# Patient Record
Sex: Female | Born: 1950
Health system: Southern US, Community
[De-identification: ages and names within clinical notes are randomized; demographics above are authoritative.]

## PROBLEM LIST (undated history)

## (undated) DIAGNOSIS — G4730 Sleep apnea, unspecified: Secondary | ICD-10-CM

## (undated) DIAGNOSIS — E7800 Pure hypercholesterolemia, unspecified: Secondary | ICD-10-CM

## (undated) DIAGNOSIS — J189 Pneumonia, unspecified organism: Secondary | ICD-10-CM

## (undated) DIAGNOSIS — D126 Benign neoplasm of colon, unspecified: Secondary | ICD-10-CM

## (undated) DIAGNOSIS — S8992XA Unspecified injury of left lower leg, initial encounter: Secondary | ICD-10-CM

## (undated) DIAGNOSIS — I509 Heart failure, unspecified: Secondary | ICD-10-CM

## (undated) DIAGNOSIS — F419 Anxiety disorder, unspecified: Secondary | ICD-10-CM

## (undated) DIAGNOSIS — D649 Anemia, unspecified: Secondary | ICD-10-CM

## (undated) DIAGNOSIS — J449 Chronic obstructive pulmonary disease, unspecified: Secondary | ICD-10-CM

## (undated) DIAGNOSIS — Z9981 Dependence on supplemental oxygen: Secondary | ICD-10-CM

## (undated) DIAGNOSIS — J45909 Unspecified asthma, uncomplicated: Secondary | ICD-10-CM

## (undated) DIAGNOSIS — E119 Type 2 diabetes mellitus without complications: Secondary | ICD-10-CM

## (undated) DIAGNOSIS — I10 Essential (primary) hypertension: Secondary | ICD-10-CM

## (undated) HISTORY — DX: Heart failure, unspecified: I50.9

## (undated) HISTORY — DX: Pure hypercholesterolemia, unspecified: E78.00

## (undated) HISTORY — PX: CATARACT EXTRACTION: SUR2

## (undated) HISTORY — DX: Benign neoplasm of colon, unspecified: D12.6

## (undated) HISTORY — PX: JOINT REPLACEMENT: SHX530

## (undated) HISTORY — DX: Anxiety disorder, unspecified: F41.9

## (undated) HISTORY — PX: ABDOMINAL HYSTERECTOMY: SHX81

## (undated) HISTORY — DX: Unspecified injury of left lower leg, initial encounter: S89.92XA

## (undated) HISTORY — DX: Anemia, unspecified: D64.9

## (undated) HISTORY — DX: Pneumonia, unspecified organism: J18.9

---

## 2013-09-06 DIAGNOSIS — I10 Essential (primary) hypertension: Secondary | ICD-10-CM | POA: Diagnosis not present

## 2013-09-06 DIAGNOSIS — E559 Vitamin D deficiency, unspecified: Secondary | ICD-10-CM | POA: Diagnosis not present

## 2013-09-06 DIAGNOSIS — E1159 Type 2 diabetes mellitus with other circulatory complications: Secondary | ICD-10-CM | POA: Diagnosis not present

## 2013-09-06 DIAGNOSIS — K219 Gastro-esophageal reflux disease without esophagitis: Secondary | ICD-10-CM | POA: Diagnosis not present

## 2013-09-11 DIAGNOSIS — E559 Vitamin D deficiency, unspecified: Secondary | ICD-10-CM | POA: Diagnosis not present

## 2013-09-11 DIAGNOSIS — I10 Essential (primary) hypertension: Secondary | ICD-10-CM | POA: Diagnosis not present

## 2013-09-11 DIAGNOSIS — E1159 Type 2 diabetes mellitus with other circulatory complications: Secondary | ICD-10-CM | POA: Diagnosis not present

## 2013-09-11 DIAGNOSIS — K219 Gastro-esophageal reflux disease without esophagitis: Secondary | ICD-10-CM | POA: Diagnosis not present

## 2013-09-14 DIAGNOSIS — E559 Vitamin D deficiency, unspecified: Secondary | ICD-10-CM | POA: Diagnosis not present

## 2013-09-14 DIAGNOSIS — Z9109 Other allergy status, other than to drugs and biological substances: Secondary | ICD-10-CM | POA: Diagnosis not present

## 2013-09-18 DIAGNOSIS — R079 Chest pain, unspecified: Secondary | ICD-10-CM | POA: Diagnosis not present

## 2013-09-29 DIAGNOSIS — J301 Allergic rhinitis due to pollen: Secondary | ICD-10-CM | POA: Diagnosis not present

## 2013-09-29 DIAGNOSIS — K219 Gastro-esophageal reflux disease without esophagitis: Secondary | ICD-10-CM | POA: Diagnosis not present

## 2013-09-29 DIAGNOSIS — R062 Wheezing: Secondary | ICD-10-CM | POA: Diagnosis not present

## 2013-09-29 DIAGNOSIS — I10 Essential (primary) hypertension: Secondary | ICD-10-CM | POA: Diagnosis not present

## 2013-10-13 DIAGNOSIS — M1990 Unspecified osteoarthritis, unspecified site: Secondary | ICD-10-CM | POA: Diagnosis not present

## 2013-10-13 DIAGNOSIS — K219 Gastro-esophageal reflux disease without esophagitis: Secondary | ICD-10-CM | POA: Diagnosis not present

## 2013-10-13 DIAGNOSIS — I10 Essential (primary) hypertension: Secondary | ICD-10-CM | POA: Diagnosis not present

## 2013-10-26 DIAGNOSIS — E11319 Type 2 diabetes mellitus with unspecified diabetic retinopathy without macular edema: Secondary | ICD-10-CM | POA: Diagnosis not present

## 2013-10-26 DIAGNOSIS — E1139 Type 2 diabetes mellitus with other diabetic ophthalmic complication: Secondary | ICD-10-CM | POA: Diagnosis not present

## 2013-10-27 DIAGNOSIS — Z01419 Encounter for gynecological examination (general) (routine) without abnormal findings: Secondary | ICD-10-CM | POA: Diagnosis not present

## 2013-11-14 DIAGNOSIS — F458 Other somatoform disorders: Secondary | ICD-10-CM | POA: Diagnosis not present

## 2013-11-14 DIAGNOSIS — I10 Essential (primary) hypertension: Secondary | ICD-10-CM | POA: Diagnosis not present

## 2013-11-14 DIAGNOSIS — Z1211 Encounter for screening for malignant neoplasm of colon: Secondary | ICD-10-CM | POA: Diagnosis not present

## 2013-11-14 DIAGNOSIS — M1990 Unspecified osteoarthritis, unspecified site: Secondary | ICD-10-CM | POA: Diagnosis not present

## 2013-11-14 DIAGNOSIS — K219 Gastro-esophageal reflux disease without esophagitis: Secondary | ICD-10-CM | POA: Diagnosis not present

## 2013-11-14 DIAGNOSIS — Z800 Family history of malignant neoplasm of digestive organs: Secondary | ICD-10-CM | POA: Diagnosis not present

## 2013-11-14 DIAGNOSIS — E1159 Type 2 diabetes mellitus with other circulatory complications: Secondary | ICD-10-CM | POA: Diagnosis not present

## 2013-11-14 DIAGNOSIS — R1310 Dysphagia, unspecified: Secondary | ICD-10-CM | POA: Diagnosis not present

## 2013-11-20 DIAGNOSIS — E669 Obesity, unspecified: Secondary | ICD-10-CM | POA: Diagnosis not present

## 2013-11-20 DIAGNOSIS — G4710 Hypersomnia, unspecified: Secondary | ICD-10-CM | POA: Diagnosis not present

## 2013-11-20 DIAGNOSIS — R0609 Other forms of dyspnea: Secondary | ICD-10-CM | POA: Diagnosis not present

## 2013-11-20 DIAGNOSIS — R062 Wheezing: Secondary | ICD-10-CM | POA: Diagnosis not present

## 2013-11-20 DIAGNOSIS — R0989 Other specified symptoms and signs involving the circulatory and respiratory systems: Secondary | ICD-10-CM | POA: Diagnosis not present

## 2013-11-20 DIAGNOSIS — Z77120 Contact with and (suspected) exposure to mold (toxic): Secondary | ICD-10-CM | POA: Diagnosis not present

## 2013-11-20 DIAGNOSIS — R059 Cough, unspecified: Secondary | ICD-10-CM | POA: Diagnosis not present

## 2013-11-20 DIAGNOSIS — J45909 Unspecified asthma, uncomplicated: Secondary | ICD-10-CM | POA: Diagnosis not present

## 2013-11-20 DIAGNOSIS — R05 Cough: Secondary | ICD-10-CM | POA: Diagnosis not present

## 2013-11-28 DIAGNOSIS — M5137 Other intervertebral disc degeneration, lumbosacral region: Secondary | ICD-10-CM | POA: Diagnosis not present

## 2013-12-12 DIAGNOSIS — J45909 Unspecified asthma, uncomplicated: Secondary | ICD-10-CM | POA: Diagnosis not present

## 2013-12-12 LAB — PULMONARY FUNCTION TEST

## 2013-12-19 DIAGNOSIS — E119 Type 2 diabetes mellitus without complications: Secondary | ICD-10-CM | POA: Diagnosis not present

## 2013-12-19 DIAGNOSIS — E782 Mixed hyperlipidemia: Secondary | ICD-10-CM | POA: Diagnosis not present

## 2013-12-19 DIAGNOSIS — K219 Gastro-esophageal reflux disease without esophagitis: Secondary | ICD-10-CM | POA: Diagnosis not present

## 2013-12-19 DIAGNOSIS — K644 Residual hemorrhoidal skin tags: Secondary | ICD-10-CM | POA: Diagnosis not present

## 2013-12-19 DIAGNOSIS — Z8614 Personal history of Methicillin resistant Staphylococcus aureus infection: Secondary | ICD-10-CM | POA: Diagnosis not present

## 2013-12-19 DIAGNOSIS — Z1211 Encounter for screening for malignant neoplasm of colon: Secondary | ICD-10-CM | POA: Diagnosis not present

## 2013-12-19 DIAGNOSIS — J45909 Unspecified asthma, uncomplicated: Secondary | ICD-10-CM | POA: Diagnosis not present

## 2013-12-19 DIAGNOSIS — I10 Essential (primary) hypertension: Secondary | ICD-10-CM | POA: Diagnosis not present

## 2013-12-19 DIAGNOSIS — K620 Anal polyp: Secondary | ICD-10-CM | POA: Diagnosis not present

## 2013-12-19 DIAGNOSIS — D126 Benign neoplasm of colon, unspecified: Secondary | ICD-10-CM | POA: Diagnosis not present

## 2013-12-19 DIAGNOSIS — R1310 Dysphagia, unspecified: Secondary | ICD-10-CM | POA: Diagnosis not present

## 2013-12-19 DIAGNOSIS — K621 Rectal polyp: Secondary | ICD-10-CM | POA: Diagnosis not present

## 2013-12-19 DIAGNOSIS — Z23 Encounter for immunization: Secondary | ICD-10-CM | POA: Diagnosis not present

## 2013-12-19 DIAGNOSIS — Z6841 Body Mass Index (BMI) 40.0 and over, adult: Secondary | ICD-10-CM | POA: Diagnosis not present

## 2013-12-29 DIAGNOSIS — K621 Rectal polyp: Secondary | ICD-10-CM | POA: Diagnosis not present

## 2013-12-29 DIAGNOSIS — R1310 Dysphagia, unspecified: Secondary | ICD-10-CM | POA: Diagnosis not present

## 2013-12-29 DIAGNOSIS — R6889 Other general symptoms and signs: Secondary | ICD-10-CM | POA: Diagnosis not present

## 2013-12-29 DIAGNOSIS — K219 Gastro-esophageal reflux disease without esophagitis: Secondary | ICD-10-CM | POA: Diagnosis not present

## 2013-12-29 DIAGNOSIS — D126 Benign neoplasm of colon, unspecified: Secondary | ICD-10-CM | POA: Diagnosis not present

## 2013-12-29 DIAGNOSIS — K620 Anal polyp: Secondary | ICD-10-CM | POA: Diagnosis not present

## 2013-12-29 DIAGNOSIS — Z1211 Encounter for screening for malignant neoplasm of colon: Secondary | ICD-10-CM | POA: Diagnosis not present

## 2013-12-29 DIAGNOSIS — K644 Residual hemorrhoidal skin tags: Secondary | ICD-10-CM | POA: Diagnosis not present

## 2014-01-08 DIAGNOSIS — M5137 Other intervertebral disc degeneration, lumbosacral region: Secondary | ICD-10-CM | POA: Diagnosis not present

## 2014-01-12 DIAGNOSIS — Z1211 Encounter for screening for malignant neoplasm of colon: Secondary | ICD-10-CM | POA: Diagnosis not present

## 2014-01-12 DIAGNOSIS — D126 Benign neoplasm of colon, unspecified: Secondary | ICD-10-CM | POA: Diagnosis not present

## 2014-01-12 DIAGNOSIS — R1310 Dysphagia, unspecified: Secondary | ICD-10-CM | POA: Diagnosis not present

## 2014-01-12 DIAGNOSIS — K219 Gastro-esophageal reflux disease without esophagitis: Secondary | ICD-10-CM | POA: Diagnosis not present

## 2014-01-12 DIAGNOSIS — E1159 Type 2 diabetes mellitus with other circulatory complications: Secondary | ICD-10-CM | POA: Diagnosis not present

## 2014-01-12 DIAGNOSIS — E559 Vitamin D deficiency, unspecified: Secondary | ICD-10-CM | POA: Diagnosis not present

## 2014-01-12 DIAGNOSIS — K621 Rectal polyp: Secondary | ICD-10-CM | POA: Diagnosis not present

## 2014-01-12 DIAGNOSIS — I10 Essential (primary) hypertension: Secondary | ICD-10-CM | POA: Diagnosis not present

## 2014-01-12 DIAGNOSIS — K620 Anal polyp: Secondary | ICD-10-CM | POA: Diagnosis not present

## 2014-01-12 DIAGNOSIS — K644 Residual hemorrhoidal skin tags: Secondary | ICD-10-CM | POA: Diagnosis not present

## 2014-01-12 DIAGNOSIS — R6889 Other general symptoms and signs: Secondary | ICD-10-CM | POA: Diagnosis not present

## 2014-01-18 DIAGNOSIS — M5137 Other intervertebral disc degeneration, lumbosacral region: Secondary | ICD-10-CM | POA: Diagnosis not present

## 2014-01-22 DIAGNOSIS — E559 Vitamin D deficiency, unspecified: Secondary | ICD-10-CM | POA: Diagnosis not present

## 2014-01-22 DIAGNOSIS — E1159 Type 2 diabetes mellitus with other circulatory complications: Secondary | ICD-10-CM | POA: Diagnosis not present

## 2014-01-22 DIAGNOSIS — I10 Essential (primary) hypertension: Secondary | ICD-10-CM | POA: Diagnosis not present

## 2014-01-22 DIAGNOSIS — M5137 Other intervertebral disc degeneration, lumbosacral region: Secondary | ICD-10-CM | POA: Diagnosis not present

## 2014-01-29 DIAGNOSIS — Z043 Encounter for examination and observation following other accident: Secondary | ICD-10-CM | POA: Diagnosis not present

## 2014-01-29 DIAGNOSIS — M7989 Other specified soft tissue disorders: Secondary | ICD-10-CM | POA: Diagnosis not present

## 2014-01-29 DIAGNOSIS — M549 Dorsalgia, unspecified: Secondary | ICD-10-CM | POA: Diagnosis not present

## 2014-01-29 DIAGNOSIS — M25569 Pain in unspecified knee: Secondary | ICD-10-CM | POA: Diagnosis not present

## 2014-01-31 DIAGNOSIS — W19XXXS Unspecified fall, sequela: Secondary | ICD-10-CM | POA: Diagnosis not present

## 2014-01-31 DIAGNOSIS — I10 Essential (primary) hypertension: Secondary | ICD-10-CM | POA: Diagnosis not present

## 2014-01-31 DIAGNOSIS — S335XXA Sprain of ligaments of lumbar spine, initial encounter: Secondary | ICD-10-CM | POA: Diagnosis not present

## 2014-01-31 DIAGNOSIS — E1159 Type 2 diabetes mellitus with other circulatory complications: Secondary | ICD-10-CM | POA: Diagnosis not present

## 2014-01-31 DIAGNOSIS — M5137 Other intervertebral disc degeneration, lumbosacral region: Secondary | ICD-10-CM | POA: Diagnosis not present

## 2014-01-31 DIAGNOSIS — IMO0002 Reserved for concepts with insufficient information to code with codable children: Secondary | ICD-10-CM | POA: Diagnosis not present

## 2014-10-23 ENCOUNTER — Other Ambulatory Visit: Payer: Self-pay

## 2014-10-23 DIAGNOSIS — E041 Nontoxic single thyroid nodule: Secondary | ICD-10-CM

## 2014-10-24 ENCOUNTER — Other Ambulatory Visit: Payer: Self-pay

## 2014-10-24 DIAGNOSIS — E049 Nontoxic goiter, unspecified: Secondary | ICD-10-CM

## 2014-11-22 DIAGNOSIS — G43709 Chronic migraine without aura, not intractable, without status migrainosus: Secondary | ICD-10-CM | POA: Diagnosis not present

## 2014-11-25 ENCOUNTER — Emergency Department (HOSPITAL_COMMUNITY): Payer: Medicare Other

## 2014-11-25 ENCOUNTER — Encounter (HOSPITAL_COMMUNITY): Payer: Self-pay | Admitting: Emergency Medicine

## 2014-11-25 ENCOUNTER — Inpatient Hospital Stay (HOSPITAL_COMMUNITY)
Admission: EM | Admit: 2014-11-25 | Discharge: 2014-12-02 | DRG: 202 | Disposition: A | Discharge status: Home / Self Care | Payer: Medicare Other | Attending: Internal Medicine | Admitting: Internal Medicine

## 2014-11-25 DIAGNOSIS — R7989 Other specified abnormal findings of blood chemistry: Secondary | ICD-10-CM

## 2014-11-25 DIAGNOSIS — E1310 Other specified diabetes mellitus with ketoacidosis without coma: Secondary | ICD-10-CM | POA: Diagnosis not present

## 2014-11-25 DIAGNOSIS — E662 Morbid (severe) obesity with alveolar hypoventilation: Secondary | ICD-10-CM | POA: Diagnosis present

## 2014-11-25 DIAGNOSIS — I10 Essential (primary) hypertension: Secondary | ICD-10-CM | POA: Diagnosis not present

## 2014-11-25 DIAGNOSIS — R6510 Systemic inflammatory response syndrome (SIRS) of non-infectious origin without acute organ dysfunction: Secondary | ICD-10-CM | POA: Diagnosis present

## 2014-11-25 DIAGNOSIS — R0602 Shortness of breath: Secondary | ICD-10-CM | POA: Diagnosis not present

## 2014-11-25 DIAGNOSIS — E1122 Type 2 diabetes mellitus with diabetic chronic kidney disease: Secondary | ICD-10-CM | POA: Diagnosis present

## 2014-11-25 DIAGNOSIS — G8929 Other chronic pain: Secondary | ICD-10-CM | POA: Diagnosis present

## 2014-11-25 DIAGNOSIS — M542 Cervicalgia: Secondary | ICD-10-CM

## 2014-11-25 DIAGNOSIS — M545 Low back pain: Secondary | ICD-10-CM | POA: Diagnosis present

## 2014-11-25 DIAGNOSIS — J810 Acute pulmonary edema: Secondary | ICD-10-CM | POA: Diagnosis not present

## 2014-11-25 DIAGNOSIS — Z79891 Long term (current) use of opiate analgesic: Secondary | ICD-10-CM

## 2014-11-25 DIAGNOSIS — R6520 Severe sepsis without septic shock: Secondary | ICD-10-CM

## 2014-11-25 DIAGNOSIS — Z79899 Other long term (current) drug therapy: Secondary | ICD-10-CM | POA: Diagnosis not present

## 2014-11-25 DIAGNOSIS — E781 Pure hyperglyceridemia: Secondary | ICD-10-CM | POA: Diagnosis present

## 2014-11-25 DIAGNOSIS — N179 Acute kidney failure, unspecified: Secondary | ICD-10-CM | POA: Diagnosis not present

## 2014-11-25 DIAGNOSIS — Z6841 Body Mass Index (BMI) 40.0 and over, adult: Secondary | ICD-10-CM

## 2014-11-25 DIAGNOSIS — N189 Chronic kidney disease, unspecified: Secondary | ICD-10-CM

## 2014-11-25 DIAGNOSIS — I129 Hypertensive chronic kidney disease with stage 1 through stage 4 chronic kidney disease, or unspecified chronic kidney disease: Secondary | ICD-10-CM | POA: Diagnosis present

## 2014-11-25 DIAGNOSIS — N182 Chronic kidney disease, stage 2 (mild): Secondary | ICD-10-CM | POA: Diagnosis present

## 2014-11-25 DIAGNOSIS — E1165 Type 2 diabetes mellitus with hyperglycemia: Secondary | ICD-10-CM | POA: Diagnosis not present

## 2014-11-25 DIAGNOSIS — R05 Cough: Secondary | ICD-10-CM | POA: Diagnosis not present

## 2014-11-25 DIAGNOSIS — E872 Acidosis: Secondary | ICD-10-CM | POA: Diagnosis present

## 2014-11-25 DIAGNOSIS — R509 Fever, unspecified: Secondary | ICD-10-CM | POA: Diagnosis not present

## 2014-11-25 DIAGNOSIS — G4733 Obstructive sleep apnea (adult) (pediatric): Secondary | ICD-10-CM | POA: Diagnosis present

## 2014-11-25 DIAGNOSIS — I248 Other forms of acute ischemic heart disease: Secondary | ICD-10-CM | POA: Diagnosis present

## 2014-11-25 DIAGNOSIS — J189 Pneumonia, unspecified organism: Secondary | ICD-10-CM | POA: Diagnosis present

## 2014-11-25 DIAGNOSIS — Z90710 Acquired absence of both cervix and uterus: Secondary | ICD-10-CM | POA: Diagnosis not present

## 2014-11-25 DIAGNOSIS — A419 Sepsis, unspecified organism: Secondary | ICD-10-CM | POA: Diagnosis present

## 2014-11-25 DIAGNOSIS — R778 Other specified abnormalities of plasma proteins: Secondary | ICD-10-CM | POA: Diagnosis present

## 2014-11-25 DIAGNOSIS — J9811 Atelectasis: Secondary | ICD-10-CM | POA: Diagnosis present

## 2014-11-25 DIAGNOSIS — J45901 Unspecified asthma with (acute) exacerbation: Principal | ICD-10-CM | POA: Diagnosis present

## 2014-11-25 DIAGNOSIS — I5033 Acute on chronic diastolic (congestive) heart failure: Secondary | ICD-10-CM | POA: Diagnosis not present

## 2014-11-25 DIAGNOSIS — R1909 Other intra-abdominal and pelvic swelling, mass and lump: Secondary | ICD-10-CM | POA: Diagnosis not present

## 2014-11-25 DIAGNOSIS — Z794 Long term (current) use of insulin: Secondary | ICD-10-CM

## 2014-11-25 DIAGNOSIS — I517 Cardiomegaly: Secondary | ICD-10-CM | POA: Diagnosis not present

## 2014-11-25 DIAGNOSIS — J811 Chronic pulmonary edema: Secondary | ICD-10-CM

## 2014-11-25 DIAGNOSIS — M5032 Other cervical disc degeneration, mid-cervical region: Secondary | ICD-10-CM | POA: Diagnosis not present

## 2014-11-25 DIAGNOSIS — J80 Acute respiratory distress syndrome: Secondary | ICD-10-CM | POA: Diagnosis not present

## 2014-11-25 DIAGNOSIS — R1011 Right upper quadrant pain: Secondary | ICD-10-CM

## 2014-11-25 DIAGNOSIS — R0603 Acute respiratory distress: Secondary | ICD-10-CM | POA: Diagnosis present

## 2014-11-25 DIAGNOSIS — R609 Edema, unspecified: Secondary | ICD-10-CM | POA: Diagnosis not present

## 2014-11-25 DIAGNOSIS — I509 Heart failure, unspecified: Secondary | ICD-10-CM

## 2014-11-25 DIAGNOSIS — E785 Hyperlipidemia, unspecified: Secondary | ICD-10-CM | POA: Diagnosis present

## 2014-11-25 HISTORY — DX: Essential (primary) hypertension: I10

## 2014-11-25 HISTORY — DX: Type 2 diabetes mellitus without complications: E11.9

## 2014-11-25 HISTORY — DX: Unspecified asthma, uncomplicated: J45.909

## 2014-11-25 LAB — BASIC METABOLIC PANEL
Anion gap: 10 (ref 5–15)
BUN: 16 mg/dL (ref 6–20)
CHLORIDE: 102 mmol/L (ref 101–111)
CO2: 24 mmol/L (ref 22–32)
Calcium: 9.5 mg/dL (ref 8.9–10.3)
Creatinine, Ser: 1.49 mg/dL — ABNORMAL HIGH (ref 0.44–1.00)
GFR calc non Af Amer: 36 mL/min — ABNORMAL LOW (ref >60)
GFR, EST AFRICAN AMERICAN: 42 mL/min — AB (ref >60)
Glucose, Bld: 344 mg/dL — ABNORMAL HIGH (ref 65–99)
Potassium: 3.8 mmol/L (ref 3.5–5.1)
Sodium: 136 mmol/L (ref 135–145)

## 2014-11-25 LAB — CBC
HCT: 41.2 % (ref 36.0–46.0)
HEMOGLOBIN: 13.1 g/dL (ref 12.0–15.0)
MCH: 27.2 pg (ref 26.0–34.0)
MCHC: 31.8 g/dL (ref 30.0–36.0)
MCV: 85.5 fL (ref 78.0–100.0)
PLATELETS: 207 10*3/uL (ref 150–400)
RBC: 4.82 MIL/uL (ref 3.87–5.11)
RDW: 14.5 % (ref 11.5–15.5)
WBC: 7.8 10*3/uL (ref 4.0–10.5)

## 2014-11-25 LAB — URINALYSIS, ROUTINE W REFLEX MICROSCOPIC
Bilirubin Urine: NEGATIVE
Glucose, UA: >1000 mg/dL — AB
Hgb urine dipstick: NEGATIVE
Ketones, ur: 15 mg/dL — AB
LEUKOCYTES UA: NEGATIVE
Nitrite: NEGATIVE
PROTEIN: NEGATIVE mg/dL
Specific Gravity, Urine: 1.035 — ABNORMAL HIGH (ref 1.005–1.030)
Urobilinogen, UA: 1 mg/dL (ref 0.0–1.0)
pH: 5.5 (ref 5.0–8.0)

## 2014-11-25 LAB — I-STAT ARTERIAL BLOOD GAS, ED
Acid-Base Excess: 1 mmol/L (ref 0.0–2.0)
Bicarbonate: 24.4 mEq/L — ABNORMAL HIGH (ref 20.0–24.0)
O2 SAT: 96 %
Patient temperature: 99.4
TCO2: 25 mmol/L (ref 0–100)
pCO2 arterial: 36.9 mmHg (ref 35.0–45.0)
pH, Arterial: 7.431 (ref 7.350–7.450)
pO2, Arterial: 78 mmHg — ABNORMAL LOW (ref 80.0–100.0)

## 2014-11-25 LAB — I-STAT TROPONIN, ED: Troponin i, poc: 0 ng/mL (ref 0.00–0.08)

## 2014-11-25 LAB — TROPONIN I

## 2014-11-25 LAB — URINE MICROSCOPIC-ADD ON: Urine-Other: NONE SEEN

## 2014-11-25 LAB — I-STAT CG4 LACTIC ACID, ED
LACTIC ACID, VENOUS: 2.79 mmol/L — AB (ref 0.5–2.0)
LACTIC ACID, VENOUS: 2.69 mmol/L — AB (ref 0.5–2.0)

## 2014-11-25 LAB — BRAIN NATRIURETIC PEPTIDE: B NATRIURETIC PEPTIDE 5: 22.9 pg/mL (ref 0.0–100.0)

## 2014-11-25 MED ORDER — LEVOFLOXACIN IN D5W 500 MG/100ML IV SOLN
500.0000 mg | Freq: Once | INTRAVENOUS | Status: AC
  Administered 2014-11-25: 500 mg via INTRAVENOUS
  Filled 2014-11-25: qty 100

## 2014-11-25 MED ORDER — SODIUM CHLORIDE 0.9 % IV BOLUS (SEPSIS)
1000.0000 mL | Freq: Once | INTRAVENOUS | Status: AC
  Administered 2014-11-25: 1000 mL via INTRAVENOUS

## 2014-11-25 MED ORDER — MAGNESIUM SULFATE 2 GM/50ML IV SOLN
2.0000 g | Freq: Once | INTRAVENOUS | Status: AC
  Administered 2014-11-26: 2 g via INTRAVENOUS
  Filled 2014-11-25: qty 50

## 2014-11-25 MED ORDER — LEVALBUTEROL HCL 1.25 MG/0.5ML IN NEBU
1.2500 mg | INHALATION_SOLUTION | Freq: Once | RESPIRATORY_TRACT | Status: AC
  Administered 2014-11-25: 1.25 mg via RESPIRATORY_TRACT
  Filled 2014-11-25: qty 0.5

## 2014-11-25 MED ORDER — IPRATROPIUM-ALBUTEROL 0.5-2.5 (3) MG/3ML IN SOLN
3.0000 mL | Freq: Once | RESPIRATORY_TRACT | Status: AC
  Administered 2014-11-25: 3 mL via RESPIRATORY_TRACT
  Filled 2014-11-25: qty 3

## 2014-11-25 MED ORDER — METHYLPREDNISOLONE SODIUM SUCC 125 MG IJ SOLR
125.0000 mg | Freq: Once | INTRAMUSCULAR | Status: AC
  Administered 2014-11-25: 125 mg via INTRAVENOUS
  Filled 2014-11-25: qty 2

## 2014-11-25 MED ORDER — ALBUTEROL (5 MG/ML) CONTINUOUS INHALATION SOLN
10.0000 mg/h | INHALATION_SOLUTION | RESPIRATORY_TRACT | Status: DC
  Administered 2014-11-25: 10 mg/h via RESPIRATORY_TRACT
  Filled 2014-11-25: qty 20

## 2014-11-25 NOTE — ED Notes (Signed)
NOTIFIED DR. RANCOU IN PERSON FOR PATIENTS LAB RESULTS OF CG4+LACTIC ACID @18 :38 PM ,11/25/2014.

## 2014-11-25 NOTE — ED Provider Notes (Signed)
CSN: [id number]     Arrival date & time 11/25/14  1753 History   First MD Initiated Contact with Patient 11/25/14 1858     Chief Complaint  Patient presents with  . Cough  . Shortness of Breath  . Fever     (Consider location/radiation/quality/duration/timing/severity/associated sxs/prior Treatment) HPI Comments: Patient reports three-day history of cough, shortness of breath, subjective fever. She has a history of asthma, diabetes, hypertension and CHF. She recently relocated from [place]. She denies any chest pain, abdominal pain, nausea or vomiting. She does not smoke. She reports that she been having difficulty breathing, rhinorrhea, congestion. Cough is productive of green-yellow mucus. She has not had any inhalers to use at home. She denies any recent travel or sick contacts. She reports being hospitalized for her breathing in the past.  Patient is a 64 y.o. female presenting with shortness of breath and fever. The history is provided by the patient. The history is limited by the condition of the patient.  Shortness of Breath Associated symptoms: cough and fever   Associated symptoms: no abdominal pain, no headaches, no rash and no vomiting   Fever Associated symptoms: congestion, cough and rhinorrhea   Associated symptoms: no dysuria, no headaches, no myalgias, no nausea, no rash and no vomiting     Past Medical History  Diagnosis Date  . Asthma   . Hypertension   . Diabetes mellitus without complication    Past Surgical History  Procedure Laterality Date  . Abdominal hysterectomy     Family History  Problem Relation Age of Onset  . Colon cancer Father   . Diabetes type II Sister   . Diabetes type II Brother   . Colon cancer Sister    History  Substance Use Topics  . Smoking status: Never Smoker   . Smokeless tobacco: Not on file  . Alcohol Use: No   OB History    No data available     Review of Systems  Constitutional: Positive for fever, activity change  and appetite change.  HENT: Positive for congestion and rhinorrhea.   Respiratory: Positive for cough, chest tightness and shortness of breath.   Gastrointestinal: Negative for nausea, vomiting and abdominal pain.  Genitourinary: Negative for dysuria, hematuria, vaginal bleeding and vaginal discharge.  Musculoskeletal: Negative for myalgias and arthralgias.  Skin: Negative for rash.  Neurological: Positive for weakness. Negative for dizziness and headaches.  A complete 10 system review of systems was obtained and all systems are negative except as noted in the HPI and PMH.      Allergies  Review of patient's allergies indicates no known allergies.  Home Medications   Prior to Admission medications   Medication Sig Start Date End Date Taking? Authorizing Provider  albuterol (PROVENTIL HFA;VENTOLIN HFA) 108 (90 BASE) MCG/ACT inhaler Inhale 2 puffs into the lungs every 6 (six) hours as needed for wheezing or shortness of breath.    Yes Historical Provider, MD  aspirin 81 MG chewable tablet 81 mg daily.   Yes Historical Provider, MD  atenolol (TENORMIN) 50 MG tablet Take 50 mg by mouth 2 (two) times daily. 11/15/14  Yes Historical Provider, MD  atorvastatin (LIPITOR) 40 MG tablet Take 40 mg by mouth daily at 6 PM.  09/07/14  Yes Historical Provider, MD  benzonatate (TESSALON) 100 MG capsule Take 100 mg by mouth daily as needed for cough.  11/22/14  Yes Historical Provider, MD  bumetanide (BUMEX) 0.5 MG tablet Take 0.5 mg by mouth daily.   Yes  Historical Provider, MD  calcium-vitamin D (OSCAL WITH D) 500-200 MG-UNIT per tablet Take 1 tablet by mouth daily. 03/02/14  Yes Historical Provider, MD  Fluticasone-Salmeterol (ADVAIR) 250-50 MCG/DOSE AEPB Inhale 1 puff into the lungs 2 (two) times daily. 10/15/14  Yes Historical Provider, MD  guaifenesin (ROBITUSSIN) 100 MG/5ML syrup Take 200 mg by mouth 3 (three) times daily as needed for cough.   Yes Historical Provider, MD  HUMULIN 70/30 (70-30) 100  UNIT/ML injection Inject 75 Units into the skin 3 (three) times daily. 11/05/14  Yes Historical Provider, MD  hydrochlorothiazide (HYDRODIURIL) 25 MG tablet Take 25 mg by mouth daily. 11/01/14  Yes Historical Provider, MD  HYDROcodone-acetaminophen (NORCO) 7.5-325 MG per tablet Take 1 tablet by mouth every 6 (six) hours as needed for moderate pain or severe pain.  11/19/14  Yes Historical Provider, MD  lisinopril (PRINIVIL,ZESTRIL) 5 MG tablet Take 5 mg by mouth daily. 11/01/14  Yes Historical Provider, MD  loratadine (CLARITIN) 10 MG tablet Take 10 mg by mouth daily. 10/10/14  Yes Historical Provider, MD  montelukast (SINGULAIR) 10 MG tablet Take 10 mg by mouth daily. 11/01/14  Yes Historical Provider, MD  nitroGLYCERIN (NITROSTAT) 0.3 MG SL tablet Place 0.3 mg under the tongue every 5 (five) minutes as needed for chest pain.    Yes Historical Provider, MD  omega-3 acid ethyl esters (LOVAZA) 1 G capsule Take 1,000 mg by mouth 2 (two) times daily.   Yes Historical Provider, MD  omeprazole (PRILOSEC) 40 MG capsule Take 40 mg by mouth daily. 10/30/14  Yes Historical Provider, MD  potassium chloride (K-DUR) 10 MEQ tablet Take 10 mEq by mouth daily.  07/30/14  Yes Historical Provider, MD  topiramate (TOPAMAX) 50 MG tablet Take 50 mg by mouth 2 (two) times daily. 11/01/14  Yes Historical Provider, MD   BP 132/68 mmHg  Pulse 118  Temp(Src) 98.8 F (37.1 C) (Oral)  Resp 35  Ht 5\' 3"  (1.6 m)  Wt 261 lb 14.5 oz (118.8 kg)  BMI 46.41 kg/m2  SpO2 93% Physical Exam  Constitutional: She is oriented to person, place, and time. She appears well-developed and well-nourished. She appears distressed.  Mildly increased work of breathing  HENT:  Head: Normocephalic and atraumatic.  Mouth/Throat: Oropharynx is clear and moist. No oropharyngeal exudate.  Eyes: Conjunctivae and EOM are normal. Pupils are equal, round, and reactive to light.  Neck: Normal range of motion. Neck supple.  No meningismus.  Cardiovascular:  Normal rate, regular rhythm, normal heart sounds and intact distal pulses.   No murmur heard. Pulmonary/Chest: She is in respiratory distress. She has wheezes.  Tachypnea, expiratory wheezing bilaterally Decreased breath sounds  Abdominal: Soft. There is no tenderness. There is no rebound and no guarding.  Musculoskeletal: Normal range of motion. She exhibits no edema or tenderness.  Neurological: She is alert and oriented to person, place, and time. No cranial nerve deficit. She exhibits normal muscle tone. Coordination normal.  No ataxia on finger to nose bilaterally. No pronator drift. 5/5 strength throughout. CN 2-12 intact. Negative Romberg. Equal grip strength. Sensation intact. Gait is normal.   Skin: Skin is warm.  Psychiatric: She has a normal mood and affect. Her behavior is normal.  Nursing note and vitals reviewed.   ED Course  Procedures (including critical care time) Labs Review Labs Reviewed  BASIC METABOLIC PANEL - Abnormal; Notable for the following:    Glucose, Bld 344 (*)    Creatinine, Ser 1.49 (*)    GFR calc non  Af Amer 36 (*)    GFR calc Af Amer 42 (*)    All other components within normal limits  URINALYSIS, ROUTINE W REFLEX MICROSCOPIC - Abnormal; Notable for the following:    Specific Gravity, Urine 1.035 (*)    Glucose, UA >1000 (*)    Ketones, ur 15 (*)    All other components within normal limits  I-STAT CG4 LACTIC ACID, ED - Abnormal; Notable for the following:    Lactic Acid, Venous 2.79 (*)    All other components within normal limits  I-STAT ARTERIAL BLOOD GAS, ED - Abnormal; Notable for the following:    pO2, Arterial 78.0 (*)    Bicarbonate 24.4 (*)    All other components within normal limits  I-STAT CG4 LACTIC ACID, ED - Abnormal; Notable for the following:    Lactic Acid, Venous 2.69 (*)    All other components within normal limits  I-STAT CG4 LACTIC ACID, ED - Abnormal; Notable for the following:    Lactic Acid, Venous 5.40 (*)    All  other components within normal limits  URINE CULTURE  CULTURE, BLOOD (ROUTINE X 2)  CULTURE, BLOOD (ROUTINE X 2)  CULTURE, EXPECTORATED SPUTUM-ASSESSMENT  CBC  TROPONIN I  BRAIN NATRIURETIC PEPTIDE  URINE MICROSCOPIC-ADD ON  D-DIMER, QUANTITATIVE  PROCALCITONIN  INFLUENZA PANEL BY PCR (TYPE A & B, H1N1)  LACTIC ACID, PLASMA  MAGNESIUM  PHOSPHORUS  TSH  COMPREHENSIVE METABOLIC PANEL  CBC  TROPONIN I  TROPONIN I  TROPONIN I  I-STAT TROPOININ, ED    Imaging Review Dg Chest 2 View (if Patient Has Fever And/or Copd)  11/25/2014   CLINICAL DATA:  Patient with cough and congestion as well as fever for multiple days.  EXAM: CHEST  2 VIEW  COMPARISON:  None.  FINDINGS: Cardiac and mediastinal contours are enlarged. No consolidative pulmonary opacities. No pleural effusion or pneumothorax. Regional skeleton is unremarkable.  IMPRESSION: Cardiomegaly.  No acute cardiopulmonary process.   Electronically Signed   By: Lovey Newcomer M.D.   On: 11/25/2014 18:30     EKG Interpretation None      MDM   Final diagnoses:  Asthma exacerbation   Patient presents with cough and respiratory distress and difficulty breathing for the past 3 days. She is wheezing and tachypnea, exam.  Nebulizers and steroids are ordered. Magnesium 2g IV.  Chest x-ray shows cardiomegaly no pneumonia or acute process. Remains tachypneic, tachycardic. Not hypoxic. ABG without CO2 retention.  Will not start bipap at this time. Lactate elevated.  IVF given.  Levaquin given.  Unknown EF with reported history of CHF. Afebrile.  D-dimer negative. Suspect asthma exacerbation with possible viral bronchitis.  Still in respiratory distress, moving better air, wheezing throughout. Will need admission. Stepdown admission d/w Dr. Roel Cluck.  She has asked critical care to consult.   CRITICAL CARE Performed by: Ezequiel Essex Total critical care time:60 Critical care time was exclusive of separately billable procedures and  treating other patients. Critical care was necessary to treat or prevent imminent or life-threatening deterioration. Critical care was time spent personally by me on the following activities: development of treatment plan with patient and/or surrogate as well as nursing, discussions with consultants, evaluation of patient's response to treatment, examination of patient, obtaining history from patient or surrogate, ordering and performing treatments and interventions, ordering and review of laboratory studies, ordering and review of radiographic studies, pulse oximetry and re-evaluation of patient's condition.    Ezequiel Essex, MD 11/26/14 [phone]

## 2014-11-25 NOTE — ED Notes (Signed)
RT spoke with MD in regards to ABG results below and placing patient on BiPAP based on the results. Patient was wheezing while RT was in room obtaining ABG .  MD agreed to try a continuous neb treatment first. RT will continue to monitor.   Results for Heidi [name], Heidi [name] (MRN [id number]) as of 11/25/2014 20:43  Ref. Range 11/25/2014 20:36  Sample type Unknown ARTERIAL  pH, Arterial Latest Ref Range: 7.350-7.450  7.431  pCO2 arterial Latest Ref Range: 35.0-45.0 mmHg 36.9  pO2, Arterial Latest Ref Range: 80.0-100.0 mmHg 78.0 (L)  Bicarbonate Latest Ref Range: 20.0-24.0 mEq/L 24.4 (H)  TCO2 Latest Ref Range: 0-100 mmol/L 25  Acid-Base Excess Latest Ref Range: 0.0-2.0 mmol/L 1.0  O2 Saturation Latest Units: % 96.0  Patient temperature Unknown 99.4 F  Collection site Unknown RADIAL, [facility] T.Marland KitchenMarland Kitchen

## 2014-11-25 NOTE — ED Notes (Signed)
[facility] notified to call Code Sepsis Level 1

## 2014-11-25 NOTE — ED Notes (Signed)
Pt placed on 2L O2 

## 2014-11-25 NOTE — ED Notes (Signed)
Pt c/o cough, shortness of breath and fever onset Friday. Pt has tried OTC medications and prescribed medications without relief. Pt talking in near complete sentences.

## 2014-11-25 NOTE — H&P (Addendum)
PCP: Patient moved to [facility] in the beginning of April haven't gotten a PCP  Referring provider [place]   Chief Complaint:  Wheezing shortness of breath  HPI: Heidi [name] is a 64 y.o. female   has a past medical history of Asthma; Hypertension; and Diabetes mellitus without complication.   Presented with worsening cough shortness of breath patient endorses fever up to 100.5.She has been coughing for the past 3 days. She had to drive back to [place] few days ago and was seen by her PCP. He prescribed tessalon pearls.  Has history of asthma and takes albuterol as needed. Does not have Nebulizer at home. History of heart failure on Bumex  and diabetes on insulin.  Patient afebrile in emergency department lactic acid noted to be elevated at 2.69, patient is tachycardic and tachypneic was treated with albuterol nebulizer and IV Solu-Medrol with some response but continues to be severely short of breath d-dimer has been ordered and currently pending chest x-ray showing cardiomegaly but no acute findings. In ER she met sepsis criteria with tachypnea, tachycardia and hx of fever up to 100.5  Hospitalist was called for admission for likely asthma exacerbation and possibly viral illness  Review of Systems:    Pertinent positives include: Fevers, chills, shortness of breath at rest. wheezing.productive cough,  Constitutional:  No weight loss, night sweats, , fatigue, weight loss,  HEENT:  No headaches, Difficulty swallowing,Tooth/dental problems,Sore throat,  No sneezing, itching, ear ache, nasal congestion, post nasal drip,  Cardio-vascular:  No chest pain, Orthopnea, PND, anasarca, dizziness, palpitations.no Bilateral lower extremity swelling  GI:  No heartburn, indigestion, abdominal pain, nausea, vomiting, diarrhea, change in bowel habits, loss of appetite, melena, blood in stool, hematemesis Resp:  no  No dyspnea on exertion, No excess mucus,   No non-productive cough, No coughing  up of blood.No change in color of mucus.No  Skin:  no rash or lesions. No jaundice GU:  no dysuria, change in color of urine, no urgency or frequency. No straining to urinate.  No flank pain.  Musculoskeletal:  No joint pain or no joint swelling. No decreased range of motion. No back pain.  Psych:  No change in mood or affect. No depression or anxiety. No memory loss.  Neuro: no localizing neurological complaints, no tingling, no weakness, no double vision, no gait abnormality, no slurred speech, no confusion  Otherwise ROS are negative except for above, 10 systems were reviewed  Past Medical History: Past Medical History  Diagnosis Date  . Asthma   . Hypertension   . Diabetes mellitus without complication    Past Surgical History  Procedure Laterality Date  . Abdominal hysterectomy       Medications: Prior to Admission medications   Medication Sig Start Date End Date Taking? Authorizing Provider  albuterol (PROVENTIL HFA;VENTOLIN HFA) 108 (90 BASE) MCG/ACT inhaler Inhale 2 puffs into the lungs every 6 (six) hours as needed for wheezing or shortness of breath.    Yes Historical Provider, MD  aspirin 81 MG chewable tablet 81 mg daily.   Yes Historical Provider, MD  atenolol (TENORMIN) 50 MG tablet Take 50 mg by mouth 2 (two) times daily. 11/15/14  Yes Historical Provider, MD  atorvastatin (LIPITOR) 40 MG tablet Take 40 mg by mouth daily at 6 PM.  09/07/14  Yes Historical Provider, MD  benzonatate (TESSALON) 100 MG capsule Take 100 mg by mouth daily as needed for cough.  11/22/14  Yes Historical Provider, MD  bumetanide (BUMEX) 0.5 MG tablet  Take 0.5 mg by mouth daily.   Yes Historical Provider, MD  calcium-vitamin D (OSCAL WITH D) 500-200 MG-UNIT per tablet Take 1 tablet by mouth daily. 03/02/14  Yes Historical Provider, MD  Fluticasone-Salmeterol (ADVAIR) 250-50 MCG/DOSE AEPB Inhale 1 puff into the lungs 2 (two) times daily. 10/15/14  Yes Historical Provider, MD  guaifenesin  (ROBITUSSIN) 100 MG/5ML syrup Take 200 mg by mouth 3 (three) times daily as needed for cough.   Yes Historical Provider, MD  HUMULIN 70/30 (70-30) 100 UNIT/ML injection Inject 75 Units into the skin 3 (three) times daily. 11/05/14  Yes Historical Provider, MD  hydrochlorothiazide (HYDRODIURIL) 25 MG tablet Take 25 mg by mouth daily. 11/01/14  Yes Historical Provider, MD  HYDROcodone-acetaminophen (NORCO) 7.5-325 MG per tablet Take 1 tablet by mouth every 6 (six) hours as needed for moderate pain or severe pain.  11/19/14  Yes Historical Provider, MD  lisinopril (PRINIVIL,ZESTRIL) 5 MG tablet Take 5 mg by mouth daily. 11/01/14  Yes Historical Provider, MD  loratadine (CLARITIN) 10 MG tablet Take 10 mg by mouth daily. 10/10/14  Yes Historical Provider, MD  montelukast (SINGULAIR) 10 MG tablet Take 10 mg by mouth daily. 11/01/14  Yes Historical Provider, MD  nitroGLYCERIN (NITROSTAT) 0.3 MG SL tablet Place 0.3 mg under the tongue every 5 (five) minutes as needed for chest pain.    Yes Historical Provider, MD  omega-3 acid ethyl esters (LOVAZA) 1 G capsule Take 1,000 mg by mouth 2 (two) times daily.   Yes Historical Provider, MD  omeprazole (PRILOSEC) 40 MG capsule Take 40 mg by mouth daily. 10/30/14  Yes Historical Provider, MD  potassium chloride (K-DUR) 10 MEQ tablet Take 10 mEq by mouth daily.  07/30/14  Yes Historical Provider, MD  topiramate (TOPAMAX) 50 MG tablet Take 50 mg by mouth 2 (two) times daily. 11/01/14  Yes Historical Provider, MD    Allergies:  No Known Allergies  Social History:  Ambulatory   independently   cane Lives at home  With family     reports that she has never smoked. She does not have any smokeless tobacco history on file. She reports that she does not drink alcohol or use illicit drugs.    Family History: family history includes Colon cancer in her father and sister; Diabetes type II in her brother and sister.    Physical Exam: Patient Vitals for the past 24 hrs:  BP  Temp Temp src Pulse Resp SpO2 Height Weight  11/25/14 2145 135/69 mmHg - - (!) 132 (!) 33 98 % - -  11/25/14 2115 (!) 140/46 mmHg - - 115 26 98 % - -  11/25/14 2100 123/77 mmHg - - 105 14 99 % - -  11/25/14 2049 - - - - - 94 % - -  11/25/14 2045 - - - - (!) 32 - - 119.477 kg (263 lb 6.4 oz)  11/25/14 2030 112/88 mmHg - - 109 - 95 % - -  11/25/14 2015 129/86 mmHg - - 113 - 95 % - -  11/25/14 2000 (!) 135/52 mmHg - - 109 22 94 % - -  11/25/14 1950 158/83 mmHg - - 118 18 98 % - -  11/25/14 1930 - - - - (!) 37 - - -  11/25/14 1916 - - - - - 93 % - -  11/25/14 1915 158/83 mmHg - - 107 - 92 % - -  11/25/14 1803 (!) 151/47 mmHg - - 105 25 95 % 5' 3" (1.6 m)  113.399 kg (250 lb)  11/25/14 1802 - 99.4 F (37.4 C) Oral - - - - -    1. General:  in No Acute distress 2. Psychological: Alert and  Oriented 3. Head/ENT:     Dry Mucous Membranes                          Head Non traumatic, neck supple                          Normal Dentition 4. SKIN:  decreased Skin turgor,  Skin clean Dry and intact no rash 5. Heart: Regular rate and rhythm no Murmur, Rub or gallop 6. Lungs: some wheezes no crackles, decreased air movement 7. Abdomen: Soft, non-tender, Non distended 8. Lower extremities: no clubbing, cyanosis, or edema 9. Neurologically Grossly intact, moving all 4 extremities equally 10. MSK: Normal range of motion  body mass index is 46.67 kg/(m^2).   Labs on Admission:   Results for orders placed or performed during the hospital encounter of 11/25/14 (from the past 24 hour(s))  CBC     Status: None   Collection Time: 11/25/14  6:16 PM  Result Value Ref Range   WBC 7.8 4.0 - 10.5 K/uL   RBC 4.82 3.87 - 5.11 MIL/uL   Hemoglobin 13.1 12.0 - 15.0 g/dL   HCT 41.2 36.0 - 46.0 %   MCV 85.5 78.0 - 100.0 fL   MCH 27.2 26.0 - 34.0 pg   MCHC 31.8 30.0 - 36.0 g/dL   RDW 14.5 11.5 - 15.5 %   Platelets 207 150 - 400 K/uL  Basic metabolic panel     Status: Abnormal   Collection Time: 11/25/14   6:16 PM  Result Value Ref Range   Sodium 136 135 - 145 mmol/L   Potassium 3.8 3.5 - 5.1 mmol/L   Chloride 102 101 - 111 mmol/L   CO2 24 22 - 32 mmol/L   Glucose, Bld 344 (H) 65 - 99 mg/dL   BUN 16 6 - 20 mg/dL   Creatinine, Ser 1.49 (H) 0.44 - 1.00 mg/dL   Calcium 9.5 8.9 - 10.3 mg/dL   GFR calc non Af Amer 36 (L) >60 mL/min   GFR calc Af Amer 42 (L) >60 mL/min   Anion gap 10 5 - 15  Troponin I     Status: None   Collection Time: 11/25/14  6:16 PM  Result Value Ref Range   Troponin I <0.03 <0.031 ng/mL  Brain natriuretic peptide     Status: None   Collection Time: 11/25/14  6:16 PM  Result Value Ref Range   B Natriuretic Peptide 22.9 0.0 - 100.0 pg/mL  I-Stat CG4 Lactic Acid, ED  (not at [facility])     Status: Abnormal   Collection Time: 11/25/14  6:30 PM  Result Value Ref Range   Lactic Acid, Venous 2.79 (HH) 0.5 - 2.0 mmol/L   Comment NOTIFIED PHYSICIAN   Urinalysis with microscopic     Status: Abnormal   Collection Time: 11/25/14  7:47 PM  Result Value Ref Range   Color, Urine YELLOW YELLOW   APPearance CLEAR CLEAR   Specific Gravity, Urine 1.035 (H) 1.005 - 1.030   pH 5.5 5.0 - 8.0   Glucose, UA >1000 (A) NEGATIVE mg/dL   Hgb urine dipstick NEGATIVE NEGATIVE   Bilirubin Urine NEGATIVE NEGATIVE   Ketones, ur 15 (A) NEGATIVE mg/dL   Protein, ur  NEGATIVE NEGATIVE mg/dL   Urobilinogen, UA 1.0 0.0 - 1.0 mg/dL   Nitrite NEGATIVE NEGATIVE   Leukocytes, UA NEGATIVE NEGATIVE  Urine microscopic-add on     Status: None   Collection Time: 11/25/14  7:47 PM  Result Value Ref Range   Urine-Other      NO FORMED ELEMENTS SEEN ON URINE MICROSCOPIC EXAMINATION  I-Stat Arterial Blood Gas, ED - (order at [facility] and [facility] only)     Status: Abnormal   Collection Time: 11/25/14  8:36 PM  Result Value Ref Range   pH, Arterial 7.431 7.350 - 7.450   pCO2 arterial 36.9 35.0 - 45.0 mmHg   pO2, Arterial 78.0 (L) 80.0 - 100.0 mmHg   Bicarbonate 24.4 (H) 20.0 - 24.0 mEq/L   TCO2 25 0 - 100 mmol/L    O2 Saturation 96.0 %   Acid-Base Excess 1.0 0.0 - 2.0 mmol/L   Patient temperature 99.4 F    Collection site RADIAL, ALLEN'S TEST ACCEPTABLE    Drawn by RT    Sample type ARTERIAL   I-stat troponin, ED (not at [facility], [facility])     Status: None   Collection Time: 11/25/14  8:42 PM  Result Value Ref Range   Troponin i, poc 0.00 0.00 - 0.08 ng/mL   Comment 3          I-Stat CG4 Lactic Acid, ED  (not at [facility])     Status: Abnormal   Collection Time: 11/25/14  8:44 PM  Result Value Ref Range   Lactic Acid, Venous 2.69 (HH) 0.5 - 2.0 mmol/L   Comment NOTIFIED PHYSICIAN     UA no evidence of UTi  No results found for: HGBA1C  Estimated Creatinine Clearance: 48.9 mL/min (by C-G formula based on Cr of 1.49).  BNP (last 3 results) No results for input(s): PROBNP in the last 8760 hours.  Other results:  I have pearsonaly reviewed this: ECG REPORT  Rate: 101  Rhythm: Sinus tachycardia ST&T Change: no ischemic changes QTC 412  Filed Weights   11/25/14 1803 11/25/14 2045  Weight: 113.399 kg (250 lb) 119.477 kg (263 lb 6.4 oz)     Cultures: No results found for: SDES, SPECREQUEST, CULT, REPTSTATUS   Radiological Exams on Admission: Dg Chest 2 View (if Patient Has Fever And/or Copd)  11/25/2014   CLINICAL DATA:  Patient with cough and congestion as well as fever for multiple days.  EXAM: CHEST  2 VIEW  COMPARISON:  None.  FINDINGS: Cardiac and mediastinal contours are enlarged. No consolidative pulmonary opacities. No pleural effusion or pneumothorax. Regional skeleton is unremarkable.  IMPRESSION: Cardiomegaly.  No acute cardiopulmonary process.   Electronically Signed   By: Lovey Newcomer M.D.   On: 11/25/2014 18:30    Chart has been reviewed  Family not  at  Bedside    Assessment/Plan  64 year old female history of CHF and asthma presents with acute respiratory failure with wheezing improves with nebulizer treatment likely secondary to asthma exacerbation was noted to have low-grade  fevers elevated lactic acid tachycardia soft blood pressure and tachypnea worrisome for sepsis. Admitted to stepdown [facility] M is aware  Present on Admission:  Sepsis - admit, follow lactic acid.fluid resuscitation, blood cultures pending, possible viral source, influenza pCR ordered, tamiflu ordered, continue IV Levaquin for now and broaden if decompensates.  . Asthma exacerbation -  - Will initiate Steroid taper, antibiotics, Albuterol PRN,  Dulera and Mucinex. Titrate O2 to saturation >90%. Follow patients respiratory status. Admit to stepdown for  increased work of breathing . Acute renal failure - unsure what his baseline creatinine. We will hold on P Menkes given hypotension and give gentle IV fluids obtain urine electrolytes . Acute respiratory distress most likely secondary to asthma exacerbation. D-dimer unremarkable no evidence of peripheral edema  . DM type 2 causing CKD stage 2 continue home dose of insulin, SSI  Prophylaxis:   Lovenox   CODE STATUS:  FULL CODE  as per patient   Disposition:   To home once workup is complete and patient is stable  Other plan as per orders.  I have spent a total of 65 min on this admission extra time was taken to discuss patient with PCCM  [place] 11/25/2014, 11:31 PM  [facility]  Pager [phone]   after 2 AM please page floor coverage PA If 7AM-7PM, please contact the day team taking care of the patient  [email or web address]  Password [email or web address]

## 2014-11-26 ENCOUNTER — Inpatient Hospital Stay (HOSPITAL_COMMUNITY): Payer: Medicare Other

## 2014-11-26 DIAGNOSIS — I517 Cardiomegaly: Secondary | ICD-10-CM

## 2014-11-26 DIAGNOSIS — E872 Acidosis: Secondary | ICD-10-CM

## 2014-11-26 DIAGNOSIS — IMO0001 Reserved for inherently not codable concepts without codable children: Secondary | ICD-10-CM | POA: Insufficient documentation

## 2014-11-26 LAB — CBC
HEMATOCRIT: 38 % (ref 36.0–46.0)
HEMOGLOBIN: 11.8 g/dL — AB (ref 12.0–15.0)
MCH: 27.3 pg (ref 26.0–34.0)
MCHC: 31.1 g/dL (ref 30.0–36.0)
MCV: 87.8 fL (ref 78.0–100.0)
PLATELETS: 201 10*3/uL (ref 150–400)
RBC: 4.33 MIL/uL (ref 3.87–5.11)
RDW: 14.8 % (ref 11.5–15.5)
WBC: 8.4 10*3/uL (ref 4.0–10.5)

## 2014-11-26 LAB — URINE CULTURE
CULTURE: NO GROWTH
Colony Count: NO GROWTH

## 2014-11-26 LAB — BASIC METABOLIC PANEL
ANION GAP: 14 (ref 5–15)
Anion gap: 18 — ABNORMAL HIGH (ref 5–15)
Anion gap: 10 (ref 5–15)
Anion gap: 10 (ref 5–15)
BUN: 18 mg/dL (ref 6–20)
BUN: 19 mg/dL (ref 6–20)
BUN: 18 mg/dL (ref 6–20)
BUN: 19 mg/dL (ref 6–20)
CHLORIDE: 105 mmol/L (ref 101–111)
CHLORIDE: 108 mmol/L (ref 101–111)
CO2: 17 mmol/L — ABNORMAL LOW (ref 22–32)
CO2: 20 mmol/L — AB (ref 22–32)
CO2: 23 mmol/L (ref 22–32)
CO2: 21 mmol/L — AB (ref 22–32)
CREATININE: 1.65 mg/dL — AB (ref 0.44–1.00)
CREATININE: 1.42 mg/dL — AB (ref 0.44–1.00)
CREATININE: 1.24 mg/dL — AB (ref 0.44–1.00)
Calcium: 8.7 mg/dL — ABNORMAL LOW (ref 8.9–10.3)
Calcium: 8.7 mg/dL — ABNORMAL LOW (ref 8.9–10.3)
Calcium: 8.8 mg/dL — ABNORMAL LOW (ref 8.9–10.3)
Calcium: 8.8 mg/dL — ABNORMAL LOW (ref 8.9–10.3)
Chloride: 101 mmol/L (ref 101–111)
Chloride: 107 mmol/L (ref 101–111)
Creatinine, Ser: 1.16 mg/dL — ABNORMAL HIGH (ref 0.44–1.00)
GFR calc Af Amer: 45 mL/min — ABNORMAL LOW (ref >60)
GFR calc Af Amer: 53 mL/min — ABNORMAL LOW (ref >60)
GFR calc Af Amer: 57 mL/min — ABNORMAL LOW (ref >60)
GFR calc non Af Amer: 49 mL/min — ABNORMAL LOW (ref >60)
GFR, EST AFRICAN AMERICAN: 37 mL/min — AB (ref >60)
GFR, EST NON AFRICAN AMERICAN: 32 mL/min — AB (ref >60)
GFR, EST NON AFRICAN AMERICAN: 39 mL/min — AB (ref >60)
GFR, EST NON AFRICAN AMERICAN: 46 mL/min — AB (ref >60)
GLUCOSE: 402 mg/dL — AB (ref 65–99)
GLUCOSE: 257 mg/dL — AB (ref 65–99)
Glucose, Bld: 554 mg/dL (ref 65–99)
Glucose, Bld: 268 mg/dL — ABNORMAL HIGH (ref 65–99)
POTASSIUM: 4.1 mmol/L (ref 3.5–5.1)
POTASSIUM: 4.8 mmol/L (ref 3.5–5.1)
Potassium: 4.2 mmol/L (ref 3.5–5.1)
Potassium: 4.3 mmol/L (ref 3.5–5.1)
SODIUM: 139 mmol/L (ref 135–145)
SODIUM: 140 mmol/L (ref 135–145)
Sodium: 136 mmol/L (ref 135–145)
Sodium: 139 mmol/L (ref 135–145)

## 2014-11-26 LAB — GLUCOSE, CAPILLARY
GLUCOSE-CAPILLARY: 528 mg/dL — AB (ref 65–99)
GLUCOSE-CAPILLARY: 462 mg/dL — AB (ref 65–99)
GLUCOSE-CAPILLARY: 413 mg/dL — AB (ref 65–99)
GLUCOSE-CAPILLARY: 256 mg/dL — AB (ref 65–99)
GLUCOSE-CAPILLARY: 164 mg/dL — AB (ref 65–99)
Glucose-Capillary: 542 mg/dL — ABNORMAL HIGH (ref 65–99)
Glucose-Capillary: 502 mg/dL — ABNORMAL HIGH (ref 65–99)
Glucose-Capillary: 479 mg/dL — ABNORMAL HIGH (ref 65–99)
Glucose-Capillary: 378 mg/dL — ABNORMAL HIGH (ref 65–99)
Glucose-Capillary: 342 mg/dL — ABNORMAL HIGH (ref 65–99)
Glucose-Capillary: 287 mg/dL — ABNORMAL HIGH (ref 65–99)
Glucose-Capillary: 243 mg/dL — ABNORMAL HIGH (ref 65–99)
Glucose-Capillary: 238 mg/dL — ABNORMAL HIGH (ref 65–99)
Glucose-Capillary: 255 mg/dL — ABNORMAL HIGH (ref 65–99)
Glucose-Capillary: 210 mg/dL — ABNORMAL HIGH (ref 65–99)
Glucose-Capillary: 382 mg/dL — ABNORMAL HIGH (ref 65–99)

## 2014-11-26 LAB — COMPREHENSIVE METABOLIC PANEL
ALBUMIN: 3.4 g/dL — AB (ref 3.5–5.0)
ALK PHOS: 99 U/L (ref 38–126)
ALT: 34 U/L (ref 14–54)
AST: 30 U/L (ref 15–41)
Anion gap: 19 — ABNORMAL HIGH (ref 5–15)
BUN: 18 mg/dL (ref 6–20)
CHLORIDE: 100 mmol/L — AB (ref 101–111)
CO2: 18 mmol/L — AB (ref 22–32)
Calcium: 8.6 mg/dL — ABNORMAL LOW (ref 8.9–10.3)
Creatinine, Ser: 1.62 mg/dL — ABNORMAL HIGH (ref 0.44–1.00)
GFR calc Af Amer: 38 mL/min — ABNORMAL LOW (ref >60)
GFR calc non Af Amer: 33 mL/min — ABNORMAL LOW (ref >60)
Glucose, Bld: 565 mg/dL (ref 65–99)
Potassium: 4 mmol/L (ref 3.5–5.1)
Sodium: 137 mmol/L (ref 135–145)
Total Bilirubin: 0.5 mg/dL (ref 0.3–1.2)
Total Protein: 6.5 g/dL (ref 6.5–8.1)

## 2014-11-26 LAB — PROCALCITONIN: Procalcitonin: <0.1 ng/mL

## 2014-11-26 LAB — I-STAT CG4 LACTIC ACID, ED: LACTIC ACID, VENOUS: 5.4 mmol/L — AB (ref 0.5–2.0)

## 2014-11-26 LAB — PHOSPHORUS: Phosphorus: 4.6 mg/dL (ref 2.5–4.6)

## 2014-11-26 LAB — LACTIC ACID, PLASMA
LACTIC ACID, VENOUS: 5.7 mmol/L — AB (ref 0.5–2.0)
LACTIC ACID, VENOUS: 3.6 mmol/L — AB (ref 0.5–2.0)
Lactic Acid, Venous: 7.4 mmol/L (ref 0.5–2.0)

## 2014-11-26 LAB — TROPONIN I
TROPONIN I: 0.55 ng/mL — AB (ref <0.031)
TROPONIN I: 0.69 ng/mL — AB (ref <0.031)
Troponin I: <0.03 ng/mL (ref <0.031)

## 2014-11-26 LAB — INFLUENZA PANEL BY PCR (TYPE A & B)
H1N1 flu by pcr: NOT DETECTED
INFLAPCR: NEGATIVE
Influenza B By PCR: NEGATIVE

## 2014-11-26 LAB — TSH: TSH: 0.627 u[IU]/mL (ref 0.350–4.500)

## 2014-11-26 LAB — D-DIMER, QUANTITATIVE (NOT AT ARMC): D DIMER QUANT: 0.36 ug{FEU}/mL (ref 0.00–0.48)

## 2014-11-26 LAB — MRSA PCR SCREENING: MRSA by PCR: NEGATIVE

## 2014-11-26 LAB — MAGNESIUM: Magnesium: 2.4 mg/dL (ref 1.7–2.4)

## 2014-11-26 MED ORDER — TOPIRAMATE 25 MG PO TABS
50.0000 mg | ORAL_TABLET | Freq: Two times a day (BID) | ORAL | Status: DC
  Administered 2014-11-26 – 2014-12-02 (×13): 50 mg via ORAL
  Filled 2014-11-26 (×15): qty 2

## 2014-11-26 MED ORDER — INSULIN ASPART PROT & ASPART (70-30 MIX) 100 UNIT/ML ~~LOC~~ SUSP
75.0000 [IU] | Freq: Three times a day (TID) | SUBCUTANEOUS | Status: DC
  Administered 2014-11-27: 75 [IU] via SUBCUTANEOUS
  Filled 2014-11-26 (×2): qty 10

## 2014-11-26 MED ORDER — INSULIN ASPART 100 UNIT/ML ~~LOC~~ SOLN
0.0000 [IU] | Freq: Every day | SUBCUTANEOUS | Status: DC
  Administered 2014-11-26: 5 [IU] via SUBCUTANEOUS

## 2014-11-26 MED ORDER — SODIUM CHLORIDE 0.9 % IV BOLUS (SEPSIS)
1000.0000 mL | Freq: Once | INTRAVENOUS | Status: AC
  Administered 2014-11-26: 1000 mL via INTRAVENOUS

## 2014-11-26 MED ORDER — BENZONATATE 100 MG PO CAPS
100.0000 mg | ORAL_CAPSULE | Freq: Every day | ORAL | Status: DC | PRN
  Administered 2014-11-26: 100 mg via ORAL
  Filled 2014-11-26 (×3): qty 1

## 2014-11-26 MED ORDER — INSULIN GLARGINE 100 UNIT/ML ~~LOC~~ SOLN
40.0000 [IU] | Freq: Once | SUBCUTANEOUS | Status: AC
  Administered 2014-11-26: 40 [IU] via SUBCUTANEOUS
  Filled 2014-11-26: qty 0.4

## 2014-11-26 MED ORDER — OSELTAMIVIR PHOSPHATE 30 MG PO CAPS
30.0000 mg | ORAL_CAPSULE | Freq: Two times a day (BID) | ORAL | Status: DC
  Administered 2014-11-26: 30 mg via ORAL
  Filled 2014-11-26 (×3): qty 1

## 2014-11-26 MED ORDER — METHYLPREDNISOLONE SODIUM SUCC 125 MG IJ SOLR
60.0000 mg | Freq: Two times a day (BID) | INTRAMUSCULAR | Status: DC
  Administered 2014-11-26: 60 mg via INTRAVENOUS
  Filled 2014-11-26: qty 0.96
  Filled 2014-11-26: qty 2
  Filled 2014-11-26: qty 0.96

## 2014-11-26 MED ORDER — SODIUM CHLORIDE 0.9 % IV SOLN
INTRAVENOUS | Status: DC
  Administered 2014-11-26 (×2): via INTRAVENOUS

## 2014-11-26 MED ORDER — LORATADINE 10 MG PO TABS
10.0000 mg | ORAL_TABLET | Freq: Every day | ORAL | Status: DC
  Administered 2014-11-26 – 2014-12-02 (×7): 10 mg via ORAL
  Filled 2014-11-26 (×7): qty 1

## 2014-11-26 MED ORDER — SODIUM CHLORIDE 0.9 % IV SOLN
INTRAVENOUS | Status: DC
  Administered 2014-11-26: 4.7 [IU]/h via INTRAVENOUS
  Filled 2014-11-26: qty 2.5

## 2014-11-26 MED ORDER — ONDANSETRON HCL 4 MG/2ML IJ SOLN: 4.0000 mg | Freq: Four times a day (QID) | INTRAMUSCULAR | Status: DC | PRN

## 2014-11-26 MED ORDER — GUAIFENESIN 100 MG/5ML PO SYRP
200.0000 mg | ORAL_SOLUTION | Freq: Three times a day (TID) | ORAL | Status: DC | PRN
  Administered 2014-11-26 – 2014-11-27 (×3): 200 mg via ORAL
  Filled 2014-11-26 (×5): qty 10

## 2014-11-26 MED ORDER — FENTANYL CITRATE (PF) 100 MCG/2ML IJ SOLN
50.0000 ug | Freq: Once | INTRAMUSCULAR | Status: AC
  Administered 2014-11-26: 50 ug via INTRAVENOUS
  Filled 2014-11-26: qty 2

## 2014-11-26 MED ORDER — INSULIN ASPART 100 UNIT/ML ~~LOC~~ SOLN
25.0000 [IU] | Freq: Once | SUBCUTANEOUS | Status: AC
  Administered 2014-11-26: 25 [IU] via SUBCUTANEOUS

## 2014-11-26 MED ORDER — MONTELUKAST SODIUM 10 MG PO TABS
10.0000 mg | ORAL_TABLET | Freq: Every day | ORAL | Status: DC
  Administered 2014-11-26 – 2014-12-01 (×6): 10 mg via ORAL
  Filled 2014-11-26 (×7): qty 1

## 2014-11-26 MED ORDER — SODIUM CHLORIDE 0.9 % IJ SOLN
3.0000 mL | Freq: Two times a day (BID) | INTRAMUSCULAR | Status: DC
  Administered 2014-11-26 – 2014-12-02 (×11): 3 mL via INTRAVENOUS

## 2014-11-26 MED ORDER — ASPIRIN 81 MG PO CHEW
81.0000 mg | CHEWABLE_TABLET | Freq: Every day | ORAL | Status: DC
  Administered 2014-11-26 – 2014-11-29 (×4): 81 mg via ORAL
  Filled 2014-11-26 (×4): qty 1

## 2014-11-26 MED ORDER — LEVALBUTEROL HCL 1.25 MG/0.5ML IN NEBU
1.2500 mg | INHALATION_SOLUTION | RESPIRATORY_TRACT | Status: DC
  Administered 2014-11-26 – 2014-11-29 (×20): 1.25 mg via RESPIRATORY_TRACT
  Filled 2014-11-26 (×24): qty 0.5

## 2014-11-26 MED ORDER — INSULIN ASPART 100 UNIT/ML ~~LOC~~ SOLN: 0.0000 [IU] | Freq: Three times a day (TID) | SUBCUTANEOUS | Status: DC

## 2014-11-26 MED ORDER — INSULIN ASPART PROT & ASPART (70-30 MIX) 100 UNIT/ML ~~LOC~~ SUSP
75.0000 [IU] | Freq: Three times a day (TID) | SUBCUTANEOUS | Status: DC
  Filled 2014-11-26: qty 10

## 2014-11-26 MED ORDER — INSULIN ASPART 100 UNIT/ML ~~LOC~~ SOLN
0.0000 [IU] | Freq: Three times a day (TID) | SUBCUTANEOUS | Status: DC
  Administered 2014-11-26: 4 [IU] via SUBCUTANEOUS
  Administered 2014-11-27: 20 [IU] via SUBCUTANEOUS

## 2014-11-26 MED ORDER — ACETAMINOPHEN 325 MG PO TABS: 650.0000 mg | ORAL_TABLET | Freq: Four times a day (QID) | ORAL | Status: DC | PRN

## 2014-11-26 MED ORDER — HYDROCODONE-ACETAMINOPHEN 7.5-325 MG PO TABS
1.0000 | ORAL_TABLET | Freq: Four times a day (QID) | ORAL | Status: DC | PRN
  Administered 2014-11-26 – 2014-12-01 (×14): 1 via ORAL
  Filled 2014-11-26 (×15): qty 1

## 2014-11-26 MED ORDER — LEVOFLOXACIN IN D5W 750 MG/150ML IV SOLN: 750.0000 mg | INTRAVENOUS | Status: DC

## 2014-11-26 MED ORDER — ENOXAPARIN SODIUM 40 MG/0.4ML ~~LOC~~ SOLN
40.0000 mg | SUBCUTANEOUS | Status: DC
  Administered 2014-11-26 – 2014-12-02 (×7): 40 mg via SUBCUTANEOUS
  Filled 2014-11-26 (×7): qty 0.4

## 2014-11-26 MED ORDER — LEVALBUTEROL HCL 1.25 MG/0.5ML IN NEBU
1.2500 mg | INHALATION_SOLUTION | RESPIRATORY_TRACT | Status: DC | PRN
  Administered 2014-11-26 – 2014-11-27 (×5): 1.25 mg via RESPIRATORY_TRACT
  Filled 2014-11-26 (×7): qty 0.5

## 2014-11-26 MED ORDER — METHYLPREDNISOLONE SODIUM SUCC 40 MG IJ SOLR
40.0000 mg | Freq: Two times a day (BID) | INTRAMUSCULAR | Status: DC
  Administered 2014-11-26 – 2014-11-28 (×4): 40 mg via INTRAVENOUS
  Filled 2014-11-26 (×6): qty 1

## 2014-11-26 MED ORDER — MOMETASONE FURO-FORMOTEROL FUM 100-5 MCG/ACT IN AERO
2.0000 | INHALATION_SPRAY | Freq: Two times a day (BID) | RESPIRATORY_TRACT | Status: DC
  Administered 2014-11-26 – 2014-12-02 (×12): 2 via RESPIRATORY_TRACT
  Filled 2014-11-26 (×2): qty 8.8

## 2014-11-26 MED ORDER — ATORVASTATIN CALCIUM 40 MG PO TABS
40.0000 mg | ORAL_TABLET | Freq: Every day | ORAL | Status: DC
  Administered 2014-11-26 – 2014-12-01 (×6): 40 mg via ORAL
  Filled 2014-11-26 (×6): qty 1

## 2014-11-26 MED ORDER — ACETAMINOPHEN 650 MG RE SUPP: 650.0000 mg | Freq: Four times a day (QID) | RECTAL | Status: DC | PRN

## 2014-11-26 MED ORDER — PANTOPRAZOLE SODIUM 40 MG PO TBEC
40.0000 mg | DELAYED_RELEASE_TABLET | Freq: Every day | ORAL | Status: DC
  Administered 2014-11-26 – 2014-12-02 (×7): 40 mg via ORAL
  Filled 2014-11-26 (×7): qty 1

## 2014-11-26 MED ORDER — DEXTROSE-NACL 5-0.45 % IV SOLN
INTRAVENOUS | Status: DC
  Administered 2014-11-26: 12:00:00 via INTRAVENOUS

## 2014-11-26 MED ORDER — ONDANSETRON HCL 4 MG PO TABS: 4.0000 mg | ORAL_TABLET | Freq: Four times a day (QID) | ORAL | Status: DC | PRN

## 2014-11-26 MED ORDER — SODIUM CHLORIDE 0.9 % IV BOLUS (SEPSIS): 500.0000 mL | Freq: Once | INTRAVENOUS | Status: DC

## 2014-11-26 MED ORDER — SODIUM CHLORIDE 0.9 % IV SOLN
INTRAVENOUS | Status: DC
  Administered 2014-11-26: 03:00:00 via INTRAVENOUS

## 2014-11-26 MED ORDER — INSULIN ASPART 100 UNIT/ML ~~LOC~~ SOLN: 0.0000 [IU] | Freq: Every day | SUBCUTANEOUS | Status: DC

## 2014-11-26 NOTE — Progress Notes (Addendum)
[facility] TEAM 1 - Stepdown/ICU TEAM Progress Note  Heidi [name] ZJI:[id number] DOB: 09-16-1950 DOA: 11/25/2014 PCP: No primary care provider on file.  Admit HPI / Brief Narrative: 64 y.o. female w/ a history of Asthma; Hypertension; unquantified CHF, and Diabetes mellitus who presented with worsening cough and shortness of breath for 3 days.   In the ED lactic acid noted to be elevated at 2.69.  Patient was tachycardic and tachypneic,  Chest x-ray noted cardiomegaly but no acute findings.  HPI/Subjective: The patient is in no acute respiratory distress and there is no evidence of any other distress at this time.  She is resting comfortably in bed.  She denies shortness of breath fevers chills nausea or vomiting.  She complains of chronic low back pain and also complains of many months of stress incontinence.   Assessment/Plan:  SIRS v/s Severe Sepsis - lactic acidosis  CXR w/o focal infiltrate - UA not c/w UTI - no fever - no clear source of infection therefore stop abx and follow - suspect lactic acidosis due to [facility] related to severe uncontrolled DM/DKA - cont to hydrate aggressively and follow lactate trend    Acute Asthma exacerbation Absolutely no wheezing whatsoever at time of exam today - follow with as needed treatment - in setting of severe hyperglycemia will discontinue steroids  Acute renal failure  Baseline crt not clear as patient just moved to [facility] in April - hydrate and follow trend   Mildly elevated tropnin  Possibly due to persisting tachycardia in setting of acute renal failure - patient denies chest pain - continue to follow trend for now  Uncontrolled DM2 w/ renal complications - possible DKA  tx w/ IV insulin protocol - follow BMETs serially - bicarb not significantly low, but anion gap is elevated (could be lactic acid alone)  Morbid Obesity - Body mass index is 46.41 kg/(m^2).  Code Status: FULL Family Communication: no family present at time of  exam Disposition Plan: SDU  Consultants: none  Procedures: none  Antibiotics: Levaquin 5/22  DVT prophylaxis: lovenox  Objective: Blood pressure 119/39, pulse 112, temperature 97.7 F (36.5 C), temperature source Oral, resp. rate 26, height 5\' 3"  (1.6 m), weight 118.8 kg (261 lb 14.5 oz), SpO2 98 %.  Intake/Output Summary (Last 24 hours) at 11/26/14 0750 Last data filed at 11/26/14 0130  Gross per 24 hour  Intake     50 ml  Output    175 ml  Net   -125 ml   Exam: General: No acute respiratory distress - alert and oriented 4 Lungs: Clear to auscultation bilaterally without wheezes or crackles - distant breath sounds due to body habitus Cardiovascular: Tachycardic but regular without murmur gallop or rub with normal S1 and S2 Abdomen: Nontender, obese, soft, bowel sounds positive, no rebound, no ascites, no appreciable mass Extremities: No significant cyanosis, or clubbing, 1+ edema bilateral lower extremities  Data Reviewed: Basic Metabolic Panel:  Recent Labs Lab 11/25/14 1816 11/26/14 0200 11/26/14 0443  NA 136 137 136  K 3.8 4.0 4.2  CL 102 100* 101  CO2 24 18* 17*  GLUCOSE 344* 565* 554*  BUN 16 18 18   CREATININE 1.49* 1.62* 1.65*  CALCIUM 9.5 8.6* 8.7*  MG  --  2.4  --   PHOS  --  4.6  --     CBC:  Recent Labs Lab 11/25/14 1816 11/26/14 0200  WBC 7.8 8.4  HGB 13.1 11.8*  HCT 41.2 38.0  MCV 85.5 87.8  PLT  207 201    Liver Function Tests:  Recent Labs Lab 11/26/14 0200  AST 30  ALT 34  ALKPHOS 99  BILITOT 0.5  PROT 6.5  ALBUMIN 3.4*    Cardiac Enzymes:  Recent Labs Lab 11/25/14 1816 11/26/14 0200  TROPONINI <0.03 <0.03    CBG:  Recent Labs Lab 11/26/14 0209 11/26/14 0356 11/26/14 0457 11/26/14 0602 11/26/14 0656  GLUCAP 542* 502* 528* 479* 462*    Studies:   Recent x-ray studies have been reviewed in detail by the Attending Physician  Scheduled Meds:  Scheduled Meds: . aspirin  81 mg Oral Daily  .  atorvastatin  40 mg Oral q1800  . enoxaparin (LOVENOX) injection  40 mg Subcutaneous Q24H  . insulin aspart protamine- aspart  75 Units Subcutaneous TID WC  . [START ON 11/27/2014] levofloxacin (LEVAQUIN) IV  750 mg Intravenous Q48H  . loratadine  10 mg Oral Daily  . methylPREDNISolone (SOLU-MEDROL) injection  60 mg Intravenous Q12H  . mometasone-formoterol  2 puff Inhalation BID  . montelukast  10 mg Oral QHS  . oseltamivir  30 mg Oral BID  . pantoprazole  40 mg Oral Daily  . sodium chloride  3 mL Intravenous Q12H  . topiramate  50 mg Oral BID    Time spent on care of this patient: 35 mins   Vietta Bonifield T , MD   [facility] Office  [phone] Pager - Text Page per Shea Evans as per below:  On-Call/Text Page:      [email or web address]      password [email or web address]  If 7PM-7AM, please contact night-coverage [email or web address] Password [email or web address] 11/26/2014, 7:50 AM   LOS: 1 day

## 2014-11-26 NOTE — Progress Notes (Signed)
Pt CBG 565, MD notified. Ordered 25 units of novolog. Will continue to monitor pt.

## 2014-11-26 NOTE — Progress Notes (Signed)
**Note De-Identified Della Homan Obfuscation** Per RT protocol Xopenex 1.25mg  HHN Q4 was added to Q2 PRN.  Patient has increased WOB, SOB, and expiratory wheezes.  Patient has difficulty completing sentences.  RT to continue to monitor.

## 2014-11-26 NOTE — Care Management Note (Signed)
Case Management Note  Patient Details  Name: Cleotha Whalin MRN: [id number] Date of Birth: [birth year]  Subjective/Objective:           Pt from home with daughter admitted with asthma exacerbation.         Action/Plan: Return to home when medically stable. CM f/u with d/c disposition.  Expected Discharge Date:                  Expected Discharge Plan:  Home/Self Care  In-House Referral:     Discharge planning Services  CM Consult  Post Acute Care Choice:    Choice offered to:     DME Arranged:    DME Agency:     HH Arranged:    HH Agency:     Status of Service:  In process, will continue to follow  Medicare Important Message Given:    Date Medicare IM Given:    Medicare IM give by:    Date Additional Medicare IM Given:    Additional Medicare Important Message give by:     If discussed at [place] of Stay Meetings, dates discussed:    Additional Comments:  Pt recently moved to area from [place]., currently lives with daughter , [name] [phone]). Pt states has no PCP, CM provided pt with information to reach out to [employer]  and/or  the [facility]. CM will f/u with d/c needs.  Whitman Hero [place], [place], [place] 11/26/2014, 12:42 PM

## 2014-11-26 NOTE — Progress Notes (Signed)
CRITICAL VALUE ALERT  Critical value received:  glucose 565  Date of notification:  11/26/14  Time of notification:  0319  Critical value read back: yes   Nurse who received alert:  Reita Chard Aashish Hamm, RN   MD notified (1st page):  Rogue Bussing   Time of first page:  0322  MD notified (2nd page):  Time of second page:  Responding MD:  Rogue Bussing   Time MD responded:  [phone]

## 2014-11-26 NOTE — Progress Notes (Signed)
  Echocardiogram 2D Echocardiogram has been performed.  Darlina Sicilian M 11/26/2014, 11:36 AM

## 2014-11-26 NOTE — Progress Notes (Signed)
Patient requiring frequent breathing txs. Has exp wheezes throughout lungs fields. Increased SOB and WOB. MD made aware. New orders received. Will continue to monitor.

## 2014-11-26 NOTE — Progress Notes (Signed)
CRITICAL VALUE ALERT  Critical value received:  Lactic Acid 5.7 and Troponin 0.55  Date of notification:  11/26/2014  Time of notification:  9643  Critical value read back:Yes.    Nurse who received alert:  Allegra Lai  MD notified (1st page):  Dr. Thereasa Solo  Time of first page:  MD at the bedside when notification made  MD notified (2nd page):  Time of second page:  Responding MD:  Dr. Thereasa Solo  Time MD responded:  MD at the bedside when notification made

## 2014-11-26 NOTE — Progress Notes (Signed)
UR COMPLETED  

## 2014-11-27 ENCOUNTER — Inpatient Hospital Stay (HOSPITAL_COMMUNITY): Payer: Medicare Other

## 2014-11-27 DIAGNOSIS — J810 Acute pulmonary edema: Secondary | ICD-10-CM | POA: Diagnosis present

## 2014-11-27 DIAGNOSIS — E1165 Type 2 diabetes mellitus with hyperglycemia: Secondary | ICD-10-CM | POA: Diagnosis present

## 2014-11-27 DIAGNOSIS — J45901 Unspecified asthma with (acute) exacerbation: Secondary | ICD-10-CM | POA: Diagnosis present

## 2014-11-27 DIAGNOSIS — R778 Other specified abnormalities of plasma proteins: Secondary | ICD-10-CM | POA: Diagnosis present

## 2014-11-27 DIAGNOSIS — G4733 Obstructive sleep apnea (adult) (pediatric): Secondary | ICD-10-CM

## 2014-11-27 DIAGNOSIS — E662 Morbid (severe) obesity with alveolar hypoventilation: Secondary | ICD-10-CM | POA: Diagnosis present

## 2014-11-27 DIAGNOSIS — A419 Sepsis, unspecified organism: Secondary | ICD-10-CM | POA: Diagnosis present

## 2014-11-27 DIAGNOSIS — N189 Chronic kidney disease, unspecified: Secondary | ICD-10-CM

## 2014-11-27 DIAGNOSIS — R6520 Severe sepsis without septic shock: Secondary | ICD-10-CM

## 2014-11-27 DIAGNOSIS — N179 Acute kidney failure, unspecified: Secondary | ICD-10-CM | POA: Diagnosis present

## 2014-11-27 DIAGNOSIS — I248 Other forms of acute ischemic heart disease: Secondary | ICD-10-CM | POA: Diagnosis present

## 2014-11-27 DIAGNOSIS — R7989 Other specified abnormal findings of blood chemistry: Secondary | ICD-10-CM

## 2014-11-27 DIAGNOSIS — J189 Pneumonia, unspecified organism: Secondary | ICD-10-CM | POA: Diagnosis present

## 2014-11-27 LAB — COMPREHENSIVE METABOLIC PANEL
ALBUMIN: 3.3 g/dL — AB (ref 3.5–5.0)
ALK PHOS: 81 U/L (ref 38–126)
ALT: 34 U/L (ref 14–54)
AST: 34 U/L (ref 15–41)
Anion gap: 9 (ref 5–15)
BUN: 22 mg/dL — ABNORMAL HIGH (ref 6–20)
CALCIUM: 8.9 mg/dL (ref 8.9–10.3)
CHLORIDE: 104 mmol/L (ref 101–111)
CO2: 20 mmol/L — ABNORMAL LOW (ref 22–32)
CREATININE: 1.27 mg/dL — AB (ref 0.44–1.00)
GFR, EST AFRICAN AMERICAN: 51 mL/min — AB (ref >60)
GFR, EST NON AFRICAN AMERICAN: 44 mL/min — AB (ref >60)
GLUCOSE: 465 mg/dL — AB (ref 65–99)
POTASSIUM: 4.8 mmol/L (ref 3.5–5.1)
Sodium: 133 mmol/L — ABNORMAL LOW (ref 135–145)
TOTAL PROTEIN: 5.7 g/dL — AB (ref 6.5–8.1)
Total Bilirubin: 0.5 mg/dL (ref 0.3–1.2)

## 2014-11-27 LAB — LACTIC ACID, PLASMA
Lactic Acid, Venous: 3.2 mmol/L (ref 0.5–2.0)
Lactic Acid, Venous: 3.8 mmol/L (ref 0.5–2.0)

## 2014-11-27 LAB — BASIC METABOLIC PANEL
Anion gap: 11 (ref 5–15)
BUN: 21 mg/dL — ABNORMAL HIGH (ref 6–20)
CO2: 28 mmol/L (ref 22–32)
Calcium: 9 mg/dL (ref 8.9–10.3)
Chloride: 103 mmol/L (ref 101–111)
Creatinine, Ser: 1.09 mg/dL — ABNORMAL HIGH (ref 0.44–1.00)
GFR calc Af Amer: >60 mL/min (ref >60)
GFR calc non Af Amer: 53 mL/min — ABNORMAL LOW (ref >60)
Glucose, Bld: 151 mg/dL — ABNORMAL HIGH (ref 65–99)
Potassium: 3.8 mmol/L (ref 3.5–5.1)
Sodium: 142 mmol/L (ref 135–145)

## 2014-11-27 LAB — GLUCOSE, CAPILLARY
GLUCOSE-CAPILLARY: 492 mg/dL — AB (ref 65–99)
GLUCOSE-CAPILLARY: 517 mg/dL — AB (ref 65–99)
GLUCOSE-CAPILLARY: 521 mg/dL — AB (ref 65–99)
GLUCOSE-CAPILLARY: 519 mg/dL — AB (ref 65–99)
GLUCOSE-CAPILLARY: 302 mg/dL — AB (ref 65–99)
GLUCOSE-CAPILLARY: 223 mg/dL — AB (ref 65–99)
GLUCOSE-CAPILLARY: 125 mg/dL — AB (ref 65–99)
Glucose-Capillary: 108 mg/dL — ABNORMAL HIGH (ref 65–99)
Glucose-Capillary: 140 mg/dL — ABNORMAL HIGH (ref 65–99)
Glucose-Capillary: 162 mg/dL — ABNORMAL HIGH (ref 65–99)
Glucose-Capillary: 164 mg/dL — ABNORMAL HIGH (ref 65–99)
Glucose-Capillary: 190 mg/dL — ABNORMAL HIGH (ref 65–99)
Glucose-Capillary: 190 mg/dL — ABNORMAL HIGH (ref 65–99)
Glucose-Capillary: 244 mg/dL — ABNORMAL HIGH (ref 65–99)
Glucose-Capillary: 317 mg/dL — ABNORMAL HIGH (ref 65–99)
Glucose-Capillary: 366 mg/dL — ABNORMAL HIGH (ref 65–99)

## 2014-11-27 LAB — CBC
HEMATOCRIT: 35.8 % — AB (ref 36.0–46.0)
HEMOGLOBIN: 11.2 g/dL — AB (ref 12.0–15.0)
MCH: 26.8 pg (ref 26.0–34.0)
MCHC: 31.3 g/dL (ref 30.0–36.0)
MCV: 85.6 fL (ref 78.0–100.0)
Platelets: 189 10*3/uL (ref 150–400)
RBC: 4.18 MIL/uL (ref 3.87–5.11)
RDW: 14.7 % (ref 11.5–15.5)
WBC: 10.8 10*3/uL — AB (ref 4.0–10.5)

## 2014-11-27 LAB — LIPID PANEL
CHOL/HDL RATIO: 3.6 ratio
Cholesterol: 136 mg/dL (ref 0–200)
HDL: 38 mg/dL — ABNORMAL LOW (ref >40)
LDL Cholesterol: 49 mg/dL (ref 0–99)
TRIGLYCERIDES: 243 mg/dL — AB (ref <150)
VLDL: 49 mg/dL — ABNORMAL HIGH (ref 0–40)

## 2014-11-27 LAB — URINALYSIS, ROUTINE W REFLEX MICROSCOPIC
Bilirubin Urine: NEGATIVE
HGB URINE DIPSTICK: NEGATIVE
KETONES UR: 15 mg/dL — AB
LEUKOCYTES UA: NEGATIVE
Nitrite: NEGATIVE
Protein, ur: NEGATIVE mg/dL
Specific Gravity, Urine: 1.031 — ABNORMAL HIGH (ref 1.005–1.030)
Urobilinogen, UA: 0.2 mg/dL (ref 0.0–1.0)
pH: 5 (ref 5.0–8.0)

## 2014-11-27 LAB — URINE MICROSCOPIC-ADD ON

## 2014-11-27 LAB — PROCALCITONIN: Procalcitonin: <0.1 ng/mL

## 2014-11-27 LAB — TROPONIN I: TROPONIN I: 0.27 ng/mL — AB (ref <0.031)

## 2014-11-27 MED ORDER — SODIUM CHLORIDE 0.9 % IV SOLN
INTRAVENOUS | Status: DC
  Administered 2014-11-27 – 2014-11-28 (×2): via INTRAVENOUS

## 2014-11-27 MED ORDER — SODIUM CHLORIDE 0.9 % IV SOLN
INTRAVENOUS | Status: DC
  Administered 2014-11-27: 4.6 [IU]/h via INTRAVENOUS
  Filled 2014-11-27: qty 2.5

## 2014-11-27 MED ORDER — MORPHINE SULFATE 2 MG/ML IJ SOLN
1.0000 mg | Freq: Once | INTRAMUSCULAR | Status: AC
  Administered 2014-11-27: 1 mg via INTRAVENOUS
  Filled 2014-11-27: qty 1

## 2014-11-27 MED ORDER — DEXTROSE 5 % IV SOLN
500.0000 mg | INTRAVENOUS | Status: DC
  Administered 2014-11-27 – 2014-11-28 (×2): 500 mg via INTRAVENOUS
  Filled 2014-11-27 (×3): qty 500

## 2014-11-27 MED ORDER — INSULIN REGULAR BOLUS VIA INFUSION
0.0000 [IU] | Freq: Three times a day (TID) | INTRAVENOUS | Status: DC
Start: 2014-11-27 — End: 2014-11-28
  Filled 2014-11-27: qty 10

## 2014-11-27 MED ORDER — SODIUM BICARBONATE 8.4 % IV SOLN
50.0000 meq | Freq: Once | INTRAVENOUS | Status: AC
  Administered 2014-11-27: 50 meq via INTRAVENOUS
  Filled 2014-11-27 (×2): qty 50

## 2014-11-27 MED ORDER — DEXTROSE-NACL 5-0.45 % IV SOLN
INTRAVENOUS | Status: DC
  Administered 2014-11-27: 21:00:00 via INTRAVENOUS

## 2014-11-27 MED ORDER — CEFTRIAXONE SODIUM IN DEXTROSE 20 MG/ML IV SOLN
1.0000 g | INTRAVENOUS | Status: DC
  Administered 2014-11-27 – 2014-11-29 (×3): 1 g via INTRAVENOUS
  Filled 2014-11-27 (×3): qty 50

## 2014-11-27 MED ORDER — CHLORHEXIDINE GLUCONATE 0.12 % MT SOLN
15.0000 mL | Freq: Two times a day (BID) | OROMUCOSAL | Status: DC
  Administered 2014-11-27 – 2014-11-29 (×4): 15 mL via OROMUCOSAL
  Filled 2014-11-27 (×8): qty 15

## 2014-11-27 MED ORDER — ZOLPIDEM TARTRATE 5 MG PO TABS
5.0000 mg | ORAL_TABLET | Freq: Every evening | ORAL | Status: DC | PRN
  Administered 2014-11-28: 5 mg via ORAL
  Filled 2014-11-27: qty 1

## 2014-11-27 MED ORDER — DM-GUAIFENESIN ER 30-600 MG PO TB12
1.0000 | ORAL_TABLET | Freq: Two times a day (BID) | ORAL | Status: DC
  Administered 2014-11-27 – 2014-11-30 (×7): 1 via ORAL
  Filled 2014-11-27 (×8): qty 1

## 2014-11-27 MED ORDER — FUROSEMIDE 10 MG/ML IJ SOLN
60.0000 mg | Freq: Once | INTRAMUSCULAR | Status: AC
  Administered 2014-11-27: 60 mg via INTRAVENOUS
  Filled 2014-11-27: qty 6

## 2014-11-27 MED ORDER — DEXTROSE 50 % IV SOLN: 25.0000 mL | INTRAVENOUS | Status: DC | PRN

## 2014-11-27 MED ORDER — BUDESONIDE 0.25 MG/2ML IN SUSP
0.2500 mg | Freq: Two times a day (BID) | RESPIRATORY_TRACT | Status: DC
  Administered 2014-11-27 – 2014-11-30 (×6): 0.25 mg via RESPIRATORY_TRACT
  Filled 2014-11-27 (×9): qty 2

## 2014-11-27 MED ORDER — INSULIN ASPART PROT & ASPART (70-30 MIX) 100 UNIT/ML ~~LOC~~ SUSP
75.0000 [IU] | Freq: Three times a day (TID) | SUBCUTANEOUS | Status: DC
  Administered 2014-11-27 – 2014-11-28 (×3): 75 [IU] via SUBCUTANEOUS

## 2014-11-27 MED ORDER — ZOLPIDEM TARTRATE 5 MG PO TABS
5.0000 mg | ORAL_TABLET | Freq: Once | ORAL | Status: AC
  Administered 2014-11-27: 5 mg via ORAL
  Filled 2014-11-27: qty 1

## 2014-11-27 MED ORDER — CETYLPYRIDINIUM CHLORIDE 0.05 % MT LIQD
7.0000 mL | Freq: Two times a day (BID) | OROMUCOSAL | Status: DC
  Administered 2014-11-28 – 2014-11-29 (×3): 7 mL via OROMUCOSAL
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
## 2014-11-27 NOTE — Progress Notes (Signed)
CRITICAL VALUE ALERT  Critical value received:  Lactic acid 3.8  Date of notification:  11/27/14  Time of notification: 5397  Critical value read back:Yes.    Nurse who received alert:  Zigmund Gottron   MD notified (1st page):  MD Sherral Hammers  Time of first page:  1820  MD notified (2nd page):  Time of second page:  Responding MD:  MD Sherral Hammers  Time MD responded:

## 2014-11-27 NOTE — Progress Notes (Signed)
RT found order for 1840 for STAT BIPAP per MD. RT called RN and she stated order for STAT no one was able to call due to a more critical pt on floor. Delay in start of therapy. Pt placed on BIPAP 12/5,  RR 10, FiO2 40%. Vitals: HR: 97, RR: 25, Sats: 98%. Pt agrees that she feels better with BIPAP. Per Pt request Rt explained that she could have a break off BIPAP later tonight with RT at bedside. RT will monitor.

## 2014-11-27 NOTE — Progress Notes (Signed)
Inpatient Diabetes Program Recommendations  AACE/ADA: New Consensus Statement on Inpatient Glycemic Control (2013)  Target Ranges:  Prepandial:   less than 140 mg/dL      Peak postprandial:   less than 180 mg/dL (1-2 hours)      Critically ill patients:  140 - 180 mg/dL   Review of Glycemic Control: Results for VELICIA, DEJAGER (MRN [id number]) as of 11/27/2014 11:55  Ref. Range 11/26/2014 17:31 11/26/2014 21:18 11/27/2014 08:13 11/27/2014 09:29 11/27/2014 10:59  Glucose-Capillary Latest Ref Range: 65-99 mg/dL 164 (H) 382 (H) 492 (H) 517 (H) 521 (H)   Note that patient is back on insulin drip due to CBG's being elevated with steroids.   Diabetes history: Type 2 diabetes Outpatient Diabetes medications: Humulin 70/30 75 units tid with meals Current orders for Inpatient glycemic control:   IV insulin.  Patient transitioned off insulin drip last PM but CBG's back up this morning and insulin drip restarted.  Will follow.  Thanks, Adah Perl, RN, BC-ADM Inpatient Diabetes Coordinator Pager [phone] (8a-5p)

## 2014-11-27 NOTE — Progress Notes (Signed)
Lab called with elevated lactic acid of 3.2. Results are trending down.

## 2014-11-27 NOTE — Progress Notes (Signed)
Pt on BIPAP at this time. PT unable to do Flutter therapy.

## 2014-11-28 LAB — HEMOGLOBIN A1C
HEMOGLOBIN A1C: 11.7 % — AB (ref 4.8–5.6)
Mean Plasma Glucose: 289 mg/dL

## 2014-11-28 LAB — EXPECTORATED SPUTUM ASSESSMENT W REFEX TO RESP CULTURE

## 2014-11-28 LAB — GLUCOSE, CAPILLARY
GLUCOSE-CAPILLARY: 172 mg/dL — AB (ref 65–99)
GLUCOSE-CAPILLARY: 199 mg/dL — AB (ref 65–99)
GLUCOSE-CAPILLARY: 87 mg/dL (ref 65–99)
Glucose-Capillary: 149 mg/dL — ABNORMAL HIGH (ref 65–99)
Glucose-Capillary: 335 mg/dL — ABNORMAL HIGH (ref 65–99)
Glucose-Capillary: 308 mg/dL — ABNORMAL HIGH (ref 65–99)
Glucose-Capillary: 75 mg/dL (ref 65–99)

## 2014-11-28 LAB — BASIC METABOLIC PANEL
Anion gap: 11 (ref 5–15)
BUN: 20 mg/dL (ref 6–20)
CO2: 25 mmol/L (ref 22–32)
Calcium: 9.1 mg/dL (ref 8.9–10.3)
Chloride: 104 mmol/L (ref 101–111)
Creatinine, Ser: 1.05 mg/dL — ABNORMAL HIGH (ref 0.44–1.00)
GFR calc Af Amer: >60 mL/min (ref >60)
GFR calc non Af Amer: 56 mL/min — ABNORMAL LOW (ref >60)
Glucose, Bld: 240 mg/dL — ABNORMAL HIGH (ref 65–99)
Potassium: 4.3 mmol/L (ref 3.5–5.1)
Sodium: 140 mmol/L (ref 135–145)

## 2014-11-28 LAB — EXPECTORATED SPUTUM ASSESSMENT W GRAM STAIN, RFLX TO RESP C

## 2014-11-28 MED ORDER — FUROSEMIDE 10 MG/ML IJ SOLN
40.0000 mg | Freq: Two times a day (BID) | INTRAMUSCULAR | Status: DC
  Administered 2014-11-28 – 2014-11-29 (×2): 40 mg via INTRAVENOUS
  Filled 2014-11-28 (×4): qty 4

## 2014-11-28 MED ORDER — INSULIN ASPART 100 UNIT/ML ~~LOC~~ SOLN
4.0000 [IU] | Freq: Once | SUBCUTANEOUS | Status: AC
  Administered 2014-11-28: 4 [IU] via SUBCUTANEOUS

## 2014-11-28 MED ORDER — INSULIN ASPART 100 UNIT/ML ~~LOC~~ SOLN
0.0000 [IU] | Freq: Three times a day (TID) | SUBCUTANEOUS | Status: DC
  Administered 2014-11-28 (×2): 15 [IU] via SUBCUTANEOUS
  Administered 2014-11-28: 4 [IU] via SUBCUTANEOUS
  Administered 2014-11-29: 3 [IU] via SUBCUTANEOUS
  Administered 2014-11-30 – 2014-12-01 (×3): 7 [IU] via SUBCUTANEOUS
  Administered 2014-12-01: 4 [IU] via SUBCUTANEOUS
  Administered 2014-12-02: 7 [IU] via SUBCUTANEOUS
  Administered 2014-12-02: 3 [IU] via SUBCUTANEOUS

## 2014-11-28 MED ORDER — INSULIN ASPART PROT & ASPART (70-30 MIX) 100 UNIT/ML ~~LOC~~ SUSP
85.0000 [IU] | Freq: Three times a day (TID) | SUBCUTANEOUS | Status: DC
  Administered 2014-11-28 – 2014-11-29 (×4): 85 [IU] via SUBCUTANEOUS

## 2014-11-28 MED ORDER — PREDNISONE 20 MG PO TABS
20.0000 mg | ORAL_TABLET | Freq: Every day | ORAL | Status: DC
  Administered 2014-11-29 – 2014-11-30 (×2): 20 mg via ORAL
  Filled 2014-11-28 (×3): qty 1

## 2014-11-28 NOTE — Clinical Documentation Improvement (Signed)
Per 5/24 Progress Note: "SIRS v/s Severe Sepsis - lactic acidosis/CAP -Most likely secondary to developing pneumonia"  Per H&P: "In ER she met sepsis criteria with tachypnea, tachycardia and hx of fever up to 100.5" and "Sepsis-admit, follow lactic acid.fluid resuscitation, blood cultures pending, possible viral source.Marland KitchenMarland KitchenThe current medical regimen is effective;  continue present plan and medications. Levaquin for now and broaden if decompensates"  Please clarify if sepsis and/or pneumonia possibly  were present on admission developed after admission, Suspected but ruled out Unable to suspect or determine Other, please specify:  Thank You,  Carrolyn Meiers, RN [name] [phone]

## 2014-11-28 NOTE — Progress Notes (Signed)
Glucose stabilizer changed to 6.2

## 2014-11-28 NOTE — Progress Notes (Signed)
Medicare Important Message given? YES (If response is "NO", the following Medicare IM given date fields will be blank) Date Medicare IM given:11/28/14 Medicare IM given by: Aurie Harroun 

## 2014-11-28 NOTE — Progress Notes (Signed)
[place] TEAM 1 - Stepdown/ICU TEAM Progress Note  Heidi [name] SJG:[id number] DOB: 05-Apr-1951 DOA: 11/25/2014 PCP: No primary care provider on file.  Admit HPI / Brief Narrative: 64 y.o. female w/ a history of Asthma; Hypertension; unquantified CHF, and Diabetes mellitus who presented with worsening cough and shortness of breath for 3 days.   In the ED lactic acid noted to be elevated at 2.69.  Patient was tachycardic and tachypneic,  Chest x-ray noted cardiomegaly but no acute findings.  HPI/Subjective: The patient is alert and interactive.  She has no new complaints today.  She does continue to complain of chronic low back pain.  She is asking if we can proceed with a couple of procedures she was supposed to get in the outpatient setting but she cannot tell me exactly what these procedures are.  She specifically denies chest pain nausea vomiting or shortness of breath.  Assessment/Plan:  SIRS - lactic acidosis  CXR w/o focal infiltrate - UA not c/w UTI - no fever - no clear source of infection therefore stop abx and follow - suspect lactic acidosis due to [facility] related to severe uncontrolled DM/DKA - plan to complete 5 days of anabiotic therapy since it has been initiated - procalcitonin <0.10 strongly suggests this is SIRS and not sepsis   Severely uncontrolled DM2 w/ renal complications - possible DKA  tx w/ IV insulin protocol - required return to IV insulin after it was first discontinued due to rapidly climbing CBGs - now liberated from IV insulin again and CBGs climbing again - significant adjustments in insulin treatment regimen made today  Pulmonary Edema - Acute exacerbation chronic diastolic congestive heart failure Likely due to volume overload related to resuscitation for lactic acidosis/DKA in setting of diastolic CHF - diurese and follow  Acute Asthma exacerbation Absolutely no wheezing whatsoever at time of exam again today - follow with as needed nebulizer treatments - in  setting of severe hyperglycemia will taper steroids rapidly  Acute on chronic renal failure  Baseline crt not clear as patient just moved to [facility] in [facility] but she does report she has a degree of chronic kidney disease - creatinine appears to have stabilized - continue to follow trend  Mildly elevated tropnin  Most likely due to persisting tachycardia in setting of acute renal failure - patient has denied chest pain - appears to have peaked at 0.69 - no further inpatient workup planned  Morbid Obesity - Body mass index is 47.27 kg/(m^2).  Code Status: FULL Family Communication: Spoke with patient and son at bedside Disposition Plan: SDU until clear patient will not require returning to insulin drip  Consultants: none  Procedures: 5/23 - TTE - EF 65-70 % - no WMA - grade 1 diastolic dysfunction - unable to estimate pulmonary artery pressure  Antibiotics: Levaquin 5/22 Azithromycin 5/24 > Ceftriaxone 5/24 >  DVT prophylaxis: lovenox  Objective: Blood pressure 134/74, pulse 97, temperature 97.6 F (36.4 C), temperature source Oral, resp. rate 21, height 5\' 3"  (1.6 m), weight 121 kg (266 lb 12.1 oz), SpO2 98 %.  Intake/Output Summary (Last 24 hours) at 11/28/14 1154 Last data filed at 11/28/14 0932  Gross per 24 hour  Intake 1947.59 ml  Output   2371 ml  Net -423.41 ml   Exam: General: No acute respiratory distress - alert and oriented Lungs: Fine diffuse crackles with no wheeze - distant breath sounds due to body habitus Cardiovascular: Regular rate and rhythm without murmur gallop or rub with normal S1  and S2 but with distant heart sounds due to body habitus Abdomen: Nontender, obese, soft, bowel sounds positive, no rebound, no ascites, no appreciable mass Extremities: No significant cyanosis, or clubbing, 2+ edema bilateral lower extremities  Data Reviewed: Basic Metabolic Panel:  Recent Labs Lab 11/26/14 0200  11/26/14 1245 11/26/14 1838 11/27/14 0250  11/27/14 2201 11/28/14 0900  NA 137  < > 140 139 133* 142 140  K 4.0  < > 4.3 4.8 4.8 3.8 4.3  CL 100*  < > 107 108 104 103 104  CO2 18*  < > 23 21* 20* 28 25  GLUCOSE 565*  < > 268* 257* 465* 151* 240*  BUN 18  < > 18 19 22* 21* 20  CREATININE 1.62*  < > 1.24* 1.16* 1.27* 1.09* 1.05*  CALCIUM 8.6*  < > 8.8* 8.8* 8.9 9.0 9.1  MG 2.4  --   --   --   --   --   --   PHOS 4.6  --   --   --   --   --   --   < > = values in this interval not displayed.  CBC:  Recent Labs Lab 11/25/14 1816 11/26/14 0200 11/27/14 0250  WBC 7.8 8.4 10.8*  HGB 13.1 11.8* 11.2*  HCT 41.2 38.0 35.8*  MCV 85.5 87.8 85.6  PLT 207 201 189    Liver Function Tests:  Recent Labs Lab 11/26/14 0200 11/27/14 0250  AST 30 34  ALT 34 34  ALKPHOS 99 81  BILITOT 0.5 0.5  PROT 6.5 5.7*  ALBUMIN 3.4* 3.3*    Cardiac Enzymes:  Recent Labs Lab 11/25/14 1816 11/26/14 0200 11/26/14 0846 11/26/14 1640 11/27/14 0250  TROPONINI <0.03 <0.03 0.55* 0.69* 0.27*    CBG:  Recent Labs Lab 11/27/14 2352 11/28/14 0110 11/28/14 0419 11/28/14 0804 11/28/14 1130  GLUCAP 190* 172* 149* 199* 335*    Studies:   Recent x-ray studies have been reviewed in detail by the Attending Physician  Scheduled Meds:  Scheduled Meds: . antiseptic oral rinse  7 mL Mouth Rinse q12n4p  . aspirin  81 mg Oral Daily  . atorvastatin  40 mg Oral q1800  . azithromycin  500 mg Intravenous Q24H  . budesonide (PULMICORT) nebulizer solution  0.25 mg Nebulization BID  . cefTRIAXone (ROCEPHIN)  IV  1 g Intravenous Q24H  . chlorhexidine  15 mL Mouth Rinse BID  . dextromethorphan-guaiFENesin  1 tablet Oral BID  . enoxaparin (LOVENOX) injection  40 mg Subcutaneous Q24H  . insulin aspart  0-20 Units Subcutaneous TID WC  . insulin aspart protamine- aspart  75 Units Subcutaneous TID WC  . levalbuterol  1.25 mg Nebulization Q4H  . loratadine  10 mg Oral Daily  . methylPREDNISolone (SOLU-MEDROL) injection  40 mg Intravenous Q12H    . mometasone-formoterol  2 puff Inhalation BID  . montelukast  10 mg Oral QHS  . pantoprazole  40 mg Oral Daily  . sodium chloride  3 mL Intravenous Q12H  . topiramate  50 mg Oral BID    Time spent on care of this patient: 35 mins   Hunt Zajicek T , MD   [facility] Office  [phone] Pager - Text Page per Shea Evans as per below:  On-Call/Text Page:      [email or web address]      password [email or web address]  If 7PM-7AM, please contact night-coverage [email or web address] Password [email or web address] 11/28/2014, 11:54 AM   LOS: 3 days

## 2014-11-29 DIAGNOSIS — G8929 Other chronic pain: Secondary | ICD-10-CM | POA: Diagnosis present

## 2014-11-29 DIAGNOSIS — M542 Cervicalgia: Secondary | ICD-10-CM

## 2014-11-29 DIAGNOSIS — J80 Acute respiratory distress syndrome: Secondary | ICD-10-CM

## 2014-11-29 DIAGNOSIS — E781 Pure hyperglyceridemia: Secondary | ICD-10-CM

## 2014-11-29 DIAGNOSIS — E785 Hyperlipidemia, unspecified: Secondary | ICD-10-CM

## 2014-11-29 LAB — COMPREHENSIVE METABOLIC PANEL
ALT: 37 U/L (ref 14–54)
AST: 35 U/L (ref 15–41)
Albumin: 3.2 g/dL — ABNORMAL LOW (ref 3.5–5.0)
Alkaline Phosphatase: 72 U/L (ref 38–126)
Anion gap: 9 (ref 5–15)
BUN: 20 mg/dL (ref 6–20)
CO2: 28 mmol/L (ref 22–32)
CREATININE: 1.06 mg/dL — AB (ref 0.44–1.00)
Calcium: 9.2 mg/dL (ref 8.9–10.3)
Chloride: 104 mmol/L (ref 101–111)
GFR calc non Af Amer: 55 mL/min — ABNORMAL LOW (ref >60)
GLUCOSE: 83 mg/dL (ref 65–99)
Potassium: 3.3 mmol/L — ABNORMAL LOW (ref 3.5–5.1)
Sodium: 141 mmol/L (ref 135–145)
Total Bilirubin: 0.5 mg/dL (ref 0.3–1.2)
Total Protein: 5.8 g/dL — ABNORMAL LOW (ref 6.5–8.1)

## 2014-11-29 LAB — GLUCOSE, CAPILLARY
GLUCOSE-CAPILLARY: 70 mg/dL (ref 65–99)
GLUCOSE-CAPILLARY: 106 mg/dL — AB (ref 65–99)
GLUCOSE-CAPILLARY: 131 mg/dL — AB (ref 65–99)
GLUCOSE-CAPILLARY: 73 mg/dL (ref 65–99)
Glucose-Capillary: 85 mg/dL (ref 65–99)

## 2014-11-29 LAB — CBC
HCT: 36.4 % (ref 36.0–46.0)
Hemoglobin: 11.6 g/dL — ABNORMAL LOW (ref 12.0–15.0)
MCH: 27.2 pg (ref 26.0–34.0)
MCHC: 31.9 g/dL (ref 30.0–36.0)
MCV: 85.4 fL (ref 78.0–100.0)
Platelets: 211 10*3/uL (ref 150–400)
RBC: 4.26 MIL/uL (ref 3.87–5.11)
RDW: 15 % (ref 11.5–15.5)
WBC: 9.7 10*3/uL (ref 4.0–10.5)

## 2014-11-29 LAB — PROCALCITONIN: Procalcitonin: <0.1 ng/mL

## 2014-11-29 LAB — LACTIC ACID, PLASMA: Lactic Acid, Venous: 1.4 mmol/L (ref 0.5–2.0)

## 2014-11-29 MED ORDER — LORAZEPAM 2 MG/ML IJ SOLN
INTRAMUSCULAR | Status: AC
  Filled 2014-11-29: qty 1

## 2014-11-29 MED ORDER — AZITHROMYCIN 500 MG PO TABS
500.0000 mg | ORAL_TABLET | ORAL | Status: DC
  Administered 2014-11-29 – 2014-11-30 (×2): 500 mg via ORAL
  Filled 2014-11-29 (×2): qty 1

## 2014-11-29 MED ORDER — ASPIRIN EC 325 MG PO TBEC
325.0000 mg | DELAYED_RELEASE_TABLET | Freq: Every day | ORAL | Status: DC
  Administered 2014-11-29 – 2014-12-02 (×4): 325 mg via ORAL
  Filled 2014-11-29 (×4): qty 1

## 2014-11-29 MED ORDER — POTASSIUM CHLORIDE 10 MEQ/100ML IV SOLN: 10.0000 meq | INTRAVENOUS | Status: DC

## 2014-11-29 MED ORDER — FUROSEMIDE 40 MG PO TABS
40.0000 mg | ORAL_TABLET | Freq: Two times a day (BID) | ORAL | Status: DC
  Administered 2014-11-29 – 2014-11-30 (×3): 40 mg via ORAL
  Filled 2014-11-29 (×4): qty 1

## 2014-11-29 MED ORDER — LORAZEPAM 2 MG/ML IJ SOLN
1.0000 mg | Freq: Once | INTRAMUSCULAR | Status: AC
  Administered 2014-11-30: 1 mg via INTRAVENOUS
  Filled 2014-11-29: qty 1

## 2014-11-29 MED ORDER — CEFUROXIME AXETIL 500 MG PO TABS
500.0000 mg | ORAL_TABLET | Freq: Two times a day (BID) | ORAL | Status: DC
  Administered 2014-11-29 – 2014-11-30 (×2): 500 mg via ORAL
  Filled 2014-11-29 (×4): qty 1

## 2014-11-29 MED ORDER — POTASSIUM CHLORIDE CRYS ER 20 MEQ PO TBCR
40.0000 meq | EXTENDED_RELEASE_TABLET | Freq: Once | ORAL | Status: AC
  Administered 2014-11-29: 40 meq via ORAL
  Filled 2014-11-29: qty 2

## 2014-11-29 MED ORDER — NIACIN 250 MG PO TABS
250.0000 mg | ORAL_TABLET | Freq: Every day | ORAL | Status: DC
  Administered 2014-11-29: 250 mg via ORAL
  Filled 2014-11-29 (×2): qty 1

## 2014-11-29 NOTE — Progress Notes (Signed)
   11/29/14 1046  Clinical Encounter Type  Visited With Patient;Patient and family together;Health care provider  Visit Type Initial;Spiritual support  Referral From Nurse  Consult/Referral To [place] text;Prayer;Emotional   Initial visit with patient per RN referral.  Patient has strong spiritual connection.  Discussed Biblical text patient was using for spiritual support.  Patient was sitting on side of bed, and seemed to be in good spirits.  Chaplain empowered patient, allowing her to lead prayer.

## 2014-11-29 NOTE — Progress Notes (Signed)
UR COMPLETED  

## 2014-11-29 NOTE — Progress Notes (Signed)
RT placed pt on BIPAP at 2104. Pt was SOB when RT arrived and stated she wanted to go on BIPAP while sleeping. RN told RT at 2135 that she had removed BIPAP to give meds and patient complained she was not ready. RN stated patient seemed confused. RN said she would put patient back on at midnight. RT arrived at 0035 to give breathing treatment and pt is on 2L [address] [place]. Vitals: HR: 97, RR: 21, Sats: 93%. Pt is sleeping. No distress. No SOB. RT will monitor.

## 2014-11-29 NOTE — Progress Notes (Addendum)
Inpatient Diabetes Program Recommendations  AACE/ADA: New Consensus Statement on Inpatient Glycemic Control (2013)  Target Ranges:  Prepandial:   less than 140 mg/dL      Peak postprandial:   less than 180 mg/dL (1-2 hours)      Critically ill patients:  140 - 180 mg/dL   Review of Glycemic Control:  Results for Heidi [name], Heidi [name] (MRN [id number]) as of 11/29/2014 13:14  Ref. Range 11/28/2014 21:26 11/28/2014 23:27 11/29/2014 04:40 11/29/2014 07:43 11/29/2014 12:22  Glucose-Capillary Latest Ref Range: 65-99 mg/dL 87 75 85 70 106 (H)    Note CBG's trending down with d/c of IV steroids.  Consider reducing Novolog 70/30 mix back to 75 units tid with meals.  Thanks, Adah Perl, RN, BC-ADM Inpatient Diabetes Coordinator Pager [phone] (8a-5p)  1430 Addendum:  Spoke with patient.  She states that blood sugars are up and down at home and that she has had a difficult time controlling.  At home she takes Humulin 70/30 75 units tid.  She would like to be on Novolog 70/30 at discharge due to shorter action time of Novolog.  She states that she needs a PCP and is interested in getting better control of her blood sugars.  Will order "Outpatient diabetes education" according to protocol due to elevated CBG's.  Will continue to follow.  Thanks, Adah Perl, RN, MSN, BC-ADM
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
## 2014-11-30 ENCOUNTER — Inpatient Hospital Stay (HOSPITAL_COMMUNITY): Payer: Medicare Other

## 2014-11-30 LAB — GLUCOSE, CAPILLARY
GLUCOSE-CAPILLARY: 122 mg/dL — AB (ref 65–99)
Glucose-Capillary: 138 mg/dL — ABNORMAL HIGH (ref 65–99)
Glucose-Capillary: 215 mg/dL — ABNORMAL HIGH (ref 65–99)
Glucose-Capillary: 246 mg/dL — ABNORMAL HIGH (ref 65–99)
Glucose-Capillary: 70 mg/dL (ref 65–99)
Glucose-Capillary: 70 mg/dL (ref 65–99)

## 2014-11-30 LAB — CBC WITH DIFFERENTIAL/PLATELET
BASOS PCT: 0 % (ref 0–1)
Basophils Absolute: 0 10*3/uL (ref 0.0–0.1)
EOS ABS: 0.3 10*3/uL (ref 0.0–0.7)
EOS PCT: 3 % (ref 0–5)
HEMATOCRIT: 37.8 % (ref 36.0–46.0)
Hemoglobin: 12.2 g/dL (ref 12.0–15.0)
Lymphocytes Relative: 38 % (ref 12–46)
Lymphs Abs: 3.6 10*3/uL (ref 0.7–4.0)
MCH: 27.9 pg (ref 26.0–34.0)
MCHC: 32.3 g/dL (ref 30.0–36.0)
MCV: 86.5 fL (ref 78.0–100.0)
MONOS PCT: 7 % (ref 3–12)
Monocytes Absolute: 0.7 10*3/uL (ref 0.1–1.0)
NEUTROS ABS: 4.9 10*3/uL (ref 1.7–7.7)
NEUTROS PCT: 52 % (ref 43–77)
Platelets: 192 10*3/uL (ref 150–400)
RBC: 4.37 MIL/uL (ref 3.87–5.11)
RDW: 15.1 % (ref 11.5–15.5)
WBC: 9.5 10*3/uL (ref 4.0–10.5)

## 2014-11-30 LAB — COMPREHENSIVE METABOLIC PANEL
ALT: 40 U/L (ref 14–54)
AST: 36 U/L (ref 15–41)
Albumin: 3.1 g/dL — ABNORMAL LOW (ref 3.5–5.0)
Alkaline Phosphatase: 74 U/L (ref 38–126)
Anion gap: 11 (ref 5–15)
BILIRUBIN TOTAL: 0.1 mg/dL — AB (ref 0.3–1.2)
BUN: 18 mg/dL (ref 6–20)
CHLORIDE: 105 mmol/L (ref 101–111)
CO2: 24 mmol/L (ref 22–32)
Calcium: 8.8 mg/dL — ABNORMAL LOW (ref 8.9–10.3)
Creatinine, Ser: 1.13 mg/dL — ABNORMAL HIGH (ref 0.44–1.00)
GFR, EST AFRICAN AMERICAN: 59 mL/min — AB (ref >60)
GFR, EST NON AFRICAN AMERICAN: 51 mL/min — AB (ref >60)
GLUCOSE: 81 mg/dL (ref 65–99)
POTASSIUM: 3.5 mmol/L (ref 3.5–5.1)
SODIUM: 140 mmol/L (ref 135–145)
TOTAL PROTEIN: 5.6 g/dL — AB (ref 6.5–8.1)

## 2014-11-30 LAB — CULTURE, RESPIRATORY: CULTURE: NORMAL

## 2014-11-30 LAB — MAGNESIUM: Magnesium: 2.1 mg/dL (ref 1.7–2.4)

## 2014-11-30 LAB — CULTURE, RESPIRATORY W GRAM STAIN

## 2014-11-30 MED ORDER — INSULIN ASPART PROT & ASPART (70-30 MIX) 100 UNIT/ML ~~LOC~~ SUSP
82.0000 [IU] | Freq: Three times a day (TID) | SUBCUTANEOUS | Status: DC
  Administered 2014-11-30 (×2): 82 [IU] via SUBCUTANEOUS

## 2014-11-30 MED ORDER — POTASSIUM CHLORIDE CRYS ER 20 MEQ PO TBCR
40.0000 meq | EXTENDED_RELEASE_TABLET | Freq: Two times a day (BID) | ORAL | Status: AC
  Administered 2014-11-30 – 2014-12-01 (×3): 40 meq via ORAL
  Filled 2014-11-30 (×3): qty 2

## 2014-11-30 MED ORDER — LEVALBUTEROL HCL 1.25 MG/0.5ML IN NEBU
1.2500 mg | INHALATION_SOLUTION | Freq: Four times a day (QID) | RESPIRATORY_TRACT | Status: DC
  Administered 2014-11-30 (×3): 1.25 mg via RESPIRATORY_TRACT
  Filled 2014-11-30 (×5): qty 0.5

## 2014-11-30 MED ORDER — NIACIN ER 250 MG PO CPCR
250.0000 mg | ORAL_CAPSULE | Freq: Every day | ORAL | Status: DC
  Administered 2014-11-30 – 2014-12-01 (×2): 250 mg via ORAL
  Filled 2014-11-30 (×3): qty 1

## 2014-11-30 MED ORDER — LEVALBUTEROL HCL 0.63 MG/3ML IN NEBU
INHALATION_SOLUTION | RESPIRATORY_TRACT | Status: AC
  Administered 2014-11-30: 1.26 mg
  Filled 2014-11-30: qty 6

## 2014-11-30 MED ORDER — FUROSEMIDE 10 MG/ML IJ SOLN
80.0000 mg | Freq: Two times a day (BID) | INTRAMUSCULAR | Status: DC
  Administered 2014-12-01 – 2014-12-02 (×3): 80 mg via INTRAVENOUS
  Filled 2014-11-30 (×4): qty 8

## 2014-11-30 MED ORDER — DM-GUAIFENESIN ER 30-600 MG PO TB12
1.0000 | ORAL_TABLET | Freq: Two times a day (BID) | ORAL | Status: DC | PRN
  Filled 2014-11-30: qty 1

## 2014-11-30 MED ORDER — INSULIN ASPART PROT & ASPART (70-30 MIX) 100 UNIT/ML ~~LOC~~ SUSP: 78.0000 [IU] | Freq: Three times a day (TID) | SUBCUTANEOUS | Status: DC

## 2014-11-30 NOTE — Progress Notes (Signed)
Pt received at this time alert, verbal with no noted distress. She is on 2L of o2 via n/c for SOB. Pt ambulates standby assist with cane steady gait. Safety measures in place. Pt oriented to room. Will continue to monitor. Received report prior to pt arrival to unit.
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
## 2014-11-30 NOTE — Progress Notes (Signed)
Placed patient on CPAP auto mode max 18/min 5cmH20 with 2L 02 bleed in via medium full face mask.  Patient is tolerating well at this time.

## 2014-11-30 NOTE — Progress Notes (Signed)
Report called to RN on 4N. Pt aware of transfer. VS stable. No s/s of acute distress noted.

## 2014-12-01 ENCOUNTER — Inpatient Hospital Stay (HOSPITAL_COMMUNITY): Payer: Medicare Other

## 2014-12-01 DIAGNOSIS — R609 Edema, unspecified: Secondary | ICD-10-CM

## 2014-12-01 DIAGNOSIS — E1122 Type 2 diabetes mellitus with diabetic chronic kidney disease: Secondary | ICD-10-CM

## 2014-12-01 DIAGNOSIS — J45901 Unspecified asthma with (acute) exacerbation: Principal | ICD-10-CM

## 2014-12-01 DIAGNOSIS — N189 Chronic kidney disease, unspecified: Secondary | ICD-10-CM

## 2014-12-01 DIAGNOSIS — I5033 Acute on chronic diastolic (congestive) heart failure: Secondary | ICD-10-CM

## 2014-12-01 DIAGNOSIS — N179 Acute kidney failure, unspecified: Secondary | ICD-10-CM

## 2014-12-01 DIAGNOSIS — J810 Acute pulmonary edema: Secondary | ICD-10-CM

## 2014-12-01 DIAGNOSIS — N182 Chronic kidney disease, stage 2 (mild): Secondary | ICD-10-CM

## 2014-12-01 LAB — GLUCOSE, CAPILLARY
GLUCOSE-CAPILLARY: 52 mg/dL — AB (ref 65–99)
GLUCOSE-CAPILLARY: 44 mg/dL — AB (ref 65–99)
Glucose-Capillary: 71 mg/dL (ref 65–99)
Glucose-Capillary: 226 mg/dL — ABNORMAL HIGH (ref 65–99)
Glucose-Capillary: 174 mg/dL — ABNORMAL HIGH (ref 65–99)
Glucose-Capillary: 121 mg/dL — ABNORMAL HIGH (ref 65–99)

## 2014-12-01 LAB — BASIC METABOLIC PANEL
Anion gap: 9 (ref 5–15)
BUN: 16 mg/dL (ref 6–20)
CO2: 26 mmol/L (ref 22–32)
Calcium: 9.2 mg/dL (ref 8.9–10.3)
Chloride: 104 mmol/L (ref 101–111)
Creatinine, Ser: 1.23 mg/dL — ABNORMAL HIGH (ref 0.44–1.00)
GFR calc Af Amer: 53 mL/min — ABNORMAL LOW (ref >60)
GFR calc non Af Amer: 46 mL/min — ABNORMAL LOW (ref >60)
Glucose, Bld: 85 mg/dL (ref 65–99)
Potassium: 3.7 mmol/L (ref 3.5–5.1)
Sodium: 139 mmol/L (ref 135–145)

## 2014-12-01 MED ORDER — DEXTROSE 50 % IV SOLN
50.0000 mL | Freq: Once | INTRAVENOUS | Status: AC
  Administered 2014-12-01: 50 mL via INTRAVENOUS

## 2014-12-01 MED ORDER — OXYCODONE HCL 5 MG PO TABS
5.0000 mg | ORAL_TABLET | Freq: Four times a day (QID) | ORAL | Status: DC | PRN
  Administered 2014-12-01 – 2014-12-02 (×3): 5 mg via ORAL
  Filled 2014-12-01 (×3): qty 1

## 2014-12-01 MED ORDER — ATENOLOL 25 MG PO TABS
25.0000 mg | ORAL_TABLET | Freq: Every day | ORAL | Status: DC
  Administered 2014-12-01 – 2014-12-02 (×2): 25 mg via ORAL
  Filled 2014-12-01 (×2): qty 1

## 2014-12-01 MED ORDER — INSULIN ASPART PROT & ASPART (70-30 MIX) 100 UNIT/ML ~~LOC~~ SUSP
75.0000 [IU] | Freq: Three times a day (TID) | SUBCUTANEOUS | Status: DC
  Administered 2014-12-01 – 2014-12-02 (×3): 75 [IU] via SUBCUTANEOUS
  Filled 2014-12-01: qty 10

## 2014-12-01 MED ORDER — DEXTROSE 50 % IV SOLN
INTRAVENOUS | Status: AC
  Administered 2014-12-01: 50 mL via INTRAVENOUS
  Filled 2014-12-01: qty 50

## 2014-12-01 MED ORDER — HYDROCODONE-ACETAMINOPHEN 7.5-325 MG PO TABS
1.0000 | ORAL_TABLET | Freq: Once | ORAL | Status: AC
  Administered 2014-12-01: 1 via ORAL
  Filled 2014-12-01: qty 1

## 2014-12-01 NOTE — Evaluation (Signed)
Physical Therapy Evaluation Patient Details Name: Heidi [name] MRN: [id number] DOB: 1950/10/14 Today's Date: 12/01/2014   History of Present Illness  pt presents with SIRs.    Clinical Impression  Pt seems to be near her baseline level of mobility except for increased SOB.  On RA pt's O2 sats 96% at rest and 93% after ambulating.  No further PT needs at this time, will sign off.      Follow Up Recommendations No PT follow up;Supervision - Intermittent    Equipment Recommendations  None recommended by PT    Recommendations for Other Services       Precautions / Restrictions Precautions Precautions: None Restrictions Weight Bearing Restrictions: No      Mobility  Bed Mobility Overal bed mobility: Independent                Transfers Overall transfer level: Modified independent Equipment used: Quad cane                Ambulation/Gait Ambulation/Gait assistance: Modified independent (Device/Increase time) Ambulation Distance (Feet): 300 Feet Assistive device: Quad cane Gait Pattern/deviations: Step-through pattern;Decreased stride length     General Gait Details: pt moves slowly, but demonstrates safe use of [employer].  pt indicates she feels as though she is ambulating at baseline level except more SOB than usual.  pt ambulated on RA with O2 sats 96% prior to ambulating and 93% after ambulating.    Stairs            Wheelchair Mobility    Modified Rankin (Stroke Patients Only)       Balance Overall balance assessment: No apparent balance deficits (not formally assessed)                                           Pertinent Vitals/Pain Pain Assessment: No/denies pain    Home Living Family/patient expects to be discharged to:: Private residence Living Arrangements: Children Available Help at Discharge: Family;Available PRN/intermittently Type of Home: House Home Access: Level entry     Home Layout: One level Home  Equipment: Cane - quad      Prior Function Level of Independence: Independent with assistive device(s)               Hand Dominance        Extremity/Trunk Assessment   Upper Extremity Assessment: Overall WFL for tasks assessed           Lower Extremity Assessment: Overall WFL for tasks assessed      Cervical / Trunk Assessment: Normal  Communication   Communication: No difficulties  Cognition Arousal/Alertness: Awake/alert Behavior During Therapy: WFL for tasks assessed/performed Overall Cognitive Status: Within Functional Limits for tasks assessed                      General Comments      Exercises        Assessment/Plan    PT Assessment Patent does not need any further PT services  PT Diagnosis Difficulty walking   PT Problem List    PT Treatment Interventions     PT Goals (Current goals can be found in the Care Plan section) Acute Rehab PT Goals Patient Stated Goal: Back to normal. PT Goal Formulation: All assessment and education complete, DC therapy    Frequency     Barriers to discharge  Co-evaluation               End of Session   Activity Tolerance: Patient tolerated treatment well Patient left: in bed;with call bell/phone within reach (Sitting EOB) Nurse Communication: Mobility status         Time: 3736-6815 PT Time Calculation (min) (ACUTE ONLY): 22 min   Charges:   PT Evaluation $Initial PT Evaluation Tier I: 1 Procedure     PT G CodesCatarina Hartshorn, [place] 12/01/2014, 1:38 PM

## 2014-12-01 NOTE — Progress Notes (Signed)
Pt. BG is 121 after d50 was given. Will continue to monitor. Ruben Gottron, [place] 12/01/2014 10:44 PM

## 2014-12-01 NOTE — Progress Notes (Signed)
*  Preliminary Results* Bilateral lower extremity venous duplex completed. Bilateral lower extremities are negative for deep vein thrombosis. There is no evidence of Baker's cyst bilaterally.  12/01/2014  Maudry Mayhew, RVT, RDCS, RDMS
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
## 2014-12-01 NOTE — Progress Notes (Signed)
Hypoglycemic Event  CBG: 52  Treatment: 15 GM carbohydrate snack   Symptoms: None  Follow-up CBG: Time: 2139 CBG Result: 44  Possible Reasons for Event: Medication regimen  Comments/MD notified: Yes    Heidi [name], [name]  Remember to initiate Hypoglycemia Order Set & complete

## 2014-12-02 DIAGNOSIS — A419 Sepsis, unspecified organism: Secondary | ICD-10-CM

## 2014-12-02 DIAGNOSIS — E1165 Type 2 diabetes mellitus with hyperglycemia: Secondary | ICD-10-CM

## 2014-12-02 LAB — BASIC METABOLIC PANEL
Anion gap: 7 (ref 5–15)
BUN: 19 mg/dL (ref 6–20)
CALCIUM: 9.7 mg/dL (ref 8.9–10.3)
CO2: 28 mmol/L (ref 22–32)
CREATININE: 1.17 mg/dL — AB (ref 0.44–1.00)
Chloride: 103 mmol/L (ref 101–111)
GFR calc Af Amer: 57 mL/min — ABNORMAL LOW (ref >60)
GFR, EST NON AFRICAN AMERICAN: 49 mL/min — AB (ref >60)
GLUCOSE: 164 mg/dL — AB (ref 65–99)
POTASSIUM: 4.1 mmol/L (ref 3.5–5.1)
Sodium: 138 mmol/L (ref 135–145)

## 2014-12-02 LAB — GLUCOSE, CAPILLARY
GLUCOSE-CAPILLARY: 138 mg/dL — AB (ref 65–99)
Glucose-Capillary: 137 mg/dL — ABNORMAL HIGH (ref 65–99)
Glucose-Capillary: 224 mg/dL — ABNORMAL HIGH (ref 65–99)

## 2014-12-02 LAB — CULTURE, BLOOD (ROUTINE X 2)
Culture: NO GROWTH
Culture: NO GROWTH

## 2014-12-02 MED ORDER — OMEPRAZOLE 40 MG PO CPDR: 40.0000 mg | DELAYED_RELEASE_CAPSULE | Freq: Every day | ORAL | Status: DC

## 2014-12-02 MED ORDER — LISINOPRIL 5 MG PO TABS: 5.0000 mg | ORAL_TABLET | Freq: Every day | ORAL | Status: DC

## 2014-12-02 MED ORDER — MONTELUKAST SODIUM 10 MG PO TABS: 10.0000 mg | ORAL_TABLET | Freq: Every day | ORAL | Status: DC

## 2014-12-02 MED ORDER — BUMETANIDE 0.5 MG PO TABS: 0.5000 mg | ORAL_TABLET | Freq: Every day | ORAL | Status: DC

## 2014-12-02 MED ORDER — ATENOLOL 50 MG PO TABS: 50.0000 mg | ORAL_TABLET | Freq: Two times a day (BID) | ORAL | Status: DC

## 2014-12-02 NOTE — Progress Notes (Addendum)
Per MD order, try patient without oxygen.  O2 sats dropped to 82% on room air at rest, back up to 96% on 2L nasal cannula.  MD made aware.  Will continue to monitor. Cori Razor, RN

## 2014-12-02 NOTE — Progress Notes (Signed)
Discharge orders received.  Discharge instructions and follow-up appointments reviewed with the patient.  VSS upon discharge.  IV removed and education complete.  Pt set up with [facility].  Oxygen tank delivered.  Transported out via wheelchair family present.  Cori Razor RN
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
## 2014-12-02 NOTE — Progress Notes (Signed)
Placed patient on CPAP for the night.  Patient is tolerating well at this time. 

## 2014-12-02 NOTE — Care Management Note (Signed)
Case Management Note  Patient Details  Name: Heidi [name] MRN: [id number] Date of Birth: 18-Jun-1951  Subjective/Objective:                  Severe sepsis  Action/Plan:  Discharge planning new need for continuous oxygen at discharge. Expected Discharge Date:  12/02/14            Expected Discharge Plan:  Home/Self Care  In-House Referral:     Discharge planning Services  CM Consult  Post Acute Care Choice:    Choice offered to:     DME Arranged:  Oxygen DME Agency:  [place]:    [facility] Agency:     Status of Service:  Completed, signed off  Medicare Important Message Given:    Date Medicare IM Given:    Medicare IM give by:    Date Additional Medicare IM Given:    Additional Medicare Important Message give by:     If discussed at [place] of Stay Meetings, dates discussed:    Additional Comments:  Spoke to patient about home health needs answered all questions, Oxygen in room at time of discussion.Marland KitchenMarland KitchenNo additional  HH needs  or other  DME needed.     Corine Shelter, RN 12/02/2014, 5:07 PM

## 2014-12-11 ENCOUNTER — Encounter: Payer: Self-pay | Admitting: Family Medicine

## 2014-12-11 ENCOUNTER — Ambulatory Visit: Payer: Medicare Other | Attending: Family Medicine | Admitting: Family Medicine

## 2014-12-11 VITALS — BP 112/72 | HR 67 | Temp 97.8°F | Resp 18 | Ht 63.0 in | Wt 258.8 lb

## 2014-12-11 DIAGNOSIS — I509 Heart failure, unspecified: Secondary | ICD-10-CM | POA: Diagnosis not present

## 2014-12-11 DIAGNOSIS — I129 Hypertensive chronic kidney disease with stage 1 through stage 4 chronic kidney disease, or unspecified chronic kidney disease: Secondary | ICD-10-CM | POA: Insufficient documentation

## 2014-12-11 DIAGNOSIS — R6520 Severe sepsis without septic shock: Secondary | ICD-10-CM | POA: Insufficient documentation

## 2014-12-11 DIAGNOSIS — Z09 Encounter for follow-up examination after completed treatment for conditions other than malignant neoplasm: Secondary | ICD-10-CM | POA: Insufficient documentation

## 2014-12-11 DIAGNOSIS — E669 Obesity, unspecified: Secondary | ICD-10-CM | POA: Insufficient documentation

## 2014-12-11 DIAGNOSIS — Q6101 Congenital single renal cyst: Secondary | ICD-10-CM | POA: Insufficient documentation

## 2014-12-11 DIAGNOSIS — N182 Chronic kidney disease, stage 2 (mild): Secondary | ICD-10-CM

## 2014-12-11 DIAGNOSIS — E1122 Type 2 diabetes mellitus with diabetic chronic kidney disease: Secondary | ICD-10-CM

## 2014-12-11 DIAGNOSIS — Z7982 Long term (current) use of aspirin: Secondary | ICD-10-CM | POA: Diagnosis not present

## 2014-12-11 DIAGNOSIS — J45901 Unspecified asthma with (acute) exacerbation: Secondary | ICD-10-CM | POA: Insufficient documentation

## 2014-12-11 DIAGNOSIS — J189 Pneumonia, unspecified organism: Secondary | ICD-10-CM | POA: Diagnosis not present

## 2014-12-11 DIAGNOSIS — A419 Sepsis, unspecified organism: Secondary | ICD-10-CM | POA: Diagnosis not present

## 2014-12-11 DIAGNOSIS — Z7951 Long term (current) use of inhaled steroids: Secondary | ICD-10-CM | POA: Insufficient documentation

## 2014-12-11 DIAGNOSIS — E1121 Type 2 diabetes mellitus with diabetic nephropathy: Secondary | ICD-10-CM | POA: Insufficient documentation

## 2014-12-11 DIAGNOSIS — Z8701 Personal history of pneumonia (recurrent): Secondary | ICD-10-CM | POA: Diagnosis not present

## 2014-12-11 DIAGNOSIS — E11649 Type 2 diabetes mellitus with hypoglycemia without coma: Secondary | ICD-10-CM | POA: Diagnosis not present

## 2014-12-11 DIAGNOSIS — N179 Acute kidney failure, unspecified: Secondary | ICD-10-CM | POA: Insufficient documentation

## 2014-12-11 DIAGNOSIS — J4541 Moderate persistent asthma with (acute) exacerbation: Secondary | ICD-10-CM

## 2014-12-11 DIAGNOSIS — E1129 Type 2 diabetes mellitus with other diabetic kidney complication: Secondary | ICD-10-CM | POA: Diagnosis not present

## 2014-12-11 DIAGNOSIS — N189 Chronic kidney disease, unspecified: Secondary | ICD-10-CM

## 2014-12-11 DIAGNOSIS — I5033 Acute on chronic diastolic (congestive) heart failure: Secondary | ICD-10-CM

## 2014-12-11 DIAGNOSIS — I517 Cardiomegaly: Secondary | ICD-10-CM | POA: Insufficient documentation

## 2014-12-11 DIAGNOSIS — Z79899 Other long term (current) drug therapy: Secondary | ICD-10-CM | POA: Insufficient documentation

## 2014-12-11 DIAGNOSIS — Z6841 Body Mass Index (BMI) 40.0 and over, adult: Secondary | ICD-10-CM | POA: Diagnosis not present

## 2014-12-11 LAB — GLUCOSE, POCT (MANUAL RESULT ENTRY): POC GLUCOSE: 215 mg/dL — AB (ref 70–99)

## 2014-12-11 LAB — CBC WITH DIFFERENTIAL/PLATELET
BASOS ABS: 0.1 10*3/uL (ref 0.0–0.1)
Basophils Relative: 1 % (ref 0–1)
EOS ABS: 0.4 10*3/uL (ref 0.0–0.7)
EOS PCT: 5 % (ref 0–5)
HCT: 40.6 % (ref 36.0–46.0)
Hemoglobin: 12.7 g/dL (ref 12.0–15.0)
Lymphocytes Relative: 42 % (ref 12–46)
Lymphs Abs: 3.3 10*3/uL (ref 0.7–4.0)
MCH: 26.7 pg (ref 26.0–34.0)
MCHC: 31.3 g/dL (ref 30.0–36.0)
MCV: 85.3 fL (ref 78.0–100.0)
MONO ABS: 0.5 10*3/uL (ref 0.1–1.0)
MPV: 10.9 fL (ref 8.6–12.4)
Monocytes Relative: 6 % (ref 3–12)
NEUTROS ABS: 3.6 10*3/uL (ref 1.7–7.7)
Neutrophils Relative %: 46 % (ref 43–77)
Platelets: 263 10*3/uL (ref 150–400)
RBC: 4.76 MIL/uL (ref 3.87–5.11)
RDW: 15.3 % (ref 11.5–15.5)
WBC: 7.9 10*3/uL (ref 4.0–10.5)

## 2014-12-11 LAB — BASIC METABOLIC PANEL
BUN: 16 mg/dL (ref 6–23)
CO2: 30 mEq/L (ref 19–32)
Calcium: 9.7 mg/dL (ref 8.4–10.5)
Chloride: 101 mEq/L (ref 96–112)
Creat: 1.25 mg/dL — ABNORMAL HIGH (ref 0.50–1.10)
GLUCOSE: 195 mg/dL — AB (ref 70–99)
Potassium: 4.1 mEq/L (ref 3.5–5.3)
Sodium: 140 mEq/L (ref 135–145)

## 2014-12-11 MED ORDER — DM-GUAIFENESIN ER 30-600 MG PO TB12: 1.0000 | ORAL_TABLET | Freq: Two times a day (BID) | ORAL | Status: DC

## 2014-12-11 MED ORDER — FREESTYLE LANCETS MISC: Status: DC

## 2014-12-11 MED ORDER — INSULIN ASPART PROT & ASPART (70-30 MIX) 100 UNIT/ML PEN: 80.0000 [IU] | PEN_INJECTOR | Freq: Two times a day (BID) | SUBCUTANEOUS | Status: DC

## 2014-12-11 MED ORDER — ALBUTEROL SULFATE (2.5 MG/3ML) 0.083% IN NEBU: 2.5000 mg | INHALATION_SOLUTION | Freq: Four times a day (QID) | RESPIRATORY_TRACT | Status: DC | PRN

## 2014-12-11 MED ORDER — CALCIUM CARBONATE-VITAMIN D 500-200 MG-UNIT PO TABS: 1.0000 | ORAL_TABLET | Freq: Every day | ORAL | Status: DC

## 2014-12-11 MED ORDER — ATORVASTATIN CALCIUM 40 MG PO TABS: 40.0000 mg | ORAL_TABLET | Freq: Every day | ORAL | Status: DC

## 2014-12-11 MED ORDER — LISINOPRIL 5 MG PO TABS
5.0000 mg | ORAL_TABLET | Freq: Every day | ORAL | Status: DC
Start: 2014-12-11 — End: 2015-02-07

## 2014-12-11 MED ORDER — POTASSIUM CHLORIDE ER 10 MEQ PO TBCR: 10.0000 meq | EXTENDED_RELEASE_TABLET | Freq: Every day | ORAL | Status: DC

## 2014-12-11 MED ORDER — GLUCOSE BLOOD VI STRP: ORAL_STRIP | Status: DC

## 2014-12-11 NOTE — Progress Notes (Signed)
Patient here for hospital follow up with pneumonia and establish care with PCP.  Patient states she feels like "something's going on in the lung area" and notices wheezing at times.  Patient has chronic back pain as well.  Upper back pain is currently 6/10, and discomfort.  Hydrocodone helps the pain.  This morning patient's CBG 103, currently 115.  Patient states it is time for her to take more insulin.  Patient needs refill for [place] with D, Lipitor, Prilosec, Freestyle CBG strips & syringes, K-DUR, Lisinopril, Humulin. Patient states she needs nebulizer medication.

## 2014-12-11 NOTE — Patient Instructions (Signed)
Diabetes Mellitus and Food It is important for you to manage your blood sugar (glucose) level. Your blood glucose level can be greatly affected by what you eat. Eating healthier foods in the appropriate amounts throughout the day at about the same time each day will help you control your blood glucose level. It can also help slow or prevent worsening of your diabetes mellitus. Healthy eating may even help you improve the level of your blood pressure and reach or maintain a healthy weight.  HOW CAN FOOD AFFECT ME? Carbohydrates Carbohydrates affect your blood glucose level more than any other type of food. Your dietitian will help you determine how many carbohydrates to eat at each meal and teach you how to count carbohydrates. Counting carbohydrates is important to keep your blood glucose at a healthy level, especially if you are using insulin or taking certain medicines for diabetes mellitus. Alcohol Alcohol can cause sudden decreases in blood glucose (hypoglycemia), especially if you use insulin or take certain medicines for diabetes mellitus. Hypoglycemia can be a life-threatening condition. Symptoms of hypoglycemia (sleepiness, dizziness, and disorientation) are similar to symptoms of having too much alcohol.  If your health care provider has given you approval to drink alcohol, do so in moderation and use the following guidelines:  Women should not have more than one drink per day, and men should not have more than two drinks per day. One drink is equal to:  12 oz of beer.  5 oz of wine.  1 oz of hard liquor.  Do not drink on an empty stomach.  Keep yourself hydrated. Have water, diet soda, or unsweetened iced tea.  Regular soda, juice, and other mixers might contain a lot of carbohydrates and should be counted. WHAT FOODS ARE NOT RECOMMENDED? As you make food choices, it is important to remember that all foods are not the same. Some foods have fewer nutrients per serving than other  foods, even though they might have the same number of calories or carbohydrates. It is difficult to get your body what it needs when you eat foods with fewer nutrients. Examples of foods that you should avoid that are high in calories and carbohydrates but low in nutrients include:  Trans fats (most processed foods list trans fats on the Nutrition Facts label).  Regular soda.  Juice.  Candy.  Sweets, such as cake, pie, doughnuts, and cookies.  Fried foods. WHAT FOODS CAN I EAT? Have nutrient-rich foods, which will nourish your body and keep you healthy. The food you should eat also will depend on several factors, including:  The calories you need.  The medicines you take.  Your weight.  Your blood glucose level.  Your blood pressure level.  Your cholesterol level. You also should eat a variety of foods, including:  Protein, such as meat, poultry, fish, tofu, nuts, and seeds (lean animal proteins are best).  Fruits.  Vegetables.  Dairy products, such as milk, cheese, and yogurt (low fat is best).  Breads, grains, pasta, cereal, rice, and beans.  Fats such as olive oil, trans fat-free margarine, canola oil, avocado, and olives. DOES EVERYONE WITH DIABETES MELLITUS HAVE THE SAME MEAL PLAN? Because every person with diabetes mellitus is different, there is not one meal plan that works for everyone. It is very important that you meet with a dietitian who will help you create a meal plan that is just right for you. Document Released: 03/19/2005 Document Revised: 06/27/2013 Document Reviewed: 05/19/2013 Exit[employer] Patient Information 2015 [employer]. This   information is not intended to replace advice given to you by your health care provider. Make sure you discuss any questions you have with your health care provider.  

## 2014-12-11 NOTE — Progress Notes (Signed)
Subjective:    Patient ID: Heidi [name], female    DOB: 01-16-1951, 64 y.o.   MRN: [id number]  HPI  Admit Date: 11/25/14 Discharge Date: 12/02/14  Heidi [name] is a 64 year old female with a history of Type 2 DM, Asthma,  Chronic renal failure,CHF, Obesity who presented to the ED at [facility] with cough, fever, shortness of breath and was found to be tachycardic and tachypneic on presentation.  She met the criteria for SIRS as she also had elevated lactate at 2.69 and she was admitted to step down unit.  chest x-ray revealed cardiomegaly but no acute cardiopulmonary process.She was placed on IV Lasix and also on steroids, nebulizer treatments and IV Levaquin. Blood cultures, H1N1, Influenza were negative.She had mildly elevated troponin which peaked at 0.09 and no further ischemic work up was scheduled, EKG revealed NSR and multiform PVCs, 2d echo revealed EF 65-70%, no regional wall motion abnormalities, grade  1 Diastolic dysfunction.  Her condition improved and she was subsequently discharged with recommendation for outpatient follow up.         Interval History: Sometimes she feels fine and at other times she has sputum in her lungs which she has to cough up. Mucinex helps some; denies chest pains, but does have some dyspnea on mild exertion. Blood sugar log reviewed and reveals fasting blood sugars of 100 to 120s and random blood sugar between 70 and 90. She complains of being hypoglycemic and feeling jittery and sometimes has to take her Humalog 75 units twice a day rather than 3 times daily. She states she is on such high-dose because her sugars have been high in the past. She recently relocated here from [facility]; she is needing a PCP. She brings in a copy of a thyroid ultrasound which reveals hypoechoic solid nodule with calcification within the mid to inferior pole of the left thyroid lobe, FNA recommended. Ct abdomen reveals 6.0 cm left kidney cyst. Order placed by PCP for  FNA.  Past Medical History  Diagnosis Date  . Asthma   . Hypertension   . Diabetes mellitus without complication     Past Surgical History  Procedure Laterality Date  . Abdominal hysterectomy      History   Social History  . Marital Status: Single    Spouse Name: N/A  . Number of Children: N/A  . Years of Education: N/A   Occupational History  . Not on file.   Social History Main Topics  . Smoking status: Never Smoker   . Smokeless tobacco: Not on file  . Alcohol Use: No  . Drug Use: No  . Sexual Activity: Not on file   Other Topics Concern  . Not on file   Social History Narrative    No Known Allergies  Current Outpatient Prescriptions on File Prior to Visit  Medication Sig Dispense Refill  . albuterol (PROVENTIL HFA;VENTOLIN HFA) 108 (90 BASE) MCG/ACT inhaler Inhale 2 puffs into the lungs every 6 (six) hours as needed for wheezing or shortness of breath.     Marland Kitchen aspirin 81 MG chewable tablet 81 mg daily.    Marland Kitchen atenolol (TENORMIN) 50 MG tablet Take 1 tablet (50 mg total) by mouth 2 (two) times daily. 60 tablet 3  . bumetanide (BUMEX) 0.5 MG tablet Take 1 tablet (0.5 mg total) by mouth daily. 30 tablet 2  . Fluticasone-Salmeterol (ADVAIR) 250-50 MCG/DOSE AEPB Inhale 1 puff into the lungs 2 (two) times daily.    Marland Kitchen guaifenesin (ROBITUSSIN)  100 MG/5ML syrup Take 200 mg by mouth 3 (three) times daily as needed for cough.    . hydrochlorothiazide (HYDRODIURIL) 25 MG tablet Take 25 mg by mouth daily.  1  . HYDROcodone-acetaminophen (NORCO) 7.5-325 MG per tablet Take 1 tablet by mouth every 6 (six) hours as needed for moderate pain or severe pain.   0  . loratadine (CLARITIN) 10 MG tablet Take 10 mg by mouth daily.  1  . montelukast (SINGULAIR) 10 MG tablet Take 1 tablet (10 mg total) by mouth daily. 30 tablet 1  . nitroGLYCERIN (NITROSTAT) 0.3 MG SL tablet Place 0.3 mg under the tongue every 5 (five) minutes as needed for chest pain.     Marland Kitchen omega-3 acid ethyl esters  (LOVAZA) 1 G capsule Take 1,000 mg by mouth 2 (two) times daily.    Marland Kitchen omeprazole (PRILOSEC) 40 MG capsule Take 1 capsule (40 mg total) by mouth daily. 30 capsule 1  . topiramate (TOPAMAX) 50 MG tablet Take 50 mg by mouth 2 (two) times daily.  2  . benzonatate (TESSALON) 100 MG capsule Take 100 mg by mouth daily as needed for cough.      No current facility-administered medications on file prior to visit.     Review of Systems  Constitutional: Negative for activity change, appetite change and fatigue.  HENT: Negative for congestion, sinus pressure and sore throat.   Eyes: Negative for visual disturbance.  Respiratory: Positive for cough and shortness of breath. Negative for chest tightness and wheezing.   Cardiovascular: Negative for chest pain and palpitations.  Gastrointestinal: Negative for abdominal pain, constipation and abdominal distention.  Endocrine: Negative for polydipsia.  Genitourinary: Negative for dysuria and frequency.  Musculoskeletal: Negative for back pain and arthralgias.  Skin: Negative for rash.  Neurological: Negative for tremors, light-headedness and numbness.  Hematological: Does not bruise/bleed easily.  Psychiatric/Behavioral: Negative for behavioral problems and agitation.         Objective: Filed Vitals:   12/11/14 0935  BP: 112/72  Pulse: 67  Temp: 97.8 F (36.6 C)  TempSrc: Oral  Resp: 18  Height: 5' 3"  (1.6 m)  Weight: 258 lb 12.8 oz (117.391 kg)  SpO2: 95%      Physical Exam  Constitutional: She is oriented to person, place, and time. She appears well-developed and well-nourished. No distress.  Obese  HENT:  Head: Normocephalic.  Right Ear: External ear normal.  Left Ear: External ear normal.  Nose: Nose normal.  Mouth/Throat: Oropharynx is clear and moist.  Eyes: Conjunctivae and EOM are normal. Pupils are equal, round, and reactive to light.  Neck: Normal range of motion. No JVD present.  Cardiovascular: Normal rate, regular rhythm,  normal heart sounds and intact distal pulses.  Exam reveals no gallop.   No murmur heard. Pulmonary/Chest: Effort normal and breath sounds normal. No respiratory distress. She has no wheezes. She has no rales. She exhibits no tenderness.  Abdominal: Soft. Bowel sounds are normal. She exhibits no distension and no mass. There is no tenderness.  Musculoskeletal: Normal range of motion. She exhibits no edema or tenderness.  Neurological: She is alert and oriented to person, place, and time. She has normal reflexes.  Skin: Skin is warm and dry. She is not diaphoretic.  Psychiatric: She has a normal mood and affect.      Transthoracic Echocardiography  Patient:  Heidi, [name] MR #:    [id number] Study Date: 11/26/2014 Gender:   F Age:    27 Height:   160  cm Weight:   118.4 kg BSA:    2.36 m^2 Pt. Status: Room:    Heidi [name], Heidi [name] PERFORMING  [facility], Inpatient SONOGRAPHER Heidi [name], Heidi [name]  cc:  ------------------------------------------------------------------- LV EF: 65% -  70%  ------------------------------------------------------------------- Indications:   Cardiomegaly (429.3).  ------------------------------------------------------------------- History:  PMH: Asthma. Tachypnea. Tachycardia. Dyspnea and fever. Congestive heart failure. Risk factors: Hypertension. Diabetes mellitus.  ------------------------------------------------------------------- Study Conclusions  - Left ventricle: The cavity size was normal. Systolic function was vigorous. The estimated ejection fraction was in the range of 65% to 70%. Wall motion was normal; there were no regional wall motion abnormalities. Doppler parameters are consistent with abnormal left ventricular relaxation (grade 1  diastolic dysfunction). There was no evidence of elevated ventricular filling pressure by Doppler parameters. - Aortic valve: Structurally normal valve. Trileaflet. There was no regurgitation. - Aortic root: The aortic root was normal in size. - Ascending aorta: The ascending aorta was normal in size. - Mitral valve: Structurally normal valve. There was no significant regurgitation. - Left atrium: The atrium was normal in size. - Right ventricle: Systolic function was normal. - Right atrium: The atrium was normal in size. - Tricuspid valve: There was no significant regurgitation. - Pulmonic valve: There was no regurgitation. - Pericardium, extracardiac: The pericardium was normal in appearance.  Transthoracic echocardiography. M-mode, complete 2D, spectral Doppler, and color Doppler. Birthdate: Patient birthdate: 09-09-1950. Age: Patient is 64 yr old. Sex: Gender: female. BMI: 46.2 kg/m^2. Blood pressure:   114/54 Patient status: Inpatient. Study date: Study date: 11/26/2014. Study time: 10:58 AM. Location: ICU/CCU  -------------------------------------------------------------------  ------------------------------------------------------------------- Left ventricle: The cavity size was normal. Systolic function was vigorous. The estimated ejection fraction was in the range of 65% to 70%. Wall motion was normal; there were no regional wall motion abnormalities. The transmitral flow pattern was normal. The deceleration time of the early transmitral flow velocity was normal. The pulmonary vein flow pattern was normal. The tissue Doppler parameters were normal. Doppler parameters are consistent with abnormal left ventricular relaxation (grade 1 diastolic dysfunction). There was no evidence of elevated ventricular filling pressure by Doppler parameters.  ------------------------------------------------------------------- Aortic valve:  Structurally normal  valve. Trileaflet. Cusp separation was normal. Doppler: Transvalvular velocity was within the normal range. There was no stenosis. There was no regurgitation.  ------------------------------------------------------------------- Aorta: Aortic root: The aortic root was normal in size. Ascending aorta: The ascending aorta was normal in size.  ------------------------------------------------------------------- Mitral valve:  Structurally normal valve.  Leaflet separation was normal. Doppler: Transvalvular velocity was within the normal range. There was no evidence for stenosis. There was no significant regurgitation.  Peak gradient (D): 5 mm Hg.  ------------------------------------------------------------------- Left atrium: The atrium was normal in size.  ------------------------------------------------------------------- Right ventricle: The cavity size was normal. Wall thickness was normal. Systolic function was normal.  ------------------------------------------------------------------- Pulmonic valve:  Structurally normal valve.  Cusp separation was normal. Doppler: Transvalvular velocity was within the normal range. There was no regurgitation.  ------------------------------------------------------------------- Tricuspid valve:  Structurally normal valve.  Leaflet separation was normal. Doppler: Transvalvular velocity was within the normal range. There was no significant regurgitation.  ------------------------------------------------------------------- Pulmonary artery:  Systolic pressure could not be accurately estimated.  ------------------------------------------------------------------- Right atrium: The atrium was normal in size.  ------------------------------------------------------------------- Pericardium: The pericardium was normal in appearance.  -------------------------------------------------------------------      Assessment &  Plan:  64 year old female with a history of  Type 2 DM, Asthma,  Chronic renal failure,CHF, Obesity  Recently managed for respiratory failure secondary to acute asthma exacerbation.  Type 2 diabetes mellitus: I will switch from Humalog 70/30 to NovoLin 70/30 as the former is a non-formulary Given her recent complaints of hypoglycemia I am placing her on 80 units twice daily and we'll review her blood sugar log at her next office visit.  Community-acquired pneumonia: Resolved. Prescribed Mucinex to help with expectoration.  Acute asthma exacerbation: Resolved.  Congestive heart failure: No evidence of fluid overload at this time. Advised to limit fluids to 2 L per day, and check daily weights.  Acute on chronic renal failure: Complete metabolic panel sent off to assess for improvement Advised to avoid nephrotoxins.  Severe sepsis: CBC sent off today. Refill prescription for albuterol nebulizer

## 2014-12-11 NOTE — Progress Notes (Signed)
Quick Note:  Labs addressed at office as well as medications and patient was made aware. ______ 

## 2014-12-12 ENCOUNTER — Telehealth: Payer: Self-pay

## 2014-12-12 DIAGNOSIS — N182 Chronic kidney disease, stage 2 (mild): Principal | ICD-10-CM

## 2014-12-12 DIAGNOSIS — E1122 Type 2 diabetes mellitus with diabetic chronic kidney disease: Secondary | ICD-10-CM

## 2014-12-12 NOTE — Telephone Encounter (Signed)
-----   Message from Arnoldo Morale, MD sent at 12/11/2014 10:55 PM EDT ----- Cbc is normal, mildly elevated creatinine likely from Lasix; will monitor renal function.

## 2014-12-12 NOTE — Telephone Encounter (Signed)
Nurse called patient, reached voicemail. Left message for patient to call Kouper Spinella at [phone].   

## 2014-12-13 MED ORDER — INSULIN PEN NEEDLE 29G X 12.7MM MISC: 0.8000 mL | Freq: Two times a day (BID) | Status: DC

## 2014-12-13 NOTE — Telephone Encounter (Signed)
-----   Message from Arnoldo Morale, MD sent at 12/11/2014 10:55 PM EDT ----- Cbc is normal, mildly elevated creatinine likely from Lasix; will monitor renal function.

## 2014-12-13 NOTE — Telephone Encounter (Signed)
Nurse called patient, patient verified date of birth. Patient aware of normal CBC, and mildly elevated creatinine likely from lasix. Patient aware of monitoring kidney function. Patient reports needing pen needles for insulin. Nurse will send prescription to pharmacy. Patient voices understanding and has no questions at this time.

## 2014-12-18 ENCOUNTER — Ambulatory Visit: Payer: Medicare Other | Attending: Family Medicine | Admitting: Family Medicine

## 2014-12-18 ENCOUNTER — Encounter: Payer: Self-pay | Admitting: Family Medicine

## 2014-12-18 VITALS — BP 116/67 | HR 68 | Temp 98.0°F | Resp 18 | Ht 63.0 in | Wt 256.6 lb

## 2014-12-18 DIAGNOSIS — Z794 Long term (current) use of insulin: Secondary | ICD-10-CM | POA: Diagnosis not present

## 2014-12-18 DIAGNOSIS — E1122 Type 2 diabetes mellitus with diabetic chronic kidney disease: Secondary | ICD-10-CM | POA: Insufficient documentation

## 2014-12-18 DIAGNOSIS — N179 Acute kidney failure, unspecified: Secondary | ICD-10-CM | POA: Diagnosis not present

## 2014-12-18 DIAGNOSIS — E041 Nontoxic single thyroid nodule: Secondary | ICD-10-CM | POA: Diagnosis not present

## 2014-12-18 DIAGNOSIS — N189 Chronic kidney disease, unspecified: Secondary | ICD-10-CM

## 2014-12-18 DIAGNOSIS — N182 Chronic kidney disease, stage 2 (mild): Secondary | ICD-10-CM | POA: Diagnosis not present

## 2014-12-18 DIAGNOSIS — I5033 Acute on chronic diastolic (congestive) heart failure: Secondary | ICD-10-CM | POA: Insufficient documentation

## 2014-12-18 LAB — GLUCOSE, POCT (MANUAL RESULT ENTRY): POC Glucose: 88 mg/dl (ref 70–99)

## 2014-12-18 MED ORDER — CALCIUM CARBONATE-VITAMIN D 500-200 MG-UNIT PO TABS: 1.0000 | ORAL_TABLET | Freq: Every day | ORAL | Status: DC

## 2014-12-18 MED ORDER — INSULIN ASPART PROT & ASPART (70-30 MIX) 100 UNIT/ML PEN: 75.0000 [IU] | PEN_INJECTOR | Freq: Three times a day (TID) | SUBCUTANEOUS | Status: DC

## 2014-12-18 MED ORDER — GLUCOSE BLOOD VI STRP: ORAL_STRIP | Status: DC

## 2014-12-18 NOTE — Progress Notes (Signed)
Subjective:    Patient ID: Heidi [name], female    DOB: January 05, 1951, 64 y.o.   MRN: [id number]  HPI  64 year old female with a history of Type 2 DM, Asthma,  Chronic renal failure,CHF, Obesity  Recently managed for respiratory failure secondary to acute asthma exacerbation.  At her last office visit she had complained of hypoglycemia and so her long-acting insulin was reduced to 80 units twice daily but she brings in her blood sugar log which reveals she had some hyperglycemia in the 300s and so she went up to 80 units 3 times daily.  Her log reveals fasting sugar at 104 and random sugar of 88 for yesterday.  She had also brought in a copy of a thyroid ultrasound which revealed hypoechoic solid nodule with calcification within the mid to inferior pole of the left thyroid lobe, FNA recommended. Ct abdomen reveals 6.0 cm left kidney cyst. She is interested in proceeding with further work up of the thyroid; she has no previous history of thyroid disease.   Past Medical History  Diagnosis Date  . Asthma   . Hypertension   . Diabetes mellitus without complication     Past Surgical History  Procedure Laterality Date  . Abdominal hysterectomy      History   Social History  . Marital Status: Single    Spouse Name: N/A  . Number of Children: N/A  . Years of Education: N/A   Occupational History  . Not on file.   Social History Main Topics  . Smoking status: Never Smoker   . Smokeless tobacco: Not on file  . Alcohol Use: No  . Drug Use: No  . Sexual Activity: Not on file   Other Topics Concern  . Not on file   Social History Narrative    No Known Allergies  Current Outpatient Prescriptions on File Prior to Visit  Medication Sig Dispense Refill  . albuterol (PROVENTIL HFA;VENTOLIN HFA) 108 (90 BASE) MCG/ACT inhaler Inhale 2 puffs into the lungs every 6 (six) hours as needed for wheezing or shortness of breath.     Marland Kitchen albuterol (PROVENTIL) (2.5 MG/3ML) 0.083% nebulizer  solution Take 3 mLs (2.5 mg total) by nebulization every 6 (six) hours as needed for wheezing or shortness of breath. 150 mL 1  . aspirin 81 MG chewable tablet 81 mg daily.    Marland Kitchen atenolol (TENORMIN) 50 MG tablet Take 1 tablet (50 mg total) by mouth 2 (two) times daily. 60 tablet 3  . atorvastatin (LIPITOR) 40 MG tablet Take 1 tablet (40 mg total) by mouth daily at 6 PM. 30 tablet 2  . bumetanide (BUMEX) 0.5 MG tablet Take 1 tablet (0.5 mg total) by mouth daily. 30 tablet 2  . dextromethorphan-guaiFENesin (MUCINEX DM) 30-600 MG per 12 hr tablet Take 1 tablet by mouth 2 (two) times daily. 20 tablet 1  . Fluticasone-Salmeterol (ADVAIR) 250-50 MCG/DOSE AEPB Inhale 1 puff into the lungs 2 (two) times daily.    Marland Kitchen guaifenesin (ROBITUSSIN) 100 MG/5ML syrup Take 200 mg by mouth 3 (three) times daily as needed for cough.    . hydrochlorothiazide (HYDRODIURIL) 25 MG tablet Take 25 mg by mouth daily.  1  . HYDROcodone-acetaminophen (NORCO) 7.5-325 MG per tablet Take 1 tablet by mouth every 6 (six) hours as needed for moderate pain or severe pain.   0  . Insulin Pen Needle (BD ULTRA-FINE PEN NEEDLES) 29G X 12.7MM MISC 0.8 mLs by Does not apply route 2 (two) times daily  with a meal. 60 each 3  . Lancets (FREESTYLE) lancets Use as instructed 100 each 12  . lisinopril (PRINIVIL,ZESTRIL) 5 MG tablet Take 1 tablet (5 mg total) by mouth daily. 30 tablet 0  . loratadine (CLARITIN) 10 MG tablet Take 10 mg by mouth daily.  1  . montelukast (SINGULAIR) 10 MG tablet Take 1 tablet (10 mg total) by mouth daily. 30 tablet 1  . nitroGLYCERIN (NITROSTAT) 0.3 MG SL tablet Place 0.3 mg under the tongue every 5 (five) minutes as needed for chest pain.     Marland Kitchen omega-3 acid ethyl esters (LOVAZA) 1 G capsule Take 1,000 mg by mouth 2 (two) times daily.    Marland Kitchen omeprazole (PRILOSEC) 40 MG capsule Take 1 capsule (40 mg total) by mouth daily. 30 capsule 1  . potassium chloride (K-DUR) 10 MEQ tablet Take 1 tablet (10 mEq total) by mouth daily.  30 tablet 2  . topiramate (TOPAMAX) 50 MG tablet Take 50 mg by mouth 2 (two) times daily.  2  . benzonatate (TESSALON) 100 MG capsule Take 100 mg by mouth daily as needed for cough.      No current facility-administered medications on file prior to visit.    Review of Systems  General: negative for fever, weight loss, appetite change Eyes: no visual symptoms. ENT: no ear symptoms, no sinus tenderness, no nasal congestion or sore throat. Neck: no pain  Respiratory: no wheezing, shortness of breath, cough Cardiovascular: no chest pain, no dyspnea on exertion, positive for pedal edema, no orthopnea. Gastrointestinal: no abdominal pain, no diarrhea, no constipation Genito-Urinary: no urinary frequency, no dysuria, no polyuria. Hematologic: no bruising Endocrine: no cold or heat intolerance Neurological: no headaches, no seizures, no tremors Musculoskeletal: no joint pains, no joint swelling Skin: no pruritus, no rash. Psychological: no depression, no anxiety,        Objective: Filed Vitals:   12/18/14 1029  BP: 116/67  Pulse: 68  Temp: 98 F (36.7 C)  TempSrc: Oral  Resp: 18  Height: 5\' 3"  (1.6 m)  Weight: 256 lb 9.6 oz (116.393 kg)  SpO2: 93%      Physical Exam  Constitutional: obese  Eyes: PERRLA HEENT: Head is atraumatic, normal sinuses, normal oropharynx, normal appearing tonsils and palate, tympanic membrane is normal bilaterally. Neck: normal range of motion, no thyromegaly, no JVD Cardiovascular: normal rate and rhythm, normal heart sounds, no murmurs, rub or gallop, no pedal edema Respiratory: clear to auscultation bilaterally, no wheezes, no rales, no rhonchi Abdomen: soft, not tender to palpation, normal bowel sounds, no enlarged organs Extremities: Full ROM, no tenderness in joints   CMP Latest Ref Rng 12/11/2014 12/02/2014 12/01/2014  Glucose 70 - 99 mg/dL 195(H) 164(H) 85  BUN 6 - 23 mg/dL 16 19 16   Creatinine 0.50 - 1.10 mg/dL 1.25(H) 1.17(H) 1.23(H)    Sodium 135 - 145 mEq/L 140 138 139  Potassium 3.5 - 5.3 mEq/L 4.1 4.1 3.7  Chloride 96 - 112 mEq/L 101 103 104  CO2 19 - 32 mEq/L 30 28 26   Calcium 8.4 - 10.5 mg/dL 9.7 9.7 9.2  Total Protein 6.5 - 8.1 g/dL - - -  Total Bilirubin 0.3 - 1.2 mg/dL - - -  Alkaline Phos 38 - 126 U/L - - -  AST 15 - 41 U/L - - -  ALT 14 - 54 U/L - - -          Assessment & Plan:  64 year old female with a history of Type  2 DM, Asthma,  Chronic renal failure,CHF, Obesity  Recently managed for respiratory failure secondary to acute asthma exacerbation now doing well and is here for a follow up.  Type 2 diabetes mellitus: Due to hyperglycemia reported by the patient I am increasing her dose of Novolin 70/30 back to 3 time daily dosing of 75 units  Which she was previously on and she has been advised to keep a strict blood sugar log which will be reviewed at her next visit   Asthma exacerbation: Resolved.  Congestive heart failure: No evidence of fluid overload at this time. Advised to limit fluids to 2 L per day, and check daily weights.  Acute on chronic renal failure: Complete metabolic panel will be sent off to assess for improvement at next OV Advised to avoid nephrotoxins.  Thyroid nodule: Referred for thyroid ultrasound an/or biopsy. Will follow up on results at her next visit.  This note has been created with Surveyor, quantity. Any transcriptional errors are unintentional.

## 2014-12-18 NOTE — Patient Instructions (Signed)
Diabetes Mellitus and Food It is important for you to manage your blood sugar (glucose) level. Your blood glucose level can be greatly affected by what you eat. Eating healthier foods in the appropriate amounts throughout the day at about the same time each day will help you control your blood glucose level. It can also help slow or prevent worsening of your diabetes mellitus. Healthy eating may even help you improve the level of your blood pressure and reach or maintain a healthy weight.  HOW CAN FOOD AFFECT ME? Carbohydrates Carbohydrates affect your blood glucose level more than any other type of food. Your dietitian will help you determine how many carbohydrates to eat at each meal and teach you how to count carbohydrates. Counting carbohydrates is important to keep your blood glucose at a healthy level, especially if you are using insulin or taking certain medicines for diabetes mellitus. Alcohol Alcohol can cause sudden decreases in blood glucose (hypoglycemia), especially if you use insulin or take certain medicines for diabetes mellitus. Hypoglycemia can be a life-threatening condition. Symptoms of hypoglycemia (sleepiness, dizziness, and disorientation) are similar to symptoms of having too much alcohol.  If your health care provider has given you approval to drink alcohol, do so in moderation and use the following guidelines:  Women should not have more than one drink per day, and men should not have more than two drinks per day. One drink is equal to:  12 oz of beer.  5 oz of wine.  1 oz of hard liquor.  Do not drink on an empty stomach.  Keep yourself hydrated. Have water, diet soda, or unsweetened iced tea.  Regular soda, juice, and other mixers might contain a lot of carbohydrates and should be counted. WHAT FOODS ARE NOT RECOMMENDED? As you make food choices, it is important to remember that all foods are not the same. Some foods have fewer nutrients per serving than other  foods, even though they might have the same number of calories or carbohydrates. It is difficult to get your body what it needs when you eat foods with fewer nutrients. Examples of foods that you should avoid that are high in calories and carbohydrates but low in nutrients include:  Trans fats (most processed foods list trans fats on the Nutrition Facts label).  Regular soda.  Juice.  Candy.  Sweets, such as cake, pie, doughnuts, and cookies.  Fried foods. WHAT FOODS CAN I EAT? Have nutrient-rich foods, which will nourish your body and keep you healthy. The food you should eat also will depend on several factors, including:  The calories you need.  The medicines you take.  Your weight.  Your blood glucose level.  Your blood pressure level.  Your cholesterol level. You also should eat a variety of foods, including:  Protein, such as meat, poultry, fish, tofu, nuts, and seeds (lean animal proteins are best).  Fruits.  Vegetables.  Dairy products, such as milk, cheese, and yogurt (low fat is best).  Breads, grains, pasta, cereal, rice, and beans.  Fats such as olive oil, trans fat-free margarine, canola oil, avocado, and olives. DOES EVERYONE WITH DIABETES MELLITUS HAVE THE SAME MEAL PLAN? Because every person with diabetes mellitus is different, there is not one meal plan that works for everyone. It is very important that you meet with a dietitian who will help you create a meal plan that is just right for you. Document Released: 03/19/2005 Document Revised: 06/27/2013 Document Reviewed: 05/19/2013 Exit[employer] Patient Information 2015 [employer]. This   information is not intended to replace advice given to you by your health care provider. Make sure you discuss any questions you have with your health care provider.  

## 2014-12-18 NOTE — Progress Notes (Signed)
Quick Note:  Labs addressed at office as well as medications and patient was made aware. ______ 
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
## 2014-12-27 ENCOUNTER — Telehealth: Payer: Self-pay | Admitting: *Deleted

## 2014-12-27 NOTE — Telephone Encounter (Signed)
Called [facility] to schedule patient's thyroid biopsy and was told I needed to call [facility] in order to schedule.  Nurse called [facility] and was told to leave a message for Olin Hauser, the biopsy scheduler.  Nurse was told that Olin Hauser would call the patient and schedule the appointment.  Left patient's medical record number and birth date on Pamela's voicemail along with my name and phone number at the clinic if she needed to contact me back.

## 2014-12-28 ENCOUNTER — Other Ambulatory Visit: Payer: Self-pay | Admitting: *Deleted

## 2014-12-28 ENCOUNTER — Telehealth: Payer: Self-pay | Admitting: *Deleted

## 2014-12-28 DIAGNOSIS — E1122 Type 2 diabetes mellitus with diabetic chronic kidney disease: Secondary | ICD-10-CM

## 2014-12-28 DIAGNOSIS — N182 Chronic kidney disease, stage 2 (mild): Principal | ICD-10-CM

## 2014-12-28 MED ORDER — INSULIN PEN NEEDLE 29G X 12.7MM MISC: 0.8000 mL | Freq: Two times a day (BID) | Status: DC

## 2014-12-28 NOTE — Telephone Encounter (Signed)
Made appointment for patient's ultrasound and called patient to let her know when it was.  She would like to go on another day and I gave her the scheduling number [phone] and told her she could call and reschedule.  Patient agreed to call and go to the appointment.

## 2014-12-31 ENCOUNTER — Other Ambulatory Visit: Payer: Self-pay | Admitting: *Deleted

## 2014-12-31 ENCOUNTER — Telehealth: Payer: Self-pay | Admitting: Family Medicine

## 2014-12-31 DIAGNOSIS — J45901 Unspecified asthma with (acute) exacerbation: Secondary | ICD-10-CM

## 2014-12-31 DIAGNOSIS — E1122 Type 2 diabetes mellitus with diabetic chronic kidney disease: Secondary | ICD-10-CM

## 2014-12-31 DIAGNOSIS — N182 Chronic kidney disease, stage 2 (mild): Principal | ICD-10-CM

## 2014-12-31 MED ORDER — INSULIN PEN NEEDLE 29G X 12.7MM MISC: Status: DC

## 2014-12-31 MED ORDER — GLUCOSE BLOOD VI STRP: ORAL_STRIP | Status: DC

## 2014-12-31 MED ORDER — ALBUTEROL SULFATE HFA 108 (90 BASE) MCG/ACT IN AERS: 2.0000 | INHALATION_SPRAY | Freq: Four times a day (QID) | RESPIRATORY_TRACT | Status: DC | PRN

## 2014-12-31 NOTE — Telephone Encounter (Addendum)
Pt calling for correction on prescription for needles.  Pt states the prescription that was called in recently was for a previous script that is no longer covered by [employer]. Please f/u with pt for details regarding corrected script, pt shops at [facility] on [address].

## 2014-12-31 NOTE — Telephone Encounter (Signed)
Dr. Jarold Song gave ok to refill pen needle prescription and have patient test CBG tid. Printing prescription and faxing to [facility] at [address].

## 2015-01-02 DIAGNOSIS — M5137 Other intervertebral disc degeneration, lumbosacral region: Secondary | ICD-10-CM | POA: Diagnosis not present

## 2015-01-02 DIAGNOSIS — G8929 Other chronic pain: Secondary | ICD-10-CM | POA: Diagnosis not present

## 2015-01-02 DIAGNOSIS — Z79899 Other long term (current) drug therapy: Secondary | ICD-10-CM | POA: Diagnosis not present

## 2015-01-04 ENCOUNTER — Ambulatory Visit (HOSPITAL_COMMUNITY): Payer: Medicare Other

## 2015-01-04 ENCOUNTER — Ambulatory Visit (HOSPITAL_COMMUNITY)
Admission: RE | Admit: 2015-01-04 | Discharge: 2015-01-04 | Disposition: A | Discharge status: Home / Self Care | Payer: Medicare Other | Source: Ambulatory Visit | Attending: Family Medicine | Admitting: Family Medicine

## 2015-01-04 DIAGNOSIS — E041 Nontoxic single thyroid nodule: Secondary | ICD-10-CM | POA: Diagnosis present

## 2015-01-04 DIAGNOSIS — E042 Nontoxic multinodular goiter: Secondary | ICD-10-CM | POA: Insufficient documentation

## 2015-01-08 ENCOUNTER — Telehealth: Payer: Self-pay | Admitting: *Deleted

## 2015-01-08 NOTE — Telephone Encounter (Signed)
Patient called returning nurse's call, please f/u °

## 2015-01-08 NOTE — Telephone Encounter (Signed)
-----   Message from Arnoldo Morale, MD sent at 01/04/2015  1:55 PM EDT ----- Please inform her that her thyroid shows some nodules which do not meet the size criteria for biopsy; will need a repeat in 12 months.

## 2015-01-08 NOTE — Telephone Encounter (Signed)
Left HIPAA compliant message for patient to return my call.

## 2015-01-10 ENCOUNTER — Ambulatory Visit (HOSPITAL_COMMUNITY): Payer: Medicare Other

## 2015-01-14 ENCOUNTER — Encounter: Payer: Self-pay | Admitting: Family Medicine

## 2015-01-14 ENCOUNTER — Ambulatory Visit: Payer: Medicare Other | Attending: Family Medicine | Admitting: Family Medicine

## 2015-01-14 VITALS — BP 106/71 | HR 68 | Temp 97.6°F | Resp 18 | Ht 63.0 in | Wt 253.6 lb

## 2015-01-14 DIAGNOSIS — Z794 Long term (current) use of insulin: Secondary | ICD-10-CM | POA: Insufficient documentation

## 2015-01-14 DIAGNOSIS — J4541 Moderate persistent asthma with (acute) exacerbation: Secondary | ICD-10-CM | POA: Diagnosis not present

## 2015-01-14 DIAGNOSIS — I5033 Acute on chronic diastolic (congestive) heart failure: Secondary | ICD-10-CM | POA: Diagnosis not present

## 2015-01-14 DIAGNOSIS — E1165 Type 2 diabetes mellitus with hyperglycemia: Secondary | ICD-10-CM | POA: Diagnosis not present

## 2015-01-14 DIAGNOSIS — G44229 Chronic tension-type headache, not intractable: Secondary | ICD-10-CM

## 2015-01-14 DIAGNOSIS — E041 Nontoxic single thyroid nodule: Secondary | ICD-10-CM

## 2015-01-14 LAB — GLUCOSE, POCT (MANUAL RESULT ENTRY): POC Glucose: 173 mg/dl — AB (ref 70–99)

## 2015-01-14 MED ORDER — DM-GUAIFENESIN ER 30-600 MG PO TB12: 1.0000 | ORAL_TABLET | Freq: Two times a day (BID) | ORAL | Status: DC

## 2015-01-14 MED ORDER — INSULIN ASPART PROT & ASPART (70-30 MIX) 100 UNIT/ML PEN: 80.0000 [IU] | PEN_INJECTOR | Freq: Three times a day (TID) | SUBCUTANEOUS | Status: DC

## 2015-01-14 MED ORDER — PNEUMOCOCCAL 13-VAL CONJ VACC IM SUSP
0.5000 mL | Freq: Once | INTRAMUSCULAR | Status: AC
  Administered 2015-01-14: 0.5 mL via INTRAMUSCULAR

## 2015-01-14 NOTE — Patient Instructions (Signed)
Diabetes Mellitus and Food It is important for you to manage your blood sugar (glucose) level. Your blood glucose level can be greatly affected by what you eat. Eating healthier foods in the appropriate amounts throughout the day at about the same time each day will help you control your blood glucose level. It can also help slow or prevent worsening of your diabetes mellitus. Healthy eating may even help you improve the level of your blood pressure and reach or maintain a healthy weight.  HOW CAN FOOD AFFECT ME? Carbohydrates Carbohydrates affect your blood glucose level more than any other type of food. Your dietitian will help you determine how many carbohydrates to eat at each meal and teach you how to count carbohydrates. Counting carbohydrates is important to keep your blood glucose at a healthy level, especially if you are using insulin or taking certain medicines for diabetes mellitus. Alcohol Alcohol can cause sudden decreases in blood glucose (hypoglycemia), especially if you use insulin or take certain medicines for diabetes mellitus. Hypoglycemia can be a life-threatening condition. Symptoms of hypoglycemia (sleepiness, dizziness, and disorientation) are similar to symptoms of having too much alcohol.  If your health care provider has given you approval to drink alcohol, do so in moderation and use the following guidelines:  Women should not have more than one drink per day, and men should not have more than two drinks per day. One drink is equal to:  12 oz of beer.  5 oz of wine.  1 oz of hard liquor.  Do not drink on an empty stomach.  Keep yourself hydrated. Have water, diet soda, or unsweetened iced tea.  Regular soda, juice, and other mixers might contain a lot of carbohydrates and should be counted. WHAT FOODS ARE NOT RECOMMENDED? As you make food choices, it is important to remember that all foods are not the same. Some foods have fewer nutrients per serving than other  foods, even though they might have the same number of calories or carbohydrates. It is difficult to get your body what it needs when you eat foods with fewer nutrients. Examples of foods that you should avoid that are high in calories and carbohydrates but low in nutrients include:  Trans fats (most processed foods list trans fats on the Nutrition Facts label).  Regular soda.  Juice.  Candy.  Sweets, such as cake, pie, doughnuts, and cookies.  Fried foods. WHAT FOODS CAN I EAT? Have nutrient-rich foods, which will nourish your body and keep you healthy. The food you should eat also will depend on several factors, including:  The calories you need.  The medicines you take.  Your weight.  Your blood glucose level.  Your blood pressure level.  Your cholesterol level. You also should eat a variety of foods, including:  Protein, such as meat, poultry, fish, tofu, nuts, and seeds (lean animal proteins are best).  Fruits.  Vegetables.  Dairy products, such as milk, cheese, and yogurt (low fat is best).  Breads, grains, pasta, cereal, rice, and beans.  Fats such as olive oil, trans fat-free margarine, canola oil, avocado, and olives. DOES EVERYONE WITH DIABETES MELLITUS HAVE THE SAME MEAL PLAN? Because every person with diabetes mellitus is different, there is not one meal plan that works for everyone. It is very important that you meet with a dietitian who will help you create a meal plan that is just right for you. Document Released: 03/19/2005 Document Revised: 06/27/2013 Document Reviewed: 05/19/2013 Exit[employer] Patient Information 2015 [employer]. This   information is not intended to replace advice given to you by your health care provider. Make sure you discuss any questions you have with your health care provider.  
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
## 2015-01-14 NOTE — Progress Notes (Signed)
Subjective:    Patient ID: Heidi [name], female    DOB: Feb 12, 1951, 64 y.o.   MRN: [id number]  HPI  64 year old female with a history of Type 2 DM (A1c 11.7), Asthma,  Chronic renal failure,CHF, Obesity, recently hospitalized for respiratory failure and acute asthma exacerbation secondary to pneumonia who is here for follow-up of her thyroid ultrasound which revealed multiple thyroid nodules which do not meet the criteria for biopsy. She has no history of previous history of thyroid disease and her most recent TSH from 11/2014 was 0.627. She had previously brought in a copy of a thyroid ultrasound done by her PCP at [facility] which revealed hypoechoic solid nodule with calcification within the mid to inferior pole of the left thyroid lobe, FNA recommended  Complains of intermittent headaches for which she has been seeing a neurologist and remains on Topamax which does not help much; she is in the process of receiving Botox injections and will need to call to schedule an appointment. Denies blurry vision, nausea or vomiting. She has also run out of her NovoLog 70/30 and was informed by the pharmacy that she would not be due for a refill until 01/24/15. She is requesting a refill of Mucinex.  Remains on 2L oxygen for CHF and her dyspnea has not changed from her baseline.  Past Medical History  Diagnosis Date  . Asthma   . Hypertension   . Diabetes mellitus without complication     Past Surgical History  Procedure Laterality Date  . Abdominal hysterectomy      Family History  Problem Relation Age of Onset  . Colon cancer Father   . Diabetes type II Sister   . Diabetes type II Brother   . Colon cancer Sister     History   Social History  . Marital Status: Single    Spouse Name: N/A  . Number of Children: N/A  . Years of Education: N/A   Occupational History  . Not on file.   Social History Main Topics  . Smoking status: Never Smoker   . Smokeless tobacco: Not on file  .  Alcohol Use: No  . Drug Use: No  . Sexual Activity: Not on file   Other Topics Concern  . Not on file   Social History Narrative    No Known Allergies  Current Outpatient Prescriptions on File Prior to Visit  Medication Sig Dispense Refill  . albuterol (PROVENTIL HFA;VENTOLIN HFA) 108 (90 BASE) MCG/ACT inhaler Inhale 2 puffs into the lungs every 6 (six) hours as needed for wheezing or shortness of breath. 1 Inhaler 3  . albuterol (PROVENTIL) (2.5 MG/3ML) 0.083% nebulizer solution Take 3 mLs (2.5 mg total) by nebulization every 6 (six) hours as needed for wheezing or shortness of breath. 150 mL 1  . aspirin 81 MG chewable tablet 81 mg daily.    Marland Kitchen atenolol (TENORMIN) 50 MG tablet Take 1 tablet (50 mg total) by mouth 2 (two) times daily. 60 tablet 3  . atorvastatin (LIPITOR) 40 MG tablet Take 1 tablet (40 mg total) by mouth daily at 6 PM. 30 tablet 2  . bumetanide (BUMEX) 0.5 MG tablet Take 1 tablet (0.5 mg total) by mouth daily. 30 tablet 2  . calcium-vitamin D (OSCAL WITH D) 500-200 MG-UNIT per tablet Take 1 tablet by mouth daily. 30 tablet 2  . Fluticasone-Salmeterol (ADVAIR) 250-50 MCG/DOSE AEPB Inhale 1 puff into the lungs 2 (two) times daily.    Marland Kitchen glucose blood (FREESTYLE TEST  STRIPS) test strip Use as instructed 100 each 12  . guaifenesin (ROBITUSSIN) 100 MG/5ML syrup Take 200 mg by mouth 3 (three) times daily as needed for cough.    . hydrochlorothiazide (HYDRODIURIL) 25 MG tablet Take 25 mg by mouth daily.  1  . HYDROcodone-acetaminophen (NORCO) 7.5-325 MG per tablet Take 1 tablet by mouth every 6 (six) hours as needed for moderate pain or severe pain.   0  . Insulin Pen Needle (BD ULTRA-FINE PEN NEEDLES) 29G X 12.7MM MISC Check blood sugar three times per day before meals 60 each 3  . Lancets (FREESTYLE) lancets Use as instructed 100 each 12  . lisinopril (PRINIVIL,ZESTRIL) 5 MG tablet Take 1 tablet (5 mg total) by mouth daily. 30 tablet 0  . loratadine (CLARITIN) 10 MG tablet  Take 10 mg by mouth daily.  1  . montelukast (SINGULAIR) 10 MG tablet Take 1 tablet (10 mg total) by mouth daily. 30 tablet 1  . nitroGLYCERIN (NITROSTAT) 0.3 MG SL tablet Place 0.3 mg under the tongue every 5 (five) minutes as needed for chest pain.     Marland Kitchen omega-3 acid ethyl esters (LOVAZA) 1 G capsule Take 1,000 mg by mouth 2 (two) times daily.    Marland Kitchen omeprazole (PRILOSEC) 40 MG capsule Take 1 capsule (40 mg total) by mouth daily. 30 capsule 1  . potassium chloride (K-DUR) 10 MEQ tablet Take 1 tablet (10 mEq total) by mouth daily. 30 tablet 2  . topiramate (TOPAMAX) 50 MG tablet Take 50 mg by mouth 2 (two) times daily.  2  . benzonatate (TESSALON) 100 MG capsule Take 100 mg by mouth daily as needed for cough.      No current facility-administered medications on file prior to visit.    Review of Systems  Constitutional: Negative for activity change, appetite change and fatigue.  HENT: Negative for congestion, sinus pressure and sore throat.   Eyes: Negative for visual disturbance.  Respiratory: Positive for cough and shortness of breath. Negative for chest tightness and wheezing.   Cardiovascular: Positive for leg swelling. Negative for chest pain and palpitations.  Gastrointestinal: Negative for abdominal pain, constipation and abdominal distention.  Endocrine: Negative for polydipsia.  Genitourinary: Negative for dysuria and frequency.  Musculoskeletal: Negative for back pain and arthralgias.  Skin: Negative for rash.  Neurological: Positive for headaches. Negative for tremors, light-headedness and numbness.  Hematological: Does not bruise/bleed easily.  Psychiatric/Behavioral: Negative for behavioral problems and agitation.         Objective: Filed Vitals:   01/14/15 1504  BP: 106/71  Pulse: 68  Temp: 97.6 F (36.4 C)  TempSrc: Oral  Resp: 18  Height: 5\' 3"  (1.6 m)  Weight: 253 lb 9.6 oz (115.032 kg)  SpO2: 93%      Physical Exam  Constitutional: She is oriented to  person, place, and time. She appears well-developed and well-nourished. No distress.  Appears comfortable on 2L oxygen via nasal cannula  Eyes: Conjunctivae and EOM are normal. Pupils are equal, round, and reactive to light.  Neck: Normal range of motion. No JVD present.  Cardiovascular: Normal rate, regular rhythm, normal heart sounds and intact distal pulses.  Exam reveals no gallop.   No murmur heard. Pulmonary/Chest: Effort normal and breath sounds normal. No respiratory distress. She has no wheezes. She has no rales. She exhibits no tenderness.  Abdominal: Soft. Bowel sounds are normal. She exhibits no distension and no mass. There is no tenderness.  Musculoskeletal: Normal range of motion. She exhibits edema (  non pitting edema up to the ankles bilaterally). She exhibits no tenderness.  Neurological: She is alert and oriented to person, place, and time. She has normal reflexes.  Skin: Skin is warm and dry. She is not diaphoretic.  Psychiatric: She has a normal mood and affect.     CMP Latest Ref Rng 12/11/2014 12/02/2014 12/01/2014  Glucose 70 - 99 mg/dL 195(H) 164(H) 85  BUN 6 - 23 mg/dL 16 19 16   Creatinine 0.50 - 1.10 mg/dL 1.25(H) 1.17(H) 1.23(H)  Sodium 135 - 145 mEq/L 140 138 139  Potassium 3.5 - 5.3 mEq/L 4.1 4.1 3.7  Chloride 96 - 112 mEq/L 101 103 104  CO2 19 - 32 mEq/L 30 28 26   Calcium 8.4 - 10.5 mg/dL 9.7 9.7 9.2  Total Protein 6.5 - 8.1 g/dL - - -  Total Bilirubin 0.3 - 1.2 mg/dL - - -  Alkaline Phos 38 - 126 U/L - - -  AST 15 - 41 U/L - - -  ALT 14 - 54 U/L - - -           Assessment & Plan:  64 year old female with a history of Type 2 DM, Asthma,  Chronic renal failure,CHF, Obesity  Recently managed for respiratory failure secondary to acute asthma exacerbation now doing well and is here for a follow up for thyroid ultrasound.  Type 2 diabetes mellitus: Increase NovoLog 70/30 to 80 units 3 times daily, hopefully she will be able to pick this up from the pharmacy  today.  Asthma exacerbation: Resolved.  Congestive heart failure: No evidence of fluid overload at this time. Advised to limit fluids to 2 L per day, and check daily weights.  Acute on chronic renal failure: Renal function is stable. Advised to avoid nephrotoxins.  Thyroid nodules: Will need a follow-up in one year as well as thyroid panel as nodules do not meet criteria for biopsy at this time.  Headache: Patient to continue Topamax Advised to keep appointment with Neurology.  This note has been created with Surveyor, quantity. Any transcriptional errors are unintentional.

## 2015-01-15 ENCOUNTER — Ambulatory Visit: Payer: Medicare Other | Attending: Family Medicine

## 2015-01-15 DIAGNOSIS — E1165 Type 2 diabetes mellitus with hyperglycemia: Secondary | ICD-10-CM | POA: Diagnosis not present

## 2015-01-15 DIAGNOSIS — E119 Type 2 diabetes mellitus without complications: Secondary | ICD-10-CM | POA: Diagnosis not present

## 2015-01-15 LAB — LIPID PANEL
CHOL/HDL RATIO: 2.9 ratio
Cholesterol: 109 mg/dL (ref 0–200)
HDL: 38 mg/dL — ABNORMAL LOW (ref >=46)
LDL CALC: 49 mg/dL (ref 0–99)
TRIGLYCERIDES: 112 mg/dL (ref <150)
VLDL: 22 mg/dL (ref 0–40)

## 2015-01-15 NOTE — Progress Notes (Signed)
Quick Note:  Labs addressed at office as well as medications and patient was made aware. ______ 

## 2015-01-16 ENCOUNTER — Telehealth: Payer: Self-pay | Admitting: *Deleted

## 2015-01-16 NOTE — Telephone Encounter (Signed)
-----   Message from Arnoldo Morale, MD sent at 01/16/2015  8:15 AM EDT ----- Please inform the patient that labs are normal. Thank you.

## 2015-01-16 NOTE — Telephone Encounter (Signed)
Verified name and date of birth and gave normal lab results.  Patient verified understanding.

## 2015-01-24 DIAGNOSIS — G2589 Other specified extrapyramidal and movement disorders: Secondary | ICD-10-CM | POA: Diagnosis not present

## 2015-01-24 DIAGNOSIS — M542 Cervicalgia: Secondary | ICD-10-CM | POA: Diagnosis not present

## 2015-01-31 ENCOUNTER — Other Ambulatory Visit (HOSPITAL_COMMUNITY)
Admission: RE | Admit: 2015-01-31 | Discharge: 2015-01-31 | Disposition: A | Discharge status: Home / Self Care | Payer: Medicare Other | Source: Ambulatory Visit | Attending: Interventional Radiology | Admitting: Interventional Radiology

## 2015-01-31 ENCOUNTER — Ambulatory Visit
Admission: RE | Admit: 2015-01-31 | Discharge: 2015-01-31 | Disposition: A | Discharge status: Home / Self Care | Payer: Medicare Other | Source: Ambulatory Visit | Attending: Family Medicine | Admitting: Family Medicine

## 2015-01-31 DIAGNOSIS — E041 Nontoxic single thyroid nodule: Secondary | ICD-10-CM | POA: Diagnosis not present

## 2015-02-01 ENCOUNTER — Other Ambulatory Visit: Payer: Self-pay | Admitting: Family Medicine

## 2015-02-01 DIAGNOSIS — N182 Chronic kidney disease, stage 2 (mild): Principal | ICD-10-CM

## 2015-02-01 DIAGNOSIS — E1122 Type 2 diabetes mellitus with diabetic chronic kidney disease: Secondary | ICD-10-CM

## 2015-02-01 MED ORDER — INSULIN PEN NEEDLE 29G X 12.7MM MISC: Status: DC

## 2015-02-02 ENCOUNTER — Other Ambulatory Visit: Payer: Self-pay | Admitting: Family Medicine

## 2015-02-04 NOTE — Telephone Encounter (Signed)
Please call this number for prior authorization [id number] for her Novolog prescription.Marland KitchenMarland Kitchen

## 2015-02-06 ENCOUNTER — Other Ambulatory Visit: Payer: Self-pay | Admitting: Family Medicine

## 2015-02-06 ENCOUNTER — Telehealth: Payer: Self-pay | Admitting: General Practice

## 2015-02-06 NOTE — Telephone Encounter (Signed)
Patient called to request a med refill for lisinopril (PRINIVIL,ZESTRIL) 5 MG tablet. Please f/u with pt.

## 2015-02-07 ENCOUNTER — Other Ambulatory Visit: Payer: Self-pay | Admitting: Family Medicine

## 2015-02-07 MED ORDER — LISINOPRIL 5 MG PO TABS: 5.0000 mg | ORAL_TABLET | Freq: Every day | ORAL | Status: DC

## 2015-02-07 NOTE — Telephone Encounter (Signed)
Patient called to request a med refill on her blood pressure medication. Please f/u with pt.

## 2015-02-11 ENCOUNTER — Ambulatory Visit: Payer: Medicare Other | Attending: Family Medicine | Admitting: Family Medicine

## 2015-02-11 ENCOUNTER — Encounter: Payer: Self-pay | Admitting: Family Medicine

## 2015-02-11 VITALS — BP 130/78 | HR 68 | Temp 97.7°F | Resp 16 | Ht 63.0 in | Wt 245.0 lb

## 2015-02-11 DIAGNOSIS — N182 Chronic kidney disease, stage 2 (mild): Secondary | ICD-10-CM

## 2015-02-11 DIAGNOSIS — E1165 Type 2 diabetes mellitus with hyperglycemia: Secondary | ICD-10-CM | POA: Diagnosis not present

## 2015-02-11 DIAGNOSIS — M25562 Pain in left knee: Secondary | ICD-10-CM

## 2015-02-11 DIAGNOSIS — F09 Unspecified mental disorder due to known physiological condition: Secondary | ICD-10-CM

## 2015-02-11 DIAGNOSIS — S0990XS Unspecified injury of head, sequela: Secondary | ICD-10-CM

## 2015-02-11 DIAGNOSIS — I5033 Acute on chronic diastolic (congestive) heart failure: Secondary | ICD-10-CM | POA: Diagnosis not present

## 2015-02-11 DIAGNOSIS — M25512 Pain in left shoulder: Secondary | ICD-10-CM

## 2015-02-11 DIAGNOSIS — E1122 Type 2 diabetes mellitus with diabetic chronic kidney disease: Secondary | ICD-10-CM | POA: Diagnosis not present

## 2015-02-11 DIAGNOSIS — IMO0002 Reserved for concepts with insufficient information to code with codable children: Secondary | ICD-10-CM

## 2015-02-11 DIAGNOSIS — I10 Essential (primary) hypertension: Secondary | ICD-10-CM

## 2015-02-11 DIAGNOSIS — E041 Nontoxic single thyroid nodule: Secondary | ICD-10-CM

## 2015-02-11 DIAGNOSIS — J45909 Unspecified asthma, uncomplicated: Secondary | ICD-10-CM

## 2015-02-11 DIAGNOSIS — G8929 Other chronic pain: Secondary | ICD-10-CM

## 2015-02-11 DIAGNOSIS — E1129 Type 2 diabetes mellitus with other diabetic kidney complication: Secondary | ICD-10-CM | POA: Diagnosis not present

## 2015-02-11 DIAGNOSIS — M25561 Pain in right knee: Secondary | ICD-10-CM

## 2015-02-11 DIAGNOSIS — Z0000 Encounter for general adult medical examination without abnormal findings: Secondary | ICD-10-CM

## 2015-02-11 LAB — POCT URINALYSIS DIPSTICK
BILIRUBIN UA: NEGATIVE
Blood, UA: NEGATIVE
GLUCOSE UA: NEGATIVE
Ketones, UA: NEGATIVE
Nitrite, UA: NEGATIVE
Protein, UA: NEGATIVE
Spec Grav, UA: 1.02
UROBILINOGEN UA: 0.2
pH, UA: 5

## 2015-02-11 LAB — GLUCOSE, POCT (MANUAL RESULT ENTRY)
POC Glucose: 333 mg/dl — AB (ref 70–99)
POC Glucose: 285 mg/dl — AB (ref 70–99)

## 2015-02-11 LAB — POCT GLYCOSYLATED HEMOGLOBIN (HGB A1C): Hemoglobin A1C: 10.9

## 2015-02-11 MED ORDER — BUMETANIDE 0.5 MG PO TABS: 0.5000 mg | ORAL_TABLET | Freq: Every day | ORAL | Status: DC

## 2015-02-11 MED ORDER — "INSULIN SYRINGE 28G X 1/2"" 1 ML MISC": 1.0000 | Freq: Three times a day (TID) | Status: DC

## 2015-02-11 MED ORDER — INSULIN ASPART 100 UNIT/ML ~~LOC~~ SOLN
10.0000 [IU] | Freq: Once | SUBCUTANEOUS | Status: AC
  Administered 2015-02-11: 10 [IU] via SUBCUTANEOUS

## 2015-02-11 MED ORDER — INSULIN ASPART PROT & ASPART (70-30 MIX) 100 UNIT/ML ~~LOC~~ SUSP: 75.0000 [IU] | Freq: Three times a day (TID) | SUBCUTANEOUS | Status: DC

## 2015-02-11 MED ORDER — ATENOLOL 50 MG PO TABS: 50.0000 mg | ORAL_TABLET | Freq: Two times a day (BID) | ORAL | Status: DC

## 2015-02-11 MED ORDER — HYDROCHLOROTHIAZIDE 25 MG PO TABS: 25.0000 mg | ORAL_TABLET | Freq: Every day | ORAL | Status: DC

## 2015-02-11 NOTE — Progress Notes (Signed)
   Subjective:    Patient ID: Heidi [name], female    DOB: [birth year], 64 y.o.   MRN: [id number] CC: f/u diabetes  HPI  1 . CHRONIC DIABETES  Disease Monitoring  Blood Sugar Ranges: 100s  Polyuria: no   Visual problems: no   Medication Compliance: yes would like vials to avoid given 2 injections per dose  Medication Side Effects  Hypoglycemia: no   2. HTN: requesting med refills. Compliant with meds. No CP or SOB.  3. Neck pain: chronic neck and shoulder pain since fall in 12/2013. Has pain that radiates down L arm. No recent injury. Requesting x-ray. Patient reports confusion since fall.   4. Knee pain: chronic both knees. Interested in steroid injection. No recent x-rays. No recent trauma.   5. Thyroid nodule: s/p [place] and biopsy. Normal follicular cells. Normal TSH. Patient would like surgery to remove nodule. No significant pain. No trouble swallowing.   History  Substance Use Topics  . Smoking status: Never Smoker   . Smokeless tobacco: Not on file  . Alcohol Use: No   Review of Systems  Constitutional: Negative for fever and chills.  Eyes: Negative for visual disturbance.  Respiratory: Negative for shortness of breath.   Cardiovascular: Negative for chest pain.  Gastrointestinal: Negative for abdominal pain and blood in stool.  Musculoskeletal: Positive for myalgias, arthralgias and neck pain. Negative for back pain.  Skin: Negative for rash.  Allergic/Immunologic: Negative for immunocompromised state.  Hematological: Negative for adenopathy. Does not bruise/bleed easily.  Psychiatric/Behavioral: Positive for confusion. Negative for suicidal ideas and dysphoric mood.      Objective:   Physical Exam  Constitutional: She is oriented to person, place, and time. She appears well-developed and well-nourished. No distress.  HENT:  Head: Normocephalic and atraumatic.  Cardiovascular: Normal rate, regular rhythm, normal heart sounds and intact distal pulses.     Pulmonary/Chest: Effort normal and breath sounds normal.  Musculoskeletal: She exhibits no edema.  Neurological: She is alert and oriented to person, place, and time.  Skin: Skin is warm and dry. No rash noted.  Psychiatric: She has a normal mood and affect.  BP 130/78 mmHg  Pulse 68  Temp(Src) 97.7 F (36.5 C) (Oral)  Resp 16  Ht 5\' 3"  (1.6 m)  Wt 245 lb (111.131 kg)  BMI 43.41 kg/m2  SpO2 95%   Lab Results  Component Value Date   HGBA1C 10.90 02/11/2015   CBG 333 UA: neg ketones  novolog 10 U given  Repeat CBG 285      Assessment & Plan:

## 2015-02-11 NOTE — Patient Instructions (Signed)
Ms. Ramnath,  1. Diabetes: Refilled 70/30  Diabetes blood sugar goals  Fasting (in AM before breakfast, 8 hrs of no eating or drinking (except water or unsweetened coffee or tea): 90-110 2 hrs after meals: < 160,   No low sugars: nothing < 70   2. Thyroid nodule with normal biopsy: Surgery is not recommended  3. L shoulder pain: X-rays ordered   4. Knee pain: X-rays ordered   F/u in 3-4 weeks for knee injection after you have your x-ray  Dr. Adrian Blackwater

## 2015-02-11 NOTE — Progress Notes (Signed)
Establish Care with PCP F/U DM, Pt refused to check A!C Glucose running 198-260 No hx tobacco

## 2015-02-12 ENCOUNTER — Telehealth: Payer: Self-pay | Admitting: *Deleted

## 2015-02-12 DIAGNOSIS — I10 Essential (primary) hypertension: Secondary | ICD-10-CM | POA: Insufficient documentation

## 2015-02-12 DIAGNOSIS — F09 Unspecified mental disorder due to known physiological condition: Secondary | ICD-10-CM | POA: Insufficient documentation

## 2015-02-12 DIAGNOSIS — J45909 Unspecified asthma, uncomplicated: Secondary | ICD-10-CM | POA: Insufficient documentation

## 2015-02-12 DIAGNOSIS — S0990XS Unspecified injury of head, sequela: Secondary | ICD-10-CM

## 2015-02-12 LAB — MICROALBUMIN, URINE: Microalb, Ur: 0.3 mg/dL (ref <2.0)

## 2015-02-12 NOTE — Assessment & Plan Note (Signed)
A: HTN in setting of obesity and diabetes Med: compliant P: Refilled meds

## 2015-02-12 NOTE — Assessment & Plan Note (Signed)
L shoulder pain: since fall > 1 yr ago X-rays ordered

## 2015-02-12 NOTE — Telephone Encounter (Signed)
LVM to return call.

## 2015-02-12 NOTE — Assessment & Plan Note (Signed)
A: chronic knee pain P: X-rays ordered to evaluate for OA Plan for corticosteroid injections

## 2015-02-12 NOTE — Assessment & Plan Note (Addendum)
Large L thyroid lobe nodule on [place]. Normal pathology on biopsy  Reassurance Discouraged surgical removal of benign nodule

## 2015-02-12 NOTE — Assessment & Plan Note (Signed)
1. Diabetes: Refilled 70/30  Diabetes blood sugar goals  Fasting (in AM before breakfast, 8 hrs of no eating or drinking (except water or unsweetened coffee or tea): 90-110 2 hrs after meals: < 160,   No low sugars: nothing < 70

## 2015-02-12 NOTE — Telephone Encounter (Signed)
-----   Message from Boykin Nearing, MD sent at 02/12/2015  8:56 AM EDT ----- Normal urine microalbumin

## 2015-02-13 NOTE — Telephone Encounter (Signed)
Patient returned phone call, please f/u °

## 2015-02-14 NOTE — Telephone Encounter (Signed)
LVM x2 to return call

## 2015-02-14 NOTE — Telephone Encounter (Signed)
Patient returned phone call in regards to her results, please f/u with pt.

## 2015-02-16 ENCOUNTER — Other Ambulatory Visit: Payer: Self-pay | Admitting: Family Medicine

## 2015-02-18 ENCOUNTER — Telehealth: Payer: Self-pay | Admitting: Family Medicine

## 2015-02-18 ENCOUNTER — Ambulatory Visit (HOSPITAL_COMMUNITY)
Admission: RE | Admit: 2015-02-18 | Discharge: 2015-02-18 | Disposition: A | Discharge status: Home / Self Care | Payer: Medicare Other | Source: Ambulatory Visit | Attending: Family Medicine | Admitting: Family Medicine

## 2015-02-18 ENCOUNTER — Telehealth: Payer: Self-pay

## 2015-02-18 DIAGNOSIS — M25512 Pain in left shoulder: Secondary | ICD-10-CM | POA: Insufficient documentation

## 2015-02-18 DIAGNOSIS — G8929 Other chronic pain: Secondary | ICD-10-CM | POA: Diagnosis not present

## 2015-02-18 DIAGNOSIS — M25569 Pain in unspecified knee: Secondary | ICD-10-CM | POA: Diagnosis present

## 2015-02-18 DIAGNOSIS — M170 Bilateral primary osteoarthritis of knee: Secondary | ICD-10-CM | POA: Diagnosis not present

## 2015-02-18 DIAGNOSIS — M25561 Pain in right knee: Secondary | ICD-10-CM | POA: Insufficient documentation

## 2015-02-18 DIAGNOSIS — M25562 Pain in left knee: Secondary | ICD-10-CM | POA: Diagnosis not present

## 2015-02-18 MED ORDER — INSULIN LISPRO PROT & LISPRO (75-25 MIX) 100 UNIT/ML ~~LOC~~ SUSP: 75.0000 [IU] | Freq: Three times a day (TID) | SUBCUTANEOUS | Status: DC

## 2015-02-18 NOTE — Telephone Encounter (Signed)
Insurance company requesting prior authorization for [employer]. Nurse called number provided and printed off Prior Authorization form as requested in telephone call. Form has been partially completed by nurse and has been sent to Dr. Adrian Blackwater to complete form.

## 2015-02-18 NOTE — Telephone Encounter (Signed)
Opened in error

## 2015-02-18 NOTE — Addendum Note (Signed)
Addended by: Boykin Nearing on: 02/18/2015 06:40 PM   Modules accepted: Orders, Medications

## 2015-02-18 NOTE — Telephone Encounter (Signed)
Attempted to call patient to discuss insulin options which include changing to humalog 75/25 mix or applying for prior authorization for novolog 70/30  Prefer to change to humalog to ensure uninterrupted treatment long term  Phone # in chart was not in service  Plan to transition to humalog 75/25 at same doses of novolog, 75 U TID,  this change will be discussed with patient at f/u   PA form discarded.

## 2015-02-21 ENCOUNTER — Telehealth: Payer: Self-pay | Admitting: Family Medicine

## 2015-02-21 MED ORDER — OMEPRAZOLE 40 MG PO CPDR: 40.0000 mg | DELAYED_RELEASE_CAPSULE | Freq: Every day | ORAL | Status: DC

## 2015-02-21 NOTE — Telephone Encounter (Signed)
-----   Message from Boykin Nearing, MD sent at 02/18/2015  6:22 PM EDT ----- Normal L shoulder x-ray

## 2015-02-21 NOTE — Telephone Encounter (Signed)
-----   Message from Boykin Nearing, MD sent at 02/18/2015  6:22 PM EDT ----- Mild DJD in knees  Patient can return for knee corticosteroid injections, one at a time as needed for pain

## 2015-02-21 NOTE — Telephone Encounter (Signed)
LVM to return call   Medication refilled send Ovid

## 2015-02-21 NOTE — Telephone Encounter (Signed)
Patient called requesting medication refill for omeprazole (PRILOSEC) 40 MG capsule , patient states he she is completely out of medication . Please f/u

## 2015-02-27 ENCOUNTER — Ambulatory Visit: Payer: Medicare Other | Admitting: Physical Therapy

## 2015-03-05 ENCOUNTER — Other Ambulatory Visit: Payer: Self-pay | Admitting: Family Medicine

## 2015-03-06 DIAGNOSIS — G43709 Chronic migraine without aura, not intractable, without status migrainosus: Secondary | ICD-10-CM | POA: Diagnosis not present

## 2015-03-13 ENCOUNTER — Ambulatory Visit: Payer: Medicare Other | Attending: Family Medicine

## 2015-03-13 DIAGNOSIS — M25519 Pain in unspecified shoulder: Secondary | ICD-10-CM | POA: Insufficient documentation

## 2015-03-13 DIAGNOSIS — M25561 Pain in right knee: Secondary | ICD-10-CM

## 2015-03-13 DIAGNOSIS — M25669 Stiffness of unspecified knee, not elsewhere classified: Secondary | ICD-10-CM

## 2015-03-13 DIAGNOSIS — M25619 Stiffness of unspecified shoulder, not elsewhere classified: Secondary | ICD-10-CM | POA: Insufficient documentation

## 2015-03-13 DIAGNOSIS — R293 Abnormal posture: Secondary | ICD-10-CM | POA: Diagnosis not present

## 2015-03-13 DIAGNOSIS — M436 Torticollis: Secondary | ICD-10-CM | POA: Diagnosis not present

## 2015-03-13 DIAGNOSIS — M542 Cervicalgia: Secondary | ICD-10-CM | POA: Insufficient documentation

## 2015-03-13 DIAGNOSIS — R29898 Other symptoms and signs involving the musculoskeletal system: Secondary | ICD-10-CM | POA: Insufficient documentation

## 2015-03-13 DIAGNOSIS — M25562 Pain in left knee: Secondary | ICD-10-CM | POA: Diagnosis not present

## 2015-03-13 NOTE — Therapy (Signed)
[place], [place], [address] Phone: [phone]   Fax:  [phone]  Physical Therapy Evaluation  Patient Details  Name: Heidi [name] MRN: [id number] Date of Birth: [birth year] Referring Provider:  Boykin Nearing, MD  Encounter Date: 03/13/2015      PT End of Session - 03/13/15 1026    Visit Number 1   Number of Visits 19   Date for PT Re-Evaluation 05/09/15   Authorization Type Medicare KX at visit 15   Authorization - Visit Number 1   Authorization - Number of Visits 19   PT Start Time 0930   PT Stop Time 1015   PT Time Calculation (min) 45 min   Activity Tolerance Patient limited by pain   Behavior During Therapy Restless;WFL for tasks assessed/performed      Past Medical History  Diagnosis Date  . Asthma   . Hypertension   . Diabetes mellitus without complication     Past Surgical History  Procedure Laterality Date  . Abdominal hysterectomy      There were no vitals filed for this visit.  Visit Diagnosis:  Neck and shoulder pain - Plan: PT plan of care cert/re-cert  Stiffness of neck - Plan: PT plan of care cert/re-cert  Stiffness of shoulder joint, unspecified laterality - Plan: PT plan of care cert/re-cert  Arthralgia of both knees - Plan: PT plan of care cert/re-cert  Stiffness of knee joint, unspecified laterality - Plan: PT plan of care cert/re-cert  Abnormal posture - Plan: PT plan of care cert/re-cert  Weakness of both legs - Plan: PT plan of care cert/re-cert      Subjective Assessment - 03/13/15 0936    Subjective She reports fall 01/28/14.  She reports neck pain.  She reports she was having therapy for back from fall.   She last had PT 04/2014.   She has new order for neck and shoulder pain and knee pain. She reports shots in head 2 weeks ago for headaches. . She is npot doing her HEP from previous PT   Pertinent History Hospitalization in past year from fluid in  lungs.    Limitations Walking;House hold activities  sleeping, her daughter does home tasks   Patient Stated Goals Maily to have neck straight.    Currently in Pain? Yes   Pain Location Neck   Pain Orientation Left;Anterior   Pain Descriptors / Indicators Nagging;Cramping  unable to describe   Pain Type Chronic pain   Pain Onset More than a month ago   Pain Frequency Constant   Aggravating Factors  walking, activity, reaching,bending   Pain Relieving Factors medication, sit straight   Effect of Pain on Daily Activities limited in all activity   Multiple Pain Sites Yes   Pain Score 2   Pain Location Knee   Pain Orientation Right;Left;Anterior   Pain Descriptors / Indicators --  just hurts   Pain Type Chronic pain   Pain Onset More than a month ago   Pain Frequency Constant   Aggravating Factors  walking, activity   Pain Relieving Factors medication   Effect of Pain on Daily Activities all activity .imited            [facility] PT Assessment - 03/13/15 0931    Assessment   Medical Diagnosis LT shoulder pain , bilateral knee pain.   Onset Date/Surgical Date 01/28/14   Next MD Visit 03/26/15   Prior Therapy 04/2014 for back   Precautions   Precautions  None   Restrictions   Weight Bearing Restrictions No   Balance Screen   Has the patient fallen in the past 6 months No   Has the patient had a decrease in activity level because of a fear of falling?  Yes   Is the patient reluctant to leave their home because of a fear of falling?  Yes   Prior Function   Level of Independence Independent;Needs assistance with ADLs;Requires assistive device for independence   Cognition   Overall Cognitive Status Within Functional Limits for tasks assessed   Posture/Postural Control   Posture Comments roundedd shoulders forward head    ROM / Strength   AROM / PROM / Strength AROM;Strength   AROM   AROM Assessment Site Cervical;Knee;Shoulder   Right/Left Shoulder Right;Left   Right Shoulder  Flexion 105 Degrees   Right Shoulder ABduction 110 Degrees   Right Shoulder External Rotation 70 Degrees   Left Shoulder Flexion 105 Degrees   Left Shoulder ABduction 114 Degrees   Left Shoulder External Rotation 70 Degrees   Right/Left Knee Right;Left   Right Knee Extension 0   Right Knee Flexion 105   Left Knee Extension 0   Left Knee Flexion 105   Cervical Flexion 38   Cervical Extension 3   Cervical - Right Side Bend 30   Cervical - Left Side Bend 25   Cervical - Right Rotation 40   Cervical - Left Rotation 25   Strength   Strength Assessment Site Knee   Right/Left Knee Right;Left   Right Knee Flexion 4+/5   Right Knee Extension 4/5   Left Knee Flexion 4+/5   Left Knee Extension 4/5   Palpation   Palpation comment tender LT neck and shouder and anterior proximal to clavical with tightness noted   Ambulation/Gait   Gait velocity .8 feet /sec   Gait Comments She walks with SBQC decreeased step length                            PT Education - 03/13/15 1034    Education provided Yes   Education Details POC and need to have progress   Person(s) Educated Patient   Methods Explanation   Comprehension Verbalized understanding          PT Short Term Goals - 03/13/15 1042    PT SHORT TERM GOAL #1   Title She will be independent with inital HEP   Time 2   Period Weeks   Status New   PT SHORT TERM GOAL #2   Title She will improve active neck rotation to 45 degrees RT and LT    Time 3   Period Weeks   Status New   PT SHORT TERM GOAL #3   Title She will report neck pain decr 30% or more    Time 3   Period Weeks   Status New   PT SHORT TERM GOAL #4   Title She will demo understanding of good sitting psoture with support   Time 3   Period Weeks   Status New           PT Long Term Goals - 03/13/15 1044    PT LONG TERM GOAL #1   Title She will be independent with all HEP issued as of last visit.    Time 10   Period Weeks   Status New    PT LONG TERM GOAL #2   Title She will  report pain decr 50% or more with normal activity and use of LT arm   Time 10   Period Weeks   Status New   PT LONG TERM GOAL #3   Title She ill improve neck rotation to 55 degrees to improve mobility and dcr pain.    Time 10   Period Weeks   Status New   PT LONG TERM GOAL #4   Title Her gait velocity will improve to walking 2 feet per second.    Time 10   Period Weeks   Status New   PT LONG TERM GOAL #5   Title She will report knee pain improved 50% with walking normal activity in and out of home   Time 10   Period Weeks   Status New               Plan - 2015-03-18 1034    Clinical Impression Statement Ms Calk presents with multiple areas of chronic pain complaints. She relates this to a fall 01/2014 but repoorts limited activity prior to the fall. She was vague at times with her history. Due to duration of problems do not expect a full recovery but hopefully with PT she can make some functional gains.    Rehab Potential Good   PT Frequency 3x / week   PT Duration 3 weeks  then 2x/week for 5 weeks and assess porgress and possible extesnion if improving   PT Treatment/Interventions Electrical Stimulation;Moist Heat;Gait training;Functional mobility training;Therapeutic exercise;Manual techniques;Taping;Dry needling;Passive range of motion;Patient/family education;Balance training   PT Next Visit Plan Stretching neck and knee flexion , LE strength exercise, modalities, tape to shoulders. posture, manual   Consulted and Agree with Plan of Care Patient          G-Codes - 18-Mar-2015 1058    Functional Assessment Tool Used FOTO 64%   Functional Limitation Carrying, moving and handling objects   Carrying, Moving and Handling Objects Current Status [phone]) At least 60 percent but less than 80 percent impaired, limited or restricted   Carrying, Moving and Handling Objects Goal Status (V3710) At least 20 percent but less than 40 percent  impaired, limited or restricted       Problem List Patient Active Problem List   Diagnosis Date Noted  . HTN (hypertension) 02/12/2015  . Cognitive deficit due to old head trauma 02/12/2015  . Asthma, chronic 02/12/2015  . Health care maintenance 02/11/2015  . Chronic left shoulder pain 02/11/2015  . Knee pain, bilateral 02/11/2015  . Thyroid nodule 12/18/2014  . Neck pain   . HLD (hyperlipidemia)   . OSA treated with BiPAP 11/27/2014  . Obesity hypoventilation syndrome 11/27/2014  . Elevated troponin   . Demand ischemia   . Diabetes type 2, uncontrolled   . Cardiomegaly 11/25/2014  . CHF (congestive heart failure) 11/25/2014  . 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

## 2015-03-15 ENCOUNTER — Other Ambulatory Visit: Payer: Self-pay | Admitting: Family Medicine

## 2015-03-25 ENCOUNTER — Ambulatory Visit: Payer: Medicare Other

## 2015-03-25 DIAGNOSIS — M436 Torticollis: Secondary | ICD-10-CM | POA: Diagnosis not present

## 2015-03-25 DIAGNOSIS — M25619 Stiffness of unspecified shoulder, not elsewhere classified: Secondary | ICD-10-CM

## 2015-03-25 DIAGNOSIS — M25561 Pain in right knee: Secondary | ICD-10-CM | POA: Diagnosis not present

## 2015-03-25 DIAGNOSIS — M542 Cervicalgia: Principal | ICD-10-CM

## 2015-03-25 DIAGNOSIS — M25562 Pain in left knee: Secondary | ICD-10-CM

## 2015-03-25 DIAGNOSIS — M25519 Pain in unspecified shoulder: Secondary | ICD-10-CM | POA: Diagnosis not present

## 2015-03-25 DIAGNOSIS — R29898 Other symptoms and signs involving the musculoskeletal system: Secondary | ICD-10-CM

## 2015-03-25 NOTE — Therapy (Signed)
[place], [place], [address] Phone: [phone]   Fax:  [phone]  Physical Therapy Treatment  Patient Details  Name: Heidi [name] MRN: [id number] Date of Birth: 01/15/1951 Referring Galaxy Borden:  Boykin Nearing, MD  Encounter Date: 03/25/2015      PT End of Session - 03/25/15 1211    Visit Number 2   Number of Visits 19   Date for PT Re-Evaluation 05/09/15   PT Start Time 1140   PT Stop Time 1230   PT Time Calculation (min) 50 min   Activity Tolerance Patient tolerated treatment well   Behavior During Therapy [facility] for tasks assessed/performed      Past Medical History  Diagnosis Date  . Asthma   . Hypertension   . Diabetes mellitus without complication     Past Surgical History  Procedure Laterality Date  . Abdominal hysterectomy      There were no vitals filed for this visit.  Visit Diagnosis:  Neck and shoulder pain  Stiffness of neck  Stiffness of shoulder joint, unspecified laterality  Arthralgia of both knees  Weakness of both legs      Subjective Assessment - 03/25/15 1126    Subjective She reports feeling ok now.    Currently in Pain? Yes   Pain Score 1    Pain Orientation Left   Pain Descriptors / Indicators Nagging;Cramping   Pain Type Chronic pain   Pain Onset More than a month ago                         [facility] Adult PT Treatment/Exercise - 03/25/15 1138    Exercises   Exercises Knee/Hip;Neck   Knee/Hip Exercises: Seated   Long Arc Quad Strengthening;Right;Left;15 reps   Long Arc Quad Weight 3 lbs.   Marching Strengthening;Right;Left;15 reps   Marching Weights 3 lbs.   Hamstring Curl Strengthening;Right;Left;15 reps   Hamstring Limitations red band   Modalities   Modalities Moist Heat;Ultrasound   Moist Heat Therapy   Number Minutes Moist Heat 15 Minutes   Moist Heat Location Cervical   Ultrasound   Ultrasound Location neck   Ultrasound Parameters 100%.1MHZ, and 1.5 Wcm2   Ultrasound Goals Pain   Manual Therapy   Manual Therapy Joint mobilization;Soft tissue mobilization;Passive ROM   Soft tissue mobilization With Black rock blade LT posterior lateral and anterior LT neck                   PT Short Term Goals - 03/13/15 1042    PT SHORT TERM GOAL #1   Title She will be independent with inital HEP   Time 2   Period Weeks   Status New   PT SHORT TERM GOAL #2   Title She will improve active neck rotation to 45 degrees RT and LT    Time 3   Period Weeks   Status New   PT SHORT TERM GOAL #3   Title She will report neck pain decr 30% or more    Time 3   Period Weeks   Status New   PT SHORT TERM GOAL #4   Title She will demo understanding of good sitting psoture with support   Time 3   Period Weeks   Status New           PT Long Term Goals - 03/13/15 1044    PT LONG TERM GOAL #1   Title She will be  independent with all HEP issued as of last visit.    Time 10   Period Weeks   Status New   PT LONG TERM GOAL #2   Title She will report pain decr 50% or more with normal activity and use of LT arm   Time 10   Period Weeks   Status New   PT LONG TERM GOAL #3   Title She ill improve neck rotation to 55 degrees to improve mobility and dcr pain.    Time 10   Period Weeks   Status New   PT LONG TERM GOAL #4   Title Her gait velocity will improve to walking 2 feet per second.    Time 10   Period Weeks   Status New   PT LONG TERM GOAL #5   Title She will report knee pain improved 50% with walking normal activity in and out of home   Time 10   Period Weeks   Status New               Plan - 03/25/15 1212    Clinical Impression Statement She was tneder with STW but tolerated well . Initiated exercises for knee strength. Continue per plan   PT Next Visit Plan Stretching neck and knee flexion , LE strength exercise, modalities, tape to shoulders. posture, manual, issue HEP    Consulted and Agree with Plan of Care Patient        Problem List Patient Active Problem List   Diagnosis Date Noted  . HTN (hypertension) 02/12/2015  . Cognitive deficit due to old head trauma 02/12/2015  . Asthma, chronic 02/12/2015  . Health care maintenance 02/11/2015  . Chronic left shoulder pain 02/11/2015  . Knee pain, bilateral 02/11/2015  . Thyroid nodule 12/18/2014  . Neck pain   . HLD (hyperlipidemia)   . OSA treated with BiPAP 11/27/2014  . Obesity hypoventilation syndrome 11/27/2014  . Elevated troponin   . Demand ischemia   . Diabetes type 2, uncontrolled   . Cardiomegaly 11/25/2014  . CHF (congestive heart failure) 11/25/2014  . 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 [place], [place], [address] Phone: [phone]   Fax:  [phone]

## 2015-03-26 ENCOUNTER — Ambulatory Visit: Payer: Medicare Other | Attending: Family Medicine | Admitting: Family Medicine

## 2015-03-26 ENCOUNTER — Encounter: Payer: Self-pay | Admitting: Family Medicine

## 2015-03-26 VITALS — BP 112/67 | HR 73 | Temp 97.9°F | Resp 16 | Ht 63.0 in | Wt 245.0 lb

## 2015-03-26 DIAGNOSIS — Z794 Long term (current) use of insulin: Secondary | ICD-10-CM | POA: Diagnosis not present

## 2015-03-26 DIAGNOSIS — M25561 Pain in right knee: Secondary | ICD-10-CM | POA: Diagnosis not present

## 2015-03-26 DIAGNOSIS — K635 Polyp of colon: Secondary | ICD-10-CM | POA: Diagnosis not present

## 2015-03-26 DIAGNOSIS — M25562 Pain in left knee: Secondary | ICD-10-CM | POA: Insufficient documentation

## 2015-03-26 DIAGNOSIS — Z0000 Encounter for general adult medical examination without abnormal findings: Secondary | ICD-10-CM | POA: Diagnosis not present

## 2015-03-26 DIAGNOSIS — D126 Benign neoplasm of colon, unspecified: Secondary | ICD-10-CM | POA: Diagnosis not present

## 2015-03-26 DIAGNOSIS — Z7982 Long term (current) use of aspirin: Secondary | ICD-10-CM | POA: Diagnosis not present

## 2015-03-26 DIAGNOSIS — IMO0002 Reserved for concepts with insufficient information to code with codable children: Secondary | ICD-10-CM

## 2015-03-26 DIAGNOSIS — E1165 Type 2 diabetes mellitus with hyperglycemia: Secondary | ICD-10-CM | POA: Insufficient documentation

## 2015-03-26 DIAGNOSIS — Z23 Encounter for immunization: Secondary | ICD-10-CM

## 2015-03-26 LAB — GLUCOSE, POCT (MANUAL RESULT ENTRY)
POC Glucose: 304 mg/dl — AB (ref 70–99)
POC Glucose: 286 mg/dl — AB (ref 70–99)

## 2015-03-26 MED ORDER — METHYLPREDNISOLONE ACETATE 40 MG/ML IJ SUSP
40.0000 mg | Freq: Once | INTRAMUSCULAR | Status: AC
  Administered 2015-03-26: 40 mg via INTRA_ARTICULAR

## 2015-03-26 MED ORDER — INSULIN ASPART 100 UNIT/ML ~~LOC~~ SOLN
10.0000 [IU] | Freq: Once | SUBCUTANEOUS | Status: AC
  Administered 2015-03-26: 10 [IU] via SUBCUTANEOUS

## 2015-03-26 NOTE — Progress Notes (Signed)
Patient ID: Heidi [name], female   DOB: 1950-09-27, 64 y.o.   MRN: [id number]   Subjective:  Patient ID: Heidi [name], female    DOB: [birth year]  Age: 64 y.o. MRN: [id number]  CC: Knee Pain   HPI Heidi [name] presents for steroid injection of knee  1. B/l knee pain: morbidly obese, has DM2. Knee pain. B/l knee x-ray done last month with mild DJD. No redness or swelling.  2. Ear ithching: in PM. No fever or drainage. No decrease hearing.   3. Abnormal colonoscopy: done in 2015. Repeat colonoscopy in 3 years. Patient concerned and would like to see a GI doctor sooner to discuss. She has records. Records reviewed.   Social History  Substance Use Topics  . Smoking status: Never Smoker   . Smokeless tobacco: Not on file  . Alcohol Use: No    Outpatient Prescriptions Prior to Visit  Medication Sig Dispense Refill  . albuterol (PROVENTIL HFA;VENTOLIN HFA) 108 (90 BASE) MCG/ACT inhaler Inhale 2 puffs into the lungs every 6 (six) hours as needed for wheezing or shortness of breath. 1 Inhaler 3  . albuterol (PROVENTIL) (2.5 MG/3ML) 0.083% nebulizer solution Take 3 mLs (2.5 mg total) by nebulization every 6 (six) hours as needed for wheezing or shortness of breath. 150 mL 1  . aspirin 81 MG chewable tablet 81 mg daily.    Marland Kitchen atenolol (TENORMIN) 50 MG tablet Take 1 tablet (50 mg total) by mouth 2 (two) times daily. 60 tablet 5  . atorvastatin (LIPITOR) 40 MG tablet TAKE 1 TABLET(40 MG) BY MOUTH DAILY 6 PM 90 tablet 2  . bumetanide (BUMEX) 0.5 MG tablet Take 1 tablet (0.5 mg total) by mouth daily. 30 tablet 5  . calcium-vitamin D (OSCAL WITH D) 500-200 MG-UNIT per tablet Take 1 tablet by mouth daily. 30 tablet 2  . Fluticasone-Salmeterol (ADVAIR) 250-50 MCG/DOSE AEPB Inhale 1 puff into the lungs 2 (two) times daily.    Marland Kitchen glucose blood (FREESTYLE TEST STRIPS) test strip Use as instructed 100 each 12  . hydrochlorothiazide (HYDRODIURIL) 25 MG tablet Take 1 tablet (25 mg total) by mouth daily. 30  tablet 5  . HYDROcodone-acetaminophen (NORCO) 7.5-325 MG per tablet Take 1 tablet by mouth every 6 (six) hours as needed for moderate pain or severe pain.   0  . insulin lispro protamine-lispro (HUMALOG MIX 75/25) (75-25) 100 UNIT/ML SUSP injection Inject 75 Units into the skin 3 (three) times daily with meals. 70 mL 5  . Insulin Syringe-Needle U-100 (INSULIN SYRINGE 1CC/28G) 28G X 1/2" 1 ML MISC 1 each by Does not apply route 3 (three) times daily. 90 each 11  . Lancets (FREESTYLE) lancets Use as instructed 100 each 12  . lisinopril (PRINIVIL,ZESTRIL) 5 MG tablet Take 1 tablet (5 mg total) by mouth daily. 30 tablet 5  . loratadine (CLARITIN) 10 MG tablet Take 10 mg by mouth daily.  1  . montelukast (SINGULAIR) 10 MG tablet Take 1 tablet (10 mg total) by mouth daily. 30 tablet 1  . nitroGLYCERIN (NITROSTAT) 0.3 MG SL tablet Place 0.3 mg under the tongue every 5 (five) minutes as needed for chest pain.     Marland Kitchen omega-3 acid ethyl esters (LOVAZA) 1 G capsule Take 1,000 mg by mouth 2 (two) times daily.    Marland Kitchen omeprazole (PRILOSEC) 40 MG capsule Take 1 capsule (40 mg total) by mouth daily. 30 capsule 1  . potassium chloride (K-DUR) 10 MEQ tablet TAKE 1 TABLET(10 MEQ) BY MOUTH DAILY 90 tablet 2  .  potassium chloride (K-DUR) 10 MEQ tablet TAKE 1 TABLET(10 MEQ) BY MOUTH DAILY 30 tablet 0  . topiramate (TOPAMAX) 50 MG tablet Take 50 mg by mouth 2 (two) times daily.  2  . atorvastatin (LIPITOR) 40 MG tablet TAKE 1 TABLET(40 MG) BY MOUTH DAILY 6 PM (Patient not taking: Reported on 03/26/2015) 30 tablet 0  . benzonatate (TESSALON) 100 MG capsule Take 100 mg by mouth daily as needed for cough.     . guaifenesin (ROBITUSSIN) 100 MG/5ML syrup Take 200 mg by mouth 3 (three) times daily as needed for cough.     No facility-administered medications prior to visit.    ROS Review of Systems  Constitutional: Negative for fever and chills.  Eyes: Negative for visual disturbance.  Respiratory: Negative for shortness of  breath.   Cardiovascular: Negative for chest pain.  Gastrointestinal: Negative for abdominal pain and blood in stool.  Musculoskeletal: Positive for arthralgias. Negative for back pain.  Skin: Negative for rash.  Allergic/Immunologic: Negative for immunocompromised state.  Hematological: Negative for adenopathy. Does not bruise/bleed easily.  Psychiatric/Behavioral: Negative for suicidal ideas and dysphoric mood.    Objective:  BP 112/67 mmHg  Pulse 73  Temp(Src) 97.9 F (36.6 C) (Oral)  Resp 16  Ht 5\' 3"  (1.6 m)  Wt 245 lb (111.131 kg)  BMI 43.41 kg/m2  SpO2 95%  BP/Weight 03/26/2015 02/11/2015 4/69/6295  Systolic BP 284 132 440  Diastolic BP 67 78 71  Wt. (Lbs) 245 245 253.6  BMI 43.41 43.41 44.93    Physical Exam  Constitutional: She is oriented to person, place, and time. She appears well-developed and well-nourished. No distress.  HENT:  Right Ear: Tympanic membrane, external ear and ear canal normal.  Left Ear: Tympanic membrane, external ear and ear canal normal.  Cardiovascular: Normal rate.   Pulmonary/Chest: Effort normal and breath sounds normal.  Musculoskeletal: Normal range of motion. She exhibits no edema or tenderness.       Left knee: Normal.  Neurological: She is alert and oriented to person, place, and time.  Skin: Skin is warm and dry. No rash noted.  Psychiatric: She has a normal mood and affect.    After obtaining informed consent and cleaning the skin using iodine and alcohol a  steroid injection was performed at L knee using 1% plain Lidocaine and 40 mg of Depo Medrol. This was well tolerated   Assessment & Plan:   Problem List Items Addressed This Visit    Adenomatous polyp of colon   Relevant Orders   Ambulatory referral to Gastroenterology   Diabetes type 2, uncontrolled - Primary (Chronic)   Relevant Medications   insulin aspart (novoLOG) injection 10 Units (Completed)   Other Relevant Orders   POCT glucose (manual entry) (Completed)    POCT glucose (manual entry) (Completed)   Knee pain, bilateral (Chronic)   Relevant Medications   methylPREDNISolone acetate (DEPO-MEDROL) injection 40 mg (Completed)    Other Visit Diagnoses    Healthcare maintenance        Relevant Orders    Flu Vaccine QUAD 36+ mos IM       Meds ordered this encounter  Medications  . insulin aspart (novoLOG) injection 10 Units    Sig:     Follow-up: No Follow-up on file.   Boykin Nearing MD

## 2015-03-26 NOTE — Patient Instructions (Addendum)
[name] have received a shot of steroid in your joint today. Rest and ice knee today. Regular activity tomorrow. Look out for redness, swelling, fever,severe pain in joint and call if you experience these symptoms.   GI referral placed to discuss colonoscopy Ears exam normal  F/u for R knee injection in 1-2 weeks  Dr. Adrian Blackwater

## 2015-03-26 NOTE — Progress Notes (Signed)
C/C Pain on Knee bilateral  F/U Knee Inj  Pain scale #5 Elevated glucose 305 today Had Insulin Humalog 80 unit at 200am  Had egg sandwich one hr ago

## 2015-03-27 ENCOUNTER — Ambulatory Visit: Payer: Medicare Other

## 2015-03-27 DIAGNOSIS — M25619 Stiffness of unspecified shoulder, not elsewhere classified: Secondary | ICD-10-CM | POA: Diagnosis not present

## 2015-03-27 DIAGNOSIS — M436 Torticollis: Secondary | ICD-10-CM | POA: Diagnosis not present

## 2015-03-27 DIAGNOSIS — M25519 Pain in unspecified shoulder: Secondary | ICD-10-CM | POA: Diagnosis not present

## 2015-03-27 DIAGNOSIS — M25562 Pain in left knee: Secondary | ICD-10-CM | POA: Diagnosis not present

## 2015-03-27 DIAGNOSIS — M542 Cervicalgia: Principal | ICD-10-CM

## 2015-03-27 DIAGNOSIS — R29898 Other symptoms and signs involving the musculoskeletal system: Secondary | ICD-10-CM

## 2015-03-27 DIAGNOSIS — M25561 Pain in right knee: Secondary | ICD-10-CM | POA: Diagnosis not present

## 2015-03-27 NOTE — Therapy (Signed)
Heidi [name], [place], [address] Phone: [phone]   Fax:  [phone]  Physical Therapy Treatment  Patient Details  Name: Heidi [name] Date of Birth: August 12, 1950 Referring Provider:  Boykin Nearing, MD  Encounter Date: 03/27/2015      PT End of Session - 03/27/15 1140    Visit Number 3   Number of Visits 19   Date for PT Re-Evaluation 05/09/15   PT Start Time 1050   PT Stop Time 1150   PT Time Calculation (min) 60 min   Activity Tolerance Patient tolerated treatment well   Behavior During Therapy [facility] for tasks assessed/performed      Past Medical History  Diagnosis Date  . Asthma   . Hypertension   . Diabetes mellitus without complication     Past Surgical History  Procedure Laterality Date  . Abdominal hysterectomy      There were no vitals filed for this visit.  Visit Diagnosis:  Neck and shoulder pain  Stiffness of neck  Stiffness of shoulder joint, unspecified laterality  Arthralgia of both knees  Weakness of both legs      Subjective Assessment - 03/27/15 1054    Subjective LT Knee sore from injection.    Pain Score 3    Pain Location Knee   Pain Orientation Left;Right   Pain Descriptors / Indicators Aching;Cramping;Nagging   Pain Type Chronic pain   Pain Onset More than a month ago   Pain Frequency Constant   Multiple Pain Sites Yes                         [facility] Adult PT Treatment/Exercise - 03/27/15 1102    Knee/Hip Exercises: Aerobic   Nustep L4 6 min LE only   Knee/Hip Exercises: Standing   Heel Raises Both;10 reps   Other Standing Knee Exercises Marching x 15 RT and LT 4 pounds.  , then sit to stand x 8 with cue to bring hips in quickly   Knee/Hip Exercises: Seated   Long Arc Quad Strengthening;Right;Left;15 reps   Long Arc Quad Weight 4 lbs.   Marching Strengthening;Right;Left;15 reps   Marching Weights 4 lbs.   Moist Heat  Therapy   Number Minutes Moist Heat 15 Minutes   Moist Heat Location Cervical  in  sitting   Manual Therapy   Manual Therapy Passive ROM;Myofascial release;Joint mobilization   Joint Mobilization Cervical side glides upper cervical RT and LT x 12 2 sets   Soft tissue mobilization With Black rock blade LT posterior lateral and anterior LT neck    Myofascial Release boccipital release and STW to upper cervical spine.  in supine                  PT Short Term Goals - 03/13/15 1042    PT SHORT TERM GOAL #1   Title She will be independent with inital HEP   Time 2   Period Weeks   Status New   PT SHORT TERM GOAL #2   Title She will improve active neck rotation to 45 degrees RT and LT    Time 3   Period Weeks   Status New   PT SHORT TERM GOAL #3   Title She will report neck pain decr 30% or more    Time 3   Period Weeks   Status New   PT SHORT TERM GOAL #4   Title She will demo  understanding of good sitting psoture with support   Time 3   Period Weeks   Status New           PT Long Term Goals - 03/13/15 1044    PT LONG TERM GOAL #1   Title She will be independent with all HEP issued as of last visit.    Time 10   Period Weeks   Status New   PT LONG TERM GOAL #2   Title She will report pain decr 50% or more with normal activity and use of LT arm   Time 10   Period Weeks   Status New   PT LONG TERM GOAL #3   Title She ill improve neck rotation to 55 degrees to improve mobility and dcr pain.    Time 10   Period Weeks   Status New   PT LONG TERM GOAL #4   Title Her gait velocity will improve to walking 2 feet per second.    Time 10   Period Weeks   Status New   PT LONG TERM GOAL #5   Title She will report knee pain improved 50% with walking normal activity in and out of home   Time 10   Period Weeks   Status New               Plan - 03/27/15 1141    Clinical Impression Statement Still tender with STW but she tolerates this. Worked more in  upper cervical spne due to C/O HA and LT /anterior neck pain.   She did more LE exercise without complaint after   PT Next Visit Plan MAnual , heat, tape , stretching , LE strength.    PT Home Exercise Plan sit to stand with more quickness to deas load to knees    Consulted and Agree with Plan of Care Patient        Problem List Patient Active Problem List   Diagnosis Date Noted  . Adenomatous polyp of colon 03/26/2015  . HTN (hypertension) 02/12/2015  . Cognitive deficit due to old head trauma 02/12/2015  . Asthma, chronic 02/12/2015  . Health care maintenance 02/11/2015  . Chronic left shoulder pain 02/11/2015  . Knee pain, bilateral 02/11/2015  . Thyroid nodule 12/18/2014  . Neck pain   . HLD (hyperlipidemia)   . OSA treated with BiPAP 11/27/2014  . Obesity hypoventilation syndrome 11/27/2014  . Elevated troponin   . Demand ischemia   . Diabetes type 2, uncontrolled   . Cardiomegaly 11/25/2014  . CHF (congestive heart failure) 11/25/2014  . DM type 2 causing CKD stage 2 11/25/2014    Darrel Hoover PT 03/27/2015, 11:44 AM  [facility] [address] [place], [place], [address] Phone: [phone]   Fax:  [phone]

## 2015-03-29 ENCOUNTER — Ambulatory Visit: Payer: Medicare Other

## 2015-03-29 DIAGNOSIS — M25561 Pain in right knee: Secondary | ICD-10-CM

## 2015-03-29 DIAGNOSIS — M25562 Pain in left knee: Secondary | ICD-10-CM

## 2015-03-29 DIAGNOSIS — M25519 Pain in unspecified shoulder: Secondary | ICD-10-CM | POA: Diagnosis not present

## 2015-03-29 DIAGNOSIS — M542 Cervicalgia: Principal | ICD-10-CM

## 2015-03-29 DIAGNOSIS — M25619 Stiffness of unspecified shoulder, not elsewhere classified: Secondary | ICD-10-CM | POA: Diagnosis not present

## 2015-03-29 DIAGNOSIS — M436 Torticollis: Secondary | ICD-10-CM

## 2015-03-29 DIAGNOSIS — R29898 Other symptoms and signs involving the musculoskeletal system: Secondary | ICD-10-CM

## 2015-03-29 NOTE — Therapy (Signed)
[place], [place], [address] Phone: [phone]   Fax:  [phone]  Physical Therapy Treatment  Patient Details  Name: Heidi [name] MRN: [id number] Date of Birth: 1951-04-05 Referring Provider:  Boykin Nearing, MD  Encounter Date: 03/29/2015      PT End of Session - 03/29/15 1030    Visit Number 4   Number of Visits 19   Date for PT Re-Evaluation 05/09/15   PT Start Time 0928   PT Stop Time 1023   PT Time Calculation (min) 55 min   Activity Tolerance Patient tolerated treatment well   Behavior During Therapy [facility] for tasks assessed/performed      Past Medical History  Diagnosis Date  . Asthma   . Hypertension   . Diabetes mellitus without complication     Past Surgical History  Procedure Laterality Date  . Abdominal hysterectomy      There were no vitals filed for this visit.  Visit Diagnosis:  Neck and shoulder pain  Stiffness of neck  Stiffness of shoulder joint, unspecified laterality  Arthralgia of both knees  Weakness of both legs      Subjective Assessment - 03/29/15 0932    Subjective Continues with LT neck post/lateral and anterior neck pain. Knees sore.                          [facility] Adult PT Treatment/Exercise - 03/29/15 0001    Knee/Hip Exercises: Aerobic   Nustep L4 6 min LE and UE   Knee/Hip Exercises: Standing   Heel Raises Both;15 reps   Forward Step Up Right;Left;Hand Hold: 2;10 reps   Knee/Hip Exercises: Seated   Long Arc Quad Right;Left;10 reps   Long Arc Quad Weight 5 lbs.   Moist Heat Therapy   Number Minutes Moist Heat 15 Minutes   Moist Heat Location Cervical   Manual Therapy   Manual Therapy Passive ROM   Soft tissue mobilization With Black rock blade LT posterior lateral and anterior LT neck    Passive ROM Neck rotation , sidebending , levaor and pectorals x 2 each 30 sec                PT Education - 03/29/15 0933     Education provided Yes   Education Details How proper foot wear can decrease stess to feet and knees  by decresing pounds of force to knees.    Person(s) Educated Patient   Methods Explanation   Comprehension Verbalized understanding          PT Short Term Goals - 03/29/15 1015    PT SHORT TERM GOAL #1   Title She will be independent with inital HEP   Status Achieved   PT SHORT TERM GOAL #2   Title She will improve active neck rotation to 45 degrees RT and LT    Status On-going   PT SHORT TERM GOAL #3   Title She will report neck pain decr 30% or more    Status On-going   PT SHORT TERM GOAL #4   Title She will demo understanding of good sitting psoture with support   Status Achieved           PT Long Term Goals - 03/13/15 1044    PT LONG TERM GOAL #1   Title She will be independent with all HEP issued as of last visit.    Time 10   Period Weeks  Status New   PT LONG TERM GOAL #2   Title She will report pain decr 50% or more with normal activity and use of LT arm   Time 10   Period Weeks   Status New   PT LONG TERM GOAL #3   Title She ill improve neck rotation to 55 degrees to improve mobility and dcr pain.    Time 10   Period Weeks   Status New   PT LONG TERM GOAL #4   Title Her gait velocity will improve to walking 2 feet per second.    Time 10   Period Weeks   Status New   PT LONG TERM GOAL #5   Title She will report knee pain improved 50% with walking normal activity in and out of home   Time 10   Period Weeks   Status New               Plan - 03/29/15 1012    Clinical Impression Statement She reported Nustep made her head and neck hurt more. She was tender in scalenes mostly today. She did well with LE exercises. She was able to stand from chair with improved quickness    Pt will benefit from skilled therapeutic intervention in order to improve on the following deficits Difficulty walking;Decreased range of motion;Pain;Decreased  strength;Decreased activity tolerance;Postural dysfunction   PT Next Visit Plan Manual , heat, tape , stretching , LE strength.    Consulted and Agree with Plan of Care Patient        Problem List Patient Active Problem List   Diagnosis Date Noted  . Adenomatous polyp of colon 03/26/2015  . HTN (hypertension) 02/12/2015  . Cognitive deficit due to old head trauma 02/12/2015  . Asthma, chronic 02/12/2015  . Health care maintenance 02/11/2015  . Chronic left shoulder pain 02/11/2015  . Knee pain, bilateral 02/11/2015  . Thyroid nodule 12/18/2014  . Neck pain   . HLD (hyperlipidemia)   . OSA treated with BiPAP 11/27/2014  . Obesity hypoventilation syndrome 11/27/2014  . Elevated troponin   . Demand ischemia   . Diabetes type 2, uncontrolled   . Cardiomegaly 11/25/2014  . CHF (congestive heart failure) 11/25/2014  . DM type 2 causing CKD stage 2 11/25/2014    Darrel Hoover PT 03/29/2015, 10:34 AM  [facility] [address] [place], [place], [address] Phone: [phone]   Fax:  [phone]

## 2015-04-01 ENCOUNTER — Ambulatory Visit: Payer: Medicare Other

## 2015-04-01 DIAGNOSIS — M25619 Stiffness of unspecified shoulder, not elsewhere classified: Secondary | ICD-10-CM | POA: Diagnosis not present

## 2015-04-01 DIAGNOSIS — M25561 Pain in right knee: Secondary | ICD-10-CM | POA: Diagnosis not present

## 2015-04-01 DIAGNOSIS — M542 Cervicalgia: Principal | ICD-10-CM

## 2015-04-01 DIAGNOSIS — M436 Torticollis: Secondary | ICD-10-CM | POA: Diagnosis not present

## 2015-04-01 DIAGNOSIS — M25562 Pain in left knee: Secondary | ICD-10-CM

## 2015-04-01 DIAGNOSIS — R29898 Other symptoms and signs involving the musculoskeletal system: Secondary | ICD-10-CM

## 2015-04-01 DIAGNOSIS — M25519 Pain in unspecified shoulder: Secondary | ICD-10-CM | POA: Diagnosis not present

## 2015-04-01 NOTE — Therapy (Signed)
[place], [place], [address] Phone: [phone]   Fax:  [phone]  Physical Therapy Treatment  Patient Details  Name: Heidi [name] MRN: [id number] Date of Birth: Mar 23, 1951 Referring Provider:  Boykin Nearing, MD  Encounter Date: 04/01/2015      PT End of Session - 04/01/15 1140    Visit Number 5   Number of Visits 19   Date for PT Re-Evaluation 05/09/15   PT Start Time 1100   PT Stop Time 1155   PT Time Calculation (min) 55 min   Activity Tolerance Patient tolerated treatment well   Behavior During Therapy [facility] for tasks assessed/performed      Past Medical History  Diagnosis Date  . Asthma   . Hypertension   . Diabetes mellitus without complication     Past Surgical History  Procedure Laterality Date  . Abdominal hysterectomy      There were no vitals filed for this visit.  Visit Diagnosis:  Neck and shoulder pain  Stiffness of neck  Arthralgia of both knees  Weakness of both legs      Subjective Assessment - 04/01/15 1142    Subjective Doing ok today. Still with pain.    Currently in Pain? Yes   Pain Score 3    Pain Location Knee   Pain Orientation Left;Right   Multiple Pain Sites Yes   Pain Score 3   Pain Location Neck   Pain Orientation Left   Pain Descriptors / Indicators Aching   Pain Type Chronic pain   Pain Onset More than a month ago   Pain Frequency Constant   Aggravating Factors  activity and movement in neck   Pain Relieving Factors meds , heat                         [facility] Adult PT Treatment/Exercise - 04/01/15 1103    Neck Exercises: Supine   Neck Retraction 10 reps   Capital Flexion 10 reps   Capital Flexion Limitations manual assist.    Cervical Rotation 5 reps;Right;Left   Cervical Rotation Limitations manual assist   Lateral Flexion Right;Left;10 reps   Lateral Flexion Limitations manual assist   Knee/Hip Exercises: Aerobic   Nustep L5 6 min LE and UE   Knee/Hip Exercises: Standing   Other Standing Knee Exercises sit to stand x 10 without UE assist . pattern samooth and good control   Knee/Hip Exercises: Seated   Long Arc Quad Strengthening;Right;Left;15 reps   Long Arc Quad Weight 5 lbs.  3 sec hold   Knee/Hip Exercises: Supine   Bridges Limitations x 15 3 sec hold   Other Supine Knee/Hip Exercises clams with green band x 15   Manual Therapy   Joint Mobilization Cervical side glides upper cervical RT and LT x 12 2 sets and PA glides central and lateral 2 x 25 reps  with extra on LT side.    Myofascial Release suboccipital release and STW to upper cervical spine.  in supine   Passive ROM Neck rotation , sidebending , levaor and pectorals x 2 each 30 sec. ER LT shoulder normal.      HMP to neck in sitting .              PT Short Term Goals - 03/29/15 1015    PT SHORT TERM GOAL #1   Title She will be independent with inital HEP   Status Achieved   PT  SHORT TERM GOAL #2   Title She will improve active neck rotation to 45 degrees RT and LT    Status On-going   PT SHORT TERM GOAL #3   Title She will report neck pain decr 30% or more    Status On-going   PT SHORT TERM GOAL #4   Title She will demo understanding of good sitting psoture with support   Status Achieved           PT Long Term Goals - 03/13/15 1044    PT LONG TERM GOAL #1   Title She will be independent with all HEP issued as of last visit.    Time 10   Period Weeks   Status New   PT LONG TERM GOAL #2   Title She will report pain decr 50% or more with normal activity and use of LT arm   Time 10   Period Weeks   Status New   PT LONG TERM GOAL #3   Title She ill improve neck rotation to 55 degrees to improve mobility and dcr pain.    Time 10   Period Weeks   Status New   PT LONG TERM GOAL #4   Title Her gait velocity will improve to walking 2 feet per second.    Time 10   Period Weeks   Status New   PT LONG TERM GOAL  #5   Title She will report knee pain improved 50% with walking normal activity in and out of home   Time 10   Period Weeks   Status New               Plan - 04/01/15 1140    Clinical Impression Statement She was ok with nustep today. We did more LE exercise today  She continues woth stiffness in neck more on LT side. Will contnue manual and mayu ask for dry needle treatments if not progressing . She reports some headache with manual treatment but she is so still on upper LT cervical spine she needs mobs, stretching.    PT Next Visit Plan Manual , heat, tape , stretching , LE strength.    Consulted and Agree with Plan of Care Patient        Problem List Patient Active Problem List   Diagnosis Date Noted  . Adenomatous polyp of colon 03/26/2015  . HTN (hypertension) 02/12/2015  . Cognitive deficit due to old head trauma 02/12/2015  . Asthma, chronic 02/12/2015  . Health care maintenance 02/11/2015  . Chronic left shoulder pain 02/11/2015  . Knee pain, bilateral 02/11/2015  . Thyroid nodule 12/18/2014  . Neck pain   . HLD (hyperlipidemia)   . OSA treated with BiPAP 11/27/2014  . Obesity hypoventilation syndrome 11/27/2014  . Elevated troponin   . Demand ischemia   . Diabetes type 2, uncontrolled   . Cardiomegaly 11/25/2014  . CHF (congestive heart failure) 11/25/2014  . 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

## 2015-04-03 ENCOUNTER — Ambulatory Visit: Payer: Medicare Other

## 2015-04-04 ENCOUNTER — Ambulatory Visit: Payer: Medicare Other | Attending: Family Medicine | Admitting: Family Medicine

## 2015-04-04 ENCOUNTER — Encounter: Payer: Self-pay | Admitting: Family Medicine

## 2015-04-04 VITALS — BP 120/72 | HR 110 | Temp 100.2°F | Resp 18 | Ht 63.0 in | Wt 242.0 lb

## 2015-04-04 DIAGNOSIS — R058 Other specified cough: Secondary | ICD-10-CM | POA: Insufficient documentation

## 2015-04-04 DIAGNOSIS — E119 Type 2 diabetes mellitus without complications: Secondary | ICD-10-CM | POA: Diagnosis not present

## 2015-04-04 DIAGNOSIS — E1165 Type 2 diabetes mellitus with hyperglycemia: Secondary | ICD-10-CM | POA: Diagnosis not present

## 2015-04-04 DIAGNOSIS — IMO0002 Reserved for concepts with insufficient information to code with codable children: Secondary | ICD-10-CM

## 2015-04-04 DIAGNOSIS — R05 Cough: Secondary | ICD-10-CM | POA: Insufficient documentation

## 2015-04-04 LAB — GLUCOSE, POCT (MANUAL RESULT ENTRY): POC Glucose: 202 mg/dl — AB (ref 70–99)

## 2015-04-04 MED ORDER — AZITHROMYCIN 250 MG PO TABS
ORAL_TABLET | ORAL | Status: DC
Start: 2015-04-04 — End: 2015-04-12

## 2015-04-04 MED ORDER — BENZONATATE 100 MG PO CAPS: 100.0000 mg | ORAL_CAPSULE | Freq: Every day | ORAL | Status: DC | PRN

## 2015-04-04 MED ORDER — AZITHROMYCIN 250 MG PO TABS: ORAL_TABLET | ORAL | Status: DC

## 2015-04-04 MED ORDER — GUAIFENESIN-CODEINE 100-10 MG/5ML PO SOLN: 10.0000 mL | Freq: Three times a day (TID) | ORAL | Status: DC | PRN

## 2015-04-04 MED ORDER — LORATADINE 10 MG PO TABS: 10.0000 mg | ORAL_TABLET | Freq: Every day | ORAL | Status: DC

## 2015-04-04 NOTE — Patient Instructions (Addendum)
Heidi [name] was seen today for influenza.  Diagnoses and all orders for this visit:  Diabetes type 2, uncontrolled -     Glucose (CBG)  Productive cough -     azithromycin (ZITHROMAX) 250 MG tablet; 500 mg today, then 250 mg daily for 4 days -     DG Chest 2 View; Future -     benzonatate (TESSALON) 100 MG capsule; Take 1 capsule (100 mg total) by mouth daily as needed for cough.  tylenol 1000 mg every 8 hrs as needed for pain or fever Continue to use inhalers as needed Robitussin or tessalon perles for cough  Seek medical attention if you develop chest pain, shortness of breath, temperature > 101.4 which is considered  fever, if temperature does not go down with tylenol  F/u in 7-10 days if you are not improving  F/u for diabetes in 6 weeks   Dr. Adrian Blackwater

## 2015-04-04 NOTE — Progress Notes (Signed)
C/C flu like Sx x 2 days No hx tobacco  Elevated glucose today

## 2015-04-04 NOTE — Assessment & Plan Note (Deleted)
A: productive cough and myalgias in patient with poorly controlled diabetes, asthma and CHF.  DDx uri with cough, bronchitis.  P: Azithromycin and CXR tylenol 1000 mg every 8 hrs as needed for pain or fever Continue to use inhalers as needed Robitussin or tessalon perles for cough

## 2015-04-04 NOTE — Assessment & Plan Note (Addendum)
A: productive cough and myalgias in patient with poorly controlled diabetes, asthma and CHF.  DDx uri with cough, bronchitis. Weight is stable and there are no crackles so CHF exacerbation is unlikely.  P: Azithromycin and CXR tylenol 1000 mg every 8 hrs as needed for pain or fever Continue to use inhalers as needed Robitussin with codeine for cough

## 2015-04-04 NOTE — Progress Notes (Signed)
Subjective:  Patient ID: Heidi [name], female    DOB: 08-05-1950  Age: 64 y.o. MRN: [id number]  CC: Influenza  HPI [name] presents for cold symptoms. She has uncontrolled type 2 diabetes and asthma:   1. Cold symptoms: x 2 days. Productive cough. Fatigue. Aches. Subjective fever at home. Patient took tylenol 500 mg today 3 hrs ago. Sick contacts of her grandsons. She reports possible exposure to excessive dust or fumes at home. She continues to use her inhalers.   Social History  Substance Use Topics  . Smoking status: Never Smoker   . Smokeless tobacco: Not on file  . Alcohol Use: No    Outpatient Prescriptions Prior to Visit  Medication Sig Dispense Refill  . albuterol (PROVENTIL HFA;VENTOLIN HFA) 108 (90 BASE) MCG/ACT inhaler Inhale 2 puffs into the lungs every 6 (six) hours as needed for wheezing or shortness of breath. 1 Inhaler 3  . albuterol (PROVENTIL) (2.5 MG/3ML) 0.083% nebulizer solution Take 3 mLs (2.5 mg total) by nebulization every 6 (six) hours as needed for wheezing or shortness of breath. 150 mL 1  . aspirin 81 MG chewable tablet 81 mg daily.    Marland Kitchen atenolol (TENORMIN) 50 MG tablet Take 1 tablet (50 mg total) by mouth 2 (two) times daily. 60 tablet 5  . atorvastatin (LIPITOR) 40 MG tablet TAKE 1 TABLET(40 MG) BY MOUTH DAILY 6 PM 90 tablet 2  . atorvastatin (LIPITOR) 40 MG tablet TAKE 1 TABLET(40 MG) BY MOUTH DAILY 6 PM (Patient not taking: Reported on 03/26/2015) 30 tablet 0  . benzonatate (TESSALON) 100 MG capsule Take 100 mg by mouth daily as needed for cough.     . bumetanide (BUMEX) 0.5 MG tablet Take 1 tablet (0.5 mg total) by mouth daily. 30 tablet 5  . calcium-vitamin D (OSCAL WITH D) 500-200 MG-UNIT per tablet Take 1 tablet by mouth daily. 30 tablet 2  . Fluticasone-Salmeterol (ADVAIR) 250-50 MCG/DOSE AEPB Inhale 1 puff into the lungs 2 (two) times daily.    Marland Kitchen glucose blood (FREESTYLE TEST STRIPS) test strip Use as instructed 100 each 12  . guaifenesin  (ROBITUSSIN) 100 MG/5ML syrup Take 200 mg by mouth 3 (three) times daily as needed for cough.    . hydrochlorothiazide (HYDRODIURIL) 25 MG tablet Take 1 tablet (25 mg total) by mouth daily. 30 tablet 5  . HYDROcodone-acetaminophen (NORCO) 7.5-325 MG per tablet Take 1 tablet by mouth every 6 (six) hours as needed for moderate pain or severe pain.   0  . insulin lispro protamine-lispro (HUMALOG MIX 75/25) (75-25) 100 UNIT/ML SUSP injection Inject 75 Units into the skin 3 (three) times daily with meals. 70 mL 5  . Insulin Syringe-Needle U-100 (INSULIN SYRINGE 1CC/28G) 28G X 1/2" 1 ML MISC 1 each by Does not apply route 3 (three) times daily. 90 each 11  . Lancets (FREESTYLE) lancets Use as instructed 100 each 12  . lisinopril (PRINIVIL,ZESTRIL) 5 MG tablet Take 1 tablet (5 mg total) by mouth daily. 30 tablet 5  . loratadine (CLARITIN) 10 MG tablet Take 10 mg by mouth daily.  1  . montelukast (SINGULAIR) 10 MG tablet Take 1 tablet (10 mg total) by mouth daily. 30 tablet 1  . nitroGLYCERIN (NITROSTAT) 0.3 MG SL tablet Place 0.3 mg under the tongue every 5 (five) minutes as needed for chest pain.     Marland Kitchen omega-3 acid ethyl esters (LOVAZA) 1 G capsule Take 1,000 mg by mouth 2 (two) times daily.    Marland Kitchen omeprazole (  PRILOSEC) 40 MG capsule Take 1 capsule (40 mg total) by mouth daily. 30 capsule 1  . potassium chloride (K-DUR) 10 MEQ tablet TAKE 1 TABLET(10 MEQ) BY MOUTH DAILY 90 tablet 2  . potassium chloride (K-DUR) 10 MEQ tablet TAKE 1 TABLET(10 MEQ) BY MOUTH DAILY 30 tablet 0  . topiramate (TOPAMAX) 50 MG tablet Take 50 mg by mouth 2 (two) times daily.  2   No facility-administered medications prior to visit.    ROS Review of Systems  Constitutional: Positive for fever. Negative for chills.  HENT: Positive for congestion.   Eyes: Negative for visual disturbance.  Respiratory: Positive for cough. Negative for shortness of breath.   Cardiovascular: Negative for chest pain.  Gastrointestinal: Negative  for abdominal pain and blood in stool.  Musculoskeletal: Positive for myalgias and arthralgias. Negative for back pain.  Skin: Negative for rash.  Allergic/Immunologic: Negative for immunocompromised state.  Hematological: Negative for adenopathy. Does not bruise/bleed easily.  Psychiatric/Behavioral: Positive for sleep disturbance. Negative for suicidal ideas and dysphoric mood.    Objective:  BP 120/72 mmHg  Pulse 110  Temp(Src) 100.2 F (37.9 C) (Oral)  Resp 18  Ht 5\' 3"  (1.6 m)  Wt 242 lb (109.77 kg)  BMI 42.88 kg/m2  SpO2 95%  BP/Weight 04/04/2015 9/47/6546 5/0/3546  Systolic BP 568 127 517  Diastolic BP 72 67 78  Wt. (Lbs) 242 245 245  BMI 42.88 43.41 43.41   Pulse Readings from Last 3 Encounters:  04/04/15 110  03/26/15 73  02/11/15 68    Physical Exam  Constitutional: She is oriented to person, place, and time. She appears well-developed and well-nourished. No distress.  HENT:  Head: Normocephalic and atraumatic.  Right Ear: External ear normal.  Left Ear: External ear normal.  Nose: Mucosal edema present.  Mouth/Throat: Oropharynx is clear and moist.  Eyes: Conjunctivae and EOM are normal. Pupils are equal, round, and reactive to light.  Cardiovascular: Normal rate, regular rhythm, normal heart sounds and intact distal pulses.   Pulmonary/Chest: Effort normal. No respiratory distress. She has wheezes (R side). She has no rales. She exhibits no tenderness.  Musculoskeletal: She exhibits no edema.  Neurological: She is alert and oriented to person, place, and time.  Skin: Skin is warm and dry. No rash noted.  Psychiatric: She has a normal mood and affect.   Lab Results  Component Value Date   HGBA1C 10.90 02/11/2015   CBG 202   Patient treated with albuterol treatment x one, neb  Assessment & Plan:   Problem List Items Addressed This Visit    Diabetes type 2, uncontrolled - Primary (Chronic)   Relevant Orders   Glucose (CBG) (Completed)   Productive  cough    A: productive cough and myalgias in patient with poorly controlled diabetes, asthma and CHF.  DDx uri with cough, bronchitis. Weight is stable and there are no crackles so CHF exacerbation is unlikely.  P: Azithromycin and CXR tylenol 1000 mg every 8 hrs as needed for pain or fever Continue to use inhalers as needed Robitussin with codeine for cough      Relevant Medications   azithromycin (ZITHROMAX) 250 MG tablet   guaiFENesin-codeine 100-10 MG/5ML syrup   Other Relevant Orders   DG Chest 2 View      No orders of the defined types were placed in this encounter.    Follow-up: No Follow-up on file.   Boykin Nearing MD

## 2015-04-05 ENCOUNTER — Ambulatory Visit (HOSPITAL_COMMUNITY)
Admission: RE | Admit: 2015-04-05 | Discharge: 2015-04-05 | Disposition: A | Discharge status: Home / Self Care | Payer: Medicare Other | Source: Ambulatory Visit | Attending: Family Medicine | Admitting: Family Medicine

## 2015-04-05 DIAGNOSIS — R918 Other nonspecific abnormal finding of lung field: Secondary | ICD-10-CM | POA: Diagnosis not present

## 2015-04-05 DIAGNOSIS — R05 Cough: Secondary | ICD-10-CM | POA: Insufficient documentation

## 2015-04-05 DIAGNOSIS — R058 Other specified cough: Secondary | ICD-10-CM

## 2015-04-08 ENCOUNTER — Other Ambulatory Visit: Payer: Self-pay | Admitting: Family Medicine

## 2015-04-08 ENCOUNTER — Telehealth: Payer: Self-pay | Admitting: Family Medicine

## 2015-04-08 NOTE — Telephone Encounter (Signed)
Pt is calling regarding for Xrays results.Heidi KitchenMarland KitchenMarland KitchenMarland Kitchenplease call patient is really concern and would like to receive her results as soon as possible. Please call.

## 2015-04-09 ENCOUNTER — Emergency Department (HOSPITAL_COMMUNITY): Payer: Medicare Other

## 2015-04-09 ENCOUNTER — Emergency Department (HOSPITAL_COMMUNITY)
Admission: EM | Admit: 2015-04-09 | Discharge: 2015-04-09 | Disposition: A | Discharge status: Home / Self Care | Payer: Medicare Other | Attending: Emergency Medicine | Admitting: Emergency Medicine

## 2015-04-09 ENCOUNTER — Encounter (HOSPITAL_COMMUNITY): Payer: Self-pay | Admitting: Emergency Medicine

## 2015-04-09 DIAGNOSIS — Z794 Long term (current) use of insulin: Secondary | ICD-10-CM | POA: Insufficient documentation

## 2015-04-09 DIAGNOSIS — J45909 Unspecified asthma, uncomplicated: Secondary | ICD-10-CM | POA: Insufficient documentation

## 2015-04-09 DIAGNOSIS — J45901 Unspecified asthma with (acute) exacerbation: Secondary | ICD-10-CM

## 2015-04-09 DIAGNOSIS — Z8701 Personal history of pneumonia (recurrent): Secondary | ICD-10-CM | POA: Diagnosis not present

## 2015-04-09 DIAGNOSIS — R05 Cough: Secondary | ICD-10-CM | POA: Diagnosis present

## 2015-04-09 DIAGNOSIS — I10 Essential (primary) hypertension: Secondary | ICD-10-CM | POA: Insufficient documentation

## 2015-04-09 DIAGNOSIS — R918 Other nonspecific abnormal finding of lung field: Secondary | ICD-10-CM | POA: Diagnosis not present

## 2015-04-09 DIAGNOSIS — Z7982 Long term (current) use of aspirin: Secondary | ICD-10-CM | POA: Insufficient documentation

## 2015-04-09 DIAGNOSIS — Z7951 Long term (current) use of inhaled steroids: Secondary | ICD-10-CM | POA: Diagnosis not present

## 2015-04-09 DIAGNOSIS — Z792 Long term (current) use of antibiotics: Secondary | ICD-10-CM | POA: Diagnosis not present

## 2015-04-09 DIAGNOSIS — E119 Type 2 diabetes mellitus without complications: Secondary | ICD-10-CM | POA: Diagnosis not present

## 2015-04-09 DIAGNOSIS — R1111 Vomiting without nausea: Secondary | ICD-10-CM | POA: Diagnosis not present

## 2015-04-09 DIAGNOSIS — Z79899 Other long term (current) drug therapy: Secondary | ICD-10-CM | POA: Diagnosis not present

## 2015-04-09 DIAGNOSIS — R7309 Other abnormal glucose: Secondary | ICD-10-CM | POA: Diagnosis not present

## 2015-04-09 LAB — CBC WITH DIFFERENTIAL/PLATELET
Basophils Absolute: 0 10*3/uL (ref 0.0–0.1)
Basophils Relative: 0 %
EOS PCT: 5 %
Eosinophils Absolute: 0.4 10*3/uL (ref 0.0–0.7)
HEMATOCRIT: 37.1 % (ref 36.0–46.0)
Hemoglobin: 11.9 g/dL — ABNORMAL LOW (ref 12.0–15.0)
LYMPHS ABS: 3.6 10*3/uL (ref 0.7–4.0)
LYMPHS PCT: 44 %
MCH: 27.3 pg (ref 26.0–34.0)
MCHC: 32.1 g/dL (ref 30.0–36.0)
MCV: 85.1 fL (ref 78.0–100.0)
MONO ABS: 0.5 10*3/uL (ref 0.1–1.0)
Monocytes Relative: 6 %
Neutro Abs: 3.7 10*3/uL (ref 1.7–7.7)
Neutrophils Relative %: 45 %
PLATELETS: 209 10*3/uL (ref 150–400)
RBC: 4.36 MIL/uL (ref 3.87–5.11)
RDW: 15.1 % (ref 11.5–15.5)
WBC: 8.1 10*3/uL (ref 4.0–10.5)

## 2015-04-09 LAB — BASIC METABOLIC PANEL
Anion gap: 8 (ref 5–15)
BUN: 18 mg/dL (ref 6–20)
CO2: 24 mmol/L (ref 22–32)
Calcium: 9.7 mg/dL (ref 8.9–10.3)
Chloride: 108 mmol/L (ref 101–111)
Creatinine, Ser: 1.27 mg/dL — ABNORMAL HIGH (ref 0.44–1.00)
GFR calc Af Amer: 51 mL/min — ABNORMAL LOW (ref >60)
GFR, EST NON AFRICAN AMERICAN: 44 mL/min — AB (ref >60)
GLUCOSE: 233 mg/dL — AB (ref 65–99)
POTASSIUM: 3.6 mmol/L (ref 3.5–5.1)
Sodium: 140 mmol/L (ref 135–145)

## 2015-04-09 MED ORDER — IPRATROPIUM BROMIDE 0.02 % IN SOLN
1.0000 mg | Freq: Once | RESPIRATORY_TRACT | Status: AC
  Administered 2015-04-09: 1 mg via RESPIRATORY_TRACT
  Filled 2015-04-09: qty 5

## 2015-04-09 MED ORDER — MAGNESIUM SULFATE 2 GM/50ML IV SOLN
2.0000 g | Freq: Once | INTRAVENOUS | Status: AC
  Administered 2015-04-09: 2 g via INTRAVENOUS
  Filled 2015-04-09: qty 50

## 2015-04-09 MED ORDER — ALBUTEROL (5 MG/ML) CONTINUOUS INHALATION SOLN
10.0000 mg/h | INHALATION_SOLUTION | RESPIRATORY_TRACT | Status: DC
  Administered 2015-04-09: 10 mg/h via RESPIRATORY_TRACT
  Filled 2015-04-09: qty 40

## 2015-04-09 MED ORDER — PREDNISONE 20 MG PO TABS
60.0000 mg | ORAL_TABLET | Freq: Once | ORAL | Status: AC
  Administered 2015-04-09: 60 mg via ORAL
  Filled 2015-04-09: qty 3

## 2015-04-09 MED ORDER — PREDNISONE 20 MG PO TABS: 60.0000 mg | ORAL_TABLET | Freq: Every day | ORAL | Status: DC

## 2015-04-09 NOTE — ED Notes (Signed)
Pt. reports persistent productive cough with chest congestion and SOB onset last week , pt. had just completed her oral antibiotic for URI yesterday with no improvement . Denies fever or chills.

## 2015-04-09 NOTE — Discharge Instructions (Signed)
Asthma Heidi [name], continue to use an albuterol inhaler every 6 hours for the next 2 days.  Take steroids every day for 4 more days.  See a primary care doctor within 3 days for close follow up.  If symptoms worsen, come back to the ED immediately.  Thank you.   Asthma is a condition of the lungs in which the airways tighten and narrow. Asthma can make it hard to breathe. Asthma cannot be cured, but medicine and lifestyle changes can help control it. Asthma may be started (triggered) by:  Animal skin flakes (dander).  Dust.  Cockroaches.  Pollen.  Mold.  Smoke.  Cleaning products.  Hair sprays or aerosol sprays.  Paint fumes or strong smells.  Cold air, weather changes, and winds.  Crying or laughing hard.  Stress.  Certain medicines or drugs.  Foods, such as dried fruit, potato chips, and sparkling grape juice.  Infections or conditions (colds, flu).  Exercise.  Certain medical conditions or diseases.  Exercise or tiring activities. HOME CARE   Take medicine as told by your doctor.  Use a peak flow meter as told by your doctor. A peak flow meter is a tool that measures how well the lungs are working.  Record and keep track of the peak flow meter's readings.  Understand and use the asthma action plan. An asthma action plan is a written plan for taking care of your asthma and treating your attacks.  To help prevent asthma attacks:  Do not smoke. Stay away from secondhand smoke.  Change your heating and air conditioning filter often.  Limit your use of fireplaces and wood stoves.  Get rid of pests (such as roaches and mice) and their droppings.  Throw away plants if you see mold on them.  Clean your floors. Dust regularly. Use cleaning products that do not smell.  Have someone vacuum when you are not home. Use a vacuum cleaner with a HEPA filter if possible.  Replace carpet with wood, tile, or vinyl flooring. Carpet can trap animal skin flakes and  dust.  Use allergy-proof pillows, mattress covers, and box spring covers.  Wash bed sheets and blankets every week in hot water and dry them in a dryer.  Use blankets that are made of polyester or cotton.  Clean bathrooms and kitchens with bleach. If possible, have someone repaint the walls in these rooms with mold-resistant paint. Keep out of the rooms that are being cleaned and painted.  Wash hands often. GET HELP IF:  You have make a whistling sound when breaking (wheeze), have shortness of breath, or have a cough even if taking medicine to prevent attacks.  The colored mucus you cough up (sputum) is thicker than usual.  The colored mucus you cough up changes from clear or white to yellow, green, gray, or bloody.  You have problems from the medicine you are taking such as:  A rash.  Itching.  Swelling.  Trouble breathing.  You need reliever medicines more than 2-3 times a week.  Your peak flow measurement is still at 50-79% of your personal best after following the action plan for 1 hour.  You have a fever. GET HELP RIGHT AWAY IF:   You seem to be worse and are not responding to medicine during an asthma attack.  You are short of breath even at rest.  You get short of breath when doing very little activity.  You have trouble eating, drinking, or talking.  You have chest pain.  You  have a fast heartbeat.  Your lips or fingernails start to turn blue.  You are light-headed, dizzy, or faint.  Your peak flow is less than 50% of your personal best. MAKE SURE YOU:   Understand these instructions.  Will watch your condition.  Will get help right away if you are not doing well or get worse. Document Released: 12/09/2007 Document Revised: 11/06/2013 Document Reviewed: 01/19/2013 [facility] Patient Information 2015 [place], [place]. This information is not intended to replace advice given to you by your health care provider. Make sure you discuss any questions you  have with your health care provider.

## 2015-04-09 NOTE — ED Notes (Signed)
Pt left with all her belongings and was wheeled out of the treatment area.  

## 2015-04-09 NOTE — ED Provider Notes (Signed)
CSN: [id number]     Arrival date & time 04/09/15  0125 History  By signing my name below, I, Heidi [name], attest that this documentation has been prepared under the direction and in the presence of [name], MD. Electronically Signed: Sonum [name], Education administrator. 04/09/2015. 3:46 AM.    Chief Complaint  Patient presents with  . Cough    The history is provided by the patient. No language interpreter was used.     HPI Comments: Heidi [name] is a 64 y.o. female with past medical history of asthma who presents to the Emergency Department complaining of persistent, intermittent, unchanged productive cough with associated chills, wheezing, and chest congestion. She reports green sputum production. She was started on azithromycin on 04/04/15 which she completed yesterday. She denies fever.    Past Medical History  Diagnosis Date  . Asthma   . Hypertension   . Diabetes mellitus without complication [facility])    Past Surgical History  Procedure Laterality Date  . Abdominal hysterectomy     Family History  Problem Relation Age of Onset  . Colon cancer Father   . Diabetes type II Sister   . Diabetes type II Brother   . Colon cancer Sister    Social History  Substance Use Topics  . Smoking status: Never Smoker   . Smokeless tobacco: None  . Alcohol Use: No   OB History    No data available     Review of Systems  10 Systems reviewed and all are negative for acute change except as noted in the HPI.   Allergies  Review of patient's allergies indicates no known allergies.  Home Medications   Prior to Admission medications   Medication Sig Start Date End Date Taking? Authorizing Provider  albuterol (PROVENTIL HFA;VENTOLIN HFA) 108 (90 BASE) MCG/ACT inhaler Inhale 2 puffs into the lungs every 6 (six) hours as needed for wheezing or shortness of breath. 12/31/14   Arnoldo Morale, MD  albuterol (PROVENTIL) (2.5 MG/3ML) 0.083% nebulizer solution Take 3 mLs (2.5 mg total) by nebulization every  6 (six) hours as needed for wheezing or shortness of breath. 12/11/14   Arnoldo Morale, MD  aspirin 81 MG chewable tablet 81 mg daily.    Historical Provider, MD  atenolol (TENORMIN) 50 MG tablet Take 1 tablet (50 mg total) by mouth 2 (two) times daily. 02/11/15   Josalyn Funches, MD  atorvastatin (LIPITOR) 40 MG tablet TAKE 1 TABLET(40 MG) BY MOUTH DAILY 6 PM 03/15/15   Josalyn Funches, MD  atorvastatin (LIPITOR) 40 MG tablet TAKE 1 TABLET(40 MG) BY MOUTH DAILY 6 PM Patient not taking: Reported on 03/26/2015 03/15/15   Boykin Nearing, MD  azithromycin (ZITHROMAX) 250 MG tablet 500 mg today, then 250 mg daily for 4 days 04/04/15   Boykin Nearing, MD  bumetanide (BUMEX) 0.5 MG tablet Take 1 tablet (0.5 mg total) by mouth daily. Patient not taking: Reported on 04/04/2015 02/11/15   Boykin Nearing, MD  calcium-vitamin D (OSCAL WITH D) 500-200 MG-UNIT per tablet Take 1 tablet by mouth daily. 12/18/14   Arnoldo Morale, MD  Fluticasone-Salmeterol (ADVAIR) 250-50 MCG/DOSE AEPB Inhale 1 puff into the lungs 2 (two) times daily. 10/15/14   Historical Provider, MD  glucose blood (FREESTYLE TEST STRIPS) test strip Use as instructed 12/31/14   Arnoldo Morale, MD  guaifenesin (ROBITUSSIN) 100 MG/5ML syrup Take 200 mg by mouth 3 (three) times daily as needed for cough.    Historical Provider, MD  guaiFENesin-codeine 100-10 MG/5ML syrup Take 10 mLs  by mouth 3 (three) times daily as needed for cough. 04/04/15   Josalyn Funches, MD  hydrochlorothiazide (HYDRODIURIL) 25 MG tablet Take 1 tablet (25 mg total) by mouth daily. 02/11/15   Boykin Nearing, MD  HYDROcodone-acetaminophen (NORCO) 7.5-325 MG per tablet Take 1 tablet by mouth every 6 (six) hours as needed for moderate pain or severe pain.  11/19/14   Historical Provider, MD  insulin lispro protamine-lispro (HUMALOG MIX 75/25) (75-25) 100 UNIT/ML SUSP injection Inject 75 Units into the skin 3 (three) times daily with meals. 02/18/15   Josalyn Funches, MD  Insulin Syringe-Needle U-100  (INSULIN SYRINGE 1CC/28G) 28G X 1/2" 1 ML MISC 1 each by Does not apply route 3 (three) times daily. 02/11/15   Boykin Nearing, MD  Lancets (FREESTYLE) lancets Use as instructed 12/11/14   Arnoldo Morale, MD  lisinopril (PRINIVIL,ZESTRIL) 5 MG tablet Take 1 tablet (5 mg total) by mouth daily. 02/07/15   Josalyn Funches, MD  loratadine (CLARITIN) 10 MG tablet Take 1 tablet (10 mg total) by mouth daily. 04/04/15   Josalyn Funches, MD  montelukast (SINGULAIR) 10 MG tablet Take 1 tablet (10 mg total) by mouth daily. 12/02/14   Kinnie Feil, MD  nitroGLYCERIN (NITROSTAT) 0.3 MG SL tablet Place 0.3 mg under the tongue every 5 (five) minutes as needed for chest pain.     Historical Provider, MD  omega-3 acid ethyl esters (LOVAZA) 1 G capsule Take 1,000 mg by mouth 2 (two) times daily.    Historical Provider, MD  omeprazole (PRILOSEC) 40 MG capsule Take 1 capsule (40 mg total) by mouth daily. 02/21/15   Josalyn Funches, MD  potassium chloride (K-DUR) 10 MEQ tablet TAKE 1 TABLET(10 MEQ) BY MOUTH DAILY 03/15/15   Josalyn Funches, MD  potassium chloride (K-DUR) 10 MEQ tablet TAKE 1 TABLET(10 MEQ) BY MOUTH DAILY 03/15/15   Boykin Nearing, MD  topiramate (TOPAMAX) 50 MG tablet Take 50 mg by mouth 2 (two) times daily. 11/01/14   Historical Provider, MD   BP 131/66 mmHg  Pulse 75  Temp(Src) 97.9 F (36.6 C) (Oral)  Resp 28  Ht 5\' 3"  (1.6 m)  Wt 240 lb (108.863 kg)  BMI 42.52 kg/m2  SpO2 93% Physical Exam  Constitutional: She is oriented to person, place, and time. She appears well-developed and well-nourished. No distress.  HENT:  Head: Normocephalic and atraumatic.  Nose: Nose normal.  Mouth/Throat: Oropharynx is clear and moist. No oropharyngeal exudate.  Eyes: Conjunctivae and EOM are normal. Pupils are equal, round, and reactive to light. No scleral icterus.  Neck: Normal range of motion. Neck supple. No JVD present. No tracheal deviation present. No thyromegaly present.  Cardiovascular: Normal rate, regular  rhythm and normal heart sounds.  Exam reveals no gallop and no friction rub.   No murmur heard. Pulmonary/Chest: Effort normal. No respiratory distress. She has wheezes (expiratory wheezing bilaterally). She exhibits no tenderness.  Abdominal: Soft. Bowel sounds are normal. She exhibits no distension and no mass. There is no tenderness. There is no rebound and no guarding.  Musculoskeletal: Normal range of motion. She exhibits edema (BLE). She exhibits no tenderness.  Lymphadenopathy:    She has no cervical adenopathy.  Neurological: She is alert and oriented to person, place, and time. No cranial nerve deficit. She exhibits normal muscle tone.  Skin: Skin is warm and dry. No rash noted. No erythema. No pallor.  Nursing note and vitals reviewed.   ED Course  Procedures (including critical care time)  DIAGNOSTIC STUDIES: Oxygen Saturation  is 93% on RA, adequate by my interpretation.    COORDINATION OF CARE: 2:59 AM Discussed treatment plan with pt at bedside and pt agreed to plan.   Labs Review Labs Reviewed  CBC WITH DIFFERENTIAL/PLATELET - Abnormal; Notable for the following:    Hemoglobin 11.9 (*)    All other components within normal limits  BASIC METABOLIC PANEL - Abnormal; Notable for the following:    Glucose, Bld 233 (*)    Creatinine, Ser 1.27 (*)    GFR calc non Af Amer 44 (*)    GFR calc Af Amer 51 (*)    All other components within normal limits    Imaging Review Dg Chest 2 View  04/09/2015   CLINICAL DATA:  Acute onset of cough, chest congestion and shortness of breath. Initial encounter.  EXAM: CHEST  2 VIEW  COMPARISON:  Chest radiograph performed 04/05/2015  FINDINGS: The lungs are well-aerated. Vascular congestion is noted, with increased interstitial markings, compatible with pulmonary edema. Small bilateral pleural effusions are seen. There is no evidence of pneumothorax.  The heart is mildly enlarged. No acute osseous abnormalities are seen.  IMPRESSION:  Vascular congestion and mild cardiomegaly, with increased interstitial markings, compatible with pulmonary edema. Small bilateral pleural effusions seen.   Electronically Signed   By: Garald Balding M.D.   On: 04/09/2015 02:11   Ct Chest Wo Contrast  04/09/2015   CLINICAL DATA:  Acute onset of productive cough, chest congestion and shortness of breath. Initial encounter.  EXAM: CT CHEST WITHOUT CONTRAST  TECHNIQUE: Multidetector CT imaging of the chest was performed following the standard protocol without IV contrast.  COMPARISON:  Chest radiograph performed earlier today at 1:29 a.m.  FINDINGS: Mild airspace opacity within the left lower lobe may reflect atelectasis or possibly mild residual pneumonia. Minimal atelectasis is noted at the medial aspect of the right middle lobe. The lungs are otherwise clear. No pleural effusion or pneumothorax is seen. No masses are identified.  The mediastinum is unremarkable in appearance. Scattered coronary artery calcifications are seen. No mediastinal lymphadenopathy is seen. No pericardial effusion is identified. The great vessels are grossly unremarkable in appearance. Minimal calcification is noted at the left thyroid lobe. The visualized portions of the thyroid gland are otherwise unremarkable. No axillary lymphadenopathy is seen.  The visualized portions of the liver and the spleen are unremarkable in appearance. A 5.8 cm cyst is noted at the upper pole of the left kidney, with mild associated peripheral calcification. The visualized portions of the pancreas and gallbladder are unremarkable.  No acute osseous abnormalities are identified.  IMPRESSION: 1. Mild airspace opacity within the left lower lung lobe may reflect atelectasis or possibly mild residual pneumonia. Minimal atelectasis at the medial aspect of the right middle lobe. Lungs otherwise clear. 2. Scattered coronary artery calcification seen. 3. Left renal cyst, with mild associated peripheral calcification.    Electronically Signed   By: Garald Balding M.D.   On: 04/09/2015 04:19   I have personally reviewed and evaluated these images and lab results as part of my medical decision-making.   EKG Interpretation None      MDM   Final diagnoses:  None    Patient presents to the ED for productive cough and SOB.  She states this has been going on all week and she has had wheezing as well.  She was given z pack for recent pneumonia but symptoms persist.  On exam she was wheezing, she was given albuterol, ipratropium, steroids, and  magnesium for treatment.  Alb inhaler was refilled.  CT reveals likely resolving pneumonia, I do not believe patient needs further antibiotics as pneumonias are known to stay on imaging longer than the clinical presentation.    After treatment, patient feels better.  Her wheezing is completely gone.  She was provided with prednisone for the next 4 days to use and PCP fu rec within 3 days for close follow up.  She appears well and in NAD.  Her VS remain within her norma limits and she is safe for DC.  I personally performed the services described in this documentation, which was scribed in my presence. The recorded information has been reviewed and is accurate.   [name], MD 04/09/15 [phone]

## 2015-04-10 ENCOUNTER — Ambulatory Visit: Payer: Medicare Other

## 2015-04-10 NOTE — Telephone Encounter (Signed)
-----   Message from Boykin Nearing, MD sent at 04/05/2015  3:46 PM EDT ----- CXR with right middle and left lower lobe infiltrate consistent with community acquired PNA Complete azithromycin Call if symptoms worsen or fail to improve F/u CXR in 6-8 weeks

## 2015-04-10 NOTE — Telephone Encounter (Signed)
Date of Birth verified by pt  CXR results given, advised to continue taking Azithromycin. Pt stated was ED due to Asthma problems Feeling better now

## 2015-04-11 ENCOUNTER — Telehealth: Payer: Self-pay

## 2015-04-11 DIAGNOSIS — R058 Other specified cough: Secondary | ICD-10-CM

## 2015-04-11 DIAGNOSIS — R05 Cough: Secondary | ICD-10-CM

## 2015-04-12 NOTE — Telephone Encounter (Signed)
Unable to contact pt. Mail box is full 

## 2015-04-12 NOTE — Telephone Encounter (Signed)
Azithromycin will not be refilled Patient to complete prednisone and take cough medicine  Patient to schedule f/u Cough after pneumonia can linger, however additional antibiotics are rarely needed

## 2015-04-15 ENCOUNTER — Ambulatory Visit: Payer: Medicare Other | Attending: Family Medicine

## 2015-04-15 ENCOUNTER — Ambulatory Visit: Payer: Medicare Other | Attending: Family Medicine | Admitting: Family Medicine

## 2015-04-15 ENCOUNTER — Encounter: Payer: Self-pay | Admitting: Family Medicine

## 2015-04-15 VITALS — BP 112/69 | HR 72 | Temp 98.5°F | Resp 16 | Ht 63.0 in | Wt 249.0 lb

## 2015-04-15 DIAGNOSIS — Z794 Long term (current) use of insulin: Secondary | ICD-10-CM | POA: Insufficient documentation

## 2015-04-15 DIAGNOSIS — M25562 Pain in left knee: Secondary | ICD-10-CM | POA: Diagnosis not present

## 2015-04-15 DIAGNOSIS — M25561 Pain in right knee: Secondary | ICD-10-CM | POA: Insufficient documentation

## 2015-04-15 DIAGNOSIS — R29898 Other symptoms and signs involving the musculoskeletal system: Secondary | ICD-10-CM

## 2015-04-15 DIAGNOSIS — M436 Torticollis: Secondary | ICD-10-CM | POA: Insufficient documentation

## 2015-04-15 DIAGNOSIS — J45909 Unspecified asthma, uncomplicated: Secondary | ICD-10-CM | POA: Insufficient documentation

## 2015-04-15 DIAGNOSIS — M25519 Pain in unspecified shoulder: Secondary | ICD-10-CM | POA: Insufficient documentation

## 2015-04-15 DIAGNOSIS — Z7982 Long term (current) use of aspirin: Secondary | ICD-10-CM | POA: Diagnosis not present

## 2015-04-15 DIAGNOSIS — M542 Cervicalgia: Secondary | ICD-10-CM | POA: Insufficient documentation

## 2015-04-15 DIAGNOSIS — E1165 Type 2 diabetes mellitus with hyperglycemia: Secondary | ICD-10-CM | POA: Diagnosis not present

## 2015-04-15 DIAGNOSIS — IMO0001 Reserved for inherently not codable concepts without codable children: Secondary | ICD-10-CM

## 2015-04-15 DIAGNOSIS — Z79899 Other long term (current) drug therapy: Secondary | ICD-10-CM | POA: Diagnosis not present

## 2015-04-15 DIAGNOSIS — M25619 Stiffness of unspecified shoulder, not elsewhere classified: Secondary | ICD-10-CM | POA: Insufficient documentation

## 2015-04-15 LAB — GLUCOSE, POCT (MANUAL RESULT ENTRY): POC Glucose: 147 mg/dl — AB (ref 70–99)

## 2015-04-15 MED ORDER — METHYLPREDNISOLONE ACETATE 40 MG/ML IJ SUSP
40.0000 mg | Freq: Once | INTRAMUSCULAR | Status: AC
  Administered 2015-04-15: 40 mg via INTRA_ARTICULAR

## 2015-04-15 MED ORDER — LORATADINE 10 MG PO TABS: 10.0000 mg | ORAL_TABLET | Freq: Every day | ORAL | Status: DC

## 2015-04-15 MED ORDER — FLUTICASONE-SALMETEROL 500-50 MCG/DOSE IN AEPB: 1.0000 | INHALATION_SPRAY | Freq: Two times a day (BID) | RESPIRATORY_TRACT | Status: DC

## 2015-04-15 NOTE — Therapy (Signed)
Heidi [name], [place], [address] Phone: [phone]   Fax:  [phone]  Physical Therapy Treatment  Patient Details  Name: Heidi [name] MRN: [id number] Date of Birth: 1950/11/11 Referring Provider:  Boykin Nearing, MD  Encounter Date: 04/15/2015      PT End of Session - 04/15/15 1119    PT Start Time 1100   PT Stop Time 1106   PT Time Calculation (min) 6 min      Past Medical History  Diagnosis Date  . Asthma   . Hypertension   . Diabetes mellitus without complication [facility])     Past Surgical History  Procedure Laterality Date  . Abdominal hysterectomy      There were no vitals filed for this visit.  Visit Diagnosis:  Arthralgia of both knees  Weakness of both legs      Subjective Assessment - 04/15/15 1110    Subjective I've had the flu. I still have a little. I have spit up as recently as yesterday.                                    PT Short Term Goals - 03/29/15 1015    PT SHORT TERM GOAL #1   Title She will be independent with inital HEP   Status Achieved   PT SHORT TERM GOAL #2   Title She will improve active neck rotation to 45 degrees RT and LT    Status On-going   PT SHORT TERM GOAL #3   Title She will report neck pain decr 30% or more    Status On-going   PT SHORT TERM GOAL #4   Title She will demo understanding of good sitting psoture with support   Status Achieved           PT Long Term Goals - 03/13/15 1044    PT LONG TERM GOAL #1   Title She will be independent with all HEP issued as of last visit.    Time 10   Period Weeks   Status New   PT LONG TERM GOAL #2   Title She will report pain decr 50% or more with normal activity and use of LT arm   Time 10   Period Weeks   Status New   PT LONG TERM GOAL #3   Title She ill improve neck rotation to 55 degrees to improve mobility and dcr pain.    Time 10   Period Weeks   Status  New   PT LONG TERM GOAL #4   Title Her gait velocity will improve to walking 2 feet per second.    Time 10   Period Weeks   Status New   PT LONG TERM GOAL #5   Title She will report knee pain improved 50% with walking normal activity in and out of home   Time 10   Period Weeks   Status New               Plan - 04/15/15 1119    Clinical Impression Statement [name] appears to still be having symptoms of the flu.. I felt it best to ahve her return home for a week to give her time to recover fully. She will call to reschedule today for next week.    Consulted and Agree with Plan of Care Patient     On reviewing the ED  note it appears she did not have the flu. Will continue PT when she returns without worry of flu symptoms   Problem List Patient Active Problem List   Diagnosis Date Noted  . Productive cough 04/04/2015  . Adenomatous polyp of colon 03/26/2015  . HTN (hypertension) 02/12/2015  . Cognitive deficit due to old head trauma 02/12/2015  . Asthma, chronic 02/12/2015  . Health care maintenance 02/11/2015  . Chronic left shoulder pain 02/11/2015  . Knee pain, bilateral 02/11/2015  . Thyroid nodule 12/18/2014  . Neck pain   . HLD (hyperlipidemia)   . OSA treated with BiPAP 11/27/2014  . Obesity hypoventilation syndrome ([place]) 11/27/2014  . Elevated troponin   . Demand ischemia ([place])   . Diabetes type 2, uncontrolled ([place])   . Cardiomegaly 11/25/2014  . CHF (congestive heart failure) ([place]) 11/25/2014  . 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 [place], [place], [address] Phone: [phone]   Fax:  [phone]

## 2015-04-15 NOTE — Progress Notes (Signed)
Patient ID: Heidi [name], female   DOB: 1951/05/21, 64 y.o.   MRN: [id number]   Subjective:  Patient ID: Heidi [name], female    DOB: 1951-02-06  Age: 64 y.o. MRN: [id number]  CC: Follow-up and Asthma   HPI Heidi [name] presents for   1. Cough: patient presents for f/u cough. After completing azithromycin prescribed here for CAP on 04/04/2015. She went to ED on  04/09/2015. She was wheezing at the times an treated with albuterol, ipratropium, steroids and magnesium. CT chest was done. No indication for repeat antibiotics. Today she reports persistent cough that is intermittently productive. No fever. Using albuterol BID. Taking mucinex to break up congestion. She has finished prednisone.   2. B/l knee pain: requesting R knee steroid injection. L knee is feeling good since injection. No joint redness or swelling.   Social History  Substance Use Topics  . Smoking status: Never Smoker   . Smokeless tobacco: Not on file  . Alcohol Use: No   Outpatient Prescriptions Prior to Visit  Medication Sig Dispense Refill  . albuterol (PROVENTIL HFA;VENTOLIN HFA) 108 (90 BASE) MCG/ACT inhaler Inhale 2 puffs into the lungs every 6 (six) hours as needed for wheezing or shortness of breath. 1 Inhaler 3  . albuterol (PROVENTIL) (2.5 MG/3ML) 0.083% nebulizer solution Take 3 mLs (2.5 mg total) by nebulization every 6 (six) hours as needed for wheezing or shortness of breath. 150 mL 1  . aspirin 81 MG chewable tablet 81 mg daily.    Marland Kitchen atenolol (TENORMIN) 50 MG tablet Take 1 tablet (50 mg total) by mouth 2 (two) times daily. 60 tablet 5  . calcium-vitamin D (OSCAL WITH D) 500-200 MG-UNIT per tablet Take 1 tablet by mouth daily. 30 tablet 2  . Fluticasone-Salmeterol (ADVAIR) 250-50 MCG/DOSE AEPB Inhale 1 puff into the lungs 2 (two) times daily.    Marland Kitchen glucose blood (FREESTYLE TEST STRIPS) test strip Use as instructed 100 each 12  . hydrochlorothiazide (HYDRODIURIL) 25 MG tablet Take 1 tablet (25 mg total) by mouth  daily. 30 tablet 5  . HYDROcodone-acetaminophen (NORCO) 7.5-325 MG per tablet Take 1 tablet by mouth every 6 (six) hours as needed for moderate pain or severe pain.   0  . insulin lispro protamine-lispro (HUMALOG MIX 75/25) (75-25) 100 UNIT/ML SUSP injection Inject 75 Units into the skin 3 (three) times daily with meals. 70 mL 5  . Insulin Syringe-Needle U-100 (INSULIN SYRINGE 1CC/28G) 28G X 1/2" 1 ML MISC 1 each by Does not apply route 3 (three) times daily. 90 each 11  . Lancets (FREESTYLE) lancets Use as instructed 100 each 12  . lisinopril (PRINIVIL,ZESTRIL) 5 MG tablet Take 1 tablet (5 mg total) by mouth daily. 30 tablet 5  . loratadine (CLARITIN) 10 MG tablet Take 1 tablet (10 mg total) by mouth daily. 30 tablet 5  . montelukast (SINGULAIR) 10 MG tablet Take 1 tablet (10 mg total) by mouth daily. 30 tablet 1  . nitroGLYCERIN (NITROSTAT) 0.3 MG SL tablet Place 0.3 mg under the tongue every 5 (five) minutes as needed for chest pain.     Marland Kitchen omega-3 acid ethyl esters (LOVAZA) 1 G capsule Take 1,000 mg by mouth 2 (two) times daily.    Marland Kitchen omeprazole (PRILOSEC) 40 MG capsule Take 1 capsule (40 mg total) by mouth daily. 30 capsule 1  . potassium chloride (K-DUR) 10 MEQ tablet TAKE 1 TABLET(10 MEQ) BY MOUTH DAILY 90 tablet 2  . predniSONE (DELTASONE) 20 MG tablet Take 3 tablets (60  mg total) by mouth daily. 12 tablet 0  . topiramate (TOPAMAX) 50 MG tablet Take 50 mg by mouth 2 (two) times daily.  2  . guaifenesin (ROBITUSSIN) 100 MG/5ML syrup Take 200 mg by mouth 3 (three) times daily as needed for cough.    Marland Kitchen guaiFENesin-codeine 100-10 MG/5ML syrup Take 10 mLs by mouth 3 (three) times daily as needed for cough. (Patient not taking: Reported on 04/15/2015) 120 mL 0  . potassium chloride (K-DUR) 10 MEQ tablet TAKE 1 TABLET(10 MEQ) BY MOUTH DAILY (Patient not taking: Reported on 04/15/2015) 30 tablet 0   No facility-administered medications prior to visit.    ROS Review of Systems  Constitutional:  Negative for fever and chills.  Eyes: Negative for visual disturbance.  Respiratory: Positive for cough. Negative for shortness of breath.   Cardiovascular: Negative for chest pain.  Gastrointestinal: Negative for abdominal pain and blood in stool.  Musculoskeletal: Positive for arthralgias. Negative for back pain.  Skin: Negative for rash.  Allergic/Immunologic: Negative for immunocompromised state.  Hematological: Negative for adenopathy. Does not bruise/bleed easily.  Psychiatric/Behavioral: Negative for suicidal ideas and dysphoric mood.    Objective:  BP 112/69 mmHg  Pulse 72  Temp(Src) 98.5 F (36.9 C) (Oral)  Resp 16  Ht 5\' 3"  (1.6 m)  Wt 249 lb (112.946 kg)  BMI 44.12 kg/m2  SpO2 94%  BP/Weight 04/15/2015 04/09/2015 8/75/7972  Systolic BP 820 601 561  Diastolic BP 69 53 72  Wt. (Lbs) 249 240 242  BMI 44.12 42.52 42.88   Physical Exam  Constitutional: She is oriented to person, place, and time. She appears well-developed and well-nourished. No distress.  HENT:  Head: Normocephalic and atraumatic.  Cardiovascular: Normal rate, regular rhythm, normal heart sounds and intact distal pulses.   Pulmonary/Chest: Effort normal. She has wheezes (L upper lobe ).  Musculoskeletal: She exhibits no edema.  Neurological: She is alert and oriented to person, place, and time.  Skin: Skin is warm and dry. No rash noted.  Psychiatric: She has a normal mood and affect.   CBG 147 Lab Results  Component Value Date   HGBA1C 10.90 02/11/2015     After obtaining informed consent and cleaning the skin using iodine and alcohol a  steroid injection was performed at R knee  using 1% plain Lidocaine and 40 mg of Depo Medrol. This was well tolerated.   Assessment & Plan:   Problem List Items Addressed This Visit    Asthma, chronic (Chronic)   Relevant Medications   Fluticasone-Salmeterol (ADVAIR DISKUS) 500-50 MCG/DOSE AEPB   loratadine (CLARITIN) 10 MG tablet   methylPREDNISolone  acetate (DEPO-MEDROL) injection 40 mg (Start on 04/15/2015  4:15 PM)   Diabetes type 2, uncontrolled ([employer]) - Primary (Chronic)   Relevant Orders   POCT glucose (manual entry) (Completed)   Knee pain, bilateral (Chronic)   Relevant Medications   methylPREDNISolone acetate (DEPO-MEDROL) injection 40 mg (Start on 04/15/2015  4:15 PM)      No orders of the defined types were placed in this encounter.    Follow-up: Return in about 4 weeks (around 05/13/2015) for DM and asthma .   Boykin Nearing MD

## 2015-04-15 NOTE — Progress Notes (Signed)
HFU Asthma  Stated still with productive cough  No pain today

## 2015-04-15 NOTE — Patient Instructions (Addendum)
Heidi [name] was seen today for follow-up and asthma.  Diagnoses and all orders for this visit:  Uncontrolled diabetes mellitus type 2 without complications, unspecified long term insulin use status (HCC) -     POCT glucose (manual entry)  Asthma, chronic, unspecified asthma severity, uncomplicated -     Discontinue: loratadine (CLARITIN) 10 MG tablet; Take 1 tablet (10 mg total) by mouth daily. -     Fluticasone-Salmeterol (ADVAIR DISKUS) 500-50 MCG/DOSE AEPB; Inhale 1 puff into the lungs 2 (two) times daily. -     loratadine (CLARITIN) 10 MG tablet; Take 1 tablet (10 mg total) by mouth daily.    You have received a shot of steroid in your joint today. Rest and ice knee today. Regular activity tomorrow. Look out for redness, swelling, fever,severe pain in joint and call if you experience these symptoms.  F/u in 4-6 weeks for asthma and diabetes   Dr. Adrian Blackwater

## 2015-04-16 ENCOUNTER — Other Ambulatory Visit: Payer: Self-pay | Admitting: Family Medicine

## 2015-04-19 ENCOUNTER — Other Ambulatory Visit: Payer: Self-pay

## 2015-04-19 MED ORDER — OMEPRAZOLE 40 MG PO CPDR: 40.0000 mg | DELAYED_RELEASE_CAPSULE | Freq: Every day | ORAL | Status: DC

## 2015-04-19 NOTE — Telephone Encounter (Signed)
Patient called to request a med refill for omeprazole (PRILOSEC) 40 MG capsule. Please f/u with pt.

## 2015-04-26 ENCOUNTER — Ambulatory Visit: Payer: Medicare Other

## 2015-05-01 ENCOUNTER — Ambulatory Visit: Payer: Medicare Other

## 2015-05-03 ENCOUNTER — Ambulatory Visit: Payer: Medicare Other

## 2015-05-03 DIAGNOSIS — M542 Cervicalgia: Secondary | ICD-10-CM | POA: Diagnosis not present

## 2015-05-03 DIAGNOSIS — R29898 Other symptoms and signs involving the musculoskeletal system: Secondary | ICD-10-CM

## 2015-05-03 DIAGNOSIS — M25519 Pain in unspecified shoulder: Secondary | ICD-10-CM

## 2015-05-03 DIAGNOSIS — M436 Torticollis: Secondary | ICD-10-CM

## 2015-05-03 DIAGNOSIS — M25619 Stiffness of unspecified shoulder, not elsewhere classified: Secondary | ICD-10-CM | POA: Diagnosis not present

## 2015-05-03 DIAGNOSIS — M25561 Pain in right knee: Secondary | ICD-10-CM | POA: Diagnosis not present

## 2015-05-03 DIAGNOSIS — M25562 Pain in left knee: Secondary | ICD-10-CM | POA: Diagnosis not present

## 2015-05-03 NOTE — Therapy (Signed)
Heidi [name], [place], [address] Phone: [phone]   Fax:  [phone]  Physical Therapy Treatment  Patient Details  Name: Heidi [name] MRN: [id number] Date of Birth: September 03, 1950 No Data Recorded  Encounter Date: 05/03/2015      PT End of Session - 05/03/15 1010    Visit Number 6   Number of Visits 19   Date for PT Re-Evaluation 05/31/15   PT Start Time 0930   PT Stop Time 1028   PT Time Calculation (min) 58 min   Activity Tolerance Patient tolerated treatment well;Patient limited by pain   Behavior During Therapy [facility] for tasks assessed/performed      Past Medical History  Diagnosis Date  . Asthma   . Hypertension   . Diabetes mellitus without complication [facility])     Past Surgical History  Procedure Laterality Date  . Abdominal hysterectomy      There were no vitals filed for this visit.  Visit Diagnosis:  Arthralgia of both knees - Plan: PT plan of care cert/re-cert  Weakness of both legs - Plan: PT plan of care cert/re-cert  Neck and shoulder pain - Plan: PT plan of care cert/re-cert  Stiffness of neck - Plan: PT plan of care cert/re-cert  Stiffness of shoulder joint, unspecified laterality - Plan: PT plan of care cert/re-cert      Subjective Assessment - 05/03/15 0937    Subjective Neck and knees about the same. I received Botox in neck. She is not certain she gained any benefit from this and I advised to return to MD. I had alot of pain after exercises last time.   Currently in Pain? Yes   Pain Score 5    Pain Location Back  neck and head   Pain Orientation Lower;Right;Left;Posterior  neck   Pain Descriptors / Indicators Aching;Cramping   Pain Type Chronic pain   Pain Onset More than a month ago   Pain Frequency Constant   Aggravating Factors  Activity   Pain Relieving Factors meds.    Multiple Pain Sites Yes            [facility] PT Assessment - 05/03/15 0001    AROM   Right Shoulder Flexion 110 Degrees   Right Shoulder ABduction 110 Degrees   Left Shoulder Flexion 110 Degrees   Left Shoulder ABduction 112 Degrees   Right Knee Extension 0   Right Knee Flexion 0   Left Knee Extension 0   Left Knee Flexion 0   Cervical Flexion 15   Cervical Extension 3   Cervical - Right Side Bend 18   Cervical - Left Side Bend 12   Cervical - Right Rotation 38   Cervical - Left Rotation 30                     [facility] Adult PT Treatment/Exercise - 05/03/15 0955    Neck Exercises: Seated   Shoulder Rolls Backwards;10 reps   Shoulder ABduction Both;10 reps   Other Seated Exercise Extension with toracic extension  over chair with shoulder hor abdcuction   Other Seated Exercise trunk rotation x 5 RT / LT. with PT asssist.    Neck Exercises: Supine   Cervical Rotation Left;Both   Cervical Rotation Limitations manual assist x 2 15 sec    Modalities   Modalities Electrical Stimulation   Moist Heat Therapy   Number Minutes Moist Heat 15 Minutes   Moist Heat Location Cervical;Lumbar Spine  Acupuncturist Location neck and back   Electrical Stimulation Action IFC   Electrical Stimulation Parameters L8   Electrical Stimulation Goals Pain                  PT Short Term Goals - 2015/05/31 1013    PT SHORT TERM GOAL #1   Title She will be independent with inital HEP   Status Achieved   PT SHORT TERM GOAL #2   Title She will improve active neck rotation to 45 degrees RT and LT    Status On-going   PT SHORT TERM GOAL #3   Title She will report neck pain decr 30% or more    Status On-going   PT SHORT TERM GOAL #4   Title She will demo understanding of good sitting psoture with support   Status On-going           PT Long Term Goals - 2015/05/31 1014    PT LONG TERM GOAL #1   Title She will be independent with all HEP issued as of last visit.    PT LONG TERM GOAL #2   Title She will report pain decr 50% or  more with normal activity and use of LT arm   Status On-going   PT LONG TERM GOAL #3   Title She ill improve neck rotation to 55 degrees to improve mobility and dcr pain.    Status On-going   PT LONG TERM GOAL #4   Title Her gait velocity will improve to walking 2 feet per second.    Status On-going   PT LONG TERM GOAL #5   Title She will report knee pain improved 50% with walking normal activity in and out of home   Status On-going               Plan - May 31, 2015 1010    Clinical Impression Statement Heidi [name] is no better in past monthand it does not appear she has any tolerance for exercise but we need to continue to work on this. I will add dry needling to plan as she has recieved botox and tolerated this. . It does not appear we will be very affective in easing her pain but if she will do a basic stretch and strenght program she may do better as time goes on. If no better in 6-7 visits will diischarge then   PT Next Visit Plan Manual , heat,  , stretching , LE strength. stretch neck and back   Consulted and Agree with Plan of Care Patient          G-Codes - May 31, 2015 1014    Functional Assessment Tool Used FOTO 48%   Functional Limitation Carrying, moving and handling objects   Carrying, Moving and Handling Objects Current Status (S5681) At least 40 percent but less than 60 percent impaired, limited or restricted   Carrying, Moving and Handling Objects Goal Status (E7517) At least 20 percent but less than 40 percent impaired, limited or restricted      Problem List Patient Active Problem List   Diagnosis Date Noted  . Productive cough 04/04/2015  . Adenomatous polyp of colon 03/26/2015  . HTN (hypertension) 02/12/2015  . Cognitive deficit due to old head trauma 02/12/2015  . Asthma, chronic 02/12/2015  . Health care maintenance 02/11/2015  . Chronic left shoulder pain 02/11/2015  . Knee pain, bilateral 02/11/2015  . Thyroid nodule 12/18/2014  . Neck pain   . HLD  (  hyperlipidemia)   . OSA treated with BiPAP 11/27/2014  . Obesity hypoventilation syndrome ([place]) 11/27/2014  . Elevated troponin   . Demand ischemia ([place])   . Diabetes type 2, uncontrolled ([place])   . Cardiomegaly 11/25/2014  . CHF (congestive heart failure) ([place]) 11/25/2014  . 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 [place], [place], [address] Phone: [phone]   Fax:  [phone]  Name: Heidi [name] MRN: [id number] Date of Birth: 07-04-1951

## 2015-05-07 ENCOUNTER — Ambulatory Visit: Payer: Medicare Other

## 2015-05-08 ENCOUNTER — Ambulatory Visit: Payer: Medicare Other | Attending: Family Medicine | Admitting: Family Medicine

## 2015-05-08 ENCOUNTER — Encounter: Payer: Self-pay | Admitting: Family Medicine

## 2015-05-08 VITALS — BP 122/73 | HR 67 | Temp 98.2°F | Resp 16 | Ht 63.0 in | Wt 248.2 lb

## 2015-05-08 DIAGNOSIS — Q6100 Congenital renal cyst, unspecified: Secondary | ICD-10-CM | POA: Diagnosis not present

## 2015-05-08 DIAGNOSIS — M654 Radial styloid tenosynovitis [de Quervain]: Secondary | ICD-10-CM | POA: Diagnosis not present

## 2015-05-08 DIAGNOSIS — K219 Gastro-esophageal reflux disease without esophagitis: Secondary | ICD-10-CM | POA: Insufficient documentation

## 2015-05-08 DIAGNOSIS — I10 Essential (primary) hypertension: Secondary | ICD-10-CM | POA: Insufficient documentation

## 2015-05-08 DIAGNOSIS — E785 Hyperlipidemia, unspecified: Secondary | ICD-10-CM | POA: Diagnosis not present

## 2015-05-08 DIAGNOSIS — J45909 Unspecified asthma, uncomplicated: Secondary | ICD-10-CM | POA: Insufficient documentation

## 2015-05-08 DIAGNOSIS — E1165 Type 2 diabetes mellitus with hyperglycemia: Secondary | ICD-10-CM | POA: Insufficient documentation

## 2015-05-08 DIAGNOSIS — Z794 Long term (current) use of insulin: Secondary | ICD-10-CM | POA: Diagnosis not present

## 2015-05-08 DIAGNOSIS — R51 Headache: Secondary | ICD-10-CM

## 2015-05-08 DIAGNOSIS — G8929 Other chronic pain: Secondary | ICD-10-CM | POA: Insufficient documentation

## 2015-05-08 DIAGNOSIS — R519 Headache, unspecified: Secondary | ICD-10-CM | POA: Insufficient documentation

## 2015-05-08 DIAGNOSIS — IMO0001 Reserved for inherently not codable concepts without codable children: Secondary | ICD-10-CM

## 2015-05-08 DIAGNOSIS — N281 Cyst of kidney, acquired: Secondary | ICD-10-CM | POA: Insufficient documentation

## 2015-05-08 LAB — GLUCOSE, POCT (MANUAL RESULT ENTRY): POC Glucose: 113 mg/dl — AB (ref 70–99)

## 2015-05-08 MED ORDER — OMEGA-3-ACID ETHYL ESTERS 1 G PO CAPS: 1000.0000 mg | ORAL_CAPSULE | Freq: Two times a day (BID) | ORAL | Status: DC

## 2015-05-08 MED ORDER — LISINOPRIL 5 MG PO TABS: 5.0000 mg | ORAL_TABLET | Freq: Every day | ORAL | Status: DC

## 2015-05-08 MED ORDER — MONTELUKAST SODIUM 10 MG PO TABS: 10.0000 mg | ORAL_TABLET | Freq: Every day | ORAL | Status: DC

## 2015-05-08 MED ORDER — BENZONATATE 100 MG PO CAPS: 100.0000 mg | ORAL_CAPSULE | Freq: Two times a day (BID) | ORAL | Status: DC | PRN

## 2015-05-08 MED ORDER — HYDROCHLOROTHIAZIDE 25 MG PO TABS: 25.0000 mg | ORAL_TABLET | Freq: Every day | ORAL | Status: DC

## 2015-05-08 MED ORDER — KETOROLAC TROMETHAMINE 30 MG/ML IJ SOLN
30.0000 mg | Freq: Once | INTRAMUSCULAR | Status: AC
  Administered 2015-05-08: 30 mg via INTRAMUSCULAR

## 2015-05-08 NOTE — Patient Instructions (Addendum)
Heidi [name] was seen today for hand pain.  Diagnoses and all orders for this visit:  Uncontrolled type 2 diabetes mellitus without complication, with long-term current use of insulin (HCC) -     Glucose (CBG)  De Quervain's tenosynovitis, right -     DG Wrist Complete Right; Future  Essential hypertension -     lisinopril (PRINIVIL,ZESTRIL) 5 MG tablet; Take 1 tablet (5 mg total) by mouth daily. -     hydrochlorothiazide (HYDRODIURIL) 25 MG tablet; Take 1 tablet (25 mg total) by mouth daily.  Asthma, chronic, unspecified asthma severity, uncomplicated -     montelukast (SINGULAIR) 10 MG tablet; Take 1 tablet (10 mg total) by mouth daily. -     benzonatate (TESSALON) 100 MG capsule; Take 1 capsule (100 mg total) by mouth 2 (two) times daily as needed for cough.  HLD (hyperlipidemia) -     omega-3 acid ethyl esters (LOVAZA) 1 G capsule; Take 1 capsule (1,000 mg total) by mouth 2 (two) times daily.   Toradol shot, icing at home and bracing for wrist. Wear brace for 12 hrs a day.  Next step PT Final step referral to sports medicine for injection   F/u in 6 weeks for R wrist pain  Dr. Bryson Corona Tenosynovitis Tendons attach muscles to bones. They also help with joint movements. When tendons become irritated or swollen, it is called tendinitis. The extensor pollicis brevis (EPB) tendon connects the EPB muscle to a bone that is near the base of the thumb. The EPB muscle helps to straighten and extend the thumb. De Quervain tenosynovitis is a condition in which the EPB tendon lining (sheath) becomes irritated, thickened, and swollen. This condition is sometimes called stenosing tenosynovitis. This condition causes pain on the thumb side of the back of the wrist. CAUSES Causes of this condition include:  Activities that repeatedly cause your thumb and wrist to extend.  A sudden increase in activity or change in activity that affects your wrist. RISK FACTORS: This condition is more  likely to develop in:  Females.  People who have diabetes.  Women who have recently given birth.  People who are over 94 years of age.  People who do activities that involve repeated hand and wrist motions, such as tennis, racquetball, volleyball, gardening, and taking care of children.  People who do heavy labor.  People who have poor wrist strength and flexibility.  People who do not warm up properly before activities. SYMPTOMS Symptoms of this condition include:  Pain or tenderness over the thumb side of the back of the wrist when your thumb and wrist are not moving.  Pain that gets worse when you straighten your thumb or extend your thumb or wrist.  Pain when the injured area is touched.  Locking or catching of the thumb joint while you bend and straighten your thumb.  Decreased thumb motion due to pain.  Swelling over the affected area. DIAGNOSIS This condition is diagnosed with a medical history and physical exam. Your health care provider will ask for details about your injury and ask about your symptoms. TREATMENT Treatment may include the use of icing and medicines to reduce pain and swelling. You may also be advised to wear a splint or brace to limit your thumb and wrist motion. In less severe cases, treatment may also include working with a physical therapist to strengthen your wrist and calm the irritation around your EPB tendon sheath. In severe cases, surgery may be needed. HOME  CARE INSTRUCTIONS If You Have a Splint or Brace:  Wear it as told by your health care provider. Remove it only as told by your health care provider.  Loosen the splint or brace if your fingers become numb and tingle, or if they turn cold and blue.  Keep the splint or brace clean and dry. Managing Pain, Stiffness, and Swelling   If directed, apply ice to the injured area.  Put ice in a plastic bag.  Place a towel between your skin and the bag.  Leave the ice on for 20 minutes,  2-3 times per day.  Move your fingers often to avoid stiffness and to lessen swelling.  Raise (elevate) the injured area above the level of your heart while you are sitting or lying down. General Instructions  Return to your normal activities as told by your health care provider. Ask your health care provider what activities are safe for you.  Take over-the-counter and prescription medicines only as told by your health care provider.  Keep all follow-up visits as told by your health care provider. This is important.  Do not drive or operate heavy machinery while taking prescription pain medicine. SEEK MEDICAL CARE IF:  Your pain, tenderness, or swelling gets worse, even if you have had treatment.  You have numbness or tingling in your wrist, hand, or fingers on the injured side.   This information is not intended to replace advice given to you by your health care provider. Make sure you discuss any questions you have with your health care provider.   Document Released: 06/22/2005 Document Revised: 03/13/2015 Document Reviewed: 08/28/2014 Elsevier Interactive Patient Education [employer].

## 2015-05-08 NOTE — Progress Notes (Signed)
Subjective:  Patient ID: Heidi [name], female    DOB: 10/16/1950  Age: 64 y.o. MRN: [id number]  CC: Hand Pain   HPI Brandyn Thien presents for    1. R wrist pain: x one year following fall where she landed on R side and used her hand to brace her fall. Worsening pain. Pain is radial and dorsal wrist. No swelling.   2. Med refills: patient requesting printed refills.   Social History  Substance Use Topics  . Smoking status: Never Smoker   . Smokeless tobacco: Not on file  . Alcohol Use: No    Outpatient Prescriptions Prior to Visit  Medication Sig Dispense Refill  . albuterol (PROVENTIL HFA;VENTOLIN HFA) 108 (90 BASE) MCG/ACT inhaler Inhale 2 puffs into the lungs every 6 (six) hours as needed for wheezing or shortness of breath. 1 Inhaler 3  . albuterol (PROVENTIL) (2.5 MG/3ML) 0.083% nebulizer solution Take 3 mLs (2.5 mg total) by nebulization every 6 (six) hours as needed for wheezing or shortness of breath. 150 mL 1  . aspirin 81 MG chewable tablet 81 mg daily.    Marland Kitchen atenolol (TENORMIN) 50 MG tablet Take 1 tablet (50 mg total) by mouth 2 (two) times daily. 60 tablet 5  . calcium-vitamin D (OSCAL WITH D) 500-200 MG-UNIT per tablet Take 1 tablet by mouth daily. 30 tablet 2  . Fluticasone-Salmeterol (ADVAIR DISKUS) 500-50 MCG/DOSE AEPB Inhale 1 puff into the lungs 2 (two) times daily. 60 each 5  . glucose blood (FREESTYLE TEST STRIPS) test strip Use as instructed 100 each 12  . guaifenesin (ROBITUSSIN) 100 MG/5ML syrup Take 200 mg by mouth 3 (three) times daily as needed for cough.    . hydrochlorothiazide (HYDRODIURIL) 25 MG tablet Take 1 tablet (25 mg total) by mouth daily. 30 tablet 5  . HYDROcodone-acetaminophen (NORCO) 7.5-325 MG per tablet Take 1 tablet by mouth every 6 (six) hours as needed for moderate pain or severe pain.   0  . insulin lispro protamine-lispro (HUMALOG MIX 75/25) (75-25) 100 UNIT/ML SUSP injection Inject 75 Units into the skin 3 (three) times daily with  meals. 70 mL 5  . Insulin Syringe-Needle U-100 (INSULIN SYRINGE 1CC/28G) 28G X 1/2" 1 ML MISC 1 each by Does not apply route 3 (three) times daily. 90 each 11  . Lancets (FREESTYLE) lancets Use as instructed 100 each 12  . lisinopril (PRINIVIL,ZESTRIL) 5 MG tablet Take 1 tablet (5 mg total) by mouth daily. 30 tablet 5  . loratadine (CLARITIN) 10 MG tablet Take 1 tablet (10 mg total) by mouth daily. 30 tablet 5  . montelukast (SINGULAIR) 10 MG tablet Take 1 tablet (10 mg total) by mouth daily. 30 tablet 1  . nitroGLYCERIN (NITROSTAT) 0.3 MG SL tablet Place 0.3 mg under the tongue every 5 (five) minutes as needed for chest pain.     Marland Kitchen omega-3 acid ethyl esters (LOVAZA) 1 G capsule Take 1,000 mg by mouth 2 (two) times daily.    Marland Kitchen omeprazole (PRILOSEC) 40 MG capsule Take 1 capsule (40 mg total) by mouth daily. 30 capsule 1  . potassium chloride (K-DUR) 10 MEQ tablet TAKE 1 TABLET(10 MEQ) BY MOUTH DAILY 90 tablet 2  . potassium chloride (K-DUR) 10 MEQ tablet TAKE 1 TABLET(10 MEQ) BY MOUTH DAILY (Patient not taking: Reported on 04/15/2015) 30 tablet 0  . topiramate (TOPAMAX) 50 MG tablet Take 50 mg by mouth 2 (two) times daily.  2   No facility-administered medications prior to visit.  ROS Review of Systems  Constitutional: Negative for fever and chills.  Eyes: Negative for visual disturbance.  Respiratory: Negative for shortness of breath.   Cardiovascular: Negative for chest pain.  Gastrointestinal: Negative for abdominal pain and blood in stool.  Musculoskeletal: Positive for arthralgias. Negative for back pain.  Skin: Negative for rash.  Allergic/Immunologic: Negative for immunocompromised state.  Hematological: Negative for adenopathy. Does not bruise/bleed easily.  Psychiatric/Behavioral: Negative for suicidal ideas and dysphoric mood.    Objective:  BP 122/73 mmHg  Pulse 67  Temp(Src) 98.2 F (36.8 C) (Oral)  Resp 16  Ht 5\' 3"  (1.6 m)  Wt 248 lb 3.2 oz (112.583 kg)  BMI 43.98  kg/m2  SpO2 94%  BP/Weight 05/08/2015 04/15/2015 80/03/9832  Systolic BP 825 053 976  Diastolic BP 73 69 53  Wt. (Lbs) 248.2 249 240  BMI 43.98 44.12 42.52   Physical Exam  Constitutional: She is oriented to person, place, and time. She appears well-developed and well-nourished. No distress.  HENT:  Head: Normocephalic and atraumatic.  Cardiovascular: Intact distal pulses.   Pulmonary/Chest: Effort normal.  Musculoskeletal: She exhibits no edema.       Right wrist: She exhibits tenderness. She exhibits normal range of motion, no bony tenderness, no swelling, no effusion, no crepitus, no deformity and no laceration.  + Finkelstein R wrist   Neurological: She is alert and oriented to person, place, and time.  Skin: Skin is warm and dry. No rash noted.  Psychiatric: She has a normal mood and affect.   Lab Results  Component Value Date   HGBA1C 10.90 02/11/2015   CBG 113   Assessment & Plan:   Problem List Items Addressed This Visit    Asthma, chronic (Chronic)   Relevant Medications   montelukast (SINGULAIR) 10 MG tablet   benzonatate (TESSALON) 100 MG capsule   De Quervain's tenosynovitis, right    Toradol shot, icing at home and bracing for wrist. Wear brace for 12 hrs a day.  Next step PT Final step referral to sports medicine for injection       Relevant Medications   ketorolac (TORADOL) 30 MG/ML injection 30 mg (Completed)   Other Relevant Orders   DG Wrist Complete Right   Diabetes type 2, uncontrolled (HCC) - Primary (Chronic)   Relevant Medications   lisinopril (PRINIVIL,ZESTRIL) 5 MG tablet   Other Relevant Orders   Glucose (CBG) (Completed)   HLD (hyperlipidemia) (Chronic)   Relevant Medications   hydrochlorothiazide (HYDRODIURIL) 25 MG tablet   omega-3 acid ethyl esters (LOVAZA) 1 G capsule   lisinopril (PRINIVIL,ZESTRIL) 5 MG tablet   HTN (hypertension) (Chronic)   Relevant Medications   hydrochlorothiazide (HYDRODIURIL) 25 MG tablet   omega-3 acid  ethyl esters (LOVAZA) 1 G capsule   lisinopril (PRINIVIL,ZESTRIL) 5 MG tablet   Renal cyst, right (Chronic)      No orders of the defined types were placed in this encounter.    Follow-up: No Follow-up on file.   Boykin Nearing MD

## 2015-05-08 NOTE — Progress Notes (Signed)
F/U rt wrist pain Pain scale # 9 No hx tobacco

## 2015-05-08 NOTE — Assessment & Plan Note (Signed)
Toradol shot, icing at home and bracing for wrist. Wear brace for 12 hrs a day.  Next step PT Final step referral to sports medicine for injection

## 2015-05-10 ENCOUNTER — Ambulatory Visit: Payer: Medicare Other | Attending: Family Medicine

## 2015-05-10 DIAGNOSIS — M25619 Stiffness of unspecified shoulder, not elsewhere classified: Secondary | ICD-10-CM | POA: Diagnosis not present

## 2015-05-10 DIAGNOSIS — M436 Torticollis: Secondary | ICD-10-CM | POA: Insufficient documentation

## 2015-05-10 DIAGNOSIS — R29898 Other symptoms and signs involving the musculoskeletal system: Secondary | ICD-10-CM | POA: Diagnosis not present

## 2015-05-10 DIAGNOSIS — M25561 Pain in right knee: Secondary | ICD-10-CM | POA: Diagnosis not present

## 2015-05-10 DIAGNOSIS — R293 Abnormal posture: Secondary | ICD-10-CM | POA: Diagnosis not present

## 2015-05-10 DIAGNOSIS — M25519 Pain in unspecified shoulder: Secondary | ICD-10-CM | POA: Diagnosis not present

## 2015-05-10 DIAGNOSIS — M25562 Pain in left knee: Secondary | ICD-10-CM | POA: Diagnosis not present

## 2015-05-10 DIAGNOSIS — M542 Cervicalgia: Secondary | ICD-10-CM | POA: Diagnosis not present

## 2015-05-10 DIAGNOSIS — M25669 Stiffness of unspecified knee, not elsewhere classified: Secondary | ICD-10-CM | POA: Insufficient documentation

## 2015-05-10 NOTE — Therapy (Signed)
[place], [place], [address] Phone: [phone]   Fax:  [phone]  Physical Therapy Treatment  Patient Details  Name: Heidi [name] MRN: [id number] Date of Birth: 01-May-1951 No Data Recorded  Encounter Date: 05/10/2015      PT End of Session - 05/10/15 1057    Visit Number 7   Number of Visits 19   Date for PT Re-Evaluation 05/31/15   PT Start Time 1020   PT Stop Time 1050   PT Time Calculation (min) 30 min   Activity Tolerance Patient tolerated treatment well   Behavior During Therapy [facility] for tasks assessed/performed      Past Medical History  Diagnosis Date  . Asthma   . Hypertension   . Diabetes mellitus without complication [facility])     Past Surgical History  Procedure Laterality Date  . Abdominal hysterectomy      There were no vitals filed for this visit.  Visit Diagnosis:  Neck and shoulder pain  Stiffness of neck      Subjective Assessment - 05/10/15 1053    Subjective Headaches have been worse and MD said I need to do less strenuous exercise. She was supposed to call you.    Pertinent History .    Currently in Pain? Yes   Pain Score --  Not taken as we began to have a discussion about pain and MD  was to call about changeing exercises.    Pain Location Neck   Pain Orientation Left   Pain Type Chronic pain   Pain Onset More than a month ago   Pain Frequency Constant   Aggravating Factors  Activity   Pain Relieving Factors meds   Multiple Pain Sites Yes                         [facility] Adult PT Treatment/Exercise - 05/10/15 1021    Neck Exercises: Seated   Other Seated Exercise pullys for 3 min   Manual Therapy   Soft tissue mobilization With [place] blade LT posterior lateral and anterior LT neck   She reported she felt tenderness was more wide spread                  PT Short Term Goals - 05/03/15 1013    PT SHORT TERM GOAL #1   Title  She will be independent with inital HEP   Status Achieved   PT SHORT TERM GOAL #2   Title She will improve active neck rotation to 45 degrees RT and LT    Status On-going   PT SHORT TERM GOAL #3   Title She will report neck pain decr 30% or more    Status On-going   PT SHORT TERM GOAL #4   Title She will demo understanding of good sitting psoture with support   Status On-going           PT Long Term Goals - 05/03/15 1014    PT LONG TERM GOAL #1   Title She will be independent with all HEP issued as of last visit.    PT LONG TERM GOAL #2   Title She will report pain decr 50% or more with normal activity and use of LT arm   Status On-going   PT LONG TERM GOAL #3   Title She ill improve neck rotation to 55 degrees to improve mobility and dcr pain.    Status On-going  PT LONG TERM GOAL #4   Title Her gait velocity will improve to walking 2 feet per second.    Status On-going   PT LONG TERM GOAL #5   Title She will report knee pain improved 50% with walking normal activity in and out of home   Status On-going               Plan - 05/10/15 1058    Clinical Impression Statement Heidi [name] did not want to exercise as Heidi [name] had not contacted me. She asked for use of soft tissue work with tool but may have just wanted to see if her tenderness was increased and she reported she was tender from SCM to paraspinals iin LT upper neck . Heidi [name] was called but she was busy and said she would call back later. Heidi [name]  did not appear any different than prior visits. I will hold on exercise and do STw and she will receive dry needle treatments in next 2 weeks and if no better then will discharge         Pt will benefit from skilled therapeutic intervention in order to improve on the following deficits Decreased knowledge of precautions   PT Next Visit Plan Manual and modalities with gentle stretching. Measure ROM neck and arms   Consulted and Agree with Plan of Care Patient         Problem List Patient Active Problem List   Diagnosis Date Noted  . De Quervain's tenosynovitis, right 05/08/2015  . Chronic headache 05/08/2015  . GERD (gastroesophageal reflux disease) 05/08/2015  . Renal cyst, right 05/08/2015  . Adenomatous polyp of colon 03/26/2015  . HTN (hypertension) 02/12/2015  . Cognitive deficit due to old head trauma 02/12/2015  . Asthma, chronic 02/12/2015  . Health care maintenance 02/11/2015  . Chronic left shoulder pain 02/11/2015  . Knee pain, bilateral 02/11/2015  . Thyroid nodule 12/18/2014  . Neck pain   . HLD (hyperlipidemia)   . OSA treated with BiPAP 11/27/2014  . Obesity hypoventilation syndrome ([place]) 11/27/2014  . Elevated troponin   . Demand ischemia ([place])   . Diabetes type 2, uncontrolled ([place])   . Cardiomegaly 11/25/2014  . CHF (congestive heart failure) ([place]) 11/25/2014  . 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 [place], [place], [address] Phone: [phone]   Fax:  [phone]  Name: Heidi [name] MRN: [id number] Date of Birth: 1951-04-25

## 2015-05-13 ENCOUNTER — Ambulatory Visit: Payer: Medicare Other

## 2015-05-13 DIAGNOSIS — M25619 Stiffness of unspecified shoulder, not elsewhere classified: Secondary | ICD-10-CM | POA: Diagnosis not present

## 2015-05-13 DIAGNOSIS — M25519 Pain in unspecified shoulder: Secondary | ICD-10-CM

## 2015-05-13 DIAGNOSIS — M542 Cervicalgia: Secondary | ICD-10-CM | POA: Diagnosis not present

## 2015-05-13 DIAGNOSIS — M436 Torticollis: Secondary | ICD-10-CM

## 2015-05-13 DIAGNOSIS — R293 Abnormal posture: Secondary | ICD-10-CM | POA: Diagnosis not present

## 2015-05-13 DIAGNOSIS — M25561 Pain in right knee: Secondary | ICD-10-CM | POA: Diagnosis not present

## 2015-05-13 NOTE — Therapy (Signed)
[place], [place], [address] Phone: [phone]   Fax:  [phone]  Physical Therapy Treatment  Patient Details  Name: Heidi [name] MRN: [id number] Date of Birth: 06-02-1951 No Data Recorded  Encounter Date: 05/13/2015      PT End of Session - 05/13/15 1040    Visit Number 8   Number of Visits 19   Date for PT Re-Evaluation 05/31/15   PT Start Time 5465   PT Stop Time 1050   PT Time Calculation (min) 35 min   Activity Tolerance Patient tolerated treatment well   Behavior During Therapy [facility] for tasks assessed/performed      Past Medical History  Diagnosis Date  . Asthma   . Hypertension   . Diabetes mellitus without complication [facility])     Past Surgical History  Procedure Laterality Date  . Abdominal hysterectomy      There were no vitals filed for this visit.  Visit Diagnosis:  Neck and shoulder pain  Stiffness of neck      Subjective Assessment - 05/13/15 1021    Subjective Doing Ok today   Currently in Pain? Yes   Pain Score 5    Pain Location Neck   Pain Orientation Right;Left   Pain Descriptors / Indicators Aching;Cramping   Pain Type Chronic pain   Pain Onset More than a month ago   Pain Frequency Constant   Aggravating Factors  Activity   Pain Relieving Factors meds                         [facility] Adult PT Treatment/Exercise - 05/13/15 1024    Moist Heat Therapy   Number Minutes Moist Heat 15 Minutes   Moist Heat Location Cervical   Manual Therapy   Soft tissue mobilization With [place] blade LT posterior lateral and anterior LT neck                   PT Short Term Goals - 05/03/15 1013    PT SHORT TERM GOAL #1   Title She will be independent with inital HEP   Status Achieved   PT SHORT TERM GOAL #2   Title She will improve active neck rotation to 45 degrees RT and LT    Status On-going   PT SHORT TERM GOAL #3   Title She will  report neck pain decr 30% or more    Status On-going   PT SHORT TERM GOAL #4   Title She will demo understanding of good sitting psoture with support   Status On-going           PT Long Term Goals - 05/03/15 1014    PT LONG TERM GOAL #1   Title She will be independent with all HEP issued as of last visit.    PT LONG TERM GOAL #2   Title She will report pain decr 50% or more with normal activity and use of LT arm   Status On-going   PT LONG TERM GOAL #3   Title She ill improve neck rotation to 55 degrees to improve mobility and dcr pain.    Status On-going   PT LONG TERM GOAL #4   Title Her gait velocity will improve to walking 2 feet per second.    Status On-going   PT LONG TERM GOAL #5   Title She will report knee pain improved 50% with walking normal activity in and out  of home   Status On-going               Plan - 05/13/15 1041    Clinical Impression Statement Limited to STW as Dr Adrian Blackwater had not been back in touch with me or the patient. She was able to ahve a phone conversation while I did the STW without issue   PT Next Visit Plan Manual and modalities with gentle stretching. Measure ROM neck and arms   Consulted and Agree with Plan of Care Patient        Problem List Patient Active Problem List   Diagnosis Date Noted  . De Quervain's tenosynovitis, right 05/08/2015  . Chronic headache 05/08/2015  . GERD (gastroesophageal reflux disease) 05/08/2015  . Renal cyst, right 05/08/2015  . Adenomatous polyp of colon 03/26/2015  . HTN (hypertension) 02/12/2015  . Cognitive deficit due to old head trauma 02/12/2015  . Asthma, chronic 02/12/2015  . Health care maintenance 02/11/2015  . Chronic left shoulder pain 02/11/2015  . Knee pain, bilateral 02/11/2015  . Thyroid nodule 12/18/2014  . Neck pain   . HLD (hyperlipidemia)   . OSA treated with BiPAP 11/27/2014  . Obesity hypoventilation syndrome ([place]) 11/27/2014  . Elevated troponin   . Demand ischemia  ([place])   . Diabetes type 2, uncontrolled ([place])   . Cardiomegaly 11/25/2014  . CHF (congestive heart failure) ([place]) 11/25/2014  . 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

## 2015-05-15 ENCOUNTER — Ambulatory Visit: Payer: Medicare Other | Admitting: Physical Therapy

## 2015-05-15 DIAGNOSIS — M25519 Pain in unspecified shoulder: Secondary | ICD-10-CM | POA: Diagnosis not present

## 2015-05-15 DIAGNOSIS — M25619 Stiffness of unspecified shoulder, not elsewhere classified: Secondary | ICD-10-CM

## 2015-05-15 DIAGNOSIS — M542 Cervicalgia: Principal | ICD-10-CM

## 2015-05-15 DIAGNOSIS — M436 Torticollis: Secondary | ICD-10-CM | POA: Diagnosis not present

## 2015-05-15 DIAGNOSIS — R293 Abnormal posture: Secondary | ICD-10-CM | POA: Diagnosis not present

## 2015-05-15 DIAGNOSIS — M25561 Pain in right knee: Secondary | ICD-10-CM | POA: Diagnosis not present

## 2015-05-15 NOTE — Patient Instructions (Signed)

## 2015-05-15 NOTE — Therapy (Signed)
[place], [place], [address] Phone: [phone]   Fax:  [phone]  Physical Therapy Treatment  Patient Details  Name: Heidi [name] MRN: [id number] Date of Birth: 08/27/1950 No Data Recorded  Encounter Date: 05/15/2015      PT End of Session - 05/15/15 1057    Visit Number 9   Number of Visits 19   Date for PT Re-Evaluation 05/31/15   Authorization Type Medicare KX at visit 15   Authorization - Visit Number 1   Authorization - Number of Visits 19   PT Start Time 1015   PT Stop Time 1105   PT Time Calculation (min) 50 min   Activity Tolerance Patient tolerated treatment well   Behavior During Therapy [facility] for tasks assessed/performed      Past Medical History  Diagnosis Date  . Asthma   . Hypertension   . Diabetes mellitus without complication [facility])     Past Surgical History  Procedure Laterality Date  . Abdominal hysterectomy      There were no vitals filed for this visit.  Visit Diagnosis:  Neck and shoulder pain  Stiffness of neck  Stiffness of shoulder joint, unspecified laterality  Abnormal posture      Subjective Assessment - 05/15/15 1023    Subjective pt states she has been having knee pain that is worse with the biking and causes her back and headaches to start"   Currently in Pain? Yes   Pain Score 3    Pain Location Neck   Pain Orientation Right;Left   Pain Descriptors / Indicators Aching;Cramping   Pain Type Chronic pain   Pain Onset More than a month ago   Pain Frequency Constant   Aggravating Factors  any activity   Pain Relieving Factors meds   Pain Score 2   Pain Location Knee   Pain Orientation Right;Left   Pain Descriptors / Indicators Aching   Pain Type Chronic pain   Pain Onset More than a month ago   Pain Frequency Constant   Aggravating Factors  activity and movement in                          [facility] Adult PT Treatment/Exercise -  05/15/15 0001    Self-Care   Self-Care Other Self-Care Comments   Other Self-Care Comments  discussed importance of doing exercises to calm down tightness and pain   Neck Exercises: Supine   Neck Retraction 10 reps   Capital Flexion 10 reps   Lateral Flexion Right;Left;10 reps   Lateral Flexion Limitations manual assist   Moist Heat Therapy   Number Minutes Moist Heat 10 Minutes   Moist Heat Location Cervical  in sitting   Manual Therapy   Joint Mobilization supine cervical grade 2 P>A to decrease pain    Soft tissue mobilization instrument assisted STM over bil upper trpas and levator scap                PT Education - 05/15/15 1056    Education provided Yes   Education Details dry needling education   Person(s) Educated Patient   Methods Explanation;Handout   Comprehension Verbalized understanding          PT Short Term Goals - 05/15/15 1101    PT SHORT TERM GOAL #1   Title She will be independent with inital HEP   Time 2   Period Weeks   Status Achieved  PT SHORT TERM GOAL #2   Title She will improve active neck rotation to 45 degrees RT and LT    Time 3   Period Weeks   Status On-going   PT SHORT TERM GOAL #3   Title She will report neck pain decr 30% or more    Time 3   Period Weeks   Status On-going   PT SHORT TERM GOAL #4   Title She will demo understanding of good sitting psoture with support   Time 3   Period Weeks   Status On-going           PT Long Term Goals - 05/15/15 1101    PT LONG TERM GOAL #1   Title She will be independent with all HEP issued as of last visit.    Time 10   Period Weeks   Status On-going   PT LONG TERM GOAL #2   Title She will report pain decr 50% or more with normal activity and use of LT arm   Time 10   Period Weeks   Status On-going   PT LONG TERM GOAL #3   Title She ill improve neck rotation to 55 degrees to improve mobility and dcr pain.    Time 10   Period Weeks   Status On-going   PT LONG TERM  GOAL #4   Title Her gait velocity will improve to walking 2 feet per second.    Time 10   Period Weeks   Status On-going   PT LONG TERM GOAL #5   Title She will report knee pain improved 50% with walking normal activity in and out of home   Time 10   Period Weeks   Status On-going               Plan - 05/15/15 1057    Clinical Impression Statement pt reported that she had decreased pain today when she walked in but reported the pain increased to a 6/10 in the neck while sitting and discussing todays treatment. Educated about dry neelding and she provided consent for dry needling of the upper traps, mulitple twitches were noted and pt reported some decrease in tension. performed instrument assisted STM and heat following to decrease any additional sorenss.    PT Next Visit Plan assess response to dry needling, Manual and modalities with gentle stretching. Measure ROM neck and arms   PT Home Exercise Plan dry needling education and handout   Consulted and Agree with Plan of Care Patient        Problem List Patient Active Problem List   Diagnosis Date Noted  . De Quervain's tenosynovitis, right 05/08/2015  . Chronic headache 05/08/2015  . GERD (gastroesophageal reflux disease) 05/08/2015  . Renal cyst, right 05/08/2015  . Adenomatous polyp of colon 03/26/2015  . HTN (hypertension) 02/12/2015  . Cognitive deficit due to old head trauma 02/12/2015  . Asthma, chronic 02/12/2015  . Health care maintenance 02/11/2015  . Chronic left shoulder pain 02/11/2015  . Knee pain, bilateral 02/11/2015  . Thyroid nodule 12/18/2014  . Neck pain   . HLD (hyperlipidemia)   . OSA treated with BiPAP 11/27/2014  . Obesity hypoventilation syndrome ([place]) 11/27/2014  . Elevated troponin   . Demand ischemia ([place])   . Diabetes type 2, uncontrolled ([place])   . Cardiomegaly 11/25/2014  . CHF (congestive heart failure) ([place]) 11/25/2014  . DM type 2 causing CKD stage 2 ([place]) 11/25/2014    [name] PT, DPT,  LAT, ATC  05/15/2015  11:15 AM    [facility] [address] [place], [place], [address] Phone: [phone]   Fax:  [phone]  Name: Heidi [name] MRN: [id number] Date of Birth: April 06, 1951

## 2015-05-20 ENCOUNTER — Ambulatory Visit: Payer: Medicare Other

## 2015-05-20 DIAGNOSIS — R293 Abnormal posture: Secondary | ICD-10-CM

## 2015-05-20 DIAGNOSIS — M542 Cervicalgia: Secondary | ICD-10-CM | POA: Diagnosis not present

## 2015-05-20 DIAGNOSIS — M25519 Pain in unspecified shoulder: Secondary | ICD-10-CM | POA: Diagnosis not present

## 2015-05-20 DIAGNOSIS — M25561 Pain in right knee: Secondary | ICD-10-CM | POA: Diagnosis not present

## 2015-05-20 DIAGNOSIS — M25619 Stiffness of unspecified shoulder, not elsewhere classified: Secondary | ICD-10-CM | POA: Diagnosis not present

## 2015-05-20 DIAGNOSIS — M436 Torticollis: Secondary | ICD-10-CM

## 2015-05-20 NOTE — Therapy (Signed)
[place], [place], [address] Phone: [phone]   Fax:  [phone]  Physical Therapy Treatment  Patient Details  Name: Heidi [name] MRN: [id number] Date of Birth: 1950-07-22 No Data Recorded  Encounter Date: 05/20/2015      PT End of Session - 05/20/15 1136    Visit Number 10   Number of Visits 19   Date for PT Re-Evaluation 05/31/15   PT Start Time 1100   PT Stop Time 1145   PT Time Calculation (min) 45 min   Activity Tolerance Patient tolerated treatment well;Patient limited by pain   Behavior During Therapy [facility] for tasks assessed/performed      Past Medical History  Diagnosis Date  . Asthma   . Hypertension   . Diabetes mellitus without complication [facility])     Past Surgical History  Procedure Laterality Date  . Abdominal hysterectomy      There were no vitals filed for this visit.  Visit Diagnosis:  Neck and shoulder pain - Plan: PT plan of care cert/re-cert  Stiffness of neck - Plan: PT plan of care cert/re-cert  Abnormal posture - Plan: PT plan of care cert/re-cert      Subjective Assessment - 05/20/15 1109    Subjective She reports after last visit she reports no changes  with needling. she reports excessive talking causes increased pain ( She talked for n30 min 2 visits ago and did not report increased pain)   Currently in Pain? Yes   Pain Score 7    Pain Location Back  and neck   Pain Orientation Left  more than RT   Pain Descriptors / Indicators Aching;Cramping   Pain Type Chronic pain   Pain Onset More than a month ago   Aggravating Factors  anything   Pain Relieving Factors meds   Multiple Pain Sites --  and knees                         [facility] Adult PT Treatment/Exercise - 05/20/15 0001    Moist Heat Therapy   Number Minutes Moist Heat 15 Minutes   Moist Heat Location Cervical   Manual Therapy   Soft tissue mobilization instrument assisted STM  over bil upper trpas and levator scap   Myofascial Release suboccipital release and STW to upper cervical spine.  in sitting with tool She wa sensitive in upper cervical area   Passive ROM Neck rotation , sidebending , levator and pectorals x 2 each 20 sec. Marland Kitchen                   PT Short Term Goals - 05/20/15 1138    PT SHORT TERM GOAL #1   Title She will be independent with inital HEP   Status Achieved   PT SHORT TERM GOAL #2   Title She will improve active neck rotation to 45 degrees RT and LT    Status On-going   PT SHORT TERM GOAL #3   Title She will report neck pain decr 30% or more    Status On-going   PT SHORT TERM GOAL #4   Title She will demo understanding of good sitting posture with support   Baseline She does not corect without cues   Status On-going           PT Long Term Goals - 05/15/15 1101    PT LONG TERM GOAL #1   Title She will be  independent with all HEP issued as of last visit.    Time 10   Period Weeks   Status On-going   PT LONG TERM GOAL #2   Title She will report pain decr 50% or more with normal activity and use of LT arm   Time 10   Period Weeks   Status On-going   PT LONG TERM GOAL #3   Title She ill improve neck rotation to 55 degrees to improve mobility and dcr pain.    Time 10   Period Weeks   Status On-going   PT LONG TERM GOAL #4   Title Her gait velocity will improve to walking 2 feet per second.    Time 10   Period Weeks   Status On-going   PT LONG TERM GOAL #5   Title She will report knee pain improved 50% with walking normal activity in and out of home   Time 10   Period Weeks   Status On-going               Plan - 05/20/15 1136    Clinical Impression Statement At visit 10 she is no better and now most of the neck pain is on the RT as it was more on LT when she started. She recieved one treatment of dry needling without benefit but we will schedule total of 4 visits of this to see if she improves . If not we  will discharge her.    PT Next Visit Plan Continue dry needling x 3 and assess progress   Consulted and Agree with Plan of Care Patient        Problem List Patient Active Problem List   Diagnosis Date Noted  . De Quervain's tenosynovitis, right 05/08/2015  . Chronic headache 05/08/2015  . GERD (gastroesophageal reflux disease) 05/08/2015  . Renal cyst, right 05/08/2015  . Adenomatous polyp of colon 03/26/2015  . HTN (hypertension) 02/12/2015  . Cognitive deficit due to old head trauma 02/12/2015  . Asthma, chronic 02/12/2015  . Health care maintenance 02/11/2015  . Chronic left shoulder pain 02/11/2015  . Knee pain, bilateral 02/11/2015  . Thyroid nodule 12/18/2014  . Neck pain   . HLD (hyperlipidemia)   . OSA treated with BiPAP 11/27/2014  . Obesity hypoventilation syndrome ([place]) 11/27/2014  . Elevated troponin   . Demand ischemia ([place])   . Diabetes type 2, uncontrolled ([place])   . Cardiomegaly 11/25/2014  . CHF (congestive heart failure) ([place]) 11/25/2014  . DM type 2 causing CKD stage 2 ([place]) 11/25/2014    Darrel Hoover PT 05/20/2015, 11:40 AM  [facility] [address] [place], [place], [address] Phone: [phone]   Fax:  [phone]  Name: Heidi [name] MRN: [id number] Date of Birth: Aug 03, 1950

## 2015-05-22 ENCOUNTER — Ambulatory Visit: Payer: Medicare Other | Admitting: Physical Therapy

## 2015-05-22 DIAGNOSIS — M436 Torticollis: Secondary | ICD-10-CM | POA: Diagnosis not present

## 2015-05-22 DIAGNOSIS — R29898 Other symptoms and signs involving the musculoskeletal system: Secondary | ICD-10-CM

## 2015-05-22 DIAGNOSIS — M25519 Pain in unspecified shoulder: Secondary | ICD-10-CM | POA: Diagnosis not present

## 2015-05-22 DIAGNOSIS — M25619 Stiffness of unspecified shoulder, not elsewhere classified: Secondary | ICD-10-CM

## 2015-05-22 DIAGNOSIS — M25561 Pain in right knee: Secondary | ICD-10-CM | POA: Diagnosis not present

## 2015-05-22 DIAGNOSIS — M25669 Stiffness of unspecified knee, not elsewhere classified: Secondary | ICD-10-CM

## 2015-05-22 DIAGNOSIS — M25562 Pain in left knee: Secondary | ICD-10-CM

## 2015-05-22 DIAGNOSIS — M542 Cervicalgia: Principal | ICD-10-CM

## 2015-05-22 DIAGNOSIS — R293 Abnormal posture: Secondary | ICD-10-CM | POA: Diagnosis not present

## 2015-05-22 NOTE — Therapy (Addendum)
[facility] [address] [place], [place], [address] Phone: [phone]   Fax:  [phone]  Physical Therapy Treatment/ discharge note  Patient Details  Name: Heidi [name] MRN: [id number] Date of Birth: 03/07/1951 No Data Recorded  Encounter Date: 05/22/2015      PT End of Session - 05/22/15 1142    Visit Number 11   Number of Visits 19   Date for PT Re-Evaluation 05/31/15   Authorization Type Medicare KX at visit 15   Authorization - Visit Number 1   Authorization - Number of Visits 19   PT Start Time 1100   PT Stop Time 1155   PT Time Calculation (min) 55 min   Activity Tolerance Patient tolerated treatment well   Behavior During Therapy [facility] for tasks assessed/performed      Past Medical History  Diagnosis Date  . Asthma   . Hypertension   . Diabetes mellitus without complication ([facility])     Past Surgical History  Procedure Laterality Date  . Abdominal hysterectomy      There were no vitals filed for this visit.  Visit Diagnosis:  Neck and shoulder pain  Stiffness of neck  Abnormal posture  Stiffness of shoulder joint, unspecified laterality  Arthralgia of both knees  Weakness of both legs  Stiffness of knee joint, unspecified laterality      Subjective Assessment - 05/22/15 1108    Subjective "I am having a rough day, my back is getting worse and stuff"    Currently in Pain? Yes   Pain Score 7    Pain Location Back   Pain Orientation Right;Mid   Pain Descriptors / Indicators Aching;Sore   Pain Type Chronic pain   Pain Onset More than a month ago   Aggravating Factors  anything   Pain Relieving Factors meds   Pain Score 5   Pain Location Knee   Pain Orientation Right;Left   Pain Descriptors / Indicators Sore   Pain Type Chronic pain   Pain Onset More than a month ago   Pain Frequency Constant   Aggravating Factors  walking/ standing   Pain Relieving Factors meds, heat                          [facility] Adult PT Treatment/Exercise - 05/22/15 1111    Neck Exercises: Supine   Neck Retraction 10 reps   Capital Flexion 10 reps   Lateral Flexion Right;Left;10 reps   Moist Heat Therapy   Number Minutes Moist Heat 10 Minutes   Moist Heat Location Cervical  in sitting   Electrical Stimulation   Electrical Stimulation Location bil upper traps   Electrical Stimulation Action Pre mod   Electrical Stimulation Parameters L9 x 10 min   Electrical Stimulation Goals Pain   Manual Therapy   Soft tissue mobilization instrument assisted STM over bil upper trpas and levator scap   Myofascial Release suboccipital release and STW to upper cervical spine.  in sitting with tool She wa sensitive in upper cervical area   Passive ROM Neck rotation , sidebending , levator and pectorals x 2 each 20 sec. .    Neck Exercises: Stretches   Upper Trapezius Stretch 2 reps;30 seconds                PT Education - 05/22/15 1141    Education provided Yes   Education Details education of E-stim   Person(s) Educated Patient   Methods Explanation     Comprehension Verbalized understanding          PT Short Term Goals - 05/20/15 1138    PT SHORT TERM GOAL #1   Title She will be independent with inital HEP   Status Achieved   PT SHORT TERM GOAL #2   Title She will improve active neck rotation to 45 degrees RT and LT    Status On-going   PT SHORT TERM GOAL #3   Title She will report neck pain decr 30% or more    Status On-going   PT SHORT TERM GOAL #4   Title She will demo understanding of good sitting posture with support   Baseline She does not corect without cues   Status On-going           PT Long Term Goals - 05/15/15 1101    PT LONG TERM GOAL #1   Title She will be independent with all HEP issued as of last visit.    Time 10   Period Weeks   Status On-going   PT LONG TERM GOAL #2   Title She will report pain decr 50% or more with normal  activity and use of LT arm   Time 10   Period Weeks   Status On-going   PT LONG TERM GOAL #3   Title She ill improve neck rotation to 55 degrees to improve mobility and dcr pain.    Time 10   Period Weeks   Status On-going   PT LONG TERM GOAL #4   Title Her gait velocity will improve to walking 2 feet per second.    Time 10   Period Weeks   Status On-going   PT LONG TERM GOAL #5   Title She will report knee pain improved 50% with walking normal activity in and out of home   Time 10   Period Weeks   Status On-going               Plan - 05/22/15 1142    Clinical Impression Statement Heidi [name] opted not to do the dry needling. Focused  on manual trigger point release and sub-occipital release of pain and tightness which she reported relief with. she was able to complete all exercises without complaint of increased pain. opted to do E-stime and heat to calm down any additional pain, and muscle tightness.    PT Next Visit Plan Continue dry needling x 3 and assess progress   Consulted and Agree with Plan of Care Patient      G-Code: carrying, moving, and handling items.  48% limited FOTO  Current: CK Goal: CJ  Problem List Patient Active Problem List   Diagnosis Date Noted  . De Quervain's tenosynovitis, right 05/08/2015  . Chronic headache 05/08/2015  . GERD (gastroesophageal reflux disease) 05/08/2015  . Renal cyst, right 05/08/2015  . Adenomatous polyp of colon 03/26/2015  . HTN (hypertension) 02/12/2015  . Cognitive deficit due to old head trauma 02/12/2015  . Asthma, chronic 02/12/2015  . Health care maintenance 02/11/2015  . Chronic left shoulder pain 02/11/2015  . Knee pain, bilateral 02/11/2015  . Thyroid nodule 12/18/2014  . Neck pain   . HLD (hyperlipidemia)   . OSA treated with BiPAP 11/27/2014  . Obesity hypoventilation syndrome ([facility]) 11/27/2014  . Elevated troponin   . Demand ischemia ([facility])   . Diabetes type 2, uncontrolled ([facility])   .  Cardiomegaly 11/25/2014  . CHF (congestive heart failure) ([facility]) 11/25/2014  . DM type 2 causing   CKD stage 2 ([place]) 11/25/2014   Heidi [name] PT, DPT, LAT, ATC  05/22/2015  11:55 AM     [place] [facility] [address] [place], [place], [address] Phone: [phone]   Fax:  [phone]  Name: Heidi [name] MRN: [id number] Date of Birth: June 06, 1951    PHYSICAL THERAPY DISCHARGE SUMMARY  Visits from Start of Care: 11  Current functional level related to goals / functional outcomes: See goals   Remaining deficits: Unknown due to pt not returning since her last scheduled visit.    Education / Equipment: HEP  Plan:                                                    Patient goals were not met. Patient is being discharged due to not returning since the last visit.  ?????        Heidi [name] PT, DPT, LAT, ATC  06/26/2015  10:22 AM

## 2015-05-24 ENCOUNTER — Encounter: Payer: Self-pay | Admitting: Internal Medicine

## 2015-05-27 ENCOUNTER — Telehealth: Payer: Self-pay | Admitting: *Deleted

## 2015-05-27 NOTE — Telephone Encounter (Signed)
Pt stated do not want physical therapy  Due loss of feeling on rt side and lt side after therapy  Pt unable to tolerated, pain worsen  Refused water therapy due to Hx of pneumonia and cold weather

## 2015-06-04 ENCOUNTER — Other Ambulatory Visit: Payer: Self-pay | Admitting: *Deleted

## 2015-06-04 MED ORDER — LIDOCAINE 5 % EX OINT: 1.0000 "application " | TOPICAL_OINTMENT | Freq: Four times a day (QID) | CUTANEOUS | Status: DC | PRN

## 2015-06-10 ENCOUNTER — Telehealth: Payer: Self-pay | Admitting: *Deleted

## 2015-06-10 ENCOUNTER — Encounter: Payer: Medicare Other | Admitting: Physical Therapy

## 2015-06-10 DIAGNOSIS — G8929 Other chronic pain: Secondary | ICD-10-CM | POA: Diagnosis not present

## 2015-06-10 DIAGNOSIS — M5137 Other intervertebral disc degeneration, lumbosacral region: Secondary | ICD-10-CM | POA: Diagnosis not present

## 2015-06-10 NOTE — Telephone Encounter (Signed)
LVM to return call   Called pt with information about medication.  Unable to contact pt. no information given.  Awaiting for return call. Insurance not longer pay for Rx Novolog mix 70/70 Will change Rx Humalog Mix 75/25 if pt is comfortable with changes.

## 2015-06-12 ENCOUNTER — Ambulatory Visit (HOSPITAL_COMMUNITY)
Admission: RE | Admit: 2015-06-12 | Discharge: 2015-06-12 | Disposition: A | Discharge status: Home / Self Care | Payer: Medicare Other | Source: Ambulatory Visit | Attending: Family Medicine | Admitting: Family Medicine

## 2015-06-12 DIAGNOSIS — M25531 Pain in right wrist: Secondary | ICD-10-CM | POA: Insufficient documentation

## 2015-06-12 DIAGNOSIS — M654 Radial styloid tenosynovitis [de Quervain]: Secondary | ICD-10-CM | POA: Diagnosis not present

## 2015-06-12 NOTE — Telephone Encounter (Signed)
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

## 2015-06-12 NOTE — Telephone Encounter (Signed)
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

## 2015-06-13 ENCOUNTER — Other Ambulatory Visit: Payer: Self-pay | Admitting: Family Medicine

## 2015-06-17 ENCOUNTER — Telehealth: Payer: Self-pay | Admitting: Family Medicine

## 2015-06-17 ENCOUNTER — Other Ambulatory Visit: Payer: Self-pay | Admitting: Family Medicine

## 2015-06-17 ENCOUNTER — Other Ambulatory Visit: Payer: Self-pay | Admitting: *Deleted

## 2015-06-17 MED ORDER — OMEPRAZOLE 40 MG PO CPDR: 40.0000 mg | DELAYED_RELEASE_CAPSULE | Freq: Every day | ORAL | Status: DC

## 2015-06-17 NOTE — Telephone Encounter (Signed)
Rx refill send 

## 2015-06-17 NOTE — Telephone Encounter (Signed)
Patient called requesting a medication refill for Omeprozale, Patient would like the prescription to be sent to the [facility] on [address]. Please follow up.

## 2015-06-26 ENCOUNTER — Telehealth: Payer: Self-pay | Admitting: Family Medicine

## 2015-06-26 NOTE — Telephone Encounter (Signed)
Patient is asking for her paperwork from the orthopaedics office (Dr Lorre Nick). Paperwork was directed to Dr. Adrian Blackwater and placed in her box by me. Patient came by today and a few days ago requesting paperwork. Pt mentioned that the paperwork was faxed to [place] with the intention to be picked up by her. I advised patient about mychart and sent her an email, and that it will be scanned in her chart within the next week or two. Please follow up with pt if she is able to pick up this paperwork. Thank you.

## 2015-06-27 NOTE — Telephone Encounter (Signed)
Pt was advised.

## 2015-07-02 ENCOUNTER — Telehealth: Payer: Self-pay | Admitting: Family Medicine

## 2015-07-02 NOTE — Telephone Encounter (Signed)
Pt. Called requesting to speak to nurse regarding a Rx that was giving to her for her wrist. Pt. Stated she lost the Rx and would like a new one. Please f/u with pt.

## 2015-07-02 NOTE — Telephone Encounter (Signed)
We released patient medical records to the pain clinic on 06/17/15 and then again to the patient on 06/25/15. Patient reviewed her records and said that on page 25 it mentions that her fall happened on 12/23/13 but she said this is incorrect and that it really happened on 01/28/14. She also mentioned that it is important this be corrected because she has an open case with her attorney. Please follow up with patient if this can be corrected and resent to her pain clinic. Thank you.

## 2015-07-03 NOTE — Telephone Encounter (Signed)
Rx for wrist brace written

## 2015-07-03 NOTE — Telephone Encounter (Signed)
LVM Rx at front office ready to be pickup 

## 2015-07-04 ENCOUNTER — Telehealth: Payer: Self-pay | Admitting: Family Medicine

## 2015-07-04 NOTE — Telephone Encounter (Signed)
Pt. Called requesting a refill on a antibiotic that was giving to her for her cold. Pt. Does not remember the name of the antibiotic. Please f/u with pt.

## 2015-07-04 NOTE — Telephone Encounter (Signed)
Patient called stating that she is experiencing a runny nose and productive cough. Patient is experiencing a fever of 101 degrees. Patient states that she's ben taking tylenol. When patient coughs she states she feels like she has to throw up. Been coughing green mucus. Patient would like advice on what to do. Patient would like to be prescribed medicine for this.

## 2015-07-04 NOTE — Telephone Encounter (Signed)
Patient would like a phone call from the doctor or the nurse. Please follow up with patient.

## 2015-07-05 DIAGNOSIS — G43709 Chronic migraine without aura, not intractable, without status migrainosus: Secondary | ICD-10-CM | POA: Diagnosis not present

## 2015-07-10 ENCOUNTER — Ambulatory Visit: Payer: Medicare Other | Attending: Family Medicine | Admitting: Family Medicine

## 2015-07-10 ENCOUNTER — Other Ambulatory Visit: Payer: Self-pay | Admitting: Family Medicine

## 2015-07-10 ENCOUNTER — Encounter: Payer: Self-pay | Admitting: Family Medicine

## 2015-07-10 VITALS — BP 109/67 | HR 79 | Temp 99.6°F | Resp 15 | Ht 63.0 in | Wt 241.0 lb

## 2015-07-10 DIAGNOSIS — N182 Chronic kidney disease, stage 2 (mild): Secondary | ICD-10-CM | POA: Diagnosis not present

## 2015-07-10 DIAGNOSIS — E1122 Type 2 diabetes mellitus with diabetic chronic kidney disease: Secondary | ICD-10-CM | POA: Diagnosis not present

## 2015-07-10 DIAGNOSIS — J0120 Acute ethmoidal sinusitis, unspecified: Secondary | ICD-10-CM

## 2015-07-10 LAB — POCT GLYCOSYLATED HEMOGLOBIN (HGB A1C): Hemoglobin A1C: 12.5

## 2015-07-10 LAB — GLUCOSE, POCT (MANUAL RESULT ENTRY): POC Glucose: 59 mg/dl — AB (ref 70–99)

## 2015-07-10 MED ORDER — AZITHROMYCIN 250 MG PO TABS: ORAL_TABLET | ORAL | Status: DC

## 2015-07-10 NOTE — Progress Notes (Signed)
Cough with white colored sputum since last Thursday Patient reports fever and general body ache She reports wearing 2 L o2 prn at home-she is not wearing today in clinic Patient's CBG 59-she was given 4 oz coke and peanut butter crackers

## 2015-07-10 NOTE — Progress Notes (Signed)
Subjective:  Patient ID: Heidi [name], female    DOB: [birth year]  Age: 65 y.o. MRN: [id number]  CC: URI   HPI Rahmah Win is a 65 year old female with a history of uncontrolled type 2 diabetes mellitus (A1c 12.5), CHF, obesity hypoventilation syndrome, hypertension who comes into the clinic for an acute visit complaining of a one and a half week history of nasal congestion, cough productive of whitish sputum, postnasal drip which has not been responding to Mucinex. She has also been using her nebulizer treatment more often than usual. Oxygen saturation is 93 in the clinic and she is on home oxygen at night. Denies fevers or sinus tenderness or worsening shortness of breath.  Outpatient Prescriptions Prior to Visit  Medication Sig Dispense Refill  . albuterol (PROVENTIL HFA;VENTOLIN HFA) 108 (90 BASE) MCG/ACT inhaler Inhale 2 puffs into the lungs every 6 (six) hours as needed for wheezing or shortness of breath. 1 Inhaler 3  . albuterol (PROVENTIL) (2.5 MG/3ML) 0.083% nebulizer solution Take 3 mLs (2.5 mg total) by nebulization every 6 (six) hours as needed for wheezing or shortness of breath. 150 mL 1  . aspirin 81 MG chewable tablet 81 mg daily.    Marland Kitchen atenolol (TENORMIN) 50 MG tablet Take 1 tablet (50 mg total) by mouth 2 (two) times daily. 60 tablet 5  . calcium-vitamin D (OSCAL WITH D) 500-200 MG-UNIT per tablet Take 1 tablet by mouth daily. 30 tablet 2  . dextromethorphan-guaiFENesin (MUCINEX DM) 30-600 MG 12hr tablet Take 1 tablet by mouth 2 (two) times daily as needed for cough.    . Fluticasone-Salmeterol (ADVAIR DISKUS) 500-50 MCG/DOSE AEPB Inhale 1 puff into the lungs 2 (two) times daily. 60 each 5  . glucose blood (FREESTYLE TEST STRIPS) test strip Use as instructed 100 each 12  . hydrochlorothiazide (HYDRODIURIL) 25 MG tablet Take 1 tablet (25 mg total) by mouth daily. 90 tablet 3  . HYDROcodone-acetaminophen (NORCO) 7.5-325 MG per tablet Take 1 tablet by mouth every 6 (six)  hours as needed for moderate pain or severe pain.   0  . insulin lispro protamine-lispro (HUMALOG MIX 75/25) (75-25) 100 UNIT/ML SUSP injection Inject 75 Units into the skin 3 (three) times daily with meals. 70 mL 5  . Insulin Syringe-Needle U-100 (INSULIN SYRINGE 1CC/28G) 28G X 1/2" 1 ML MISC 1 each by Does not apply route 3 (three) times daily. 90 each 11  . Lancets (FREESTYLE) lancets Use as instructed 100 each 12  . lidocaine (XYLOCAINE) 5 % ointment Apply 1 application topically 4 (four) times daily as needed. 744.24 g 3  . lisinopril (PRINIVIL,ZESTRIL) 5 MG tablet Take 1 tablet (5 mg total) by mouth daily. 90 tablet 3  . montelukast (SINGULAIR) 10 MG tablet Take 1 tablet (10 mg total) by mouth daily. 90 tablet 3  . nitroGLYCERIN (NITROSTAT) 0.3 MG SL tablet Place 0.3 mg under the tongue every 5 (five) minutes as needed for chest pain.     Marland Kitchen omega-3 acid ethyl esters (LOVAZA) 1 G capsule Take 1 capsule (1,000 mg total) by mouth 2 (two) times daily. 180 capsule 3  . omeprazole (PRILOSEC) 40 MG capsule Take 1 capsule (40 mg total) by mouth daily. 30 capsule 1  . potassium chloride (K-DUR) 10 MEQ tablet TAKE 1 TABLET(10 MEQ) BY MOUTH DAILY 90 tablet 2  . topiramate (TOPAMAX) 50 MG tablet Take 50 mg by mouth 2 (two) times daily.  2  . benzonatate (TESSALON) 100 MG capsule Take 1 capsule (100 mg total)  by mouth 2 (two) times daily as needed for cough. (Patient not taking: Reported on 07/10/2015) 30 capsule 3  . loratadine (CLARITIN) 10 MG tablet Take 1 tablet (10 mg total) by mouth daily. (Patient not taking: Reported on 07/10/2015) 30 tablet 5   No facility-administered medications prior to visit.    ROS Review of Systems  Constitutional: Negative for activity change and appetite change.  HENT: Positive for sinus pressure.        See history of present illness  Respiratory: Negative for chest tightness, shortness of breath and wheezing.   Cardiovascular: Negative for chest pain and palpitations.   Gastrointestinal: Negative for abdominal pain, constipation and abdominal distention.  Genitourinary: Negative.   Musculoskeletal: Negative.   Psychiatric/Behavioral: Negative for behavioral problems and dysphoric mood.    Objective:  BP 109/67 mmHg  Pulse 79  Temp(Src) 99.6 F (37.6 C)  Resp 15  Ht 5\' 3"  (1.6 m)  Wt 241 lb (109.317 kg)  BMI 42.70 kg/m2  SpO2 93%  BP/Weight 07/10/2015 05/08/2015 123XX123  Systolic BP 0000000 [id number] [id number]  Diastolic BP 67 73 69  Wt. (Lbs) 241 248.2 249  BMI 42.7 43.98 44.12      Physical Exam  Constitutional: She is oriented to person, place, and time. She appears well-developed and well-nourished.  Cardiovascular: Normal rate, normal heart sounds and intact distal pulses.   No murmur heard. Pulmonary/Chest: Effort normal and breath sounds normal. She has no wheezes. She has no rales. She exhibits no tenderness.  Abdominal: Soft. Bowel sounds are normal. She exhibits no distension and no mass. There is no tenderness.  Musculoskeletal: Normal range of motion.  Neurological: She is alert and oriented to person, place, and time.     Assessment & Plan:   1. Type 2 diabetes mellitus with stage 2 chronic kidney disease, unspecified long term insulin use status ([place]) Blood sugar is 59 in the clinic and patient received peanut butter crackers and coke. I will hold off on making regimen changes even though her A1c is 12.5 and will defer that to her PCP - Glucose (CBG) - HgB A1c  2. Acute ethmoidal sinusitis, recurrence not specified Treated. Oxygen saturation is on the low side which is explained by the fact that the patient uses oxygen at home. Would love to place on amoxicillin however the patient states she is unable to tolerate the 10 day course of amoxicillin and witnessed something of shorter duration. - azithromycin (ZITHROMAX) 250 MG tablet; Take 2 tabs (500 mg) on day 1 then 1 tablet (250 mg) on days 2-5  Dispense: 6 tablet; Refill:  0   Meds ordered this encounter  Medications  . azithromycin (ZITHROMAX) 250 MG tablet    Sig: Take 2 tabs (500 mg) on day 1 then 1 tablet (250 mg) on days 2-5    Dispense:  6 tablet    Refill:  0    Follow-up: Return in about 3 weeks (around 07/31/2015) for follow up of Diabetets Mellitus with PCP- Dr Adrian Blackwater.   Arnoldo Morale MD

## 2015-07-10 NOTE — Telephone Encounter (Signed)
Pt stated still with Cold Sx Appointment schedule with Dr Jarold Song today

## 2015-07-10 NOTE — Patient Instructions (Signed)

## 2015-07-16 ENCOUNTER — Telehealth: Payer: Self-pay | Admitting: Family Medicine

## 2015-07-16 DIAGNOSIS — M170 Bilateral primary osteoarthritis of knee: Secondary | ICD-10-CM

## 2015-07-16 NOTE — Telephone Encounter (Signed)
What type of brace is patient needing, for what area and for which side? Patient advised to schedule OV to discuss additional concerns with PCP

## 2015-07-16 NOTE — Telephone Encounter (Signed)
Pt stated unsure of wrist splint she was proscribed before splint was given by orthopedic office  Wrist plint order now is not working proper and is a cheap kind Explained to pt wrist prices and quality was insurance decision. Rx was given by Dr Adrian Blackwater in the past . Pt advised to maintain appointment on Jan 17 with Dr Adrian Blackwater for additional concerns.

## 2015-07-16 NOTE — Telephone Encounter (Signed)
Patient would like to know if she could get a prescription for a brace.......the brace she has right now does not fit correctly...patient would like her PCP to call her and not a nurse, she has a concern she can only discuss with her PCP.Marland Kitchenplease call patient

## 2015-07-18 DIAGNOSIS — M170 Bilateral primary osteoarthritis of knee: Secondary | ICD-10-CM | POA: Insufficient documentation

## 2015-07-18 NOTE — Telephone Encounter (Signed)
Please inform patient that order for knee braces received from [facility]  It is best for patient to use a local company in case adjustments are needed in regards to fit.   So, I have ordered  Knee brace and R wrist splint Patient to take order to [facility].  Address on on order.  Order ready for pick or can be mailed if patient prefers, please verify address if placing in mail

## 2015-07-19 ENCOUNTER — Other Ambulatory Visit: Payer: Self-pay | Admitting: Family Medicine

## 2015-07-23 ENCOUNTER — Encounter: Payer: Self-pay | Admitting: Family Medicine

## 2015-07-23 ENCOUNTER — Ambulatory Visit: Payer: Medicare Other | Attending: Family Medicine | Admitting: Family Medicine

## 2015-07-23 VITALS — BP 121/70 | HR 75 | Temp 98.3°F | Resp 16 | Ht 63.0 in | Wt 240.0 lb

## 2015-07-23 DIAGNOSIS — E1165 Type 2 diabetes mellitus with hyperglycemia: Secondary | ICD-10-CM

## 2015-07-23 DIAGNOSIS — K0889 Other specified disorders of teeth and supporting structures: Secondary | ICD-10-CM | POA: Insufficient documentation

## 2015-07-23 DIAGNOSIS — R0982 Postnasal drip: Secondary | ICD-10-CM | POA: Diagnosis not present

## 2015-07-23 DIAGNOSIS — R05 Cough: Secondary | ICD-10-CM | POA: Diagnosis not present

## 2015-07-23 DIAGNOSIS — IMO0001 Reserved for inherently not codable concepts without codable children: Secondary | ICD-10-CM

## 2015-07-23 DIAGNOSIS — E11649 Type 2 diabetes mellitus with hypoglycemia without coma: Secondary | ICD-10-CM | POA: Insufficient documentation

## 2015-07-23 DIAGNOSIS — Z7982 Long term (current) use of aspirin: Secondary | ICD-10-CM | POA: Insufficient documentation

## 2015-07-23 DIAGNOSIS — J3489 Other specified disorders of nose and nasal sinuses: Secondary | ICD-10-CM | POA: Insufficient documentation

## 2015-07-23 DIAGNOSIS — Z79899 Other long term (current) drug therapy: Secondary | ICD-10-CM | POA: Diagnosis not present

## 2015-07-23 DIAGNOSIS — J321 Chronic frontal sinusitis: Secondary | ICD-10-CM | POA: Diagnosis not present

## 2015-07-23 DIAGNOSIS — Z794 Long term (current) use of insulin: Secondary | ICD-10-CM | POA: Insufficient documentation

## 2015-07-23 LAB — GLUCOSE, POCT (MANUAL RESULT ENTRY): POC GLUCOSE: 233 mg/dL — AB (ref 70–99)

## 2015-07-23 MED ORDER — INSULIN LISPRO PROT & LISPRO (75-25 MIX) 100 UNIT/ML ~~LOC~~ SUSP: 80.0000 [IU] | Freq: Two times a day (BID) | SUBCUTANEOUS | Status: DC

## 2015-07-23 MED ORDER — AZITHROMYCIN 250 MG PO TABS: 250.0000 mg | ORAL_TABLET | Freq: Once | ORAL | Status: DC

## 2015-07-23 MED ORDER — SITAGLIPTIN PHOSPHATE 50 MG PO TABS: 50.0000 mg | ORAL_TABLET | Freq: Every day | ORAL | Status: DC

## 2015-07-23 NOTE — Telephone Encounter (Signed)
Pt notified at OV

## 2015-07-23 NOTE — Progress Notes (Signed)
F/U DM  Glucose running 50-200 Pain today  No tobacco user  No suicidal thought in the past two weeks

## 2015-07-23 NOTE — Assessment & Plan Note (Signed)
A: uncontrolled diabetes with hypoglycemia and evidence of insulin resistance P: Decrease humalog 75/25  from  75 U TID to  80 U BID to prevent overlapping peaks  Add januvia 50 mg daily Discussed insulin resistance with patient and the importance  of low carb and exercise

## 2015-07-23 NOTE — Assessment & Plan Note (Signed)
Dental referral to evaluate teeth and need for extraction

## 2015-07-23 NOTE — Progress Notes (Signed)
Subjective:  Patient ID: Delane Walpole, female    DOB: 05/02/1951  Age: 65 y.o. MRN: [id number]  CC: Diabetes   HPI Rashonda Barroga presents for    1. CHRONIC DIABETES  Disease Monitoring  Blood Sugar Ranges: 50-200  Polyuria: no   Visual problems: no   Medication Compliance: yes  Medication Side Effects  Hypoglycemia: yes   Preventitive Health Care  Eye Exam: due   Foot Exam: done today   Diet pattern: drinking juice to raise CBGs when low   Exercise: minimal   2. Recurrent sinusitis: she reports sinus pressure, congestion, post nasal drip and cough most days since 11/2014. She eats vicks vapor rub with coffee for congestion. She recently took a partial dose of azithromycin for sinusitis. She lost some of the pills. She is requesting a referral to check on her sinuses.   3. Poor dentition: she would like her teeth pulled. She reports pain in molars. She would like dentures. She is concerned about dental abscess.   Social History  Substance Use Topics  . Smoking status: Never Smoker   . Smokeless tobacco: Not on file  . Alcohol Use: No    Outpatient Prescriptions Prior to Visit  Medication Sig Dispense Refill  . albuterol (PROVENTIL HFA;VENTOLIN HFA) 108 (90 BASE) MCG/ACT inhaler Inhale 2 puffs into the lungs every 6 (six) hours as needed for wheezing or shortness of breath. 1 Inhaler 3  . albuterol (PROVENTIL) (2.5 MG/3ML) 0.083% nebulizer solution INHALE CONTENTS OF 1 VIAL VIA NEBULIZER EVERY 6 HOURS AS NEEDED FOR WHEEZING OR SHORTNESS OF BREATH 450 mL 1  . aspirin 81 MG chewable tablet 81 mg daily.    Marland Kitchen atenolol (TENORMIN) 50 MG tablet Take 1 tablet (50 mg total) by mouth 2 (two) times daily. 60 tablet 5  . azithromycin (ZITHROMAX) 250 MG tablet Take 2 tabs (500 mg) on day 1 then 1 tablet (250 mg) on days 2-5 6 tablet 0  . benzonatate (TESSALON) 100 MG capsule Take 1 capsule (100 mg total) by mouth 2 (two) times daily as needed for cough. (Patient not taking: Reported  on 07/10/2015) 30 capsule 3  . calcium-vitamin D (OSCAL WITH D) 500-200 MG-UNIT per tablet Take 1 tablet by mouth daily. 30 tablet 2  . dextromethorphan-guaiFENesin (MUCINEX DM) 30-600 MG 12hr tablet Take 1 tablet by mouth 2 (two) times daily as needed for cough.    . Fluticasone-Salmeterol (ADVAIR DISKUS) 500-50 MCG/DOSE AEPB Inhale 1 puff into the lungs 2 (two) times daily. 60 each 5  . glucose blood (FREESTYLE TEST STRIPS) test strip Use as instructed 100 each 12  . HUMALOG MIX 75/25 (75-25) 100 UNIT/ML SUSP injection INJECT 75 UNITS INTO THE SKIN THREE TIMES DAILY WITH MEALS 70 mL 2  . hydrochlorothiazide (HYDRODIURIL) 25 MG tablet Take 1 tablet (25 mg total) by mouth daily. 90 tablet 3  . HYDROcodone-acetaminophen (NORCO) 7.5-325 MG per tablet Take 1 tablet by mouth every 6 (six) hours as needed for moderate pain or severe pain.   0  . Insulin Syringe-Needle U-100 (INSULIN SYRINGE 1CC/28G) 28G X 1/2" 1 ML MISC 1 each by Does not apply route 3 (three) times daily. 90 each 11  . Lancets (FREESTYLE) lancets Use as instructed 100 each 12  . lidocaine (XYLOCAINE) 5 % ointment Apply 1 application topically 4 (four) times daily as needed. 744.24 g 3  . lisinopril (PRINIVIL,ZESTRIL) 5 MG tablet Take 1 tablet (5 mg total) by mouth daily. 90 tablet 3  . loratadine (  CLARITIN) 10 MG tablet Take 1 tablet (10 mg total) by mouth daily. (Patient not taking: Reported on 07/10/2015) 30 tablet 5  . montelukast (SINGULAIR) 10 MG tablet Take 1 tablet (10 mg total) by mouth daily. 90 tablet 3  . nitroGLYCERIN (NITROSTAT) 0.3 MG SL tablet Place 0.3 mg under the tongue every 5 (five) minutes as needed for chest pain.     Marland Kitchen omega-3 acid ethyl esters (LOVAZA) 1 G capsule Take 1 capsule (1,000 mg total) by mouth 2 (two) times daily. 180 capsule 3  . omeprazole (PRILOSEC) 40 MG capsule Take 1 capsule (40 mg total) by mouth daily. 30 capsule 1  . potassium chloride (K-DUR) 10 MEQ tablet TAKE 1 TABLET(10 MEQ) BY MOUTH DAILY 90  tablet 2  . topiramate (TOPAMAX) 50 MG tablet Take 50 mg by mouth 2 (two) times daily.  2   No facility-administered medications prior to visit.    ROS Review of Systems  Constitutional: Negative for fever and chills.  HENT: Positive for congestion and dental problem. Negative for trouble swallowing.   Eyes: Negative for visual disturbance.  Respiratory: Positive for cough. Negative for shortness of breath.   Cardiovascular: Negative for chest pain.  Gastrointestinal: Negative for abdominal pain and blood in stool.  Musculoskeletal: Negative for back pain and arthralgias.  Skin: Negative for rash.  Allergic/Immunologic: Negative for immunocompromised state.  Hematological: Negative for adenopathy. Does not bruise/bleed easily.  Psychiatric/Behavioral: Negative for suicidal ideas and dysphoric mood.    Objective:  BP 121/70 mmHg  Pulse 75  Temp(Src) 98.3 F (36.8 C) (Oral)  Resp 16  Ht 5\' 3"  (1.6 m)  Wt 240 lb (108.863 kg)  BMI 42.52 kg/m2  SpO2 96%  BP/Weight 07/23/2015 07/10/2015 123XX123  Systolic BP 123XX123 0000000 123XX123  Diastolic BP 70 67 73  Wt. (Lbs) 240 241 248.2  BMI 42.52 42.7 43.98    Physical Exam  Constitutional: She is oriented to person, place, and time. She appears well-developed and well-nourished. No distress.  HENT:  Head: Normocephalic and atraumatic.  Cardiovascular: Normal rate, regular rhythm, normal heart sounds and intact distal pulses.   Pulmonary/Chest: Effort normal and breath sounds normal.  Abdominal: Soft. Bowel sounds are normal.  Truncal obesity   Musculoskeletal: She exhibits no edema.  Neurological: She is alert and oriented to person, place, and time.  Skin: Skin is warm and dry. No rash noted.  Psychiatric: She has a normal mood and affect.   Lab Results  Component Value Date   HGBA1C 12.50 07/10/2015   CBg 233 Assessment & Plan:   Problem List Items Addressed This Visit    Chronic frontal sinusitis    CT sinus to evaluate  suspected chronic sinusitis with a plan to treat if confirmed       Relevant Medications   azithromycin (ZITHROMAX) 250 MG tablet   Other Relevant Orders   CT Maxillofacial WO CM   Diabetes type 2, uncontrolled ([place]) - Primary (Chronic)    A: uncontrolled diabetes with hypoglycemia and evidence of insulin resistance P: Decrease humalog 75/25  from  75 U TID to  80 U BID to prevent overlapping peaks  Add januvia 50 mg daily Discussed insulin resistance with patient and the importance  of low carb and exercise       Relevant Medications   insulin lispro protamine-lispro (HUMALOG MIX 75/25) (75-25) 100 UNIT/ML SUSP injection   sitaGLIPtin (JANUVIA) 50 MG tablet   Other Relevant Orders   Glucose (CBG) (Completed)  Basic Metabolic Panel   Ambulatory referral to Ophthalmology   Pain, dental    Dental referral to evaluate teeth and need for extraction       Relevant Orders   Ambulatory referral to Dentistry      No orders of the defined types were placed in this encounter.    Follow-up: No Follow-up on file.   Boykin Nearing MD

## 2015-07-23 NOTE — Patient Instructions (Addendum)
Shannen was seen today for diabetes.  Diagnoses and all orders for this visit:  Uncontrolled type 2 diabetes mellitus without complication, with long-term current use of insulin ([facility]) -     Glucose (CBG) -     insulin lispro protamine-lispro (HUMALOG MIX 75/25) (75-25) 100 UNIT/ML SUSP injection; Inject 80 Units into the skin 2 (two) times daily with a meal. -     sitaGLIPtin (JANUVIA) 50 MG tablet; Take 1 tablet (50 mg total) by mouth daily. -     Basic Metabolic Panel; Future -     Ambulatory referral to Ophthalmology  Chronic frontal sinusitis -     azithromycin (ZITHROMAX) 250 MG tablet; Take 1 tablet (250 mg total) by mouth once. 500 mg today, then 250 mg daily for 4 more days -     CT Maxillofacial WO CM; Future  Pain, dental -     Ambulatory referral to Dentistry   Diabetes blood sugar goals  Fasting (in AM before breakfast, 8 hrs of no eating or drinking (except water or unsweetened coffee or tea): 90-110 2 hrs after meals: < 160,   No low sugars: nothing < 70   F/u in 2 weeks for BMP, labs draw F/u with me in  8 weeks for diabetes   Dr. Adrian Blackwater

## 2015-07-23 NOTE — Assessment & Plan Note (Signed)
CT sinus to evaluate suspected chronic sinusitis with a plan to treat if confirmed

## 2015-07-29 ENCOUNTER — Encounter: Payer: Self-pay | Admitting: Internal Medicine

## 2015-07-29 ENCOUNTER — Ambulatory Visit (INDEPENDENT_AMBULATORY_CARE_PROVIDER_SITE_OTHER): Payer: Medicare Other | Admitting: Internal Medicine

## 2015-07-29 VITALS — BP 124/80 | HR 72 | Ht 64.0 in | Wt 241.5 lb

## 2015-07-29 DIAGNOSIS — K219 Gastro-esophageal reflux disease without esophagitis: Secondary | ICD-10-CM

## 2015-07-29 DIAGNOSIS — Z86010 Personal history of colonic polyps: Secondary | ICD-10-CM

## 2015-07-29 DIAGNOSIS — Z1211 Encounter for screening for malignant neoplasm of colon: Secondary | ICD-10-CM

## 2015-07-29 NOTE — Progress Notes (Signed)
HISTORY OF PRESENT ILLNESS:  Heidi [name] is a 65 y.o. female with hypertension, asthma, morbid obesity, diabetes mellitus, and renal insufficiency. She is self referred today to establish GI care inquires regarding the need for follow-up colonoscopy. She states that she wants to be extra cautious to avoid colon cancer. She brings records from [facility]. She is accompanied by her daughter. The patient's father was diagnosed with colon cancer in his 51s. She underwent her initial colonoscopy in June 2015. I have reviewed photographs as well as a summary letter regarding her pathology. She had 4 hyperplastic polyps removed and one adenoma. She was told follow-up in 3 years. Patient has no active GI complaints currently. She did have some transient constipation which is no longer an issue. She also has a history of GERD which nicely controlled with omeprazole 40 mg daily. No dysphagia. During her evaluation her daughter was having issues with hypoglycemia which required nursing staff assistance  REVIEW OF SYSTEMS:  All non-GI ROS negative except for sinus and allergy trouble, anxiety, arthritis, back pain  Past Medical History  Diagnosis Date  . Asthma   . Hypertension   . Diabetes mellitus without complication [facility])     Past Surgical History  Procedure Laterality Date  . Abdominal hysterectomy      Social History [name]  reports that she has never smoked. She does not have any smokeless tobacco history on file. She reports that she does not drink alcohol or use illicit drugs.  family history includes Colon cancer in her father and sister; Diabetes type II in her brother and sister.  No Known Allergies     PHYSICAL EXAMINATION: Vital signs: BP 124/80 mmHg  Pulse 72  Ht 5\' 4"  (1.626 m)  Wt 241 lb 8 oz (109.544 kg)  BMI 41.43 kg/m2  Constitutional: Obese, but generally well-appearing, no acute distress Psychiatric: alert and oriented x3, cooperative Eyes: extraocular  movements intact, anicteric, conjunctiva pink Mouth: oral pharynx moist, no lesions Neck: supple without thyromegaly Lymph: no lymphadenopathy Cardiovascular: heart regular rate and rhythm, no murmur Lungs: clear to auscultation bilaterally Abdomen: soft, obese, nontender, nondistended, no obvious ascites, no peritoneal signs, normal bowel sounds, no organomegaly Rectal: Ommitted Extremities: no clubbing cyanosis or lower extremity edema bilaterally Skin: no lesions on visible extremities Neuro: No focal deficits. Cranial nerves intact. No asterixis.   ASSESSMENT:  #1. Personal history of colonic adenoma. Follow-up was recommended in 3 years. I suspect this was secondary to polyp size which appears to be approximate 1 cm on images #2. Family history of colon cancer in father around age 62's #3. GERD. Asymptomatic on PPI #4. History of constipation. No longer problematic #5. Morbid obesity   PLAN:  #1. Advised that she does not need colonoscopy at this time. Would keep the recommendation of 3 years from her last exam or around June 2018. Recall has been entered into our computer system #2. Reflux precautions with attention to weight loss #3. Lowest dose of PPI to control reflux symptoms #4. High-fiber diet in playing of water #5. Return to the care of her PCP  45 minutes spent face-to-face with the patient. Greater than 50% of the time use for counseling regarding appropriate recommendations for colon cancer and polyp surveillance, GERD, constipation, and obesity

## 2015-07-29 NOTE — Patient Instructions (Signed)
You have been scheduled for a colonoscopy recall for 12/2016

## 2015-07-30 DIAGNOSIS — M47892 Other spondylosis, cervical region: Secondary | ICD-10-CM | POA: Diagnosis not present

## 2015-07-30 DIAGNOSIS — M542 Cervicalgia: Secondary | ICD-10-CM | POA: Diagnosis not present

## 2015-07-31 ENCOUNTER — Telehealth: Payer: Self-pay | Admitting: Family Medicine

## 2015-07-31 ENCOUNTER — Ambulatory Visit (HOSPITAL_COMMUNITY)
Admission: RE | Admit: 2015-07-31 | Discharge: 2015-07-31 | Disposition: A | Discharge status: Home / Self Care | Payer: Medicare Other | Source: Ambulatory Visit | Attending: Family Medicine | Admitting: Family Medicine

## 2015-07-31 DIAGNOSIS — E1122 Type 2 diabetes mellitus with diabetic chronic kidney disease: Secondary | ICD-10-CM

## 2015-07-31 DIAGNOSIS — IMO0002 Reserved for concepts with insufficient information to code with codable children: Secondary | ICD-10-CM

## 2015-07-31 DIAGNOSIS — E1165 Type 2 diabetes mellitus with hyperglycemia: Principal | ICD-10-CM

## 2015-07-31 DIAGNOSIS — J329 Chronic sinusitis, unspecified: Secondary | ICD-10-CM | POA: Diagnosis not present

## 2015-07-31 DIAGNOSIS — J321 Chronic frontal sinusitis: Secondary | ICD-10-CM | POA: Diagnosis not present

## 2015-07-31 DIAGNOSIS — Z794 Long term (current) use of insulin: Principal | ICD-10-CM

## 2015-07-31 DIAGNOSIS — J3489 Other specified disorders of nose and nasal sinuses: Secondary | ICD-10-CM

## 2015-07-31 DIAGNOSIS — N182 Chronic kidney disease, stage 2 (mild): Principal | ICD-10-CM

## 2015-07-31 NOTE — Telephone Encounter (Signed)
Patient called states she has lost her rx for brace that was given to her, patient needs another. Patient also mentioned she wants results. Please f/u

## 2015-08-01 MED ORDER — METFORMIN HCL 500 MG PO TABS: 500.0000 mg | ORAL_TABLET | Freq: Two times a day (BID) | ORAL | Status: DC

## 2015-08-01 NOTE — Assessment & Plan Note (Signed)
Also please inform patient that her prescription insurance, [employer] does not cover [place].  I have ordered metformin 500 mg BID to start to replace [place], She has insulin resistance and metformin is recommended to  Help overcome this, also to control sugars and reduce weight gain associated with high doses of insulin.  We will monitor her renal function every 3 months while on metformin

## 2015-08-01 NOTE — Telephone Encounter (Addendum)
1. New Rx for wrist brace available for pick up  2. Also please inform patient that her prescription insurance, [employer] does not cover [place].  I have ordered metformin 500 mg BID to start to replace [place], She has insulin resistance and metformin is recommended to  Help overcome this, also to control sugars and reduce weight gain associated with high doses of insulin.  We will monitor her renal function every 3 months while on metformin  3. ENT referral placed following review of CT head done on 07/31/15. There is no air in the L frontal sinus this can be due to chronic inflammation or blockage, referral placed for further review of evaluation.

## 2015-08-01 NOTE — Assessment & Plan Note (Signed)
L frontal sinus pain, I suspected chronic sinusitis. CT obtained and reviewed. There is no air in the L frontal sinus this possibly reflects chronic sinusitis vs blockage since the picture is not clear, ENT referral placed for review and treatment

## 2015-08-02 NOTE — Telephone Encounter (Signed)
LVM to return call    Rx at front office

## 2015-08-05 ENCOUNTER — Telehealth: Payer: Self-pay | Admitting: Family Medicine

## 2015-08-05 NOTE — Telephone Encounter (Signed)
-----   Message from Boykin Nearing, MD sent at 08/01/2015  1:19 PM EST ----- There is no air in the L frontal sinus this possibly reflects chronic sinusitis vs blockage since the picture is not clear, ENT referral placed for review and treatment

## 2015-08-05 NOTE — Telephone Encounter (Signed)
Patient came in requesting a brace for left knee.  Please follow up.

## 2015-08-05 NOTE — Telephone Encounter (Signed)
Unable to contact Pt  "voice mail is full"  Rx Knee brace bilateral fax to [facility]  At [phone]

## 2015-08-06 ENCOUNTER — Other Ambulatory Visit: Payer: Medicare Other

## 2015-08-07 NOTE — Telephone Encounter (Signed)
Pt notified. ENT referral placed. 

## 2015-08-15 ENCOUNTER — Telehealth: Payer: Self-pay | Admitting: Family Medicine

## 2015-08-15 ENCOUNTER — Other Ambulatory Visit: Payer: Self-pay | Admitting: Family Medicine

## 2015-08-15 DIAGNOSIS — I5033 Acute on chronic diastolic (congestive) heart failure: Secondary | ICD-10-CM

## 2015-08-15 NOTE — Telephone Encounter (Signed)
Pt need Omeprazole Refill Thank you

## 2015-08-16 NOTE — Telephone Encounter (Signed)
Rx refill send to [facility]

## 2015-08-21 ENCOUNTER — Other Ambulatory Visit: Payer: Self-pay

## 2015-08-21 DIAGNOSIS — Z1231 Encounter for screening mammogram for malignant neoplasm of breast: Secondary | ICD-10-CM

## 2015-08-22 DIAGNOSIS — K219 Gastro-esophageal reflux disease without esophagitis: Secondary | ICD-10-CM | POA: Diagnosis not present

## 2015-08-22 DIAGNOSIS — E042 Nontoxic multinodular goiter: Secondary | ICD-10-CM | POA: Diagnosis not present

## 2015-08-23 ENCOUNTER — Telehealth: Payer: Self-pay | Admitting: Family Medicine

## 2015-08-23 NOTE — Telephone Encounter (Signed)
Pt. States that insurance needs prior authorization to get Januvia.... This is the insurance [id number].Marland KitchenMarland KitchenMarland KitchenMarland Kitchen

## 2015-08-26 NOTE — Telephone Encounter (Signed)
Patient wanted to know status of Januvia authorization. Please follow up.

## 2015-08-27 NOTE — Telephone Encounter (Signed)
Please inform patient,  She  has not tried and failed two preferred medications/med classes. She has only tried insulin so far.  Celesta Gentile will not be approved at this time. Metformin has been ordered

## 2015-08-28 ENCOUNTER — Encounter (HOSPITAL_COMMUNITY): Payer: Self-pay | Admitting: Emergency Medicine

## 2015-08-28 ENCOUNTER — Emergency Department (HOSPITAL_COMMUNITY)
Admission: EM | Admit: 2015-08-28 | Discharge: 2015-08-29 | Disposition: A | Discharge status: Home / Self Care | Payer: Medicare Other | Attending: Emergency Medicine | Admitting: Emergency Medicine

## 2015-08-28 ENCOUNTER — Emergency Department (HOSPITAL_COMMUNITY): Payer: Medicare Other

## 2015-08-28 DIAGNOSIS — M542 Cervicalgia: Secondary | ICD-10-CM | POA: Insufficient documentation

## 2015-08-28 DIAGNOSIS — Z794 Long term (current) use of insulin: Secondary | ICD-10-CM | POA: Diagnosis not present

## 2015-08-28 DIAGNOSIS — I10 Essential (primary) hypertension: Secondary | ICD-10-CM | POA: Insufficient documentation

## 2015-08-28 DIAGNOSIS — Z7984 Long term (current) use of oral hypoglycemic drugs: Secondary | ICD-10-CM | POA: Insufficient documentation

## 2015-08-28 DIAGNOSIS — E119 Type 2 diabetes mellitus without complications: Secondary | ICD-10-CM | POA: Diagnosis not present

## 2015-08-28 DIAGNOSIS — Z79899 Other long term (current) drug therapy: Secondary | ICD-10-CM | POA: Diagnosis not present

## 2015-08-28 DIAGNOSIS — R51 Headache: Secondary | ICD-10-CM | POA: Insufficient documentation

## 2015-08-28 DIAGNOSIS — J45909 Unspecified asthma, uncomplicated: Secondary | ICD-10-CM | POA: Insufficient documentation

## 2015-08-28 DIAGNOSIS — Z7951 Long term (current) use of inhaled steroids: Secondary | ICD-10-CM | POA: Diagnosis not present

## 2015-08-28 DIAGNOSIS — Z7982 Long term (current) use of aspirin: Secondary | ICD-10-CM | POA: Diagnosis not present

## 2015-08-28 DIAGNOSIS — S0990XA Unspecified injury of head, initial encounter: Secondary | ICD-10-CM | POA: Diagnosis not present

## 2015-08-28 DIAGNOSIS — S199XXA Unspecified injury of neck, initial encounter: Secondary | ICD-10-CM | POA: Diagnosis not present

## 2015-08-28 DIAGNOSIS — R519 Headache, unspecified: Secondary | ICD-10-CM

## 2015-08-28 LAB — CBC
HCT: 40.5 % (ref 36.0–46.0)
Hemoglobin: 12.7 g/dL (ref 12.0–15.0)
MCH: 26.8 pg (ref 26.0–34.0)
MCHC: 31.4 g/dL (ref 30.0–36.0)
MCV: 85.4 fL (ref 78.0–100.0)
PLATELETS: 214 10*3/uL (ref 150–400)
RBC: 4.74 MIL/uL (ref 3.87–5.11)
RDW: 14.4 % (ref 11.5–15.5)
WBC: 8 10*3/uL (ref 4.0–10.5)

## 2015-08-28 MED ORDER — OXYCODONE-ACETAMINOPHEN 5-325 MG PO TABS
ORAL_TABLET | ORAL | Status: AC
  Filled 2015-08-28: qty 1

## 2015-08-28 MED ORDER — KETOROLAC TROMETHAMINE 30 MG/ML IJ SOLN
60.0000 mg | Freq: Once | INTRAMUSCULAR | Status: AC
  Administered 2015-08-29: 60 mg via INTRAMUSCULAR
  Filled 2015-08-28: qty 2

## 2015-08-28 MED ORDER — METOCLOPRAMIDE HCL 5 MG/ML IJ SOLN
10.0000 mg | Freq: Once | INTRAMUSCULAR | Status: AC
  Administered 2015-08-29: 10 mg via INTRAMUSCULAR
  Filled 2015-08-28: qty 2

## 2015-08-28 MED ORDER — OXYCODONE-ACETAMINOPHEN 5-325 MG PO TABS
1.0000 | ORAL_TABLET | Freq: Once | ORAL | Status: AC
  Administered 2015-08-28: 1 via ORAL

## 2015-08-28 MED ORDER — DIAZEPAM 5 MG PO TABS
5.0000 mg | ORAL_TABLET | Freq: Once | ORAL | Status: AC
  Administered 2015-08-29: 5 mg via ORAL
  Filled 2015-08-28: qty 1

## 2015-08-28 NOTE — ED Notes (Signed)
Pt refused pain meds when offered.

## 2015-08-28 NOTE — ED Provider Notes (Signed)
CSN: [id number]     Arrival date & time 08/28/15  1650 History  By signing my name below, I, Evelene Croon, attest that this documentation has been prepared under the direction and in the presence of Jola Schmidt, MD . Electronically Signed: Evelene Croon, Scribe. 08/28/2015. 11:49 PM.    Chief Complaint  Patient presents with  . Headache    The history is provided by the patient. No language interpreter was used.    HPI Comments:  Heidi [name] is a 65 y.o. female who presents to the Emergency Department complaining of "severe" HA with pain located to frontal and occipital regions x a few weeks. She reports associated neck pain/ stiffness that has progressively worsened and tingling in her upper extremities. Pt has a h/o similar pain secondary to migraines, for which she receives botox and Topamax. Her last dose of botox was ~ 3 months ago.  She also has a h/o neck pain and neck stiffness and has been to rehab for this pain. She has been taking hydrocodone and topomax  with little relief. Pt notes she moved to [place] ~ 1 year ago and has not established a neurologist/HA team in town but is still followed by old team in [place].    Past Medical History  Diagnosis Date  . Asthma   . Hypertension   . Diabetes mellitus without complication [facility])    Past Surgical History  Procedure Laterality Date  . Abdominal hysterectomy     Family History  Problem Relation Age of Onset  . Colon cancer Father   . Diabetes type II Sister   . Diabetes type II Brother   . Colon cancer Sister    Social History  Substance Use Topics  . Smoking status: Never Smoker   . Smokeless tobacco: None  . Alcohol Use: No   OB History    No data available     Review of Systems 10 systems reviewed and all are negative for acute change except as noted in the HPI.  Allergies  Review of patient's allergies indicates no known allergies.  Home Medications   Prior to Admission medications   Medication Sig Start Date  End Date Taking? Authorizing Provider  albuterol (PROVENTIL HFA;VENTOLIN HFA) 108 (90 BASE) MCG/ACT inhaler Inhale 2 puffs into the lungs every 6 (six) hours as needed for wheezing or shortness of breath. 12/31/14   Arnoldo Morale, MD  albuterol (PROVENTIL) (2.5 MG/3ML) 0.083% nebulizer solution INHALE CONTENTS OF 1 VIAL VIA NEBULIZER EVERY 6 HOURS AS NEEDED FOR WHEEZING OR SHORTNESS OF BREATH 07/10/15   Boykin Nearing, MD  aspirin 81 MG chewable tablet 81 mg daily.    Historical Provider, MD  atenolol (TENORMIN) 50 MG tablet Take 1 tablet (50 mg total) by mouth 2 (two) times daily. 02/11/15   Josalyn Funches, MD  bumetanide (BUMEX) 0.5 MG tablet TAKE 1 TABLET BY MOUTH EVERY DAY 08/16/15   Boykin Nearing, MD  calcium-vitamin D (OSCAL WITH D) 500-200 MG-UNIT per tablet Take 1 tablet by mouth daily. 12/18/14   Arnoldo Morale, MD  Fluticasone-Salmeterol (ADVAIR DISKUS) 500-50 MCG/DOSE AEPB Inhale 1 puff into the lungs 2 (two) times daily. 04/15/15   Josalyn Funches, MD  glucose blood (FREESTYLE TEST STRIPS) test strip Use as instructed 12/31/14   Arnoldo Morale, MD  hydrochlorothiazide (HYDRODIURIL) 25 MG tablet Take 1 tablet (25 mg total) by mouth daily. 05/08/15   Josalyn Funches, MD  HYDROcodone-acetaminophen (NORCO) 7.5-325 MG per tablet Take 1 tablet by mouth every 6 (six)  hours as needed for moderate pain or severe pain.  11/19/14   Historical Provider, MD  insulin lispro protamine-lispro (HUMALOG MIX 75/25) (75-25) 100 UNIT/ML SUSP injection Inject 80 Units into the skin 2 (two) times daily with a meal. 07/23/15   Josalyn Funches, MD  Insulin Syringe-Needle U-100 (INSULIN SYRINGE 1CC/28G) 28G X 1/2" 1 ML MISC 1 each by Does not apply route 3 (three) times daily. 02/11/15   Boykin Nearing, MD  Lancets (FREESTYLE) lancets Use as instructed 12/11/14   Arnoldo Morale, MD  lidocaine (XYLOCAINE) 5 % ointment Apply 1 application topically 4 (four) times daily as needed. 06/04/15   Josalyn Funches, MD  lisinopril  (PRINIVIL,ZESTRIL) 5 MG tablet Take 1 tablet (5 mg total) by mouth daily. 05/08/15   Boykin Nearing, MD  metFORMIN (GLUCOPHAGE) 500 MG tablet Take 1 tablet (500 mg total) by mouth 2 (two) times daily with a meal. 08/01/15   Josalyn Funches, MD  montelukast (SINGULAIR) 10 MG tablet Take 1 tablet (10 mg total) by mouth daily. 05/08/15   Josalyn Funches, MD  nitroGLYCERIN (NITROSTAT) 0.3 MG SL tablet Place 0.3 mg under the tongue every 5 (five) minutes as needed for chest pain.     Historical Provider, MD  omega-3 acid ethyl esters (LOVAZA) 1 G capsule Take 1 capsule (1,000 mg total) by mouth 2 (two) times daily. 05/08/15   Josalyn Funches, MD  omeprazole (PRILOSEC) 40 MG capsule TAKE 1 CAPSULE BY MOUTH DAILY 08/15/15   Josalyn Funches, MD  potassium chloride (K-DUR) 10 MEQ tablet TAKE 1 TABLET(10 MEQ) BY MOUTH DAILY 03/15/15   Boykin Nearing, MD  topiramate (TOPAMAX) 50 MG tablet Take 50 mg by mouth 2 (two) times daily. 11/01/14   Historical Provider, MD   BP 141/70 mmHg  Pulse 66  Temp(Src) 98 F (36.7 C) (Oral)  Resp 14  SpO2 98% Physical Exam  Constitutional: She is oriented to person, place, and time. She appears well-developed and well-nourished.  HENT:  Head: Normocephalic and atraumatic.  Eyes: Pupils are equal, round, and reactive to light.  Neck:  No obvious c-spine tenderness   Cardiovascular: Regular rhythm.   Pulmonary/Chest: Effort normal.  Abdominal: Soft.  Musculoskeletal: Normal range of motion.  Neurological: She is alert and oriented to person, place, and time.  5/5 strength in major muscle groups of  bilateral upper and lower extremities. Speech normal. No facial asymetry.   Nursing note and vitals reviewed.   ED Course  Procedures   DIAGNOSTIC STUDIES:  Oxygen Saturation is 95% on RA, normal by my interpretation.    COORDINATION OF CARE:  11:36 PM Pt notes some improvement of pain after receiving the percocet. Will order CT head and migraine cocktail. Discussed  treatment plan with pt at bedside and pt agreed to plan.  Labs Review Labs Reviewed  BASIC METABOLIC PANEL - Abnormal; Notable for the following:    Glucose, Bld 284 (*)    Creatinine, Ser 1.06 (*)    GFR calc non Af Amer 55 (*)    All other components within normal limits  CBC    Imaging Review Ct Head Wo Contrast  08/29/2015  CLINICAL DATA:  Headache and neck pain. Fall striking head on concrete years ago with headache and neck pain since that time. Current right-sided headache. EXAM: CT HEAD WITHOUT CONTRAST CT CERVICAL SPINE WITHOUT CONTRAST TECHNIQUE: Multidetector CT imaging of the head and cervical spine was performed following the standard protocol without intravenous contrast. Multiplanar CT image reconstructions of the cervical spine  were also generated. COMPARISON:  Cervical spine MRI 11/30/2014 FINDINGS: CT HEAD FINDINGS No intracranial hemorrhage, mass effect, or midline shift. No hydrocephalus. The basilar cisterns are patent. No evidence of territorial infarct. No intracranial fluid collection. Age-related atrophy and chronic small vessel ischemia. Calvarium is intact. Included paranasal sinuses and mastoid air cells are well aerated. CT CERVICAL SPINE FINDINGS Cervical spine alignment is maintained. Vertebral body heights are preserved. There is no fracture. The dens is intact. There are no jumped or perched facets. Disc space narrowing at C5-C6 with endplate spurring, similar to prior MRI. No prevertebral soft tissue edema. IMPRESSION: 1.  No acute intracranial abnormality. 2. Degenerative change at C5-C6. No acute bony abnormality of the cervical spine. Electronically Signed   By: Jeb Levering M.D.   On: 08/29/2015 01:31   Ct Cervical Spine Wo Contrast  08/29/2015  CLINICAL DATA:  Headache and neck pain. Fall striking head on concrete years ago with headache and neck pain since that time. Current right-sided headache. EXAM: CT HEAD WITHOUT CONTRAST CT CERVICAL SPINE WITHOUT  CONTRAST TECHNIQUE: Multidetector CT imaging of the head and cervical spine was performed following the standard protocol without intravenous contrast. Multiplanar CT image reconstructions of the cervical spine were also generated. COMPARISON:  Cervical spine MRI 11/30/2014 FINDINGS: CT HEAD FINDINGS No intracranial hemorrhage, mass effect, or midline shift. No hydrocephalus. The basilar cisterns are patent. No evidence of territorial infarct. No intracranial fluid collection. Age-related atrophy and chronic small vessel ischemia. Calvarium is intact. Included paranasal sinuses and mastoid air cells are well aerated. CT CERVICAL SPINE FINDINGS Cervical spine alignment is maintained. Vertebral body heights are preserved. There is no fracture. The dens is intact. There are no jumped or perched facets. Disc space narrowing at C5-C6 with endplate spurring, similar to prior MRI. No prevertebral soft tissue edema. IMPRESSION: 1.  No acute intracranial abnormality. 2. Degenerative change at C5-C6. No acute bony abnormality of the cervical spine. Electronically Signed   By: Jeb Levering M.D.   On: 08/29/2015 01:31   I have personally reviewed and evaluated these images and lab results as part of my medical decision-making.   MDM   Final diagnoses:  Headache, unspecified headache type  Neck pain    1:35 AM Imaging without abnormality.  Mild arthritis in the neck.  Patient feels better at this time.  Discharge home in good condition.  Primary care follow-up.  I've asked that she follow back up with her headache specialist in [place] or develop a relationship with a neurologist or headache center in this community.  I personally performed the services described in this documentation, which was scribed in my presence. The recorded information has been reviewed and is accurate.       Jola Schmidt, MD 08/29/15 [id number]

## 2015-08-28 NOTE — ED Notes (Signed)
Pt from home for eval of headache, states hx of same and on topomax, pt reports pain is getting worse and has started to radiate to neck and bilateral shoulders. Pt states has had MRI of head and neck as well. Pt neurologically intact.

## 2015-08-28 NOTE — Telephone Encounter (Signed)
LVM to return call.

## 2015-08-28 NOTE — ED Notes (Signed)
Patient transported to CT 

## 2015-08-29 DIAGNOSIS — S0990XA Unspecified injury of head, initial encounter: Secondary | ICD-10-CM | POA: Diagnosis not present

## 2015-08-29 DIAGNOSIS — R51 Headache: Secondary | ICD-10-CM | POA: Diagnosis not present

## 2015-08-29 DIAGNOSIS — M542 Cervicalgia: Secondary | ICD-10-CM | POA: Diagnosis not present

## 2015-08-29 DIAGNOSIS — S199XXA Unspecified injury of neck, initial encounter: Secondary | ICD-10-CM | POA: Diagnosis not present

## 2015-08-29 LAB — BASIC METABOLIC PANEL
Anion gap: 12 (ref 5–15)
BUN: 12 mg/dL (ref 6–20)
CHLORIDE: 102 mmol/L (ref 101–111)
CO2: 26 mmol/L (ref 22–32)
CREATININE: 1.06 mg/dL — AB (ref 0.44–1.00)
Calcium: 9.5 mg/dL (ref 8.9–10.3)
GFR calc non Af Amer: 55 mL/min — ABNORMAL LOW (ref >60)
Glucose, Bld: 284 mg/dL — ABNORMAL HIGH (ref 65–99)
POTASSIUM: 3.6 mmol/L (ref 3.5–5.1)
SODIUM: 140 mmol/L (ref 135–145)

## 2015-08-29 NOTE — Discharge Instructions (Signed)

## 2015-09-03 ENCOUNTER — Encounter: Payer: Self-pay | Admitting: Clinical

## 2015-09-03 NOTE — Progress Notes (Signed)
Depression screen [facility] 2/9 07/23/2015 07/10/2015 05/08/2015 03/26/2015 01/14/2015  Decreased Interest 0 0 1 0 1  Down, Depressed, Hopeless 0 0 1 0 1  PHQ - 2 Score 0 0 2 0 2  Altered sleeping 3 - 0 - 0  Tired, decreased energy 3 - 1 - 2  Change in appetite 3 - 0 - 0  Feeling bad or failure about yourself  1 - 1 - 0  Trouble concentrating 2 - 1 - 0  Moving slowly or fidgety/restless 1 - 0 - 3  Suicidal thoughts 0 - 0 - 0  PHQ-9 Score 13 - 5 - 7    GAD 7 : Generalized Anxiety Score 07/23/2015 05/08/2015  Nervous, Anxious, on Edge 2 1  Control/stop worrying 1 1  Worry too much - different things 1 1  Trouble relaxing 0 0  Restless 0 0  Easily annoyed or irritable 0 0  Afraid - awful might happen 0 0  Total GAD 7 Score 4 3

## 2015-09-05 ENCOUNTER — Ambulatory Visit
Admission: RE | Admit: 2015-09-05 | Discharge: 2015-09-05 | Disposition: A | Discharge status: Home / Self Care | Payer: Medicare Other | Source: Ambulatory Visit

## 2015-09-05 ENCOUNTER — Other Ambulatory Visit: Payer: Self-pay | Admitting: Family Medicine

## 2015-09-05 DIAGNOSIS — Z1231 Encounter for screening mammogram for malignant neoplasm of breast: Secondary | ICD-10-CM

## 2015-09-05 DIAGNOSIS — Z794 Long term (current) use of insulin: Secondary | ICD-10-CM

## 2015-09-05 DIAGNOSIS — J45901 Unspecified asthma with (acute) exacerbation: Secondary | ICD-10-CM

## 2015-09-05 DIAGNOSIS — E1122 Type 2 diabetes mellitus with diabetic chronic kidney disease: Secondary | ICD-10-CM

## 2015-09-05 DIAGNOSIS — IMO0002 Reserved for concepts with insufficient information to code with codable children: Secondary | ICD-10-CM

## 2015-09-05 DIAGNOSIS — N182 Chronic kidney disease, stage 2 (mild): Secondary | ICD-10-CM

## 2015-09-05 DIAGNOSIS — E1165 Type 2 diabetes mellitus with hyperglycemia: Secondary | ICD-10-CM

## 2015-09-05 MED ORDER — METFORMIN HCL 500 MG PO TABS: 500.0000 mg | ORAL_TABLET | Freq: Two times a day (BID) | ORAL | Status: DC

## 2015-09-05 NOTE — Telephone Encounter (Signed)
Patient called requesting insulin, patient states she is completely out of medication. Please f/u

## 2015-09-05 NOTE — Telephone Encounter (Signed)
Left message with female to return call  Please inform pt: pt has refills at [facility]  Need to call pharmacy for refills

## 2015-09-06 MED ORDER — INSULIN ASPART 100 UNIT/ML ~~LOC~~ SOLN: 40.0000 [IU] | Freq: Three times a day (TID) | SUBCUTANEOUS | Status: DC

## 2015-09-06 NOTE — Telephone Encounter (Signed)
Called patient, Verified name and DOB  She was taking humalog 75/25 TID instead of BID as written and her pharmacy refused to refill it early.  She reports taking it TID bc of hyperglycemia. She reports TID dosing works well for her.    Heidi [name] was denied by her insurance She is intolerant of metformin She has received on vial of humalog 75/25 today from her pharmacist.  Plan:  D/C humalog 75/25 as patient is non-compliant with recommended dosing and at risk for hypoglycemia due to overlapping peaks of insulin Change to novolog 40 U TID  Patient voiced understanding

## 2015-09-06 NOTE — Telephone Encounter (Signed)
Pt. Daughter called stating that pt. Does have refills for her insulin but pt. Is not due for a refill until Sunday. Pt. Would like to know if her PCP can give her a higher dose on her insulin because that is the only way she can get a refill. Please f/u

## 2015-09-06 NOTE — Telephone Encounter (Signed)
Pt. Called stating she checked her blood sugar and it is 423. Pt. Stated she needs her insulin. Please f/u with pt.

## 2015-09-06 NOTE — Telephone Encounter (Signed)
LVM to return call x2  LVM with female to return call

## 2015-09-09 ENCOUNTER — Other Ambulatory Visit: Payer: Self-pay | Admitting: Family Medicine

## 2015-09-11 ENCOUNTER — Encounter (HOSPITAL_COMMUNITY): Payer: Self-pay | Admitting: Vascular Surgery

## 2015-09-11 ENCOUNTER — Emergency Department (HOSPITAL_COMMUNITY)
Admission: EM | Admit: 2015-09-11 | Discharge: 2015-09-11 | Disposition: A | Discharge status: Home / Self Care | Payer: Medicare Other | Attending: Emergency Medicine | Admitting: Emergency Medicine

## 2015-09-11 DIAGNOSIS — Z794 Long term (current) use of insulin: Secondary | ICD-10-CM | POA: Insufficient documentation

## 2015-09-11 DIAGNOSIS — E119 Type 2 diabetes mellitus without complications: Secondary | ICD-10-CM | POA: Diagnosis not present

## 2015-09-11 DIAGNOSIS — Z792 Long term (current) use of antibiotics: Secondary | ICD-10-CM | POA: Diagnosis not present

## 2015-09-11 DIAGNOSIS — J45909 Unspecified asthma, uncomplicated: Secondary | ICD-10-CM | POA: Insufficient documentation

## 2015-09-11 DIAGNOSIS — R21 Rash and other nonspecific skin eruption: Secondary | ICD-10-CM | POA: Diagnosis present

## 2015-09-11 DIAGNOSIS — I10 Essential (primary) hypertension: Secondary | ICD-10-CM | POA: Diagnosis not present

## 2015-09-11 DIAGNOSIS — Z79899 Other long term (current) drug therapy: Secondary | ICD-10-CM | POA: Insufficient documentation

## 2015-09-11 DIAGNOSIS — Z7982 Long term (current) use of aspirin: Secondary | ICD-10-CM | POA: Insufficient documentation

## 2015-09-11 DIAGNOSIS — L03317 Cellulitis of buttock: Secondary | ICD-10-CM | POA: Diagnosis not present

## 2015-09-11 MED ORDER — DOXYCYCLINE HYCLATE 100 MG PO CAPS: 100.0000 mg | ORAL_CAPSULE | Freq: Two times a day (BID) | ORAL | Status: DC

## 2015-09-11 NOTE — ED Provider Notes (Signed)
CSN: [id number]     Arrival date & time 09/11/15  1404 History  By signing my name below, I, Nicole Kindred, attest that this documentation has been prepared under the direction and in the presence of Gloriann Loan, PA-C.  Electronically Signed: Nicole Kindred, ED Scribe 09/11/2015 at 4:18 PM.    Chief Complaint  Patient presents with  . Recurrent Skin Infections    The history is provided by the patient. No language interpreter was used.   HPI Comments: Heidi [name] is a 65 y.o. female with PMHx of HTN, asthma, and DM who presents to the Emergency Department complaining of gradual onset, constant right buttock pain, onset about three days ago. Pt reports an associated boil that began after she placed alcohol on the area.  Pt states she has used alcohol on the area on her buttock with no relief to symptoms. Pt denies purulent drainage, fever, chills, N/V or any other pertinent symptoms. Pt says that she recently switched medication for her diabetes. Pt currently takes hydrocodone for her pain.   Past Medical History  Diagnosis Date  . Asthma   . Hypertension   . Diabetes mellitus without complication [facility])    Past Surgical History  Procedure Laterality Date  . Abdominal hysterectomy     Family History  Problem Relation Age of Onset  . Colon cancer Father   . Diabetes type II Sister   . Diabetes type II Brother   . Colon cancer Sister    Social History  Substance Use Topics  . Smoking status: Never Smoker   . Smokeless tobacco: None  . Alcohol Use: No   OB History    No data available     Review of Systems  Constitutional: Positive for chills. Negative for fever.  Gastrointestinal: Negative for nausea, vomiting and abdominal pain.  Skin:       Boil noted on right buttock.   All other systems reviewed and are negative.    Allergies  Review of patient's allergies indicates no known allergies.  Home Medications   Prior to Admission medications   Medication Sig  Start Date End Date Taking? Authorizing Provider  albuterol (PROVENTIL HFA;VENTOLIN HFA) 108 (90 BASE) MCG/ACT inhaler Inhale 2 puffs into the lungs every 6 (six) hours as needed for wheezing or shortness of breath. 12/31/14   Arnoldo Morale, MD  albuterol (PROVENTIL) (2.5 MG/3ML) 0.083% nebulizer solution INHALE CONTENTS OF 1 VIAL VIA NEBULIZER EVERY 6 HOURS AS NEEDED FOR WHEEZING OR SHORTNESS OF BREATH 07/10/15   Josalyn Funches, MD  Ascorbic Acid (VITAMIN C PO) Take 1 tablet by mouth daily.    Historical Provider, MD  aspirin 81 MG chewable tablet Chew 81 mg by mouth daily.     Historical Provider, MD  atenolol (TENORMIN) 50 MG tablet Take 1 tablet (50 mg total) by mouth 2 (two) times daily. 02/11/15   Josalyn Funches, MD  atorvastatin (LIPITOR) 40 MG tablet Take 40 mg by mouth every evening. 06/16/15   Historical Provider, MD  bumetanide (BUMEX) 0.5 MG tablet TAKE 1 TABLET BY MOUTH EVERY DAY 08/16/15   Boykin Nearing, MD  calcium-vitamin D (OSCAL WITH D) 500-200 MG-UNIT per tablet Take 1 tablet by mouth daily. 12/18/14   Arnoldo Morale, MD  doxycycline (VIBRAMYCIN) 100 MG capsule Take 1 capsule (100 mg total) by mouth 2 (two) times daily. 09/11/15   Gloriann Loan, PA-C  Fluticasone-Salmeterol (ADVAIR DISKUS) 500-50 MCG/DOSE AEPB Inhale 1 puff into the lungs 2 (two) times daily. 04/15/15  Josalyn Funches, MD  glucose blood (FREESTYLE TEST STRIPS) test strip Use as instructed 12/31/14   Arnoldo Morale, MD  hydrochlorothiazide (HYDRODIURIL) 25 MG tablet Take 1 tablet (25 mg total) by mouth daily. 05/08/15   Boykin Nearing, MD  HYDROcodone-acetaminophen (NORCO) 7.5-325 MG per tablet Take 1 tablet by mouth every 6 (six) hours as needed for moderate pain or severe pain.  11/19/14   Historical Provider, MD  insulin aspart (NOVOLOG) 100 UNIT/ML injection Inject 40 Units into the skin 3 (three) times daily with meals. 09/06/15   Josalyn Funches, MD  Insulin Syringe-Needle U-100 (INSULIN SYRINGE 1CC/28G) 28G X 1/2" 1 ML MISC 1  each by Does not apply route 3 (three) times daily. 02/11/15   Boykin Nearing, MD  Lancets (FREESTYLE) lancets Use as instructed 12/11/14   Arnoldo Morale, MD  lidocaine (XYLOCAINE) 5 % ointment Apply 1 application topically 4 (four) times daily as needed. Patient not taking: Reported on 08/29/2015 06/04/15   Boykin Nearing, MD  lisinopril (PRINIVIL,ZESTRIL) 5 MG tablet Take 1 tablet (5 mg total) by mouth daily. 05/08/15   Josalyn Funches, MD  montelukast (SINGULAIR) 10 MG tablet Take 1 tablet (10 mg total) by mouth daily. Patient not taking: Reported on 08/29/2015 05/08/15   Boykin Nearing, MD  Multiple Vitamin (MULTIVITAMIN WITH MINERALS) TABS tablet Take 1 tablet by mouth daily.    Historical Provider, MD  nitroGLYCERIN (NITROSTAT) 0.3 MG SL tablet Place 0.3 mg under the tongue every 5 (five) minutes as needed for chest pain.     Historical Provider, MD  omega-3 acid ethyl esters (LOVAZA) 1 G capsule Take 1 capsule (1,000 mg total) by mouth 2 (two) times daily. 05/08/15   Josalyn Funches, MD  omeprazole (PRILOSEC) 40 MG capsule TAKE 1 CAPSULE BY MOUTH DAILY 08/15/15   Josalyn Funches, MD  potassium chloride (K-DUR) 10 MEQ tablet TAKE 1 TABLET(10 MEQ) BY MOUTH DAILY 03/15/15   Boykin Nearing, MD  pregabalin (LYRICA) 50 MG capsule Take 50 mg by mouth 2 (two) times daily. 07/30/15   Historical Provider, MD  topiramate (TOPAMAX) 50 MG tablet Take 50 mg by mouth 2 (two) times daily. 11/01/14   Historical Provider, MD    Physical Exam  Constitutional: She is oriented to person, place, and time. She appears well-developed and well-nourished.  HENT:  Head: Normocephalic and atraumatic.  Right Ear: External ear normal.  Left Ear: External ear normal.  Eyes: Conjunctivae are normal. No scleral icterus.  Neck: No tracheal deviation present.  Pulmonary/Chest: Effort normal. No respiratory distress.  Abdominal: She exhibits no distension.  Musculoskeletal: Normal range of motion.  Neurological: She is alert and  oriented to person, place, and time.  Skin: Skin is warm and dry.  1x2 cm area of erythema and coalescing indurated pustules without fluctuance on left buttock.   Psychiatric: She has a normal mood and affect. Her behavior is normal.  Vitals reviewed.   ED Course  Procedures (including critical care time) Labs Review DIAGNOSTIC STUDIES: Oxygen Saturation is 98% on RA, normal by my interpretation.    COORDINATION OF CARE: 4:17 PM-Discussed treatment plan which includes doxycycline, symptom monitoring, and warm compress with pt at bedside and pt agreed to plan.    Labs Reviewed - No data to display  Imaging Review No results found.   EKG Interpretation None      MDM   Final diagnoses:  Cellulitis of buttock   VSS, NAD.  No systemic symptoms.  Findings consistent with cellulitis.  No fluctuance to  suggest abscess. No indication for I&D.  Plan to discharge home with Doxycycline.  Recommend warm compresses and prescribed hydrocodone for pain control.  Follow up PCP in 2 days for recheck.  Discussed importance of glucose monitoring and control in regards to recurrent skin infections.  Discussed return precautions including increasing erythema, warmth, worsening pain, fever, N/V.  Patient agrees and acknowledges the above plan for discharge.  I personally performed the services described in this documentation, which was scribed in my presence. The recorded information has been reviewed and is accurate.      Gloriann Loan, PA-C 09/11/15 Andrew, MD 09/11/15 2041

## 2015-09-11 NOTE — ED Notes (Signed)
Patient ambulating in hall with cane.  Gait steady and even.

## 2015-09-11 NOTE — Telephone Encounter (Signed)
error 

## 2015-09-11 NOTE — ED Notes (Signed)
Patient able to ambulate independently  

## 2015-09-11 NOTE — Discharge Instructions (Signed)

## 2015-09-11 NOTE — ED Notes (Signed)
Pt reports to the ED for eval of abscess to buttocks. Pt reports she has been placing rubbing ETOH on it but she is still having pain. Pt denies any interference with her BMs or fevers. Pt A&Ox4, resp e/u, and skin warm and dry.

## 2015-09-14 ENCOUNTER — Other Ambulatory Visit: Payer: Self-pay | Admitting: Family Medicine

## 2015-09-16 NOTE — Telephone Encounter (Signed)
Pt. Called stating that she called her pharmacy and they stated they have not received the Rx for bumetanide (BUMEX) 0.5 MG tablet. Pt stated she needs the medication. Please f/u

## 2015-09-17 ENCOUNTER — Other Ambulatory Visit: Payer: Self-pay | Admitting: Family Medicine

## 2015-09-17 DIAGNOSIS — R928 Other abnormal and inconclusive findings on diagnostic imaging of breast: Secondary | ICD-10-CM

## 2015-09-18 ENCOUNTER — Other Ambulatory Visit: Payer: Self-pay | Admitting: Family Medicine

## 2015-09-18 DIAGNOSIS — R928 Other abnormal and inconclusive findings on diagnostic imaging of breast: Secondary | ICD-10-CM

## 2015-09-19 ENCOUNTER — Telehealth: Payer: Self-pay | Admitting: Family Medicine

## 2015-09-19 ENCOUNTER — Other Ambulatory Visit: Payer: Self-pay | Admitting: Family Medicine

## 2015-09-19 NOTE — Telephone Encounter (Signed)
Called patient  Verified name and DOB She needed PA for novolog. She decided to stay on humagolo 75/25.  she is still taking humalog 75/25 80 U TID although it was prescribed BID She has an appt with me tomorrow.   Plan: DC novolog, called her pharmacy  Will discuss regimen with patient in the office along with the clinical pharmacologist

## 2015-09-20 ENCOUNTER — Ambulatory Visit: Payer: Medicare Other | Attending: Family Medicine | Admitting: Family Medicine

## 2015-09-20 ENCOUNTER — Encounter: Payer: Self-pay | Admitting: Family Medicine

## 2015-09-20 VITALS — BP 119/75 | HR 67 | Temp 97.5°F | Resp 15 | Ht 63.0 in | Wt 240.0 lb

## 2015-09-20 DIAGNOSIS — E1165 Type 2 diabetes mellitus with hyperglycemia: Secondary | ICD-10-CM | POA: Diagnosis not present

## 2015-09-20 DIAGNOSIS — Z79899 Other long term (current) drug therapy: Secondary | ICD-10-CM | POA: Diagnosis not present

## 2015-09-20 DIAGNOSIS — K219 Gastro-esophageal reflux disease without esophagitis: Secondary | ICD-10-CM

## 2015-09-20 DIAGNOSIS — Z794 Long term (current) use of insulin: Secondary | ICD-10-CM | POA: Diagnosis not present

## 2015-09-20 DIAGNOSIS — E1122 Type 2 diabetes mellitus with diabetic chronic kidney disease: Secondary | ICD-10-CM

## 2015-09-20 DIAGNOSIS — N182 Chronic kidney disease, stage 2 (mild): Secondary | ICD-10-CM

## 2015-09-20 DIAGNOSIS — I5033 Acute on chronic diastolic (congestive) heart failure: Secondary | ICD-10-CM

## 2015-09-20 DIAGNOSIS — E785 Hyperlipidemia, unspecified: Secondary | ICD-10-CM | POA: Diagnosis not present

## 2015-09-20 DIAGNOSIS — IMO0002 Reserved for concepts with insufficient information to code with codable children: Secondary | ICD-10-CM

## 2015-09-20 DIAGNOSIS — Z7982 Long term (current) use of aspirin: Secondary | ICD-10-CM | POA: Insufficient documentation

## 2015-09-20 LAB — GLUCOSE, POCT (MANUAL RESULT ENTRY): POC Glucose: 205 mg/dl — AB (ref 70–99)

## 2015-09-20 MED ORDER — FREESTYLE LANCETS MISC: 1.0000 | Freq: Three times a day (TID) | Status: DC

## 2015-09-20 MED ORDER — OMEGA-3-ACID ETHYL ESTERS 1 G PO CAPS: 1000.0000 mg | ORAL_CAPSULE | Freq: Two times a day (BID) | ORAL | Status: DC

## 2015-09-20 MED ORDER — OMEPRAZOLE 40 MG PO CPDR: 40.0000 mg | DELAYED_RELEASE_CAPSULE | Freq: Every day | ORAL | Status: DC

## 2015-09-20 MED ORDER — ATENOLOL 50 MG PO TABS: 50.0000 mg | ORAL_TABLET | Freq: Two times a day (BID) | ORAL | Status: DC

## 2015-09-20 MED ORDER — INSULIN LISPRO PROT & LISPRO (75-25 MIX) 100 UNIT/ML ~~LOC~~ SUSP: 80.0000 [IU] | Freq: Two times a day (BID) | SUBCUTANEOUS | Status: DC

## 2015-09-20 MED ORDER — CALCIUM CARBONATE-VITAMIN D 500-200 MG-UNIT PO TABS: 1.0000 | ORAL_TABLET | Freq: Every day | ORAL | Status: DC

## 2015-09-20 NOTE — Progress Notes (Signed)
Subjective:  Patient ID: Heidi [name], female    DOB: [birth year]  Age: 65 y.o. MRN: [id number]  CC: Follow-up and Diabetes   HPI Heidi [name] presents for    1. CHRONIC DIABETES  Disease Monitoring  Blood Sugar Ranges: 50-400  Polyuria: no   Visual problems: no   Medication Compliance: yes, but taking 75/25 TID, 80 U despite being advised to take BID only and running out of insulin early. Insurance will not cover [place]. She prefers not to take metformin. Insurance would not cover Humalog.   Medication Side Effects  Hypoglycemia: yes   Preventitive Health Care  Eye Exam: due   Foot Exam: done   Diet pattern: eating lower carbs   Exercise: yes   Med; refills. Requesting printed refills.  Social History  Substance Use Topics  . Smoking status: Never Smoker   . Smokeless tobacco: Not on file  . Alcohol Use: No    Outpatient Prescriptions Prior to Visit  Medication Sig Dispense Refill  . albuterol (PROVENTIL HFA;VENTOLIN HFA) 108 (90 BASE) MCG/ACT inhaler Inhale 2 puffs into the lungs every 6 (six) hours as needed for wheezing or shortness of breath. 1 Inhaler 3  . albuterol (PROVENTIL) (2.5 MG/3ML) 0.083% nebulizer solution INHALE CONTENTS OF 1 VIAL VIA NEBULIZER EVERY 6 HOURS AS NEEDED FOR WHEEZING OR SHORTNESS OF BREATH 450 mL 1  . Ascorbic Acid (VITAMIN C PO) Take 1 tablet by mouth daily.    Marland Kitchen aspirin 81 MG chewable tablet Chew 81 mg by mouth daily.     Marland Kitchen atenolol (TENORMIN) 50 MG tablet Take 1 tablet (50 mg total) by mouth 2 (two) times daily. 60 tablet 5  . atorvastatin (LIPITOR) 40 MG tablet Take 40 mg by mouth every evening.  2  . bumetanide (BUMEX) 0.5 MG tablet TAKE 1 TABLET BY MOUTH EVERY DAY 30 tablet 5  . calcium-vitamin D (OSCAL WITH D) 500-200 MG-UNIT per tablet Take 1 tablet by mouth daily. 30 tablet 2  . doxycycline (VIBRAMYCIN) 100 MG capsule Take 1 capsule (100 mg total) by mouth 2 (two) times daily. 20 capsule 0  . Fluticasone-Salmeterol (ADVAIR  DISKUS) 500-50 MCG/DOSE AEPB Inhale 1 puff into the lungs 2 (two) times daily. 60 each 5  . glucose blood (FREESTYLE TEST STRIPS) test strip Use as instructed 100 each 12  . hydrochlorothiazide (HYDRODIURIL) 25 MG tablet Take 1 tablet (25 mg total) by mouth daily. 90 tablet 3  . HYDROcodone-acetaminophen (NORCO) 7.5-325 MG per tablet Take 1 tablet by mouth every 6 (six) hours as needed for moderate pain or severe pain.   0  . insulin lispro protamine-lispro (HUMALOG 75/25 MIX) (75-25) 100 UNIT/ML SUSP injection Inject 80 Units into the skin 3 (three) times daily. Patient taking TID although med prescribed BID    . Insulin Syringe-Needle U-100 (INSULIN SYRINGE 1CC/28G) 28G X 1/2" 1 ML MISC 1 each by Does not apply route 3 (three) times daily. 90 each 11  . Lancets (FREESTYLE) lancets Use as instructed 100 each 12  . lidocaine (XYLOCAINE) 5 % ointment Apply 1 application topically 4 (four) times daily as needed. 744.24 g 3  . lisinopril (PRINIVIL,ZESTRIL) 5 MG tablet Take 1 tablet (5 mg total) by mouth daily. 90 tablet 3  . montelukast (SINGULAIR) 10 MG tablet Take 1 tablet (10 mg total) by mouth daily. 90 tablet 3  . Multiple Vitamin (MULTIVITAMIN WITH MINERALS) TABS tablet Take 1 tablet by mouth daily.    . nitroGLYCERIN (NITROSTAT) 0.3 MG SL tablet  Place 0.3 mg under the tongue every 5 (five) minutes as needed for chest pain.     Marland Kitchen omega-3 acid ethyl esters (LOVAZA) 1 G capsule Take 1 capsule (1,000 mg total) by mouth 2 (two) times daily. 180 capsule 3  . omeprazole (PRILOSEC) 40 MG capsule TAKE 1 CAPSULE BY MOUTH DAILY 30 capsule 2  . potassium chloride (K-DUR) 10 MEQ tablet TAKE 1 TABLET(10 MEQ) BY MOUTH DAILY 90 tablet 2  . pregabalin (LYRICA) 50 MG capsule Take 50 mg by mouth 2 (two) times daily.    Marland Kitchen topiramate (TOPAMAX) 50 MG tablet Take 50 mg by mouth 2 (two) times daily.  2   No facility-administered medications prior to visit.    ROS Review of Systems  Constitutional: Negative for  fever and chills.  HENT: Negative for congestion, dental problem and trouble swallowing.   Eyes: Negative for visual disturbance.  Respiratory: Negative for cough and shortness of breath.   Cardiovascular: Negative for chest pain.  Gastrointestinal: Negative for abdominal pain and blood in stool.  Musculoskeletal: Negative for back pain and arthralgias.  Skin: Negative for rash.  Allergic/Immunologic: Negative for immunocompromised state.  Hematological: Negative for adenopathy. Does not bruise/bleed easily.  Psychiatric/Behavioral: Negative for suicidal ideas and dysphoric mood.    Objective:  BP 119/75 mmHg  Pulse 67  Temp(Src) 97.5 F (36.4 C)  Resp 15  Ht 5\' 3"  (1.6 m)  Wt 240 lb (108.863 kg)  BMI 42.52 kg/m2  SpO2 96%  BP/Weight 09/20/2015 09/11/2015 [id number]  Systolic BP 123456 [id number]  Diastolic BP 75 61 89  Wt. (Lbs) 240 - -  BMI 42.52 - -   Wt Readings from Last 3 Encounters:  09/20/15 240 lb (108.863 kg)  07/29/15 241 lb 8 oz (109.544 kg)  07/23/15 240 lb (108.863 kg)     Physical Exam  Constitutional: She is oriented to person, place, and time. She appears well-developed and well-nourished. No distress.  HENT:  Head: Normocephalic and atraumatic.  Cardiovascular: Normal rate, regular rhythm, normal heart sounds and intact distal pulses.   Pulmonary/Chest: Effort normal and breath sounds normal.  Abdominal: Soft. Bowel sounds are normal.  Truncal obesity   Musculoskeletal: She exhibits no edema.  Neurological: She is alert and oriented to person, place, and time.  Skin: Skin is warm and dry. No rash noted.  Psychiatric: She has a normal mood and affect.   Lab Results  Component Value Date   HGBA1C 12.50 07/10/2015   CBG 205  Assessment & Plan:   Heidi [name] was seen today for follow-up and diabetes.  Diagnoses and all orders for this visit:  Type 2 diabetes mellitus with hyperglycemia, with long-term current use of insulin (HCC) -     Glucose (CBG) -      insulin lispro protamine-lispro (HUMALOG 75/25 MIX) (75-25) 100 UNIT/ML SUSP injection; Inject 80 Units into the skin 2 (two) times daily with a meal. Patient taking TID although med prescribed BID -     Lancets (FREESTYLE) lancets; 1 each by Other route 3 (three) times daily. Use as instructed  HLD (hyperlipidemia) -     omega-3 acid ethyl esters (LOVAZA) 1 g capsule; Take 1 capsule (1,000 mg total) by mouth 2 (two) times daily.  Acute on chronic diastolic congestive heart failure (HCC) -     atenolol (TENORMIN) 50 MG tablet; Take 1 tablet (50 mg total) by mouth 2 (two) times daily.  Gastroesophageal reflux disease, esophagitis presence not specified -  omeprazole (PRILOSEC) 40 MG capsule; Take 1 capsule (40 mg total) by mouth daily.  Other orders -     calcium-vitamin D (OSCAL WITH D) 500-200 MG-UNIT tablet; Take 1 tablet by mouth daily.    No orders of the defined types were placed in this encounter.    Follow-up: No Follow-up on file.   Boykin Nearing MD

## 2015-09-20 NOTE — Progress Notes (Signed)
Patient is here for follow up on her DM She needs a refill on lancets, omeprazole, and calcium/vit D She prefers to have prescriptions printed She has some other concerns but patient prefers to keep between the MD and herself She is checking her sugar tid

## 2015-09-20 NOTE — Patient Instructions (Addendum)
Heidi [name] was seen today for follow-up and diabetes.  Diagnoses and all orders for this visit:  Type 2 diabetes mellitus with hyperglycemia, with long-term current use of insulin (HCC) -     Glucose (CBG) -     insulin lispro protamine-lispro (HUMALOG 75/25 MIX) (75-25) 100 UNIT/ML SUSP injection; Inject 80 Units into the skin 2 (two) times daily with a meal. Patient taking TID although med prescribed BID -     Lancets (FREESTYLE) lancets; 1 each by Other route 3 (three) times daily. Use as instructed  HLD (hyperlipidemia) -     omega-3 acid ethyl esters (LOVAZA) 1 g capsule; Take 1 capsule (1,000 mg total) by mouth 2 (two) times daily.  Acute on chronic diastolic congestive heart failure (HCC) -     atenolol (TENORMIN) 50 MG tablet; Take 1 tablet (50 mg total) by mouth 2 (two) times daily.  Gastroesophageal reflux disease, esophagitis presence not specified -     omeprazole (PRILOSEC) 40 MG capsule; Take 1 capsule (40 mg total) by mouth daily.  Other orders -     calcium-vitamin D (OSCAL WITH D) 500-200 MG-UNIT tablet; Take 1 tablet by mouth daily.   F/u with clinical pharmacologist in 3 weeks  F/u with me in 6 weeks   Dr. Adrian Blackwater

## 2015-09-22 NOTE — Assessment & Plan Note (Signed)
A: remains uncontrolled. Regimen was discussed with patient and clinical pharmacologist P: Continue humalog 75/25, patient agrees to BID dosing  No metformin for now

## 2015-09-23 ENCOUNTER — Other Ambulatory Visit: Payer: Self-pay

## 2015-09-23 ENCOUNTER — Other Ambulatory Visit: Payer: Self-pay | Admitting: Family Medicine

## 2015-09-23 DIAGNOSIS — R928 Other abnormal and inconclusive findings on diagnostic imaging of breast: Secondary | ICD-10-CM

## 2015-09-24 ENCOUNTER — Ambulatory Visit
Admission: RE | Admit: 2015-09-24 | Discharge: 2015-09-24 | Disposition: A | Discharge status: Home / Self Care | Payer: Medicare Other | Source: Ambulatory Visit | Attending: Family Medicine | Admitting: Family Medicine

## 2015-09-24 DIAGNOSIS — R928 Other abnormal and inconclusive findings on diagnostic imaging of breast: Secondary | ICD-10-CM

## 2015-10-01 DIAGNOSIS — G43709 Chronic migraine without aura, not intractable, without status migrainosus: Secondary | ICD-10-CM | POA: Diagnosis not present

## 2015-10-10 ENCOUNTER — Encounter: Payer: Self-pay | Admitting: Pharmacist

## 2015-10-10 ENCOUNTER — Ambulatory Visit: Payer: Medicare Other | Attending: Family Medicine | Admitting: Pharmacist

## 2015-10-10 DIAGNOSIS — Z794 Long term (current) use of insulin: Secondary | ICD-10-CM | POA: Diagnosis not present

## 2015-10-10 DIAGNOSIS — E1165 Type 2 diabetes mellitus with hyperglycemia: Secondary | ICD-10-CM

## 2015-10-10 MED ORDER — INSULIN LISPRO PROT & LISPRO (75-25 MIX) 100 UNIT/ML ~~LOC~~ SUSP: 80.0000 [IU] | Freq: Two times a day (BID) | SUBCUTANEOUS | Status: DC

## 2015-10-10 NOTE — Patient Instructions (Signed)
Thanks for coming to see [place]!  You are doing a great job - keep taking your insulin twice a day - it is really working for you!\  I sent refills on your insulin to [facility]  Follow up with Dr. Adrian Blackwater in 3 weeks

## 2015-10-10 NOTE — Progress Notes (Signed)
S:    Patient arrives in good spirits.  Presents for diabetes evaluation, education, and management at the request of Dr. Adrian Blackwater. Patient was referred on 09/20/15.  Patient was last seen by Primary Care Provider on 09/20/15.   Patient reports adherence with medications. However, she ran out of her insulin a day ago.   Current diabetes medications include: Humalog 75/25 80 units BID - patient used to take it TID but was instructed at her last visit not to do this due to stacking of the insulin.  Patient denies hypoglycemic events.  Patient reported exercise habits: none   Patient denies nocturia.  Patient denies neuropathy. Patient denies visual changes. Patient reports self foot exams.    O:  Lab Results  Component Value Date   HGBA1C 12.50 07/10/2015   There were no vitals filed for this visit.  Home fasting CBG: 100s-120s 2 hour post-prandial/random CBG: 150s-190s  Had readings of 200s-300s yesterday because she was out of her insulin   A/P: Diabetes longstanding/newly diagnosed currently uncontrolled based on A1c of 12.5 but much improved based on home CBGs. Patient denies hypoglycemic events and is able to verbalize appropriate hypoglycemia management plan. Patient reports adherence with medication. Control is suboptimal due to dietary indiscretion and sedentary lifestyle.  Continue Humalog 75/25 80 units BID. Instructed patient to not take the insulin TID like she has in the past and to continue to take all of her medications as prescribed. Also encouraged patient to not run out of her medications and to get all of her refills on time. Patient to follow up with Dr. Adrian Blackwater in 3 weeks.  Next A1C anticipated April 2017.    Written patient instructions provided.  Total time in face to face counseling 20 minutes.   Follow up in Pharmacist Clinic Visit PRN, next visit with Dr. Adrian Blackwater.   Patient seen with Jamie Brookes, PharmD Candidate

## 2015-10-22 ENCOUNTER — Telehealth: Payer: Self-pay | Admitting: Family Medicine

## 2015-10-22 NOTE — Telephone Encounter (Signed)
Patient is stating that breathing machine and oxygen tanks orders were suppose to be sent to [facility] by PCP who she spoke to on her last visit with the pharmacist.  Please follow up with patient in regards to the status of these orders

## 2015-10-28 NOTE — Telephone Encounter (Signed)
Called back to patient to clarify request Left VM requesting a call back   I do recall that she has a question about her home O2 at her last pharmacy visit and my RMA ambulated the patient and checked her O2 sats which her normal. Unfortunately, her question and the sats were not documented.   I have written and order for home O2 to be discontinued.

## 2015-10-31 ENCOUNTER — Ambulatory Visit: Payer: Medicare Other | Admitting: Family Medicine

## 2015-11-09 ENCOUNTER — Other Ambulatory Visit: Payer: Self-pay | Admitting: Family Medicine

## 2015-11-11 DIAGNOSIS — G959 Disease of spinal cord, unspecified: Secondary | ICD-10-CM | POA: Diagnosis not present

## 2015-11-11 DIAGNOSIS — I2510 Atherosclerotic heart disease of native coronary artery without angina pectoris: Secondary | ICD-10-CM | POA: Diagnosis not present

## 2015-11-11 DIAGNOSIS — Q289 Congenital malformation of circulatory system, unspecified: Secondary | ICD-10-CM | POA: Diagnosis not present

## 2015-11-14 ENCOUNTER — Other Ambulatory Visit (HOSPITAL_COMMUNITY)
Admission: RE | Admit: 2015-11-14 | Discharge: 2015-11-14 | Disposition: A | Discharge status: Home / Self Care | Payer: Medicare Other | Source: Ambulatory Visit | Attending: Family Medicine | Admitting: Family Medicine

## 2015-11-14 ENCOUNTER — Ambulatory Visit: Payer: Medicare Other | Attending: Family Medicine | Admitting: Family Medicine

## 2015-11-14 ENCOUNTER — Encounter: Payer: Self-pay | Admitting: Family Medicine

## 2015-11-14 VITALS — BP 108/67 | HR 69 | Temp 98.6°F | Resp 16 | Ht 63.0 in | Wt 227.0 lb

## 2015-11-14 DIAGNOSIS — I10 Essential (primary) hypertension: Secondary | ICD-10-CM

## 2015-11-14 DIAGNOSIS — Z7982 Long term (current) use of aspirin: Secondary | ICD-10-CM | POA: Diagnosis not present

## 2015-11-14 DIAGNOSIS — K219 Gastro-esophageal reflux disease without esophagitis: Secondary | ICD-10-CM | POA: Diagnosis not present

## 2015-11-14 DIAGNOSIS — E1122 Type 2 diabetes mellitus with diabetic chronic kidney disease: Secondary | ICD-10-CM

## 2015-11-14 DIAGNOSIS — Z113 Encounter for screening for infections with a predominantly sexual mode of transmission: Secondary | ICD-10-CM

## 2015-11-14 DIAGNOSIS — N952 Postmenopausal atrophic vaginitis: Secondary | ICD-10-CM

## 2015-11-14 DIAGNOSIS — I5033 Acute on chronic diastolic (congestive) heart failure: Secondary | ICD-10-CM | POA: Diagnosis not present

## 2015-11-14 DIAGNOSIS — N182 Chronic kidney disease, stage 2 (mild): Secondary | ICD-10-CM

## 2015-11-14 DIAGNOSIS — Z79899 Other long term (current) drug therapy: Secondary | ICD-10-CM | POA: Diagnosis not present

## 2015-11-14 DIAGNOSIS — N760 Acute vaginitis: Secondary | ICD-10-CM | POA: Diagnosis not present

## 2015-11-14 DIAGNOSIS — Z794 Long term (current) use of insulin: Secondary | ICD-10-CM | POA: Insufficient documentation

## 2015-11-14 DIAGNOSIS — I129 Hypertensive chronic kidney disease with stage 1 through stage 4 chronic kidney disease, or unspecified chronic kidney disease: Secondary | ICD-10-CM | POA: Diagnosis not present

## 2015-11-14 DIAGNOSIS — Z1159 Encounter for screening for other viral diseases: Secondary | ICD-10-CM

## 2015-11-14 DIAGNOSIS — E1165 Type 2 diabetes mellitus with hyperglycemia: Secondary | ICD-10-CM | POA: Diagnosis not present

## 2015-11-14 LAB — HEMOGLOBIN A1C
Hgb A1c MFr Bld: 11 % — ABNORMAL HIGH (ref <5.7)
Mean Plasma Glucose: 269 mg/dL

## 2015-11-14 LAB — GLUCOSE, POCT (MANUAL RESULT ENTRY): POC Glucose: 113 mg/dl — AB (ref 70–99)

## 2015-11-14 MED ORDER — ESTRADIOL 10 MCG VA TABS: ORAL_TABLET | VAGINAL | Status: DC

## 2015-11-14 MED ORDER — ATENOLOL 25 MG PO TABS: 25.0000 mg | ORAL_TABLET | Freq: Two times a day (BID) | ORAL | Status: DC

## 2015-11-14 MED ORDER — LISINOPRIL 5 MG PO TABS: 5.0000 mg | ORAL_TABLET | Freq: Every day | ORAL | Status: DC

## 2015-11-14 MED ORDER — INSULIN LISPRO PROT & LISPRO (75-25 MIX) 100 UNIT/ML ~~LOC~~ SUSP: SUBCUTANEOUS | Status: DC

## 2015-11-14 MED ORDER — POTASSIUM CHLORIDE ER 10 MEQ PO TBCR: 10.0000 meq | EXTENDED_RELEASE_TABLET | Freq: Every day | ORAL | Status: DC

## 2015-11-14 MED ORDER — OMEPRAZOLE 40 MG PO CPDR: 40.0000 mg | DELAYED_RELEASE_CAPSULE | Freq: Every day | ORAL | Status: DC

## 2015-11-14 NOTE — Assessment & Plan Note (Signed)
Add vaginal estrogen

## 2015-11-14 NOTE — Assessment & Plan Note (Signed)
Overly treated with intermittent dizziness  Plan: Stop Demadex and decrease atenolol from 50 to 25 mg BID, she reluctantly agrees Start pack on lisinopril 5 mg daily  Continue low dose potassium per her request

## 2015-11-14 NOTE — Patient Instructions (Addendum)
Heidi [name] was seen today for diabetes.  Diagnoses and all orders for this visit:  Type 2 diabetes mellitus with stage 2 chronic kidney disease, unspecified long term insulin use status (HCC) -     POCT glucose (manual entry) -     Hemoglobin A1c -     insulin lispro protamine-lispro (HUMALOG 75/25 MIX) (75-25) 100 UNIT/ML SUSP injection; Take twice daily based on sliding scale, 30 U for pre meal CBG 100-150, 40 U for 151-170, 50 U for 171-190, 60  U for > 191 -     lisinopril (PRINIVIL,ZESTRIL) 5 MG tablet; Take 1 tablet (5 mg total) by mouth daily.  Acute on chronic diastolic congestive heart failure (HCC) -     atenolol (TENORMIN) 25 MG tablet; Take 1 tablet (25 mg total) by mouth 2 (two) times daily. -     potassium chloride (K-DUR) 10 MEQ tablet; Take 1 tablet (10 mEq total) by mouth daily.  Gastroesophageal reflux disease, esophagitis presence not specified -     omeprazole (PRILOSEC) 40 MG capsule; Take 1 capsule (40 mg total) by mouth daily.  Essential hypertension -     atenolol (TENORMIN) 25 MG tablet; Take 1 tablet (25 mg total) by mouth 2 (two) times daily. -     potassium chloride (K-DUR) 10 MEQ tablet; Take 1 tablet (10 mEq total) by mouth daily. -     lisinopril (PRINIVIL,ZESTRIL) 5 MG tablet; Take 1 tablet (5 mg total) by mouth daily.  Routine screening for STI (sexually transmitted infection) -     RPR -     Hepatitis C antibody, reflex -     HIV antibody (with reflex) -     Urine cytology ancillary only  Post-menopausal atrophic vaginitis -     Estradiol 10 MCG TABS vaginal tablet; Insert 1 tablet (10 mcg) once daily for 2 weeks; Maintenance: Insert 1 tablet twice weekly   Great job with weight loss and blood sugar and blood pressure control  Sliding scale for humalog 75/25  Add back lisinopril Stop bumex Decrease atenolol from 50 to 25 mg twice daily    F/u in 6 weeks for HTN, f/u sooner if needed   Dr. Adrian Blackwater

## 2015-11-14 NOTE — Progress Notes (Signed)
F/U DM  Stated not taking Lisinopril x1 week due to dizziness No dizziness since lisinopril was discontinue  No pain today  Glucose running 40-155 No tobacco user

## 2015-11-14 NOTE — Progress Notes (Signed)
Subjective:  Patient ID: Heidi [name], female    DOB: 11/13/1950  Age: 65 y.o. MRN: [id number]  CC: Diabetes   HPI Curtisa Helmkamp presents for   1. CHRONIC DIABETES  Disease Monitoring  Blood Sugar Ranges: 40-155  Polyuria: no   Visual problems: no   Medication Compliance: yes, taking 35-60 U of humalog 75/25   Medication Side Effects  Hypoglycemia: yes   Preventitive Health Care  Eye Exam: due   Foot Exam:   Diet pattern: eating low carb   Exercise: minimal   2. CHRONIC HYPERTENSION  Disease Monitoring  Blood pressure range: not checking   Chest pain: no   Dyspnea: no   Claudication: no   Medication compliance: no, stopped taking lisinopril due to dizziness   Medication Side Effects  Lightheadedness: yes   Urinary frequency: no   Edema: no   3. Recent sex: had unprotected sex after 88 years of no sex.  She did not use a lubricant. The sex was painful. She did not use a condom. Interested in treatment to reduce pain during sex.    Social History  Substance Use Topics  . Smoking status: Never Smoker   . Smokeless tobacco: Not on file  . Alcohol Use: No   Outpatient Prescriptions Prior to Visit  Medication Sig Dispense Refill  . albuterol (PROVENTIL HFA;VENTOLIN HFA) 108 (90 BASE) MCG/ACT inhaler Inhale 2 puffs into the lungs every 6 (six) hours as needed for wheezing or shortness of breath. 1 Inhaler 3  . albuterol (PROVENTIL) (2.5 MG/3ML) 0.083% nebulizer solution INHALE CONTENTS OF 1 VIAL VIA NEBULIZER EVERY 6 HOURS AS NEEDED FOR WHEEZING OR SHORTNESS OF BREATH 450 mL 1  . Ascorbic Acid (VITAMIN C PO) Take 1 tablet by mouth daily.    Marland Kitchen aspirin 81 MG chewable tablet Chew 81 mg by mouth daily.     Marland Kitchen atenolol (TENORMIN) 50 MG tablet Take 1 tablet (50 mg total) by mouth 2 (two) times daily. 60 tablet 11  . atorvastatin (LIPITOR) 40 MG tablet Take 40 mg by mouth every evening.  2  . bumetanide (BUMEX) 0.5 MG tablet TAKE 1 TABLET BY MOUTH EVERY DAY 30 tablet 5  .  calcium-vitamin D (OSCAL WITH D) 500-200 MG-UNIT tablet Take 1 tablet by mouth daily. 30 tablet 11  . Fluticasone-Salmeterol (ADVAIR DISKUS) 500-50 MCG/DOSE AEPB Inhale 1 puff into the lungs 2 (two) times daily. 60 each 5  . glucose blood (FREESTYLE TEST STRIPS) test strip Use as instructed 100 each 12  . hydrochlorothiazide (HYDRODIURIL) 25 MG tablet Take 1 tablet (25 mg total) by mouth daily. 90 tablet 3  . HYDROcodone-acetaminophen (NORCO) 7.5-325 MG per tablet Take 1 tablet by mouth every 6 (six) hours as needed for moderate pain or severe pain.   0  . insulin lispro protamine-lispro (HUMALOG 75/25 MIX) (75-25) 100 UNIT/ML SUSP injection Inject 80 Units into the skin 2 (two) times daily with a meal. 50 mL 5  . Insulin Syringe-Needle U-100 (INSULIN SYRINGE 1CC/28G) 28G X 1/2" 1 ML MISC 1 each by Does not apply route 3 (three) times daily. 90 each 11  . Lancets (FREESTYLE) lancets 1 each by Other route 3 (three) times daily. Use as instructed 100 each 12  . montelukast (SINGULAIR) 10 MG tablet Take 1 tablet (10 mg total) by mouth daily. 90 tablet 3  . Multiple Vitamin (MULTIVITAMIN WITH MINERALS) TABS tablet Take 1 tablet by mouth daily.    . nitroGLYCERIN (NITROSTAT) 0.3 MG SL tablet Place  0.3 mg under the tongue every 5 (five) minutes as needed for chest pain.     Marland Kitchen omeprazole (PRILOSEC) 40 MG capsule Take 1 capsule (40 mg total) by mouth daily. 30 capsule 11  . potassium chloride (K-DUR) 10 MEQ tablet TAKE 1 TABLET(10 MEQ) BY MOUTH DAILY 90 tablet 2  . topiramate (TOPAMAX) 50 MG tablet Take 50 mg by mouth 2 (two) times daily.  2  . lidocaine (XYLOCAINE) 5 % ointment Apply 1 application topically 4 (four) times daily as needed. (Patient not taking: Reported on 11/14/2015) 744.24 g 3  . lisinopril (PRINIVIL,ZESTRIL) 5 MG tablet Take 1 tablet (5 mg total) by mouth daily. (Patient not taking: Reported on 11/14/2015) 90 tablet 3  . omega-3 acid ethyl esters (LOVAZA) 1 g capsule Take 1 capsule (1,000 mg  total) by mouth 2 (two) times daily. (Patient not taking: Reported on 11/14/2015) 60 capsule 11  . pregabalin (LYRICA) 50 MG capsule Take 50 mg by mouth 2 (two) times daily. Reported on 11/14/2015     No facility-administered medications prior to visit.    ROS Review of Systems  Constitutional: Negative for fever and chills.  Eyes: Negative for visual disturbance.  Respiratory: Negative for shortness of breath.   Cardiovascular: Negative for chest pain.  Gastrointestinal: Negative for abdominal pain and blood in stool.  Musculoskeletal: Negative for back pain and arthralgias.  Skin: Negative for rash.  Allergic/Immunologic: Negative for immunocompromised state.  Neurological: Positive for dizziness.  Hematological: Negative for adenopathy. Does not bruise/bleed easily.  Psychiatric/Behavioral: Negative for suicidal ideas and dysphoric mood.    Objective:  BP 108/67 mmHg  Pulse 69  Temp(Src) 98.6 F (37 C) (Oral)  Resp 16  Ht 5\' 3"  (1.6 m)  Wt 227 lb (102.967 kg)  BMI 40.22 kg/m2  SpO2 96%  BP/Weight 11/14/2015 123XX123 Q000111Q  Systolic BP 123XX123 123456 [id number]  Diastolic BP 67 75 61  Wt. (Lbs) 227 240 -  BMI 40.22 42.52 -   Physical Exam  Constitutional: She is oriented to person, place, and time. She appears well-developed and well-nourished. No distress.  HENT:  Head: Normocephalic and atraumatic.  Cardiovascular: Normal rate, regular rhythm, normal heart sounds and intact distal pulses.   Pulmonary/Chest: Effort normal and breath sounds normal.  Musculoskeletal: She exhibits no edema.  Neurological: She is alert and oriented to person, place, and time.  Skin: Skin is warm and dry. No rash noted.  Psychiatric: She has a normal mood and affect.   Lab Results  Component Value Date   HGBA1C 12.50 07/10/2015    CBG 113  Assessment & Plan:   [place] was seen today for diabetes.  Diagnoses and all orders for this visit:  Type 2 diabetes mellitus with stage 2 chronic  kidney disease, unspecified long term insulin use status (HCC) -     POCT glucose (manual entry) -     Hemoglobin A1c -     insulin lispro protamine-lispro (HUMALOG 75/25 MIX) (75-25) 100 UNIT/ML SUSP injection; Take twice daily based on sliding scale, 30 U for pre meal CBG 100-150, 40 U for 151-170, 50 U for 171-190, 60  U for > 191 -     Discontinue: lisinopril (PRINIVIL,ZESTRIL) 5 MG tablet; Take 1 tablet (5 mg total) by mouth daily. -     lisinopril (PRINIVIL,ZESTRIL) 5 MG tablet; Take 1 tablet (5 mg total) by mouth daily.  Acute on chronic diastolic congestive heart failure (HCC) -     atenolol (TENORMIN) 25 MG tablet;  Take 1 tablet (25 mg total) by mouth 2 (two) times daily. -     potassium chloride (K-DUR) 10 MEQ tablet; Take 1 tablet (10 mEq total) by mouth daily.  Gastroesophageal reflux disease, esophagitis presence not specified -     omeprazole (PRILOSEC) 40 MG capsule; Take 1 capsule (40 mg total) by mouth daily.  Essential hypertension -     atenolol (TENORMIN) 25 MG tablet; Take 1 tablet (25 mg total) by mouth 2 (two) times daily. -     potassium chloride (K-DUR) 10 MEQ tablet; Take 1 tablet (10 mEq total) by mouth daily. -     Discontinue: lisinopril (PRINIVIL,ZESTRIL) 5 MG tablet; Take 1 tablet (5 mg total) by mouth daily. -     lisinopril (PRINIVIL,ZESTRIL) 5 MG tablet; Take 1 tablet (5 mg total) by mouth daily.  Routine screening for STI (sexually transmitted infection) -     RPR -     Hepatitis C antibody, reflex -     HIV antibody (with reflex) -     Urine cytology ancillary only  Post-menopausal atrophic vaginitis -     Estradiol 10 MCG TABS vaginal tablet; Insert 1 tablet (10 mcg) once daily for 2 weeks; Maintenance: Insert 1 tablet twice weekly   No orders of the defined types were placed in this encounter.    Follow-up: No Follow-up on file.   Boykin Nearing MD

## 2015-11-15 ENCOUNTER — Telehealth: Payer: Self-pay | Admitting: Family Medicine

## 2015-11-15 DIAGNOSIS — N952 Postmenopausal atrophic vaginitis: Secondary | ICD-10-CM

## 2015-11-15 LAB — HEPATITIS C ANTIBODY: HCV Ab: NEGATIVE

## 2015-11-15 LAB — URINE CYTOLOGY ANCILLARY ONLY
CHLAMYDIA, DNA PROBE: NEGATIVE
Neisseria Gonorrhea: NEGATIVE
Trichomonas: NEGATIVE

## 2015-11-15 LAB — HIV ANTIBODY (ROUTINE TESTING W REFLEX): HIV 1&2 Ab, 4th Generation: NONREACTIVE

## 2015-11-15 LAB — RPR

## 2015-11-15 MED ORDER — VAGIFEM 10 MCG VA TABS: ORAL_TABLET | VAGINAL | Status: DC

## 2015-11-15 MED ORDER — METFORMIN HCL ER 500 MG PO TB24: 1000.0000 mg | ORAL_TABLET | Freq: Two times a day (BID) | ORAL | Status: DC

## 2015-11-15 NOTE — Telephone Encounter (Signed)
Pt. Called stating that she needs prior authorization on Estradiol 10 MCG TABS vaginal tablet. Pt. Has [employer]. Please f/u with pt.

## 2015-11-15 NOTE — Telephone Encounter (Signed)
The medication did not need prior authorization. The pharmacy was filling the generic of Vagifem which is not covered by Medicaid. Sent in new prescription with DAW so that it will be covered.

## 2015-11-15 NOTE — Addendum Note (Signed)
Addended by: Boykin Nearing on: 11/15/2015 09:08 AM   Modules accepted: Orders

## 2015-11-18 ENCOUNTER — Telehealth: Payer: Self-pay | Admitting: *Deleted

## 2015-11-18 NOTE — Telephone Encounter (Signed)
LVM to return call.

## 2015-11-18 NOTE — Telephone Encounter (Signed)
-----   Message from Boykin Nearing, MD sent at 11/15/2015  9:06 AM EDT ----- A1c improved to 11 Still elevated Continue current care plan With the addition of metformin 500 mg XR to start with increase to 1000 mg XR twice daily as tolerated this will help stabilize blood sugar and help with weight loss success  All STI screening labs are negative

## 2015-11-18 NOTE — Telephone Encounter (Signed)
-----   Message from Boykin Nearing, MD sent at 11/15/2015  9:05 AM EDT ----- A1c improved to 11 Still elevated Continue current care plan With the addition of metformin 500 mg XR to start with increase to 1000 mg XR twice daily as tolerated this will help stabilize blood sugar and help with weight loss success

## 2015-11-26 MED ORDER — ESTROGENS, CONJUGATED 0.625 MG/GM VA CREA: 1.0000 | TOPICAL_CREAM | Freq: Every day | VAGINAL | Status: DC

## 2015-11-26 NOTE — Telephone Encounter (Signed)
Pt. Called stating she called her pharmacy today and she is still not able to her VAGIFEM 10 MCG TABS vaginal tablet.  Rx is not covered by her insurance. Please f/u

## 2015-11-26 NOTE — Telephone Encounter (Signed)
Called insurance and Estring and Premarin Cream are preferred. Sent in prescription for Premarin Cream to replace Vagifem.

## 2015-11-29 DIAGNOSIS — M5030 Other cervical disc degeneration, unspecified cervical region: Secondary | ICD-10-CM | POA: Diagnosis not present

## 2015-11-29 DIAGNOSIS — Z79899 Other long term (current) drug therapy: Secondary | ICD-10-CM | POA: Diagnosis not present

## 2015-11-29 DIAGNOSIS — M47812 Spondylosis without myelopathy or radiculopathy, cervical region: Secondary | ICD-10-CM | POA: Diagnosis not present

## 2015-12-09 ENCOUNTER — Other Ambulatory Visit (HOSPITAL_COMMUNITY): Payer: Self-pay | Admitting: Orthopedic Surgery

## 2015-12-09 DIAGNOSIS — G959 Disease of spinal cord, unspecified: Secondary | ICD-10-CM

## 2015-12-09 DIAGNOSIS — I2510 Atherosclerotic heart disease of native coronary artery without angina pectoris: Secondary | ICD-10-CM

## 2015-12-09 DIAGNOSIS — R0600 Dyspnea, unspecified: Secondary | ICD-10-CM

## 2015-12-12 ENCOUNTER — Other Ambulatory Visit: Payer: Self-pay | Admitting: Family Medicine

## 2015-12-16 ENCOUNTER — Ambulatory Visit (HOSPITAL_COMMUNITY): Payer: Medicare Other | Attending: Orthopedic Surgery

## 2015-12-17 ENCOUNTER — Ambulatory Visit (HOSPITAL_COMMUNITY): Admission: RE | Admit: 2015-12-17 | Payer: Medicare Other | Source: Ambulatory Visit

## 2015-12-20 ENCOUNTER — Encounter (HOSPITAL_COMMUNITY): Payer: Self-pay | Admitting: Orthopedic Surgery

## 2015-12-20 ENCOUNTER — Ambulatory Visit (HOSPITAL_COMMUNITY): Admission: RE | Admit: 2015-12-20 | Payer: Medicare Other | Source: Ambulatory Visit

## 2015-12-24 DIAGNOSIS — G43709 Chronic migraine without aura, not intractable, without status migrainosus: Secondary | ICD-10-CM | POA: Diagnosis not present

## 2015-12-27 ENCOUNTER — Ambulatory Visit (HOSPITAL_COMMUNITY)
Admission: RE | Admit: 2015-12-27 | Discharge: 2015-12-27 | Disposition: A | Discharge status: Home / Self Care | Payer: Medicare Other | Source: Ambulatory Visit | Attending: Orthopedic Surgery | Admitting: Orthopedic Surgery

## 2015-12-27 DIAGNOSIS — G959 Disease of spinal cord, unspecified: Secondary | ICD-10-CM

## 2016-01-02 ENCOUNTER — Other Ambulatory Visit: Payer: Self-pay | Admitting: Internal Medicine

## 2016-01-02 ENCOUNTER — Other Ambulatory Visit (HOSPITAL_COMMUNITY): Payer: Self-pay | Admitting: Family Medicine

## 2016-01-08 ENCOUNTER — Other Ambulatory Visit: Payer: Self-pay | Admitting: Family Medicine

## 2016-01-10 ENCOUNTER — Other Ambulatory Visit: Payer: Self-pay | Admitting: Family Medicine

## 2016-01-10 ENCOUNTER — Ambulatory Visit: Payer: Medicare Other | Attending: Family Medicine

## 2016-01-10 DIAGNOSIS — E1122 Type 2 diabetes mellitus with diabetic chronic kidney disease: Secondary | ICD-10-CM | POA: Diagnosis not present

## 2016-01-10 DIAGNOSIS — N182 Chronic kidney disease, stage 2 (mild): Secondary | ICD-10-CM

## 2016-01-10 LAB — POCT GLYCOSYLATED HEMOGLOBIN (HGB A1C): HEMOGLOBIN A1C: 9.1

## 2016-01-10 NOTE — Progress Notes (Unsigned)
Pt here for lab work only

## 2016-01-13 ENCOUNTER — Other Ambulatory Visit: Payer: Self-pay | Admitting: Family Medicine

## 2016-01-14 ENCOUNTER — Ambulatory Visit (HOSPITAL_COMMUNITY): Payer: Medicare Other | Attending: Orthopedic Surgery

## 2016-01-16 DIAGNOSIS — E113291 Type 2 diabetes mellitus with mild nonproliferative diabetic retinopathy without macular edema, right eye: Secondary | ICD-10-CM | POA: Diagnosis not present

## 2016-01-16 DIAGNOSIS — H25812 Combined forms of age-related cataract, left eye: Secondary | ICD-10-CM | POA: Diagnosis not present

## 2016-01-16 DIAGNOSIS — H25811 Combined forms of age-related cataract, right eye: Secondary | ICD-10-CM | POA: Diagnosis not present

## 2016-01-17 ENCOUNTER — Other Ambulatory Visit: Payer: Self-pay | Admitting: Surgery

## 2016-01-17 DIAGNOSIS — Q289 Congenital malformation of circulatory system, unspecified: Secondary | ICD-10-CM

## 2016-01-20 ENCOUNTER — Ambulatory Visit: Payer: Medicare Other | Attending: Family Medicine | Admitting: Family Medicine

## 2016-01-20 VITALS — BP 102/65 | HR 76 | Temp 98.7°F | Resp 18 | Ht 63.0 in | Wt 214.0 lb

## 2016-01-20 DIAGNOSIS — E1122 Type 2 diabetes mellitus with diabetic chronic kidney disease: Secondary | ICD-10-CM | POA: Diagnosis not present

## 2016-01-20 DIAGNOSIS — E1165 Type 2 diabetes mellitus with hyperglycemia: Secondary | ICD-10-CM | POA: Diagnosis not present

## 2016-01-20 DIAGNOSIS — IMO0002 Reserved for concepts with insufficient information to code with codable children: Secondary | ICD-10-CM

## 2016-01-20 DIAGNOSIS — N182 Chronic kidney disease, stage 2 (mild): Secondary | ICD-10-CM

## 2016-01-20 DIAGNOSIS — Z794 Long term (current) use of insulin: Secondary | ICD-10-CM

## 2016-01-20 DIAGNOSIS — I10 Essential (primary) hypertension: Secondary | ICD-10-CM | POA: Diagnosis not present

## 2016-01-20 DIAGNOSIS — I2510 Atherosclerotic heart disease of native coronary artery without angina pectoris: Secondary | ICD-10-CM

## 2016-01-20 LAB — GLUCOSE, POCT (MANUAL RESULT ENTRY): POC GLUCOSE: 143 mg/dL — AB (ref 70–99)

## 2016-01-20 MED ORDER — INSULIN LISPRO PROT & LISPRO (75-25 MIX) 100 UNIT/ML ~~LOC~~ SUSP: SUBCUTANEOUS | Status: DC

## 2016-01-20 MED ORDER — BUMETANIDE 0.5 MG PO TABS: 0.5000 mg | ORAL_TABLET | Freq: Every day | ORAL | Status: DC

## 2016-01-20 MED ORDER — ESTROGENS, CONJUGATED 0.625 MG/GM VA CREA: TOPICAL_CREAM | VAGINAL | Status: DC

## 2016-01-20 NOTE — Patient Instructions (Addendum)
Heidi [name] was seen today for diabetes.  Diagnoses and all orders for this visit:  Type 2 diabetes mellitus with stage 2 chronic kidney disease, with long-term current use of insulin (HCC) -     Glucose (CBG) -     insulin lispro protamine-lispro (HUMALOG 75/25 MIX) (75-25) 100 UNIT/ML SUSP injection; Take twice daily based on sliding scale, 15 U for pre meal CBG 90-110,  20 U for 11-150, 30 U for 151-200, 40 U for > 200 -     BASIC METABOLIC PANEL WITH GFR  Essential hypertension -     bumetanide (BUMEX) 0.5 MG tablet; Take 1 tablet (0.5 mg total) by mouth daily. -     BASIC METABOLIC PANEL WITH GFR  Other orders -     conjugated estrogens (PREMARIN) vaginal cream; 0.5 g intravaginally twice weekly, Monday and Thursday -     Discontinue: insulin lispro protamine-lispro (HUMALOG 75/25 MIX) (75-25) 100 UNIT/ML SUSP injection; Take twice daily based on sliding scale, 10 U for pre meal CBG 90-110,  20 U for 11-150, 30 U for 151-200, 40 U for > 200  STOP HCTZ, reordered bumex  Great job with exercise and weight loss. Keep up the good work.   Scale down on insulin per orders to avoid hypoglycemia  F/u in 4 weeks for pharmacy BP check and CBG review  F/u with provider in 3 months for diabetes   Dr. Adrian Blackwater

## 2016-01-20 NOTE — Progress Notes (Signed)
Subjective:  Patient ID: Heidi [name], female    DOB: 11/09/1950  Age: 65 y.o. MRN: [id number]  CC: Diabetes   HPI [name] has CHF, DM2, CKD, OSA, asthma, HTN presents for   1. Diabetes:  Fasting 70-115 After meals 44-80 before lunch 109-157 before dinner   She is exercising after breakfast at the gym.   2. HTN: she is still taking demadex 2.5 mg daily despite being advised not to. She prefers this over HCTZ as she feels it is better for her edema. She is compliant with atenolol 25 mg BID and lisinopril 5 mg daily. No CP or SOB. No dizziness or lightheadedness.   Social History  Substance Use Topics  . Smoking status: Never Smoker   . Smokeless tobacco: Not on file  . Alcohol Use: No    Outpatient Prescriptions Prior to Visit  Medication Sig Dispense Refill  . albuterol (PROVENTIL HFA;VENTOLIN HFA) 108 (90 BASE) MCG/ACT inhaler Inhale 2 puffs into the lungs every 6 (six) hours as needed for wheezing or shortness of breath. 1 Inhaler 3  . albuterol (PROVENTIL) (2.5 MG/3ML) 0.083% nebulizer solution INHALE CONTENTS OF 1 VIAL VIA NEBULIZER EVERY 6 HOURS AS NEEDED FOR WHEEZING OR SHORTNESS OF BREATH 450 mL 1  . Ascorbic Acid (VITAMIN C PO) Take 1 tablet by mouth daily.    Marland Kitchen aspirin 81 MG chewable tablet Chew 81 mg by mouth daily.     Marland Kitchen atenolol (TENORMIN) 25 MG tablet Take 1 tablet (25 mg total) by mouth 2 (two) times daily. 60 tablet 3  . atorvastatin (LIPITOR) 40 MG tablet TAKE 1 TABLET BY MOUTH DAILY AT 6 PM 90 tablet 0  . calcium-vitamin D (OSCAL WITH D) 500-200 MG-UNIT tablet Take 1 tablet by mouth daily. 30 tablet 11  . Fluticasone-Salmeterol (ADVAIR DISKUS) 500-50 MCG/DOSE AEPB Inhale 1 puff into the lungs 2 (two) times daily. 60 each 5  . FREESTYLE TEST STRIPS test strip USE AS DIRECTED 100 each 3  . hydrochlorothiazide (HYDRODIURIL) 25 MG tablet Take 1 tablet (25 mg total) by mouth daily. 90 tablet 3  . HYDROcodone-acetaminophen (NORCO) 7.5-325 MG per tablet Take 1  tablet by mouth every 6 (six) hours as needed for moderate pain or severe pain.   0  . insulin lispro protamine-lispro (HUMALOG 75/25 MIX) (75-25) 100 UNIT/ML SUSP injection Take twice daily based on sliding scale, 30 U for pre meal CBG 100-150, 40 U for 151-170, 50 U for 171-190, 60  U for > 191 50 mL 5  . Insulin Syringe-Needle U-100 (INSULIN SYRINGE 1CC/28G) 28G X 1/2" 1 ML MISC 1 each by Does not apply route 3 (three) times daily. 90 each 11  . Lancets (FREESTYLE) lancets 1 each by Other route 3 (three) times daily. Use as instructed 100 each 12  . lisinopril (PRINIVIL,ZESTRIL) 5 MG tablet Take 1 tablet (5 mg total) by mouth daily. 90 tablet 3  . montelukast (SINGULAIR) 10 MG tablet Take 1 tablet (10 mg total) by mouth daily. 90 tablet 3  . Multiple Vitamin (MULTIVITAMIN WITH MINERALS) TABS tablet Take 1 tablet by mouth daily.    . nitroGLYCERIN (NITROSTAT) 0.3 MG SL tablet Place 0.3 mg under the tongue every 5 (five) minutes as needed for chest pain.     Marland Kitchen omeprazole (PRILOSEC) 40 MG capsule Take 1 capsule (40 mg total) by mouth daily. 30 capsule 11  . potassium chloride (K-DUR) 10 MEQ tablet Take 1 tablet (10 mEq total) by mouth daily. 90 tablet 3  .  pregabalin (LYRICA) 50 MG capsule Take 50 mg by mouth 2 (two) times daily. Reported on 11/14/2015    . PREMARIN vaginal cream INSERT 1 APPLICATORFUL VAGINALLY DAILY 30 g 0  . topiramate (TOPAMAX) 50 MG tablet Take 50 mg by mouth 2 (two) times daily.  2  . metFORMIN (GLUCOPHAGE XR) 500 MG 24 hr tablet Take 2 tablets (1,000 mg total) by mouth 2 (two) times daily. (Patient not taking: Reported on 01/20/2016) 120 tablet 5   No facility-administered medications prior to visit.    ROS Review of Systems  Constitutional: Positive for fatigue. Negative for fever and chills.  Eyes: Negative for visual disturbance.  Respiratory: Negative for shortness of breath.   Cardiovascular: Negative for chest pain.  Gastrointestinal: Negative for abdominal pain  and blood in stool.  Musculoskeletal: Negative for back pain and arthralgias.  Skin: Negative for rash.  Allergic/Immunologic: Negative for immunocompromised state.  Neurological: Negative for dizziness.  Hematological: Negative for adenopathy. Does not bruise/bleed easily.  Psychiatric/Behavioral: Negative for suicidal ideas and dysphoric mood.    Objective:  BP 102/65 mmHg  Pulse 76  Temp(Src) 98.7 F (37.1 C) (Oral)  Resp 18  Ht 5\' 3"  (1.6 m)  Wt 214 lb (97.07 kg)  BMI 37.92 kg/m2  SpO2 100%  BP/Weight 01/20/2016 11/14/2015 123XX123  Systolic BP A999333 [id number]  Diastolic BP 65 67 75  Wt. (Lbs) 214 227 240  BMI 37.92 40.22 42.52   Physical Exam  Constitutional: She is oriented to person, place, and time. She appears well-developed and well-nourished. No distress.  Obese   HENT:  Head: Normocephalic and atraumatic.  Cardiovascular: Normal rate, regular rhythm, normal heart sounds and intact distal pulses.   Pulmonary/Chest: Effort normal and breath sounds normal.  Musculoskeletal: She exhibits no edema.  Neurological: She is alert and oriented to person, place, and time.  Skin: Skin is warm and dry. No rash noted.  Psychiatric: She has a normal mood and affect.   Lab Results  Component Value Date   HGBA1C 9.1 01/10/2016   CBG 143    Assessment & Plan:   [name] was seen today for diabetes.  Diagnoses and all orders for this visit:  Type 2 diabetes mellitus with stage 2 chronic kidney disease, with long-term current use of insulin (HCC) -     Glucose (CBG) -     insulin lispro protamine-lispro (HUMALOG 75/25 MIX) (75-25) 100 UNIT/ML SUSP injection; Take twice daily based on sliding scale, 15 U for pre meal CBG 90-110,  20 U for 11-150, 30 U for 151-200, 40 U for > 200 -     BASIC METABOLIC PANEL WITH GFR  Essential hypertension -     bumetanide (BUMEX) 0.5 MG tablet; Take 1 tablet (0.5 mg total) by mouth daily. -     BASIC METABOLIC PANEL WITH GFR  Other  orders -     conjugated estrogens (PREMARIN) vaginal cream; 0.5 g intravaginally twice weekly, Monday and Thursday -     Discontinue: insulin lispro protamine-lispro (HUMALOG 75/25 MIX) (75-25) 100 UNIT/ML SUSP injection; Take twice daily based on sliding scale, 10 U for pre meal CBG 90-110,  20 U for 11-150, 30 U for 151-200, 40 U for > 200    Meds ordered this encounter  Medications  . conjugated estrogens (PREMARIN) vaginal cream    Sig: 0.5 g intravaginally twice weekly, Monday and Thursday    Dispense:  30 g    Refill:  0  . bumetanide (BUMEX) 0.5  MG tablet    Sig: Take 1 tablet (0.5 mg total) by mouth daily.    Dispense:  30 tablet    Refill:  2    D/c HCTZ  . DISCONTD: insulin lispro protamine-lispro (HUMALOG 75/25 MIX) (75-25) 100 UNIT/ML SUSP injection    Sig: Take twice daily based on sliding scale, 10 U for pre meal CBG 90-110,  20 U for 11-150, 30 U for 151-200, 40 U for > 200    Dispense:  50 mL    Refill:  5  . insulin lispro protamine-lispro (HUMALOG 75/25 MIX) (75-25) 100 UNIT/ML SUSP injection    Sig: Take twice daily based on sliding scale, 15 U for pre meal CBG 90-110,  20 U for 11-150, 30 U for 151-200, 40 U for > 200    Dispense:  50 mL    Refill:  5    Follow-up: Return in about 3 months (around 04/21/2016) for diabetes .   Boykin Nearing MD

## 2016-01-21 LAB — BASIC METABOLIC PANEL WITH GFR
BUN: 25 mg/dL (ref 7–25)
CALCIUM: 9.7 mg/dL (ref 8.6–10.4)
CHLORIDE: 103 mmol/L (ref 98–110)
CO2: 25 mmol/L (ref 20–31)
CREATININE: 1.38 mg/dL — AB (ref 0.50–0.99)
GFR, EST AFRICAN AMERICAN: 47 mL/min — AB (ref >=60)
GFR, Est Non African American: 41 mL/min — ABNORMAL LOW (ref >=60)
Glucose, Bld: 141 mg/dL — ABNORMAL HIGH (ref 65–99)
Potassium: 3.6 mmol/L (ref 3.5–5.3)
SODIUM: 142 mmol/L (ref 135–146)

## 2016-01-22 ENCOUNTER — Encounter: Payer: Self-pay | Admitting: Family Medicine

## 2016-01-22 NOTE — Assessment & Plan Note (Signed)
A: well controlled Med: non compliant with regimen. She has a hx of non compliance with diabetes regimen as well   P: D/c HCTZ as this is not needed if patient takes Demadex Demadex 2.5 mg daily Atenolol 25 mg BID lisinopril 5 mg daily

## 2016-01-22 NOTE — Assessment & Plan Note (Signed)
A: improving with hypoglycemia P: Scale down on humalog 75/25 to prevent post prandial hypoglycemia Close f/u for CBG review

## 2016-01-23 ENCOUNTER — Encounter: Payer: Self-pay | Admitting: Surgery

## 2016-01-29 ENCOUNTER — Ambulatory Visit (HOSPITAL_COMMUNITY): Admission: RE | Admit: 2016-01-29 | Payer: Medicare Other | Source: Ambulatory Visit

## 2016-01-29 ENCOUNTER — Encounter: Payer: Medicare Other | Admitting: Surgery

## 2016-01-29 ENCOUNTER — Telehealth: Payer: Self-pay

## 2016-01-29 NOTE — Telephone Encounter (Signed)
Patient was called about her results. Patient states that she stopped taking the hydrochlorothiazide and her feet began to swell. Patient started back taking hydrochlorothiazide and her swelling went down.

## 2016-01-29 NOTE — Telephone Encounter (Addendum)
Patient was called about her results. Patient states that she stopped taking the hydrochlorothiazide and her feet began to swell. Patient started back taking hydrochlorothiazide and her swelling went down.

## 2016-01-30 NOTE — Telephone Encounter (Signed)
Please call back to patient  Patient advised to take HCTZ or bumex She is not to take both as her BPs have been on the low end  She should let me know which she prefers to take. For now bumex remains on med list.    BP Readings from Last 3 Encounters:  01/20/16 102/65  11/14/15 108/67  09/20/15 119/75

## 2016-02-10 DIAGNOSIS — Z79899 Other long term (current) drug therapy: Secondary | ICD-10-CM | POA: Diagnosis not present

## 2016-02-10 DIAGNOSIS — M5137 Other intervertebral disc degeneration, lumbosacral region: Secondary | ICD-10-CM | POA: Diagnosis not present

## 2016-02-10 DIAGNOSIS — M1288 Other specific arthropathies, not elsewhere classified, other specified site: Secondary | ICD-10-CM | POA: Diagnosis not present

## 2016-02-10 DIAGNOSIS — M47812 Spondylosis without myelopathy or radiculopathy, cervical region: Secondary | ICD-10-CM | POA: Diagnosis not present

## 2016-02-10 DIAGNOSIS — M5030 Other cervical disc degeneration, unspecified cervical region: Secondary | ICD-10-CM | POA: Diagnosis not present

## 2016-02-15 ENCOUNTER — Other Ambulatory Visit: Payer: Self-pay | Admitting: Family Medicine

## 2016-02-15 DIAGNOSIS — IMO0002 Reserved for concepts with insufficient information to code with codable children: Secondary | ICD-10-CM

## 2016-02-15 DIAGNOSIS — E1165 Type 2 diabetes mellitus with hyperglycemia: Secondary | ICD-10-CM

## 2016-03-03 ENCOUNTER — Encounter: Payer: Self-pay | Admitting: Vascular Surgery

## 2016-03-13 ENCOUNTER — Encounter: Payer: Medicare Other | Admitting: Vascular Surgery

## 2016-03-13 ENCOUNTER — Inpatient Hospital Stay (HOSPITAL_COMMUNITY): Admission: RE | Admit: 2016-03-13 | Payer: Medicare Other | Source: Ambulatory Visit

## 2016-03-16 ENCOUNTER — Other Ambulatory Visit: Payer: Self-pay | Admitting: Family Medicine

## 2016-03-25 DIAGNOSIS — G43709 Chronic migraine without aura, not intractable, without status migrainosus: Secondary | ICD-10-CM | POA: Diagnosis not present

## 2016-03-26 ENCOUNTER — Ambulatory Visit: Payer: Medicare Other | Attending: Family Medicine | Admitting: Family Medicine

## 2016-03-26 ENCOUNTER — Encounter: Payer: Self-pay | Admitting: Family Medicine

## 2016-03-26 ENCOUNTER — Telehealth: Payer: Self-pay | Admitting: Family Medicine

## 2016-03-26 VITALS — BP 101/63 | HR 74 | Temp 97.9°F | Ht 63.0 in | Wt 213.0 lb

## 2016-03-26 DIAGNOSIS — J069 Acute upper respiratory infection, unspecified: Secondary | ICD-10-CM

## 2016-03-26 DIAGNOSIS — Z794 Long term (current) use of insulin: Secondary | ICD-10-CM | POA: Insufficient documentation

## 2016-03-26 DIAGNOSIS — E1122 Type 2 diabetes mellitus with diabetic chronic kidney disease: Secondary | ICD-10-CM | POA: Diagnosis not present

## 2016-03-26 DIAGNOSIS — I2510 Atherosclerotic heart disease of native coronary artery without angina pectoris: Secondary | ICD-10-CM

## 2016-03-26 DIAGNOSIS — E1165 Type 2 diabetes mellitus with hyperglycemia: Secondary | ICD-10-CM | POA: Diagnosis not present

## 2016-03-26 DIAGNOSIS — E162 Hypoglycemia, unspecified: Secondary | ICD-10-CM | POA: Diagnosis not present

## 2016-03-26 DIAGNOSIS — IMO0002 Reserved for concepts with insufficient information to code with codable children: Secondary | ICD-10-CM

## 2016-03-26 DIAGNOSIS — R05 Cough: Secondary | ICD-10-CM | POA: Diagnosis not present

## 2016-03-26 DIAGNOSIS — R51 Headache: Secondary | ICD-10-CM | POA: Insufficient documentation

## 2016-03-26 DIAGNOSIS — N182 Chronic kidney disease, stage 2 (mild): Secondary | ICD-10-CM | POA: Diagnosis not present

## 2016-03-26 DIAGNOSIS — J029 Acute pharyngitis, unspecified: Secondary | ICD-10-CM | POA: Diagnosis not present

## 2016-03-26 LAB — GLUCOSE, POCT (MANUAL RESULT ENTRY): POC Glucose: 67 mg/dL — AB (ref 70–99)

## 2016-03-26 MED ORDER — AMOXICILLIN 500 MG PO CAPS: 500.0000 mg | ORAL_CAPSULE | Freq: Three times a day (TID) | ORAL | 0 refills | Status: DC

## 2016-03-26 NOTE — Progress Notes (Signed)
Subjective:  Patient ID: Heidi [name], female    DOB: August 16, 1950  Age: 65 y.o. MRN: [id number]  CC: Cough   HPI Heidi [name] presents for   1. Cough: with sore throat for the past for days. Also with headache but has hx of chronic headache. No fever but having chills. Daughter and grandsons sick.   2. Hypoglycemia: she took 60 U of novolog 75/25 this AM. She gets low sugars at home at times. She is not following the sliding scale. She has a history of inappropriately self dosing insulin.   Social History  Substance Use Topics  . Smoking status: Never Smoker  . Smokeless tobacco: Not on file  . Alcohol use No    Outpatient Medications Prior to Visit  Medication Sig Dispense Refill  . albuterol (PROVENTIL HFA;VENTOLIN HFA) 108 (90 BASE) MCG/ACT inhaler Inhale 2 puffs into the lungs every 6 (six) hours as needed for wheezing or shortness of breath. 1 Inhaler 3  . albuterol (PROVENTIL) (2.5 MG/3ML) 0.083% nebulizer solution INHALE CONTENTS OF 1 VIAL VIA NEBULIZER EVERY 6 HOURS AS NEEDED FOR WHEEZING OR SHORTNESS OF BREATH 450 mL 1  . Ascorbic Acid (VITAMIN C PO) Take 1 tablet by mouth daily.    Marland Kitchen aspirin 81 MG chewable tablet Chew 81 mg by mouth daily.     Marland Kitchen atenolol (TENORMIN) 25 MG tablet Take 1 tablet (25 mg total) by mouth 2 (two) times daily. 60 tablet 3  . atorvastatin (LIPITOR) 40 MG tablet TAKE 1 TABLET BY MOUTH DAILY AT 6 PM 90 tablet 1  . bumetanide (BUMEX) 0.5 MG tablet Take 1 tablet (0.5 mg total) by mouth daily. 30 tablet 2  . calcium-vitamin D (OSCAL WITH D) 500-200 MG-UNIT tablet Take 1 tablet by mouth daily. 30 tablet 11  . conjugated estrogens (PREMARIN) vaginal cream 0.5 g intravaginally twice weekly, Monday and Thursday 30 g 0  . Fluticasone-Salmeterol (ADVAIR DISKUS) 500-50 MCG/DOSE AEPB Inhale 1 puff into the lungs 2 (two) times daily. 60 each 5  . FREESTYLE TEST STRIPS test strip USE AS DIRECTED 100 each 3  . insulin lispro protamine-lispro (HUMALOG 75/25 MIX)  (75-25) 100 UNIT/ML SUSP injection Take twice daily based on sliding scale, 15 U for pre meal CBG 90-110,  20 U for 11-150, 30 U for 151-200, 40 U for > 200 50 mL 5  . INSULIN SYRINGE 1CC/29G 29G X 1/2" 1 ML MISC USE THREE TIMES DAILY AS DIRECTED 100 each 5  . Lancets (FREESTYLE) lancets 1 each by Other route 3 (three) times daily. Use as instructed 100 each 12  . lisinopril (PRINIVIL,ZESTRIL) 5 MG tablet Take 1 tablet (5 mg total) by mouth daily. 90 tablet 3  . montelukast (SINGULAIR) 10 MG tablet Take 1 tablet (10 mg total) by mouth daily. 90 tablet 3  . Multiple Vitamin (MULTIVITAMIN WITH MINERALS) TABS tablet Take 1 tablet by mouth daily.    . nitroGLYCERIN (NITROSTAT) 0.3 MG SL tablet Place 0.3 mg under the tongue every 5 (five) minutes as needed for chest pain.     Marland Kitchen omeprazole (PRILOSEC) 40 MG capsule Take 1 capsule (40 mg total) by mouth daily. 30 capsule 11  . potassium chloride (K-DUR) 10 MEQ tablet Take 1 tablet (10 mEq total) by mouth daily. 90 tablet 3  . pregabalin (LYRICA) 50 MG capsule Take 50 mg by mouth 2 (two) times daily. Reported on 11/14/2015    . topiramate (TOPAMAX) 50 MG tablet Take 50 mg by mouth 2 (two) times  daily.  2  . HYDROcodone-acetaminophen (NORCO) 7.5-325 MG per tablet Take 1 tablet by mouth every 6 (six) hours as needed for moderate pain or severe pain.   0  . metFORMIN (GLUCOPHAGE XR) 500 MG 24 hr tablet Take 2 tablets (1,000 mg total) by mouth 2 (two) times daily. (Patient not taking: Reported on 03/26/2016) 120 tablet 5   No facility-administered medications prior to visit.     ROS Review of Systems  Constitutional: Positive for chills and fatigue. Negative for fever.  HENT: Positive for sore throat.   Eyes: Negative for visual disturbance.  Respiratory: Positive for cough. Negative for shortness of breath.   Cardiovascular: Negative for chest pain.  Gastrointestinal: Negative for abdominal pain and blood in stool.  Musculoskeletal: Negative for  arthralgias and back pain.  Skin: Negative for rash.  Allergic/Immunologic: Negative for immunocompromised state.  Neurological: Negative for dizziness.  Hematological: Negative for adenopathy. Does not bruise/bleed easily.  Psychiatric/Behavioral: Negative for dysphoric mood and suicidal ideas.    Objective:  BP 101/63 (BP Location: Right Arm, Patient Position: Sitting, Cuff Size: Small)   Pulse 74   Temp 97.9 F (36.6 C) (Oral)   Ht 5\' 3"  (1.6 m)   Wt 213 lb (96.6 kg)   SpO2 94%   BMI 37.73 kg/m   BP/Weight 03/26/2016 01/20/2016 [id number]  Systolic BP 99991111 A999333 [id number]  Diastolic BP 63 65 67  Wt. (Lbs) 213 214 227  BMI 37.73 37.92 40.22   Physical Exam  Constitutional: She is oriented to person, place, and time. She appears well-developed and well-nourished. No distress.  HENT:  Head: Normocephalic and atraumatic.  Cardiovascular: Normal rate, regular rhythm, normal heart sounds and intact distal pulses.   Pulmonary/Chest: Effort normal and breath sounds normal.  Musculoskeletal: She exhibits no edema.  Neurological: She is alert and oriented to person, place, and time.  Skin: Skin is warm and dry. No rash noted.  Psychiatric: She has a normal mood and affect.   Lab Results  Component Value Date   HGBA1C 9.1 01/10/2016   CBG 67   Rapid strep negative  Treated low sugar with soda and crackers  Assessment & Plan:   Heidi [name] was seen today for cough.  Diagnoses and all orders for this visit:  Uncontrolled type 2 diabetes mellitus with stage 2 chronic kidney disease, with long-term current use of insulin (HCC) -     POCT glucose (manual entry)  Acute upper respiratory infection -     amoxicillin (AMOXIL) 500 MG capsule; Take 1 capsule (500 mg total) by mouth 3 (three) times daily.   No orders of the defined types were placed in this encounter.   Follow-up: Return in about 6 weeks (around 05/07/2016) for diabetes .   Boykin Nearing MD

## 2016-03-26 NOTE — Progress Notes (Signed)
Pt has had cold symptoms for 4 days.

## 2016-03-26 NOTE — Assessment & Plan Note (Signed)
Suspect viral Supportive care Patient requesting antibiotic, I advise to wait until day 10 of illness if she is not improving Amoxicillin prescribed

## 2016-03-26 NOTE — Assessment & Plan Note (Addendum)
A: diabetes type 2 with hypoglycemia due to non compliance with sliding scale P: Reviewed sliding scale  F/u in 6 weeks for repeat A1c

## 2016-03-26 NOTE — Telephone Encounter (Signed)
Patient called to speak with nurse regarding the over the counter medication that she took. Pt has a bad cold and sore throat and took an over the counter medication that made her pass out. Pt wants an anti biotic called in since there are no appts available for today or tomorrow. I advice her to go to Urgent Care, pt said no because they don't know her medical history like PCP does. Pt is no longer taking over the counter med.   Thank you

## 2016-03-26 NOTE — Patient Instructions (Addendum)
Cereniti was seen today for cough.  Diagnoses and all orders for this visit:  Uncontrolled type 2 diabetes mellitus with stage 2 chronic kidney disease, with long-term current use of insulin (HCC) -     POCT glucose (manual entry)  Acute upper respiratory infection -     amoxicillin (AMOXIL) 500 MG capsule; Take 1 capsule (500 mg total) by mouth 3 (three) times daily.  You most likely have a viral URI with cough. For this please do the following:  1. Drink plenty of fluids. Hot tea, soup etc will help open your nasal passages. 2. Dextromethorphan 30 mg every 6-8 hrs (plain Robitussin) for cough suppression or cough drops with honey  3. Tylenol for pain up to 1000 mg three times daily.  4. Nasal saline-OTC nose spray for congestion.   F/u for chest pain, shortness of breath, persistent high fever.  Be sure to reference your sliding scale when dosing Humalog 75/25  F/u for flu shot with flu clinic   F/u with me in 6 weeks for diabetes and f/u upper respiratory infection   Dr. Adrian Blackwater

## 2016-04-01 ENCOUNTER — Other Ambulatory Visit: Payer: Self-pay | Admitting: Family Medicine

## 2016-04-06 NOTE — Telephone Encounter (Signed)
Pt was called on 10/02, a VM was left for her to return the phone call.

## 2016-04-07 ENCOUNTER — Encounter: Payer: Self-pay | Admitting: Vascular Surgery

## 2016-04-08 ENCOUNTER — Other Ambulatory Visit (HOSPITAL_COMMUNITY): Payer: Self-pay | Admitting: Orthopedic Surgery

## 2016-04-08 DIAGNOSIS — G959 Disease of spinal cord, unspecified: Secondary | ICD-10-CM

## 2016-04-10 ENCOUNTER — Ambulatory Visit (HOSPITAL_COMMUNITY): Payer: Medicare Other

## 2016-04-10 ENCOUNTER — Encounter: Payer: Medicare Other | Admitting: Vascular Surgery

## 2016-04-10 ENCOUNTER — Ambulatory Visit (HOSPITAL_COMMUNITY)
Admission: RE | Admit: 2016-04-10 | Discharge: 2016-04-10 | Disposition: A | Discharge status: Home / Self Care | Payer: Medicare Other | Source: Ambulatory Visit | Attending: Orthopedic Surgery | Admitting: Orthopedic Surgery

## 2016-04-10 DIAGNOSIS — M4802 Spinal stenosis, cervical region: Secondary | ICD-10-CM | POA: Diagnosis not present

## 2016-04-10 DIAGNOSIS — M4306 Spondylolysis, lumbar region: Secondary | ICD-10-CM | POA: Insufficient documentation

## 2016-04-10 DIAGNOSIS — G959 Disease of spinal cord, unspecified: Secondary | ICD-10-CM | POA: Diagnosis not present

## 2016-04-10 DIAGNOSIS — M50323 Other cervical disc degeneration at C6-C7 level: Secondary | ICD-10-CM | POA: Insufficient documentation

## 2016-04-10 DIAGNOSIS — M50322 Other cervical disc degeneration at C5-C6 level: Secondary | ICD-10-CM | POA: Diagnosis not present

## 2016-04-27 ENCOUNTER — Encounter: Payer: Self-pay | Admitting: Vascular Surgery

## 2016-04-29 ENCOUNTER — Encounter (HOSPITAL_COMMUNITY): Payer: Medicare Other

## 2016-04-29 ENCOUNTER — Encounter: Payer: Medicare Other | Admitting: Vascular Surgery

## 2016-04-29 ENCOUNTER — Telehealth: Payer: Self-pay | Admitting: Vascular Surgery

## 2016-04-29 NOTE — Telephone Encounter (Signed)
The patient called at 2pm stating that she was running late for her appointment. I told her that we only allow a 15 minute grace period and if she feels she can make it, she is welcome to come in. At 2:20pm, the patient calls back and states she would not make it. The patient has no showed for this appointment at least 2 prior times. I was able to reschedule her Carotid US on 10/26 and Office visit on 11/2 since she didn't want to wait til the end of November to be seen.

## 2016-04-30 ENCOUNTER — Ambulatory Visit (HOSPITAL_COMMUNITY)
Admission: RE | Admit: 2016-04-30 | Discharge: 2016-04-30 | Disposition: A | Discharge status: Home / Self Care | Payer: Medicare Other | Source: Ambulatory Visit | Attending: Surgery | Admitting: Surgery

## 2016-04-30 DIAGNOSIS — I2510 Atherosclerotic heart disease of native coronary artery without angina pectoris: Secondary | ICD-10-CM

## 2016-04-30 LAB — VAS US CAROTID
LCCAPSYS: 87 cm/s
LEFT ECA DIAS: -8 cm/s
LEFT VERTEBRAL DIAS: 7 cm/s
LICADDIAS: -19 cm/s
Left CCA dist dias: 13 cm/s
Left CCA dist sys: 53 cm/s
Left CCA prox dias: 12 cm/s
Left ICA dist sys: -74 cm/s
Left ICA prox dias: 11 cm/s
Left ICA prox sys: 46 cm/s
RCCADSYS: -62 cm/s
RIGHT CCA MID DIAS: 12 cm/s
RIGHT ECA DIAS: -12 cm/s
RIGHT VERTEBRAL DIAS: 10 cm/s
Right CCA prox dias: 15 cm/s
Right CCA prox sys: 76 cm/s

## 2016-05-06 ENCOUNTER — Encounter: Payer: Self-pay | Admitting: Vascular Surgery

## 2016-05-07 ENCOUNTER — Ambulatory Visit (INDEPENDENT_AMBULATORY_CARE_PROVIDER_SITE_OTHER): Payer: Self-pay | Admitting: Vascular Surgery

## 2016-05-07 ENCOUNTER — Encounter: Payer: Self-pay | Admitting: Vascular Surgery

## 2016-05-07 VITALS — BP 120/70 | HR 74 | Temp 97.0°F | Resp 18 | Ht 63.0 in | Wt 213.9 lb

## 2016-05-07 DIAGNOSIS — M542 Cervicalgia: Secondary | ICD-10-CM

## 2016-05-07 NOTE — Progress Notes (Signed)
This patient was admitted to my schedule is a new patient for "cervical myelopathy, coronary artery disease, and circulatory anomaly. I have reviewed her records and x-ray studies that are available and I do not see any reason for a vascular consultation. She did have a carotid duplex scan here on 04/30/2016 which was normal. I have reviewed these results with her. We have given her a copy after she signed a medical release. I'll certainly be happy to see her at any time if there are any specific vascular issues to address.  Carotid duplex: I have reviewed the carotid duplex scan that was done on 04/30/2016. This showed no evidence of significant carotid disease bilaterally.  Labs on 01/20/2016 show a creatinine of 1.38 with a GFR of 47.   MRI CERVICAL SPINE: The patient had an MR of the cervical spine on 04/10/2016. This showed multilevel degenerative disc disease extending from C4-C5 through C6-C7. There was moderate bilateral foraminal stenosis.   Deitra Mayo, MD, [place] [phone] Office: [phone]

## 2016-05-12 ENCOUNTER — Encounter: Payer: Self-pay | Admitting: Family Medicine

## 2016-05-12 ENCOUNTER — Ambulatory Visit: Payer: Medicare Other | Attending: Family Medicine | Admitting: Family Medicine

## 2016-05-12 VITALS — BP 123/78 | HR 77 | Temp 97.6°F | Ht 63.0 in | Wt 214.8 lb

## 2016-05-12 DIAGNOSIS — N182 Chronic kidney disease, stage 2 (mild): Secondary | ICD-10-CM | POA: Diagnosis not present

## 2016-05-12 DIAGNOSIS — G44229 Chronic tension-type headache, not intractable: Secondary | ICD-10-CM | POA: Diagnosis not present

## 2016-05-12 DIAGNOSIS — E1122 Type 2 diabetes mellitus with diabetic chronic kidney disease: Secondary | ICD-10-CM | POA: Diagnosis not present

## 2016-05-12 DIAGNOSIS — I10 Essential (primary) hypertension: Secondary | ICD-10-CM

## 2016-05-12 DIAGNOSIS — M542 Cervicalgia: Secondary | ICD-10-CM

## 2016-05-12 DIAGNOSIS — N281 Cyst of kidney, acquired: Secondary | ICD-10-CM | POA: Diagnosis not present

## 2016-05-12 DIAGNOSIS — G8929 Other chronic pain: Secondary | ICD-10-CM | POA: Diagnosis not present

## 2016-05-12 DIAGNOSIS — Z794 Long term (current) use of insulin: Secondary | ICD-10-CM | POA: Diagnosis not present

## 2016-05-12 DIAGNOSIS — I2510 Atherosclerotic heart disease of native coronary artery without angina pectoris: Secondary | ICD-10-CM

## 2016-05-12 DIAGNOSIS — M170 Bilateral primary osteoarthritis of knee: Secondary | ICD-10-CM

## 2016-05-12 DIAGNOSIS — N959 Unspecified menopausal and perimenopausal disorder: Secondary | ICD-10-CM | POA: Insufficient documentation

## 2016-05-12 DIAGNOSIS — E1165 Type 2 diabetes mellitus with hyperglycemia: Secondary | ICD-10-CM

## 2016-05-12 DIAGNOSIS — Z7982 Long term (current) use of aspirin: Secondary | ICD-10-CM | POA: Insufficient documentation

## 2016-05-12 DIAGNOSIS — Z780 Asymptomatic menopausal state: Secondary | ICD-10-CM

## 2016-05-12 DIAGNOSIS — I130 Hypertensive heart and chronic kidney disease with heart failure and stage 1 through stage 4 chronic kidney disease, or unspecified chronic kidney disease: Secondary | ICD-10-CM | POA: Insufficient documentation

## 2016-05-12 DIAGNOSIS — Z79899 Other long term (current) drug therapy: Secondary | ICD-10-CM | POA: Diagnosis not present

## 2016-05-12 DIAGNOSIS — IMO0002 Reserved for concepts with insufficient information to code with codable children: Secondary | ICD-10-CM

## 2016-05-12 DIAGNOSIS — Z23 Encounter for immunization: Secondary | ICD-10-CM | POA: Diagnosis not present

## 2016-05-12 LAB — POCT URINALYSIS DIPSTICK
Bilirubin, UA: NEGATIVE
Blood, UA: NEGATIVE
Glucose, UA: 100
Ketones, UA: NEGATIVE
LEUKOCYTES UA: NEGATIVE
NITRITE UA: NEGATIVE
Spec Grav, UA: 1.015
UROBILINOGEN UA: 1
pH, UA: 7

## 2016-05-12 LAB — GLUCOSE, POCT (MANUAL RESULT ENTRY): POC Glucose: 209 mg/dl — AB (ref 70–99)

## 2016-05-12 LAB — POCT GLYCOSYLATED HEMOGLOBIN (HGB A1C): HEMOGLOBIN A1C: 9.5

## 2016-05-12 MED ORDER — INSULIN LISPRO PROT & LISPRO (75-25 MIX) 100 UNIT/ML ~~LOC~~ SUSP: SUBCUTANEOUS | 5 refills | Status: DC

## 2016-05-12 MED ORDER — LISINOPRIL 5 MG PO TABS: 5.0000 mg | ORAL_TABLET | Freq: Every day | ORAL | 3 refills | Status: DC

## 2016-05-12 MED ORDER — CALCIUM CARBONATE-VITAMIN D 500-200 MG-UNIT PO TABS: 1.0000 | ORAL_TABLET | Freq: Every day | ORAL | 11 refills | Status: DC

## 2016-05-12 MED ORDER — BUMETANIDE 0.5 MG PO TABS: 0.5000 mg | ORAL_TABLET | Freq: Every day | ORAL | 3 refills | Status: DC

## 2016-05-12 NOTE — Assessment & Plan Note (Signed)
Well-controlled.  Continue current regimen. 

## 2016-05-12 NOTE — Assessment & Plan Note (Signed)
Referred to local neurology Current under care of neurology in [place] and receiving every 3 month botox injections

## 2016-05-12 NOTE — Progress Notes (Signed)
Subjective:  Patient ID: Heidi [name], female    DOB: August 27, 1950  Age: 65 y.o. MRN: [id number]  CC: Diabetes   HPI Mehar Pellissier has CHF, DM2, CKD, OSA, asthma, HTN presents for   1. CHRONIC DIABETES  Disease Monitoring  Blood Sugar Ranges:   Fasting: 109-140   Post prandial   Polyuria: no   Visual problems: no   Medication Compliance: yes, but not compliant with scale, taking more, taking 30-60 U of novolog 70/30 BID   Medication Side Effects  Hypoglycemia: yes, 2 days ago down to 68  After morning exercise   [place] Exam: done in   Foot Exam: due in January   Diet pattern: eat started eating more carbs will tighten back up   Exercise: yes, 5 days per week   2. HTN: she is still taking demadex 2.5 mg daily despite being advised not to. She prefers this over HCTZ as she feels it is better for her edema. She is compliant with atenolol 25 mg BID and lisinopril 5 mg daily. No CP or SOB. No dizziness or lightheadedness.   3. Pain management: was established with pain management in [place] for chronic  neck pain and headache. She misplaced her Vicodin prescription and was out for one month. She is currently managing her pain with alcohol and topical pain relief gel. She request referral to pain management and neurology locally.   Social History  Substance Use Topics  . Smoking status: Never Smoker  . Smokeless tobacco: Never Used  . Alcohol use No    Outpatient Medications Prior to Visit  Medication Sig Dispense Refill  . albuterol (PROVENTIL HFA;VENTOLIN HFA) 108 (90 BASE) MCG/ACT inhaler Inhale 2 puffs into the lungs every 6 (six) hours as needed for wheezing or shortness of breath. 1 Inhaler 3  . albuterol (PROVENTIL) (2.5 MG/3ML) 0.083% nebulizer solution INHALE CONTENTS OF 1 VIAL VIA NEBULIZER EVERY 6 HOURS AS NEEDED FOR WHEEZING OR SHORTNESS OF BREATH 450 mL 1  . amoxicillin (AMOXIL) 500 MG capsule Take 1 capsule (500 mg total) by mouth 3 (three)  times daily. (Patient not taking: Reported on 05/07/2016) 30 capsule 0  . Ascorbic Acid (VITAMIN C PO) Take 1 tablet by mouth daily.    Marland Kitchen aspirin 81 MG chewable tablet Chew 81 mg by mouth daily.     Marland Kitchen atenolol (TENORMIN) 25 MG tablet Take 1 tablet (25 mg total) by mouth 2 (two) times daily. 60 tablet 3  . atorvastatin (LIPITOR) 40 MG tablet TAKE 1 TABLET BY MOUTH DAILY AT 6 PM 90 tablet 1  . bumetanide (BUMEX) 0.5 MG tablet Take 1 tablet (0.5 mg total) by mouth daily. 30 tablet 2  . calcium-vitamin D (OSCAL WITH D) 500-200 MG-UNIT tablet Take 1 tablet by mouth daily. (Patient not taking: Reported on 05/07/2016) 30 tablet 11  . Fluticasone-Salmeterol (ADVAIR DISKUS) 500-50 MCG/DOSE AEPB Inhale 1 puff into the lungs 2 (two) times daily. 60 each 5  . FREESTYLE TEST STRIPS test strip USE AS DIRECTED (Patient not taking: Reported on 05/07/2016) 100 each 3  . HYDROcodone-acetaminophen (NORCO) 7.5-325 MG per tablet Take 1 tablet by mouth every 6 (six) hours as needed for moderate pain or severe pain.   0  . insulin lispro protamine-lispro (HUMALOG 75/25 MIX) (75-25) 100 UNIT/ML SUSP injection Take twice daily based on sliding scale, 15 U for pre meal CBG 90-110,  20 U for 11-150, 30 U for 151-200, 40 U for > 200 50 mL  5  . INSULIN SYRINGE 1CC/29G 29G X 1/2" 1 ML MISC USE THREE TIMES DAILY AS DIRECTED (Patient not taking: Reported on 05/07/2016) 100 each 5  . Lancets (FREESTYLE) lancets 1 each by Other route 3 (three) times daily. Use as instructed (Patient not taking: Reported on 05/07/2016) 100 each 12  . lisinopril (PRINIVIL,ZESTRIL) 5 MG tablet Take 1 tablet (5 mg total) by mouth daily. 90 tablet 3  . montelukast (SINGULAIR) 10 MG tablet Take 1 tablet (10 mg total) by mouth daily. 90 tablet 3  . Multiple Vitamin (MULTIVITAMIN WITH MINERALS) TABS tablet Take 1 tablet by mouth daily.    . nitroGLYCERIN (NITROSTAT) 0.3 MG SL tablet Place 0.3 mg under the tongue every 5 (five) minutes as needed for chest pain.      Marland Kitchen omeprazole (PRILOSEC) 40 MG capsule Take 1 capsule (40 mg total) by mouth daily. 30 capsule 11  . potassium chloride (K-DUR) 10 MEQ tablet Take 1 tablet (10 mEq total) by mouth daily. 90 tablet 3  . pregabalin (LYRICA) 50 MG capsule Take 50 mg by mouth 2 (two) times daily. Reported on 11/14/2015    . PREMARIN vaginal cream INSERT 0.5 GRAMS INTRAVAGINALLY TWICE WEEKLY, MONDAY AND THURSDAY 30 g 0  . topiramate (TOPAMAX) 50 MG tablet Take 50 mg by mouth 2 (two) times daily.  2   No facility-administered medications prior to visit.     ROS Review of Systems  Constitutional: Positive for fatigue. Negative for chills and fever.  Eyes: Negative for visual disturbance.  Respiratory: Negative for shortness of breath.   Cardiovascular: Negative for chest pain.  Gastrointestinal: Negative for abdominal pain and blood in stool.  Genitourinary:       Request UA for slight urinary irritation   Musculoskeletal: Positive for back pain and neck pain. Negative for arthralgias.  Skin: Negative for rash.  Allergic/Immunologic: Negative for immunocompromised state.  Neurological: Positive for headaches. Negative for dizziness.  Hematological: Negative for adenopathy. Does not bruise/bleed easily.  Psychiatric/Behavioral: Negative for dysphoric mood and suicidal ideas.    Objective:  BP 123/78 (BP Location: Right Arm, Patient Position: Sitting, Cuff Size: Small)   Pulse 77   Temp 97.6 F (36.4 C) (Oral)   Ht 5\' 3"  (1.6 m)   Wt 214 lb 12.8 oz (97.4 kg)   SpO2 93%   BMI 38.05 kg/m   BP/Weight 05/12/2016 05/07/2016 123456  Systolic BP [id number]  Diastolic BP 78 70 63  Wt. (Lbs) 214.8 213.9 213  BMI 38.05 37.89 37.73   Physical Exam  Constitutional: She is oriented to person, place, and time. She appears well-developed and well-nourished. No distress.  Obese   HENT:  Head: Normocephalic and atraumatic.  Cardiovascular: Normal rate, regular rhythm, normal heart sounds and intact distal  pulses.   Pulmonary/Chest: Effort normal and breath sounds normal.  Musculoskeletal: She exhibits no edema.  Neurological: She is alert and oriented to person, place, and time.  Skin: Skin is warm and dry. No rash noted.  Psychiatric: She has a normal mood and affect.   Lab Results  Component Value Date   HGBA1C 9.1 01/10/2016   Lab Results  Component Value Date   HGBA1C 9.5 05/12/2016    CBG 209   UA:  Assessment & Plan:   Georgiann was seen today for diabetes.  Diagnoses and all orders for this visit:  Uncontrolled type 2 diabetes mellitus with stage 2 chronic kidney disease, with long-term current use of insulin ([place]) -  POCT glucose (manual entry) -     POCT glycosylated hemoglobin (Hb A1C)  Type 2 diabetes mellitus with stage 2 chronic kidney disease, with long-term current use of insulin ([facility]) -     Cancel: BASIC METABOLIC PANEL WITH GFR -     Discontinue: insulin lispro protamine-lispro (HUMALOG 75/25 MIX) (75-25) 100 UNIT/ML SUSP injection; Inject subcutaneously 50 U in the morning with food and 40 U at night -     insulin lispro protamine-lispro (HUMALOG 75/25 MIX) (75-25) 100 UNIT/ML SUSP injection; Inject subcutaneously 50 U in the morning with food and 40 U at night -     POCT urinalysis dipstick  Essential hypertension -     COMPLETE METABOLIC PANEL WITH GFR -     lisinopril (PRINIVIL,ZESTRIL) 5 MG tablet; Take 1 tablet (5 mg total) by mouth daily. -     bumetanide (BUMEX) 0.5 MG tablet; Take 1 tablet (0.5 mg total) by mouth daily.  Type 2 diabetes mellitus with stage 2 chronic kidney disease, unspecified long term insulin use status ([facility]) -     lisinopril (PRINIVIL,ZESTRIL) 5 MG tablet; Take 1 tablet (5 mg total) by mouth daily.  Post-menopausal -     calcium-vitamin D (OSCAL WITH D) 500-200 MG-UNIT tablet; Take 1 tablet by mouth daily.  Primary osteoarthritis of both knees -     Ambulatory referral to Pain Clinic  Neck pain -     Ambulatory referral to  Pain Clinic  Chronic tension-type headache, not intractable -     Ambulatory referral to Neurology  Renal cyst, right -     US Renal; Future    No orders of the defined types were placed in this encounter.   Follow-up: Return in about 3 months (around 08/12/2016) for DM2 and HTN .   Boykin Nearing MD

## 2016-05-12 NOTE — Assessment & Plan Note (Signed)
A: slight decline P: Reduce carbs Continue exercise Change insulin to 50 U in AM and 40 U in AM Goal to avoid AM hyperglycemia  Goal to avoid early afternoon hypoglycemia

## 2016-05-12 NOTE — Patient Instructions (Addendum)
Heidi [name] was seen today for diabetes.  Diagnoses and all orders for this visit:  Uncontrolled type 2 diabetes mellitus with stage 2 chronic kidney disease, with long-term current use of insulin ([facility]) -     POCT glucose (manual entry) -     POCT glycosylated hemoglobin (Hb A1C)  Type 2 diabetes mellitus with stage 2 chronic kidney disease, with long-term current use of insulin ([facility]) -     Cancel: BASIC METABOLIC PANEL WITH GFR -     insulin lispro protamine-lispro (HUMALOG 75/25 MIX) (75-25) 100 UNIT/ML SUSP injection; Inject subcutaneously 50 U in the morning with food and 40 U at night  Essential hypertension -     COMPLETE METABOLIC PANEL WITH GFR -     lisinopril (PRINIVIL,ZESTRIL) 5 MG tablet; Take 1 tablet (5 mg total) by mouth daily. -     bumetanide (BUMEX) 0.5 MG tablet; Take 1 tablet (0.5 mg total) by mouth daily.  Type 2 diabetes mellitus with stage 2 chronic kidney disease, unspecified long term insulin use status (HCC) -     lisinopril (PRINIVIL,ZESTRIL) 5 MG tablet; Take 1 tablet (5 mg total) by mouth daily.  Post-menopausal -     calcium-vitamin D (OSCAL WITH D) 500-200 MG-UNIT tablet; Take 1 tablet by mouth daily.  Primary osteoarthritis of both knees -     Ambulatory referral to Pain Clinic  Neck pain -     Ambulatory referral to Pain Clinic  Chronic tension-type headache, not intractable -     Ambulatory referral to Neurology

## 2016-05-12 NOTE — Assessment & Plan Note (Signed)
Per report No records available F/u renal ultrasound ordered

## 2016-05-12 NOTE — Assessment & Plan Note (Signed)
Chronic pain Referred to local pain management

## 2016-05-13 LAB — COMPLETE METABOLIC PANEL WITH GFR
ALT: 21 U/L (ref 6–29)
AST: 19 U/L (ref 10–35)
Albumin: 4.1 g/dL (ref 3.6–5.1)
Alkaline Phosphatase: 91 U/L (ref 33–130)
BUN: 15 mg/dL (ref 7–25)
CO2: 25 mmol/L (ref 20–31)
Calcium: 9.5 mg/dL (ref 8.6–10.4)
Chloride: 103 mmol/L (ref 98–110)
Creat: 1.14 mg/dL — ABNORMAL HIGH (ref 0.50–0.99)
GFR, Est African American: 59 mL/min — ABNORMAL LOW (ref >=60)
GFR, Est Non African American: 51 mL/min — ABNORMAL LOW (ref >=60)
Glucose, Bld: 180 mg/dL — ABNORMAL HIGH (ref 65–99)
Potassium: 3.6 mmol/L (ref 3.5–5.3)
Sodium: 138 mmol/L (ref 135–146)
Total Bilirubin: 0.3 mg/dL (ref 0.2–1.2)
Total Protein: 6.4 g/dL (ref 6.1–8.1)

## 2016-05-18 ENCOUNTER — Ambulatory Visit (HOSPITAL_COMMUNITY): Payer: Medicare Other

## 2016-05-19 ENCOUNTER — Other Ambulatory Visit: Payer: Self-pay | Admitting: Family Medicine

## 2016-05-20 ENCOUNTER — Telehealth: Payer: Self-pay

## 2016-05-20 NOTE — Telephone Encounter (Signed)
Pt was called and informed of lab results. 

## 2016-05-21 ENCOUNTER — Telehealth: Payer: Self-pay | Admitting: Family Medicine

## 2016-05-21 ENCOUNTER — Encounter: Payer: Self-pay | Admitting: Family Medicine

## 2016-05-21 ENCOUNTER — Ambulatory Visit: Payer: Medicare Other | Attending: Family Medicine | Admitting: Family Medicine

## 2016-05-21 VITALS — BP 124/81 | HR 75 | Temp 97.7°F | Ht 63.0 in | Wt 216.4 lb

## 2016-05-21 DIAGNOSIS — I2510 Atherosclerotic heart disease of native coronary artery without angina pectoris: Secondary | ICD-10-CM

## 2016-05-21 DIAGNOSIS — S40261A Insect bite (nonvenomous) of right shoulder, initial encounter: Secondary | ICD-10-CM

## 2016-05-21 DIAGNOSIS — Z7982 Long term (current) use of aspirin: Secondary | ICD-10-CM | POA: Insufficient documentation

## 2016-05-21 DIAGNOSIS — IMO0002 Reserved for concepts with insufficient information to code with codable children: Secondary | ICD-10-CM

## 2016-05-21 DIAGNOSIS — Z794 Long term (current) use of insulin: Secondary | ICD-10-CM | POA: Insufficient documentation

## 2016-05-21 DIAGNOSIS — E1165 Type 2 diabetes mellitus with hyperglycemia: Secondary | ICD-10-CM | POA: Diagnosis not present

## 2016-05-21 DIAGNOSIS — W57XXXA Bitten or stung by nonvenomous insect and other nonvenomous arthropods, initial encounter: Secondary | ICD-10-CM | POA: Insufficient documentation

## 2016-05-21 DIAGNOSIS — N182 Chronic kidney disease, stage 2 (mild): Secondary | ICD-10-CM | POA: Diagnosis not present

## 2016-05-21 DIAGNOSIS — L089 Local infection of the skin and subcutaneous tissue, unspecified: Secondary | ICD-10-CM

## 2016-05-21 DIAGNOSIS — S40861A Insect bite (nonvenomous) of right upper arm, initial encounter: Secondary | ICD-10-CM

## 2016-05-21 DIAGNOSIS — E1122 Type 2 diabetes mellitus with diabetic chronic kidney disease: Secondary | ICD-10-CM | POA: Diagnosis not present

## 2016-05-21 LAB — GLUCOSE, POCT (MANUAL RESULT ENTRY): POC GLUCOSE: 182 mg/dL — AB (ref 70–99)

## 2016-05-21 NOTE — Patient Instructions (Addendum)
Heidi [name] was seen today for insect bite.  Diagnoses and all orders for this visit:  Uncontrolled type 2 diabetes mellitus with stage 2 chronic kidney disease, with long-term current use of insulin ([facility]) -     POCT glucose (manual entry)  Nonvenomous bug bite of shoulder or upper arm, infected, right, initial encounter  no sign of infection or severe reaction Ice for soreness  F/u in 3 months for diabetes   Dr. Adrian Blackwater  Insect Bite, Adult An insect bite can make your skin red, itchy, and swollen. Some insects can spread disease to people with a bite. However, most insect bites do not lead to disease, and most are not serious. Follow these instructions at home: Bite area care  Do not scratch the bite area.  Keep the bite area clean and dry.  Wash the bite area every day with soap and water as told by your doctor.  Check the bite area every day for signs of infection. Check for:  More redness, swelling, or pain.  Fluid or blood.  Warmth.  Pus. Managing pain, itching, and swelling  You may put any of these on the bite area as told by your doctor:  A baking soda paste.  Cortisone cream.  Calamine lotion.  If directed, put ice on the bite area.  Put ice in a plastic bag.  Place a towel between your skin and the bag.  Leave the ice on for 20 minutes, 2-3 times a day. Medicines  Take medicines or put medicines on your skin only as told by your doctor.  If you were prescribed an antibiotic medicine, use it as told by your doctor. Do not stop using the antibiotic even if your condition improves. General instructions  Keep all follow-up visits as told by your doctor. This is important. How is this prevented? To help you have a lower risk of insect bites:  When you are outside, wear clothing that covers your arms and legs.  Use insect repellent. The best insect repellents have:  An active ingredient of DEET, picaridin, oil of lemon eucalyptus (OLE), or  IR3535.  Higher amounts of DEET or another active ingredient than other repellents have.  If your home windows do not have screens, think about putting some in. Contact a doctor if:  You have more redness, swelling, or pain in the bite area.  You have fluid, blood, or pus coming from the bite area.  The bite area feels warm.  You have a fever. Get help right away if:  You have joint pain.  You have a rash.  You have shortness of breath.  You feel more tired or sleepy than you normally do.  You have neck pain.  You have a headache.  You feel weaker than you normally do.  You have chest pain.  You have pain in your belly.  You feel sick to your stomach (nauseous) or you throw up (vomit). Summary  An insect bite can make your skin red, itchy, and swollen.  Do not scratch the bite area, and keep it clean and dry.  Ice can help with pain and itching from the bite. This information is not intended to replace advice given to you by your health care provider. Make sure you discuss any questions you have with your health care provider. Document Released: 06/19/2000 Document Revised: 01/23/2016 Document Reviewed: 11/07/2014 Elsevier Interactive Patient Education  2017 [employer].

## 2016-05-21 NOTE — Progress Notes (Signed)
Pt is here today for bu bite on right arm.

## 2016-05-21 NOTE — Assessment & Plan Note (Signed)
Recent bug bite No ulcer or serious reaction  Reassurance Ice for discomfort Benadryl as needed if hives return

## 2016-05-21 NOTE — Progress Notes (Signed)
Subjective:  Patient ID: Heidi [name], female    DOB: Apr 04, 1951  Age: 65 y.o. MRN: [id number]  CC: Insect Bite   HPI Nil Anstett presents for    1. Insect bite right arm: occurred at 12:35 PM today. R medial upper arm. She developed a hive. Treated with alcohol. No redness or swelling now. Still a little soreness.   Social History  Substance Use Topics  . Smoking status: Never Smoker  . Smokeless tobacco: Never Used  . Alcohol use No    Outpatient Medications Prior to Visit  Medication Sig Dispense Refill  . albuterol (PROVENTIL HFA;VENTOLIN HFA) 108 (90 BASE) MCG/ACT inhaler Inhale 2 puffs into the lungs every 6 (six) hours as needed for wheezing or shortness of breath. 1 Inhaler 3  . albuterol (PROVENTIL) (2.5 MG/3ML) 0.083% nebulizer solution INHALE CONTENTS OF 1 VIAL VIA NEBULIZER EVERY 6 HOURS AS NEEDED FOR WHEEZING OR SHORTNESS OF BREATH 450 mL 1  . Ascorbic Acid (VITAMIN C PO) Take 1 tablet by mouth daily.    Marland Kitchen aspirin 81 MG chewable tablet Chew 81 mg by mouth daily.     Marland Kitchen atenolol (TENORMIN) 25 MG tablet Take 1 tablet (25 mg total) by mouth 2 (two) times daily. 60 tablet 3  . atorvastatin (LIPITOR) 40 MG tablet TAKE 1 TABLET BY MOUTH DAILY AT 6 PM 90 tablet 1  . bumetanide (BUMEX) 0.5 MG tablet Take 1 tablet (0.5 mg total) by mouth daily. 90 tablet 3  . calcium-vitamin D (OSCAL WITH D) 500-200 MG-UNIT tablet Take 1 tablet by mouth daily. 30 tablet 11  . Fluticasone-Salmeterol (ADVAIR DISKUS) 500-50 MCG/DOSE AEPB Inhale 1 puff into the lungs 2 (two) times daily. 60 each 5  . insulin lispro protamine-lispro (HUMALOG 75/25 MIX) (75-25) 100 UNIT/ML SUSP injection Inject subcutaneously 50 U in the morning with food and 40 U at night 30 mL 5  . lisinopril (PRINIVIL,ZESTRIL) 5 MG tablet Take 1 tablet (5 mg total) by mouth daily. 90 tablet 3  . montelukast (SINGULAIR) 10 MG tablet Take 1 tablet (10 mg total) by mouth daily. 90 tablet 3  . Multiple Vitamin (MULTIVITAMIN WITH  MINERALS) TABS tablet Take 1 tablet by mouth daily.    . nitroGLYCERIN (NITROSTAT) 0.3 MG SL tablet Place 0.3 mg under the tongue every 5 (five) minutes as needed for chest pain.     Marland Kitchen omeprazole (PRILOSEC) 40 MG capsule Take 1 capsule (40 mg total) by mouth daily. 30 capsule 11  . potassium chloride (K-DUR) 10 MEQ tablet Take 1 tablet (10 mEq total) by mouth daily. 90 tablet 3  . pregabalin (LYRICA) 50 MG capsule Take 50 mg by mouth 2 (two) times daily. Reported on 11/14/2015    . PREMARIN vaginal cream INSERT 0.5 GRAMS INTRAVAGINALLY TWICE WEEKLY, MONDAY AND THURSDAY 30 g 0  . topiramate (TOPAMAX) 50 MG tablet Take 50 mg by mouth 2 (two) times daily.  2  . FREESTYLE TEST STRIPS test strip USE AS DIRECTED (Patient not taking: Reported on 05/21/2016) 100 each 3  . HYDROcodone-acetaminophen (NORCO) 7.5-325 MG per tablet Take 1 tablet by mouth every 6 (six) hours as needed for moderate pain or severe pain.   0  . INSULIN SYRINGE 1CC/29G 29G X 1/2" 1 ML MISC USE THREE TIMES DAILY AS DIRECTED (Patient not taking: Reported on 05/21/2016) 100 each 5  . Lancets (FREESTYLE) lancets 1 each by Other route 3 (three) times daily. Use as instructed (Patient not taking: Reported on 05/21/2016) 100 each 12  No facility-administered medications prior to visit.     ROS Review of Systems  Constitutional: Negative for chills and fever.  Eyes: Negative for visual disturbance.  Respiratory: Negative for shortness of breath.   Cardiovascular: Negative for chest pain.  Gastrointestinal: Negative for abdominal pain and blood in stool.  Musculoskeletal: Negative for arthralgias and back pain.  Skin: Negative for rash.  Allergic/Immunologic: Negative for immunocompromised state.  Hematological: Negative for adenopathy. Does not bruise/bleed easily.  Psychiatric/Behavioral: Negative for dysphoric mood and suicidal ideas.    Objective:  BP 124/81 (BP Location: Left Arm, Patient Position: Sitting, Cuff Size: Large)    Pulse 75   Temp 97.7 F (36.5 C) (Oral)   Ht 5\' 3"  (1.6 m)   Wt 216 lb 6.4 oz (98.2 kg)   SpO2 93%   BMI 38.33 kg/m   BP/Weight 05/21/2016 05/12/2016 123456  Systolic BP [id number]  Diastolic BP 81 78 70  Wt. (Lbs) 216.4 214.8 213.9  BMI 38.33 38.05 37.89   Physical Exam  Constitutional: She is oriented to person, place, and time. She appears well-developed and well-nourished. No distress.  HENT:  Head: Normocephalic and atraumatic.  Cardiovascular: Normal rate, regular rhythm, normal heart sounds and intact distal pulses.   Pulmonary/Chest: Effort normal and breath sounds normal.  Musculoskeletal: She exhibits no edema.       Arms: Neurological: She is alert and oriented to person, place, and time.  Skin: Skin is warm and dry. No rash noted.  Psychiatric: She has a normal mood and affect.   Lab Results  Component Value Date   HGBA1C 9.5 05/12/2016   CBG 182   Assessment & Plan:   Yakita was seen today for insect bite.  Diagnoses and all orders for this visit:  Uncontrolled type 2 diabetes mellitus with stage 2 chronic kidney disease, with long-term current use of insulin ([facility]) -     POCT glucose (manual entry)  Nonvenomous bug bite of shoulder or upper arm, infected, right, initial encounter    No orders of the defined types were placed in this encounter.   Follow-up: Return in about 3 months (around 08/21/2016) for diabetes .   Boykin Nearing MD

## 2016-05-21 NOTE — Telephone Encounter (Signed)
Pt. Called to let her PCP know that she is taking Baclofen 10mg   For her pain.  She takes 1 tablet every night as needed. Please f/u

## 2016-05-22 ENCOUNTER — Ambulatory Visit (HOSPITAL_COMMUNITY)
Admission: RE | Admit: 2016-05-22 | Discharge: 2016-05-22 | Disposition: A | Discharge status: Home / Self Care | Payer: Medicare Other | Source: Ambulatory Visit | Attending: Family Medicine | Admitting: Family Medicine

## 2016-05-22 DIAGNOSIS — N281 Cyst of kidney, acquired: Secondary | ICD-10-CM | POA: Diagnosis not present

## 2016-06-02 ENCOUNTER — Telehealth: Payer: Self-pay

## 2016-06-02 NOTE — Telephone Encounter (Signed)
Pt was called and informed of lab results. 

## 2016-06-06 ENCOUNTER — Other Ambulatory Visit: Payer: Self-pay | Admitting: Family Medicine

## 2016-06-06 DIAGNOSIS — J45909 Unspecified asthma, uncomplicated: Secondary | ICD-10-CM

## 2016-06-15 ENCOUNTER — Encounter (HOSPITAL_COMMUNITY): Payer: Self-pay | Admitting: Emergency Medicine

## 2016-06-15 ENCOUNTER — Other Ambulatory Visit: Payer: Self-pay | Admitting: Family Medicine

## 2016-06-15 ENCOUNTER — Emergency Department (HOSPITAL_COMMUNITY)
Admission: EM | Admit: 2016-06-15 | Discharge: 2016-06-16 | Disposition: A | Discharge status: Home / Self Care | Payer: Medicare Other | Attending: Emergency Medicine | Admitting: Emergency Medicine

## 2016-06-15 ENCOUNTER — Emergency Department (HOSPITAL_COMMUNITY): Payer: Medicare Other

## 2016-06-15 ENCOUNTER — Ambulatory Visit: Payer: Medicare Other | Admitting: Neurology

## 2016-06-15 DIAGNOSIS — J45909 Unspecified asthma, uncomplicated: Secondary | ICD-10-CM | POA: Insufficient documentation

## 2016-06-15 DIAGNOSIS — R739 Hyperglycemia, unspecified: Secondary | ICD-10-CM

## 2016-06-15 DIAGNOSIS — E1165 Type 2 diabetes mellitus with hyperglycemia: Secondary | ICD-10-CM | POA: Insufficient documentation

## 2016-06-15 DIAGNOSIS — J069 Acute upper respiratory infection, unspecified: Secondary | ICD-10-CM | POA: Diagnosis not present

## 2016-06-15 DIAGNOSIS — Z794 Long term (current) use of insulin: Secondary | ICD-10-CM | POA: Insufficient documentation

## 2016-06-15 DIAGNOSIS — Z79899 Other long term (current) drug therapy: Secondary | ICD-10-CM | POA: Diagnosis not present

## 2016-06-15 DIAGNOSIS — E1122 Type 2 diabetes mellitus with diabetic chronic kidney disease: Secondary | ICD-10-CM | POA: Insufficient documentation

## 2016-06-15 DIAGNOSIS — Z7982 Long term (current) use of aspirin: Secondary | ICD-10-CM | POA: Insufficient documentation

## 2016-06-15 DIAGNOSIS — I130 Hypertensive heart and chronic kidney disease with heart failure and stage 1 through stage 4 chronic kidney disease, or unspecified chronic kidney disease: Secondary | ICD-10-CM | POA: Insufficient documentation

## 2016-06-15 DIAGNOSIS — I509 Heart failure, unspecified: Secondary | ICD-10-CM | POA: Insufficient documentation

## 2016-06-15 DIAGNOSIS — N183 Chronic kidney disease, stage 3 (moderate): Secondary | ICD-10-CM | POA: Insufficient documentation

## 2016-06-15 DIAGNOSIS — R079 Chest pain, unspecified: Secondary | ICD-10-CM | POA: Diagnosis not present

## 2016-06-15 LAB — CBC
HCT: 38.6 % (ref 36.0–46.0)
Hemoglobin: 12.6 g/dL (ref 12.0–15.0)
MCH: 28.2 pg (ref 26.0–34.0)
MCHC: 32.6 g/dL (ref 30.0–36.0)
MCV: 86.4 fL (ref 78.0–100.0)
PLATELETS: 219 10*3/uL (ref 150–400)
RBC: 4.47 MIL/uL (ref 3.87–5.11)
RDW: 14 % (ref 11.5–15.5)
WBC: 6.9 10*3/uL (ref 4.0–10.5)

## 2016-06-15 LAB — I-STAT TROPONIN, ED: TROPONIN I, POC: 0 ng/mL (ref 0.00–0.08)

## 2016-06-15 LAB — URINALYSIS, ROUTINE W REFLEX MICROSCOPIC
Bacteria, UA: NONE SEEN
Bilirubin Urine: NEGATIVE
HGB URINE DIPSTICK: NEGATIVE
Ketones, ur: NEGATIVE mg/dL
Leukocytes, UA: NEGATIVE
Nitrite: NEGATIVE
PROTEIN: NEGATIVE mg/dL
SPECIFIC GRAVITY, URINE: 1.018 (ref 1.005–1.030)
pH: 6 (ref 5.0–8.0)

## 2016-06-15 LAB — BASIC METABOLIC PANEL
Anion gap: 10 (ref 5–15)
BUN: 21 mg/dL — AB (ref 6–20)
CALCIUM: 9.6 mg/dL (ref 8.9–10.3)
CHLORIDE: 102 mmol/L (ref 101–111)
CO2: 25 mmol/L (ref 22–32)
CREATININE: 1.41 mg/dL — AB (ref 0.44–1.00)
GFR calc non Af Amer: 38 mL/min — ABNORMAL LOW (ref >60)
GFR, EST AFRICAN AMERICAN: 45 mL/min — AB (ref >60)
GLUCOSE: 348 mg/dL — AB (ref 65–99)
Potassium: 3.4 mmol/L — ABNORMAL LOW (ref 3.5–5.1)
Sodium: 137 mmol/L (ref 135–145)

## 2016-06-15 LAB — CBG MONITORING, ED
GLUCOSE-CAPILLARY: 251 mg/dL — AB (ref 65–99)
Glucose-Capillary: 334 mg/dL — ABNORMAL HIGH (ref 65–99)

## 2016-06-15 MED ORDER — SODIUM CHLORIDE 0.9 % IV BOLUS (SEPSIS)
500.0000 mL | Freq: Once | INTRAVENOUS | Status: AC
  Administered 2016-06-15: 500 mL via INTRAVENOUS

## 2016-06-15 MED ORDER — SODIUM CHLORIDE 0.9 % IV BOLUS (SEPSIS): 1000.0000 mL | Freq: Once | INTRAVENOUS | Status: DC

## 2016-06-15 MED ORDER — ACETAMINOPHEN 500 MG PO TABS
1000.0000 mg | ORAL_TABLET | Freq: Once | ORAL | Status: DC
  Filled 2016-06-15: qty 2

## 2016-06-15 MED ORDER — HYDROCODONE-ACETAMINOPHEN 5-325 MG PO TABS
2.0000 | ORAL_TABLET | Freq: Once | ORAL | Status: AC
Start: 2016-06-15 — End: 2016-06-15
  Administered 2016-06-15: 2 via ORAL
  Filled 2016-06-15: qty 2

## 2016-06-15 NOTE — ED Provider Notes (Signed)
[facility] DEPT Provider Note   CSN: [id number] Arrival date & time: 06/15/16  1644     History   Chief Complaint Chief Complaint  Patient presents with  . Hyperglycemia  . Wound Check    HPI Lashonia Henjum is a 65 y.o. female.  Patient is a 65 year old female with a history of asthma CHF diabetes and hypertension who presents with multiple complaints. She states her sugars have been elevated since this morning. She feels like she is coming down with some infection and asked the reason her sugars have been high. She has a little bit of nasal congestion and she feels like she might have an inner ear infection. She has a little bit of discomfort in her left ear. She has a cough which is nonproductive. She denies any known fevers. She did have some chest pain this morning which was off and on throughout the day but currently denies any chest pain. She states that she has a little bit of discomfort in her left big toe and feels like it might be infected. She's had some intermittent nausea. She denies any abdominal pain. She's having normal bowel movements. She denies any urinary symptoms. She feels like she needs a Z-Pak for an infection.      Past Medical History:  Diagnosis Date  . Anemia   . Asthma   . CHF (congestive heart failure) ([place])   . Diabetes mellitus without complication ([place])   . Hypertension     Patient Active Problem List   Diagnosis Date Noted  . Nonvenomous bug bite of shoulder or upper arm, infected, right, initial encounter 05/21/2016  . Post-menopausal atrophic vaginitis 11/14/2015  . Pain, dental 07/23/2015  . Primary osteoarthritis of both knees 07/18/2015  . De Quervain's tenosynovitis, right 05/08/2015  . Chronic headache 05/08/2015  . GERD (gastroesophageal reflux disease) 05/08/2015  . Renal cyst, right 05/08/2015  . Adenomatous polyp of colon 03/26/2015  . HTN (hypertension) 02/12/2015  . Cognitive deficit due to old head trauma 02/12/2015  .  Asthma, chronic 02/12/2015  . Chronic left shoulder pain 02/11/2015  . Thyroid nodule 12/18/2014  . Neck pain   . HLD (hyperlipidemia)   . OSA treated with BiPAP 11/27/2014  . Obesity hypoventilation syndrome ([place]) 11/27/2014  . Elevated troponin   . Demand ischemia ([place])   . Diabetes type 2, uncontrolled ([place])   . Cardiomegaly 11/25/2014  . CHF (congestive heart failure) ([place]) 11/25/2014  . DM type 2 causing CKD stage 2 ([place]) 11/25/2014    Past Surgical History:  Procedure Laterality Date  . ABDOMINAL HYSTERECTOMY      OB History    No data available       Home Medications    Prior to Admission medications   Medication Sig Start Date End Date Taking? Authorizing Provider  albuterol (PROVENTIL HFA;VENTOLIN HFA) 108 (90 BASE) MCG/ACT inhaler Inhale 2 puffs into the lungs every 6 (six) hours as needed for wheezing or shortness of breath. 12/31/14   Arnoldo Morale, MD  albuterol (PROVENTIL) (2.5 MG/3ML) 0.083% nebulizer solution INHALE CONTENTS OF 1 VIAL VIA NEBULIZER EVERY 6 HOURS AS NEEDED FOR WHEEZING OR SHORTNESS OF BREATH 07/10/15   Josalyn Funches, MD  Ascorbic Acid (VITAMIN C PO) Take 1 tablet by mouth daily.    Historical Provider, MD  aspirin 81 MG chewable tablet Chew 81 mg by mouth daily.     Historical Provider, MD  atenolol (TENORMIN) 25 MG tablet Take 1 tablet (25 mg total) by mouth 2 (  two) times daily. 11/14/15   Josalyn Funches, MD  atorvastatin (LIPITOR) 40 MG tablet TAKE 1 TABLET BY MOUTH DAILY AT 6 PM 03/16/16   Josalyn Funches, MD  baclofen (LIORESAL) 10 MG tablet Take 10 mg by mouth 1 day or 1 dose.    Historical Provider, MD  bumetanide (BUMEX) 0.5 MG tablet Take 1 tablet (0.5 mg total) by mouth daily. 05/12/16   Josalyn Funches, MD  calcium-vitamin D (OSCAL WITH D) 500-200 MG-UNIT tablet Take 1 tablet by mouth daily. 05/12/16   Josalyn Funches, MD  Fluticasone-Salmeterol (ADVAIR DISKUS) 500-50 MCG/DOSE AEPB Inhale 1 puff into the lungs 2 (two) times daily. 04/15/15    Josalyn Funches, MD  FREESTYLE TEST STRIPS test strip USE AS DIRECTED Patient not taking: Reported on 05/21/2016 01/08/16   Boykin Nearing, MD  HYDROcodone-acetaminophen ([place]) 7.5-325 MG per tablet Take 1 tablet by mouth every 6 (six) hours as needed for moderate pain or severe pain.  11/19/14   Historical Provider, MD  insulin lispro protamine-lispro (HUMALOG 75/25 MIX) (75-25) 100 UNIT/ML SUSP injection Inject subcutaneously 50 U in the morning with food and 40 U at night 05/12/16   Josalyn Funches, MD  INSULIN SYRINGE 1CC/29G 29G X 1/2" 1 ML MISC USE THREE TIMES DAILY AS DIRECTED Patient not taking: Reported on 05/21/2016 02/17/16   Boykin Nearing, MD  Lancets (FREESTYLE) lancets 1 each by Other route 3 (three) times daily. Use as instructed Patient not taking: Reported on 05/21/2016 09/20/15   Boykin Nearing, MD  lisinopril (PRINIVIL,ZESTRIL) 5 MG tablet Take 1 tablet (5 mg total) by mouth daily. 05/12/16   Josalyn Funches, MD  montelukast (SINGULAIR) 10 MG tablet TAKE 1 TABLET BY MOUTH EVERY DAY 06/08/16   Boykin Nearing, MD  Multiple Vitamin (MULTIVITAMIN WITH MINERALS) TABS tablet Take 1 tablet by mouth daily.    Historical Provider, MD  nitroGLYCERIN (NITROSTAT) 0.3 MG SL tablet Place 0.3 mg under the tongue every 5 (five) minutes as needed for chest pain.     Historical Provider, MD  omeprazole (PRILOSEC) 40 MG capsule Take 1 capsule (40 mg total) by mouth daily. 11/14/15   Josalyn Funches, MD  potassium chloride (K-DUR) 10 MEQ tablet Take 1 tablet (10 mEq total) by mouth daily. 11/14/15   Josalyn Funches, MD  pregabalin (LYRICA) 50 MG capsule Take 50 mg by mouth 2 (two) times daily. Reported on 11/14/2015 07/30/15   Historical Provider, MD  PREMARIN vaginal cream INSERT 0.5 GRAMS INTRAVAGINALLY TWICE WEEKLY, MONDAY AND THURSDAY 05/19/16   Boykin Nearing, MD  topiramate (TOPAMAX) 50 MG tablet Take 50 mg by mouth 2 (two) times daily. 11/01/14   Historical Provider, MD    Family History Family  History  Problem Relation Age of Onset  . Colon cancer Father   . Diabetes type II Sister   . Diabetes type II Brother   . Colon cancer Sister     Social History Social History  Substance Use Topics  . Smoking status: Never Smoker  . Smokeless tobacco: Never Used  . Alcohol use No     Allergies   Patient has no known allergies.   Review of Systems Review of Systems  Constitutional: Positive for fatigue. Negative for chills, diaphoresis and fever.  HENT: Positive for congestion and rhinorrhea. Negative for sneezing.   Eyes: Negative.   Respiratory: Positive for cough. Negative for chest tightness and shortness of breath.   Cardiovascular: Positive for chest pain. Negative for leg swelling.  Gastrointestinal: Positive for nausea. Negative for abdominal  pain, blood in stool, diarrhea and vomiting.  Genitourinary: Negative for difficulty urinating, flank pain, frequency and hematuria.  Musculoskeletal: Positive for arthralgias. Negative for back pain.  Skin: Negative for rash.  Neurological: Negative for dizziness, speech difficulty, weakness, numbness and headaches.     Physical Exam Updated Vital Signs BP 113/55   Pulse 72   Temp 98.2 F (36.8 C) (Oral)   Resp 18   Ht 5\' 3"  (1.6 m)   Wt 213 lb (96.6 kg)   SpO2 93%   BMI 37.73 kg/m   Physical Exam  Constitutional: She is oriented to person, place, and time. She appears well-developed and well-nourished.  HENT:  Head: Normocephalic and atraumatic.  Right Ear: External ear normal.  Left Ear: External ear normal.  Mouth/Throat: Oropharynx is clear and moist.  Eyes: Pupils are equal, round, and reactive to light.  Neck: Normal range of motion. Neck supple.  Cardiovascular: Normal rate, regular rhythm and normal heart sounds.   Pulmonary/Chest: Effort normal and breath sounds normal. No respiratory distress. She has no wheezes. She has no rales. She exhibits no tenderness.  Abdominal: Soft. Bowel sounds are normal.  There is no tenderness. There is no rebound and no guarding.  Musculoskeletal: Normal range of motion. She exhibits no edema.  Patient's left toe has no evidence of infection. There is no swelling. No erythema. No drainage. No wounds. Pedal pulses are intact. She has 1+ nonpitting edema bilaterally. No calf pain.  Lymphadenopathy:    She has no cervical adenopathy.  Neurological: She is alert and oriented to person, place, and time.  Skin: Skin is warm and dry. No rash noted.  Psychiatric: She has a normal mood and affect.     ED Treatments / Results  Labs (all labs ordered are listed, but only abnormal results are displayed) Labs Reviewed  BASIC METABOLIC PANEL - Abnormal; Notable for the following:       Result Value   Potassium 3.4 (*)    Glucose, Bld 348 (*)    BUN 21 (*)    Creatinine, Ser 1.41 (*)    GFR calc non Af Amer 38 (*)    GFR calc Af Amer 45 (*)    All other components within normal limits  URINALYSIS, ROUTINE W REFLEX MICROSCOPIC - Abnormal; Notable for the following:    APPearance HAZY (*)    Glucose, UA >=500 (*)    Squamous Epithelial / LPF 0-5 (*)    All other components within normal limits  CBG MONITORING, ED - Abnormal; Notable for the following:    Glucose-Capillary 334 (*)    All other components within normal limits  CBG MONITORING, ED - Abnormal; Notable for the following:    Glucose-Capillary 251 (*)    All other components within normal limits  CBC  I-STAT TROPOININ, ED    EKG  EKG Interpretation  Date/Time:  Monday June 15 2016 17:22:32 EST Ventricular Rate:  70 PR Interval:  196 QRS Duration: 80 QT Interval:  388 QTC Calculation: 419 R Axis:   -30 Text Interpretation:  Normal sinus rhythm Left axis deviation Low voltage QRS Possible Anterolateral infarct , age undetermined Abnormal ECG since last tracing no significant change Confirmed by Lataria Courser  MD, Romen Yutzy ([id number]) on 06/15/2016 9:46:07 PM       Radiology Dg Chest 2  View  Result Date: 06/15/2016 CLINICAL DATA:  Chest pain and weakness EXAM: CHEST  2 VIEW COMPARISON:  Chest CT 04/09/2015 FINDINGS: Cardiomediastinal contours are normal. No  pneumothorax or pleural effusion. No focal airspace consolidation or pulmonary edema. IMPRESSION: Clear lungs. Electronically Signed   By: Ulyses Jarred M.D.   On: 06/15/2016 21:23    Procedures Procedures (including critical care time)  Medications Ordered in ED Medications  acetaminophen (TYLENOL) tablet 1,000 mg (1,000 mg Oral Refused 06/15/16 2240)  HYDROcodone-acetaminophen (NORCO/VICODIN) 5-325 MG per tablet 2 tablet (not administered)  sodium chloride 0.9 % bolus 500 mL (500 mLs Intravenous New Bag/Given 06/15/16 2209)     Initial Impression / Assessment and Plan / ED Course  I have reviewed the triage vital signs and the nursing notes.  Pertinent labs & imaging results that were available during my care of the patient were reviewed by me and considered in my medical decision making (see chart for details).  Clinical Course     Patient presents with multiple complaints. Essentially she is concerned that her blood sugars are more elevated than they normally are. She feels like she has an infection. I don't see any suggestions of a bacterial infection. There is no evidence of cellulitis to her toe. There is no evidence of pneumonia. There is no UTI. She has some mild URI symptoms. Her sugar has come down nicely in the ED. She has had some nonspecific chest pain today. Her troponin is negative. There is no ischemic changes on EKG. He was discharged home in good condition. She did request a dose of pain medication for her chronic back pain prior to discharge. Return precautions were given. She was encouraged to follow-up with her PCP.  Final Clinical Impressions(s) / ED Diagnoses   Final diagnoses:  Hyperglycemia  Viral upper respiratory tract infection    New Prescriptions New Prescriptions   No  medications on file     Malvin Johns, MD 06/15/16 2334

## 2016-06-15 NOTE — ED Triage Notes (Signed)
Pt. Stated, My sugar has been up and down, its been around 468 and feeling hot  And I also have an infection on my left big toe.  Im also having chest pain with some weakness, and it feels like my Insulin is not working.

## 2016-06-15 NOTE — ED Notes (Signed)
Patient to be picked up by family member.

## 2016-06-16 NOTE — Telephone Encounter (Signed)
HCTZ refill request denied Patient takes bumex

## 2016-06-22 ENCOUNTER — Encounter: Payer: Self-pay | Admitting: Neurology

## 2016-06-22 ENCOUNTER — Ambulatory Visit (INDEPENDENT_AMBULATORY_CARE_PROVIDER_SITE_OTHER): Payer: Medicare Other | Admitting: Neurology

## 2016-06-22 DIAGNOSIS — G43709 Chronic migraine without aura, not intractable, without status migrainosus: Secondary | ICD-10-CM | POA: Diagnosis not present

## 2016-06-22 DIAGNOSIS — I2510 Atherosclerotic heart disease of native coronary artery without angina pectoris: Secondary | ICD-10-CM

## 2016-06-22 NOTE — Progress Notes (Signed)
[facility]    Provider:  Dr Jaynee Eagles Referring Provider: Boykin Nearing, MD Primary Care Physician:  Minerva Ends, MD  CC:  Chronic headaches  HPI:  Heidi [name] is a 65 y.o. female here as a referral from Dr. Adrian Blackwater for chronic headaches, PMHx chronic migraines,  she has been traveling to [facility] to get botox last one in September. Botox helps a lot. She takes Topamax which helps as well. She was taking hydrocodone for her headaches as well but she came off of that and not taking that anymore. No medication overuse. No aura. She is currently on atenolol, topiramate and baclofen for migraines. Her headaches start on the left and spread to the whole scalp it is painful, pressure and pounding and hurts, with light and sound sensitivity. Started in 01/2014 after a fall. She hit her head. She has daily headaches still with >8 migrainous however the botox significantly helped reduce her headache burden by 100 hours a month and she would like to continue here. Migraines used to be more frequent almost every day and last longer up to the whole day, now only 8 a month. She still has daily headaches for a few hours but it used to be all day. Triggers are being active. She is having more headaches now since her botox is wearing off. Before the headaches she was having daily severe headaches all day long. No aura. She used to have sleep apnea but lost a lot of weight and denies snoring or daily somnolence. No other focal neurologic deficits or concerns.  Reviewed notes, labs and imaging from outside physicians, which showed:  Personally reviewed images and agree with the following:   MRI cervical spine 04/2016:  Vertebrae: The vertebral body heights maintained. Abnormal heterogeneous appearance of the vertebral body bone marrow, similar to previous study. Again, finding is like related to red marrow conversion related to underlying chronic anemia or possibly hypoxemia.  Lymphoproliferative disorder could also have this appearance.  Cord: Signal intensity within the cervical spinal cord is normal.  Posterior Fossa, vertebral arteries, paraspinal tissues: Probable chronic microvascular ischemic changes partially visualized within the pons. Partially visualized brain and posterior fossa are otherwise unremarkable. Craniocervical junction within normal limits. Paraspinous and prevertebral soft tissues within normal limits. Major intracranial vascular flow voids maintained within the vertebral arteries.  Disc levels:  C2-C3: Negative.  C3-C4: Mild bilateral uncovertebral hypertrophy with minimal disc bulge. No significant stenosis.  C4-C5: Degenerative intervertebral disc space narrowing with diffuse disc bulge. Mild bilateral uncovertebral hypertrophy. No significant stenosis.  C5-C6: Degenerative intervertebral disc space narrowing with diffuse disc bulge and bilateral uncovertebral spurring. Resultant moderate bilateral foraminal narrowing, right worse than left. Changes are similar to previous. Broad-based posterior disc osteophyte flattens the ventral thecal sac without significant canal stenosis.  C6-C7: Degenerative intervertebral disc space narrowing with bilateral uncovertebral spurring. Mild bilateral foraminal narrowing. No significant canal stenosis.  C7-T1:  Negative.  The partially visualized upper thoracic spine demonstrates no significant degenerative changes are stenosis.  IMPRESSION: 1. Stable appearance of the cervical spine with multilevel degenerative spondylolysis extending from C4-5 through C6-7, greatest at C5-6 where there is moderate bilateral foraminal stenosis. 2. Abnormal marrow, likely related to red marrow reconversion due to chronic anemia or hypoxemia, similar to previous. Again, lymphoproliferative disorder could also have this appearance.  MRI brain 2015(report only)  IMPRESSION:  1. No  discernible MRI evidence of an acute or subacute intracranial process.  2. Mild senescent changes, as above (2nd paragraph)--typical  for age.  Result Narrative  MRI of the brain with and without gadolinium enhancement, 06/27/2014 11:30 AM.  History: Severe headache; new bilateral arm pain; hypoattenuating focus on CT of the brain obtained 10 May 2014, as yet incompletely characterized.  Technique: Multiplanar, multisequence images of the brain were obtained with and without gadolinium enhancement. 12 cc of Gadavist were administered.  Comparison: CT, 10 May 2014.   FINDINGS:  There is no MRI correlate to the finding on the CT of 10 May 2014--this instead pertain to artifact related to slice selection.  There are moderately extensive supratentorial and pontine white matter signal abnormalities consequent to chronic microvascular ischemic demyelination. There is slight bilateral frontal lobe predominant volume loss.  There is no compelling MRI evidence of masses, edema, infarction, hemorrhage, extra-axial fluid collections, midline abnormality, hydrocephalus, herniation, or unexpected enhancement. The major vascular structures are grossly unremarkable.  There is mild mucosal thickening throughout the ethmoid air cells. The left frontal sinus is hypoplastic, a normal variant. The paranasal sinuses are otherwise well-aerated as are the middle tympanic cavities and the mastoid air cells.    BMP 06/2016: BUN 21, creatinine 1.41, cbc nml  Review of Systems: Patient complains of symptoms per HPI as well as the following symptoms: no CP, no SOB. Pertinent negatives per HPI. All others negative.   Social History   Social History  . Marital status: Legally Separated    Spouse name: N/A  . Number of children: 4  . Years of education: N/A   Occupational History  . Disabled    Social History Main Topics  . Smoking status: Never Smoker  . Smokeless tobacco: Never Used    . Alcohol use No  . Drug use: No  . Sexual activity: Not on file   Other Topics Concern  . Not on file   Social History Narrative   Lives with daughter   Caffeine use: 1 cup coffee per day    Family History  Problem Relation Age of Onset  . Colon cancer Father   . Diabetes type II Sister   . Diabetes type II Brother   . Colon cancer Sister   . Migraines Neg Hx     Past Medical History:  Diagnosis Date  . Anemia   . Anxiety   . Asthma   . CHF (congestive heart failure) ([place])   . Diabetes mellitus without complication ([place])   . High cholesterol   . Hypertension     Past Surgical History:  Procedure Laterality Date  . ABDOMINAL HYSTERECTOMY    . CESAREAN SECTION      Current Outpatient Prescriptions  Medication Sig Dispense Refill  . albuterol (PROVENTIL HFA;VENTOLIN HFA) 108 (90 BASE) MCG/ACT inhaler Inhale 2 puffs into the lungs every 6 (six) hours as needed for wheezing or shortness of breath. 1 Inhaler 3  . albuterol (PROVENTIL) (2.5 MG/3ML) 0.083% nebulizer solution INHALE CONTENTS OF 1 VIAL VIA NEBULIZER EVERY 6 HOURS AS NEEDED FOR WHEEZING OR SHORTNESS OF BREATH 450 mL 1  . Ascorbic Acid (VITAMIN C PO) Take 1 tablet by mouth daily.    Marland Kitchen aspirin 81 MG chewable tablet Chew 81 mg by mouth daily.     Marland Kitchen atenolol (TENORMIN) 25 MG tablet Take 1 tablet (25 mg total) by mouth 2 (two) times daily. 60 tablet 3  . atorvastatin (LIPITOR) 40 MG tablet TAKE 1 TABLET BY MOUTH DAILY AT 6 PM 90 tablet 1  . baclofen (LIORESAL) 10 MG tablet Take 10 mg by mouth  1 day or 1 dose.    . bumetanide (BUMEX) 0.5 MG tablet Take 1 tablet (0.5 mg total) by mouth daily. 90 tablet 3  . calcium-vitamin D (OSCAL WITH D) 500-200 MG-UNIT tablet Take 1 tablet by mouth daily. 30 tablet 11  . Fluticasone-Salmeterol (ADVAIR DISKUS) 500-50 MCG/DOSE AEPB Inhale 1 puff into the lungs 2 (two) times daily. 60 each 5  . FREESTYLE TEST STRIPS test strip USE AS DIRECTED 100 each 3  . insulin lispro  protamine-lispro (HUMALOG 75/25 MIX) (75-25) 100 UNIT/ML SUSP injection Inject subcutaneously 50 U in the morning with food and 40 U at night 30 mL 5  . INSULIN SYRINGE 1CC/29G 29G X 1/2" 1 ML MISC USE THREE TIMES DAILY AS DIRECTED 100 each 5  . Lancets (FREESTYLE) lancets 1 each by Other route 3 (three) times daily. Use as instructed 100 each 12  . lisinopril (PRINIVIL,ZESTRIL) 5 MG tablet Take 1 tablet (5 mg total) by mouth daily. 90 tablet 3  . montelukast (SINGULAIR) 10 MG tablet TAKE 1 TABLET BY MOUTH EVERY DAY 90 tablet 0  . Multiple Vitamin (MULTIVITAMIN WITH MINERALS) TABS tablet Take 1 tablet by mouth daily.    . nitroGLYCERIN (NITROSTAT) 0.3 MG SL tablet Place 0.3 mg under the tongue every 5 (five) minutes as needed for chest pain.     Marland Kitchen omeprazole (PRILOSEC) 40 MG capsule Take 1 capsule (40 mg total) by mouth daily. 30 capsule 11  . potassium chloride (K-DUR) 10 MEQ tablet Take 1 tablet (10 mEq total) by mouth daily. 90 tablet 3  . PREMARIN vaginal cream INSERT 0.5 GRAMS INTRAVAGINALLY TWICE WEEKLY, MONDAY AND THURSDAY 30 g 0  . topiramate (TOPAMAX) 50 MG tablet Take 50 mg by mouth 2 (two) times daily.  2   No current facility-administered medications for this visit.     Allergies as of 06/22/2016  . (No Known Allergies)    Vitals: BP 129/76 (BP Location: Right Arm, Patient Position: Sitting, Cuff Size: Large)   Pulse 86   Ht 5\' 3"  (1.6 m)   Wt 212 lb 12.8 oz (96.5 kg)   BMI 37.70 kg/m  Last Weight:  Wt Readings from Last 1 Encounters:  06/22/16 212 lb 12.8 oz (96.5 kg)   Last Height:   Ht Readings from Last 1 Encounters:  06/22/16 5\' 3"  (1.6 m)    Physical exam: Exam: Gen: NAD, conversant, well nourised, obese, well groomed                     CV: RRR, no MRG. No Carotid Bruits. No peripheral edema, warm, nontender Eyes: Conjunctivae clear without exudates or hemorrhage  Neuro: Detailed Neurologic Exam  Speech:    Speech is normal; fluent and spontaneous with  normal comprehension.  Cognition:    The patient is oriented to person, place, and time;     recent and remote memory intact;     language fluent;     normal attention, concentration,     fund of knowledge Cranial Nerves:    The pupils are equal, round, and reactive to light. The fundi are normal and spontaneous venous pulsations are present. Visual fields are full to finger confrontation. Extraocular movements are intact. Trigeminal sensation is intact and the muscles of mastication are normal. The face is symmetric. The palate elevates in the midline. Hearing intact. Voice is normal. Shoulder shrug is normal. The tongue has normal motion without fasciculations.   Coordination:    Normal finger to nose  and heel to shin. Normal rapid alternating movements.   Gait:    Heel-toe and tandem gait are normal.   Motor Observation:    No asymmetry, no atrophy, and no involuntary movements noted. Tone:    Normal muscle tone.    Posture:    Posture is normal. normal erect    Strength:    Strength is V/V in the upper and lower limbs.      Sensation: intact to LT     Reflex Exam:  DTR's:    Deep tendon reflexes in the  lower extremities are hyporeflexic bilaterally.   Toes:    The toes are downgoing bilaterally.   Clonus:    Clonus is absent.      Assessment/Plan:  65 year old with chronic migraines doing well on Topiramate, atenolol, baclofen and botox for migraine  - continue medications - will request botox for migraine protocol be completed here instead of her driving to [place] every 3 months - discussed: To prevent or relieve headaches, try the following: Cool Compress. Lie down and place a cool compress on your head.  Avoid headache triggers. If certain foods or odors seem to have triggered your migraines in the past, avoid them. A headache diary might help you identify triggers.  Include physical activity in your daily routine. Try a daily walk or other moderate aerobic  exercise.  Manage stress. Find healthy ways to cope with the stressors, such as delegating tasks on your to-do list.  Practice relaxation techniques. Try deep breathing, yoga, massage and visualization.  Eat regularly. Eating regularly scheduled meals and maintaining a healthy diet might help prevent headaches. Also, drink plenty of fluids.  Follow a regular sleep schedule. Sleep deprivation might contribute to headaches Consider biofeedback. With this mind-body technique, you learn to control certain bodily functions - such as muscle tension, heart rate and blood pressure - to prevent headaches or reduce headache pain.    Proceed to emergency room if you experience new or worsening symptoms or symptoms do not resolve, if you have new neurologic symptoms or if headache is severe, or for any concerning symptom.   Cc: Boykin Nearing, MD  Sarina Ill, MD  [facility] [address] [place], [place] [address]  Phone [phone] Fax [phone]

## 2016-06-22 NOTE — Patient Instructions (Signed)
Remember to drink plenty of fluid, eat healthy meals and do not skip any meals. Try to eat protein with a every meal and eat a healthy snack such as fruit or nuts in between meals. Try to keep a regular sleep-wake schedule and try to exercise daily, particularly in the form of walking, 20-30 minutes a day, if you can.   As far as your medications are concerned, I would like to suggest: We wil get botox approved and call you, I will have out botox person reach out to you tomorrow thanks  I would like to see you back for botox, sooner if we need to. Please call us with any interim questions, concerns, problems, updates or refill requests.   Our phone number is [phone]. We also have an after hours call service for urgent matters and there is a physician on-call for urgent questions. For any emergencies you know to call 911 or go to the nearest emergency room

## 2016-06-27 ENCOUNTER — Encounter: Payer: Self-pay | Admitting: Neurology

## 2016-06-27 DIAGNOSIS — I5030 Unspecified diastolic (congestive) heart failure: Secondary | ICD-10-CM | POA: Insufficient documentation

## 2016-06-27 DIAGNOSIS — G43709 Chronic migraine without aura, not intractable, without status migrainosus: Secondary | ICD-10-CM | POA: Insufficient documentation

## 2016-07-02 ENCOUNTER — Telehealth: Payer: Self-pay | Admitting: Neurology

## 2016-07-02 NOTE — Telephone Encounter (Signed)
Called and spoke to patient relayed Heidi [name] was out out of the office until 07/08/2016. Patient relayed to please call her buy 07/09/2016. Botox Apt.

## 2016-07-02 NOTE — Telephone Encounter (Signed)
Pt called wanting to schedule botox appt Pt said the HA's are getting worse, she should have had botox on 12/13 (she was not a pt of Dr Jaynee Eagles at that time).

## 2016-07-08 NOTE — Telephone Encounter (Signed)
This patient was just submitted for Botox on 12/19. I called the patient 4 times this morning to schedule injection but kept receiving a busy signal on the phone with no option for VM. I will try calling her again later today.

## 2016-07-14 ENCOUNTER — Ambulatory Visit (INDEPENDENT_AMBULATORY_CARE_PROVIDER_SITE_OTHER): Payer: Medicare Other | Admitting: Neurology

## 2016-07-14 VITALS — BP 124/66 | HR 80 | Ht 63.0 in

## 2016-07-14 DIAGNOSIS — G43709 Chronic migraine without aura, not intractable, without status migrainosus: Secondary | ICD-10-CM | POA: Diagnosis not present

## 2016-07-14 NOTE — Progress Notes (Signed)
Botox-100unitsx2 vials Lot: [id number] Expiration: 12/2018 NDC: [id number]  0.9% Bacteriostatic Sodium Chloride- 67mL total [id number] Expiration: 12/04/2017 NDC: [id number]   Dx: [id number] B/B

## 2016-07-14 NOTE — Patient Instructions (Signed)
Remember to drink plenty of fluid, eat healthy meals and do not skip any meals. Try to eat protein with a every meal and eat a healthy snack such as fruit or nuts in between meals. Try to keep a regular sleep-wake schedule and try to exercise daily, particularly in the form of walking, 20-30 minutes a day, if you can.   I would like to see you back in 12 weeks, sooner if we need to. Please call us with any interim questions, concerns, problems, updates or refill requests.   Our phone number is [phone]. We also have an after hours call service for urgent matters and there is a physician on-call for urgent questions. For any emergencies you know to call 911 or go to the nearest emergency room

## 2016-07-15 NOTE — Progress Notes (Signed)
Consent Form Botulism Toxin Injection For Chronic Migraine  Botulism toxin has been approved by the Federal drug administration for treatment of chronic migraine. Botulism toxin does not cure chronic migraine and it may not be effective in some patients.  The administration of botulism toxin is accomplished by injecting a small amount of toxin into the muscles of the neck and head. Dosage must be titrated for each individual. Any benefits resulting from botulism toxin tend to wear off after 3 months with a repeat injection required if benefit is to be maintained. Injections are usually done every 3-4 months with maximum effect peak achieved by about 2 or 3 weeks. Botulism toxin is expensive and you should be sure of what costs you will incur resulting from the injection.  The side effects of botulism toxin use for chronic migraine may include:   -Transient, and usually mild, facial weakness with facial injections  -Transient, and usually mild, head or neck weakness with head/neck injections  -Reduction or loss of forehead facial animation due to forehead muscle weakness  -Eyelid drooping  -Dry eye  -Pain at the site of injection or bruising at the site of injection  -Double vision  -Potential unknown long term risks  Contraindications: You should not have Botox if you are pregnant, nursing, allergic to albumin, have an infection, skin condition, or muscle weakness at the site of the injection, or have myasthenia gravis, Lambert-Eaton syndrome, or ALS.  It is also possible that as with any injection, there may be an allergic reaction or no effect from the medication. Reduced effectiveness after repeated injections is sometimes seen and rarely infection at the injection site may occur. All care will be taken to prevent these side effects. If therapy is given over a long time, atrophy and wasting in the muscle injected may occur. Occasionally the patient's become refractory to treatment because  they develop antibodies to the toxin. In this event, therapy needs to be modified.  I have read the above information and consent to the administration of botulism toxin.  On file  ______________  _____   _________________  Patient signature     Date   Witness signature       BOTOX PROCEDURE NOTE FOR MIGRAINE HEADACHE    Contraindications and precautions discussed with patient(above). Aseptic procedure was observed and patient tolerated procedure. Procedure performed by Dr. Georgia Dom  The condition has existed for more than 6 months, and pt does not have a diagnosis of ALS, Myasthenia Gravis or Lambert-Eaton Syndrome. Risks and benefits of injections discussed and pt agrees to proceed with the procedure. Written consent obtained  These injections are medically necessary. He receives good benefits from these injections. These injections do not cause sedations or hallucinations which the oral therapies may cause.  Indication/Diagnosis: chronic migraine BOTOX(J0585) injection was performed according to protocol by [employer]. 200 units of BOTOX was dissolved into 4 cc NS.  NDC: [id number]  Type of toxin: Botox  Botox-100unitsx2 vials Lot: [id number] Expiration: 12/2018 NDC: [id number]  0.9% Bacteriostatic Sodium Chloride- 33mL total Lot:[id number] Expiration: 12/04/2017 NDC: [id number]   Dx: [id number] B/B           Description of procedure:  The patient was placed in a sitting position. The standard protocol was used for Botox as follows, with 5 units of Botox injected at each site:   -Procerus muscle, midline injection  -Corrugator muscle, bilateral injection  -Frontalis muscle, bilateral injection, with 2 sites each side, medial injection was performed in  the upper one third of the frontalis muscle, in the region vertical from the medial inferior edge of the superior orbital rim. The lateral injection was again in the upper one third of the  forehead vertically above the lateral limbus of the cornea, 1.5 cm lateral to the medial injection site.  -Temporalis muscle injection, 4 sites, bilaterally. The first injection was 3 cm above the tragus of the ear, second injection site was 1.5 cm to 3 cm up from the first injection site in line with the tragus of the ear. The third injection site was 1.5-3 cm forward between the first 2 injection sites. The fourth injection site was 1.5 cm posterior to the second injection site.  -Occipitalis muscle injection, 3 sites, bilaterally. The first injection was done one half way between the occipital protuberance and the tip of the mastoid process behind the ear. The second injection site was done lateral and superior to the first, 1 fingerbreadth from the first injection. The third injection site was 1 fingerbreadth superiorly and medially from the first injection site.  -Cervical paraspinal muscle injection, 2 sites, bilateral knee first injection site was 1 cm from the midline of the cervical spine, 3 cm inferior to the lower border of the occipital protuberance. The second injection site was 1.5 cm superiorly and laterally to the first injection site.  -Trapezius muscle injection was performed at 3 sites, bilaterally. The first injection site was in the upper trapezius muscle halfway between the inflection point of the neck, and the acromion. The second injection site was one half way between the acromion and the first injection site. The third injection was done between the first injection site and the inflection point of the neck.   Will return for repeat injection in 3 months.   A 200 unit sof Botox was used, 155 units were injected, the rest of the Botox was wasted. The patient tolerated the procedure well, there were no complications of the above procedure.

## 2016-07-21 ENCOUNTER — Other Ambulatory Visit: Payer: Self-pay | Admitting: Family Medicine

## 2016-07-21 DIAGNOSIS — J45909 Unspecified asthma, uncomplicated: Secondary | ICD-10-CM

## 2016-08-03 ENCOUNTER — Telehealth: Payer: Self-pay | Admitting: Family Medicine

## 2016-08-03 MED ORDER — NITROGLYCERIN 0.3 MG SL SUBL: 0.3000 mg | SUBLINGUAL_TABLET | SUBLINGUAL | 0 refills | Status: DC | PRN

## 2016-08-03 NOTE — Telephone Encounter (Signed)
Nitroglycerin refilled

## 2016-08-03 NOTE — Telephone Encounter (Signed)
Pt called requesting medication refill for nitroGLYCERIN (NITROSTAT) 0.3 MG SL tablet Please f/up

## 2016-08-04 ENCOUNTER — Other Ambulatory Visit: Payer: Self-pay | Admitting: Family Medicine

## 2016-08-05 ENCOUNTER — Other Ambulatory Visit: Payer: Self-pay | Admitting: Pharmacist

## 2016-08-05 MED ORDER — GLUCOSE BLOOD VI STRP: ORAL_STRIP | 5 refills | Status: DC

## 2016-08-07 ENCOUNTER — Other Ambulatory Visit: Payer: Self-pay | Admitting: Pharmacist

## 2016-08-07 ENCOUNTER — Telehealth: Payer: Self-pay | Admitting: Family Medicine

## 2016-08-07 ENCOUNTER — Ambulatory Visit: Payer: Medicare Other | Attending: Family Medicine | Admitting: *Deleted

## 2016-08-07 DIAGNOSIS — E1122 Type 2 diabetes mellitus with diabetic chronic kidney disease: Secondary | ICD-10-CM

## 2016-08-07 DIAGNOSIS — E1165 Type 2 diabetes mellitus with hyperglycemia: Secondary | ICD-10-CM

## 2016-08-07 DIAGNOSIS — Z794 Long term (current) use of insulin: Secondary | ICD-10-CM | POA: Diagnosis not present

## 2016-08-07 DIAGNOSIS — N182 Chronic kidney disease, stage 2 (mild): Secondary | ICD-10-CM

## 2016-08-07 DIAGNOSIS — IMO0002 Reserved for concepts with insufficient information to code with codable children: Secondary | ICD-10-CM

## 2016-08-07 LAB — GLUCOSE, POCT (MANUAL RESULT ENTRY): POC GLUCOSE: 115 mg/dL — AB (ref 70–99)

## 2016-08-07 MED ORDER — NITROGLYCERIN 0.3 MG SL SUBL: 0.3000 mg | SUBLINGUAL_TABLET | SUBLINGUAL | 0 refills | Status: DC | PRN

## 2016-08-07 MED ORDER — GLUCOSE BLOOD VI STRP: ORAL_STRIP | 5 refills | Status: DC

## 2016-08-07 NOTE — Telephone Encounter (Signed)
Updated usage of NTG SL tabs

## 2016-08-07 NOTE — Telephone Encounter (Signed)
Patient called and states pharmacy needs clarification on usage per day...  Please follow up with pharmacy, patient is completely out.

## 2016-08-07 NOTE — Progress Notes (Signed)
When patient came in she informed front office personnel that her blood sugar was at 40. Pt brought back to room 1. Ambulated with no difficulty, does not appear to be in distress, alert and oriented. She states she has eaten a few pieces of candy to bring up level. When blood sugar was check, CBG was 115. Pt states she did not take insulin dose because normally she checks her blood sugar before administering insulin and she had 1 strip left to check her blood sugar. Was told by [place] that clarification in directions was needed before she could pick up her prescription. Verified with pharmacy that prescription was available and ready for pick up prior to pt leaving [place].

## 2016-08-11 ENCOUNTER — Ambulatory Visit: Payer: Medicare Other | Attending: Family Medicine | Admitting: Family Medicine

## 2016-08-11 ENCOUNTER — Encounter: Payer: Self-pay | Admitting: Family Medicine

## 2016-08-11 VITALS — BP 152/78 | HR 84 | Temp 97.5°F | Ht 63.0 in | Wt 216.2 lb

## 2016-08-11 DIAGNOSIS — IMO0002 Reserved for concepts with insufficient information to code with codable children: Secondary | ICD-10-CM

## 2016-08-11 DIAGNOSIS — G4733 Obstructive sleep apnea (adult) (pediatric): Secondary | ICD-10-CM | POA: Diagnosis not present

## 2016-08-11 DIAGNOSIS — M542 Cervicalgia: Secondary | ICD-10-CM

## 2016-08-11 DIAGNOSIS — E1165 Type 2 diabetes mellitus with hyperglycemia: Secondary | ICD-10-CM

## 2016-08-11 DIAGNOSIS — R05 Cough: Secondary | ICD-10-CM | POA: Diagnosis not present

## 2016-08-11 DIAGNOSIS — Z7982 Long term (current) use of aspirin: Secondary | ICD-10-CM | POA: Insufficient documentation

## 2016-08-11 DIAGNOSIS — I130 Hypertensive heart and chronic kidney disease with heart failure and stage 1 through stage 4 chronic kidney disease, or unspecified chronic kidney disease: Secondary | ICD-10-CM | POA: Insufficient documentation

## 2016-08-11 DIAGNOSIS — G8929 Other chronic pain: Secondary | ICD-10-CM

## 2016-08-11 DIAGNOSIS — N182 Chronic kidney disease, stage 2 (mild): Secondary | ICD-10-CM

## 2016-08-11 DIAGNOSIS — R51 Headache: Secondary | ICD-10-CM | POA: Diagnosis not present

## 2016-08-11 DIAGNOSIS — E785 Hyperlipidemia, unspecified: Secondary | ICD-10-CM

## 2016-08-11 DIAGNOSIS — Z794 Long term (current) use of insulin: Secondary | ICD-10-CM | POA: Diagnosis not present

## 2016-08-11 DIAGNOSIS — J45909 Unspecified asthma, uncomplicated: Secondary | ICD-10-CM | POA: Diagnosis not present

## 2016-08-11 DIAGNOSIS — I509 Heart failure, unspecified: Secondary | ICD-10-CM | POA: Diagnosis not present

## 2016-08-11 DIAGNOSIS — E1122 Type 2 diabetes mellitus with diabetic chronic kidney disease: Secondary | ICD-10-CM

## 2016-08-11 DIAGNOSIS — I10 Essential (primary) hypertension: Secondary | ICD-10-CM | POA: Diagnosis not present

## 2016-08-11 DIAGNOSIS — R059 Cough, unspecified: Secondary | ICD-10-CM | POA: Insufficient documentation

## 2016-08-11 LAB — LIPID PANEL
CHOLESTEROL: 130 mg/dL (ref <200)
HDL: 63 mg/dL (ref >50)
LDL CALC: 39 mg/dL (ref <100)
TRIGLYCERIDES: 142 mg/dL (ref <150)
Total CHOL/HDL Ratio: 2.1 Ratio (ref <5.0)
VLDL: 28 mg/dL (ref <30)

## 2016-08-11 LAB — GLUCOSE, POCT (MANUAL RESULT ENTRY): POC GLUCOSE: 257 mg/dL — AB (ref 70–99)

## 2016-08-11 LAB — POCT GLYCOSYLATED HEMOGLOBIN (HGB A1C): Hemoglobin A1C: 9.7

## 2016-08-11 MED ORDER — AZITHROMYCIN 250 MG PO TABS: ORAL_TABLET | ORAL | 0 refills | Status: DC

## 2016-08-11 MED ORDER — ACETAMINOPHEN-CODEINE 300-60 MG PO TABS: 1.0000 | ORAL_TABLET | Freq: Three times a day (TID) | ORAL | 0 refills | Status: DC | PRN

## 2016-08-11 NOTE — Assessment & Plan Note (Signed)
Hypertensive in setting of back pain Usually normotensive Plan: Continue current regimen Tylenol #3 for pain

## 2016-08-11 NOTE — Patient Instructions (Addendum)
Heidi [name] was seen today for diabetes.  Diagnoses and all orders for this visit:  Uncontrolled type 2 diabetes mellitus with stage 2 chronic kidney disease, with long-term current use of insulin (HCC) -     POCT glucose (manual entry) -     POCT glycosylated hemoglobin (Hb A1C)  Hyperlipidemia, unspecified hyperlipidemia type -     Lipid Panel  Essential hypertension  Cough -     azithromycin (ZITHROMAX) 250 MG tablet; Take by mouth 500 mg on day one, then 250 mg daily for 4 more days  Chronic neck pain -     acetaminophen-codeine (TYLENOL/CODEINE #4) 300-60 MG tablet; Take 1 tablet by mouth every 8 (eight) hours as needed for pain.   Tylenol #4 for chronic neck pain until you can re-establish with pain management For cold symptoms x 2 weeks, I did order the z-pac, however if symptoms persist or side pains worsen you will need to complete a chest x-ray.  F/u in 4 weeks for BP check and CBG review with clinical pharmacology F/u with me in 3 months   Dr. Adrian Blackwater

## 2016-08-11 NOTE — Assessment & Plan Note (Signed)
Cough x 2 weeks Chills Normal lung exam  Plan: Azithromycin Patient advised to call if symptoms worsen for chest x-ray

## 2016-08-11 NOTE — Progress Notes (Signed)
Subjective:  Patient ID: Heidi [name], female    DOB: 22-Dec-1950  Age: 66 y.o. MRN: [id number]  CC: Diabetes   HPI Heidi [name] has CHF, DM2, CKD, OSA, asthma, HTN presents for   1. CHRONIC DIABETES  Disease Monitoring  Blood Sugar Ranges:   Fasting: 115-212  Post prandial 60-170   Polyuria: no   Visual problems: no   Medication Compliance: yes, but not compliant with scale, taking more, taking 30-60 U of novolog 70/30 BID   Medication Side Effects  Hypoglycemia: yes,down to 24s and  50s  [place] Exam: done in   Foot Exam: done today   Diet pattern: eat started eating more carbs will tighten back up   Exercise: yes, 5 days per week   2. HTN: home readings are 105-130/58-74. She takes demadex 2.5 mg daily. She prefers this over HCTZ as she feels it is better for her edema. She is compliant with atenolol 25 mg BID and lisinopril 5 mg daily. No CP or SOB. No dizziness or lightheadedness. She did have episode of severe pain in back this morning. Took Vicodin, hot shower and rubbing alcohol.   3. Cold symptoms: x 1-2 weeks. Cough, chills. No fever. No chest pain or shortness of breath. She reports R side pains. She request azithromycin. She reports using inhaler right before office visit. She refuses recommendation to complete chest x-ray.   4. Chronic neck pain and headache: request vicodin. Attempting to re-establish with pain management.  She  was established with pain management in [place] for chronic  neck pain and headache. She misplaced her Vicodin prescription and was out for one month. She is currently managing her pain with alcohol and topical pain relief gel.   Social History  Substance Use Topics  . Smoking status: Never Smoker  . Smokeless tobacco: Never Used  . Alcohol use No    Outpatient Medications Prior to Visit  Medication Sig Dispense Refill  . albuterol (PROVENTIL HFA;VENTOLIN HFA) 108 (90 BASE) MCG/ACT inhaler Inhale 2 puffs into  the lungs every 6 (six) hours as needed for wheezing or shortness of breath. 1 Inhaler 3  . albuterol (PROVENTIL) (2.5 MG/3ML) 0.083% nebulizer solution INHALE CONTENTS OF 1 VIAL VIA NEBULIZER EVERY 6 HOURS AS NEEDED FOR WHEEZING OR SHORTNESS OF BREATH 450 mL 1  . Ascorbic Acid (VITAMIN C PO) Take 1 tablet by mouth daily.    Marland Kitchen aspirin 81 MG chewable tablet Chew 81 mg by mouth daily.     Marland Kitchen atenolol (TENORMIN) 25 MG tablet Take 1 tablet (25 mg total) by mouth 2 (two) times daily. 60 tablet 3  . atorvastatin (LIPITOR) 40 MG tablet TAKE 1 TABLET BY MOUTH DAILY AT 6 PM 90 tablet 1  . baclofen (LIORESAL) 10 MG tablet Take 10 mg by mouth 1 day or 1 dose.    . bumetanide (BUMEX) 0.5 MG tablet Take 1 tablet (0.5 mg total) by mouth daily. 90 tablet 3  . calcium-vitamin D (OSCAL WITH D) 500-200 MG-UNIT tablet Take 1 tablet by mouth daily. 30 tablet 11  . dextromethorphan-guaiFENesin (MUCINEX DM) 30-600 MG 12hr tablet Take 1 tablet by mouth as needed for cough.    Marland Kitchen glucose blood (FREESTYLE TEST STRIPS) test strip Use as instructed for 4 times daily testing of blood glucose. 100 each 5  . insulin lispro protamine-lispro (HUMALOG 75/25 MIX) (75-25) 100 UNIT/ML SUSP injection Inject subcutaneously 50 U in the morning with food and 40 U at night 30  mL 5  . INSULIN SYRINGE 1CC/29G 29G X 1/2" 1 ML MISC USE THREE TIMES DAILY AS DIRECTED 100 each 5  . Lancets (FREESTYLE) lancets 1 each by Other route 3 (three) times daily. Use as instructed 100 each 12  . lisinopril (PRINIVIL,ZESTRIL) 5 MG tablet Take 1 tablet (5 mg total) by mouth daily. 90 tablet 3  . montelukast (SINGULAIR) 10 MG tablet TAKE 1 TABLET BY MOUTH EVERY DAY 90 tablet 0  . Multiple Vitamin (MULTIVITAMIN WITH MINERALS) TABS tablet Take 1 tablet by mouth daily.    . nitroGLYCERIN (NITROSTAT) 0.3 MG SL tablet Place 1 tablet (0.3 mg total) under the tongue every 5 (five) minutes as needed for chest pain. Max 3 tablets in 15 minutes 90 tablet 0  . omeprazole  (PRILOSEC) 40 MG capsule Take 1 capsule (40 mg total) by mouth daily. 30 capsule 11  . potassium chloride (K-DUR) 10 MEQ tablet Take 1 tablet (10 mEq total) by mouth daily. 90 tablet 3  . PREMARIN vaginal cream INSERT 0.5 GRAMS INTRAVAGINALLY TWICE WEEKLY, MONDAY AND THURSDAY 30 g 0  . topiramate (TOPAMAX) 50 MG tablet Take 50 mg by mouth 2 (two) times daily.  2   No facility-administered medications prior to visit.     ROS Review of Systems  Constitutional: Positive for chills and fatigue. Negative for fever.  HENT: Positive for congestion.   Eyes: Negative for visual disturbance.  Respiratory: Positive for cough. Negative for shortness of breath.   Cardiovascular: Negative for chest pain.  Gastrointestinal: Negative for abdominal pain and blood in stool.  Musculoskeletal: Positive for back pain and neck pain. Negative for arthralgias.  Skin: Negative for rash.  Allergic/Immunologic: Negative for immunocompromised state.  Neurological: Negative for dizziness and headaches.  Hematological: Negative for adenopathy. Does not bruise/bleed easily.  Psychiatric/Behavioral: Negative for dysphoric mood and suicidal ideas.    Objective:  BP (!) 152/78 (BP Location: Left Arm, Patient Position: Sitting, Cuff Size: Small)   Pulse 84   Temp 97.5 F (36.4 C) (Oral)   Ht 5\' 3"  (1.6 m)   Wt 216 lb 3.2 oz (98.1 kg)   SpO2 93%   BMI 38.30 kg/m   BP/Weight 08/11/2016 07/14/2016 [id number]  Systolic BP 0000000 [id number]  Diastolic BP 78 66 76  Wt. (Lbs) 216.2 - 212.8  BMI 38.3 - 37.7   Physical Exam  Constitutional: She is oriented to person, place, and time. She appears well-developed and well-nourished. No distress.  Obese   HENT:  Head: Normocephalic and atraumatic.  Cardiovascular: Normal rate, regular rhythm, normal heart sounds and intact distal pulses.   Pulmonary/Chest: Effort normal and breath sounds normal.  Musculoskeletal: She exhibits no edema.  Neurological: She is alert and  oriented to person, place, and time.  Skin: Skin is warm and dry. No rash noted.  Psychiatric: She has a normal mood and affect.   Lab Results  Component Value Date   HGBA1C 9.5 05/12/2016   Lab Results  Component Value Date   HGBA1C 9.7 08/11/2016    CBG 257   Assessment & Plan:   Heidi [name] was seen today for diabetes.  Diagnoses and all orders for this visit:  Uncontrolled type 2 diabetes mellitus with stage 2 chronic kidney disease, with long-term current use of insulin (HCC) -     POCT glucose (manual entry) -     POCT glycosylated hemoglobin (Hb A1C)  Hyperlipidemia, unspecified hyperlipidemia type -     Lipid Panel  Essential hypertension  Cough -  azithromycin (ZITHROMAX) 250 MG tablet; Take by mouth 500 mg on day one, then 250 mg daily for 4 more days  Chronic neck pain -     acetaminophen-codeine (TYLENOL/CODEINE #4) 300-60 MG tablet; Take 1 tablet by mouth every 8 (eight) hours as needed for pain.    No orders of the defined types were placed in this encounter.   Follow-up: Return in about 4 weeks (around 09/08/2016) for HTN.   Boykin Nearing MD

## 2016-08-11 NOTE — Assessment & Plan Note (Signed)
Chronic pain Tylenol #4 for pain while she awaits pain management

## 2016-08-11 NOTE — Assessment & Plan Note (Signed)
Decline with rise in A1c and hypoglycemic episodes  Patient refused long acting insulin and change in dosing  Advised low carb diet Exercise

## 2016-08-17 ENCOUNTER — Telehealth: Payer: Self-pay

## 2016-08-17 NOTE — Telephone Encounter (Signed)
Pt was called and informed of lab results and to continue taking Lipitor daily.

## 2016-08-19 ENCOUNTER — Encounter: Payer: Self-pay | Admitting: Internal Medicine

## 2016-08-19 ENCOUNTER — Ambulatory Visit (INDEPENDENT_AMBULATORY_CARE_PROVIDER_SITE_OTHER): Payer: Medicare Other | Admitting: Internal Medicine

## 2016-08-19 VITALS — BP 108/50 | HR 88 | Ht 64.0 in | Wt 216.0 lb

## 2016-08-19 DIAGNOSIS — Z86010 Personal history of colonic polyps: Secondary | ICD-10-CM | POA: Diagnosis not present

## 2016-08-19 DIAGNOSIS — Z794 Long term (current) use of insulin: Secondary | ICD-10-CM

## 2016-08-19 DIAGNOSIS — R194 Change in bowel habit: Secondary | ICD-10-CM

## 2016-08-19 DIAGNOSIS — E119 Type 2 diabetes mellitus without complications: Secondary | ICD-10-CM | POA: Diagnosis not present

## 2016-08-19 DIAGNOSIS — R197 Diarrhea, unspecified: Secondary | ICD-10-CM | POA: Diagnosis not present

## 2016-08-19 MED ORDER — NA SULFATE-K SULFATE-MG SULF 17.5-3.13-1.6 GM/177ML PO SOLN: 1.0000 | Freq: Once | ORAL | 0 refills | Status: AC

## 2016-08-19 NOTE — Progress Notes (Signed)
HISTORY OF PRESENT ILLNESS:  Heidi [name] is a 66 y.o. female with hypertension, asthma, morbid obesity, diabetes mellitus, and renal insufficiency. She was initially evaluated 07/29/2015 to establish as a new patient. She brought records from [facility] and was accompanied by her daughter. See that dictation for details. Impression was personal history of colonic adenoma for which recall in 2018 recommended, family history of colon cancer in her father around age 80s, GERD, history of constipation, and morbid obesity. She was instructed to lose weight and initiate fiber supplementation. Patient reports that she has been dieting and has successfully lost about 25 pounds over the past year. With her weight loss she tells me that she has noticed asymmetry in her left upper quadrant with a "bulge". No pain. She also reports problems with increased loose bowel habits and occasional bleeding from hemorrhoids. She continues with rare intermittent reflux symptoms which are mostly controlled with omeprazole. This has significantly improved with weight loss. No dysphagia. She is concerned over colon cancer and wishes to schedule her colonoscopy  REVIEW OF SYSTEMS:  All non-GI ROS negative except for anxiety, back pain, visual change, confusion, depression, muscle pain, headaches, occasional exertional shortness of breath  Past Medical History:  Diagnosis Date  . Adenomatous colon polyp   . Anemia   . Anxiety   . Asthma   . CHF (congestive heart failure) ([place])   . Diabetes mellitus without complication ([place])   . High cholesterol   . Hypertension     Past Surgical History:  Procedure Laterality Date  . ABDOMINAL HYSTERECTOMY    . CESAREAN SECTION      Social History Daniel Desjardin  reports that she has never smoked. She has never used smokeless tobacco. She reports that she does not drink alcohol or use drugs.  family history includes Colon cancer in her father and sister; Diabetes type II in  her brother and sister.  No Known Allergies     PHYSICAL EXAMINATION: Vital signs: BP (!) 108/50 (BP Location: Left Arm, Patient Position: Sitting, Cuff Size: Large)   Pulse 88   Ht 5\' 4"  (1.626 m) Comment: height measured without shoes  Wt 216 lb (98 kg)   BMI 37.08 kg/m   Constitutional: generally well-appearing, no acute distress Psychiatric: alert and oriented x3, cooperative Eyes: extraocular movements intact, anicteric, conjunctiva pink Mouth: oral pharynx moist, no lesions Neck: supple no lymphadenopathy Cardiovascular: heart regular rate and rhythm, no murmur Lungs: clear to auscultation bilaterally Abdomen: soft, Obese, nontender, nondistended, no obvious ascites, no peritoneal signs, normal bowel sounds, no organomegaly. No mass. She confirmed that the "bulge" was adipose tissue Rectal:Deferred until colonoscopy Extremities: no clubbing cyanosis or lower extremity edema bilaterally Skin: no lesions on visible extremities Neuro: No focal deficits. Normal DTRs. No asterixis.    ASSESSMENT:  #1. Personal history of adenomatous colon polyp. Due for surveillance #2. Family history colon cancer in father in 24s #3. Last colonoscopy June 2015 elsewhere #4. Several month history of intermittent loose stools #5. Minor rectal bleeding due to hemorrhoids likely #6. GERD. Controlled with PPI #7. Morbid obesity. Patient has been successful at intentional weight loss   PLAN:  #1. Reflux precautions #2. Continued weight loss #3. Continue PPI. Lowest dose to control symptoms #4. Schedule colonoscopy or colorectal neoplasia surveillance and to evaluate loose bowels and rectal bleeding ears patient is high-risk given her body habitus and insulin requiring diabetes.The nature of the procedure, as well as the risks, benefits, and alternatives were carefully and thoroughly  reviewed with the patient. Ample time for discussion and questions allowed. The patient understood, was  satisfied, and agreed to proceed. #5. Adjust insulin to avoid and wanted hypoglycemia. Told to hold a.m. dosage. Schedule a.m. appointment  40 minutes spent face-to-face with the patient. Greater than 50% a time use for counseling regarding her multiple GI issues and discussing the plan as outlined in detail

## 2016-08-19 NOTE — Patient Instructions (Signed)

## 2016-09-10 ENCOUNTER — Ambulatory Visit: Payer: Medicare Other | Attending: Family Medicine | Admitting: Family Medicine

## 2016-09-10 ENCOUNTER — Encounter: Payer: Self-pay | Admitting: Family Medicine

## 2016-09-10 VITALS — BP 115/68 | HR 82 | Temp 97.8°F | Ht 64.0 in | Wt 218.2 lb

## 2016-09-10 DIAGNOSIS — N182 Chronic kidney disease, stage 2 (mild): Secondary | ICD-10-CM | POA: Insufficient documentation

## 2016-09-10 DIAGNOSIS — I509 Heart failure, unspecified: Secondary | ICD-10-CM | POA: Insufficient documentation

## 2016-09-10 DIAGNOSIS — G4733 Obstructive sleep apnea (adult) (pediatric): Secondary | ICD-10-CM | POA: Diagnosis not present

## 2016-09-10 DIAGNOSIS — R51 Headache: Secondary | ICD-10-CM | POA: Insufficient documentation

## 2016-09-10 DIAGNOSIS — E1122 Type 2 diabetes mellitus with diabetic chronic kidney disease: Secondary | ICD-10-CM | POA: Diagnosis not present

## 2016-09-10 DIAGNOSIS — I10 Essential (primary) hypertension: Secondary | ICD-10-CM

## 2016-09-10 DIAGNOSIS — Z7982 Long term (current) use of aspirin: Secondary | ICD-10-CM | POA: Insufficient documentation

## 2016-09-10 DIAGNOSIS — Z6837 Body mass index (BMI) 37.0-37.9, adult: Secondary | ICD-10-CM | POA: Insufficient documentation

## 2016-09-10 DIAGNOSIS — E669 Obesity, unspecified: Secondary | ICD-10-CM | POA: Insufficient documentation

## 2016-09-10 DIAGNOSIS — M542 Cervicalgia: Secondary | ICD-10-CM | POA: Insufficient documentation

## 2016-09-10 DIAGNOSIS — E1165 Type 2 diabetes mellitus with hyperglycemia: Secondary | ICD-10-CM | POA: Insufficient documentation

## 2016-09-10 DIAGNOSIS — I130 Hypertensive heart and chronic kidney disease with heart failure and stage 1 through stage 4 chronic kidney disease, or unspecified chronic kidney disease: Secondary | ICD-10-CM | POA: Diagnosis not present

## 2016-09-10 DIAGNOSIS — Z794 Long term (current) use of insulin: Secondary | ICD-10-CM | POA: Diagnosis not present

## 2016-09-10 DIAGNOSIS — J45909 Unspecified asthma, uncomplicated: Secondary | ICD-10-CM | POA: Insufficient documentation

## 2016-09-10 DIAGNOSIS — IMO0002 Reserved for concepts with insufficient information to code with codable children: Secondary | ICD-10-CM

## 2016-09-10 DIAGNOSIS — G8929 Other chronic pain: Secondary | ICD-10-CM | POA: Insufficient documentation

## 2016-09-10 LAB — GLUCOSE, POCT (MANUAL RESULT ENTRY): POC Glucose: 248 mg/dl — AB (ref 70–99)

## 2016-09-10 MED ORDER — INSULIN LISPRO PROT & LISPRO (75-25 MIX) 100 UNIT/ML ~~LOC~~ SUSP: SUBCUTANEOUS | 5 refills | Status: DC

## 2016-09-10 MED ORDER — ACETAMINOPHEN-CODEINE 300-60 MG PO TABS: 1.0000 | ORAL_TABLET | Freq: Three times a day (TID) | ORAL | 0 refills | Status: DC | PRN

## 2016-09-10 NOTE — Assessment & Plan Note (Signed)
Change humalog 75/25 to 30 U in AM and 40 U in PM in an attempt to avoid low sugars during the midday

## 2016-09-10 NOTE — Patient Instructions (Addendum)
Heidi [name] was seen today for hypertension and diabetes.  Diagnoses and all orders for this visit:  Uncontrolled type 2 diabetes mellitus with stage 2 chronic kidney disease, with long-term current use of insulin (HCC) -     POCT glucose (manual entry) -     insulin lispro protamine-lispro (HUMALOG 75/25 MIX) (75-25) 100 UNIT/ML SUSP injection; Inject subcutaneously 30 U in the morning with food and 40 U at night  Essential hypertension  Chronic neck pain -     acetaminophen-codeine (TYLENOL/CODEINE #4) 300-60 MG tablet; Take 1 tablet by mouth every 8 (eight) hours as needed for pain.   F/u in 2 months for diabetes and HTN  Dr. Adrian Blackwater

## 2016-09-10 NOTE — Assessment & Plan Note (Signed)
BP is normal Med: compliant Continue current regimen

## 2016-09-10 NOTE — Progress Notes (Signed)
Subjective:  Patient ID: Heidi [name], female    DOB: Nov 29, 1950  Age: 66 y.o. MRN: [id number]  CC: Hypertension and Diabetes   HPI Heidi [name] has CHF, DM2, CKD, OSA, asthma, HTN presents for   1. CHRONIC DIABETES  Disease Monitoring  Blood Sugar Ranges:   Fasting:   Post prandial 60-170   Polyuria: no   Visual problems: no   Medication Compliance: yes, but not compliant with scale, taking more, taking 30-50 U of Humalog 75/25 BID . Reports using a sliding scale.  Medication Side Effects  Hypoglycemia: yes,down to 60s around 11 AM   Heidi [name]: done in   [name]: done today   Diet pattern: eat started eating more carbs will tighten back up   Exercise: yes, 5 days per week   2. HTN: compliant with regimen monitoring BP at home in normal range. No CP or SOB. No dizziness or lightheadedness.   3. Chronic neck pain and headache: request vicodin. Attempting to re-establish with pain management.  She  was established with pain management in [place] for chronic neck pain and headache. She misplaced her Vicodin prescription and was out for one month. She is currently managing her pain with alcohol and topical pain relief gel. She did not fill the Rx for tylenol #4 prescribed at last visit. Current pain 6/10. Pain interferes with activity she is not going to the gym.   Social History  Substance Use Topics  . Smoking status: Never Smoker  . Smokeless tobacco: Never Used  . Alcohol use No    Outpatient Medications Prior to Visit  Medication Sig Dispense Refill  . acetaminophen-codeine (TYLENOL/CODEINE #4) 300-60 MG tablet Take 1 tablet by mouth every 8 (eight) hours as needed for pain. 30 tablet 0  . albuterol (PROVENTIL HFA;VENTOLIN HFA) 108 (90 BASE) MCG/ACT inhaler Inhale 2 puffs into the lungs every 6 (six) hours as needed for wheezing or shortness of breath. 1 Inhaler 3  . albuterol (PROVENTIL) (2.5 MG/3ML) 0.083% nebulizer solution INHALE  CONTENTS OF 1 VIAL VIA NEBULIZER EVERY 6 HOURS AS NEEDED FOR WHEEZING OR SHORTNESS OF BREATH 450 mL 1  . Ascorbic Acid (VITAMIN C PO) Take 1 tablet by mouth daily.    Marland Kitchen aspirin 81 MG chewable tablet Chew 81 mg by mouth daily.     Marland Kitchen atenolol (TENORMIN) 25 MG tablet Take 1 tablet (25 mg total) by mouth 2 (two) times daily. 60 tablet 3  . atorvastatin (LIPITOR) 40 MG tablet TAKE 1 TABLET BY MOUTH DAILY AT 6 PM 90 tablet 1  . bumetanide (BUMEX) 0.5 MG tablet Take 1 tablet (0.5 mg total) by mouth daily. 90 tablet 3  . calcium-vitamin D (OSCAL WITH D) 500-200 MG-UNIT tablet Take 1 tablet by mouth daily. 30 tablet 11  . dextromethorphan-guaiFENesin (MUCINEX DM) 30-600 MG 12hr tablet Take 1 tablet by mouth as needed for cough.    Marland Kitchen glucose blood (FREESTYLE TEST STRIPS) test strip Use as instructed for 4 times daily testing of blood glucose. 100 each 5  . insulin lispro protamine-lispro (HUMALOG 75/25 MIX) (75-25) 100 UNIT/ML SUSP injection Inject subcutaneously 50 U in the morning with food and 40 U at night 30 mL 5  . INSULIN SYRINGE 1CC/29G 29G X 1/2" 1 ML MISC USE THREE TIMES DAILY AS DIRECTED 100 each 5  . Lancets (FREESTYLE) lancets 1 each by Other route 3 (three) times daily. Use as instructed 100 each 12  . lisinopril (PRINIVIL,ZESTRIL) 5 MG  tablet Take 1 tablet (5 mg total) by mouth daily. 90 tablet 3  . montelukast (SINGULAIR) 10 MG tablet TAKE 1 TABLET BY MOUTH EVERY DAY 90 tablet 0  . Multiple Vitamin (MULTIVITAMIN WITH MINERALS) TABS tablet Take 1 tablet by mouth daily.    . nitroGLYCERIN (NITROSTAT) 0.3 MG SL tablet Place 1 tablet (0.3 mg total) under the tongue every 5 (five) minutes as needed for chest pain. Max 3 tablets in 15 minutes 90 tablet 0  . omeprazole (PRILOSEC) 40 MG capsule Take 1 capsule (40 mg total) by mouth daily. 30 capsule 11  . potassium chloride (K-DUR) 10 MEQ tablet Take 1 tablet (10 mEq total) by mouth daily. 90 tablet 3  . PREMARIN vaginal cream INSERT 0.5 GRAMS  INTRAVAGINALLY TWICE WEEKLY, MONDAY AND THURSDAY 30 g 0  . topiramate (TOPAMAX) 25 MG capsule Take 75 mg by mouth 2 (two) times daily.     No facility-administered medications prior to visit.     ROS Review of Systems  Constitutional: Positive for chills and fatigue. Negative for fever.  HENT: Positive for congestion.   Eyes: Negative for visual disturbance.  Respiratory: Positive for cough. Negative for shortness of breath.   Cardiovascular: Negative for chest pain.  Gastrointestinal: Negative for abdominal pain and blood in stool.  Musculoskeletal: Positive for back pain and neck pain. Negative for arthralgias.  Skin: Negative for rash.  Allergic/Immunologic: Negative for immunocompromised state.  Neurological: Negative for dizziness and headaches.  Hematological: Negative for adenopathy. Does not bruise/bleed easily.  Psychiatric/Behavioral: Negative for dysphoric mood and suicidal ideas.    Objective:  BP 115/68 (BP Location: Left Arm, Patient Position: Sitting, Cuff Size: Small)   Pulse 82   Temp 97.8 F (36.6 C) (Oral)   Ht 5\' 4"  (1.626 m)   Wt 218 lb 3.2 oz (99 kg)   SpO2 94%   BMI 37.45 kg/m   BP/Weight 09/10/2016 6/96/2952 02/07/1323  Systolic BP 401 027 253  Diastolic BP 68 50 78  Wt. (Lbs) 218.2 216 216.2  BMI 37.45 37.08 38.3   Physical [name]  Constitutional: She is oriented to person, place, and time. She appears well-developed and well-nourished. No distress.  Obese   HENT:  Head: Normocephalic and atraumatic.  Cardiovascular: Normal rate, regular rhythm, normal heart sounds and intact distal pulses.   Pulmonary/Chest: Effort normal and breath sounds normal.  Musculoskeletal: She exhibits no edema.  Neurological: She is alert and oriented to person, place, and time.  Skin: Skin is warm and dry. No rash noted.  Psychiatric: She has a normal mood and affect.   Lab Results  Component Value Date   HGBA1C 9.7 08/11/2016    CBG 248   Assessment & Plan:    Heidi [name] was seen today for hypertension and diabetes.  Diagnoses and all orders for this visit:  Uncontrolled type 2 diabetes mellitus with stage 2 chronic kidney disease, with long-term current use of insulin (HCC) -     POCT glucose (manual entry) -     insulin lispro protamine-lispro (HUMALOG 75/25 MIX) (75-25) 100 UNIT/ML SUSP injection; Inject subcutaneously 30 U in the morning with food and 40 U at night  Essential hypertension  Chronic neck pain -     acetaminophen-codeine (TYLENOL/CODEINE #4) 300-60 MG tablet; Take 1 tablet by mouth every 8 (eight) hours as needed for pain.    No orders of the defined types were placed in this encounter.   Follow-up: Return in about 2 months (around 11/10/2016) for HTN  and diabetes .   Boykin Nearing MD

## 2016-09-10 NOTE — Assessment & Plan Note (Signed)
Chronic pain, previously on Vicodin  Tylenol #4 prescribed  Patient to establish with pain management locally

## 2016-09-11 ENCOUNTER — Ambulatory Visit: Payer: Medicare Other | Admitting: Family Medicine

## 2016-09-16 DIAGNOSIS — M25561 Pain in right knee: Secondary | ICD-10-CM | POA: Diagnosis not present

## 2016-09-16 DIAGNOSIS — M25562 Pain in left knee: Secondary | ICD-10-CM | POA: Diagnosis not present

## 2016-09-16 DIAGNOSIS — M170 Bilateral primary osteoarthritis of knee: Secondary | ICD-10-CM | POA: Diagnosis not present

## 2016-09-24 DIAGNOSIS — M25561 Pain in right knee: Secondary | ICD-10-CM | POA: Diagnosis not present

## 2016-09-24 DIAGNOSIS — M1711 Unilateral primary osteoarthritis, right knee: Secondary | ICD-10-CM | POA: Diagnosis not present

## 2016-09-28 ENCOUNTER — Other Ambulatory Visit: Payer: Self-pay | Admitting: Family Medicine

## 2016-09-28 DIAGNOSIS — Z1231 Encounter for screening mammogram for malignant neoplasm of breast: Secondary | ICD-10-CM

## 2016-09-29 DIAGNOSIS — M1712 Unilateral primary osteoarthritis, left knee: Secondary | ICD-10-CM | POA: Diagnosis not present

## 2016-09-29 DIAGNOSIS — M25562 Pain in left knee: Secondary | ICD-10-CM | POA: Diagnosis not present

## 2016-10-01 DIAGNOSIS — M25561 Pain in right knee: Secondary | ICD-10-CM | POA: Diagnosis not present

## 2016-10-01 DIAGNOSIS — M1711 Unilateral primary osteoarthritis, right knee: Secondary | ICD-10-CM | POA: Diagnosis not present

## 2016-10-06 ENCOUNTER — Other Ambulatory Visit: Payer: Self-pay | Admitting: Family Medicine

## 2016-10-08 ENCOUNTER — Other Ambulatory Visit: Payer: Self-pay | Admitting: Pharmacist

## 2016-10-08 MED ORDER — GLUCOSE BLOOD VI STRP: ORAL_STRIP | 5 refills | Status: DC

## 2016-10-13 ENCOUNTER — Other Ambulatory Visit: Payer: Self-pay | Admitting: Pharmacist

## 2016-10-13 MED ORDER — ESTROGENS, CONJUGATED 0.625 MG/GM VA CREA: TOPICAL_CREAM | VAGINAL | 0 refills | Status: DC

## 2016-10-15 ENCOUNTER — Ambulatory Visit
Admission: RE | Admit: 2016-10-15 | Discharge: 2016-10-15 | Disposition: A | Discharge status: Home / Self Care | Payer: Medicare Other | Source: Ambulatory Visit | Attending: Family Medicine | Admitting: Family Medicine

## 2016-10-15 DIAGNOSIS — Z1231 Encounter for screening mammogram for malignant neoplasm of breast: Secondary | ICD-10-CM | POA: Diagnosis not present

## 2016-10-22 DIAGNOSIS — H401112 Primary open-angle glaucoma, right eye, moderate stage: Secondary | ICD-10-CM | POA: Diagnosis not present

## 2016-10-22 DIAGNOSIS — E119 Type 2 diabetes mellitus without complications: Secondary | ICD-10-CM | POA: Diagnosis not present

## 2016-10-22 DIAGNOSIS — H401123 Primary open-angle glaucoma, left eye, severe stage: Secondary | ICD-10-CM | POA: Diagnosis not present

## 2016-10-27 ENCOUNTER — Other Ambulatory Visit: Payer: Self-pay | Admitting: Family Medicine

## 2016-10-27 DIAGNOSIS — N182 Chronic kidney disease, stage 2 (mild): Principal | ICD-10-CM

## 2016-10-27 DIAGNOSIS — E1122 Type 2 diabetes mellitus with diabetic chronic kidney disease: Secondary | ICD-10-CM

## 2016-10-28 ENCOUNTER — Ambulatory Visit (AMBULATORY_SURGERY_CENTER): Payer: Medicare Other | Admitting: Internal Medicine

## 2016-10-28 ENCOUNTER — Encounter: Payer: Self-pay | Admitting: Internal Medicine

## 2016-10-28 VITALS — BP 110/65 | HR 82 | Temp 96.0°F | Resp 14 | Ht 64.0 in | Wt 216.0 lb

## 2016-10-28 DIAGNOSIS — D123 Benign neoplasm of transverse colon: Secondary | ICD-10-CM

## 2016-10-28 DIAGNOSIS — Z800 Family history of malignant neoplasm of digestive organs: Secondary | ICD-10-CM | POA: Diagnosis not present

## 2016-10-28 DIAGNOSIS — Z86010 Personal history of colonic polyps: Secondary | ICD-10-CM

## 2016-10-28 DIAGNOSIS — R194 Change in bowel habit: Secondary | ICD-10-CM | POA: Diagnosis not present

## 2016-10-28 DIAGNOSIS — D120 Benign neoplasm of cecum: Secondary | ICD-10-CM | POA: Diagnosis not present

## 2016-10-28 MED ORDER — SODIUM CHLORIDE 0.9 % IV SOLN: 500.0000 mL | INTRAVENOUS | Status: DC

## 2016-10-28 NOTE — Op Note (Signed)
[place] Patient Name: Heidi [name] Procedure Date: 10/28/2016 11:20 AM MRN: [id number] Endoscopist: Docia Chuck. Henrene Pastor , MD Age: 65 Referring MD:  Date of Birth: [birth year] Gender: Female Account #: [id number] Procedure:                Colonoscopy with cold snare x 3 Indications:              High risk colon cancer surveillance: Personal                            history of non-advanced adenoma ([place]ew[place] 2015).                            Fam Hx Parent 20; sibling Medicines:                Monitored Anesthesia Care Procedure:                Pre-Anesthesia Assessment:                           - Prior to the procedure, a History and Physical                            was performed, and patient medications and                            allergies were reviewed. The patient's tolerance of                            previous anesthesia was also reviewed. The risks                            and benefits of the procedure and the sedation                            options and risks were discussed with the patient.                            All questions were answered, and informed consent                            was obtained. Prior Anticoagulants: The patient has                            taken no previous anticoagulant or antiplatelet                            agents. ASA Grade Assessment: II - A patient with                            mild systemic disease. After reviewing the risks                            and benefits, the patient was deemed in  satisfactory condition to undergo the procedure.                           After obtaining informed consent, the colonoscope                            was passed under direct vision. Throughout the                            procedure, the patient's blood pressure, pulse, and                            oxygen saturations were monitored continuously. The                            Colonoscope was  introduced through the anus and                            advanced to the the cecum, identified by                            appendiceal orifice and ileocecal valve. The                            ileocecal valve, appendiceal orifice, and rectum                            were photographed. The quality of the bowel                            preparation was excellent. The colonoscopy was                            performed without difficulty. The patient tolerated                            the procedure well. The bowel preparation used was                            SUPREP. Scope In: 11:32:22 AM Scope Out: 11:47:57 AM Scope Withdrawal Time: 0 hours 13 minutes 27 seconds  Total Procedure Duration: 0 hours 15 minutes 35 seconds  Findings:                 Three polyps were found in the transverse colon and                            cecum. The polyps were 2 to 3 mm in size. These                            polyps were removed with a cold snare. Resection                            and retrieval were complete.  Internal hemorrhoids were found during retroflexion.                           The exam was otherwise without abnormality on                            direct and retroflexion views. Complications:            No immediate complications. Estimated blood loss:                            None. Estimated Blood Loss:     Estimated blood loss: none. Impression:               - Three 2 to 3 mm polyps in the transverse colon                            and in the cecum, removed with a cold snare.                            Resected and retrieved.                           - Internal hemorrhoids.                           - The examination was otherwise normal on direct                            and retroflexion views. Recommendation:           - Repeat colonoscopy in 3 - 5 years for                            surveillance.                           - Patient has  a contact number available for                            emergencies. The signs and symptoms of potential                            delayed complications were discussed with the                            patient. Return to normal activities tomorrow.                            Written discharge instructions were provided to the                            patient.                           - Resume previous diet.                           -  Continue present medications.                           - Await pathology results. Docia Chuck. Henrene Pastor, MD 10/28/2016 11:52:46 AM This report has been signed electronically.

## 2016-10-28 NOTE — Progress Notes (Signed)
Called to room to assist during endoscopic procedure.  Patient ID and intended procedure confirmed with present staff. Received instructions for my participation in the procedure from the performing physician.  

## 2016-10-28 NOTE — Patient Instructions (Signed)
Handout given: Polyps and Hemorrhoids.   YOU HAD AN ENDOSCOPIC PROCEDURE TODAY AT THE [facility]:   Refer to the procedure report that was given to you for any specific questions about what was found during the examination.  If the procedure report does not answer your questions, please call your gastroenterologist to clarify.  If you requested that your care partner not be given the details of your procedure findings, then the procedure report has been included in a sealed envelope for you to review at your convenience later.  YOU SHOULD EXPECT: Some feelings of bloating in the abdomen. Passage of more gas than usual.  Walking can help get rid of the air that was put into your GI tract during the procedure and reduce the bloating. If you had a lower endoscopy (such as a colonoscopy or flexible sigmoidoscopy) you may notice spotting of blood in your stool or on the toilet paper. If you underwent a bowel prep for your procedure, you may not have a normal bowel movement for a few days.  Please Note:  You might notice some irritation and congestion in your nose or some drainage.  This is from the oxygen used during your procedure.  There is no need for concern and it should clear up in a day or so.  SYMPTOMS TO REPORT IMMEDIATELY:   Following lower endoscopy (colonoscopy or flexible sigmoidoscopy):  Excessive amounts of blood in the stool  Significant tenderness or worsening of abdominal pains  Swelling of the abdomen that is new, acute  Fever of 100F or higher   For urgent or emergent issues, a gastroenterologist can be reached at any hour by calling [phone].   DIET:  We do recommend a small meal at first, but then you may proceed to your regular diet.  Drink plenty of fluids but you should avoid alcoholic beverages for 24 hours.  ACTIVITY:  You should plan to take it easy for the rest of today and you should NOT DRIVE or use heavy machinery until tomorrow (because of the  sedation medicines used during the test).    FOLLOW UP: Our staff will call the number listed on your records the next business day following your procedure to check on you and address any questions or concerns that you may have regarding the information given to you following your procedure. If we do not reach you, we will leave a message.  However, if you are feeling well and you are not experiencing any problems, there is no need to return our call.  We will assume that you have returned to your regular daily activities without incident.  If any biopsies were taken you will be contacted by phone or by letter within the next 1-3 weeks.  Please call us at [phone] if you have not heard about the biopsies in 3 weeks.    SIGNATURES/CONFIDENTIALITY: You and/or your care partner have signed paperwork which will be entered into your electronic medical record.  These signatures attest to the fact that that the information above on your After Visit Summary has been reviewed and is understood.  Full responsibility of the confidentiality of this discharge information lies with you and/or your care-partner. 

## 2016-10-28 NOTE — Progress Notes (Signed)
A/ox3 pleased with MAC, report to Karen RN 

## 2016-10-28 NOTE — Progress Notes (Signed)
Pt's states no medical or surgical changes since previsit or office visit. 

## 2016-10-29 ENCOUNTER — Telehealth: Payer: Self-pay | Admitting: Neurology

## 2016-10-29 ENCOUNTER — Telehealth: Payer: Self-pay | Admitting: *Deleted

## 2016-10-29 NOTE — Telephone Encounter (Signed)
Left message on f/u call 

## 2016-10-29 NOTE — Telephone Encounter (Signed)
error 

## 2016-10-29 NOTE — Telephone Encounter (Signed)
  Follow up Call-  Call back number 10/28/2016  Post procedure Call Back phone  # [phone]  Permission to leave phone message Yes     Patient questions:  Do you have a fever, pain , or abdominal swelling? No. Pain Score  0 *  Have you tolerated food without any problems? Yes.    Have you been able to return to your normal activities? Yes.    Do you have any questions about your discharge instructions: Diet   No. Medications  No. Follow up visit  No.  Do you have questions or concerns about your Care? No.  Actions: * If pain score is 4 or above: No action needed, pain <4.

## 2016-10-29 NOTE — Telephone Encounter (Signed)
Pt called to schedule botox appt, last appt 07/14/16. She is wanting to know why she has not been called to schedule an appt. I advised her Andee Poles would call next week, she is out of the office until. The patient then asked to leave a msg for the Dr asking if appt can be pushed up. I advised her Andee Poles handles all of those phone calls but I would send a msg to Rn also.

## 2016-10-30 NOTE — Telephone Encounter (Signed)
Thanks Delsa Sale, forwarding to [place].

## 2016-11-03 ENCOUNTER — Encounter: Payer: Self-pay | Admitting: Internal Medicine

## 2016-11-05 ENCOUNTER — Other Ambulatory Visit: Payer: Self-pay | Admitting: Family Medicine

## 2016-11-05 DIAGNOSIS — M25562 Pain in left knee: Secondary | ICD-10-CM | POA: Diagnosis not present

## 2016-11-05 DIAGNOSIS — IMO0002 Reserved for concepts with insufficient information to code with codable children: Secondary | ICD-10-CM

## 2016-11-05 DIAGNOSIS — K219 Gastro-esophageal reflux disease without esophagitis: Secondary | ICD-10-CM

## 2016-11-05 DIAGNOSIS — E1165 Type 2 diabetes mellitus with hyperglycemia: Secondary | ICD-10-CM

## 2016-11-05 DIAGNOSIS — M1712 Unilateral primary osteoarthritis, left knee: Secondary | ICD-10-CM | POA: Diagnosis not present

## 2016-11-10 ENCOUNTER — Ambulatory Visit: Payer: Medicare Other | Admitting: Family Medicine

## 2016-11-10 DIAGNOSIS — M25561 Pain in right knee: Secondary | ICD-10-CM | POA: Diagnosis not present

## 2016-11-10 DIAGNOSIS — M1711 Unilateral primary osteoarthritis, right knee: Secondary | ICD-10-CM | POA: Diagnosis not present

## 2016-11-11 ENCOUNTER — Other Ambulatory Visit: Payer: Self-pay | Admitting: Family Medicine

## 2016-11-11 DIAGNOSIS — I5033 Acute on chronic diastolic (congestive) heart failure: Secondary | ICD-10-CM

## 2016-11-11 DIAGNOSIS — I10 Essential (primary) hypertension: Secondary | ICD-10-CM

## 2016-11-16 ENCOUNTER — Ambulatory Visit (INDEPENDENT_AMBULATORY_CARE_PROVIDER_SITE_OTHER): Payer: Medicare Other | Admitting: Neurology

## 2016-11-16 ENCOUNTER — Telehealth: Payer: Self-pay | Admitting: Family Medicine

## 2016-11-16 DIAGNOSIS — E1122 Type 2 diabetes mellitus with diabetic chronic kidney disease: Secondary | ICD-10-CM

## 2016-11-16 DIAGNOSIS — G43709 Chronic migraine without aura, not intractable, without status migrainosus: Secondary | ICD-10-CM | POA: Diagnosis not present

## 2016-11-16 DIAGNOSIS — N182 Chronic kidney disease, stage 2 (mild): Principal | ICD-10-CM

## 2016-11-16 DIAGNOSIS — Z794 Long term (current) use of insulin: Principal | ICD-10-CM

## 2016-11-16 DIAGNOSIS — IMO0002 Reserved for concepts with insufficient information to code with codable children: Secondary | ICD-10-CM

## 2016-11-16 DIAGNOSIS — E1165 Type 2 diabetes mellitus with hyperglycemia: Principal | ICD-10-CM

## 2016-11-16 MED ORDER — INSULIN LISPRO PROT & LISPRO (75-25 MIX) 100 UNIT/ML ~~LOC~~ SUSP: SUBCUTANEOUS | 5 refills | Status: DC

## 2016-11-16 NOTE — Telephone Encounter (Signed)
Insulin was refilled on 09/10/16 with 5 refills. I just sent it in again.  Please f/u with pharmacy is patient requesting refill too soon? This has happened in the past because she self adjusted the dose.

## 2016-11-16 NOTE — Telephone Encounter (Signed)
Pt is calling in to request a refill of her insulin. States that she is completely out. Please f/u. Thank you.

## 2016-11-16 NOTE — Progress Notes (Signed)

## 2016-11-16 NOTE — Telephone Encounter (Signed)
PT called to request more Insulin since she her self increase the dose with out the PCP knowing this, I told her that she need an office visit to talk to the PCP

## 2016-11-16 NOTE — Progress Notes (Signed)
Botox 100 units/vial x 2 vials from floor stock (B/B) NDC 6394-3200-37 Lot [id number] Exp 12 2020  Diluted in 4 ml of Bacteriostatic 0.9% NaCl NDC 1222-4114-64 Lot [id number] Exp [id number]  Topical pain reliever applied to injection areas w/ gauze Lidocaine 5% cream NDC 11003-496-11 Lot [id number] Exp 06/19

## 2016-11-17 ENCOUNTER — Other Ambulatory Visit: Payer: Self-pay

## 2016-11-17 DIAGNOSIS — IMO0002 Reserved for concepts with insufficient information to code with codable children: Secondary | ICD-10-CM

## 2016-11-17 DIAGNOSIS — M1712 Unilateral primary osteoarthritis, left knee: Secondary | ICD-10-CM | POA: Diagnosis not present

## 2016-11-17 DIAGNOSIS — N182 Chronic kidney disease, stage 2 (mild): Principal | ICD-10-CM

## 2016-11-17 DIAGNOSIS — E1122 Type 2 diabetes mellitus with diabetic chronic kidney disease: Secondary | ICD-10-CM

## 2016-11-17 DIAGNOSIS — Z794 Long term (current) use of insulin: Principal | ICD-10-CM

## 2016-11-17 DIAGNOSIS — E1165 Type 2 diabetes mellitus with hyperglycemia: Principal | ICD-10-CM

## 2016-11-17 DIAGNOSIS — M25562 Pain in left knee: Secondary | ICD-10-CM | POA: Diagnosis not present

## 2016-11-17 MED ORDER — INSULIN LISPRO PROT & LISPRO (75-25 MIX) 100 UNIT/ML ~~LOC~~ SUSP: SUBCUTANEOUS | 5 refills | Status: DC

## 2016-11-17 NOTE — Telephone Encounter (Signed)
Pt states that she has been adjusting her doses of insulin. Pt states that pharmacy needs a new script in order for her to fill her insulin.

## 2016-11-17 NOTE — Telephone Encounter (Signed)
Please ask patient to provide humalog 75/25 doses  As long as she is taking twice daily and not getting low sugars (CBG < 70) please re-order as stated by patient

## 2016-11-17 NOTE — Telephone Encounter (Signed)
Pt insulin has been refilled and sent to her pharmacy.

## 2016-11-18 ENCOUNTER — Encounter: Payer: Self-pay | Admitting: Family Medicine

## 2016-11-19 ENCOUNTER — Telehealth: Payer: Self-pay | Admitting: *Deleted

## 2016-11-19 NOTE — Telephone Encounter (Signed)
Agree with assessment and scheduled follow up

## 2016-11-19 NOTE — Telephone Encounter (Addendum)
Reason for walkin- Wound on left foot and ankle, itching  Noticed x 3 days. Others sx's: chills, pain, bumps, soreness.  Skin is clean, dry, and intact. No open wounds or ulcer to left lateral lower extremity. Redness and warm to touch.  Swelling BLE. Non pitting.  Pt advised to f/u with provider for further evaluation. Elevated bliateral lower extrmities. Also advised to wear articles of clothing that would not cause irritation to area by continuous rubbing or decreased circulation.   Appt scheduled for Friday, May 18th @ 0930.  VS:  T:98.2  P: 77  R: 20  BP: 137/83  SpO2: 97%

## 2016-11-20 ENCOUNTER — Encounter: Payer: Self-pay | Admitting: Family Medicine

## 2016-11-20 ENCOUNTER — Ambulatory Visit (HOSPITAL_BASED_OUTPATIENT_CLINIC_OR_DEPARTMENT_OTHER): Payer: Medicare Other | Admitting: Physical Medicine & Rehabilitation

## 2016-11-20 ENCOUNTER — Encounter: Payer: Medicare Other | Attending: Physical Medicine & Rehabilitation

## 2016-11-20 ENCOUNTER — Encounter: Payer: Self-pay | Admitting: Physical Medicine & Rehabilitation

## 2016-11-20 ENCOUNTER — Ambulatory Visit: Payer: Medicare Other | Attending: Family Medicine | Admitting: Family Medicine

## 2016-11-20 VITALS — BP 111/69 | HR 79 | Temp 97.7°F | Wt 219.6 lb

## 2016-11-20 VITALS — BP 112/71 | HR 74

## 2016-11-20 DIAGNOSIS — E119 Type 2 diabetes mellitus without complications: Secondary | ICD-10-CM | POA: Insufficient documentation

## 2016-11-20 DIAGNOSIS — S01312S Laceration without foreign body of left ear, sequela: Secondary | ICD-10-CM

## 2016-11-20 DIAGNOSIS — M4802 Spinal stenosis, cervical region: Secondary | ICD-10-CM | POA: Insufficient documentation

## 2016-11-20 DIAGNOSIS — I5033 Acute on chronic diastolic (congestive) heart failure: Secondary | ICD-10-CM

## 2016-11-20 DIAGNOSIS — M47812 Spondylosis without myelopathy or radiculopathy, cervical region: Secondary | ICD-10-CM | POA: Diagnosis not present

## 2016-11-20 DIAGNOSIS — R21 Rash and other nonspecific skin eruption: Secondary | ICD-10-CM

## 2016-11-20 DIAGNOSIS — G8929 Other chronic pain: Secondary | ICD-10-CM | POA: Insufficient documentation

## 2016-11-20 DIAGNOSIS — Z794 Long term (current) use of insulin: Secondary | ICD-10-CM

## 2016-11-20 DIAGNOSIS — E1165 Type 2 diabetes mellitus with hyperglycemia: Secondary | ICD-10-CM | POA: Diagnosis not present

## 2016-11-20 DIAGNOSIS — E7800 Pure hypercholesterolemia, unspecified: Secondary | ICD-10-CM | POA: Diagnosis not present

## 2016-11-20 DIAGNOSIS — Z23 Encounter for immunization: Secondary | ICD-10-CM | POA: Diagnosis not present

## 2016-11-20 DIAGNOSIS — I110 Hypertensive heart disease with heart failure: Secondary | ICD-10-CM | POA: Insufficient documentation

## 2016-11-20 DIAGNOSIS — IMO0002 Reserved for concepts with insufficient information to code with codable children: Secondary | ICD-10-CM

## 2016-11-20 DIAGNOSIS — I509 Heart failure, unspecified: Secondary | ICD-10-CM | POA: Diagnosis not present

## 2016-11-20 DIAGNOSIS — E876 Hypokalemia: Secondary | ICD-10-CM

## 2016-11-20 DIAGNOSIS — I10 Essential (primary) hypertension: Secondary | ICD-10-CM

## 2016-11-20 DIAGNOSIS — E1122 Type 2 diabetes mellitus with diabetic chronic kidney disease: Secondary | ICD-10-CM | POA: Insufficient documentation

## 2016-11-20 DIAGNOSIS — F419 Anxiety disorder, unspecified: Secondary | ICD-10-CM | POA: Diagnosis not present

## 2016-11-20 DIAGNOSIS — N182 Chronic kidney disease, stage 2 (mild): Secondary | ICD-10-CM | POA: Diagnosis not present

## 2016-11-20 DIAGNOSIS — S01312A Laceration without foreign body of left ear, initial encounter: Secondary | ICD-10-CM | POA: Insufficient documentation

## 2016-11-20 LAB — GLUCOSE, POCT (MANUAL RESULT ENTRY): POC Glucose: 95 mg/dl (ref 70–99)

## 2016-11-20 LAB — POCT GLYCOSYLATED HEMOGLOBIN (HGB A1C): Hemoglobin A1C: 11.5

## 2016-11-20 MED ORDER — TRAMADOL HCL 50 MG PO TABS: 50.0000 mg | ORAL_TABLET | Freq: Every day | ORAL | 2 refills | Status: DC | PRN

## 2016-11-20 MED ORDER — MUPIROCIN 2 % EX OINT: 1.0000 "application " | TOPICAL_OINTMENT | Freq: Two times a day (BID) | CUTANEOUS | 0 refills | Status: DC

## 2016-11-20 NOTE — Assessment & Plan Note (Signed)
Resolved on L lateral ankle  Suspect bug bites or allergy given redness with pruritis  Plan: Reassurance Mupirocin ointment prn return of redness

## 2016-11-20 NOTE — Patient Instructions (Addendum)
Diagnoses and all orders for this visit:  Hypokalemia -     Basic Metabolic Panel  Uncontrolled type 2 diabetes mellitus with stage 2 chronic kidney disease, with long-term current use of insulin (HCC) -     POCT glucose (manual entry) -     POCT glycosylated hemoglobin (Hb A1C)  Torn left ear lobe, sequela -     Ambulatory referral to Plastic Surgery  Your A1c is up Diabetes blood sugar goals  Check and write down sugars  Fasting (in AM before breakfast, 8 hrs of no eating or drinking (except water or unsweetened coffee or tea): 90-110 2 hrs after meals: < 160,   No low sugars: nothing < 70   Work on healthy eating and exercise to reach goals   F/u in 6 weeks for diabetes CBG review with clinical pharmacology  F/u in 3 months for diabetes   Dr. Adrian Blackwater

## 2016-11-20 NOTE — Patient Instructions (Signed)
Please let Zella Ball know how the medicine is working, you may on occasion need to take 2 tablets

## 2016-11-20 NOTE — Assessment & Plan Note (Signed)
Plastic surgery referral

## 2016-11-20 NOTE — Assessment & Plan Note (Signed)
A; declined Med: compliant, patient self adjust She fears low sugars and over eats P: DM edu CBG monitoring  Continue novolog 75/25 60 U in AM and 40 U in PM

## 2016-11-20 NOTE — Progress Notes (Signed)
Subjective:    Patient ID: Heidi [name], female    DOB: 08-Sep-1950, 66 y.o.   MRN: [id number]  HPI Chief complaint: Neck pain and upper back pain  Patient notes onset around 2016. I have reviewed notes from [facility] in [place]. Patient states that she had been treated for low back pain, although it was her mid and upper back pain that hurt her worse. Epidural at L4-5, reported as no improvement. MRI of lumbar spine August 2015, interpretation reviewed. trace facet effusions L1 2 disc space narrowing without stenosis centrally, L2-3, left-sided stenosis, foraminal  At L2-3. facet effusions L3-4 neural foraminal narrowing, right greater than left at L3-4. no central stenosis. at L4-5 facet joint hypertrophy. foraminal stenosis, moderate to advanced. L5-S1 moderate. hypertrophy of the facet joints.  Seen by primary care here in [place], placed on Tylenol with Codeine for pain, limited supply while patient was waiting for consultation  Used to be on hydrocodone but made her feel funny. She did not like to take it on a 3 times a day basis which was prescribed by the pain management clinic in [place], [place]. She was concerned that she once developed an infection on her foot and she could not even feel it.  Past medical history is positive for diabetes, which has been poorly controlled. At one point, her hemoglobin A1c was 15    Pain Inventory Average Pain 9 Pain Right Now 9 My pain is burning and aching  In the last 24 hours, has pain interfered with the following? General activity 9 Relation with others 0 Enjoyment of life 0 What TIME of day is your pain at its worst? all Sleep (in general) Fair  Pain is worse with: walking, sitting and standing Pain improves with: rest and medication Relief from Meds: 5  Mobility walk with assistance use a cane use a walker how many minutes can you walk? 5 ability to climb steps?  yes do you drive?   yes  Function retired  Neuro/Psych bladder control problems spasms  Prior Studies new visit EXAM: MRI CERVICAL SPINE WITHOUT CONTRAST   TECHNIQUE: Multiplanar, multisequence MR imaging of the cervical spine was performed. No intravenous contrast was administered.   COMPARISON:  Prior MRI from 11/30/2014.   FINDINGS: Alignment: Stable alignment with straightening of the normal cervical lordosis. The no listhesis.   Vertebrae: The vertebral body heights maintained. Abnormal heterogeneous appearance of the vertebral body bone marrow, similar to previous study. Again, finding is like related to red marrow conversion related to underlying chronic anemia or possibly hypoxemia. Lymphoproliferative disorder could also have this appearance.   Cord: Signal intensity within the cervical spinal cord is normal.   Posterior Fossa, vertebral arteries, paraspinal tissues: Probable chronic microvascular ischemic changes partially visualized within the pons. Partially visualized brain and posterior fossa are otherwise unremarkable. Craniocervical junction within normal limits. Paraspinous and prevertebral soft tissues within normal limits. Major intracranial vascular flow voids maintained within the vertebral arteries.   Disc levels:   C2-C3: Negative.   C3-C4: Mild bilateral uncovertebral hypertrophy with minimal disc bulge. No significant stenosis.   C4-C5: Degenerative intervertebral disc space narrowing with diffuse disc bulge. Mild bilateral uncovertebral hypertrophy. No significant stenosis.   C5-C6: Degenerative intervertebral disc space narrowing with diffuse disc bulge and bilateral uncovertebral spurring. Resultant moderate bilateral foraminal narrowing, right worse than left. Changes are similar to previous. Broad-based posterior disc osteophyte flattens the ventral thecal sac without significant canal stenosis.   C6-C7: Degenerative intervertebral disc space  narrowing with bilateral uncovertebral spurring. Mild bilateral foraminal narrowing. No significant canal stenosis.   C7-T1:  Negative.   The partially visualized upper thoracic spine demonstrates no significant degenerative changes are stenosis.   IMPRESSION: 1. Stable appearance of the cervical spine with multilevel degenerative spondylolysis extending from C4-5 through C6-7, greatest at C5-6 where there is moderate bilateral foraminal stenosis. 2. Abnormal marrow, likely related to red marrow reconversion due to chronic anemia or hypoxemia, similar to previous. Again, lymphoproliferative disorder could also have this appearance.     Electronically Signed   By: Jeannine Boga M.D.   On: 04/10/2016 21:41  Physicians involved in your care new visit   Family History  Problem Relation Age of Onset  . Colon cancer Father   . Diabetes type II Sister   . Diabetes type II Brother   . Colon cancer Sister   . Migraines Neg Hx    Social History   Social History  . Marital status: Legally Separated    Spouse name: N/A  . Number of children: 4  . Years of education: N/A   Occupational History  . Disabled    Social History Main Topics  . Smoking status: Never Smoker  . Smokeless tobacco: Never Used  . Alcohol use No  . Drug use: No  . Sexual activity: Not Asked   Other Topics Concern  . None   Social History Narrative   Lives with daughter   Caffeine use: 1 cup coffee per day   Past Surgical History:  Procedure Laterality Date  . ABDOMINAL HYSTERECTOMY    . CESAREAN SECTION     Past Medical History:  Diagnosis Date  . Adenomatous colon polyp   . Anemia   . Anxiety   . Asthma   . CHF (congestive heart failure) ([place])   . Diabetes mellitus without complication ([place])   . High cholesterol   . Hypertension    BP 112/71 (BP Location: Left Arm, Patient Position: Sitting, Cuff Size: Large)   Pulse 74   SpO2 (!) 89%   Opioid Risk Score:   Fall Risk  Score:  `1   Depression screen PHQ 2/9  Depression screen [facility] 2/9 08/11/2016 05/21/2016 03/26/2016 01/20/2016 11/14/2015 09/20/2015 07/23/2015  Decreased Interest 0 0 1 0 1 0 0  Down, Depressed, Hopeless 0 0 1 0 0 0 0  PHQ - 2 Score 0 0 2 0 1 0 0  Altered sleeping 0 0 0 - 1 0 3  Tired, decreased energy 0 0 1 - 0 0 3  Change in appetite 0 0 0 - 0 1 3  Feeling bad or failure about yourself  0 0 0 - 0 0 1  Trouble concentrating 0 1 0 - 1 2 2   Moving slowly or fidgety/restless 0 0 0 - 1 2 1   Suicidal thoughts 0 0 0 - 0 0 0  PHQ-9 Score 0 1 3 - 4 5 13   Some recent data might be hidden    Review of Systems  Constitutional: Negative.   HENT: Negative.   Eyes: Negative.   Respiratory: Positive for apnea and cough.   Cardiovascular: Negative.   Endocrine: Negative.   Genitourinary: Positive for difficulty urinating.  Musculoskeletal: Positive for neck pain and neck stiffness.       Spasms   Skin: Negative.   Allergic/Immunologic: Negative.   Neurological: Negative.   Hematological: Negative.   Psychiatric/Behavioral: Negative.        Objective:   Physical Exam  Constitutional: She is oriented to person, place, and time. She appears well-developed and well-nourished.  HENT:  Head: Normocephalic and atraumatic.  Eyes: Conjunctivae and EOM are normal. Pupils are equal, round, and reactive to light.  Neck: Normal range of motion.  Musculoskeletal:       Right shoulder: Normal. She exhibits normal range of motion, no bony tenderness, no deformity and no pain.       Left shoulder: Normal. She exhibits normal range of motion, no tenderness, no deformity and no pain.       Cervical back: She exhibits normal range of motion.       Thoracic back: She exhibits decreased range of motion.       Lumbar back: She exhibits decreased range of motion.  There is no tenderness to palpation over the fibromyalgia tender points.  There is no tenderness along the cervical or thoracic or lumbar  paraspinal muscles.  There is mild pain along the lateral masses of the cervical spine  Neurological: She is alert and oriented to person, place, and time. She has normal strength. Gait normal.  Reflex Scores:      Tricep reflexes are 1+ on the right side and 1+ on the left side.      Bicep reflexes are 1+ on the right side and 1+ on the left side.      Brachioradialis reflexes are 1+ on the right side and 1+ on the left side.      Patellar reflexes are 1+ on the right side and 1+ on the left side.      Achilles reflexes are 1+ on the right side and 1+ on the left side. Motor strength is 5/5 bilateral deltoid, bicep, tricep, grip, hip flexor, knee extensor, ankle dorsiflexor. Negative straight leg raising  Psychiatric: She has a normal mood and affect.  Nursing note and vitals reviewed.          Assessment & Plan:  1. Primary complaint of cervicalgia does have evidence of cervical degenerative disc, which is moderate at C5-6 and mild at other levels. No significant stenosis centrally. No clinical findings consistent with myelopathy or radiculopathy.  She states her pain is intermittent and does not wish to be on scheduled pain medications. I think this is reasonable. We'll trial tramadol 50 mg daily as needed. Some days she may need to take 2 other days she may not take any. Physical medicine rehabilitation follow-up in 3 months with nurse practitioner. UDS sent , this is in the event that we move up to schedule. 3. Medications  We discussed physical therapy. She states that she has recently gone and did not have any benefit. I do not think any interventional procedures are needed at this point. We could consider cervical medial branch blocks. If she fails to respond well to medication management

## 2016-11-20 NOTE — Addendum Note (Signed)
Addended by: Gomez Cleverly on: 11/20/2016 10:23 AM   Modules accepted: Orders

## 2016-11-20 NOTE — Progress Notes (Addendum)
Subjective:  Patient ID: Heidi [name], female    DOB: 1951/06/10  Age: 66 y.o. MRN: [id number]  CC: Diabetes and Rash   HPI Heidi [name]  has CHF, insulin dependent DM2, CKD, OSA, asthma, HTN, migraine headaches she presents for    1. L lower leg problem:  redness and warm on left lower leg, foot and ankle x 3 days. Improved.  No open wound or drainage. No known trauma. Reports associated pain. Reports chills. No fever. Treated at home with white and blue alcohol. She also used baclofen.   2. CHRONIC DIABETES  Disease Monitoring  Blood Sugar Ranges:   Fasting 162  Post prandial 248   Polyuria: no   Visual problems: no   Medication Compliance: yes, but she often self adjust fearing low sugars  Medication Side Effects  Hypoglycemia: 60, last 3-4 weeks ago    [place] Exam: done recently   Foot Exam: done today   Diet pattern: eating 3 times per day, high sugar   Exercise: declined   3. Torn L ear lobe: occurred decades ago. Painless. She would like to see a Psychiatric nurse about repair.   Past Surgical History:  Procedure Laterality Date  . ABDOMINAL HYSTERECTOMY    . CESAREAN SECTION     Social History  Substance Use Topics  . Smoking status: Never Smoker  . Smokeless tobacco: Never Used  . Alcohol use No    Outpatient Medications Prior to Visit  Medication Sig Dispense Refill  . acetaminophen-codeine (TYLENOL/CODEINE #4) 300-60 MG tablet Take 1 tablet by mouth every 8 (eight) hours as needed for pain. 30 tablet 0  . albuterol (PROVENTIL HFA;VENTOLIN HFA) 108 (90 BASE) MCG/ACT inhaler Inhale 2 puffs into the lungs every 6 (six) hours as needed for wheezing or shortness of breath. 1 Inhaler 3  . albuterol (PROVENTIL) (2.5 MG/3ML) 0.083% nebulizer solution INHALE CONTENTS OF 1 VIAL VIA NEBULIZER EVERY 6 HOURS AS NEEDED FOR WHEEZING OR SHORTNESS OF BREATH 450 mL 1  . Ascorbic Acid (VITAMIN C PO) Take 1 tablet by mouth daily.    Marland Kitchen aspirin 81 MG  chewable tablet Chew 81 mg by mouth daily.     Marland Kitchen atenolol (TENORMIN) 25 MG tablet Take 1 tablet (25 mg total) by mouth 2 (two) times daily. 60 tablet 3  . atorvastatin (LIPITOR) 40 MG tablet TAKE 1 TABLET BY MOUTH DAILY AT 6 PM 90 tablet 0  . baclofen (LIORESAL) 10 MG tablet Take by mouth.    . bumetanide (BUMEX) 0.5 MG tablet Take 1 tablet (0.5 mg total) by mouth daily. 90 tablet 3  . calcium-vitamin D (OSCAL WITH D) 500-200 MG-UNIT tablet Take 1 tablet by mouth daily. 30 tablet 11  . conjugated estrogens (PREMARIN) vaginal cream INSERT 0.5 GRAMS INTRAVAGINALLY TWICE WEEKLY, MONDAY AND THURSDAY 30 g 0  . dextromethorphan-guaiFENesin (MUCINEX DM) 30-600 MG 12hr tablet Take 1 tablet by mouth as needed for cough.    Marland Kitchen glucose blood (FREESTYLE TEST STRIPS) test strip Use as instructed for 3 times daily testing of blood glucose. 100 each 5  . insulin lispro protamine-lispro (HUMALOG 75/25 MIX) (75-25) 100 UNIT/ML SUSP injection Inject subcutaneously 60 U in the morning with food and 40 U at night 30 mL 5  . INSULIN SYRINGE 1CC/29G 29G X 1/2" 1 ML MISC USE THREE TIMES DAILY AS DIRECTED 100 each 5  . Lancets (FREESTYLE) lancets 1 each by Other route 3 (three) times daily. Use as instructed 100 each  12  . Lancets (FREESTYLE) lancets USE AS DIRECTED 100 each 5  . lisinopril (PRINIVIL,ZESTRIL) 5 MG tablet Take 1 tablet (5 mg total) by mouth daily. 90 tablet 3  . montelukast (SINGULAIR) 10 MG tablet TAKE 1 TABLET BY MOUTH EVERY DAY 90 tablet 0  . Multiple Vitamin (MULTIVITAMIN WITH MINERALS) TABS tablet Take 1 tablet by mouth daily.    . nitroGLYCERIN (NITROSTAT) 0.3 MG SL tablet Place 1 tablet (0.3 mg total) under the tongue every 5 (five) minutes as needed for chest pain. Max 3 tablets in 15 minutes 90 tablet 0  . omeprazole (PRILOSEC) 40 MG capsule TAKE 1 CAPSULE(40 MG) BY MOUTH DAILY 30 capsule 2  . potassium chloride (K-DUR) 10 MEQ tablet TAKE 1 TABLET BY MOUTH DAILY 90 tablet 0  . topiramate (TOPAMAX)  25 MG capsule Take 75 mg by mouth 2 (two) times daily.     Facility-Administered Medications Prior to Visit  Medication Dose Route Frequency Provider Last Rate Last Dose  . 0.9 %  sodium chloride infusion  500 mL Intravenous Continuous Irene Shipper, MD        ROS Review of Systems  Constitutional: Positive for chills. Negative for fatigue and fever.  HENT: Negative for congestion.   Eyes: Negative for visual disturbance.  Respiratory: Negative for cough and shortness of breath.   Cardiovascular: Negative for chest pain.  Gastrointestinal: Negative for abdominal pain and blood in stool.  Musculoskeletal: Positive for arthralgias, back pain and neck pain.  Skin: Negative for rash.  Allergic/Immunologic: Negative for immunocompromised state.  Neurological: Negative for dizziness and headaches.  Hematological: Negative for adenopathy. Does not bruise/bleed easily.  Psychiatric/Behavioral: Negative for dysphoric mood and suicidal ideas.    Objective:  BP 111/69   Pulse 79   Temp 97.7 F (36.5 C) (Oral)   Wt 219 lb 9.6 oz (99.6 kg)   SpO2 93%   BMI 37.69 kg/m   BP/Weight 11/20/2016 8/52/7782 10/06/3534  Systolic BP 144 315 400  Diastolic BP 69 65 68  Wt. (Lbs) 219.6 216 218.2  BMI 37.69 37.08 37.45    Physical Exam  Constitutional: She is oriented to person, place, and time. She appears well-developed and well-nourished. No distress.  HENT:  Head: Normocephalic and atraumatic.  Cardiovascular: Normal rate, regular rhythm, normal heart sounds and intact distal pulses.   Pulmonary/Chest: Effort normal and breath sounds normal.  Musculoskeletal: She exhibits no edema.       Left ankle: She exhibits decreased range of motion and swelling. She exhibits no ecchymosis, no deformity, no laceration and normal pulse. No tenderness. Achilles tendon normal.       Feet:  Neurological: She is alert and oriented to person, place, and time.  Skin: Skin is warm and dry. No rash noted.    Psychiatric: She has a normal mood and affect.    Lab Results  Component Value Date   HGBA1C 9.7 08/11/2016   CBG 248 Assessment & Plan:  Diagnoses and all orders for this visit:  Hypokalemia -     Basic Metabolic Panel  Uncontrolled type 2 diabetes mellitus with stage 2 chronic kidney disease, with long-term current use of insulin (HCC) -     POCT glucose (manual entry) -     POCT glycosylated hemoglobin (Hb A1C) -     Amb Referral to Nutrition and Diabetic E  Torn left ear lobe, sequela -     Ambulatory referral to Plastic Surgery  Rash -     mupirocin  ointment (BACTROBAN) 2 %; Apply 1 application topically 2 (two) times daily. To L ankle rash if it comes back   There are no diagnoses linked to this encounter.  No orders of the defined types were placed in this encounter.   Follow-up: Return in about 6 weeks (around 01/01/2017).   Boykin Nearing MD

## 2016-11-21 LAB — BASIC METABOLIC PANEL
BUN/Creatinine Ratio: 15 (ref 12–28)
BUN: 21 mg/dL (ref 8–27)
CO2: 24 mmol/L (ref 18–29)
CREATININE: 1.38 mg/dL — AB (ref 0.57–1.00)
Calcium: 10 mg/dL (ref 8.7–10.3)
Chloride: 104 mmol/L (ref 96–106)
GFR calc non Af Amer: 40 mL/min/{1.73_m2} — ABNORMAL LOW (ref >59)
GFR, EST AFRICAN AMERICAN: 47 mL/min/{1.73_m2} — AB (ref >59)
Glucose: 67 mg/dL (ref 65–99)
Potassium: 3.6 mmol/L (ref 3.5–5.2)
Sodium: 144 mmol/L (ref 134–144)

## 2016-11-22 MED ORDER — POTASSIUM CHLORIDE ER 20 MEQ PO TBCR: 20.0000 meq | EXTENDED_RELEASE_TABLET | Freq: Every day | ORAL | 5 refills | Status: DC

## 2016-11-22 NOTE — Addendum Note (Signed)
Addended by: Boykin Nearing on: 11/22/2016 08:53 AM   Modules accepted: Orders

## 2016-11-23 ENCOUNTER — Ambulatory Visit: Payer: Medicare Other | Admitting: Neurology

## 2016-11-24 ENCOUNTER — Telehealth: Payer: Self-pay

## 2016-11-24 NOTE — Telephone Encounter (Signed)
Pt was called and informed of lab results and to increase medication.

## 2016-11-26 ENCOUNTER — Telehealth: Payer: Self-pay | Admitting: *Deleted

## 2016-11-26 DIAGNOSIS — M25561 Pain in right knee: Secondary | ICD-10-CM | POA: Diagnosis not present

## 2016-11-26 DIAGNOSIS — M170 Bilateral primary osteoarthritis of knee: Secondary | ICD-10-CM | POA: Diagnosis not present

## 2016-11-26 DIAGNOSIS — M25562 Pain in left knee: Secondary | ICD-10-CM | POA: Diagnosis not present

## 2016-11-26 LAB — TOXASSURE SELECT,+ANTIDEPR,UR

## 2016-11-26 NOTE — Telephone Encounter (Signed)
Urine drug screen for this encounter does not show any prescribed medication.  By [employer] she was prescribed Tylenol #3 30 tabs 09/10/16 by primary so drug being absent would be considered consistent.

## 2016-12-08 DIAGNOSIS — E119 Type 2 diabetes mellitus without complications: Secondary | ICD-10-CM | POA: Diagnosis not present

## 2016-12-08 DIAGNOSIS — H25812 Combined forms of age-related cataract, left eye: Secondary | ICD-10-CM | POA: Diagnosis not present

## 2016-12-08 DIAGNOSIS — H25811 Combined forms of age-related cataract, right eye: Secondary | ICD-10-CM | POA: Diagnosis not present

## 2016-12-10 ENCOUNTER — Other Ambulatory Visit: Payer: Self-pay | Admitting: Family Medicine

## 2016-12-10 DIAGNOSIS — I5033 Acute on chronic diastolic (congestive) heart failure: Secondary | ICD-10-CM

## 2016-12-10 DIAGNOSIS — I10 Essential (primary) hypertension: Secondary | ICD-10-CM

## 2016-12-10 DIAGNOSIS — H25812 Combined forms of age-related cataract, left eye: Secondary | ICD-10-CM | POA: Diagnosis not present

## 2016-12-10 DIAGNOSIS — K219 Gastro-esophageal reflux disease without esophagitis: Secondary | ICD-10-CM

## 2016-12-23 ENCOUNTER — Telehealth: Payer: Self-pay | Admitting: *Deleted

## 2016-12-23 NOTE — Telephone Encounter (Signed)
Agree with recommendations.  

## 2016-12-23 NOTE — Telephone Encounter (Signed)
Patient Triage Assessment Form Todays Date: 12/23/2016 Name: Heidi [name] DOB: January 08, 1951 Reason for walkin: rash When did your symptoms start: 3 weeks ago Please list symptoms: itching, painful bumps Are you having pain: no  Assessment  Pt has track of bumps to left medial aspect of arm and lateral left leg. She states the bumps are spreading.   Vital Signs:  T: 97.6  P:72   R: 16  SpO2: 96   BP: 139/82    Plan Instructed to inform her apartment complex for beg bugs. Would like medication to help with itching.  Recommended to try OTC  Benadryl cream or itch cream for insects bites.  pls advise for other recommendations.

## 2016-12-31 ENCOUNTER — Ambulatory Visit: Payer: Medicare Other | Admitting: Pharmacist

## 2017-01-12 ENCOUNTER — Encounter: Payer: Self-pay | Admitting: Neurology

## 2017-01-12 ENCOUNTER — Ambulatory Visit (INDEPENDENT_AMBULATORY_CARE_PROVIDER_SITE_OTHER): Payer: Medicare Other | Admitting: Neurology

## 2017-01-12 VITALS — BP 107/76 | HR 73 | Ht 64.0 in | Wt 221.4 lb

## 2017-01-12 DIAGNOSIS — G43709 Chronic migraine without aura, not intractable, without status migrainosus: Secondary | ICD-10-CM | POA: Diagnosis not present

## 2017-01-12 MED ORDER — TOPIRAMATE 25 MG PO CPSP: 75.0000 mg | ORAL_CAPSULE | Freq: Two times a day (BID) | ORAL | 11 refills | Status: DC

## 2017-01-12 NOTE — Progress Notes (Signed)
[facility]    Provider:  Dr Jaynee Eagles Referring Provider: Boykin Nearing, MD Primary Care Physician:  Boykin Nearing, MD  CC:  Migraines  HPI:  Heidi [name] is a 66 y.o. female here as a follow up from Dr. Adrian Blackwater for migraines. Patient has chronic migraines on Botox. Also on topiramate, atenolol. Her headache started in the left and spread to the whole scalp with severe pain, pressure and pounding associated light and sound sensitivity which started in July 2015 after a fall. At last appointment December 2017 she was having daily headaches with greater than 8 days a month migrainous. However the Botox significantly helped reduce her headache burdened by 100 hours a month has migraines used to be more frequent almost every day and last longer up to a whole day without only 8 migraines a month. She does not want to try a new medication, discussed Aimovig.  Patient returns today and reports she has no headache free days. She has excruciating headaches, needles in her head all over, like lightning,  It can be so severe she can pass out. Botox, topiramate, atenolol help. Is in pain management. Stopped baclofen.  Review of Systems: Patient complains of symptoms per HPI as well as the following symptoms: numbness. Pertinent negatives and positives per HPI. All others negative.   Social History   Social History  . Marital status: Legally Separated    Spouse name: N/A  . Number of children: 4  . Years of education: N/A   Occupational History  . Disabled    Social History Main Topics  . Smoking status: Never Smoker  . Smokeless tobacco: Never Used  . Alcohol use No  . Drug use: No  . Sexual activity: Not on file   Other Topics Concern  . Not on file   Social History Narrative   Lives with daughter   Caffeine use: 1 cup coffee per day    Family History  Problem Relation Age of Onset  . Colon cancer Father   . Diabetes type II Sister   . Diabetes type II  Brother   . Colon cancer Sister   . Migraines Neg Hx     Past Medical History:  Diagnosis Date  . Adenomatous colon polyp   . Anemia   . Anxiety   . Asthma   . CHF (congestive heart failure) ([place])   . Diabetes mellitus without complication ([place])   . High cholesterol   . Hypertension     Past Surgical History:  Procedure Laterality Date  . ABDOMINAL HYSTERECTOMY    . CATARACT EXTRACTION Left   . CESAREAN SECTION      Current Outpatient Prescriptions  Medication Sig Dispense Refill  . albuterol (PROVENTIL HFA;VENTOLIN HFA) 108 (90 BASE) MCG/ACT inhaler Inhale 2 puffs into the lungs every 6 (six) hours as needed for wheezing or shortness of breath. 1 Inhaler 3  . albuterol (PROVENTIL) (2.5 MG/3ML) 0.083% nebulizer solution INHALE CONTENTS OF 1 VIAL VIA NEBULIZER EVERY 6 HOURS AS NEEDED FOR WHEEZING OR SHORTNESS OF BREATH 450 mL 1  . Ascorbic Acid (VITAMIN C PO) Take 1 tablet by mouth daily.    Marland Kitchen aspirin 81 MG chewable tablet Chew 81 mg by mouth daily.     Marland Kitchen atenolol (TENORMIN) 25 MG tablet TAKE 1 TABLET(25 MG) BY MOUTH TWICE DAILY 180 tablet 0  . atorvastatin (LIPITOR) 40 MG tablet TAKE 1 TABLET BY MOUTH DAILY AT 6 PM 90 tablet 0  . bumetanide (BUMEX) 0.5 MG tablet  Take 1 tablet (0.5 mg total) by mouth daily. 90 tablet 3  . calcium-vitamin D (OSCAL WITH D) 500-200 MG-UNIT tablet Take 1 tablet by mouth daily. 30 tablet 11  . conjugated estrogens (PREMARIN) vaginal cream INSERT 0.5 GRAMS INTRAVAGINALLY TWICE WEEKLY, MONDAY AND THURSDAY 30 g 0  . dextromethorphan-guaiFENesin (MUCINEX DM) 30-600 MG 12hr tablet Take 1 tablet by mouth as needed for cough.    Marland Kitchen glucose blood (FREESTYLE TEST STRIPS) test strip Use as instructed for 3 times daily testing of blood glucose. 100 each 5  . insulin lispro protamine-lispro (HUMALOG 75/25 MIX) (75-25) 100 UNIT/ML SUSP injection Inject subcutaneously 60 U in the morning with food and 40 U at night 30 mL 5  . INSULIN SYRINGE 1CC/29G 29G X 1/2" 1 ML  MISC USE THREE TIMES DAILY AS DIRECTED 100 each 5  . Lancets (FREESTYLE) lancets 1 each by Other route 3 (three) times daily. Use as instructed 100 each 12  . Lancets (FREESTYLE) lancets USE AS DIRECTED 100 each 5  . lisinopril (PRINIVIL,ZESTRIL) 5 MG tablet Take 1 tablet (5 mg total) by mouth daily. 90 tablet 3  . montelukast (SINGULAIR) 10 MG tablet TAKE 1 TABLET BY MOUTH EVERY DAY 90 tablet 0  . Multiple Vitamin (MULTIVITAMIN WITH MINERALS) TABS tablet Take 1 tablet by mouth daily.    . nitroGLYCERIN (NITROSTAT) 0.3 MG SL tablet Place 1 tablet (0.3 mg total) under the tongue every 5 (five) minutes as needed for chest pain. Max 3 tablets in 15 minutes 90 tablet 0  . omeprazole (PRILOSEC) 40 MG capsule TAKE 1 CAPSULE(40 MG) BY MOUTH DAILY 90 capsule 0  . potassium chloride 20 MEQ TBCR Take 20 mEq by mouth daily. 30 tablet 5  . topiramate (TOPAMAX) 25 MG capsule Take 75 mg by mouth 2 (two) times daily.    . traMADol (ULTRAM) 50 MG tablet Take 1 tablet (50 mg total) by mouth daily as needed. 30 tablet 2   Current Facility-Administered Medications  Medication Dose Route Frequency Provider Last Rate Last Dose  . 0.9 %  sodium chloride infusion  500 mL Intravenous Continuous Irene Shipper, MD        Allergies as of 01/12/2017  . (No Known Allergies)    Vitals: BP 107/76 (BP Location: Left Arm)   Pulse 73   Ht 5\' 4"  (1.626 m)   Wt 221 lb 6.4 oz (100.4 kg)   BMI 38.00 kg/m  Last Weight:  Wt Readings from Last 1 Encounters:  01/12/17 221 lb 6.4 oz (100.4 kg)   Last Height:   Ht Readings from Last 1 Encounters:  01/12/17 5\' 4"  (1.626 m)   Physical exam: Exam: Gen: NAD, conversant, well nourised, obese, well groomed                     Eyes: Conjunctivae clear without exudates or hemorrhage  Neuro: Detailed Neurologic Exam  Speech:    Speech is normal; fluent and spontaneous with normal comprehension.  Cognition:    The patient is oriented to person, place, and time;     recent  and remote memory intact;     language fluent;     normal attention, concentration,     fund of knowledge Cranial Nerves:    The pupils are equal, round, and reactive to light.. Extraocular movements are intact. Trigeminal sensation is intact and the muscles of mastication are normal. The face is symmetric. The palate elevates in the midline. Hearing intact. Voice is  normal. Shoulder shrug is normal. The tongue has normal motion without fasciculations.   Coordination:    Normal finger to nose and heel to shin. Normal rapid alternating movements.   Gait:    Heel-toe and tandem gait are normal.   Motor Observation:    No asymmetry, no atrophy, and no involuntary movements noted. Tone:    Normal muscle tone.    Posture:    Posture is normal. normal erect    Strength:    Strength is V/V in the upper and lower limbs.      Sensation: intact to LT      Assessment/Plan:  This is a 66 year old with chronic migraines improved on topiramate, atenolol and Botox migraine protocol however still having daily headaches who would still benefit from further treatment discussed Aimovig but patient declines at this time. Also discussed other medications, Topiramate adjustments and she declines at this time. Will follow clinically and continue current therapy.   Sarina Ill, MD  [facility] [address] [place], [place] [address]  Phone [phone] Fax [phone]  A total of 25 minutes was spent face-to-face with this patient. Over half this time was spent on counseling patient on the chronic migraine diagnosis and different diagnostic and therapeutic options available.

## 2017-01-12 NOTE — Patient Instructions (Addendum)
Remember to drink plenty of fluid, eat healthy meals and do not skip any meals. Try to eat protein with a every meal and eat a healthy snack such as fruit or nuts in between meals. Try to keep a regular sleep-wake schedule and try to exercise daily, particularly in the form of walking, 20-30 minutes a day, if you can.   As far as your medications are concerned, I would like to suggest: Continue current medications  I would like to see you back for botox, sooner if we need to. Please call us with any interim questions, concerns, problems, updates or refill requests.   Our phone number is [phone]. We also have an after hours call service for urgent matters and there is a physician on-call for urgent questions. For any emergencies you know to call 911 or go to the nearest emergency room

## 2017-01-13 DIAGNOSIS — H2511 Age-related nuclear cataract, right eye: Secondary | ICD-10-CM | POA: Diagnosis not present

## 2017-01-13 DIAGNOSIS — H25811 Combined forms of age-related cataract, right eye: Secondary | ICD-10-CM | POA: Diagnosis not present

## 2017-02-09 ENCOUNTER — Other Ambulatory Visit: Payer: Self-pay | Admitting: Family Medicine

## 2017-02-09 DIAGNOSIS — I10 Essential (primary) hypertension: Secondary | ICD-10-CM

## 2017-02-19 ENCOUNTER — Telehealth: Payer: Self-pay | Admitting: Registered Nurse

## 2017-02-19 ENCOUNTER — Encounter: Payer: Self-pay | Admitting: Registered Nurse

## 2017-02-19 ENCOUNTER — Encounter: Payer: Medicare Other | Attending: Physical Medicine & Rehabilitation | Admitting: Registered Nurse

## 2017-02-19 VITALS — BP 120/65 | HR 78

## 2017-02-19 DIAGNOSIS — I509 Heart failure, unspecified: Secondary | ICD-10-CM | POA: Insufficient documentation

## 2017-02-19 DIAGNOSIS — I110 Hypertensive heart disease with heart failure: Secondary | ICD-10-CM | POA: Insufficient documentation

## 2017-02-19 DIAGNOSIS — M4802 Spinal stenosis, cervical region: Secondary | ICD-10-CM | POA: Insufficient documentation

## 2017-02-19 DIAGNOSIS — G894 Chronic pain syndrome: Secondary | ICD-10-CM

## 2017-02-19 DIAGNOSIS — E7800 Pure hypercholesterolemia, unspecified: Secondary | ICD-10-CM | POA: Diagnosis not present

## 2017-02-19 DIAGNOSIS — E119 Type 2 diabetes mellitus without complications: Secondary | ICD-10-CM | POA: Diagnosis not present

## 2017-02-19 DIAGNOSIS — M5442 Lumbago with sciatica, left side: Secondary | ICD-10-CM | POA: Diagnosis not present

## 2017-02-19 DIAGNOSIS — M542 Cervicalgia: Secondary | ICD-10-CM | POA: Diagnosis not present

## 2017-02-19 DIAGNOSIS — M47812 Spondylosis without myelopathy or radiculopathy, cervical region: Secondary | ICD-10-CM | POA: Insufficient documentation

## 2017-02-19 DIAGNOSIS — F419 Anxiety disorder, unspecified: Secondary | ICD-10-CM | POA: Insufficient documentation

## 2017-02-19 DIAGNOSIS — M5441 Lumbago with sciatica, right side: Secondary | ICD-10-CM

## 2017-02-19 DIAGNOSIS — G8929 Other chronic pain: Secondary | ICD-10-CM | POA: Insufficient documentation

## 2017-02-19 MED ORDER — TRAMADOL HCL 50 MG PO TABS: 50.0000 mg | ORAL_TABLET | Freq: Three times a day (TID) | ORAL | 2 refills | Status: DC | PRN

## 2017-02-19 NOTE — Telephone Encounter (Signed)
On 02/19/2017 the  Heidi [name] was reviewed no conflict was seen on the [place] with multiple pre scribers.  If there were any discrepancies this would have been reported to her physician.

## 2017-02-19 NOTE — Progress Notes (Signed)
Subjective:    Patient ID: Heidi [name], female    DOB: 01-May-1951, 66 y.o.   MRN: [id number]  HPI: Ms. Anorah Trias is a 66 year old female who returns for follow up appointment for chronic pain and medication refill. She states her pain is located in her mid back radiating into her upper and lower back. She ratesh her pain 8. Her current exercise regime is walking. Ms. Prentiss asked if she could resume the Hydrocodone, Dr. Letta Pate note was reviewed. Ms. Pacetti stated on her last visit the Hydrocodone made her feel funny, this was re-iterated to her. She has been taking the Tramadol one - two tablets daily as needed. She will be prescribed Tramadol 50 mg Q8 hours as needed,  This was discussed with Dr. Letta Pate and he agrees with plan, she verbalizes understanding.   Last UDS was performed on 11/20/2016, it was consistent.   Pain Inventory Average Pain 8 Pain Right Now 8 My pain is dull  In the last 24 hours, has pain interfered with the following? General activity . Relation with others . Enjoyment of life . What TIME of day is your pain at its worst? . Sleep (in general) Fair  Pain is worse with: bending, standing and some activites Pain improves with: medication Relief from Meds: 4  Mobility ability to climb steps?  yes do you drive?  yes transfers alone  Function not employed: date last employed . I need assistance with the following:  dressing, bathing and household duties  Neuro/Psych weakness spasms anxiety  Prior Studies Any changes since last visit?  no  Physicians involved in your care Any changes since last visit?  no   Family History  Problem Relation Age of Onset  . Colon cancer Father   . Diabetes type II Sister   . Diabetes type II Brother   . Colon cancer Sister   . Migraines Neg Hx    Social History   Social History  . Marital status: Legally Separated    Spouse name: N/A  . Number of children: 4  . Years of education: N/A    Occupational History  . Disabled    Social History Main Topics  . Smoking status: Never Smoker  . Smokeless tobacco: Never Used  . Alcohol use No  . Drug use: No  . Sexual activity: Not on file   Other Topics Concern  . Not on file   Social History Narrative   Lives with daughter   Caffeine use: 1 cup coffee per day   Past Surgical History:  Procedure Laterality Date  . ABDOMINAL HYSTERECTOMY    . CATARACT EXTRACTION Left   . CESAREAN SECTION     Past Medical History:  Diagnosis Date  . Adenomatous colon polyp   . Anemia   . Anxiety   . Asthma   . CHF (congestive heart failure) ([place])   . Diabetes mellitus without complication ([place])   . High cholesterol   . Hypertension    There were no vitals taken for this visit.  Opioid Risk Score:   Fall Risk Score:  `1  Depression screen PHQ 2/9  Depression screen [facility] 2/9 11/20/2016 08/11/2016 05/21/2016 03/26/2016 01/20/2016 11/14/2015 09/20/2015  Decreased Interest 0 0 0 1 0 1 0  Down, Depressed, Hopeless 0 0 0 1 0 0 0  PHQ - 2 Score 0 0 0 2 0 1 0  Altered sleeping 0 0 0 0 - 1 0  Tired, decreased energy 0 0  0 1 - 0 0  Change in appetite 0 0 0 0 - 0 1  Feeling bad or failure about yourself  0 0 0 0 - 0 0  Trouble concentrating 1 0 1 0 - 1 2  Moving slowly or fidgety/restless 1 0 0 0 - 1 2  Suicidal thoughts 0 0 0 0 - 0 0  PHQ-9 Score 2 0 1 3 - 4 5  Some recent data might be hidden     Review of Systems  Constitutional: Positive for unexpected weight change.  HENT: Negative.   Eyes: Negative.   Respiratory: Positive for apnea.   Cardiovascular: Negative.   Gastrointestinal: Negative.   Endocrine: Negative.   Genitourinary: Negative.   Musculoskeletal: Negative.   Skin: Negative.   Allergic/Immunologic: Negative.   Neurological: Negative.   Hematological: Negative.   Psychiatric/Behavioral: Negative.   All other systems reviewed and are negative.      Objective:   Physical Exam  Constitutional: She is  oriented to person, place, and time. She appears well-developed and well-nourished.  HENT:  Head: Normocephalic and atraumatic.  Neck: Normal range of motion. Neck supple.  Cardiovascular: Normal rate and regular rhythm.   Pulmonary/Chest: Effort normal and breath sounds normal.  Musculoskeletal:  Normal Muscle Bulk and Muscle Testing Reveals: Upper Extremities: Full ROM and Muscle Strength 5/5 Thoracic Paraspinal Tenderness: T-3-T-7 Lower Extremities: Full ROM and Muscle Strength 5/5 Arises from Table  Slowly  Narrow Based Gait   Neurological: She is alert and oriented to person, place, and time.  Skin: Skin is warm and dry.  Psychiatric: She has a normal mood and affect.  Nursing note and vitals reviewed.         Assessment & Plan:  1. Cervical Spondylosis without Myelopathy: Cervicalgia: Continue HEP as Tolerated.  2. Chromic Midline Thoracic Pain: Chronic Pain Syndrome Refilled Tramadol 50 mg one tablet every 8 hours as needed #90.   20 minutes of face to face patient care time was spent during this visit. All questions were encouraged and answered.  F/U in 2 months

## 2017-02-26 ENCOUNTER — Ambulatory Visit: Payer: Medicare Other | Attending: Internal Medicine | Admitting: Internal Medicine

## 2017-02-26 ENCOUNTER — Encounter: Payer: Self-pay | Admitting: Internal Medicine

## 2017-02-26 VITALS — BP 116/75 | HR 98 | Temp 98.4°F | Resp 16 | Wt 221.2 lb

## 2017-02-26 DIAGNOSIS — G4733 Obstructive sleep apnea (adult) (pediatric): Secondary | ICD-10-CM | POA: Insufficient documentation

## 2017-02-26 DIAGNOSIS — Z6837 Body mass index (BMI) 37.0-37.9, adult: Secondary | ICD-10-CM | POA: Diagnosis not present

## 2017-02-26 DIAGNOSIS — Z79899 Other long term (current) drug therapy: Secondary | ICD-10-CM | POA: Insufficient documentation

## 2017-02-26 DIAGNOSIS — I5033 Acute on chronic diastolic (congestive) heart failure: Secondary | ICD-10-CM | POA: Diagnosis not present

## 2017-02-26 DIAGNOSIS — N182 Chronic kidney disease, stage 2 (mild): Secondary | ICD-10-CM | POA: Diagnosis not present

## 2017-02-26 DIAGNOSIS — Z7982 Long term (current) use of aspirin: Secondary | ICD-10-CM | POA: Diagnosis not present

## 2017-02-26 DIAGNOSIS — E669 Obesity, unspecified: Secondary | ICD-10-CM | POA: Insufficient documentation

## 2017-02-26 DIAGNOSIS — M170 Bilateral primary osteoarthritis of knee: Secondary | ICD-10-CM | POA: Diagnosis not present

## 2017-02-26 DIAGNOSIS — M5030 Other cervical disc degeneration, unspecified cervical region: Secondary | ICD-10-CM | POA: Insufficient documentation

## 2017-02-26 DIAGNOSIS — K219 Gastro-esophageal reflux disease without esophagitis: Secondary | ICD-10-CM

## 2017-02-26 DIAGNOSIS — E1165 Type 2 diabetes mellitus with hyperglycemia: Secondary | ICD-10-CM | POA: Diagnosis not present

## 2017-02-26 DIAGNOSIS — E785 Hyperlipidemia, unspecified: Secondary | ICD-10-CM | POA: Diagnosis not present

## 2017-02-26 DIAGNOSIS — IMO0002 Reserved for concepts with insufficient information to code with codable children: Secondary | ICD-10-CM

## 2017-02-26 DIAGNOSIS — G43709 Chronic migraine without aura, not intractable, without status migrainosus: Secondary | ICD-10-CM | POA: Diagnosis not present

## 2017-02-26 DIAGNOSIS — I10 Essential (primary) hypertension: Secondary | ICD-10-CM

## 2017-02-26 DIAGNOSIS — I130 Hypertensive heart and chronic kidney disease with heart failure and stage 1 through stage 4 chronic kidney disease, or unspecified chronic kidney disease: Secondary | ICD-10-CM | POA: Diagnosis not present

## 2017-02-26 DIAGNOSIS — E1122 Type 2 diabetes mellitus with diabetic chronic kidney disease: Secondary | ICD-10-CM

## 2017-02-26 DIAGNOSIS — J45909 Unspecified asthma, uncomplicated: Secondary | ICD-10-CM | POA: Diagnosis not present

## 2017-02-26 DIAGNOSIS — Z794 Long term (current) use of insulin: Secondary | ICD-10-CM

## 2017-02-26 LAB — GLUCOSE, POCT (MANUAL RESULT ENTRY): POC Glucose: 202 mg/dl — AB (ref 70–99)

## 2017-02-26 LAB — POCT GLYCOSYLATED HEMOGLOBIN (HGB A1C): Hemoglobin A1C: 10

## 2017-02-26 MED ORDER — ATORVASTATIN CALCIUM 40 MG PO TABS: ORAL_TABLET | ORAL | 0 refills | Status: DC

## 2017-02-26 MED ORDER — BUMETANIDE 0.5 MG PO TABS: 0.5000 mg | ORAL_TABLET | Freq: Every day | ORAL | 3 refills | Status: DC

## 2017-02-26 MED ORDER — MONTELUKAST SODIUM 10 MG PO TABS: 10.0000 mg | ORAL_TABLET | Freq: Every day | ORAL | 1 refills | Status: DC

## 2017-02-26 MED ORDER — ATENOLOL 25 MG PO TABS: 25.0000 mg | ORAL_TABLET | Freq: Every day | ORAL | 6 refills | Status: DC

## 2017-02-26 MED ORDER — OMEPRAZOLE 40 MG PO CPDR: DELAYED_RELEASE_CAPSULE | ORAL | 1 refills | Status: DC

## 2017-02-26 MED ORDER — HYDROCHLOROTHIAZIDE 25 MG PO TABS: 25.0000 mg | ORAL_TABLET | Freq: Every day | ORAL | 3 refills | Status: DC

## 2017-02-26 NOTE — Patient Instructions (Addendum)
Schedule appt with Stacy for 1 month.   Please keep log of blood sugar readings and bring on visit in 1 month.   Hypoglycemia Hypoglycemia is when the sugar (glucose) level in the blood is too low. Symptoms of low blood sugar may include:  Feeling: ? Hungry. ? Worried or nervous (anxious). ? Sweaty and clammy. ? Confused. ? Dizzy. ? Sleepy. ? Sick to your stomach (nauseous).  Having: ? A fast heartbeat. ? A headache. ? A change in your vision. ? Jerky movements that you cannot control (seizure). ? Nightmares. ? Tingling or no feeling (numbness) around the mouth, lips, or tongue.  Having trouble with: ? Talking. ? Paying attention (concentrating). ? Moving (coordination). ? Sleeping.  Shaking.  Passing out (fainting).  Getting upset easily (irritability).  Low blood sugar can happen to people who have diabetes and people who do not have diabetes. Low blood sugar can happen quickly, and it can be an emergency. Treating Low Blood Sugar Low blood sugar is often treated by eating or drinking something sugary right away. If you can think clearly and swallow safely, follow the 15:15 rule:  Take 15 grams of a fast-acting carb (carbohydrate). Some fast-acting carbs are: ? 1 tube of glucose gel. ? 3 sugar tablets (glucose pills). ? 6-8 pieces of hard candy. ? 4 oz (120 mL) of fruit juice. ? 4 oz (120 mL) of regular (not diet) soda.  Check your blood sugar 15 minutes after you take the carb.  If your blood sugar is still at or below 70 mg/dL (3.9 mmol/L), take 15 grams of a carb again.  If your blood sugar does not go above 70 mg/dL (3.9 mmol/L) after 3 tries, get help right away.  After your blood sugar goes back to normal, eat a meal or a snack within 1 hour.  Treating Very Low Blood Sugar If your blood sugar is at or below 54 mg/dL (3 mmol/L), you have very low blood sugar (severe hypoglycemia). This is an emergency. Do not wait to see if the symptoms will go away.  Get medical help right away. Call your local emergency services (911 in the [place] Do not drive yourself to the hospital. If you have very low blood sugar and you cannot eat or drink, you may need a glucagon shot (injection). A family member or friend should learn how to check your blood sugar and how to give you a glucagon shot. Ask your doctor if you need to have a glucagon shot kit at home. Follow these instructions at home: General instructions  Avoid any diets that cause you to not eat enough food. Talk with your doctor before you start any new diet.  Take over-the-counter and prescription medicines only as told by your doctor.  Limit alcohol to no more than 1 drink per day for nonpregnant women and 2 drinks per day for men. One drink equals 12 oz of beer, 5 oz of wine, or 1 oz of hard liquor.  Keep all follow-up visits as told by your doctor. This is important. If You Have Diabetes:   Make sure you know the symptoms of low blood sugar.  Always keep a source of sugar with you, such as: ? Sugar. ? Sugar tablets. ? Glucose gel. ? Fruit juice. ? Regular soda (not diet soda). ? Milk. ? Hard candy. ? Honey.  Take your medicines as told.  Follow your exercise and meal plan. ? Eat on time. Do not skip meals. ? Follow your sick  day plan when you cannot eat or drink normally. Make this plan ahead of time with your doctor.  Check your blood sugar as often as told by your doctor. Always check before and after exercise.  Share your diabetes care plan with: ? Your work or school. ? People you live with.  Check your pee (urine) for ketones: ? When you are sick. ? As told by your doctor.  Carry a card or wear jewelry that says you have diabetes. If You Have Low Blood Sugar From Other Causes:   Check your blood sugar as often as told by your doctor.  Follow instructions from your doctor about what you cannot eat or drink. Contact a doctor if:  You have trouble keeping your  blood sugar in your target range.  You have low blood sugar often. Get help right away if:  You still have symptoms after you eat or drink something sugary.  Your blood sugar is at or below 54 mg/dL (3 mmol/L).  You have jerky movements that you cannot control.  You pass out. These symptoms may be an emergency. Do not wait to see if the symptoms will go away. Get medical help right away. Call your local emergency services (911 in the [place] Do not drive yourself to the hospital. This information is not intended to replace advice given to you by your health care provider. Make sure you discuss any questions you have with your health care provider. Document Released: 09/16/2009 Document Revised: 11/28/2015 Document Reviewed: 07/26/2015 Elsevier Interactive Patient Education  [employer].

## 2017-02-26 NOTE — Progress Notes (Signed)
Patient ID: Heidi [name], female    DOB: 1950-07-27  MRN: [id number]  CC: re-establish and Diabetes   Subjective: Heidi [name] is a 66 y.o. female who presents to est with me as PCP.  Last saw Dr. Adrian Blackwater 11/2016 Her concerns today include:  66 year old female with history of HTN, CHF, migraines (followed by [facility] on Botox inj), obstructive sleep apnea on BiPAP, asthma, GERD, diabetes 2, CKD stage II, OA knees,  DDD of cervical spine (followed by Dr. Letta Pate and on Tramadol) obesity, HL,  1. DM: -checking BS 3-4 x a day. Did not bring logs -a.m range 130-200s (200s when she eats at night due to fear of low BS) -afraid of hypoglycemia since severe episode in 05/2016 when she accidentally took too much insulin. Did not have to go ER but ever since she feels the feeling -On Humalog 75/25 60u/40u.  Low BS usually around 11 a.m in the 40-70s -eating meals on time.  "I have to redo my eating habits."  Was drinking beer and Henasy. She stopped completely 4 mths ago but found she craves sweet more -wants another 6 wks to work on getting eating habits and blood sugars better before we decide to make any changes -[place] - LT cataract extraction 01/2017, RT 02/2017  2. HTN: On Atenolol, Lisinopril, Bumex and states she also takes HCTZ but I do not see on med list.  I do see prescription written in historical medication list. States she took all last dose today and needs a refill  3. Migraine -doing ok -Botox inj Q 6 wks which helps  4.  DDD cervical spine -followed by Dr. Letta Pate.  On Tramadol through pain management agreement with them  Patient Active Problem List   Diagnosis Date Noted  . Torn left ear lobe 11/20/2016  . Rash 11/20/2016  . Chronic migraine w/o aura w/o status migrainosus, not intractable 06/27/2016  . Post-menopausal atrophic vaginitis 11/14/2015  . Pain, dental 07/23/2015  . Primary osteoarthritis of both knees 07/18/2015  . De Quervain's  tenosynovitis, right 05/08/2015  . Chronic headache 05/08/2015  . GERD (gastroesophageal reflux disease) 05/08/2015  . Renal cyst, right 05/08/2015  . Adenomatous polyp of colon 03/26/2015  . HTN (hypertension) 02/12/2015  . Cognitive deficit due to old head trauma 02/12/2015  . Asthma, chronic 02/12/2015  . Chronic left shoulder pain 02/11/2015  . Thyroid nodule 12/18/2014  . Chronic neck pain   . HLD (hyperlipidemia)   . OSA treated with BiPAP 11/27/2014  . Obesity hypoventilation syndrome ([place]) 11/27/2014  . Elevated troponin   . Demand ischemia ([place])   . Diabetes type 2, uncontrolled ([place])   . Cardiomegaly 11/25/2014  . CHF (congestive heart failure) ([place]) 11/25/2014  . DM type 2 causing CKD stage 2 ([place]) 11/25/2014     Current Outpatient Prescriptions on File Prior to Visit  Medication Sig Dispense Refill  . albuterol (PROVENTIL HFA;VENTOLIN HFA) 108 (90 BASE) MCG/ACT inhaler Inhale 2 puffs into the lungs every 6 (six) hours as needed for wheezing or shortness of breath. 1 Inhaler 3  . albuterol (PROVENTIL) (2.5 MG/3ML) 0.083% nebulizer solution INHALE CONTENTS OF 1 VIAL VIA NEBULIZER EVERY 6 HOURS AS NEEDED FOR WHEEZING OR SHORTNESS OF BREATH 450 mL 1  . Ascorbic Acid (VITAMIN C PO) Take 1 tablet by mouth daily.    Marland Kitchen aspirin 81 MG chewable tablet Chew 81 mg by mouth daily.     . calcium-vitamin D (OSCAL WITH D) 500-200 MG-UNIT tablet  Take 1 tablet by mouth daily. 30 tablet 11  . conjugated estrogens (PREMARIN) vaginal cream INSERT 0.5 GRAMS INTRAVAGINALLY TWICE WEEKLY, MONDAY AND THURSDAY 30 g 0  . dextromethorphan-guaiFENesin (MUCINEX DM) 30-600 MG 12hr tablet Take 1 tablet by mouth as needed for cough.    Marland Kitchen glucose blood (FREESTYLE TEST STRIPS) test strip Use as instructed for 3 times daily testing of blood glucose. 100 each 5  . insulin lispro protamine-lispro (HUMALOG 75/25 MIX) (75-25) 100 UNIT/ML SUSP injection Inject subcutaneously 60 U in the morning with food and 40 U  at night 30 mL 5  . INSULIN SYRINGE 1CC/29G 29G X 1/2" 1 ML MISC USE THREE TIMES DAILY AS DIRECTED 100 each 5  . Lancets (FREESTYLE) lancets 1 each by Other route 3 (three) times daily. Use as instructed 100 each 12  . Lancets (FREESTYLE) lancets USE AS DIRECTED 100 each 5  . lisinopril (PRINIVIL,ZESTRIL) 5 MG tablet Take 1 tablet (5 mg total) by mouth daily. 90 tablet 3  . Multiple Vitamin (MULTIVITAMIN WITH MINERALS) TABS tablet Take 1 tablet by mouth daily.    . nitroGLYCERIN (NITROSTAT) 0.3 MG SL tablet Place 1 tablet (0.3 mg total) under the tongue every 5 (five) minutes as needed for chest pain. Max 3 tablets in 15 minutes 90 tablet 0  . potassium chloride 20 MEQ TBCR Take 20 mEq by mouth daily. 30 tablet 5  . topiramate (TOPAMAX) 25 MG capsule Take 3 capsules (75 mg total) by mouth 2 (two) times daily. 180 capsule 11  . traMADol (ULTRAM) 50 MG tablet Take 1 tablet (50 mg total) by mouth every 8 (eight) hours as needed. 90 tablet 2   Current Facility-Administered Medications on File Prior to Visit  Medication Dose Route Frequency Provider Last Rate Last Dose  . 0.9 %  sodium chloride infusion  500 mL Intravenous Continuous Irene Shipper, MD        No Known Allergies  Social History   Social History  . Marital status: Legally Separated    Spouse name: N/A  . Number of children: 4  . Years of education: N/A   Occupational History  . Disabled    Social History Main Topics  . Smoking status: Never Smoker  . Smokeless tobacco: Never Used  . Alcohol use No  . Drug use: No  . Sexual activity: Not on file   Other Topics Concern  . Not on file   Social History Narrative   Lives with daughter   Caffeine use: 1 cup coffee per day    Family History  Problem Relation Age of Onset  . Colon cancer Father   . Diabetes type II Sister   . Diabetes type II Brother   . Colon cancer Sister   . Migraines Neg Hx     Past Surgical History:  Procedure Laterality Date  . ABDOMINAL  HYSTERECTOMY    . CATARACT EXTRACTION Left   . CATARACT EXTRACTION     LT 01/2017, 02/2017 on RT  . CESAREAN SECTION      ROS: Review of Systems Negative except as stated above PHYSICAL EXAM: BP 116/75   Pulse 98   Temp 98.4 F (36.9 C) (Oral)   Resp 16   Wt 221 lb 3.2 oz (100.3 kg)   SpO2 97%   BMI 37.97 kg/m   Wt Readings from Last 3 Encounters:  02/26/17 221 lb 3.2 oz (100.3 kg)  01/12/17 221 lb 6.4 oz (100.4 kg)  11/20/16 219 lb 9.6  oz (99.6 kg)   Physical Exam  General appearance - alert, well appearing, and in no distress Mental status - alert, oriented to person, place, and time, normal mood, behavior, speech, dress, motor activity, and thought processes Mouth - mucous membranes moist, pharynx normal without lesions Neck - supple, no significant adenopathy Chest - clear to auscultation, no wheezes, rales or rhonchi, symmetric air entry Heart - normal rate, regular rhythm, normal S1, S2, no murmurs, rubs, clicks or gallops Extremities -trace lower extremity edema  Results for orders placed or performed in visit on 02/26/17  POCT glucose (manual entry)  Result Value Ref Range   POC Glucose 202 (A) 70 - 99 mg/dl  POCT glycosylated hemoglobin (Hb A1C)  Result Value Ref Range   Hemoglobin A1C 10.0    Depression screen [facility] 2/9 11/20/2016 08/11/2016 05/21/2016 03/26/2016 01/20/2016  Decreased Interest 0 0 0 1 0  Down, Depressed, Hopeless 0 0 0 1 0  PHQ - 2 Score 0 0 0 2 0  Altered sleeping 0 0 0 0 -  Tired, decreased energy 0 0 0 1 -  Change in appetite 0 0 0 0 -  Feeling bad or failure about yourself  0 0 0 0 -  Trouble concentrating 1 0 1 0 -  Moving slowly or fidgety/restless 1 0 0 0 -  Suicidal thoughts 0 0 0 0 -  PHQ-9 Score 2 0 1 3 -  Some recent data might be hidden     ASSESSMENT AND PLAN: 1. Uncontrolled type 2 diabetes mellitus with stage 2 chronic kidney disease, with long-term current use of insulin (HCC) -I went over hypoglycemia including symptoms  and how to avoid and how to treat. -Recommend decreasing the a.m. dose of insulin and adding Januvia since she has hypoglycemia around unknown hour. Patient declines any changes stating she wants 5-6 weeks to improve things on her own -Dietary counseling given. -Recommend referral to endocrinology but patient declined. -Will have her follow up with our clinical pharmacists in about 4 weeks. Patient advised to check blood sugars before meals and bring in readings on that visit - POCT glucose (manual entry) - POCT glycosylated hemoglobin (Hb A1C)  2. Essential hypertension -at goal RF HCTZ - atenolol (TENORMIN) 25 MG tablet; Take 1 tablet (25 mg total) by mouth daily.  Dispense: 90 tablet; Refill: 6 - bumetanide (BUMEX) 0.5 MG tablet; Take 1 tablet (0.5 mg total) by mouth daily.  Dispense: 90 tablet; Refill: 3  3. Acute on chronic diastolic congestive heart failure (HCC) - atenolol (TENORMIN) 25 MG tablet; Take 1 tablet (25 mg total) by mouth daily.  Dispense: 90 tablet; Refill: 6  4. Gastroesophageal reflux disease, esophagitis presence not specified - omeprazole (PRILOSEC) 40 MG capsule; TAKE 1 CAPSULE(40 MG) BY MOUTH DAILY  Dispense: 90 capsule; Refill: 1  5. Asthma, chronic, unspecified asthma severity, uncomplicated - montelukast (SINGULAIR) 10 MG tablet; Take 1 tablet (10 mg total) by mouth daily.  Dispense: 90 tablet; Refill: 1   Patient was given the opportunity to ask questions.  Patient verbalized understanding of the plan and was able to repeat key elements of the plan.   Orders Placed This Encounter  Procedures  . POCT glucose (manual entry)  . POCT glycosylated hemoglobin (Hb A1C)     Requested Prescriptions   Signed Prescriptions Disp Refills  . hydrochlorothiazide (HYDRODIURIL) 25 MG tablet 90 tablet 3    Sig: Take 1 tablet (25 mg total) by mouth daily.  Marland Kitchen atenolol (TENORMIN) 25  MG tablet 90 tablet 6    Sig: Take 1 tablet (25 mg total) by mouth daily.  Marland Kitchen  omeprazole (PRILOSEC) 40 MG capsule 90 capsule 1    Sig: TAKE 1 CAPSULE(40 MG) BY MOUTH DAILY  . atorvastatin (LIPITOR) 40 MG tablet 90 tablet 0    Sig: TAKE 1 TABLET BY MOUTH DAILY AT 6 PM  . montelukast (SINGULAIR) 10 MG tablet 90 tablet 1    Sig: Take 1 tablet (10 mg total) by mouth daily.  . bumetanide (BUMEX) 0.5 MG tablet 90 tablet 3    Sig: Take 1 tablet (0.5 mg total) by mouth daily.    Return in about 7 weeks (around 04/16/2017).  Karle Plumber, MD, FACP

## 2017-03-03 ENCOUNTER — Telehealth: Payer: Self-pay | Admitting: Neurology

## 2017-03-03 ENCOUNTER — Ambulatory Visit (INDEPENDENT_AMBULATORY_CARE_PROVIDER_SITE_OTHER): Payer: Medicare Other | Admitting: Neurology

## 2017-03-03 ENCOUNTER — Encounter: Payer: Self-pay | Admitting: Neurology

## 2017-03-03 VITALS — BP 111/67 | HR 76 | Resp 16 | Ht 64.0 in | Wt 220.2 lb

## 2017-03-03 DIAGNOSIS — G43709 Chronic migraine without aura, not intractable, without status migrainosus: Secondary | ICD-10-CM

## 2017-03-03 NOTE — Progress Notes (Signed)
Botox-100unitsx2 vials Lot: [id number] Expiration: 09/2019 NDC: 9937-1696-78 [id number]  0.9% Sodium Chloride bacteriostatic- 41mL total Lot: [id number] Expiration: 12/04/2017 NDC: 2353-6144-31  Dx: G43.709 B/B    Consent Form Botulism Toxin Injection For Chronic Migraine  Botulism toxin has been approved by the Federal drug administration for treatment of chronic migraine. Botulism toxin does not cure chronic migraine and it may not be effective in some patients.  The administration of botulism toxin is accomplished by injecting a small amount of toxin into the muscles of the neck and head. Dosage must be titrated for each individual. Any benefits resulting from botulism toxin tend to wear off after 3 months with a repeat injection required if benefit is to be maintained. Injections are usually done every 3-4 months with maximum effect peak achieved by about 2 or 3 weeks. Botulism toxin is expensive and you should be sure of what costs you will incur resulting from the injection.  The side effects of botulism toxin use for chronic migraine may include:   -Transient, and usually mild, facial weakness with facial injections  -Transient, and usually mild, head or neck weakness with head/neck injections  -Reduction or loss of forehead facial animation due to forehead muscle              weakness  -Eyelid drooping  -Dry eye  -Pain at the site of injection or bruising at the site of injection  -Double vision  -Potential unknown long term risks  Contraindications: You should not have Botox if you are pregnant, nursing, allergic to albumin, have an infection, skin condition, or muscle weakness at the site of the injection, or have myasthenia gravis, Lambert-Eaton syndrome, or ALS.  It is also possible that as with any injection, there may be an allergic reaction or no effect from the medication. Reduced effectiveness after repeated injections is sometimes seen and rarely infection at the  injection site may occur. All care will be taken to prevent these side effects. If therapy is given over a long time, atrophy and wasting in the muscle injected may occur. Occasionally the patient's become refractory to treatment because they develop antibodies to the toxin. In this event, therapy needs to be modified.  I have read the above information and consent to the administration of botulism toxin.    ______________  _____   _________________  Patient signature     Date   Witness signature       BOTOX PROCEDURE NOTE FOR MIGRAINE HEADACHE    Contraindications and precautions discussed with patient(above). Aseptic procedure was observed and patient tolerated procedure. Procedure performed by Dr. Georgia Dom  The condition has existed for more than 6 months, and pt does not have a diagnosis of ALS, Myasthenia Gravis or Lambert-Eaton Syndrome. Risks and benefits of injections discussed and pt agrees to proceed with the procedure. Written consent obtained  These injections are medically necessary. He receives good benefits from these injections. These injections do not cause sedations or hallucinations which the oral therapies may cause.  Indication/Diagnosis: chronic migraine BOTOX(J0585) injection was performed according to protocol by [employer]. 200 units of BOTOX was dissolved into 4 cc NS.  NDC: [address]-95  Description of procedure:  The patient was placed in a sitting position. The standard protocol was used for Botox as follows, with 5 units of Botox injected at each site:   -Procerus muscle, midline injection  -Corrugator muscle, bilateral injection  -Frontalis muscle, bilateral injection, with 2 sites each side, medial injection was performed in the  upper one third of the frontalis muscle, in the region vertical from the medial inferior edge of the superior orbital rim. The lateral injection was again in the upper one third of the forehead vertically above the  lateral limbus of the cornea, 1.5 cm lateral to the medial injection site.  -Temporalis muscle injection, 4 sites, bilaterally. The first injection was 3 cm above the tragus of the ear, second injection site was 1.5 cm to 3 cm up from the first injection site in line with the tragus of the ear. The third injection site was 1.5-3 cm forward between the first 2 injection sites. The fourth injection site was 1.5 cm posterior to the second injection site.  -Occipitalis muscle injection, 3 sites, bilaterally. The first injection was done one half way between the occipital protuberance and the tip of the mastoid process behind the ear. The second injection site was done lateral and superior to the first, 1 fingerbreadth from the first injection. The third injection site was 1 fingerbreadth superiorly and medially from the first injection site.  -Cervical paraspinal muscle injection, 2 sites, bilateral knee first injection site was 1 cm from the midline of the cervical spine, 3 cm inferior to the lower border of the occipital protuberance. The second injection site was 1.5 cm superiorly and laterally to the first injection site.  -Trapezius muscle injection was performed at 3 sites, bilaterally. The first injection site was in the upper trapezius muscle halfway between the inflection point of the neck, and the acromion. The second injection site was one half way between the acromion and the first injection site. The third injection was done between the first injection site and the inflection point of the neck.   Will return for repeat injection in 3 months.   A 200 unit sof Botox was used, 155 units were injected, the rest of the Botox was wasted. The patient tolerated the procedure well, there were no complications of the above procedure.

## 2017-03-03 NOTE — Telephone Encounter (Signed)
PT needs botox

## 2017-03-04 DIAGNOSIS — R262 Difficulty in walking, not elsewhere classified: Secondary | ICD-10-CM | POA: Diagnosis not present

## 2017-03-04 DIAGNOSIS — M25562 Pain in left knee: Secondary | ICD-10-CM | POA: Diagnosis not present

## 2017-03-04 DIAGNOSIS — M170 Bilateral primary osteoarthritis of knee: Secondary | ICD-10-CM | POA: Diagnosis not present

## 2017-03-04 DIAGNOSIS — M25561 Pain in right knee: Secondary | ICD-10-CM | POA: Diagnosis not present

## 2017-03-05 ENCOUNTER — Other Ambulatory Visit: Payer: Self-pay | Admitting: Family Medicine

## 2017-03-09 NOTE — Telephone Encounter (Signed)
I called and spoke with the patient, she stated that it was not a good time to schedule apt and she would call me back.

## 2017-03-11 DIAGNOSIS — M25561 Pain in right knee: Secondary | ICD-10-CM | POA: Diagnosis not present

## 2017-03-11 DIAGNOSIS — M1711 Unilateral primary osteoarthritis, right knee: Secondary | ICD-10-CM | POA: Diagnosis not present

## 2017-03-15 ENCOUNTER — Other Ambulatory Visit: Payer: Self-pay | Admitting: Internal Medicine

## 2017-03-15 DIAGNOSIS — IMO0002 Reserved for concepts with insufficient information to code with codable children: Secondary | ICD-10-CM

## 2017-03-15 DIAGNOSIS — E1165 Type 2 diabetes mellitus with hyperglycemia: Secondary | ICD-10-CM

## 2017-03-18 DIAGNOSIS — M25562 Pain in left knee: Secondary | ICD-10-CM | POA: Diagnosis not present

## 2017-03-18 DIAGNOSIS — M1712 Unilateral primary osteoarthritis, left knee: Secondary | ICD-10-CM | POA: Diagnosis not present

## 2017-03-24 DIAGNOSIS — M25562 Pain in left knee: Secondary | ICD-10-CM | POA: Diagnosis not present

## 2017-03-24 DIAGNOSIS — M170 Bilateral primary osteoarthritis of knee: Secondary | ICD-10-CM | POA: Diagnosis not present

## 2017-03-24 DIAGNOSIS — M25561 Pain in right knee: Secondary | ICD-10-CM | POA: Diagnosis not present

## 2017-03-30 ENCOUNTER — Ambulatory Visit: Payer: Medicare Other | Attending: Internal Medicine | Admitting: Pharmacist

## 2017-03-30 DIAGNOSIS — E1165 Type 2 diabetes mellitus with hyperglycemia: Secondary | ICD-10-CM

## 2017-03-30 DIAGNOSIS — IMO0002 Reserved for concepts with insufficient information to code with codable children: Secondary | ICD-10-CM

## 2017-03-30 DIAGNOSIS — N182 Chronic kidney disease, stage 2 (mild): Secondary | ICD-10-CM

## 2017-03-30 DIAGNOSIS — E1122 Type 2 diabetes mellitus with diabetic chronic kidney disease: Secondary | ICD-10-CM

## 2017-03-30 DIAGNOSIS — Z794 Long term (current) use of insulin: Secondary | ICD-10-CM

## 2017-03-30 DIAGNOSIS — E11649 Type 2 diabetes mellitus with hypoglycemia without coma: Secondary | ICD-10-CM | POA: Diagnosis not present

## 2017-03-30 NOTE — Patient Instructions (Addendum)
Thanks for coming to see [place]!  You are doing great!  Continue current medications  Follow up with Dr. Wynetta Emery as scheduled in October

## 2017-03-30 NOTE — Progress Notes (Signed)
    S:     Chief Complaint  Patient presents with  . Medication Management    Patient arrives in good spirits.  Presents for diabetes evaluation, education, and management at the request of Dr. Wynetta Emery. Patient was referred on 02/26/17.  Patient was last seen by Primary Care Provider on 02/26/17.   Patient reports adherence with medications.  Current diabetes medications include: Humalog 75/25 60 units in the morning and 40 units in the evening.:   Patient reports hypoglycemic events. She only had one reading in the 70s and this was on a day when she didn't eat as much. She treats her hypoglycemia with butterscotch candy. She is very concerned about hypoglycemia.  Patient reported dietary habits: Eats 3 meals/day. She has been working hard on her diet to cut out sugar and foods with a lot of carbs. Breakfast: fruits and toast Lunch:salad Dinner: baked meats and salad Snacks: none Drinks:water, prune juice   Patient reported exercise habits: none   Patient denies nocturia.  Patient denies neuropathy. Patient denies visual changes. Patient reports self foot exams.    O:  Physical Exam   ROS   Lab Results  Component Value Date   HGBA1C 10.0 02/26/2017   There were no vitals filed for this visit.  Home fasting CBG: 100s-120s, 140 2 hour post-prandial/random CBG: 80s-110s in morning, 75, 110s-150s before dinner   A/P: Diabetes longstanding currently uncontrolled based on A1c of 10 but improved based on home readings. Patient reports hypoglycemic events in the 70s and is able to verbalize appropriate hypoglycemia management plan. Patient reports adherence with medication.   Continue current medications as prescribed. Discussed decreasing her morning dose of insulin but patient did not want to. She is very happy with her current dose and her control of her blood sugars and feels that the readings in the 70s was due to not eating enough. Patient encouraged to continue to eat  3 fulls meals a day.  Next A1C anticipated November 2018.     Written patient instructions provided.  Total time in face to face counseling 20 minutes.   Follow up in Pharmacist Clinic Visit PRN, next visit in 2 weeks with Dr. Wynetta Emery.   Patient seen with Waverly Ferrari, PharmD Candidate

## 2017-04-05 ENCOUNTER — Other Ambulatory Visit: Payer: Self-pay | Admitting: Internal Medicine

## 2017-04-06 DIAGNOSIS — H59031 Cystoid macular edema following cataract surgery, right eye: Secondary | ICD-10-CM | POA: Diagnosis not present

## 2017-04-16 ENCOUNTER — Ambulatory Visit: Payer: Medicare Other | Admitting: Internal Medicine

## 2017-04-16 ENCOUNTER — Other Ambulatory Visit: Payer: Self-pay | Admitting: Pharmacist

## 2017-04-16 DIAGNOSIS — E1165 Type 2 diabetes mellitus with hyperglycemia: Principal | ICD-10-CM

## 2017-04-16 DIAGNOSIS — E1122 Type 2 diabetes mellitus with diabetic chronic kidney disease: Secondary | ICD-10-CM

## 2017-04-16 DIAGNOSIS — IMO0002 Reserved for concepts with insufficient information to code with codable children: Secondary | ICD-10-CM

## 2017-04-16 DIAGNOSIS — N182 Chronic kidney disease, stage 2 (mild): Principal | ICD-10-CM

## 2017-04-16 DIAGNOSIS — Z794 Long term (current) use of insulin: Principal | ICD-10-CM

## 2017-04-16 MED ORDER — INSULIN LISPRO PROT & LISPRO (75-25 MIX) 100 UNIT/ML ~~LOC~~ SUSP: SUBCUTANEOUS | 2 refills | Status: DC

## 2017-04-18 DIAGNOSIS — Z23 Encounter for immunization: Secondary | ICD-10-CM | POA: Diagnosis not present

## 2017-04-19 ENCOUNTER — Ambulatory Visit: Payer: Medicare Other | Admitting: Registered Nurse

## 2017-04-19 ENCOUNTER — Encounter: Payer: Medicare Other | Attending: Physical Medicine & Rehabilitation | Admitting: Registered Nurse

## 2017-04-19 DIAGNOSIS — G8929 Other chronic pain: Secondary | ICD-10-CM | POA: Insufficient documentation

## 2017-04-19 DIAGNOSIS — M47812 Spondylosis without myelopathy or radiculopathy, cervical region: Secondary | ICD-10-CM | POA: Insufficient documentation

## 2017-04-19 DIAGNOSIS — E119 Type 2 diabetes mellitus without complications: Secondary | ICD-10-CM | POA: Insufficient documentation

## 2017-04-19 DIAGNOSIS — M4802 Spinal stenosis, cervical region: Secondary | ICD-10-CM | POA: Insufficient documentation

## 2017-04-19 DIAGNOSIS — I110 Hypertensive heart disease with heart failure: Secondary | ICD-10-CM | POA: Insufficient documentation

## 2017-04-19 DIAGNOSIS — E7800 Pure hypercholesterolemia, unspecified: Secondary | ICD-10-CM | POA: Insufficient documentation

## 2017-04-19 DIAGNOSIS — F419 Anxiety disorder, unspecified: Secondary | ICD-10-CM | POA: Insufficient documentation

## 2017-04-19 DIAGNOSIS — I509 Heart failure, unspecified: Secondary | ICD-10-CM | POA: Insufficient documentation

## 2017-05-03 ENCOUNTER — Telehealth: Payer: Self-pay | Admitting: Neurology

## 2017-05-10 ENCOUNTER — Emergency Department (HOSPITAL_COMMUNITY): Payer: Medicare Other

## 2017-05-10 ENCOUNTER — Encounter (HOSPITAL_COMMUNITY): Payer: Self-pay

## 2017-05-10 DIAGNOSIS — E119 Type 2 diabetes mellitus without complications: Secondary | ICD-10-CM | POA: Diagnosis not present

## 2017-05-10 DIAGNOSIS — Z79899 Other long term (current) drug therapy: Secondary | ICD-10-CM | POA: Insufficient documentation

## 2017-05-10 DIAGNOSIS — J45909 Unspecified asthma, uncomplicated: Secondary | ICD-10-CM | POA: Diagnosis not present

## 2017-05-10 DIAGNOSIS — Z794 Long term (current) use of insulin: Secondary | ICD-10-CM | POA: Diagnosis not present

## 2017-05-10 DIAGNOSIS — I509 Heart failure, unspecified: Secondary | ICD-10-CM | POA: Insufficient documentation

## 2017-05-10 DIAGNOSIS — R0789 Other chest pain: Secondary | ICD-10-CM | POA: Insufficient documentation

## 2017-05-10 DIAGNOSIS — R42 Dizziness and giddiness: Secondary | ICD-10-CM | POA: Diagnosis not present

## 2017-05-10 DIAGNOSIS — N182 Chronic kidney disease, stage 2 (mild): Secondary | ICD-10-CM | POA: Diagnosis not present

## 2017-05-10 DIAGNOSIS — R079 Chest pain, unspecified: Secondary | ICD-10-CM | POA: Diagnosis present

## 2017-05-10 DIAGNOSIS — I130 Hypertensive heart and chronic kidney disease with heart failure and stage 1 through stage 4 chronic kidney disease, or unspecified chronic kidney disease: Secondary | ICD-10-CM | POA: Diagnosis not present

## 2017-05-10 DIAGNOSIS — R1012 Left upper quadrant pain: Secondary | ICD-10-CM | POA: Diagnosis not present

## 2017-05-10 LAB — CBC
HCT: 38.2 % (ref 36.0–46.0)
HEMOGLOBIN: 12.4 g/dL (ref 12.0–15.0)
MCH: 27.7 pg (ref 26.0–34.0)
MCHC: 32.5 g/dL (ref 30.0–36.0)
MCV: 85.5 fL (ref 78.0–100.0)
Platelets: 222 10*3/uL (ref 150–400)
RBC: 4.47 MIL/uL (ref 3.87–5.11)
RDW: 13.7 % (ref 11.5–15.5)
WBC: 8.5 10*3/uL (ref 4.0–10.5)

## 2017-05-10 LAB — BASIC METABOLIC PANEL
ANION GAP: 8 (ref 5–15)
BUN: 12 mg/dL (ref 6–20)
CALCIUM: 9.4 mg/dL (ref 8.9–10.3)
CO2: 26 mmol/L (ref 22–32)
Chloride: 102 mmol/L (ref 101–111)
Creatinine, Ser: 1.13 mg/dL — ABNORMAL HIGH (ref 0.44–1.00)
GFR, EST AFRICAN AMERICAN: 58 mL/min — AB (ref >60)
GFR, EST NON AFRICAN AMERICAN: 50 mL/min — AB (ref >60)
Glucose, Bld: 207 mg/dL — ABNORMAL HIGH (ref 65–99)
Potassium: 3.3 mmol/L — ABNORMAL LOW (ref 3.5–5.1)
Sodium: 136 mmol/L (ref 135–145)

## 2017-05-10 LAB — I-STAT TROPONIN, ED: TROPONIN I, POC: 0 ng/mL (ref 0.00–0.08)

## 2017-05-10 NOTE — ED Triage Notes (Signed)
Pt states that central CP started suddenly around 5pm with nausea and some dizziness. Pt states that she feels weak all over, no neuro defects noted

## 2017-05-11 ENCOUNTER — Other Ambulatory Visit: Payer: Self-pay

## 2017-05-11 ENCOUNTER — Emergency Department (HOSPITAL_COMMUNITY)
Admission: EM | Admit: 2017-05-11 | Discharge: 2017-05-11 | Disposition: A | Discharge status: Home / Self Care | Payer: Medicare Other | Attending: Emergency Medicine | Admitting: Emergency Medicine

## 2017-05-11 DIAGNOSIS — R42 Dizziness and giddiness: Secondary | ICD-10-CM

## 2017-05-11 DIAGNOSIS — R0789 Other chest pain: Secondary | ICD-10-CM | POA: Diagnosis not present

## 2017-05-11 LAB — HEPATIC FUNCTION PANEL
ALBUMIN: 3.7 g/dL (ref 3.5–5.0)
ALK PHOS: 98 U/L (ref 38–126)
ALT: 36 U/L (ref 14–54)
AST: 29 U/L (ref 15–41)
BILIRUBIN TOTAL: 0.4 mg/dL (ref 0.3–1.2)
Total Protein: 6.5 g/dL (ref 6.5–8.1)

## 2017-05-11 LAB — URINALYSIS, ROUTINE W REFLEX MICROSCOPIC
BILIRUBIN URINE: NEGATIVE
Bacteria, UA: NONE SEEN
Glucose, UA: NEGATIVE mg/dL
Hgb urine dipstick: NEGATIVE
KETONES UR: NEGATIVE mg/dL
Nitrite: NEGATIVE
Protein, ur: NEGATIVE mg/dL
SPECIFIC GRAVITY, URINE: 1.024 (ref 1.005–1.030)
pH: 5 (ref 5.0–8.0)

## 2017-05-11 LAB — LIPASE, BLOOD: LIPASE: 47 U/L (ref 11–51)

## 2017-05-11 MED ORDER — AZITHROMYCIN 250 MG PO TABS: 250.0000 mg | ORAL_TABLET | Freq: Every day | ORAL | 0 refills | Status: DC

## 2017-05-11 MED ORDER — IPRATROPIUM-ALBUTEROL 0.5-2.5 (3) MG/3ML IN SOLN
3.0000 mL | Freq: Once | RESPIRATORY_TRACT | Status: AC
  Administered 2017-05-11: 3 mL via RESPIRATORY_TRACT
  Filled 2017-05-11: qty 3

## 2017-05-11 NOTE — Discharge Instructions (Signed)
Take Z-pack for the next 5 days Follow up with your doctor

## 2017-05-11 NOTE — ED Provider Notes (Signed)
[facility] EMERGENCY DEPARTMENT Provider Note   CSN: [id number] Arrival date & time: 05/10/17  2038     History   Chief Complaint Chief Complaint  Patient presents with  . Chest Pain    HPI Heidi [name] is a 66 y.o. female who presents with chest pain.  Past medical history significant for CHF, insulin-dependent diabetes, hypertension, hyperlipidemia, anxiety.  He states that she was driving in her car about 5 PM she had acute onset of dizziness and chest tightness and shortness of breath.  She states that she also got very nauseous because she was so dizzy.  She did not vomit.  She felt scared to drive so pulled over.  She came to the ED because she was worried because she is older and has multiple health problems.  She states she also feels lightheaded without syncope.  She is still having centralized chest pain is also having left-sided flank pain.  No diarrhea, constipation, or urinary symptoms.  She states she is from [place] and has had a cardiac cath in the past which did not require stents.  She also states that she has "inner ear problems" and is taking Mucinex.  She denies URI symptoms or fever.  HPI  Past Medical History:  Diagnosis Date  . Adenomatous colon polyp   . Anemia   . Anxiety   . Asthma   . CHF (congestive heart failure) ([place])   . Diabetes mellitus without complication ([place])   . High cholesterol   . Hypertension     Patient Active Problem List   Diagnosis Date Noted  . Torn left ear lobe 11/20/2016  . Rash 11/20/2016  . Chronic migraine w/o aura w/o status migrainosus, not intractable 06/27/2016  . Post-menopausal atrophic vaginitis 11/14/2015  . Pain, dental 07/23/2015  . Primary osteoarthritis of both knees 07/18/2015  . De Quervain's tenosynovitis, right 05/08/2015  . Chronic headache 05/08/2015  . GERD (gastroesophageal reflux disease) 05/08/2015  . Renal cyst, right 05/08/2015  . Adenomatous polyp of colon 03/26/2015  . HTN  (hypertension) 02/12/2015  . Cognitive deficit due to old head trauma 02/12/2015  . Asthma, chronic 02/12/2015  . Chronic left shoulder pain 02/11/2015  . Thyroid nodule 12/18/2014  . Chronic neck pain   . HLD (hyperlipidemia)   . OSA treated with BiPAP 11/27/2014  . Obesity hypoventilation syndrome ([place]) 11/27/2014  . Elevated troponin   . Demand ischemia ([place])   . Diabetes type 2, uncontrolled ([place])   . Cardiomegaly 11/25/2014  . CHF (congestive heart failure) ([place]) 11/25/2014  . DM type 2 causing CKD stage 2 ([place]) 11/25/2014    Past Surgical History:  Procedure Laterality Date  . ABDOMINAL HYSTERECTOMY    . CATARACT EXTRACTION Left   . CATARACT EXTRACTION     LT 01/2017, 02/2017 on RT  . CESAREAN SECTION      OB History    No data available       Home Medications    Prior to Admission medications   Medication Sig Start Date End Date Taking? Authorizing Provider  albuterol (PROVENTIL HFA;VENTOLIN HFA) 108 (90 BASE) MCG/ACT inhaler Inhale 2 puffs into the lungs every 6 (six) hours as needed for wheezing or shortness of breath. 12/31/14   Arnoldo Morale, MD  albuterol (PROVENTIL) (2.5 MG/3ML) 0.083% nebulizer solution INHALE CONTENTS OF 1 VIAL VIA NEBULIZER EVERY 6 HOURS AS NEEDED FOR WHEEZING OR SHORTNESS OF BREATH 07/10/15   Funches, Adriana Mccallum, MD  Ascorbic Acid (VITAMIN C PO) Take  1 tablet by mouth daily.    [provider]  aspirin 81 MG chewable tablet Chew 81 mg by mouth daily.     [provider]  atenolol (TENORMIN) 25 MG tablet Take 1 tablet (25 mg total) by mouth daily. 02/26/17   Ladell Pier, MD  atorvastatin (LIPITOR) 40 MG tablet TAKE 1 TABLET BY MOUTH DAILY AT 6 PM 02/26/17   Ladell Pier, MD  bumetanide (BUMEX) 0.5 MG tablet Take 1 tablet (0.5 mg total) by mouth daily. 02/26/17   Ladell Pier, MD  calcium-vitamin D (OSCAL WITH D) 500-200 MG-UNIT tablet Take 1 tablet by mouth daily. 05/12/16   Funches, Adriana Mccallum, MD    dextromethorphan-guaiFENesin (MUCINEX DM) 30-600 MG 12hr tablet Take 1 tablet by mouth as needed for cough.    [provider]  glucose blood (FREESTYLE TEST STRIPS) test strip Use as instructed for 3 times daily testing of blood glucose. 10/08/16   Funches, Adriana Mccallum, MD  hydrochlorothiazide (HYDRODIURIL) 25 MG tablet Take 1 tablet (25 mg total) by mouth daily. 02/26/17   Ladell Pier, MD  insulin lispro protamine-lispro (HUMALOG 75/25 MIX) (75-25) 100 UNIT/ML SUSP injection Inject subcutaneously 60 U in the morning with food and 40 U at night 04/16/17   Ladell Pier, MD  INSULIN SYRINGE 1CC/29G 29G X 1/2" 1 ML MISC USE THREE TIMES DAILY AS DIRECTED 03/16/17   Ladell Pier, MD  Lancets (FREESTYLE) lancets 1 each by Other route 3 (three) times daily. Use as instructed 09/20/15   Boykin Nearing, MD  Lancets (FREESTYLE) lancets USE AS DIRECTED 10/27/16   Funches, Adriana Mccallum, MD  lisinopril (PRINIVIL,ZESTRIL) 5 MG tablet Take 1 tablet (5 mg total) by mouth daily. 05/12/16   Funches, Adriana Mccallum, MD  montelukast (SINGULAIR) 10 MG tablet Take 1 tablet (10 mg total) by mouth daily. 02/26/17   Ladell Pier, MD  Multiple Vitamin (MULTIVITAMIN WITH MINERALS) TABS tablet Take 1 tablet by mouth daily.    [provider]  nitroGLYCERIN (NITROSTAT) 0.3 MG SL tablet Place 1 tablet (0.3 mg total) under the tongue every 5 (five) minutes as needed for chest pain. Max 3 tablets in 15 minutes 08/07/16   Boykin Nearing, MD  omeprazole (PRILOSEC) 40 MG capsule TAKE 1 CAPSULE(40 MG) BY MOUTH DAILY 02/26/17   Ladell Pier, MD  potassium chloride 20 MEQ TBCR Take 20 mEq by mouth daily. 11/22/16   Funches, Adriana Mccallum, MD  PREMARIN vaginal cream INSERT 0.5 GRAMS VAGINALLY TWICE WEEKLY( ON MONDAY AND THURSDAY) 04/05/17   Ladell Pier, MD  topiramate (TOPAMAX) 25 MG capsule Take 3 capsules (75 mg total) by mouth 2 (two) times daily. 01/12/17   Melvenia Beam, MD  traMADol (ULTRAM) 50 MG tablet  Take 1 tablet (50 mg total) by mouth every 8 (eight) hours as needed. 02/19/17   Bayard Hugger, NP    Family History Family History  Problem Relation Age of Onset  . Colon cancer Father   . Diabetes type II Sister   . Diabetes type II Brother   . Colon cancer Sister   . Migraines Neg Hx     Social History Social History   Tobacco Use  . Smoking status: Never Smoker  . Smokeless tobacco: Never Used  Substance Use Topics  . Alcohol use: No  . Drug use: No     Allergies   Patient has no known allergies.   Review of Systems Review of Systems  Constitutional: Positive for chills. Negative  for fever.  Respiratory: Positive for cough and shortness of breath.   Cardiovascular: Positive for chest pain and leg swelling.  Gastrointestinal: Positive for abdominal pain, nausea and vomiting. Negative for constipation and diarrhea.  Genitourinary: Positive for flank pain. Negative for dysuria.  Neurological: Positive for light-headedness. Negative for syncope.     Physical Exam Updated Vital Signs BP 134/62   Pulse 78   Temp 98.3 F (36.8 C) (Oral)   Resp 17   Ht 5\' 4"  (1.626 m)   Wt 99.8 kg (220 lb)   SpO2 94%   BMI 37.76 kg/m   Physical Exam  Constitutional: She is oriented to person, place, and time. She appears well-developed and well-nourished. No distress.  Obese, anxious, cooperative  HENT:  Head: Normocephalic and atraumatic.  Right Ear: Hearing, tympanic membrane, external ear and ear canal normal.  Left Ear: Hearing, tympanic membrane, external ear and ear canal normal.  Nose: Nose normal.  Mouth/Throat: Uvula is midline, oropharynx is clear and moist and mucous membranes are normal.  Eyes: Conjunctivae are normal. Pupils are equal, round, and reactive to light. Right eye exhibits no discharge. Left eye exhibits no discharge. No scleral icterus.  Neck: Normal range of motion.  Cardiovascular: Normal rate and regular rhythm. Exam reveals no gallop and no  friction rub.  No murmur heard. Pulmonary/Chest: Effort normal and breath sounds normal. No stridor. No respiratory distress. She has no wheezes. She has no rales. She exhibits tenderness (Centralized).  Abdominal: Soft. Bowel sounds are normal. She exhibits no distension and no mass. There is no tenderness. There is no rebound and no guarding. No hernia.  Neurological: She is alert and oriented to person, place, and time.  Skin: Skin is warm and dry.  Psychiatric: She has a normal mood and affect. Her behavior is normal.  Nursing note and vitals reviewed.    ED Treatments / Results  Labs (all labs ordered are listed, but only abnormal results are displayed) Labs Reviewed  BASIC METABOLIC PANEL - Abnormal; Notable for the following components:      Result Value   Potassium 3.3 (*)    Glucose, Bld 207 (*)    Creatinine, Ser 1.13 (*)    GFR calc non Af Amer 50 (*)    GFR calc Af Amer 58 (*)    All other components within normal limits  HEPATIC FUNCTION PANEL - Abnormal; Notable for the following components:   Bilirubin, Direct <0.1 (*)    All other components within normal limits  URINALYSIS, ROUTINE W REFLEX MICROSCOPIC - Abnormal; Notable for the following components:   APPearance HAZY (*)    Leukocytes, UA LARGE (*)    Squamous Epithelial / LPF 0-5 (*)    Non Squamous Epithelial 0-5 (*)    All other components within normal limits  CBC  LIPASE, BLOOD  I-STAT TROPONIN, ED    EKG  EKG Interpretation  Date/Time:  Monday May 10 2017 20:51:48 EST Ventricular Rate:  84 PR Interval:  172 QRS Duration: 80 QT Interval:  348 QTC Calculation: 411 R Axis:   15 Text Interpretation:  Normal sinus rhythm Low voltage QRS Cannot rule out Anterior infarct , age undetermined Abnormal ECG Confirmed by Veryl Speak [phone]) on 05/12/2017 7:14:43 AM       Radiology Dg Chest 2 View  Result Date: 05/10/2017 CLINICAL DATA:  Chest pain and dizziness EXAM: CHEST  2 VIEW COMPARISON:   Chest radiograph 06/15/2016 FINDINGS: Mild bibasilar atelectasis. No focal consolidation. No pleural  effusion or pneumothorax. Normal cardiomediastinal contours. Unremarkable bones. IMPRESSION: Bibasilar atelectasis. Electronically Signed   By: Ulyses Jarred M.D.   On: 05/10/2017 21:40    Procedures Procedures (including critical care time)  Medications Ordered in ED Medications  ipratropium-albuterol (DUONEB) 0.5-2.5 (3) MG/3ML nebulizer solution 3 mL (3 mLs Nebulization Given 05/11/17 0402)     Initial Impression / Assessment and Plan / ED Course  I have reviewed the triage vital signs and the nursing notes.  Pertinent labs & imaging results that were available during my care of the patient were reviewed by me and considered in my medical decision making (see chart for details).  66 year old female presents with chest pain, dizziness, L flank pain. She is in no acute distress. She has reproducible chest tenderness on exam. She is mildly tachypneic but otherwise vitals are normal. She is somewhat of a poor historian and has pan-positive ROS so it is difficult pinpointing her true complaint. CBC is unremarkable. BMP is remarkable for mild hypokalemia (3.3), hyperglycemia (207), mildly elevated Scr (1.13). UA is clean. LFT and lipase were added on due to abdominal complaints which were normal. Trop is normal. CXR is negative. EKG is NSR. She was given a breathing tx which provided mild relief. She reports a lot of sinus congestion and states she usually gets a lot better with a Zpack. I see no definite indication for tx with antibiotics but she states this always helps these symptoms. Will try trial of Azithromycin and advised her to f/u with her PCP. Return precautions given.  Final Clinical Impressions(s) / ED Diagnoses   Final diagnoses:  Atypical chest pain  Dizziness    ED Discharge Orders    None       Recardo Evangelist, PA-C 05/12/17 1036    Dina Rich, Barbette Hair, MD 05/14/17  1238

## 2017-05-11 NOTE — ED Notes (Signed)
Pt verbalizes understanding of d/c instructions. Pt received prescriptions. Pt taken out to lobby in wheelchair at d/c with all belongings.   

## 2017-05-11 NOTE — ED Notes (Signed)
Pt really upset about having to wait 20 min on her room after having her change on a hospital gown, pt oriented that while she was changing on a hospital gown this RN discharge 2 more pt's to be able to get two more pt's from the waiting room, as soon as I took care of dc pt's out I returned to her room to take care of her and the provider will be on the room any time soon. Pt still very upset about the wait to get some one on the room.

## 2017-05-11 NOTE — ED Notes (Signed)
DC instructions given at 3:24 pt finished dressing up at 658, pt wheeled out on wc, stable.

## 2017-05-17 ENCOUNTER — Other Ambulatory Visit: Payer: Self-pay | Admitting: Internal Medicine

## 2017-05-19 ENCOUNTER — Encounter: Payer: Self-pay | Admitting: Internal Medicine

## 2017-05-19 NOTE — Progress Notes (Signed)
Received refill request for Premarin vaginal cream.  Phone call placed to patient this a.m. to inquire how long she had been on the cream.  Patient thinks she has been on it for about a year and a half or more but not certain why.  I asked whether she was having hot flashes or vaginal dryness at the time that Dr. Adrian Blackwater placed on the medication.  She endorsed both.  She is up-to-date with her mammogram.  I advised that we try to cut back to once a week.  If she does well with that for several weeks then we may consider stopping it.  Patient is agreeable to this. She missed her last appointment with me in October.  I will send message to the scheduler to have her rescheduled. Pt agreeable to this.

## 2017-05-27 ENCOUNTER — Other Ambulatory Visit: Payer: Self-pay | Admitting: Internal Medicine

## 2017-05-27 DIAGNOSIS — K219 Gastro-esophageal reflux disease without esophagitis: Secondary | ICD-10-CM

## 2017-05-31 ENCOUNTER — Other Ambulatory Visit: Payer: Self-pay | Admitting: Pharmacist

## 2017-05-31 DIAGNOSIS — Z780 Asymptomatic menopausal state: Secondary | ICD-10-CM

## 2017-05-31 MED ORDER — GLUCOSE BLOOD VI STRP: ORAL_STRIP | 2 refills | Status: DC

## 2017-05-31 MED ORDER — CALCIUM CARBONATE-VITAMIN D 500-200 MG-UNIT PO TABS: 1.0000 | ORAL_TABLET | Freq: Every day | ORAL | 3 refills | Status: DC

## 2017-06-01 DIAGNOSIS — H35351 Cystoid macular degeneration, right eye: Secondary | ICD-10-CM | POA: Diagnosis not present

## 2017-06-04 ENCOUNTER — Ambulatory Visit: Payer: Medicare Other | Attending: Internal Medicine | Admitting: Internal Medicine

## 2017-06-04 ENCOUNTER — Encounter: Payer: Self-pay | Admitting: Internal Medicine

## 2017-06-04 VITALS — BP 138/73 | HR 83 | Temp 98.1°F | Resp 16 | Ht 64.0 in | Wt 223.2 lb

## 2017-06-04 DIAGNOSIS — I10 Essential (primary) hypertension: Secondary | ICD-10-CM

## 2017-06-04 DIAGNOSIS — Z780 Asymptomatic menopausal state: Secondary | ICD-10-CM | POA: Diagnosis not present

## 2017-06-04 DIAGNOSIS — Z6838 Body mass index (BMI) 38.0-38.9, adult: Secondary | ICD-10-CM | POA: Insufficient documentation

## 2017-06-04 DIAGNOSIS — Z86010 Personal history of colonic polyps: Secondary | ICD-10-CM | POA: Diagnosis not present

## 2017-06-04 DIAGNOSIS — Z90710 Acquired absence of both cervix and uterus: Secondary | ICD-10-CM | POA: Diagnosis not present

## 2017-06-04 DIAGNOSIS — K219 Gastro-esophageal reflux disease without esophagitis: Secondary | ICD-10-CM | POA: Insufficient documentation

## 2017-06-04 DIAGNOSIS — Z833 Family history of diabetes mellitus: Secondary | ICD-10-CM | POA: Insufficient documentation

## 2017-06-04 DIAGNOSIS — J4520 Mild intermittent asthma, uncomplicated: Secondary | ICD-10-CM | POA: Diagnosis not present

## 2017-06-04 DIAGNOSIS — Z7982 Long term (current) use of aspirin: Secondary | ICD-10-CM | POA: Diagnosis not present

## 2017-06-04 DIAGNOSIS — Z9841 Cataract extraction status, right eye: Secondary | ICD-10-CM | POA: Diagnosis not present

## 2017-06-04 DIAGNOSIS — I5032 Chronic diastolic (congestive) heart failure: Secondary | ICD-10-CM

## 2017-06-04 DIAGNOSIS — N182 Chronic kidney disease, stage 2 (mild): Secondary | ICD-10-CM | POA: Diagnosis not present

## 2017-06-04 DIAGNOSIS — I130 Hypertensive heart and chronic kidney disease with heart failure and stage 1 through stage 4 chronic kidney disease, or unspecified chronic kidney disease: Secondary | ICD-10-CM | POA: Insufficient documentation

## 2017-06-04 DIAGNOSIS — Z79899 Other long term (current) drug therapy: Secondary | ICD-10-CM | POA: Insufficient documentation

## 2017-06-04 DIAGNOSIS — Z800 Family history of malignant neoplasm of digestive organs: Secondary | ICD-10-CM | POA: Diagnosis not present

## 2017-06-04 DIAGNOSIS — M170 Bilateral primary osteoarthritis of knee: Secondary | ICD-10-CM | POA: Diagnosis not present

## 2017-06-04 DIAGNOSIS — E662 Morbid (severe) obesity with alveolar hypoventilation: Secondary | ICD-10-CM | POA: Diagnosis not present

## 2017-06-04 DIAGNOSIS — E1165 Type 2 diabetes mellitus with hyperglycemia: Secondary | ICD-10-CM

## 2017-06-04 DIAGNOSIS — E1122 Type 2 diabetes mellitus with diabetic chronic kidney disease: Secondary | ICD-10-CM | POA: Insufficient documentation

## 2017-06-04 DIAGNOSIS — Z794 Long term (current) use of insulin: Secondary | ICD-10-CM | POA: Diagnosis not present

## 2017-06-04 DIAGNOSIS — E785 Hyperlipidemia, unspecified: Secondary | ICD-10-CM | POA: Insufficient documentation

## 2017-06-04 DIAGNOSIS — N959 Unspecified menopausal and perimenopausal disorder: Secondary | ICD-10-CM | POA: Diagnosis not present

## 2017-06-04 DIAGNOSIS — Z9842 Cataract extraction status, left eye: Secondary | ICD-10-CM | POA: Insufficient documentation

## 2017-06-04 DIAGNOSIS — IMO0001 Reserved for inherently not codable concepts without codable children: Secondary | ICD-10-CM

## 2017-06-04 LAB — GLUCOSE, POCT (MANUAL RESULT ENTRY): POC Glucose: 90 mg/dl (ref 70–99)

## 2017-06-04 MED ORDER — INSULIN LISPRO PROT & LISPRO (75-25 MIX) 100 UNIT/ML ~~LOC~~ SUSP: SUBCUTANEOUS | 11 refills | Status: DC

## 2017-06-04 MED ORDER — FREESTYLE LANCETS MISC: 5 refills | Status: DC

## 2017-06-04 MED ORDER — CALCIUM CARBONATE-VITAMIN D 500-200 MG-UNIT PO TABS: 1.0000 | ORAL_TABLET | Freq: Every day | ORAL | 3 refills | Status: DC

## 2017-06-04 MED ORDER — ALBUTEROL SULFATE HFA 108 (90 BASE) MCG/ACT IN AERS: 2.0000 | INHALATION_SPRAY | Freq: Four times a day (QID) | RESPIRATORY_TRACT | 3 refills | Status: DC | PRN

## 2017-06-04 NOTE — Progress Notes (Signed)
Patient ID: Heidi [name], female    DOB: 26-Feb-1951  MRN: [id number]  CC: Follow-up   Subjective: Heidi [name] is a 66 y.o. female who presents for chronic ds management Her concerns today include:  66 year old female with history of HTN, CHF, migraines (followed by [facility] on Botox inj), obstructive sleep apnea on BiPAP, asthma, GERD, diabetes 2, CKD stage II, OA knees,  DDD of cervical spine (followed by Dr. Letta Pate and on Tramadol) obesity, HL,  1. DM:  -saw clinical pharmacist since last visit. BS had improved -BS: checks 3-4 x a day. Did not bring log. A.m BS usually in 200s -Med: on Humalog 70/30 - 60/40 but some days she has to give her self more.  Runs out of insulin; only gets 3 vials/mth Eating habits: doing better but feels she still need to improve on some things. Gets craving for donuts. -she stopped drinking ETOH totally  2. HTN/CHF: compliant with Lisinopril, Atenolol, HCTZ -limits salt  3. OSA:  Not using CPAP or Bipap x 2 yrs. -fear of catching bad lung infection if machine is not clean properly. Also she reports she does not have symptoms anymore States she does not snore. Lives with her daughter. Wakes up feeling refreshed. Last sleep study was in  [place] 3 yrs ago  Patient Active Problem List   Diagnosis Date Noted  . Torn left ear lobe 11/20/2016  . Rash 11/20/2016  . Chronic migraine w/o aura w/o status migrainosus, not intractable 06/27/2016  . Post-menopausal atrophic vaginitis 11/14/2015  . Pain, dental 07/23/2015  . Primary osteoarthritis of both knees 07/18/2015  . De Quervain's tenosynovitis, right 05/08/2015  . Chronic headache 05/08/2015  . GERD (gastroesophageal reflux disease) 05/08/2015  . Renal cyst, right 05/08/2015  . Adenomatous polyp of colon 03/26/2015  . HTN (hypertension) 02/12/2015  . Cognitive deficit due to old head trauma 02/12/2015  . Asthma, chronic 02/12/2015  . Chronic left shoulder pain 02/11/2015  .  Thyroid nodule 12/18/2014  . Chronic neck pain   . HLD (hyperlipidemia)   . OSA treated with BiPAP 11/27/2014  . Obesity hypoventilation syndrome ([place]) 11/27/2014  . Elevated troponin   . Demand ischemia ([place])   . Diabetes type 2, uncontrolled ([place])   . Cardiomegaly 11/25/2014  . CHF (congestive heart failure) ([place]) 11/25/2014  . DM type 2 causing CKD stage 2 ([place]) 11/25/2014     Current Outpatient Medications on File Prior to Visit  Medication Sig Dispense Refill  . albuterol (PROVENTIL) (2.5 MG/3ML) 0.083% nebulizer solution INHALE CONTENTS OF 1 VIAL VIA NEBULIZER EVERY 6 HOURS AS NEEDED FOR WHEEZING OR SHORTNESS OF BREATH 450 mL 1  . aspirin 81 MG chewable tablet Chew 81 mg by mouth daily.     Marland Kitchen atenolol (TENORMIN) 25 MG tablet Take 1 tablet (25 mg total) by mouth daily. 90 tablet 6  . atorvastatin (LIPITOR) 40 MG tablet TAKE 1 TABLET BY MOUTH DAILY AT 6 PM 90 tablet 0  . bumetanide (BUMEX) 0.5 MG tablet Take 1 tablet (0.5 mg total) by mouth daily. 90 tablet 3  . conjugated estrogens (PREMARIN) vaginal cream Insert 0.5 grams vaginally once a wk. 30 g 1  . dextromethorphan-guaiFENesin (MUCINEX DM) 30-600 MG 12hr tablet Take 1 tablet by mouth as needed for cough.    Marland Kitchen glucose blood (FREESTYLE TEST STRIPS) test strip Use as instructed for 3 times daily testing of blood glucose. 300 each 2  . hydrochlorothiazide (HYDRODIURIL) 25 MG tablet Take 1 tablet (25 mg  total) by mouth daily. 90 tablet 3  . INSULIN SYRINGE 1CC/29G 29G X 1/2" 1 ML MISC USE THREE TIMES DAILY AS DIRECTED 100 each 3  . Lancets (FREESTYLE) lancets 1 each by Other route 3 (three) times daily. Use as instructed 100 each 12  . lisinopril (PRINIVIL,ZESTRIL) 5 MG tablet Take 1 tablet (5 mg total) by mouth daily. 90 tablet 3  . montelukast (SINGULAIR) 10 MG tablet Take 1 tablet (10 mg total) by mouth daily. 90 tablet 1  . Multiple Vitamin (MULTIVITAMIN WITH MINERALS) TABS tablet Take 1 tablet by mouth daily.    . nitroGLYCERIN  (NITROSTAT) 0.3 MG SL tablet Place 1 tablet (0.3 mg total) under the tongue every 5 (five) minutes as needed for chest pain. Max 3 tablets in 15 minutes 90 tablet 0  . omeprazole (PRILOSEC) 40 MG capsule TAKE ONE CAPSULE BY MOUTH DAILY 90 capsule 0  . potassium chloride 20 MEQ TBCR Take 20 mEq by mouth daily. 30 tablet 5  . topiramate (TOPAMAX) 25 MG capsule Take 3 capsules (75 mg total) by mouth 2 (two) times daily. 180 capsule 11  . traMADol (ULTRAM) 50 MG tablet Take 1 tablet (50 mg total) by mouth every 8 (eight) hours as needed. 90 tablet 2  . Ascorbic Acid (VITAMIN C PO) Take 1 tablet by mouth daily.     Current Facility-Administered Medications on File Prior to Visit  Medication Dose Route Frequency Provider Last Rate Last Dose  . 0.9 %  sodium chloride infusion  500 mL Intravenous Continuous Irene Shipper, MD        No Known Allergies  Social History   Socioeconomic History  . Marital status: Legally Separated    Spouse name: Not on file  . Number of children: 4  . Years of education: Not on file  . Highest education level: Not on file  Social Needs  . Financial resource strain: Not on file  . Food insecurity - worry: Not on file  . Food insecurity - inability: Not on file  . Transportation needs - medical: Not on file  . Transportation needs - non-medical: Not on file  Occupational History  . Occupation: Disabled  Tobacco Use  . Smoking status: Never Smoker  . Smokeless tobacco: Never Used  Substance and Sexual Activity  . Alcohol use: No  . Drug use: No  . Sexual activity: Not on file  Other Topics Concern  . Not on file  Social History Narrative   Lives with daughter   Caffeine use: 1 cup coffee per day    Family History  Problem Relation Age of Onset  . Colon cancer Father   . Diabetes type II Sister   . Diabetes type II Brother   . Colon cancer Sister   . Migraines Neg Hx     Past Surgical History:  Procedure Laterality Date  . ABDOMINAL  HYSTERECTOMY    . CATARACT EXTRACTION Left   . CATARACT EXTRACTION     LT 01/2017, 02/2017 on RT  . CESAREAN SECTION      ROS: Review of Systems GYN: continues to use estrogen vaginal cream. We dec to once a wk. Reports she gets bad hot flashes without it  PHYSICAL EXAM: BP 138/73 (BP Location: Right Arm, Patient Position: Sitting, Cuff Size: Normal)   Pulse 83   Temp 98.1 F (36.7 C) (Oral)   Resp 16   Ht 5\' 4"  (1.626 m)   Wt 223 lb 3.2 oz (101.2 kg)  SpO2 96%   BMI 38.31 kg/m   Physical Exam General appearance - alert, well appearing, and in no distress Mental status - alert, oriented to person, place, and time, normal mood, behavior, speech, dress, motor activity, and thought processes Neck - supple, no significant adenopathy Chest - clear to auscultation, no wheezes, rales or rhonchi, symmetric air entry Heart - normal rate, regular rhythm, normal S1, S2, no murmurs, rubs, clicks or gallops Extremities - peripheral pulses normal, no pedal edema, no clubbing or cyanosis   Results for orders placed or performed in visit on 06/04/17  Potassium  Result Value Ref Range   Potassium 4.0 3.5 - 5.2 mmol/L  Hemoglobin A1c  Result Value Ref Range   Hgb A1c MFr Bld 11.0 (H) 4.8 - 5.6 %   Est. average glucose Bld gHb Est-mCnc 269 mg/dL  Glucose (CBG)  Result Value Ref Range   POC Glucose 90 70 - 99 mg/dl   BS 90  ASSESSMENT AND PLAN: 1. Diabetes mellitus type 2, uncontrolled, without complications ([place]) Inc evening dose insulin to 45 units. Will request that she get 4 vials insulin per mth Continue to monitor BS and RECORD READINGS. Will have her see clin pharm in 1 mth for further titration Discuss healthy eating habits - Glucose (CBG) - Lancets (FREESTYLE) lancets; USE AS DIRECTED  Dispense: 100 each; Refill: 5 - Hemoglobin A1c - insulin lispro protamine-lispro (HUMALOG 75/25 MIX) (75-25) 100 UNIT/ML SUSP injection; Inject subcutaneously 60 U in the morning with food and  45 U at night  Dispense: 40 mL; Refill: 11  2. Asthma, intermittent -requested RF on ALbuterol - albuterol (PROVENTIL HFA;VENTOLIN HFA) 108 (90 Base) MCG/ACT inhaler; Inhale 2 puffs into the lungs every 6 (six) hours as needed for wheezing or shortness of breath.  Dispense: 1 Inhaler; Refill: 3  3. Post-menopausal Requested RF on Ca/Vit D She is at the age where we should be able to stop the HRT but pt is adamant that she needs it - calcium-vitamin D (OSCAL WITH D) 500-200 MG-UNIT tablet; Take 1 tablet by mouth daily.  Dispense: 30 tablet; Refill: 3  4. CKD (chronic kidney disease) stage 2, GFR 60-89 ml/min  5. Essential hypertension Stable. Continue current meds - Potassium  6. Chronic diastolic congestive heart failure (HCC) stable  7. OSA: can not convince her to use CPAP  Patient was given the opportunity to ask questions.  Patient verbalized understanding of the plan and was able to repeat key elements of the plan.   Orders Placed This Encounter  Procedures  . Potassium  . Hemoglobin A1c  . Glucose (CBG)     Requested Prescriptions   Signed Prescriptions Disp Refills  . Lancets (FREESTYLE) lancets 100 each 5    Sig: USE AS DIRECTED  . albuterol (PROVENTIL HFA;VENTOLIN HFA) 108 (90 Base) MCG/ACT inhaler 1 Inhaler 3    Sig: Inhale 2 puffs into the lungs every 6 (six) hours as needed for wheezing or shortness of breath.  . calcium-vitamin D (OSCAL WITH D) 500-200 MG-UNIT tablet 30 tablet 3    Sig: Take 1 tablet by mouth daily.  . insulin lispro protamine-lispro (HUMALOG 75/25 MIX) (75-25) 100 UNIT/ML SUSP injection 40 mL 11    Sig: Inject subcutaneously 60 U in the morning with food and 45 U at night    Return in about 3 months (around 09/02/2017).  Karle Plumber, MD, FACP

## 2017-06-04 NOTE — Progress Notes (Signed)
Patient is here for a follow up. Patient stated that she would like to know her kidney and cholesterol levels.

## 2017-06-04 NOTE — Patient Instructions (Signed)
Give apptt with Stacy in 1 mth for recheck of blood sugars.

## 2017-06-05 LAB — HEMOGLOBIN A1C
Est. average glucose Bld gHb Est-mCnc: 269 mg/dL
HEMOGLOBIN A1C: 11 % — AB (ref 4.8–5.6)

## 2017-06-05 LAB — POTASSIUM: POTASSIUM: 4 mmol/L (ref 3.5–5.2)

## 2017-06-07 ENCOUNTER — Telehealth: Payer: Self-pay

## 2017-06-07 ENCOUNTER — Ambulatory Visit: Payer: Medicare Other | Admitting: Neurology

## 2017-06-07 NOTE — Telephone Encounter (Signed)
Called patient back to inform above. Patient will check with the pharmacy.

## 2017-06-07 NOTE — Telephone Encounter (Signed)
-----   Message from Ladell Pier, MD sent at 06/05/2017  9:48 PM EST ----- Let pt know that her potassium level is normal.  Her A1C, the 3 mth level for her diabetes is 11. Goal is to be less than 7. This indicates that her diabetes is not under good control. Please be mindful of eating habits, and take insulin twice a day as prescribe. Please keep a good log of blood sugar readings and bring with her on her visit in January.

## 2017-06-07 NOTE — Telephone Encounter (Signed)
Patient inform on lab result.  Patient verified DOB.

## 2017-06-08 ENCOUNTER — Telehealth: Payer: Self-pay | Admitting: Internal Medicine

## 2017-06-08 DIAGNOSIS — IMO0001 Reserved for inherently not codable concepts without codable children: Secondary | ICD-10-CM

## 2017-06-08 DIAGNOSIS — E1165 Type 2 diabetes mellitus with hyperglycemia: Principal | ICD-10-CM

## 2017-06-08 MED ORDER — FREESTYLE LANCETS MISC: 5 refills | Status: DC

## 2017-06-08 NOTE — Telephone Encounter (Signed)
Patient needs lancets to please send the prescription to [facility]

## 2017-06-08 NOTE — Addendum Note (Signed)
Addended by: Rica Mast on: 06/08/2017 04:26 PM   Modules accepted: Orders

## 2017-06-08 NOTE — Telephone Encounter (Signed)
Resent lancets

## 2017-06-09 ENCOUNTER — Encounter: Payer: Self-pay | Admitting: Neurology

## 2017-06-09 ENCOUNTER — Telehealth: Payer: Self-pay | Admitting: Neurology

## 2017-06-09 ENCOUNTER — Ambulatory Visit (INDEPENDENT_AMBULATORY_CARE_PROVIDER_SITE_OTHER): Payer: Medicare Other | Admitting: Neurology

## 2017-06-09 VITALS — BP 127/76 | HR 78

## 2017-06-09 DIAGNOSIS — G43709 Chronic migraine without aura, not intractable, without status migrainosus: Secondary | ICD-10-CM | POA: Diagnosis not present

## 2017-06-09 NOTE — Telephone Encounter (Signed)
Pt. Needs botox.

## 2017-06-09 NOTE — Progress Notes (Signed)
Botox-100units x 2 vials Lot: [id number] Expiration: 12/2019 NDC: 0175-1025-85  Bacteriostatic 0.9% Sodium Chloride- 42mL total Lot: [id number] Expiration: 11/04/2018 NDC: 2353-6144-31 Dx: V40.086 B/B  //BCrn

## 2017-06-09 NOTE — Progress Notes (Signed)

## 2017-06-10 NOTE — Telephone Encounter (Signed)
I called and scheduled the patient for her next injection.  °

## 2017-06-11 ENCOUNTER — Telehealth: Payer: Self-pay | Admitting: Pharmacist

## 2017-06-11 NOTE — Telephone Encounter (Signed)
Received call from [facility] requesting Diabetic Order Form to be signed by PCP and faxed back. [place] had the form so I got it from her and faxed it to [employer].

## 2017-06-16 ENCOUNTER — Other Ambulatory Visit: Payer: Self-pay | Admitting: Pharmacist

## 2017-06-16 DIAGNOSIS — I5033 Acute on chronic diastolic (congestive) heart failure: Secondary | ICD-10-CM

## 2017-06-16 DIAGNOSIS — I10 Essential (primary) hypertension: Secondary | ICD-10-CM

## 2017-06-16 MED ORDER — POTASSIUM CHLORIDE ER 20 MEQ PO TBCR: 20.0000 meq | EXTENDED_RELEASE_TABLET | Freq: Every day | ORAL | 0 refills | Status: DC

## 2017-07-08 ENCOUNTER — Ambulatory Visit: Payer: Medicare Other | Admitting: Pharmacist

## 2017-07-08 NOTE — Progress Notes (Deleted)
    S:     No chief complaint on file.   Patient arrives in good spirits.  Presents for diabetes evaluation, education, and management at the request of Dr. Wynetta Emery. Patient was referred on 02/26/17.  Patient was last seen by Primary Care Provider on 02/26/17.   Patient reports adherence with medications.  Current diabetes medications include: Humalog 75/25 60 units in the morning and 45 units in the evening.:   Patient reports hypoglycemic events. She only had one reading in the 70s and this was on a day when she didn't eat as much. She treats her hypoglycemia with butterscotch candy. She is very concerned about hypoglycemia.  Patient reported dietary habits: Eats 3 meals/day. She has been working hard on her diet to cut out sugar and foods with a lot of carbs. Breakfast: fruits and toast Lunch:salad Dinner: baked meats and salad Snacks: none Drinks:water, prune juice   Patient reported exercise habits: none   Patient denies nocturia.  Patient denies neuropathy. Patient denies visual changes. Patient reports self foot exams.    O:  Physical Exam   ROS   Lab Results  Component Value Date   HGBA1C 11.0 (H) 06/04/2017   There were no vitals filed for this visit.  Home fasting CBG: 100s-120s, 140 2 hour post-prandial/random CBG: 80s-110s in morning, 75, 110s-150s before dinner   A/P: Diabetes longstanding currently uncontrolled based on A1c of 11 (up from 10). Patient reports hypoglycemic events in the 70s and is able to verbalize appropriate hypoglycemia management plan. Patient reports adherence with medication.   Continue current medications as prescribed. Discussed decreasing her morning dose of insulin but patient did not want to. She is very happy with her current dose and her control of her blood sugars and feels that the readings in the 70s was due to not eating enough. Patient encouraged to continue to eat 3 fulls meals a day.  Next A1C anticipated November  2018.     Written patient instructions provided.  Total time in face to face counseling 20 minutes.   Follow up in Pharmacist Clinic Visit PRN, next visit in 2 weeks with Dr. Wynetta Emery.   Patient seen with Reino Bellis, PharmD candidate.

## 2017-07-12 ENCOUNTER — Encounter (HOSPITAL_COMMUNITY): Payer: Self-pay | Admitting: Emergency Medicine

## 2017-07-12 ENCOUNTER — Ambulatory Visit (HOSPITAL_COMMUNITY)
Admission: EM | Admit: 2017-07-12 | Discharge: 2017-07-12 | Disposition: A | Discharge status: Home / Self Care | Payer: Medicare Other | Attending: Emergency Medicine | Admitting: Emergency Medicine

## 2017-07-12 DIAGNOSIS — J321 Chronic frontal sinusitis: Secondary | ICD-10-CM

## 2017-07-12 DIAGNOSIS — R110 Nausea: Secondary | ICD-10-CM | POA: Diagnosis not present

## 2017-07-12 LAB — GLUCOSE, CAPILLARY: Glucose-Capillary: 183 mg/dL — ABNORMAL HIGH (ref 65–99)

## 2017-07-12 MED ORDER — AZITHROMYCIN 250 MG PO TABS: 250.0000 mg | ORAL_TABLET | Freq: Every day | ORAL | 0 refills | Status: DC

## 2017-07-12 MED ORDER — ONDANSETRON HCL 4 MG PO TABS: 4.0000 mg | ORAL_TABLET | Freq: Four times a day (QID) | ORAL | 0 refills | Status: DC

## 2017-07-12 NOTE — ED Triage Notes (Signed)
PT reports dizziness for a "few days" PT reports a lightheaded sensation. PT reports nausea that started today. PT reports her normal BP is 130s/70s and today she is 115/58. PT was checking BP with personal monitory in waiting room

## 2017-07-12 NOTE — Discharge Instructions (Signed)
May use a nasal spray  Avoid any decongestants  Stay hydrated  May use a humidifiers as needed

## 2017-07-12 NOTE — ED Provider Notes (Signed)
[place]    CSN: [id number] Arrival date & time: 07/12/17  1534     History   Chief Complaint Chief Complaint  Patient presents with  . Nausea  . Dizziness    HPI Heidi [name] is a 67 y.o. female.   Pt is here for facial pain, runny nose, dizziness after taking sinus medications. Pt states that has been going on for 1 week and just not getting better. Has taking otc sinus meds with minimal relief. Alert x4       Past Medical History:  Diagnosis Date  . Adenomatous colon polyp   . Anemia   . Anxiety   . Asthma   . CHF (congestive heart failure) ([place])   . Diabetes mellitus without complication ([place])   . High cholesterol   . Hypertension     Patient Active Problem List   Diagnosis Date Noted  . Torn left ear lobe 11/20/2016  . Chronic migraine w/o aura w/o status migrainosus, not intractable 06/27/2016  . Post-menopausal atrophic vaginitis 11/14/2015  . Primary osteoarthritis of both knees 07/18/2015  . De Quervain's tenosynovitis, right 05/08/2015  . GERD (gastroesophageal reflux disease) 05/08/2015  . Renal cyst, right 05/08/2015  . Adenomatous polyp of colon 03/26/2015  . HTN (hypertension) 02/12/2015  . Cognitive deficit due to old head trauma 02/12/2015  . Asthma, chronic 02/12/2015  . Chronic left shoulder pain 02/11/2015  . Thyroid nodule 12/18/2014  . Chronic neck pain   . HLD (hyperlipidemia)   . OSA treated with BiPAP 11/27/2014  . Obesity hypoventilation syndrome ([place]) 11/27/2014  . Demand ischemia ([place])   . Diabetes type 2, uncontrolled ([place])   . Cardiomegaly 11/25/2014  . CHF (congestive heart failure) ([place]) 11/25/2014  . 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 ABDOMINAL HYSTERECTOMY    . CATARACT EXTRACTION Left   . CATARACT EXTRACTION     LT 01/2017, 02/2017 on RT  . CESAREAN SECTION      OB History    No data available       Home Medications    Prior  to Admission medications   Medication Sig Start Date End Date Taking? Authorizing Provider  albuterol (PROVENTIL HFA;VENTOLIN HFA) 108 (90 Base) MCG/ACT inhaler Inhale 2 puffs into the lungs every 6 (six) hours as needed for wheezing or shortness of breath. 06/04/17  Yes Ladell Pier, MD  aspirin 81 MG chewable tablet Chew 81 mg by mouth daily.    Yes [provider]  atenolol (TENORMIN) 25 MG tablet Take 1 tablet (25 mg total) by mouth daily. 02/26/17  Yes Ladell Pier, MD  atorvastatin (LIPITOR) 40 MG tablet TAKE 1 TABLET BY MOUTH DAILY AT 6 PM 02/26/17  Yes Ladell Pier, MD  bumetanide (BUMEX) 0.5 MG tablet Take 1 tablet (0.5 mg total) by mouth daily. 02/26/17  Yes Ladell Pier, MD  calcium-vitamin D (OSCAL WITH D) 500-200 MG-UNIT tablet Take 1 tablet by mouth daily. 06/04/17  Yes Ladell Pier, MD  conjugated estrogens (PREMARIN) vaginal cream Insert 0.5 grams vaginally once a wk. 05/19/17  Yes Ladell Pier, MD  dextromethorphan-guaiFENesin [facility] DM) 30-600 MG 12hr tablet Take 1 tablet by mouth as needed for cough.   Yes [provider]  hydrochlorothiazide (HYDRODIURIL) 25 MG tablet Take 1 tablet (25 mg total) by mouth daily. 02/26/17  Yes Ladell Pier, MD  insulin lispro protamine-lispro (HUMALOG 75/25  MIX) (75-25) 100 UNIT/ML SUSP injection Inject subcutaneously 60 U in the morning with food and 45 U at night 06/04/17  Yes Ladell Pier, MD  lisinopril (PRINIVIL,ZESTRIL) 5 MG tablet Take 1 tablet (5 mg total) by mouth daily. 05/12/16  Yes Funches, Josalyn, MD  montelukast (SINGULAIR) 10 MG tablet Take 1 tablet (10 mg total) by mouth daily. 02/26/17  Yes Ladell Pier, MD  Multiple Vitamin (MULTIVITAMIN WITH MINERALS) TABS tablet Take 1 tablet by mouth daily.   Yes [provider]  omeprazole (PRILOSEC) 40 MG capsule TAKE ONE CAPSULE BY MOUTH DAILY 05/31/17  Yes Ladell Pier, MD  Potassium Chloride ER 20 MEQ TBCR  Take 20 mEq by mouth daily. 06/16/17  Yes Ladell Pier, MD  topiramate (TOPAMAX) 25 MG capsule Take 3 capsules (75 mg total) by mouth 2 (two) times daily. 01/12/17  Yes Melvenia Beam, MD  traMADol (ULTRAM) 50 MG tablet Take 1 tablet (50 mg total) by mouth every 8 (eight) hours as needed. 02/19/17  Yes Danella Sensing L, NP  albuterol (PROVENTIL) (2.5 MG/3ML) 0.083% nebulizer solution INHALE CONTENTS OF 1 VIAL VIA NEBULIZER EVERY 6 HOURS AS NEEDED FOR WHEEZING OR SHORTNESS OF BREATH 07/10/15   Funches, Adriana Mccallum, MD  Ascorbic Acid (VITAMIN C PO) Take 1 tablet by mouth daily.    [provider]  azithromycin (ZITHROMAX) 250 MG tablet Take 1 tablet (250 mg total) by mouth daily. Take first 2 tablets together, then 1 every day until finished. 07/12/17   Marney Setting, NP  glucose blood (FREESTYLE TEST STRIPS) test strip Use as instructed for 3 times daily testing of blood glucose. 05/31/17   Ladell Pier, MD  INSULIN SYRINGE 1CC/29G 29G X 1/2" 1 ML MISC USE THREE TIMES DAILY AS DIRECTED 03/16/17   Ladell Pier, MD  Lancets (FREESTYLE) lancets Use as directed 3 times daily. E11.9 06/08/17   Ladell Pier, MD  nitroGLYCERIN (NITROSTAT) 0.3 MG SL tablet Place 1 tablet (0.3 mg total) under the tongue every 5 (five) minutes as needed for chest pain. Max 3 tablets in 15 minutes 08/07/16   Funches, Adriana Mccallum, MD  ondansetron (ZOFRAN) 4 MG tablet Take 1 tablet (4 mg total) by mouth every 6 (six) hours. 07/12/17   Marney Setting, NP    Family History Family History  Problem Relation Age of Onset  . Colon cancer Father   . Diabetes type II Sister   . Diabetes type II Brother   . Colon cancer Sister   . Migraines Neg Hx     Social History Social History   Tobacco Use  . Smoking status: Never Smoker  . Smokeless tobacco: Never Used  Substance Use Topics  . Alcohol use: No  . Drug use: No     Allergies   Patient has no known allergies.   Review of Systems Review of  Systems  Constitutional: Positive for fatigue.  HENT: Positive for congestion, postnasal drip, rhinorrhea, sinus pressure and sinus pain.   Eyes: Negative.   Respiratory: Negative.   Cardiovascular: Negative.   Skin: Negative.   Neurological: Positive for dizziness.     Physical Exam Triage Vital Signs ED Triage Vitals  Enc Vitals Group     BP 07/12/17 1610 (!) 115/58     Pulse Rate 07/12/17 1610 84     Resp 07/12/17 1610 16     Temp 07/12/17 1610 98.3 F (36.8 C)     Temp Source 07/12/17 1610 Oral  SpO2 07/12/17 1610 97 %     Weight 07/12/17 1611 223 lb (101.2 kg)     Height 07/12/17 1611 5\' 4"  (1.626 m)     Head Circumference --      Peak Flow --      Pain Score 07/12/17 1612 8     Pain Loc --      Pain Edu? --      Excl. in [place]? --    No data found.  Updated Vital Signs BP (!) 115/58   Pulse 84   Temp 98.3 F (36.8 C) (Oral)   Resp 16   Ht 5\' 4"  (1.626 m)   Wt 223 lb (101.2 kg)   SpO2 97%   BMI 38.28 kg/m   Visual Acuity Right Eye Distance:   Left Eye Distance:   Bilateral Distance:    Right Eye Near:   Left Eye Near:    Bilateral Near:     Physical Exam  Constitutional: She appears well-developed.  HENT:  Head: Normocephalic.  Right Ear: External ear normal.  Left Ear: External ear normal.  Mouth/Throat: Oropharynx is clear and moist.  Thick green post nasal drip,   Eyes: Pupils are equal, round, and reactive to light.  Neck: Normal range of motion.  Cardiovascular: Normal rate and regular rhythm.  Pulmonary/Chest: Effort normal and breath sounds normal.  Abdominal: Soft. Bowel sounds are normal.  Musculoskeletal: Normal range of motion.  Neurological: She is alert.  Walking, no deviation, no facial droop,   Skin: Skin is warm. Capillary refill takes less than 2 seconds.     UC Treatments / Results  Labs (all labs ordered are listed, but only abnormal results are displayed) Labs Reviewed  GLUCOSE, CAPILLARY - Abnormal; Notable for  the following components:      Result Value   Glucose-Capillary 183 (*)    All other components within normal limits    EKG  EKG Interpretation None       Radiology No results found.  Procedures Procedures (including critical care time)  Medications Ordered in UC Medications - No data to display   Initial Impression / Assessment and Plan / UC Course  I have reviewed the triage vital signs and the nursing notes.  Pertinent labs & imaging results that were available during my care of the patient were reviewed by me and considered in my medical decision making (see chart for details).     May use a nasal spray  Avoid any decongestants  Stay hydrated  May use a humidifiers as needed   Final Clinical Impressions(s) / UC Diagnoses   Final diagnoses:  Nausea  Chronic frontal sinusitis    ED Discharge Orders        Ordered    azithromycin (ZITHROMAX) 250 MG tablet  Daily     07/12/17 1705    ondansetron (ZOFRAN) 4 MG tablet  Every 6 hours     07/12/17 1705       Controlled Substance Prescriptions [place] Controlled Substance Registry consulted? Not Applicable   Marney Setting, NP 07/12/17 [phone]

## 2017-07-13 ENCOUNTER — Other Ambulatory Visit: Payer: Self-pay | Admitting: Internal Medicine

## 2017-07-20 ENCOUNTER — Other Ambulatory Visit: Payer: Self-pay | Admitting: Internal Medicine

## 2017-07-28 ENCOUNTER — Other Ambulatory Visit: Payer: Self-pay | Admitting: Internal Medicine

## 2017-07-28 DIAGNOSIS — K219 Gastro-esophageal reflux disease without esophagitis: Secondary | ICD-10-CM

## 2017-08-09 ENCOUNTER — Encounter (HOSPITAL_COMMUNITY): Payer: Self-pay | Admitting: Emergency Medicine

## 2017-08-09 ENCOUNTER — Emergency Department (HOSPITAL_COMMUNITY)
Admission: EM | Admit: 2017-08-09 | Discharge: 2017-08-09 | Disposition: A | Discharge status: Home / Self Care | Payer: Medicare Other | Attending: Emergency Medicine | Admitting: Emergency Medicine

## 2017-08-09 ENCOUNTER — Emergency Department (HOSPITAL_COMMUNITY): Payer: Medicare Other

## 2017-08-09 ENCOUNTER — Other Ambulatory Visit: Payer: Self-pay

## 2017-08-09 DIAGNOSIS — J029 Acute pharyngitis, unspecified: Secondary | ICD-10-CM | POA: Insufficient documentation

## 2017-08-09 DIAGNOSIS — J45909 Unspecified asthma, uncomplicated: Secondary | ICD-10-CM | POA: Diagnosis not present

## 2017-08-09 DIAGNOSIS — M25462 Effusion, left knee: Secondary | ICD-10-CM | POA: Diagnosis not present

## 2017-08-09 DIAGNOSIS — Z7982 Long term (current) use of aspirin: Secondary | ICD-10-CM | POA: Insufficient documentation

## 2017-08-09 DIAGNOSIS — R05 Cough: Secondary | ICD-10-CM | POA: Insufficient documentation

## 2017-08-09 DIAGNOSIS — B9789 Other viral agents as the cause of diseases classified elsewhere: Secondary | ICD-10-CM | POA: Insufficient documentation

## 2017-08-09 DIAGNOSIS — Z79899 Other long term (current) drug therapy: Secondary | ICD-10-CM | POA: Diagnosis not present

## 2017-08-09 DIAGNOSIS — M25562 Pain in left knee: Secondary | ICD-10-CM

## 2017-08-09 DIAGNOSIS — R0602 Shortness of breath: Secondary | ICD-10-CM | POA: Diagnosis not present

## 2017-08-09 DIAGNOSIS — R079 Chest pain, unspecified: Secondary | ICD-10-CM | POA: Diagnosis not present

## 2017-08-09 DIAGNOSIS — I110 Hypertensive heart disease with heart failure: Secondary | ICD-10-CM | POA: Diagnosis not present

## 2017-08-09 DIAGNOSIS — I509 Heart failure, unspecified: Secondary | ICD-10-CM | POA: Insufficient documentation

## 2017-08-09 DIAGNOSIS — Z794 Long term (current) use of insulin: Secondary | ICD-10-CM | POA: Insufficient documentation

## 2017-08-09 DIAGNOSIS — E119 Type 2 diabetes mellitus without complications: Secondary | ICD-10-CM | POA: Diagnosis not present

## 2017-08-09 DIAGNOSIS — J069 Acute upper respiratory infection, unspecified: Secondary | ICD-10-CM

## 2017-08-09 DIAGNOSIS — R0981 Nasal congestion: Secondary | ICD-10-CM | POA: Diagnosis not present

## 2017-08-09 MED ORDER — OXYCODONE-ACETAMINOPHEN 5-325 MG PO TABS
1.0000 | ORAL_TABLET | Freq: Once | ORAL | Status: AC
  Administered 2017-08-09: 1 via ORAL
  Filled 2017-08-09: qty 1

## 2017-08-09 MED ORDER — BENZONATATE 100 MG PO CAPS: 100.0000 mg | ORAL_CAPSULE | Freq: Three times a day (TID) | ORAL | 0 refills | Status: DC

## 2017-08-09 MED ORDER — GUAIFENESIN-CODEINE 100-10 MG/5ML PO SYRP: 5.0000 mL | ORAL_SOLUTION | Freq: Three times a day (TID) | ORAL | 0 refills | Status: DC | PRN

## 2017-08-09 NOTE — ED Provider Notes (Signed)
[facility] EMERGENCY DEPARTMENT Provider Note   CSN: [id number] Arrival date & time: 08/09/17  0532     History   Chief Complaint Chief Complaint  Patient presents with  . Knee Pain  . URI    HPI Heidi [name] is a 67 y.o. female.  HPI   Heidi [name] is a 67 year old female with a history of CHF (EF 65-70%), insulin-dependent type 2 diabetes, hyperlipidemia, hypertension, obesity, anxiety who presents to the emergency department with multiple complaints.  Patient states that she has had left knee pain for the past 2 days now.  Denies inciting injury.  Reports that pain is located grossly over the knee joint without specific point tenderness.  Pain is severe and described as "dull and aching."  The pain radiates to the posterior thigh at times.  She states that pain is worsened after walking or being on her feet for long periods. States that it sometimes feels as if her knee is "locked up."  She has been taking tramadol that she has at home for her back for her knee pain without significant relief.  Denies fever, chills, joint swelling/redness, wound.  Is able to ambulate independently without difficulty.  Patient also states that she has had a cough, congestion and scratchy sore throat which started yesterday.  States that her cough is productive of a white mucus.  She occasionally has wheezing with cough.  Denies shortness of breath or chest pain.  States that her sore throat is mild.  Denies fevers, chills, ear pain, body aches, abdominal pain, nausea/vomiting, dysphagia, trismus.  Has a daughter at home with similar symptoms.  Past Medical History:  Diagnosis Date  . Adenomatous colon polyp   . Anemia   . Anxiety   . Asthma   . CHF (congestive heart failure) ([place])   . Diabetes mellitus without complication ([place])   . High cholesterol   . Hypertension     Patient Active Problem List   Diagnosis Date Noted  . Torn left ear lobe 11/20/2016  . Chronic migraine  w/o aura w/o status migrainosus, not intractable 06/27/2016  . Post-menopausal atrophic vaginitis 11/14/2015  . Primary osteoarthritis of both knees 07/18/2015  . De Quervain's tenosynovitis, right 05/08/2015  . GERD (gastroesophageal reflux disease) 05/08/2015  . Renal cyst, right 05/08/2015  . Adenomatous polyp of colon 03/26/2015  . HTN (hypertension) 02/12/2015  . Cognitive deficit due to old head trauma 02/12/2015  . Asthma, chronic 02/12/2015  . Chronic left shoulder pain 02/11/2015  . Thyroid nodule 12/18/2014  . Chronic neck pain   . HLD (hyperlipidemia)   . OSA treated with BiPAP 11/27/2014  . Obesity hypoventilation syndrome ([place]) 11/27/2014  . Demand ischemia ([place])   . Diabetes type 2, uncontrolled ([place])   . Cardiomegaly 11/25/2014  . CHF (congestive heart failure) ([place]) 11/25/2014  . DM type 2 causing CKD stage 2 ([place]) 11/25/2014    Past Surgical History:  Procedure Laterality Date  . ABDOMINAL HYSTERECTOMY    . CATARACT EXTRACTION Left   . CATARACT EXTRACTION     LT 01/2017, 02/2017 on RT  . CESAREAN SECTION      OB History    No data available       Home Medications    Prior to Admission medications   Medication Sig Start Date End Date Taking? Authorizing Provider  albuterol (PROVENTIL HFA;VENTOLIN HFA) 108 (90 Base) MCG/ACT inhaler Inhale 2 puffs into the lungs every 6 (six) hours as needed for wheezing or  shortness of breath. 06/04/17   Ladell Pier, MD  albuterol (PROVENTIL) (2.5 MG/3ML) 0.083% nebulizer solution INHALE CONTENTS OF 1 VIAL VIA NEBULIZER EVERY 6 HOURS AS NEEDED FOR WHEEZING OR SHORTNESS OF BREATH 07/10/15   Funches, Adriana Mccallum, MD  Ascorbic Acid (VITAMIN C PO) Take 1 tablet by mouth daily.    [provider]  aspirin 81 MG chewable tablet Chew 81 mg by mouth daily.     [provider]  atenolol (TENORMIN) 25 MG tablet Take 1 tablet (25 mg total) by mouth daily. 02/26/17   Ladell Pier, MD  atorvastatin (LIPITOR) 40  MG tablet TAKE 1 TABLET BY MOUTH DAILY AT 6 PM 07/13/17   Ladell Pier, MD  azithromycin (ZITHROMAX) 250 MG tablet Take 1 tablet (250 mg total) by mouth daily. Take first 2 tablets together, then 1 every day until finished. 07/12/17   Marney Setting, NP  bumetanide (BUMEX) 0.5 MG tablet Take 1 tablet (0.5 mg total) by mouth daily. 02/26/17   Ladell Pier, MD  calcium-vitamin D (OSCAL WITH D) 500-200 MG-UNIT tablet Take 1 tablet by mouth daily. 06/04/17   Ladell Pier, MD  conjugated estrogens (PREMARIN) vaginal cream Insert 0.5 grams vaginally once a wk. 05/19/17   Ladell Pier, MD  dextromethorphan-guaiFENesin [facility] DM) 30-600 MG 12hr tablet Take 1 tablet by mouth as needed for cough.    [provider]  glucose blood (FREESTYLE TEST STRIPS) test strip Use as instructed for 3 times daily testing of blood glucose. 05/31/17   Ladell Pier, MD  hydrochlorothiazide (HYDRODIURIL) 25 MG tablet Take 1 tablet (25 mg total) by mouth daily. 02/26/17   Ladell Pier, MD  insulin lispro protamine-lispro (HUMALOG 75/25 MIX) (75-25) 100 UNIT/ML SUSP injection Inject subcutaneously 60 U in the morning with food and 45 U at night 06/04/17   Ladell Pier, MD  INSULIN SYRINGE 1CC/29G 29G X 1/2" 1 ML MISC USE THREE TIMES DAILY AS DIRECTED 03/16/17   Ladell Pier, MD  Lancets (FREESTYLE) lancets Use as directed 3 times daily. E11.9 06/08/17   Ladell Pier, MD  lisinopril (PRINIVIL,ZESTRIL) 5 MG tablet Take 1 tablet (5 mg total) by mouth daily. 05/12/16   Funches, Adriana Mccallum, MD  montelukast (SINGULAIR) 10 MG tablet Take 1 tablet (10 mg total) by mouth daily. 02/26/17   Ladell Pier, MD  Multiple Vitamin (MULTIVITAMIN WITH MINERALS) TABS tablet Take 1 tablet by mouth daily.    [provider]  nitroGLYCERIN (NITROSTAT) 0.3 MG SL tablet Place 1 tablet (0.3 mg total) under the tongue every 5 (five) minutes as needed for chest pain. Max 3 tablets in 15  minutes 08/07/16   Boykin Nearing, MD  omeprazole (PRILOSEC) 40 MG capsule TAKE ONE CAPSULE BY MOUTH DAILY 07/28/17   Ladell Pier, MD  ondansetron (ZOFRAN) 4 MG tablet Take 1 tablet (4 mg total) by mouth every 6 (six) hours. 07/12/17   Marney Setting, NP  Potassium Chloride ER 20 MEQ TBCR Take 20 mEq by mouth daily. 06/16/17   Ladell Pier, MD  topiramate (TOPAMAX) 25 MG capsule Take 3 capsules (75 mg total) by mouth 2 (two) times daily. 01/12/17   Melvenia Beam, MD  traMADol (ULTRAM) 50 MG tablet Take 1 tablet (50 mg total) by mouth every 8 (eight) hours as needed. 02/19/17   Bayard Hugger, NP    Family History Family History  Problem Relation Age of Onset  . Colon cancer  Father   . Diabetes type II Sister   . Diabetes type II Brother   . Colon cancer Sister   . Migraines Neg Hx     Social History Social History   Tobacco Use  . Smoking status: Never Smoker  . Smokeless tobacco: Never Used  Substance Use Topics  . Alcohol use: No  . Drug use: No     Allergies   Patient has no known allergies.   Review of Systems Review of Systems  Constitutional: Negative for chills and fever.  HENT: Positive for congestion and sore throat. Negative for ear pain and trouble swallowing.   Respiratory: Positive for cough. Negative for shortness of breath and wheezing.   Cardiovascular: Negative for chest pain.  Gastrointestinal: Negative for abdominal pain, nausea and vomiting.  Musculoskeletal: Positive for arthralgias (left knee). Negative for gait problem, joint swelling and myalgias.  Skin: Negative for color change and rash.  Neurological: Negative for headaches.     Physical Exam Updated Vital Signs BP 136/64 (BP Location: Right Arm)   Pulse 92   Temp 98.8 F (37.1 C) (Oral)   Resp 18   Ht 5\' 4"  (1.626 m)   Wt 97.5 kg (215 lb)   SpO2 95%   BMI 36.90 kg/m   Physical Exam  Constitutional: She is oriented to person, place, and time. She appears  well-developed and well-nourished. No distress.  HENT:  Head: Normocephalic and atraumatic.  Membranes moist.  Posterior oropharynx without erythema.  No tonsillar edema or exudate.  Uvula midline.  No trismus.  Airway patent.  Able to handle oral secretions.  Bilateral TMs with good cone of light.  No maxillary or frontal sinus tenderness.  Eyes: Conjunctivae are normal. Pupils are equal, round, and reactive to light. Right eye exhibits no discharge. Left eye exhibits no discharge.  Neck: Normal range of motion. Neck supple.  Cardiovascular: Normal rate and regular rhythm. Exam reveals no friction rub.  No murmur heard. Pulmonary/Chest: Effort normal and breath sounds normal. No stridor. No respiratory distress. She has no wheezes. She has no rales.  Abdominal: Soft. Bowel sounds are normal. There is no tenderness.  Musculoskeletal:  Left knee non-tender to palpation.  No thigh swelling or tenderness.  Full active ROM of the knee joint. No joint line tenderness. No joint effusion or swelling appreciated. No abnormal alignment or patellar mobility. No bruising, erythema or warmth overlaying the joint. No varus/valgus laxity. Negative drawer's and McMurray's.  No crepitus. 2+ DP pulses bilaterally. All compartments are soft. Sensation to light touch intact in bilateral lower extremities.  Lymphadenopathy:    She has no cervical adenopathy.  Neurological: She is alert and oriented to person, place, and time. Coordination normal.  Skin: Skin is warm and dry. Capillary refill takes less than 2 seconds. She is not diaphoretic.  Psychiatric: She has a normal mood and affect. Her behavior is normal.  Nursing note and vitals reviewed.    ED Treatments / Results  Labs (all labs ordered are listed, but only abnormal results are displayed) Labs Reviewed - No data to display  EKG  EKG Interpretation None       Radiology Dg Chest 2 View  Result Date: 08/09/2017 CLINICAL DATA:  Chest pain and  shortness of breath. EXAM: CHEST  2 VIEW COMPARISON:  Chest x-ray dated May 10, 2017. FINDINGS: Stable mild cardiomegaly. Normal pulmonary vascularity. Bibasilar atelectasis/scarring, similar to prior study. No focal consolidation, pleural effusion, or pneumothorax. No acute osseous abnormality. IMPRESSION: Stable  chest.  No active cardiopulmonary disease. Electronically Signed   By: Titus Dubin M.D.   On: 08/09/2017 11:31   Dg Knee Complete 4 Views Left  Result Date: 08/09/2017 CLINICAL DATA:  LEFT knee pain for 3 days.  No injury. EXAM: LEFT KNEE - COMPLETE 4+ VIEW COMPARISON:  Knee radiographs February 18, 2015 FINDINGS: No evidence of fracture, dislocation, or joint effusion. No evidence of arthropathy or other focal bone abnormality. Faint lateral compartment intra-articular calcifications. Small suprapatellar joint effusion. Mild vascular calcifications. IMPRESSION: Faint intra-articular calcifications seen with CPPD. Small suprapatellar joint effusion.  No acute osseous process. Electronically Signed   By: Elon Alas M.D.   On: 08/09/2017 06:22    Procedures Procedures (including critical care time)  Medications Ordered in ED Medications  oxyCODONE-acetaminophen (PERCOCET/ROXICET) 5-325 MG per tablet 1 tablet (1 tablet Oral Given 08/09/17 1222)     Initial Impression / Assessment and Plan / ED Course  I have reviewed the triage vital signs and the nursing notes.  Pertinent labs & imaging results that were available during my care of the patient were reviewed by me and considered in my medical decision making (see chart for details).     Patient presents with multiple complaints  X-ray of knee with findings suggestive of CPPD.  No acute fracture or abnormality.  No overlying erythema, warmth to suggest infection on exam.  Left lower extremity neurovascularly intact.  Have counseled patient on use of tylenol for pain and RICE protocol.  Have discussed follow-up with her primary  doctor if symptoms are not improving in a week.  Pt CXR negative for acute infiltrate. Patient's symptoms are consistent with URI, likely viral etiology. Discussed that antibiotics are not indicated for viral infections. Pt will be discharged with symptomatic treatment including cough suppressant.  Verbalizes understanding and is agreeable with plan. Pt is hemodynamically stable & in NAD prior to dc.    Final Clinical Impressions(s) / ED Diagnoses   Final diagnoses:  Acute pain of left knee  Viral URI with cough    ED Discharge Orders        Ordered    guaiFENesin-codeine [facility] AC) 100-10 MG/5ML syrup  3 times daily PRN     08/09/17 1204    benzonatate (TESSALON) 100 MG capsule  Every 8 hours     08/09/17 1204       Bernarda Caffey 08/09/17 1939    Virgel Manifold, MD 08/10/17 [phone]

## 2017-08-09 NOTE — Discharge Instructions (Signed)
Chest x-ray and knee x-ray are both reassuring.  Please follow-up with your primary doctor for follow-up of your ongoing knee pain.  You can continue taking prescribed tramadol and Tylenol as needed.  Please also ice the knee twice a day for 15 minutes at a time and elevate it when you can.  I have written you a prescription for a cough medicine.  It has codeine in it which can make you drowsy so please do not drive, work or drink alcohol while taking it.  Return to the emergency department if you have any new or worsening symptoms.

## 2017-08-09 NOTE — ED Triage Notes (Signed)
Reports cold symptoms for two days and pain in left knee that goes up leg into buttocks.  Reports knee will lock up on her. Taken pain medication that she uses for her back with no relief reported.

## 2017-08-14 ENCOUNTER — Encounter (HOSPITAL_COMMUNITY): Payer: Self-pay | Admitting: Emergency Medicine

## 2017-08-14 ENCOUNTER — Emergency Department (HOSPITAL_COMMUNITY)
Admission: EM | Admit: 2017-08-14 | Discharge: 2017-08-14 | Disposition: A | Discharge status: Home / Self Care | Payer: Medicare Other | Attending: Emergency Medicine | Admitting: Emergency Medicine

## 2017-08-14 ENCOUNTER — Emergency Department (HOSPITAL_COMMUNITY): Payer: Medicare Other

## 2017-08-14 DIAGNOSIS — J45909 Unspecified asthma, uncomplicated: Secondary | ICD-10-CM | POA: Diagnosis not present

## 2017-08-14 DIAGNOSIS — I130 Hypertensive heart and chronic kidney disease with heart failure and stage 1 through stage 4 chronic kidney disease, or unspecified chronic kidney disease: Secondary | ICD-10-CM | POA: Insufficient documentation

## 2017-08-14 DIAGNOSIS — N182 Chronic kidney disease, stage 2 (mild): Secondary | ICD-10-CM | POA: Diagnosis not present

## 2017-08-14 DIAGNOSIS — Y939 Activity, unspecified: Secondary | ICD-10-CM | POA: Diagnosis not present

## 2017-08-14 DIAGNOSIS — S8392XA Sprain of unspecified site of left knee, initial encounter: Secondary | ICD-10-CM | POA: Insufficient documentation

## 2017-08-14 DIAGNOSIS — Y999 Unspecified external cause status: Secondary | ICD-10-CM | POA: Diagnosis not present

## 2017-08-14 DIAGNOSIS — J189 Pneumonia, unspecified organism: Secondary | ICD-10-CM

## 2017-08-14 DIAGNOSIS — Z794 Long term (current) use of insulin: Secondary | ICD-10-CM | POA: Insufficient documentation

## 2017-08-14 DIAGNOSIS — E119 Type 2 diabetes mellitus without complications: Secondary | ICD-10-CM | POA: Diagnosis not present

## 2017-08-14 DIAGNOSIS — Z7982 Long term (current) use of aspirin: Secondary | ICD-10-CM | POA: Insufficient documentation

## 2017-08-14 DIAGNOSIS — X501XXA Overexertion from prolonged static or awkward postures, initial encounter: Secondary | ICD-10-CM | POA: Diagnosis not present

## 2017-08-14 DIAGNOSIS — Y929 Unspecified place or not applicable: Secondary | ICD-10-CM | POA: Diagnosis not present

## 2017-08-14 DIAGNOSIS — R0602 Shortness of breath: Secondary | ICD-10-CM | POA: Diagnosis not present

## 2017-08-14 DIAGNOSIS — I509 Heart failure, unspecified: Secondary | ICD-10-CM | POA: Insufficient documentation

## 2017-08-14 DIAGNOSIS — Z79899 Other long term (current) drug therapy: Secondary | ICD-10-CM | POA: Insufficient documentation

## 2017-08-14 DIAGNOSIS — J181 Lobar pneumonia, unspecified organism: Secondary | ICD-10-CM | POA: Insufficient documentation

## 2017-08-14 DIAGNOSIS — S83402A Sprain of unspecified collateral ligament of left knee, initial encounter: Secondary | ICD-10-CM | POA: Diagnosis not present

## 2017-08-14 DIAGNOSIS — M25562 Pain in left knee: Secondary | ICD-10-CM | POA: Diagnosis not present

## 2017-08-14 DIAGNOSIS — R1310 Dysphagia, unspecified: Secondary | ICD-10-CM | POA: Diagnosis not present

## 2017-08-14 DIAGNOSIS — R05 Cough: Secondary | ICD-10-CM | POA: Diagnosis not present

## 2017-08-14 LAB — CBG MONITORING, ED: GLUCOSE-CAPILLARY: 243 mg/dL — AB (ref 65–99)

## 2017-08-14 MED ORDER — LEVOFLOXACIN 750 MG PO TABS: 750.0000 mg | ORAL_TABLET | Freq: Every day | ORAL | 0 refills | Status: DC

## 2017-08-14 MED ORDER — HYDROCODONE-ACETAMINOPHEN 5-325 MG PO TABS
1.0000 | ORAL_TABLET | Freq: Once | ORAL | Status: AC
  Administered 2017-08-14: 1 via ORAL
  Filled 2017-08-14: qty 1

## 2017-08-14 MED ORDER — LIDOCAINE HCL (PF) 1 % IJ SOLN
INTRAMUSCULAR | Status: AC
  Administered 2017-08-14: 07:00:00
  Filled 2017-08-14: qty 5

## 2017-08-14 MED ORDER — PREDNISONE 20 MG PO TABS: 40.0000 mg | ORAL_TABLET | Freq: Every day | ORAL | 0 refills | Status: DC

## 2017-08-14 MED ORDER — LEVOFLOXACIN 750 MG PO TABS: 750.0000 mg | ORAL_TABLET | Freq: Once | ORAL | Status: DC

## 2017-08-14 MED ORDER — HYDROCODONE-ACETAMINOPHEN 5-325 MG PO TABS: 1.0000 | ORAL_TABLET | ORAL | 0 refills | Status: DC | PRN

## 2017-08-14 MED ORDER — CEFTRIAXONE SODIUM 1 G IJ SOLR
1.0000 g | Freq: Once | INTRAMUSCULAR | Status: AC
  Administered 2017-08-14: 1 g via INTRAMUSCULAR
  Filled 2017-08-14: qty 10

## 2017-08-14 NOTE — ED Notes (Addendum)
Pt reports she hurt her L knee when she stepped off ambulance upon arrival to hospital.  States when she went for her chest x-ray she was unable to bear weight on L knee.

## 2017-08-14 NOTE — ED Triage Notes (Signed)
BIB EMS from home for cough, pt reports cold like S/S for past few days, was seen on 2/4 for same, dx with URI. Pt states she has seen no improvement

## 2017-08-14 NOTE — ED Provider Notes (Signed)
[facility] EMERGENCY DEPARTMENT Provider Note   CSN: [id number] Arrival date & time: 08/14/17  0232     History   Chief Complaint Chief Complaint  Patient presents with  . Cough    HPI Heidi [name] is a 67 y.o. female.  Patient presents to the emergency department for evaluation of cough.  Patient reports that she has been sick for a week.  She has been taking multiple over-the-counter cold medicines without improvement. She saw her doctor and was prescribed a Z-Pak two days ago.  She reports that her cough has been persistent.  She has a history of asthma and has been using her albuterol nebulizer without improvement.  She has now developed a sore throat, worsens when she tries to swallow.  Patient also complaining of left knee pain.  She reports that she injured her knee getting out of the ambulance.  She reports that it twisted and it is now painful to try to put weight on it.      Past Medical History:  Diagnosis Date  . Adenomatous colon polyp   . Anemia   . Anxiety   . Asthma   . CHF (congestive heart failure) ([place])   . Diabetes mellitus without complication ([place])   . High cholesterol   . Hypertension     Patient Active Problem List   Diagnosis Date Noted  . Torn left ear lobe 11/20/2016  . Chronic migraine w/o aura w/o status migrainosus, not intractable 06/27/2016  . Post-menopausal atrophic vaginitis 11/14/2015  . Primary osteoarthritis of both knees 07/18/2015  . De Quervain's tenosynovitis, right 05/08/2015  . GERD (gastroesophageal reflux disease) 05/08/2015  . Renal cyst, right 05/08/2015  . Adenomatous polyp of colon 03/26/2015  . HTN (hypertension) 02/12/2015  . Cognitive deficit due to old head trauma 02/12/2015  . Asthma, chronic 02/12/2015  . Chronic left shoulder pain 02/11/2015  . Thyroid nodule 12/18/2014  . Chronic neck pain   . HLD (hyperlipidemia)   . OSA treated with BiPAP 11/27/2014  . Obesity hypoventilation  syndrome ([place]) 11/27/2014  . Demand ischemia ([place])   . Diabetes type 2, uncontrolled ([place])   . Cardiomegaly 11/25/2014  . CHF (congestive heart failure) ([place]) 11/25/2014  . 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 ABDOMINAL HYSTERECTOMY    . CATARACT EXTRACTION Left   . CATARACT EXTRACTION     LT 01/2017, 02/2017 on RT  . CESAREAN SECTION      OB History    No data available       Home Medications    Prior to Admission medications   Medication Sig Start Date End Date Taking? Authorizing Provider  albuterol (PROVENTIL HFA;VENTOLIN HFA) 108 (90 Base) MCG/ACT inhaler Inhale 2 puffs into the lungs every 6 (six) hours as needed for wheezing or shortness of breath. 06/04/17   Ladell Pier, MD  albuterol (PROVENTIL) (2.5 MG/3ML) 0.083% nebulizer solution INHALE CONTENTS OF 1 VIAL VIA NEBULIZER EVERY 6 HOURS AS NEEDED FOR WHEEZING OR SHORTNESS OF BREATH 07/10/15   Funches, Adriana Mccallum, MD  Ascorbic Acid (VITAMIN C PO) Take 1 tablet by mouth daily.    [provider]  aspirin 81 MG chewable tablet Chew 81 mg by mouth daily.     [provider]  atenolol (TENORMIN) 25 MG tablet Take 1 tablet (25 mg total) by mouth daily. 02/26/17   Ladell Pier, MD  atorvastatin (LIPITOR) 40  MG tablet TAKE 1 TABLET BY MOUTH DAILY AT 6 PM 07/13/17   Ladell Pier, MD  azithromycin (ZITHROMAX) 250 MG tablet Take 1 tablet (250 mg total) by mouth daily. Take first 2 tablets together, then 1 every day until finished. 07/12/17   Marney Setting, NP  benzonatate (TESSALON) 100 MG capsule Take 1 capsule (100 mg total) by mouth every 8 (eight) hours. 08/09/17   Glyn Ade, PA-C  bumetanide (BUMEX) 0.5 MG tablet Take 1 tablet (0.5 mg total) by mouth daily. 02/26/17   Ladell Pier, MD  calcium-vitamin D (OSCAL WITH D) 500-200 MG-UNIT tablet Take 1 tablet by mouth daily. 06/04/17   Ladell Pier, MD  conjugated  estrogens (PREMARIN) vaginal cream Insert 0.5 grams vaginally once a wk. 05/19/17   Ladell Pier, MD  dextromethorphan-guaiFENesin [facility] DM) 30-600 MG 12hr tablet Take 1 tablet by mouth as needed for cough.    [provider]  glucose blood (FREESTYLE TEST STRIPS) test strip Use as instructed for 3 times daily testing of blood glucose. 05/31/17   Ladell Pier, MD  guaiFENesin-codeine (ROBITUSSIN AC) 100-10 MG/5ML syrup Take 5 mLs by mouth 3 (three) times daily as needed for cough. 08/09/17   Glyn Ade, PA-C  hydrochlorothiazide (HYDRODIURIL) 25 MG tablet Take 1 tablet (25 mg total) by mouth daily. 02/26/17   Ladell Pier, MD  HYDROcodone-acetaminophen (NORCO/VICODIN) 5-325 MG tablet Take 1 tablet by mouth every 4 (four) hours as needed for moderate pain. 08/14/17   Orpah Greek, MD  insulin lispro protamine-lispro (HUMALOG 75/25 MIX) (75-25) 100 UNIT/ML SUSP injection Inject subcutaneously 60 U in the morning with food and 45 U at night 06/04/17   Ladell Pier, MD  INSULIN SYRINGE 1CC/29G 29G X 1/2" 1 ML MISC USE THREE TIMES DAILY AS DIRECTED 03/16/17   Ladell Pier, MD  Lancets (FREESTYLE) lancets Use as directed 3 times daily. E11.9 06/08/17   Ladell Pier, MD  levofloxacin (LEVAQUIN) 750 MG tablet Take 1 tablet (750 mg total) by mouth daily. 08/14/17   Orpah Greek, MD  lisinopril (PRINIVIL,ZESTRIL) 5 MG tablet Take 1 tablet (5 mg total) by mouth daily. 05/12/16   Funches, Adriana Mccallum, MD  montelukast (SINGULAIR) 10 MG tablet Take 1 tablet (10 mg total) by mouth daily. 02/26/17   Ladell Pier, MD  Multiple Vitamin (MULTIVITAMIN WITH MINERALS) TABS tablet Take 1 tablet by mouth daily.    [provider]  nitroGLYCERIN (NITROSTAT) 0.3 MG SL tablet Place 1 tablet (0.3 mg total) under the tongue every 5 (five) minutes as needed for chest pain. Max 3 tablets in 15 minutes 08/07/16   Boykin Nearing, MD  omeprazole (PRILOSEC) 40  MG capsule TAKE ONE CAPSULE BY MOUTH DAILY 07/28/17   Ladell Pier, MD  ondansetron (ZOFRAN) 4 MG tablet Take 1 tablet (4 mg total) by mouth every 6 (six) hours. 07/12/17   Marney Setting, NP  Potassium Chloride ER 20 MEQ TBCR Take 20 mEq by mouth daily. 06/16/17   Ladell Pier, MD  predniSONE (DELTASONE) 20 MG tablet Take 2 tablets (40 mg total) by mouth daily with breakfast. 08/14/17   Pollina, Gwenyth Allegra, MD  topiramate (TOPAMAX) 25 MG capsule Take 3 capsules (75 mg total) by mouth 2 (two) times daily. 01/12/17   Melvenia Beam, MD  traMADol (ULTRAM) 50 MG tablet Take 1 tablet (50 mg total) by mouth every 8 (eight) hours as needed. 02/19/17   Marcello Moores,  Vanessa Ralphs, NP    Family History Family History  Problem Relation Age of Onset  . Colon cancer Father   . Diabetes type II Sister   . Diabetes type II Brother   . Colon cancer Sister   . Migraines Neg Hx     Social History Social History   Tobacco Use  . Smoking status: Never Smoker  . Smokeless tobacco: Never Used  Substance Use Topics  . Alcohol use: No  . Drug use: No     Allergies   Patient has no known allergies.   Review of Systems Review of Systems  HENT: Positive for congestion and sore throat.   Respiratory: Positive for cough and shortness of breath.   All other systems reviewed and are negative.    Physical Exam Updated Vital Signs BP 93/73 (BP Location: Right Arm)   Pulse 85   Temp 97.8 F (36.6 C) (Oral)   Resp 16   Ht 5\' 4"  (1.626 m)   Wt 97.5 kg (215 lb)   SpO2 94%   BMI 36.90 kg/m   Physical Exam  Constitutional: She is oriented to person, place, and time. She appears well-developed and well-nourished. No distress.  HENT:  Head: Normocephalic and atraumatic.  Right Ear: Hearing normal.  Left Ear: Hearing normal.  Nose: Nose normal.  Mouth/Throat: Oropharynx is clear and moist and mucous membranes are normal.  Eyes: Conjunctivae and EOM are normal. Pupils are equal, round, and  reactive to light.  Neck: Normal range of motion. Neck supple.  Cardiovascular: Regular rhythm, S1 normal and S2 normal. Exam reveals no gallop and no friction rub.  No murmur heard. Pulmonary/Chest: Effort normal and breath sounds normal. No respiratory distress. She exhibits no tenderness.  Abdominal: Soft. Normal appearance and bowel sounds are normal. There is no hepatosplenomegaly. There is no tenderness. There is no rebound, no guarding, no tenderness at McBurney's point and negative Murphy's sign. No hernia.  Musculoskeletal: Normal range of motion.       Left knee: She exhibits normal range of motion, no swelling, no effusion, no ecchymosis, no deformity and normal alignment. Tenderness found.  Neurological: She is alert and oriented to person, place, and time. She has normal strength. No cranial nerve deficit or sensory deficit. Coordination normal. GCS eye subscore is 4. GCS verbal subscore is 5. GCS motor subscore is 6.  Skin: Skin is warm, dry and intact. No rash noted. No cyanosis.  Psychiatric: She has a normal mood and affect. Her speech is normal and behavior is normal. Thought content normal.  Nursing note and vitals reviewed.    ED Treatments / Results  Labs (all labs ordered are listed, but only abnormal results are displayed) Labs Reviewed  CBG MONITORING, ED - Abnormal; Notable for the following components:      Result Value   Glucose-Capillary 243 (*)    All other components within normal limits    EKG  EKG Interpretation None       Radiology Dg Chest 2 View  Result Date: 08/14/2017 CLINICAL DATA:  Cough EXAM: CHEST  2 VIEW COMPARISON:  08/09/2017 FINDINGS: Low lung volumes. Mild cardiomegaly. Mild left lower lobe pulmonary infiltrate. No significant pleural effusion. No pneumothorax. IMPRESSION: 1. Mild left lower lobe infiltrate 2. Mild cardiomegaly Electronically Signed   By: Donavan Foil M.D.   On: 08/14/2017 03:35   Dg Knee Complete 4 Views  Left  Result Date: 08/14/2017 CLINICAL DATA:  Acute onset of left knee pain after stepping  off ambulance. Initial encounter. EXAM: LEFT KNEE - COMPLETE 4+ VIEW COMPARISON:  Left knee radiographs performed 08/09/2017 FINDINGS: There is no evidence of fracture or dislocation. The joint spaces are preserved. There is mild calcification of the lateral meniscus. Mild marginal osteophyte formation is noted at the lateral compartment. No significant joint effusion is seen. The visualized soft tissues are normal in appearance. IMPRESSION: No evidence of fracture or dislocation. Electronically Signed   By: Garald Balding M.D.   On: 08/14/2017 04:26    Procedures Procedures (including critical care time)  Medications Ordered in ED Medications  cefTRIAXone (ROCEPHIN) injection 1 g (1 g Intramuscular Given 08/14/17 0659)  lidocaine (PF) (XYLOCAINE) 1 % injection (  Given 08/14/17 0659)  HYDROcodone-acetaminophen (NORCO/VICODIN) 5-325 MG per tablet 1 tablet (1 tablet Oral Given 08/14/17 6270)     Initial Impression / Assessment and Plan / ED Course  I have reviewed the triage vital signs and the nursing notes.  Pertinent labs & imaging results that were available during my care of the patient were reviewed by me and considered in my medical decision making (see chart for details).     Patient presents to the emergency department for evaluation of persisting cough, chest congestion, sore throat.  She reports a history of asthma, does not have any wheezing or bronchospasm on examination.  She is afebrile.  Oxygen saturation is 97% on room air.  Chest x-ray does show left lower lobe infiltrate.  It is unclear if this is clearing and has responded to the Zithromax or is persistent.  She will require additional antibiotic therapy, however, as she is breathing comfortably and not hypoxic, she does not require hospitalization.  Will treat with prednisone, albuterol, Levaquin.  Patient complaining of left knee pain  after twisting it getting out of the ambulance.  X-ray negative.  No joint effusion, no limited range of motion, no swelling.  There is no ligamentous instability.  Will provide Ace wrap, crutches, analgesia.  Final Clinical Impressions(s) / ED Diagnoses   Final diagnoses:  Community acquired pneumonia of left lower lobe of lung ([place])  Sprain of left knee, unspecified ligament, initial encounter    ED Discharge Orders        Ordered    predniSONE (DELTASONE) 20 MG tablet  Daily with breakfast     08/14/17 0618    levofloxacin (LEVAQUIN) 750 MG tablet  Daily     08/14/17 0618    HYDROcodone-acetaminophen (NORCO/VICODIN) 5-325 MG tablet  Every 4 hours PRN     08/14/17 0618       Orpah Greek, MD 08/14/17 [phone]

## 2017-08-15 ENCOUNTER — Other Ambulatory Visit: Payer: Self-pay | Admitting: Internal Medicine

## 2017-08-15 DIAGNOSIS — IMO0002 Reserved for concepts with insufficient information to code with codable children: Secondary | ICD-10-CM

## 2017-08-15 DIAGNOSIS — E1165 Type 2 diabetes mellitus with hyperglycemia: Secondary | ICD-10-CM

## 2017-08-23 ENCOUNTER — Other Ambulatory Visit: Payer: Self-pay | Admitting: Internal Medicine

## 2017-08-23 ENCOUNTER — Ambulatory Visit: Payer: Medicare Other | Attending: Internal Medicine | Admitting: Internal Medicine

## 2017-08-23 ENCOUNTER — Encounter: Payer: Self-pay | Admitting: Internal Medicine

## 2017-08-23 VITALS — BP 123/78 | HR 80 | Temp 98.2°F | Resp 15 | Ht 64.0 in | Wt 227.8 lb

## 2017-08-23 DIAGNOSIS — Z7982 Long term (current) use of aspirin: Secondary | ICD-10-CM | POA: Insufficient documentation

## 2017-08-23 DIAGNOSIS — J189 Pneumonia, unspecified organism: Secondary | ICD-10-CM | POA: Diagnosis not present

## 2017-08-23 DIAGNOSIS — Z833 Family history of diabetes mellitus: Secondary | ICD-10-CM | POA: Diagnosis not present

## 2017-08-23 DIAGNOSIS — K219 Gastro-esophageal reflux disease without esophagitis: Secondary | ICD-10-CM | POA: Insufficient documentation

## 2017-08-23 DIAGNOSIS — E785 Hyperlipidemia, unspecified: Secondary | ICD-10-CM | POA: Insufficient documentation

## 2017-08-23 DIAGNOSIS — E1122 Type 2 diabetes mellitus with diabetic chronic kidney disease: Secondary | ICD-10-CM | POA: Diagnosis not present

## 2017-08-23 DIAGNOSIS — M1712 Unilateral primary osteoarthritis, left knee: Secondary | ICD-10-CM | POA: Insufficient documentation

## 2017-08-23 DIAGNOSIS — I130 Hypertensive heart and chronic kidney disease with heart failure and stage 1 through stage 4 chronic kidney disease, or unspecified chronic kidney disease: Secondary | ICD-10-CM | POA: Insufficient documentation

## 2017-08-23 DIAGNOSIS — Z800 Family history of malignant neoplasm of digestive organs: Secondary | ICD-10-CM | POA: Insufficient documentation

## 2017-08-23 DIAGNOSIS — Z6839 Body mass index (BMI) 39.0-39.9, adult: Secondary | ICD-10-CM | POA: Insufficient documentation

## 2017-08-23 DIAGNOSIS — I10 Essential (primary) hypertension: Secondary | ICD-10-CM | POA: Diagnosis present

## 2017-08-23 DIAGNOSIS — J45909 Unspecified asthma, uncomplicated: Secondary | ICD-10-CM | POA: Diagnosis not present

## 2017-08-23 DIAGNOSIS — Z79899 Other long term (current) drug therapy: Secondary | ICD-10-CM | POA: Insufficient documentation

## 2017-08-23 DIAGNOSIS — M5030 Other cervical disc degeneration, unspecified cervical region: Secondary | ICD-10-CM | POA: Diagnosis not present

## 2017-08-23 DIAGNOSIS — J181 Lobar pneumonia, unspecified organism: Secondary | ICD-10-CM

## 2017-08-23 DIAGNOSIS — E662 Morbid (severe) obesity with alveolar hypoventilation: Secondary | ICD-10-CM | POA: Insufficient documentation

## 2017-08-23 DIAGNOSIS — Z794 Long term (current) use of insulin: Secondary | ICD-10-CM | POA: Insufficient documentation

## 2017-08-23 DIAGNOSIS — N182 Chronic kidney disease, stage 2 (mild): Secondary | ICD-10-CM | POA: Diagnosis not present

## 2017-08-23 DIAGNOSIS — M25562 Pain in left knee: Secondary | ICD-10-CM | POA: Diagnosis not present

## 2017-08-23 DIAGNOSIS — I509 Heart failure, unspecified: Secondary | ICD-10-CM | POA: Insufficient documentation

## 2017-08-23 MED ORDER — DICLOFENAC SODIUM 1 % TD GEL: 2.0000 g | Freq: Four times a day (QID) | TRANSDERMAL | 1 refills | Status: DC

## 2017-08-23 NOTE — Progress Notes (Signed)
Patient ID: Heidi [name], female    DOB: 04-Oct-1950  MRN: [id number]  CC: Hospitalization Follow-up   Subjective: Heidi [name] is a 67 y.o. female who presents for f/u from ER Her concerns today include:  67 year old female with history of HTN, CHF, migraines(followed by [facility] on Botox inj), obstructive sleep apnea on BiPAP, asthma, GERD, diabetes2, CKD stage II, OA knees,DDD of cervical spine (followed by Dr. Letta Pate and on Tramadol)obesity, HL,  1. Seen in ER 08/09/2017 for acute respiratory symptoms.  Dx with URI.  Returned to ER 08/14/2017 via EMS for continue symptoms.  X-ray revealed mild LLL infiltrate. Given Levaquin and Prednisone  Pt upset that she was not given abx and steroid on initial visit; given cough syrup instead.   -feels "I'm not out of the woods with this.  I still have a lot on the walls."  When asked what she means by the walls, patient states she feels she still has a lot of phlegm in her chest.  Has to use nebulizer BID to get mucus up Also taking Mucinex Sinus Max BID  2.  Pain LT knee: Has history of osteoarthritis of the knee.  However on 08/14/2017 she reportedly "jarred" her knee while stepping out of the ambulance to go into the ED. patient reports that she was in a lot of pain immediately.  She did not have any swelling.  Knee was examined by ED physician and no swelling or effusion noted.  X-ray revealed mild arthritis changes and some calcifications near the meniscus otherwise negative for acute findings. She was given some Norco to use for pain which she has completed.  She is requesting a refill stating that she is still having pain when she walks.  She is on tramadol through Dr. Tivis Ringer for her lower back however she reports this does not help for the knee.  She is ambulating with a 4 prong cane today  Patient Active Problem List   Diagnosis Date Noted  . Torn left ear lobe 11/20/2016  . Chronic migraine w/o aura w/o status migrainosus,  not intractable 06/27/2016  . Post-menopausal atrophic vaginitis 11/14/2015  . Primary osteoarthritis of both knees 07/18/2015  . De Quervain's tenosynovitis, right 05/08/2015  . GERD (gastroesophageal reflux disease) 05/08/2015  . Renal cyst, right 05/08/2015  . Adenomatous polyp of colon 03/26/2015  . HTN (hypertension) 02/12/2015  . Cognitive deficit due to old head trauma 02/12/2015  . Asthma, chronic 02/12/2015  . Chronic left shoulder pain 02/11/2015  . Thyroid nodule 12/18/2014  . Chronic neck pain   . HLD (hyperlipidemia)   . OSA treated with BiPAP 11/27/2014  . Obesity hypoventilation syndrome ([place]) 11/27/2014  . Demand ischemia ([place])   . Diabetes type 2, uncontrolled ([place])   . Cardiomegaly 11/25/2014  . CHF (congestive heart failure) ([place]) 11/25/2014  . 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 albuterol (PROVENTIL HFA;VENTOLIN HFA) 108 (90 Base) MCG/ACT inhaler Inhale 2 puffs into the lungs every 6 (six) hours as needed for wheezing or shortness of breath. 1 Inhaler 3  . albuterol (PROVENTIL) (2.5 MG/3ML) 0.083% nebulizer solution INHALE CONTENTS OF 1 VIAL VIA NEBULIZER EVERY 6 HOURS AS NEEDED FOR WHEEZING OR SHORTNESS OF BREATH 450 mL 1  . Ascorbic Acid (VITAMIN C PO) Take 1 tablet by mouth daily.    Marland Kitchen aspirin 81 MG chewable tablet Chew 81 mg  by mouth daily.     Marland Kitchen atenolol (TENORMIN) 25 MG tablet Take 1 tablet (25 mg total) by mouth daily. 90 tablet 6  . atorvastatin (LIPITOR) 40 MG tablet TAKE 1 TABLET BY MOUTH DAILY AT 6 PM 90 tablet 0  . benzonatate (TESSALON) 100 MG capsule Take 1 capsule (100 mg total) by mouth every 8 (eight) hours. 21 capsule 0  . bumetanide (BUMEX) 0.5 MG tablet Take 1 tablet (0.5 mg total) by mouth daily. 90 tablet 3  . calcium-vitamin D (OSCAL WITH D) 500-200 MG-UNIT tablet Take 1 tablet by mouth daily. 30 tablet 3  . conjugated estrogens  (PREMARIN) vaginal cream Insert 0.5 grams vaginally once a wk. 30 g 1  . dextromethorphan-guaiFENesin (MUCINEX DM) 30-600 MG 12hr tablet Take 1 tablet by mouth as needed for cough.    Marland Kitchen glucose blood (FREESTYLE TEST STRIPS) test strip Use as instructed for 3 times daily testing of blood glucose. 300 each 2  . guaiFENesin-codeine (ROBITUSSIN AC) 100-10 MG/5ML syrup Take 5 mLs by mouth 3 (three) times daily as needed for cough. (Patient not taking: Reported on 08/23/2017) 120 mL 0  . hydrochlorothiazide (HYDRODIURIL) 25 MG tablet Take 1 tablet (25 mg total) by mouth daily. 90 tablet 3  . insulin lispro protamine-lispro (HUMALOG 75/25 MIX) (75-25) 100 UNIT/ML SUSP injection Inject subcutaneously 60 U in the morning with food and 45 U at night 40 mL 11  . INSULIN SYRINGE 1CC/29G 29G X 1/2" 1 ML MISC USE THREE TIMES DAILY AS DIRECTED 100 each 2  . Lancets (FREESTYLE) lancets Use as directed 3 times daily. E11.9 100 each 5  . levofloxacin (LEVAQUIN) 750 MG tablet Take 1 tablet (750 mg total) by mouth daily. 5 tablet 0  . lisinopril (PRINIVIL,ZESTRIL) 5 MG tablet Take 1 tablet (5 mg total) by mouth daily. 90 tablet 3  . Multiple Vitamin (MULTIVITAMIN WITH MINERALS) TABS tablet Take 1 tablet by mouth daily.    . nitroGLYCERIN (NITROSTAT) 0.3 MG SL tablet Place 1 tablet (0.3 mg total) under the tongue every 5 (five) minutes as needed for chest pain. Max 3 tablets in 15 minutes 90 tablet 0  . omeprazole (PRILOSEC) 40 MG capsule TAKE ONE CAPSULE BY MOUTH DAILY 90 capsule 0  . ondansetron (ZOFRAN) 4 MG tablet Take 1 tablet (4 mg total) by mouth every 6 (six) hours. 12 tablet 0  . Potassium Chloride ER 20 MEQ TBCR Take 20 mEq by mouth daily. 90 tablet 0  . predniSONE (DELTASONE) 20 MG tablet Take 2 tablets (40 mg total) by mouth daily with breakfast. 10 tablet 0  . topiramate (TOPAMAX) 25 MG capsule Take 3 capsules (75 mg total) by mouth 2 (two) times daily. 180 capsule 11  . traMADol (ULTRAM) 50 MG tablet Take 1  tablet (50 mg total) by mouth every 8 (eight) hours as needed. 90 tablet 2   Current Facility-Administered Medications on File Prior to Visit  Medication Dose Route Frequency Provider Last Rate Last Dose  . 0.9 %  sodium chloride infusion  500 mL Intravenous Continuous Irene Shipper, MD        No Known Allergies  Social History   Socioeconomic History  . Marital status: Legally Separated    Spouse name: Not on file  . Number of children: 4  . Years of education: Not on file  . Highest education level: Not on file  Social Needs  . Financial resource strain: Not on file  . Food insecurity - worry: Not  on file  . Food insecurity - inability: Not on file  . Transportation needs - medical: Not on file  . Transportation needs - non-medical: Not on file  Occupational History  . Occupation: Disabled  Tobacco Use  . Smoking status: Never Smoker  . Smokeless tobacco: Never Used  Substance and Sexual Activity  . Alcohol use: No  . Drug use: No  . Sexual activity: Not on file  Other Topics Concern  . Not on file  Social History Narrative   Lives with daughter   Caffeine use: 1 cup coffee per day    Family History  Problem Relation Age of Onset  . Colon cancer Father   . Diabetes type II Sister   . Diabetes type II Brother   . Colon cancer Sister   . Migraines Neg Hx     Past Surgical History:  Procedure Laterality Date  . ABDOMINAL HYSTERECTOMY    . CATARACT EXTRACTION Left   . CATARACT EXTRACTION     LT 01/2017, 02/2017 on RT  . CESAREAN SECTION      ROS: Review of Systems Negative except as stated above PHYSICAL EXAM: BP 123/78   Pulse 80   Temp 98.2 F (36.8 C) (Oral)   Resp 15   Ht 5\' 4"  (1.626 m)   Wt 227 lb 12.8 oz (103.3 kg)   SpO2 94%   BMI 39.10 kg/m   Physical Exam  General appearance - alert, well appearing, and in no distress Mental status - alert, oriented to person, place, and time, normal mood, behavior, speech, dress, motor activity, and  thought processes Nose - normal and patent, no erythema, discharge or polyps Mouth - mucous membranes moist, pharynx normal without lesions Chest -breath sounds equally decreased at the bases.  Few scattered wheezes heard when she coughs.  No labored breathing observed heart - normal rate, regular rhythm, normal S1, S2, no murmurs, rubs, clicks or gallops Musculoskeletal -left knee: No edema or erythema seen.  No point tenderness on palpation of the medial and lateral joint lines.  Slight discomfort with passive movement of the knee.  She is noted to cross her left ankle over the right knee without difficulty when asked to take off her boots.  No limping seen on observing her gait  ASSESSMENT AND PLAN: 1. Community acquired pneumonia of left lower lobe of lung ([place]) She has completed 5-day course of Levaquin and prednisone.  I do not think we need to give any more antibiotics.  We will check a CBC with differential.  And repeat chest x-ray to make sure findings have not progressed - CBC With Differential - Basic metabolic panel - DG Chest 2 View; Future  2. Osteoarthritis of left knee, unspecified osteoarthritis type - diclofenac sodium (VOLTAREN) 1 % GEL; Apply 2 g topically 4 (four) times daily.  Dispense: 100 g; Refill: 1 - Ambulatory referral to Orthopedic Surgery  3. Acute pain of left knee -I declined refill on Norco.  Advised patient to continue taking tramadol as prescribed by her pain specialist.  If creatinine and GFR are okay on BMP will consider meloxicam.  In the meantime I have prescribed some Voltaren gel and will refer to orthopedics - Ambulatory referral to Orthopedic Surgery  4. Hyperlipidemia, unspecified hyperlipidemia type - Lipid panel   Patient was given the opportunity to ask questions.  Patient verbalized understanding of the plan and was able to repeat key elements of the plan.   Orders Placed This Encounter  Procedures  .  DG Chest 2 View  . CBC With  Differential  . Basic metabolic panel  . Lipid panel  . Ambulatory referral to Orthopedic Surgery     Requested Prescriptions   Signed Prescriptions Disp Refills  . diclofenac sodium (VOLTAREN) 1 % GEL 100 g 1    Sig: Apply 2 g topically 4 (four) times daily.    No Follow-up on file.  Karle Plumber, MD, FACP

## 2017-08-23 NOTE — Progress Notes (Signed)
Pt states she still be coughing   Pt states she has chills and hot flashes

## 2017-08-24 ENCOUNTER — Ambulatory Visit (HOSPITAL_COMMUNITY)
Admission: RE | Admit: 2017-08-24 | Discharge: 2017-08-24 | Disposition: A | Discharge status: Home / Self Care | Payer: Medicare Other | Source: Ambulatory Visit | Attending: Internal Medicine | Admitting: Internal Medicine

## 2017-08-24 DIAGNOSIS — J189 Pneumonia, unspecified organism: Secondary | ICD-10-CM | POA: Diagnosis not present

## 2017-08-24 DIAGNOSIS — J181 Lobar pneumonia, unspecified organism: Secondary | ICD-10-CM | POA: Insufficient documentation

## 2017-08-24 LAB — BASIC METABOLIC PANEL
BUN/Creatinine Ratio: 13 (ref 12–28)
BUN: 16 mg/dL (ref 8–27)
CALCIUM: 9.9 mg/dL (ref 8.7–10.3)
CO2: 26 mmol/L (ref 20–29)
CREATININE: 1.26 mg/dL — AB (ref 0.57–1.00)
Chloride: 97 mmol/L (ref 96–106)
GFR calc Af Amer: 52 mL/min/{1.73_m2} — ABNORMAL LOW (ref >59)
GFR, EST NON AFRICAN AMERICAN: 45 mL/min/{1.73_m2} — AB (ref >59)
GLUCOSE: 119 mg/dL — AB (ref 65–99)
Potassium: 3.9 mmol/L (ref 3.5–5.2)
SODIUM: 138 mmol/L (ref 134–144)

## 2017-08-24 LAB — CBC WITH DIFFERENTIAL
BASOS ABS: 0.1 10*3/uL (ref 0.0–0.2)
Basos: 1 %
EOS (ABSOLUTE): 0.4 10*3/uL (ref 0.0–0.4)
EOS: 4 %
HEMATOCRIT: 38.8 % (ref 34.0–46.6)
HEMOGLOBIN: 13.2 g/dL (ref 11.1–15.9)
IMMATURE GRANS (ABS): 0.1 10*3/uL (ref 0.0–0.1)
IMMATURE GRANULOCYTES: 1 %
Lymphocytes Absolute: 4.3 10*3/uL — ABNORMAL HIGH (ref 0.7–3.1)
Lymphs: 41 %
MCH: 28.1 pg (ref 26.6–33.0)
MCHC: 34 g/dL (ref 31.5–35.7)
MCV: 83 fL (ref 79–97)
Monocytes Absolute: 0.7 10*3/uL (ref 0.1–0.9)
Monocytes: 7 %
Neutrophils Absolute: 5 10*3/uL (ref 1.4–7.0)
Neutrophils: 46 %
RBC: 4.7 x10E6/uL (ref 3.77–5.28)
RDW: 15 % (ref 12.3–15.4)
WBC: 10.5 10*3/uL (ref 3.4–10.8)

## 2017-08-24 LAB — LIPID PANEL
CHOL/HDL RATIO: 1.9 ratio (ref 0.0–4.4)
CHOLESTEROL TOTAL: 133 mg/dL (ref 100–199)
HDL: 69 mg/dL (ref >39)
LDL CALC: 31 mg/dL (ref 0–99)
Triglycerides: 167 mg/dL — ABNORMAL HIGH (ref 0–149)
VLDL Cholesterol Cal: 33 mg/dL (ref 5–40)

## 2017-08-25 ENCOUNTER — Other Ambulatory Visit: Payer: Self-pay | Admitting: Internal Medicine

## 2017-08-25 ENCOUNTER — Telehealth: Payer: Self-pay

## 2017-08-25 ENCOUNTER — Encounter: Payer: Self-pay | Admitting: Pharmacist

## 2017-08-25 DIAGNOSIS — J45909 Unspecified asthma, uncomplicated: Secondary | ICD-10-CM

## 2017-08-25 DIAGNOSIS — R9389 Abnormal findings on diagnostic imaging of other specified body structures: Secondary | ICD-10-CM

## 2017-08-25 NOTE — Progress Notes (Signed)
PA submitted for diclofenac gel. Pending approval.

## 2017-08-25 NOTE — Telephone Encounter (Signed)
Contacted pt to go over xray results and lab results pt is aware and doesn't have any questions or concerns

## 2017-08-27 NOTE — Progress Notes (Signed)
PA approved for diclofenac gel through 07/05/2018

## 2017-08-30 ENCOUNTER — Telehealth: Payer: Self-pay | Admitting: Internal Medicine

## 2017-08-30 ENCOUNTER — Other Ambulatory Visit: Payer: Self-pay | Admitting: Internal Medicine

## 2017-08-30 DIAGNOSIS — I10 Essential (primary) hypertension: Secondary | ICD-10-CM

## 2017-08-30 DIAGNOSIS — N182 Chronic kidney disease, stage 2 (mild): Secondary | ICD-10-CM

## 2017-08-30 DIAGNOSIS — E1122 Type 2 diabetes mellitus with diabetic chronic kidney disease: Secondary | ICD-10-CM

## 2017-08-30 MED ORDER — ALBUTEROL SULFATE (2.5 MG/3ML) 0.083% IN NEBU: INHALATION_SOLUTION | RESPIRATORY_TRACT | 0 refills | Status: DC

## 2017-08-30 NOTE — Telephone Encounter (Signed)
Refilled

## 2017-08-30 NOTE — Telephone Encounter (Signed)
Pt. Called requesting a refill on the solution for her nebulizer. Pt. Uses [facility] on [address]. Please f/u

## 2017-09-01 NOTE — Telephone Encounter (Signed)
Error

## 2017-09-06 ENCOUNTER — Ambulatory Visit: Payer: Medicare Other | Attending: Internal Medicine | Admitting: Internal Medicine

## 2017-09-06 ENCOUNTER — Encounter: Payer: Self-pay | Admitting: Internal Medicine

## 2017-09-06 ENCOUNTER — Other Ambulatory Visit: Payer: Self-pay | Admitting: Internal Medicine

## 2017-09-06 VITALS — BP 128/79 | HR 93 | Temp 98.2°F | Resp 16 | Ht 64.0 in | Wt 227.2 lb

## 2017-09-06 DIAGNOSIS — J4530 Mild persistent asthma, uncomplicated: Secondary | ICD-10-CM | POA: Diagnosis not present

## 2017-09-06 DIAGNOSIS — E1165 Type 2 diabetes mellitus with hyperglycemia: Secondary | ICD-10-CM | POA: Diagnosis not present

## 2017-09-06 DIAGNOSIS — K219 Gastro-esophageal reflux disease without esophagitis: Secondary | ICD-10-CM | POA: Insufficient documentation

## 2017-09-06 DIAGNOSIS — N183 Chronic kidney disease, stage 3 (moderate): Secondary | ICD-10-CM | POA: Insufficient documentation

## 2017-09-06 DIAGNOSIS — IMO0001 Reserved for inherently not codable concepts without codable children: Secondary | ICD-10-CM

## 2017-09-06 DIAGNOSIS — S01312A Laceration without foreign body of left ear, initial encounter: Secondary | ICD-10-CM

## 2017-09-06 DIAGNOSIS — Z79899 Other long term (current) drug therapy: Secondary | ICD-10-CM | POA: Insufficient documentation

## 2017-09-06 DIAGNOSIS — E1122 Type 2 diabetes mellitus with diabetic chronic kidney disease: Secondary | ICD-10-CM | POA: Diagnosis not present

## 2017-09-06 DIAGNOSIS — Z794 Long term (current) use of insulin: Secondary | ICD-10-CM | POA: Diagnosis not present

## 2017-09-06 DIAGNOSIS — Z9841 Cataract extraction status, right eye: Secondary | ICD-10-CM | POA: Diagnosis not present

## 2017-09-06 DIAGNOSIS — I130 Hypertensive heart and chronic kidney disease with heart failure and stage 1 through stage 4 chronic kidney disease, or unspecified chronic kidney disease: Secondary | ICD-10-CM | POA: Insufficient documentation

## 2017-09-06 DIAGNOSIS — F329 Major depressive disorder, single episode, unspecified: Secondary | ICD-10-CM | POA: Diagnosis not present

## 2017-09-06 DIAGNOSIS — E785 Hyperlipidemia, unspecified: Secondary | ICD-10-CM | POA: Diagnosis not present

## 2017-09-06 DIAGNOSIS — M170 Bilateral primary osteoarthritis of knee: Secondary | ICD-10-CM | POA: Diagnosis not present

## 2017-09-06 DIAGNOSIS — I509 Heart failure, unspecified: Secondary | ICD-10-CM | POA: Insufficient documentation

## 2017-09-06 DIAGNOSIS — Z833 Family history of diabetes mellitus: Secondary | ICD-10-CM | POA: Diagnosis not present

## 2017-09-06 DIAGNOSIS — Z7982 Long term (current) use of aspirin: Secondary | ICD-10-CM | POA: Insufficient documentation

## 2017-09-06 DIAGNOSIS — E669 Obesity, unspecified: Secondary | ICD-10-CM | POA: Diagnosis not present

## 2017-09-06 DIAGNOSIS — Z800 Family history of malignant neoplasm of digestive organs: Secondary | ICD-10-CM | POA: Insufficient documentation

## 2017-09-06 DIAGNOSIS — F32A Depression, unspecified: Secondary | ICD-10-CM | POA: Insufficient documentation

## 2017-09-06 DIAGNOSIS — Z90710 Acquired absence of both cervix and uterus: Secondary | ICD-10-CM | POA: Insufficient documentation

## 2017-09-06 DIAGNOSIS — F22 Delusional disorders: Secondary | ICD-10-CM | POA: Insufficient documentation

## 2017-09-06 DIAGNOSIS — X58XXXA Exposure to other specified factors, initial encounter: Secondary | ICD-10-CM | POA: Diagnosis not present

## 2017-09-06 DIAGNOSIS — E119 Type 2 diabetes mellitus without complications: Secondary | ICD-10-CM | POA: Diagnosis present

## 2017-09-06 DIAGNOSIS — Z6839 Body mass index (BMI) 39.0-39.9, adult: Secondary | ICD-10-CM | POA: Diagnosis not present

## 2017-09-06 DIAGNOSIS — M5030 Other cervical disc degeneration, unspecified cervical region: Secondary | ICD-10-CM | POA: Diagnosis not present

## 2017-09-06 DIAGNOSIS — Z9842 Cataract extraction status, left eye: Secondary | ICD-10-CM | POA: Insufficient documentation

## 2017-09-06 DIAGNOSIS — G4733 Obstructive sleep apnea (adult) (pediatric): Secondary | ICD-10-CM | POA: Diagnosis not present

## 2017-09-06 DIAGNOSIS — F419 Anxiety disorder, unspecified: Secondary | ICD-10-CM | POA: Insufficient documentation

## 2017-09-06 DIAGNOSIS — J4520 Mild intermittent asthma, uncomplicated: Secondary | ICD-10-CM

## 2017-09-06 LAB — GLUCOSE, POCT (MANUAL RESULT ENTRY): POC Glucose: 263 mg/dl — AB (ref 70–99)

## 2017-09-06 LAB — POCT GLYCOSYLATED HEMOGLOBIN (HGB A1C): Hemoglobin A1C: 11.8

## 2017-09-06 MED ORDER — SERTRALINE HCL 50 MG PO TABS: ORAL_TABLET | ORAL | 0 refills | Status: DC

## 2017-09-06 MED ORDER — BUDESONIDE-FORMOTEROL FUMARATE 80-4.5 MCG/ACT IN AERO: 2.0000 | INHALATION_SPRAY | Freq: Two times a day (BID) | RESPIRATORY_TRACT | 5 refills | Status: DC

## 2017-09-06 MED ORDER — INSULIN LISPRO PROT & LISPRO (75-25 MIX) 100 UNIT/ML ~~LOC~~ SUSP: SUBCUTANEOUS | 11 refills | Status: DC
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
## 2017-09-06 NOTE — Patient Instructions (Addendum)
Increase insulin to 80 units in mornings and 55 units in the evenings with meals.  You have been referred to an endocrinologist.  I have enclosed some information on healthy eating habits.  Start Symbicort inhaler.  Please rinse mouth after each use.  Keep appointment with the pulmonary specialist.   Start Zoloft to help with depression/anxiety as discussed.  If you change your mind about wanting to see a therapist or psychiatrist, please let me know.

## 2017-09-08 ENCOUNTER — Ambulatory Visit (INDEPENDENT_AMBULATORY_CARE_PROVIDER_SITE_OTHER): Payer: Medicare Other | Admitting: Orthopedic Surgery

## 2017-09-09 ENCOUNTER — Encounter: Payer: Self-pay | Admitting: Neurology

## 2017-09-09 ENCOUNTER — Encounter: Payer: Self-pay | Admitting: Internal Medicine

## 2017-09-09 ENCOUNTER — Telehealth: Payer: Self-pay | Admitting: Neurology

## 2017-09-09 ENCOUNTER — Encounter (INDEPENDENT_AMBULATORY_CARE_PROVIDER_SITE_OTHER): Payer: Self-pay

## 2017-09-09 ENCOUNTER — Ambulatory Visit (INDEPENDENT_AMBULATORY_CARE_PROVIDER_SITE_OTHER): Payer: Medicare Other | Admitting: Internal Medicine

## 2017-09-09 ENCOUNTER — Ambulatory Visit (INDEPENDENT_AMBULATORY_CARE_PROVIDER_SITE_OTHER): Payer: Medicare Other | Admitting: Neurology

## 2017-09-09 VITALS — BP 123/62 | HR 86

## 2017-09-09 VITALS — BP 140/82 | HR 93 | Ht 64.0 in | Wt 228.0 lb

## 2017-09-09 DIAGNOSIS — I10 Essential (primary) hypertension: Secondary | ICD-10-CM

## 2017-09-09 DIAGNOSIS — J45991 Cough variant asthma: Secondary | ICD-10-CM | POA: Insufficient documentation

## 2017-09-09 DIAGNOSIS — G43709 Chronic migraine without aura, not intractable, without status migrainosus: Secondary | ICD-10-CM | POA: Diagnosis not present

## 2017-09-09 LAB — NITRIC OXIDE: Nitric Oxide: 8

## 2017-09-09 MED ORDER — NIZATIDINE 150 MG PO CAPS: 150.0000 mg | ORAL_CAPSULE | Freq: Every day | ORAL | 2 refills | Status: DC

## 2017-09-09 MED ORDER — LOSARTAN POTASSIUM 50 MG PO TABS: 50.0000 mg | ORAL_TABLET | Freq: Every day | ORAL | 11 refills | Status: DC

## 2017-09-09 MED ORDER — PANTOPRAZOLE SODIUM 40 MG PO TBEC: 40.0000 mg | DELAYED_RELEASE_TABLET | Freq: Every day | ORAL | 2 refills | Status: DC

## 2017-09-09 NOTE — Progress Notes (Signed)
Interval history: Doing exceptionally well.  >50% improvement in frequency. The migraines she does have are easier to treat and >50% less severe.  Consent Form Botulism Toxin Injection For Chronic Migraine  Botulism toxin has been approved by the Federal drug administration for treatment of chronic migraine. Botulism toxin does not cure chronic migraine and it may not be effective in some patients.  The administration of botulism toxin is accomplished by injecting a small amount of toxin into the muscles of the neck and head. Dosage must be titrated for each individual. Any benefits resulting from botulism toxin tend to wear off after 3 months with a repeat injection required if benefit is to be maintained. Injections are usually done every 3-4 months with maximum effect peak achieved by about 2 or 3 weeks. Botulism toxin is expensive and you should be sure of what costs you will incur resulting from the injection.  The side effects of botulism toxin use for chronic migraine may include:   -Transient, and usually mild, facial weakness with facial injections  -Transient, and usually mild, head or neck weakness with head/neck injections  -Reduction or loss of forehead facial animation due to forehead muscle              weakness  -Eyelid drooping  -Dry eye  -Pain at the site of injection or bruising at the site of injection  -Double vision  -Potential unknown long term risks  Contraindications: You should not have Botox if you are pregnant, nursing, allergic to albumin, have an infection, skin condition, or muscle weakness at the site of the injection, or have myasthenia gravis, Lambert-Eaton syndrome, or ALS.  It is also possible that as with any injection, there may be an allergic reaction or no effect from the medication. Reduced effectiveness after repeated injections is sometimes seen and rarely infection at the injection site may occur. All care will be taken to prevent these side  effects. If therapy is given over a long time, atrophy and wasting in the muscle injected may occur. Occasionally the patient's become refractory to treatment because they develop antibodies to the toxin. In this event, therapy needs to be modified.  I have read the above information and consent to the administration of botulism toxin.    ______________  _____   _________________  Patient signature     Date   Witness signature       BOTOX PROCEDURE NOTE FOR MIGRAINE HEADACHE    Contraindications and precautions discussed with patient(above). Aseptic procedure was observed and patient tolerated procedure. Procedure performed by Dr. Georgia Dom  The condition has existed for more than 6 months, and pt does not have a diagnosis of ALS, Myasthenia Gravis or Lambert-Eaton Syndrome. Risks and benefits of injections discussed and pt agrees to proceed with the procedure. Written consent obtained  These injections are medically necessary. He receives good benefits from these injections. These injections do not cause sedations or hallucinations which the oral therapies may cause.  Indication/Diagnosis: chronic migraine BOTOX(J0585) injection was performed according to protocol by [employer]. 200 units of BOTOX was dissolved into 4 cc NS.  NDC: [address]-78  Description of procedure:  The patient was placed in a sitting position. The standard protocol was used for Botox as follows, with 5 units of Botox injected at each site:   -Procerus muscle, midline injection  -Corrugator muscle, bilateral injection  -Frontalis muscle, bilateral injection, with 2 sites each side, medial injection was performed in the upper one third  of the frontalis muscle, in the region vertical from the medial inferior edge of the superior orbital rim. The lateral injection was again in the upper one third of the forehead vertically above the lateral limbus of the cornea, 1.5 cm lateral to the medial injection  site.  -Temporalis muscle injection, 4 sites, bilaterally. The first injection was 3 cm above the tragus of the ear, second injection site was 1.5 cm to 3 cm up from the first injection site in line with the tragus of the ear. The third injection site was 1.5-3 cm forward between the first 2 injection sites. The fourth injection site was 1.5 cm posterior to the second injection site.  -Occipitalis muscle injection, 3 sites, bilaterally. The first injection was done one half way between the occipital protuberance and the tip of the mastoid process behind the ear. The second injection site was done lateral and superior to the first, 1 fingerbreadth from the first injection. The third injection site was 1 fingerbreadth superiorly and medially from the first injection site.  -Cervical paraspinal muscle injection, 2 sites, bilateral knee first injection site was 1 cm from the midline of the cervical spine, 3 cm inferior to the lower border of the occipital protuberance. The second injection site was 1.5 cm superiorly and laterally to the first injection site.  -Trapezius muscle injection was performed at 3 sites, bilaterally. The first injection site was in the upper trapezius muscle halfway between the inflection point of the neck, and the acromion. The second injection site was one half way between the acromion and the first injection site. The third injection was done between the first injection site and the inflection point of the neck.   Will return for repeat injection in 3 months.   A 200 unit sof Botox was used, 155 units were injected, the rest of the Botox was wasted. The patient tolerated the procedure well, there were no complications of the above procedure.

## 2017-09-09 NOTE — Progress Notes (Signed)
Botox- 100 units x 2 vials Lot: [id number] Expiration: 02/2020 NDC: 7048-8891-69  Bacteriostatic 0.9% Sodium Chloride- 43mL total Lot: [id number] Expiration: 11/04/2018 NDC: 8280-0349-17  Dx: H15.056 B/B //BCrn

## 2017-09-09 NOTE — Patient Instructions (Addendum)
Stop lisinopril and start losartan 50 mg one daily  your breathing problems should gradually resolve over the next few weeks    Plan B = Backup Only use your albuterol as a rescue medication to be used if you can't catch your breath by resting or doing a relaxed purse lip breathing pattern.  - The less you use it, the better it will work when you need it. - Ok to use the inhaler up to 2 puffs  every 4 hours if you must but call for appointment if use goes up over your usual need - Don't leave home without it !!  (think of it like the spare tire for your car)   Plan C = Crisis - only use your albuterol nebulizer if you first try Plan B and it fails to help > ok to use the nebulizer up to every 4 hours but if start needing it regularly call for immediate appointment      GERD (REFLUX)  is an extremely common cause of respiratory symptoms just like yours , many times with no obvious heartburn at all.    It can be treated with medication, but also with lifestyle changes including elevation of the head of your bed (ideally with 6 inch  bed blocks),  Smoking cessation, avoidance of late meals, excessive alcohol, and avoid fatty foods, chocolate, peppermint, colas, red wine, and acidic juices such as orange juice.  NO MINT OR MENTHOL PRODUCTS SO NO COUGH DROPS   USE SUGARLESS CANDY INSTEAD (Jolley ranchers or Stover's or Life Savers) or even ice chips will also do - the key is to swallow to prevent all throat clearing. NO OIL BASED VITAMINS - use powdered substitutes.  Pantoprazole (protonix) 40 mg(or Prilosec 40 mg)   Take  30-60 min before first meal of the day and zantac (Ranitidine)  150  mg one hour before  bedtime until return to office - this is the best way to tell whether stomach acid is contributing to your problem.     Please schedule a follow up office visit in 6 weeks, call sooner if needed - do not use inhalers that day

## 2017-09-09 NOTE — Telephone Encounter (Signed)
Pt. Needs 12 wk Botox inj  °

## 2017-09-09 NOTE — Progress Notes (Signed)
Subjective:     Patient ID: Heidi [name], female   DOB: 1950-11-07      MRN: [id number]  HPI  28 yobf never smoker healthy child/ adolescent with last IUP age 67  around 5  with baseline wt around 170 good activity tol but no aerobics then gradual wt gain assoc with hbp and placed on ACEi at some point   with onset sob and freq "colds"  2015  >  eval by [place] pulmonary doctor dx asthma > symbicort / albuterol not helpful  So referred to pulmonary clinic 09/09/2017 by Dr   Karle Plumber.  09/09/2017 1st [facility] office visit/ Zyara Riling  On acei since at least 2015 pulmonary ov per records  Chief Complaint  Patient presents with  . Pulmonary Consult    Referred by Dr. Wynetta Emery. Pt states dxed with PNA 08/14/17. She c/o PND and cough is non prod She is using her albuterol inhaler 3 x daily and rarely uses her neb.   Sleeps ok on 2 pillows but can't walk 50 ft s sob = baseline x years assoc with dry hacking cough/ min improvement on saba which she used w/in 2 h of ov "just couldn't get to the office without is (note very poor hfa)  - neb doesn't work much better    No obvious day to day or daytime variability or assoc excess/ purulent sputum or mucus plugs or hemoptysis or cp or chest tightness, subjective wheeze or overt sinus or hb symptoms. No unusual exposure hx or h/o childhood pna/ asthma or knowledge of premature birth.  Sleeping ok 2 pillows without nocturnal  or early am exacerbation  of respiratory  c/o's or need for noct saba. Also denies any obvious fluctuation of symptoms with weather or environmental changes or other aggravating or alleviating factors except as outlined above   Current Allergies, Complete Past Medical History, Past Surgical History, Family History, and Social History were reviewed in [employer] record.  ROS  The following are not active complaints unless bolded Hoarseness, sore throat, dysphagia, dental problems, itching, sneezing,  nasal  congestion or discharge of excess mucus or purulent secretions, ear aches   fever, chills, sweats, unintended wt loss or wt gain, classically pleuritic or exertional cp,  orthopnea pnd or leg swelling, presyncope, palpitations, abdominal pain, anorexia, nausea, vomiting, diarrhea  or change in bowel habits or change in bladder habits, change in stools or change in urine, dysuria, hematuria,  rash, arthralgias, visual complaints, headache, numbness, weakness or ataxia or problems with walking or coordination,  change in mood/affect or memory.        Current Meds  Medication Sig  . albuterol (PROVENTIL HFA;VENTOLIN HFA) 108 (90 Base) MCG/ACT inhaler INHALE 2 PUFFS INTO THE LUNGS EVERY 6 HOURS AS NEEDED FOR WHEEZING OR SHORTNESS OF BREATH  . albuterol (PROVENTIL) (2.5 MG/3ML) 0.083% nebulizer solution INHALE CONTENTS OF 1 VIAL VIA NEBULIZER EVERY 6 HOURS AS NEEDED FOR WHEEZING OR SHORTNESS OF BREATH  . Ascorbic Acid (VITAMIN C PO) Take 1 tablet by mouth daily.  Marland Kitchen aspirin 81 MG chewable tablet Chew 81 mg by mouth daily.   Marland Kitchen atenolol (TENORMIN) 25 MG tablet Take 1 tablet (25 mg total) by mouth daily.  Marland Kitchen atorvastatin (LIPITOR) 40 MG tablet TAKE 1 TABLET BY MOUTH DAILY AT 6 PM  . bumetanide (BUMEX) 0.5 MG tablet Take 1 tablet (0.5 mg total) by mouth daily.  . calcium-vitamin D (OSCAL WITH D) 500-200 MG-UNIT tablet Take 1 tablet by  mouth daily.  Marland Kitchen conjugated estrogens (PREMARIN) vaginal cream Insert 0.5 grams vaginally once a wk.  . diclofenac sodium (VOLTAREN) 1 % GEL Apply 2 g topically 4 (four) times daily.  Marland Kitchen glucose blood (FREESTYLE TEST STRIPS) test strip Use as instructed for 3 times daily testing of blood glucose.  . hydrochlorothiazide (HYDRODIURIL) 25 MG tablet Take 1 tablet (25 mg total) by mouth daily.  . insulin lispro protamine-lispro (HUMALOG 75/25 MIX) (75-25) 100 UNIT/ML SUSP injection Inject subcutaneously 80 U in the morning with food and 55 U at in evening with meals  . INSULIN SYRINGE  1CC/29G 29G X 1/2" 1 ML MISC USE THREE TIMES DAILY AS DIRECTED (Patient taking differently: USE TWO TIMES DAILY AS DIRECTED)  . Lancets (FREESTYLE) lancets Use as directed 3 times daily. E11.9  . lisinopril (PRINIVIL,ZESTRIL) 5 MG tablet TAKE 1 TABLET(5 MG) BY MOUTH DAILY  . montelukast (SINGULAIR) 10 MG tablet TAKE 1 TABLET(10 MG) BY MOUTH DAILY  . Multiple Vitamin (MULTIVITAMIN WITH MINERALS) TABS tablet Take 1 tablet by mouth daily.  . nitroGLYCERIN (NITROSTAT) 0.3 MG SL tablet Place 1 tablet (0.3 mg total) under the tongue every 5 (five) minutes as needed for chest pain. Max 3 tablets in 15 minutes  . omeprazole (PRILOSEC) 40 MG capsule TAKE ONE CAPSULE BY MOUTH DAILY  . ondansetron (ZOFRAN) 4 MG tablet Take 1 tablet (4 mg total) by mouth every 6 (six) hours.  . Potassium Chloride ER 20 MEQ TBCR Take 20 mEq by mouth daily.  . sertraline (ZOLOFT) 50 MG tablet 1/2 tab daily x 2 wks then 1 tab daily.  Marland Kitchen topiramate (TOPAMAX) 25 MG capsule Take 3 capsules (75 mg total) by mouth 2 (two) times daily.  . traMADol (ULTRAM) 50 MG tablet Take 1 tablet (50 mg total) by mouth every 8 (eight) hours as needed.         Review of Systems     Objective:   Physical Exam   amb hoarse obese bf nad/ very slow mentation/ responses / classic pseudowheeze  Wt Readings from Last 3 Encounters:  09/09/17 228 lb (103.4 kg)  09/06/17 227 lb 3.2 oz (103.1 kg)  08/23/17 227 lb 12.8 oz (103.3 kg)     Vital signs reviewed - Note on arrival 02 sats  93% on RA      HEENT: nl dentition, turbinates bilaterally, and oropharynx. Nl external ear canals without cough reflex   NECK :  without JVD/Nodes/TM/ nl carotid upstrokes bilaterally   LUNGS: no acc muscle use,  Nl contour chest which is clear to A and P bilaterally without cough on insp or exp maneuvers   CV:  RRR  no s3 or murmur or increase in P2, and no edema   ABD:  Quite obese but soft and nontender with limited inspiratory excursion in the supine  position. No bruits or organomegaly appreciated, bowel sounds nl  MS:  Nl gait/ ext warm without deformities, calf tenderness, cyanosis or clubbing No obvious joint restrictions   SKIN: warm and dry without lesions    NEURO:  alert, approp, nl sensorium with  no motor or cerebellar deficits apparent.      I personally reviewed images and agree with radiology impression as follows:  CXR:   08/24/17  1. Focal left lower lobe opacity on comparison study is resolved. 2. Baseline appearance of the chest with chronic low lung volumes and interstitial opacity.     Assessment:

## 2017-09-10 ENCOUNTER — Encounter: Payer: Self-pay | Admitting: Internal Medicine

## 2017-09-10 NOTE — Assessment & Plan Note (Addendum)
- pfts 12/12/13 done while sympt on ACEi should no obst or flattening of f/v curve - Spirometry 09/09/2017  FEV1 0.87 (44%)  Ratio 78 with min curvature w/in 2 h of study with symptoms at time of study  - FENO 09/09/2017  =   8  - trial off acei 09/09/2017    DDX of  Chronic/ refractory difficult airways management almost all start with A and  include Adherence, Ace Inhibitors, Acid Reflux, Active Sinus Disease, Alpha 1 Antitripsin deficiency, Anxiety masquerading as Airways dz,  ABPA,  Allergy(esp in young), Aspiration (esp in elderly), Adverse effects of meds,  Active smokers, A bunch of PE's (a small clot burden can't cause this syndrome unless there is already severe underlying pulm or vascular dz with poor reserve) plus two Bs  = Bronchiectasis and Beta blocker use..and one C= CHF    .Adherence is always the initial "prime suspect" and is a multilayered concern that requires a "trust but verify" approach in every patient - starting with knowing how to use medications, especially inhalers, correctly, keeping up with refills and understanding the fundamental difference between maintenance and prns vs those medications only taken for a very short course and then stopped and not refilled.  - - The proper method of use, as well as anticipated side effects, of a metered-dose inhaler are discussed and demonstrated to the patient. Improved effectiveness after extensive coaching during this visit to a level of approximately 50 % from a baseline of nearly % 0 making asthma resp to saba unlikely - return Adherence is always the initial "prime suspect" and is a multilayered concern that requires a "trust but verify" approach in every patient - starting with knowing how to use medications, especially inhalers, correctly, keeping up with refills and understanding the fundamental difference between maintenance and prns vs those medications only taken for a very short course and then stopped and not refilled.    ACEi  adverse effects at the  top of the usual list of suspects and the only way to rule it out is a trial off > see a/p    ? Acid (or non-acid) GERD > always difficult to exclude as up to 75% of pts in some series report no assoc GI/ Heartburn symptoms> rec max (24h)  acid suppression and diet restrictions/ reviewed and instructions given in writing.   ? Anxiety > usually at the bottom of this list of usual suspects but should be much higher on this pt's based on H and P and note already on psychotropics .  ? Allergy > check profile next ov if still symptomatic     ? BB effects > unlikely on low dose tenormin with no residual airflow obst on spirometry after inadequate B agonist rx (would expect to see a lot more refractory obst)  ? Chf/ cardiac asthma still in ddx / consider eval if symptoms remain refractory   F/u in 6 weeks off acei to regroup with no saba that day if possible w/in 4 h prior to ov     Total time devoted to counseling  > 50 % of initial 60 min office visit:  review case with pt/ discussion of options/alternatives/ personally creating written customized instructions  in presence of pt  then going over those specific  Instructions directly with the pt including how to use all of the meds but in particular covering each new medication in detail and the difference between the maintenance= "automatic" meds and the prns using an action  plan format for the latter (If this problem/symptom => do that organization reading Left to right).  Please see AVS from this visit for a full list of these instructions which I personally wrote for this pt and  are unique to this visit.

## 2017-09-10 NOTE — Assessment & Plan Note (Signed)
Body mass index is 39.14 kg/m.  -  Lab Results  Component Value Date   TSH 0.627 11/26/2014     Contributing to gerd risk/ doe with  restrictive changes seen on pfts/reviewed the need and the process to achieve and maintain neg calorie balance > defer f/u primary care including intermittently monitoring thyroid status

## 2017-09-10 NOTE — Assessment & Plan Note (Signed)
ACE inhibitors are problematic in  pts with airway complaints because  even experienced pulmonologists (note she saw one in [place] in [place] while one acei with same problem as now)  can't always distinguish ace effects from copd/asthma.  By themselves they don't actually cause a problem, much like oxygen can't by itself start a fire, but they certainly serve as a powerful catalyst or enhancer for any "fire"  or inflammatory process in the upper airway, be it caused by an ET  tube or more commonly reflux (especially in the obese or pts with known GERD or who are on biphoshonates).    In the era of ARB near equivalency until we have a better handle on the reversibility of the airway problem, it just makes sense to avoid ACEI  entirely in the short run and then decide later, having established a level of airway control using a reasonable limited regimen, whether to add back ace but even then being very careful to observe the pt for worsening airway control and number of meds used/ needed to control symptoms.    Try losartan 50 mg daily

## 2017-09-14 ENCOUNTER — Ambulatory Visit (INDEPENDENT_AMBULATORY_CARE_PROVIDER_SITE_OTHER): Payer: Medicare Other | Admitting: Orthopedic Surgery

## 2017-09-14 ENCOUNTER — Ambulatory Visit (INDEPENDENT_AMBULATORY_CARE_PROVIDER_SITE_OTHER): Payer: Medicare Other

## 2017-09-14 ENCOUNTER — Encounter (INDEPENDENT_AMBULATORY_CARE_PROVIDER_SITE_OTHER): Payer: Self-pay | Admitting: Orthopedic Surgery

## 2017-09-14 DIAGNOSIS — M25572 Pain in left ankle and joints of left foot: Secondary | ICD-10-CM

## 2017-09-14 DIAGNOSIS — M25562 Pain in left knee: Secondary | ICD-10-CM

## 2017-09-15 ENCOUNTER — Encounter (INDEPENDENT_AMBULATORY_CARE_PROVIDER_SITE_OTHER): Payer: Self-pay | Admitting: Orthopedic Surgery

## 2017-09-15 NOTE — Progress Notes (Signed)
Office Visit Note   Patient: Heidi [name]           Date of Birth: 1951-04-01           MRN: [id number] Visit Date: 09/14/2017 Requested by: Ladell Pier, MD [place], [place] [id number] PCP: Ladell Pier, MD  Subjective: Chief Complaint  Patient presents with  . Left Knee - Pain    HPI: Heidi [name] is a patient with left knee and leg pain.  She had an injury 08/14/2017.  She was trying to get off of an ambulance because it was transporting her to the hospital for evaluation for pneumonia.  She states that she was not adequately supported when she was getting off the ambulance and she sustained an axial load to that left leg.  Had difficulty walking after that in the immediate post injury phase.  Radiographs done at the time were negative for fracture.  I reviewed the radiographs from 08/14/2017 and they do show some mild lateral compartment arthritis but no acute fracture.  She also had radiographs done on 08/09/2017 when she was being evaluated for knee pain and locking.  No change in appearance of the left knee radiographs from 08/09/17 to 08/14/17.  Patient had left ankle surgery 1993 for fracture.  She is done well with that for many years but is having some ankle pain associated with this jarring incident.  She has been taking Norco for the problem.  She does have a history of having prior issues with the knee.  She has had injections by her report in 2016 and also did flex agenic clinic injections in September 2016.  She is also taking tramadol.  Immediately after her incident on 08/14/2017 she could not really walk or stand.  Since that time she has improved but reports continued painful symptoms in the knee.              ROS: All systems reviewed are negative as they relate to the chief complaint within the history of present illness.  Patient denies  fevers or chills.   Assessment & Plan: Visit Diagnoses:  1. Left knee pain, unspecified chronicity   2. Pain in left ankle  and joints of left foot     Plan: Impression is aggravation of existing left knee arthritis following an axial load to the knee.  Collateral and cruciate ligaments feel stable.  There is no real effusion in the knee.  She is been taking medication.  Her ankle radiographs look good as well.  I would favor left knee MRI scan to evaluate for bone bruising.  Fairly high incidence of false positive results for scanning done on patient's and her age group.  This would include asymptomatic degenerative meniscal tears as well as degenerative arthritis.  I think the MRI scan at this time could be utilized to see if there is any acute type injury to the knee.  We will see what that shows and proceed from there.  Patient is requesting pain medication which I do not want to prescribe at this time because she is already on an opioid (tramadol).  Do not really want to go any stronger with that at this time based on the potential for long-term problem with this aggravating injury of the knee.  Follow-Up Instructions: Return for after MRI.   Orders:  Orders Placed This Encounter  Procedures  . MR Knee Left w/o contrast  . XR Ankle Complete Left   No orders of  the defined types were placed in this encounter.     Procedures: No procedures performed   Clinical Data: No additional findings.  Objective: Vital Signs: There were no vitals taken for this visit.  Physical Exam:   Constitutional: Patient appears well-developed HEENT:  Head: Normocephalic Eyes:EOM are normal Neck: Normal range of motion Cardiovascular: Normal rate Pulmonary/chest: Effort normal Neurologic: Patient is alert Skin: Skin is warm Psychiatric: Patient has normal mood and affect    Ortho Exam: Orthopedic exam demonstrates antalgic gait to the left.  There is no knee effusion.  Collateral and cruciate ligaments are stable on the left and right knee.  Extensor mechanism is intact.  Pedal pulses palpable.  Ankle range of motion  is also pretty reasonable with tibiotalar subtalar and transverse tarsal range of motion with palpable intact nontender anterior to posterior to peroneal and Achilles tendons.  Knee has good flexion past 90.  No real focal joint line tenderness is present but just more of a global discomfort is described.  Specialty Comments:  No specialty comments available.  Imaging: No results found.   PMFS History: Patient Active Problem List   Diagnosis Date Noted  . Morbid obesity due to excess calories ([place]) complicated by hbp/ dm / hyperlipidemia 09/10/2017  . Cough variant asthma vs UACS/vcd on ACEi  09/09/2017  . Anxiety and depression 09/06/2017  . Mild persistent asthma without complication 44/81/8563  . Paranoia ([place]) 09/06/2017  . Torn left ear lobe 11/20/2016  . Chronic migraine w/o aura w/o status migrainosus, not intractable 06/27/2016  . Post-menopausal atrophic vaginitis 11/14/2015  . Primary osteoarthritis of both knees 07/18/2015  . De Quervain's tenosynovitis, right 05/08/2015  . GERD (gastroesophageal reflux disease) 05/08/2015  . Renal cyst, right 05/08/2015  . Adenomatous polyp of colon 03/26/2015  . Essential hypertension 02/12/2015  . Cognitive deficit due to old head trauma 02/12/2015  . Asthma, chronic 02/12/2015  . Chronic left shoulder pain 02/11/2015  . Thyroid nodule 12/18/2014  . Chronic neck pain   . HLD (hyperlipidemia)   . OSA treated with BiPAP 11/27/2014  . Obesity hypoventilation syndrome ([place]) 11/27/2014  . Demand ischemia ([place])   . Diabetes type 2, uncontrolled ([place])   . Cardiomegaly 11/25/2014  . CHF (congestive heart failure) ([place]) 11/25/2014  . DM type 2 causing CKD stage 2 ([place]) 11/25/2014   Past Medical History:  Diagnosis Date  . Adenomatous colon polyp   . Anemia   . Anxiety   . Asthma   . CHF (congestive heart failure) ([place])   . Diabetes mellitus without complication ([place])   . High cholesterol   . Hypertension   . Left knee injury     . Pneumonia     Family History  Problem Relation Age of Onset  . Colon cancer Father   . Diabetes type II Sister   . Diabetes type II Brother   . Colon cancer Sister   . Migraines Neg Hx     Past Surgical History:  Procedure Laterality Date  . ABDOMINAL HYSTERECTOMY    . CATARACT EXTRACTION Left   . CATARACT EXTRACTION     LT 01/2017, 02/2017 on RT  . CESAREAN SECTION     Social History   Occupational History  . Occupation: Disabled  Tobacco Use  . Smoking status: Never Smoker  . Smokeless tobacco: Never Used  Substance and Sexual Activity  . Alcohol use: No  . Drug use: No  . Sexual activity: Not on file

## 2017-09-15 NOTE — Telephone Encounter (Signed)
Called and left patient a message stating  To call the off back to schedule her Botox apt after June 7 th .

## 2017-09-15 NOTE — Telephone Encounter (Signed)
Pt returning call to schedule Botox appt

## 2017-09-15 NOTE — Telephone Encounter (Signed)
Noted, thank you

## 2017-09-16 ENCOUNTER — Other Ambulatory Visit: Payer: Self-pay | Admitting: Pharmacist

## 2017-09-16 DIAGNOSIS — M25561 Pain in right knee: Secondary | ICD-10-CM | POA: Diagnosis not present

## 2017-09-16 DIAGNOSIS — M170 Bilateral primary osteoarthritis of knee: Secondary | ICD-10-CM | POA: Diagnosis not present

## 2017-09-16 DIAGNOSIS — I10 Essential (primary) hypertension: Secondary | ICD-10-CM

## 2017-09-16 DIAGNOSIS — I5033 Acute on chronic diastolic (congestive) heart failure: Secondary | ICD-10-CM

## 2017-09-16 DIAGNOSIS — M25562 Pain in left knee: Secondary | ICD-10-CM | POA: Diagnosis not present

## 2017-09-16 DIAGNOSIS — R269 Unspecified abnormalities of gait and mobility: Secondary | ICD-10-CM | POA: Diagnosis not present

## 2017-09-16 MED ORDER — POTASSIUM CHLORIDE ER 20 MEQ PO TBCR: 20.0000 meq | EXTENDED_RELEASE_TABLET | Freq: Every day | ORAL | 0 refills | Status: DC

## 2017-09-16 NOTE — Telephone Encounter (Signed)
I called patient to schedule, she stated that she was about to go into another Dr. Vertis Kelch and she would have to call me back.

## 2017-09-17 ENCOUNTER — Ambulatory Visit (HOSPITAL_BASED_OUTPATIENT_CLINIC_OR_DEPARTMENT_OTHER): Payer: Medicare Other | Admitting: Physical Medicine & Rehabilitation

## 2017-09-17 ENCOUNTER — Encounter: Payer: Medicare Other | Attending: Physical Medicine & Rehabilitation

## 2017-09-17 ENCOUNTER — Encounter: Payer: Self-pay | Admitting: Physical Medicine & Rehabilitation

## 2017-09-17 VITALS — BP 102/63 | HR 76

## 2017-09-17 DIAGNOSIS — G894 Chronic pain syndrome: Secondary | ICD-10-CM

## 2017-09-17 DIAGNOSIS — M545 Low back pain: Secondary | ICD-10-CM | POA: Insufficient documentation

## 2017-09-17 DIAGNOSIS — Z90710 Acquired absence of both cervix and uterus: Secondary | ICD-10-CM | POA: Diagnosis not present

## 2017-09-17 DIAGNOSIS — I509 Heart failure, unspecified: Secondary | ICD-10-CM | POA: Diagnosis not present

## 2017-09-17 DIAGNOSIS — I10 Essential (primary) hypertension: Secondary | ICD-10-CM | POA: Insufficient documentation

## 2017-09-17 DIAGNOSIS — Z9842 Cataract extraction status, left eye: Secondary | ICD-10-CM | POA: Diagnosis not present

## 2017-09-17 DIAGNOSIS — Z9841 Cataract extraction status, right eye: Secondary | ICD-10-CM | POA: Insufficient documentation

## 2017-09-17 DIAGNOSIS — Z800 Family history of malignant neoplasm of digestive organs: Secondary | ICD-10-CM | POA: Diagnosis not present

## 2017-09-17 DIAGNOSIS — Z5181 Encounter for therapeutic drug level monitoring: Secondary | ICD-10-CM | POA: Diagnosis not present

## 2017-09-17 DIAGNOSIS — Z87891 Personal history of nicotine dependence: Secondary | ICD-10-CM | POA: Diagnosis not present

## 2017-09-17 DIAGNOSIS — Z9889 Other specified postprocedural states: Secondary | ICD-10-CM | POA: Diagnosis not present

## 2017-09-17 DIAGNOSIS — J45909 Unspecified asthma, uncomplicated: Secondary | ICD-10-CM | POA: Insufficient documentation

## 2017-09-17 DIAGNOSIS — M47896 Other spondylosis, lumbar region: Secondary | ICD-10-CM | POA: Insufficient documentation

## 2017-09-17 DIAGNOSIS — Z833 Family history of diabetes mellitus: Secondary | ICD-10-CM | POA: Diagnosis not present

## 2017-09-17 DIAGNOSIS — E119 Type 2 diabetes mellitus without complications: Secondary | ICD-10-CM | POA: Diagnosis not present

## 2017-09-17 DIAGNOSIS — Z86010 Personal history of colonic polyps: Secondary | ICD-10-CM | POA: Diagnosis not present

## 2017-09-17 MED ORDER — TRAMADOL HCL 50 MG PO TABS: 50.0000 mg | ORAL_TABLET | Freq: Three times a day (TID) | ORAL | 2 refills | Status: DC | PRN

## 2017-09-17 NOTE — Progress Notes (Signed)
Subjective:    Patient ID: Heidi [name], female    DOB: [birth year], 67 y.o.   MRN: [id number] Neck pain and upper back pain  Patient notes onset around 2016. I have reviewed notes from [facility] in [place]. Patient states that she had been treated for low back pain, although it was her mid and upper back pain that hurt her worse. Epidural at L4-5, reported as no improvement. MRI of lumbar spine August 2015, interpretation reviewed. trace facet effusions L1 2 disc space narrowing without stenosis centrally, L2-3, left-sided stenosis, foraminal  At L2-3. facet effusions L3-4 neural foraminal narrowing, right greater than left at L3-4. no central stenosis. at L4-5 facet joint hypertrophy. foraminal stenosis, moderate to advanced. L5-S1 moderate. hypertrophy of the facet joints. HPI Left knee pain, patient gives a history of injuring this while stepping off the ambulance.  She has been evaluated by orthopedics.  X-rays have been negative.  Orthopedic examination was negative.  An MRI has been ordered but not yet performed. Patient states that her back pain has worsened since the last visit with me on 11/20/2016.  She has had no new injuries to her spine other than she feels that the limp she has developed since injuring her left knee has been aggravating her low back pain. Pain Inventory Average Pain 10 Pain Right Now 9 My pain is burning and aching  In the last 24 hours, has pain interfered with the following? General activity 9 Relation with others 9 Enjoyment of life 9 What TIME of day is your pain at its worst? daytime Sleep (in general) Fair  Pain is worse with: walking, sitting, standing and some activites Pain improves with: rest, heat/ice and medication Relief from Meds: .  Mobility use a cane ability to climb steps?  yes do you drive?  yes  Function retired  Neuro/Psych weakness numbness trouble walking  Prior Studies Any changes since last visit?   no  Physicians involved in your care Any changes since last visit?  no   Family History  Problem Relation Age of Onset  . Colon cancer Father   . Diabetes type II Sister   . Diabetes type II Brother   . Colon cancer Sister   . Migraines Neg Hx    Social History   Socioeconomic History  . Marital status: Legally Separated    Spouse name: None  . Number of children: 4  . Years of education: None  . Highest education level: None  Social Needs  . Financial resource strain: None  . Food insecurity - worry: None  . Food insecurity - inability: None  . Transportation needs - medical: None  . Transportation needs - non-medical: None  Occupational History  . Occupation: Disabled  Tobacco Use  . Smoking status: Never Smoker  . Smokeless tobacco: Never Used  Substance and Sexual Activity  . Alcohol use: No  . Drug use: No  . Sexual activity: None  Other Topics Concern  . None  Social History Narrative   Lives with daughter   Caffeine use: 1 cup coffee per day   Past Surgical History:  Procedure Laterality Date  . ABDOMINAL HYSTERECTOMY    . CATARACT EXTRACTION Left   . CATARACT EXTRACTION     LT 01/2017, 02/2017 on RT  . CESAREAN SECTION     Past Medical History:  Diagnosis Date  . Adenomatous colon polyp   . Anemia   . Anxiety   . Asthma   . CHF (congestive  heart failure) ([place])   . Diabetes mellitus without complication ([place])   . High cholesterol   . Hypertension   . Left knee injury   . Pneumonia    There were no vitals taken for this visit.  Opioid Risk Score:   Fall Risk Score:  `1  Depression screen PHQ 2/9  Depression screen [facility] 2/9 09/06/2017 08/23/2017 06/04/2017 11/20/2016 08/11/2016 05/21/2016 03/26/2016  Decreased Interest 1 3 0 0 0 0 1  Down, Depressed, Hopeless 1 0 1 0 0 0 1  PHQ - 2 Score 2 3 1  0 0 0 2  Altered sleeping 1 0 1 0 0 0 0  Tired, decreased energy 1 1 0 0 0 0 1  Change in appetite 1 1 0 0 0 0 0  Feeling bad or failure about yourself   1 0 0 0 0 0 0  Trouble concentrating 1 1 1 1  0 1 0  Moving slowly or fidgety/restless 1 0 1 1 0 0 0  Suicidal thoughts 0 0 - 0 0 0 0  PHQ-9 Score 8 6 4 2  0 1 3  Some recent data might be hidden     Review of Systems  Constitutional: Negative.   HENT: Negative.   Eyes: Negative.   Respiratory: Positive for cough, shortness of breath and wheezing.   Cardiovascular: Negative.   Gastrointestinal: Negative.   Endocrine: Negative.   Genitourinary: Negative.   Musculoskeletal: Positive for arthralgias, back pain, gait problem and myalgias.  Skin: Negative.   Allergic/Immunologic: Negative.   Hematological: Negative.   Psychiatric/Behavioral: Negative.   All other systems reviewed and are negative.      Objective:   Physical Exam Lumbar spine there is no evidence of scoliosis or kyphosis.  There is no tenderness palpation in the lumbar paraspinal muscles. There is limitation lumbar spine range of motion with flexion extension lateral rotation and bending all around 25% of normal.  She has more pain with extension as well as lateral bending than she does with forward flexion. Negative straight leg raise Lower extremity strength is normal. Patient is of some pain with palpation along the medial and lateral joint lines of the left knee.  There is no evidence of effusion.       Assessment & Plan:  1.  Chronic low back pain she has lumbar spondylosis but no clinical evidence of radiculopathy.  Her low back pain is bothering her more than the neck pain at the current time.  She does not feel that the tramadol 50 mg is helpful for her.  The patient is on sertraline so I am hesitant to increase the tramadol dosage. She once received a prescription for Tylenol with codeine however never filled it.  She states that hydrocodone helps her pain but at the initial visit she states she did not like the way it made her feel.  She denies that she said this to me. We discussed that if she wishes to  escalate her pain medication, we will need to repeat urine drug screen.  Once we resolve you the results we can call in Tylenol with codeine No. 3, 1 p.o. 3 times daily.  If this is not helpful we may have to go up to a Tylenol No. 4.  She can follow-up with nurse practitioner next month  In terms of her knee pain she will follow-up with orthopedic surgery

## 2017-09-17 NOTE — Patient Instructions (Signed)
Will check urine scrren , if this is ok will call in Tyelnol with codeine, Zella Ball will see you next month to evaluate effect

## 2017-09-18 ENCOUNTER — Other Ambulatory Visit: Payer: Self-pay | Admitting: Internal Medicine

## 2017-09-18 DIAGNOSIS — I5033 Acute on chronic diastolic (congestive) heart failure: Secondary | ICD-10-CM

## 2017-09-18 DIAGNOSIS — I10 Essential (primary) hypertension: Secondary | ICD-10-CM

## 2017-09-24 LAB — TOXASSURE SELECT,+ANTIDEPR,UR

## 2017-09-26 ENCOUNTER — Ambulatory Visit
Admission: RE | Admit: 2017-09-26 | Discharge: 2017-09-26 | Disposition: A | Discharge status: Home / Self Care | Payer: Medicare Other | Source: Ambulatory Visit | Attending: Orthopedic Surgery | Admitting: Orthopedic Surgery

## 2017-09-26 DIAGNOSIS — M25562 Pain in left knee: Secondary | ICD-10-CM

## 2017-09-27 ENCOUNTER — Telehealth: Payer: Self-pay | Admitting: *Deleted

## 2017-09-27 NOTE — Telephone Encounter (Signed)
Urine drug screen positive for Tramadol.  Last prescribed in January by Zella Ball.  There is no record of medications last taken.  Please advise. Note says we can call in tylenol #3 if uds ok.

## 2017-09-28 MED ORDER — ACETAMINOPHEN-CODEINE 300-30 MG PO TABS: 1.0000 | ORAL_TABLET | Freq: Two times a day (BID) | ORAL | 0 refills | Status: DC

## 2017-09-28 NOTE — Telephone Encounter (Signed)
May call in T#3 one po BID #60 No RF See NP 1 mo and get repeat UDS

## 2017-09-28 NOTE — Telephone Encounter (Signed)
Called to pharmacy and Mrs Ruggles notified.

## 2017-09-30 ENCOUNTER — Telehealth (INDEPENDENT_AMBULATORY_CARE_PROVIDER_SITE_OTHER): Payer: Self-pay | Admitting: Orthopedic Surgery

## 2017-09-30 NOTE — Telephone Encounter (Signed)
Patient called stating that she would like to have Dr. Marlou Sa order an MRI of her ankle because of the pain.  She stated that she can barely walk on it or put any pressure.  She would like it done before the next appointment that way Dr. Marlou Sa can review both of them for her.  CB#[phone].  Thank you.

## 2017-09-30 NOTE — Telephone Encounter (Signed)
Please advise. Thanks.  

## 2017-10-03 NOTE — Telephone Encounter (Signed)
This is a change from prior clinic visit - need clinical re eval of ankle first she was walking last time

## 2017-10-04 NOTE — Telephone Encounter (Signed)
Can you please call patient and sched appt with Heidi [name]?  See his note, thanks.

## 2017-10-07 ENCOUNTER — Other Ambulatory Visit: Payer: Self-pay | Admitting: Internal Medicine

## 2017-10-07 MED ORDER — ALBUTEROL SULFATE (2.5 MG/3ML) 0.083% IN NEBU: INHALATION_SOLUTION | RESPIRATORY_TRACT | 1 refills | Status: DC

## 2017-10-11 ENCOUNTER — Encounter (INDEPENDENT_AMBULATORY_CARE_PROVIDER_SITE_OTHER): Payer: Self-pay | Admitting: Orthopedic Surgery

## 2017-10-11 ENCOUNTER — Ambulatory Visit (INDEPENDENT_AMBULATORY_CARE_PROVIDER_SITE_OTHER): Payer: Medicare Other | Admitting: Orthopedic Surgery

## 2017-10-11 DIAGNOSIS — M25572 Pain in left ankle and joints of left foot: Secondary | ICD-10-CM

## 2017-10-11 DIAGNOSIS — S838X2D Sprain of other specified parts of left knee, subsequent encounter: Secondary | ICD-10-CM

## 2017-10-14 ENCOUNTER — Encounter (INDEPENDENT_AMBULATORY_CARE_PROVIDER_SITE_OTHER): Payer: Self-pay | Admitting: Orthopedic Surgery

## 2017-10-14 NOTE — Progress Notes (Signed)
Office Visit Note   Patient: Heidi [name]           Date of Birth: 03-08-1951           MRN: [id number] Visit Date: 10/11/2017 Requested by: Ladell Pier, MD [place], [place] [address] PCP: Ladell Pier, MD  Subjective: Chief Complaint  Patient presents with  . Left Knee - Follow-up    HPI: Heidi [name] is a patient with left knee and left ankle pain.  Since I have last seen her she has had an MRI scan of the left knee.  It does show a horizontal cleavage lateral meniscal tear.  No bone bruising is present.  Patient also reports left ankle pain.  All this stemmed from an accident when she was stepping off a bus several months ago.  She is using cream for her knee.  She is off tramadol.  She really states that her whole knee is painful it does not localized to the lateral side.  Denies any mechanical symptoms just reports pain.  She has not had a shot.  When I discussed with her about surgical versus nonsurgical intervention she is really most interested in nonsurgical intervention at this time.              ROS: All systems reviewed are negative as they relate to the chief complaint within the history of present illness.  Patient denies  fevers or chills.   Assessment & Plan: Visit Diagnoses:  1. Pain in left ankle and joints of left foot   2. Injury of meniscus of left knee, subsequent encounter     Plan: Impression is persistent left ankle pain with history of prior fracture and fixation done.  Could be exacerbation of existing nonclinical arthritis in that left ankle.  The left knee does have a lateral meniscal tear.  This could be traumatic.  She has not really symptomatic focally in that region but globally over the whole knee.  No effusion today.  No mechanical symptoms.  Discussed surgery versus no surgery and she really does not want to proceed with any type of surgical intervention at this time.  We will see what the left ankle shows and then we can make an  overall plan for either living with it or having some type of nonsurgical or surgical intervention.  Follow-Up Instructions: Return for after MRI.   Orders:  Orders Placed This Encounter  Procedures  . MR Ankle Left w/o contrast   No orders of the defined types were placed in this encounter.     Procedures: No procedures performed   Clinical Data: No additional findings.  Objective: Vital Signs: There were no vitals taken for this visit.  Physical Exam:   Constitutional: Patient appears well-developed HEENT:  Head: Normocephalic Eyes:EOM are normal Neck: Normal range of motion Cardiovascular: Normal rate Pulmonary/chest: Effort normal Neurologic: Patient is alert Skin: Skin is warm Psychiatric: Patient has normal mood and affect    Ortho Exam: Orthopedic exam demonstrates slightly antalgic gait to the left.  Left ankle has fairly reasonable range of motion with more or less global tenderness around the joint itself.  No pain with pronation supination of the foot.  Left knee has slightly more lateral joint line tenderness the medial joint line tenderness but collateral cruciate ligaments are stable.  Extensor mechanism is intact.  No groin pain with internal/external rotation of the leg.  Specialty Comments:  No specialty comments available.  Imaging: No results found.  PMFS History: Patient Active Problem List   Diagnosis Date Noted  . Morbid obesity due to excess calories ([place]) complicated by hbp/ dm / hyperlipidemia 09/10/2017  . Cough variant asthma vs UACS/vcd on ACEi  09/09/2017  . Anxiety and depression 09/06/2017  . Mild persistent asthma without complication 22/29/7989  . Paranoia ([place]) 09/06/2017  . Torn left ear lobe 11/20/2016  . Chronic migraine w/o aura w/o status migrainosus, not intractable 06/27/2016  . Post-menopausal atrophic vaginitis 11/14/2015  . Primary osteoarthritis of both knees 07/18/2015  . De Quervain's tenosynovitis, right  05/08/2015  . GERD (gastroesophageal reflux disease) 05/08/2015  . Renal cyst, right 05/08/2015  . Adenomatous polyp of colon 03/26/2015  . Essential hypertension 02/12/2015  . Cognitive deficit due to old head trauma 02/12/2015  . Asthma, chronic 02/12/2015  . Chronic left shoulder pain 02/11/2015  . Thyroid nodule 12/18/2014  . Chronic neck pain   . HLD (hyperlipidemia)   . OSA treated with BiPAP 11/27/2014  . Obesity hypoventilation syndrome ([place]) 11/27/2014  . Demand ischemia ([place])   . Diabetes type 2, uncontrolled ([place])   . Cardiomegaly 11/25/2014  . CHF (congestive heart failure) ([place]) 11/25/2014  . DM type 2 causing CKD stage 2 ([place]) 11/25/2014   Past Medical History:  Diagnosis Date  . Adenomatous colon polyp   . Anemia   . Anxiety   . Asthma   . CHF (congestive heart failure) ([place])   . Diabetes mellitus without complication ([place])   . High cholesterol   . Hypertension   . Left knee injury   . Pneumonia     Family History  Problem Relation Age of Onset  . Colon cancer Father   . Diabetes type II Sister   . Diabetes type II Brother   . Colon cancer Sister   . Migraines Neg Hx     Past Surgical History:  Procedure Laterality Date  . ABDOMINAL HYSTERECTOMY    . CATARACT EXTRACTION Left   . CATARACT EXTRACTION     LT 01/2017, 02/2017 on RT  . CESAREAN SECTION     Social History   Occupational History  . Occupation: Disabled  Tobacco Use  . Smoking status: Never Smoker  . Smokeless tobacco: Never Used  Substance and Sexual Activity  . Alcohol use: No  . Drug use: No  . Sexual activity: Not on file

## 2017-10-18 ENCOUNTER — Encounter (HOSPITAL_COMMUNITY): Payer: Self-pay | Admitting: Emergency Medicine

## 2017-10-18 ENCOUNTER — Telehealth (INDEPENDENT_AMBULATORY_CARE_PROVIDER_SITE_OTHER): Payer: Self-pay | Admitting: *Deleted

## 2017-10-18 ENCOUNTER — Ambulatory Visit (HOSPITAL_COMMUNITY)
Admission: EM | Admit: 2017-10-18 | Discharge: 2017-10-18 | Disposition: A | Discharge status: Home / Self Care | Payer: Medicare Other | Attending: Internal Medicine | Admitting: Internal Medicine

## 2017-10-18 DIAGNOSIS — J069 Acute upper respiratory infection, unspecified: Secondary | ICD-10-CM | POA: Diagnosis not present

## 2017-10-18 DIAGNOSIS — M79672 Pain in left foot: Secondary | ICD-10-CM

## 2017-10-18 MED ORDER — AZITHROMYCIN 250 MG PO TABS: 250.0000 mg | ORAL_TABLET | Freq: Every day | ORAL | 0 refills | Status: DC

## 2017-10-18 NOTE — ED Triage Notes (Signed)
Pt sts URI sx

## 2017-10-18 NOTE — ED Provider Notes (Addendum)
[facility]    CSN: [id number] Arrival date & time: 10/18/17  1038     History   Chief Complaint Chief Complaint  Patient presents with  . URI    HPI Heidi [name] is a 67 y.o. female complaining of upper respiratory symptoms.  Patient reports dry cough, runny nose, sneezing, drainage and sore throat.  She reports having symptoms for the past two days.  She denies a precipitating event or sick contacts.  She has tried OTC mucinex with minimal relief.  She reports hx of pneumonia that was treated with antibiotics in February.  She reports having allergies as well.    HPI  Past Medical History:  Diagnosis Date  . Adenomatous colon polyp   . Anemia   . Anxiety   . Asthma   . CHF (congestive heart failure) ([place])   . Diabetes mellitus without complication ([place])   . High cholesterol   . Hypertension   . Left knee injury   . Pneumonia     Patient Active Problem List   Diagnosis Date Noted  . Morbid obesity due to excess calories ([place]) complicated by hbp/ dm / hyperlipidemia 09/10/2017  . Cough variant asthma vs UACS/vcd on ACEi  09/09/2017  . Anxiety and depression 09/06/2017  . Mild persistent asthma without complication 28/41/3244  . Paranoia ([place]) 09/06/2017  . Torn left ear lobe 11/20/2016  . Chronic migraine w/o aura w/o status migrainosus, not intractable 06/27/2016  . Post-menopausal atrophic vaginitis 11/14/2015  . Primary osteoarthritis of both knees 07/18/2015  . De Quervain's tenosynovitis, right 05/08/2015  . GERD (gastroesophageal reflux disease) 05/08/2015  . Renal cyst, right 05/08/2015  . Adenomatous polyp of colon 03/26/2015  . Essential hypertension 02/12/2015  . Cognitive deficit due to old head trauma 02/12/2015  . Asthma, chronic 02/12/2015  . Chronic left shoulder pain 02/11/2015  . Thyroid nodule 12/18/2014  . Chronic neck pain   . HLD (hyperlipidemia)   . OSA treated with BiPAP 11/27/2014  . Obesity hypoventilation syndrome ([place])  11/27/2014  . Demand ischemia ([place])   . Diabetes type 2, uncontrolled ([place])   . Cardiomegaly 11/25/2014  . CHF (congestive heart failure) ([place]) 11/25/2014  . DM type 2 causing CKD stage 2 ([place]) 11/25/2014    Past Surgical History:  Procedure Laterality Date  . ABDOMINAL HYSTERECTOMY    . CATARACT EXTRACTION Left   . CATARACT EXTRACTION     LT 01/2017, 02/2017 on RT  . CESAREAN SECTION      OB History   None      Home Medications    Prior to Admission medications   Medication Sig Start Date End Date Taking? Authorizing Provider  Acetaminophen-Codeine 300-30 MG tablet Take 1 tablet by mouth 2 (two) times daily. 09/28/17   Kirsteins, Luanna Salk, MD  albuterol (PROVENTIL HFA;VENTOLIN HFA) 108 (90 Base) MCG/ACT inhaler INHALE 2 PUFFS INTO THE LUNGS EVERY 6 HOURS AS NEEDED FOR WHEEZING OR SHORTNESS OF BREATH 09/06/17   Ladell Pier, MD  albuterol (PROVENTIL) (2.5 MG/3ML) 0.083% nebulizer solution INHALE CONTENTS OF 1 VIAL VIA NEBULIZER EVERY 6 HOURS AS NEEDED FOR WHEEZING OR SHORTNESS OF BREATH 10/07/17   Ladell Pier, MD  Ascorbic Acid (VITAMIN C PO) Take 1 tablet by mouth daily.    [provider]  aspirin 81 MG chewable tablet Chew 81 mg by mouth daily.     [provider]  atenolol (TENORMIN) 25 MG tablet Take 1 tablet (25 mg total) by mouth daily.  02/26/17   Ladell Pier, MD  atorvastatin (LIPITOR) 40 MG tablet TAKE 1 TABLET BY MOUTH DAILY AT 6 PM 07/13/17   Ladell Pier, MD  bumetanide (BUMEX) 0.5 MG tablet Take 1 tablet (0.5 mg total) by mouth daily. 02/26/17   Ladell Pier, MD  calcium-vitamin D (OSCAL WITH D) 500-200 MG-UNIT tablet Take 1 tablet by mouth daily. 06/04/17   Ladell Pier, MD  conjugated estrogens (PREMARIN) vaginal cream Insert 0.5 grams vaginally once a wk. 05/19/17   Ladell Pier, MD  diclofenac sodium (VOLTAREN) 1 % GEL Apply 2 g topically 4 (four) times daily. 08/23/17   Ladell Pier, MD  glucose blood  (FREESTYLE TEST STRIPS) test strip Use as instructed for 3 times daily testing of blood glucose. 05/31/17   Ladell Pier, MD  hydrochlorothiazide (HYDRODIURIL) 25 MG tablet Take 1 tablet (25 mg total) by mouth daily. 02/26/17   Ladell Pier, MD  insulin lispro protamine-lispro (HUMALOG 75/25 MIX) (75-25) 100 UNIT/ML SUSP injection Inject subcutaneously 80 U in the morning with food and 55 U at in evening with meals 09/06/17   Ladell Pier, MD  INSULIN SYRINGE 1CC/29G 29G X 1/2" 1 ML MISC USE THREE TIMES DAILY AS DIRECTED Patient taking differently: USE TWO TIMES DAILY AS DIRECTED 08/16/17   Ladell Pier, MD  Lancets (FREESTYLE) lancets Use as directed 3 times daily. E11.9 06/08/17   Ladell Pier, MD  losartan (COZAAR) 50 MG tablet Take 1 tablet (50 mg total) by mouth daily. 09/09/17   Tanda Rockers, MD  montelukast (SINGULAIR) 10 MG tablet TAKE 1 TABLET(10 MG) BY MOUTH DAILY 08/23/17   Ladell Pier, MD  Multiple Vitamin (MULTIVITAMIN WITH MINERALS) TABS tablet Take 1 tablet by mouth daily.    [provider]  nitroGLYCERIN (NITROSTAT) 0.3 MG SL tablet Place 1 tablet (0.3 mg total) under the tongue every 5 (five) minutes as needed for chest pain. Max 3 tablets in 15 minutes 08/07/16   Funches, Adriana Mccallum, MD  nizatidine (AXID) 150 MG capsule Take 1 capsule (150 mg total) by mouth at bedtime. 09/09/17   Tanda Rockers, MD  ondansetron (ZOFRAN) 4 MG tablet Take 1 tablet (4 mg total) by mouth every 6 (six) hours. 07/12/17   Marney Setting, NP  pantoprazole (PROTONIX) 40 MG tablet Take 1 tablet (40 mg total) by mouth daily. Take 30-60 min before first meal of the day 09/09/17   Tanda Rockers, MD  Potassium Chloride ER 20 MEQ TBCR Take 20 mEq by mouth daily. 09/16/17   Ladell Pier, MD  sertraline (ZOLOFT) 50 MG tablet 1/2 tab daily x 2 wks then 1 tab daily. 09/06/17   Ladell Pier, MD  topiramate (TOPAMAX) 25 MG capsule Take 3 capsules (75 mg total) by mouth 2  (two) times daily. 01/12/17   Melvenia Beam, MD  traMADol (ULTRAM) 50 MG tablet Take 1 tablet (50 mg total) by mouth every 8 (eight) hours as needed. 09/17/17   Kirsteins, Luanna Salk, MD    Family History Family History  Problem Relation Age of Onset  . Colon cancer Father   . Diabetes type II Sister   . Diabetes type II Brother   . Colon cancer Sister   . Migraines Neg Hx     Social History Social History   Tobacco Use  . Smoking status: Never Smoker  . Smokeless tobacco: Never Used  Substance Use Topics  . Alcohol use:  No  . Drug use: No     Allergies   Patient has no known allergies.   Review of Systems Review of Systems  Constitutional: Negative for fever.  HENT: Positive for congestion, postnasal drip, rhinorrhea, sneezing and sore throat. Negative for ear pain, sinus pressure and sinus pain.   Respiratory: Positive for cough. Negative for shortness of breath and wheezing.   Cardiovascular: Negative for chest pain.  Gastrointestinal: Negative for abdominal pain.  Allergic/Immunologic: Positive for environmental allergies.     Physical Exam Triage Vital Signs ED Triage Vitals [10/18/17 1121]  Enc Vitals Group     BP 127/75     Pulse Rate 80     Resp 18     Temp 98.6 F (37 C)     Temp Source Oral     SpO2 95 %     Weight      Height      Head Circumference      Peak Flow      Pain Score      Pain Loc      Pain Edu?      Excl. in [place]?    No data found.  Updated Vital Signs BP 127/75 (BP Location: Left Arm)   Pulse 80   Temp 98.6 F (37 C) (Oral)   Resp 18   SpO2 95%   Visual Acuity Right Eye Distance:   Left Eye Distance:   Bilateral Distance:    Right Eye Near:   Left Eye Near:    Bilateral Near:     Physical Exam  Constitutional: She is oriented to person, place, and time. She appears well-developed and well-nourished.  HENT:  Head: Normocephalic and atraumatic.  Right Ear: External ear normal.  Left Ear: External ear normal.    Nose: Nose normal.  Mouth/Throat: Oropharynx is clear and moist.  Eyes: Pupils are equal, round, and reactive to light.  Neck: Normal range of motion. Neck supple.  Cardiovascular: Normal rate, regular rhythm and normal heart sounds.  Pulmonary/Chest: Effort normal and breath sounds normal. No stridor. No respiratory distress. She has no wheezes. She has no rales.  Abdominal: Soft. There is no tenderness.  Musculoskeletal:  Ambulates from chair to exam table with minimal difficulty.  Uses a cane  Lymphadenopathy:    She has no cervical adenopathy.  Neurological: She is alert and oriented to person, place, and time.  Skin: Skin is warm and dry.  Vitals reviewed.    UC Treatments / Results  Labs (all labs ordered are listed, but only abnormal results are displayed) Labs Reviewed - No data to display  EKG None Radiology No results found.  Procedures Procedures (including critical care time)  Medications Ordered in UC Medications - No data to display   Initial Impression / Assessment and Plan / UC Course  I have reviewed the triage vital signs and the nursing notes.  Pertinent labs & imaging results that were available during my care of the patient were reviewed by me and considered in my medical decision making (see chart for details).  Patient reports two day hx of UR symptoms.  Previous CXR showed PNA in February of this year.  Was treated at that time with an antibiotic.  Patient requests z-pak today to prevent worsening of symptoms.  Patient was instructed to fill medication after a couple of days and to continue the use of OTC medications for symptomatic relief.   Patient was educated that symptoms are most likely viral  or allergic in etiology   Final Clinical Impressions(s) / UC Diagnoses   Final diagnoses:  Viral upper respiratory tract infection    ED Discharge Orders    None       Controlled Substance Prescriptions [place] Controlled Substance Registry  consulted? Not Applicable   Lestine Box, PA-C 10/18/17 [place], [place], [place] 10/20/17 1208

## 2017-10-18 NOTE — Discharge Instructions (Addendum)
Continue OTC medications for symptomatic relief Recommend taking OTC zyrtec for allergy relief. Get plenty of rest and drink fluids Paper script for z-pak given in office.  Patient advised to wait 2-3 days prior to filling medication given the fact that illness is most likely viral in etiology Follow up with PCP or return if symptoms persist. Return immediately or go to ER if you have any new or worsening symptoms

## 2017-10-18 NOTE — Telephone Encounter (Signed)
Please advise. Thanks.  

## 2017-10-18 NOTE — Telephone Encounter (Signed)
Sure

## 2017-10-18 NOTE — Telephone Encounter (Signed)
Pt is scheduled for MRI of Left ankle but wants to know if she can get MRI of left foot as well, states is pain also. Please advise.

## 2017-10-19 ENCOUNTER — Ambulatory Visit (HOSPITAL_BASED_OUTPATIENT_CLINIC_OR_DEPARTMENT_OTHER): Payer: Medicare Other | Admitting: Physical Medicine & Rehabilitation

## 2017-10-19 ENCOUNTER — Encounter: Payer: Self-pay | Admitting: Physical Medicine & Rehabilitation

## 2017-10-19 ENCOUNTER — Encounter: Payer: Medicare Other | Attending: Physical Medicine & Rehabilitation

## 2017-10-19 VITALS — BP 110/71 | HR 78 | Ht 64.0 in | Wt 224.8 lb

## 2017-10-19 DIAGNOSIS — Z87891 Personal history of nicotine dependence: Secondary | ICD-10-CM | POA: Insufficient documentation

## 2017-10-19 DIAGNOSIS — Z90710 Acquired absence of both cervix and uterus: Secondary | ICD-10-CM | POA: Diagnosis not present

## 2017-10-19 DIAGNOSIS — M47812 Spondylosis without myelopathy or radiculopathy, cervical region: Secondary | ICD-10-CM

## 2017-10-19 DIAGNOSIS — Z5181 Encounter for therapeutic drug level monitoring: Secondary | ICD-10-CM | POA: Diagnosis not present

## 2017-10-19 DIAGNOSIS — J45909 Unspecified asthma, uncomplicated: Secondary | ICD-10-CM | POA: Insufficient documentation

## 2017-10-19 DIAGNOSIS — Z800 Family history of malignant neoplasm of digestive organs: Secondary | ICD-10-CM | POA: Diagnosis not present

## 2017-10-19 DIAGNOSIS — Z9889 Other specified postprocedural states: Secondary | ICD-10-CM | POA: Insufficient documentation

## 2017-10-19 DIAGNOSIS — M545 Low back pain: Secondary | ICD-10-CM | POA: Insufficient documentation

## 2017-10-19 DIAGNOSIS — E119 Type 2 diabetes mellitus without complications: Secondary | ICD-10-CM | POA: Diagnosis not present

## 2017-10-19 DIAGNOSIS — M47896 Other spondylosis, lumbar region: Secondary | ICD-10-CM | POA: Diagnosis not present

## 2017-10-19 DIAGNOSIS — Z9842 Cataract extraction status, left eye: Secondary | ICD-10-CM | POA: Insufficient documentation

## 2017-10-19 DIAGNOSIS — Z9841 Cataract extraction status, right eye: Secondary | ICD-10-CM | POA: Diagnosis not present

## 2017-10-19 DIAGNOSIS — I509 Heart failure, unspecified: Secondary | ICD-10-CM | POA: Insufficient documentation

## 2017-10-19 DIAGNOSIS — Z79899 Other long term (current) drug therapy: Secondary | ICD-10-CM | POA: Diagnosis not present

## 2017-10-19 DIAGNOSIS — G894 Chronic pain syndrome: Secondary | ICD-10-CM | POA: Insufficient documentation

## 2017-10-19 DIAGNOSIS — I10 Essential (primary) hypertension: Secondary | ICD-10-CM | POA: Diagnosis not present

## 2017-10-19 DIAGNOSIS — Z833 Family history of diabetes mellitus: Secondary | ICD-10-CM | POA: Diagnosis not present

## 2017-10-19 DIAGNOSIS — Z86010 Personal history of colonic polyps: Secondary | ICD-10-CM | POA: Insufficient documentation

## 2017-10-19 MED ORDER — ACETAMINOPHEN-CODEINE 300-30 MG PO TABS: 1.0000 | ORAL_TABLET | Freq: Two times a day (BID) | ORAL | 2 refills | Status: DC

## 2017-10-19 NOTE — Telephone Encounter (Signed)
Order added.

## 2017-10-19 NOTE — Progress Notes (Signed)
Subjective:    Patient ID: Heidi [name], female    DOB: Aug 17, 1950, 67 y.o.   MRN: [id number] Patient notes onset around 2016. I have reviewed notes from [facility] in [place]. Patient states that she had been treated for low back pain, although it was her mid and upper back pain that hurt her worse. Epidural at L4-5, reported as no improvement. MRI of lumbar spine August 2015, interpretation reviewed. trace facet effusions L1 2 disc space narrowing without stenosis centrally, L2-3, left-sided stenosis, foraminal  At L2-3. facet effusions L3-4 neural foraminal narrowing, right greater than left at L3-4. no central stenosis. at L4-5 facet joint hypertrophy. foraminal stenosis, moderate to advanced. L5-S1 moderate. hypertrophy of the facet joints.  Seen by primary care here in [place], placed on Tylenol with Codeine for pain, limited supply while patient was waiting for consultation   HPI Patient complains of neck pain.  She shows me records from previous physicians in [place].  MRI of the cervical spine from 2016.  Report reviewed.  Patient was concerned about a sentence that said there is evidence of fracture or dislocation.  Reading the body of the report there was no mention of fracture or dislocation.  We also reviewed a more recent MRI of the cervical spine dated 04/10/2016 which showed multilevel degenerative changes from C4 through C7.  Of note was some bone marrow changes at multiple levels seen on previous MRI.  Of note is patient does have chronic renal insufficiency which can cause these changes.  We discussed that the report must have had a typo and should have read there is no evidence of fracture or dislocation.  Currently her biggest concern is left knee and left ankle pain and she is following up with orthopedics on this.  She notes that this started after stepping off an ambulance during transport to the emergency department. Pain Inventory Average Pain 9 Pain Right Now  6 My pain is burning  In the last 24 hours, has pain interfered with the following? General activity 10 Relation with others 10 Enjoyment of life 9 What TIME of day is your pain at its worst? . Sleep (in general) .  Pain is worse with: walking, standing and some activites Pain improves with: rest and medication Relief from Meds: 2  Mobility use a cane use a walker ability to climb steps?  yes do you drive?  yes  Function not employed: date last employed .  Neuro/Psych bladder control problems weakness  Prior Studies Any changes since last visit?  no MRI CERVICAL SPINE WITHOUT CONTRAST  TECHNIQUE: Multiplanar, multisequence MR imaging of the cervical spine was performed. No intravenous contrast was administered.  COMPARISON:  Prior MRI from 11/30/2014.  FINDINGS: Alignment: Stable alignment with straightening of the normal cervical lordosis. The no listhesis.  Vertebrae: The vertebral body heights maintained. Abnormal heterogeneous appearance of the vertebral body bone marrow, similar to previous study. Again, finding is like related to red marrow conversion related to underlying chronic anemia or possibly hypoxemia. Lymphoproliferative disorder could also have this appearance.  Cord: Signal intensity within the cervical spinal cord is normal.  Posterior Fossa, vertebral arteries, paraspinal tissues: Probable chronic microvascular ischemic changes partially visualized within the pons. Partially visualized brain and posterior fossa are otherwise unremarkable. Craniocervical junction within normal limits. Paraspinous and prevertebral soft tissues within normal limits. Major intracranial vascular flow voids maintained within the vertebral arteries.  Disc levels:  C2-C3: Negative.  C3-C4: Mild bilateral uncovertebral hypertrophy with minimal disc bulge.  No significant stenosis.  C4-C5: Degenerative intervertebral disc space narrowing with  diffuse disc bulge. Mild bilateral uncovertebral hypertrophy. No significant stenosis.  C5-C6: Degenerative intervertebral disc space narrowing with diffuse disc bulge and bilateral uncovertebral spurring. Resultant moderate bilateral foraminal narrowing, right worse than left. Changes are similar to previous. Broad-based posterior disc osteophyte flattens the ventral thecal sac without significant canal stenosis.  C6-C7: Degenerative intervertebral disc space narrowing with bilateral uncovertebral spurring. Mild bilateral foraminal narrowing. No significant canal stenosis.  C7-T1:  Negative.  The partially visualized upper thoracic spine demonstrates no significant degenerative changes are stenosis.  IMPRESSION: 1. Stable appearance of the cervical spine with multilevel degenerative spondylolysis extending from C4-5 through C6-7, greatest at C5-6 where there is moderate bilateral foraminal stenosis. 2. Abnormal marrow, likely related to red marrow reconversion due to chronic anemia or hypoxemia, similar to previous. Again, lymphoproliferative disorder could also have this appearance.   Electronically Signed   By: Jeannine Boga M.D.   On: 04/10/2016 21:41 Physicians involved in your care Any changes since last visit?  no   Family History  Problem Relation Age of Onset  . Colon cancer Father   . Diabetes type II Sister   . Diabetes type II Brother   . Colon cancer Sister   . Migraines Neg Hx    Social History   Socioeconomic History  . Marital status: Legally Separated    Spouse name: Not on file  . Number of children: 4  . Years of education: Not on file  . Highest education level: Not on file  Occupational History  . Occupation: Disabled  Social Needs  . Financial resource strain: Not on file  . Food insecurity:    Worry: Not on file    Inability: Not on file  . Transportation needs:    Medical: Not on file    Non-medical: Not on file   Tobacco Use  . Smoking status: Never Smoker  . Smokeless tobacco: Never Used  Substance and Sexual Activity  . Alcohol use: No  . Drug use: No  . Sexual activity: Not on file  Lifestyle  . Physical activity:    Days per week: Not on file    Minutes per session: Not on file  . Stress: Not on file  Relationships  . Social connections:    Talks on phone: Not on file    Gets together: Not on file    Attends religious service: Not on file    Active member of club or organization: Not on file    Attends meetings of clubs or organizations: Not on file    Relationship status: Not on file  Other Topics Concern  . Not on file  Social History Narrative   Lives with daughter   Caffeine use: 1 cup coffee per day   Past Surgical History:  Procedure Laterality Date  . ABDOMINAL HYSTERECTOMY    . CATARACT EXTRACTION Left   . CATARACT EXTRACTION     LT 01/2017, 02/2017 on RT  . CESAREAN SECTION     Past Medical History:  Diagnosis Date  . Adenomatous colon polyp   . Anemia   . Anxiety   . Asthma   . CHF (congestive heart failure) ([place])   . Diabetes mellitus without complication ([place])   . High cholesterol   . Hypertension   . Left knee injury   . Pneumonia    There were no vitals taken for this visit.  Opioid Risk Score:   Fall  Risk Score:  `1  Depression screen PHQ 2/9  Depression screen [facility] 2/9 09/06/2017 08/23/2017 06/04/2017 11/20/2016 08/11/2016 05/21/2016 03/26/2016  Decreased Interest 1 3 0 0 0 0 1  Down, Depressed, Hopeless 1 0 1 0 0 0 1  PHQ - 2 Score 2 3 1  0 0 0 2  Altered sleeping 1 0 1 0 0 0 0  Tired, decreased energy 1 1 0 0 0 0 1  Change in appetite 1 1 0 0 0 0 0  Feeling bad or failure about yourself  1 0 0 0 0 0 0  Trouble concentrating 1 1 1 1  0 1 0  Moving slowly or fidgety/restless 1 0 1 1 0 0 0  Suicidal thoughts 0 0 - 0 0 0 0  PHQ-9 Score 8 6 4 2  0 1 3  Some recent data might be hidden     Review of Systems  Constitutional: Negative.   HENT:  Positive for congestion, sneezing and sore throat.   Eyes: Negative.   Respiratory: Positive for cough, shortness of breath and wheezing.   Cardiovascular: Negative.   Gastrointestinal: Negative.   Endocrine: Negative.   Genitourinary: Positive for difficulty urinating.  Musculoskeletal: Positive for arthralgias, gait problem and myalgias.  Skin: Negative.   Allergic/Immunologic: Negative.   Neurological: Positive for weakness.  Hematological: Negative.   Psychiatric/Behavioral: Negative.   All other systems reviewed and are negative.      Objective:   Physical Exam  Constitutional: She appears well-developed and well-nourished.  HENT:  Head: Normocephalic and atraumatic.  Eyes: Pupils are equal, round, and reactive to light. Conjunctivae and EOM are normal.  Neck:  Cervical range of motion is 75% rotation to the right and left 50% flexion extension and lateral bending.  Negative Spurling's  Neurological: She displays no atrophy and no tremor. She exhibits normal muscle tone. Coordination normal.  Reflex Scores:      Tricep reflexes are 1+ on the right side and 1+ on the left side.      Bicep reflexes are 1+ on the right side and 1+ on the left side.      Brachioradialis reflexes are 1+ on the right side and 1+ on the left side.      Patellar reflexes are 1+ on the right side.      Achilles reflexes are 1+ on the right side and 1+ on the left side. Motor strength is 5/5 bilateral deltoid bicep tricep grip  poor relaxation during deep tendon reflex testing  Nursing note and vitals reviewed.         Assessment & Plan:  #1.  Cervical spondylosis and cervical degenerative disc this is a chronic problem dating back to at least 2016.  She has no clinical evidence of radiculopathy or myelopathy at the current time.  She is concerned about the MRI report from 2016.  She would like another opinion as to whether another imaging study is needed.  I have made a referral to [facility]  orthopedics[facility]. Rolena Infante  2.  Left knee and left ankle pain follow-up with Dr. Marlou Sa from [facility] orthopedics  3.  Chronic pain syndrome, chronic neck pain which is degenerative in nature not likely to improve with time.  We discussed with patient.  We discussed that the pain medication does not have any curative effect we will continue current dose.  Indication for chronic opioid: Cervical DDD, Cervical spondylosis Medication and dose: tylenol #3 with codeine # pills per month: 60 Last  UDS date: 09/17/17 Opioid Treatment Agreement signed (Y/N): y Opioid Treatment Agreement last reviewed with patient:  09/17/17 [facility] reviewed this encounter (include red flags):  10/19/17  Recommend follow-up with nurse practitioner however patient states that she wishes to see MD and it is just her right to do so

## 2017-10-19 NOTE — Telephone Encounter (Signed)
Noted, and I sw Melissa with [facility] and she is going to try to schedule pt for same day as her ankle

## 2017-10-19 NOTE — Patient Instructions (Addendum)
REFERRAL TO [facility] [place]

## 2017-10-20 ENCOUNTER — Telehealth (HOSPITAL_COMMUNITY): Payer: Self-pay | Admitting: Emergency Medicine

## 2017-10-20 ENCOUNTER — Other Ambulatory Visit: Payer: Medicare Other

## 2017-10-20 ENCOUNTER — Other Ambulatory Visit: Payer: Self-pay | Admitting: Internal Medicine

## 2017-10-20 DIAGNOSIS — F329 Major depressive disorder, single episode, unspecified: Secondary | ICD-10-CM

## 2017-10-20 DIAGNOSIS — F419 Anxiety disorder, unspecified: Principal | ICD-10-CM

## 2017-10-20 MED ORDER — AZITHROMYCIN 250 MG PO TABS: 250.0000 mg | ORAL_TABLET | Freq: Every day | ORAL | 0 refills | Status: AC

## 2017-10-20 MED ORDER — SERTRALINE HCL 50 MG PO TABS: 50.0000 mg | ORAL_TABLET | Freq: Every day | ORAL | 6 refills | Status: DC

## 2017-10-20 NOTE — Telephone Encounter (Signed)
Patient reports medication was not at pharmacy.  Called pharmacy and gave verbal script order to pharmacy staff.  Called made at 16:49.  Patient access notified patient that task completed

## 2017-10-20 NOTE — Telephone Encounter (Signed)
Reprinting prescription for z-pak.  Patient states she lost prescription, and needs another copy.

## 2017-10-21 ENCOUNTER — Other Ambulatory Visit: Payer: Self-pay | Admitting: Internal Medicine

## 2017-10-21 DIAGNOSIS — J45909 Unspecified asthma, uncomplicated: Secondary | ICD-10-CM

## 2017-10-21 DIAGNOSIS — M654 Radial styloid tenosynovitis [de Quervain]: Secondary | ICD-10-CM

## 2017-10-23 LAB — 6-ACETYLMORPHINE,TOXASSURE ADD
6-ACETYLMORPHINE: NEGATIVE
6-acetylmorphine: NOT DETECTED ng/mg creat

## 2017-10-23 LAB — TOXASSURE SELECT,+ANTIDEPR,UR

## 2017-10-24 ENCOUNTER — Ambulatory Visit
Admission: RE | Admit: 2017-10-24 | Discharge: 2017-10-24 | Disposition: A | Discharge status: Home / Self Care | Payer: Medicare Other | Source: Ambulatory Visit | Attending: Orthopedic Surgery | Admitting: Orthopedic Surgery

## 2017-10-24 DIAGNOSIS — M25572 Pain in left ankle and joints of left foot: Secondary | ICD-10-CM

## 2017-10-24 DIAGNOSIS — M79672 Pain in left foot: Secondary | ICD-10-CM

## 2017-10-24 DIAGNOSIS — M19072 Primary osteoarthritis, left ankle and foot: Secondary | ICD-10-CM | POA: Diagnosis not present

## 2017-10-24 DIAGNOSIS — R600 Localized edema: Secondary | ICD-10-CM | POA: Diagnosis not present

## 2017-10-25 ENCOUNTER — Ambulatory Visit: Payer: Medicare Other | Admitting: Internal Medicine

## 2017-10-25 ENCOUNTER — Encounter: Payer: Medicare Other | Admitting: Internal Medicine

## 2017-10-27 ENCOUNTER — Ambulatory Visit (INDEPENDENT_AMBULATORY_CARE_PROVIDER_SITE_OTHER): Payer: Medicare Other | Admitting: Internal Medicine

## 2017-10-27 ENCOUNTER — Encounter: Payer: Self-pay | Admitting: Internal Medicine

## 2017-10-27 ENCOUNTER — Telehealth: Payer: Self-pay | Admitting: *Deleted

## 2017-10-27 VITALS — BP 112/68 | HR 90 | Ht 64.0 in | Wt 227.0 lb

## 2017-10-27 DIAGNOSIS — I10 Essential (primary) hypertension: Secondary | ICD-10-CM | POA: Diagnosis not present

## 2017-10-27 DIAGNOSIS — J45991 Cough variant asthma: Secondary | ICD-10-CM | POA: Diagnosis not present

## 2017-10-27 DIAGNOSIS — J45909 Unspecified asthma, uncomplicated: Secondary | ICD-10-CM

## 2017-10-27 LAB — PULMONARY FUNCTION TEST
DL/VA % PRED: 116 %
DL/VA: 5.58 ml/min/mmHg/L
DLCO UNC: 9.77 ml/min/mmHg
DLCO unc % pred: 40 %
FEF 25-75 POST: 0.99 L/s
FEF 25-75 Pre: 1.35 L/sec
FEF2575-%Change-Post: -26 %
FEF2575-%PRED-PRE: 72 %
FEF2575-%Pred-Post: 53 %
FEV1-%Change-Post: -6 %
FEV1-%PRED-POST: 51 %
FEV1-%PRED-PRE: 54 %
FEV1-PRE: 1.07 L
FEV1-Post: 1 L
FEV1FVC-%CHANGE-POST: 1 %
FEV1FVC-%Pred-Pre: 110 %
FEV6-%CHANGE-POST: -4 %
FEV6-%PRED-PRE: 49 %
FEV6-%Pred-Post: 47 %
FEV6-PRE: 1.2 L
FEV6-Post: 1.14 L
FEV6FVC-%PRED-POST: 104 %
FEV6FVC-%PRED-PRE: 104 %
FVC-%Change-Post: -7 %
FVC-%Pred-Post: 45 %
FVC-%Pred-Pre: 49 %
FVC-Post: 1.14 L
FVC-Pre: 1.24 L
POST FEV6/FVC RATIO: 100 %
PRE FEV1/FVC RATIO: 86 %
Post FEV1/FVC ratio: 87 %
Pre FEV6/FVC Ratio: 100 %
RV % pred: 62 %
RV: 1.3 L
TLC % PRED: 60 %
TLC: 3.04 L

## 2017-10-27 NOTE — Progress Notes (Signed)
Subjective:    Patient ID: Heidi [name], female   DOB: [birth year]      MRN: [id number]    Brief patient profile:  71 yobf never smoker healthy child/ adolescent with last IUP age 67  around 34  with baseline wt around 170 good activity tol but no aerobics then gradual wt gain assoc with hbp and placed on ACEi at some point not clear when but thinks was likely prior  to onset sob and freq "colds"  2015  >  eval by [place] pulmonary doctor dx asthma > symbicort / albuterol not helpful  So referred to pulmonary clinic 09/09/2017 by Dr   Karle Plumber.    History of Present Illness  09/09/2017 1st [name] office visit/ Heidi [name]  On acei since at least 2015 pulmonary ov per records  Chief Complaint  Patient presents with  . Pulmonary Consult    Referred by Dr. Wynetta Emery. Pt states dxed with PNA 08/14/17. She c/o PND and cough is non prod She is using her albuterol inhaler 3 x daily and rarely uses her neb.   Sleeps ok on 2 pillows but can't walk 50 ft s sob = baseline x years assoc with dry hacking cough/ min improvement on saba which she used w/in 2 h of ov "just couldn't get to the office without is (note very poor hfa)  - neb doesn't work much better  rec Stop lisinopril and start losartan 50 mg one daily  your breathing problems should gradually resolve over the next few weeks Plan B = Backup Only use your albuterol as a rescue medication  Plan C = Crisis - only use your albuterol nebulizer if you first try Plan B  GERD diet Pantoprazole (protonix) 40 mg(or Prilosec 40 mg)   Take  30-60 min before first meal of the day and zantac (Ranitidine)  150  mg one hour before  bedtime until return to office - this is the best way to tell whether stomach acid is contributing to your problem.          10/27/2017  f/u ov/Heidi [name] re:  uacs / obesity  Chief Complaint  Patient presents with  . Follow-up    Cough has improved some, but has not resolved. She now relates the cough to allergies. She is  coughing up minimal green to yellow sputum.  She is using her rescue inhaler 1 x per wk on average and has only used her neb x 1 since her last visit.   Dyspnea:  Resolved to her satisfaction Cough: better until about    more noct than daytime / no am flare  Sleep: fine unless has "cold" horizontal  SABA use:  As above, not sure it helps    No obvious day to day or daytime variability or assoc excess/ purulent sputum or mucus plugs or hemoptysis or cp or chest tightness, subjective wheeze or overt sinus or hb symptoms. No unusual exposure hx or h/o childhood pna/ asthma or knowledge of premature birth.  Sleeping  Ok most nocts  without early am exacerbation  of respiratory  c/o's or need for noct saba. Also denies any obvious fluctuation of symptoms with weather or environmental changes or other aggravating or alleviating factors except as outlined above   Current Allergies, Complete Past Medical History, Past Surgical History, Family History, and Social History were reviewed in [employer] record.  ROS  The following are not active complaints unless bolded Hoarseness, sore throat, dysphagia, dental problems, itching,  sneezing,  nasal congestion or discharge of excess mucus or purulent secretions, ear ache,   fever, chills, sweats, unintended wt loss or wt gain, classically pleuritic or exertional cp,  orthopnea pnd or arm/hand swelling  or leg swelling, presyncope, palpitations, abdominal pain, anorexia, nausea, vomiting, diarrhea  or change in bowel habits or change in bladder habits, change in stools or change in urine, dysuria, hematuria,  rash, arthralgias, visual complaints, headache, numbness, weakness or ataxia or problems with walking using R crutch or coordination,  change in mood or  memory.        Current Meds  Medication Sig  . Acetaminophen-Codeine 300-30 MG tablet Take 1 tablet by mouth 2 (two) times daily.  Marland Kitchen albuterol (PROVENTIL HFA;VENTOLIN HFA) 108 (90  Base) MCG/ACT inhaler INHALE 2 PUFFS INTO THE LUNGS EVERY 6 HOURS AS NEEDED FOR WHEEZING OR SHORTNESS OF BREATH  . albuterol (PROVENTIL) (2.5 MG/3ML) 0.083% nebulizer solution INHALE CONTENTS OF 1 VIAL VIA NEBULIZER EVERY 6 HOURS AS NEEDED FOR WHEEZING OR SHORTNESS OF BREATH  . Ascorbic Acid (VITAMIN C PO) Take 1 tablet by mouth daily.  Marland Kitchen aspirin 81 MG chewable tablet Chew 81 mg by mouth daily.   Marland Kitchen atenolol (TENORMIN) 25 MG tablet Take 1 tablet (25 mg total) by mouth daily.  Marland Kitchen atorvastatin (LIPITOR) 40 MG tablet TAKE 1 TABLET BY MOUTH DAILY AT 6 PM  . bumetanide (BUMEX) 0.5 MG tablet Take 1 tablet (0.5 mg total) by mouth daily.  . calcium-vitamin D (OSCAL WITH D) 500-200 MG-UNIT tablet Take 1 tablet by mouth daily.  Marland Kitchen conjugated estrogens (PREMARIN) vaginal cream Insert 0.5 grams vaginally once a wk.  . diclofenac sodium (VOLTAREN) 1 % GEL Apply 2 g topically 4 (four) times daily.  Marland Kitchen glucose blood (FREESTYLE TEST STRIPS) test strip Use as instructed for 3 times daily testing of blood glucose.  . hydrochlorothiazide (HYDRODIURIL) 25 MG tablet Take 1 tablet (25 mg total) by mouth daily.  . insulin lispro protamine-lispro (HUMALOG 75/25 MIX) (75-25) 100 UNIT/ML SUSP injection Inject subcutaneously 80 U in the morning with food and 55 U at in evening with meals  . INSULIN SYRINGE 1CC/29G 29G X 1/2" 1 ML MISC USE THREE TIMES DAILY AS DIRECTED (Patient taking differently: USE TWO TIMES DAILY AS DIRECTED)  . Lancets (FREESTYLE) lancets Use as directed 3 times daily. E11.9  . losartan (COZAAR) 50 MG tablet Take 1 tablet (50 mg total) by mouth daily.  . montelukast (SINGULAIR) 10 MG tablet TAKE 1 TABLET(10 MG) BY MOUTH DAILY  . Multiple Vitamin (MULTIVITAMIN WITH MINERALS) TABS tablet Take 1 tablet by mouth daily.  . nitroGLYCERIN (NITROSTAT) 0.3 MG SL tablet Place 1 tablet (0.3 mg total) under the tongue every 5 (five) minutes as needed for chest pain. Max 3 tablets in 15 minutes  . nizatidine (AXID) 150  MG capsule Take 1 capsule (150 mg total) by mouth at bedtime.  . ondansetron (ZOFRAN) 4 MG tablet Take 1 tablet (4 mg total) by mouth every 6 (six) hours.  . pantoprazole (PROTONIX) 40 MG tablet Take 1 tablet (40 mg total) by mouth daily. Take 30-60 min before first meal of the day  . Potassium Chloride ER 20 MEQ TBCR Take 20 mEq by mouth daily.  Marland Kitchen topiramate (TOPAMAX) 25 MG capsule Take 3 capsules (75 mg total) by mouth 2 (two) times daily.            Objective:   Physical Exam    amb obese bf flat affect nad  10/27/2017       227   09/09/17 228 lb (103.4 kg)  09/06/17 227 lb 3.2 oz (103.1 kg)  08/23/17 227 lb 12.8 oz (103.3 kg)     Vital signs reviewed - Note on arrival 02 sats  95% on RA and BP 112/68     HEENT: nl  turbinates bilaterally, and oropharynx. Nl external ear canals without cough reflex -Top denture, partial lower   NECK :  without JVD/Nodes/TM/ nl carotid upstrokes bilaterally   LUNGS: no acc muscle use,  Nl contour chest which is clear to A and P bilaterally without cough on insp or exp maneuvers   CV:  RRR  no s3 or murmur or increase in P2, and no edema   ABD:  Quite obese but soft and nontender with limited inspiratory excursion in the supine position. No bruits or organomegaly appreciated, bowel sounds nl  MS:  Nl gait/ ext warm without deformities, calf tenderness, cyanosis or clubbing No obvious joint restrictions   SKIN: warm and dry without lesions    NEURO:  alert, approp, nl sensorium with  no motor or cerebellar deficits apparent.     Labs ordered 10/27/2017   Allergy profile         Assessment:

## 2017-10-27 NOTE — Assessment & Plan Note (Signed)
ERV 6% 10/27/2017 pfts    Body mass index is 38.96 kg/m.  -  Trending   227  Lab Results  Component Value Date   TSH 0.627 11/26/2014     Contributing to gerd risk/ doe and very low ERV /reviewed the need and the process to achieve and maintain neg calorie balance > defer f/u primary care including intermittently monitoring thyroid status

## 2017-10-27 NOTE — Assessment & Plan Note (Signed)
-   pfts 12/12/13 done while sympt on ACEi should no obst or flattening of f/v curve - Spirometry 09/09/2017  FEV1 0.87 (44%)  Ratio 78 with min curvature w/in 2 h of study with symptoms at time of study  - FENO 09/09/2017  =   8  - trial off acei 09/09/2017   - PFT's  10/27/2017  FEV1 1.07 (54 % ) ratio 86  p no % improvement from saba p nothing  prior to study with DLCO  40 % corrects to 116  % for alv volume  - Allergy profile 10/27/2017 >  Eos 0. /  IgE    Nothing at all to support asthma so most likley this is Upper airway cough syndrome (previously labeled PNDS),  is so named because it's frequently impossible to sort out how much is  CR/sinusitis with freq throat clearing (which can be related to primary GERD)   vs  causing  secondary (" extra esophageal")  GERD from wide swings in gastric pressure that occur with throat clearing, often  promoting self use of mint and menthol lozenges that reduce the lower esophageal sphincter tone and exacerbate the problem further in a cyclical fashion.   These are the same pts (now being labeled as having "irritable larynx syndrome" by some cough centers) who not infrequently have a history of having failed to tolerate ace inhibitors,  dry powder inhalers or biphosphonates or report having atypical/extraesophageal reflux symptoms that don't respond to standard doses of PPI  and are easily confused as having aecopd or asthma flares by even experienced allergists/ pulmonologists (myself included).   rec allergy profile prn zyrtec, f/u with sinus CT if not better (vs ent eval)  I had an extended discussion with the patient reviewing all relevant studies completed to date and  lasting 15 to 20 minutes of a 25 minute visit    Each maintenance medication was reviewed in detail including most importantly the difference between maintenance and prns and under what circumstances the prns are to be triggered using an action plan format that is not reflected in the computer  generated alphabetically organized AVS.    Please see AVS for specific instructions unique to this visit that I personally wrote and verbalized to the the pt in detail and then reviewed with pt  by my nurse highlighting any  changes in therapy recommended at today's visit to their plan of care.

## 2017-10-27 NOTE — Telephone Encounter (Signed)
Urine drug screen for this encounter is consistent for prescribed medication 

## 2017-10-27 NOTE — Assessment & Plan Note (Signed)
Try off acei 09/09/2017 due to pseudoasthma> improved 10/27/2017   Although even in retrospect it may not be clear the ACEi contributed to the pt's symptoms,  Pt improved off them and adding them back at this point or in the future would risk confusion in interpretation of non-specific respiratory symptoms to which this patient is prone  ie  Better not to muddy the waters here.   Adequate control on present rx, reviewed in detail with pt > no change in rx needed  = losartan 50 mg daily and  Follow up per Primary Care planned

## 2017-10-27 NOTE — Progress Notes (Signed)
PFT completed 10/27/17

## 2017-10-27 NOTE — Patient Instructions (Signed)
Try zyrtec one hour before bed as needed for cough / drainage/ itching /sneezing    Please remember to go to the lab department downstairs in the basement  for your tests - we will call you with the results when they are available.       If you are satisfied with your treatment plan,  let your doctor know and he/she can either refill your medications or you can return here when your prescription runs out.     If in any way you are not 100% satisfied,  please tell us.  If 100% better, tell your friends!  Pulmonary follow up is as needed

## 2017-11-03 ENCOUNTER — Telehealth (INDEPENDENT_AMBULATORY_CARE_PROVIDER_SITE_OTHER): Payer: Self-pay | Admitting: Orthopedic Surgery

## 2017-11-03 ENCOUNTER — Encounter (INDEPENDENT_AMBULATORY_CARE_PROVIDER_SITE_OTHER): Payer: Self-pay | Admitting: Orthopedic Surgery

## 2017-11-03 ENCOUNTER — Ambulatory Visit (INDEPENDENT_AMBULATORY_CARE_PROVIDER_SITE_OTHER): Payer: Medicare Other | Admitting: Orthopedic Surgery

## 2017-11-03 DIAGNOSIS — S838X2D Sprain of other specified parts of left knee, subsequent encounter: Secondary | ICD-10-CM | POA: Diagnosis not present

## 2017-11-03 DIAGNOSIS — M25572 Pain in left ankle and joints of left foot: Secondary | ICD-10-CM

## 2017-11-03 NOTE — Telephone Encounter (Signed)
Patient said inaccurate information was in her clinical notes, about her stepping off a bus. She contacted [facility] imaging and they said Dr. Marlou Sa would have to change that to say she was stepping off of EMS ambulance and not a bus. She said she has to turn these notes in and it needs to be right. They will need the OV notes from todays visit and will pick them up Friday. # [phone]

## 2017-11-03 NOTE — Progress Notes (Signed)
Office Visit Note   Patient: Heidi [name]           Date of Birth: [birth year]           MRN: [id number] Visit Date: 11/03/2017 Requested by: Heidi Pier, MD [place], [place] [id number] PCP: Heidi Pier, MD  Subjective: Chief Complaint  Patient presents with  . Left Foot - Follow-up  . Left Ankle - Follow-up    HPI: Heidi [name] is a patient here to follow-up MRI scan of left ankle and foot.  Left ankle MRI scan shows chronic ATFL and CFL tears.  Left foot shows some mild talonavicular arthritis and little bit of sesamoiditis on the medial sesamoid.  No acute injury on either ankle or foot on the left-hand side.  The knee is still giving her trouble.  She has a lateral meniscal tear in that area.  She developed the symptoms following stepping off of an ambulance.              ROS: All systems reviewed are negative as they relate to the chief complaint within the history of present illness.  Patient denies  fevers or chills.   Assessment & Plan: Visit Diagnoses:  1. Pain in left ankle and joints of left foot   2. Injury of meniscus of left knee, subsequent encounter     Plan: Impression is chronic ATFL and CFL tears in association with injury following stepping off of ambulance.  Nothing really acute in the ankle or foot.  Difficult to tie these findings in the ankle and foot definitively to her injury several months ago.  She has had an ankle fracture there in the past which definitely muddy the water.  Plan at this time is for physical therapy for quad strengthening as well as left peroneal strengthening.  Hinged knee brace ASO and come back in 3 to 4 months.  Patient wants to get through the summer before she does any type of intervention.  We will see how she manages at that time.  Follow-Up Instructions: Return in about 4 months (around 03/06/2018).   Orders:  No orders of the defined types were placed in this encounter.  No orders of the defined types were  placed in this encounter.     Procedures: No procedures performed   Clinical Data: No additional findings.  Objective: Vital Signs: There were no vitals taken for this visit.  Physical Exam:   Constitutional: Patient appears well-developed HEENT:  Head: Normocephalic Eyes:EOM are normal Neck: Normal range of motion Cardiovascular: Normal rate Pulmonary/chest: Effort normal Neurologic: Patient is alert Skin: Skin is warm Psychiatric: Patient has normal mood and affect    Ortho Exam: Orthopedic exam demonstrates that the patient is using one crutch.  Left foot is examined.  She has mild tenderness under the sesamoid.  She has palpable pedal pulses.  She has good ankle dorsi flexion plantarflexion strength.  She does have tenderness medially and laterally and generally circumferentially around the ankle.  Nothing really discretely tender.  Some swelling on the dorsum of the foot is present but that is present on both sides.  Knee has pretty reasonable range of motion today.  At the left-hand side.  Specialty Comments:  No specialty comments available.  Imaging: No results found.   PMFS History: Patient Active Problem List   Diagnosis Date Noted  . Morbid obesity due to excess calories ([place]) complicated by hbp/ dm / hyperlipidemia 09/10/2017  . Cough variant asthma  vs UACS/vcd on ACEi  09/09/2017  . Anxiety and depression 09/06/2017  . Mild persistent asthma without complication 73/22/0254  . Paranoia ([place]) 09/06/2017  . Torn left ear lobe 11/20/2016  . Chronic migraine w/o aura w/o status migrainosus, not intractable 06/27/2016  . Post-menopausal atrophic vaginitis 11/14/2015  . Primary osteoarthritis of both knees 07/18/2015  . De Quervain's tenosynovitis, right 05/08/2015  . GERD (gastroesophageal reflux disease) 05/08/2015  . Renal cyst, right 05/08/2015  . Adenomatous polyp of colon 03/26/2015  . Essential hypertension 02/12/2015  . Cognitive deficit due to old  head trauma 02/12/2015  . Asthma, chronic 02/12/2015  . Chronic left shoulder pain 02/11/2015  . Thyroid nodule 12/18/2014  . Chronic neck pain   . HLD (hyperlipidemia)   . OSA treated with BiPAP 11/27/2014  . Obesity hypoventilation syndrome ([place]) 11/27/2014  . Demand ischemia ([place])   . Diabetes type 2, uncontrolled ([place])   . Cardiomegaly 11/25/2014  . CHF (congestive heart failure) ([place]) 11/25/2014  . DM type 2 causing CKD stage 2 ([place]) 11/25/2014   Past Medical History:  Diagnosis Date  . Adenomatous colon polyp   . Anemia   . Anxiety   . Asthma   . CHF (congestive heart failure) ([place])   . Diabetes mellitus without complication ([place])   . High cholesterol   . Hypertension   . Left knee injury   . Pneumonia     Family History  Problem Relation Age of Onset  . Colon cancer Father   . Diabetes type II Sister   . Diabetes type II Brother   . Colon cancer Sister   . Migraines Neg Hx     Past Surgical History:  Procedure Laterality Date  . ABDOMINAL HYSTERECTOMY    . CATARACT EXTRACTION Left   . CATARACT EXTRACTION     LT 01/2017, 02/2017 on RT  . CESAREAN SECTION     Social History   Occupational History  . Occupation: Disabled  Tobacco Use  . Smoking status: Never Smoker  . Smokeless tobacco: Never Used  Substance and Sexual Activity  . Alcohol use: No  . Drug use: No  . Sexual activity: Not on file

## 2017-11-04 NOTE — Telephone Encounter (Signed)
Have printed OV note.Marland Kitchen...will have to look further into the request for change of clinical information on the report of the ankle/foot MRI scans. As I told patient at [facility] we cannot alter that information as the report is not generated by [place].  I will speak with my supervisor to see what suggestions that she has.

## 2017-11-05 NOTE — Telephone Encounter (Signed)
I spoke with Heidi [name] this a.m. She was going to email someone at [facility] to see who patient needed to speak with about getting clinical information on the MRI reports of foot and ankle changed. As I had mentioned to patient, we are unable to change that information as we did not generate the report.

## 2017-11-05 NOTE — Addendum Note (Signed)
Addended byLaurann Montana on: 11/05/2017 02:18 PM   Modules accepted: Orders

## 2017-11-08 ENCOUNTER — Other Ambulatory Visit: Payer: Self-pay

## 2017-11-08 MED ORDER — ATORVASTATIN CALCIUM 40 MG PO TABS: ORAL_TABLET | ORAL | 0 refills | Status: DC

## 2017-11-09 DIAGNOSIS — M546 Pain in thoracic spine: Secondary | ICD-10-CM | POA: Diagnosis not present

## 2017-11-09 DIAGNOSIS — M542 Cervicalgia: Secondary | ICD-10-CM | POA: Diagnosis not present

## 2017-11-09 DIAGNOSIS — M5030 Other cervical disc degeneration, unspecified cervical region: Secondary | ICD-10-CM | POA: Diagnosis not present

## 2017-11-09 DIAGNOSIS — M5412 Radiculopathy, cervical region: Secondary | ICD-10-CM | POA: Diagnosis not present

## 2017-11-16 ENCOUNTER — Ambulatory Visit: Payer: Medicare Other | Admitting: Internal Medicine

## 2017-11-17 DIAGNOSIS — M542 Cervicalgia: Secondary | ICD-10-CM | POA: Diagnosis not present

## 2017-11-22 DIAGNOSIS — M5030 Other cervical disc degeneration, unspecified cervical region: Secondary | ICD-10-CM | POA: Diagnosis not present

## 2017-11-24 ENCOUNTER — Telehealth (INDEPENDENT_AMBULATORY_CARE_PROVIDER_SITE_OTHER): Payer: Self-pay | Admitting: Orthopedic Surgery

## 2017-11-24 NOTE — Telephone Encounter (Signed)
Patient called wanting Dr Marlou Sa to know that the ankle brace is hurting her left ankle.  Patient said she go up 13 steps and asked if Dr Marlou Sa will give her an injection. Patient said the knee brace is not working like it was. Patient asked if she can get a boot to wear instead. The number to contact patient is [phone]

## 2017-11-25 NOTE — Telephone Encounter (Signed)
IC patient and advised, she understands.  She will talk with daughter and find a time that is good to stop by for a nurse visit to get a fracture boot.

## 2017-11-25 NOTE — Telephone Encounter (Signed)
Ok for boot injection likely not helpful

## 2017-11-25 NOTE — Telephone Encounter (Signed)
Please advise 

## 2017-11-30 ENCOUNTER — Ambulatory Visit: Payer: Medicare Other | Attending: Orthopedic Surgery | Admitting: Physical Therapy

## 2017-12-01 ENCOUNTER — Other Ambulatory Visit: Payer: Self-pay

## 2017-12-01 DIAGNOSIS — J45909 Unspecified asthma, uncomplicated: Secondary | ICD-10-CM

## 2017-12-01 MED ORDER — MONTELUKAST SODIUM 10 MG PO TABS: ORAL_TABLET | ORAL | 0 refills | Status: DC

## 2017-12-03 ENCOUNTER — Other Ambulatory Visit: Payer: Self-pay | Admitting: Internal Medicine

## 2017-12-06 ENCOUNTER — Telehealth: Payer: Self-pay | Admitting: *Deleted

## 2017-12-06 ENCOUNTER — Telehealth: Payer: Self-pay | Admitting: Internal Medicine

## 2017-12-06 ENCOUNTER — Other Ambulatory Visit: Payer: Self-pay | Admitting: Internal Medicine

## 2017-12-06 NOTE — Telephone Encounter (Signed)
Ok to give 6 months of refills but let her know after that the PCP can evaluate for ongoing need or will need to return here for [place] to sort out this issue

## 2017-12-06 NOTE — Telephone Encounter (Signed)
Pt is calling back [phone]

## 2017-12-06 NOTE — Telephone Encounter (Signed)
Refill for Lisinopril 5 mg. Did not see were patient is taking this medication and concerns with renal function in lab note . Pt advise.

## 2017-12-06 NOTE — Telephone Encounter (Signed)
Attempted to call patient, no answer, message left to call back.  

## 2017-12-06 NOTE — Telephone Encounter (Signed)
Left voice mail on machine for patient to return phone call back regarding RX refill for Adix. X1

## 2017-12-06 NOTE — Telephone Encounter (Signed)
Called and spoke with patient, she is needing a refill of the Axid. Per last OV note patient was to follow up with [place] as needed.    MW please advise on patients refill is this ok to do, thank you.

## 2017-12-07 ENCOUNTER — Ambulatory Visit: Payer: Medicare Other | Admitting: Physical Therapy

## 2017-12-07 MED ORDER — NIZATIDINE 150 MG PO CAPS: 150.0000 mg | ORAL_CAPSULE | Freq: Every day | ORAL | 5 refills | Status: DC

## 2017-12-07 NOTE — Telephone Encounter (Signed)
Called pt and advised message from the provider. Pt understood and verbalized understanding. Nothing further is needed.    Rx sent in. 

## 2017-12-09 DIAGNOSIS — M5030 Other cervical disc degeneration, unspecified cervical region: Secondary | ICD-10-CM | POA: Diagnosis not present

## 2017-12-13 ENCOUNTER — Encounter: Payer: Self-pay | Admitting: Physical Therapy

## 2017-12-13 ENCOUNTER — Ambulatory Visit: Payer: Medicare Other | Attending: Orthopedic Surgery | Admitting: Physical Therapy

## 2017-12-13 ENCOUNTER — Other Ambulatory Visit: Payer: Self-pay

## 2017-12-13 DIAGNOSIS — M6281 Muscle weakness (generalized): Secondary | ICD-10-CM | POA: Insufficient documentation

## 2017-12-13 DIAGNOSIS — G8929 Other chronic pain: Secondary | ICD-10-CM | POA: Diagnosis not present

## 2017-12-13 DIAGNOSIS — R262 Difficulty in walking, not elsewhere classified: Secondary | ICD-10-CM | POA: Diagnosis not present

## 2017-12-13 DIAGNOSIS — M25572 Pain in left ankle and joints of left foot: Secondary | ICD-10-CM | POA: Insufficient documentation

## 2017-12-13 DIAGNOSIS — M25562 Pain in left knee: Secondary | ICD-10-CM | POA: Diagnosis not present

## 2017-12-13 NOTE — Patient Instructions (Signed)
   Seated LAQ with band  Start sitting with band under arch of unaffected leg and around ankle of affected leg. Straighten your leg and slowly return to starting position keeping knees tight together.  Repeat 10 times each leg with the red band, twice a day.      ELASTIC BAND - HAMSTRING CURL  While seated and an elastic band attched to your ankle, bend your knee and draw back your foot.  Use the red band.  Repeat 10 times each leg, twice a day.     ANKLE CIRCLES  Move your ankle in a circular pattern one direction for several repetitions and then reverse the direction.   Repeat 10-15 times with your left ankle, twice a day.

## 2017-12-13 NOTE — Therapy (Signed)
[facility] [place], [place], [address] Phone: [phone]   Fax:  [phone]  Physical Therapy Evaluation  Patient Details  Name: Heidi [name] MRN: [id number] Date of Birth: Jan 04, 1951 Referring Provider: Meredith Pel    Encounter Date: 12/13/2017  PT End of Session - 12/13/17 1240    Visit Number  1    Number of Visits  13    Date for PT Re-Evaluation  01/10/18    Authorization Type  Medicare/Medicaid (PN visit 1, KX visit 91)    Authorization Time Period  12/13/17 to 01/24/18    Authorization - Visit Number  1    Authorization - Number of Visits  10    PT Start Time  1050    PT Stop Time  1135    PT Time Calculation (min)  45 min    Activity Tolerance  Patient tolerated treatment well    Behavior During Therapy  [facility] for tasks assessed/performed       Past Medical History:  Diagnosis Date  . Adenomatous colon polyp   . Anemia   . Anxiety   . Asthma   . CHF (congestive heart failure) ([place])   . Diabetes mellitus without complication ([place])   . High cholesterol   . Hypertension   . Left knee injury   . Pneumonia     Past Surgical History:  Procedure Laterality Date  . ABDOMINAL HYSTERECTOMY    . CATARACT EXTRACTION Left   . CATARACT EXTRACTION     LT 01/2017, 02/2017 on RT  . CESAREAN SECTION      There were no vitals filed for this visit.   Subjective Assessment - 12/13/17 1049    Subjective  I have torn some ligaments in my knee and ankle; I'm in a knee brace and I'm supposed to be in one for my ankle but it hurts so I may be getting a boot. I am getting a long boot soon, my daughter has yet to pick it up. I got injured stepping off of an ambulance.     How long can you sit comfortably?  "I sit OK", no limits     How long can you stand comfortably?  could stand to wash dishes or cook, could stand 30 minutes     How long can you walk comfortably?  without brace, have to walk slowly, had to  walk [place]     Patient Stated Goals  wear heels again, be able to walk more and better, get stronger     Currently in Pain?  Yes    Pain Score  5     Pain Location  Ankle    Pain Orientation  Left    Pain Descriptors / Indicators  Burning;Discomfort    Pain Type  Chronic pain    Pain Radiating Towards  comes up leg to the knee and hip     Pain Onset  More than a month ago    Pain Frequency  Constant    Aggravating Factors   walking, stairs     Pain Relieving Factors  getting off of it, sports cream     Effect of Pain on Daily Activities  moderate          [facility] PT Assessment - 12/13/17 0001      Assessment   Medical Diagnosis  L knee and ankle pain     Referring Provider  Meredith Pel  Onset Date/Surgical Date  -- February 9th, 2019     Next MD Visit  Dr. Marlou Sa in August     Prior Therapy  none for this condition       Precautions   Precautions  None      Restrictions   Weight Bearing Restrictions  No      Balance Screen   Has the patient fallen in the past 6 months  No    Has the patient had a decrease in activity level because of a fear of falling?   Yes    Is the patient reluctant to leave their home because of a fear of falling?   No      Prior Function   Level of Independence  Independent;Independent with basic ADLs;Requires assistive device for independence;Independent with transfers;Independent with gait    Vocation  Retired    Tax adviser, going on trips, etc       Observation/Other Assessments   Observations  TTP area of injured ankle ligaments, tender and mildly lax ACL L knee, more laxiity L ankle with anterior drawer       ROM / Strength   AROM / PROM / Strength  AROM;Strength      AROM   AROM Assessment Site  Knee;Ankle    Right/Left Knee  Right;Left    Left Knee Extension  -3    Left Knee Flexion  127    Right/Left Ankle  Right;Left    Left Ankle Dorsiflexion  2    Left Ankle Plantar Flexion  61     Left Ankle Inversion  9    Left Ankle Eversion  6      Strength   Strength Assessment Site  Knee;Ankle;Hip    Right/Left Hip  Right;Left    Right Hip Flexion  3+/5    Right Hip Extension  3/5    Right Hip ABduction  3/5    Left Hip Flexion  3+/5    Left Hip Extension  3+/5    Left Hip ABduction  4/5    Right/Left Knee  Right;Left    Right Knee Flexion  3+/5    Right Knee Extension  4/5    Left Knee Flexion  4+/5    Left Knee Extension  4+/5    Right/Left Ankle  Right;Left    Right Ankle Dorsiflexion  5/5    Right Ankle Inversion  5/5    Right Ankle Eversion  5/5    Left Ankle Dorsiflexion  5/5    Left Ankle Inversion  5/5    Left Ankle Eversion  5/5                Objective measurements completed on examination: See above findings.      [facility] Adult PT Treatment/Exercise - 12/13/17 0001      Exercises   Exercises  Knee/Hip      Knee/Hip Exercises: Seated   Long Arc Quad  Both;1 set;10 reps;Other (comment) red TB     Other Seated Knee/Hip Exercises  seated ankle circles x10 L     Hamstring Curl  Both;1 set;10 reps;Other (comment) red TB              PT Education - 12/13/17 1239    Education Details  exam findings, prognosis, POC, HEP    Person(s) Educated  Patient    Methods  Explanation;Demonstration;Handout;Verbal cues    Comprehension  Verbalized understanding;Need further instruction;Returned demonstration  PT Short Term Goals - 12/13/17 1242      PT SHORT TERM GOAL #1   Title  Patient to be compliant with appropriate HEP, to be updated PRN     Time  1    Period  Weeks    Status  New    Target Date  12/20/17      PT SHORT TERM GOAL #2   Title  Patient to improve L ankle dorsiflexion to at least 10 degrees without pain in order to improve gait mechanics and balance     Time  3    Period  Weeks    Status  New    Target Date  01/03/18      PT SHORT TERM GOAL #3   Title  Patient to improve L ankle inversion and eversion by 10  degrees without pain in order to improve gait mechanics and balance     Time  3    Period  Weeks    Status  New        PT Long Term Goals - 12/13/17 1244      PT LONG TERM GOAL #1   Title  Patient to improve functional strength by 1 MMT grade on all tested planes in order to reduce pain and improve tolerance to activity     Time  6    Period  Weeks    Status  New    Target Date  01/24/18      PT LONG TERM GOAL #2   Title  Patient to report pain as being no more than 2/10 L ankle and knee in order to improve QOL and activity tolerance     Time  6    Period  Weeks    Status  New      PT LONG TERM GOAL #3   Title  Patient to be able to ambulate at least 1037ft without assisstive device without pain and with no loss of balance in order to show improved gait and balance skills     Time  6    Period  Weeks    Status  New      PT LONG TERM GOAL #4   Title  Patient to demonstrate improved ankle stability and balance as demonstrated by ability to maintain SLS for at least 30 seconds on solid surface     Time  6    Period  Weeks    Status  New             Plan - 12/13/17 1241    Clinical Impression Statement  Patient arrives with L ankle and knee pain; per her report and MD note, she sustained L ankle lateral ligament tears and L knee meniscal injury when stepping off of an ambulance in February 2019. Of note, she arrives today with frayed knee brace and in dress shoes with poor support for her ankle. Examination reveals L ankle stiffness, generalized weakness, mild unsteadiness with gait, and pain L knee and ankle; also note bilateral pedal edema. She will benefit from skilled PT services to address functional deficits, reduce pain, and promote normalized movement pattern moving forward.     History and Personal Factors relevant to plan of care:  ligament tears in lateral L ankle, meniscus injury L knee February 2019    Clinical Presentation  Stable    Clinical Decision Making   Low    Rehab Potential  Fair    PT Frequency  2x /  week    PT Duration  6 weeks    PT Treatment/Interventions  ADLs/Self Care Home Management;Biofeedback;Cryotherapy;Electrical Stimulation;Iontophoresis 4mg /ml Dexamethasone;Moist Heat;Ultrasound;DME Instruction;Gait training;Stair training;Functional mobility training;Therapeutic activities;Therapeutic exercise;Balance training;Neuromuscular re-education;Patient/family education;Manual techniques;Scar mobilization;Passive range of motion;Dry needling;Taping    PT Next Visit Plan  review HEP and goals, expand HEP if she has been compliant iwth good form. L anlke mobility, otherwise general strength and balance program     PT Home Exercise Plan  Eval: red TB LAQs and hamstring curls, ankle circles L     Consulted and Agree with Plan of Care  Patient       Patient will benefit from skilled therapeutic intervention in order to improve the following deficits and impairments:  Abnormal gait, Improper body mechanics, Pain, Decreased coordination, Decreased mobility, Increased muscle spasms, Postural dysfunction, Decreased activity tolerance, Decreased range of motion, Decreased strength, Hypomobility, Decreased balance, Difficulty walking, Impaired flexibility  Visit Diagnosis: Pain in left ankle and joints of left foot - Plan: PT plan of care cert/re-cert  Chronic pain of left knee - Plan: PT plan of care cert/re-cert  Muscle weakness (generalized) - Plan: PT plan of care cert/re-cert  Difficulty in walking, not elsewhere classified - Plan: PT plan of care cert/re-cert     Problem List Patient Active Problem List   Diagnosis Date Noted  . Morbid obesity due to excess calories ([place]) complicated by hbp/ dm / hyperlipidemia 09/10/2017  . Cough variant asthma vs UACS/vcd on ACEi  09/09/2017  . Anxiety and depression 09/06/2017  . Mild persistent asthma without complication 93/57/0177  . Paranoia ([place]) 09/06/2017  . Torn left ear lobe  11/20/2016  . Chronic migraine w/o aura w/o status migrainosus, not intractable 06/27/2016  . Post-menopausal atrophic vaginitis 11/14/2015  . Primary osteoarthritis of both knees 07/18/2015  . De Quervain's tenosynovitis, right 05/08/2015  . GERD (gastroesophageal reflux disease) 05/08/2015  . Renal cyst, right 05/08/2015  . Adenomatous polyp of colon 03/26/2015  . Essential hypertension 02/12/2015  . Cognitive deficit due to old head trauma 02/12/2015  . Asthma, chronic 02/12/2015  . Chronic left shoulder pain 02/11/2015  . Thyroid nodule 12/18/2014  . Chronic neck pain   . HLD (hyperlipidemia)   . OSA treated with BiPAP 11/27/2014  . Obesity hypoventilation syndrome ([place]) 11/27/2014  . Demand ischemia ([place])   . Diabetes type 2, uncontrolled ([place])   . Cardiomegaly 11/25/2014  . CHF (congestive heart failure) ([place]) 11/25/2014  . DM type 2 causing CKD stage 2 ([place]) 11/25/2014    Deniece Ree PT, DPT, CBIS  Supplemental Physical Therapist [place]   Pager [place] [facility] [address] [place], [place], [address] Phone: [phone]   Fax:  [phone]  Name: Heidi [name] MRN: [id number] Date of Birth: 18-Nov-1950

## 2017-12-15 ENCOUNTER — Encounter: Payer: Self-pay | Admitting: Neurology

## 2017-12-15 ENCOUNTER — Ambulatory Visit (INDEPENDENT_AMBULATORY_CARE_PROVIDER_SITE_OTHER): Payer: Medicare Other | Admitting: Neurology

## 2017-12-15 VITALS — BP 110/71 | HR 73 | Wt 228.0 lb

## 2017-12-15 DIAGNOSIS — G459 Transient cerebral ischemic attack, unspecified: Secondary | ICD-10-CM | POA: Diagnosis not present

## 2017-12-15 DIAGNOSIS — G43709 Chronic migraine without aura, not intractable, without status migrainosus: Secondary | ICD-10-CM

## 2017-12-15 DIAGNOSIS — R200 Anesthesia of skin: Secondary | ICD-10-CM

## 2017-12-15 DIAGNOSIS — R9082 White matter disease, unspecified: Secondary | ICD-10-CM

## 2017-12-15 NOTE — Progress Notes (Signed)
Botox- 100 units x 2 vials Lot: [id number] Expiration: 05/2020 NDC: 0023-1145-01  Bacteriostatic 0.9% Sodium Chloride- 4mL total Lot: [id number] Expiration: 11/04/2018 NDC: 0409-1966-02  Dx: G43.709 B/B    

## 2017-12-15 NOTE — Progress Notes (Addendum)
In 2015 she had acute onset numbness of the face. Progressive. She can't feel anything, needs a mirror to see if naything is on her face. Worsening, having problems with saliva. She also had a loss of consciousness with alteration of awareness about a year ago. No hx of seizures. She says she had a TIA. Her MRI did show extensive white matter changes. ldl 31, triglycerides 167. HgbA1c 11 in 06/04/2017. Need MRI brain to follow abnormal white mater changes as well as numbness of the face. She declines. Discussed vascula risk factors, takes daily asa 81.   I had a long d/w patient about stroke risk, personally independently reviewed imaging studies and stroke evaluation results and answered questions.Continue ASA for secondary stroke prevention and maintain strict control of hypertension with blood pressure goal below 130/90, diabetes with hemoglobin A1c goal below 6.5% and lipids with LDL cholesterol goal below 70 mg/dL. Recommend she see her primary MD to get prescription. I also advised the patient to eat a healthy diet with plenty of whole grains, cereals, fruits and vegetables, exercise regularly and maintain ideal body weight .  MRI report from 2015: reviewed report   There is no MRI correlate to the finding on the CT of 10 May 2014--this instead pertain to artifact related to slice selection.  There are moderately extensive supratentorial and pontine white matter signal abnormalities consequent to chronic microvascular ischemic demyelination. There is slight bilateral frontal lobe predominant volume loss.  There is no compelling MRI evidence of masses, edema, infarction, hemorrhage, extra-axial fluid collections, midline abnormality, hydrocephalus, herniation, or unexpected enhancement. The major vascular structures are grossly unremarkable.  There is mild mucosal thickening throughout the ethmoid air cells. The left frontal sinus is hypoplastic, a normal variant. The paranasal sinuses are  otherwise well-aerated as are the middle tympanic cavities and the mastoid air cells.  A total of 25 minutes was spent face-to-face with this patient. Over half this time was spent on counseling patient on the facial numbness, TIA diagnosis and different diagnostic and therapeutic options, counseling and coordination of care, risks ans benefits of management, compliance, or risk factor reduction and education. This does not include time spent  On emg/ncs.

## 2017-12-15 NOTE — Addendum Note (Signed)
Addended by: Sarina Ill B on: 12/15/2017 12:10 PM   Modules accepted: Level of Service

## 2017-12-15 NOTE — Progress Notes (Signed)
Interval history: Patient with chronic intractable migraines. Doing exceptionally well on Botox for Migraines. Prior tried multiple classes of medications which did not help with headaches or migraines until she started Botox. Since starting botox she has had  >75% improvement in frequency and severity. The migraines she does have are sparse are easier to treat and >50% less severe.Nothing has worked in the past except botox, I recommend life-long treatment with botox for migraines as well as Topiramate long-term for chronic intractable headaches only ever controlled with Botox an dTopiramate.   Consent Form Botulism Toxin Injection For Chronic Migraine  Botulism toxin has been approved by the Federal drug administration for treatment of chronic migraine. Botulism toxin does not cure chronic migraine and it may not be effective in some patients.  The administration of botulism toxin is accomplished by injecting a small amount of toxin into the muscles of the neck and head. Dosage must be titrated for each individual. Any benefits resulting from botulism toxin tend to wear off after 3 months with a repeat injection required if benefit is to be maintained. Injections are usually done every 3-4 months with maximum effect peak achieved by about 2 or 3 weeks. Botulism toxin is expensive and you should be sure of what costs you will incur resulting from the injection.  The side effects of botulism toxin use for chronic migraine may include:   -Transient, and usually mild, facial weakness with facial injections  -Transient, and usually mild, head or neck weakness with head/neck injections  -Reduction or loss of forehead facial animation due to forehead muscle              weakness  -Eyelid drooping  -Dry eye  -Pain at the site of injection or bruising at the site of injection  -Double vision  -Potential unknown long term risks  Contraindications: You should not have Botox if you are pregnant,  nursing, allergic to albumin, have an infection, skin condition, or muscle weakness at the site of the injection, or have myasthenia gravis, Lambert-Eaton syndrome, or ALS.  It is also possible that as with any injection, there may be an allergic reaction or no effect from the medication. Reduced effectiveness after repeated injections is sometimes seen and rarely infection at the injection site may occur. All care will be taken to prevent these side effects. If therapy is given over a long time, atrophy and wasting in the muscle injected may occur. Occasionally the patient's become refractory to treatment because they develop antibodies to the toxin. In this event, therapy needs to be modified.  I have read the above information and consent to the administration of botulism toxin.    ______________  _____   _________________  Patient signature     Date   Witness signature       BOTOX PROCEDURE NOTE FOR MIGRAINE HEADACHE    Contraindications and precautions discussed with patient(above). Aseptic procedure was observed and patient tolerated procedure. Procedure performed by Dr. Georgia Dom  The condition has existed for more than 6 months, and pt does not have a diagnosis of ALS, Myasthenia Gravis or Lambert-Eaton Syndrome. Risks and benefits of injections discussed and pt agrees to proceed with the procedure. Written consent obtained  These injections are medically necessary. He receives good benefits from these injections. These injections do not cause sedations or hallucinations which the oral therapies may cause.  Indication/Diagnosis: chronic migraine BOTOX(J0585) injection was performed according to protocol by [employer]. 200 units of BOTOX was  dissolved into 4 cc NS.  NDC: [address]-31  Description of procedure:  The patient was placed in a sitting position. The standard protocol was used for Botox as follows, with 5 units of Botox injected at each site:   -Procerus muscle,  midline injection  -Corrugator muscle, bilateral injection  -Frontalis muscle, bilateral injection, with 2 sites each side, medial injection was performed in the upper one third of the frontalis muscle, in the region vertical from the medial inferior edge of the superior orbital rim. The lateral injection was again in the upper one third of the forehead vertically above the lateral limbus of the cornea, 1.5 cm lateral to the medial injection site.  -Temporalis muscle injection, 4 sites, bilaterally. The first injection was 3 cm above the tragus of the ear, second injection site was 1.5 cm to 3 cm up from the first injection site in line with the tragus of the ear. The third injection site was 1.5-3 cm forward between the first 2 injection sites. The fourth injection site was 1.5 cm posterior to the second injection site.  -Occipitalis muscle injection, 3 sites, bilaterally. The first injection was done one half way between the occipital protuberance and the tip of the mastoid process behind the ear. The second injection site was done lateral and superior to the first, 1 fingerbreadth from the first injection. The third injection site was 1 fingerbreadth superiorly and medially from the first injection site.  -Cervical paraspinal muscle injection, 2 sites, bilateral knee first injection site was 1 cm from the midline of the cervical spine, 3 cm inferior to the lower border of the occipital protuberance. The second injection site was 1.5 cm superiorly and laterally to the first injection site.  -Trapezius muscle injection was performed at 3 sites, bilaterally. The first injection site was in the upper trapezius muscle halfway between the inflection point of the neck, and the acromion. The second injection site was one half way between the acromion and the first injection site. The third injection was done between the first injection site and the inflection point of the neck.   Will return for repeat  injection in 3 months.   A 200 unit sof Botox was used, 155 units were injected, the rest of the Botox was wasted. The patient tolerated the procedure well, there were no complications of the above procedure.

## 2017-12-21 ENCOUNTER — Other Ambulatory Visit: Payer: Self-pay | Admitting: Internal Medicine

## 2017-12-21 ENCOUNTER — Ambulatory Visit (HOSPITAL_COMMUNITY)
Admission: EM | Admit: 2017-12-21 | Discharge: 2017-12-21 | Disposition: A | Discharge status: Home / Self Care | Payer: Medicare Other | Attending: Internal Medicine | Admitting: Internal Medicine

## 2017-12-21 ENCOUNTER — Encounter (HOSPITAL_COMMUNITY): Payer: Self-pay

## 2017-12-21 DIAGNOSIS — E1165 Type 2 diabetes mellitus with hyperglycemia: Secondary | ICD-10-CM

## 2017-12-21 DIAGNOSIS — J069 Acute upper respiratory infection, unspecified: Secondary | ICD-10-CM | POA: Diagnosis not present

## 2017-12-21 DIAGNOSIS — IMO0002 Reserved for concepts with insufficient information to code with codable children: Secondary | ICD-10-CM

## 2017-12-21 MED ORDER — CETIRIZINE HCL 10 MG PO TABS: 10.0000 mg | ORAL_TABLET | Freq: Every day | ORAL | 0 refills | Status: DC

## 2017-12-21 MED ORDER — FLUTICASONE PROPIONATE 50 MCG/ACT NA SUSP: 1.0000 | Freq: Every day | NASAL | 2 refills | Status: DC

## 2017-12-21 NOTE — ED Provider Notes (Signed)
[facility]    CSN: [id number] Arrival date & time: 12/21/17  1006     History   Chief Complaint Chief Complaint  Patient presents with  . Nasal Congestion  . Facial Pain    HPI Heidi [name] is a 67 y.o. female.   Heidi [name] presents with complaints of fatigue, congestion, cough due to post nasal drip, ear congestion, runny nose and sneezing which started three days ago. No fevers. Chills occasionally. Cough is worse at night, productive of mucus. Has not worsened but has not improved. No known ill contacts. Denies gi/gu complaints. Has been taking mucinex d. She states she thinks she still takes singulair daily.  Hx anxiety, chf, dm, htn, migraines   ROS per HPI.      Past Medical History:  Diagnosis Date  . Adenomatous colon polyp   . Anemia   . Anxiety   . Asthma   . CHF (congestive heart failure) ([place])   . Diabetes mellitus without complication ([place])   . High cholesterol   . Hypertension   . Left knee injury   . Pneumonia     Patient Active Problem List   Diagnosis Date Noted  . Morbid obesity due to excess calories ([place]) complicated by hbp/ dm / hyperlipidemia 09/10/2017  . Cough variant asthma vs UACS/vcd on ACEi  09/09/2017  . Anxiety and depression 09/06/2017  . Mild persistent asthma without complication 12/27/7626  . Paranoia ([place]) 09/06/2017  . Torn left ear lobe 11/20/2016  . Chronic migraine w/o aura w/o status migrainosus, not intractable 06/27/2016  . Post-menopausal atrophic vaginitis 11/14/2015  . Primary osteoarthritis of both knees 07/18/2015  . De Quervain's tenosynovitis, right 05/08/2015  . GERD (gastroesophageal reflux disease) 05/08/2015  . Renal cyst, right 05/08/2015  . Adenomatous polyp of colon 03/26/2015  . Essential hypertension 02/12/2015  . Cognitive deficit due to old head trauma 02/12/2015  . Asthma, chronic 02/12/2015  . Chronic left shoulder pain 02/11/2015  . Thyroid nodule 12/18/2014  . Chronic neck pain   .  HLD (hyperlipidemia)   . OSA treated with BiPAP 11/27/2014  . Obesity hypoventilation syndrome ([place]) 11/27/2014  . Demand ischemia ([place])   . Diabetes type 2, uncontrolled ([place])   . Cardiomegaly 11/25/2014  . CHF (congestive heart failure) ([place]) 11/25/2014  . DM type 2 causing CKD stage 2 ([place]) 11/25/2014    Past Surgical History:  Procedure Laterality Date  . ABDOMINAL HYSTERECTOMY    . CATARACT EXTRACTION Left   . CATARACT EXTRACTION     LT 01/2017, 02/2017 on RT  . CESAREAN SECTION      OB History   None      Home Medications    Prior to Admission medications   Medication Sig Start Date End Date Taking? Authorizing Provider  Acetaminophen-Codeine 300-30 MG tablet Take 1 tablet by mouth 2 (two) times daily. 10/19/17  Yes Kirsteins, Luanna Salk, MD  albuterol (PROVENTIL HFA;VENTOLIN HFA) 108 (90 Base) MCG/ACT inhaler INHALE 2 PUFFS INTO THE LUNGS EVERY 6 HOURS AS NEEDED FOR WHEEZING OR SHORTNESS OF BREATH 09/06/17  Yes Ladell Pier, MD  albuterol (PROVENTIL) (2.5 MG/3ML) 0.083% nebulizer solution INHALE CONTENTS OF 1 VIAL VIA NEBULIZER EVERY 6 HOURS AS NEEDED FOR WHEEZING OR SHORTNESS OF BREATH 10/07/17  Yes Ladell Pier, MD  Ascorbic Acid (VITAMIN C PO) Take 1 tablet by mouth daily.   Yes [provider]  aspirin 81 MG chewable tablet Chew 81 mg by mouth daily.    Yes  [provider]  atenolol (TENORMIN) 25 MG tablet Take 1 tablet (25 mg total) by mouth daily. 02/26/17  Yes Ladell Pier, MD  atorvastatin (LIPITOR) 40 MG tablet Take 1 tablet by mouth daily at 6pm 11/08/17  Yes Ladell Pier, MD  bumetanide (BUMEX) 0.5 MG tablet Take 1 tablet (0.5 mg total) by mouth daily. 02/26/17  Yes Ladell Pier, MD  calcium-vitamin D (OSCAL WITH D) 500-200 MG-UNIT tablet Take 1 tablet by mouth daily. 06/04/17  Yes Ladell Pier, MD  conjugated estrogens (PREMARIN) vaginal cream Insert 0.5 grams vaginally once a wk. 05/19/17  Yes Ladell Pier, MD    diclofenac sodium (VOLTAREN) 1 % GEL Apply 2 g topically 4 (four) times daily. 08/23/17  Yes Ladell Pier, MD  glucose blood (FREESTYLE TEST STRIPS) test strip Use as instructed for 3 times daily testing of blood glucose. 05/31/17  Yes Ladell Pier, MD  hydrochlorothiazide (HYDRODIURIL) 25 MG tablet Take 1 tablet (25 mg total) by mouth daily. 02/26/17  Yes Ladell Pier, MD  insulin lispro protamine-lispro (HUMALOG 75/25 MIX) (75-25) 100 UNIT/ML SUSP injection Inject subcutaneously 80 U in the morning with food and 55 U at in evening with meals 09/06/17  Yes Ladell Pier, MD  INSULIN SYRINGE 1CC/29G 29G X 1/2" 1 ML MISC USE THREE TIMES DAILY AS DIRECTED Patient taking differently: USE TWO TIMES DAILY AS DIRECTED 08/16/17  Yes Ladell Pier, MD  Lancets (FREESTYLE) lancets Use as directed 3 times daily. E11.9 06/08/17  Yes Ladell Pier, MD  losartan (COZAAR) 50 MG tablet Take 1 tablet (50 mg total) by mouth daily. 09/09/17  Yes Tanda Rockers, MD  montelukast (SINGULAIR) 10 MG tablet TAKE 1 TABLET(10 MG) BY MOUTH DAILY 12/01/17  Yes Ladell Pier, MD  Multiple Vitamin (MULTIVITAMIN WITH MINERALS) TABS tablet Take 1 tablet by mouth daily.   Yes [provider]  nizatidine (AXID) 150 MG capsule Take 1 capsule (150 mg total) by mouth at bedtime. 12/07/17  Yes Tanda Rockers, MD  pantoprazole (PROTONIX) 40 MG tablet TAKE 1 TABLET(40 MG) BY MOUTH DAILY 30 TO 60 MINUTES BEFORE FIRST MEAL OF THE DAY 12/06/17  Yes Tanda Rockers, MD  Potassium Chloride ER 20 MEQ TBCR Take 20 mEq by mouth daily. 09/16/17  Yes Ladell Pier, MD  topiramate (TOPAMAX) 25 MG capsule Take 3 capsules (75 mg total) by mouth 2 (two) times daily. 01/12/17  Yes Melvenia Beam, MD  cetirizine (ZYRTEC) 10 MG tablet Take 1 tablet (10 mg total) by mouth daily. 12/21/17   Zigmund Gottron, NP  fluticasone (FLONASE) 50 MCG/ACT nasal spray Place 1 spray into both nostrils daily. 12/21/17   Zigmund Gottron, NP  nitroGLYCERIN (NITROSTAT) 0.3 MG SL tablet Place 1 tablet (0.3 mg total) under the tongue every 5 (five) minutes as needed for chest pain. Max 3 tablets in 15 minutes 08/07/16   Boykin Nearing, MD    Family History Family History  Problem Relation Age of Onset  . Colon cancer Father   . Diabetes type II Sister   . Diabetes type II Brother   . Colon cancer Sister   . Migraines Neg Hx     Social History Social History   Tobacco Use  . Smoking status: Never Smoker  . Smokeless tobacco: Never Used  Substance Use Topics  . Alcohol use: No  . Drug use: No     Allergies   Patient has  no known allergies.   Review of Systems Review of Systems   Physical Exam Triage Vital Signs ED Triage Vitals [12/21/17 1024]  Enc Vitals Group     BP 110/73     Pulse Rate 81     Resp 18     Temp 99 F (37.2 C)     Temp src      SpO2 96 %     Weight      Height      Head Circumference      Peak Flow      Pain Score 0     Pain Loc      Pain Edu?      Excl. in [place]?    No data found.  Updated Vital Signs BP 110/73   Pulse 81   Temp 99 F (37.2 C)   Resp 18   SpO2 96%   Visual Acuity Right Eye Distance:   Left Eye Distance:   Bilateral Distance:    Right Eye Near:   Left Eye Near:    Bilateral Near:     Physical Exam  Constitutional: She is oriented to person, place, and time. She appears well-developed and well-nourished. No distress.  HENT:  Head: Normocephalic and atraumatic.  Right Ear: Tympanic membrane, external ear and ear canal normal.  Left Ear: Tympanic membrane, external ear and ear canal normal.  Nose: Rhinorrhea present. Right sinus exhibits no maxillary sinus tenderness and no frontal sinus tenderness. Left sinus exhibits no maxillary sinus tenderness and no frontal sinus tenderness.  Mouth/Throat: Uvula is midline, oropharynx is clear and moist and mucous membranes are normal. No tonsillar exudate.  Eyes: Pupils are equal, round, and  reactive to light. Conjunctivae and EOM are normal.  Cardiovascular: Normal rate, regular rhythm and normal heart sounds.  Pulmonary/Chest: Effort normal and breath sounds normal.  Neurological: She is alert and oriented to person, place, and time.  Skin: Skin is warm and dry.     UC Treatments / Results  Labs (all labs ordered are listed, but only abnormal results are displayed) Labs Reviewed - No data to display  EKG None  Radiology No results found.  Procedures Procedures (including critical care time)  Medications Ordered in UC Medications - No data to display  Initial Impression / Assessment and Plan / UC Course  I have reviewed the triage vital signs and the nursing notes.  Pertinent labs & imaging results that were available during my care of the patient were reviewed by me and considered in my medical decision making (see chart for details).     Non toxic in appearance. Benign physical findings. History and physical consistent with viral illness.  Supportive cares recommended. Patient verbalized understanding and agreeable to plan.    Final Clinical Impressions(s) / UC Diagnoses   Final diagnoses:  Upper respiratory tract infection, unspecified type     Discharge Instructions     Push fluids to ensure adequate hydration and keep secretions thin.  Tylenol and/or ibuprofen as needed for pain or fevers.  Continue with Mucinex D as you have been taking. Please start daily flonase as well as daily cetirizine. If symptoms worsen or do not improve in the next week to return to be seen or to follow up with your PCP.     ED Prescriptions    Medication Sig Dispense Auth. Provider   fluticasone (FLONASE) 50 MCG/ACT nasal spray Place 1 spray into both nostrils daily. 16 g Zigmund Gottron, NP  cetirizine (ZYRTEC) 10 MG tablet Take 1 tablet (10 mg total) by mouth daily. 30 tablet Zigmund Gottron, NP     Controlled Substance Prescriptions [place] Controlled Substance  Registry consulted? Not Applicable   Zigmund Gottron, NP 12/21/17 1057

## 2017-12-21 NOTE — Discharge Instructions (Signed)
Push fluids to ensure adequate hydration and keep secretions thin.  Tylenol and/or ibuprofen as needed for pain or fevers.  Continue with Mucinex D as you have been taking. Please start daily flonase as well as daily cetirizine. If symptoms worsen or do not improve in the next week to return to be seen or to follow up with your PCP.

## 2017-12-21 NOTE — ED Triage Notes (Signed)
Pt presents with complaints of sinus pressure and congestion x 2 days.

## 2017-12-22 ENCOUNTER — Ambulatory Visit: Payer: Medicare Other | Admitting: Physical Therapy

## 2017-12-22 ENCOUNTER — Encounter: Payer: Self-pay | Admitting: Physical Therapy

## 2017-12-22 DIAGNOSIS — G8929 Other chronic pain: Secondary | ICD-10-CM | POA: Diagnosis not present

## 2017-12-22 DIAGNOSIS — M6281 Muscle weakness (generalized): Secondary | ICD-10-CM | POA: Diagnosis not present

## 2017-12-22 DIAGNOSIS — R262 Difficulty in walking, not elsewhere classified: Secondary | ICD-10-CM | POA: Diagnosis not present

## 2017-12-22 DIAGNOSIS — M25562 Pain in left knee: Secondary | ICD-10-CM

## 2017-12-22 DIAGNOSIS — M25572 Pain in left ankle and joints of left foot: Secondary | ICD-10-CM

## 2017-12-22 NOTE — Therapy (Signed)
[facility] [place], [place], [address] Phone: [phone]   Fax:  [phone]  Physical Therapy Treatment  Patient Details  Name: Heidi [name] MRN: [id number] Date of Birth: 08/31/1950 Referring Provider: Meredith Pel    Encounter Date: 12/22/2017  PT End of Session - 12/22/17 1100    Visit Number  2    Number of Visits  13    Date for PT Re-Evaluation  01/10/18    Authorization Type  Medicare/Medicaid (PN visit 82, KX visit 90)    Authorization Time Period  12/13/17 to 01/24/18    PT Start Time  1100    PT Stop Time  1140    PT Time Calculation (min)  40 min       Past Medical History:  Diagnosis Date  . Adenomatous colon polyp   . Anemia   . Anxiety   . Asthma   . CHF (congestive heart failure) ([place])   . Diabetes mellitus without complication ([place])   . High cholesterol   . Hypertension   . Left knee injury   . Pneumonia     Past Surgical History:  Procedure Laterality Date  . ABDOMINAL HYSTERECTOMY    . CATARACT EXTRACTION Left   . CATARACT EXTRACTION     LT 01/2017, 02/2017 on RT  . CESAREAN SECTION      There were no vitals filed for this visit.  Subjective Assessment - 12/22/17 1109    Subjective  I have pain. I use a sports cream that helps.     Currently in Pain?  Yes    Pain Score  7     Pain Location  Knee    Pain Orientation  Left;Posterior;Lateral;Anterior    Pain Descriptors / Indicators  Burning    Aggravating Factors   walking prolonged , stairs     Pain Relieving Factors  rest, sports cream     Pain Score  5    Pain Location  Ankle    Pain Orientation  Left    Pain Descriptors / Indicators  Burning hurting    Aggravating Factors   prolonged walking, stairs     Pain Relieving Factors  rest , sports cream                        [facility] Adult PT Treatment/Exercise - 12/22/17 0001      Knee/Hip Exercises: Stretches   Active Hamstring Stretch  3 reps;30  seconds supine with towel    Gastroc Stretch  3 reps;30 seconds seated with towel      Knee/Hip Exercises: Seated   Long Arc Quad  15 reps red band     Other Seated Knee/Hip Exercises  seated ankle circles x10 L     Hamstring Curl  15 reps red band       Knee/Hip Exercises: Supine   Heel Slides  10 reps;Right;Left    Straight Leg Raises  10 reps;Left;Right    Other Supine Knee/Hip Exercises  green band clams supine x 20       Ankle Exercises: Seated   Ankle Circles/Pumps  10 reps    Towel Crunch  5 reps 4 sets    Towel Inversion/Eversion  5 reps 2 sets , cues for form    Heel Raises  20 reps    Toe Raise  20 reps             PT Education - 12/22/17  1136    Education Details  HEP    Person(s) Educated  Patient    Methods  Explanation;Handout    Comprehension  Verbalized understanding       PT Short Term Goals - 12/13/17 1242      PT SHORT TERM GOAL #1   Title  Patient to be compliant with appropriate HEP, to be updated PRN     Time  1    Period  Weeks    Status  New    Target Date  12/20/17      PT SHORT TERM GOAL #2   Title  Patient to improve L ankle dorsiflexion to at least 10 degrees without pain in order to improve gait mechanics and balance     Time  3    Period  Weeks    Status  New    Target Date  01/03/18      PT SHORT TERM GOAL #3   Title  Patient to improve L ankle inversion and eversion by 10 degrees without pain in order to improve gait mechanics and balance     Time  3    Period  Weeks    Status  New        PT Long Term Goals - 12/13/17 1244      PT LONG TERM GOAL #1   Title  Patient to improve functional strength by 1 MMT grade on all tested planes in order to reduce pain and improve tolerance to activity     Time  6    Period  Weeks    Status  New    Target Date  01/24/18      PT LONG TERM GOAL #2   Title  Patient to report pain as being no more than 2/10 L ankle and knee in order to improve QOL and activity tolerance     Time  6     Period  Weeks    Status  New      PT LONG TERM GOAL #3   Title  Patient to be able to ambulate at least 1047ft without assisstive device without pain and with no loss of balance in order to show improved gait and balance skills     Time  6    Period  Weeks    Status  New      PT LONG TERM GOAL #4   Title  Patient to demonstrate improved ankle stability and balance as demonstrated by ability to maintain SLS for at least 30 seconds on solid surface     Time  6    Period  Weeks    Status  New            Plan - 12/22/17 1137    Clinical Impression Statement  Pt able to return demonstrate HEP with good form. Progressed strength and mobility exercises for knee and ankle. Pt tolerates all exercises well without c/o increased pain.     PT Next Visit Plan  review HEP and goals, expand HEP if she has been compliant iwth good form. L anlke mobility, otherwise general strength and balance program     PT Home Exercise Plan  Eval: red TB LAQs and hamstring curls, ankle circles L ,  ankle towel inversion/eversion, towel DF stretch, toe scrunches, SLR, supine clam green band, hamstring stretch supine with towel,     Consulted and Agree with Plan of Care  Patient       Patient will benefit  from skilled therapeutic intervention in order to improve the following deficits and impairments:  Abnormal gait, Improper body mechanics, Pain, Decreased coordination, Decreased mobility, Increased muscle spasms, Postural dysfunction, Decreased activity tolerance, Decreased range of motion, Decreased strength, Hypomobility, Decreased balance, Difficulty walking, Impaired flexibility  Visit Diagnosis: Pain in left ankle and joints of left foot  Chronic pain of left knee  Muscle weakness (generalized)  Difficulty in walking, not elsewhere classified     Problem List Patient Active Problem List   Diagnosis Date Noted  . Morbid obesity due to excess calories ([place]) complicated by hbp/ dm /  hyperlipidemia 09/10/2017  . Cough variant asthma vs UACS/vcd on ACEi  09/09/2017  . Anxiety and depression 09/06/2017  . Mild persistent asthma without complication 16/04/9603  . Paranoia ([place]) 09/06/2017  . Torn left ear lobe 11/20/2016  . Chronic migraine w/o aura w/o status migrainosus, not intractable 06/27/2016  . Post-menopausal atrophic vaginitis 11/14/2015  . Primary osteoarthritis of both knees 07/18/2015  . De Quervain's tenosynovitis, right 05/08/2015  . GERD (gastroesophageal reflux disease) 05/08/2015  . Renal cyst, right 05/08/2015  . Adenomatous polyp of colon 03/26/2015  . Essential hypertension 02/12/2015  . Cognitive deficit due to old head trauma 02/12/2015  . Asthma, chronic 02/12/2015  . Chronic left shoulder pain 02/11/2015  . Thyroid nodule 12/18/2014  . Chronic neck pain   . HLD (hyperlipidemia)   . OSA treated with BiPAP 11/27/2014  . Obesity hypoventilation syndrome ([place]) 11/27/2014  . Demand ischemia ([place])   . Diabetes type 2, uncontrolled ([place])   . Cardiomegaly 11/25/2014  . CHF (congestive heart failure) ([place]) 11/25/2014  . DM type 2 causing CKD stage 2 ([place]) 11/25/2014    Dorene Ar, PTA 12/22/2017, 11:41 AM  [facility] [address] [place], [place], [address] Phone: [phone]   Fax:  [phone]  Name: Heidi [name] MRN: [id number] Date of Birth: 06-18-1951

## 2017-12-24 DIAGNOSIS — M5030 Other cervical disc degeneration, unspecified cervical region: Secondary | ICD-10-CM | POA: Diagnosis not present

## 2017-12-24 DIAGNOSIS — M546 Pain in thoracic spine: Secondary | ICD-10-CM | POA: Diagnosis not present

## 2017-12-27 ENCOUNTER — Encounter: Payer: Self-pay | Admitting: Physical Therapy

## 2017-12-27 ENCOUNTER — Ambulatory Visit: Payer: Medicare Other | Admitting: Physical Therapy

## 2017-12-27 DIAGNOSIS — R262 Difficulty in walking, not elsewhere classified: Secondary | ICD-10-CM

## 2017-12-27 DIAGNOSIS — M25572 Pain in left ankle and joints of left foot: Secondary | ICD-10-CM | POA: Diagnosis not present

## 2017-12-27 DIAGNOSIS — M6281 Muscle weakness (generalized): Secondary | ICD-10-CM

## 2017-12-27 DIAGNOSIS — M25562 Pain in left knee: Secondary | ICD-10-CM

## 2017-12-27 DIAGNOSIS — G8929 Other chronic pain: Secondary | ICD-10-CM

## 2017-12-27 NOTE — Therapy (Signed)
[place], [place], [address] Phone: [phone]   Fax:  [phone]  Physical Therapy Treatment  Patient Details  Name: Heidi [name] MRN: [id number] Date of Birth: 1951/05/12 Referring Provider: Meredith Pel    Encounter Date: 12/27/2017  PT End of Session - 12/27/17 0929    Visit Number  3    Number of Visits  13    Date for PT Re-Evaluation  01/10/18    Authorization - Visit Number  2    Authorization - Number of Visits  10    PT Start Time  0806    PT Stop Time  0845    PT Time Calculation (min)  39 min    Activity Tolerance  Patient tolerated treatment well    Behavior During Therapy  [facility] for tasks assessed/performed       Past Medical History:  Diagnosis Date  . Adenomatous colon polyp   . Anemia   . Anxiety   . Asthma   . CHF (congestive heart failure) ([place])   . Diabetes mellitus without complication ([place])   . High cholesterol   . Hypertension   . Left knee injury   . Pneumonia     Past Surgical History:  Procedure Laterality Date  . ABDOMINAL HYSTERECTOMY    . CATARACT EXTRACTION Left   . CATARACT EXTRACTION     LT 01/2017, 02/2017 on RT  . CESAREAN SECTION      There were no vitals filed for this visit.  Subjective Assessment - 12/27/17 0811    Subjective  Injection helped. has been doing her exercises.    Currently in Pain?  No/denies    Pain Location  Knee    Pain Orientation  Left;Posterior;Lateral    Pain Type  Chronic pain    Pain Frequency  Intermittent    Pain Relieving Factors  brace,  cream,  sports rub.applied to a rag and placed under brace.     Pain Location  Ankle    Pain Orientation  Left    Pain Descriptors / Indicators  Burning painful    Pain Type  Chronic pain    Aggravating Factors   anamating a lot.    Pain Relieving Factors  pain pill,  sports cream                       [facility] Adult PT Treatment/Exercise - 12/27/17 0001       Knee/Hip Exercises: Seated   Long Arc Quad  10 reps    Hamstring Curl  10 reps red band      Knee/Hip Exercises: Supine   Quad Sets  10 reps in combo with gluteal sets  cued technique    Bridges  10 reps cued initially    Straight Leg Raises  10 reps AA first 3,  added 4 way to HEp,  not all practiced    Patellar Mobs  checked      Knee/Hip Exercises: Sidelying   Clams  10  left  cued min assist initially      Ankle Exercises: Seated   Heel Raises  20 reps small range    Toe Raise  10 reps small range    Heel Slides  10 reps pillow case  ROM limited             PT Education - 12/27/17 0845    Education Details  HEP    Person(s) Educated  Patient    Methods  Explanation;Demonstration;Tactile cues;Verbal cues;Handout    Comprehension  Verbalized understanding;Returned demonstration;Need further instruction       PT Short Term Goals - 12/13/17 1242      PT SHORT TERM GOAL #1   Title  Patient to be compliant with appropriate HEP, to be updated PRN     Time  1    Period  Weeks    Status  New    Target Date  12/20/17      PT SHORT TERM GOAL #2   Title  Patient to improve L ankle dorsiflexion to at least 10 degrees without pain in order to improve gait mechanics and balance     Time  3    Period  Weeks    Status  New    Target Date  01/03/18      PT SHORT TERM GOAL #3   Title  Patient to improve L ankle inversion and eversion by 10 degrees without pain in order to improve gait mechanics and balance     Time  3    Period  Weeks    Status  New        PT Long Term Goals - 12/13/17 1244      PT LONG TERM GOAL #1   Title  Patient to improve functional strength by 1 MMT grade on all tested planes in order to reduce pain and improve tolerance to activity     Time  6    Period  Weeks    Status  New    Target Date  01/24/18      PT LONG TERM GOAL #2   Title  Patient to report pain as being no more than 2/10 L ankle and knee in order to improve QOL and activity  tolerance     Time  6    Period  Weeks    Status  New      PT LONG TERM GOAL #3   Title  Patient to be able to ambulate at least 1083ft without assisstive device without pain and with no loss of balance in order to show improved gait and balance skills     Time  6    Period  Weeks    Status  New      PT LONG TERM GOAL #4   Title  Patient to demonstrate improved ankle stability and balance as demonstrated by ability to maintain SLS for at least 30 seconds on solid surface     Time  6    Period  Weeks    Status  New            Plan - 12/27/17 1335    Clinical Impression Statement  Able to progress HEP to improve hip strenght.  Mild imcreases in pain with exercise. No new objective findings today except endurance improving with exercise.       PT Next Visit Plan  review HEP and goals, expand HEP if she has been compliant iwth good form. L anlke mobility, otherwise general strength and balance program     PT Home Exercise Plan  Eval: red TB LAQs and hamstring curls, ankle circles L ,  ankle towel inversion/eversion, towel DF stretch, toe scrunches, SLR, supine clam green band, hamstring stretch supine with towel, supine 4 way SLR,  bridge.    Consulted and Agree with Plan of Care  Patient       Patient will benefit from skilled therapeutic intervention  in order to improve the following deficits and impairments:     Visit Diagnosis: Pain in left ankle and joints of left foot  Chronic pain of left knee  Muscle weakness (generalized)  Difficulty in walking, not elsewhere classified     Problem List Patient Active Problem List   Diagnosis Date Noted  . Morbid obesity due to excess calories ([place]) complicated by hbp/ dm / hyperlipidemia 09/10/2017  . Cough variant asthma vs UACS/vcd on ACEi  09/09/2017  . Anxiety and depression 09/06/2017  . Mild persistent asthma without complication 63/14/9702  . Paranoia ([place]) 09/06/2017  . Torn left ear lobe 11/20/2016  . Chronic  migraine w/o aura w/o status migrainosus, not intractable 06/27/2016  . Post-menopausal atrophic vaginitis 11/14/2015  . Primary osteoarthritis of both knees 07/18/2015  . De Quervain's tenosynovitis, right 05/08/2015  . GERD (gastroesophageal reflux disease) 05/08/2015  . Renal cyst, right 05/08/2015  . Adenomatous polyp of colon 03/26/2015  . Essential hypertension 02/12/2015  . Cognitive deficit due to old head trauma 02/12/2015  . Asthma, chronic 02/12/2015  . Chronic left shoulder pain 02/11/2015  . Thyroid nodule 12/18/2014  . Chronic neck pain   . HLD (hyperlipidemia)   . OSA treated with BiPAP 11/27/2014  . Obesity hypoventilation syndrome ([place]) 11/27/2014  . Demand ischemia ([place])   . Diabetes type 2, uncontrolled ([place])   . Cardiomegaly 11/25/2014  . CHF (congestive heart failure) ([place]) 11/25/2014  . DM type 2 causing CKD stage 2 ([place]) 11/25/2014    Kaylen Motl  PTA 12/27/2017, 1:39 PM  [facility] [address] [place], [place], [address] Phone: [phone]   Fax:  [phone]  Name: Jonette Wassel MRN: [id number] Date of Birth: 1951-02-19

## 2017-12-27 NOTE — Patient Instructions (Addendum)
Bridge    Lie back, legs bent. Inhale, pressing hips up.  Exhale as you lower Repeat 10____ times. Do __1__ sessions per day.  http://pm.exer.us/55   Copyright  VHI. All rights reserved.    Added from exercise drawer:  Hip supine SLR All added daily 10 x each 0 second hold

## 2017-12-29 ENCOUNTER — Other Ambulatory Visit: Payer: Self-pay | Admitting: Internal Medicine

## 2017-12-30 ENCOUNTER — Ambulatory Visit: Payer: Medicare Other | Admitting: Physical Therapy

## 2017-12-30 ENCOUNTER — Encounter: Payer: Self-pay | Admitting: Physical Therapy

## 2017-12-30 DIAGNOSIS — M6281 Muscle weakness (generalized): Secondary | ICD-10-CM | POA: Diagnosis not present

## 2017-12-30 DIAGNOSIS — R262 Difficulty in walking, not elsewhere classified: Secondary | ICD-10-CM | POA: Diagnosis not present

## 2017-12-30 DIAGNOSIS — G8929 Other chronic pain: Secondary | ICD-10-CM | POA: Diagnosis not present

## 2017-12-30 DIAGNOSIS — M25572 Pain in left ankle and joints of left foot: Secondary | ICD-10-CM

## 2017-12-30 DIAGNOSIS — M25562 Pain in left knee: Secondary | ICD-10-CM | POA: Diagnosis not present

## 2017-12-30 NOTE — Therapy (Signed)
[place], [place], [address] Phone: [phone]   Fax:  [phone]  Physical Therapy Treatment  Patient Details  Name: Heidi [name] MRN: [id number] Date of Birth: 09/05/1950 Referring Provider: Meredith Pel    Encounter Date: 12/30/2017  PT End of Session - 12/30/17 1414    Visit Number  4    Number of Visits  13    Date for PT Re-Evaluation  01/10/18    Authorization Type  Medicare/Medicaid (PN visit 42, KX visit 27)    Authorization Time Period  12/13/17 to 01/24/18    Authorization - Visit Number  4    Authorization - Number of Visits  10    PT Start Time  6144    PT Stop Time  1413    PT Time Calculation (min)  39 min    Activity Tolerance  Patient tolerated treatment well    Behavior During Therapy  [facility] for tasks assessed/performed       Past Medical History:  Diagnosis Date  . Adenomatous colon polyp   . Anemia   . Anxiety   . Asthma   . CHF (congestive heart failure) ([place])   . Diabetes mellitus without complication ([place])   . High cholesterol   . Hypertension   . Left knee injury   . Pneumonia     Past Surgical History:  Procedure Laterality Date  . ABDOMINAL HYSTERECTOMY    . CATARACT EXTRACTION Left   . CATARACT EXTRACTION     LT 01/2017, 02/2017 on RT  . CESAREAN SECTION      There were no vitals filed for this visit.  Subjective Assessment - 12/30/17 1336    Subjective  I didn't wear my tennis shoes today as I'm washing them. Someone readjusted my cane and I don't know who, its too tall. I've been sore after PT sessions. I have some business that keeps me from doing it as much I'd like if I am feeling totally terrible (PT unsure what patient was mentioning despite attempting clarification)     Patient Stated Goals  wear heels again, be able to walk more and better, get stronger     Currently in Pain?  Yes    Pain Score  6     Pain Location  Knee    Pain Orientation   Left;Posterior    Pain Descriptors / Indicators  Burning;Dull    Pain Type  Chronic pain    Pain Radiating Towards  up into thigh and down leg too     Pain Onset  More than a month ago    Pain Frequency  Intermittent    Aggravating Factors   walking and stairs     Pain Relieving Factors  brace, cream, sports rub     Effect of Pain on Daily Activities  moderate                        [facility] Adult PT Treatment/Exercise - 12/30/17 0001      Exercises   Exercises  Ankle      Knee/Hip Exercises: Standing   Heel Raises  Both;1 set;10 reps heel and toe     Knee Flexion  Both;1 set;10 reps      Knee/Hip Exercises: Seated   Long Arc Quad  20 reps yellow TB due to knee pain     Sit to Sand  10 reps;without UE support;Other (comment) cues for controlled lower  back to chair       Knee/Hip Exercises: Supine   Short Arc Quad Sets  Both;1 set;15 reps;Other (comment) 2#     Bridges  Both;1 set;15 reps    Straight Leg Raises  Both;1 set;15 reps      Knee/Hip Exercises: Sidelying   Clams  x10 red TB       Knee/Hip Exercises: Prone   Hamstring Curl  1 set;10 reps;Other (comment) red TB     Hip Extension  Both;1 set;10 reps      Ankle Exercises: Supine   T-Band  4 way R ankle x10 with red TB              PT Education - 12/30/17 1413    Education Details  exercise form, DOMS after exercise, wait to start neck PT until we finish with knee/ankle, correct height of SPC     Person(s) Educated  Patient    Methods  Explanation    Comprehension  Verbalized understanding       PT Short Term Goals - 12/13/17 1242      PT SHORT TERM GOAL #1   Title  Patient to be compliant with appropriate HEP, to be updated PRN     Time  1    Period  Weeks    Status  New    Target Date  12/20/17      PT SHORT TERM GOAL #2   Title  Patient to improve L ankle dorsiflexion to at least 10 degrees without pain in order to improve gait mechanics and balance     Time  3    Period  Weeks     Status  New    Target Date  01/03/18      PT SHORT TERM GOAL #3   Title  Patient to improve L ankle inversion and eversion by 10 degrees without pain in order to improve gait mechanics and balance     Time  3    Period  Weeks    Status  New        PT Long Term Goals - 12/13/17 1244      PT LONG TERM GOAL #1   Title  Patient to improve functional strength by 1 MMT grade on all tested planes in order to reduce pain and improve tolerance to activity     Time  6    Period  Weeks    Status  New    Target Date  01/24/18      PT LONG TERM GOAL #2   Title  Patient to report pain as being no more than 2/10 L ankle and knee in order to improve QOL and activity tolerance     Time  6    Period  Weeks    Status  New      PT LONG TERM GOAL #3   Title  Patient to be able to ambulate at least 10100ft without assisstive device without pain and with no loss of balance in order to show improved gait and balance skills     Time  6    Period  Weeks    Status  New      PT LONG TERM GOAL #4   Title  Patient to demonstrate improved ankle stability and balance as demonstrated by ability to maintain SLS for at least 30 seconds on solid surface     Time  6    Period  Weeks    Status  New            Plan - 12/30/17 1414    Clinical Impression Statement  Continued with general functional strengthening program for B LEs including ankles today. Patient remains motivated to continue with skilled PT services to address pain moving forward. She required cues throughout today's session for correct form; unsure of compliance with PT advice as patient arrived in flip flops/unsupportive foot wear and with cane re-adjusted to inappropriate height. Re-adjusted cane this session and educated regarding importance of correct height for assistive device.     Rehab Potential  Fair    PT Frequency  2x / week    PT Duration  6 weeks    PT Treatment/Interventions  ADLs/Self Care Home  Management;Biofeedback;Cryotherapy;Electrical Stimulation;Iontophoresis 4mg /ml Dexamethasone;Moist Heat;Ultrasound;DME Instruction;Gait training;Stair training;Functional mobility training;Therapeutic activities;Therapeutic exercise;Balance training;Neuromuscular re-education;Patient/family education;Manual techniques;Scar mobilization;Passive range of motion;Dry needling;Taping    PT Next Visit Plan  continue gross strengthening program, increase focus on ankle    PT Home Exercise Plan  Eval: red TB LAQs and hamstring curls, ankle circles L ,  ankle towel inversion/eversion, towel DF stretch, toe scrunches, SLR, supine clam green band, hamstring stretch supine with towel, supine 4 way SLR,  bridge.    Consulted and Agree with Plan of Care  Patient       Patient will benefit from skilled therapeutic intervention in order to improve the following deficits and impairments:  Abnormal gait, Improper body mechanics, Pain, Decreased coordination, Decreased mobility, Increased muscle spasms, Postural dysfunction, Decreased activity tolerance, Decreased range of motion, Decreased strength, Hypomobility, Decreased balance, Difficulty walking, Impaired flexibility  Visit Diagnosis: Pain in left ankle and joints of left foot  Chronic pain of left knee  Muscle weakness (generalized)  Difficulty in walking, not elsewhere classified     Problem List Patient Active Problem List   Diagnosis Date Noted  . Morbid obesity due to excess calories ([place]) complicated by hbp/ dm / hyperlipidemia 09/10/2017  . Cough variant asthma vs UACS/vcd on ACEi  09/09/2017  . Anxiety and depression 09/06/2017  . Mild persistent asthma without complication 81/77/1165  . Paranoia ([place]) 09/06/2017  . Torn left ear lobe 11/20/2016  . Chronic migraine w/o aura w/o status migrainosus, not intractable 06/27/2016  . Post-menopausal atrophic vaginitis 11/14/2015  . Primary osteoarthritis of both knees 07/18/2015  . De Quervain's  tenosynovitis, right 05/08/2015  . GERD (gastroesophageal reflux disease) 05/08/2015  . Renal cyst, right 05/08/2015  . Adenomatous polyp of colon 03/26/2015  . Essential hypertension 02/12/2015  . Cognitive deficit due to old head trauma 02/12/2015  . Asthma, chronic 02/12/2015  . Chronic left shoulder pain 02/11/2015  . Thyroid nodule 12/18/2014  . Chronic neck pain   . HLD (hyperlipidemia)   . OSA treated with BiPAP 11/27/2014  . Obesity hypoventilation syndrome ([place]) 11/27/2014  . Demand ischemia ([place])   . Diabetes type 2, uncontrolled ([place])   . Cardiomegaly 11/25/2014  . CHF (congestive heart failure) ([place]) 11/25/2014  . DM type 2 causing CKD stage 2 ([place]) 11/25/2014    Deniece Ree PT, DPT, CBIS  Supplemental Physical Therapist [place]   Pager [place] [facility] [address] [place], [place], [address] Phone: [phone]   Fax:  [phone]  Name: Heidi [name] MRN: [id number] Date of Birth: [birth year]

## 2018-01-03 ENCOUNTER — Ambulatory Visit: Payer: Medicare Other | Admitting: Physical Therapy

## 2018-01-04 ENCOUNTER — Telehealth: Payer: Self-pay | Admitting: *Deleted

## 2018-01-04 MED ORDER — ACETAMINOPHEN-CODEINE 300-30 MG PO TABS: 1.0000 | ORAL_TABLET | Freq: Two times a day (BID) | ORAL | 0 refills | Status: DC

## 2018-01-04 NOTE — Telephone Encounter (Signed)
Heidi [name] called and needs a refill on her Tylenol #3. Last fill was 11/09/17 per PMP. Her appt is 01/18/18 with Dr Letta Pate.  She would like to discuss changing medication. I will call in one fill and she can discuss changes at her appt. [name] notified.

## 2018-01-05 ENCOUNTER — Ambulatory Visit: Payer: Medicare Other | Attending: Orthopedic Surgery | Admitting: Physical Therapy

## 2018-01-05 ENCOUNTER — Telehealth: Payer: Self-pay | Admitting: Physical Therapy

## 2018-01-05 DIAGNOSIS — G8929 Other chronic pain: Secondary | ICD-10-CM | POA: Insufficient documentation

## 2018-01-05 DIAGNOSIS — M6281 Muscle weakness (generalized): Secondary | ICD-10-CM | POA: Insufficient documentation

## 2018-01-05 DIAGNOSIS — R262 Difficulty in walking, not elsewhere classified: Secondary | ICD-10-CM | POA: Insufficient documentation

## 2018-01-05 DIAGNOSIS — M25562 Pain in left knee: Secondary | ICD-10-CM | POA: Insufficient documentation

## 2018-01-05 DIAGNOSIS — M25572 Pain in left ankle and joints of left foot: Secondary | ICD-10-CM | POA: Insufficient documentation

## 2018-01-05 NOTE — Telephone Encounter (Signed)
Patient was called about missed visit.  She forgot her appointment.  She was reminded of the next appointment date and time.  She said she would attend. Melvenia Needles PTA

## 2018-01-10 ENCOUNTER — Encounter: Payer: Self-pay | Admitting: Physical Therapy

## 2018-01-10 ENCOUNTER — Ambulatory Visit: Payer: Medicare Other | Admitting: Physical Therapy

## 2018-01-10 DIAGNOSIS — M25572 Pain in left ankle and joints of left foot: Secondary | ICD-10-CM | POA: Diagnosis not present

## 2018-01-10 DIAGNOSIS — G8929 Other chronic pain: Secondary | ICD-10-CM | POA: Diagnosis not present

## 2018-01-10 DIAGNOSIS — R262 Difficulty in walking, not elsewhere classified: Secondary | ICD-10-CM | POA: Diagnosis not present

## 2018-01-10 DIAGNOSIS — M6281 Muscle weakness (generalized): Secondary | ICD-10-CM

## 2018-01-10 DIAGNOSIS — M25562 Pain in left knee: Secondary | ICD-10-CM | POA: Diagnosis not present

## 2018-01-10 NOTE — Therapy (Signed)
[place], [place], [address] Phone: [phone]   Fax:  [phone]  Physical Therapy Treatment  Patient Details  Name: Heidi [name] MRN: [id number] Date of Birth: December 16, 1950 Referring Provider: Meredith Pel    Encounter Date: 01/10/2018  PT End of Session - 01/10/18 0843    Visit Number  5    Number of Visits  13    Date for PT Re-Evaluation  01/10/18    Authorization - Visit Number  5    Authorization - Number of Visits  10    PT Start Time  0801    PT Stop Time  0843    PT Time Calculation (min)  42 min    Activity Tolerance  Patient tolerated treatment well    Behavior During Therapy  [facility] for tasks assessed/performed       Past Medical History:  Diagnosis Date  . Adenomatous colon polyp   . Anemia   . Anxiety   . Asthma   . CHF (congestive heart failure) ([place])   . Diabetes mellitus without complication ([place])   . High cholesterol   . Hypertension   . Left knee injury   . Pneumonia     Past Surgical History:  Procedure Laterality Date  . ABDOMINAL HYSTERECTOMY    . CATARACT EXTRACTION Left   . CATARACT EXTRACTION     LT 01/2017, 02/2017 on RT  . CESAREAN SECTION      There were no vitals filed for this visit.  Subjective Assessment - 01/10/18 0805    Subjective  Wearing tennis shoes today.  No pain in feet now,  Shoes are supportive.    Currently in Pain?  Yes    Pain Location  Knee    Pain Orientation  Anterior;Posterior;Left    Pain Descriptors / Indicators  Dull;Burning    Pain Type  Chronic pain    Pain Frequency  Intermittent    Aggravating Factors   walking    Pain Relieving Factors  brace,  cane    Pain Score  0    Pain Location  Ankle    Pain Orientation  Left    Pain Type  Chronic pain    Aggravating Factors   walking a lot    Pain Relieving Factors  rest                       [facility] Adult PT Treatment/Exercise - 01/10/18 0001      Knee/Hip  Exercises: Aerobic   Nustep  L5 X 8 minutes      Knee/Hip Exercises: Standing   Heel Raises  Both;1 set;10 reps heel and toe     Forward Step Up  Both;1 set;10 reps;Hand Hold: 2;Step Height: 4"    SLS  left 15 seconds      Knee/Hip Exercises: Seated   Hamstring Curl  10 reps red band    Sit to Sand  -- 7 reps,  cued to press heels      Knee/Hip Exercises: Supine   Short Arc Quad Sets  15 reps 3 aLBS increased pain so stopped    Bridges  Both;1 set;15 reps    Straight Leg Raises  Both;1 set;15 reps cued      Ankle Exercises: Supine   T-Band  4 way R ankle x10 with red TB              PT Education - 01/10/18 2993  Education Details  Exercise form    Person(s) Educated  Patient    Methods  Explanation;Demonstration;Tactile cues;Verbal cues;Handout    Comprehension  Verbalized understanding       PT Short Term Goals - 01/10/18 0845      PT SHORT TERM GOAL #1   Title  Patient to be compliant with appropriate HEP, to be updated PRN     Baseline  does not get then done always    Period  Weeks    Status  On-going      PT SHORT TERM GOAL #2   Title  Patient to improve L ankle dorsiflexion to at least 10 degrees without pain in order to improve gait mechanics and balance     Baseline  less than 10 degrees in standing    Time  3    Period  Weeks    Status  On-going      PT SHORT TERM GOAL #3   Title  Patient to improve L ankle inversion and eversion by 10 degrees without pain in order to improve gait mechanics and balance     Time  3    Period  Weeks    Status  Unable to assess      PT SHORT TERM GOAL #4   Title  She will demo understanding of good sitting posture with support    Time  3    Period  Weeks    Status  Unable to assess        PT Long Term Goals - 12/13/17 1244      PT LONG TERM GOAL #1   Title  Patient to improve functional strength by 1 MMT grade on all tested planes in order to reduce pain and improve tolerance to activity     Time  6     Period  Weeks    Status  New    Target Date  01/24/18      PT LONG TERM GOAL #2   Title  Patient to report pain as being no more than 2/10 L ankle and knee in order to improve QOL and activity tolerance     Time  6    Period  Weeks    Status  New      PT LONG TERM GOAL #3   Title  Patient to be able to ambulate at least 1096f without assisstive device without pain and with no loss of balance in order to show improved gait and balance skills     Time  6    Period  Weeks    Status  New      PT LONG TERM GOAL #4   Title  Patient to demonstrate improved ankle stability and balance as demonstrated by ability to maintain SLS for at least 30 seconds on solid surface     Time  6    Period  Weeks    Status  New            Plan - 01/10/18 0844    Clinical Impression Statement  SLS left 15 seconds.  Patient able to progress to colsed chain exercises with 4 inch step up and no increased pain.  She is inconsistant with HEP.  No new goals met..  No increased pain noted at end of session.  She declined the need for modalities.    PT Next Visit Plan  continue gross strengthening program, increase focus on ankle    PT Home Exercise Plan  Eval: red TB LAQs and hamstring curls, ankle circles L ,  ankle towel inversion/eversion, towel DF stretch, toe scrunches, SLR, supine clam green band, hamstring stretch supine with towel, supine 4 way SLR,  bridge.    Consulted and Agree with Plan of Care  Patient       Patient will benefit from skilled therapeutic intervention in order to improve the following deficits and impairments:     Visit Diagnosis: Pain in left ankle and joints of left foot  Chronic pain of left knee  Muscle weakness (generalized)  Difficulty in walking, not elsewhere classified     Problem List Patient Active Problem List   Diagnosis Date Noted  . Morbid obesity due to excess calories ([place]) complicated by hbp/ dm / hyperlipidemia 09/10/2017  . Cough variant asthma vs  UACS/vcd on ACEi  09/09/2017  . Anxiety and depression 09/06/2017  . Mild persistent asthma without complication 09/40/7680  . Paranoia ([place]) 09/06/2017  . Torn left ear lobe 11/20/2016  . Chronic migraine w/o aura w/o status migrainosus, not intractable 06/27/2016  . Post-menopausal atrophic vaginitis 11/14/2015  . Primary osteoarthritis of both knees 07/18/2015  . De Quervain's tenosynovitis, right 05/08/2015  . GERD (gastroesophageal reflux disease) 05/08/2015  . Renal cyst, right 05/08/2015  . Adenomatous polyp of colon 03/26/2015  . Essential hypertension 02/12/2015  . Cognitive deficit due to old head trauma 02/12/2015  . Asthma, chronic 02/12/2015  . Chronic left shoulder pain 02/11/2015  . Thyroid nodule 12/18/2014  . Chronic neck pain   . HLD (hyperlipidemia)   . OSA treated with BiPAP 11/27/2014  . Obesity hypoventilation syndrome ([place]) 11/27/2014  . Demand ischemia ([place])   . Diabetes type 2, uncontrolled ([place])   . Cardiomegaly 11/25/2014  . CHF (congestive heart failure) ([place]) 11/25/2014  . DM type 2 causing CKD stage 2 ([place]) 11/25/2014    HARRIS,KAREN PTA 01/10/2018, 8:46 AM  [facility] [address] [place], [place], [address] Phone: [phone]   Fax:  [phone]  Name: Beulah Capobianco MRN: [id number] Date of Birth: Aug 14, 1950

## 2018-01-13 ENCOUNTER — Ambulatory Visit: Payer: Medicare Other | Admitting: Physical Therapy

## 2018-01-13 ENCOUNTER — Encounter: Payer: Self-pay | Admitting: Physical Therapy

## 2018-01-13 DIAGNOSIS — M6281 Muscle weakness (generalized): Secondary | ICD-10-CM

## 2018-01-13 DIAGNOSIS — G8929 Other chronic pain: Secondary | ICD-10-CM

## 2018-01-13 DIAGNOSIS — M25572 Pain in left ankle and joints of left foot: Secondary | ICD-10-CM | POA: Diagnosis not present

## 2018-01-13 DIAGNOSIS — R262 Difficulty in walking, not elsewhere classified: Secondary | ICD-10-CM

## 2018-01-13 DIAGNOSIS — M25562 Pain in left knee: Secondary | ICD-10-CM

## 2018-01-13 NOTE — Therapy (Signed)
[place], [place], [address] Phone: [phone]   Fax:  [phone]  Physical Therapy Treatment (ERO/Recert)  Patient Details  Name: Heidi [name] MRN: [id number] Date of Birth: Mar 19, 1951 Referring Provider: Meredith Pel    Encounter Date: 01/13/2018  PT End of Session - 01/13/18 1018    Visit Number  6    Number of Visits  13    Date for PT Re-Evaluation  02/07/18    Authorization Type  Medicare/Medicaid (PN visit 1, [place] visit 26)    Authorization Time Period  0/93/26 to 01/15/44; recert done 02/11/97    Authorization - Visit Number  6    Authorization - Number of Visits  10    PT Start Time  0810    PT Stop Time  0843    PT Time Calculation (min)  33 min    Activity Tolerance  Patient tolerated treatment well    Behavior During Therapy  [facility] for tasks assessed/performed       Past Medical History:  Diagnosis Date  . Adenomatous colon polyp   . Anemia   . Anxiety   . Asthma   . CHF (congestive heart failure) ([place])   . Diabetes mellitus without complication ([place])   . High cholesterol   . Hypertension   . Left knee injury   . Pneumonia     Past Surgical History:  Procedure Laterality Date  . ABDOMINAL HYSTERECTOMY    . CATARACT EXTRACTION Left   . CATARACT EXTRACTION     LT 01/2017, 02/2017 on RT  . CESAREAN SECTION      There were no vitals filed for this visit.  Subjective Assessment - 01/13/18 0813    Subjective  Alll the exercise is creating some pain in the upper back. I think this comes from working of legs. My knee and ankle are doing OK (only states OK when asked if they are feeling better). Stairs are hard, walking a long way is hard.     How long can you sit comfortably?  7/11- unlimited     How long can you stand comfortably?  7/11- unsure, "long period of time"     How long can you walk comfortably?  7/11- 152ft     Patient Stated Goals  wear heels again, be able to walk  more and better, get stronger     Currently in Pain?  Yes    Pain Score  2     Pain Location  Other (Comment) knee and ankle    Pain Orientation  Left    Pain Descriptors / Indicators  Burning;Dull    Pain Type  Chronic pain    Pain Radiating Towards  none     Pain Onset  More than a month ago    Pain Frequency  Intermittent    Aggravating Factors   walking, standing too long     Pain Relieving Factors  medicines, laying down/sitting to rest with LEs up, brace          [facility] PT Assessment - 01/13/18 0001      Assessment   Medical Diagnosis  L knee and ankle pain     Referring Provider  Meredith Pel     Onset Date/Surgical Date  -- February 2019     Next MD Visit  Dr. Marlou Sa in August     Prior Therapy  none for this condition       Precautions  Precautions  None      Restrictions   Weight Bearing Restrictions  No      Balance Screen   Has the patient fallen in the past 6 months  No    Has the patient had a decrease in activity level because of a fear of falling?   Yes    Is the patient reluctant to leave their home because of a fear of falling?   No      Prior Function   Level of Independence  Independent;Independent with basic ADLs;Requires assistive device for independence;Independent with transfers;Independent with gait    Vocation  Retired    Tax adviser, going on trips, etc       AROM   Left Knee Extension  -4    Left Knee Flexion  124    Left Ankle Dorsiflexion  8    Left Ankle Plantar Flexion  60    Left Ankle Inversion  15    Left Ankle Eversion  5      Strength   Right Hip Flexion  3+/5    Right Hip Extension  3/5    Right Hip ABduction  3+/5    Left Hip Flexion  3+/5    Left Hip Extension  4/5    Left Hip ABduction  4+/5    Right Knee Flexion  4+/5    Right Knee Extension  4+/5    Left Knee Flexion  4/5 pain limited     Left Knee Extension  4+/5    Right Ankle Dorsiflexion  5/5    Right Ankle Inversion  5/5    Right Ankle  Eversion  5/5    Left Ankle Dorsiflexion  5/5    Left Ankle Inversion  5/5    Left Ankle Eversion  5/5                   [facility] Adult PT Treatment/Exercise - 01/13/18 0001      Knee/Hip Exercises: Supine   Bridges  Both;1 set;20 reps    Other Supine Knee/Hip Exercises  supine clams 1x20 red TB              PT Education - 01/13/18 1017    Education Details  progress with goals, POC moving forward     Person(s) Educated  Patient    Methods  Explanation    Comprehension  Verbalized understanding       PT Short Term Goals - 01/13/18 0830      PT SHORT TERM GOAL #1   Title  Patient to be compliant with appropriate HEP, to be updated PRN     Baseline  7/11- mixed compliance     Time  1    Period  Weeks    Status  On-going      PT SHORT TERM GOAL #2   Title  Patient to improve L ankle dorsiflexion to at least 10 degrees without pain in order to improve gait mechanics and balance     Baseline  7/11- 9 degrees supine     Time  3    Period  Weeks    Status  On-going      PT SHORT TERM GOAL #3   Title  Patient to improve L ankle inversion and eversion by 10 degrees without pain in order to improve gait mechanics and balance     Baseline  7/11- improving (flowsheets)    Time  3    Period  Weeks    Status  On-going        PT Long Term Goals - 01/13/18 0832      PT LONG TERM GOAL #1   Title  Patient to improve functional strength by 1 MMT grade on all tested planes in order to reduce pain and improve tolerance to activity     Baseline  7/11- flowsheets     Time  6    Period  Weeks    Status  On-going      PT LONG TERM GOAL #2   Title  Patient to report pain as being no more than 2/10 L ankle and knee in order to improve QOL and activity tolerance     Baseline  7/11- 2/10 this morning, at worst 9/10     Time  6    Period  Weeks    Status  On-going      PT LONG TERM GOAL #3   Title  Patient to be able to ambulate at least 1064ft without assisstive  device without pain and with no loss of balance in order to show improved gait and balance skills     Baseline  7/11- 142ft     Time  6    Period  Weeks    Status  On-going      PT LONG TERM GOAL #4   Title  Patient to demonstrate improved ankle stability and balance as demonstrated by ability to maintain SLS for at least 30 seconds on solid surface     Baseline  7/11- DNT     Time  6    Period  Weeks    Status  On-going            Plan - 01/13/18 1018    Clinical Impression Statement  Re-assessment performed today. Patient arrives late for this session but is very pleasant and cooperative this morning, reports she is feeling better but is still having pain. Examination reveals some improvement in ROM and strength, and FOTO score is significantly higher as compared to initial evaluation. She will likely benefit from continuation of skilled PT services to continue addressing functional deficits and to assist in reducing pain moving forward.     Rehab Potential  Fair    PT Frequency  2x / week    PT Duration  4 weeks    PT Treatment/Interventions  ADLs/Self Care Home Management;Biofeedback;Cryotherapy;Electrical Stimulation;Iontophoresis 4mg /ml Dexamethasone;Moist Heat;Ultrasound;DME Instruction;Gait training;Stair training;Functional mobility training;Therapeutic activities;Therapeutic exercise;Balance training;Neuromuscular re-education;Patient/family education;Manual techniques;Scar mobilization;Passive range of motion;Dry needling;Taping    PT Next Visit Plan  continue gross strengthening program, increase focus on ankle    PT Home Exercise Plan  Eval: red TB LAQs and hamstring curls, ankle circles L ,  ankle towel inversion/eversion, towel DF stretch, toe scrunches, SLR, supine clam green band, hamstring stretch supine with towel, supine 4 way SLR,  bridge.    Consulted and Agree with Plan of Care  Patient       Patient will benefit from skilled therapeutic intervention in order to  improve the following deficits and impairments:  Abnormal gait, Improper body mechanics, Pain, Decreased coordination, Decreased mobility, Increased muscle spasms, Postural dysfunction, Decreased activity tolerance, Decreased range of motion, Decreased strength, Hypomobility, Decreased balance, Difficulty walking, Impaired flexibility  Visit Diagnosis: Pain in left ankle and joints of left foot - Plan: PT plan of care cert/re-cert  Chronic pain of left knee - Plan: PT plan of care cert/re-cert  Muscle weakness (generalized) - Plan: PT  plan of care cert/re-cert  Difficulty in walking, not elsewhere classified - Plan: PT plan of care cert/re-cert     Problem List Patient Active Problem List   Diagnosis Date Noted  . Morbid obesity due to excess calories ([place]) complicated by hbp/ dm / hyperlipidemia 09/10/2017  . Cough variant asthma vs UACS/vcd on ACEi  09/09/2017  . Anxiety and depression 09/06/2017  . Mild persistent asthma without complication 38/17/7116  . Paranoia ([place]) 09/06/2017  . Torn left ear lobe 11/20/2016  . Chronic migraine w/o aura w/o status migrainosus, not intractable 06/27/2016  . Post-menopausal atrophic vaginitis 11/14/2015  . Primary osteoarthritis of both knees 07/18/2015  . De Quervain's tenosynovitis, right 05/08/2015  . GERD (gastroesophageal reflux disease) 05/08/2015  . Renal cyst, right 05/08/2015  . Adenomatous polyp of colon 03/26/2015  . Essential hypertension 02/12/2015  . Cognitive deficit due to old head trauma 02/12/2015  . Asthma, chronic 02/12/2015  . Chronic left shoulder pain 02/11/2015  . Thyroid nodule 12/18/2014  . Chronic neck pain   . HLD (hyperlipidemia)   . OSA treated with BiPAP 11/27/2014  . Obesity hypoventilation syndrome ([place]) 11/27/2014  . Demand ischemia ([place])   . Diabetes type 2, uncontrolled ([place])   . Cardiomegaly 11/25/2014  . CHF (congestive heart failure) ([place]) 11/25/2014  . 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   Pager [place] [facility] [address] [place], [place], [address] Phone: [phone]   Fax:  [phone]  Name: Heidi [name] MRN: [id number] Date of Birth: 08-22-1950

## 2018-01-17 ENCOUNTER — Ambulatory Visit: Payer: Medicare Other | Admitting: Physical Therapy

## 2018-01-18 ENCOUNTER — Ambulatory Visit: Payer: Self-pay | Admitting: Physical Medicine & Rehabilitation

## 2018-01-18 ENCOUNTER — Encounter: Payer: Medicare Other | Attending: Physical Medicine & Rehabilitation

## 2018-01-18 DIAGNOSIS — Z9889 Other specified postprocedural states: Secondary | ICD-10-CM | POA: Insufficient documentation

## 2018-01-18 DIAGNOSIS — M47896 Other spondylosis, lumbar region: Secondary | ICD-10-CM | POA: Insufficient documentation

## 2018-01-18 DIAGNOSIS — Z9842 Cataract extraction status, left eye: Secondary | ICD-10-CM | POA: Insufficient documentation

## 2018-01-18 DIAGNOSIS — G894 Chronic pain syndrome: Secondary | ICD-10-CM | POA: Insufficient documentation

## 2018-01-18 DIAGNOSIS — Z87891 Personal history of nicotine dependence: Secondary | ICD-10-CM | POA: Insufficient documentation

## 2018-01-18 DIAGNOSIS — I509 Heart failure, unspecified: Secondary | ICD-10-CM | POA: Insufficient documentation

## 2018-01-18 DIAGNOSIS — Z800 Family history of malignant neoplasm of digestive organs: Secondary | ICD-10-CM | POA: Insufficient documentation

## 2018-01-18 DIAGNOSIS — J45909 Unspecified asthma, uncomplicated: Secondary | ICD-10-CM | POA: Insufficient documentation

## 2018-01-18 DIAGNOSIS — Z86010 Personal history of colonic polyps: Secondary | ICD-10-CM | POA: Insufficient documentation

## 2018-01-18 DIAGNOSIS — Z833 Family history of diabetes mellitus: Secondary | ICD-10-CM | POA: Insufficient documentation

## 2018-01-18 DIAGNOSIS — I10 Essential (primary) hypertension: Secondary | ICD-10-CM | POA: Insufficient documentation

## 2018-01-18 DIAGNOSIS — M545 Low back pain: Secondary | ICD-10-CM | POA: Insufficient documentation

## 2018-01-18 DIAGNOSIS — E119 Type 2 diabetes mellitus without complications: Secondary | ICD-10-CM | POA: Insufficient documentation

## 2018-01-18 DIAGNOSIS — Z90710 Acquired absence of both cervix and uterus: Secondary | ICD-10-CM | POA: Insufficient documentation

## 2018-01-18 DIAGNOSIS — Z9841 Cataract extraction status, right eye: Secondary | ICD-10-CM | POA: Insufficient documentation

## 2018-01-20 ENCOUNTER — Encounter: Payer: Medicare Other | Admitting: Physical Therapy

## 2018-01-24 ENCOUNTER — Encounter: Payer: Self-pay | Admitting: Physical Therapy

## 2018-01-24 ENCOUNTER — Other Ambulatory Visit: Payer: Self-pay | Admitting: Internal Medicine

## 2018-01-24 ENCOUNTER — Ambulatory Visit: Payer: Medicare Other | Admitting: Physical Therapy

## 2018-01-24 DIAGNOSIS — M6281 Muscle weakness (generalized): Secondary | ICD-10-CM

## 2018-01-24 DIAGNOSIS — R262 Difficulty in walking, not elsewhere classified: Secondary | ICD-10-CM

## 2018-01-24 DIAGNOSIS — Z1231 Encounter for screening mammogram for malignant neoplasm of breast: Secondary | ICD-10-CM

## 2018-01-24 DIAGNOSIS — M25562 Pain in left knee: Secondary | ICD-10-CM

## 2018-01-24 DIAGNOSIS — G8929 Other chronic pain: Secondary | ICD-10-CM | POA: Diagnosis not present

## 2018-01-24 DIAGNOSIS — M25572 Pain in left ankle and joints of left foot: Secondary | ICD-10-CM | POA: Diagnosis not present

## 2018-01-24 NOTE — Therapy (Signed)
[place], [place], [address] Phone: [phone]   Fax:  [phone]  Physical Therapy Treatment  Patient Details  Name: Heidi [name] MRN: [id number] Date of Birth: 1951-04-09 Referring Provider: Meredith Pel    Encounter Date: 01/24/2018  PT End of Session - 01/24/18 0850    Visit Number  7    Number of Visits  13    Date for PT Re-Evaluation  02/07/18    Authorization - Visit Number  7    Authorization - Number of Visits  10    PT Start Time  0805    PT Stop Time  3893    PT Time Calculation (min)  38 min    Activity Tolerance  Patient tolerated treatment well    Behavior During Therapy  [facility] for tasks assessed/performed       Past Medical History:  Diagnosis Date  . Adenomatous colon polyp   . Anemia   . Anxiety   . Asthma   . CHF (congestive heart failure) ([place])   . Diabetes mellitus without complication ([place])   . High cholesterol   . Hypertension   . Left knee injury   . Pneumonia     Past Surgical History:  Procedure Laterality Date  . ABDOMINAL HYSTERECTOMY    . CATARACT EXTRACTION Left   . CATARACT EXTRACTION     LT 01/2017, 02/2017 on RT  . CESAREAN SECTION      There were no vitals filed for this visit.  Subjective Assessment - 01/24/18 0811    Subjective  Doing OK.  Exercises going OK at home    Currently in Pain?  Yes    Pain Score  -- mild  "OK"    Pain Location  Knee    Pain Orientation  Left;Lateral    Pain Descriptors / Indicators  Dull    Pain Type  Chronic pain    Pain Frequency  Intermittent    Aggravating Factors   walking,  standing too long    Pain Relieving Factors  laying down  rest    Effect of Pain on Daily Activities  moderate    Pain Score  0    Pain Location  Ankle    Pain Orientation  Left    Pain Descriptors / Indicators  -- no descriptore given when asked      Pain Type  Chronic pain    Aggravating Factors   walking a lot    Pain  Relieving Factors  rest                       [facility] Adult PT Treatment/Exercise - 01/24/18 0001      Knee/Hip Exercises: Stretches   Passive Hamstring Stretch  3 reps;30 seconds strap    Gastroc Stretch  3 reps;30 seconds incline      Knee/Hip Exercises: Standing   Heel Raises  Both;10 reps toe lifts 10 x ,  small range    SLS  holding chair 5 x 10 seconds each      Knee/Hip Exercises: Seated   Sit to Sand  10 reps      Knee/Hip Exercises: Supine   Quad Sets  10 reps    Short Arc Quad Sets  10 reps;3 sets 3,5,5 LBS  8 LBS too heavy    Bridges  10 reps    Other Supine Knee/Hip Exercises  clams 10 X green band  Ankle Exercises: Standing   Other Standing Ankle Exercises  pro stretcxh PF/DF 10 X each      Ankle Exercises: Supine   Other Supine Ankle Exercises  manually resisted ankle 4 way 10 X mod cues  motor planning issues initially             PT Education - 01/24/18 0850    Education Details  No       PT Short Term Goals - 01/13/18 0830      PT SHORT TERM GOAL #1   Title  Patient to be compliant with appropriate HEP, to be updated PRN     Baseline  7/11- mixed compliance     Time  1    Period  Weeks    Status  On-going      PT SHORT TERM GOAL #2   Title  Patient to improve L ankle dorsiflexion to at least 10 degrees without pain in order to improve gait mechanics and balance     Baseline  7/11- 9 degrees supine     Time  3    Period  Weeks    Status  On-going      PT SHORT TERM GOAL #3   Title  Patient to improve L ankle inversion and eversion by 10 degrees without pain in order to improve gait mechanics and balance     Baseline  7/11- improving (flowsheets)    Time  3    Period  Weeks    Status  On-going        PT Long Term Goals - 01/13/18 2993      PT LONG TERM GOAL #1   Title  Patient to improve functional strength by 1 MMT grade on all tested planes in order to reduce pain and improve tolerance to activity     Baseline   7/11- flowsheets     Time  6    Period  Weeks    Status  On-going      PT LONG TERM GOAL #2   Title  Patient to report pain as being no more than 2/10 L ankle and knee in order to improve QOL and activity tolerance     Baseline  7/11- 2/10 this morning, at worst 9/10     Time  6    Period  Weeks    Status  On-going      PT LONG TERM GOAL #3   Title  Patient to be able to ambulate at least 1086ft without assisstive device without pain and with no loss of balance in order to show improved gait and balance skills     Baseline  7/11- 137ft     Time  6    Period  Weeks    Status  On-going      PT LONG TERM GOAL #4   Title  Patient to demonstrate improved ankle stability and balance as demonstrated by ability to maintain SLS for at least 30 seconds on solid surface     Baseline  7/11- DNT     Time  6    Period  Weeks    Status  On-going            Plan - 01/24/18 0850    Clinical Impression Statement  Focus on ankle strength.  Pain did not increase with increased challanges from exercise.  DF functionally limited    PT Next Visit Plan  continue gross strengthening program, increase focus on ankle,  standing  calf stretch    PT Home Exercise Plan  Eval: red TB LAQs and hamstring curls, ankle circles L ,  ankle towel inversion/eversion, towel DF stretch, toe scrunches, SLR, supine clam green band, hamstring stretch supine with towel, supine 4 way SLR,  bridge.    Consulted and Agree with Plan of Care  Patient       Patient will benefit from skilled therapeutic intervention in order to improve the following deficits and impairments:     Visit Diagnosis: Pain in left ankle and joints of left foot  Chronic pain of left knee  Muscle weakness (generalized)  Difficulty in walking, not elsewhere classified     Problem List Patient Active Problem List   Diagnosis Date Noted  . Morbid obesity due to excess calories ([place]) complicated by hbp/ dm / hyperlipidemia 09/10/2017  .  Cough variant asthma vs UACS/vcd on ACEi  09/09/2017  . Anxiety and depression 09/06/2017  . Mild persistent asthma without complication 00/93/8182  . Paranoia ([place]) 09/06/2017  . Torn left ear lobe 11/20/2016  . Chronic migraine w/o aura w/o status migrainosus, not intractable 06/27/2016  . Post-menopausal atrophic vaginitis 11/14/2015  . Primary osteoarthritis of both knees 07/18/2015  . De Quervain's tenosynovitis, right 05/08/2015  . GERD (gastroesophageal reflux disease) 05/08/2015  . Renal cyst, right 05/08/2015  . Adenomatous polyp of colon 03/26/2015  . Essential hypertension 02/12/2015  . Cognitive deficit due to old head trauma 02/12/2015  . Asthma, chronic 02/12/2015  . Chronic left shoulder pain 02/11/2015  . Thyroid nodule 12/18/2014  . Chronic neck pain   . HLD (hyperlipidemia)   . OSA treated with BiPAP 11/27/2014  . Obesity hypoventilation syndrome ([place]) 11/27/2014  . Demand ischemia ([place])   . Diabetes type 2, uncontrolled ([place])   . Cardiomegaly 11/25/2014  . CHF (congestive heart failure) ([place]) 11/25/2014  . DM type 2 causing CKD stage 2 ([place]) 11/25/2014    Deanglo Hissong  PTA 01/24/2018, 8:53 AM  [facility] [address] [place], [place], [address] Phone: [phone]   Fax:  [phone]  Name: Heidi [name] MRN: [id number] Date of Birth: 1950-09-13

## 2018-01-25 DIAGNOSIS — H35351 Cystoid macular degeneration, right eye: Secondary | ICD-10-CM | POA: Diagnosis not present

## 2018-01-25 DIAGNOSIS — E119 Type 2 diabetes mellitus without complications: Secondary | ICD-10-CM | POA: Diagnosis not present

## 2018-01-27 ENCOUNTER — Encounter (HOSPITAL_COMMUNITY): Payer: Self-pay

## 2018-01-27 ENCOUNTER — Emergency Department (HOSPITAL_COMMUNITY): Payer: Medicare Other

## 2018-01-27 ENCOUNTER — Other Ambulatory Visit: Payer: Self-pay

## 2018-01-27 ENCOUNTER — Emergency Department (HOSPITAL_COMMUNITY)
Admission: EM | Admit: 2018-01-27 | Discharge: 2018-01-28 | Disposition: A | Discharge status: Home / Self Care | Payer: Medicare Other | Attending: Emergency Medicine | Admitting: Emergency Medicine

## 2018-01-27 ENCOUNTER — Ambulatory Visit: Payer: Medicare Other | Admitting: Physical Therapy

## 2018-01-27 ENCOUNTER — Encounter: Payer: Self-pay | Admitting: Physical Therapy

## 2018-01-27 DIAGNOSIS — R262 Difficulty in walking, not elsewhere classified: Secondary | ICD-10-CM

## 2018-01-27 DIAGNOSIS — Z79899 Other long term (current) drug therapy: Secondary | ICD-10-CM | POA: Insufficient documentation

## 2018-01-27 DIAGNOSIS — E876 Hypokalemia: Secondary | ICD-10-CM

## 2018-01-27 DIAGNOSIS — N182 Chronic kidney disease, stage 2 (mild): Secondary | ICD-10-CM | POA: Insufficient documentation

## 2018-01-27 DIAGNOSIS — Z794 Long term (current) use of insulin: Secondary | ICD-10-CM | POA: Insufficient documentation

## 2018-01-27 DIAGNOSIS — M25562 Pain in left knee: Secondary | ICD-10-CM | POA: Diagnosis not present

## 2018-01-27 DIAGNOSIS — I5033 Acute on chronic diastolic (congestive) heart failure: Secondary | ICD-10-CM

## 2018-01-27 DIAGNOSIS — R202 Paresthesia of skin: Secondary | ICD-10-CM

## 2018-01-27 DIAGNOSIS — E1122 Type 2 diabetes mellitus with diabetic chronic kidney disease: Secondary | ICD-10-CM | POA: Insufficient documentation

## 2018-01-27 DIAGNOSIS — I130 Hypertensive heart and chronic kidney disease with heart failure and stage 1 through stage 4 chronic kidney disease, or unspecified chronic kidney disease: Secondary | ICD-10-CM | POA: Diagnosis not present

## 2018-01-27 DIAGNOSIS — E785 Hyperlipidemia, unspecified: Secondary | ICD-10-CM | POA: Insufficient documentation

## 2018-01-27 DIAGNOSIS — J4520 Mild intermittent asthma, uncomplicated: Secondary | ICD-10-CM | POA: Insufficient documentation

## 2018-01-27 DIAGNOSIS — Z7982 Long term (current) use of aspirin: Secondary | ICD-10-CM | POA: Insufficient documentation

## 2018-01-27 DIAGNOSIS — R200 Anesthesia of skin: Secondary | ICD-10-CM | POA: Diagnosis not present

## 2018-01-27 DIAGNOSIS — M6281 Muscle weakness (generalized): Secondary | ICD-10-CM

## 2018-01-27 DIAGNOSIS — G8929 Other chronic pain: Secondary | ICD-10-CM

## 2018-01-27 DIAGNOSIS — I10 Essential (primary) hypertension: Secondary | ICD-10-CM

## 2018-01-27 DIAGNOSIS — R51 Headache: Secondary | ICD-10-CM | POA: Diagnosis not present

## 2018-01-27 DIAGNOSIS — M25572 Pain in left ankle and joints of left foot: Secondary | ICD-10-CM

## 2018-01-27 DIAGNOSIS — I509 Heart failure, unspecified: Secondary | ICD-10-CM | POA: Insufficient documentation

## 2018-01-27 DIAGNOSIS — I110 Hypertensive heart disease with heart failure: Secondary | ICD-10-CM | POA: Insufficient documentation

## 2018-01-27 DIAGNOSIS — R5383 Other fatigue: Secondary | ICD-10-CM | POA: Insufficient documentation

## 2018-01-27 DIAGNOSIS — E7800 Pure hypercholesterolemia, unspecified: Secondary | ICD-10-CM | POA: Insufficient documentation

## 2018-01-27 DIAGNOSIS — R531 Weakness: Secondary | ICD-10-CM | POA: Diagnosis not present

## 2018-01-27 LAB — CBC
HCT: 40.9 % (ref 36.0–46.0)
HEMOGLOBIN: 12.9 g/dL (ref 12.0–15.0)
MCH: 27.4 pg (ref 26.0–34.0)
MCHC: 31.5 g/dL (ref 30.0–36.0)
MCV: 87 fL (ref 78.0–100.0)
PLATELETS: 217 10*3/uL (ref 150–400)
RBC: 4.7 MIL/uL (ref 3.87–5.11)
RDW: 14.7 % (ref 11.5–15.5)
WBC: 8.4 10*3/uL (ref 4.0–10.5)

## 2018-01-27 LAB — I-STAT CHEM 8, ED
BUN: 20 mg/dL (ref 8–23)
Calcium, Ion: 1.13 mmol/L — ABNORMAL LOW (ref 1.15–1.40)
Chloride: 103 mmol/L (ref 98–111)
Creatinine, Ser: 1.3 mg/dL — ABNORMAL HIGH (ref 0.44–1.00)
Glucose, Bld: 99 mg/dL (ref 70–99)
HEMATOCRIT: 41 % (ref 36.0–46.0)
HEMOGLOBIN: 13.9 g/dL (ref 12.0–15.0)
POTASSIUM: 3.2 mmol/L — AB (ref 3.5–5.1)
Sodium: 141 mmol/L (ref 135–145)
TCO2: 24 mmol/L (ref 22–32)

## 2018-01-27 LAB — DIFFERENTIAL
ABS IMMATURE GRANULOCYTES: 0.1 10*3/uL (ref 0.0–0.1)
BASOS ABS: 0.1 10*3/uL (ref 0.0–0.1)
BASOS PCT: 1 %
EOS ABS: 0.3 10*3/uL (ref 0.0–0.7)
Eosinophils Relative: 4 %
Immature Granulocytes: 1 %
Lymphocytes Relative: 41 %
Lymphs Abs: 3.4 10*3/uL (ref 0.7–4.0)
Monocytes Absolute: 0.7 10*3/uL (ref 0.1–1.0)
Monocytes Relative: 8 %
NEUTROS ABS: 3.8 10*3/uL (ref 1.7–7.7)
NEUTROS PCT: 45 %

## 2018-01-27 LAB — CBG MONITORING, ED: Glucose-Capillary: 103 mg/dL — ABNORMAL HIGH (ref 70–99)

## 2018-01-27 LAB — COMPREHENSIVE METABOLIC PANEL
ALT: 31 U/L (ref 0–44)
AST: 26 U/L (ref 15–41)
Albumin: 4 g/dL (ref 3.5–5.0)
Alkaline Phosphatase: 93 U/L (ref 38–126)
Anion gap: 10 (ref 5–15)
BUN: 20 mg/dL (ref 8–23)
CHLORIDE: 106 mmol/L (ref 98–111)
CO2: 26 mmol/L (ref 22–32)
Calcium: 9.6 mg/dL (ref 8.9–10.3)
Creatinine, Ser: 1.24 mg/dL — ABNORMAL HIGH (ref 0.44–1.00)
GFR, EST AFRICAN AMERICAN: 52 mL/min — AB (ref >60)
GFR, EST NON AFRICAN AMERICAN: 45 mL/min — AB (ref >60)
Glucose, Bld: 99 mg/dL (ref 70–99)
Potassium: 3.3 mmol/L — ABNORMAL LOW (ref 3.5–5.1)
SODIUM: 142 mmol/L (ref 135–145)
Total Bilirubin: 0.5 mg/dL (ref 0.3–1.2)
Total Protein: 6.8 g/dL (ref 6.5–8.1)

## 2018-01-27 LAB — I-STAT TROPONIN, ED: Troponin i, poc: 0.01 ng/mL (ref 0.00–0.08)

## 2018-01-27 LAB — PROTIME-INR
INR: 0.95
PROTHROMBIN TIME: 12.6 s (ref 11.4–15.2)

## 2018-01-27 LAB — APTT: APTT: 32 s (ref 24–36)

## 2018-01-27 MED ORDER — ATORVASTATIN CALCIUM 40 MG PO TABS: ORAL_TABLET | ORAL | 0 refills | Status: DC

## 2018-01-27 NOTE — Therapy (Signed)
[place], [place], [address] Phone: [phone]   Fax:  [phone]  Physical Therapy Treatment  Patient Details  Name: Heidi [name] MRN: [id number] Date of Birth: 1950-07-08 Referring Provider: Meredith Pel    Encounter Date: 01/27/2018  PT End of Session - 01/27/18 1059    Visit Number  8    Number of Visits  13    Date for PT Re-Evaluation  02/07/18    Authorization - Visit Number  8    Authorization - Number of Visits  10    PT Start Time  [phone] Short session ,  patient late    PT Stop Time  0843    PT Time Calculation (min)  26 min    Activity Tolerance  Patient tolerated treatment well    Behavior During Therapy  [facility] for tasks assessed/performed       Past Medical History:  Diagnosis Date  . Adenomatous colon polyp   . Anemia   . Anxiety   . Asthma   . CHF (congestive heart failure) ([place])   . Diabetes mellitus without complication ([place])   . High cholesterol   . Hypertension   . Left knee injury   . Pneumonia     Past Surgical History:  Procedure Laterality Date  . ABDOMINAL HYSTERECTOMY    . CATARACT EXTRACTION Left   . CATARACT EXTRACTION     LT 01/2017, 02/2017 on RT  . CESAREAN SECTION      There were no vitals filed for this visit.  Subjective Assessment - 01/27/18 1037    Subjective  I'm sorry I am late. My pain is a 9/10 in my knee.  i over did it.  I walked without my cane and I forgor my brace when I took my dog into [employer]  and it was raining.     Currently in Pain?  Yes    Pain Score  9     Pain Location  Knee    Pain Orientation  Left;Anterior;Lateral    Pain Descriptors / Indicators  Aching;Sharp    Pain Frequency  Constant    Aggravating Factors   walking without cane or brace    Pain Relieving Factors  using brace,  rest    Effect of Pain on Daily Activities  severe    Pain Score  -- No number given when asked    Pain Location  Ankle    Pain  Orientation  Left;Lateral    Pain Descriptors / Indicators  -- hard to describe    Pain Type  Chronic pain    Aggravating Factors   walking a lot    Pain Relieving Factors  rest         [facility] PT Assessment - 01/27/18 0001      AROM   Left Ankle Dorsiflexion  4    Left Ankle Inversion  28    Left Ankle Eversion  15                   [facility] Adult PT Treatment/Exercise - 01/27/18 0001      Self-Care   Self-Care  Other Self-Care Comments    Other Self-Care Comments   ankle anatomy yo  answer questions for patient .  chart used.  she has requested similar info to take home  will try to fing prior to next visit.      Knee/Hip Exercises: Stretches   Best boy  3 reps;30 seconds      Knee/Hip Exercises: Standing   SLS  tried to do upon arrival and she did not tolerate . She needed to sit down immediately with 9/10 pain.  Post session she wobbled but held 1-2 seconds.        Knee/Hip Exercises: Supine   Quad Sets  1 set;10 reps    Short Arc Quad Sets  10 reps;2 sets    Other Supine Knee/Hip Exercises  resisted knee flexion10 X from foam roller,  2 sets      Ankle Exercises: Supine   T-Band  4 way R ankle x10 with red TB  2 sets,  no pain             PT Education - 01/27/18 1059    Education Details  Yes ankle anatomy,  how it works    [employer]) Educated  Patient    Methods  Explanation    Comprehension  Verbalized understanding       PT Short Term Goals - 01/27/18 1104      PT SHORT TERM GOAL #1   Title  Patient to be compliant with appropriate HEP, to be updated PRN     Time  1    Period  Weeks    Status  Unable to assess      PT SHORT TERM GOAL #2   Title  Patient to improve L ankle dorsiflexion to at least 10 degrees without pain in order to improve gait mechanics and balance     Baseline  7/11- 9 degrees supine , 7/25 - 4 ( prior to exercise)    Time  3    Period  Weeks    Status  On-going      PT SHORT TERM GOAL #3   Title   Patient to improve L ankle inversion and eversion by 10 degrees without pain in order to improve gait mechanics and balance     Baseline  IV 28,  EV 15    Time  3    Period  Weeks    Status  Partially Met      PT SHORT TERM GOAL #4   Title  She will demo understanding of good sitting posture with support    Time  3    Period  Weeks    Status  Unable to assess        PT Long Term Goals - 01/13/18 7096      PT LONG TERM GOAL #1   Title  Patient to improve functional strength by 1 MMT grade on all tested planes in order to reduce pain and improve tolerance to activity     Baseline  7/11- flowsheets     Time  6    Period  Weeks    Status  On-going      PT LONG TERM GOAL #2   Title  Patient to report pain as being no more than 2/10 L ankle and knee in order to improve QOL and activity tolerance     Baseline  7/11- 2/10 this morning, at worst 9/10     Time  6    Period  Weeks    Status  On-going      PT LONG TERM GOAL #3   Title  Patient to be able to ambulate at least 1065f without assisstive device without pain and with no loss of balance in order to show improved gait and balance skills  Baseline  7/11- 153f     Time  6    Period  Weeks    Status  On-going      PT LONG TERM GOAL #4   Title  Patient to demonstrate improved ankle stability and balance as demonstrated by ability to maintain SLS for at least 30 seconds on solid surface     Baseline  7/11- DNT     Time  6    Period  Weeks    Status  On-going            Plan - 01/27/18 1100    Clinical Impression Statement  Patient arrived with 9/10 pain  Knee ( Her knee had been doing well) , she did too much without her cane or knee brace.  Less pain at end of session.  SLS 1 second with wobbles.  She was at risk for falling today/  Advised to use cane at all times.    Ankle AROM improving  , Goal partially met.  see flow sheet.    Patient declined the need of modalities today.  She will do at home.     PT Next  Visit Plan  continue gross strengthening program, increase focus on ankle,  standing calf stretch.  POC ends soon.  FOTO?  May need a BERG.    PT Home Exercise Plan  Eval: red TB LAQs and hamstring curls, ankle circles L ,  ankle towel inversion/eversion, towel DF stretch, toe scrunches, SLR, supine clam green band, hamstring stretch supine with towel, supine 4 way SLR,  bridge.    Consulted and Agree with Plan of Care  Patient       Patient will benefit from skilled therapeutic intervention in order to improve the following deficits and impairments:     Visit Diagnosis: Pain in left ankle and joints of left foot  Chronic pain of left knee  Muscle weakness (generalized)  Difficulty in walking, not elsewhere classified     Problem List Patient Active Problem List   Diagnosis Date Noted  . Morbid obesity due to excess calories ([place] complicated by hbp/ dm / hyperlipidemia 09/10/2017  . Cough variant asthma vs UACS/vcd on ACEi  09/09/2017  . Anxiety and depression 09/06/2017  . Mild persistent asthma without complication 031/51/7616 . Paranoia ([place] 09/06/2017  . Torn left ear lobe 11/20/2016  . Chronic migraine w/o aura w/o status migrainosus, not intractable 06/27/2016  . Post-menopausal atrophic vaginitis 11/14/2015  . Primary osteoarthritis of both knees 07/18/2015  . De Quervain's tenosynovitis, right 05/08/2015  . GERD (gastroesophageal reflux disease) 05/08/2015  . Renal cyst, right 05/08/2015  . Adenomatous polyp of colon 03/26/2015  . Essential hypertension 02/12/2015  . Cognitive deficit due to old head trauma 02/12/2015  . Asthma, chronic 02/12/2015  . Chronic left shoulder pain 02/11/2015  . Thyroid nodule 12/18/2014  . Chronic neck pain   . HLD (hyperlipidemia)   . OSA treated with BiPAP 11/27/2014  . Obesity hypoventilation syndrome ([place] 11/27/2014  . Demand ischemia ([place]   . Diabetes type 2, uncontrolled ([place]   . Cardiomegaly 11/25/2014  . CHF  (congestive heart failure) ([place] 11/25/2014  . DM type 2 causing CKD stage 2 ([place] 11/25/2014    HARRIS,KAREN  PTA 01/27/2018, 11:08 AM  [facility] [address]Phone: [phone]  Fax:  [phone] Name: Heidi AguadoMRN: [id number] of Birth: [birth year]

## 2018-01-27 NOTE — ED Notes (Signed)
Pt came to door of room and yelled at this RN, "Can you come put a sheet on this chair so I can lay down on it because I don't feel good" This RN informed pt that she was going back to waiting room and that she was in triage at the moment for blood draw, vs, and ekg. All of that has been complete. Then pt stated "well get me out there then so I don't have to sit in this uncomfortable wheelchair and don't think I didn't hear your smart ass comment whilago" This RN asked the pt which comment she was referring to having no knowledge of the complaint and pt replied "you know which smart ass comment I was referring to, just get me out of here, just because you wear that uniform doesn't make you a doctor" Pt wheeled back to waiting room and stayed in wheelchair. Informed that she would be going to CT scan as soon as possible. Pt continued to be verbally aggressive with this RN. During triage pt never acted this way and there appeared to be no problem during assessment.

## 2018-01-27 NOTE — ED Notes (Signed)
Pt called vitals with no response.

## 2018-01-27 NOTE — ED Triage Notes (Addendum)
Pt endorses numbness/tingling all over with generalized weakness since last night. Pt went to physical therapy today and couldn't stand on one leg due to weakness. VSS. No neuro deficits. Also endorses some blurred vision for several days.

## 2018-01-27 NOTE — Patient Instructions (Signed)
Use cane for safety.

## 2018-01-28 ENCOUNTER — Other Ambulatory Visit: Payer: Self-pay | Admitting: Internal Medicine

## 2018-01-28 ENCOUNTER — Other Ambulatory Visit: Payer: Self-pay | Admitting: Neurology

## 2018-01-28 DIAGNOSIS — E1122 Type 2 diabetes mellitus with diabetic chronic kidney disease: Secondary | ICD-10-CM

## 2018-01-28 DIAGNOSIS — N182 Chronic kidney disease, stage 2 (mild): Secondary | ICD-10-CM

## 2018-01-28 DIAGNOSIS — R200 Anesthesia of skin: Secondary | ICD-10-CM | POA: Diagnosis not present

## 2018-01-28 DIAGNOSIS — K219 Gastro-esophageal reflux disease without esophagitis: Secondary | ICD-10-CM

## 2018-01-28 DIAGNOSIS — I10 Essential (primary) hypertension: Secondary | ICD-10-CM

## 2018-01-28 LAB — BRAIN NATRIURETIC PEPTIDE: B Natriuretic Peptide: 8.3 pg/mL (ref 0.0–100.0)

## 2018-01-28 LAB — CBG MONITORING, ED: GLUCOSE-CAPILLARY: 182 mg/dL — AB (ref 70–99)

## 2018-01-28 MED ORDER — POTASSIUM CHLORIDE ER 20 MEQ PO TBCR: 20.0000 meq | EXTENDED_RELEASE_TABLET | Freq: Every day | ORAL | 0 refills | Status: DC

## 2018-01-28 NOTE — Telephone Encounter (Signed)
Pt given 3 month supply of potassium PO in 09/2017. Will forward to PCP to determine appropriateness of refill.

## 2018-01-28 NOTE — ED Notes (Signed)
DR Leonette Monarch at bedside.

## 2018-01-28 NOTE — ED Provider Notes (Signed)
[facility] EMERGENCY DEPARTMENT Provider Note  CSN: [id number] Arrival date & time: 01/27/18 1832  Chief Complaint(s) Tingling; Numbness; and Weakness  HPI Heidi [name] is a 67 y.o. female with extensive past medical history listed below  The history is provided by the patient.  Weakness  Primary symptoms comment: generalized. This is a new problem. The current episode started 2 days ago. The problem has not changed since onset.Affected Side: none. There has been no fever. Associated symptoms include headaches. Pertinent negatives include no shortness of breath, no chest pain, no vomiting, no altered mental status and no confusion. Associated medical issues do not include trauma or CVA.   Patient also endorsing  loss of sensation in all her extremities.  She denies any falls or trauma.  Denies any recent fevers or infections.  No visual disturbance.  No chest pain or shortness of breath.  No abdominal pain.  No nausea or vomiting.  No urinary symptoms.  She does endorse generalized fatigue as noted above.   Past Medical History Past Medical History:  Diagnosis Date  . Adenomatous colon polyp   . Anemia   . Anxiety   . Asthma   . CHF (congestive heart failure) ([place])   . Diabetes mellitus without complication ([place])   . High cholesterol   . Hypertension   . Left knee injury   . Pneumonia    Patient Active Problem List   Diagnosis Date Noted  . Morbid obesity due to excess calories ([place]) complicated by hbp/ dm / hyperlipidemia 09/10/2017  . Cough variant asthma vs UACS/vcd on ACEi  09/09/2017  . Anxiety and depression 09/06/2017  . Mild persistent asthma without complication 41/74/0814  . Paranoia ([place]) 09/06/2017  . Torn left ear lobe 11/20/2016  . Chronic migraine w/o aura w/o status migrainosus, not intractable 06/27/2016  . Post-menopausal atrophic vaginitis 11/14/2015  . Primary osteoarthritis of both knees 07/18/2015  . De Quervain's tenosynovitis,  right 05/08/2015  . GERD (gastroesophageal reflux disease) 05/08/2015  . Renal cyst, right 05/08/2015  . Adenomatous polyp of colon 03/26/2015  . Essential hypertension 02/12/2015  . Cognitive deficit due to old head trauma 02/12/2015  . Asthma, chronic 02/12/2015  . Chronic left shoulder pain 02/11/2015  . Thyroid nodule 12/18/2014  . Chronic neck pain   . HLD (hyperlipidemia)   . OSA treated with BiPAP 11/27/2014  . Obesity hypoventilation syndrome ([place]) 11/27/2014  . Demand ischemia ([place])   . Diabetes type 2, uncontrolled ([place])   . Cardiomegaly 11/25/2014  . CHF (congestive heart failure) ([place]) 11/25/2014  . DM type 2 causing CKD stage 2 ([place]) 11/25/2014   Home Medication(s) Prior to Admission medications   Medication Sig Start Date End Date Taking? Authorizing Provider  Acetaminophen-Codeine 300-30 MG tablet Take 1 tablet by mouth 2 (two) times daily. 01/04/18   Kirsteins, Luanna Salk, MD  albuterol (PROVENTIL HFA;VENTOLIN HFA) 108 (90 Base) MCG/ACT inhaler INHALE 2 PUFFS INTO THE LUNGS EVERY 6 HOURS AS NEEDED FOR WHEEZING OR SHORTNESS OF BREATH 09/06/17   Ladell Pier, MD  albuterol (PROVENTIL) (2.5 MG/3ML) 0.083% nebulizer solution INHALE CONTENTS OF 1 VIAL VIA NEBULIZER EVERY 6 HOURS AS NEEDED FOR WHEEZING OR SHORTNESS OF BREATH 10/07/17   Ladell Pier, MD  Ascorbic Acid (VITAMIN C PO) Take 1 tablet by mouth daily.    [provider]  aspirin 81 MG chewable tablet Chew 81 mg by mouth daily.     [provider]  atenolol (TENORMIN) 25 MG tablet  Take 1 tablet (25 mg total) by mouth daily. 02/26/17   Ladell Pier, MD  atorvastatin (LIPITOR) 40 MG tablet Take 1 tablet by mouth daily at 6pm 01/27/18   Ladell Pier, MD  bumetanide (BUMEX) 0.5 MG tablet Take 1 tablet (0.5 mg total) by mouth daily. 02/26/17   Ladell Pier, MD  calcium-vitamin D (OSCAL WITH D) 500-200 MG-UNIT tablet Take 1 tablet by mouth daily. 06/04/17   Ladell Pier, MD    cetirizine (ZYRTEC) 10 MG tablet Take 1 tablet (10 mg total) by mouth daily. 12/21/17   Zigmund Gottron, NP  conjugated estrogens (PREMARIN) vaginal cream INSERT 0.5 GRAM VAGINALLY 1 TIME A WEEK 12/31/17   Ladell Pier, MD  diclofenac sodium (VOLTAREN) 1 % GEL Apply 2 g topically 4 (four) times daily. 08/23/17   Ladell Pier, MD  fluticasone (FLONASE) 50 MCG/ACT nasal spray Place 1 spray into both nostrils daily. 12/21/17   Augusto Gamble B, NP  glucose blood (FREESTYLE TEST STRIPS) test strip Use as instructed for 3 times daily testing of blood glucose. 05/31/17   Ladell Pier, MD  hydrochlorothiazide (HYDRODIURIL) 25 MG tablet Take 1 tablet (25 mg total) by mouth daily. 02/26/17   Ladell Pier, MD  insulin lispro protamine-lispro (HUMALOG 75/25 MIX) (75-25) 100 UNIT/ML SUSP injection Inject subcutaneously 80 U in the morning with food and 55 U at in evening with meals 09/06/17   Ladell Pier, MD  INSULIN SYRINGE 1CC/29G 29G X 1/2" 1 ML MISC USE THREE TIMES DAILY AS DIRECTED 12/21/17   Ladell Pier, MD  Lancets (FREESTYLE) lancets Use as directed 3 times daily. E11.9 06/08/17   Ladell Pier, MD  losartan (COZAAR) 50 MG tablet Take 1 tablet (50 mg total) by mouth daily. 09/09/17   Tanda Rockers, MD  montelukast (SINGULAIR) 10 MG tablet TAKE 1 TABLET(10 MG) BY MOUTH DAILY 12/01/17   Ladell Pier, MD  Multiple Vitamin (MULTIVITAMIN WITH MINERALS) TABS tablet Take 1 tablet by mouth daily.    [provider]  nitroGLYCERIN (NITROSTAT) 0.3 MG SL tablet Place 1 tablet (0.3 mg total) under the tongue every 5 (five) minutes as needed for chest pain. Max 3 tablets in 15 minutes 08/07/16   Funches, Adriana Mccallum, MD  nizatidine (AXID) 150 MG capsule Take 1 capsule (150 mg total) by mouth at bedtime. 12/07/17   Tanda Rockers, MD  pantoprazole (PROTONIX) 40 MG tablet TAKE 1 TABLET(40 MG) BY MOUTH DAILY 30 TO 60 MINUTES BEFORE FIRST MEAL OF THE DAY 12/06/17   Tanda Rockers,  MD  Potassium Chloride ER 20 MEQ TBCR Take 20 mEq by mouth daily. 09/16/17   Ladell Pier, MD  topiramate (TOPAMAX) 25 MG capsule Take 3 capsules (75 mg total) by mouth 2 (two) times daily. 01/12/17   Melvenia Beam, MD  Past Surgical History Past Surgical History:  Procedure Laterality Date  . ABDOMINAL HYSTERECTOMY    . CATARACT EXTRACTION Left   . CATARACT EXTRACTION     LT 01/2017, 02/2017 on RT  . CESAREAN SECTION     Family History Family History  Problem Relation Age of Onset  . Colon cancer Father   . Diabetes type II Sister   . Diabetes type II Brother   . Colon cancer Sister   . Migraines Neg Hx     Social History Social History   Tobacco Use  . Smoking status: Never Smoker  . Smokeless tobacco: Never Used  Substance Use Topics  . Alcohol use: No  . Drug use: No   Allergies Patient has no known allergies.  Review of Systems Review of Systems  Respiratory: Negative for shortness of breath.   Cardiovascular: Negative for chest pain.  Gastrointestinal: Negative for vomiting.  Neurological: Positive for weakness and headaches.  Psychiatric/Behavioral: Negative for confusion.   All other systems are reviewed and are negative for acute change except as noted in the HPI  Physical Exam Vital Signs  I have reviewed the triage vital signs BP (!) 119/51 (BP Location: Left Arm)   Pulse 81   Temp 97.9 F (36.6 C) (Oral)   Resp 16   SpO2 93%   Physical Exam  ED Results and Treatments Labs (all labs ordered are listed, but only abnormal results are displayed) Labs Reviewed  COMPREHENSIVE METABOLIC PANEL - Abnormal; Notable for the following components:      Result Value   Potassium 3.3 (*)    Creatinine, Ser 1.24 (*)    GFR calc non Af Amer 45 (*)    GFR calc Af Amer 52 (*)    All other components within normal limits    CBG MONITORING, ED - Abnormal; Notable for the following components:   Glucose-Capillary 103 (*)    All other components within normal limits  I-STAT CHEM 8, ED - Abnormal; Notable for the following components:   Potassium 3.2 (*)    Creatinine, Ser 1.30 (*)    Calcium, Ion 1.13 (*)    All other components within normal limits  CBG MONITORING, ED - Abnormal; Notable for the following components:   Glucose-Capillary 182 (*)    All other components within normal limits  PROTIME-INR  APTT  CBC  DIFFERENTIAL  BRAIN NATRIURETIC PEPTIDE  I-STAT TROPONIN, ED                                                                                                                         EKG  EKG Interpretation  Date/Time:  Thursday January 27 2018 18:46:45 EDT Ventricular Rate:  79 PR Interval:  188 QRS Duration: 78 QT Interval:  366 QTC Calculation: 419 R Axis:   -29 Text Interpretation:  Normal sinus rhythm Low voltage QRS Possible Anterolateral infarct , age undetermined Abnormal ECG No significant change since last tracing Confirmed by Addison Lank [phone])  on 01/28/2018 12:10:16 AM      Radiology Ct Head Wo Contrast  Result Date: 01/27/2018 CLINICAL DATA:  Weakness and headache.  History of hypertension. EXAM: CT HEAD WITHOUT CONTRAST TECHNIQUE: Contiguous axial images were obtained from the base of the skull through the vertex without intravenous contrast. COMPARISON:  CT HEAD August 28, 2015 FINDINGS: BRAIN: No intraparenchymal hemorrhage, mass effect nor midline shift. The ventricles and sulci are normal for age. Patchy supratentorial white matter hypodensities within normal range for patient's age, though non-specific are most compatible with chronic small vessel ischemic disease. No acute large vascular territory infarcts. No abnormal extra-axial fluid collections. Basal cisterns are patent. VASCULAR: Moderate calcific atherosclerosis of the carotid siphons. SKULL: No skull fracture. No  significant scalp soft tissue swelling. SINUSES/ORBITS: The mastoid air-cells and included paranasal sinuses are well-aerated.The included ocular globes and orbital contents are non-suspicious. Status post bilateral ocular lens implants. OTHER: Patient is nearly edentulous. IMPRESSION: Negative non-contrast CT HEAD for age. Electronically Signed   By: Elon Alas M.D.   On: 01/27/2018 20:03   Pertinent labs & imaging results that were available during my care of the patient were reviewed by me and considered in my medical decision making (see chart for details).  Medications Ordered in ED Medications - No data to display                                                                                                                                  Procedures Procedures  (including critical care time)  Medical Decision Making / ED Course I have reviewed the nursing notes for this encounter and the patient's prior records (if available in EHR or on provided paperwork).    Generalized fatigue with paresthesias.  No focal deficits noted on exam.  CT obtained in triage grossly negative. Doubt CVA.  Labs without leukocytosis or anemia.  No significant electrolyte derangements or renal insufficiency.  She does have mild hypokalemia which is likely due to her being on Lasix.  Orthostatics reassuring.  Patient does have evidence of peripheral edema however lungs are clear to auscultation and BNP is within normal limits.  Doubt CHF exacerbation.  Patient does appear to be anxious during examination and concerned there is something very serious going on.  I personally went through all her findings and thought process.  The patient appears reasonably screened and/or stabilized for discharge and I doubt any other medical condition or other [facility] requiring further screening, evaluation, or treatment in the ED at this time prior to discharge.  The patient is safe for discharge with strict return  precautions.   Final Clinical Impression(s) / ED Diagnoses Final diagnoses:  Paresthesia  Hypokalemia  Fatigue, unspecified type   Disposition: Discharge  Condition: Good  I have discussed the results, Dx and Tx plan with the patient who expressed understanding and agree(s) with the plan. Discharge instructions discussed at great length. The patient was given  strict return precautions who verbalized understanding of the instructions. No further questions at time of discharge.    ED Discharge Orders    None       Follow Up: Ladell Pier, MD [place] [address] [phone]  Schedule an appointment as soon as possible for a visit  in 3-5 days, If symptoms do not improve or  worsen      This chart was dictated using voice recognition software.  Despite best efforts to proofread,  errors can occur which can change the documentation meaning.   Fatima Blank, MD 01/28/18 [phone]

## 2018-01-29 ENCOUNTER — Other Ambulatory Visit: Payer: Self-pay | Admitting: Internal Medicine

## 2018-01-29 DIAGNOSIS — IMO0002 Reserved for concepts with insufficient information to code with codable children: Secondary | ICD-10-CM

## 2018-01-29 DIAGNOSIS — E1165 Type 2 diabetes mellitus with hyperglycemia: Secondary | ICD-10-CM

## 2018-01-31 ENCOUNTER — Encounter: Payer: Self-pay | Admitting: Physical Therapy

## 2018-01-31 ENCOUNTER — Ambulatory Visit: Payer: Medicare Other | Admitting: Physical Therapy

## 2018-01-31 DIAGNOSIS — R262 Difficulty in walking, not elsewhere classified: Secondary | ICD-10-CM

## 2018-01-31 DIAGNOSIS — G8929 Other chronic pain: Secondary | ICD-10-CM

## 2018-01-31 DIAGNOSIS — M25572 Pain in left ankle and joints of left foot: Secondary | ICD-10-CM

## 2018-01-31 DIAGNOSIS — M6281 Muscle weakness (generalized): Secondary | ICD-10-CM

## 2018-01-31 DIAGNOSIS — M25562 Pain in left knee: Secondary | ICD-10-CM

## 2018-01-31 NOTE — Therapy (Addendum)
[place], [place], [address] Phone: [phone]   Fax:  [phone]  Physical Therapy Treatment / Discharge summary  Patient Details  Name: Heidi [name] MRN: [id number] Date of Birth: 1951-05-12 Referring Provider: Meredith Pel    Encounter Date: 01/31/2018  PT End of Session - 01/31/18 0951    Visit Number  9    Number of Visits  13    Date for PT Re-Evaluation  02/07/18    Authorization Type  Medicare/Medicaid (PN visit 10, KX visit 4)    Authorization Time Period  0/93/23 to 5/57/32; recert done 08/07/52  ( through 02/10/2018?)  [place]    Authorization - Visit Number  9    Authorization - Number of Visits  10    PT Start Time  0807    PT Stop Time  0845    PT Time Calculation (min)  38 min    Behavior During Therapy  [facility] for tasks assessed/performed       Past Medical History:  Diagnosis Date  . Adenomatous colon polyp   . Anemia   . Anxiety   . Asthma   . CHF (congestive heart failure) ([place])   . Diabetes mellitus without complication ([place])   . High cholesterol   . Hypertension   . Left knee injury   . Pneumonia     Past Surgical History:  Procedure Laterality Date  . ABDOMINAL HYSTERECTOMY    . CATARACT EXTRACTION Left   . CATARACT EXTRACTION     LT 01/2017, 02/2017 on RT  . CESAREAN SECTION      There were no vitals filed for this visit.  Subjective Assessment - 01/31/18 0818    Subjective  Went to ER for numb left side.  She was dehydrated.  She may need a medocation  adjustment.    Currently in Pain?  Yes    Pain Score  4     Pain Location  Knee    Pain Orientation  Left;Anterior;Lateral    Pain Descriptors / Indicators  Aching;Sharp    Pain Type  Chronic pain    Pain Radiating Towards  lateral and posterior    Pain Frequency  Constant    Aggravating Factors   walking without cane or brace    Pain Relieving Factors  rest,  using brace and cane    Pain Score  4    Pain  Location  Ankle    Pain Orientation  Left;Lateral    Pain Descriptors / Indicators  -- sore    Pain Type  Chronic pain    Aggravating Factors   walking a lot    Pain Relieving Factors  rest         [facility] PT Assessment - 01/31/18 0001      Observation/Other Assessments   Focus on Therapeutic Outcomes (FOTO)   37                   [facility] Adult PT Treatment/Exercise - 01/31/18 0001      Knee/Hip Exercises: Aerobic   Nustep  L3  X 5 minutes,  UE/LE      Knee/Hip Exercises: Standing   Hip Abduction  10 reps moving right LE.  Cued to keep left knee slightly flexed    Hip Extension  10 reps moving right    SLS  holding counter,  CGA  .  Improved tolerance.       Knee/Hip Exercises: Seated  Hamstring Curl  10 reps;2 sets yellow    Sit to Sand  10 reps      Ankle Exercises: Standing   Heel Raises  10 reps    Toe Raise  10 reps    Other Standing Ankle Exercises  pro stretcxh PF/DF 10 X each 5 second hold             PT Education - 01/31/18 0951    Education Details  No       PT Short Term Goals - 01/31/18 0957      PT SHORT TERM GOAL #1   Title  Patient to be compliant with appropriate HEP, to be updated PRN     Baseline  doing some,  has not been feeling well    Time  1    Period  Weeks    Status  On-going      PT SHORT TERM GOAL #2   Title  Patient to improve L ankle dorsiflexion to at least 10 degrees without pain in order to improve gait mechanics and balance     Time  3    Period  Weeks    Status  Unable to assess      PT SHORT TERM GOAL #3   Title  Patient to improve L ankle inversion and eversion by 10 degrees without pain in order to improve gait mechanics and balance     Time  3    Period  Weeks    Status  Unable to assess      PT SHORT TERM GOAL #4   Title  She will demo understanding of good sitting posture with support    Baseline  can improve with cues    Time  3    Period  Weeks    Status  On-going        PT Long Term  Goals - 01/13/18 4097      PT LONG TERM GOAL #1   Title  Patient to improve functional strength by 1 MMT grade on all tested planes in order to reduce pain and improve tolerance to activity     Baseline  7/11- flowsheets     Time  6    Period  Weeks    Status  On-going      PT LONG TERM GOAL #2   Title  Patient to report pain as being no more than 2/10 L ankle and knee in order to improve QOL and activity tolerance     Baseline  7/11- 2/10 this morning, at worst 9/10     Time  6    Period  Weeks    Status  On-going      PT LONG TERM GOAL #3   Title  Patient to be able to ambulate at least 1011f without assisstive device without pain and with no loss of balance in order to show improved gait and balance skills     Baseline  7/11- 1570f    Time  6    Period  Weeks    Status  On-going      PT LONG TERM GOAL #4   Title  Patient to demonstrate improved ankle stability and balance as demonstrated by ability to maintain SLS for at least 30 seconds on solid surface     Baseline  7/11- DNT     Time  6    Period  Weeks    Status  On-going  Plan - 01/31/18 0952    Clinical Impression Statement  Pain impoved today.  She had an episode since last visit where she was weak and numb whole left side.  She was checked out in the ER.  She plans to have her medications checked.  FOTO score shows a decline in function 37 %.  Strengthening focus today fo knee and ankle.  No pain increase.  Close SBA to CGA for standing exercise.  She chose to wear a mask today for her allergies.     PT Next Visit Plan  She will need a progress note next visit. .  Continue gross strengthening program, increase focus on ankle,  standing calf stretch.  POC ends around 02/10/2018).   May need a BERG.    PT Home Exercise Plan  Eval: red TB LAQs and hamstring curls, ankle circles L ,  ankle towel inversion/eversion, towel DF stretch, toe scrunches, SLR, supine clam green band, hamstring stretch supine with  towel, supine 4 way SLR,  bridge.    Consulted and Agree with Plan of Care  Patient       Patient will benefit from skilled therapeutic intervention in order to improve the following deficits and impairments:     Visit Diagnosis: Pain in left ankle and joints of left foot  Chronic pain of left knee  Muscle weakness (generalized)  Difficulty in walking, not elsewhere classified     Problem List Patient Active Problem List   Diagnosis Date Noted  . Morbid obesity due to excess calories ([place]) complicated by hbp/ dm / hyperlipidemia 09/10/2017  . Cough variant asthma vs UACS/vcd on ACEi  09/09/2017  . Anxiety and depression 09/06/2017  . Mild persistent asthma without complication 82/50/5397  . Paranoia ([place]) 09/06/2017  . Torn left ear lobe 11/20/2016  . Chronic migraine w/o aura w/o status migrainosus, not intractable 06/27/2016  . Post-menopausal atrophic vaginitis 11/14/2015  . Primary osteoarthritis of both knees 07/18/2015  . De Quervain's tenosynovitis, right 05/08/2015  . GERD (gastroesophageal reflux disease) 05/08/2015  . Renal cyst, right 05/08/2015  . Adenomatous polyp of colon 03/26/2015  . Essential hypertension 02/12/2015  . Cognitive deficit due to old head trauma 02/12/2015  . Asthma, chronic 02/12/2015  . Chronic left shoulder pain 02/11/2015  . Thyroid nodule 12/18/2014  . Chronic neck pain   . HLD (hyperlipidemia)   . OSA treated with BiPAP 11/27/2014  . Obesity hypoventilation syndrome ([place]) 11/27/2014  . Demand ischemia ([place])   . Diabetes type 2, uncontrolled ([place])   . Cardiomegaly 11/25/2014  . CHF (congestive heart failure) ([place]) 11/25/2014  . 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  Patient goals were partially met. Patient is being discharged due to not returning since the last visit.  ?????          Kristoffer Leamon PT, DPT, LAT, ATC  03/29/18  3:23 PM

## 2018-02-03 ENCOUNTER — Ambulatory Visit: Payer: Medicare Other | Admitting: Physical Therapy

## 2018-02-04 ENCOUNTER — Ambulatory Visit (INDEPENDENT_AMBULATORY_CARE_PROVIDER_SITE_OTHER): Payer: Medicare Other

## 2018-02-04 ENCOUNTER — Encounter (HOSPITAL_COMMUNITY): Payer: Self-pay | Admitting: Emergency Medicine

## 2018-02-04 ENCOUNTER — Ambulatory Visit (HOSPITAL_COMMUNITY)
Admission: EM | Admit: 2018-02-04 | Discharge: 2018-02-04 | Disposition: A | Discharge status: Home / Self Care | Payer: Medicare Other | Attending: Internal Medicine | Admitting: Internal Medicine

## 2018-02-04 DIAGNOSIS — J069 Acute upper respiratory infection, unspecified: Secondary | ICD-10-CM

## 2018-02-04 DIAGNOSIS — B9789 Other viral agents as the cause of diseases classified elsewhere: Secondary | ICD-10-CM

## 2018-02-04 DIAGNOSIS — J4541 Moderate persistent asthma with (acute) exacerbation: Secondary | ICD-10-CM | POA: Diagnosis not present

## 2018-02-04 DIAGNOSIS — R05 Cough: Secondary | ICD-10-CM | POA: Diagnosis not present

## 2018-02-04 MED ORDER — AZITHROMYCIN 250 MG PO TABS: ORAL_TABLET | ORAL | 0 refills | Status: AC

## 2018-02-04 MED ORDER — PREDNISONE 50 MG PO TABS: 50.0000 mg | ORAL_TABLET | Freq: Every day | ORAL | 0 refills | Status: AC

## 2018-02-04 NOTE — ED Triage Notes (Signed)
Pt c/o a lot of congestion, cough, states shes sore from coughing.

## 2018-02-04 NOTE — Discharge Instructions (Signed)
Push fluids to ensure adequate hydration and keep secretions thin.  Tylenol and/or ibuprofen as needed for pain or fevers.  May continue with over the counter treatments as needed for symptoms.  Continue with use of inhaler as needed for wheezing. Three days of prednisone. May start antibiotics on Monday if no improvement or if worsening of symptoms as likely viral in nature at this time. Please follow up with your primary care provider next week for recheck.  If develop increased pain, shortness of breath, increased work of breathing or otherwise worsening please return or go to the Er.

## 2018-02-04 NOTE — ED Provider Notes (Signed)
[place]    CSN: [id number] Arrival date & time: 02/04/18  1004     History   Chief Complaint Chief Complaint  Patient presents with  . Cough    HPI Heidi [name] is a 67 y.o. female.   Heidi [name] presents with complaints of productive cough as well as nasal congestion which has been ongoing for the past 2-3 days. Has had wheezing and some shortness of breath. No chest pain. Her abdomen feels sore from cough. Worse at night. No know fevers. States she takes tylenol #3 for chronic back pain. Has been takign robitussin, mucinex, flonase nasal spray which have been helping some. No ear pain or sore throat. Uses singulair nightly. symbicort daily and albuterol inhaler prn. She feels her inhalers have not been helping. Has had wheezing. States she is concerned about the progression of pneumonia as she has had this in the past. Has noticed some slight increase in lower leg edema, no pain. Hx of anxiety, asthma, chf, DM, htn.     ROS per HPI.      Past Medical History:  Diagnosis Date  . Adenomatous colon polyp   . Anemia   . Anxiety   . Asthma   . CHF (congestive heart failure) ([place])   . Diabetes mellitus without complication ([place])   . High cholesterol   . Hypertension   . Left knee injury   . Pneumonia     Patient Active Problem List   Diagnosis Date Noted  . Morbid obesity due to excess calories ([place]) complicated by hbp/ dm / hyperlipidemia 09/10/2017  . Cough variant asthma vs UACS/vcd on ACEi  09/09/2017  . Anxiety and depression 09/06/2017  . Mild persistent asthma without complication 63/87/5643  . Paranoia ([place]) 09/06/2017  . Torn left ear lobe 11/20/2016  . Chronic migraine w/o aura w/o status migrainosus, not intractable 06/27/2016  . Post-menopausal atrophic vaginitis 11/14/2015  . Primary osteoarthritis of both knees 07/18/2015  . De Quervain's tenosynovitis, right 05/08/2015  . GERD (gastroesophageal reflux disease) 05/08/2015  . Renal cyst, right  05/08/2015  . Adenomatous polyp of colon 03/26/2015  . Essential hypertension 02/12/2015  . Cognitive deficit due to old head trauma 02/12/2015  . Asthma, chronic 02/12/2015  . Chronic left shoulder pain 02/11/2015  . Thyroid nodule 12/18/2014  . Chronic neck pain   . HLD (hyperlipidemia)   . OSA treated with BiPAP 11/27/2014  . Obesity hypoventilation syndrome ([place]) 11/27/2014  . Demand ischemia ([place])   . Diabetes type 2, uncontrolled ([place]   . Cardiomegaly 11/25/2014  . CHF (congestive heart failure) ([place]) 11/25/2014  . 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 ABDOMINAL HYSTERECTOMY    . CATARACT EXTRACTION Left   . CATARACT EXTRACTION     LT 01/2017, 02/2017 on RT  . CESAREAN SECTION      OB History   None      Home Medications    Prior to Admission medications   Medication Sig Start Date End Date Taking? Authorizing Provider  Acetaminophen-Codeine 300-30 MG tablet Take 1 tablet by mouth 2 (two) times daily. 01/04/18   Kirsteins, Luanna Salk, MD  albuterol (PROVENTIL HFA;VENTOLIN HFA) 108 (90 Base) MCG/ACT inhaler INHALE 2 PUFFS INTO THE LUNGS EVERY 6 HOURS AS NEEDED FOR WHEEZING OR SHORTNESS OF BREATH 09/06/17   Ladell Pier, MD  albuterol (PROVENTIL) (2.5 MG/3ML) 0.083% nebulizer solution INHALE CONTENTS OF 1 VIAL VIA  NEBULIZER EVERY 6 HOURS AS NEEDED FOR WHEEZING OR SHORTNESS OF BREATH 10/07/17   Ladell Pier, MD  Ascorbic Acid (VITAMIN C PO) Take 1 tablet by mouth daily.    [provider]  aspirin 81 MG chewable tablet Chew 81 mg by mouth daily.     [provider]  atenolol (TENORMIN) 25 MG tablet Take 1 tablet (25 mg total) by mouth daily. 02/26/17   Ladell Pier, MD  atorvastatin (LIPITOR) 40 MG tablet Take 1 tablet by mouth daily at 6pm 01/27/18   Ladell Pier, MD  azithromycin (ZITHROMAX) 250 MG tablet Take 2 tablets (500 mg total) by mouth daily for 1 day, THEN 1 tablet  (250 mg total) daily for 4 days. 02/07/18 02/12/18  Zigmund Gottron, NP  bumetanide (BUMEX) 0.5 MG tablet Take 1 tablet (0.5 mg total) by mouth daily. 02/26/17   Ladell Pier, MD  calcium-vitamin D (OSCAL WITH D) 500-200 MG-UNIT tablet Take 1 tablet by mouth daily. 06/04/17   Ladell Pier, MD  cetirizine (ZYRTEC) 10 MG tablet Take 1 tablet (10 mg total) by mouth daily. 12/21/17   Zigmund Gottron, NP  conjugated estrogens (PREMARIN) vaginal cream INSERT 0.5 GRAM VAGINALLY 1 TIME A WEEK 12/31/17   Ladell Pier, MD  diclofenac sodium (VOLTAREN) 1 % GEL Apply 2 g topically 4 (four) times daily. 08/23/17   Ladell Pier, MD  fluticasone (FLONASE) 50 MCG/ACT nasal spray Place 1 spray into both nostrils daily. 12/21/17   Augusto Gamble B, NP  glucose blood (FREESTYLE TEST STRIPS) test strip Use as instructed for 3 times daily testing of blood glucose. 05/31/17   Ladell Pier, MD  hydrochlorothiazide (HYDRODIURIL) 25 MG tablet Take 1 tablet (25 mg total) by mouth daily. 02/26/17   Ladell Pier, MD  insulin lispro protamine-lispro (HUMALOG 75/25 MIX) (75-25) 100 UNIT/ML SUSP injection Inject subcutaneously 80 U in the morning with food and 55 U at in evening with meals 09/06/17   Ladell Pier, MD  INSULIN SYRINGE 1CC/29G 29G X 1/2" 1 ML MISC USE THREE TIMES DAILY AS DIRECTED 01/31/18   Ladell Pier, MD  Lancets (FREESTYLE) lancets Use as directed 3 times daily. E11.9 06/08/17   Ladell Pier, MD  losartan (COZAAR) 50 MG tablet Take 1 tablet (50 mg total) by mouth daily. 09/09/17   Tanda Rockers, MD  montelukast (SINGULAIR) 10 MG tablet TAKE 1 TABLET(10 MG) BY MOUTH DAILY 12/01/17   Ladell Pier, MD  Multiple Vitamin (MULTIVITAMIN WITH MINERALS) TABS tablet Take 1 tablet by mouth daily.    [provider]  nitroGLYCERIN (NITROSTAT) 0.3 MG SL tablet Place 1 tablet (0.3 mg total) under the tongue every 5 (five) minutes as needed for chest pain. Max 3 tablets  in 15 minutes 08/07/16   Funches, Adriana Mccallum, MD  nizatidine (AXID) 150 MG capsule Take 1 capsule (150 mg total) by mouth at bedtime. 12/07/17   Tanda Rockers, MD  pantoprazole (PROTONIX) 40 MG tablet TAKE 1 TABLET(40 MG) BY MOUTH DAILY 30 TO 60 MINUTES BEFORE FIRST MEAL OF THE DAY 12/06/17   Tanda Rockers, MD  Potassium Chloride ER 20 MEQ TBCR Take 20 mEq by mouth daily. 01/28/18   Charlott Rakes, MD  predniSONE (DELTASONE) 50 MG tablet Take 1 tablet (50 mg total) by mouth daily with breakfast for 3 days. 02/04/18 02/07/18  Augusto Gamble B, NP  topiramate (TOPAMAX) 25 MG capsule TAKE 3 CAPSULES(75 MG) BY  MOUTH TWICE DAILY 01/28/18   Melvenia Beam, MD    Family History Family History  Problem Relation Age of Onset  . Colon cancer Father   . Diabetes type II Sister   . Diabetes type II Brother   . Colon cancer Sister   . Migraines Neg Hx     Social History Social History   Tobacco Use  . Smoking status: Never Smoker  . Smokeless tobacco: Never Used  Substance Use Topics  . Alcohol use: No  . Drug use: No     Allergies   Patient has no known allergies.   Review of Systems Review of Systems   Physical Exam Triage Vital Signs ED Triage Vitals [02/04/18 1029]  Enc Vitals Group     BP (!) 119/57     Pulse Rate 77     Resp 18     Temp (!) 97.4 F (36.3 C)     Temp src      SpO2 95 %     Weight      Height      Head Circumference      Peak Flow      Pain Score      Pain Loc      Pain Edu?      Excl. in [place]?    No data found.  Updated Vital Signs BP (!) 119/57   Pulse 77   Temp (!) 97.4 F (36.3 C)   Resp 18   SpO2 95%    Physical Exam  Constitutional: She is oriented to person, place, and time. She appears well-developed and well-nourished. No distress.  HENT:  Head: Normocephalic and atraumatic.  Right Ear: Tympanic membrane, external ear and ear canal normal.  Left Ear: Tympanic membrane, external ear and ear canal normal.  Nose: Nose normal.    Mouth/Throat: Uvula is midline, oropharynx is clear and moist and mucous membranes are normal. No tonsillar exudate.  Eyes: Pupils are equal, round, and reactive to light. Conjunctivae and EOM are normal.  Cardiovascular: Normal rate, regular rhythm and normal heart sounds.  Pulmonary/Chest: Effort normal. She has wheezes.  No cough throughout exam. Patient tolerates long winded conversation without difficulty without any indication of shortness of breath or difficulty catching breath  Musculoskeletal:  Bilateral lower extremities with +1 edema; no redness, non tender   Neurological: She is alert and oriented to person, place, and time.  Skin: Skin is warm and dry.     UC Treatments / Results  Labs (all labs ordered are listed, but only abnormal results are displayed) Labs Reviewed - No data to display  EKG None  Radiology Dg Chest 2 View  Result Date: 02/04/2018 CLINICAL DATA:  67 year old female with productive cough for 2 days. EXAM: CHEST - 2 VIEW COMPARISON:  Chest radiographs 08/24/2017 and earlier. FINDINGS: Larger lung volumes today. Chronic mild cardiomegaly. Other mediastinal contours remain within normal limits. Visualized tracheal air column is within normal limits. No pneumothorax, pulmonary edema or pleural effusion. Basilar predominant increased interstitial markings in both lungs appear chronic and stable. Some superimposed more confluent chronic left lower lobe scarring is suspected. No acute pulmonary opacity. No acute osseous abnormality identified. Negative visible bowel gas pattern. IMPRESSION: 1.  No acute cardiopulmonary abnormality. 2. Chronic basilar predominant increased pulmonary interstitial opacity and mild cardiomegaly. Electronically Signed   By: Genevie Ann M.D.   On: 02/04/2018 12:01    Procedures Procedures (including critical care time)  Medications Ordered in UC Medications -  No data to display  Initial Impression / Assessment and Plan / UC Course  I  have reviewed the triage vital signs and the nursing notes.  Pertinent labs & imaging results that were available during my care of the patient were reviewed by me and considered in my medical decision making (see chart for details).     Non toxic. Afebrile. No tachycardia. Some lower extremity edema noted. Wheezing noted on auscultation. No acute changes to chest xray. Patient feels strongly that she needs antibiotic as she is concerned about pneumonia. Remains likely viral at this time. Discussed this with patient. May start antibiotic 8/5 if no improvement or if worsening. Encouraged close follow up with PCP for recheck. Return precautions provided. Patient verbalized understanding and agreeable to plan.   Final Clinical Impressions(s) / UC Diagnoses   Final diagnoses:  Viral URI with cough  Moderate persistent asthma with acute exacerbation     Discharge Instructions     Push fluids to ensure adequate hydration and keep secretions thin.  Tylenol and/or ibuprofen as needed for pain or fevers.  May continue with over the counter treatments as needed for symptoms.  Continue with use of inhaler as needed for wheezing. Three days of prednisone. May start antibiotics on Monday if no improvement or if worsening of symptoms as likely viral in nature at this time. Please follow up with your primary care provider next week for recheck.  If develop increased pain, shortness of breath, increased work of breathing or otherwise worsening please return or go to the Er.     ED Prescriptions    Medication Sig Dispense Auth. Provider   predniSONE (DELTASONE) 50 MG tablet Take 1 tablet (50 mg total) by mouth daily with breakfast for 3 days. 3 tablet Augusto Gamble B, NP   azithromycin (ZITHROMAX) 250 MG tablet Take 2 tablets (500 mg total) by mouth daily for 1 day, THEN 1 tablet (250 mg total) daily for 4 days. 6 tablet Zigmund Gottron, NP     Controlled Substance Prescriptions [place] Controlled  Substance Registry consulted? Not Applicable   Zigmund Gottron, NP 02/04/18 1212

## 2018-02-07 ENCOUNTER — Ambulatory Visit: Payer: Medicare Other | Admitting: Physical Therapy

## 2018-02-09 DIAGNOSIS — E113511 Type 2 diabetes mellitus with proliferative diabetic retinopathy with macular edema, right eye: Secondary | ICD-10-CM | POA: Diagnosis not present

## 2018-02-09 DIAGNOSIS — H43813 Vitreous degeneration, bilateral: Secondary | ICD-10-CM | POA: Diagnosis not present

## 2018-02-10 ENCOUNTER — Ambulatory Visit: Payer: Medicare Other | Admitting: Physical Therapy

## 2018-02-14 ENCOUNTER — Ambulatory Visit: Payer: Medicare Other

## 2018-02-18 ENCOUNTER — Other Ambulatory Visit: Payer: Self-pay | Admitting: Internal Medicine

## 2018-02-18 ENCOUNTER — Ambulatory Visit: Payer: Medicare Other

## 2018-02-18 DIAGNOSIS — E1165 Type 2 diabetes mellitus with hyperglycemia: Secondary | ICD-10-CM

## 2018-02-18 DIAGNOSIS — IMO0002 Reserved for concepts with insufficient information to code with codable children: Secondary | ICD-10-CM

## 2018-02-19 ENCOUNTER — Other Ambulatory Visit: Payer: Self-pay | Admitting: Internal Medicine

## 2018-02-19 ENCOUNTER — Other Ambulatory Visit: Payer: Self-pay | Admitting: Neurology

## 2018-02-19 DIAGNOSIS — E1165 Type 2 diabetes mellitus with hyperglycemia: Secondary | ICD-10-CM

## 2018-02-19 DIAGNOSIS — IMO0001 Reserved for inherently not codable concepts without codable children: Secondary | ICD-10-CM

## 2018-02-19 DIAGNOSIS — J4530 Mild persistent asthma, uncomplicated: Secondary | ICD-10-CM

## 2018-02-21 ENCOUNTER — Other Ambulatory Visit: Payer: Self-pay | Admitting: Neurology

## 2018-02-21 ENCOUNTER — Other Ambulatory Visit: Payer: Self-pay | Admitting: *Deleted

## 2018-03-01 ENCOUNTER — Ambulatory Visit: Payer: Medicare Other

## 2018-03-16 DIAGNOSIS — E113511 Type 2 diabetes mellitus with proliferative diabetic retinopathy with macular edema, right eye: Secondary | ICD-10-CM | POA: Diagnosis not present

## 2018-03-17 ENCOUNTER — Ambulatory Visit
Admission: RE | Admit: 2018-03-17 | Discharge: 2018-03-17 | Disposition: A | Discharge status: Home / Self Care | Payer: Medicare Other | Source: Ambulatory Visit | Attending: Internal Medicine | Admitting: Internal Medicine

## 2018-03-17 DIAGNOSIS — Z1231 Encounter for screening mammogram for malignant neoplasm of breast: Secondary | ICD-10-CM

## 2018-03-20 ENCOUNTER — Other Ambulatory Visit: Payer: Self-pay | Admitting: Internal Medicine

## 2018-03-22 ENCOUNTER — Ambulatory Visit (INDEPENDENT_AMBULATORY_CARE_PROVIDER_SITE_OTHER): Payer: Medicare Other | Admitting: Neurology

## 2018-03-22 ENCOUNTER — Telehealth: Payer: Self-pay | Admitting: Neurology

## 2018-03-22 DIAGNOSIS — G43709 Chronic migraine without aura, not intractable, without status migrainosus: Secondary | ICD-10-CM

## 2018-03-22 NOTE — Progress Notes (Signed)
Botox- 100 units x 2 vials Lot: [id number] Expiration: 09/2020 NDC: 5146-0479-98  Bacteriostatic 0.9% Sodium Chloride- 25mL total Lot: [id number] Expiration: 04/06/2019 NDC: 2761-8485-92  Dx: N63.943 B/B

## 2018-03-22 NOTE — Progress Notes (Signed)
Interval history: Patient with chronic intractable migraines. Doing exceptionally well on Botox for Migraines. Prior tried multiple classes of medications which did not help with headaches or migraines until she started Botox. Since starting botox she has had  >75% improvement in frequency and severity. The migraines she does have are sparse are easier to treat and >50% less severe.Nothing has worked in the past except botox, I recommend life-long treatment with botox for migraines as well as Topiramate long-term for chronic intractable headaches only ever controlled with Botox an dTopiramate.   NO temples, no masseters, no LS, no eyes  Consent Form Botulism Toxin Injection For Chronic Migraine  Botulism toxin has been approved by the Federal drug administration for treatment of chronic migraine. Botulism toxin does not cure chronic migraine and it may not be effective in some patients.  The administration of botulism toxin is accomplished by injecting a small amount of toxin into the muscles of the neck and head. Dosage must be titrated for each individual. Any benefits resulting from botulism toxin tend to wear off after 3 months with a repeat injection required if benefit is to be maintained. Injections are usually done every 3-4 months with maximum effect peak achieved by about 2 or 3 weeks. Botulism toxin is expensive and you should be sure of what costs you will incur resulting from the injection.  The side effects of botulism toxin use for chronic migraine may include:   -Transient, and usually mild, facial weakness with facial injections  -Transient, and usually mild, head or neck weakness with head/neck injections  -Reduction or loss of forehead facial animation due to forehead muscle              weakness  -Eyelid drooping  -Dry eye  -Pain at the site of injection or bruising at the site of injection  -Double vision  -Potential unknown long term risks  Contraindications: You  should not have Botox if you are pregnant, nursing, allergic to albumin, have an infection, skin condition, or muscle weakness at the site of the injection, or have myasthenia gravis, Lambert-Eaton syndrome, or ALS.  It is also possible that as with any injection, there may be an allergic reaction or no effect from the medication. Reduced effectiveness after repeated injections is sometimes seen and rarely infection at the injection site may occur. All care will be taken to prevent these side effects. If therapy is given over a long time, atrophy and wasting in the muscle injected may occur. Occasionally the patient's become refractory to treatment because they develop antibodies to the toxin. In this event, therapy needs to be modified.  I have read the above information and consent to the administration of botulism toxin.    ______________  _____   _________________  Patient signature     Date   Witness signature       BOTOX PROCEDURE NOTE FOR MIGRAINE HEADACHE    Contraindications and precautions discussed with patient(above). Aseptic procedure was observed and patient tolerated procedure. Procedure performed by Dr. Georgia Dom  The condition has existed for more than 6 months, and pt does not have a diagnosis of ALS, Myasthenia Gravis or Lambert-Eaton Syndrome. Risks and benefits of injections discussed and pt agrees to proceed with the procedure. Written consent obtained  These injections are medically necessary. He receives good benefits from these injections. These injections do not cause sedations or hallucinations which the oral therapies may cause.  Indication/Diagnosis: chronic migraine BOTOX(J0585) injection was performed according  to protocol by [employer]. 200 units of BOTOX was dissolved into 4 cc NS.  NDC: [address]-40  Description of procedure:  The patient was placed in a sitting position. The standard protocol was used for Botox as follows, with 5 units of Botox  injected at each site:   -Procerus muscle, midline injection  -Corrugator muscle, bilateral injection  -Frontalis muscle, bilateral injection, with 2 sites each side, medial injection was performed in the upper one third of the frontalis muscle, in the region vertical from the medial inferior edge of the superior orbital rim. The lateral injection was again in the upper one third of the forehead vertically above the lateral limbus of the cornea, 1.5 cm lateral to the medial injection site.  -Temporalis muscle injection, 4 sites, bilaterally. The first injection was 3 cm above the tragus of the ear, second injection site was 1.5 cm to 3 cm up from the first injection site in line with the tragus of the ear. The third injection site was 1.5-3 cm forward between the first 2 injection sites. The fourth injection site was 1.5 cm posterior to the second injection site.  -Occipitalis muscle injection, 3 sites, bilaterally. The first injection was done one half way between the occipital protuberance and the tip of the mastoid process behind the ear. The second injection site was done lateral and superior to the first, 1 fingerbreadth from the first injection. The third injection site was 1 fingerbreadth superiorly and medially from the first injection site.  -Cervical paraspinal muscle injection, 2 sites, bilateral knee first injection site was 1 cm from the midline of the cervical spine, 3 cm inferior to the lower border of the occipital protuberance. The second injection site was 1.5 cm superiorly and laterally to the first injection site.  -Trapezius muscle injection was performed at 3 sites, bilaterally. The first injection site was in the upper trapezius muscle halfway between the inflection point of the neck, and the acromion. The second injection site was one half way between the acromion and the first injection site. The third injection was done between the first injection site and the inflection  point of the neck.   Will return for repeat injection in 3 months.   A 200 unit sof Botox was used, 155 units were injected, the rest of the Botox was wasted. The patient tolerated the procedure well, there were no complications of the above procedure.

## 2018-03-22 NOTE — Telephone Encounter (Signed)
12 wk botox inj °

## 2018-03-23 NOTE — Telephone Encounter (Signed)
I called to schedule the patient but she did not answer. I left a VM asking her to call me back.  °

## 2018-03-25 DIAGNOSIS — M5030 Other cervical disc degeneration, unspecified cervical region: Secondary | ICD-10-CM | POA: Diagnosis not present

## 2018-03-25 DIAGNOSIS — G8929 Other chronic pain: Secondary | ICD-10-CM | POA: Diagnosis not present

## 2018-04-03 ENCOUNTER — Other Ambulatory Visit: Payer: Self-pay | Admitting: Internal Medicine

## 2018-04-03 DIAGNOSIS — I10 Essential (primary) hypertension: Secondary | ICD-10-CM

## 2018-04-08 DIAGNOSIS — Z23 Encounter for immunization: Secondary | ICD-10-CM | POA: Diagnosis not present

## 2018-04-11 ENCOUNTER — Other Ambulatory Visit: Payer: Self-pay

## 2018-04-11 ENCOUNTER — Other Ambulatory Visit: Payer: Self-pay | Admitting: Internal Medicine

## 2018-04-11 DIAGNOSIS — I5033 Acute on chronic diastolic (congestive) heart failure: Secondary | ICD-10-CM

## 2018-04-11 DIAGNOSIS — I10 Essential (primary) hypertension: Secondary | ICD-10-CM

## 2018-04-11 MED ORDER — POTASSIUM CHLORIDE ER 20 MEQ PO TBCR: 20.0000 meq | EXTENDED_RELEASE_TABLET | Freq: Every day | ORAL | 0 refills | Status: DC

## 2018-04-12 ENCOUNTER — Other Ambulatory Visit: Payer: Self-pay | Admitting: Internal Medicine

## 2018-04-12 DIAGNOSIS — J4530 Mild persistent asthma, uncomplicated: Secondary | ICD-10-CM

## 2018-04-19 ENCOUNTER — Other Ambulatory Visit: Payer: Self-pay | Admitting: Internal Medicine

## 2018-04-19 DIAGNOSIS — IMO0002 Reserved for concepts with insufficient information to code with codable children: Secondary | ICD-10-CM

## 2018-04-19 DIAGNOSIS — E1165 Type 2 diabetes mellitus with hyperglycemia: Secondary | ICD-10-CM

## 2018-04-19 DIAGNOSIS — I10 Essential (primary) hypertension: Secondary | ICD-10-CM

## 2018-04-29 ENCOUNTER — Other Ambulatory Visit: Payer: Self-pay | Admitting: Internal Medicine

## 2018-04-29 DIAGNOSIS — I10 Essential (primary) hypertension: Secondary | ICD-10-CM

## 2018-05-02 DIAGNOSIS — G894 Chronic pain syndrome: Secondary | ICD-10-CM | POA: Diagnosis not present

## 2018-05-02 DIAGNOSIS — M542 Cervicalgia: Secondary | ICD-10-CM | POA: Diagnosis not present

## 2018-05-02 DIAGNOSIS — M5030 Other cervical disc degeneration, unspecified cervical region: Secondary | ICD-10-CM | POA: Diagnosis not present

## 2018-05-02 DIAGNOSIS — G8929 Other chronic pain: Secondary | ICD-10-CM | POA: Diagnosis not present

## 2018-05-03 ENCOUNTER — Ambulatory Visit: Payer: Medicare Other | Attending: Internal Medicine | Admitting: Internal Medicine

## 2018-05-03 ENCOUNTER — Encounter: Payer: Self-pay | Admitting: Internal Medicine

## 2018-05-03 VITALS — BP 113/70 | HR 79 | Temp 97.7°F | Ht 64.0 in | Wt 230.2 lb

## 2018-05-03 DIAGNOSIS — Z79899 Other long term (current) drug therapy: Secondary | ICD-10-CM | POA: Insufficient documentation

## 2018-05-03 DIAGNOSIS — M5030 Other cervical disc degeneration, unspecified cervical region: Secondary | ICD-10-CM

## 2018-05-03 DIAGNOSIS — Z794 Long term (current) use of insulin: Secondary | ICD-10-CM

## 2018-05-03 DIAGNOSIS — M25512 Pain in left shoulder: Secondary | ICD-10-CM | POA: Insufficient documentation

## 2018-05-03 DIAGNOSIS — I130 Hypertensive heart and chronic kidney disease with heart failure and stage 1 through stage 4 chronic kidney disease, or unspecified chronic kidney disease: Secondary | ICD-10-CM | POA: Diagnosis not present

## 2018-05-03 DIAGNOSIS — R05 Cough: Secondary | ICD-10-CM

## 2018-05-03 DIAGNOSIS — G43909 Migraine, unspecified, not intractable, without status migrainosus: Secondary | ICD-10-CM | POA: Insufficient documentation

## 2018-05-03 DIAGNOSIS — Z7951 Long term (current) use of inhaled steroids: Secondary | ICD-10-CM | POA: Insufficient documentation

## 2018-05-03 DIAGNOSIS — N183 Chronic kidney disease, stage 3 unspecified: Secondary | ICD-10-CM

## 2018-05-03 DIAGNOSIS — F419 Anxiety disorder, unspecified: Secondary | ICD-10-CM | POA: Insufficient documentation

## 2018-05-03 DIAGNOSIS — Z7982 Long term (current) use of aspirin: Secondary | ICD-10-CM | POA: Diagnosis not present

## 2018-05-03 DIAGNOSIS — E1165 Type 2 diabetes mellitus with hyperglycemia: Secondary | ICD-10-CM

## 2018-05-03 DIAGNOSIS — G4733 Obstructive sleep apnea (adult) (pediatric): Secondary | ICD-10-CM | POA: Insufficient documentation

## 2018-05-03 DIAGNOSIS — M170 Bilateral primary osteoarthritis of knee: Secondary | ICD-10-CM | POA: Insufficient documentation

## 2018-05-03 DIAGNOSIS — I10 Essential (primary) hypertension: Secondary | ICD-10-CM | POA: Diagnosis not present

## 2018-05-03 DIAGNOSIS — F329 Major depressive disorder, single episode, unspecified: Secondary | ICD-10-CM | POA: Insufficient documentation

## 2018-05-03 DIAGNOSIS — E785 Hyperlipidemia, unspecified: Secondary | ICD-10-CM | POA: Diagnosis not present

## 2018-05-03 DIAGNOSIS — Z833 Family history of diabetes mellitus: Secondary | ICD-10-CM | POA: Insufficient documentation

## 2018-05-03 DIAGNOSIS — E875 Hyperkalemia: Secondary | ICD-10-CM

## 2018-05-03 DIAGNOSIS — E041 Nontoxic single thyroid nodule: Secondary | ICD-10-CM | POA: Diagnosis not present

## 2018-05-03 DIAGNOSIS — R058 Other specified cough: Secondary | ICD-10-CM

## 2018-05-03 DIAGNOSIS — I509 Heart failure, unspecified: Secondary | ICD-10-CM | POA: Diagnosis not present

## 2018-05-03 DIAGNOSIS — E1122 Type 2 diabetes mellitus with diabetic chronic kidney disease: Secondary | ICD-10-CM | POA: Insufficient documentation

## 2018-05-03 DIAGNOSIS — K219 Gastro-esophageal reflux disease without esophagitis: Secondary | ICD-10-CM | POA: Insufficient documentation

## 2018-05-03 DIAGNOSIS — IMO0001 Reserved for inherently not codable concepts without codable children: Secondary | ICD-10-CM

## 2018-05-03 DIAGNOSIS — G8929 Other chronic pain: Secondary | ICD-10-CM | POA: Diagnosis not present

## 2018-05-03 DIAGNOSIS — Z6839 Body mass index (BMI) 39.0-39.9, adult: Secondary | ICD-10-CM | POA: Diagnosis not present

## 2018-05-03 DIAGNOSIS — J4530 Mild persistent asthma, uncomplicated: Secondary | ICD-10-CM | POA: Diagnosis not present

## 2018-05-03 LAB — POCT GLYCOSYLATED HEMOGLOBIN (HGB A1C): HbA1c, POC (controlled diabetic range): 10 % — AB (ref 0.0–7.0)

## 2018-05-03 LAB — GLUCOSE, POCT (MANUAL RESULT ENTRY): POC Glucose: 155 mg/dl — AB (ref 70–99)

## 2018-05-03 MED ORDER — HYDROCHLOROTHIAZIDE 25 MG PO TABS: 25.0000 mg | ORAL_TABLET | Freq: Every day | ORAL | 3 refills | Status: DC

## 2018-05-03 MED ORDER — POTASSIUM CHLORIDE ER 20 MEQ PO TBCR: 20.0000 meq | EXTENDED_RELEASE_TABLET | Freq: Every day | ORAL | 6 refills | Status: DC

## 2018-05-03 MED ORDER — MONTELUKAST SODIUM 10 MG PO TABS: ORAL_TABLET | ORAL | 1 refills | Status: DC

## 2018-05-03 MED ORDER — NIZATIDINE 150 MG PO CAPS: 150.0000 mg | ORAL_CAPSULE | Freq: Every day | ORAL | 5 refills | Status: DC

## 2018-05-03 MED ORDER — DAPAGLIFLOZIN PROPANEDIOL 5 MG PO TABS: 5.0000 mg | ORAL_TABLET | Freq: Every day | ORAL | 4 refills | Status: DC

## 2018-05-03 MED ORDER — ATORVASTATIN CALCIUM 40 MG PO TABS: ORAL_TABLET | ORAL | 1 refills | Status: DC

## 2018-05-03 NOTE — Patient Instructions (Addendum)
Stop Bumex and estrogen cream as discussed today.  Your diabetes is not well controlled.  Please keep a log of your blood sugars and bring them in on your next visit.  Start the medication called Wilder Glade once a day to help better control your diabetes.  Please return to the lab in 1 week to have blood work done as discussed today.

## 2018-05-03 NOTE — Progress Notes (Signed)
Patient ID: Heidi [name], female    DOB: 02-Feb-1951  MRN: [id number]  CC: Diabetes   Subjective: Heidi [name] is a 67 y.o. female who presents for chronic disease management Her concerns today include:  67 year old female with history of HTN, CHF, migraines([facility] on Botox inj), OSA (not  on BiPAP),upper airway cough syndrome, GERD, diabetes2, CKD stage 3, OA knees,DDD of cervical spine (followed by Dr. Letta Pate and on Tyl#3)obesity, HL,  DM:  Checking BS 2-4 x a day. Gives range 62-300. She does not have log with her.  Hypoglycemia is occasional.   Med:  Humalog 75/25 - taking 80 units in a.m and 55 in the evening Eating habits:  "I'm doing so much better with that." Exercise:  She was doing some P.T. [facility]  ?asthma: saw Dr. Melvyn Novas several mths ago.  Findings not suggestive of asthma.  Dx with upper airway cough syndrome.  Axid prescribed to take at nights.  She is on a PPI that she takes in the morning.Marland Kitchen  Pt thinks she was told to continue Symbicort so she continues to use BID.  States that Dr. Melvyn Novas wants her to do allergy testing.  She has to make a follow-up appointment.  DDD c-spine:  Sees Dr. Read Drivers.  Stopped taking Tyl #3 regularly because it makes her feel high.  She states that she takes it only when her pain is severe.  HTN:  Compliant with Atenolol, Cozaar, HCTZ and Bumex.  She limits salt in the foods.  Denies any chest pains or shortness of breath at this time.  No lower extremity edema.  Patient Active Problem List   Diagnosis Date Noted  . Morbid obesity due to excess calories ([place]) complicated by hbp/ dm / hyperlipidemia 09/10/2017  . Cough variant asthma vs UACS/vcd on ACEi  09/09/2017  . Anxiety and depression 09/06/2017  . Mild persistent asthma without complication 38/04/1750  . Paranoia ([place]) 09/06/2017  . Torn left ear lobe 11/20/2016  . Chronic migraine w/o aura w/o status migrainosus, not  intractable 06/27/2016  . Post-menopausal atrophic vaginitis 11/14/2015  . Primary osteoarthritis of both knees 07/18/2015  . De Quervain's tenosynovitis, right 05/08/2015  . GERD (gastroesophageal reflux disease) 05/08/2015  . Renal cyst, right 05/08/2015  . Adenomatous polyp of colon 03/26/2015  . Essential hypertension 02/12/2015  . Cognitive deficit due to old head trauma 02/12/2015  . Asthma, chronic 02/12/2015  . Chronic left shoulder pain 02/11/2015  . Thyroid nodule 12/18/2014  . Chronic neck pain   . HLD (hyperlipidemia)   . OSA treated with BiPAP 11/27/2014  . Obesity hypoventilation syndrome ([place]) 11/27/2014  . Demand ischemia ([place])   . Diabetes type 2, uncontrolled ([place])   . Cardiomegaly 11/25/2014  . CHF (congestive heart failure) ([place]) 11/25/2014  . DM type 2 causing CKD stage 2 ([place]) 11/25/2014     Current Outpatient Medications on File Prior to Visit  Medication Sig Dispense Refill  . Acetaminophen-Codeine 300-30 MG tablet Take 1 tablet by mouth 2 (two) times daily. 60 tablet 0  . albuterol (PROVENTIL HFA;VENTOLIN HFA) 108 (90 Base) MCG/ACT inhaler INHALE 2 PUFFS INTO THE LUNGS EVERY 6 HOURS AS NEEDED FOR WHEEZING OR SHORTNESS OF BREATH 18 g 1  . albuterol (PROVENTIL) (2.5 MG/3ML) 0.083% nebulizer solution USE 1 VIAL VIA NEBULIZER EVERY 6 HOURS AS NEEDED FOR WHEEZING OR SHORTNESS OF BREATH 25 vial 0  . Ascorbic Acid (VITAMIN C PO) Take 1  tablet by mouth daily.    Marland Kitchen aspirin 81 MG chewable tablet Chew 81 mg by mouth daily.     Marland Kitchen atenolol (TENORMIN) 25 MG tablet Take 1 tablet (25 mg total) by mouth daily. 90 tablet 6  . calcium-vitamin D (OSCAL WITH D) 500-200 MG-UNIT tablet Take 1 tablet by mouth daily. 30 tablet 3  . cetirizine (ZYRTEC) 10 MG tablet Take 1 tablet (10 mg total) by mouth daily. 30 tablet 0  . fluticasone (FLONASE) 50 MCG/ACT nasal spray Place 1 spray into both nostrils daily. 16 g 2  . glucose blood (FREESTYLE TEST STRIPS) test strip Use as instructed  for 3 times daily testing of blood glucose. 300 each 2  . guaiFENesin (MUCINEX PO) Take by mouth.    . insulin lispro protamine-lispro (HUMALOG 75/25 MIX) (75-25) 100 UNIT/ML SUSP injection Inject subcutaneously 80 U in the morning with food and 55 U at in evening with meals 40 mL 11  . INSULIN SYRINGE 1CC/29G 29G X 1/2" 1 ML MISC Use as directed three times daily. MUST MAKE APPT FOR FURTHER REFILLS 100 each 0  . Lancets (FREESTYLE) lancets TEST BLOOD SUGAR LEVELS THREE TIMES DAILY 100 each 0  . losartan (COZAAR) 50 MG tablet Take 1 tablet (50 mg total) by mouth daily. 30 tablet 11  . Multiple Vitamin (MULTIVITAMIN WITH MINERALS) TABS tablet Take 1 tablet by mouth daily.    . nitroGLYCERIN (NITROSTAT) 0.3 MG SL tablet Place 1 tablet (0.3 mg total) under the tongue every 5 (five) minutes as needed for chest pain. Max 3 tablets in 15 minutes 90 tablet 0  . pantoprazole (PROTONIX) 40 MG tablet TAKE 1 TABLET(40 MG) BY MOUTH DAILY 30 TO 60 MINUTES BEFORE FIRST MEAL OF THE DAY 30 tablet 5  . SYMBICORT 80-4.5 MCG/ACT inhaler INHALE 2 PUFFS INTO THE LUNGS TWICE DAILY 10.2 g 2  . topiramate (TOPAMAX) 25 MG capsule Take 3 capsules (75 mg total) by mouth 2 (two) times daily. Please call and schedule a regular office visit for follow-up. 180 capsule 1   No current facility-administered medications on file prior to visit.     No Known Allergies  Social History   Socioeconomic History  . Marital status: Legally Separated    Spouse name: Not on file  . Number of children: 4  . Years of education: Not on file  . Highest education level: Not on file  Occupational History  . Occupation: Disabled  Social Needs  . Financial resource strain: Not on file  . Food insecurity:    Worry: Not on file    Inability: Not on file  . Transportation needs:    Medical: Not on file    Non-medical: Not on file  Tobacco Use  . Smoking status: Never Smoker  . Smokeless tobacco: Never Used  Substance and Sexual  Activity  . Alcohol use: No  . Drug use: No  . Sexual activity: Not on file  Lifestyle  . Physical activity:    Days per week: Not on file    Minutes per session: Not on file  . Stress: Not on file  Relationships  . Social connections:    Talks on phone: Not on file    Gets together: Not on file    Attends religious service: Not on file    Active member of club or organization: Not on file    Attends meetings of clubs or organizations: Not on file    Relationship status: Not on file  .  Intimate partner violence:    Fear of current or ex partner: Not on file    Emotionally abused: Not on file    Physically abused: Not on file    Forced sexual activity: Not on file  Other Topics Concern  . Not on file  Social History Narrative   Lives with daughter   Caffeine use: 1 cup coffee per day    Family History  Problem Relation Age of Onset  . Colon cancer Father   . Diabetes type II Sister   . Diabetes type II Brother   . Colon cancer Sister   . Migraines Neg Hx   . Breast cancer Neg Hx     Past Surgical History:  Procedure Laterality Date  . ABDOMINAL HYSTERECTOMY    . CATARACT EXTRACTION Left   . CATARACT EXTRACTION     LT 01/2017, 02/2017 on RT  . CESAREAN SECTION      ROS: Review of Systems GU: We discussed whether she still needs to be on estrogen vaginal cream.  She states that she is hardly using it anymore and we can go ahead and discontinue that.  PHYSICAL EXAM: BP 113/70   Pulse 79   Temp 97.7 F (36.5 C) (Oral)   Ht 5\' 4"  (1.626 m)   Wt 230 lb 3.2 oz (104.4 kg)   SpO2 92%   BMI 39.51 kg/m   Physical Exam General appearance - alert, well appearing, and in no distress Mental status -on affect.  Tangential thoughts Neck - supple, no significant adenopathy Chest - clear to auscultation, no wheezes, rales or rhonchi, symmetric air entry Heart -soft systolic ejection murmur along left sternal border. Extremities -no lower extremity edema.   Lab Results   Component Value Date   WBC 8.4 01/27/2018   HGB 13.9 01/27/2018   HCT 41.0 01/27/2018   MCV 87.0 01/27/2018   PLT 217 01/27/2018     Chemistry      Component Value Date/Time   NA 141 01/27/2018 1913   NA 138 08/23/2017 1655   K 3.2 (L) 01/27/2018 1913   CL 103 01/27/2018 1913   CO2 26 01/27/2018 1854   BUN 20 01/27/2018 1913   BUN 16 08/23/2017 1655   CREATININE 1.30 (H) 01/27/2018 1913   CREATININE 1.14 (H) 05/12/2016 1648      Component Value Date/Time   CALCIUM 9.6 01/27/2018 1854   ALKPHOS 93 01/27/2018 1854   AST 26 01/27/2018 1854   ALT 31 01/27/2018 1854   BILITOT 0.5 01/27/2018 1854     Results for orders placed or performed in visit on 05/03/18  POCT glucose (manual entry)  Result Value Ref Range   POC Glucose 155 (A) 70 - 99 mg/dl  POCT glycosylated hemoglobin (Hb A1C)  Result Value Ref Range   Hemoglobin A1C     HbA1c POC (<> result, manual entry)     HbA1c, POC (prediabetic range)     HbA1c, POC (controlled diabetic range) 10.0 (A) 0.0 - 7.0 %     ASSESSMENT AND PLAN: 1. Uncontrolled type 2 diabetes mellitus without complication, with long-term current use of insulin ([place]) Not at goal.  Discussed the importance of healthy eating habits and regular exercise. Recommend adding low-dose Farxiga Continue to monitor blood sugars and bring in readings on follow-up visit in 2 months. - POCT glucose (manual entry) - POCT glycosylated hemoglobin (Hb A1C) - Microalbumin / creatinine urine ratio - dapagliflozin propanediol (FARXIGA) 5 MG TABS tablet; Take 5 mg by  mouth daily.  Dispense: 30 tablet; Refill: 4 - Comprehensive metabolic panel; Future  2. Essential hypertension On review of labs, it looks like last 2 chemistry revealed low potassium.  I recommend stopping Bumex.  Continue hydrochlorothiazide and potassium supplement.  Return to the lab in 1 week for repeat chemistry. - hydrochlorothiazide (HYDRODIURIL) 25 MG tablet; Take 1 tablet (25 mg total) by  mouth daily.  Dispense: 90 tablet; Refill: 3 - Potassium Chloride ER 20 MEQ TBCR; Take 20 mEq by mouth daily.  Dispense: 30 tablet; Refill: 6  3. Upper airway cough syndrome Patient will clarify with her pulmonologist whether she should continue with Symbicort. - nizatidine (AXID) 150 MG capsule; Take 1 capsule (150 mg total) by mouth at bedtime.  Dispense: 30 capsule; Refill: 5 - montelukast (SINGULAIR) 10 MG tablet; TAKE 1 TABLET(10 MG) BY MOUTH DAILY  Dispense: 90 tablet; Refill: 1  4. Hyperkalemia See #2 above  5. CKD (chronic kidney disease) stage 3, GFR 30-59 ml/min (H[facility]) This has remained stable on last 2 chemistries.  6. Degenerative disc disease, cervical Keep follow-up appointment with PMR  7. Hyperlipidemia, unspecified hyperlipidemia type - atorvastatin (LIPITOR) 40 MG tablet; Take 1 tablet by mouth daily at 6pm  Dispense: 90 tablet; Refill: 1 - Lipid panel; Future   Patient was given the opportunity to ask questions.  Patient verbalized understanding of the plan and was able to repeat key elements of the plan.   Orders Placed This Encounter  Procedures  . Microalbumin / creatinine urine ratio  . Lipid panel  . Comprehensive metabolic panel  . POCT glucose (manual entry)  . POCT glycosylated hemoglobin (Hb A1C)     Requested Prescriptions   Signed Prescriptions Disp Refills  . hydrochlorothiazide (HYDRODIURIL) 25 MG tablet 90 tablet 3    Sig: Take 1 tablet (25 mg total) by mouth daily.  . nizatidine (AXID) 150 MG capsule 30 capsule 5    Sig: Take 1 capsule (150 mg total) by mouth at bedtime.  . Potassium Chloride ER 20 MEQ TBCR 30 tablet 6    Sig: Take 20 mEq by mouth daily.  Marland Kitchen atorvastatin (LIPITOR) 40 MG tablet 90 tablet 1    Sig: Take 1 tablet by mouth daily at 6pm  . montelukast (SINGULAIR) 10 MG tablet 90 tablet 1    Sig: TAKE 1 TABLET(10 MG) BY MOUTH DAILY  . dapagliflozin propanediol (FARXIGA) 5 MG TABS tablet 30 tablet 4    Sig: Take 5 mg by mouth  daily.    Return in about 2 months (around 07/03/2018).  Karle Plumber, MD, FACP

## 2018-05-04 LAB — MICROALBUMIN / CREATININE URINE RATIO
Creatinine, Urine: 149.3 mg/dL
Microalb/Creat Ratio: 2.3 mg/g creat (ref 0.0–30.0)
Microalbumin, Urine: 3.5 ug/mL

## 2018-05-05 ENCOUNTER — Other Ambulatory Visit: Payer: Self-pay

## 2018-05-05 ENCOUNTER — Ambulatory Visit (HOSPITAL_COMMUNITY)
Admission: EM | Admit: 2018-05-05 | Discharge: 2018-05-05 | Disposition: A | Discharge status: Home / Self Care | Payer: Medicare Other | Attending: Family Medicine | Admitting: Family Medicine

## 2018-05-05 ENCOUNTER — Encounter (HOSPITAL_COMMUNITY): Payer: Self-pay | Admitting: *Deleted

## 2018-05-05 DIAGNOSIS — S00411A Abrasion of right ear, initial encounter: Secondary | ICD-10-CM | POA: Diagnosis not present

## 2018-05-05 MED ORDER — OFLOXACIN 0.3 % OT SOLN: 5.0000 [drp] | Freq: Every day | OTIC | 0 refills | Status: AC

## 2018-05-05 MED ORDER — FLUTICASONE PROPIONATE 50 MCG/ACT NA SUSP: 1.0000 | Freq: Every day | NASAL | 0 refills | Status: DC

## 2018-05-05 NOTE — Discharge Instructions (Signed)
Don't put anything further into the ear to allow this to heal.  Use of ear drops daily for the next week to prevent infection.  Use of daily nasal spray to help with fullness sensation.  If worsening or no improvement please follow up with your PCP or ENT.

## 2018-05-05 NOTE — ED Triage Notes (Signed)
C/o bleeding from right ear this am ,states she used a Q-tip in her ear today and noticed blood.

## 2018-05-05 NOTE — ED Provider Notes (Signed)
[facility]    CSN: [id number] Arrival date & time: 05/05/18  1502     History   Chief Complaint Chief Complaint  Patient presents with  . Otalgia    HPI Heidi [name] is a 67 y.o. female.   [name] presents with complaints of bleeding to right ear. She noticed this this morning. She states she used a hair pin to itch in her ear and to get wax out. She then followed this with a q-tip. She noted a scab. Noted blood to qtip. No ear pain. States she feels bilateral ear fullness. No fevers. No other URI symptoms. Hasn't used any medications for symptoms. Hx of anemia, anxiety, asthma, CHF, dm, htn.    ROS per HPI.      Past Medical History:  Diagnosis Date  . Adenomatous colon polyp   . Anemia   . Anxiety   . Asthma   . CHF (congestive heart failure) ([place])   . Diabetes mellitus without complication ([place])   . High cholesterol   . Hypertension   . Left knee injury   . Pneumonia     Patient Active Problem List   Diagnosis Date Noted  . Upper airway cough syndrome 05/03/2018  . Morbid obesity due to excess calories ([place]) complicated by hbp/ dm / hyperlipidemia 09/10/2017  . Cough variant asthma vs UACS/vcd on ACEi  09/09/2017  . Anxiety and depression 09/06/2017  . Mild persistent asthma without complication 81/82/9937  . Paranoia ([place]) 09/06/2017  . Torn left ear lobe 11/20/2016  . Chronic migraine w/o aura w/o status migrainosus, not intractable 06/27/2016  . Post-menopausal atrophic vaginitis 11/14/2015  . Primary osteoarthritis of both knees 07/18/2015  . GERD (gastroesophageal reflux disease) 05/08/2015  . Adenomatous polyp of colon 03/26/2015  . Essential hypertension 02/12/2015  . Cognitive deficit due to old head trauma 02/12/2015  . Asthma, chronic 02/12/2015  . Chronic left shoulder pain 02/11/2015  . Thyroid nodule 12/18/2014  . Chronic neck pain   . HLD (hyperlipidemia)   . OSA treated with BiPAP 11/27/2014  . Obesity hypoventilation  syndrome ([place]) 11/27/2014  . Demand ischemia ([place])   . Diabetes type 2, uncontrolled ([place])   . Cardiomegaly 11/25/2014  . CHF (congestive heart failure) ([place]) 11/25/2014  . DM type 2 causing CKD stage 2 ([place]) 11/25/2014    Past Surgical History:  Procedure Laterality Date  . ABDOMINAL HYSTERECTOMY    . CATARACT EXTRACTION Left   . CATARACT EXTRACTION     LT 01/2017, 02/2017 on RT  . CESAREAN SECTION      OB History   None      Home Medications    Prior to Admission medications   Medication Sig Start Date End Date Taking? Authorizing Provider  Acetaminophen-Codeine 300-30 MG tablet Take 1 tablet by mouth 2 (two) times daily. 01/04/18   Kirsteins, Luanna Salk, MD  albuterol (PROVENTIL HFA;VENTOLIN HFA) 108 (90 Base) MCG/ACT inhaler INHALE 2 PUFFS INTO THE LUNGS EVERY 6 HOURS AS NEEDED FOR WHEEZING OR SHORTNESS OF BREATH 09/06/17   Ladell Pier, MD  albuterol (PROVENTIL) (2.5 MG/3ML) 0.083% nebulizer solution USE 1 VIAL VIA NEBULIZER EVERY 6 HOURS AS NEEDED FOR WHEEZING OR SHORTNESS OF BREATH 02/21/18   Ladell Pier, MD  Ascorbic Acid (VITAMIN C PO) Take 1 tablet by mouth daily.    [provider]  aspirin 81 MG chewable tablet Chew 81 mg by mouth daily.     [provider]  atenolol (TENORMIN) 25 MG  tablet Take 1 tablet (25 mg total) by mouth daily. 02/26/17   Ladell Pier, MD  atorvastatin (LIPITOR) 40 MG tablet Take 1 tablet by mouth daily at 6pm 05/03/18   Ladell Pier, MD  calcium-vitamin D (OSCAL WITH D) 500-200 MG-UNIT tablet Take 1 tablet by mouth daily. 06/04/17   Ladell Pier, MD  cetirizine (ZYRTEC) 10 MG tablet Take 1 tablet (10 mg total) by mouth daily. 12/21/17   Zigmund Gottron, NP  dapagliflozin propanediol (FARXIGA) 5 MG TABS tablet Take 5 mg by mouth daily. 05/03/18   Ladell Pier, MD  fluticasone (FLONASE) 50 MCG/ACT nasal spray Place 1 spray into both nostrils daily. 05/05/18   Augusto Gamble B, NP  glucose blood  (FREESTYLE TEST STRIPS) test strip Use as instructed for 3 times daily testing of blood glucose. 05/31/17   Ladell Pier, MD  guaiFENesin (MUCINEX PO) Take by mouth.    [provider]  hydrochlorothiazide (HYDRODIURIL) 25 MG tablet Take 1 tablet (25 mg total) by mouth daily. 05/03/18   Ladell Pier, MD  insulin lispro protamine-lispro (HUMALOG 75/25 MIX) (75-25) 100 UNIT/ML SUSP injection Inject subcutaneously 80 U in the morning with food and 55 U at in evening with meals 09/06/17   Ladell Pier, MD  INSULIN SYRINGE 1CC/29G 29G X 1/2" 1 ML MISC Use as directed three times daily. MUST MAKE APPT FOR FURTHER REFILLS 04/19/18   Ladell Pier, MD  Lancets (FREESTYLE) lancets TEST BLOOD SUGAR LEVELS THREE TIMES DAILY 02/21/18   Ladell Pier, MD  losartan (COZAAR) 50 MG tablet Take 1 tablet (50 mg total) by mouth daily. 09/09/17   Tanda Rockers, MD  montelukast (SINGULAIR) 10 MG tablet TAKE 1 TABLET(10 MG) BY MOUTH DAILY 05/03/18   Ladell Pier, MD  Multiple Vitamin (MULTIVITAMIN WITH MINERALS) TABS tablet Take 1 tablet by mouth daily.    [provider]  nitroGLYCERIN (NITROSTAT) 0.3 MG SL tablet Place 1 tablet (0.3 mg total) under the tongue every 5 (five) minutes as needed for chest pain. Max 3 tablets in 15 minutes 08/07/16   Funches, Adriana Mccallum, MD  nizatidine (AXID) 150 MG capsule Take 1 capsule (150 mg total) by mouth at bedtime. 05/03/18   Ladell Pier, MD  ofloxacin (FLOXIN) 0.3 % OTIC solution Place 5 drops into the right ear daily for 7 days. 05/05/18 05/12/18  Zigmund Gottron, NP  pantoprazole (PROTONIX) 40 MG tablet TAKE 1 TABLET(40 MG) BY MOUTH DAILY 30 TO 60 MINUTES BEFORE FIRST MEAL OF THE DAY 02/18/18   Tanda Rockers, MD  Potassium Chloride ER 20 MEQ TBCR Take 20 mEq by mouth daily. 05/03/18   Ladell Pier, MD  SYMBICORT 80-4.5 MCG/ACT inhaler INHALE 2 PUFFS INTO THE LUNGS TWICE DAILY 04/12/18   Ladell Pier, MD  topiramate  (TOPAMAX) 25 MG capsule Take 3 capsules (75 mg total) by mouth 2 (two) times daily. Please call and schedule a regular office visit for follow-up. 02/21/18   Melvenia Beam, MD    Family History Family History  Problem Relation Age of Onset  . Colon cancer Father   . Diabetes type II Sister   . Diabetes type II Brother   . Colon cancer Sister   . Migraines Neg Hx   . Breast cancer Neg Hx     Social History Social History   Tobacco Use  . Smoking status: Never Smoker  . Smokeless tobacco: Never Used  Substance Use  Topics  . Alcohol use: No  . Drug use: No     Allergies   Patient has no known allergies.   Review of Systems Review of Systems   Physical Exam Triage Vital Signs ED Triage Vitals  Enc Vitals Group     BP 05/05/18 1509 130/75     Pulse Rate 05/05/18 1509 75     Resp 05/05/18 1509 18     Temp 05/05/18 1509 98 F (36.7 C)     Temp Source 05/05/18 1509 Oral     SpO2 05/05/18 1509 97 %     Weight --      Height --      Head Circumference --      Peak Flow --      Pain Score 05/05/18 1511 0     Pain Loc --      Pain Edu? --      Excl. in [place]? --    No data found.  Updated Vital Signs BP 130/75 (BP Location: Right Arm)   Pulse 75   Temp 98 F (36.7 C) (Oral)   Resp 18   SpO2 97%    Physical Exam  Constitutional: She is oriented to person, place, and time. She appears well-developed and well-nourished. No distress.  HENT:  Head: Normocephalic and atraumatic.  Right Ear: Tympanic membrane and external ear normal.  Left Ear: Tympanic membrane, external ear and ear canal normal.  Nose: Nose normal.  Mouth/Throat: Uvula is midline, oropharynx is clear and moist and mucous membranes are normal. No tonsillar exudate.  Small amount of bleeding noted to distal floor of ear canal; no active bleeding; TM WNL; rest of canal without redness or swelling   Eyes: Pupils are equal, round, and reactive to light. Conjunctivae and EOM are normal.    Cardiovascular: Normal rate, regular rhythm and normal heart sounds.  Pulmonary/Chest: Effort normal and breath sounds normal.  Neurological: She is alert and oriented to person, place, and time.  Skin: Skin is warm and dry.     UC Treatments / Results  Labs (all labs ordered are listed, but only abnormal results are displayed) Labs Reviewed - No data to display  EKG None  Radiology No results found.  Procedures Procedures (including critical care time)  Medications Ordered in UC Medications - No data to display  Initial Impression / Assessment and Plan / UC Course  I have reviewed the triage vital signs and the nursing notes.  Pertinent labs & imaging results that were available during my care of the patient were reviewed by me and considered in my medical decision making (see chart for details).     Small amount of bleeding to canal, suspect related to trauma of using hair pain and/or qtip to ear. Discussed avoiding putting anything in the ear. Will cover with antibiotics to prevent infection. Use of flonase daily to help with fullness sensation. If symptoms worsen or do not improve in the next week to return to be seen or to follow up with PCP or ENT.  Patient verbalized understanding and agreeable to plan.   Final Clinical Impressions(s) / UC Diagnoses   Final diagnoses:  Abrasion of right ear canal, initial encounter     Discharge Instructions     Don't put anything further into the ear to allow this to heal.  Use of ear drops daily for the next week to prevent infection.  Use of daily nasal spray to help with fullness sensation.  If worsening  or no improvement please follow up with your PCP or ENT.    ED Prescriptions    Medication Sig Dispense Auth. Provider   fluticasone (FLONASE) 50 MCG/ACT nasal spray Place 1 spray into both nostrils daily. 16 g Augusto Gamble B, NP   ofloxacin (FLOXIN) 0.3 % OTIC solution Place 5 drops into the right ear daily for 7  days. 5 mL Augusto Gamble B, NP     Controlled Substance Prescriptions [place] Controlled Substance Registry consulted? Not Applicable   Zigmund Gottron, NP 05/05/18 1620

## 2018-05-10 ENCOUNTER — Ambulatory Visit: Payer: Medicare Other | Attending: Internal Medicine

## 2018-05-10 DIAGNOSIS — IMO0001 Reserved for inherently not codable concepts without codable children: Secondary | ICD-10-CM

## 2018-05-10 DIAGNOSIS — Z794 Long term (current) use of insulin: Principal | ICD-10-CM

## 2018-05-10 DIAGNOSIS — E785 Hyperlipidemia, unspecified: Secondary | ICD-10-CM

## 2018-05-10 DIAGNOSIS — E1165 Type 2 diabetes mellitus with hyperglycemia: Secondary | ICD-10-CM | POA: Diagnosis not present

## 2018-05-11 ENCOUNTER — Other Ambulatory Visit: Payer: Self-pay | Admitting: Internal Medicine

## 2018-05-11 ENCOUNTER — Telehealth: Payer: Self-pay | Admitting: Internal Medicine

## 2018-05-11 ENCOUNTER — Telehealth: Payer: Self-pay

## 2018-05-11 DIAGNOSIS — Z794 Long term (current) use of insulin: Principal | ICD-10-CM

## 2018-05-11 DIAGNOSIS — IMO0001 Reserved for inherently not codable concepts without codable children: Secondary | ICD-10-CM

## 2018-05-11 DIAGNOSIS — E1165 Type 2 diabetes mellitus with hyperglycemia: Principal | ICD-10-CM

## 2018-05-11 LAB — COMPREHENSIVE METABOLIC PANEL
A/G RATIO: 1.9 (ref 1.2–2.2)
ALK PHOS: 117 IU/L (ref 39–117)
ALT: 32 IU/L (ref 0–32)
AST: 25 IU/L (ref 0–40)
Albumin: 4.4 g/dL (ref 3.6–4.8)
BUN/Creatinine Ratio: 12 (ref 12–28)
BUN: 18 mg/dL (ref 8–27)
CO2: 26 mmol/L (ref 20–29)
Calcium: 10.1 mg/dL (ref 8.7–10.3)
Chloride: 98 mmol/L (ref 96–106)
Creatinine, Ser: 1.52 mg/dL — ABNORMAL HIGH (ref 0.57–1.00)
GFR calc Af Amer: 41 mL/min/{1.73_m2} — ABNORMAL LOW (ref >59)
GFR calc non Af Amer: 35 mL/min/{1.73_m2} — ABNORMAL LOW (ref >59)
GLOBULIN, TOTAL: 2.3 g/dL (ref 1.5–4.5)
Glucose: 116 mg/dL — ABNORMAL HIGH (ref 65–99)
Potassium: 3.5 mmol/L (ref 3.5–5.2)
SODIUM: 142 mmol/L (ref 134–144)
Total Protein: 6.7 g/dL (ref 6.0–8.5)

## 2018-05-11 LAB — LIPID PANEL
CHOL/HDL RATIO: 2.1 ratio (ref 0.0–4.4)
Cholesterol, Total: 113 mg/dL (ref 100–199)
HDL: 54 mg/dL (ref >39)
LDL Calculated: 37 mg/dL (ref 0–99)
TRIGLYCERIDES: 111 mg/dL (ref 0–149)
VLDL Cholesterol Cal: 22 mg/dL (ref 5–40)

## 2018-05-11 NOTE — Telephone Encounter (Signed)
Contacted pt to go over Dr. Wynetta Emery response pt is aware and doesn't have any questions or concerns  Schedule appointment with [facility] for 11/20 @10am . Pt is aware to record blood sugars for 1-2 weeks and to bring it with her on her visit

## 2018-05-11 NOTE — Telephone Encounter (Signed)
Contacted pt to go over lab results pt is aware of results   Dr. Wynetta Emery pt is wanting to know if you can rewrite the insulin at a higher dose since she is not going to take the new dm medication and what stage is her kidney function in

## 2018-05-11 NOTE — Telephone Encounter (Signed)
Lab note sent to CMA: Let pt know that her kidney function is not 100% and a little worse compared to when last checked in July.  Potassium level is normal.  Avoid taking any OTC pain meds.  Given kidney function is worse, I also recommend holding off on taking the new diabetic medication called Wilder Glade that I prescribed on last visit.  Will refer to an endocrinologist to help with management of her diabetes.

## 2018-05-16 DIAGNOSIS — E113512 Type 2 diabetes mellitus with proliferative diabetic retinopathy with macular edema, left eye: Secondary | ICD-10-CM | POA: Diagnosis not present

## 2018-05-16 DIAGNOSIS — E113511 Type 2 diabetes mellitus with proliferative diabetic retinopathy with macular edema, right eye: Secondary | ICD-10-CM | POA: Diagnosis not present

## 2018-05-19 ENCOUNTER — Other Ambulatory Visit: Payer: Self-pay | Admitting: Internal Medicine

## 2018-05-19 DIAGNOSIS — I10 Essential (primary) hypertension: Secondary | ICD-10-CM

## 2018-05-19 DIAGNOSIS — I5033 Acute on chronic diastolic (congestive) heart failure: Secondary | ICD-10-CM

## 2018-05-20 ENCOUNTER — Telehealth: Payer: Self-pay | Admitting: Internal Medicine

## 2018-05-20 DIAGNOSIS — E1165 Type 2 diabetes mellitus with hyperglycemia: Principal | ICD-10-CM

## 2018-05-20 DIAGNOSIS — IMO0001 Reserved for inherently not codable concepts without codable children: Secondary | ICD-10-CM

## 2018-05-20 NOTE — Telephone Encounter (Signed)
Patient called requesting insulin refill, patient stated that her last A1C was high and that she can't miss taking it, but that she is completely out and needs it. Please follow up.

## 2018-05-22 MED ORDER — INSULIN LISPRO PROT & LISPRO (75-25 MIX) 100 UNIT/ML ~~LOC~~ SUSP: SUBCUTANEOUS | 11 refills | Status: DC

## 2018-05-24 ENCOUNTER — Encounter: Payer: Self-pay | Admitting: Pharmacist

## 2018-05-24 DIAGNOSIS — M5030 Other cervical disc degeneration, unspecified cervical region: Secondary | ICD-10-CM | POA: Diagnosis not present

## 2018-05-25 ENCOUNTER — Ambulatory Visit: Payer: Medicare Other | Attending: Family Medicine | Admitting: Pharmacist

## 2018-05-25 ENCOUNTER — Encounter: Payer: Self-pay | Admitting: Pharmacist

## 2018-05-25 DIAGNOSIS — E1165 Type 2 diabetes mellitus with hyperglycemia: Secondary | ICD-10-CM

## 2018-05-25 DIAGNOSIS — Z794 Long term (current) use of insulin: Secondary | ICD-10-CM | POA: Insufficient documentation

## 2018-05-25 DIAGNOSIS — E119 Type 2 diabetes mellitus without complications: Secondary | ICD-10-CM | POA: Diagnosis not present

## 2018-05-25 DIAGNOSIS — IMO0001 Reserved for inherently not codable concepts without codable children: Secondary | ICD-10-CM

## 2018-05-25 DIAGNOSIS — Z833 Family history of diabetes mellitus: Secondary | ICD-10-CM | POA: Insufficient documentation

## 2018-05-25 LAB — GLUCOSE, POCT (MANUAL RESULT ENTRY)
POC GLUCOSE: 118 mg/dL — AB (ref 70–99)
POC Glucose: 45 mg/dl — AB (ref 70–99)

## 2018-05-25 NOTE — Progress Notes (Signed)
    S:     No chief complaint on file.  Patient arrives in good [facility].  Presents for diabetes management at the request of Dr. Wynetta Emery. Patient was referred on 05/03/18 by Dr. Wynetta Emery. Wilder Glade was added but held after [facility] revealed decline in renal function.   Today, pt's blood sugar is 45 on initial reading but she denies having symptoms of hypoglycemia now or at home. Pt given regular Coke (6 oz). Recheck glucose was 118.   She reports adherence to Humalog 75/25 but self-doses inappropriately. Upon review of her chart, this has been a problem for her in the past. She is supposed to be taking 80 units in the morning and 55 units in the evening.   She reports baseline neuropathy but denies other symptoms of uncontrolled hyperglycemia. She reports doing better with her diet but does not get much exercise.   Family/Social History: FHx significant for DM in her brother and sister, never smoker, denies alcohol use    Insurance coverage/medication affordability: Medicare A&B  O:  POCT: 45; recheck was 118 after 6 oz of regular soda    Since 10/29:  Home fasting CBG: 87 - 259 (1 outlier of 308)  Before lunch:  95 - 184 (2 outliers of 243 and 312) Before dinner: 71 - 200 (3 outliers of 229, 265, and 271)  Lab Results  Component Value Date   HGBA1C 10.0 (A) 05/03/2018   There were no vitals filed for this visit.  Lipid Panel     Component Value Date/Time   CHOL 113 05/10/2018 0840   TRIG 111 05/10/2018 0840   HDL 54 05/10/2018 0840   CHOLHDL 2.1 05/10/2018 0840   CHOLHDL 2.1 08/11/2016 1109   VLDL 28 08/11/2016 1109   LDLCALC 37 05/10/2018 0840   Clinical ASCVD: No  ASCVD risk: not calculated d/t TC < 130  A/P: Diabetes longstanding currently uncontrolled. Patient is able to verbalize appropriate hypoglycemia management plan. Patient is adherent with insulin but self-titrates.  I think she would benefit from Lantus or Levemir but she is adamant that her 75/25 mix is working  for her. She does not report any objective values <70. However, I think she has some undetected hypoglycemia as evidenced today. She reports feeling well even though her POCT value was in the 40s. She insists that she does not need to make changes and wants to follow-up with Endo.  -Continued insulin. Encouraged pt to take 80 units in the morning and 55 units in the evening as prescribed.  -Extensively discussed pathophysiology of DM, recommended lifestyle interventions, dietary effects on glycemic control -Counseled on s/sx of and management of hypoglycemia -HM: current with PNA and tetanus vaccines; declined flu shot -Next A1C anticipated 07/2018.   ASCVD risk - primary prevention in patient with DM. Last LDL is controlled. Will continue with current high intensity statin.  -Continued atorvastatin 40 mg.   Written patient instructions provided.  Total time in face to face counseling 15 minutes.   Follow up PCP Clinic Visit 07/04/18.    Patient seen with: Dixon Boos, PharmD Candidate [employer] 2021  Benard Halsted, PharmD, [place] [phone]

## 2018-05-27 DIAGNOSIS — E113511 Type 2 diabetes mellitus with proliferative diabetic retinopathy with macular edema, right eye: Secondary | ICD-10-CM | POA: Diagnosis not present

## 2018-05-31 NOTE — Progress Notes (Deleted)
Name: Heidi [name]  MRN/ DOB: [id number], 05/17/1951   Age/ Sex: 67 y.o., female    PCP: Heidi Pier, MD   Reason for Endocrinology Evaluation: Type {NUMBERS 1 OR 2:522190} Diabetes Mellitus     Date of Initial Endocrinology Visit: 05/31/2018     PATIENT IDENTIFIER: [name] is a 67 y.o. female with a past medical history of ***. The patient presented for initial endocrinology clinic visit on 05/31/2018 for consultative assistance with her diabetes management.    HPI: Heidi [name] was    Diagnosed with DM *** Prior Medications tried/Intolerance: *** Currently checking blood sugars *** x / day,  before breakfast and ***.  Hypoglycemia episodes : ***               Symptoms: ***                 Frequency: ***/  Hemoglobin A1c has ranged from *** in ***, peaking at *** in***. Patient required assistance for hypoglycemia:  Patient has required hospitalization within the last 1 year from hyper or hypoglycemia:   In terms of diet, the patient ***   HOME DIABETES REGIMEN: Basal: ***  Bolus: ***   Statin: {Yes/No:11203} ACE-I/ARB: {YES/NO:17245} Prior Diabetic Education: {Yes/No:11203}   METER DOWNLOAD SUMMARY: Date range evaluated: *** Fingerstick Blood Glucose Tests = *** Average Number Tests/Day = *** Overall Mean FS Glucose = *** Standard Deviation = ***  BG Ranges: Low = *** High = ***   Hypoglycemic Events/30 Days: BG < 50 = *** Episodes of symptomatic severe hypoglycemia = ***   DIABETIC COMPLICATIONS: Microvascular complications:   ***  Denies: ***  Last eye exam: Completed   Macrovascular complications:   ***  Denies: CAD, PVD, CVA   PAST HISTORY: Past Medical History:  Past Medical History:  Diagnosis Date  . Adenomatous colon polyp   . Anemia   . Anxiety   . Asthma   . CHF (congestive heart failure) ([place])   . Diabetes mellitus without complication ([place])   . High cholesterol   . Hypertension   . Left knee injury   .  Pneumonia     Past Surgical History:  Past Surgical History:  Procedure Laterality Date  . ABDOMINAL HYSTERECTOMY    . CATARACT EXTRACTION Left   . CATARACT EXTRACTION     LT 01/2017, 02/2017 on RT  . CESAREAN SECTION        Social History:  reports that she has never smoked. She has never used smokeless tobacco. She reports that she does not drink alcohol or use drugs. Family History:  Family History  Problem Relation Age of Onset  . Colon cancer Father   . Diabetes type II Sister   . Diabetes type II Brother   . Colon cancer Sister   . Migraines Neg Hx   . Breast cancer Neg Hx       HOME MEDICATIONS: Allergies as of 06/06/2018   No Known Allergies     Medication List        Accurate as of 05/31/18  3:46 PM. Always use your most recent med list.          Acetaminophen-Codeine 300-30 MG tablet Take 1 tablet by mouth 2 (two) times daily.   albuterol 108 (90 Base) MCG/ACT inhaler Commonly known as:  PROVENTIL HFA;VENTOLIN HFA INHALE 2 PUFFS INTO THE LUNGS EVERY 6 HOURS AS NEEDED FOR WHEEZING OR SHORTNESS OF BREATH   albuterol (2.5 MG/3ML) 0.083% nebulizer solution  Commonly known as:  PROVENTIL USE 1 VIAL VIA NEBULIZER EVERY 6 HOURS AS NEEDED FOR WHEEZING OR SHORTNESS OF BREATH   aspirin 81 MG chewable tablet Chew 81 mg by mouth daily.   atenolol 25 MG tablet Commonly known as:  TENORMIN TAKE 1 TABLET(25 MG) BY MOUTH DAILY   atorvastatin 40 MG tablet Commonly known as:  LIPITOR Take 1 tablet by mouth daily at 6pm   calcium-vitamin D 500-200 MG-UNIT tablet Commonly known as:  OSCAL WITH D Take 1 tablet by mouth daily.   cetirizine 10 MG tablet Commonly known as:  ZYRTEC Take 1 tablet (10 mg total) by mouth daily.   fluticasone 50 MCG/ACT nasal spray Commonly known as:  FLONASE Place 1 spray into both nostrils daily.   freestyle lancets TEST BLOOD SUGAR LEVELS THREE TIMES DAILY   glucose blood test strip Use as instructed for 3 times daily  testing of blood glucose.   hydrochlorothiazide 25 MG tablet Commonly known as:  HYDRODIURIL Take 1 tablet (25 mg total) by mouth daily.   insulin lispro protamine-lispro (75-25) 100 UNIT/ML Susp injection Commonly known as:  HUMALOG 75/25 MIX Inject subcutaneously 80 U in the morning with food and 55 U at in evening with meals   INSULIN SYRINGE 1CC/29G 29G X 1/2" 1 ML Misc Use as directed three times daily. MUST MAKE APPT FOR FURTHER REFILLS   losartan 50 MG tablet Commonly known as:  COZAAR Take 1 tablet (50 mg total) by mouth daily.   montelukast 10 MG tablet Commonly known as:  SINGULAIR TAKE 1 TABLET(10 MG) BY MOUTH DAILY   MUCINEX PO Take by mouth.   multivitamin with minerals Tabs tablet Take 1 tablet by mouth daily.   nitroGLYCERIN 0.3 MG SL tablet Commonly known as:  NITROSTAT Place 1 tablet (0.3 mg total) under the tongue every 5 (five) minutes as needed for chest pain. Max 3 tablets in 15 minutes   nizatidine 150 MG capsule Commonly known as:  AXID Take 1 capsule (150 mg total) by mouth at bedtime.   pantoprazole 40 MG tablet Commonly known as:  PROTONIX TAKE 1 TABLET(40 MG) BY MOUTH DAILY 30 TO 60 MINUTES BEFORE FIRST MEAL OF THE DAY   Potassium Chloride ER 20 MEQ Tbcr Take 20 mEq by mouth daily.   SYMBICORT 80-4.5 MCG/ACT inhaler Generic drug:  budesonide-formoterol INHALE 2 PUFFS INTO THE LUNGS TWICE DAILY   topiramate 25 MG capsule Commonly known as:  TOPAMAX Take 3 capsules (75 mg total) by mouth 2 (two) times daily. Please call and schedule a regular office visit for follow-up.   VITAMIN C PO Take 1 tablet by mouth daily.        ALLERGIES: No Known Allergies   REVIEW OF SYSTEMS: A comprehensive ROS was conducted with the patient and is negative except as per HPI and below:  ROS    OBJECTIVE:   VITAL SIGNS: There were no vitals taken for this visit.   PHYSICAL EXAM:  General: Pt appears well and is in NAD  Hydration:  Well-hydrated with moist mucous membranes and good skin turgor  HEENT: Head: Unremarkable with good dentition. Oropharynx clear without exudate.  Eyes: External eye exam normal without stare, lid lag or exophthalmos.  EOM intact.  PERRL.  Neck: General: Supple without adenopathy or carotid bruits. Thyroid: Thyroid size normal.  No goiter or nodules appreciated. No thyroid bruit.  Lungs: Clear with good BS bilat with no rales, rhonchi, or wheezes  Heart: RRR with normal S1  and S2 and no gallops; no murmurs; no rub  Abdomen: Normoactive bowel sounds, soft, nontender, without masses or organomegaly palpable  Extremities:  Lower extremities - No pretibial edema. No lesions.  Skin: Normal texture and temperature to palpation. No rash noted. No Acanthosis nigricans/skin tags. No lipohypertrophy.  Neuro: MS is good with appropriate affect, pt is alert and Ox3    DM foot exam:    DATA REVIEWED:  Lab Results  Component Value Date   HGBA1C 10.0 (A) 05/03/2018   HGBA1C 11.8 09/06/2017   HGBA1C 11.0 (H) 06/04/2017   Lab Results  Component Value Date   MICROALBUR 0.3 02/11/2015   LDLCALC 37 05/10/2018   CREATININE 1.52 (H) 05/10/2018   No results found for: MICRALBCREAT  Lab Results  Component Value Date   CHOL 113 05/10/2018   HDL 54 05/10/2018   LDLCALC 37 05/10/2018   TRIG 111 05/10/2018   CHOLHDL 2.1 05/10/2018        ASSESSMENT / PLAN / RECOMMENDATIONS:   1) Type *** Diabetes Mellitus, ***controlled, With*** complications - Most recent A1c of *** %. Goal A1c < *** %.  ***  Plan: GENERAL:  ***  MEDICATIONS:  ***  EDUCATION / INSTRUCTIONS:  BG monitoring instructions: Patient is instructed to check her blood sugars *** times a day, ***.  Call [facility] if: BG persistently < 70 or > 300. . I reviewed the Rule of 15 for the treatment of hypoglycemia in detail with the patient. Literature supplied.   2) Diabetic complications:   Eye: Does ***  have known diabetic retinopathy. Last eye exam was   Neuro/ Feet: Does *** have known diabetic peripheral neuropathy.  Renal: Patient does *** have known baseline CKD. She is *** on an ACEI/ARB at present.   3) Lipids: Patient is *** on a statin.    4) Hypertension: ***  at goal of < 140/90 mmHg.       Signed electronically by: Mack Guise, MD  [facility]  [facility] [address] [place], [place] [address] Phone: [phone] FAX: [phone]   CC: Heidi Pier, MD [place] [address] Phone: [phone]  Fax: [phone]    Return to Endocrinology clinic as below: Future Appointments  Date Time Provider [place]  06/06/2018  2:00 PM Shamleffer, Melanie Crazier, MD [facility] None  06/22/2018 11:30 AM Melvenia Beam, MD [facility] None  07/04/2018  8:50 AM Heidi Pier, MD [facility] None

## 2018-06-06 ENCOUNTER — Ambulatory Visit: Payer: Medicare Other | Admitting: Internal Medicine

## 2018-06-06 DIAGNOSIS — Z0289 Encounter for other administrative examinations: Secondary | ICD-10-CM

## 2018-06-07 ENCOUNTER — Other Ambulatory Visit: Payer: Self-pay | Admitting: Internal Medicine

## 2018-06-07 DIAGNOSIS — E1165 Type 2 diabetes mellitus with hyperglycemia: Secondary | ICD-10-CM

## 2018-06-07 DIAGNOSIS — IMO0002 Reserved for concepts with insufficient information to code with codable children: Secondary | ICD-10-CM

## 2018-06-14 DIAGNOSIS — E119 Type 2 diabetes mellitus without complications: Secondary | ICD-10-CM | POA: Diagnosis not present

## 2018-06-14 DIAGNOSIS — M5030 Other cervical disc degeneration, unspecified cervical region: Secondary | ICD-10-CM | POA: Diagnosis not present

## 2018-06-14 DIAGNOSIS — I509 Heart failure, unspecified: Secondary | ICD-10-CM | POA: Diagnosis not present

## 2018-06-18 ENCOUNTER — Other Ambulatory Visit: Payer: Self-pay | Admitting: Internal Medicine

## 2018-06-18 DIAGNOSIS — Z780 Asymptomatic menopausal state: Secondary | ICD-10-CM

## 2018-06-22 ENCOUNTER — Encounter: Payer: Self-pay | Admitting: Neurology

## 2018-06-22 ENCOUNTER — Telehealth: Payer: Self-pay | Admitting: *Deleted

## 2018-06-22 ENCOUNTER — Ambulatory Visit: Payer: Medicare Other | Admitting: Neurology

## 2018-06-22 NOTE — Telephone Encounter (Signed)
Spoke with patient regarding her botox appt r/s. Pt sick today. Offered appt for Friday. She said she could take that however her insurance has changed. She no longer has medicaid. She now has [employer] effective 06/06/18. Pt gave the following information:  ID: [id number] RxBIN: [id number] RxGRP: [id number] RxPCN: [id number]  RN informed pt that she would give this information to [facility] and we would be in touch with her about r/s her appt to sooner than 07/25/18.

## 2018-06-27 ENCOUNTER — Telehealth: Payer: Self-pay | Admitting: Internal Medicine

## 2018-06-27 ENCOUNTER — Telehealth: Payer: Self-pay

## 2018-06-27 ENCOUNTER — Other Ambulatory Visit: Payer: Self-pay | Admitting: Internal Medicine

## 2018-06-27 DIAGNOSIS — E1165 Type 2 diabetes mellitus with hyperglycemia: Principal | ICD-10-CM

## 2018-06-27 DIAGNOSIS — IMO0001 Reserved for inherently not codable concepts without codable children: Secondary | ICD-10-CM

## 2018-06-27 MED ORDER — ACCU-CHEK GUIDE W/DEVICE KIT: 1.0000 | PACK | Freq: Three times a day (TID) | 0 refills | Status: DC

## 2018-06-27 MED ORDER — GLUCOSE BLOOD VI STRP: ORAL_STRIP | 12 refills | Status: DC

## 2018-06-27 MED ORDER — ACCU-CHEK FASTCLIX LANCETS MISC: 12 refills | Status: DC

## 2018-06-27 NOTE — Telephone Encounter (Signed)
Pt came in to request prior-Auth on -glucose blood test strip  Please follow up as soon as possible

## 2018-06-27 NOTE — Telephone Encounter (Signed)
Pt called my direct line to let me know she did in fact need a new rx for the Accu-Chek Guide meter/strips/lancets.  Rx was sent and [facility] confirmed they went thru insurance and they should have them ready by 5pm. Heidi [name] was also informed and told to call back if anything else comes up.

## 2018-06-27 NOTE — Telephone Encounter (Signed)
I called and spoke with both the pharmacy and the insurance company. The Accu-Chek Guide is the preferred diabetic meter for her insurance.   I went to the lobby to speak with the patient to inform her. She had just completed a call with her insurance company and let me know she was able to 'fix it herself.'  I spoke with her for a few minutes and she expressed her disappointment with how she was helped. She was trying to take initiative and be in control of her heath care, which I commended her for, but she felt that she was not being taken seriously. I apologized for that and let her know my job is to work between pharmacies and our clinic and gave her my info and told her she can contact myself directly if she has any further issues with her test strips or anything else medication or prescription wise. She thanked me and left.

## 2018-07-04 ENCOUNTER — Ambulatory Visit: Payer: Medicare Other | Admitting: Internal Medicine

## 2018-07-04 NOTE — Telephone Encounter (Signed)
Noted, will work for the beginning of the year.

## 2018-07-12 NOTE — Telephone Encounter (Signed)
Patient called and I informed her we are working on authorizations and coverage. She would like something to be called in in the meantime.

## 2018-07-12 NOTE — Telephone Encounter (Signed)
Pt's next Botox appt scheduled for 07/25/2018. Will send pt's request to Dr. Jaynee Eagles.

## 2018-07-12 NOTE — Telephone Encounter (Signed)
I can't just give her medication, she has failed everything. Only botox worked. Let's just work her in earlier for the botox, maybe this Friday at noon if possible?

## 2018-07-12 NOTE — Telephone Encounter (Signed)
Please call and advise [phone].

## 2018-07-14 NOTE — Telephone Encounter (Signed)
I spoke with the patient. Discussed that Andee Poles has been working hard on her authorization for botox and it could be another 3 days before we hear back. Advised pt that we will try to get her in as soon as we can after we receive authorization. Discussed that Dr. Jaynee Eagles is not going to start a new medication when the Botox has worked so well for her. Advised patient that she could speak with her PCP if she so chooses in the meantime. Pt verbalized understanding and appreciation.

## 2018-07-14 NOTE — Telephone Encounter (Signed)
Spoke with Dr. Jaynee Eagles. Will await authorization and when we receive approval will see if there is any availability of seeing pt sooner than 07/25/2018.

## 2018-07-14 NOTE — Telephone Encounter (Signed)
I called [employer] to check authorization. It is pending approval with [employer]. All clinicals have been given. It could be another 3 days before we get response. [place] drug auth [id number]  [id number] VUD#[id number]. DW

## 2018-07-18 ENCOUNTER — Telehealth: Payer: Self-pay | Admitting: Internal Medicine

## 2018-07-18 NOTE — Telephone Encounter (Addendum)
I called the patient and offered her a sooner appt. She was scheduled for this Wed 07/20/2018 @ 3:30 arrival 3:00-3:15. Pt verbalized appreciation and asked if she could find out what her copay is.   appt for 07/25/18 was canceled since pt seen earlier.

## 2018-07-18 NOTE — Telephone Encounter (Signed)
Botox authorized [id number] (07/06/19). DW

## 2018-07-18 NOTE — Telephone Encounter (Signed)
Pt called stating her insurance will no longer cover  -insulin lispro protamine-lispro (HUMALOG 75/25 MIX) (75-25) 100 UNIT/ML SUSP injection  And needs to be switched to  -NOVOLOG Please follow up to  -[place], [place] [facility] [place]

## 2018-07-18 NOTE — Telephone Encounter (Signed)
Noted, thank you

## 2018-07-18 NOTE — Telephone Encounter (Signed)
Noted, will call patient to schedule her botox appt.

## 2018-07-19 MED ORDER — INSULIN ASPART PROT & ASPART (70-30 MIX) 100 UNIT/ML ~~LOC~~ SUSP: SUBCUTANEOUS | 11 refills | Status: DC

## 2018-07-19 NOTE — Telephone Encounter (Signed)
Heidi [name] is this only insulin her insurance will cover

## 2018-07-19 NOTE — Telephone Encounter (Signed)
Spoke with luke and per Lurena Joiner would try 70/30 and dosgae will be up to Dr. Wynetta Emery.

## 2018-07-19 NOTE — Telephone Encounter (Signed)
Noted, thank you

## 2018-07-20 ENCOUNTER — Ambulatory Visit: Payer: Medicare HMO | Admitting: Neurology

## 2018-07-20 VITALS — BP 130/80 | HR 86

## 2018-07-20 DIAGNOSIS — G43709 Chronic migraine without aura, not intractable, without status migrainosus: Secondary | ICD-10-CM | POA: Diagnosis not present

## 2018-07-20 NOTE — Progress Notes (Signed)
Botox- 100 units x 2 vials Lot: [id number] Expiration: 12/2020 NDC: 3085-6943-70  Bacteriostatic 0.9% Sodium Chloride- 92mL total Lot: [id number] Expiration: 04/06/2019 NDC: 0289-0228-40  Dx: A98.614 B/B

## 2018-07-21 NOTE — Progress Notes (Addendum)
Interval history 07/20/2018: Patient with chronic intractable migraines. Doing exceptionally well on Botox for Migraines. Prior tried multiple classes of medications which did not help with headaches or migraines until she started Botox. Since starting botox she has had  >80% improvement in frequency and severity. The migraines she does have are sparse are easier to treat and >50% less severe.Nothing has worked in the past except botox and Topiramate.  NO temples, no masseters, no LS, no eyes    Consent Form Botulism Toxin Injection For Chronic Migraine    Reviewed orally with patient, additionally signature is on file:  Botulism toxin has been approved by the Federal drug administration for treatment of chronic migraine. Botulism toxin does not cure chronic migraine and it may not be effective in some patients.  The administration of botulism toxin is accomplished by injecting a small amount of toxin into the muscles of the neck and head. Dosage must be titrated for each individual. Any benefits resulting from botulism toxin tend to wear off after 3 months with a repeat injection required if benefit is to be maintained. Injections are usually done every 3-4 months with maximum effect peak achieved by about 2 or 3 weeks. Botulism toxin is expensive and you should be sure of what costs you will incur resulting from the injection.  The side effects of botulism toxin use for chronic migraine may include:   -Transient, and usually mild, facial weakness with facial injections  -Transient, and usually mild, head or neck weakness with head/neck injections  -Reduction or loss of forehead facial animation due to forehead muscle weakness  -Eyelid drooping  -Dry eye  -Pain at the site of injection or bruising at the site of injection  -Double vision  -Potential unknown long term risks  Contraindications: You should not have Botox if you are pregnant, nursing, allergic to albumin, have an infection,  skin condition, or muscle weakness at the site of the injection, or have myasthenia gravis, Lambert-Eaton syndrome, or ALS.  It is also possible that as with any injection, there may be an allergic reaction or no effect from the medication. Reduced effectiveness after repeated injections is sometimes seen and rarely infection at the injection site may occur. All care will be taken to prevent these side effects. If therapy is given over a long time, atrophy and wasting in the muscle injected may occur. Occasionally the patient's become refractory to treatment because they develop antibodies to the toxin. In this event, therapy needs to be modified.  I have read the above information and consent to the administration of botulism toxin.    BOTOX PROCEDURE NOTE FOR MIGRAINE HEADACHE    Contraindications and precautions discussed with patient(above). Aseptic procedure was observed and patient tolerated procedure. Procedure performed by Dr. Georgia Dom  The condition has existed for more than 6 months, and pt does not have a diagnosis of ALS, Myasthenia Gravis or Lambert-Eaton Syndrome.  Risks and benefits of injections discussed and pt agrees to proceed with the procedure.  Written consent obtained  These injections are medically necessary. Pt  receives good benefits from these injections. These injections do not cause sedations or hallucinations which the oral therapies may cause.  Description of procedure:  The patient was placed in a sitting position. The standard protocol was used for Botox as follows, with 5 units of Botox injected at each site:   -Procerus muscle, midline injection  -Corrugator muscle, bilateral injection  -Frontalis muscle, bilateral injection, with 2 sites each side, medial  injection was performed in the upper one third of the frontalis muscle, in the region vertical from the medial inferior edge of the superior orbital rim. The lateral injection was again in the upper  one third of the forehead vertically above the lateral limbus of the cornea, 1.5 cm lateral to the medial injection site.  - Levator Scapulae: 5 units bilaterally  -Temporalis muscle injection, 5 sites, bilaterally. The first injection was 3 cm above the tragus of the ear, second injection site was 1.5 cm to 3 cm up from the first injection site in line with the tragus of the ear. The third injection site was 1.5-3 cm forward between the first 2 injection sites. The fourth injection site was 1.5 cm posterior to the second injection site.  -Occipitalis muscle injection, 3 sites, bilaterally. The first injection was done one half way between the occipital protuberance and the tip of the mastoid process behind the ear. The second injection site was done lateral and superior to the first, 1 fingerbreadth from the first injection. The third injection site was 1 fingerbreadth superiorly and medially from the first injection site.  -Cervical paraspinal muscle injection, 2 sites, bilateral knee first injection site was 1 cm from the midline of the cervical spine, 3 cm inferior to the lower border of the occipital protuberance. The second injection site was 1.5 cm superiorly and laterally to the first injection site.  -Trapezius muscle injection was performed at 3 sites, bilaterally. The first injection site was in the upper trapezius muscle halfway between the inflection point of the neck, and the acromion. The second injection site was one half way between the acromion and the first injection site. The third injection was done between the first injection site and the inflection point of the neck.   Will return for repeat injection in 3 months.   A 200 unit sof Botox was used, any Botox not injected was wasted. The patient tolerated the procedure well, there were no complications of the above procedure.

## 2018-07-25 ENCOUNTER — Ambulatory Visit: Payer: Medicare HMO | Admitting: Neurology

## 2018-07-25 ENCOUNTER — Encounter

## 2018-07-27 DIAGNOSIS — E113511 Type 2 diabetes mellitus with proliferative diabetic retinopathy with macular edema, right eye: Secondary | ICD-10-CM | POA: Diagnosis not present

## 2018-07-27 DIAGNOSIS — E113513 Type 2 diabetes mellitus with proliferative diabetic retinopathy with macular edema, bilateral: Secondary | ICD-10-CM | POA: Diagnosis not present

## 2018-07-27 DIAGNOSIS — E113512 Type 2 diabetes mellitus with proliferative diabetic retinopathy with macular edema, left eye: Secondary | ICD-10-CM | POA: Diagnosis not present

## 2018-08-05 ENCOUNTER — Other Ambulatory Visit: Payer: Self-pay | Admitting: Internal Medicine

## 2018-08-05 DIAGNOSIS — I10 Essential (primary) hypertension: Secondary | ICD-10-CM

## 2018-08-05 DIAGNOSIS — I5033 Acute on chronic diastolic (congestive) heart failure: Secondary | ICD-10-CM

## 2018-08-16 ENCOUNTER — Encounter: Payer: Self-pay | Admitting: Internal Medicine

## 2018-08-16 ENCOUNTER — Ambulatory Visit: Payer: Medicare HMO | Attending: Internal Medicine | Admitting: Internal Medicine

## 2018-08-16 VITALS — BP 133/80 | HR 91 | Temp 98.4°F | Resp 16 | Ht 64.0 in | Wt 238.2 lb

## 2018-08-16 DIAGNOSIS — I130 Hypertensive heart and chronic kidney disease with heart failure and stage 1 through stage 4 chronic kidney disease, or unspecified chronic kidney disease: Secondary | ICD-10-CM | POA: Insufficient documentation

## 2018-08-16 DIAGNOSIS — E1165 Type 2 diabetes mellitus with hyperglycemia: Secondary | ICD-10-CM | POA: Diagnosis not present

## 2018-08-16 DIAGNOSIS — N183 Chronic kidney disease, stage 3 unspecified: Secondary | ICD-10-CM

## 2018-08-16 DIAGNOSIS — K219 Gastro-esophageal reflux disease without esophagitis: Secondary | ICD-10-CM | POA: Insufficient documentation

## 2018-08-16 DIAGNOSIS — N281 Cyst of kidney, acquired: Secondary | ICD-10-CM | POA: Insufficient documentation

## 2018-08-16 DIAGNOSIS — L309 Dermatitis, unspecified: Secondary | ICD-10-CM | POA: Diagnosis not present

## 2018-08-16 DIAGNOSIS — E1122 Type 2 diabetes mellitus with diabetic chronic kidney disease: Secondary | ICD-10-CM | POA: Insufficient documentation

## 2018-08-16 DIAGNOSIS — E041 Nontoxic single thyroid nodule: Secondary | ICD-10-CM | POA: Diagnosis not present

## 2018-08-16 DIAGNOSIS — E11319 Type 2 diabetes mellitus with unspecified diabetic retinopathy without macular edema: Secondary | ICD-10-CM | POA: Diagnosis not present

## 2018-08-16 DIAGNOSIS — Z7982 Long term (current) use of aspirin: Secondary | ICD-10-CM | POA: Insufficient documentation

## 2018-08-16 DIAGNOSIS — Z6841 Body Mass Index (BMI) 40.0 and over, adult: Secondary | ICD-10-CM

## 2018-08-16 DIAGNOSIS — Z79899 Other long term (current) drug therapy: Secondary | ICD-10-CM | POA: Insufficient documentation

## 2018-08-16 DIAGNOSIS — M542 Cervicalgia: Secondary | ICD-10-CM | POA: Insufficient documentation

## 2018-08-16 DIAGNOSIS — I5032 Chronic diastolic (congestive) heart failure: Secondary | ICD-10-CM | POA: Insufficient documentation

## 2018-08-16 DIAGNOSIS — M170 Bilateral primary osteoarthritis of knee: Secondary | ICD-10-CM | POA: Insufficient documentation

## 2018-08-16 DIAGNOSIS — I10 Essential (primary) hypertension: Secondary | ICD-10-CM | POA: Diagnosis not present

## 2018-08-16 DIAGNOSIS — Z794 Long term (current) use of insulin: Secondary | ICD-10-CM | POA: Diagnosis not present

## 2018-08-16 DIAGNOSIS — G4733 Obstructive sleep apnea (adult) (pediatric): Secondary | ICD-10-CM | POA: Insufficient documentation

## 2018-08-16 DIAGNOSIS — E785 Hyperlipidemia, unspecified: Secondary | ICD-10-CM | POA: Insufficient documentation

## 2018-08-16 DIAGNOSIS — G959 Disease of spinal cord, unspecified: Secondary | ICD-10-CM | POA: Insufficient documentation

## 2018-08-16 DIAGNOSIS — J4530 Mild persistent asthma, uncomplicated: Secondary | ICD-10-CM | POA: Insufficient documentation

## 2018-08-16 LAB — GLUCOSE, POCT (MANUAL RESULT ENTRY): POC GLUCOSE: 137 mg/dL — AB (ref 70–99)

## 2018-08-16 NOTE — Progress Notes (Signed)
Patient ID: Heidi [name], female    DOB: Nov 12, 1950  MRN: [id number]  CC: Diabetes and Hypertension   Subjective: [name] is a 68 y.o. female who presents for chronic ds management. Her concerns today include:  68 year old female with history of HTN, CHF, migraines([facility] on Botox inj),OSA (noton BiPAP),upper airway cough syndrome, GERD, diabetes2, CKD stage3, OA knees,DDD of cervical spine (followed by Dr. Letta Pate and on Tyl#3)obesity, HL,  DIABETES TYPE 2 Last A1C:   Results for orders placed or performed in visit on 08/16/18  POCT glucose (manual entry)  Result Value Ref Range   POC Glucose 137 (A) 70 - 99 mg/dl    Med Adherence:  [x]  Yes.  Had started [place] on last visit but I told her to stop after repeat BMP revealed worsening creat and GFR.  Referred to endocrinology but she did not go because of high co-pay.  She states that she is seeing several other specialist and just cannot afford to see another one.  She also states that her blood sugars have significantly improved since last visit. Medication side effects:  []  Yes    [x]  No Home Monitoring?  [x]  Yes, TID    []  No Home glucose results range:has log with her:  Before BF 93-197 with most being in low 100s, lunch 98-112 and before dinner 98-150 Diet Adherence: [x]  Yes _ "I'm doing better.  I don't eat as much and I'm staying away from sweets"   []  No Exercise: [x]  Yes, does chair exercises and walks with her dog daily for 30 mins    []  No Hypoglycemic episodes?: [x]  Yes, occasionally   []  No Numbness of the feet? []  Yes    []  No Retinopathy hx? [x]  Yes Rt eye.  Received laser treatment in 05/2018    []  No Last eye exam:  07/2018 Dr. Ethelene Browns at [facility] Comments:    HYPERTENSION/Diastolic dysfunction Currently taking: see medication list. Med Adherence: [x]  Yes    []  No Medication side effects: []  Yes    [x]  No Adherence with salt restriction: [x]  Yes    []  No Home  Monitoring?: [x]  Yes    []  No Monitoring Frequency: once a day Home BP results range: 120-132/70 SOB? []  Yes    [x]  No Chest Pain?: []  Yes    [x]  No Leg swelling?: []  Yes    [x]  No Headaches?: []  Yes    []  No Dizziness? []  Yes    []  No Comments:   Wants thyroid checked.  She had biopsy done on left-sided thyroid nodule in 2016.  Pathology was benign.  She feels that she needs to have it checked again.  She denies any compressive symptoms but states that sometimes when she swallows she thinks she can feel the nodule on the left side.  Would like to have study done to recheck complex cyst that was seen on the left kidney back in 2017.  She denies any chronic left-sided flank pain.  No hematuria.  Complains of an itchy painful rash that appeared on her left side 3 days ago.  She was unsure whether it was shingles.  She has been applying alcohol to it.  Looks like it has scabbed over  DDD of cervical spine: She states that she no longer sees Dr. Letta Pate.  She is followed by [facility].. Patient Active Problem List   Diagnosis Date Noted  . Cervical myelopathy ([place]) 08/16/2018  . Diabetic retinopathy of right eye associated with type  2 diabetes mellitus ([place]) 08/16/2018  . Upper airway cough syndrome 05/03/2018  . Morbid obesity due to excess calories ([place]) complicated by hbp/ dm / hyperlipidemia 09/10/2017  . Cough variant asthma vs UACS/vcd on ACEi  09/09/2017  . Anxiety and depression 09/06/2017  . Mild persistent asthma without complication 88/89/1694  . Paranoia ([place]) 09/06/2017  . Torn left ear lobe 11/20/2016  . Chronic migraine w/o aura w/o status migrainosus, not intractable 06/27/2016  . Post-menopausal atrophic vaginitis 11/14/2015  . Primary osteoarthritis of both knees 07/18/2015  . GERD (gastroesophageal reflux disease) 05/08/2015  . Adenomatous polyp of colon 03/26/2015  . Essential hypertension 02/12/2015  . Cognitive deficit due to old head trauma 02/12/2015  .  Asthma, chronic 02/12/2015  . Chronic left shoulder pain 02/11/2015  . Thyroid nodule 12/18/2014  . Chronic neck pain   . HLD (hyperlipidemia)   . OSA treated with BiPAP 11/27/2014  . Obesity hypoventilation syndrome ([place]) 11/27/2014  . Demand ischemia ([place])   . Diabetes type 2, uncontrolled ([place])   . Cardiomegaly 11/25/2014  . CHF (congestive heart failure) ([place]) 11/25/2014  . DM type 2 causing CKD stage 2 ([place]) 11/25/2014     Current Outpatient Medications on File Prior to Visit  Medication Sig Dispense Refill  . ACCU-CHEK FASTCLIX LANCETS MISC Use as directed to test blood sugar three times daily DX E11.65 100 each 12  . Acetaminophen-Codeine 300-30 MG tablet Take 1 tablet by mouth 2 (two) times daily. 60 tablet 0  . albuterol (PROVENTIL HFA;VENTOLIN HFA) 108 (90 Base) MCG/ACT inhaler INHALE 2 PUFFS INTO THE LUNGS EVERY 6 HOURS AS NEEDED FOR WHEEZING OR SHORTNESS OF BREATH 18 g 1  . albuterol (PROVENTIL) (2.5 MG/3ML) 0.083% nebulizer solution USE 1 VIAL VIA NEBULIZER EVERY 6 HOURS AS NEEDED FOR WHEEZING OR SHORTNESS OF BREATH 25 vial 0  . Ascorbic Acid (VITAMIN C PO) Take 1 tablet by mouth daily.    Marland Kitchen aspirin 81 MG chewable tablet Chew 81 mg by mouth daily.     Marland Kitchen atenolol (TENORMIN) 25 MG tablet TAKE 1 TABLET(25 MG) BY MOUTH DAILY 30 tablet 0  . atorvastatin (LIPITOR) 40 MG tablet Take 1 tablet by mouth daily at 6pm 90 tablet 1  . Blood Glucose Monitoring Suppl (ACCU-CHEK GUIDE) w/Device KIT 1 each by Does not apply route 3 (three) times daily. Use as directed to test blood sugar three times daily DX E11.65 1 kit 0  . CALCIUM 500/D 500-200 MG-UNIT tablet TAKE 1 TABLET BY MOUTH DAILY 30 tablet 2  . cetirizine (ZYRTEC) 10 MG tablet Take 1 tablet (10 mg total) by mouth daily. 30 tablet 0  . fluticasone (FLONASE) 50 MCG/ACT nasal spray Place 1 spray into both nostrils daily. 16 g 0  . glucose blood (ACCU-CHEK GUIDE) test strip Use as directed to test blood sugar three times daily DX E11.65  100 each 12  . guaiFENesin (MUCINEX PO) Take by mouth.    . hydrochlorothiazide (HYDRODIURIL) 25 MG tablet Take 1 tablet (25 mg total) by mouth daily. 90 tablet 3  . insulin aspart protamine- aspart (NOVOLOG MIX 70/30) (70-30) 100 UNIT/ML injection 80 units subcutaneous in the a.m. and 50 units in the evenings with meals 30 mL 11  . INSULIN SYRINGE 1CC/29G 29G X 1/2" 1 ML MISC USE THREE TIMES DAILY AS DIRECTED 100 each 3  . losartan (COZAAR) 50 MG tablet TAKE 1 TABLET(50 MG) BY MOUTH DAILY 30 tablet 0  . montelukast (SINGULAIR) 10 MG tablet TAKE 1 TABLET(10  MG) BY MOUTH DAILY 90 tablet 1  . Multiple Vitamin (MULTIVITAMIN WITH MINERALS) TABS tablet Take 1 tablet by mouth daily.    . nitroGLYCERIN (NITROSTAT) 0.3 MG SL tablet Place 1 tablet (0.3 mg total) under the tongue every 5 (five) minutes as needed for chest pain. Max 3 tablets in 15 minutes 90 tablet 0  . nizatidine (AXID) 150 MG capsule Take 1 capsule (150 mg total) by mouth at bedtime. 30 capsule 5  . pantoprazole (PROTONIX) 40 MG tablet TAKE 1 TABLET(40 MG) BY MOUTH DAILY 30 TO 60 MINUTES BEFORE FIRST MEAL OF THE DAY 30 tablet 5  . Potassium Chloride ER 20 MEQ TBCR Take 20 mEq by mouth daily. 30 tablet 6  . SYMBICORT 80-4.5 MCG/ACT inhaler INHALE 2 PUFFS INTO THE LUNGS TWICE DAILY 10.2 g 2  . topiramate (TOPAMAX) 25 MG capsule Take 3 capsules (75 mg total) by mouth 2 (two) times daily. Please call and schedule a regular office visit for follow-up. 180 capsule 1   No current facility-administered medications on file prior to visit.     No Known Allergies  Social History   Socioeconomic History  . Marital status: Legally Separated    Spouse name: Not on file  . Number of children: 4  . Years of education: Not on file  . Highest education level: Not on file  Occupational History  . Occupation: Disabled  Social Needs  . Financial resource strain: Not on file  . Food insecurity:    Worry: Not on file    Inability: Not on file  .  Transportation needs:    Medical: Not on file    Non-medical: Not on file  Tobacco Use  . Smoking status: Never Smoker  . Smokeless tobacco: Never Used  Substance and Sexual Activity  . Alcohol use: No  . Drug use: No  . Sexual activity: Not on file  Lifestyle  . Physical activity:    Days per week: Not on file    Minutes per session: Not on file  . Stress: Not on file  Relationships  . Social connections:    Talks on phone: Not on file    Gets together: Not on file    Attends religious service: Not on file    Active member of club or organization: Not on file    Attends meetings of clubs or organizations: Not on file    Relationship status: Not on file  . Intimate partner violence:    Fear of current or ex partner: Not on file    Emotionally abused: Not on file    Physically abused: Not on file    Forced sexual activity: Not on file  Other Topics Concern  . Not on file  Social History Narrative   Lives with daughter   Caffeine use: 1 cup coffee per day    Family History  Problem Relation Age of Onset  . Colon cancer Father   . Diabetes type II Sister   . Diabetes type II Brother   . Colon cancer Sister   . Migraines Neg Hx   . Breast cancer Neg Hx     Past Surgical History:  Procedure Laterality Date  . ABDOMINAL HYSTERECTOMY    . CATARACT EXTRACTION Left   . CATARACT EXTRACTION     LT 01/2017, 02/2017 on RT  . CESAREAN SECTION      ROS: Review of Systems Negative except as stated above  PHYSICAL EXAM: BP 133/80   Pulse 91  Temp 98.4 F (36.9 C) (Oral)   Resp 16   Ht 5' 4"  (1.626 m)   Wt 238 lb 3.2 oz (108 kg)   SpO2 95%   BMI 40.89 kg/m   Physical Exam  General appearance - alert, well appearing, and in no distress Mental status -patient is oriented.  She has a very odd affect. Mouth - mucous membranes moist, pharynx normal without lesions Neck - supple, no significant adenopathy.no thyroid enlargement or thyroid nodules palpated. Chest -  clear to auscultation, no wheezes, rales or rhonchi, symmetric air entry Heart - normal rate, regular rhythm, normal S1, S2, no murmurs, rubs, clicks or gallops Extremities -no lower extremity edema Skin: About a 4 cm area of excoriation versus scabbed over vesicles on the left lower flank CMP Latest Ref Rng & Units 05/10/2018 01/27/2018 01/27/2018  Glucose 65 - 99 mg/dL 116(H) 99 99  BUN 8 - 27 mg/dL 18 20 20   Creatinine 0.57 - 1.00 mg/dL 1.52(H) 1.30(H) 1.24(H)  Sodium 134 - 144 mmol/L 142 141 142  Potassium 3.5 - 5.2 mmol/L 3.5 3.2(L) 3.3(L)  Chloride 96 - 106 mmol/L 98 103 106  CO2 20 - 29 mmol/L 26 - 26  Calcium 8.7 - 10.3 mg/dL 10.1 - 9.6  Total Protein 6.0 - 8.5 g/dL 6.7 - 6.8  Total Bilirubin 0.0 - 1.2 mg/dL <0.2 - 0.5  Alkaline Phos 39 - 117 IU/L 117 - 93  AST 0 - 40 IU/L 25 - 26  ALT 0 - 32 IU/L 32 - 31   Lipid Panel     Component Value Date/Time   CHOL 113 05/10/2018 0840   TRIG 111 05/10/2018 0840   HDL 54 05/10/2018 0840   CHOLHDL 2.1 05/10/2018 0840   CHOLHDL 2.1 08/11/2016 1109   VLDL 28 08/11/2016 1109   LDLCALC 37 05/10/2018 0840    CBC    Component Value Date/Time   WBC 8.4 01/27/2018 1854   RBC 4.70 01/27/2018 1854   HGB 13.9 01/27/2018 1913   HGB 13.2 08/23/2017 1655   HCT 41.0 01/27/2018 1913   HCT 38.8 08/23/2017 1655   PLT 217 01/27/2018 1854   MCV 87.0 01/27/2018 1854   MCV 83 08/23/2017 1655   MCH 27.4 01/27/2018 1854   MCHC 31.5 01/27/2018 1854   RDW 14.7 01/27/2018 1854   RDW 15.0 08/23/2017 1655   LYMPHSABS 3.4 01/27/2018 1854   LYMPHSABS 4.3 (H) 08/23/2017 1655   MONOABS 0.7 01/27/2018 1854   EOSABS 0.3 01/27/2018 1854   EOSABS 0.4 08/23/2017 1655   BASOSABS 0.1 01/27/2018 1854   BASOSABS 0.1 08/23/2017 1655   Results for orders placed or performed in visit on 08/16/18  POCT glucose (manual entry)  Result Value Ref Range   POC Glucose 137 (A) 70 - 99 mg/dl    ASSESSMENT AND PLAN:  1. Controlled type 2 diabetes mellitus with  retinopathy of right eye, with long-term current use of insulin, macular edema presence unspecified, unspecified retinopathy severity ([place]) Continue current dose of insulin [place] has been discontinued Continue healthy eating habits and regular exercise - POCT glucose (manual entry) - Hemoglobin A1c  2. Morbid obesity due to excess calories ([place]) See #1 above  3. Essential hypertension Close to goal.  Continue current medications  4. CKD (chronic kidney disease) stage 3, GFR 30-59 ml/min (HCC) Avoid NSAIDs.  Will recheck BMP on next visit.  5. Chronic diastolic congestive heart failure (HCC) Stable and asymptomatic.  6. Left thyroid nodule Patient feels that she  needs to have another biopsy done.  I have told her that we can recheck the thyroid ultrasound and if nodule has remained unchanged there will be no need for another biopsy - US THYROID; Future - TSH  7. Renal cyst - US RENAL; Future  8. Dermatitis Not sure whether this was shingles or not having not seen it from the beginning.  In any event and we can discuss giving her shingles vaccine in about 4 to 6 months.  9. Cervical myelopathy ([place]) Followed by Ortho.    Patient was given the opportunity to ask questions.  Patient verbalized understanding of the plan and was able to repeat key elements of the plan.   Orders Placed This Encounter  Procedures  . US RENAL  . US THYROID  . Hemoglobin A1c  . TSH  . POCT glucose (manual entry)     Requested Prescriptions    No prescriptions requested or ordered in this encounter    Return in about 4 months (around 12/15/2018).  Karle Plumber, MD, FACP

## 2018-08-17 ENCOUNTER — Telehealth: Payer: Self-pay | Admitting: Internal Medicine

## 2018-08-17 ENCOUNTER — Other Ambulatory Visit: Payer: Self-pay | Admitting: Internal Medicine

## 2018-08-17 LAB — TSH: TSH: 1.27 u[IU]/mL (ref 0.450–4.500)

## 2018-08-17 LAB — HEMOGLOBIN A1C
Est. average glucose Bld gHb Est-mCnc: 223 mg/dL
Hgb A1c MFr Bld: 9.4 % — ABNORMAL HIGH (ref 4.8–5.6)

## 2018-08-17 MED ORDER — GABAPENTIN 300 MG PO CAPS: 300.0000 mg | ORAL_CAPSULE | Freq: Every day | ORAL | 0 refills | Status: DC

## 2018-08-17 MED ORDER — SITAGLIPTIN PHOSPHATE 50 MG PO TABS: 50.0000 mg | ORAL_TABLET | Freq: Every day | ORAL | 3 refills | Status: DC

## 2018-08-17 NOTE — Telephone Encounter (Signed)
Patient called in regards to the rash on her back. She says she has been filling chills and states that the rash is painful and itchy.  Also reports that she  has a boil on the back of her leg.  Patient believes she has an infection and would like to get prescribed antibiotics. Please follow up.

## 2018-08-19 NOTE — Telephone Encounter (Signed)
Contacted pt to go over lab results pt is aware   Raynelle Fanning could you schedule pt and appointment with Dr. Wynetta Emery or angela for an urgent care visit

## 2018-08-19 NOTE — Telephone Encounter (Signed)
Patient will be in Monday at 10:50

## 2018-08-22 ENCOUNTER — Ambulatory Visit: Payer: Medicare HMO | Attending: Internal Medicine | Admitting: Internal Medicine

## 2018-08-22 ENCOUNTER — Encounter: Payer: Self-pay | Admitting: Internal Medicine

## 2018-08-22 ENCOUNTER — Telehealth: Payer: Self-pay

## 2018-08-22 VITALS — BP 146/78 | HR 82 | Temp 98.3°F | Resp 16 | Wt 237.6 lb

## 2018-08-22 DIAGNOSIS — N183 Chronic kidney disease, stage 3 unspecified: Secondary | ICD-10-CM

## 2018-08-22 DIAGNOSIS — R05 Cough: Secondary | ICD-10-CM

## 2018-08-22 DIAGNOSIS — E118 Type 2 diabetes mellitus with unspecified complications: Secondary | ICD-10-CM

## 2018-08-22 DIAGNOSIS — E119 Type 2 diabetes mellitus without complications: Secondary | ICD-10-CM

## 2018-08-22 DIAGNOSIS — Z6841 Body Mass Index (BMI) 40.0 and over, adult: Secondary | ICD-10-CM

## 2018-08-22 DIAGNOSIS — L309 Dermatitis, unspecified: Secondary | ICD-10-CM

## 2018-08-22 DIAGNOSIS — L308 Other specified dermatitis: Secondary | ICD-10-CM

## 2018-08-22 DIAGNOSIS — E669 Obesity, unspecified: Secondary | ICD-10-CM

## 2018-08-22 DIAGNOSIS — E1165 Type 2 diabetes mellitus with hyperglycemia: Secondary | ICD-10-CM

## 2018-08-22 DIAGNOSIS — R059 Cough, unspecified: Secondary | ICD-10-CM

## 2018-08-22 DIAGNOSIS — IMO0002 Reserved for concepts with insufficient information to code with codable children: Secondary | ICD-10-CM

## 2018-08-22 NOTE — Telephone Encounter (Signed)
[place] Medicare to do prior auth for procedures  CPT- 76536 US Thyroid           [phone] US Renal   ICD- E04.1 left Thyroid Nodule          N28.1 Renal Cyst     Went through the prompts and per automated system pt insurance doesn't require prior auth for procedures

## 2018-08-22 NOTE — Patient Instructions (Signed)
Do not take the Gabapentin.   Try to get in some form of aerobic exercise at least 3 days a week.  Try to build up to 30 minutes a session.

## 2018-08-22 NOTE — Progress Notes (Signed)
Patient ID: Heidi [name], female    DOB: 1951-01-15  MRN: [id number]  CC: Rash   Subjective: Heidi [name] is a 68 y.o. female who presents for UC visit. Her concerns today include:  68 year old female with history of HTN, CHF, migraines([facility] on Botox inj),OSA (noton BiPAP),upper airway cough syndrome,GERD, diabetes2, CKD stage3, OA knees,DDD of cervical spine (followed by [facility])obesity, HL,  Pt reports that she had a boil below RT buttock last wk. She used rubbing alcohol on it I and "it went in."  She also wanted to follow-up on the rash on her left side.  He has been using alcohol on it also and the pain is resolved.  She purchased some cortisone 10 cream over-the-counter and used it for the itching.  This helped.  She has not had any itching in the past 2 days.  I had called in a 1 month supply of gabapentin for her to use for the pain associated with this rash.  Patient states that she picked up the prescription from the pharmacy but she did not take it because she read that it is a seizure medicine and she is on Topamax through her orthopedics  She wonders whether she needs some antibiotic because she coughed up a lot of phlegm 2 days ago.  Cough was triggered when she drank a little bit of alcohol that had a cinnamon flavor to it.  She has not had any shortness of breath, congestion.  She states that she has episodes where her body feels hot and then cold.  I went over her recent labs.  Her A1c was 9.4.  I would recommend adding Januvia.  Patient states that she has picked up the medicine already and has started taking it.  I inquired about whether she has Silver sneakers through her insurance.  Patient states she will check.  If she does not she plans to start going to a senior citizens gym that is down the street from her house.  She states the cost is $10 a month which she feels she would be able to afford.  Patient Active Problem List   Diagnosis  Date Noted  . Cervical myelopathy ([place]) 08/16/2018  . Diabetic retinopathy of right eye associated with type 2 diabetes mellitus ([place]) 08/16/2018  . Upper airway cough syndrome 05/03/2018  . Morbid obesity due to excess calories ([place]) complicated by hbp/ dm / hyperlipidemia 09/10/2017  . Cough variant asthma vs UACS/vcd on ACEi  09/09/2017  . Anxiety and depression 09/06/2017  . Mild persistent asthma without complication 41/96/2229  . Paranoia ([place]) 09/06/2017  . Torn left ear lobe 11/20/2016  . Chronic migraine w/o aura w/o status migrainosus, not intractable 06/27/2016  . Post-menopausal atrophic vaginitis 11/14/2015  . Primary osteoarthritis of both knees 07/18/2015  . GERD (gastroesophageal reflux disease) 05/08/2015  . Renal cyst 05/08/2015  . Adenomatous polyp of colon 03/26/2015  . Essential hypertension 02/12/2015  . Cognitive deficit due to old head trauma 02/12/2015  . Asthma, chronic 02/12/2015  . Chronic left shoulder pain 02/11/2015  . Thyroid nodule 12/18/2014  . Chronic neck pain   . HLD (hyperlipidemia)   . OSA treated with BiPAP 11/27/2014  . Obesity hypoventilation syndrome ([place]) 11/27/2014  . Demand ischemia ([place])   . Diabetes type 2, uncontrolled ([place])   . Cardiomegaly 11/25/2014  . CHF (congestive heart failure) ([place]) 11/25/2014  . DM type 2 causing CKD stage 2 ([place]) 11/25/2014     Current Outpatient Medications on  File Prior to Visit  Medication Sig Dispense Refill  . ACCU-CHEK FASTCLIX LANCETS MISC Use as directed to test blood sugar three times daily DX E11.65 100 each 12  . albuterol (PROVENTIL HFA;VENTOLIN HFA) 108 (90 Base) MCG/ACT inhaler INHALE 2 PUFFS INTO THE LUNGS EVERY 6 HOURS AS NEEDED FOR WHEEZING OR SHORTNESS OF BREATH 18 g 1  . albuterol (PROVENTIL) (2.5 MG/3ML) 0.083% nebulizer solution USE 1 VIAL VIA NEBULIZER EVERY 6 HOURS AS NEEDED FOR WHEEZING OR SHORTNESS OF BREATH 25 vial 0  . Ascorbic Acid (VITAMIN C PO) Take 1 tablet by mouth daily.     Marland Kitchen aspirin 81 MG chewable tablet Chew 81 mg by mouth daily.     Marland Kitchen atenolol (TENORMIN) 25 MG tablet TAKE 1 TABLET(25 MG) BY MOUTH DAILY 30 tablet 0  . atorvastatin (LIPITOR) 40 MG tablet Take 1 tablet by mouth daily at 6pm 90 tablet 1  . Blood Glucose Monitoring Suppl (ACCU-CHEK GUIDE) w/Device KIT 1 each by Does not apply route 3 (three) times daily. Use as directed to test blood sugar three times daily DX E11.65 1 kit 0  . CALCIUM 500/D 500-200 MG-UNIT tablet TAKE 1 TABLET BY MOUTH DAILY 30 tablet 2  . cetirizine (ZYRTEC) 10 MG tablet Take 1 tablet (10 mg total) by mouth daily. 30 tablet 0  . glucose blood (ACCU-CHEK GUIDE) test strip Use as directed to test blood sugar three times daily DX E11.65 100 each 12  . guaiFENesin (MUCINEX PO) Take by mouth.    . hydrochlorothiazide (HYDRODIURIL) 25 MG tablet Take 1 tablet (25 mg total) by mouth daily. 90 tablet 3  . insulin aspart protamine- aspart (NOVOLOG MIX 70/30) (70-30) 100 UNIT/ML injection 80 units subcutaneous in the a.m. and 50 units in the evenings with meals 30 mL 11  . INSULIN SYRINGE 1CC/29G 29G X 1/2" 1 ML MISC USE THREE TIMES DAILY AS DIRECTED 100 each 3  . losartan (COZAAR) 50 MG tablet TAKE 1 TABLET(50 MG) BY MOUTH DAILY 30 tablet 0  . montelukast (SINGULAIR) 10 MG tablet TAKE 1 TABLET(10 MG) BY MOUTH DAILY 90 tablet 1  . Multiple Vitamin (MULTIVITAMIN WITH MINERALS) TABS tablet Take 1 tablet by mouth daily.    . nitroGLYCERIN (NITROSTAT) 0.3 MG SL tablet Place 1 tablet (0.3 mg total) under the tongue every 5 (five) minutes as needed for chest pain. Max 3 tablets in 15 minutes 90 tablet 0  . nizatidine (AXID) 150 MG capsule Take 1 capsule (150 mg total) by mouth at bedtime. 30 capsule 5  . pantoprazole (PROTONIX) 40 MG tablet TAKE 1 TABLET(40 MG) BY MOUTH DAILY 30 TO 60 MINUTES BEFORE FIRST MEAL OF THE DAY 30 tablet 5  . Potassium Chloride ER 20 MEQ TBCR Take 20 mEq by mouth daily. 30 tablet 6  . sitaGLIPtin (JANUVIA) 50 MG tablet  Take 1 tablet (50 mg total) by mouth daily. 30 tablet 3  . SYMBICORT 80-4.5 MCG/ACT inhaler INHALE 2 PUFFS INTO THE LUNGS TWICE DAILY 10.2 g 2  . topiramate (TOPAMAX) 25 MG capsule Take 3 capsules (75 mg total) by mouth 2 (two) times daily. Please call and schedule a regular office visit for follow-up. 180 capsule 1   No current facility-administered medications on file prior to visit.     No Known Allergies  Social History   Socioeconomic History  . Marital status: Legally Separated    Spouse name: Not on file  . Number of children: 4  . Years of education: Not on  file  . Highest education level: Not on file  Occupational History  . Occupation: Disabled  Social Needs  . Financial resource strain: Not on file  . Food insecurity:    Worry: Not on file    Inability: Not on file  . Transportation needs:    Medical: Not on file    Non-medical: Not on file  Tobacco Use  . Smoking status: Never Smoker  . Smokeless tobacco: Never Used  Substance and Sexual Activity  . Alcohol use: No  . Drug use: No  . Sexual activity: Not on file  Lifestyle  . Physical activity:    Days per week: Not on file    Minutes per session: Not on file  . Stress: Not on file  Relationships  . Social connections:    Talks on phone: Not on file    Gets together: Not on file    Attends religious service: Not on file    Active member of club or organization: Not on file    Attends meetings of clubs or organizations: Not on file    Relationship status: Not on file  . Intimate partner violence:    Fear of current or ex partner: Not on file    Emotionally abused: Not on file    Physically abused: Not on file    Forced sexual activity: Not on file  Other Topics Concern  . Not on file  Social History Narrative   Lives with daughter   Caffeine use: 1 cup coffee per day    Family History  Problem Relation Age of Onset  . Colon cancer Father   . Diabetes type II Sister   . Diabetes type II Brother    . Colon cancer Sister   . Migraines Neg Hx   . Breast cancer Neg Hx     Past Surgical History:  Procedure Laterality Date  . ABDOMINAL HYSTERECTOMY    . CATARACT EXTRACTION Left   . CATARACT EXTRACTION     LT 01/2017, 02/2017 on RT  . CESAREAN SECTION      ROS: Review of Systems Negative except as stated above  PHYSICAL EXAM: BP (!) 146/78   Pulse 82   Temp 98.3 F (36.8 C) (Oral)   Resp 16   Wt 237 lb 9.6 oz (107.8 kg)   SpO2 96%   BMI 40.78 kg/m   Physical Exam  General appearance - alert, well appearing, and in no distress Mental status -again patient with very odd affect Mouth - mucous membranes moist, pharynx normal without lesions Chest - clear to auscultation, no wheezes, rales or rhonchi, symmetric air entry Heart - normal rate, regular rhythm, normal S1, S2, no murmurs, rubs, clicks or gallops Skin -small patch of rash that was noted on the lower part of the left flank on last visit has healed over living to small hyperpigmented areas.  The area is not tender to touch. No boil noted at this time below the right buttock Results for orders placed or performed in visit on 08/16/18  Hemoglobin A1c  Result Value Ref Range   Hgb A1c MFr Bld 9.4 (H) 4.8 - 5.6 %   Est. average glucose Bld gHb Est-mCnc 223 mg/dL  TSH  Result Value Ref Range   TSH 1.270 0.450 - 4.500 uIU/mL  POCT glucose (manual entry)  Result Value Ref Range   POC Glucose 137 (A) 70 - 99 mg/dl    CMP Latest Ref Rng & Units 05/10/2018 01/27/2018 01/27/2018  Glucose 65 - 99 mg/dL 116(H) 99 99  BUN 8 - 27 mg/dL 18 20 20   Creatinine 0.57 - 1.00 mg/dL 1.52(H) 1.30(H) 1.24(H)  Sodium 134 - 144 mmol/L 142 141 142  Potassium 3.5 - 5.2 mmol/L 3.5 3.2(L) 3.3(L)  Chloride 96 - 106 mmol/L 98 103 106  CO2 20 - 29 mmol/L 26 - 26  Calcium 8.7 - 10.3 mg/dL 10.1 - 9.6  Total Protein 6.0 - 8.5 g/dL 6.7 - 6.8  Total Bilirubin 0.0 - 1.2 mg/dL <0.2 - 0.5  Alkaline Phos 39 - 117 IU/L 117 - 93  AST 0 - 40 IU/L 25  - 26  ALT 0 - 32 IU/L 32 - 31   Lipid Panel     Component Value Date/Time   CHOL 113 05/10/2018 0840   TRIG 111 05/10/2018 0840   HDL 54 05/10/2018 0840   CHOLHDL 2.1 05/10/2018 0840   CHOLHDL 2.1 08/11/2016 1109   VLDL 28 08/11/2016 1109   LDLCALC 37 05/10/2018 0840    CBC    Component Value Date/Time   WBC 8.4 01/27/2018 1854   RBC 4.70 01/27/2018 1854   HGB 13.9 01/27/2018 1913   HGB 13.2 08/23/2017 1655   HCT 41.0 01/27/2018 1913   HCT 38.8 08/23/2017 1655   PLT 217 01/27/2018 1854   MCV 87.0 01/27/2018 1854   MCV 83 08/23/2017 1655   MCH 27.4 01/27/2018 1854   MCHC 31.5 01/27/2018 1854   RDW 14.7 01/27/2018 1854   RDW 15.0 08/23/2017 1655   LYMPHSABS 3.4 01/27/2018 1854   LYMPHSABS 4.3 (H) 08/23/2017 1655   MONOABS 0.7 01/27/2018 1854   EOSABS 0.3 01/27/2018 1854   EOSABS 0.4 08/23/2017 1655   BASOSABS 0.1 01/27/2018 1854   BASOSABS 0.1 08/23/2017 1655    ASSESSMENT AND PLAN:  1. Dermatitis Resolving.  I told patient to disregard taking the gabapentin  2. Cough No need for antibiotics at this time based on history and exam  3. CKD (chronic kidney disease) stage 3, GFR 30-59 ml/min (H[facility]) We will plan to recheck a BMP on this visit - Basic Metabolic Panel  4. Diabetes mellitus type 2, uncontrolled, with complications ([place]) -Again encouraged healthy eating habits.  Encouraged her to try to get in some form of aerobic exercise at least 3 days a week with goal of eventually getting up to 30 minutes per session.  She plans to find out whether her insurance offers the Silver sneakers program and if not she plans to join the gym at the senior center that is close to her house.  Januvia was added.  Patient was given the opportunity to ask questions.  Patient verbalized understanding of the plan and was able to repeat key elements of the plan.   Orders Placed This Encounter  Procedures  . Basic Metabolic Panel     Requested Prescriptions    No prescriptions  requested or ordered in this encounter    No follow-ups on file.  Karle Plumber, MD, FACP

## 2018-08-23 LAB — BASIC METABOLIC PANEL
BUN/Creatinine Ratio: 16 (ref 12–28)
BUN: 18 mg/dL (ref 8–27)
CO2: 22 mmol/L (ref 20–29)
Calcium: 9.8 mg/dL (ref 8.7–10.3)
Chloride: 102 mmol/L (ref 96–106)
Creatinine, Ser: 1.16 mg/dL — ABNORMAL HIGH (ref 0.57–1.00)
GFR calc Af Amer: 57 mL/min/{1.73_m2} — ABNORMAL LOW (ref >59)
GFR, EST NON AFRICAN AMERICAN: 49 mL/min/{1.73_m2} — AB (ref >59)
Glucose: 169 mg/dL — ABNORMAL HIGH (ref 65–99)
Potassium: 3.9 mmol/L (ref 3.5–5.2)
Sodium: 141 mmol/L (ref 134–144)

## 2018-08-25 ENCOUNTER — Ambulatory Visit (HOSPITAL_COMMUNITY): Payer: Medicare HMO

## 2018-08-25 ENCOUNTER — Telehealth: Payer: Self-pay

## 2018-08-25 NOTE — Telephone Encounter (Signed)
Pt returned call went over lab results pt doesn't have any questions or concerns

## 2018-08-25 NOTE — Telephone Encounter (Signed)
Contacted pt to go over lab results pt didn't answer was unable to lvm due to vm being full  

## 2018-09-04 ENCOUNTER — Other Ambulatory Visit: Payer: Self-pay | Admitting: Internal Medicine

## 2018-09-04 DIAGNOSIS — I10 Essential (primary) hypertension: Secondary | ICD-10-CM

## 2018-09-04 DIAGNOSIS — I5033 Acute on chronic diastolic (congestive) heart failure: Secondary | ICD-10-CM

## 2018-09-05 ENCOUNTER — Other Ambulatory Visit: Payer: Self-pay | Admitting: Internal Medicine

## 2018-09-07 ENCOUNTER — Other Ambulatory Visit (HOSPITAL_COMMUNITY): Payer: Medicare HMO

## 2018-09-07 ENCOUNTER — Ambulatory Visit (HOSPITAL_COMMUNITY): Payer: Medicare HMO

## 2018-09-22 ENCOUNTER — Other Ambulatory Visit: Payer: Self-pay | Admitting: Internal Medicine

## 2018-09-22 DIAGNOSIS — R058 Other specified cough: Secondary | ICD-10-CM

## 2018-09-22 DIAGNOSIS — IMO0002 Reserved for concepts with insufficient information to code with codable children: Secondary | ICD-10-CM

## 2018-09-22 DIAGNOSIS — R05 Cough: Secondary | ICD-10-CM

## 2018-09-22 DIAGNOSIS — E1165 Type 2 diabetes mellitus with hyperglycemia: Secondary | ICD-10-CM

## 2018-09-22 NOTE — Telephone Encounter (Signed)
1) Medication(s) Requested (by name): hydrochlorothiazide (HYDRODIURIL) 25 MG tablet atorvastatin (LIPITOR) 40 MG tablet losartan (COZAAR) 50 MG tablet  pantoprazole (PROTONIX) 40 MG tablet  nizatidine (AXID) 150 MG capsule  INSULIN SYRINGE 1CC/29G 29G X 1/2" 1 ML MISC  insulin aspart protamine- aspart (NOVOLOG MIX 70/30) (70-30) 100 UNIT/ML injection   2) Pharmacy of Choice: [facility] #[id number] - , [place] - [address] AT [place]  3) Special Requests: Pt would like a 3 month supply on meds to take precautions over the covid-19 states that her insurance already approved of it   Approved medications will be sent to the pharmacy, we will reach out if there is an issue.  Requests made after 3pm may not be addressed until the following business day!  If a patient is unsure of the name of the medication(s) please note and ask patient to call back when they are able to provide all info, do not send to responsible party until all information is available!

## 2018-09-24 ENCOUNTER — Other Ambulatory Visit: Payer: Self-pay | Admitting: Internal Medicine

## 2018-09-24 DIAGNOSIS — E785 Hyperlipidemia, unspecified: Secondary | ICD-10-CM

## 2018-09-26 MED ORDER — LOSARTAN POTASSIUM 50 MG PO TABS: ORAL_TABLET | ORAL | 7 refills | Status: DC

## 2018-09-26 MED ORDER — PANTOPRAZOLE SODIUM 40 MG PO TBEC: 40.0000 mg | DELAYED_RELEASE_TABLET | Freq: Every day | ORAL | 5 refills | Status: DC

## 2018-09-26 MED ORDER — NIZATIDINE 150 MG PO CAPS: 150.0000 mg | ORAL_CAPSULE | Freq: Every day | ORAL | 2 refills | Status: DC

## 2018-09-26 MED ORDER — "INSULIN SYRINGE 29G X 1/2"" 1 ML MISC": 3 refills | Status: DC

## 2018-09-26 NOTE — Telephone Encounter (Signed)
Many of these prescriptions should have plenty of refills available at the pharmacy. The ones that she should be out of will be sent once approved by PCP.

## 2018-09-30 ENCOUNTER — Other Ambulatory Visit: Payer: Self-pay | Admitting: Internal Medicine

## 2018-09-30 ENCOUNTER — Telehealth: Payer: Self-pay | Admitting: Internal Medicine

## 2018-09-30 DIAGNOSIS — J4530 Mild persistent asthma, uncomplicated: Secondary | ICD-10-CM

## 2018-09-30 NOTE — Telephone Encounter (Signed)
Patient called to request a refill on their medication, they were instructed to hang up and contact their pharmacy directly to order their prescriptions even if they are out of refills.  

## 2018-10-05 ENCOUNTER — Telehealth: Payer: Self-pay | Admitting: Internal Medicine

## 2018-10-05 MED ORDER — FAMOTIDINE 20 MG PO TABS: 20.0000 mg | ORAL_TABLET | Freq: Every day | ORAL | 3 refills | Status: DC

## 2018-10-05 NOTE — Telephone Encounter (Signed)
Received message from [facility] stating Axid is on back oder without a release date.  Changed to Pepcid.

## 2018-10-13 ENCOUNTER — Telehealth: Payer: Self-pay | Admitting: Internal Medicine

## 2018-10-13 MED ORDER — MOMETASONE FURO-FORMOTEROL FUM 100-5 MCG/ACT IN AERO: 2.0000 | INHALATION_SPRAY | Freq: Two times a day (BID) | RESPIRATORY_TRACT | 2 refills | Status: DC

## 2018-10-13 NOTE — Telephone Encounter (Signed)
-----   Message from Tresa Endo, RPH-CPP sent at 10/12/2018  1:38 PM EDT ----- Dr. Wynetta Emery -   Received the message regarding Symbicort coverage for this patient. If you are willing, we can switch to the 200/5 mcg [facility] HFA.

## 2018-10-19 ENCOUNTER — Telehealth: Payer: Self-pay | Admitting: Neurology

## 2018-10-19 NOTE — Telephone Encounter (Signed)
I called and canceled the patients apt. She would like to know if she can get a script for a rescue medication to hold her over through this time. Please call and advise.

## 2018-10-19 NOTE — Telephone Encounter (Signed)
If she has no contraindications I would start Maxalt, please ask if this is ok

## 2018-10-20 MED ORDER — RIZATRIPTAN BENZOATE 10 MG PO TABS: 10.0000 mg | ORAL_TABLET | ORAL | 2 refills | Status: DC | PRN

## 2018-10-20 NOTE — Telephone Encounter (Signed)
Noted  

## 2018-10-20 NOTE — Telephone Encounter (Signed)
If we need a PA, I would see if there are other triptans we can use instead so we dont have to do a PA. thanks

## 2018-10-20 NOTE — Telephone Encounter (Signed)
Spoke with pt and discussed Maxalt. She is agreeable to try it. She denied any h/o stroke or known vascular problems, however said that years ago she was told that she came "so close to having a heart attack". Per Dr. Jaynee Eagles, ok to use maxalt prn. Discussed with pt the directions, take 1 tablet at onset of migraine, may repeat in 2 hours if needed. Max 2 tablets per 24 hours. Also advised pt medication is limited monthly and if she uses this more than twice weekly it can cause rebound headaches. Also reviewed s/e including but not limited to feeling of tightness in the jaw, neck, or throat; heavy feeling in any part of your body, dizziness, dry mouth, drowsiness. Advised pt medication will come with detailed information. Pharmacist available for questions at pickup. Pt verbalized appreciation and understanding. Maxalt sent to [facility] on [address].

## 2018-10-26 ENCOUNTER — Ambulatory Visit: Payer: Medicare HMO | Admitting: Neurology

## 2018-11-03 ENCOUNTER — Other Ambulatory Visit: Payer: Self-pay | Admitting: Neurology

## 2018-11-03 NOTE — Telephone Encounter (Signed)
Called pt to inquire how she is taking her Topiramate. Her vm was full.

## 2018-11-03 NOTE — Telephone Encounter (Signed)
Spoke with Hydrologist. Topiramate #180 last filled 08/05/2018 and 05/20/2018.

## 2018-12-03 ENCOUNTER — Other Ambulatory Visit: Payer: Self-pay | Admitting: Internal Medicine

## 2018-12-03 DIAGNOSIS — I10 Essential (primary) hypertension: Secondary | ICD-10-CM

## 2018-12-03 DIAGNOSIS — I5033 Acute on chronic diastolic (congestive) heart failure: Secondary | ICD-10-CM

## 2018-12-05 ENCOUNTER — Telehealth: Payer: Self-pay | Admitting: Neurology

## 2018-12-05 NOTE — Telephone Encounter (Signed)
Pt states she has been suffering a bad headache for about a week and she would like to know when she can schedule a BOTOX appt. Please advise.

## 2018-12-06 NOTE — Telephone Encounter (Signed)
I spoke with the patient and let her know that we will be in touch with [facility] regarding scheduling the botox. Pt verbalized understanding. She stated the latest medication helps some (pt recently prescribed maxalt). She was advised if she doesn't hear back within a couple of days and she needs [place] to please call. She verbalized appreciation.

## 2018-12-07 NOTE — Telephone Encounter (Signed)
I called and scheduled the patient. DW  °

## 2018-12-07 NOTE — Telephone Encounter (Signed)
Noted  

## 2018-12-13 ENCOUNTER — Other Ambulatory Visit: Payer: Self-pay

## 2018-12-13 ENCOUNTER — Ambulatory Visit (INDEPENDENT_AMBULATORY_CARE_PROVIDER_SITE_OTHER): Payer: Medicare HMO | Admitting: Neurology

## 2018-12-13 VITALS — BP 143/75 | HR 92 | Temp 96.4°F

## 2018-12-13 DIAGNOSIS — G43709 Chronic migraine without aura, not intractable, without status migrainosus: Secondary | ICD-10-CM | POA: Diagnosis not present

## 2018-12-13 NOTE — Progress Notes (Signed)
Botox- 100 units x 2 vials Lot: [id number] Expiration: 08/2021 NDC: 7614-7092-95  Bacteriostatic 0.9% Sodium Chloride- 58mL total Lot: [id number] Expiration: 04/06/2019 NDC: 3709-6438-38  Dx: F84.037 B/B

## 2018-12-13 NOTE — Progress Notes (Signed)
Interval history 12/13/2018: Patient with chronic intractable migraines. Doing exceptionally well on Botox for Migraines. Prior tried multiple classes of medications which did not help with headaches or migraines until she started Botox. Since starting botox she has had  >80% improvement in frequency and severity. The migraines she does have are sparse are easier to treat and >50% less severe.Nothing has worked in the past except botox and Topiramate.  NO temples, no masseters, no LS, no eyes    Consent Form Botulism Toxin Injection For Chronic Migraine    Reviewed orally with patient, additionally signature is on file:  Botulism toxin has been approved by the Federal drug administration for treatment of chronic migraine. Botulism toxin does not cure chronic migraine and it may not be effective in some patients.  The administration of botulism toxin is accomplished by injecting a small amount of toxin into the muscles of the neck and head. Dosage must be titrated for each individual. Any benefits resulting from botulism toxin tend to wear off after 3 months with a repeat injection required if benefit is to be maintained. Injections are usually done every 3-4 months with maximum effect peak achieved by about 2 or 3 weeks. Botulism toxin is expensive and you should be sure of what costs you will incur resulting from the injection.  The side effects of botulism toxin use for chronic migraine may include:   -Transient, and usually mild, facial weakness with facial injections  -Transient, and usually mild, head or neck weakness with head/neck injections  -Reduction or loss of forehead facial animation due to forehead muscle weakness  -Eyelid drooping  -Dry eye  -Pain at the site of injection or bruising at the site of injection  -Double vision  -Potential unknown long term risks  Contraindications: You should not have Botox if you are pregnant, nursing, allergic to albumin, have an infection,  skin condition, or muscle weakness at the site of the injection, or have myasthenia gravis, Lambert-Eaton syndrome, or ALS.  It is also possible that as with any injection, there may be an allergic reaction or no effect from the medication. Reduced effectiveness after repeated injections is sometimes seen and rarely infection at the injection site may occur. All care will be taken to prevent these side effects. If therapy is given over a long time, atrophy and wasting in the muscle injected may occur. Occasionally the patient's become refractory to treatment because they develop antibodies to the toxin. In this event, therapy needs to be modified.  I have read the above information and consent to the administration of botulism toxin.    BOTOX PROCEDURE NOTE FOR MIGRAINE HEADACHE    Contraindications and precautions discussed with patient(above). Aseptic procedure was observed and patient tolerated procedure. Procedure performed by Dr. Georgia Dom  The condition has existed for more than 6 months, and pt does not have a diagnosis of ALS, Myasthenia Gravis or Lambert-Eaton Syndrome.  Risks and benefits of injections discussed and pt agrees to proceed with the procedure.  Written consent obtained  These injections are medically necessary. Pt  receives good benefits from these injections. These injections do not cause sedations or hallucinations which the oral therapies may cause.  Description of procedure:  The patient was placed in a sitting position. The standard protocol was used for Botox as follows, with 5 units of Botox injected at each site:   -Procerus muscle, midline injection  -Corrugator muscle, bilateral injection  -Frontalis muscle, bilateral injection, with 2 sites each side, medial  injection was performed in the upper one third of the frontalis muscle, in the region vertical from the medial inferior edge of the superior orbital rim. The lateral injection was again in the upper  one third of the forehead vertically above the lateral limbus of the cornea, 1.5 cm lateral to the medial injection site.  - Levator Scapulae: 5 units bilaterally  -Temporalis muscle injection, 5 sites, bilaterally. The first injection was 3 cm above the tragus of the ear, second injection site was 1.5 cm to 3 cm up from the first injection site in line with the tragus of the ear. The third injection site was 1.5-3 cm forward between the first 2 injection sites. The fourth injection site was 1.5 cm posterior to the second injection site.  -Occipitalis muscle injection, 3 sites, bilaterally. The first injection was done one half way between the occipital protuberance and the tip of the mastoid process behind the ear. The second injection site was done lateral and superior to the first, 1 fingerbreadth from the first injection. The third injection site was 1 fingerbreadth superiorly and medially from the first injection site.  -Cervical paraspinal muscle injection, 2 sites, bilateral knee first injection site was 1 cm from the midline of the cervical spine, 3 cm inferior to the lower border of the occipital protuberance. The second injection site was 1.5 cm superiorly and laterally to the first injection site.  -Trapezius muscle injection was performed at 3 sites, bilaterally. The first injection site was in the upper trapezius muscle halfway between the inflection point of the neck, and the acromion. The second injection site was one half way between the acromion and the first injection site. The third injection was done between the first injection site and the inflection point of the neck.   Will return for repeat injection in 3 months.   A 200 unit sof Botox was used, any Botox not injected was wasted. The patient tolerated the procedure well, there were no complications of the above procedure.

## 2018-12-16 ENCOUNTER — Ambulatory Visit: Payer: Medicare HMO | Attending: Internal Medicine | Admitting: Internal Medicine

## 2018-12-16 ENCOUNTER — Encounter: Payer: Self-pay | Admitting: Internal Medicine

## 2018-12-16 ENCOUNTER — Other Ambulatory Visit: Payer: Self-pay

## 2018-12-16 DIAGNOSIS — Z794 Long term (current) use of insulin: Secondary | ICD-10-CM | POA: Diagnosis not present

## 2018-12-16 DIAGNOSIS — E11319 Type 2 diabetes mellitus with unspecified diabetic retinopathy without macular edema: Secondary | ICD-10-CM | POA: Diagnosis not present

## 2018-12-16 DIAGNOSIS — F22 Delusional disorders: Secondary | ICD-10-CM | POA: Diagnosis not present

## 2018-12-16 DIAGNOSIS — N183 Chronic kidney disease, stage 3 unspecified: Secondary | ICD-10-CM

## 2018-12-16 DIAGNOSIS — N281 Cyst of kidney, acquired: Secondary | ICD-10-CM | POA: Diagnosis not present

## 2018-12-16 DIAGNOSIS — R05 Cough: Secondary | ICD-10-CM

## 2018-12-16 DIAGNOSIS — I10 Essential (primary) hypertension: Secondary | ICD-10-CM

## 2018-12-16 DIAGNOSIS — R058 Other specified cough: Secondary | ICD-10-CM

## 2018-12-16 DIAGNOSIS — E041 Nontoxic single thyroid nodule: Secondary | ICD-10-CM | POA: Diagnosis not present

## 2018-12-16 DIAGNOSIS — I5032 Chronic diastolic (congestive) heart failure: Secondary | ICD-10-CM

## 2018-12-16 MED ORDER — MONTELUKAST SODIUM 10 MG PO TABS: ORAL_TABLET | ORAL | 1 refills | Status: DC

## 2018-12-16 NOTE — Progress Notes (Signed)
Pt states her blood sugar this morning was 139

## 2018-12-16 NOTE — Progress Notes (Signed)
Virtual Visit via Telephone Note Due to current restrictions/limitations of in-office visits due to the COVID-19 pandemic, this scheduled clinical appointment was converted to a telehealth visit  I connected with Heidi [name] on 12/16/18 at 1:49 p.m EDT by telephone and verified that I am speaking with the correct person using two identifiers. I am in my office.  The patient is at home.  Only the patient and myself participated in this encounter.  I discussed the limitations, risks, security and privacy concerns of performing an evaluation and management service by telephone and the availability of in person appointments. I also discussed with the patient that there may be a patient responsible charge related to this service. The patient expressed understanding and agreed to proceed.  History of Present Illness: 68 year old female with history of HTN, DM 2, HL, CKD 3, CHF, migraines([facility] on Botox inj),OSA (noton BiPAP), UACS,GERD, OA knees,DDD of cervical spine (followed by [facility])obesity.  Patient was last seen 08/2018  DM:  Last A1C was 9.4.   Checking BS 3-4 x a day before meals. Morning range: 128, 130, 140, 103 ,113, 103,92, 156, 103, 98 Lunch range 80-90 Dinner range:129, 140, 157, 130, 120, 124, 140, 155, 160, 179 Med: taking Novolog 70/30 80 a.m/50 p.m and Januvia Eating habits: "doing much better than what I was." Exercise: she walks her dog several times a wk. Last wgh 236 lbs which was about what she was when we last saw her.  HTN/diastolic CHF:  BP cuff not working well.  Compliant with HCTZ, Losartan and Atenolol No CP.  "My breathing has been just fine." Had some flare of migraines.  Followed by Dr. Jaynee Eagles.  Rec Botox inj this wk  Renal cyst/LT thyroid nodule: f/u US of thyroid and kidney ordered in 07/2018.  She put off having it done because the co-pay would be $100+.  She states that she is paying off several other bills first and when she is done  then she will have these rescheduled.  Has a form to request student loan forgiveness.  Last worked 2006. Received disability in 2007, she does not recall why she was placed on disability.  Outpatient Encounter Medications as of 12/16/2018  Medication Sig  . ACCU-CHEK FASTCLIX LANCETS MISC Use as directed to test blood sugar three times daily DX E11.65  . albuterol (PROVENTIL HFA;VENTOLIN HFA) 108 (90 Base) MCG/ACT inhaler INHALE 2 PUFFS INTO THE LUNGS EVERY 6 HOURS AS NEEDED FOR WHEEZING OR SHORTNESS OF BREATH  . albuterol (PROVENTIL) (2.5 MG/3ML) 0.083% nebulizer solution USE 1 VIAL VIA NEBULIZER EVERY 6 HOURS AS NEEDED FOR WHEEZING OR SHORTNESS OF BREATH  . Ascorbic Acid (VITAMIN C PO) Take 1 tablet by mouth daily.  Marland Kitchen aspirin 81 MG chewable tablet Chew 81 mg by mouth daily.   Marland Kitchen atenolol (TENORMIN) 25 MG tablet TAKE 1 TABLET(25 MG) BY MOUTH DAILY  . atorvastatin (LIPITOR) 40 MG tablet TAKE 1 TABLET BY MOUTH DAILY AT 6 PM  . Blood Glucose Monitoring Suppl (ACCU-CHEK GUIDE) w/Device KIT 1 each by Does not apply route 3 (three) times daily. Use as directed to test blood sugar three times daily DX E11.65  . CALCIUM 500/D 500-200 MG-UNIT tablet TAKE 1 TABLET BY MOUTH DAILY  . cetirizine (ZYRTEC) 10 MG tablet Take 1 tablet (10 mg total) by mouth daily.  . famotidine (PEPCID) 20 MG tablet Take 1 tablet (20 mg total) by mouth at bedtime.  Marland Kitchen glucose blood (ACCU-CHEK GUIDE) test strip Use as directed to test  blood sugar three times daily DX E11.65  . guaiFENesin (MUCINEX PO) Take by mouth.  . hydrochlorothiazide (HYDRODIURIL) 25 MG tablet Take 1 tablet (25 mg total) by mouth daily.  . insulin aspart protamine- aspart (NOVOLOG MIX 70/30) (70-30) 100 UNIT/ML injection 80 units subcutaneous in the a.m. and 50 units in the evenings with meals  . INSULIN SYRINGE 1CC/29G 29G X 1/2" 1 ML MISC USE THREE TIMES DAILY AS DIRECTED  . losartan (COZAAR) 50 MG tablet TAKE 1 TABLET(50 MG) BY MOUTH DAILY  .  mometasone-formoterol (DULERA) 100-5 MCG/ACT AERO Inhale 2 puffs into the lungs 2 (two) times daily.  . montelukast (SINGULAIR) 10 MG tablet TAKE 1 TABLET(10 MG) BY MOUTH DAILY  . Multiple Vitamin (MULTIVITAMIN WITH MINERALS) TABS tablet Take 1 tablet by mouth daily.  . nitroGLYCERIN (NITROSTAT) 0.3 MG SL tablet Place 1 tablet (0.3 mg total) under the tongue every 5 (five) minutes as needed for chest pain. Max 3 tablets in 15 minutes  . pantoprazole (PROTONIX) 40 MG tablet Take 1 tablet (40 mg total) by mouth daily.  . Potassium Chloride ER 20 MEQ TBCR Take 20 mEq by mouth daily.  . rizatriptan (MAXALT) 10 MG tablet Take 1 tablet (10 mg total) by mouth as needed for migraine. May repeat in 2 hours if needed. MAX 2 TABLETS PER 24 HOURS.  . sitaGLIPtin (JANUVIA) 50 MG tablet Take 1 tablet (50 mg total) by mouth daily.  Marland Kitchen topiramate (TOPAMAX) 25 MG capsule Take 3 capsules (75 mg total) by mouth 2 (two) times daily.   No facility-administered encounter medications on file as of 12/16/2018.     Observations/Objective: No direct observation done as this was a telephone encounter Depression screen [facility] 2/9 08/22/2018 08/16/2018 05/03/2018  Decreased Interest 0 0 0  Down, Depressed, Hopeless 0 0 0  PHQ - 2 Score 0 0 0  Altered sleeping - - -  Tired, decreased energy - - -  Change in appetite - - -  Feeling bad or failure about yourself  - - -  Trouble concentrating - - -  Moving slowly or fidgety/restless - - -  Suicidal thoughts - - -  PHQ-9 Score - - -  Some recent data might be hidden   Assessment and Plan: 1. 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  Encourage her to continue healthy eating habits.  No changes made to dose of insulin and Januvia.  She will come to the lab at her convenience to have an A1c done. -  Hemoglobin A1c; Future - Basic Metabolic Panel; Future  2. Essential hypertension Continue current medications and DASH diet.  Patient plans to purchase a new home blood pressure monitoring device.  If she does, I advised to check the blood pressure at least twice a week with goal being 130/80 or lower  3. Chronic diastolic congestive heart failure (HCC) Clinically she sounds compensated  4. Upper airway cough syndrome Patient requesting refill on Singulair - montelukast (SINGULAIR) 10 MG tablet; TAKE 1 TABLET(10 MG) BY MOUTH DAILY  Dispense: 90 tablet; Refill: 1  5. CKD (chronic kidney disease) stage 3, GFR 30-59 ml/min (HCC) This had improved on last chemistry - Basic Metabolic Panel; Future  6. Left thyroid nodule Patient plans to reschedule thyroid ultrasound when she has the co-pay  7. Renal cyst As to reschedule kidney ultrasound when she has co-pay  8.  Paranoia -Not a major issue recently  Patient told that she should complete her portion of the form that she wants me to complete and then she can drop it off at my office.  Follow Up Instructions: F/u in 4 mths   I discussed the assessment and treatment plan with the patient. The patient was provided an opportunity to ask questions and all were answered. The patient agreed with the plan and demonstrated an understanding of the instructions.   The patient was advised to call back or seek an in-person evaluation if the symptoms worsen or if the condition fails to improve as anticipated.  I provided 24 minutes of non-face-to-face time during this encounter.   Karle Plumber, MD

## 2018-12-22 ENCOUNTER — Emergency Department (HOSPITAL_COMMUNITY)
Admission: EM | Admit: 2018-12-22 | Discharge: 2018-12-22 | Disposition: A | Discharge status: Home / Self Care | Payer: Medicare HMO | Attending: Emergency Medicine | Admitting: Emergency Medicine

## 2018-12-22 ENCOUNTER — Other Ambulatory Visit: Payer: Self-pay

## 2018-12-22 DIAGNOSIS — T161XXA Foreign body in right ear, initial encounter: Secondary | ICD-10-CM | POA: Diagnosis not present

## 2018-12-22 DIAGNOSIS — Y9389 Activity, other specified: Secondary | ICD-10-CM | POA: Diagnosis not present

## 2018-12-22 DIAGNOSIS — I509 Heart failure, unspecified: Secondary | ICD-10-CM | POA: Insufficient documentation

## 2018-12-22 DIAGNOSIS — H608X3 Other otitis externa, bilateral: Secondary | ICD-10-CM

## 2018-12-22 DIAGNOSIS — N182 Chronic kidney disease, stage 2 (mild): Secondary | ICD-10-CM | POA: Insufficient documentation

## 2018-12-22 DIAGNOSIS — E119 Type 2 diabetes mellitus without complications: Secondary | ICD-10-CM | POA: Diagnosis not present

## 2018-12-22 DIAGNOSIS — H6061 Unspecified chronic otitis externa, right ear: Secondary | ICD-10-CM | POA: Diagnosis not present

## 2018-12-22 DIAGNOSIS — H6691 Otitis media, unspecified, right ear: Secondary | ICD-10-CM | POA: Insufficient documentation

## 2018-12-22 DIAGNOSIS — Z794 Long term (current) use of insulin: Secondary | ICD-10-CM | POA: Insufficient documentation

## 2018-12-22 DIAGNOSIS — Y999 Unspecified external cause status: Secondary | ICD-10-CM | POA: Insufficient documentation

## 2018-12-22 DIAGNOSIS — X58XXXA Exposure to other specified factors, initial encounter: Secondary | ICD-10-CM | POA: Diagnosis not present

## 2018-12-22 DIAGNOSIS — E11319 Type 2 diabetes mellitus with unspecified diabetic retinopathy without macular edema: Secondary | ICD-10-CM | POA: Diagnosis not present

## 2018-12-22 DIAGNOSIS — I130 Hypertensive heart and chronic kidney disease with heart failure and stage 1 through stage 4 chronic kidney disease, or unspecified chronic kidney disease: Secondary | ICD-10-CM | POA: Diagnosis not present

## 2018-12-22 DIAGNOSIS — Y929 Unspecified place or not applicable: Secondary | ICD-10-CM | POA: Diagnosis not present

## 2018-12-22 DIAGNOSIS — H6063 Unspecified chronic otitis externa, bilateral: Secondary | ICD-10-CM | POA: Diagnosis not present

## 2018-12-22 DIAGNOSIS — J45909 Unspecified asthma, uncomplicated: Secondary | ICD-10-CM | POA: Diagnosis not present

## 2018-12-22 DIAGNOSIS — H9201 Otalgia, right ear: Secondary | ICD-10-CM | POA: Diagnosis present

## 2018-12-22 MED ORDER — AMOXICILLIN-POT CLAVULANATE 875-125 MG PO TABS: 1.0000 | ORAL_TABLET | Freq: Two times a day (BID) | ORAL | 0 refills | Status: AC

## 2018-12-22 MED ORDER — ACETIC ACID 2 % OT SOLN: 4.0000 [drp] | Freq: Four times a day (QID) | OTIC | 0 refills | Status: DC | PRN

## 2018-12-22 NOTE — ED Notes (Signed)
Ambulatory to bathroom.  Pt given warm blankets.

## 2018-12-22 NOTE — ED Triage Notes (Signed)
Per pt she has a q-tips in her right ear. Pianful

## 2018-12-22 NOTE — ED Provider Notes (Signed)
[facility] EMERGENCY DEPARTMENT Provider Note   CSN: [id number] Arrival date & time: 12/22/18  0253    History   Chief Complaint Chief Complaint  Patient presents with  . Otalgia    HPI Heidi [name] is a 68 y.o. female with a history of diabetes mellitus type 2, asthma, and HTN who presents to the emergency department with a chief complaint of foreign body in right ear.  The patient reports she was having some pain and irritation in her right ear earlier tonight.  She inserted a Q-tip into the canal of the right ear, but the entire cotton swab portion fell off and is stuck in her right ear canal.  She reports that she has been having some pain and irritation in the right ear for the last few days.  No recent changes in her hearing, fever, chills, neck pain or stiffness.  No treatment prior to arrival.     The history is provided by the patient. No language interpreter was used.    Past Medical History:  Diagnosis Date  . Adenomatous colon polyp   . Anemia   . Anxiety   . Asthma   . CHF (congestive heart failure) ([place])   . Diabetes mellitus without complication ([place])   . High cholesterol   . Hypertension   . Left knee injury   . Pneumonia     Patient Active Problem List   Diagnosis Date Noted  . Cervical myelopathy ([place]) 08/16/2018  . Diabetic retinopathy of right eye associated with type 2 diabetes mellitus ([place]) 08/16/2018  . Upper airway cough syndrome 05/03/2018  . Morbid obesity due to excess calories ([place]) complicated by hbp/ dm / hyperlipidemia 09/10/2017  . Cough variant asthma vs UACS/vcd on ACEi  09/09/2017  . Anxiety and depression 09/06/2017  . Mild persistent asthma without complication 94/70/9628  . Paranoia ([place]) 09/06/2017  . Torn left ear lobe 11/20/2016  . Chronic migraine w/o aura w/o status migrainosus, not intractable 06/27/2016  . Post-menopausal atrophic vaginitis 11/14/2015  . Primary osteoarthritis of both knees  07/18/2015  . GERD (gastroesophageal reflux disease) 05/08/2015  . Renal cyst 05/08/2015  . Adenomatous polyp of colon 03/26/2015  . Essential hypertension 02/12/2015  . Cognitive deficit due to old head trauma 02/12/2015  . Asthma, chronic 02/12/2015  . Chronic left shoulder pain 02/11/2015  . Thyroid nodule 12/18/2014  . Chronic neck pain   . HLD (hyperlipidemia)   . OSA treated with BiPAP 11/27/2014  . Obesity hypoventilation syndrome ([place]) 11/27/2014  . Demand ischemia ([place])   . Diabetes type 2, uncontrolled ([place])   . Cardiomegaly 11/25/2014  . CHF (congestive heart failure) ([place]) 11/25/2014  . DM type 2 causing CKD stage 2 ([place]) 11/25/2014    Past Surgical History:  Procedure Laterality Date  . ABDOMINAL HYSTERECTOMY    . CATARACT EXTRACTION Left   . CATARACT EXTRACTION     LT 01/2017, 02/2017 on RT  . CESAREAN SECTION       OB History   No obstetric history on file.      Home Medications    Prior to Admission medications   Medication Sig Start Date End Date Taking? Authorizing Provider  ACCU-CHEK FASTCLIX LANCETS MISC Use as directed to test blood sugar three times daily DX E11.65 06/27/18   Ladell Pier, MD  acetic acid 2 % otic solution Place 4 drops into both ears every 6 (six) hours as needed. 12/22/18   Yliana Gravois A, PA-C  albuterol (PROVENTIL HFA;VENTOLIN HFA) 108 (90 Base) MCG/ACT inhaler INHALE 2 PUFFS INTO THE LUNGS EVERY 6 HOURS AS NEEDED FOR WHEEZING OR SHORTNESS OF BREATH 09/06/17   Ladell Pier, MD  albuterol (PROVENTIL) (2.5 MG/3ML) 0.083% nebulizer solution USE 1 VIAL VIA NEBULIZER EVERY 6 HOURS AS NEEDED FOR WHEEZING OR SHORTNESS OF BREATH 02/21/18   Ladell Pier, MD  amoxicillin-clavulanate (AUGMENTIN) 875-125 MG tablet Take 1 tablet by mouth every 12 (twelve) hours for 7 days. 12/22/18 12/29/18  Yanni Ruberg A, PA-C  Ascorbic Acid (VITAMIN C PO) Take 1 tablet by mouth daily.    [provider]  aspirin 81 MG chewable tablet  Chew 81 mg by mouth daily.     [provider]  atenolol (TENORMIN) 25 MG tablet TAKE 1 TABLET(25 MG) BY MOUTH DAILY 12/03/18   Ladell Pier, MD  atorvastatin (LIPITOR) 40 MG tablet TAKE 1 TABLET BY MOUTH DAILY AT 6 PM 09/26/18   Ladell Pier, MD  Blood Glucose Monitoring Suppl (ACCU-CHEK GUIDE) w/Device KIT 1 each by Does not apply route 3 (three) times daily. Use as directed to test blood sugar three times daily DX E11.65 06/27/18   Ladell Pier, MD  CALCIUM 500/D 500-200 MG-UNIT tablet TAKE 1 TABLET BY MOUTH DAILY 06/20/18   Ladell Pier, MD  cetirizine (ZYRTEC) 10 MG tablet Take 1 tablet (10 mg total) by mouth daily. 12/21/17   Zigmund Gottron, NP  famotidine (PEPCID) 20 MG tablet Take 1 tablet (20 mg total) by mouth at bedtime. 10/05/18   Ladell Pier, MD  glucose blood (ACCU-CHEK GUIDE) test strip Use as directed to test blood sugar three times daily DX E11.65 06/27/18   Ladell Pier, MD  guaiFENesin (MUCINEX PO) Take by mouth.    [provider]  hydrochlorothiazide (HYDRODIURIL) 25 MG tablet Take 1 tablet (25 mg total) by mouth daily. 05/03/18   Ladell Pier, MD  insulin aspart protamine- aspart (NOVOLOG MIX 70/30) (70-30) 100 UNIT/ML injection 80 units subcutaneous in the a.m. and 50 units in the evenings with meals 07/19/18   Ladell Pier, MD  INSULIN SYRINGE 1CC/29G 29G X 1/2" 1 ML MISC USE THREE TIMES DAILY AS DIRECTED 09/26/18   Ladell Pier, MD  losartan (COZAAR) 50 MG tablet TAKE 1 TABLET(50 MG) BY MOUTH DAILY 09/26/18   Ladell Pier, MD  mometasone-formoterol (DULERA) 100-5 MCG/ACT AERO Inhale 2 puffs into the lungs 2 (two) times daily. 10/13/18   Ladell Pier, MD  montelukast (SINGULAIR) 10 MG tablet TAKE 1 TABLET(10 MG) BY MOUTH DAILY 12/16/18   Ladell Pier, MD  Multiple Vitamin (MULTIVITAMIN WITH MINERALS) TABS tablet Take 1 tablet by mouth daily.    [provider]  nitroGLYCERIN (NITROSTAT)  0.3 MG SL tablet Place 1 tablet (0.3 mg total) under the tongue every 5 (five) minutes as needed for chest pain. Max 3 tablets in 15 minutes 08/07/16   Funches, Adriana Mccallum, MD  pantoprazole (PROTONIX) 40 MG tablet Take 1 tablet (40 mg total) by mouth daily. 09/26/18   Ladell Pier, MD  Potassium Chloride ER 20 MEQ TBCR Take 20 mEq by mouth daily. 05/03/18   Ladell Pier, MD  rizatriptan (MAXALT) 10 MG tablet Take 1 tablet (10 mg total) by mouth as needed for migraine. May repeat in 2 hours if needed. MAX 2 TABLETS PER 24 HOURS. 10/20/18   Melvenia Beam, MD  sitaGLIPtin (JANUVIA) 50 MG tablet Take 1 tablet (50  mg total) by mouth daily. 08/17/18   Ladell Pier, MD  topiramate (TOPAMAX) 25 MG capsule Take 3 capsules (75 mg total) by mouth 2 (two) times daily. 11/03/18   Melvenia Beam, MD    Family History Family History  Problem Relation Age of Onset  . Colon cancer Father   . Diabetes type II Sister   . Diabetes type II Brother   . Colon cancer Sister   . Migraines Neg Hx   . Breast cancer Neg Hx     Social History Social History   Tobacco Use  . Smoking status: Never Smoker  . Smokeless tobacco: Never Used  Substance Use Topics  . Alcohol use: No  . Drug use: No     Allergies   Patient has no known allergies.   Review of Systems Review of Systems  Constitutional: Negative for activity change, chills and fever.  HENT: Positive for ear pain. Negative for rhinorrhea and sore throat.   Respiratory: Negative for shortness of breath.   Cardiovascular: Negative for chest pain.  Gastrointestinal: Negative for abdominal pain.  Musculoskeletal: Negative for back pain.  Skin: Negative for rash.     Physical Exam Updated Vital Signs BP (!) 139/58 (BP Location: Right Arm)   Pulse 78   Temp 98.4 F (36.9 C)   Resp 18   SpO2 95%   Physical Exam Vitals signs and nursing note reviewed.  Constitutional:      General: She is not in acute distress. HENT:      Head: Normocephalic.     Comments: Cotton is occluding the right canal.  Left canal is eczematous.  Left TM is unremarkable.  No mastoid tenderness bilaterally. Eyes:     Conjunctiva/sclera: Conjunctivae normal.  Neck:     Musculoskeletal: Neck supple.  Cardiovascular:     Rate and Rhythm: Normal rate and regular rhythm.     Heart sounds: No murmur. No friction rub. No gallop.   Pulmonary:     Effort: Pulmonary effort is normal. No respiratory distress.  Abdominal:     General: There is no distension.     Palpations: Abdomen is soft.  Skin:    General: Skin is warm.     Findings: No rash.  Neurological:     Mental Status: She is alert.  Psychiatric:        Behavior: Behavior normal.      ED Treatments / Results  Labs (all labs ordered are listed, but only abnormal results are displayed) Labs Reviewed - No data to display  EKG None  Radiology No results found.  Procedures .Foreign Body Removal  Date/Time: 12/22/2018 5:34 AM Performed by: Joanne Gavel, PA-C Authorized by: Joanne Gavel, PA-C  Consent: Verbal consent obtained. Consent given by: patient Patient understanding: patient states understanding of the procedure being performed Patient consent: the patient's understanding of the procedure matches consent given Procedure consent: procedure consent matches procedure scheduled Relevant documents: relevant documents present and verified Patient identity confirmed: verbally with patient Body area: ear Location details: right ear Localization method: ENT speculum Removal mechanism: forceps Complexity: simple 1 objects recovered. Objects recovered: cotton swab Post-procedure assessment: foreign body removed Patient tolerance: patient tolerated the procedure well with no immediate complications   (including critical care time)  Medications Ordered in ED Medications - No data to display   Initial Impression / Assessment and Plan / ED Course  I have  reviewed the triage vital signs and the nursing notes.  Pertinent  labs & imaging results that were available during my care of the patient were reviewed by me and considered in my medical decision making (see chart for details).        68 year old female with a history of diabetes mellitus type 2, asthma, and HTN who is here with a foreign body in her right ear after part of a cotton swab became lodged in the right ear canal.  Cotton swab successfully removed by me using forceps.  On reevaluation of the right ear, there is a  purulent effusion.  No mastoid tenderness.  Doubt mastoiditis, cellulitis, or malignant OE. We will discharge the patient with Augmentin for acute otitis media.  She is also been endorsing itching in the bilateral canals and I will discharge her with acetic acid for eczematous otitis externa.  She is hemodynamically stable in no acute distress.  Safe for discharge home with outpatient follow-up.  Final Clinical Impressions(s) / ED Diagnoses   Final diagnoses:  Foreign body of right ear, initial encounter  Right acute otitis media  Chronic eczematous otitis externa of both ears    ED Discharge Orders         Ordered    amoxicillin-clavulanate (AUGMENTIN) 875-125 MG tablet  Every 12 hours     12/22/18 0529    acetic acid 2 % otic solution  Every 6 hours PRN     12/22/18 0529           Geanette Buonocore A, PA-C 12/22/18 0540    Ripley Fraise, MD 12/22/18 [phone]

## 2018-12-22 NOTE — Discharge Instructions (Addendum)
Thank you for allowing me to care for you today in the Emergency Department.   Avoid putting small objects inside the ear that can become lodged or potentially damage the eardrum.  This includes Q-tips.  For your right ear infection, take 1 tablet of Augmentin 2 times daily for the next 7 days.  It is important you finish the entire course of antibiotics to avoid antibiotic resistance.  For the irritation that you have been having in your ear canals, you can insert 4 drops of acetic acid otic solution into either the left, right, or both ears every 6 hours as needed.   Follow-up with your primary care provider in 1 to 2 weeks if you continue to have irritation in your ears.  Return to the emergency department if you develop new or worsening symptoms including significant pain behind the ear, pain that radiates down to your jaw, bloody discharge from the ear, if you become unable to move your neck because of the stiff has severe pain, or other new, concerning symptoms.

## 2018-12-29 ENCOUNTER — Other Ambulatory Visit: Payer: Self-pay | Admitting: Internal Medicine

## 2018-12-29 DIAGNOSIS — I10 Essential (primary) hypertension: Secondary | ICD-10-CM

## 2019-02-01 ENCOUNTER — Other Ambulatory Visit: Payer: Self-pay | Admitting: Internal Medicine

## 2019-02-07 ENCOUNTER — Ambulatory Visit (INDEPENDENT_AMBULATORY_CARE_PROVIDER_SITE_OTHER): Payer: Medicare HMO

## 2019-02-07 ENCOUNTER — Ambulatory Visit (HOSPITAL_COMMUNITY)
Admission: EM | Admit: 2019-02-07 | Discharge: 2019-02-07 | Disposition: A | Discharge status: Home / Self Care | Payer: Medicare HMO | Attending: Emergency Medicine | Admitting: Emergency Medicine

## 2019-02-07 ENCOUNTER — Encounter (HOSPITAL_COMMUNITY): Payer: Self-pay | Admitting: Emergency Medicine

## 2019-02-07 DIAGNOSIS — T5991XA Toxic effect of unspecified gases, fumes and vapors, accidental (unintentional), initial encounter: Secondary | ICD-10-CM

## 2019-02-07 DIAGNOSIS — R42 Dizziness and giddiness: Secondary | ICD-10-CM | POA: Diagnosis not present

## 2019-02-07 DIAGNOSIS — R0602 Shortness of breath: Secondary | ICD-10-CM | POA: Diagnosis not present

## 2019-02-07 DIAGNOSIS — R112 Nausea with vomiting, unspecified: Secondary | ICD-10-CM

## 2019-02-07 LAB — BASIC METABOLIC PANEL
Anion gap: 10 (ref 5–15)
BUN: 15 mg/dL (ref 8–23)
CO2: 28 mmol/L (ref 22–32)
Calcium: 9.5 mg/dL (ref 8.9–10.3)
Chloride: 104 mmol/L (ref 98–111)
Creatinine, Ser: 1.03 mg/dL — ABNORMAL HIGH (ref 0.44–1.00)
GFR calc Af Amer: >60 mL/min (ref >60)
GFR calc non Af Amer: 57 mL/min — ABNORMAL LOW (ref >60)
Glucose, Bld: 87 mg/dL (ref 70–99)
Potassium: 3.5 mmol/L (ref 3.5–5.1)
Sodium: 142 mmol/L (ref 135–145)

## 2019-02-07 LAB — CBC
HCT: 44.1 % (ref 36.0–46.0)
Hemoglobin: 13.8 g/dL (ref 12.0–15.0)
MCH: 27.7 pg (ref 26.0–34.0)
MCHC: 31.3 g/dL (ref 30.0–36.0)
MCV: 88.6 fL (ref 80.0–100.0)
Platelets: 242 10*3/uL (ref 150–400)
RBC: 4.98 MIL/uL (ref 3.87–5.11)
RDW: 15.6 % — ABNORMAL HIGH (ref 11.5–15.5)
WBC: 7.9 10*3/uL (ref 4.0–10.5)
nRBC: 0 % (ref 0.0–0.2)

## 2019-02-07 LAB — GLUCOSE, CAPILLARY: Glucose-Capillary: 89 mg/dL (ref 70–99)

## 2019-02-07 MED ORDER — ALBUTEROL SULFATE HFA 108 (90 BASE) MCG/ACT IN AERS: 1.0000 | INHALATION_SPRAY | Freq: Four times a day (QID) | RESPIRATORY_TRACT | 0 refills | Status: DC | PRN

## 2019-02-07 MED ORDER — ONDANSETRON 4 MG PO TBDP: 4.0000 mg | ORAL_TABLET | Freq: Three times a day (TID) | ORAL | 0 refills | Status: DC | PRN

## 2019-02-07 NOTE — Discharge Instructions (Signed)
Your symptoms should slowly resolve over time Please be sure to open your windows at home to allow to fully air out as well as an apartment below Please use albuterol inhaler as needed for shortness of breath Zofran as needed for nausea and vomiting  I will call with results of blood work if abnormal.
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
## 2019-02-14 ENCOUNTER — Other Ambulatory Visit: Payer: Self-pay | Admitting: Internal Medicine

## 2019-03-12 ENCOUNTER — Other Ambulatory Visit: Payer: Self-pay | Admitting: Internal Medicine

## 2019-03-12 DIAGNOSIS — E1165 Type 2 diabetes mellitus with hyperglycemia: Secondary | ICD-10-CM

## 2019-03-12 DIAGNOSIS — IMO0002 Reserved for concepts with insufficient information to code with codable children: Secondary | ICD-10-CM

## 2019-03-14 ENCOUNTER — Other Ambulatory Visit: Payer: Self-pay

## 2019-03-14 ENCOUNTER — Ambulatory Visit (INDEPENDENT_AMBULATORY_CARE_PROVIDER_SITE_OTHER): Payer: Medicare HMO | Admitting: Neurology

## 2019-03-14 VITALS — BP 138/80 | HR 84 | Temp 99.3°F

## 2019-03-14 DIAGNOSIS — G43709 Chronic migraine without aura, not intractable, without status migrainosus: Secondary | ICD-10-CM

## 2019-03-14 NOTE — Progress Notes (Signed)
Botox- 200 units x 1 vial Lot: [id number] Expiration: 11/2021 NDC: [id number]  Bacteriostatic 0.9% Sodium Chloride- 61mL total Lot: [id number] Expiration: 04/06/2019 NDC: [id number]  Dx: [id number] B/B

## 2019-03-14 NOTE — Progress Notes (Signed)
Consent Form Botulism Toxin Injection For Chronic Migraine   Interval history 03/14/2019: Patient with chronic intractable migraines. Doing exceptionally well on Botox for Migraines. Prior tried multiple classes of medications which did not help with headaches or migraines until she started Botox. Since starting botox she has had  >80% improvement in frequency and severity. The migraines she does have are sparse are easier to treat and >60% less severe.Nothing has worked in the past except botox and Topiramate.  NO temples, no masseters, no LS, no eyes   Reviewed orally with patient, additionally signature is on file:  Botulism toxin has been approved by the Federal drug administration for treatment of chronic migraine. Botulism toxin does not cure chronic migraine and it may not be effective in some patients.  The administration of botulism toxin is accomplished by injecting a small amount of toxin into the muscles of the neck and head. Dosage must be titrated for each individual. Any benefits resulting from botulism toxin tend to wear off after 3 months with a repeat injection required if benefit is to be maintained. Injections are usually done every 3-4 months with maximum effect peak achieved by about 2 or 3 weeks. Botulism toxin is expensive and you should be sure of what costs you will incur resulting from the injection.  The side effects of botulism toxin use for chronic migraine may include:   -Transient, and usually mild, facial weakness with facial injections  -Transient, and usually mild, head or neck weakness with head/neck injections  -Reduction or loss of forehead facial animation due to forehead muscle weakness  -Eyelid drooping  -Dry eye  -Pain at the site of injection or bruising at the site of injection  -Double vision  -Potential unknown long term risks  Contraindications: You should not have Botox if you are pregnant, nursing, allergic to albumin, have an infection,  skin condition, or muscle weakness at the site of the injection, or have myasthenia gravis, Lambert-Eaton syndrome, or ALS.  It is also possible that as with any injection, there may be an allergic reaction or no effect from the medication. Reduced effectiveness after repeated injections is sometimes seen and rarely infection at the injection site may occur. All care will be taken to prevent these side effects. If therapy is given over a long time, atrophy and wasting in the muscle injected may occur. Occasionally the patient's become refractory to treatment because they develop antibodies to the toxin. In this event, therapy needs to be modified.  I have read the above information and consent to the administration of botulism toxin.    BOTOX PROCEDURE NOTE FOR MIGRAINE HEADACHE    Contraindications and precautions discussed with patient(above). Aseptic procedure was observed and patient tolerated procedure. Procedure performed by Dr. Georgia Dom  The condition has existed for more than 6 months, and pt does not have a diagnosis of ALS, Myasthenia Gravis or Lambert-Eaton Syndrome.  Risks and benefits of injections discussed and pt agrees to proceed with the procedure.  Written consent obtained  These injections are medically necessary. Pt  receives good benefits from these injections. These injections do not cause sedations or hallucinations which the oral therapies may cause.  Description of procedure:  The patient was placed in a sitting position. The standard protocol was used for Botox as follows, with 5 units of Botox injected at each site:   -Procerus muscle, midline injection  -Corrugator muscle, bilateral injection  -Frontalis muscle, bilateral injection, with 2 sites each side, medial  injection was performed in the upper one third of the frontalis muscle, in the region vertical from the medial inferior edge of the superior orbital rim. The lateral injection was again in the upper  one third of the forehead vertically above the lateral limbus of the cornea, 1.5 cm lateral to the medial injection site.  - Levator Scapulae: 5 units bilaterally  -Temporalis muscle injection, 5 sites, bilaterally. The first injection was 3 cm above the tragus of the ear, second injection site was 1.5 cm to 3 cm up from the first injection site in line with the tragus of the ear. The third injection site was 1.5-3 cm forward between the first 2 injection sites. The fourth injection site was 1.5 cm posterior to the second injection site.  -Occipitalis muscle injection, 3 sites, bilaterally. The first injection was done one half way between the occipital protuberance and the tip of the mastoid process behind the ear. The second injection site was done lateral and superior to the first, 1 fingerbreadth from the first injection. The third injection site was 1 fingerbreadth superiorly and medially from the first injection site.  -Cervical paraspinal muscle injection, 2 sites, bilateral knee first injection site was 1 cm from the midline of the cervical spine, 3 cm inferior to the lower border of the occipital protuberance. The second injection site was 1.5 cm superiorly and laterally to the first injection site.  -Trapezius muscle injection was performed at 3 sites, bilaterally. The first injection site was in the upper trapezius muscle halfway between the inflection point of the neck, and the acromion. The second injection site was one half way between the acromion and the first injection site. The third injection was done between the first injection site and the inflection point of the neck.   Will return for repeat injection in 3 months.   A 200 unit sof Botox was used, any Botox not injected was wasted. The patient tolerated the procedure well, there were no complications of the above procedure.

## 2019-03-27 ENCOUNTER — Ambulatory Visit: Payer: Medicare HMO | Admitting: Internal Medicine

## 2019-04-02 ENCOUNTER — Other Ambulatory Visit: Payer: Self-pay | Admitting: Internal Medicine

## 2019-04-03 ENCOUNTER — Other Ambulatory Visit: Payer: Self-pay | Admitting: Internal Medicine

## 2019-04-06 ENCOUNTER — Other Ambulatory Visit: Payer: Self-pay | Admitting: Internal Medicine

## 2019-04-25 ENCOUNTER — Ambulatory Visit (HOSPITAL_COMMUNITY)
Admission: EM | Admit: 2019-04-25 | Discharge: 2019-04-25 | Disposition: A | Discharge status: Home / Self Care | Payer: Medicare HMO | Attending: Family Medicine | Admitting: Family Medicine

## 2019-04-25 ENCOUNTER — Other Ambulatory Visit: Payer: Self-pay

## 2019-04-25 ENCOUNTER — Encounter (HOSPITAL_COMMUNITY): Payer: Self-pay

## 2019-04-25 DIAGNOSIS — I10 Essential (primary) hypertension: Secondary | ICD-10-CM | POA: Insufficient documentation

## 2019-04-25 DIAGNOSIS — N3090 Cystitis, unspecified without hematuria: Secondary | ICD-10-CM | POA: Diagnosis not present

## 2019-04-25 LAB — POCT URINALYSIS DIP (DEVICE)
Bilirubin Urine: NEGATIVE
Glucose, UA: NEGATIVE mg/dL
Ketones, ur: NEGATIVE mg/dL
Nitrite: NEGATIVE
Protein, ur: 100 mg/dL — AB
Specific Gravity, Urine: 1.025 (ref 1.005–1.030)
Urobilinogen, UA: 0.2 mg/dL (ref 0.0–1.0)
pH: 7.5 (ref 5.0–8.0)

## 2019-04-25 MED ORDER — PHENAZOPYRIDINE HCL 200 MG PO TABS: 200.0000 mg | ORAL_TABLET | Freq: Three times a day (TID) | ORAL | 0 refills | Status: DC

## 2019-04-25 MED ORDER — CEPHALEXIN 500 MG PO CAPS: 500.0000 mg | ORAL_CAPSULE | Freq: Two times a day (BID) | ORAL | 0 refills | Status: DC

## 2019-04-25 NOTE — ED Provider Notes (Signed)
[place]    ASSESSMENT & PLAN:  1. Cystitis   2. Essential hypertension      Meds ordered this encounter  Medications  . cephALEXin (KEFLEX) 500 MG capsule    Sig: Take 1 capsule (500 mg total) by mouth 2 (two) times daily.    Dispense:  10 capsule    Refill:  0  . phenazopyridine (PYRIDIUM) 200 MG tablet    Sig: Take 1 tablet (200 mg total) by mouth 3 (three) times daily.    Dispense:  6 tablet    Refill:  0    No signs of pyelonephritis. Discussed. Urine culture sent. Will notify patient when results available.  Follow-up Information    Ladell Pier, MD.   Specialty: Internal Medicine Why: As needed. Contact information: [place] [address] [phone]         Also to recheck BP. Does not need refills.  Outlined signs and symptoms indicating need for more acute intervention. Patient verbalized understanding. After Visit Summary given.  SUBJECTIVE:  Heidi [name] is a 68 y.o. female who complains of urinary frequency, urgency and dysuria for the past day. Without associated flank pain, fever, chills, vaginal discharge or bleeding. Gross hematuria: not present. No specific aggravating or alleviating factors reported. No LE edema. Normal PO intake without n/v/d. Without specific abdominal pain. Ambulatory without difficulty. OTC treatment: none. H/O UTI: "a few".  LMP: No LMP recorded. Patient has had a hysterectomy.  Increased blood pressure noted today. Reports that she is on medications to treat. Reprots taking medications as directed.  She reports no chest pain on exertion, no dyspnea on exertion, no swelling of ankles, no orthostatic dizziness or lightheadedness, no orthopnea or paroxysmal nocturnal dyspnea, no palpitations and no intermittent claudication symptoms.  ROS: As in HPI. All other systems negative.   OBJECTIVE:  Vitals:   04/25/19 1448  BP: (!) 141/71  Pulse: 84  Resp: 18  Temp: 97.8 F (36.6  C)  TempSrc: Tympanic  SpO2: 92%   General appearance: alert; no distress HENT: oropharynx: moist Lungs: unlabored respirations Abdomen: soft, non-tender; bowel sounds normal; no masses or organomegaly; no guarding or rebound tenderness Back: no CVA tenderness Extremities: no edema; symmetrical with no gross deformities Skin: warm and dry Neurologic: normal gait Psychological: alert and cooperative; normal mood and affect  Labs Reviewed  POCT URINALYSIS DIP (DEVICE) - Abnormal; Notable for the following components:      Result Value   Hgb urine dipstick LARGE (*)    Protein, ur 100 (*)    Leukocytes,Ua LARGE (*)    All other components within normal limits  URINE CULTURE    No Known Allergies  Past Medical History:  Diagnosis Date  . Adenomatous colon polyp   . Anemia   . Anxiety   . Asthma   . CHF (congestive heart failure) ([place])   . Diabetes mellitus without complication ([place])   . High cholesterol   . Hypertension   . Left knee injury   . Pneumonia    Social History   Socioeconomic History  . Marital status: Legally Separated    Spouse name: Not on file  . Number of children: 4  . Years of education: Not on file  . Highest education level: Not on file  Occupational History  . Occupation: Disabled  Social Needs  . Financial resource strain: Not on file  . Food insecurity    Worry: Not on file    Inability:  Not on file  . Transportation needs    Medical: Not on file    Non-medical: Not on file  Tobacco Use  . Smoking status: Never Smoker  . Smokeless tobacco: Never Used  Substance and Sexual Activity  . Alcohol use: No  . Drug use: No  . Sexual activity: Not on file  Lifestyle  . Physical activity    Days per week: Not on file    Minutes per session: Not on file  . Stress: Not on file  Relationships  . Social Herbalist on phone: Not on file    Gets together: Not on file    Attends religious service: Not on file    Active member of  club or organization: Not on file    Attends meetings of clubs or organizations: Not on file    Relationship status: Not on file  . Intimate partner violence    Fear of current or ex partner: Not on file    Emotionally abused: Not on file    Physically abused: Not on file    Forced sexual activity: Not on file  Other Topics Concern  . Not on file  Social History Narrative   Lives with daughter   Caffeine use: 1 cup coffee per day   Family History  Problem Relation Age of Onset  . Colon cancer Father   . Diabetes type II Sister   . Diabetes type II Brother   . Colon cancer Sister   . Migraines Neg Hx   . Breast cancer Neg Hx        Vanessa Kick, MD 04/25/19 1535

## 2019-04-25 NOTE — ED Triage Notes (Signed)
Pt presents with urinary tract symptoms; pt states she is not able to hold her bladder and has burning while urinating since yesterday.

## 2019-04-27 LAB — URINE CULTURE: Culture: >=100000 — AB

## 2019-04-28 ENCOUNTER — Telehealth (HOSPITAL_COMMUNITY): Payer: Self-pay | Admitting: Emergency Medicine

## 2019-04-28 ENCOUNTER — Telehealth: Payer: Self-pay | Admitting: Neurology

## 2019-04-28 ENCOUNTER — Other Ambulatory Visit: Payer: Self-pay | Admitting: Internal Medicine

## 2019-04-28 DIAGNOSIS — I10 Essential (primary) hypertension: Secondary | ICD-10-CM

## 2019-04-28 NOTE — Telephone Encounter (Signed)
Urine culture was positive for PROTEUS MIRABILIS and was given  keflex at urgent care visit. Pt contacted and made aware, educated on completing antibiotic and to follow up if symptoms are persistent. Verbalized understanding.    

## 2019-04-28 NOTE — Telephone Encounter (Signed)
Pt called wanting to schedule her BOTOX appt. Please advise. °

## 2019-04-30 ENCOUNTER — Other Ambulatory Visit: Payer: Self-pay | Admitting: Internal Medicine

## 2019-04-30 DIAGNOSIS — I10 Essential (primary) hypertension: Secondary | ICD-10-CM

## 2019-05-01 NOTE — Telephone Encounter (Signed)
I called and scheduled the patient, she was offered earlier apt with NP but declined.

## 2019-05-02 ENCOUNTER — Other Ambulatory Visit: Payer: Self-pay | Admitting: Internal Medicine

## 2019-05-16 ENCOUNTER — Other Ambulatory Visit: Payer: Self-pay | Admitting: Internal Medicine

## 2019-05-16 DIAGNOSIS — E785 Hyperlipidemia, unspecified: Secondary | ICD-10-CM

## 2019-05-22 ENCOUNTER — Other Ambulatory Visit: Payer: Self-pay | Admitting: Internal Medicine

## 2019-05-22 ENCOUNTER — Telehealth: Payer: Self-pay | Admitting: Internal Medicine

## 2019-05-22 DIAGNOSIS — E785 Hyperlipidemia, unspecified: Secondary | ICD-10-CM

## 2019-05-22 MED ORDER — ATORVASTATIN CALCIUM 40 MG PO TABS: ORAL_TABLET | ORAL | 0 refills | Status: DC

## 2019-05-22 NOTE — Telephone Encounter (Signed)
1) Medication(s) Requested (by name):atorvastatin (LIPITOR) 40 MG tablet [id number]    2) [facility], [address] AT [facility] [address]   3) Special Requests:   Approved medications will be sent to the pharmacy, we will reach out if there is an issue.  Requests made after 3pm may not be addressed until the following business day!  If a patient is unsure of the name of the medication(s) please note and ask patient to call back when they are able to provide all info, do not send to responsible party until all information is available!

## 2019-05-22 NOTE — Telephone Encounter (Signed)
Refill sent to cover until upcoming appointment in  December.

## 2019-05-24 ENCOUNTER — Other Ambulatory Visit: Payer: Self-pay | Admitting: Internal Medicine

## 2019-05-28 ENCOUNTER — Other Ambulatory Visit: Payer: Self-pay | Admitting: Internal Medicine

## 2019-05-28 DIAGNOSIS — I5033 Acute on chronic diastolic (congestive) heart failure: Secondary | ICD-10-CM

## 2019-05-28 DIAGNOSIS — I10 Essential (primary) hypertension: Secondary | ICD-10-CM

## 2019-05-29 ENCOUNTER — Other Ambulatory Visit: Payer: Self-pay | Admitting: Internal Medicine

## 2019-05-29 DIAGNOSIS — I5033 Acute on chronic diastolic (congestive) heart failure: Secondary | ICD-10-CM

## 2019-05-29 DIAGNOSIS — I10 Essential (primary) hypertension: Secondary | ICD-10-CM

## 2019-06-07 ENCOUNTER — Other Ambulatory Visit: Payer: Self-pay | Admitting: Internal Medicine

## 2019-06-09 ENCOUNTER — Other Ambulatory Visit: Payer: Self-pay

## 2019-06-09 ENCOUNTER — Ambulatory Visit: Payer: Medicare HMO | Attending: Internal Medicine | Admitting: Internal Medicine

## 2019-06-09 ENCOUNTER — Encounter: Payer: Self-pay | Admitting: Internal Medicine

## 2019-06-09 DIAGNOSIS — E1169 Type 2 diabetes mellitus with other specified complication: Secondary | ICD-10-CM

## 2019-06-09 DIAGNOSIS — E11319 Type 2 diabetes mellitus with unspecified diabetic retinopathy without macular edema: Secondary | ICD-10-CM | POA: Diagnosis not present

## 2019-06-09 DIAGNOSIS — I5032 Chronic diastolic (congestive) heart failure: Secondary | ICD-10-CM

## 2019-06-09 DIAGNOSIS — I10 Essential (primary) hypertension: Secondary | ICD-10-CM

## 2019-06-09 DIAGNOSIS — Z794 Long term (current) use of insulin: Secondary | ICD-10-CM | POA: Diagnosis not present

## 2019-06-09 DIAGNOSIS — G8929 Other chronic pain: Secondary | ICD-10-CM

## 2019-06-09 DIAGNOSIS — E785 Hyperlipidemia, unspecified: Secondary | ICD-10-CM | POA: Diagnosis not present

## 2019-06-09 DIAGNOSIS — M542 Cervicalgia: Secondary | ICD-10-CM | POA: Diagnosis not present

## 2019-06-09 DIAGNOSIS — K219 Gastro-esophageal reflux disease without esophagitis: Secondary | ICD-10-CM

## 2019-06-09 MED ORDER — LOSARTAN POTASSIUM 50 MG PO TABS: 50.0000 mg | ORAL_TABLET | Freq: Every day | ORAL | 11 refills | Status: DC

## 2019-06-09 MED ORDER — ATORVASTATIN CALCIUM 40 MG PO TABS: ORAL_TABLET | ORAL | 11 refills | Status: DC

## 2019-06-09 MED ORDER — FAMOTIDINE 20 MG PO TABS: ORAL_TABLET | ORAL | 2 refills | Status: DC

## 2019-06-09 MED ORDER — ATENOLOL 25 MG PO TABS: 25.0000 mg | ORAL_TABLET | Freq: Every day | ORAL | 3 refills | Status: DC

## 2019-06-09 MED ORDER — ALBUTEROL SULFATE HFA 108 (90 BASE) MCG/ACT IN AERS: 1.0000 | INHALATION_SPRAY | Freq: Four times a day (QID) | RESPIRATORY_TRACT | 0 refills | Status: DC | PRN

## 2019-06-09 MED ORDER — TRAMADOL HCL 50 MG PO TABS: 50.0000 mg | ORAL_TABLET | Freq: Three times a day (TID) | ORAL | 0 refills | Status: DC | PRN

## 2019-06-09 MED ORDER — PANTOPRAZOLE SODIUM 40 MG PO TBEC: 40.0000 mg | DELAYED_RELEASE_TABLET | Freq: Every day | ORAL | 6 refills | Status: DC

## 2019-06-09 NOTE — Progress Notes (Signed)
Pt states her blood sugar this morning was 122   Pt states she is also having neck pain

## 2019-06-09 NOTE — Progress Notes (Signed)
Virtual Visit via Telephone Note Due to current restrictions/limitations of in-office visits due to the COVID-19 pandemic, this scheduled clinical appointment was converted to a telehealth visit  I connected with Heidi [name] on 06/09/19 at 2:24 p.m by telephone and verified that I am speaking with the correct person using two identifiers. I am in my office.  The patient is at home.  Only the patient and myself participated in this encounter.  I discussed the limitations, risks, security and privacy concerns of performing an evaluation and management service by telephone and the availability of in person appointments. I also discussed with the patient that there may be a patient responsible charge related to this service. The patient expressed understanding and agreed to proceed.   History of Present Illness: history of HTN, DM 2, HL, CKD 3, diastolic CHF, migraines([facility] on Botox inj),OSA (noton BiPAP), UACS,GERD, OA knees,DDD of cervical spine ([facility])obesity, renal cyst, LT thyroid nodule. Last eval in June 2020   DM:  Forgot to come and have A1C done after the last telemedicine visit in June of this year.  Checking BS 3-4 x a day.  Morning BS over past wk: 137, 140, 160, 98, 119, 102, 122.  Before dinner: 127, 99,84, 94, 140, 100, 123 Taking Novolog 70/30 insulin 80 units in the morning /50 units in the evening.  She stopped Januvia 2 wks ago because it was causing chest pain Doing better with eating habits Not getting in much exercise due to pain in her back and neck.  Not able to get to her pain specialist recently -over due for eye exam.  She plans to call and schedule  HTN: checks BP a few times a wk.  Recent readings: 122/60, 124/54, 138/68 Limits salt in foods Compliant with meds No CP after stopping Januvia.  No LE edema, no headaches or dizziness.  HL:  Compliant with and tolerating Lipitor.  Needs refill.  Having a lot of pain in the neck.   last MRI done in 2017 revealed multilevel degenerative spondylosis with some degree of bilateral foraminal stenosis.  She has not seen her pain specialist in a while due to some limited finances.  Requesting RF on Hydrocodone until she can get back in with her pain specialist.  GERD: Requests refill on pantoprazole and famotidine.  She takes the famotidine at night and the pantoprazole in the mornings.  Reports that her symptoms are under good control with this regimen.  HM:  Had flu shot at [facility] 04/2019  Outpatient Encounter Medications as of 06/09/2019  Medication Sig  . ACCU-CHEK FASTCLIX LANCETS MISC Use as directed to test blood sugar three times daily DX E11.65  . acetic acid 2 % otic solution Place 4 drops into both ears every 6 (six) hours as needed.  Marland Kitchen albuterol (PROVENTIL) (2.5 MG/3ML) 0.083% nebulizer solution USE 1 VIAL VIA NEBULIZER EVERY 6 HOURS AS NEEDED FOR WHEEZING OR SHORTNESS OF BREATH  . albuterol (VENTOLIN HFA) 108 (90 Base) MCG/ACT inhaler Inhale 1-2 puffs into the lungs every 6 (six) hours as needed for wheezing or shortness of breath.  . Ascorbic Acid (VITAMIN C PO) Take 1 tablet by mouth daily.  Marland Kitchen aspirin 81 MG chewable tablet Chew 81 mg by mouth daily.   Marland Kitchen atenolol (TENORMIN) 25 MG tablet Take 1 tablet (25 mg total) by mouth daily. Must keep upcoming office visit for refills.  Marland Kitchen atorvastatin (LIPITOR) 40 MG tablet TAKE 1 TABLET BY MOUTH DAILY AT 6 PM  .  Blood Glucose Monitoring Suppl (ACCU-CHEK GUIDE) w/Device KIT 1 each by Does not apply route 3 (three) times daily. Use as directed to test blood sugar three times daily DX E11.65  . CALCIUM 500/D 500-200 MG-UNIT tablet TAKE 1 TABLET BY MOUTH DAILY  . cephALEXin (KEFLEX) 500 MG capsule Take 1 capsule (500 mg total) by mouth 2 (two) times daily. (Patient not taking: Reported on 06/09/2019)  . cetirizine (ZYRTEC) 10 MG tablet Take 1 tablet (10 mg total) by mouth daily.  . famotidine (PEPCID) 20 MG tablet TAKE 1  TABLET(20 MG) BY MOUTH AT BEDTIME  . glucose blood (ACCU-CHEK GUIDE) test strip Use as directed to test blood sugar three times daily DX E11.65  . guaiFENesin (MUCINEX PO) Take by mouth.  . hydrochlorothiazide (HYDRODIURIL) 25 MG tablet TAKE 1 TABLET(25 MG) BY MOUTH DAILY  . insulin aspart protamine- aspart (NOVOLOG MIX 70/30) (70-30) 100 UNIT/ML injection INJECT 80 UNITS UNDER THE SKIN IN THE MORNING AND 50 UNITS IN THE EVENING WITH MEALS  . INSULIN SYRINGE 1CC/29G 29G X 1/2" 1 ML MISC USE THREE TIMES DAILY AS DIRECTED  . losartan (COZAAR) 50 MG tablet TAKE 1 TABLET(50 MG) BY MOUTH DAILY  . mometasone-formoterol (DULERA) 100-5 MCG/ACT AERO Inhale 2 puffs into the lungs 2 (two) times daily.  . montelukast (SINGULAIR) 10 MG tablet TAKE 1 TABLET(10 MG) BY MOUTH DAILY  . Multiple Vitamin (MULTIVITAMIN WITH MINERALS) TABS tablet Take 1 tablet by mouth daily.  . nitroGLYCERIN (NITROSTAT) 0.3 MG SL tablet Place 1 tablet (0.3 mg total) under the tongue every 5 (five) minutes as needed for chest pain. Max 3 tablets in 15 minutes  . ondansetron (ZOFRAN ODT) 4 MG disintegrating tablet Take 1 tablet (4 mg total) by mouth every 8 (eight) hours as needed for nausea or vomiting. (Patient not taking: Reported on 06/09/2019)  . pantoprazole (PROTONIX) 40 MG tablet Take 1 tablet (40 mg total) by mouth daily. Must have office visit for refills  . phenazopyridine (PYRIDIUM) 200 MG tablet Take 1 tablet (200 mg total) by mouth 3 (three) times daily. (Patient not taking: Reported on 06/09/2019)  . Potassium Chloride ER 20 MEQ TBCR TAKE 1 TABLET BY MOUTH DAILY  . rizatriptan (MAXALT) 10 MG tablet Take 1 tablet (10 mg total) by mouth as needed for migraine. May repeat in 2 hours if needed. MAX 2 TABLETS PER 24 HOURS.  . sitaGLIPtin (JANUVIA) 50 MG tablet Take 1 tablet (50 mg total) by mouth daily.  Marland Kitchen topiramate (TOPAMAX) 25 MG capsule Take 3 capsules (75 mg total) by mouth 2 (two) times daily.   No facility-administered  encounter medications on file as of 06/09/2019.     Observations/Objective:   Chemistry      Component Value Date/Time   NA 142 02/07/2019 1320   NA 141 08/22/2018 1137   K 3.5 02/07/2019 1320   CL 104 02/07/2019 1320   CO2 28 02/07/2019 1320   BUN 15 02/07/2019 1320   BUN 18 08/22/2018 1137   CREATININE 1.03 (H) 02/07/2019 1320   CREATININE 1.14 (H) 05/12/2016 1648      Component Value Date/Time   CALCIUM 9.5 02/07/2019 1320   ALKPHOS 117 05/10/2018 0840   AST 25 05/10/2018 0840   ALT 32 05/10/2018 0840   BILITOT <0.2 05/10/2018 0840     Lab Results  Component Value Date   WBC 7.9 02/07/2019   HGB 13.8 02/07/2019   HCT 44.1 02/07/2019   MCV 88.6 02/07/2019   PLT 242 02/07/2019  Assessment and Plan: 1. 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  She will continue current dose of insulin.  I have removed Januvia from the list Encourage her to continue healthy eating habits.  Encouraged her to try to move more Reminded patient to schedule her eye exam.  Also reminded her to come to the lab to have A1c drawn. - Hemoglobin A1c; Future  2. Essential hypertension 2 of her 3 most recent blood pressure readings are good.  No changes made in medications.  Continue low-salt diet - losartan (COZAAR) 50 MG tablet; Take 1 tablet (50 mg total) by mouth daily.  Dispense: 30 tablet; Refill: 11 - atenolol (TENORMIN) 25 MG tablet; Take 1 tablet (25 mg total) by mouth daily. Must keep upcoming office visit for refills.  Dispense: 90 tablet; Refill: 3  3. Hyperlipidemia associated with type 2 diabetes mellitus (HCC) Continue atorvastatin - Hepatic Function Panel; Future - Lipid panel; Future - atorvastatin (LIPITOR) 40 MG tablet; TAKE 1 TABLET BY MOUTH DAILY AT 6 PM  Dispense: 30 tablet; Refill: 11  4.  Chronic diastolic congestive heart failure (HCC) Clinically stable.  5. Chronic neck pain Advised patient that we do not prescribe hydrocodone or Percocet at this practice.  I am agreeable to giving her a limited prescription for tramadol to use as needed until she can get back in with her pain specialist.  [facility] controlled substance reporting system reviewed. - traMADol (ULTRAM) 50 MG tablet; Take 1 tablet (50 mg total) by mouth every 8 (eight) hours as needed.  Dispense: 30 tablet; Refill: 0  6. Gastroesophageal reflux disease without esophagitis - famotidine (PEPCID) 20 MG tablet; TAKE 1 TABLET(20 MG) BY MOUTH AT BEDTIME  Dispense: 90 tablet; Refill: 2 - pantoprazole (PROTONIX) 40 MG tablet; Take 1 tablet (40 mg total) by mouth daily. Must have office visit for refills  Dispense: 30 tablet; Refill: 6   Follow Up Instructions: 3-4 mths   I discussed the assessment and treatment plan with the patient. The patient was provided an opportunity to ask questions and all were answered. The patient agreed with the plan and demonstrated an understanding of the instructions.   The patient was advised to call back or seek an in-person evaluation if the symptoms worsen or if the condition fails to improve as anticipated.  I provided 19 minutes of non-face-to-face time during this encounter.   Karle Plumber, MD

## 2019-06-14 ENCOUNTER — Ambulatory Visit: Payer: Medicare HMO | Attending: Internal Medicine

## 2019-06-14 ENCOUNTER — Other Ambulatory Visit: Payer: Self-pay

## 2019-06-14 DIAGNOSIS — N183 Chronic kidney disease, stage 3 unspecified: Secondary | ICD-10-CM | POA: Diagnosis not present

## 2019-06-14 DIAGNOSIS — Z794 Long term (current) use of insulin: Secondary | ICD-10-CM

## 2019-06-14 DIAGNOSIS — E785 Hyperlipidemia, unspecified: Secondary | ICD-10-CM | POA: Diagnosis not present

## 2019-06-14 DIAGNOSIS — E1169 Type 2 diabetes mellitus with other specified complication: Secondary | ICD-10-CM | POA: Diagnosis not present

## 2019-06-14 DIAGNOSIS — E11319 Type 2 diabetes mellitus with unspecified diabetic retinopathy without macular edema: Secondary | ICD-10-CM

## 2019-06-15 ENCOUNTER — Telehealth: Payer: Self-pay | Admitting: *Deleted

## 2019-06-15 DIAGNOSIS — I10 Essential (primary) hypertension: Secondary | ICD-10-CM

## 2019-06-15 LAB — BASIC METABOLIC PANEL
BUN/Creatinine Ratio: 11 — ABNORMAL LOW (ref 12–28)
BUN: 12 mg/dL (ref 8–27)
CO2: 25 mmol/L (ref 20–29)
Calcium: 9.5 mg/dL (ref 8.7–10.3)
Chloride: 105 mmol/L (ref 96–106)
Creatinine, Ser: 1.08 mg/dL — ABNORMAL HIGH (ref 0.57–1.00)
GFR calc Af Amer: 61 mL/min/{1.73_m2} (ref >59)
GFR calc non Af Amer: 53 mL/min/{1.73_m2} — ABNORMAL LOW (ref >59)
Glucose: 102 mg/dL — ABNORMAL HIGH (ref 65–99)
Potassium: 4 mmol/L (ref 3.5–5.2)
Sodium: 144 mmol/L (ref 134–144)

## 2019-06-15 LAB — HEMOGLOBIN A1C
Est. average glucose Bld gHb Est-mCnc: 226 mg/dL
Hgb A1c MFr Bld: 9.5 % — ABNORMAL HIGH (ref 4.8–5.6)

## 2019-06-15 LAB — LIPID PANEL
Chol/HDL Ratio: 2.1 ratio (ref 0.0–4.4)
Cholesterol, Total: 118 mg/dL (ref 100–199)
HDL: 56 mg/dL (ref >39)
LDL Chol Calc (NIH): 37 mg/dL (ref 0–99)
Triglycerides: 148 mg/dL (ref 0–149)
VLDL Cholesterol Cal: 25 mg/dL (ref 5–40)

## 2019-06-15 LAB — HEPATIC FUNCTION PANEL
ALT: 16 IU/L (ref 0–32)
AST: 17 IU/L (ref 0–40)
Albumin: 4.1 g/dL (ref 3.8–4.8)
Alkaline Phosphatase: 130 IU/L — ABNORMAL HIGH (ref 39–117)
Bilirubin Total: 0.2 mg/dL (ref 0.0–1.2)
Bilirubin, Direct: 0.09 mg/dL (ref 0.00–0.40)
Total Protein: 6.4 g/dL (ref 6.0–8.5)

## 2019-06-15 MED ORDER — POTASSIUM CHLORIDE ER 20 MEQ PO TBCR: 1.0000 | EXTENDED_RELEASE_TABLET | Freq: Every day | ORAL | 5 refills | Status: DC

## 2019-06-15 MED ORDER — INSULIN ASPART PROT & ASPART (70-30 MIX) 100 UNIT/ML ~~LOC~~ SUSP: SUBCUTANEOUS | 6 refills | Status: DC

## 2019-06-15 NOTE — Telephone Encounter (Signed)
Refills sent on insulin and potassium.

## 2019-06-15 NOTE — Progress Notes (Signed)
LMOM

## 2019-06-15 NOTE — Telephone Encounter (Signed)
Patient is stating that her pharmacy stated that provider put restrictions on her insulin Novolog 70-30 and told her that she needed to get her labs completed first before she can get her insulin. Per pt now that she has completed her labs, can provider please send over another script for Novolog 70-30. Patient also stated that she is out of her Potassium and would like refills for this as well.

## 2019-06-15 NOTE — Addendum Note (Signed)
Addended by: Karle Plumber B on: 06/15/2019 09:34 PM   Modules accepted: Orders

## 2019-06-17 ENCOUNTER — Telehealth: Payer: Self-pay | Admitting: Internal Medicine

## 2019-06-17 MED ORDER — METFORMIN HCL 500 MG PO TABS: 250.0000 mg | ORAL_TABLET | Freq: Every day | ORAL | 3 refills | Status: DC

## 2019-06-17 NOTE — Telephone Encounter (Signed)
PC placed to pt this a.m regarding her response to referral to see endocrinology.  Pt unable to afford at this time.  I went over A1C with her. It did not improve from 08/2018.  I inquired whether she would be willing to try low dose of Metformin and she said she would.  Thinks she was on Metformin in the past and did not do well with it but willing to try low dose.  Will let me know if she has any problems.  Most recent GFR was 61.  Rxn sent to pharmacy for Metformin.

## 2019-06-17 NOTE — Telephone Encounter (Signed)
-----   Message from Debbra Riding, RMA sent at 06/15/2019  3:35 PM EST ----- Informed patient with results and she verbalized understanding.   Per pt she is not able to go to the referral place PCP is referring her to due to it will cause her $45 and she can not afford it right now. Per pt she have other specialty offices to go to and right now she can not afford to go to an Endocrinologist.

## 2019-07-03 ENCOUNTER — Other Ambulatory Visit: Payer: Self-pay

## 2019-07-03 ENCOUNTER — Ambulatory Visit (INDEPENDENT_AMBULATORY_CARE_PROVIDER_SITE_OTHER): Payer: Medicare HMO | Admitting: Neurology

## 2019-07-03 VITALS — BP 125/81 | HR 83 | Temp 97.1°F

## 2019-07-03 DIAGNOSIS — G43709 Chronic migraine without aura, not intractable, without status migrainosus: Secondary | ICD-10-CM

## 2019-07-03 NOTE — Progress Notes (Signed)
Interval history 07/03/2019: Patient with chronic intractable migraines.  Still doing exceptionally well on Botox for Migraines. Prior tried multiple classes of medications which did not help with headaches or migraines until she started Botox. Since starting botox she has had  >90% improvement in frequency and severity. The migraines she does have are sparse, are when botox is wearing off and are easier to treat and >60% less severe.Nothing has worked in the past except botox and Topiramate.  NO temples, no masseters, no LS, no eyes  Consent Form Botulism Toxin Injection For Chronic Migraine    Reviewed orally with patient, additionally signature is on file:  Botulism toxin has been approved by the Federal drug administration for treatment of chronic migraine. Botulism toxin does not cure chronic migraine and it may not be effective in some patients.  The administration of botulism toxin is accomplished by injecting a small amount of toxin into the muscles of the neck and head. Dosage must be titrated for each individual. Any benefits resulting from botulism toxin tend to wear off after 3 months with a repeat injection required if benefit is to be maintained. Injections are usually done every 3-4 months with maximum effect peak achieved by about 2 or 3 weeks. Botulism toxin is expensive and you should be sure of what costs you will incur resulting from the injection.  The side effects of botulism toxin use for chronic migraine may include:   -Transient, and usually mild, facial weakness with facial injections  -Transient, and usually mild, head or neck weakness with head/neck injections  -Reduction or loss of forehead facial animation due to forehead muscle weakness  -Eyelid drooping  -Dry eye  -Pain at the site of injection or bruising at the site of injection  -Double vision  -Potential unknown long term risks  Contraindications: You should not have Botox if you are pregnant, nursing,  allergic to albumin, have an infection, skin condition, or muscle weakness at the site of the injection, or have myasthenia gravis, Lambert-Eaton syndrome, or ALS.  It is also possible that as with any injection, there may be an allergic reaction or no effect from the medication. Reduced effectiveness after repeated injections is sometimes seen and rarely infection at the injection site may occur. All care will be taken to prevent these side effects. If therapy is given over a long time, atrophy and wasting in the muscle injected may occur. Occasionally the patient's become refractory to treatment because they develop antibodies to the toxin. In this event, therapy needs to be modified.  I have read the above information and consent to the administration of botulism toxin.    BOTOX PROCEDURE NOTE FOR MIGRAINE HEADACHE    Contraindications and precautions discussed with patient(above). Aseptic procedure was observed and patient tolerated procedure. Procedure performed by Dr. Georgia Dom  The condition has existed for more than 6 months, and pt does not have a diagnosis of ALS, Myasthenia Gravis or Lambert-Eaton Syndrome.  Risks and benefits of injections discussed and pt agrees to proceed with the procedure.  Written consent obtained  These injections are medically necessary. Pt  receives good benefits from these injections. These injections do not cause sedations or hallucinations which the oral therapies may cause.  Description of procedure:  The patient was placed in a sitting position. The standard protocol was used for Botox as follows, with 5 units of Botox injected at each site:   -Procerus muscle, midline injection  -Corrugator muscle, bilateral injection  -Frontalis muscle, bilateral  injection, with 2 sites each side, medial injection was performed in the upper one third of the frontalis muscle, in the region vertical from the medial inferior edge of the superior orbital rim. The  lateral injection was again in the upper one third of the forehead vertically above the lateral limbus of the cornea, 1.5 cm lateral to the medial injection site.  -Temporalis muscle injection, 4 sites, bilaterally. The first injection was 3 cm above the tragus of the ear, second injection site was 1.5 cm to 3 cm up from the first injection site in line with the tragus of the ear. The third injection site was 1.5-3 cm forward between the first 2 injection sites. The fourth injection site was 1.5 cm posterior to the second injection site.   -Occipitalis muscle injection, 3 sites, bilaterally. The first injection was done one half way between the occipital protuberance and the tip of the mastoid process behind the ear. The second injection site was done lateral and superior to the first, 1 fingerbreadth from the first injection. The third injection site was 1 fingerbreadth superiorly and medially from the first injection site.  -Cervical paraspinal muscle injection, 2 sites, bilateral knee first injection site was 1 cm from the midline of the cervical spine, 3 cm inferior to the lower border of the occipital protuberance. The second injection site was 1.5 cm superiorly and laterally to the first injection site.  -Trapezius muscle injection was performed at 3 sites, bilaterally. The first injection site was in the upper trapezius muscle halfway between the inflection point of the neck, and the acromion. The second injection site was one half way between the acromion and the first injection site. The third injection was done between the first injection site and the inflection point of the neck.   Will return for repeat injection in 3 months.   200 units of Botox was used, any Botox not injected was wasted. The patient tolerated the procedure well, there were no complications of the above procedure.

## 2019-07-03 NOTE — Progress Notes (Signed)
Botox 200 units = 1 vial Lot: [id number]  Exp date: 03/2022 NDC: 0023-3921-02  Bacteriostatic NS 0.9% injection Lot# [id number] Exp: 01/2020 NDC: [id number]  DX: G43.709 B/B

## 2019-07-18 ENCOUNTER — Other Ambulatory Visit: Payer: Self-pay | Admitting: Internal Medicine

## 2019-07-18 DIAGNOSIS — IMO0002 Reserved for concepts with insufficient information to code with codable children: Secondary | ICD-10-CM

## 2019-07-18 DIAGNOSIS — E1165 Type 2 diabetes mellitus with hyperglycemia: Secondary | ICD-10-CM

## 2019-07-24 ENCOUNTER — Telehealth: Payer: Self-pay | Admitting: Neurology

## 2019-07-24 NOTE — Telephone Encounter (Signed)
Pt called wanting to speak to Rn about her BOTOX inj. Pt states she is having some sort of reaction but would not go in to details. Please advise.

## 2019-07-24 NOTE — Telephone Encounter (Signed)
Let's reinject with samples.

## 2019-07-24 NOTE — Telephone Encounter (Signed)
Spoke with pt. Scheduled her for appt with Dr. Jaynee Eagles to reinject with samples this Wed 1/20 @ 8:00 AM arrival a few minutes before.

## 2019-07-24 NOTE — Telephone Encounter (Signed)
I spoke with the patient. She has been having headaches since the botox was administered and she feels like she "didn't get any". Pt said it's not feeling the same as it usually feels after the injections. She would like a call back from Dr. Jaynee Eagles. She verbalized appreciation for the call.
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
## 2019-07-26 ENCOUNTER — Other Ambulatory Visit: Payer: Self-pay

## 2019-07-26 ENCOUNTER — Ambulatory Visit (INDEPENDENT_AMBULATORY_CARE_PROVIDER_SITE_OTHER): Payer: Self-pay | Admitting: Neurology

## 2019-07-26 VITALS — Temp 98.3°F

## 2019-07-26 DIAGNOSIS — G43709 Chronic migraine without aura, not intractable, without status migrainosus: Secondary | ICD-10-CM

## 2019-07-26 NOTE — Progress Notes (Signed)
Botox- 200 units x 1 vial  Lot: [id number] Expiration: 01/2022 NDC: [id number]  Bacteriostatic 0.9% Sodium Chloride- 59mL total Lot: [id number] Expiration: 10/05/2019 NDC: [id number]  Dx: [id number] samples

## 2019-07-26 NOTE — Progress Notes (Signed)
NO CHARGE, repeat botox due to last injections not working unclear etiology we did mix them with 50ml of saline as opposed to 4 but the number of units per injection site was the same. Possibly the botox was faulty, I apologized.   Interval history 07/03/2019: Patient with chronic intractable migraines.  Still doing exceptionally well on Botox for Migraines. Prior tried multiple classes of medications which did not help with headaches or migraines until she started Botox. Since starting botox she has had  >90% improvement in frequency and severity. The migraines she does have are sparse, are when botox is wearing off and are easier to treat and >60% less severe.Nothing has worked in the past except botox and Topiramate.  NO temples, no masseters, no LS, no eyes  Consent Form Botulism Toxin Injection For Chronic Migraine    Reviewed orally with patient, additionally signature is on file:  Botulism toxin has been approved by the Federal drug administration for treatment of chronic migraine. Botulism toxin does not cure chronic migraine and it may not be effective in some patients.  The administration of botulism toxin is accomplished by injecting a small amount of toxin into the muscles of the neck and head. Dosage must be titrated for each individual. Any benefits resulting from botulism toxin tend to wear off after 3 months with a repeat injection required if benefit is to be maintained. Injections are usually done every 3-4 months with maximum effect peak achieved by about 2 or 3 weeks. Botulism toxin is expensive and you should be sure of what costs you will incur resulting from the injection.  The side effects of botulism toxin use for chronic migraine may include:   -Transient, and usually mild, facial weakness with facial injections  -Transient, and usually mild, head or neck weakness with head/neck injections  -Reduction or loss of forehead facial animation due to forehead muscle  weakness  -Eyelid drooping  -Dry eye  -Pain at the site of injection or bruising at the site of injection  -Double vision  -Potential unknown long term risks  Contraindications: You should not have Botox if you are pregnant, nursing, allergic to albumin, have an infection, skin condition, or muscle weakness at the site of the injection, or have myasthenia gravis, Lambert-Eaton syndrome, or ALS.  It is also possible that as with any injection, there may be an allergic reaction or no effect from the medication. Reduced effectiveness after repeated injections is sometimes seen and rarely infection at the injection site may occur. All care will be taken to prevent these side effects. If therapy is given over a long time, atrophy and wasting in the muscle injected may occur. Occasionally the patient's become refractory to treatment because they develop antibodies to the toxin. In this event, therapy needs to be modified.  I have read the above information and consent to the administration of botulism toxin.    BOTOX PROCEDURE NOTE FOR MIGRAINE HEADACHE    Contraindications and precautions discussed with patient(above). Aseptic procedure was observed and patient tolerated procedure. Procedure performed by Dr. Georgia Dom  The condition has existed for more than 6 months, and pt does not have a diagnosis of ALS, Myasthenia Gravis or Lambert-Eaton Syndrome.  Risks and benefits of injections discussed and pt agrees to proceed with the procedure.  Written consent obtained  These injections are medically necessary. Pt  receives good benefits from these injections. These injections do not cause sedations or hallucinations which the oral therapies may cause.  Description  of procedure:  The patient was placed in a sitting position. The standard protocol was used for Botox as follows, with 5 units of Botox injected at each site:   -Procerus muscle, midline injection  -Corrugator muscle, bilateral  injection  -Frontalis muscle, bilateral injection, with 2 sites each side, medial injection was performed in the upper one third of the frontalis muscle, in the region vertical from the medial inferior edge of the superior orbital rim. The lateral injection was again in the upper one third of the forehead vertically above the lateral limbus of the cornea, 1.5 cm lateral to the medial injection site.  -Temporalis muscle injection, 4 sites, bilaterally. The first injection was 3 cm above the tragus of the ear, second injection site was 1.5 cm to 3 cm up from the first injection site in line with the tragus of the ear. The third injection site was 1.5-3 cm forward between the first 2 injection sites. The fourth injection site was 1.5 cm posterior to the second injection site.   -Occipitalis muscle injection, 3 sites, bilaterally. The first injection was done one half way between the occipital protuberance and the tip of the mastoid process behind the ear. The second injection site was done lateral and superior to the first, 1 fingerbreadth from the first injection. The third injection site was 1 fingerbreadth superiorly and medially from the first injection site.  -Cervical paraspinal muscle injection, 2 sites, bilateral knee first injection site was 1 cm from the midline of the cervical spine, 3 cm inferior to the lower border of the occipital protuberance. The second injection site was 1.5 cm superiorly and laterally to the first injection site.  -Trapezius muscle injection was performed at 3 sites, bilaterally. The first injection site was in the upper trapezius muscle halfway between the inflection point of the neck, and the acromion. The second injection site was one half way between the acromion and the first injection site. The third injection was done between the first injection site and the inflection point of the neck.   Will return for repeat injection in 3 months.   200 units of Botox was  used, any Botox not injected was wasted. The patient tolerated the procedure well, there were no complications of the above procedure.

## 2019-08-09 ENCOUNTER — Other Ambulatory Visit: Payer: Self-pay | Admitting: Internal Medicine

## 2019-08-24 ENCOUNTER — Emergency Department (HOSPITAL_COMMUNITY): Payer: Medicare (Managed Care)

## 2019-08-24 ENCOUNTER — Inpatient Hospital Stay (HOSPITAL_COMMUNITY)
Admission: EM | Admit: 2019-08-24 | Discharge: 2019-08-26 | DRG: 313 | Disposition: A | Discharge status: Home / Self Care | Payer: Medicare (Managed Care) | Attending: Internal Medicine | Admitting: Internal Medicine

## 2019-08-24 DIAGNOSIS — R079 Chest pain, unspecified: Secondary | ICD-10-CM | POA: Diagnosis not present

## 2019-08-24 DIAGNOSIS — Z7951 Long term (current) use of inhaled steroids: Secondary | ICD-10-CM

## 2019-08-24 DIAGNOSIS — I959 Hypotension, unspecified: Secondary | ICD-10-CM | POA: Diagnosis not present

## 2019-08-24 DIAGNOSIS — Z7982 Long term (current) use of aspirin: Secondary | ICD-10-CM

## 2019-08-24 DIAGNOSIS — I110 Hypertensive heart disease with heart failure: Secondary | ICD-10-CM | POA: Diagnosis present

## 2019-08-24 DIAGNOSIS — E119 Type 2 diabetes mellitus without complications: Secondary | ICD-10-CM | POA: Diagnosis present

## 2019-08-24 DIAGNOSIS — J45909 Unspecified asthma, uncomplicated: Secondary | ICD-10-CM | POA: Diagnosis present

## 2019-08-24 DIAGNOSIS — K219 Gastro-esophageal reflux disease without esophagitis: Secondary | ICD-10-CM | POA: Diagnosis present

## 2019-08-24 DIAGNOSIS — E785 Hyperlipidemia, unspecified: Secondary | ICD-10-CM | POA: Diagnosis present

## 2019-08-24 DIAGNOSIS — R0902 Hypoxemia: Secondary | ICD-10-CM | POA: Diagnosis present

## 2019-08-24 DIAGNOSIS — I5032 Chronic diastolic (congestive) heart failure: Secondary | ICD-10-CM | POA: Diagnosis present

## 2019-08-24 DIAGNOSIS — E669 Obesity, unspecified: Secondary | ICD-10-CM | POA: Diagnosis present

## 2019-08-24 DIAGNOSIS — G4733 Obstructive sleep apnea (adult) (pediatric): Secondary | ICD-10-CM | POA: Diagnosis present

## 2019-08-24 DIAGNOSIS — Z20822 Contact with and (suspected) exposure to covid-19: Secondary | ICD-10-CM | POA: Diagnosis present

## 2019-08-24 DIAGNOSIS — Z800 Family history of malignant neoplasm of digestive organs: Secondary | ICD-10-CM

## 2019-08-24 DIAGNOSIS — Z888 Allergy status to other drugs, medicaments and biological substances status: Secondary | ICD-10-CM

## 2019-08-24 DIAGNOSIS — G43909 Migraine, unspecified, not intractable, without status migrainosus: Secondary | ICD-10-CM | POA: Diagnosis present

## 2019-08-24 DIAGNOSIS — E11319 Type 2 diabetes mellitus with unspecified diabetic retinopathy without macular edema: Secondary | ICD-10-CM | POA: Diagnosis present

## 2019-08-24 DIAGNOSIS — R0789 Other chest pain: Secondary | ICD-10-CM | POA: Diagnosis present

## 2019-08-24 DIAGNOSIS — Z6841 Body Mass Index (BMI) 40.0 and over, adult: Secondary | ICD-10-CM

## 2019-08-24 DIAGNOSIS — H1010 Acute atopic conjunctivitis, unspecified eye: Secondary | ICD-10-CM | POA: Diagnosis present

## 2019-08-24 DIAGNOSIS — E7800 Pure hypercholesterolemia, unspecified: Secondary | ICD-10-CM | POA: Diagnosis present

## 2019-08-24 DIAGNOSIS — Z833 Family history of diabetes mellitus: Secondary | ICD-10-CM

## 2019-08-24 DIAGNOSIS — Z794 Long term (current) use of insulin: Secondary | ICD-10-CM

## 2019-08-24 LAB — TROPONIN I (HIGH SENSITIVITY)
Troponin I (High Sensitivity): 3 ng/L (ref <18)
Troponin I (High Sensitivity): 3 ng/L (ref <18)

## 2019-08-24 LAB — BASIC METABOLIC PANEL
Anion gap: 10 (ref 5–15)
BUN: 10 mg/dL (ref 8–23)
CO2: 30 mmol/L (ref 22–32)
Calcium: 9.4 mg/dL (ref 8.9–10.3)
Chloride: 99 mmol/L (ref 98–111)
Creatinine, Ser: 1.12 mg/dL — ABNORMAL HIGH (ref 0.44–1.00)
GFR calc Af Amer: 59 mL/min — ABNORMAL LOW (ref >60)
GFR calc non Af Amer: 51 mL/min — ABNORMAL LOW (ref >60)
Glucose, Bld: 170 mg/dL — ABNORMAL HIGH (ref 70–99)
Potassium: 3.7 mmol/L (ref 3.5–5.1)
Sodium: 139 mmol/L (ref 135–145)

## 2019-08-24 LAB — CBC WITH DIFFERENTIAL/PLATELET
Abs Immature Granulocytes: 0.03 10*3/uL (ref 0.00–0.07)
Basophils Absolute: 0.1 10*3/uL (ref 0.0–0.1)
Basophils Relative: 1 %
Eosinophils Absolute: 0.3 10*3/uL (ref 0.0–0.5)
Eosinophils Relative: 4 %
HCT: 46.9 % — ABNORMAL HIGH (ref 36.0–46.0)
Hemoglobin: 14.6 g/dL (ref 12.0–15.0)
Immature Granulocytes: 0 %
Lymphocytes Relative: 43 %
Lymphs Abs: 3.5 10*3/uL (ref 0.7–4.0)
MCH: 27.2 pg (ref 26.0–34.0)
MCHC: 31.1 g/dL (ref 30.0–36.0)
MCV: 87.3 fL (ref 80.0–100.0)
Monocytes Absolute: 0.6 10*3/uL (ref 0.1–1.0)
Monocytes Relative: 7 %
Neutro Abs: 3.6 10*3/uL (ref 1.7–7.7)
Neutrophils Relative %: 45 %
Platelets: 194 10*3/uL (ref 150–400)
RBC: 5.37 MIL/uL — ABNORMAL HIGH (ref 3.87–5.11)
RDW: 15.1 % (ref 11.5–15.5)
WBC: 8.1 10*3/uL (ref 4.0–10.5)
nRBC: 0 % (ref 0.0–0.2)

## 2019-08-24 LAB — POC SARS CORONAVIRUS 2 AG -  ED: SARS Coronavirus 2 Ag: NEGATIVE

## 2019-08-24 LAB — BRAIN NATRIURETIC PEPTIDE: B Natriuretic Peptide: 22.1 pg/mL (ref 0.0–100.0)

## 2019-08-24 LAB — D-DIMER, QUANTITATIVE: D-Dimer, Quant: 0.45 ug/mL-FEU (ref 0.00–0.50)

## 2019-08-24 MED ORDER — SUMATRIPTAN SUCCINATE 50 MG PO TABS: 50.0000 mg | ORAL_TABLET | Freq: Two times a day (BID) | ORAL | Status: DC | PRN

## 2019-08-24 MED ORDER — ALBUTEROL SULFATE (2.5 MG/3ML) 0.083% IN NEBU
3.0000 mL | INHALATION_SOLUTION | RESPIRATORY_TRACT | Status: DC | PRN
  Administered 2019-08-26: 3 mL via RESPIRATORY_TRACT
  Filled 2019-08-24: qty 3

## 2019-08-24 MED ORDER — ATORVASTATIN CALCIUM 40 MG PO TABS
40.0000 mg | ORAL_TABLET | Freq: Every day | ORAL | Status: DC
  Administered 2019-08-25: 40 mg via ORAL
  Filled 2019-08-24: qty 1

## 2019-08-24 MED ORDER — TOPIRAMATE 25 MG PO TABS
75.0000 mg | ORAL_TABLET | Freq: Two times a day (BID) | ORAL | Status: DC
  Administered 2019-08-24 – 2019-08-26 (×4): 75 mg via ORAL
  Filled 2019-08-24 (×5): qty 3

## 2019-08-24 MED ORDER — MOMETASONE FURO-FORMOTEROL FUM 100-5 MCG/ACT IN AERO
2.0000 | INHALATION_SPRAY | Freq: Two times a day (BID) | RESPIRATORY_TRACT | Status: DC
  Administered 2019-08-24 – 2019-08-26 (×4): 2 via RESPIRATORY_TRACT
  Filled 2019-08-24: qty 8.8

## 2019-08-24 MED ORDER — SENNOSIDES-DOCUSATE SODIUM 8.6-50 MG PO TABS: 1.0000 | ORAL_TABLET | Freq: Every evening | ORAL | Status: DC | PRN

## 2019-08-24 MED ORDER — ATENOLOL 25 MG PO TABS
25.0000 mg | ORAL_TABLET | Freq: Every day | ORAL | Status: DC
  Administered 2019-08-25: 25 mg via ORAL
  Filled 2019-08-24: qty 1

## 2019-08-24 MED ORDER — PANTOPRAZOLE SODIUM 40 MG PO TBEC
40.0000 mg | DELAYED_RELEASE_TABLET | Freq: Every day | ORAL | Status: DC
  Administered 2019-08-25 – 2019-08-26 (×3): 40 mg via ORAL
  Filled 2019-08-24 (×3): qty 1

## 2019-08-24 MED ORDER — NITROGLYCERIN 0.4 MG SL SUBL: 0.4000 mg | SUBLINGUAL_TABLET | SUBLINGUAL | Status: DC | PRN

## 2019-08-24 MED ORDER — ASPIRIN EC 81 MG PO TBEC
81.0000 mg | DELAYED_RELEASE_TABLET | Freq: Every day | ORAL | Status: DC
  Administered 2019-08-25 – 2019-08-26 (×2): 81 mg via ORAL
  Filled 2019-08-24 (×2): qty 1

## 2019-08-24 MED ORDER — FUROSEMIDE 10 MG/ML IJ SOLN
40.0000 mg | Freq: Once | INTRAMUSCULAR | Status: AC
  Filled 2019-08-24: qty 4

## 2019-08-24 MED ORDER — ENOXAPARIN SODIUM 40 MG/0.4ML ~~LOC~~ SOLN
40.0000 mg | SUBCUTANEOUS | Status: DC
  Administered 2019-08-25 – 2019-08-26 (×2): 40 mg via SUBCUTANEOUS
  Filled 2019-08-24 (×2): qty 0.4

## 2019-08-24 MED ORDER — FUROSEMIDE 10 MG/ML IJ SOLN
INTRAMUSCULAR | Status: AC
  Administered 2019-08-24: 40 mg via INTRAVENOUS
  Filled 2019-08-24: qty 4

## 2019-08-24 MED ORDER — SODIUM CHLORIDE 0.9 % IV BOLUS
500.0000 mL | Freq: Once | INTRAVENOUS | Status: AC
  Administered 2019-08-24: 500 mL via INTRAVENOUS

## 2019-08-24 MED ORDER — ASPIRIN 81 MG PO CHEW
324.0000 mg | CHEWABLE_TABLET | Freq: Once | ORAL | Status: AC
  Administered 2019-08-24: 324 mg via ORAL
  Filled 2019-08-24: qty 4

## 2019-08-24 MED ORDER — LOSARTAN POTASSIUM 50 MG PO TABS
50.0000 mg | ORAL_TABLET | Freq: Every day | ORAL | Status: DC
  Administered 2019-08-25: 50 mg via ORAL
  Filled 2019-08-24: qty 1

## 2019-08-24 MED ORDER — INSULIN GLARGINE 100 UNIT/ML ~~LOC~~ SOLN
40.0000 [IU] | Freq: Every day | SUBCUTANEOUS | Status: DC
  Administered 2019-08-25: 40 [IU] via SUBCUTANEOUS
  Filled 2019-08-24 (×3): qty 0.4

## 2019-08-24 MED ORDER — INSULIN ASPART 100 UNIT/ML ~~LOC~~ SOLN
0.0000 [IU] | Freq: Three times a day (TID) | SUBCUTANEOUS | Status: DC
  Administered 2019-08-25: 7 [IU] via SUBCUTANEOUS
  Administered 2019-08-25: 15 [IU] via SUBCUTANEOUS
  Administered 2019-08-25: 7 [IU] via SUBCUTANEOUS
  Administered 2019-08-26: 20 [IU] via SUBCUTANEOUS
  Administered 2019-08-26: 15 [IU] via SUBCUTANEOUS

## 2019-08-24 NOTE — ED Provider Notes (Signed)
[facility] EMERGENCY DEPARTMENT Provider Note   CSN: [id number] Arrival date & time: 08/24/19  1855     History Chief Complaint  Patient presents with  . Chest Pain    Heidi [name] is a 69 y.o. female.  HPI 69 year old female presents with chest tightness/pressure.  Patient states this started sometime before noon.  Pre much constant though gets worse at certain times.  Minimal exertion makes it worse.  No vomiting.  Feels like the pain is somewhat starting to radiate towards left shoulder.  Some chronic cough.  Is also having shortness of breath with this.  No pleuritic symptoms.  She feels like she gets cramps since this started. Pain is an 8/10.  She is also noticing some leg swelling at her ankles. No pleuritic pain.   Past Medical History:  Diagnosis Date  . Adenomatous colon polyp   . Anemia   . Anxiety   . Asthma   . CHF (congestive heart failure) ([place])   . Diabetes mellitus without complication ([place])   . High cholesterol   . Hypertension   . Left knee injury   . Pneumonia     Patient Active Problem List   Diagnosis Date Noted  . Cervical myelopathy ([place]) 08/16/2018  . Diabetic retinopathy of right eye associated with type 2 diabetes mellitus ([place]) 08/16/2018  . Upper airway cough syndrome 05/03/2018  . Morbid obesity due to excess calories ([place]) complicated by hbp/ dm / hyperlipidemia 09/10/2017  . Cough variant asthma vs UACS/vcd on ACEi  09/09/2017  . Anxiety and depression 09/06/2017  . Mild persistent asthma without complication 54/62/7035  . Paranoia ([place]) 09/06/2017  . Torn left ear lobe 11/20/2016  . Chronic migraine w/o aura w/o status migrainosus, not intractable 06/27/2016  . Post-menopausal atrophic vaginitis 11/14/2015  . Primary osteoarthritis of both knees 07/18/2015  . GERD (gastroesophageal reflux disease) 05/08/2015  . Renal cyst 05/08/2015  . Adenomatous polyp of colon 03/26/2015  . Essential hypertension 02/12/2015  .  Cognitive deficit due to old head trauma 02/12/2015  . Asthma, chronic 02/12/2015  . Chronic left shoulder pain 02/11/2015  . Thyroid nodule 12/18/2014  . Chronic neck pain   . HLD (hyperlipidemia)   . OSA treated with BiPAP 11/27/2014  . Obesity hypoventilation syndrome ([place]) 11/27/2014  . Demand ischemia ([place])   . Diabetes type 2, uncontrolled ([place])   . Cardiomegaly 11/25/2014  . CHF (congestive heart failure) ([place]) 11/25/2014  . 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 ABDOMINAL HYSTERECTOMY    . CATARACT EXTRACTION Left   . CATARACT EXTRACTION     LT 01/2017, 02/2017 on RT  . CESAREAN SECTION       OB History   No obstetric history on file.     Family History  Problem Relation Age of Onset  . Colon cancer Father   . Diabetes type II Sister   . Diabetes type II Brother   . Colon cancer Sister   . Migraines Neg Hx   . Breast cancer Neg Hx     Social History   Tobacco Use  . Smoking status: Never Smoker  . Smokeless tobacco: Never Used  Substance Use Topics  . Alcohol use: No  . Drug use: No    Home Medications Prior to Admission medications   Medication Sig Start Date End Date Taking? Authorizing Provider  ACCU-CHEK FASTCLIX LANCETS MISC Use as directed to  test blood sugar three times daily DX E11.65 06/27/18   Ladell Pier, MD  ACCU-CHEK GUIDE test strip USE AS DIRECTED TO TEST THREE TIMES DAILY 08/10/19   Ladell Pier, MD  acetic acid 2 % otic solution Place 4 drops into both ears every 6 (six) hours as needed. 12/22/18   McDonald, Mia A, PA-C  albuterol (PROVENTIL) (2.5 MG/3ML) 0.083% nebulizer solution USE 1 VIAL VIA NEBULIZER EVERY 6 HOURS AS NEEDED FOR WHEEZING OR SHORTNESS OF BREATH 02/21/18   Ladell Pier, MD  albuterol (VENTOLIN HFA) 108 (90 Base) MCG/ACT inhaler Inhale 1-2 puffs into the lungs every 6 (six) hours as needed for wheezing or shortness of breath. 06/09/19    Ladell Pier, MD  Ascorbic Acid (VITAMIN C PO) Take 1 tablet by mouth daily.    [provider]  aspirin 81 MG chewable tablet Chew 81 mg by mouth daily.     [provider]  atenolol (TENORMIN) 25 MG tablet Take 1 tablet (25 mg total) by mouth daily. Must keep upcoming office visit for refills. 06/09/19   Ladell Pier, MD  atorvastatin (LIPITOR) 40 MG tablet TAKE 1 TABLET BY MOUTH DAILY AT 6 PM 06/09/19   Ladell Pier, MD  Blood Glucose Monitoring Suppl (ACCU-CHEK GUIDE) w/Device KIT 1 each by Does not apply route 3 (three) times daily. Use as directed to test blood sugar three times daily DX E11.65 06/27/18   Ladell Pier, MD  CALCIUM 500/D 500-200 MG-UNIT tablet TAKE 1 TABLET BY MOUTH DAILY Patient not taking: Reported on 07/26/2019 06/20/18   Ladell Pier, MD  cetirizine (ZYRTEC) 10 MG tablet Take 1 tablet (10 mg total) by mouth daily. Patient not taking: Reported on 07/26/2019 12/21/17   Augusto Gamble B, NP  famotidine (PEPCID) 20 MG tablet TAKE 1 TABLET(20 MG) BY MOUTH AT BEDTIME 06/09/19   Ladell Pier, MD  guaiFENesin (MUCINEX PO) Take by mouth.    [provider]  hydrochlorothiazide (HYDRODIURIL) 25 MG tablet TAKE 1 TABLET(25 MG) BY MOUTH DAILY 04/28/19   Ladell Pier, MD  insulin aspart protamine- aspart (NOVOLOG MIX 70/30) (70-30) 100 UNIT/ML injection INJECT 80 UNITS UNDER THE SKIN IN THE MORNING AND 50 UNITS IN THE EVENING WITH MEALS 06/15/19   Ladell Pier, MD  INSULIN SYRINGE 1CC/29G 29G X 1/2" 1 ML MISC USE THREE TIMES DAILY AS DIRECTED 07/18/19   Ladell Pier, MD  losartan (COZAAR) 50 MG tablet Take 1 tablet (50 mg total) by mouth daily. 06/09/19   Ladell Pier, MD  metFORMIN (GLUCOPHAGE) 500 MG tablet Take 0.5 tablets (250 mg total) by mouth daily with breakfast. 06/17/19   Ladell Pier, MD  mometasone-formoterol (DULERA) 100-5 MCG/ACT AERO Inhale 2 puffs into the lungs 2 (two) times daily.  10/13/18   Ladell Pier, MD  montelukast (SINGULAIR) 10 MG tablet TAKE 1 TABLET(10 MG) BY MOUTH DAILY 12/16/18   Ladell Pier, MD  Multiple Vitamin (MULTIVITAMIN WITH MINERALS) TABS tablet Take 1 tablet by mouth daily.    [provider]  nitroGLYCERIN (NITROSTAT) 0.3 MG SL tablet Place 1 tablet (0.3 mg total) under the tongue every 5 (five) minutes as needed for chest pain. Max 3 tablets in 15 minutes 08/07/16   Funches, Adriana Mccallum, MD  pantoprazole (PROTONIX) 40 MG tablet Take 1 tablet (40 mg total) by mouth daily. Must have office visit for refills 06/09/19   Ladell Pier, MD  Potassium Chloride ER 20  MEQ TBCR Take 1 tablet by mouth daily. 06/15/19   Ladell Pier, MD  rizatriptan (MAXALT) 10 MG tablet Take 1 tablet (10 mg total) by mouth as needed for migraine. May repeat in 2 hours if needed. MAX 2 TABLETS PER 24 HOURS. Patient not taking: Reported on 07/26/2019 10/20/18   Melvenia Beam, MD  topiramate (TOPAMAX) 25 MG capsule Take 3 capsules (75 mg total) by mouth 2 (two) times daily. 11/03/18   Melvenia Beam, MD  traMADol (ULTRAM) 50 MG tablet Take 1 tablet (50 mg total) by mouth every 8 (eight) hours as needed. 06/09/19   Ladell Pier, MD    Allergies    Januvia [sitagliptin]  Review of Systems   Review of Systems  Constitutional: Negative for fever.  Respiratory: Positive for cough (chronic), chest tightness and shortness of breath.   Cardiovascular: Positive for chest pain and leg swelling.  Gastrointestinal: Negative for vomiting.  Musculoskeletal: Negative for back pain.  All other systems reviewed and are negative.   Physical Exam Updated Vital Signs BP 115/65   Pulse 86   Temp 98.4 F (36.9 C)   Resp 19   Ht 5' 3"  (1.6 m)   SpO2 91%   BMI 42.09 kg/m   Physical Exam Vitals and nursing note reviewed.  Constitutional:      General: She is not in acute distress.    Appearance: She is well-developed. She is obese. She is not  ill-appearing or diaphoretic.  HENT:     Head: Normocephalic and atraumatic.     Right Ear: External ear normal.     Left Ear: External ear normal.     Nose: Nose normal.  Eyes:     General:        Right eye: No discharge.        Left eye: No discharge.  Cardiovascular:     Rate and Rhythm: Normal rate and regular rhythm.     Heart sounds: Normal heart sounds.  Pulmonary:     Effort: Pulmonary effort is normal.     Breath sounds: Normal breath sounds.  Chest:     Chest wall: No tenderness.  Abdominal:     Palpations: Abdomen is soft.     Tenderness: There is no abdominal tenderness.  Musculoskeletal:     Right lower leg: Edema present.     Left lower leg: Edema present.     Comments: Mild edema at ankles  Skin:    General: Skin is warm and dry.  Neurological:     Mental Status: She is alert.  Psychiatric:        Mood and Affect: Mood is not anxious.     ED Results / Procedures / Treatments   Labs (all labs ordered are listed, but only abnormal results are displayed) Labs Reviewed  BASIC METABOLIC PANEL - Abnormal; Notable for the following components:      Result Value   Glucose, Bld 170 (*)    Creatinine, Ser 1.12 (*)    GFR calc non Af Amer 51 (*)    GFR calc Af Amer 59 (*)    All other components within normal limits  CBC WITH DIFFERENTIAL/PLATELET - Abnormal; Notable for the following components:   RBC 5.37 (*)    HCT 46.9 (*)    All other components within normal limits  SARS CORONAVIRUS 2 (TAT 6-24 HRS)  BRAIN NATRIURETIC PEPTIDE  D-DIMER, QUANTITATIVE (NOT AT [facility])  POC SARS CORONAVIRUS 2 AG -  ED  TROPONIN I (HIGH SENSITIVITY)  TROPONIN I (HIGH SENSITIVITY)    EKG EKG Interpretation  Date/Time:  Thursday August 24 2019 19:21:50 EST Ventricular Rate:  87 PR Interval:    QRS Duration: 79 QT Interval:  350 QTC Calculation: 421 R Axis:   -55 Text Interpretation: Sinus rhythm Left anterior fascicular block Anteroseptal infarct, age indeterminate  no significant change since 2019 Confirmed by Sherwood Gambler [phone]) on 08/24/2019 7:31:01 PM   Radiology DG Chest Portable 1 View  Result Date: 08/24/2019 CLINICAL DATA:  Chest tightness. EXAM: PORTABLE CHEST 1 VIEW COMPARISON:  February 07, 2019 FINDINGS: Cardiomegaly. The hila and mediastinum are normal. No focal infiltrate. No nodule or mass. No overt edema. Haziness in the lateral left base is probably overlapping soft tissues. IMPRESSION: No active disease. Electronically Signed   By: Dorise Bullion III M.D   On: 08/24/2019 19:47    Procedures Procedures (including critical care time)  Medications Ordered in ED Medications  nitroGLYCERIN (NITROSTAT) SL tablet 0.4 mg (has no administration in time range)  sodium chloride 0.9 % bolus 500 mL (has no administration in time range)  aspirin chewable tablet 324 mg (324 mg Oral Given 08/24/19 2020)    ED Course  I have reviewed the triage vital signs and the nursing notes.  Pertinent labs & imaging results that were available during my care of the patient were reviewed by me and considered in my medical decision making (see chart for details).    MDM Rules/Calculators/A&P HEAR Score: 6                    ECG does not appear particularly concerning.  Initial work-up is negative.  However patient does have a heart score of 6 and she is uncomfortable going home.  I think is reasonable then with the higher risk situation to admit for overnight obvious and ACS work-up.  Internal medicine teaching service to admit.  Raphaela Cannaday was evaluated in Emergency Department on 08/24/2019 for the symptoms described in the history of present illness. She was evaluated in the context of the global COVID-19 pandemic, which necessitated consideration that the patient might be at risk for infection with the SARS-CoV-2 virus that causes COVID-19. Institutional protocols and algorithms that pertain to the evaluation of patients at risk for COVID-19 are in a state  of rapid change based on information released by regulatory bodies including the CDC and federal and state organizations. These policies and algorithms were followed during the patient's care in the ED.  Final Clinical Impression(s) / ED Diagnoses Final diagnoses:  Chest pain at rest    Rx / DC Orders ED Discharge Orders    None       Sherwood Gambler, MD 08/24/19 2107

## 2019-08-24 NOTE — ED Triage Notes (Signed)
Pt BIB [facility] from home complaining of chest pain that started this morning and has continued on all day. Pt reports that it has worsened throughout the day. Did report some exertional shortness of breath with the pain as well. Pt arrives alert and oriented x4.   EMS reports 92% on room air, placed patient on 2L via [place] and SpO2 came up to 98% on 2L.  BP 136/72 HR 88 RR 24

## 2019-08-24 NOTE — H&P (Signed)
Date: 08/24/2019               Patient Name:  Heidi [name] MRN: [id number]  DOB: 1951/01/09 Age / Sex: 69 y.o., female   PCP: Ladell Pier, MD         Medical Service: Internal Medicine Teaching Service         Attending Physician: Dr. Sherwood Gambler, MD    First Contact: Court Joy, MD, Dellis Filbert Pager: [place] [phone])  Second Contact: Sherry Ruffing, MD, [place] Pager: Ubaldo Glassing [phone])       After Hours (After 5p/  First Contact Pager: [phone]  weekends / holidays): Second Contact Pager: [phone]   Chief Complaint: Chest pain  History of Present Illness: Heidi [name] is a 69 year old female with a past medical history significant for anemia, anxiety, migraine headaches, asthma, HFpEF (Echo 2016), T2DM, HLD, HTN, OSA, GERD who presents with chest pain.  The patient states she first experienced chest pain yesterday while in the store with her son.  She subsequently woke up this morning with continued chest tightness, chest pain and weakness in her legs.  She tried drinking a soda to see if burping would help, which it did not.  She also tried taking Senokot to have a bowel movement which also did not help.  She describes the sensation as a pressure, as if a log is sitting on her chest. The pain she describes as dull, and radiates to her left arm.  Pain worsens with exertion, and is better with resting.  She did not try taking nitroglycerin.  Albuterol did not help.  The patient does admit to increased ankle swelling over the past couple weeks.  She also endorses increased orthopnea over this time period as well.  She currently endorses a mild headache mild body cramps and chills but denies any changes in her vision, nausea, vomiting, diarrhea, or changes in her bladder habits.  Of note, the patient started trying a new diet, which consists of different liquids including V8 drinks, cranberry juice, water different fruits and vegetables.  She states that she has lost about 20 pounds.  She  currently weighs about 240 pounds.   In the ED, CBC was unremarkable. BMP was notable for a slightly elevated creatinine to 1.12 (baseline).  BNP was within normal limits. D-dimer negative. Troponin negative. EKG did not show any acute ischemic process, and is similar appearing to last EKG in 2019. Chest x-ray unremarkable. In the ED, the patient received 324 mg aspirin. She also received a 500cc bolus of normal saline.  The patient was initially saturating in 92% on room air when EMS picked her up.  She was put on 2 L nasal cannula and oxygen saturation increased to 98%.  BP 136/72, HR 88, RR 24.  Meds:  No outpatient medications have been marked as taking for the 08/24/19 encounter [facility] Encounter).   Allergies: Allergies as of 08/24/2019 - Review Complete 08/24/2019  Allergen Reaction Noted  . Januvia [sitagliptin]  06/09/2019   Past Medical History:  Diagnosis Date  . Adenomatous colon polyp   . Anemia   . Anxiety   . Asthma   . CHF (congestive heart failure) ([place])   . Diabetes mellitus without complication ([place])   . High cholesterol   . Hypertension   . Left knee injury   . Pneumonia    Past Surgical History:  Procedure Laterality Date  . ABDOMINAL HYSTERECTOMY    . CATARACT EXTRACTION Left   . CATARACT EXTRACTION  LT 01/2017, 02/2017 on RT  . CESAREAN SECTION     Family History:  Family History  Problem Relation Age of Onset  . Colon cancer Father   . Diabetes type II Sister   . Diabetes type II Brother   . Colon cancer Sister   . Migraines Neg Hx   . Breast cancer Neg Hx   -Sister MI at age 48  Social History:  Social History   Tobacco Use  . Smoking status: Never Smoker  . Smokeless tobacco: Never Used  Substance Use Topics  . Alcohol use: No  . Drug use: No   -Lives at home with daughter, no sick contacts - Denies alcohol, tobacco or recreational drug use  Review of Systems: A complete ROS was negative except as per HPI.   Imaging: EKG:  personally reviewed my interpretation is normal sinus rhythm.  No signs of acute ischemia.  No significant changes from prior EKG in 2019.  CXR: personally reviewed my interpretation is mild pulmonary interstitial markings, left greater than right may suggest presence of edema.  Physical Exam: Blood pressure 136/73, pulse 85, temperature 98.4 F (36.9 C), resp. rate (!) 36, height 5\' 3"  (1.6 m), SpO2 91 %. Blood pressure symmetrical on both sides  Physical Exam Vitals reviewed.  Constitutional:      General: She is not in acute distress.    Appearance: She is obese. She is diaphoretic. She is not toxic-appearing.  HENT:     Head: Normocephalic and atraumatic.  Eyes:     Extraocular Movements: Extraocular movements intact.  Cardiovascular:     Rate and Rhythm: Normal rate and regular rhythm.     Heart sounds: Normal heart sounds. No murmur. No diastolic murmur. No friction rub. No gallop. No S3 or S4 sounds.   Pulmonary:     Effort: Pulmonary effort is normal. No tachypnea or respiratory distress.     Breath sounds: Examination of the right-upper field reveals decreased breath sounds. Examination of the left-upper field reveals decreased breath sounds. Examination of the right-middle field reveals decreased breath sounds. Examination of the left-middle field reveals decreased breath sounds. Examination of the right-lower field reveals decreased breath sounds. Examination of the left-lower field reveals decreased breath sounds. Decreased breath sounds present. No wheezing, rhonchi or rales.     Comments: Saturations in the low 90s on room air Abdominal:     General: Bowel sounds are normal.     Palpations: Abdomen is soft.     Tenderness: There is no abdominal tenderness. There is no guarding or rebound.  Musculoskeletal:     Right lower leg: No tenderness. Edema (2+ pitting) present.     Left lower leg: No tenderness. Edema (2+ pitting) present.  Skin:    General: Skin is warm.   Neurological:     General: No focal deficit present.     Mental Status: She is alert and oriented to person, place, and time.  Psychiatric:        Mood and Affect: Mood is anxious.    Assessment & Plan by Problem: Active Problems:   Chest pain  In summary, Ms. Gillen is a 69 year old female with a past medical history significant for anemia, anxiety, migraine headaches, asthma, HFpEF (Echo 2016), T2DM, HLD, HTN, OSA, GERD who presents with chest pain that radiates to her left arm and SOB.  Her initial work-up has been unremarkable for ACS, but differential also includes asthma exacerbation, HFpEF exacerbation, and GERD.  #Chest pain: Patient's description  of a long sitting on her chest, with radiation to her left arm was concerning for ACS initially.  EKG was reassuring and troponins have been negative. Patient received aspirin 324 mg in the ED. CXR unremarkable for pulmonary process. -F/u TSH -Aspirin 81 mg daily -Nitroglycerin 0.4 mg every 5 minutes as needed -Telemetry -EKG tomorrow morning  #Hx HFpEF: Last echo performed was in May 2016. Pt states the last time she weighed herself she weighed 240 pounds, which is 20 pounds lighter since starting her new diet.  Patient has been experiencing orthopnea, and increased lower extremity swelling over the last couple weeks which raises concern for heart failure exacerbation. -Echocardiogram ordered -IV Lasix 40 mg once, reassess volume status in the a.m. -Daily weights -Strict I's and O's  #Asthma: Patient's diminished breath sounds on exam may be secondary to body habitus, or reactive airway disease from underlying asthma.  It is unclear at this time.  Patient takes cetirizine 10 mg daily and montelukast 10 mg daily. -Hold home cetirizine and montelukast -Albuterol 1 to 2 puffs every 4 hours as needed -Supplemental O2 as needed  #GERD: Given the patient's recent dietary changes history of GERD, its possible symptoms she is feeling  could be attributed to that.  However, the nature of her chest pain and radiation down the arm makes this less likely. Patient takes famotidine 20 mg at home. -Hold home famotidine -Start Protonix 40 mg daily  #HTN #HLD -Atenolol 25 mg daily -Losartan 50 mg daily -Lipitor 40 mg daily  #T2DM: Patient takes 70/30 80 units in the morning and 50 units in the evening with meals.  He also is prescribed Metformin to 50 mg with breakfast, but states she does not take it. -Lantus 40 U daily -SSI  #Hx of migraines -Sumatriptan 50 mg every 2 hours as needed for 2 doses  #FEN/GI -Diet: Heart healthy/carb modified -Fluids: None -Senokot tablet nightly as needed  #DVT prophylaxis -Lovenox 40 mg subq injections daily  #CODE STATUS: FULL  #Dispo: Admit patient to Observation with expected length of stay less than 2 midnights.  Signed: Earlene Plater, MD Internal Medicine, PGY1 Pager: [phone]  08/24/2019,10:21 PM

## 2019-08-25 ENCOUNTER — Observation Stay (HOSPITAL_BASED_OUTPATIENT_CLINIC_OR_DEPARTMENT_OTHER): Payer: Medicare (Managed Care)

## 2019-08-25 DIAGNOSIS — E669 Obesity, unspecified: Secondary | ICD-10-CM | POA: Diagnosis present

## 2019-08-25 DIAGNOSIS — R531 Weakness: Secondary | ICD-10-CM | POA: Diagnosis not present

## 2019-08-25 DIAGNOSIS — Z20822 Contact with and (suspected) exposure to covid-19: Secondary | ICD-10-CM | POA: Diagnosis present

## 2019-08-25 DIAGNOSIS — J45909 Unspecified asthma, uncomplicated: Secondary | ICD-10-CM | POA: Diagnosis present

## 2019-08-25 DIAGNOSIS — E11319 Type 2 diabetes mellitus with unspecified diabetic retinopathy without macular edema: Secondary | ICD-10-CM | POA: Diagnosis present

## 2019-08-25 DIAGNOSIS — R079 Chest pain, unspecified: Secondary | ICD-10-CM | POA: Diagnosis present

## 2019-08-25 DIAGNOSIS — R0902 Hypoxemia: Secondary | ICD-10-CM | POA: Diagnosis present

## 2019-08-25 DIAGNOSIS — E7800 Pure hypercholesterolemia, unspecified: Secondary | ICD-10-CM | POA: Diagnosis present

## 2019-08-25 DIAGNOSIS — Z7982 Long term (current) use of aspirin: Secondary | ICD-10-CM | POA: Diagnosis not present

## 2019-08-25 DIAGNOSIS — H1013 Acute atopic conjunctivitis, bilateral: Secondary | ICD-10-CM | POA: Diagnosis not present

## 2019-08-25 DIAGNOSIS — Z800 Family history of malignant neoplasm of digestive organs: Secondary | ICD-10-CM | POA: Diagnosis not present

## 2019-08-25 DIAGNOSIS — G4733 Obstructive sleep apnea (adult) (pediatric): Secondary | ICD-10-CM | POA: Diagnosis present

## 2019-08-25 DIAGNOSIS — Z794 Long term (current) use of insulin: Secondary | ICD-10-CM | POA: Diagnosis not present

## 2019-08-25 DIAGNOSIS — I959 Hypotension, unspecified: Secondary | ICD-10-CM | POA: Diagnosis not present

## 2019-08-25 DIAGNOSIS — Z833 Family history of diabetes mellitus: Secondary | ICD-10-CM | POA: Diagnosis not present

## 2019-08-25 DIAGNOSIS — Z888 Allergy status to other drugs, medicaments and biological substances status: Secondary | ICD-10-CM | POA: Diagnosis not present

## 2019-08-25 DIAGNOSIS — Z6841 Body Mass Index (BMI) 40.0 and over, adult: Secondary | ICD-10-CM | POA: Diagnosis not present

## 2019-08-25 DIAGNOSIS — K219 Gastro-esophageal reflux disease without esophagitis: Secondary | ICD-10-CM | POA: Diagnosis present

## 2019-08-25 DIAGNOSIS — E785 Hyperlipidemia, unspecified: Secondary | ICD-10-CM | POA: Diagnosis present

## 2019-08-25 DIAGNOSIS — R0602 Shortness of breath: Secondary | ICD-10-CM | POA: Diagnosis not present

## 2019-08-25 DIAGNOSIS — H1010 Acute atopic conjunctivitis, unspecified eye: Secondary | ICD-10-CM | POA: Diagnosis present

## 2019-08-25 DIAGNOSIS — G43909 Migraine, unspecified, not intractable, without status migrainosus: Secondary | ICD-10-CM | POA: Diagnosis present

## 2019-08-25 DIAGNOSIS — Z7951 Long term (current) use of inhaled steroids: Secondary | ICD-10-CM | POA: Diagnosis not present

## 2019-08-25 DIAGNOSIS — E119 Type 2 diabetes mellitus without complications: Secondary | ICD-10-CM | POA: Diagnosis present

## 2019-08-25 DIAGNOSIS — I5032 Chronic diastolic (congestive) heart failure: Secondary | ICD-10-CM | POA: Diagnosis present

## 2019-08-25 DIAGNOSIS — I110 Hypertensive heart disease with heart failure: Secondary | ICD-10-CM | POA: Diagnosis present

## 2019-08-25 LAB — COMPREHENSIVE METABOLIC PANEL
ALT: 25 U/L (ref 0–44)
AST: 23 U/L (ref 15–41)
Albumin: 3.7 g/dL (ref 3.5–5.0)
Alkaline Phosphatase: 101 U/L (ref 38–126)
Anion gap: 12 (ref 5–15)
BUN: 8 mg/dL (ref 8–23)
CO2: 31 mmol/L (ref 22–32)
Calcium: 9 mg/dL (ref 8.9–10.3)
Chloride: 97 mmol/L — ABNORMAL LOW (ref 98–111)
Creatinine, Ser: 1.07 mg/dL — ABNORMAL HIGH (ref 0.44–1.00)
GFR calc Af Amer: >60 mL/min (ref >60)
GFR calc non Af Amer: 54 mL/min — ABNORMAL LOW (ref >60)
Glucose, Bld: 166 mg/dL — ABNORMAL HIGH (ref 70–99)
Potassium: 3.1 mmol/L — ABNORMAL LOW (ref 3.5–5.1)
Sodium: 140 mmol/L (ref 135–145)
Total Bilirubin: 0.8 mg/dL (ref 0.3–1.2)
Total Protein: 6.5 g/dL (ref 6.5–8.1)

## 2019-08-25 LAB — MAGNESIUM: Magnesium: 1.6 mg/dL — ABNORMAL LOW (ref 1.7–2.4)

## 2019-08-25 LAB — CBC WITH DIFFERENTIAL/PLATELET
Abs Immature Granulocytes: 0.02 10*3/uL (ref 0.00–0.07)
Basophils Absolute: 0.1 10*3/uL (ref 0.0–0.1)
Basophils Relative: 1 %
Eosinophils Absolute: 0.3 10*3/uL (ref 0.0–0.5)
Eosinophils Relative: 5 %
HCT: 46.3 % — ABNORMAL HIGH (ref 36.0–46.0)
Hemoglobin: 14 g/dL (ref 12.0–15.0)
Immature Granulocytes: 0 %
Lymphocytes Relative: 39 %
Lymphs Abs: 2.8 10*3/uL (ref 0.7–4.0)
MCH: 26.8 pg (ref 26.0–34.0)
MCHC: 30.2 g/dL (ref 30.0–36.0)
MCV: 88.5 fL (ref 80.0–100.0)
Monocytes Absolute: 0.5 10*3/uL (ref 0.1–1.0)
Monocytes Relative: 8 %
Neutro Abs: 3.4 10*3/uL (ref 1.7–7.7)
Neutrophils Relative %: 47 %
Platelets: 185 10*3/uL (ref 150–400)
RBC: 5.23 MIL/uL — ABNORMAL HIGH (ref 3.87–5.11)
RDW: 15 % (ref 11.5–15.5)
WBC: 7.1 10*3/uL (ref 4.0–10.5)
nRBC: 0 % (ref 0.0–0.2)

## 2019-08-25 LAB — GLUCOSE, CAPILLARY
Glucose-Capillary: 216 mg/dL — ABNORMAL HIGH (ref 70–99)
Glucose-Capillary: 218 mg/dL — ABNORMAL HIGH (ref 70–99)
Glucose-Capillary: 337 mg/dL — ABNORMAL HIGH (ref 70–99)
Glucose-Capillary: 358 mg/dL — ABNORMAL HIGH (ref 70–99)

## 2019-08-25 LAB — URINALYSIS, ROUTINE W REFLEX MICROSCOPIC
Bilirubin Urine: NEGATIVE
Glucose, UA: NEGATIVE mg/dL
Hgb urine dipstick: NEGATIVE
Ketones, ur: NEGATIVE mg/dL
Leukocytes,Ua: NEGATIVE
Nitrite: NEGATIVE
Protein, ur: NEGATIVE mg/dL
Specific Gravity, Urine: 1.011 (ref 1.005–1.030)
pH: 6 (ref 5.0–8.0)

## 2019-08-25 LAB — HIV ANTIBODY (ROUTINE TESTING W REFLEX): HIV Screen 4th Generation wRfx: NONREACTIVE

## 2019-08-25 LAB — SARS CORONAVIRUS 2 (TAT 6-24 HRS): SARS Coronavirus 2: NEGATIVE

## 2019-08-25 LAB — ECHOCARDIOGRAM COMPLETE
Height: 63 in
Weight: 3740.8 oz

## 2019-08-25 LAB — CBG MONITORING, ED: Glucose-Capillary: 130 mg/dL — ABNORMAL HIGH (ref 70–99)

## 2019-08-25 LAB — TSH: TSH: 1.977 u[IU]/mL (ref 0.350–4.500)

## 2019-08-25 MED ORDER — OLOPATADINE HCL 0.1 % OP SOLN
1.0000 [drp] | Freq: Two times a day (BID) | OPHTHALMIC | Status: DC
  Administered 2019-08-25 – 2019-08-26 (×3): 1 [drp] via OPHTHALMIC
  Filled 2019-08-25: qty 5

## 2019-08-25 MED ORDER — MONTELUKAST SODIUM 10 MG PO TABS
10.0000 mg | ORAL_TABLET | Freq: Every day | ORAL | Status: DC
  Administered 2019-08-25: 10 mg via ORAL
  Filled 2019-08-25: qty 1

## 2019-08-25 MED ORDER — MAGNESIUM SULFATE 2 GM/50ML IV SOLN
2.0000 g | Freq: Once | INTRAVENOUS | Status: AC
  Administered 2019-08-25: 2 g via INTRAVENOUS
  Filled 2019-08-25: qty 50

## 2019-08-25 MED ORDER — LORATADINE 10 MG PO TABS: 10.0000 mg | ORAL_TABLET | Freq: Every day | ORAL | Status: DC

## 2019-08-25 MED ORDER — POTASSIUM CHLORIDE CRYS ER 20 MEQ PO TBCR
40.0000 meq | EXTENDED_RELEASE_TABLET | Freq: Two times a day (BID) | ORAL | Status: AC
  Administered 2019-08-25 (×2): 40 meq via ORAL
  Filled 2019-08-25 (×2): qty 2

## 2019-08-25 MED ORDER — FUROSEMIDE 10 MG/ML IJ SOLN
40.0000 mg | Freq: Once | INTRAMUSCULAR | Status: AC
  Administered 2019-08-25: 40 mg via INTRAVENOUS
  Filled 2019-08-25: qty 4

## 2019-08-25 NOTE — Progress Notes (Signed)
Echocardiogram 2D Echocardiogram has been performed.  Oneal Deputy Alontae Chaloux 08/25/2019, 9:26 AM   Dr. Audie Box notified of stat.

## 2019-08-25 NOTE — Progress Notes (Signed)
SATURATION QUALIFICATIONS: (This note is used to comply with regulatory documentation for home oxygen)  Patient Saturations on Room Air at Rest = 86%  Patient Saturations on Room Air while Ambulating = 87%  Patient Saturations on 2 Liters of oxygen while Ambulating = 94%  Please briefly explain why patient needs home oxygen: Pt needs supplemental 02 to maintain Sp02 >90%.   Zettie Cooley, DPT Acute Rehabilitation Services Pager [phone] Office [phone]

## 2019-08-25 NOTE — Progress Notes (Signed)
Noted pt using alcohol spray to clean her eyes.  Educated her not to use alcohol to her face.  She stated that she had been using it to her eyes, and not causing any problem.  Pt has red conjunctiva.   Idolina Primer, RN

## 2019-08-25 NOTE — Evaluation (Signed)
Physical Therapy Evaluation Patient Details Name: Heidi [name] MRN: [id number] DOB: 02-11-1951 Today's Date: 08/25/2019   History of Present Illness  Patient is a 69 y/o female who presents with chest pain. Her initial work-up has been unremarkable for ACS, but differential also includes asthma exacerbation, HFpEF exacerbation, and GERD. PMH includes HTN, DM, CHF, anxiety, migraine headaches, asthma, HFpEF (Echo 2016), OSA.  Clinical Impression  Patient presents with generalized weakness, deconditioning, dyspnea on exertion, decreased activity tolerance and impaired mobility s/p above. Pt lives with daughter and reports being Mod I with rollator as needed PTA. Reports being a short household ambulator. Today, pt tolerated bed mobility, transfers and gait training with min guard-supervision for safety and use of RW. Sp02 dropped to 87% on RA at rest and with mobility; donned 2L/min 02 [place] and able to maintain Sp02 >91%. Pt will likely need supplemental 02 at d/c. Will follow acutely to maximize independence and mobility prior to return home.    Follow Up Recommendations No PT follow up;Supervision - Intermittent    Equipment Recommendations  None recommended by PT    Recommendations for Other Services       Precautions / Restrictions Precautions Precautions: Other (comment);Fall Precaution Comments: watch 02 Restrictions Weight Bearing Restrictions: No      Mobility  Bed Mobility Overal bed mobility: Needs Assistance Bed Mobility: Supine to Sit     Supine to sit: Modified independent (Device/Increase time);HOB elevated     General bed mobility comments: No assist needed.  Transfers Overall transfer level: Needs assistance Equipment used: Rolling walker (2 wheeled) Transfers: Sit to/from Stand Sit to Stand: Min guard         General transfer comment: Min guard for safety. Stood from [employer], from toilet x1.  Ambulation/Gait Ambulation/Gait assistance: Min guard Gait  Distance (Feet): 80 Feet Assistive device: Rolling walker (2 wheeled) Gait Pattern/deviations: Step-through pattern;Decreased stride length Gait velocity: decreased   General Gait Details: Slow, mildly unsteady gait with use of RW for support; 2/4 DOE. Sp02 dropped to 87% on RA, donned 2L/min 02 [place] and able to maintain Sp02 >91%.  Stairs            Wheelchair Mobility    Modified Rankin (Stroke Patients Only)       Balance Overall balance assessment: Needs assistance Sitting-balance support: Feet supported;No upper extremity supported Sitting balance-Leahy Scale: Good Sitting balance - Comments: supervision for safety   Standing balance support: During functional activity Standing balance-Leahy Scale: Fair Standing balance comment: Able to stand statically without UE support; does better with UE for gait.                             Pertinent Vitals/Pain Pain Assessment: No/denies pain    Home Living Family/patient expects to be discharged to:: Private residence Living Arrangements: Children(daughter) Available Help at Discharge: Family;Available PRN/intermittently Type of Home: Apartment Home Access: Stairs to enter   Entrance Stairs-Number of Steps: 1 flight Home Layout: One level Home Equipment: [place] - 4 wheels;Cane - single point      Prior Function Level of Independence: Independent with assistive device(s)         Comments: Uses rollator as needed PTA. Daughter is disabled and cannot assist. Does not do much cooking. drives. Rides buggy at grocery store. Household ambulator.     Hand Dominance        Extremity/Trunk Assessment   Upper Extremity Assessment Upper Extremity Assessment: Defer to  OT evaluation    Lower Extremity Assessment Lower Extremity Assessment: Generalized weakness(but functional)       Communication   Communication: No difficulties  Cognition Arousal/Alertness: Awake/alert Behavior During Therapy: WFL for  tasks assessed/performed Overall Cognitive Status: Within Functional Limits for tasks assessed                                        General Comments General comments (skin integrity, edema, etc.): Sp02 87% on RA, stayed >91% on 2L/min 02 [place].    Exercises     Assessment/Plan    PT Assessment Patient needs continued PT services  PT Problem List Decreased strength;Decreased mobility;Decreased balance;Decreased activity tolerance;Cardiopulmonary status limiting activity       PT Treatment Interventions Therapeutic activities;Gait training;Therapeutic exercise;Patient/family education;Balance training;Stair training;Functional mobility training    PT Goals (Current goals can be found in the Care Plan section)  Acute Rehab PT Goals Patient Stated Goal: to get stronger and go home PT Goal Formulation: With patient Time For Goal Achievement: 09/08/19 Potential to Achieve Goals: Good    Frequency Min 3X/week   Barriers to discharge Inaccessible home environment stairs to enter home    Co-evaluation               AM-PAC PT "6 Clicks" Mobility  Outcome Measure Help needed turning from your back to your side while in a flat bed without using bedrails?: None Help needed moving from lying on your back to sitting on the side of a flat bed without using bedrails?: None Help needed moving to and from a bed to a chair (including a wheelchair)?: None Help needed standing up from a chair using your arms (e.g., wheelchair or bedside chair)?: A Little Help needed to walk in hospital room?: A Little Help needed climbing 3-5 steps with a railing? : A Little 6 Click Score: 21    End of Session Equipment Utilized During Treatment: Oxygen;Gait belt Activity Tolerance: Treatment limited secondary to medical complications (Comment)(drop in Sp02) Patient left: in bed;with call bell/phone within reach;with bed alarm set(sitting EOB eating breakfast) Nurse Communication:  Mobility status PT Visit Diagnosis: Muscle weakness (generalized) (M62.81);Unsteadiness on feet (R26.81);Difficulty in walking, not elsewhere classified (R26.2)    Time: 1013-1040 PT Time Calculation (min) (ACUTE ONLY): 27 min   Charges:   PT Evaluation $PT Eval Moderate Complexity: 1 Mod PT Treatments $Gait Training: 8-22 mins        Marisa Severin, PT, DPT Acute Rehabilitation Services Pager [phone] Office [place] 08/25/2019, 12:07 PM

## 2019-08-25 NOTE — Progress Notes (Signed)
Subjective:   Patient says she experienced chest pain 2 days ago walking with her grandson. Her chest became tight and was relieved with rest. This chest pain went away after whe was home resting. She reports she woke up with chest pain the next moring, felt like something was sitting on her chest. She tried a few things to help her belch and have a bowel movement. Reports the symptoms continued despite these interventions. She contacted her PCP and was advised to get evaluated.   She reports she has had chest pain in the past similar to this. She reports she almost had a heart attack and was admitted for a heart cathertization 10 years ago. Denies having any stents placed , but was put on lipitor. Also some other medications she cant recall. She did not take any nitroglycerin during this episode of chest pain. She reports that she does not have asthma or any clear diagnosis for her lung issue , but has an albuterol inhaler.  Chest pain now is better, still having some "funny feeling" there. Pain was worse with walking and she became very weak in her legs. Weight at home was 240, today weight was down to 233. Reports that her lower extremity swelling has improved.  Discussed the negative ECG and troponin from admission, will have PT and OT work with her today.   Her eyes, especially her left eye has been red for a few days now. She reports rubbing some lotion around her eyes and irritating them.          Objective:  Vital signs in last 24 hours: Vitals:   08/25/19 0330 08/25/19 0345 08/25/19 0536 08/25/19 0540  BP: 120/65 122/67  119/76  Pulse: 87 88  99  Resp: (!) 21 (!) 30    Temp:    98.6 F (37 C)  TempSrc:    Oral  SpO2: 92% 93%  94%  Weight:   106.1 kg   Height:   5\' 3"  (1.6 m)    General: Middle aged female, NAD, laying in bed HEENT: red conjunctiva , pupils equal round and reactive to light, EOMI Cardiac: RRR, no m/r/g Pulmonary: CTAB, no wheezes , rales, or rhonchi EXT: 2+  pitting edema BLE, no deformities    Assessment/Plan:  Active Problems:   Chest pain  69 year old female with a past medical history of anemia, anxiety, GERD , migraines, asthma,type 2 diabetes ,OSA, hyperlipidemia, hypertension, and chronic diastolic heart failure who presented for evaluation of chest pain.   #Chest pain ,# Weakness #Hx HFpEF Patient has been experiencing chest pain for 2 days, describes typical angina.  Chest pain is worse with activity and relieved with rest, radiation down her arm.  EKG and troponins were nl, TTE shows no acute wall motion abnormalities and normal EF of 65 -70% .  Bilateral 2+ edema in lower extremities, however her BNP is 22 and this makes an exacerbation of her diastolic heart failure unlikely.  D-dimer normal making aortic dissection or PE unlikely. Albumin is nl. Patient received 2 doses of Lasix since admission for lower extremity edema.  Her blood pressure dropped this afternoon and she says she continues to feel weak. No leukocytosis or fever. Nothing obvious on her workup so far, will keep her overnight and monitor for improvement.  We will recommend outpatient stress test unless chest pain comes back on admission.  P: -Strict ins and outs - bladder scan - UA   # Asthma  # Allergies Reports a  history of asthma, but does not recall ever having PFTs. P: --Continue Claritin and montelukast  #Allergic conjunctivitis Patient has red conjuctiva and clear drainage from left eye from a lotion she applied.History of allergies. No eye pain, PERRL P: - Olopatadine 1 drop in both eyes twice a day -Monitor for improvement  #GERD Continue Protonix 40 mg daily  #HTN Patient hypotensive this afternoon , holding bp medications  -Atenolol 25 mg daily -Hold losartan 50 mg daily  #HLD -Lipitor 40 mg daily  #T2DM Patient takes 70/3080 units in the morning 50 units in the evening with meals. She was also was prescribed Metformin to 50 mg with  breakfast, not taking. P: -Lantus 40 units daily -SSI-R  Prior to Admission Living Arrangement:home Anticipated Discharge Location:home Barriers to Discharge: Patient hypotensive, needs monitoring overnight Dispo: Anticipated discharge in approximately 1-2 day(s).   Tamsen Snider, MD PGY1

## 2019-08-25 NOTE — Evaluation (Signed)
Occupational Therapy Evaluation Patient Details Name: Heidi [name] MRN: [id number] DOB: [birth year] Today's Date: 08/25/2019    History of Present Illness Patient is a 69 y/o female who presents with chest pain. Her initial work-up has been unremarkable for ACS, but differential also includes asthma exacerbation, HFpEF exacerbation, and GERD. PMH includes HTN, DM, CHF, anxiety, migraine headaches, asthma, HFpEF (Echo 2016), OSA.   Clinical Impression   Patient is a 69 year old female that lives with her disabled daughter in an apartment with flight of stairs to enter. Patient is modified independent at baseline with use of rollator in the house. Currently patient is supervision level for ADLs, requires increased time due to shortness of breath on room air. Attempt to obtain vitals however having difficulty obtaining accurate wave form with finger probe. Observed O2 fluctuating between 87-90% on room air during sink side sponge bathing. Recommend continued acute OT services for energy conservation strategies and to maximize patient safety with self care.    Follow Up Recommendations  No OT follow up    Equipment Recommendations  Tub/shower bench       Precautions / Restrictions Precautions Precautions: Other (comment);Fall Precaution Comments: watch 02 Restrictions Weight Bearing Restrictions: No      Mobility Bed Mobility Overal bed mobility: Modified Independent Bed Mobility: Supine to Sit     Supine to sit: Modified independent (Device/Increase time);HOB elevated     General bed mobility comments: No assist needed.  Transfers Overall transfer level: Needs assistance Equipment used: None Transfers: Sit to/from Stand Sit to Stand: Supervision         General transfer comment: no physical assist required, supervision for safety    Balance Overall balance assessment: Needs assistance Sitting-balance support: Feet supported;No upper extremity supported Sitting  balance-Leahy Scale: Good    Standing balance support: No upper extremity supported;During functional activity Standing balance-Leahy Scale: Fair                            ADL either performed or assessed with clinical judgement   ADL Overall ADL's : Needs assistance/impaired Eating/Feeding: Independent   Grooming: Wash/dry face;Wash/dry hands;Supervision/safety;Standing   Upper Body Bathing: Supervision/ safety;Standing Upper Body Bathing Details (indicate cue type and reason): standing sink side for sponge bathing Lower Body Bathing: Supervison/ safety;Sit to/from stand Lower Body Bathing Details (indicate cue type and reason): standing sink side for sponge bathing, requires increased time due to shortness of breath Upper Body Dressing : Supervision/safety;Sitting   Lower Body Dressing: Supervision/safety;Sit to/from stand Lower Body Dressing Details (indicate cue type and reason): doff underwear to wash peri area Toilet Transfer: Supervision/safety;BSC;Ambulation Toilet Transfer Details (indicate cue type and reason): simulated with functional mobility, no physical assist supervision for safety Toileting- Clothing Manipulation and Hygiene: Supervision/safety;Sit to/from stand       Functional mobility during ADLs: Supervision/safety General ADL Comments: attempt sink side ADLs on room air, having difficulty maintaining accurate wave form from finger probe however fluctuating from 87-90%. Patient reports shortness of breath without supplemental oxygen                  Pertinent Vitals/Pain Pain Assessment: Faces Faces Pain Scale: Hurts a little bit Pain Location: chest Pain Descriptors / Indicators: Sore Pain Intervention(s): Monitored during session     Hand Dominance Right   Extremity/Trunk Assessment Upper Extremity Assessment Upper Extremity Assessment: Overall WFL for tasks assessed   Lower Extremity Assessment Lower Extremity Assessment: Defer  to PT  evaluation       Communication Communication Communication: No difficulties   Cognition Arousal/Alertness: Awake/alert Behavior During Therapy: WFL for tasks assessed/performed Overall Cognitive Status: Within Functional Limits for tasks assessed                                                Home Living Family/patient expects to be discharged to:: Private residence Living Arrangements: Children(reports daughter is disabled) Available Help at Discharge: Family;Available PRN/intermittently Type of Home: Apartment Home Access: Stairs to enter Entrance Stairs-Number of Steps: 1 flight   Home Layout: One level     Bathroom Shower/Tub: Teacher, early years/pre: Standard     Home Equipment: Environmental consultant - 4 wheels;Cane - single point          Prior Functioning/Environment Level of Independence: Independent with assistive device(s)        Comments: Uses rollator as needed PTA. Daughter is disabled and cannot assist. Does not do much cooking. drives. Rides buggy at grocery store. Household ambulator.        OT Problem List: Decreased activity tolerance;Impaired balance (sitting and/or standing);Decreased knowledge of use of DME or AE      OT Treatment/Interventions: Self-care/ADL training;Therapeutic exercise;Energy conservation;DME and/or AE instruction;Therapeutic activities;Patient/family education;Balance training    OT Goals(Current goals can be found in the care plan section) Acute Rehab OT Goals Patient Stated Goal: to get stronger and go home OT Goal Formulation: With patient Time For Goal Achievement: 09/08/19 Potential to Achieve Goals: Good  OT Frequency: Min 2X/week    AM-PAC OT "6 Clicks" Daily Activity     Outcome Measure Help from another person eating meals?: None Help from another person taking care of personal grooming?: A Little Help from another person toileting, which includes using toliet, bedpan, or urinal?: A  Little Help from another person bathing (including washing, rinsing, drying)?: A Little Help from another person to put on and taking off regular upper body clothing?: A Little Help from another person to put on and taking off regular lower body clothing?: A Little 6 Click Score: 19   End of Session Nurse Communication: Mobility status  Activity Tolerance: Patient tolerated treatment well Patient left: in bed;with nursing/sitter in room  OT Visit Diagnosis: Other abnormalities of gait and mobility (R26.89)                Time: 1130-1159 OT Time Calculation (min): 29 min Charges:  OT General Charges $OT Visit: 1 Visit OT Evaluation $OT Eval Moderate Complexity: 1 Mod OT Treatments $Self Care/Home Management : 8-22 mins  [place] OT office: [place] 08/25/2019, 12:36 PM

## 2019-08-25 NOTE — ED Notes (Signed)
Reswabbed for Covid test 6-24 hrs, per main lab specimen was not received.

## 2019-08-25 NOTE — ED Notes (Addendum)
Pt independent from bedside commode to sink. sts "I feel a little wabbaly" - observed pt to be slightly unsteady but did not need assistance with ambulating from sink to stretcher. Pt currently comfortable sitting on side of bed, declined gripper socks sts "my slippers are fine." Bedside table in front of pt. Pt connected to pulse ox and 5-lead monitor.

## 2019-08-25 NOTE — Plan of Care (Signed)
  Problem: Education: Goal: Knowledge of General Education information will improve Description Including pain rating scale, medication(s)/side effects and non-pharmacologic comfort measures Outcome: Progressing   

## 2019-08-26 DIAGNOSIS — G43909 Migraine, unspecified, not intractable, without status migrainosus: Secondary | ICD-10-CM

## 2019-08-26 DIAGNOSIS — Z888 Allergy status to other drugs, medicaments and biological substances status: Secondary | ICD-10-CM

## 2019-08-26 DIAGNOSIS — I110 Hypertensive heart disease with heart failure: Secondary | ICD-10-CM

## 2019-08-26 DIAGNOSIS — R531 Weakness: Secondary | ICD-10-CM

## 2019-08-26 DIAGNOSIS — I5030 Unspecified diastolic (congestive) heart failure: Secondary | ICD-10-CM

## 2019-08-26 DIAGNOSIS — H1013 Acute atopic conjunctivitis, bilateral: Secondary | ICD-10-CM

## 2019-08-26 DIAGNOSIS — K219 Gastro-esophageal reflux disease without esophagitis: Secondary | ICD-10-CM

## 2019-08-26 DIAGNOSIS — F419 Anxiety disorder, unspecified: Secondary | ICD-10-CM

## 2019-08-26 DIAGNOSIS — Z794 Long term (current) use of insulin: Secondary | ICD-10-CM

## 2019-08-26 DIAGNOSIS — E119 Type 2 diabetes mellitus without complications: Secondary | ICD-10-CM

## 2019-08-26 DIAGNOSIS — D649 Anemia, unspecified: Secondary | ICD-10-CM

## 2019-08-26 DIAGNOSIS — J45909 Unspecified asthma, uncomplicated: Secondary | ICD-10-CM

## 2019-08-26 DIAGNOSIS — R0602 Shortness of breath: Secondary | ICD-10-CM

## 2019-08-26 DIAGNOSIS — Z79899 Other long term (current) drug therapy: Secondary | ICD-10-CM

## 2019-08-26 DIAGNOSIS — R079 Chest pain, unspecified: Principal | ICD-10-CM

## 2019-08-26 DIAGNOSIS — E785 Hyperlipidemia, unspecified: Secondary | ICD-10-CM

## 2019-08-26 DIAGNOSIS — G4733 Obstructive sleep apnea (adult) (pediatric): Secondary | ICD-10-CM

## 2019-08-26 LAB — BASIC METABOLIC PANEL
Anion gap: 8 (ref 5–15)
BUN: 16 mg/dL (ref 8–23)
CO2: 32 mmol/L (ref 22–32)
Calcium: 9.1 mg/dL (ref 8.9–10.3)
Chloride: 98 mmol/L (ref 98–111)
Creatinine, Ser: 1.4 mg/dL — ABNORMAL HIGH (ref 0.44–1.00)
GFR calc Af Amer: 45 mL/min — ABNORMAL LOW (ref >60)
GFR calc non Af Amer: 39 mL/min — ABNORMAL LOW (ref >60)
Glucose, Bld: 422 mg/dL — ABNORMAL HIGH (ref 70–99)
Potassium: 5 mmol/L (ref 3.5–5.1)
Sodium: 138 mmol/L (ref 135–145)

## 2019-08-26 LAB — GLUCOSE, CAPILLARY
Glucose-Capillary: 375 mg/dL — ABNORMAL HIGH (ref 70–99)
Glucose-Capillary: 349 mg/dL — ABNORMAL HIGH (ref 70–99)

## 2019-08-26 MED ORDER — ACETAMINOPHEN 325 MG PO TABS
650.0000 mg | ORAL_TABLET | Freq: Four times a day (QID) | ORAL | Status: DC | PRN
  Administered 2019-08-26: 650 mg via ORAL
  Filled 2019-08-26: qty 2

## 2019-08-26 MED ORDER — INSULIN ASPART 100 UNIT/ML ~~LOC~~ SOLN
7.0000 [IU] | Freq: Three times a day (TID) | SUBCUTANEOUS | Status: DC
  Administered 2019-08-26 (×2): 7 [IU] via SUBCUTANEOUS

## 2019-08-26 MED ORDER — OLOPATADINE HCL 0.1 % OP SOLN: 1.0000 [drp] | Freq: Two times a day (BID) | OPHTHALMIC | 0 refills | Status: DC

## 2019-08-26 MED ORDER — PNEUMOCOCCAL VAC POLYVALENT 25 MCG/0.5ML IJ INJ: 0.5000 mL | INJECTION | INTRAMUSCULAR | Status: DC

## 2019-08-26 NOTE — Progress Notes (Signed)
Subjective: Patient notes that she is extremely weak this morning. She notes that her blood pressure has been low however has been a little better this morning. She notes that she tried getting up and walking around but was just feeling very weak. She has been eating well this morning.   She thinks she may have a lung problem as she was previously tested for asthma 2 years ago but physician determined that she did not have it . However, she believes that she has COPD and has been taking symbicort at home that has helped with her breathing. She does note that she has had chronic cough and notes some phlegm production. No history of smoking.  Patient requesting hospital bed on discharge. Discussed that will follow up on physical therapy notes. All questions and concerns addressed.   Went to evaluate later in the day, patient reports that she has been walking around with no issues. She was having some left ear pain, she denied any hearing changes, drainage, or other issues. Evaluated eye canal with otoscope, discussed that it was normal appearing. She reported that she was feeling a little better, would feel comfortable leaving today. Discussed that we will check her pulse ox when she is walking again to see if she will qualify for oxygen.   Objective:  Vital signs in last 24 hours: Vitals:   08/25/19 2036 08/26/19 0009 08/26/19 0455 08/26/19 0541  BP: (!) 90/45 (!) 122/53 (!) 112/46 (!) 110/52  Pulse: 90  (!) 110   Resp: 20  17   Temp: 98.5 F (36.9 C)  98.2 F (36.8 C)   TempSrc: Axillary  Oral   SpO2: 94% 98% 98%   Weight:   106.6 kg   Height:        General: Elderly female, NAD, laying in bed HEENT: Bilateral otoscope exam showed normal tympanic membrane, no obvious bulging, no drainage, minimal cerumen in left ear canal Cardiac: RRR, no m/r/g Pulmonary: Decreased breath sounds, normal work of breathing, no obvious wheezing or rhonchi noted Abdomen: Soft, non-tender, non-distended,  normoactive bowel sounds Extremity: No LE edema  Assessment/Plan:  Active Problems:   Chest pain  This is a 69 year old female with a history of anemia, anxiety, GERD, migraines, asthma, DM, HLD, HTN, HFpEF, and OSA who presented with chest pain.   Hx of asthma: Patient reporting worsening SOB, sputum production, and generalized weakness. Does not appear to have an acute exacerbation at this time. Decreased breath sounds noted on exam. Is requiring supplemental oxygen at rest, will try to wean down on this today, however she had been on oxygen in the past and may need to continue on discharge. Will need repeat PFTs outpatient and potentially Step up therapy for asthma.  -No PFTs on record.  -Continue home Dulera and montelukast, may be able to transition to Symbicort again by PCP -Pulse ox with ambulation, may require home oxygen  Chest pain: -Typical features, worsened with activity, improved with rest, radiates down arm. Heart score of 4, moderate risk. EKG and troponin's unremarkable. Noted to have some hypotension down to 71/45 (one reading) yesterday, remained asymptomatic. Held home BP medications. Today BP stable around 110/52. She has not had any more chest pain since admission, has been having some generalized weakness. Will continue holding home blood pressure medications. Plan for follow up with cardiology and outpatient stress test. Discussed that this can be done outpatient and she was in agreement with this.  -Outpatient stress test  BL  eye redness, clear drainage: Likely 2/2 allergic conjunctivitis, started on olantipine drops yesterday. No pain or vision changes noted. Improvement in conjunctival injection today.  HTN: -On atenolol 25 mg and losartan 50 mg daily at home. BP yesterday afternoon was hypotensive, held home BP medications. Today BP stable around 110/52. Will hold these meds on discharge  Diabetes mellitus: On insulin 70/30 80 units in AM and 50 units in PM with  meals, not taking her metformin daily. Currently on lantus 40 mg daily and SSI-R. CBGs around 340s.  -Resume home medications on discharge  FEN: No fluids, replete lytes prn, HH diet  VTE ppx: Lovenox  Code Status: FULL   Dispo: Anticipated discharge in approximately today pending pulse ox results and potential arrangement of home oxygen.   Asencion Noble, MD 08/26/2019, 6:46 AM Pager: [phone]

## 2019-08-26 NOTE — Discharge Summary (Addendum)
Name: Heidi [name] MRN: [id number] DOB: 02-11-1951 69 y.o. PCP: Ladell Pier, MD  Date of Admission: 08/24/2019  6:55 PM Date of Discharge: 08/26/19 Attending Physician: Lenice Pressman, MD, PhD   Discharge Diagnosis: 1. Chest pain, SOB, weakness 2. Hypertension 3. Allergic conjunctivitis  Discharge Medications: Allergies as of 08/26/2019       Reactions   Januvia [sitagliptin]    Chest pains        Medication List     STOP taking these medications    atenolol 25 MG tablet Commonly known as: TENORMIN   hydrochlorothiazide 25 MG tablet Commonly known as: HYDRODIURIL   losartan 50 MG tablet Commonly known as: COZAAR       TAKE these medications    Accu-Chek FastClix Lancets Misc Use as directed to test blood sugar three times daily DX E11.65   Accu-Chek Guide test strip Generic drug: glucose blood USE AS DIRECTED TO TEST THREE TIMES DAILY   Accu-Chek Guide w/Device Kit 1 each by Does not apply route 3 (three) times daily. Use as directed to test blood sugar three times daily DX E11.65   acetic acid 2 % otic solution Place 4 drops into both ears every 6 (six) hours as needed.   albuterol (2.5 MG/3ML) 0.083% nebulizer solution Commonly known as: PROVENTIL USE 1 VIAL VIA NEBULIZER EVERY 6 HOURS AS NEEDED FOR WHEEZING OR SHORTNESS OF BREATH What changed:  how much to take how to take this when to take this reasons to take this additional instructions   albuterol 108 (90 Base) MCG/ACT inhaler Commonly known as: VENTOLIN HFA Inhale 1-2 puffs into the lungs every 6 (six) hours as needed for wheezing or shortness of breath. What changed: Another medication with the same name was changed. Make sure you understand how and when to take each.   aspirin 81 MG chewable tablet Chew 81 mg by mouth daily.   atorvastatin 40 MG tablet Commonly known as: LIPITOR TAKE 1 TABLET BY MOUTH DAILY AT 6 PM What changed:  how much to take how to take this when  to take this additional instructions   Calcium 500/D 500-200 MG-UNIT tablet Generic drug: calcium-vitamin D TAKE 1 TABLET BY MOUTH DAILY   cetirizine 10 MG tablet Commonly known as: ZYRTEC Take 1 tablet (10 mg total) by mouth daily.   famotidine 20 MG tablet Commonly known as: PEPCID TAKE 1 TABLET(20 MG) BY MOUTH AT BEDTIME What changed:  how much to take how to take this when to take this additional instructions   insulin aspart protamine- aspart (70-30) 100 UNIT/ML injection Commonly known as: NOVOLOG MIX 70/30 INJECT 80 UNITS UNDER THE SKIN IN THE MORNING AND 50 UNITS IN THE EVENING WITH MEALS What changed:  how much to take how to take this when to take this   INSULIN SYRINGE 1CC/29G 29G X 1/2" 1 ML Misc USE THREE TIMES DAILY AS DIRECTED   metFORMIN 500 MG tablet Commonly known as: GLUCOPHAGE Take 0.5 tablets (250 mg total) by mouth daily with breakfast.   mometasone-formoterol 100-5 MCG/ACT Aero Commonly known as: DULERA Inhale 2 puffs into the lungs 2 (two) times daily.   montelukast 10 MG tablet Commonly known as: SINGULAIR TAKE 1 TABLET(10 MG) BY MOUTH DAILY What changed:  how much to take how to take this when to take this additional instructions   multivitamin with minerals Tabs tablet Take 1 tablet by mouth daily.   nitroGLYCERIN 0.3 MG SL tablet Commonly known as: NITROSTAT Place 1  tablet (0.3 mg total) under the tongue every 5 (five) minutes as needed for chest pain. Max 3 tablets in 15 minutes   olopatadine 0.1 % ophthalmic solution Commonly known as: PATANOL Place 1 drop into both eyes 2 (two) times daily.   pantoprazole 40 MG tablet Commonly known as: PROTONIX Take 1 tablet (40 mg total) by mouth daily. Must have office visit for refills   Potassium Chloride ER 20 MEQ Tbcr Take 1 tablet by mouth daily.   rizatriptan 10 MG tablet Commonly known as: MAXALT Take 1 tablet (10 mg total) by mouth as needed for migraine. May repeat in 2  hours if needed. MAX 2 TABLETS PER 24 HOURS.   topiramate 25 MG capsule Commonly known as: TOPAMAX Take 3 capsules (75 mg total) by mouth 2 (two) times daily.   traMADol 50 MG tablet Commonly known as: ULTRAM Take 1 tablet (50 mg total) by mouth every 8 (eight) hours as needed. What changed: reasons to take this               Durable Medical Equipment  (From admission, onward)           Start     Ordered   08/26/19 1318  DME Tub Bench  Once     08/26/19 1318   08/26/19 1318  DME Oxygen  Once    Question Answer Comment  Length of Need 6 Months   Mode or (Route) Nasal cannula   Liters per Minute 2   Frequency Continuous (stationary and portable oxygen unit needed)   Oxygen delivery system Gas      08/26/19 1318            Disposition and follow-up:   Ms.Heidi [name] was discharged from [facility] in Stable condition.  At the hospital follow up visit please address:  1.  Chest pain, SOB: Chest pain had typical features, labs and ekg unremarkable. Will need stress test. Patient will also need PFTs to evaluate for asthma vs COPD.   HTN: Held losartan and metoprolol, assess need to restart  Allergic conjunctivitis: Given olopatadine, assess improvement  2.  Labs / imaging needed at time of follow-up: PFTs, stress test  3.  Pending labs/ test needing follow-up: None  Follow-up Appointments: Follow-up Information     Ladell Pier, MD. Schedule an appointment as soon as possible for a visit in 1 week(s).   Specialty: Internal Medicine Contact information: [place] [address] [address] [facility] Follow up.   Why: Please call for issues regarding your oxygen.  Contact information: [place] [address] [phone]            Hospital Course by problem list: 1. Chest pain, SOB, weakness: This is a 69 year old female with a history of anemia, anxiety, GERD,  migraines, asthma, DM, HLD, HTN, HFpEF, and OSA who presented with chest pain. Typical chest pain, worsened with activity, improved with rest, radiated down arm. Heart score of 4, moderate risk. EKG and troponin's unremarkable. BNP normal. CXR unremarkable. Some hypoxia noted and patient was started on supplemental oxygen. Continued on aspirin 81 mg daily, lasix, and home inhalers for her asthma. Chest pain resolved with no other interventions, initial plan was to d/c and have outpatient stress test. Patient ambulated with PT and she started having generalized weakness, shortness of breath, and had decreased blood pressures. Held her  BP medications, monitored her overnight. Had some improvement in her symptoms the next day, she ambulated with nurse and met requirements for home oxygen. Patient will need outpatient stress test and PFTs to evaluate her shortness of breath.   2. Hypertension: On atenolol 25 mg and losartan 50 mg daily at home. Patient had episode of hypotension during admission. Held these medications with improvement of her blood pressure. Discontinued on discharge.   3. Allergic conjunctivitis: Noted BL eye redness and clear drainage, started on olontipine while here with some improvement. No vision changes or eye pain.   Discharge Vitals:   BP (!) 110/52   Pulse (!) 110   Temp 98.2 F (36.8 C) (Oral)   Resp 17   Ht _0  (1.6 m)   Wt 106.6 kg   SpO2 98%   BMI 41.63 kg/m   Pertinent Labs, Studies, and Procedures:   CBC Latest Ref Rng & Units 08/25/2019 08/24/2019 02/07/2019  WBC 4.0 - 10.5 K/uL 7.1 8.1 7.9  Hemoglobin 12.0 - 15.0 g/dL 14.0 14.6 13.8  Hematocrit 36.0 - 46.0 % 46.3(H) 46.9(H) 44.1  Platelets 150 - 400 K/uL 185 194 242   BMP Latest Ref Rng & Units 08/26/2019 08/25/2019 08/24/2019  Glucose 70 - 99 mg/dL 422(H) 166(H) 170(H)  BUN 8 - 23 mg/dL _1 Creatinine 0.44 - 1.00 mg/dL 1.40(H) 1.07(H) 1.12(H)  BUN/Creat Ratio 12 - 28 - - -  Sodium 135 - 145 mmol/L 138  140 139  Potassium 3.5 - 5.1 mmol/L 5.0 3.1(L) 3.7  Chloride 98 - 111 mmol/L 98 97(L) 99  CO2 22 - 32 mmol/L 32 31 30  Calcium 8.9 - 10.3 mg/dL 9.1 9.0 9.4     Discharge Instructions: Discharge Instructions     Call MD for:  difficulty breathing, headache or visual disturbances   Complete by: As directed    Call MD for:  extreme fatigue   Complete by: As directed    Call MD for:  hives   Complete by: As directed    Call MD for:  persistant dizziness or light-headedness   Complete by: As directed    Call MD for:  persistant nausea and vomiting   Complete by: As directed    Call MD for:  redness, tenderness, or signs of infection (pain, swelling, redness, odor or green/yellow discharge around incision site)   Complete by: As directed    Call MD for:  severe uncontrolled pain   Complete by: As directed    Call MD for:  temperature >100.4   Complete by: As directed    Diet - low sodium heart healthy   Complete by: As directed    Increase activity slowly   Complete by: As directed        Signed: Asencion Noble, MD 08/26/2019, 2:41 PM   Pager: [phone]

## 2019-08-26 NOTE — TOC Transition Note (Signed)
Transition of Care [facility]) - CM/SW Discharge Note   Patient Details  Name: Heidi [name] MRN: [id number] Date of Birth: 10-19-1950  Transition of [facility]) CM/SW Contact:  Claudie Leach, RN 08/26/2019, 2:07 PM   Clinical Narrative:    DME Oxygen ordered from on call service/[facility] of [employer].  Portable tank will be delivered to room prior to d/c.  Tub bench ordered from [facility] with [facility].  Tub bench will be delivered prior to d/c.    Final next level of care: Home/Self Care     Patient Goals and CMS Choice     Choice offered to / list presented to : Patient   Discharge Plan and Services                DME Arranged: Tub bench, Oxygen DME Agency: Eldridge Abrahams Date DME Agency Contacted: 08/26/19 Time DME Agency ContactedIN:[id number] Representative spoke with at DME Agency: Gracy Bruins for oxygen), Jeneen Rinks ([employer] for tub bench)

## 2019-08-26 NOTE — Discharge Instructions (Signed)
Heidi [name],   It has been a pleasure working with you and we are glad you're feeling better. You were hospitalized for chest pain and shortness of breath. In regards to your chest pain, your symptoms were very concerning, the labs and testing were all normal. You will need further testing done by your primary care provider. You will likely need a stress test. In regards to your shortness of breath, this could be due to your underlying lung issues. Your imaging and labs all look normal so it does not seem like an infection. We have arranged for you to get oxygen at home. Please continue with your [facility] and albuterol inhalers. You will need further testing done to see what the underlying cause is.  Follow up with your primary care provider in 1 weeks  If your symptoms worsen or you develop new symptoms, please seek medical help whether it is your primary care provider or emergency department.

## 2019-08-26 NOTE — Progress Notes (Signed)
SATURATION QUALIFICATIONS: (This note is used to comply with regulatory documentation for home oxygen)  Patient Saturations on Room Air at Rest = 88%  Patient Saturations on Room Air while Ambulating = 84%  Patient Saturations on 2 Liters of oxygen while Ambulating = 98%  Please briefly explain why patient needs home oxygen: Pt desat to 84% while ambulating about 50 ft.  She will need home oxygen.

## 2019-08-28 ENCOUNTER — Telehealth: Payer: Self-pay

## 2019-08-28 NOTE — Telephone Encounter (Signed)
Transition Care Management Follow-up Telephone Call  Date of discharge and from where: 08/26/2019 [name]   How have you been since you were released from the hospital? Feeling ok, still  little weak  Any questions or concerns? EAR concerns/ stated ears have been checked in the ER but still feeling like something is going on/ discomfort Items Reviewed:  Did the pt receive and understand the discharge instructions provided? YES    Medications obtained and verified?   YES. Pt stated since discharged did not stop BP med as instructed by the ED provider, and continuing to take them when high readings are obtained   >140/80    Any new allergies since your discharge? NONE  Dietary orders reviewed? YES   Do you have support at home? Grandsons  Functional Questionnaire: (I = Independent and D = Dependent) ADLs: I  Bathing/Dressing- I  Meal Prep- I  Eating- I  Maintaining continence- I  Transferring/Ambulation- I  Managing Meds- I  Follow up appointments reviewed:   PCP Hospital f/u appt confirmed?  Scheduled to see Heidi [name] on 08/30/2019 @ 10:50am  Specialist Hospital f/u appt confirmed?     Are transportation arrangements needed? No   If their condition worsens, is the pt aware to call PCP or go to the Emergency Dept.? YES  Was the patient provided with contact information for the PCP's office or ED? YES  Was to pt encouraged to call back with questions or concerns? YES  Other DME  Pt stated since discharged did not stop BP med as instructed, and continuing to take them when high readings are obtained   >140/80 Pt stated being discharged with a glucometer at home, but not being able to use it due to insurance not covering the strips. Asked if has any other glucometer that she could check routinely her BG. Stated she has but wasnt able to do so . Last check was done in the hospital. Pt stated had been using her O2 @2L  /hr as needed. Has been released with 3  tanks. Has [employer] [facility] nr and contact info if needed more. Instructed pt to call back if questions and concerns, or need any help. Verbalized understanding

## 2019-08-28 NOTE — Progress Notes (Signed)
Patient ID: Heidi [name], female   DOB: September 04, 1950, 69 y.o.   MRN: [id number] Virtual Visit via Telephone Note  I connected with [name] on 08/30/19 at  3:50 PM EST by telephone and verified that I am speaking with the correct person using two identifiers.   I discussed the limitations, risks, security and privacy concerns of performing an evaluation and management service by telephone and the availability of in person appointments. I also discussed with the patient that there may be a patient responsible charge related to this service. The patient expressed understanding and agreed to proceed.  PATIENT visit by telephone virtually in the context of Covid-19 pandemic. Patient location:  home My Location:  [facility] office Persons on the call:  Me and the patient   History of Present Illness: After hospitalization 2/18-2/20/2021.  She is not checking her blood sugars but she was able to get glucometer strips but her glucometer seems to not be working properly.  She said she is taking her glucometer back to [facility] today to have them look at it with her.  She has not checked blood sugars since leaving the hospital but denies s/sx of hyper/hypoglycemia.  Currently taking her insulin 70/30 and has a new glucometer (One Touch) that she isn't sure is working properly.  She is making big diet changes.  Currently 50u in the morning and 50u at night.  No further CP.  No HA/dizziness.  Has not made cardiology or pulmonology appt.  Still having some intermittent ear pain.  She was able to get all of her meds on discharge.  Able to perform ADL.    BP at home 120/70; pulse 88,  122/72 p 76.  Taking her home meds which she tells me are losartan 30m, atenolol 212m and HCTZ 2543maily.  They held these at the hospital but BP went back up after discharge, so she has resumed taking them.    From discharge summary: Hospital Course by problem list: 1. Chest pain, SOB, weakness: This is a 69 96ar old female with  a history of anemia, anxiety, GERD, migraines, asthma, DM, HLD, HTN, HFpEF, and OSA who presented with chest pain. Typical chest pain, worsened with activity, improved with rest, radiated down arm. Heart score of4, moderate risk. EKG and troponin's unremarkable. BNP normal. CXR unremarkable. Some hypoxia noted and patient was started on supplemental oxygen. Continued on aspirin 81 mg daily, lasix, and home inhalers for her asthma. Chest pain resolved with no other interventions, initial plan was to d/c and have o/p stress test. Patient ambulated with PT and she started having generalized weakness, shortness of breath, and had decreased blood pressures. Held her BP medications, monitored her overnight. Had some improvement in her symptoms the next day, she ambulated with nurse and met requirements for home oxygen. Patient will need outpatient stress test and PFTs to evaluate her shortness of breath.   2. Hypertension: On atenolol 25 mg and losartan 50 mg daily at home. Patient had episode of hypotension during admission. Held these medications with improvement of her blood pressure. Discontinued on discharge.   3. Allergic conjunctivitis: Noted BL eye redness and clear drainage, started on olontipine while here with some improvement. No vision changes or eye pain.     Observations/Objective:  NAD.  A&Ox3   Assessment and Plan: 1. Chest pain, unspecified type No further CP - Ambulatory referral to Cardiology - nitroGLYCERIN (NITROSTAT) 0.3 MG SL tablet; Place 1 tablet (0.3 mg total) under the tongue every 5 (  five) minutes as needed for chest pain. Max 3 tablets in 15 minutes  Dispense: 30 tablet; Refill: 1  2. Shortness of breath Per discharge summary- - Ambulatory referral to Pulmonology  3. Essential hypertension Controlled back on meds- - losartan (COZAAR) 50 MG tablet; Take 1 tablet (50 mg total) by mouth daily.  Dispense: 90 tablet; Refill: 3 - hydrochlorothiazide (HYDRODIURIL) 25 MG  tablet; Take 1 tablet (25 mg total) by mouth daily.  Dispense: 90 tablet; Refill: 3 - atenolol (TENORMIN) 25 MG tablet; Take 1 tablet (25 mg total) by mouth daily.  Dispense: 90 tablet; Refill: 3  4. 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  Make appt with Lurena Joiner in 7-10 days for diabetes management and let us know if she needs an additional glucometer  5. Upper airway cough syndrome - montelukast (SINGULAIR) 10 MG tablet; Take 1 tablet (10 mg total) by mouth at bedtime.  Dispense: 90 tablet; Refill: 1  6. Hospital discharge follow-up  7. Otalgia, unspecified laterality - Ambulatory referral to ENT   Follow Up Instructions: Lurena Joiner in 7-10 days BP/DM and 1-2 months with PCP   I discussed the assessment and treatment plan with the patient. The patient was provided an opportunity to ask questions and all were answered. The patient agreed with the plan and demonstrated an understanding of the instructions.   The patient was advised to call back or seek an in-person evaluation if the symptoms worsen or if the condition fails to improve as anticipated.  I provided 15 minutes of non-face-to-face time during this encounter.   Freeman Caldron, PA-C

## 2019-08-30 ENCOUNTER — Other Ambulatory Visit: Payer: Self-pay

## 2019-08-30 ENCOUNTER — Ambulatory Visit: Payer: Medicare (Managed Care) | Attending: Internal Medicine | Admitting: Physician Assistant

## 2019-08-30 DIAGNOSIS — R079 Chest pain, unspecified: Secondary | ICD-10-CM | POA: Diagnosis not present

## 2019-08-30 DIAGNOSIS — E11319 Type 2 diabetes mellitus with unspecified diabetic retinopathy without macular edema: Secondary | ICD-10-CM

## 2019-08-30 DIAGNOSIS — R0602 Shortness of breath: Secondary | ICD-10-CM

## 2019-08-30 DIAGNOSIS — R05 Cough: Secondary | ICD-10-CM

## 2019-08-30 DIAGNOSIS — I10 Essential (primary) hypertension: Secondary | ICD-10-CM

## 2019-08-30 DIAGNOSIS — R058 Other specified cough: Secondary | ICD-10-CM

## 2019-08-30 DIAGNOSIS — Z09 Encounter for follow-up examination after completed treatment for conditions other than malignant neoplasm: Secondary | ICD-10-CM

## 2019-08-30 DIAGNOSIS — H9209 Otalgia, unspecified ear: Secondary | ICD-10-CM

## 2019-08-30 DIAGNOSIS — Z794 Long term (current) use of insulin: Secondary | ICD-10-CM

## 2019-08-30 MED ORDER — MONTELUKAST SODIUM 10 MG PO TABS: 10.0000 mg | ORAL_TABLET | Freq: Every day | ORAL | 1 refills | Status: DC

## 2019-08-30 MED ORDER — ATENOLOL 25 MG PO TABS: 25.0000 mg | ORAL_TABLET | Freq: Every day | ORAL | 3 refills | Status: DC

## 2019-08-30 MED ORDER — LOSARTAN POTASSIUM 50 MG PO TABS: 50.0000 mg | ORAL_TABLET | Freq: Every day | ORAL | 3 refills | Status: DC

## 2019-08-30 MED ORDER — NITROGLYCERIN 0.3 MG SL SUBL: 0.3000 mg | SUBLINGUAL_TABLET | SUBLINGUAL | 1 refills | Status: DC | PRN

## 2019-08-30 MED ORDER — HYDROCHLOROTHIAZIDE 25 MG PO TABS: 25.0000 mg | ORAL_TABLET | Freq: Every day | ORAL | 3 refills | Status: DC

## 2019-08-30 NOTE — Progress Notes (Signed)
DOB and name verified Hospital f /u  Problems with glucometer/ Did not check BG since she left  The hospital

## 2019-08-31 ENCOUNTER — Telehealth: Payer: Self-pay | Admitting: Internal Medicine

## 2019-08-31 MED ORDER — ONETOUCH VERIO FLEX SYSTEM W/DEVICE KIT: PACK | 0 refills | Status: DC

## 2019-08-31 MED ORDER — ONETOUCH DELICA LANCETS 33G MISC: 6 refills | Status: DC

## 2019-08-31 MED ORDER — ONETOUCH VERIO VI STRP: ORAL_STRIP | 6 refills | Status: DC

## 2019-08-31 NOTE — Telephone Encounter (Signed)
Rx sent 

## 2019-08-31 NOTE — Telephone Encounter (Signed)
1) Medication(s) Requested (by name): - One touch Verio flex Lancets   2) Pharmacy of Choice: [facility] [id number] - [place], [place]

## 2019-09-19 ENCOUNTER — Encounter: Payer: Self-pay | Admitting: Physician Assistant

## 2019-09-28 ENCOUNTER — Telehealth: Payer: Self-pay | Admitting: Neurology

## 2019-09-28 NOTE — Telephone Encounter (Signed)
09/28/2019 Botox B/B - [id number] NPR  10/03/2019-10/02/2020 NPR - [id number] 10/03/2019-10/02/2020 auth # [id number] Jayme Cloud

## 2019-10-03 ENCOUNTER — Ambulatory Visit: Payer: Medicare HMO | Admitting: Neurology

## 2019-10-03 ENCOUNTER — Telehealth: Payer: Self-pay | Admitting: *Deleted

## 2019-10-03 NOTE — Telephone Encounter (Signed)
Patient called to reschedule her botox apt

## 2019-10-03 NOTE — Telephone Encounter (Signed)
Patient states that she accidentally canceled her botox appointment with the automated system and called to rescheduled.  The next available appointment is in May.  She is wondering if there is anyway possible she could be worked in sooner?

## 2019-10-04 NOTE — Telephone Encounter (Signed)
Spoke with pt. She could not come today. Has some sinus/allergy symptoms that are worse in the rain. Pt scheduled for next Thurs 4/8 @ 4:00 pm.

## 2019-10-12 ENCOUNTER — Ambulatory Visit: Payer: Medicare HMO | Admitting: Neurology

## 2019-10-12 ENCOUNTER — Other Ambulatory Visit: Payer: Self-pay

## 2019-10-12 DIAGNOSIS — G43709 Chronic migraine without aura, not intractable, without status migrainosus: Secondary | ICD-10-CM

## 2019-10-12 NOTE — Progress Notes (Signed)
Botox- 200 units x 1 vial Lot: [id number] Expiration: 06/2022 NDC: 0023-3921-02  Bacteriostatic 0.9% Sodium Chloride- 4mL total Lot: [id number] Expiration: 01/04/2020 NDC: 0409-1966-02  Dx: G43.709 B/B   

## 2019-10-12 NOTE — Progress Notes (Signed)
Interval history 10/12/2019: Patient with chronic intractable migraines.  doing exceptionally well on Botox for Migraines, only botox and topiramate have ever helped. Prior tried multiple classes of medications which did not help with headaches or migraines until she started Botox. Since starting botox she has had  >90% improvement in frequency and severity, had to redo last botox and she did well again. The migraines she does have are sparse, are when botox is wearing off and are easier to treat and >60% less severe.Nothing has worked in the past except botox and Topiramate.  NO temples, no masseters, no LS, no eyes  Consent Form Botulism Toxin Injection For Chronic Migraine    Reviewed orally with patient, additionally signature is on file:  Botulism toxin has been approved by the Federal drug administration for treatment of chronic migraine. Botulism toxin does not cure chronic migraine and it may not be effective in some patients.  The administration of botulism toxin is accomplished by injecting a small amount of toxin into the muscles of the neck and head. Dosage must be titrated for each individual. Any benefits resulting from botulism toxin tend to wear off after 3 months with a repeat injection required if benefit is to be maintained. Injections are usually done every 3-4 months with maximum effect peak achieved by about 2 or 3 weeks. Botulism toxin is expensive and you should be sure of what costs you will incur resulting from the injection.  The side effects of botulism toxin use for chronic migraine may include:   -Transient, and usually mild, facial weakness with facial injections  -Transient, and usually mild, head or neck weakness with head/neck injections  -Reduction or loss of forehead facial animation due to forehead muscle weakness  -Eyelid drooping  -Dry eye  -Pain at the site of injection or bruising at the site of injection  -Double vision  -Potential unknown long term  risks  Contraindications: You should not have Botox if you are pregnant, nursing, allergic to albumin, have an infection, skin condition, or muscle weakness at the site of the injection, or have myasthenia gravis, Lambert-Eaton syndrome, or ALS.  It is also possible that as with any injection, there may be an allergic reaction or no effect from the medication. Reduced effectiveness after repeated injections is sometimes seen and rarely infection at the injection site may occur. All care will be taken to prevent these side effects. If therapy is given over a long time, atrophy and wasting in the muscle injected may occur. Occasionally the patient's become refractory to treatment because they develop antibodies to the toxin. In this event, therapy needs to be modified.  I have read the above information and consent to the administration of botulism toxin.    BOTOX PROCEDURE NOTE FOR MIGRAINE HEADACHE    Contraindications and precautions discussed with patient(above). Aseptic procedure was observed and patient tolerated procedure. Procedure performed by Dr. Georgia Dom  The condition has existed for more than 6 months, and pt does not have a diagnosis of ALS, Myasthenia Gravis or Lambert-Eaton Syndrome.  Risks and benefits of injections discussed and pt agrees to proceed with the procedure.  Written consent obtained  These injections are medically necessary. Pt  receives good benefits from these injections. These injections do not cause sedations or hallucinations which the oral therapies may cause.  Description of procedure:  The patient was placed in a sitting position. The standard protocol was used for Botox as follows, with 5 units of Botox injected at each  site:   -Procerus muscle, midline injection  -Corrugator muscle, bilateral injection  -Frontalis muscle, bilateral injection, with 2 sites each side, medial injection was performed in the upper one third of the frontalis muscle, in  the region vertical from the medial inferior edge of the superior orbital rim. The lateral injection was again in the upper one third of the forehead vertically above the lateral limbus of the cornea, 1.5 cm lateral to the medial injection site.  -Temporalis muscle injection, 4 sites, bilaterally. The first injection was 3 cm above the tragus of the ear, second injection site was 1.5 cm to 3 cm up from the first injection site in line with the tragus of the ear. The third injection site was 1.5-3 cm forward between the first 2 injection sites. The fourth injection site was 1.5 cm posterior to the second injection site.   -Occipitalis muscle injection, 3 sites, bilaterally. The first injection was done one half way between the occipital protuberance and the tip of the mastoid process behind the ear. The second injection site was done lateral and superior to the first, 1 fingerbreadth from the first injection. The third injection site was 1 fingerbreadth superiorly and medially from the first injection site.  -Cervical paraspinal muscle injection, 2 sites, bilateral knee first injection site was 1 cm from the midline of the cervical spine, 3 cm inferior to the lower border of the occipital protuberance. The second injection site was 1.5 cm superiorly and laterally to the first injection site.  -Trapezius muscle injection was performed at 3 sites, bilaterally. The first injection site was in the upper trapezius muscle halfway between the inflection point of the neck, and the acromion. The second injection site was one half way between the acromion and the first injection site. The third injection was done between the first injection site and the inflection point of the neck.   Will return for repeat injection in 3 months.   200 units of Botox was used, any Botox not injected was wasted. The patient tolerated the procedure well, there were no complications of the above procedure.

## 2019-10-13 ENCOUNTER — Ambulatory Visit: Payer: Medicare HMO | Admitting: Internal Medicine

## 2019-10-13 ENCOUNTER — Telehealth: Payer: Self-pay | Admitting: *Deleted

## 2019-10-13 NOTE — Telephone Encounter (Signed)
Pt was on Dr. Blanch Media schedule for 10/17/19.  When prepping the chart we saw that she had not seen Dr. Henrene Pastor or a pre visit nurse for instructions.  Spoke with patient and she states that she has not had an office visit or PV appointment.    She will need to be rescheduled for her procedure and also needs a pre visit or office visit.

## 2019-10-17 ENCOUNTER — Encounter: Payer: Medicare (Managed Care) | Admitting: Internal Medicine

## 2019-10-19 ENCOUNTER — Encounter: Payer: Self-pay | Admitting: Internal Medicine

## 2019-10-19 NOTE — Telephone Encounter (Signed)
Patient was suppose to call back for pre-visit appointment. Placed another call for her to reschedule left voicemail.

## 2019-10-24 DIAGNOSIS — R079 Chest pain, unspecified: Secondary | ICD-10-CM | POA: Diagnosis not present

## 2019-10-30 ENCOUNTER — Other Ambulatory Visit: Payer: Self-pay | Admitting: Neurology

## 2019-11-02 ENCOUNTER — Other Ambulatory Visit: Payer: Self-pay | Admitting: Internal Medicine

## 2019-11-02 DIAGNOSIS — Z1231 Encounter for screening mammogram for malignant neoplasm of breast: Secondary | ICD-10-CM

## 2019-11-06 ENCOUNTER — Ambulatory Visit: Payer: Medicare HMO

## 2019-11-07 ENCOUNTER — Ambulatory Visit: Payer: Medicare (Managed Care) | Admitting: Neurology

## 2019-11-13 ENCOUNTER — Other Ambulatory Visit: Payer: Self-pay

## 2019-11-13 ENCOUNTER — Encounter: Payer: Self-pay | Admitting: Internal Medicine

## 2019-11-13 ENCOUNTER — Ambulatory Visit: Payer: Medicare HMO | Attending: Internal Medicine | Admitting: Internal Medicine

## 2019-11-13 ENCOUNTER — Ambulatory Visit (HOSPITAL_BASED_OUTPATIENT_CLINIC_OR_DEPARTMENT_OTHER): Payer: Medicare HMO | Admitting: Pharmacist

## 2019-11-13 VITALS — BP 133/72 | HR 93 | Temp 96.1°F | Resp 16 | Wt 238.6 lb

## 2019-11-13 DIAGNOSIS — G43709 Chronic migraine without aura, not intractable, without status migrainosus: Secondary | ICD-10-CM | POA: Diagnosis not present

## 2019-11-13 DIAGNOSIS — G959 Disease of spinal cord, unspecified: Secondary | ICD-10-CM | POA: Diagnosis not present

## 2019-11-13 DIAGNOSIS — Z794 Long term (current) use of insulin: Secondary | ICD-10-CM | POA: Diagnosis not present

## 2019-11-13 DIAGNOSIS — Z23 Encounter for immunization: Secondary | ICD-10-CM

## 2019-11-13 DIAGNOSIS — G8929 Other chronic pain: Secondary | ICD-10-CM

## 2019-11-13 DIAGNOSIS — IMO0002 Reserved for concepts with insufficient information to code with codable children: Secondary | ICD-10-CM

## 2019-11-13 DIAGNOSIS — I10 Essential (primary) hypertension: Secondary | ICD-10-CM | POA: Diagnosis not present

## 2019-11-13 DIAGNOSIS — J9611 Chronic respiratory failure with hypoxia: Secondary | ICD-10-CM

## 2019-11-13 DIAGNOSIS — E11319 Type 2 diabetes mellitus with unspecified diabetic retinopathy without macular edema: Secondary | ICD-10-CM | POA: Diagnosis not present

## 2019-11-13 DIAGNOSIS — M542 Cervicalgia: Secondary | ICD-10-CM | POA: Diagnosis not present

## 2019-11-13 DIAGNOSIS — Z9981 Dependence on supplemental oxygen: Secondary | ICD-10-CM | POA: Insufficient documentation

## 2019-11-13 LAB — GLUCOSE, POCT (MANUAL RESULT ENTRY): POC Glucose: 137 mg/dl — AB (ref 70–99)

## 2019-11-13 MED ORDER — TRAMADOL HCL 50 MG PO TABS: 50.0000 mg | ORAL_TABLET | Freq: Three times a day (TID) | ORAL | 0 refills | Status: DC | PRN

## 2019-11-13 MED ORDER — BUDESONIDE-FORMOTEROL FUMARATE 80-4.5 MCG/ACT IN AERO: 2.0000 | INHALATION_SPRAY | Freq: Two times a day (BID) | RESPIRATORY_TRACT | 3 refills | Status: DC

## 2019-11-13 MED ORDER — INSULIN ASPART PROT & ASPART (70-30 MIX) 100 UNIT/ML ~~LOC~~ SUSP: SUBCUTANEOUS | 6 refills | Status: DC

## 2019-11-13 MED ORDER — TOPIRAMATE 25 MG PO CPSP: 75.0000 mg | ORAL_CAPSULE | Freq: Two times a day (BID) | ORAL | 3 refills | Status: DC

## 2019-11-13 NOTE — Patient Instructions (Addendum)
After receiving the Pneumovax vaccine today, you should hold off on getting your COVID-19 vaccine for at least a month.  Please remember to schedule your follow-up appointment with your eye doctor.  I will have them schedule the appointment for you to see the pulmonologist Dr. Melvyn Novas in follow-up.  Pneumococcal Polysaccharide Vaccine (PPSV23): What You Need to Know 1. Why get vaccinated? Pneumococcal polysaccharide vaccine (PPSV23) can prevent pneumococcal disease. Pneumococcal disease refers to any illness caused by pneumococcal bacteria. These bacteria can cause many types of illnesses, including pneumonia, which is an infection of the lungs. Pneumococcal bacteria are one of the most common causes of pneumonia. Besides pneumonia, pneumococcal bacteria can also cause:  Ear infections  Sinus infections  Meningitis (infection of the tissue covering the brain and spinal cord)  Bacteremia (bloodstream infection) Anyone can get pneumococcal disease, but children under 48 years of age, people with certain medical conditions, adults 58 years or older, and cigarette smokers are at the highest risk. Most pneumococcal infections are mild. However, some can result in long-term problems, such as brain damage or hearing loss. Meningitis, bacteremia, and pneumonia caused by pneumococcal disease can be fatal. 2. PPSV23 PPSV23 protects against 23 types of bacteria that cause pneumococcal disease. PPSV23 is recommended for:  All adults 12 years or older,  Anyone 2 years or older with certain medical conditions that can lead to an increased risk for pneumococcal disease. Most people need only one dose of PPSV23. A second dose of PPSV23, and another type of pneumococcal vaccine called PCV13, are recommended for certain high-risk groups. Your health care provider can give you more information. People 65 years or older should get a dose of PPSV23 even if they have already gotten one or more doses of the  vaccine before they turned 40. 3. Talk with your health care provider Tell your vaccine provider if the person getting the vaccine:  Has had an allergic reaction after a previous dose of PPSV23, or has any severe, life-threatening allergies. In some cases, your health care provider may decide to postpone PPSV23 vaccination to a future visit. People with minor illnesses, such as a cold, may be vaccinated. People who are moderately or severely ill should usually wait until they recover before getting PPSV23. Your health care provider can give you more information. 4. Risks of a vaccine reaction  Redness or pain where the shot is given, feeling tired, fever, or muscle aches can happen after PPSV23. People sometimes faint after medical procedures, including vaccination. Tell your provider if you feel dizzy or have vision changes or ringing in the ears. As with any medicine, there is a very remote chance of a vaccine causing a severe allergic reaction, other serious injury, or death. 5. What if there is a serious problem? An allergic reaction could occur after the vaccinated person leaves the clinic. If you see signs of a severe allergic reaction (hives, swelling of the face and throat, difficulty breathing, a fast heartbeat, dizziness, or weakness), call 9-1-1 and get the person to the nearest hospital. For other signs that concern you, call your health care provider. Adverse reactions should be reported to the Vaccine Adverse Event Reporting System (VAERS). Your health care provider will usually file this report, or you can do it yourself. Visit the VAERS website at www.vaers.SamedayNews.es or call (860)057-0273. VAERS is only for reporting reactions, and VAERS staff do not give medical advice. 6. How can I learn more?  Ask your health care provider.  Call your local  or state health department.  Contact the Centers for Disease Control and Prevention (CDC): ? Call (406)861-5800 (1-800-CDC-INFO)  or ? Visit CDC's website at http://hunter.com/ CDC Vaccine Information Statement PPSV23 Vaccine (05/04/2018) This information is not intended to replace advice given to you by your health care provider. Make sure you discuss any questions you have with your health care provider. Document Revised: 10/11/2018 Document Reviewed: 02/01/2018 Elsevier Patient Education  [place].

## 2019-11-13 NOTE — Progress Notes (Signed)
Patient ID: Heidi [name], female    DOB: 1950-10-01  MRN: [id number]  CC: Diabetes and Hypertension   Subjective: Heidi [name] is a 69 y.o. female who presents for chronic ds management Her concerns today include:  history of HTN,DM 2 with retinopathy, HL, CKD 3,diastolic CHF, migraines([facility] on Botox inj),OSA (noton BiPAP),UACS,GERD, OA knees,DDD of cervical spine ([facility])obesity, renal cyst, LT thyroid nodule.  Had televisit with PA post hosp 08/2019 for CP/SOB, hypoxia and hypotension.  Recommended referral to cardiology for ST and pulmonary for PFT.  Sent home on O2 2 Lt. Echo revealed normal heart function with mild enlargement of the ventricles. BNP was normal. D-dimer was negative. Pt referred on last visit to cardiology and pulmonary. She was called by the cardiologist office to schedule an appointment but she never returned the call. Patient states she has too many other appointments to attend to at this time. -no CP over past mth.  -Feels O2 helps her breathing. "It has build my lungs up."  Uses it several hrs a day.  If she moves around and feels short of breath she sits and uses the oxygen. D-dimer was done during hospitalization in February and it was negative. -Last saw pulmonary Dr. Melvyn Novas 2 years ago and was assessed to have cough variant asthma versus UA CS using Symbicort BID and Albuterol MDI 2 x a day. PFTs done 2 years ago revealed moderate restriction with severe diffusion defect. -  DM/Obesity: checking TID before meals.  She has log with her. Over the past week, morning blood sugars have been 93-145. She had 1 outlier of 186. Blood sugars before lunch have been 82-102 and before dinner 1 25-1 60. On Novolog 70/30 80 units in a.m/50 units in evenings Request 6 viles to last a mth. Usually gets 3 vials of insulin and it is not sufficient to last a month. Doing better with eating habits. Lots of fruits and veggies.  Less bread She  canceled eye exam because daughter has been sick.  Needs daughter to drive her there and back.  She plans to reschedule.    HTN: She restarted Atenolol 25 mg, HCTZ 25 mg, Cozaar 50 mg after leaving hosp.  She has device and has been checking BP.  Log:  128/70, 132/72, 132/72, 122/78, 132/68, 130/68 P70-80 -limits salt in foods  Requesting a refill on tramadol to take as needed for neck pain until she gets into see her pain specialist. Last MRI of the neck done in 2017 revealed multilevel degenerative spondylosis with some degree of bilateral foramen stenosis. She has not seen her pain specialist in a while due to some financial limitations.  HM: She is due for Pneumovax. She has not received COVID-19 vaccine as yet but states she plans to get it. Colonoscopy is scheduled for June. She plans to reschedule her eye exam.  Patient Active Problem List   Diagnosis Date Noted  . Chest pain 08/24/2019  . Cervical myelopathy ([place]) 08/16/2018  . Diabetic retinopathy of right eye associated with type 2 diabetes mellitus ([place]) 08/16/2018  . Upper airway cough syndrome 05/03/2018  . Morbid obesity due to excess calories ([place]) complicated by hbp/ dm / hyperlipidemia 09/10/2017  . Cough variant asthma vs UACS/vcd on ACEi  09/09/2017  . Anxiety and depression 09/06/2017  . Mild persistent asthma without complication 08/67/6195  . Paranoia ([place]) 09/06/2017  . Torn left ear lobe 11/20/2016  . Chronic migraine w/o aura w/o status migrainosus, not intractable 06/27/2016  .  Post-menopausal atrophic vaginitis 11/14/2015  . Primary osteoarthritis of both knees 07/18/2015  . GERD (gastroesophageal reflux disease) 05/08/2015  . Renal cyst 05/08/2015  . Adenomatous polyp of colon 03/26/2015  . Essential hypertension 02/12/2015  . Cognitive deficit due to old head trauma 02/12/2015  . Asthma, chronic 02/12/2015  . Chronic left shoulder pain 02/11/2015  . Thyroid nodule 12/18/2014  . Chronic neck pain   . HLD  (hyperlipidemia)   . OSA treated with BiPAP 11/27/2014  . Obesity hypoventilation syndrome ([place]) 11/27/2014  . Demand ischemia ([place])   . Diabetes type 2, uncontrolled ([place])   . Cardiomegaly 11/25/2014  . CHF (congestive heart failure) ([place]) 11/25/2014  . DM type 2 causing CKD stage 2 ([place]) 11/25/2014     Current Outpatient Medications on File Prior to Visit  Medication Sig Dispense Refill  . acetic acid 2 % otic solution Place 4 drops into both ears every 6 (six) hours as needed. 15 mL 0  . albuterol (PROVENTIL) (2.5 MG/3ML) 0.083% nebulizer solution USE 1 VIAL VIA NEBULIZER EVERY 6 HOURS AS NEEDED FOR WHEEZING OR SHORTNESS OF BREATH (Patient taking differently: Take 2.5 mg by nebulization every 6 (six) hours as needed for wheezing or shortness of breath. ) 25 vial 0  . albuterol (VENTOLIN HFA) 108 (90 Base) MCG/ACT inhaler Inhale 1-2 puffs into the lungs every 6 (six) hours as needed for wheezing or shortness of breath. 8 g 0  . aspirin 81 MG chewable tablet Chew 81 mg by mouth daily.     Marland Kitchen atenolol (TENORMIN) 25 MG tablet Take 1 tablet (25 mg total) by mouth daily. 90 tablet 3  . atorvastatin (LIPITOR) 40 MG tablet TAKE 1 TABLET BY MOUTH DAILY AT 6 PM (Patient taking differently: Take 40 mg by mouth daily. ) 30 tablet 11  . Blood Glucose Monitoring Suppl (ONETOUCH VERIO FLEX SYSTEM) w/Device KIT Use as directed to test blood sugar three times daily DX E11.65 1 kit 0  . CALCIUM 500/D 500-200 MG-UNIT tablet TAKE 1 TABLET BY MOUTH DAILY (Patient not taking: Reported on 11/13/2019) 30 tablet 2  . cetirizine (ZYRTEC) 10 MG tablet Take 1 tablet (10 mg total) by mouth daily. 30 tablet 0  . famotidine (PEPCID) 20 MG tablet TAKE 1 TABLET(20 MG) BY MOUTH AT BEDTIME (Patient taking differently: Take 20 mg by mouth at bedtime. ) 90 tablet 2  . glucose blood (ONETOUCH VERIO) test strip Use as directed to test blood sugar three times daily DX E11.65 100 each 6  . hydrochlorothiazide (HYDRODIURIL) 25 MG  tablet Take 1 tablet (25 mg total) by mouth daily. 90 tablet 3  . insulin aspart protamine- aspart (NOVOLOG MIX 70/30) (70-30) 100 UNIT/ML injection INJECT 80 UNITS UNDER THE SKIN IN THE MORNING AND 50 UNITS IN THE EVENING WITH MEALS (Patient taking differently: Inject 50-80 Units into the skin See admin instructions. INJECT 80 UNITS UNDER THE SKIN IN THE MORNING AND 50 UNITS IN THE EVENING WITH MEALS) 30 mL 6  . INSULIN SYRINGE 1CC/29G 29G X 1/2" 1 ML MISC USE THREE TIMES DAILY AS DIRECTED 100 each 3  . losartan (COZAAR) 50 MG tablet Take 1 tablet (50 mg total) by mouth daily. 90 tablet 3  . metFORMIN (GLUCOPHAGE) 500 MG tablet Take 0.5 tablets (250 mg total) by mouth daily with breakfast. 30 tablet 3  . mometasone-formoterol (DULERA) 100-5 MCG/ACT AERO Inhale 2 puffs into the lungs 2 (two) times daily. 13 g 2  . montelukast (SINGULAIR) 10 MG tablet Take  1 tablet (10 mg total) by mouth at bedtime. 90 tablet 1  . Multiple Vitamin (MULTIVITAMIN WITH MINERALS) TABS tablet Take 1 tablet by mouth daily.    . nitroGLYCERIN (NITROSTAT) 0.3 MG SL tablet Place 1 tablet (0.3 mg total) under the tongue every 5 (five) minutes as needed for chest pain. Max 3 tablets in 15 minutes 30 tablet 1  . olopatadine (PATANOL) 0.1 % ophthalmic solution Place 1 drop into both eyes 2 (two) times daily. 5 mL 0  . OneTouch Delica Lancets 21J MISC . Use as directed to test blood sugar three times daily DX E11.65 100 each 6  . pantoprazole (PROTONIX) 40 MG tablet Take 1 tablet (40 mg total) by mouth daily. Must have office visit for refills 30 tablet 6  . Potassium Chloride ER 20 MEQ TBCR Take 1 tablet by mouth daily. 30 tablet 5  . rizatriptan (MAXALT) 10 MG tablet Take 1 tablet (10 mg total) by mouth as needed for migraine. May repeat in 2 hours if needed. MAX 2 TABLETS PER 24 HOURS. (Patient not taking: Reported on 11/13/2019) 10 tablet 2  . topiramate (TOPAMAX) 25 MG capsule Take 3 capsules (75 mg total) by mouth 2 (two) times  daily. 180 capsule 3  . traMADol (ULTRAM) 50 MG tablet Take 1 tablet (50 mg total) by mouth every 8 (eight) hours as needed. (Patient taking differently: Take 50 mg by mouth every 8 (eight) hours as needed for moderate pain. ) 30 tablet 0   No current facility-administered medications on file prior to visit.    Allergies  Allergen Reactions  . Januvia [Sitagliptin]     Chest pains    Social History   Socioeconomic History  . Marital status: Legally Separated    Spouse name: Not on file  . Number of children: 4  . Years of education: Not on file  . Highest education level: Not on file  Occupational History  . Occupation: Disabled  Tobacco Use  . Smoking status: Never Smoker  . Smokeless tobacco: Never Used  Substance and Sexual Activity  . Alcohol use: No  . Drug use: No  . Sexual activity: Not on file  Other Topics Concern  . Not on file  Social History Narrative   Lives with daughter   Caffeine use: 1 cup coffee per day   Social Determinants of Health   Financial Resource Strain:   . Difficulty of Paying Living Expenses:   Food Insecurity:   . Worried About Charity fundraiser in the Last Year:   . Arboriculturist in the Last Year:   Transportation Needs:   . Film/video editor (Medical):   Marland Kitchen Lack of Transportation (Non-Medical):   Physical Activity:   . Days of Exercise per Week:   . Minutes of Exercise per Session:   Stress:   . Feeling of Stress :   Social Connections:   . Frequency of Communication with Friends and Family:   . Frequency of Social Gatherings with Friends and Family:   . Attends Religious Services:   . Active Member of Clubs or Organizations:   . Attends Archivist Meetings:   Marland Kitchen Marital Status:   Intimate Partner Violence:   . Fear of Current or Ex-Partner:   . Emotionally Abused:   Marland Kitchen Physically Abused:   . Sexually Abused:     Family History  Problem Relation Age of Onset  . Colon cancer Father   . Diabetes type II  Sister   . Diabetes type II Brother   . Colon cancer Sister   . Migraines Neg Hx   . Breast cancer Neg Hx     Past Surgical History:  Procedure Laterality Date  . ABDOMINAL HYSTERECTOMY    . CATARACT EXTRACTION Left   . CATARACT EXTRACTION     LT 01/2017, 02/2017 on RT  . CESAREAN SECTION      ROS: Review of Systems Negative except as stated above  PHYSICAL EXAM: BP 133/72   Pulse 93   Temp (!) 96.1 F (35.6 C)   Resp 16   Wt 238 lb 9.6 oz (108.2 kg)   SpO2 96%   BMI 42.27 kg/m   Wt Readings from Last 3 Encounters:  11/13/19 238 lb 9.6 oz (108.2 kg)  08/26/19 235 lb (106.6 kg)  08/22/18 237 lb 9.6 oz (107.8 kg)  Pulse ox room air 84%. Pulse ox with ambulation 84 to 87% room air. Pulse ox 96% on 2 L of oxygen provided by [place].  Physical Exam  General appearance - alert, well appearing, and in no distress. She does not have oxygen with her. Mental status - normal mood, behavior, speech, dress, motor activity, and thought processes Neck - supple, no significant adenopathy Chest -breath sounds are decreased but no wheezes or crackles heard. \ Heart - normal rate, regular rhythm, normal S1, S2, no murmurs, rubs, clicks or gallops Extremities -1+ edema in both feet.  Lab Results  Component Value Date   HGBA1C 9.5 (H) 06/14/2019    CMP Latest Ref Rng & Units 08/26/2019 08/25/2019 08/24/2019  Glucose 70 - 99 mg/dL 422(H) 166(H) 170(H)  BUN 8 - 23 mg/dL 16 8 10   Creatinine 0.44 - 1.00 mg/dL 1.40(H) 1.07(H) 1.12(H)  Sodium 135 - 145 mmol/L 138 140 139  Potassium 3.5 - 5.1 mmol/L 5.0 3.1(L) 3.7  Chloride 98 - 111 mmol/L 98 97(L) 99  CO2 22 - 32 mmol/L 32 31 30  Calcium 8.9 - 10.3 mg/dL 9.1 9.0 9.4  Total Protein 6.5 - 8.1 g/dL - 6.5 -  Total Bilirubin 0.3 - 1.2 mg/dL - 0.8 -  Alkaline Phos 38 - 126 U/L - 101 -  AST 15 - 41 U/L - 23 -  ALT 0 - 44 U/L - 25 -   Lipid Panel     Component Value Date/Time   CHOL 118 06/14/2019 1128   TRIG 148 06/14/2019 1128   HDL 56  06/14/2019 1128   CHOLHDL 2.1 06/14/2019 1128   CHOLHDL 2.1 08/11/2016 1109   VLDL 28 08/11/2016 1109   LDLCALC 37 06/14/2019 1128    CBC    Component Value Date/Time   WBC 7.1 08/25/2019 0409   RBC 5.23 (H) 08/25/2019 0409   HGB 14.0 08/25/2019 0409   HGB 13.2 08/23/2017 1655   HCT 46.3 (H) 08/25/2019 0409   HCT 38.8 08/23/2017 1655   PLT 185 08/25/2019 0409   MCV 88.5 08/25/2019 0409   MCV 83 08/23/2017 1655   MCH 26.8 08/25/2019 0409   MCHC 30.2 08/25/2019 0409   RDW 15.0 08/25/2019 0409   RDW 15.0 08/23/2017 1655   LYMPHSABS 2.8 08/25/2019 0409   LYMPHSABS 4.3 (H) 08/23/2017 1655   MONOABS 0.5 08/25/2019 0409   EOSABS 0.3 08/25/2019 0409   EOSABS 0.4 08/23/2017 1655   BASOSABS 0.1 08/25/2019 0409   BASOSABS 0.1 08/23/2017 1655     ASSESSMENT AND PLAN: 1. 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, with  long-term current use of insulin, macular edema presence unspecified, unspecified retinopathy severity (HCC) Blood sugars are not bad. We will not make any change in the dose of her insulin. Encouraged her to continue trying to eat healthy. Check A1c today. - POCT glucose (manual entry) - Hemoglobin A1c - insulin aspart protamine- aspart (NOVOLOG MIX 70/30) (70-30) 100 UNIT/ML injection; INJECT 80 UNITS UNDER THE SKIN IN THE MORNING AND 50 UNITS IN THE EVENING WITH MEALS  Dispense: 60 mL; Refill: 6 - Comprehensive metabolic panel  2. Chronic respiratory failure with hypoxia, on home oxygen therapy [facility]) Patient with significant hypoxia. Advised her that she needs to use the oxygen at rest and with exertion. I will resubmit referral for her to see the pulmonologist to help clarify the situation. Hopefully they can get her in as soon as possible. Consider CT angiogram to check for PE his kidney function is stable - budesonide-formoterol (SYMBICORT) 80-4.5 MCG/ACT inhaler; Inhale 2 puffs into the lungs 2 (two) times daily.  Dispense: 1 Inhaler; Refill:  3  3. Essential hypertension Close to goal. Continue current medications and low-salt diet  4. Morbid obesity due to excess calories ([place]) See #1 above  5. Cervical myelopathy (HCC) Gave a limited refill on tramadol - traMADol (ULTRAM) 50 MG tablet; Take 1 tablet (50 mg total) by mouth every 8 (eight) hours as needed.  Dispense: 30 tablet; Refill: 0  6. Chronic migraine Requested refill on Topamax - topiramate (TOPAMAX) 25 MG capsule; Take 3 capsules (75 mg total) by mouth 2 (two) times daily.  Dispense: 180 capsule; Refill: 3  8. Need for vaccination against Streptococcus pneumoniae using pneumococcal conjugate vaccine  Pneumovax given Encouraged and advised patient to get the COVID-19 vaccine but to wait 1 month since she was given the Pneumovax today.     Patient was given the opportunity to ask questions.  Patient verbalized understanding of the plan and was able to repeat key elements of the plan.   No orders of the defined types were placed in this encounter.    Requested Prescriptions    No prescriptions requested or ordered in this encounter    No follow-ups on file.  Karle Plumber, MD, FACP

## 2019-11-13 NOTE — Progress Notes (Signed)
Patient presents for vaccination against strep pneumo per orders of Dr. Johnson. Consent given. Counseling provided. No contraindications exists. Vaccine administered without incident.  ° °Luke Van Ausdall, PharmD, CPP °Clinical Pharmacist °[facility] °[phone] ° °

## 2019-11-14 LAB — COMPREHENSIVE METABOLIC PANEL
ALT: 27 IU/L (ref 0–32)
AST: 25 IU/L (ref 0–40)
Albumin/Globulin Ratio: 1.6 (ref 1.2–2.2)
Albumin: 4.4 g/dL (ref 3.8–4.8)
Alkaline Phosphatase: 122 IU/L — ABNORMAL HIGH (ref 39–117)
BUN/Creatinine Ratio: 12 (ref 12–28)
BUN: 14 mg/dL (ref 8–27)
Bilirubin Total: 0.2 mg/dL (ref 0.0–1.2)
CO2: 28 mmol/L (ref 20–29)
Calcium: 9.9 mg/dL (ref 8.7–10.3)
Chloride: 105 mmol/L (ref 96–106)
Creatinine, Ser: 1.14 mg/dL — ABNORMAL HIGH (ref 0.57–1.00)
GFR calc Af Amer: 57 mL/min/{1.73_m2} — ABNORMAL LOW (ref >59)
GFR calc non Af Amer: 50 mL/min/{1.73_m2} — ABNORMAL LOW (ref >59)
Globulin, Total: 2.8 g/dL (ref 1.5–4.5)
Glucose: 82 mg/dL (ref 65–99)
Potassium: 4.6 mmol/L (ref 3.5–5.2)
Sodium: 146 mmol/L — ABNORMAL HIGH (ref 134–144)
Total Protein: 7.2 g/dL (ref 6.0–8.5)

## 2019-11-14 LAB — HEMOGLOBIN A1C
Est. average glucose Bld gHb Est-mCnc: 255 mg/dL
Hgb A1c MFr Bld: 10.5 % — ABNORMAL HIGH (ref 4.8–5.6)

## 2019-11-15 ENCOUNTER — Telehealth: Payer: Self-pay | Admitting: Internal Medicine

## 2019-11-15 DIAGNOSIS — R0902 Hypoxemia: Secondary | ICD-10-CM

## 2019-11-15 DIAGNOSIS — R0602 Shortness of breath: Secondary | ICD-10-CM

## 2019-11-15 MED ORDER — PREDNISONE 10 MG PO TABS: 10.0000 mg | ORAL_TABLET | Freq: Every day | ORAL | 0 refills | Status: DC

## 2019-11-15 MED ORDER — FARXIGA 5 MG PO TABS: 5.0000 mg | ORAL_TABLET | Freq: Every day | ORAL | 4 refills | Status: DC

## 2019-11-15 NOTE — Telephone Encounter (Signed)
Phone call placed to patient today discuss lab results.  Patient informed that her A1c is 10.5 meaning that her diabetes is not controlled.  She reports compliance with taking her insulin.  She is not taking the low-dose Metformin because it upsets her stomach.  I recommend adding [place].  Advised to avoid dehydration while on this medicine.  Advised to drink adequate fluids during the day.  Prescription sent to her pharmacy.  Patient advised that her kidney function is not 100% but improved compared to what it was back in February.  We can move forward with ordering the CT angiogram of the chest to rule out PE.  Patient reports that she feels increased phlegm and think she would benefit from a few days of prednisone which she states has helped her breathing in the past.  She also inquired about the pulmonary referral which has been placed already.  I will send prescription to the pharmacy for several days of prednisone.

## 2019-11-15 NOTE — Progress Notes (Deleted)
Patient referred by Ladell Pier, MD for ***  Subjective:   Heidi [name], female    DOB: 03/14/1951, 69 y.o.   MRN: [id number]  *** No chief complaint on file.   *** HPI  69 y.o. *** female with ***  *** Past Medical History:  Diagnosis Date  . Adenomatous colon polyp   . Anemia   . Anxiety   . Asthma   . CHF (congestive heart failure) ([place])   . Diabetes mellitus without complication ([place])   . High cholesterol   . Hypertension   . Left knee injury   . Pneumonia     *** Past Surgical History:  Procedure Laterality Date  . ABDOMINAL HYSTERECTOMY    . CATARACT EXTRACTION Left   . CATARACT EXTRACTION     LT 01/2017, 02/2017 on RT  . CESAREAN SECTION      *** Social History   Tobacco Use  Smoking Status Never Smoker  Smokeless Tobacco Never Used    Social History   Substance and Sexual Activity  Alcohol Use No    *** Family History  Problem Relation Age of Onset  . Colon cancer Father   . Diabetes type II Sister   . Diabetes type II Brother   . Colon cancer Sister   . Migraines Neg Hx   . Breast cancer Neg Hx     *** Current Outpatient Medications on File Prior to Visit  Medication Sig Dispense Refill  . acetic acid 2 % otic solution Place 4 drops into both ears every 6 (six) hours as needed. 15 mL 0  . albuterol (PROVENTIL) (2.5 MG/3ML) 0.083% nebulizer solution USE 1 VIAL VIA NEBULIZER EVERY 6 HOURS AS NEEDED FOR WHEEZING OR SHORTNESS OF BREATH (Patient taking differently: Take 2.5 mg by nebulization every 6 (six) hours as needed for wheezing or shortness of breath. ) 25 vial 0  . albuterol (VENTOLIN HFA) 108 (90 Base) MCG/ACT inhaler Inhale 1-2 puffs into the lungs every 6 (six) hours as needed for wheezing or shortness of breath. 8 g 0  . aspirin 81 MG chewable tablet Chew 81 mg by mouth daily.     Marland Kitchen atenolol (TENORMIN) 25 MG tablet Take 1 tablet (25 mg total) by mouth daily. 90 tablet 3  . atorvastatin (LIPITOR) 40 MG tablet TAKE 1  TABLET BY MOUTH DAILY AT 6 PM (Patient taking differently: Take 40 mg by mouth daily. ) 30 tablet 11  . Blood Glucose Monitoring Suppl (ONETOUCH VERIO FLEX SYSTEM) w/Device KIT Use as directed to test blood sugar three times daily DX E11.65 1 kit 0  . budesonide-formoterol (SYMBICORT) 80-4.5 MCG/ACT inhaler Inhale 2 puffs into the lungs 2 (two) times daily. 1 Inhaler 3  . CALCIUM 500/D 500-200 MG-UNIT tablet TAKE 1 TABLET BY MOUTH DAILY (Patient not taking: Reported on 11/13/2019) 30 tablet 2  . cetirizine (ZYRTEC) 10 MG tablet Take 1 tablet (10 mg total) by mouth daily. 30 tablet 0  . dapagliflozin propanediol (FARXIGA) 5 MG TABS tablet Take 5 mg by mouth daily before breakfast. 30 tablet 4  . famotidine (PEPCID) 20 MG tablet TAKE 1 TABLET(20 MG) BY MOUTH AT BEDTIME (Patient taking differently: Take 20 mg by mouth at bedtime. ) 90 tablet 2  . glucose blood (ONETOUCH VERIO) test strip Use as directed to test blood sugar three times daily DX E11.65 100 each 6  . hydrochlorothiazide (HYDRODIURIL) 25 MG tablet Take 1 tablet (25 mg total) by mouth daily. 90 tablet 3  .  insulin aspart protamine- aspart (NOVOLOG MIX 70/30) (70-30) 100 UNIT/ML injection INJECT 80 UNITS UNDER THE SKIN IN THE MORNING AND 50 UNITS IN THE EVENING WITH MEALS 60 mL 6  . INSULIN SYRINGE 1CC/29G 29G X 1/2" 1 ML MISC USE THREE TIMES DAILY AS DIRECTED 100 each 3  . losartan (COZAAR) 50 MG tablet Take 1 tablet (50 mg total) by mouth daily. 90 tablet 3  . metFORMIN (GLUCOPHAGE) 500 MG tablet Take 0.5 tablets (250 mg total) by mouth daily with breakfast. 30 tablet 3  . mometasone-formoterol (DULERA) 100-5 MCG/ACT AERO Inhale 2 puffs into the lungs 2 (two) times daily. 13 g 2  . montelukast (SINGULAIR) 10 MG tablet Take 1 tablet (10 mg total) by mouth at bedtime. 90 tablet 1  . Multiple Vitamin (MULTIVITAMIN WITH MINERALS) TABS tablet Take 1 tablet by mouth daily.    . nitroGLYCERIN (NITROSTAT) 0.3 MG SL tablet Place 1 tablet (0.3 mg  total) under the tongue every 5 (five) minutes as needed for chest pain. Max 3 tablets in 15 minutes 30 tablet 1  . olopatadine (PATANOL) 0.1 % ophthalmic solution Place 1 drop into both eyes 2 (two) times daily. 5 mL 0  . OneTouch Delica Lancets 31V MISC . Use as directed to test blood sugar three times daily DX E11.65 100 each 6  . pantoprazole (PROTONIX) 40 MG tablet Take 1 tablet (40 mg total) by mouth daily. Must have office visit for refills 30 tablet 6  . Potassium Chloride ER 20 MEQ TBCR Take 1 tablet by mouth daily. 30 tablet 5  . predniSONE (DELTASONE) 10 MG tablet Take 1 tablet (10 mg total) by mouth daily with breakfast. 5 tablet 0  . rizatriptan (MAXALT) 10 MG tablet Take 1 tablet (10 mg total) by mouth as needed for migraine. May repeat in 2 hours if needed. MAX 2 TABLETS PER 24 HOURS. (Patient not taking: Reported on 11/13/2019) 10 tablet 2  . topiramate (TOPAMAX) 25 MG capsule Take 3 capsules (75 mg total) by mouth 2 (two) times daily. 180 capsule 3  . traMADol (ULTRAM) 50 MG tablet Take 1 tablet (50 mg total) by mouth every 8 (eight) hours as needed. 30 tablet 0   No current facility-administered medications on file prior to visit.    Cardiovascular and other pertinent studies:  *** EKG ***/***/202***: ***  *** Recent labs: ***/***/202***: Glucose ***, BUN/Cr ***/***. EGFR ***. Na/K ***/***. ***Rest of the CMP normal H/H ***/***. MCV ***. Platelets *** ***HbA1C ***% Chol ***, TG ***, HDL ***, LDL *** ***TSH ***normal   *** ROS      *** There were no vitals filed for this visit.   There is no height or weight on file to calculate BMI. There were no vitals filed for this visit.  *** Objective:   Physical Exam    ***     Assessment & Recommendations:   ***  ***     Thank you for referring the patient to [place]. Please feel free to contact with any questions.  Nigel Mormon, MD [facility] Pager: [phone] Office:  [phone]

## 2019-11-16 ENCOUNTER — Telehealth: Payer: Self-pay

## 2019-11-16 NOTE — Telephone Encounter (Signed)
Call to pt to verify if she is on oxygen at home, lm on vm for pt to call back to clarify with [place], pt is sched for previsit on 11/23/19 in room 52 and colonoscopy at [facility] on 12/07/19 with JP

## 2019-11-18 NOTE — Progress Notes (Signed)
Patient referred by Ladell Pier, MD for chest pain  Subjective:   Heidi [name], female    DOB: 09-04-1950, 69 y.o.   MRN: [id number]   Chief Complaint  Patient presents with  . Chest Pain  . Shortness of Breath  . New Patient (Initial Visit)    HPI  69 y.o.  African American female with hypertension, uncontrolled type 2 DM with retinopathy, hyperlipidemia, CKD stage III, OSA, obesity, referred for chest pain and shortness of breath.   Patient was hospitalized in 08/2019 with chest pain and shortness of breath. Cardiac workup with EKG, HS trop, BNP unremarkable. She was placed on home oxygen. She is a lifetime nonsmoker, but has reportedly had hypoxia in the past. She sees Dr. Gayla Medicus, who had mentioned diagnosis of pseudoasthma, and upper airway cough syndrome.  Patient reports having retrosternal chest pain, only when she does not use oxygen. While using oxygen, she has occasional retrosternal chest pain on walking.   Patient underwent cardiac catheterization at [facility] in [place] in twenty fourteen, which showed normal coronaries and elevated LVEDP at 20 mmHg. She had a CT scan of her chest in twenty sixteen that showed scattered coronary artery calcifications.  Echocardiogram in February 2021 showed normal EF, indeterminate diastolic function, mild right ventricular enlargement, no significant valvular abnormality.  There was inadequate tricuspid regurgitation signal to assess PA pressure.  Independently reviewed data from hospitalization in 08/2019, prior cardiac workup from [facility] clinic available in care everywhere.   Past Medical History:  Diagnosis Date  . Adenomatous colon polyp   . Anemia   . Anxiety   . Asthma   . CHF (congestive heart failure) ([place])   . Diabetes mellitus without complication ([place])   . High cholesterol   . Hypertension   . Left knee injury   . Pneumonia      Past Surgical History:  Procedure Laterality Date  .  ABDOMINAL HYSTERECTOMY    . CATARACT EXTRACTION Left   . CATARACT EXTRACTION     LT 01/2017, 02/2017 on RT  . CESAREAN SECTION       Social History   Tobacco Use  Smoking Status Never Smoker  Smokeless Tobacco Never Used    Social History   Substance and Sexual Activity  Alcohol Use No     Family History  Problem Relation Age of Onset  . Colon cancer Father   . Diabetes type II Sister   . Diabetes type II Brother   . Colon cancer Sister   . Migraines Neg Hx   . Breast cancer Neg Hx      Current Outpatient Medications on File Prior to Visit  Medication Sig Dispense Refill  . acetic acid 2 % otic solution Place 4 drops into both ears every 6 (six) hours as needed. 15 mL 0  . albuterol (PROVENTIL) (2.5 MG/3ML) 0.083% nebulizer solution USE 1 VIAL VIA NEBULIZER EVERY 6 HOURS AS NEEDED FOR WHEEZING OR SHORTNESS OF BREATH (Patient taking differently: Take 2.5 mg by nebulization every 6 (six) hours as needed for wheezing or shortness of breath. ) 25 vial 0  . albuterol (VENTOLIN HFA) 108 (90 Base) MCG/ACT inhaler Inhale 1-2 puffs into the lungs every 6 (six) hours as needed for wheezing or shortness of breath. 8 g 0  . aspirin 81 MG chewable tablet Chew 81 mg by mouth daily.     Marland Kitchen atenolol (TENORMIN) 25 MG tablet Take 1 tablet (25 mg total) by mouth daily. [place]  tablet 3  . atorvastatin (LIPITOR) 40 MG tablet TAKE 1 TABLET BY MOUTH DAILY AT 6 PM (Patient taking differently: Take 40 mg by mouth daily. ) 30 tablet 11  . Blood Glucose Monitoring Suppl (ONETOUCH VERIO FLEX SYSTEM) w/Device KIT Use as directed to test blood sugar three times daily DX E11.65 1 kit 0  . budesonide-formoterol (SYMBICORT) 80-4.5 MCG/ACT inhaler Inhale 2 puffs into the lungs 2 (two) times daily. 1 Inhaler 3  . CALCIUM 500/D 500-200 MG-UNIT tablet TAKE 1 TABLET BY MOUTH DAILY (Patient not taking: Reported on 11/13/2019) 30 tablet 2  . cetirizine (ZYRTEC) 10 MG tablet Take 1 tablet (10 mg total) by mouth daily. 30  tablet 0  . dapagliflozin propanediol (FARXIGA) 5 MG TABS tablet Take 5 mg by mouth daily before breakfast. 30 tablet 4  . famotidine (PEPCID) 20 MG tablet TAKE 1 TABLET(20 MG) BY MOUTH AT BEDTIME (Patient taking differently: Take 20 mg by mouth at bedtime. ) 90 tablet 2  . glucose blood (ONETOUCH VERIO) test strip Use as directed to test blood sugar three times daily DX E11.65 100 each 6  . hydrochlorothiazide (HYDRODIURIL) 25 MG tablet Take 1 tablet (25 mg total) by mouth daily. 90 tablet 3  . insulin aspart protamine- aspart (NOVOLOG MIX 70/30) (70-30) 100 UNIT/ML injection INJECT 80 UNITS UNDER THE SKIN IN THE MORNING AND 50 UNITS IN THE EVENING WITH MEALS 60 mL 6  . INSULIN SYRINGE 1CC/29G 29G X 1/2" 1 ML MISC USE THREE TIMES DAILY AS DIRECTED 100 each 3  . losartan (COZAAR) 50 MG tablet Take 1 tablet (50 mg total) by mouth daily. 90 tablet 3  . metFORMIN (GLUCOPHAGE) 500 MG tablet Take 0.5 tablets (250 mg total) by mouth daily with breakfast. 30 tablet 3  . mometasone-formoterol (DULERA) 100-5 MCG/ACT AERO Inhale 2 puffs into the lungs 2 (two) times daily. 13 g 2  . montelukast (SINGULAIR) 10 MG tablet Take 1 tablet (10 mg total) by mouth at bedtime. 90 tablet 1  . Multiple Vitamin (MULTIVITAMIN WITH MINERALS) TABS tablet Take 1 tablet by mouth daily.    . nitroGLYCERIN (NITROSTAT) 0.3 MG SL tablet Place 1 tablet (0.3 mg total) under the tongue every 5 (five) minutes as needed for chest pain. Max 3 tablets in 15 minutes 30 tablet 1  . olopatadine (PATANOL) 0.1 % ophthalmic solution Place 1 drop into both eyes 2 (two) times daily. 5 mL 0  . OneTouch Delica Lancets 40J MISC . Use as directed to test blood sugar three times daily DX E11.65 100 each 6  . pantoprazole (PROTONIX) 40 MG tablet Take 1 tablet (40 mg total) by mouth daily. Must have office visit for refills 30 tablet 6  . Potassium Chloride ER 20 MEQ TBCR Take 1 tablet by mouth daily. 30 tablet 5  . predniSONE (DELTASONE) 10 MG tablet  Take 1 tablet (10 mg total) by mouth daily with breakfast. 5 tablet 0  . rizatriptan (MAXALT) 10 MG tablet Take 1 tablet (10 mg total) by mouth as needed for migraine. May repeat in 2 hours if needed. MAX 2 TABLETS PER 24 HOURS. (Patient not taking: Reported on 11/13/2019) 10 tablet 2  . topiramate (TOPAMAX) 25 MG capsule Take 3 capsules (75 mg total) by mouth 2 (two) times daily. 180 capsule 3  . traMADol (ULTRAM) 50 MG tablet Take 1 tablet (50 mg total) by mouth every 8 (eight) hours as needed. 30 tablet 0   No current facility-administered medications on file prior  to visit.    Cardiovascular and other pertinent studies:  EKG 11/22/2019: Sinus rhythm 80 bpm.  Poor R-wave progression. Cannot exclude old inferior infarct.   Echocardiogram 08/25/2019: 1. Left ventricular ejection fraction, by estimation, is 65 to 70%. The left ventricle has normal function.  LV endocardial border not optimally defined to evaluate regional wall motion. There is mild concentric left ventricular hypertrophy.  Indeterminate diastolic filling due to E-A fusion.  2. Right ventricular systolic function is normal. The right ventricular size is mildly enlarged.  Tricuspid regurgitation signal is inadequate for assessing PA pressure.  3. The mitral valve is grossly normal. No evidence of mitral valve regurgitation. No evidence of mitral stenosis.  4. The aortic valve is grossly normal. Aortic valve regurgitation is not visualized. No aortic stenosis is present.   CT Chest 04/09/2015: 1. Mild airspace opacity within the left lower lung lobe may reflect atelectasis or possibly mild residual pneumonia.  Minimal atelectasis at the medial aspect of the right middle lobe. Lungs otherwiseclear. 2. Scattered coronary artery calcification seen. 3. Left renal cyst, with mild associated peripheral calcification.  Cardiac catheterization 2014 [facility], [place]): 1. Angiographically normal coronaries 2. LVEDP 20 mm Hg  (mildly increased)   Recent labs: 08/25/2019-11/13/2019: Glucose 82, BUN/Cr 14/1.14. EGFR 57. Na/K 146/4.6. Alkaline Phospatase 122. H/H 14/46.3. MCV 88.5. Platelets 185. HbA1C 10.5% TSH 1.977 normal BNP 23.  HS Trop 3 X2  06/14/2019: Chol 118, TG 148, HDL 56, LDL 37     Review of Systems  Cardiovascular: Positive for chest pain and dyspnea on exertion. Negative for leg swelling, palpitations and syncope.         Vitals:   11/22/19 1000  BP: 126/70  Pulse: 79  SpO2: 97%     Body mass index is 42.51 kg/m. Filed Weights   11/22/19 1000  Weight: 240 lb (108.9 kg)     Objective:   Physical Exam  Constitutional: No distress.  On supplemental oxygen  Neck: No JVD present.  Cardiovascular: Normal rate, regular rhythm, normal heart sounds and intact distal pulses.  No murmur heard. Pulmonary/Chest: Effort normal and breath sounds normal. She has no wheezes. She has no rales.  Musculoskeletal:        General: Edema (Edema on bilateral feet) present.  Nursing note and vitals reviewed.       Assessment & Recommendations:   69 y.o. African American female with hypertension, type 2 DM with retinopathy, hyperlipidemia, CKD stage III, OSA, obesity, referred for chest pain and shortness of breath.    Unclear as to what primary pulmonary pathology she has.  She is reportedly oxygen dependent.  Her chest pain seems to only occur when she does not use supplemental oxygen.  With risk factors including uncontrolled type 2 diabetes mellitus, age, and hypertension, and coronary calcification seen on CT scan in twenty sixteen, recommend nuclear stress test for stratification.  Due to her oxygen dependency and shortness of breath, she is unable to perform treadmill exercise for the stress test.  Thus will obtain pharmacological stress test.  Continue aspirin, statin and diabetes management as per PCP.  I will see her on as-needed basis, unless any significant abnormalities found on  stress test.  Recommend continued follow-up with pulmonologist Dr. Shyrl Numbers.   Thank you for referring the patient to [place]. Please feel free to contact with any questions.  Nigel Mormon, MD [facility] [place] Pager: [phone] Office: [phone]

## 2019-11-20 NOTE — Telephone Encounter (Signed)
Spoke with the patient- she confirmed that she is on home Oxygen and is having a stress test on heart. Made her an OV to see Dr.Perry (pt did not want to see PA or NP). Cancelled PV and colonoscopy at this time-pt is aware and verbalizes understanding.

## 2019-11-22 ENCOUNTER — Ambulatory Visit: Payer: Medicare HMO | Admitting: Cardiology

## 2019-11-22 ENCOUNTER — Other Ambulatory Visit: Payer: Self-pay

## 2019-11-22 ENCOUNTER — Encounter: Payer: Self-pay | Admitting: Cardiology

## 2019-11-22 VITALS — BP 126/70 | HR 79 | Ht 63.0 in | Wt 240.0 lb

## 2019-11-22 DIAGNOSIS — E1165 Type 2 diabetes mellitus with hyperglycemia: Secondary | ICD-10-CM | POA: Diagnosis not present

## 2019-11-22 DIAGNOSIS — I10 Essential (primary) hypertension: Secondary | ICD-10-CM | POA: Diagnosis not present

## 2019-11-22 DIAGNOSIS — R0789 Other chest pain: Secondary | ICD-10-CM

## 2019-11-23 ENCOUNTER — Inpatient Hospital Stay (HOSPITAL_COMMUNITY)
Admission: EM | Admit: 2019-11-23 | Discharge: 2019-12-04 | DRG: 193 | Disposition: A | Discharge status: Home / Self Care | Payer: Medicare HMO | Attending: Internal Medicine | Admitting: Internal Medicine

## 2019-11-23 ENCOUNTER — Other Ambulatory Visit: Payer: Self-pay

## 2019-11-23 ENCOUNTER — Emergency Department (HOSPITAL_COMMUNITY): Payer: Medicare HMO

## 2019-11-23 ENCOUNTER — Encounter (HOSPITAL_COMMUNITY): Payer: Self-pay | Admitting: Emergency Medicine

## 2019-11-23 DIAGNOSIS — Z833 Family history of diabetes mellitus: Secondary | ICD-10-CM

## 2019-11-23 DIAGNOSIS — R079 Chest pain, unspecified: Secondary | ICD-10-CM | POA: Diagnosis not present

## 2019-11-23 DIAGNOSIS — E860 Dehydration: Secondary | ICD-10-CM | POA: Diagnosis present

## 2019-11-23 DIAGNOSIS — K219 Gastro-esophageal reflux disease without esophagitis: Secondary | ICD-10-CM | POA: Diagnosis present

## 2019-11-23 DIAGNOSIS — Z6841 Body Mass Index (BMI) 40.0 and over, adult: Secondary | ICD-10-CM | POA: Diagnosis not present

## 2019-11-23 DIAGNOSIS — Z90710 Acquired absence of both cervix and uterus: Secondary | ICD-10-CM

## 2019-11-23 DIAGNOSIS — Z79899 Other long term (current) drug therapy: Secondary | ICD-10-CM

## 2019-11-23 DIAGNOSIS — N63 Unspecified lump in unspecified breast: Secondary | ICD-10-CM | POA: Diagnosis not present

## 2019-11-23 DIAGNOSIS — Z09 Encounter for follow-up examination after completed treatment for conditions other than malignant neoplasm: Secondary | ICD-10-CM

## 2019-11-23 DIAGNOSIS — J9601 Acute respiratory failure with hypoxia: Secondary | ICD-10-CM | POA: Diagnosis present

## 2019-11-23 DIAGNOSIS — E1122 Type 2 diabetes mellitus with diabetic chronic kidney disease: Secondary | ICD-10-CM | POA: Diagnosis present

## 2019-11-23 DIAGNOSIS — E041 Nontoxic single thyroid nodule: Secondary | ICD-10-CM | POA: Diagnosis present

## 2019-11-23 DIAGNOSIS — J189 Pneumonia, unspecified organism: Secondary | ICD-10-CM | POA: Diagnosis not present

## 2019-11-23 DIAGNOSIS — R0602 Shortness of breath: Secondary | ICD-10-CM | POA: Diagnosis not present

## 2019-11-23 DIAGNOSIS — Z800 Family history of malignant neoplasm of digestive organs: Secondary | ICD-10-CM

## 2019-11-23 DIAGNOSIS — E11649 Type 2 diabetes mellitus with hypoglycemia without coma: Secondary | ICD-10-CM | POA: Diagnosis not present

## 2019-11-23 DIAGNOSIS — N179 Acute kidney failure, unspecified: Secondary | ICD-10-CM | POA: Diagnosis present

## 2019-11-23 DIAGNOSIS — Z794 Long term (current) use of insulin: Secondary | ICD-10-CM | POA: Diagnosis not present

## 2019-11-23 DIAGNOSIS — I130 Hypertensive heart and chronic kidney disease with heart failure and stage 1 through stage 4 chronic kidney disease, or unspecified chronic kidney disease: Secondary | ICD-10-CM | POA: Diagnosis present

## 2019-11-23 DIAGNOSIS — E7800 Pure hypercholesterolemia, unspecified: Secondary | ICD-10-CM | POA: Diagnosis present

## 2019-11-23 DIAGNOSIS — E785 Hyperlipidemia, unspecified: Secondary | ICD-10-CM | POA: Diagnosis present

## 2019-11-23 DIAGNOSIS — E119 Type 2 diabetes mellitus without complications: Secondary | ICD-10-CM | POA: Diagnosis not present

## 2019-11-23 DIAGNOSIS — I5031 Acute diastolic (congestive) heart failure: Secondary | ICD-10-CM | POA: Diagnosis not present

## 2019-11-23 DIAGNOSIS — I509 Heart failure, unspecified: Secondary | ICD-10-CM | POA: Diagnosis not present

## 2019-11-23 DIAGNOSIS — Z7982 Long term (current) use of aspirin: Secondary | ICD-10-CM | POA: Diagnosis not present

## 2019-11-23 DIAGNOSIS — R42 Dizziness and giddiness: Secondary | ICD-10-CM | POA: Diagnosis not present

## 2019-11-23 DIAGNOSIS — Z86010 Personal history of colonic polyps: Secondary | ICD-10-CM

## 2019-11-23 DIAGNOSIS — R0789 Other chest pain: Secondary | ICD-10-CM | POA: Diagnosis present

## 2019-11-23 DIAGNOSIS — N1831 Chronic kidney disease, stage 3a: Secondary | ICD-10-CM | POA: Diagnosis present

## 2019-11-23 DIAGNOSIS — Z9981 Dependence on supplemental oxygen: Secondary | ICD-10-CM

## 2019-11-23 DIAGNOSIS — J45909 Unspecified asthma, uncomplicated: Secondary | ICD-10-CM | POA: Diagnosis present

## 2019-11-23 DIAGNOSIS — R110 Nausea: Secondary | ICD-10-CM | POA: Diagnosis not present

## 2019-11-23 DIAGNOSIS — I5033 Acute on chronic diastolic (congestive) heart failure: Secondary | ICD-10-CM | POA: Diagnosis present

## 2019-11-23 DIAGNOSIS — J9600 Acute respiratory failure, unspecified whether with hypoxia or hypercapnia: Secondary | ICD-10-CM

## 2019-11-23 DIAGNOSIS — T380X5A Adverse effect of glucocorticoids and synthetic analogues, initial encounter: Secondary | ICD-10-CM | POA: Diagnosis present

## 2019-11-23 DIAGNOSIS — E1165 Type 2 diabetes mellitus with hyperglycemia: Secondary | ICD-10-CM | POA: Diagnosis present

## 2019-11-23 DIAGNOSIS — Z20822 Contact with and (suspected) exposure to covid-19: Secondary | ICD-10-CM | POA: Diagnosis present

## 2019-11-23 DIAGNOSIS — E11319 Type 2 diabetes mellitus with unspecified diabetic retinopathy without macular edema: Secondary | ICD-10-CM

## 2019-11-23 LAB — CBG MONITORING, ED
Glucose-Capillary: 137 mg/dL — ABNORMAL HIGH (ref 70–99)
Glucose-Capillary: 156 mg/dL — ABNORMAL HIGH (ref 70–99)

## 2019-11-23 LAB — TROPONIN I (HIGH SENSITIVITY)
Troponin I (High Sensitivity): 4 ng/L (ref <18)
Troponin I (High Sensitivity): 4 ng/L (ref <18)

## 2019-11-23 LAB — BASIC METABOLIC PANEL
Anion gap: 12 (ref 5–15)
BUN: 10 mg/dL (ref 8–23)
CO2: 34 mmol/L — ABNORMAL HIGH (ref 22–32)
Calcium: 9.2 mg/dL (ref 8.9–10.3)
Chloride: 97 mmol/L — ABNORMAL LOW (ref 98–111)
Creatinine, Ser: 1 mg/dL (ref 0.44–1.00)
GFR calc Af Amer: >60 mL/min (ref >60)
GFR calc non Af Amer: 58 mL/min — ABNORMAL LOW (ref >60)
Glucose, Bld: 115 mg/dL — ABNORMAL HIGH (ref 70–99)
Potassium: 4 mmol/L (ref 3.5–5.1)
Sodium: 143 mmol/L (ref 135–145)

## 2019-11-23 LAB — CBC
HCT: 45 % (ref 36.0–46.0)
Hemoglobin: 13.4 g/dL (ref 12.0–15.0)
MCH: 27.1 pg (ref 26.0–34.0)
MCHC: 29.8 g/dL — ABNORMAL LOW (ref 30.0–36.0)
MCV: 91.1 fL (ref 80.0–100.0)
Platelets: 222 10*3/uL (ref 150–400)
RBC: 4.94 MIL/uL (ref 3.87–5.11)
RDW: 15.2 % (ref 11.5–15.5)
WBC: 7.4 10*3/uL (ref 4.0–10.5)
nRBC: 0 % (ref 0.0–0.2)

## 2019-11-23 MED ORDER — ALUM & MAG HYDROXIDE-SIMETH 200-200-20 MG/5ML PO SUSP
30.0000 mL | Freq: Once | ORAL | Status: AC
  Administered 2019-11-23: 30 mL via ORAL
  Filled 2019-11-23: qty 30

## 2019-11-23 MED ORDER — SODIUM CHLORIDE 0.9% FLUSH: 3.0000 mL | Freq: Once | INTRAVENOUS | Status: DC

## 2019-11-23 NOTE — ED Triage Notes (Signed)
Pt here from home via EMS-initially called for not feeling well. Then pt stated she has been having chest pain for one hour, then story changed to cp all day, then cp x several days and sts is scheduled for a stress test next week but doesn't know why. Pt refused ASA, has taken three of her own nitro today without improvement. Pt sts she is supposed to be on 2L O2 at all times but was not wearing any on EMS arrival.

## 2019-11-24 ENCOUNTER — Ambulatory Visit: Payer: Medicare HMO

## 2019-11-24 ENCOUNTER — Encounter (HOSPITAL_COMMUNITY): Payer: Self-pay | Admitting: Emergency Medicine

## 2019-11-24 ENCOUNTER — Emergency Department (HOSPITAL_COMMUNITY): Payer: Medicare HMO

## 2019-11-24 DIAGNOSIS — I5031 Acute diastolic (congestive) heart failure: Secondary | ICD-10-CM | POA: Diagnosis not present

## 2019-11-24 DIAGNOSIS — J9600 Acute respiratory failure, unspecified whether with hypoxia or hypercapnia: Secondary | ICD-10-CM

## 2019-11-24 DIAGNOSIS — N1831 Chronic kidney disease, stage 3a: Secondary | ICD-10-CM | POA: Diagnosis present

## 2019-11-24 DIAGNOSIS — Z794 Long term (current) use of insulin: Secondary | ICD-10-CM | POA: Diagnosis not present

## 2019-11-24 DIAGNOSIS — Z86010 Personal history of colonic polyps: Secondary | ICD-10-CM | POA: Diagnosis not present

## 2019-11-24 DIAGNOSIS — E1165 Type 2 diabetes mellitus with hyperglycemia: Secondary | ICD-10-CM | POA: Diagnosis present

## 2019-11-24 DIAGNOSIS — I130 Hypertensive heart and chronic kidney disease with heart failure and stage 1 through stage 4 chronic kidney disease, or unspecified chronic kidney disease: Secondary | ICD-10-CM | POA: Diagnosis present

## 2019-11-24 DIAGNOSIS — J189 Pneumonia, unspecified organism: Secondary | ICD-10-CM | POA: Diagnosis present

## 2019-11-24 DIAGNOSIS — J9601 Acute respiratory failure with hypoxia: Secondary | ICD-10-CM | POA: Diagnosis present

## 2019-11-24 DIAGNOSIS — I5033 Acute on chronic diastolic (congestive) heart failure: Secondary | ICD-10-CM | POA: Diagnosis present

## 2019-11-24 DIAGNOSIS — E041 Nontoxic single thyroid nodule: Secondary | ICD-10-CM | POA: Diagnosis present

## 2019-11-24 DIAGNOSIS — E7800 Pure hypercholesterolemia, unspecified: Secondary | ICD-10-CM | POA: Diagnosis present

## 2019-11-24 DIAGNOSIS — K219 Gastro-esophageal reflux disease without esophagitis: Secondary | ICD-10-CM | POA: Diagnosis present

## 2019-11-24 DIAGNOSIS — N63 Unspecified lump in unspecified breast: Secondary | ICD-10-CM

## 2019-11-24 DIAGNOSIS — Z7982 Long term (current) use of aspirin: Secondary | ICD-10-CM | POA: Diagnosis not present

## 2019-11-24 DIAGNOSIS — J188 Other pneumonia, unspecified organism: Secondary | ICD-10-CM | POA: Diagnosis present

## 2019-11-24 DIAGNOSIS — Z20822 Contact with and (suspected) exposure to covid-19: Secondary | ICD-10-CM | POA: Diagnosis present

## 2019-11-24 DIAGNOSIS — J45909 Unspecified asthma, uncomplicated: Secondary | ICD-10-CM | POA: Diagnosis present

## 2019-11-24 DIAGNOSIS — E860 Dehydration: Secondary | ICD-10-CM | POA: Diagnosis present

## 2019-11-24 DIAGNOSIS — Z90710 Acquired absence of both cervix and uterus: Secondary | ICD-10-CM | POA: Diagnosis not present

## 2019-11-24 DIAGNOSIS — T380X5A Adverse effect of glucocorticoids and synthetic analogues, initial encounter: Secondary | ICD-10-CM | POA: Diagnosis present

## 2019-11-24 DIAGNOSIS — Z833 Family history of diabetes mellitus: Secondary | ICD-10-CM | POA: Diagnosis not present

## 2019-11-24 DIAGNOSIS — E1122 Type 2 diabetes mellitus with diabetic chronic kidney disease: Secondary | ICD-10-CM | POA: Diagnosis present

## 2019-11-24 DIAGNOSIS — N179 Acute kidney failure, unspecified: Secondary | ICD-10-CM | POA: Diagnosis present

## 2019-11-24 DIAGNOSIS — Z800 Family history of malignant neoplasm of digestive organs: Secondary | ICD-10-CM | POA: Diagnosis not present

## 2019-11-24 DIAGNOSIS — Z6841 Body Mass Index (BMI) 40.0 and over, adult: Secondary | ICD-10-CM | POA: Diagnosis not present

## 2019-11-24 DIAGNOSIS — E119 Type 2 diabetes mellitus without complications: Secondary | ICD-10-CM | POA: Diagnosis not present

## 2019-11-24 DIAGNOSIS — E11649 Type 2 diabetes mellitus with hypoglycemia without coma: Secondary | ICD-10-CM | POA: Diagnosis not present

## 2019-11-24 DIAGNOSIS — E785 Hyperlipidemia, unspecified: Secondary | ICD-10-CM | POA: Diagnosis present

## 2019-11-24 LAB — GLUCOSE, CAPILLARY
Glucose-Capillary: 220 mg/dL — ABNORMAL HIGH (ref 70–99)
Glucose-Capillary: 160 mg/dL — ABNORMAL HIGH (ref 70–99)
Glucose-Capillary: 124 mg/dL — ABNORMAL HIGH (ref 70–99)

## 2019-11-24 LAB — RESPIRATORY PANEL BY PCR

## 2019-11-24 LAB — SARS CORONAVIRUS 2 BY RT PCR (HOSPITAL ORDER, PERFORMED IN ~~LOC~~ HOSPITAL LAB): SARS Coronavirus 2: NEGATIVE

## 2019-11-24 LAB — BRAIN NATRIURETIC PEPTIDE: B Natriuretic Peptide: 19.7 pg/mL (ref 0.0–100.0)

## 2019-11-24 LAB — PROCALCITONIN: Procalcitonin: <0.1 ng/mL

## 2019-11-24 LAB — C-REACTIVE PROTEIN: CRP: 1.9 mg/dL — ABNORMAL HIGH (ref <1.0)

## 2019-11-24 LAB — CBG MONITORING, ED: Glucose-Capillary: 210 mg/dL — ABNORMAL HIGH (ref 70–99)

## 2019-11-24 LAB — TSH: TSH: 0.75 u[IU]/mL (ref 0.350–4.500)

## 2019-11-24 MED ORDER — PANTOPRAZOLE SODIUM 40 MG PO TBEC
40.0000 mg | DELAYED_RELEASE_TABLET | Freq: Every day | ORAL | Status: DC
  Administered 2019-11-24 – 2019-12-04 (×11): 40 mg via ORAL
  Filled 2019-11-24 (×11): qty 1

## 2019-11-24 MED ORDER — ADULT MULTIVITAMIN W/MINERALS CH
1.0000 | ORAL_TABLET | Freq: Every day | ORAL | Status: DC
  Administered 2019-11-24 – 2019-12-04 (×11): 1 via ORAL
  Filled 2019-11-24 (×11): qty 1

## 2019-11-24 MED ORDER — INSULIN ASPART 100 UNIT/ML ~~LOC~~ SOLN
0.0000 [IU] | Freq: Three times a day (TID) | SUBCUTANEOUS | Status: DC
  Administered 2019-11-24: 3 [IU] via SUBCUTANEOUS
  Administered 2019-11-24 – 2019-11-25 (×3): 5 [IU] via SUBCUTANEOUS
  Administered 2019-11-25: 8 [IU] via SUBCUTANEOUS
  Administered 2019-11-25: 2 [IU] via SUBCUTANEOUS
  Administered 2019-11-26: 3 [IU] via SUBCUTANEOUS
  Administered 2019-11-26: 5 [IU] via SUBCUTANEOUS
  Administered 2019-11-27 (×2): 11 [IU] via SUBCUTANEOUS

## 2019-11-24 MED ORDER — MOMETASONE FURO-FORMOTEROL FUM 100-5 MCG/ACT IN AERO
2.0000 | INHALATION_SPRAY | Freq: Two times a day (BID) | RESPIRATORY_TRACT | Status: DC
  Administered 2019-11-24 – 2019-12-04 (×20): 2 via RESPIRATORY_TRACT
  Filled 2019-11-24: qty 8.8

## 2019-11-24 MED ORDER — ACETAMINOPHEN 325 MG PO TABS
650.0000 mg | ORAL_TABLET | Freq: Four times a day (QID) | ORAL | Status: DC | PRN
  Administered 2019-11-24 – 2019-11-25 (×2): 650 mg via ORAL
  Filled 2019-11-24 (×2): qty 2

## 2019-11-24 MED ORDER — IOHEXOL 350 MG/ML SOLN
75.0000 mL | Freq: Once | INTRAVENOUS | Status: AC | PRN
  Administered 2019-11-24: 75 mL via INTRAVENOUS

## 2019-11-24 MED ORDER — FEXOFENADINE HCL 180 MG PO TABS: 180.0000 mg | ORAL_TABLET | Freq: Every day | ORAL | Status: DC | PRN

## 2019-11-24 MED ORDER — ALBUTEROL SULFATE (2.5 MG/3ML) 0.083% IN NEBU
2.5000 mg | INHALATION_SOLUTION | Freq: Four times a day (QID) | RESPIRATORY_TRACT | Status: DC | PRN
  Administered 2019-11-26: 2.5 mg via RESPIRATORY_TRACT
  Filled 2019-11-24: qty 3

## 2019-11-24 MED ORDER — ENOXAPARIN SODIUM 40 MG/0.4ML ~~LOC~~ SOLN
40.0000 mg | SUBCUTANEOUS | Status: DC
  Administered 2019-11-24 – 2019-12-04 (×11): 40 mg via SUBCUTANEOUS
  Filled 2019-11-24 (×11): qty 0.4

## 2019-11-24 MED ORDER — TOPIRAMATE 25 MG PO TABS
75.0000 mg | ORAL_TABLET | Freq: Two times a day (BID) | ORAL | Status: DC
  Administered 2019-11-24 – 2019-12-04 (×21): 75 mg via ORAL
  Filled 2019-11-24 (×21): qty 3

## 2019-11-24 MED ORDER — METHYLPREDNISOLONE SODIUM SUCC 125 MG IJ SOLR: 60.0000 mg | Freq: Two times a day (BID) | INTRAMUSCULAR | Status: DC

## 2019-11-24 MED ORDER — INSULIN ASPART PROT & ASPART (70-30 MIX) 100 UNIT/ML ~~LOC~~ SUSP
80.0000 [IU] | Freq: Every day | SUBCUTANEOUS | Status: DC
  Administered 2019-11-24 – 2019-11-28 (×4): 80 [IU] via SUBCUTANEOUS
  Filled 2019-11-24 (×3): qty 10

## 2019-11-24 MED ORDER — SODIUM CHLORIDE 0.9 % IV SOLN
1.0000 g | INTRAVENOUS | Status: DC
  Administered 2019-11-25 – 2019-11-29 (×5): 1 g via INTRAVENOUS
  Filled 2019-11-24 (×2): qty 10
  Filled 2019-11-24 (×2): qty 1
  Filled 2019-11-24: qty 10

## 2019-11-24 MED ORDER — TOPIRAMATE 25 MG PO CPSP: 75.0000 mg | ORAL_CAPSULE | Freq: Two times a day (BID) | ORAL | Status: DC

## 2019-11-24 MED ORDER — INSULIN ASPART 100 UNIT/ML ~~LOC~~ SOLN: 0.0000 [IU] | Freq: Every day | SUBCUTANEOUS | Status: DC

## 2019-11-24 MED ORDER — ASPIRIN 81 MG PO CHEW
81.0000 mg | CHEWABLE_TABLET | Freq: Every day | ORAL | Status: DC
  Administered 2019-11-24 – 2019-12-04 (×11): 81 mg via ORAL
  Filled 2019-11-24 (×11): qty 1

## 2019-11-24 MED ORDER — ATORVASTATIN CALCIUM 40 MG PO TABS
40.0000 mg | ORAL_TABLET | Freq: Every day | ORAL | Status: DC
  Administered 2019-11-24 – 2019-12-03 (×10): 40 mg via ORAL
  Filled 2019-11-24 (×12): qty 1

## 2019-11-24 MED ORDER — SODIUM CHLORIDE 0.9 % IV SOLN
500.0000 mg | Freq: Once | INTRAVENOUS | Status: AC
  Administered 2019-11-24: 500 mg via INTRAVENOUS
  Filled 2019-11-24: qty 500

## 2019-11-24 MED ORDER — ACETAMINOPHEN 650 MG RE SUPP: 650.0000 mg | Freq: Four times a day (QID) | RECTAL | Status: DC | PRN

## 2019-11-24 MED ORDER — MONTELUKAST SODIUM 10 MG PO TABS
10.0000 mg | ORAL_TABLET | Freq: Every day | ORAL | Status: DC
  Administered 2019-11-24 – 2019-12-04 (×11): 10 mg via ORAL
  Filled 2019-11-24 (×11): qty 1

## 2019-11-24 MED ORDER — TRAMADOL HCL 50 MG PO TABS
50.0000 mg | ORAL_TABLET | Freq: Three times a day (TID) | ORAL | Status: DC | PRN
  Administered 2019-11-24 – 2019-11-29 (×10): 50 mg via ORAL
  Filled 2019-11-24 (×10): qty 1

## 2019-11-24 MED ORDER — OLOPATADINE HCL 0.1 % OP SOLN
1.0000 [drp] | Freq: Two times a day (BID) | OPHTHALMIC | Status: DC
  Administered 2019-11-24 – 2019-12-04 (×21): 1 [drp] via OPHTHALMIC
  Filled 2019-11-24: qty 5

## 2019-11-24 MED ORDER — INSULIN ASPART PROT & ASPART (70-30 MIX) 100 UNIT/ML ~~LOC~~ SUSP
50.0000 [IU] | Freq: Every day | SUBCUTANEOUS | Status: DC
  Administered 2019-11-24 – 2019-11-28 (×4): 50 [IU] via SUBCUTANEOUS

## 2019-11-24 MED ORDER — SODIUM CHLORIDE 0.9 % IV SOLN
1.0000 g | Freq: Once | INTRAVENOUS | Status: AC
  Administered 2019-11-24: 1 g via INTRAVENOUS
  Filled 2019-11-24: qty 10

## 2019-11-24 MED ORDER — SODIUM CHLORIDE 0.9 % IV SOLN
500.0000 mg | INTRAVENOUS | Status: DC
  Administered 2019-11-25: 500 mg via INTRAVENOUS
  Filled 2019-11-24 (×2): qty 500

## 2019-11-24 NOTE — ED Notes (Signed)
Family at bedside. 

## 2019-11-24 NOTE — ED Notes (Signed)
Report given to 2w

## 2019-11-24 NOTE — ED Provider Notes (Addendum)
[facility] EMERGENCY DEPARTMENT Provider Note   CSN: [id number] Arrival date & time: 11/23/19  1656     History No chief complaint on file.   Heidi [name] is a 69 y.o. female.  The history is provided by the patient.  Chest Pain Pain location:  Substernal area Pain quality: not dull   Pain radiates to:  Does not radiate Pain severity:  Moderate Onset quality:  Gradual Timing:  Constant Progression:  Unchanged Chronicity:  New Context: at rest   Relieved by:  Nothing Worsened by:  Nothing Ineffective treatments:  None tried Associated symptoms: shortness of breath   Associated symptoms: no abdominal pain, no anorexia, no back pain, no diaphoresis, no fever, no headache, no nausea, no palpitations, no PND, no syncope and no weakness   Risk factors: no aortic disease and not female   Patient has been seen by cardiology for same     Past Medical History:  Diagnosis Date  . Adenomatous colon polyp   . Anemia   . Anxiety   . Asthma   . CHF (congestive heart failure) ([place])   . Diabetes mellitus without complication ([place])   . High cholesterol   . Hypertension   . Left knee injury   . Pneumonia     Patient Active Problem List   Diagnosis Date Noted  . Chronic respiratory failure with hypoxia, on home oxygen therapy ([place]) 11/13/2019  . Chest pain 08/24/2019  . Cervical myelopathy ([place]) 08/16/2018  . Diabetic retinopathy of right eye associated with type 2 diabetes mellitus ([place]) 08/16/2018  . Upper airway cough syndrome 05/03/2018  . Morbid obesity due to excess calories ([place]) complicated by hbp/ dm / hyperlipidemia 09/10/2017  . Cough variant asthma vs UACS/vcd on ACEi  09/09/2017  . Anxiety and depression 09/06/2017  . Mild persistent asthma without complication 78/58/8502  . Paranoia ([place]) 09/06/2017  . Torn left ear lobe 11/20/2016  . Chronic migraine w/o aura w/o status migrainosus, not intractable 06/27/2016  . Post-menopausal atrophic  vaginitis 11/14/2015  . Primary osteoarthritis of both knees 07/18/2015  . GERD (gastroesophageal reflux disease) 05/08/2015  . Renal cyst 05/08/2015  . Adenomatous polyp of colon 03/26/2015  . Essential hypertension 02/12/2015  . Cognitive deficit due to old head trauma 02/12/2015  . Asthma, chronic 02/12/2015  . Chronic left shoulder pain 02/11/2015  . Thyroid nodule 12/18/2014  . Chronic neck pain   . HLD (hyperlipidemia)   . OSA treated with BiPAP 11/27/2014  . Obesity hypoventilation syndrome ([place]) 11/27/2014  . Demand ischemia ([place])   . Uncontrolled type 2 diabetes mellitus with hyperglycemia ([place])   . Cardiomegaly 11/25/2014  . CHF (congestive heart failure) ([place]) 11/25/2014  . DM type 2 causing CKD stage 2 ([place]) 11/25/2014    Past Surgical History:  Procedure Laterality Date  . ABDOMINAL HYSTERECTOMY    . CATARACT EXTRACTION Left   . CATARACT EXTRACTION     LT 01/2017, 02/2017 on RT  . CESAREAN SECTION       OB History   No obstetric history on file.     Family History  Problem Relation Age of Onset  . Colon cancer Father   . Diabetes type II Sister   . Diabetes type II Brother   . Colon cancer Sister   . Migraines Neg Hx   . Breast cancer Neg Hx     Social History   Tobacco Use  . Smoking status: Never Smoker  . Smokeless tobacco: Never Used  Substance Use Topics  . Alcohol use: No  . Drug use: No    Home Medications Prior to Admission medications   Medication Sig Start Date End Date Taking? Authorizing Provider  albuterol (PROVENTIL) (2.5 MG/3ML) 0.083% nebulizer solution USE 1 VIAL VIA NEBULIZER EVERY 6 HOURS AS NEEDED FOR WHEEZING OR SHORTNESS OF BREATH Patient taking differently: Take 2.5 mg by nebulization every 6 (six) hours as needed for wheezing or shortness of breath.  02/21/18  Yes Ladell Pier, MD  albuterol (VENTOLIN HFA) 108 (90 Base) MCG/ACT inhaler Inhale 1-2 puffs into the lungs every 6 (six) hours as needed for wheezing or  shortness of breath. 06/09/19  Yes Ladell Pier, MD  aspirin 81 MG chewable tablet Chew 81 mg by mouth daily.    Yes [provider]  atenolol (TENORMIN) 25 MG tablet Take 1 tablet (25 mg total) by mouth daily. 08/30/19  Yes McClung, Angela M, PA-C  atorvastatin (LIPITOR) 40 MG tablet TAKE 1 TABLET BY MOUTH DAILY AT 6 PM Patient taking differently: Take 40 mg by mouth daily.  06/09/19  Yes Ladell Pier, MD  budesonide-formoterol (SYMBICORT) 80-4.5 MCG/ACT inhaler Inhale 2 puffs into the lungs 2 (two) times daily. 11/13/19  Yes Ladell Pier, MD  Fexofenadine HCl [facility] ALLERGY PO) Take 1 tablet by mouth daily as needed (for allergy).   Yes [provider]  hydrochlorothiazide (HYDRODIURIL) 25 MG tablet Take 1 tablet (25 mg total) by mouth daily. 08/30/19  Yes McClung, Angela M, PA-C  insulin aspart protamine- aspart (NOVOLOG MIX 70/30) (70-30) 100 UNIT/ML injection INJECT 80 UNITS UNDER THE SKIN IN THE MORNING AND 50 UNITS IN THE EVENING WITH MEALS Patient taking differently: Inject 50-80 Units into the skin See admin instructions. Inject 80 units in the morning and 50 units in the evening. 11/13/19  Yes Ladell Pier, MD  losartan (COZAAR) 50 MG tablet Take 1 tablet (50 mg total) by mouth daily. 08/30/19  Yes McClung, Angela M, PA-C  montelukast (SINGULAIR) 10 MG tablet Take 1 tablet (10 mg total) by mouth at bedtime. Patient taking differently: Take 10 mg by mouth daily.  08/30/19  Yes Argentina Donovan, PA-C  Multiple Vitamin (MULTIVITAMIN WITH MINERALS) TABS tablet Take 1 tablet by mouth daily.   Yes [provider]  nitroGLYCERIN (NITROSTAT) 0.3 MG SL tablet Place 1 tablet (0.3 mg total) under the tongue every 5 (five) minutes as needed for chest pain. Max 3 tablets in 15 minutes 08/30/19  Yes McClung, Angela M, PA-C  olopatadine (PATANOL) 0.1 % ophthalmic solution Place 1 drop into both eyes 2 (two) times daily. 08/26/19  Yes Asencion Noble, MD   pantoprazole (PROTONIX) 40 MG tablet Take 1 tablet (40 mg total) by mouth daily. Must have office visit for refills 06/09/19  Yes Ladell Pier, MD  Potassium Chloride ER 20 MEQ TBCR Take 1 tablet by mouth daily. 06/15/19  Yes Ladell Pier, MD  tetrahydrozoline-zinc (VISINE-AC) 0.05-0.25 % ophthalmic solution Place 1 drop into both eyes 3 (three) times daily as needed.   Yes [provider]  topiramate (TOPAMAX) 25 MG capsule Take 3 capsules (75 mg total) by mouth 2 (two) times daily. 11/13/19  Yes Ladell Pier, MD  traMADol (ULTRAM) 50 MG tablet Take 1 tablet (50 mg total) by mouth every 8 (eight) hours as needed. 11/13/19  Yes Ladell Pier, MD  Blood Glucose Monitoring Suppl ([place]) w/Device KIT Use as directed to test blood  sugar three times daily DX E11.65 08/31/19   Ladell Pier, MD  glucose blood [facility] VERIO) test strip Use as directed to test blood sugar three times daily DX E11.65 08/31/19   Ladell Pier, MD  INSULIN SYRINGE 1CC/29G 29G X 1/2" 1 ML MISC USE THREE TIMES DAILY AS DIRECTED 07/18/19   Ladell Pier, MD  OneTouch Delica Lancets 42A MISC . Use as directed to test blood sugar three times daily DX E11.65 08/31/19   Ladell Pier, MD    Allergies    Januvia [sitagliptin]  Review of Systems   Review of Systems  Constitutional: Negative for diaphoresis and fever.  HENT: Negative for congestion.   Eyes: Negative for visual disturbance.  Respiratory: Positive for shortness of breath.   Cardiovascular: Positive for chest pain and leg swelling. Negative for palpitations, syncope and PND.  Gastrointestinal: Negative for abdominal pain, anorexia and nausea.  Genitourinary: Negative for difficulty urinating.  Musculoskeletal: Negative for back pain.  Neurological: Negative for weakness and headaches.  All other systems reviewed and are negative.   Physical Exam Updated Vital Signs BP (!) 127/59   Pulse 81    Temp 98.8 F (37.1 C) (Oral)   Resp (!) 24   Ht 5' 3"  (1.6 m)   Wt 108.9 kg   SpO2 98%   BMI 42.51 kg/m   Physical Exam Vitals and nursing note reviewed.  Constitutional:      General: She is not in acute distress.    Appearance: Normal appearance.  HENT:     Head: Normocephalic and atraumatic.     Nose: Nose normal.  Eyes:     Pupils: Pupils are equal, round, and reactive to light.  Cardiovascular:     Rate and Rhythm: Normal rate and regular rhythm.     Pulses: Normal pulses.     Heart sounds: Normal heart sounds.  Pulmonary:     Effort: No respiratory distress.     Breath sounds: No wheezing or rales.  Abdominal:     General: Abdomen is flat. Bowel sounds are normal.     Tenderness: There is no abdominal tenderness. There is no guarding.  Musculoskeletal:     Cervical back: Normal range of motion and neck supple.     Right lower leg: Edema present.     Left lower leg: Edema present.  Skin:    General: Skin is warm and dry.     Capillary Refill: Capillary refill takes less than 2 seconds.  Neurological:     General: No focal deficit present.     Mental Status: She is alert and oriented to person, place, and time.     Deep Tendon Reflexes: Reflexes normal.  Psychiatric:        Mood and Affect: Mood normal.        Behavior: Behavior normal.     ED Results / Procedures / Treatments   Labs (all labs ordered are listed, but only abnormal results are displayed) Results for orders placed or performed during the hospital encounter of 83/41/96  Basic metabolic panel  Result Value Ref Range   Sodium 143 135 - 145 mmol/L   Potassium 4.0 3.5 - 5.1 mmol/L   Chloride 97 (L) 98 - 111 mmol/L   CO2 34 (H) 22 - 32 mmol/L   Glucose, Bld 115 (H) 70 - 99 mg/dL   BUN 10 8 - 23 mg/dL   Creatinine, Ser 1.00 0.44 - 1.00 mg/dL   Calcium 9.2 8.9 -  10.3 mg/dL   GFR calc non Af Amer 58 (L) >60 mL/min   GFR calc Af Amer >60 >60 mL/min   Anion gap 12 5 - 15  CBC  Result Value Ref  Range   WBC 7.4 4.0 - 10.5 K/uL   RBC 4.94 3.87 - 5.11 MIL/uL   Hemoglobin 13.4 12.0 - 15.0 g/dL   HCT 45.0 36.0 - 46.0 %   MCV 91.1 80.0 - 100.0 fL   MCH 27.1 26.0 - 34.0 pg   MCHC 29.8 (L) 30.0 - 36.0 g/dL   RDW 15.2 11.5 - 15.5 %   Platelets 222 150 - 400 K/uL   nRBC 0.0 0.0 - 0.2 %  CBG monitoring, ED  Result Value Ref Range   Glucose-Capillary 137 (H) 70 - 99 mg/dL  CBG monitoring, ED  Result Value Ref Range   Glucose-Capillary 156 (H) 70 - 99 mg/dL  Troponin I (High Sensitivity)  Result Value Ref Range   Troponin I (High Sensitivity) 4 <18 ng/L  Troponin I (High Sensitivity)  Result Value Ref Range   Troponin I (High Sensitivity) 4 <18 ng/L   DG Chest 2 View  Result Date: 11/23/2019 CLINICAL DATA:  Chest pain. EXAM: CHEST - 2 VIEW COMPARISON:  None. FINDINGS: Mild diffuse chronic appearing increased interstitial lung markings are seen. There is no evidence of focal consolidation, pleural effusion or pneumothorax. The cardiac silhouette is mildly enlarged and unchanged in size. The visualized skeletal structures are unremarkable. IMPRESSION: No active cardiopulmonary disease. Electronically Signed   By: Virgina Norfolk M.D.   On: 11/23/2019 17:24   CT Angio Chest PE W and/or Wo Contrast  Result Date: 11/24/2019 CLINICAL DATA:  Chest pain. Shortness of breath. EXAM: CT ANGIOGRAPHY CHEST WITH CONTRAST TECHNIQUE: Multidetector CT imaging of the chest was performed using the standard protocol during bolus administration of intravenous contrast. Multiplanar CT image reconstructions and MIPs were obtained to evaluate the vascular anatomy. CONTRAST:  44m OMNIPAQUE IOHEXOL 350 MG/ML SOLN COMPARISON:  None. FINDINGS: Cardiovascular: Contrast injection is sufficient to demonstrate satisfactory opacification of the pulmonary arteries to the segmental level. There is no pulmonary embolus. The main pulmonary artery is within normal limits for size. There is no CT evidence of acute right  heart strain. The visualized aorta is normal. Heart size is enlarged. Mediastinum/Nodes: --No mediastinal or hilar lymphadenopathy. --No axillary lymphadenopathy. --No supraclavicular lymphadenopathy. --there is a 1.5 cm left-sided partially calcified thyroid nodule (axial series 5, image 21). --The esophagus is unremarkable Lungs/Pleura: There are hazy bilateral ground-glass airspace opacities primarily at the lung bases. There is some linear atelectasis at the lung bases, left worse than right. The lung volumes are low. There is no pneumothorax or significant pleural effusion. Upper Abdomen: There is a large partially visualized left renal cyst which appears similar to prior study in 2016 given differences in technique and slice selection. There is scattered bilateral breast nodules measuring up to approximately 8 mm on the left. Musculoskeletal: No chest wall abnormality. No acute or significant osseous findings. Review of the MIP images confirms the above findings. IMPRESSION: 1. No evidence for pulmonary embolus. 2. Cardiomegaly with hazy bilateral ground-glass airspace opacities primarily at the lung bases, suggestive of an atypical infectious process such as viral pneumonia. 3. Scattered bilateral breast nodules measuring up to approximately 8 mm on the left. Correlation with outpatient mammography is recommended. 4. Partially calcified 1.5 cm left-sided thyroid nodule. Follow-up with a nonemergent outpatient thyroid ultrasound is recommended for further evaluation if this  has not already been performed.(Ref: J Am Coll Radiol. 2015 Feb;12(2): 143-50). Electronically Signed   By: Constance Holster M.D.   On: 11/24/2019 01:58    EKG EKG Interpretation  Date/Time:  Thursday Nov 23 2019 17:06:00 EDT Ventricular Rate:  81 PR Interval:  186 QRS Duration: 80 QT Interval:  358 QTC Calculation: 415 R Axis:   -62 Text Interpretation: Normal sinus rhythm Left axis deviation Confirmed by Randal Buba, Desirie Minteer  ([id number]) on 11/23/2019 11:04:30 PM   Radiology DG Chest 2 View  Result Date: 11/23/2019 CLINICAL DATA:  Chest pain. EXAM: CHEST - 2 VIEW COMPARISON:  None. FINDINGS: Mild diffuse chronic appearing increased interstitial lung markings are seen. There is no evidence of focal consolidation, pleural effusion or pneumothorax. The cardiac silhouette is mildly enlarged and unchanged in size. The visualized skeletal structures are unremarkable. IMPRESSION: No active cardiopulmonary disease. Electronically Signed   By: Virgina Norfolk M.D.   On: 11/23/2019 17:24   CT Angio Chest PE W and/or Wo Contrast  Result Date: 11/24/2019 CLINICAL DATA:  Chest pain. Shortness of breath. EXAM: CT ANGIOGRAPHY CHEST WITH CONTRAST TECHNIQUE: Multidetector CT imaging of the chest was performed using the standard protocol during bolus administration of intravenous contrast. Multiplanar CT image reconstructions and MIPs were obtained to evaluate the vascular anatomy. CONTRAST:  4m OMNIPAQUE IOHEXOL 350 MG/ML SOLN COMPARISON:  None. FINDINGS: Cardiovascular: Contrast injection is sufficient to demonstrate satisfactory opacification of the pulmonary arteries to the segmental level. There is no pulmonary embolus. The main pulmonary artery is within normal limits for size. There is no CT evidence of acute right heart strain. The visualized aorta is normal. Heart size is enlarged. Mediastinum/Nodes: --No mediastinal or hilar lymphadenopathy. --No axillary lymphadenopathy. --No supraclavicular lymphadenopathy. --there is a 1.5 cm left-sided partially calcified thyroid nodule (axial series 5, image 21). --The esophagus is unremarkable Lungs/Pleura: There are hazy bilateral ground-glass airspace opacities primarily at the lung bases. There is some linear atelectasis at the lung bases, left worse than right. The lung volumes are low. There is no pneumothorax or significant pleural effusion. Upper Abdomen: There is a large partially  visualized left renal cyst which appears similar to prior study in 2016 given differences in technique and slice selection. There is scattered bilateral breast nodules measuring up to approximately 8 mm on the left. Musculoskeletal: No chest wall abnormality. No acute or significant osseous findings. Review of the MIP images confirms the above findings. IMPRESSION: 1. No evidence for pulmonary embolus. 2. Cardiomegaly with hazy bilateral ground-glass airspace opacities primarily at the lung bases, suggestive of an atypical infectious process such as viral pneumonia. 3. Scattered bilateral breast nodules measuring up to approximately 8 mm on the left. Correlation with outpatient mammography is recommended. 4. Partially calcified 1.5 cm left-sided thyroid nodule. Follow-up with a nonemergent outpatient thyroid ultrasound is recommended for further evaluation if this has not already been performed.(Ref: J Am Coll Radiol. 2015 Feb;12(2): 143-50). Electronically Signed   By: CConstance HolsterM.D.   On: 11/24/2019 01:58    Procedures Procedures (including critical care time)  Medications Ordered in ED Medications  sodium chloride flush (NS) 0.9 % injection 3 mL (has no administration in time range)  cefTRIAXone (ROCEPHIN) 1 g in sodium chloride 0.9 % 100 mL IVPB (has no administration in time range)  azithromycin (ZITHROMAX) 500 mg in sodium chloride 0.9 % 250 mL IVPB (has no administration in time range)  alum & mag hydroxide-simeth (MAALOX/MYLANTA) 200-200-20 MG/5ML suspension 30 mL (30 mLs Oral Given  11/23/19 2323)  iohexol (OMNIPAQUE) 350 MG/ML injection 75 mL (75 mLs Intravenous Contrast Given 11/24/19 0115)    ED Course  I have reviewed the triage vital signs and the nursing notes.  Pertinent labs & imaging results that were available during my care of the patient were reviewed by me and considered in my medical decision making (see chart for details).    Given atypical pain with multifocal PNA  will admit to medicine.  Cultures drawn.  I do not believe this is cardiac as it is very atypical and has gone on for days.  Cardiology also felt it to be atypical.  Patient has also ruled out for PE in the ED. Final Clinical Impression(s) / ED Diagnoses Admit to medicine    Elzie Knisley, MD 11/24/19 0475    Randal Buba, Olivianna Higley, MD 11/24/19 3391

## 2019-11-24 NOTE — H&P (Signed)
History and Physical    Heidi [name] OIN:[id number] DOB: 1950/09/21 DOA: 11/23/2019  PCP: Ladell Pier, MD Patient coming from: Home  Chief Complaint: Chest pain, shortness of breath  HPI: Heidi [name] is a 69 y.o. female with medical history significant of asthma, CHF, hypertension, hyperlipidemia, CKD stage III, insulin-dependent diabetes, morbid obesity (BMI 42.51) presenting with complaints of chest pain and shortness of breath. Patient reports 3-week history of intermittent episodes of substernal pressure-like chest pain which starts every time she tries to move. Chest pain radiates to the left side of the chest. Yesterday morning she started having chest pain again and felt like she was going to pass out. Also complaining of shortness of breath. States she saw her doctor on May 10 and her oxygen saturation was 85% so she was started on home oxygen. She is using 2 L oxygen. She has also been coughing. She has not been vaccinated for Covid.  ED Course: Afebrile.  Oxygen saturation 90% on room air, placed on 2 L supplemental oxygen.  Labs showing no leukocytosis.  High-sensitivity troponin negative x2.  EKG without acute ischemic changes.  Blood culture x2 pending.  CT angiogram chest negative for PE.  Showing hazy bilateral groundglass airspace opacities primarily at the lung bases concerning for an atypical infectious process such as viral pneumonia.  SARS-CoV-2 PCR test negative.  Patient was given ceftriaxone and azithromycin.  Review of Systems:  All systems reviewed and apart from history of presenting illness, are negative.  Past Medical History:  Diagnosis Date  . Adenomatous colon polyp   . Anemia   . Anxiety   . Asthma   . CHF (congestive heart failure) ([place])   . Diabetes mellitus without complication ([place])   . High cholesterol   . Hypertension   . Left knee injury   . Pneumonia     Past Surgical History:  Procedure Laterality Date  . ABDOMINAL HYSTERECTOMY    .  CATARACT EXTRACTION Left   . CATARACT EXTRACTION     LT 01/2017, 02/2017 on RT  . CESAREAN SECTION       reports that she has never smoked. She has never used smokeless tobacco. She reports that she does not drink alcohol or use drugs.  Allergies  Allergen Reactions  . Januvia [Sitagliptin]     Chest pains    Family History  Problem Relation Age of Onset  . Colon cancer Father   . Diabetes type II Sister   . Diabetes type II Brother   . Colon cancer Sister   . Migraines Neg Hx   . Breast cancer Neg Hx     Prior to Admission medications   Medication Sig Start Date End Date Taking? Authorizing Provider  albuterol (PROVENTIL) (2.5 MG/3ML) 0.083% nebulizer solution USE 1 VIAL VIA NEBULIZER EVERY 6 HOURS AS NEEDED FOR WHEEZING OR SHORTNESS OF BREATH Patient taking differently: Take 2.5 mg by nebulization every 6 (six) hours as needed for wheezing or shortness of breath.  02/21/18  Yes Ladell Pier, MD  albuterol (VENTOLIN HFA) 108 (90 Base) MCG/ACT inhaler Inhale 1-2 puffs into the lungs every 6 (six) hours as needed for wheezing or shortness of breath. 06/09/19  Yes Ladell Pier, MD  aspirin 81 MG chewable tablet Chew 81 mg by mouth daily.    Yes [provider]  atenolol (TENORMIN) 25 MG tablet Take 1 tablet (25 mg total) by mouth daily. 08/30/19  Yes Freeman Caldron M, PA-C  atorvastatin (LIPITOR) 40 MG  tablet TAKE 1 TABLET BY MOUTH DAILY AT 6 PM Patient taking differently: Take 40 mg by mouth daily.  06/09/19  Yes Ladell Pier, MD  budesonide-formoterol (SYMBICORT) 80-4.5 MCG/ACT inhaler Inhale 2 puffs into the lungs 2 (two) times daily. 11/13/19  Yes Ladell Pier, MD  Fexofenadine HCl [facility] ALLERGY PO) Take 1 tablet by mouth daily as needed (for allergy).   Yes [provider]  hydrochlorothiazide (HYDRODIURIL) 25 MG tablet Take 1 tablet (25 mg total) by mouth daily. 08/30/19  Yes McClung, Angela M, PA-C  insulin aspart protamine- aspart  (NOVOLOG MIX 70/30) (70-30) 100 UNIT/ML injection INJECT 80 UNITS UNDER THE SKIN IN THE MORNING AND 50 UNITS IN THE EVENING WITH MEALS Patient taking differently: Inject 50-80 Units into the skin See admin instructions. Inject 80 units in the morning and 50 units in the evening. 11/13/19  Yes Ladell Pier, MD  losartan (COZAAR) 50 MG tablet Take 1 tablet (50 mg total) by mouth daily. 08/30/19  Yes McClung, Angela M, PA-C  montelukast (SINGULAIR) 10 MG tablet Take 1 tablet (10 mg total) by mouth at bedtime. Patient taking differently: Take 10 mg by mouth daily.  08/30/19  Yes Argentina Donovan, PA-C  Multiple Vitamin (MULTIVITAMIN WITH MINERALS) TABS tablet Take 1 tablet by mouth daily.   Yes [provider]  nitroGLYCERIN (NITROSTAT) 0.3 MG SL tablet Place 1 tablet (0.3 mg total) under the tongue every 5 (five) minutes as needed for chest pain. Max 3 tablets in 15 minutes 08/30/19  Yes McClung, Angela M, PA-C  olopatadine (PATANOL) 0.1 % ophthalmic solution Place 1 drop into both eyes 2 (two) times daily. 08/26/19  Yes Asencion Noble, MD  pantoprazole (PROTONIX) 40 MG tablet Take 1 tablet (40 mg total) by mouth daily. Must have office visit for refills 06/09/19  Yes Ladell Pier, MD  Potassium Chloride ER 20 MEQ TBCR Take 1 tablet by mouth daily. 06/15/19  Yes Ladell Pier, MD  tetrahydrozoline-zinc (VISINE-AC) 0.05-0.25 % ophthalmic solution Place 1 drop into both eyes 3 (three) times daily as needed.   Yes [provider]  topiramate (TOPAMAX) 25 MG capsule Take 3 capsules (75 mg total) by mouth 2 (two) times daily. 11/13/19  Yes Ladell Pier, MD  traMADol (ULTRAM) 50 MG tablet Take 1 tablet (50 mg total) by mouth every 8 (eight) hours as needed. 11/13/19  Yes Ladell Pier, MD  Blood Glucose Monitoring Suppl (ONETOUCH VERIO FLEX SYSTEM) w/Device KIT Use as directed to test blood sugar three times daily DX E11.65 08/31/19   Ladell Pier, MD  glucose  blood (ONETOUCH VERIO) test strip Use as directed to test blood sugar three times daily DX E11.65 08/31/19   Ladell Pier, MD  INSULIN SYRINGE 1CC/29G 29G X 1/2" 1 ML MISC USE THREE TIMES DAILY AS DIRECTED 07/18/19   Ladell Pier, MD  OneTouch Delica Lancets 78M MISC . Use as directed to test blood sugar three times daily DX E11.65 08/31/19   Ladell Pier, MD    Physical Exam: Vitals:   11/23/19 2230 11/23/19 2245 11/24/19 0308 11/24/19 0611  BP: (!) 108/58 (!) 127/59 (!) 107/47 115/60  Pulse: 77 81 (!) 102 (!) 101  Resp: 18 (!) 24 16 18   Temp:      TempSrc:      SpO2: 98% 98% 98% 100%  Weight:      Height:        Physical Exam  Constitutional:  She is oriented to person, place, and time. She appears well-developed and well-nourished. No distress.  Resting comfortably  HENT:  Head: Normocephalic.  Eyes: Right eye exhibits no discharge. Left eye exhibits no discharge.  Cardiovascular: Normal rate, regular rhythm and intact distal pulses.  Pulmonary/Chest: Breath sounds normal. No respiratory distress. She has no wheezes. She has no rales.  Chest pain reproducible upon palpation of the sternum/anterior chest wall Tachypneic with respiratory rate in the mid 20s Satting well on 2 L supplemental oxygen  Abdominal: Soft. Bowel sounds are normal. She exhibits no distension. There is no abdominal tenderness. There is no guarding.  Musculoskeletal:        General: Edema present.     Cervical back: Neck supple.     Comments: +3 pitting edema bilateral lower extremities  Neurological: She is alert and oriented to person, place, and time.  Skin: Skin is warm and dry. She is not diaphoretic.    Labs on Admission: I have personally reviewed following labs and imaging studies  CBC: Recent Labs  Lab 11/23/19 1706  WBC 7.4  HGB 13.4  HCT 45.0  MCV 91.1  PLT 947   Basic Metabolic Panel: Recent Labs  Lab 11/23/19 1706  NA 143  K 4.0  CL 97*  CO2 34*  GLUCOSE 115*   BUN 10  CREATININE 1.00  CALCIUM 9.2   GFR: Estimated Creatinine Clearance: 64.6 mL/min (by C-G formula based on SCr of 1 mg/dL). Liver Function Tests: No results for input(s): AST, ALT, ALKPHOS, BILITOT, PROT, ALBUMIN in the last 168 hours. No results for input(s): LIPASE, AMYLASE in the last 168 hours. No results for input(s): AMMONIA in the last 168 hours. Coagulation Profile: No results for input(s): INR, PROTIME in the last 168 hours. Cardiac Enzymes: No results for input(s): CKTOTAL, CKMB, CKMBINDEX, TROPONINI in the last 168 hours. BNP (last 3 results) No results for input(s): PROBNP in the last 8760 hours. HbA1C: No results for input(s): HGBA1C in the last 72 hours. CBG: Recent Labs  Lab 11/23/19 2011 11/23/19 2329  GLUCAP 137* 156*   Lipid Profile: No results for input(s): CHOL, HDL, LDLCALC, TRIG, CHOLHDL, LDLDIRECT in the last 72 hours. Thyroid Function Tests: No results for input(s): TSH, T4TOTAL, FREET4, T3FREE, THYROIDAB in the last 72 hours. Anemia Panel: No results for input(s): VITAMINB12, FOLATE, FERRITIN, TIBC, IRON, RETICCTPCT in the last 72 hours. Urine analysis:    Component Value Date/Time   COLORURINE YELLOW 08/25/2019 1600   APPEARANCEUR CLEAR 08/25/2019 1600   LABSPEC 1.011 08/25/2019 1600   PHURINE 6.0 08/25/2019 1600   GLUCOSEU NEGATIVE 08/25/2019 1600   HGBUR NEGATIVE 08/25/2019 1600   BILIRUBINUR NEGATIVE 08/25/2019 1600   BILIRUBINUR Negative 05/12/2016 1703   KETONESUR NEGATIVE 08/25/2019 1600   PROTEINUR NEGATIVE 08/25/2019 1600   UROBILINOGEN 0.2 04/25/2019 1508   NITRITE NEGATIVE 08/25/2019 1600   LEUKOCYTESUR NEGATIVE 08/25/2019 1600    Radiological Exams on Admission: DG Chest 2 View  Result Date: 11/23/2019 CLINICAL DATA:  Chest pain. EXAM: CHEST - 2 VIEW COMPARISON:  None. FINDINGS: Mild diffuse chronic appearing increased interstitial lung markings are seen. There is no evidence of focal consolidation, pleural effusion or  pneumothorax. The cardiac silhouette is mildly enlarged and unchanged in size. The visualized skeletal structures are unremarkable. IMPRESSION: No active cardiopulmonary disease. Electronically Signed   By: Virgina Norfolk M.D.   On: 11/23/2019 17:24   CT Angio Chest PE W and/or Wo Contrast  Result Date: 11/24/2019 CLINICAL DATA:  Chest  pain. Shortness of breath. EXAM: CT ANGIOGRAPHY CHEST WITH CONTRAST TECHNIQUE: Multidetector CT imaging of the chest was performed using the standard protocol during bolus administration of intravenous contrast. Multiplanar CT image reconstructions and MIPs were obtained to evaluate the vascular anatomy. CONTRAST:  11m OMNIPAQUE IOHEXOL 350 MG/ML SOLN COMPARISON:  None. FINDINGS: Cardiovascular: Contrast injection is sufficient to demonstrate satisfactory opacification of the pulmonary arteries to the segmental level. There is no pulmonary embolus. The main pulmonary artery is within normal limits for size. There is no CT evidence of acute right heart strain. The visualized aorta is normal. Heart size is enlarged. Mediastinum/Nodes: --No mediastinal or hilar lymphadenopathy. --No axillary lymphadenopathy. --No supraclavicular lymphadenopathy. --there is a 1.5 cm left-sided partially calcified thyroid nodule (axial series 5, image 21). --The esophagus is unremarkable Lungs/Pleura: There are hazy bilateral ground-glass airspace opacities primarily at the lung bases. There is some linear atelectasis at the lung bases, left worse than right. The lung volumes are low. There is no pneumothorax or significant pleural effusion. Upper Abdomen: There is a large partially visualized left renal cyst which appears similar to prior study in 2016 given differences in technique and slice selection. There is scattered bilateral breast nodules measuring up to approximately 8 mm on the left. Musculoskeletal: No chest wall abnormality. No acute or significant osseous findings. Review of the MIP  images confirms the above findings. IMPRESSION: 1. No evidence for pulmonary embolus. 2. Cardiomegaly with hazy bilateral ground-glass airspace opacities primarily at the lung bases, suggestive of an atypical infectious process such as viral pneumonia. 3. Scattered bilateral breast nodules measuring up to approximately 8 mm on the left. Correlation with outpatient mammography is recommended. 4. Partially calcified 1.5 cm left-sided thyroid nodule. Follow-up with a nonemergent outpatient thyroid ultrasound is recommended for further evaluation if this has not already been performed.(Ref: J Am Coll Radiol. 2015 Feb;12(2): 143-50). Electronically Signed   By: CConstance HolsterM.D.   On: 11/24/2019 01:58    EKG: Independently reviewed.  Sinus rhythm.  No acute ischemic changes.  Assessment/Plan Principal Problem:   Multifocal pneumonia Active Problems:   Thyroid nodule   Chest pain   Acute respiratory failure (HCC)   Breast nodule   Acute hypoxic respiratory failure secondary to multifocal pneumonia: No fever, leukocytosis, or signs of sepsis.  Chest x-ray showing hazy bilateral groundglass airspace opacities primarily at the lung bases concerning for an atypical infectious process such as viral pneumonia.  SARS-CoV-2 PCR test negative.  Oxygen saturation 90% on room air, improved with 2 L supplemental oxygen. -Continue ceftriaxone and azithromycin.  Check procalcitonin level.  Order respiratory viral panel.  Blood culture x2 pending.  Continuous pulse ox.  Supplemental oxygen, wean as tolerated.  Chest pain: ACS less likely given high-sensitivity troponin x2 negative and EKG without acute ischemic changes.  CT angiogram negative for PE.  Chest pain could possibly be musculoskeletal as it is reproducible on exam.  Patient was seen by cardiology 2 days ago and plan was to do a nuclear stress test for stratification given coronary calcification on CT scan in 2016.  Unable to perform treadmill exercise  stress test due to oxygen dependency and shortness of breath. -Continue cardiac monitoring.  Continue aspirin and statin.  Consult cardiology in a.m. for nuclear stress test.  Breast nodules: CT showing scattered bilateral breast nodules measuring up to approximately 8 mm on the left. -Patient will need outpatient mammography.  Thyroid nodule: CT showing a partially calcified 1.5 cm left-sided thyroid nodule. -Thyroid ultrasound,  check TSH level  Asthma: Stable.  No bronchospasm.  Continue home inhalers.  Poorly controlled insulin-dependent diabetes: A1c 10.5 on 11/13/2019.  Continue home basal insulin.  Order sliding scale insulin and CBG checks.  DVT prophylaxis: Lovenox Code Status: Full code Family Communication: No family available at this time. Disposition Plan: Status is: Inpatient  Remains inpatient appropriate because:IV treatments appropriate due to intensity of illness or inability to take PO   Dispo: The patient is from: Home              Anticipated d/c is to: Home              Anticipated d/c date is: 2 days              Patient currently is not medically stable to d/c.  The medical decision making on this patient was of high complexity and the patient is at high risk for clinical deterioration, therefore this is a level 3 visit.  Shela Leff MD [facility]  If 7PM-7AM, please contact night-coverage [email or web address]  11/24/2019, 7:50 AM

## 2019-11-24 NOTE — Progress Notes (Signed)
Pt seen and examined at bedside.  She was admitted earlier this morning by Dr. Marlowe Sax please see her note for detailed H&P.   69 year old lady prior history of asthma, diastolic heart failure, hypertension, stage IIIb CKD, insulin-dependent diabetes mellitus, morbid obesity presents with shortness of breath and chest pain.  Chest x-ray shows hazy bilateral opacities in the lung bases concerning for atypical infectious process such as viral pneumonia.  CT angiogram of the chest ruled out PE, SARS Covid PCR test is negative.  She was admitted for multifocal pneumonia and was started on IV Rocephin and Zithromax.   Patient alert oriented and answering questions appropriately Lungs scattered rhonchi bilateral, tachypnea present air entry fair. Cardiovascular S1-S2 heard, no JVD, no murmurs heard Abdomen is soft, nontender, nondistended, bowel sounds normal    Plan:  Continue with IV rocephin and IV zithromax.     Hosie Poisson, MD [phone]

## 2019-11-25 ENCOUNTER — Inpatient Hospital Stay (HOSPITAL_COMMUNITY): Payer: Medicare HMO

## 2019-11-25 DIAGNOSIS — N63 Unspecified lump in unspecified breast: Secondary | ICD-10-CM

## 2019-11-25 DIAGNOSIS — J9601 Acute respiratory failure with hypoxia: Secondary | ICD-10-CM

## 2019-11-25 DIAGNOSIS — E041 Nontoxic single thyroid nodule: Secondary | ICD-10-CM

## 2019-11-25 LAB — BASIC METABOLIC PANEL
Anion gap: 11 (ref 5–15)
BUN: 12 mg/dL (ref 8–23)
CO2: 30 mmol/L (ref 22–32)
Calcium: 9.1 mg/dL (ref 8.9–10.3)
Chloride: 101 mmol/L (ref 98–111)
Creatinine, Ser: 1.15 mg/dL — ABNORMAL HIGH (ref 0.44–1.00)
GFR calc Af Amer: 57 mL/min — ABNORMAL LOW (ref >60)
GFR calc non Af Amer: 49 mL/min — ABNORMAL LOW (ref >60)
Glucose, Bld: 231 mg/dL — ABNORMAL HIGH (ref 70–99)
Potassium: 3.9 mmol/L (ref 3.5–5.1)
Sodium: 142 mmol/L (ref 135–145)

## 2019-11-25 LAB — GLUCOSE, CAPILLARY
Glucose-Capillary: 166 mg/dL — ABNORMAL HIGH (ref 70–99)
Glucose-Capillary: 211 mg/dL — ABNORMAL HIGH (ref 70–99)
Glucose-Capillary: 260 mg/dL — ABNORMAL HIGH (ref 70–99)
Glucose-Capillary: 135 mg/dL — ABNORMAL HIGH (ref 70–99)
Glucose-Capillary: 148 mg/dL — ABNORMAL HIGH (ref 70–99)

## 2019-11-25 LAB — MAGNESIUM: Magnesium: 1.8 mg/dL (ref 1.7–2.4)

## 2019-11-25 MED ORDER — AZITHROMYCIN 250 MG PO TABS
500.0000 mg | ORAL_TABLET | Freq: Every day | ORAL | Status: DC
  Administered 2019-11-26 – 2019-11-29 (×4): 500 mg via ORAL
  Filled 2019-11-25 (×4): qty 2

## 2019-11-25 MED ORDER — ONDANSETRON HCL 4 MG/2ML IJ SOLN: 4.0000 mg | Freq: Four times a day (QID) | INTRAMUSCULAR | Status: DC | PRN

## 2019-11-25 MED ORDER — INSULIN ASPART PROT & ASPART (70-30 MIX) 100 UNIT/ML ~~LOC~~ SUSP
25.0000 [IU] | Freq: Once | SUBCUTANEOUS | Status: AC
  Administered 2019-11-25: 25 [IU] via SUBCUTANEOUS
  Filled 2019-11-25: qty 10

## 2019-11-25 NOTE — Progress Notes (Signed)
PROGRESS NOTE    Heidi [name]  [id number] DOB: [birth year] DOA: 11/23/2019 PCP: Ladell Pier, MD    No chief complaint on file.   Brief Narrative:  69 year old lady prior history of asthma, diastolic heart failure, hypertension, stage IIIa CKD, insulin-dependent diabetes mellitus, morbid obesity presents with shortness of breath and chest pain.  Chest x-ray shows hazy bilateral opacities in the lung bases concerning for atypical infectious process such as viral pneumonia.  CT angiogram of the chest ruled out PE, SARS Covid PCR test is negative.  She was admitted for multifocal pneumonia and was started on IV Rocephin and Zithromax.  Assessment & Plan:   Principal Problem:   Multifocal pneumonia Active Problems:   Thyroid nodule   Chest pain   Acute respiratory failure (HCC)   Breast nodule   Acute respiratory failure with hypoxia secondary to multifocal pneumonia Patient was started on IV Rocephin and Zithromax for the multifocal pneumonia. Nasal cannula oxygen to keep sats greater than 90%. Patient is currently requiring up to 3 L nasal cannula oxygen to keep sats greater than 90% Continue with Dulera. resp panel wnl.  COVID negative.    Thyroid nodule TSH wnl.  ultrasound thyroid reviewed.    Breast nodule on the CT angiogram recommend outpatient follow-up with mammogram  Uncontrolled diabetes mellitus with hyperglycemia CBG (last 3)  Recent Labs    11/25/19 0329 11/25/19 0940 11/25/19 1156  GLUCAP 166* 211* 260*   Resume SSI and Novolog 70/30 mmhg.  A1c is 10.5 %   Hyperlipidemia  Resume lipitor.   DVT prophylaxis: Lovenox Code Status: Full code Family Communication: None at bedside Disposition:   Status is: Inpatient  Remains inpatient appropriate because:IV treatments appropriate due to intensity of illness or inability to take PO   Dispo: The patient is from: Home              Anticipated d/c is to: Pending              Anticipated  d/c date is: 1 day              Patient currently is not medically stable to d/c.        Consultants:   None  Procedures: None Antimicrobials: On Rocephin and Zithromax since admission.  Subjective: Patient reports being nauseated earlier this morning no vomiting Patient breathing is the same as yesterday on 3 L of nasal cannula oxygen.  Objective: Vitals:   11/25/19 0332 11/25/19 0433 11/25/19 0804 11/25/19 0804  BP: 137/68 (!) 107/44  128/61  Pulse:  87  92  Resp:  16  17  Temp:  98.4 F (36.9 C)  98 F (36.7 C)  TempSrc:  Oral    SpO2:  93% 93% 94%  Weight:      Height:        Intake/Output Summary (Last 24 hours) at 11/25/2019 1553 Last data filed at 11/25/2019 0300 Gross per 24 hour  Intake 117.87 ml  Output --  Net 117.87 ml   Filed Weights   11/23/19 1704  Weight: 108.9 kg    Examination:  General exam: mild discomfort from nausea.  Respiratory system: scattered rhonchi, no wheezing heard. No tachypnea noticed.  Cardiovascular system: S1 & S2 heard, RRR. No JVD, PEDAL EDEMA present.  Gastrointestinal system: Abdomen is nondistended, soft and nontender. . Normal bowel sounds heard. Central nervous system: Alert and oriented. No focal neurological deficits. Extremities: Symmetric 5 x 5 power. Skin: No rashes, lesions or ulcers  Psychiatry:  Mood & affect appropriate.     Data Reviewed: I have personally reviewed following labs and imaging studies  CBC: Recent Labs  Lab 11/23/19 1706  WBC 7.4  HGB 13.4  HCT 45.0  MCV 91.1  PLT [id number]    Basic Metabolic Panel: Recent Labs  Lab 11/23/19 1706 11/25/19 1103  NA 143 142  K 4.0 3.9  CL 97* 101  CO2 34* 30  GLUCOSE 115* 231*  BUN 10 12  CREATININE 1.00 1.15*  CALCIUM 9.2 9.1  MG  --  1.8    GFR: Estimated Creatinine Clearance: 56.2 mL/min (A) (by C-G formula based on SCr of 1.15 mg/dL (H)).  Liver Function Tests: No results for input(s): AST, ALT, ALKPHOS, BILITOT, PROT, ALBUMIN in  the last 168 hours.  CBG: Recent Labs  Lab 11/24/19 1607 11/24/19 2209 11/25/19 0329 11/25/19 0940 11/25/19 1156  GLUCAP 160* 124* 166* 211* 260*     Recent Results (from the past 240 hour(s))  Blood culture (routine x 2)     Status: None (Preliminary result)   Collection Time: 11/24/19  2:36 AM   Specimen: BLOOD  Result Value Ref Range Status   Specimen Description BLOOD RIGHT ARM  Final   Special Requests   Final    BOTTLES DRAWN AEROBIC AND ANAEROBIC Blood Culture results may not be optimal due to an inadequate volume of blood received in culture bottles   Culture   Final    NO GROWTH 1 DAY Performed at [facility], [place] [address], [place], [place] [address]    Report Status PENDING  Incomplete  SARS Coronavirus 2 by RT PCR (hospital order, performed in [facility] hospital lab) Nasopharyngeal Nasopharyngeal Swab     Status: None   Collection Time: 11/24/19  2:39 AM   Specimen: Nasopharyngeal Swab  Result Value Ref Range Status   SARS Coronavirus 2 NEGATIVE NEGATIVE Final    Comment: (NOTE) SARS-CoV-2 target nucleic acids are NOT DETECTED. The SARS-CoV-2 RNA is generally detectable in upper and lower respiratory specimens during the acute phase of infection. The lowest concentration of SARS-CoV-2 viral copies this assay can detect is 250 copies / mL. A negative result does not preclude SARS-CoV-2 infection and should not be used as the sole basis for treatment or other patient management decisions.  A negative result may occur with improper specimen collection / handling, submission of specimen other than nasopharyngeal swab, presence of viral mutation(s) within the areas targeted by this assay, and inadequate number of viral copies (<250 copies / mL). A negative result must be combined with clinical observations, patient history, and epidemiological information. Fact Sheet for Patients:   StrictlyIdeas.no Fact Sheet for Healthcare  Providers: BankingDealers.co.za This test is not yet approved or cleared  by the [place] FDA and has been authorized for detection and/or diagnosis of SARS-CoV-2 by FDA under an Emergency Use Authorization (EUA).  This EUA will remain in effect (meaning this test can be used) for the duration of the COVID-19 declaration under Section 564(b)(1) of the Act, 21 U.S.C. section 360bbb-3(b)(1), unless the authorization is terminated or revoked sooner. Performed at [facility], [place] [address] [place], [place] [address]   Blood culture (routine x 2)     Status: None (Preliminary result)   Collection Time: 11/24/19  2:39 AM   Specimen: BLOOD  Result Value Ref Range Status   Specimen Description BLOOD RIGHT HAND  Final   Special Requests   Final  BOTTLES DRAWN AEROBIC AND ANAEROBIC Blood Culture results may not be optimal due to an inadequate volume of blood received in culture bottles   Culture   Final    NO GROWTH 1 DAY Performed at [place] [address], [place], [place] [address]    Report Status PENDING  Incomplete  Respiratory Panel by PCR     Status: None   Collection Time: 11/24/19  4:41 PM   Specimen: Nasopharyngeal Swab; Respiratory  Result Value Ref Range Status   Adenovirus NOT DETECTED NOT DETECTED Final   Coronavirus 229E NOT DETECTED NOT DETECTED Final    Comment: (NOTE) The Coronavirus on the Respiratory Panel, DOES NOT test for the novel  Coronavirus (2019 nCoV)    Coronavirus HKU1 NOT DETECTED NOT DETECTED Final   Coronavirus NL63 NOT DETECTED NOT DETECTED Final   Coronavirus OC43 NOT DETECTED NOT DETECTED Final   Metapneumovirus NOT DETECTED NOT DETECTED Final   Rhinovirus / Enterovirus NOT DETECTED NOT DETECTED Final   Influenza A NOT DETECTED NOT DETECTED Final   Influenza B NOT DETECTED NOT DETECTED Final   Parainfluenza Virus 1 NOT DETECTED NOT DETECTED Final   Parainfluenza Virus 2 NOT DETECTED NOT DETECTED  Final   Parainfluenza Virus 3 NOT DETECTED NOT DETECTED Final   Parainfluenza Virus 4 NOT DETECTED NOT DETECTED Final   Respiratory Syncytial Virus NOT DETECTED NOT DETECTED Final   Bordetella pertussis NOT DETECTED NOT DETECTED Final   Chlamydophila pneumoniae NOT DETECTED NOT DETECTED Final   Mycoplasma pneumoniae NOT DETECTED NOT DETECTED Final    Comment: Performed at [facility], [place]. [address] [place], [place] [address]         Radiology Studies: DG Chest 2 View  Result Date: 11/23/2019 CLINICAL DATA:  Chest pain. EXAM: CHEST - 2 VIEW COMPARISON:  None. FINDINGS: Mild diffuse chronic appearing increased interstitial lung markings are seen. There is no evidence of focal consolidation, pleural effusion or pneumothorax. The cardiac silhouette is mildly enlarged and unchanged in size. The visualized skeletal structures are unremarkable. IMPRESSION: No active cardiopulmonary disease. Electronically Signed   By: Virgina Norfolk M.D.   On: 11/23/2019 17:24   CT Angio Chest PE W and/or Wo Contrast  Result Date: 11/24/2019 CLINICAL DATA:  Chest pain. Shortness of breath. EXAM: CT ANGIOGRAPHY CHEST WITH CONTRAST TECHNIQUE: Multidetector CT imaging of the chest was performed using the standard protocol during bolus administration of intravenous contrast. Multiplanar CT image reconstructions and MIPs were obtained to evaluate the vascular anatomy. CONTRAST:  35mL OMNIPAQUE IOHEXOL 350 MG/ML SOLN COMPARISON:  None. FINDINGS: Cardiovascular: Contrast injection is sufficient to demonstrate satisfactory opacification of the pulmonary arteries to the segmental level. There is no pulmonary embolus. The main pulmonary artery is within normal limits for size. There is no CT evidence of acute right heart strain. The visualized aorta is normal. Heart size is enlarged. Mediastinum/Nodes: --No mediastinal or hilar lymphadenopathy. --No axillary lymphadenopathy. --No supraclavicular lymphadenopathy.  --there is a 1.5 cm left-sided partially calcified thyroid nodule (axial series 5, image 21). --The esophagus is unremarkable Lungs/Pleura: There are hazy bilateral ground-glass airspace opacities primarily at the lung bases. There is some linear atelectasis at the lung bases, left worse than right. The lung volumes are low. There is no pneumothorax or significant pleural effusion. Upper Abdomen: There is a large partially visualized left renal cyst which appears similar to prior study in 2016 given differences in technique and slice selection. There is scattered bilateral breast nodules measuring  up to approximately 8 mm on the left. Musculoskeletal: No chest wall abnormality. No acute or significant osseous findings. Review of the MIP images confirms the above findings. IMPRESSION: 1. No evidence for pulmonary embolus. 2. Cardiomegaly with hazy bilateral ground-glass airspace opacities primarily at the lung bases, suggestive of an atypical infectious process such as viral pneumonia. 3. Scattered bilateral breast nodules measuring up to approximately 8 mm on the left. Correlation with outpatient mammography is recommended. 4. Partially calcified 1.5 cm left-sided thyroid nodule. Follow-up with a nonemergent outpatient thyroid ultrasound is recommended for further evaluation if this has not already been performed.(Ref: J Am Coll Radiol. 2015 Feb;12(2): 143-50). Electronically Signed   By: Constance Holster M.D.   On: 11/24/2019 01:58   US THYROID  Result Date: 11/25/2019 CLINICAL DATA:  69 year old female with a history of prior thyroid nodule biopsy 2016 on the left EXAM: THYROID ULTRASOUND TECHNIQUE: Ultrasound examination of the thyroid gland and adjacent soft tissues was performed. COMPARISON:  01/04/2015, 01/31/2015 FINDINGS: Parenchymal Echotexture: Mildly heterogenous Isthmus: 0.7 cm Right lobe: 4.8 cm x 1.7 cm x 1.3 cm Left lobe: 4.1 cm x 2.0 cm x 1.2 cm  _________________________________________________________ Estimated total number of nodules >/= 1 cm: 1 Number of spongiform nodules >/=  2 cm not described below (TR1): 0 Number of mixed cystic and solid nodules >/= 1.5 cm not described below (TR2): 0 _________________________________________________________ Nodule at the left inferior thyroid measures 1.6 cm, unchanged from the comparison ultrasound survey. By report this nodule has been previously biopsied. No adenopathy IMPRESSION: Similar appearance of left inferior thyroid nodule. This nodule has been previously biopsied. Assuming a benign result, no further specific follow-up would be indicated. Recommendations follow those established by the new ACR TI-RADS criteria (J Am Coll Radiol [id number]). Electronically Signed   By: Corrie Mckusick D.O.   On: 11/25/2019 11:33        Scheduled Meds: . aspirin  81 mg Oral Daily  . atorvastatin  40 mg Oral Daily  . [START ON 11/26/2019] azithromycin  500 mg Oral Daily  . enoxaparin (LOVENOX) injection  40 mg Subcutaneous Q24H  . insulin aspart  0-15 Units Subcutaneous TID WC  . insulin aspart protamine- aspart  50 Units Subcutaneous Q supper  . insulin aspart protamine- aspart  80 Units Subcutaneous Q breakfast  . mometasone-formoterol  2 puff Inhalation BID  . montelukast  10 mg Oral Daily  . multivitamin with minerals  1 tablet Oral Daily  . olopatadine  1 drop Both Eyes BID  . pantoprazole  40 mg Oral Daily  . sodium chloride flush  3 mL Intravenous Once  . topiramate  75 mg Oral BID   Continuous Infusions: . cefTRIAXone (ROCEPHIN)  IV 1 g (11/25/19 0138)     LOS: 1 day       Hosie Poisson, MD [facility]   To contact the attending provider between 7A-7P or the covering provider during after hours 7P-7A, please log into the web site [email or web address] and access using universal  password for that web site. If you do not have the password, please call the hospital  operator.  11/25/2019, 3:53 PM

## 2019-11-26 LAB — CBC WITH DIFFERENTIAL/PLATELET
Abs Immature Granulocytes: 0.07 10*3/uL (ref 0.00–0.07)
Basophils Absolute: 0.1 10*3/uL (ref 0.0–0.1)
Basophils Relative: 1 %
Eosinophils Absolute: 0.3 10*3/uL (ref 0.0–0.5)
Eosinophils Relative: 4 %
HCT: 40.5 % (ref 36.0–46.0)
Hemoglobin: 12.1 g/dL (ref 12.0–15.0)
Immature Granulocytes: 1 %
Lymphocytes Relative: 23 %
Lymphs Abs: 2 10*3/uL (ref 0.7–4.0)
MCH: 27.4 pg (ref 26.0–34.0)
MCHC: 29.9 g/dL — ABNORMAL LOW (ref 30.0–36.0)
MCV: 91.6 fL (ref 80.0–100.0)
Monocytes Absolute: 0.6 10*3/uL (ref 0.1–1.0)
Monocytes Relative: 7 %
Neutro Abs: 5.5 10*3/uL (ref 1.7–7.7)
Neutrophils Relative %: 64 %
Platelets: 192 10*3/uL (ref 150–400)
RBC: 4.42 MIL/uL (ref 3.87–5.11)
RDW: 15 % (ref 11.5–15.5)
WBC: 8.5 10*3/uL (ref 4.0–10.5)
nRBC: 0 % (ref 0.0–0.2)

## 2019-11-26 LAB — BASIC METABOLIC PANEL
Anion gap: 9 (ref 5–15)
BUN: 13 mg/dL (ref 8–23)
CO2: 28 mmol/L (ref 22–32)
Calcium: 8.7 mg/dL — ABNORMAL LOW (ref 8.9–10.3)
Chloride: 103 mmol/L (ref 98–111)
Creatinine, Ser: 1.23 mg/dL — ABNORMAL HIGH (ref 0.44–1.00)
GFR calc Af Amer: 53 mL/min — ABNORMAL LOW (ref >60)
GFR calc non Af Amer: 45 mL/min — ABNORMAL LOW (ref >60)
Glucose, Bld: 293 mg/dL — ABNORMAL HIGH (ref 70–99)
Potassium: 3.9 mmol/L (ref 3.5–5.1)
Sodium: 140 mmol/L (ref 135–145)

## 2019-11-26 LAB — GLUCOSE, CAPILLARY
Glucose-Capillary: 104 mg/dL — ABNORMAL HIGH (ref 70–99)
Glucose-Capillary: 248 mg/dL — ABNORMAL HIGH (ref 70–99)
Glucose-Capillary: 175 mg/dL — ABNORMAL HIGH (ref 70–99)
Glucose-Capillary: 174 mg/dL — ABNORMAL HIGH (ref 70–99)
Glucose-Capillary: 176 mg/dL — ABNORMAL HIGH (ref 70–99)

## 2019-11-26 LAB — EXPECTORATED SPUTUM ASSESSMENT W GRAM STAIN, RFLX TO RESP C

## 2019-11-26 MED ORDER — METHYLPREDNISOLONE SODIUM SUCC 40 MG IJ SOLR
40.0000 mg | Freq: Two times a day (BID) | INTRAMUSCULAR | Status: DC
  Administered 2019-11-26 – 2019-11-27 (×3): 40 mg via INTRAVENOUS
  Filled 2019-11-26 (×3): qty 1

## 2019-11-26 MED ORDER — GUAIFENESIN-DM 100-10 MG/5ML PO SYRP
5.0000 mL | ORAL_SOLUTION | ORAL | Status: DC | PRN
  Administered 2019-11-26 – 2019-12-04 (×25): 5 mL via ORAL
  Filled 2019-11-26 (×25): qty 5

## 2019-11-26 NOTE — Progress Notes (Signed)
Pt having increased productive cough, MD notified new orders for cough medication, solumedrol, sputum cultures, and chest xray received.  Pt complained of nose becoming dry, humidified O2 applied

## 2019-11-26 NOTE — Progress Notes (Signed)
PROGRESS NOTE    Heidi [name]  [id number] DOB: June 01, 1951 DOA: 11/23/2019 PCP: Ladell Pier, MD    No chief complaint on file.   Brief Narrative:  69 year old lady prior history of asthma, diastolic heart failure, hypertension, stage IIIa CKD, insulin-dependent diabetes mellitus, morbid obesity presents with shortness of breath and chest pain.  Chest x-ray shows hazy bilateral opacities in the lung bases concerning for atypical infectious process such as viral pneumonia.  CT angiogram of the chest ruled out PE, SARS Covid PCR test is negative.  She was admitted for multifocal pneumonia and was started on IV Rocephin and Zithromax. Pt reports her breathing is okay aslong as she is still and she gets dyspneic on ambulation or moving around.   Assessment & Plan:   Principal Problem:   Multifocal pneumonia Active Problems:   Thyroid nodule   Chest pain   Acute respiratory failure (HCC)   Breast nodule   Acute respiratory failure with hypoxia secondary to multifocal pneumonia Patient was started on IV Rocephin and Zithromax for the multifocal pneumonia. Nasal cannula oxygen to keep sats greater than 90%.  She is requiring up to 3 L per nasal cannula oxygen to keep sats greater than 90%.  Patient reports coughing, Robitussin added.  The sputum cultures are added. resp panel wnl.  COVID negative.  On exam today patient has diminished breath sounds throughout the lungs scattered wheezing.  She was started on low-dose Solu-Medrol.  Continue with Dulera at this time.   Thyroid nodule TSH wnl.  ultrasound thyroid reviewed.    Breast nodule on the CT angiogram recommend outpatient follow-up with mammogram  Uncontrolled diabetes mellitus with hyperglycemia CBG (last 3)  Recent Labs    11/25/19 1623 11/25/19 2114 11/26/19 0806  GLUCAP 135* 148* 104*   A1c is 10.5 % Continue with sliding scale insulin, NovoLog 70/30, 80 units in the morning and 50 units at  night.  Hyperlipidemia  Resume lipitor.    Mild AKI Unclear etiology Patient is not on any nephrotoxins. Check urine analysis and urine output and urine sodium and creatinine..  DVT prophylaxis: Lovenox Code Status: Full code Family Communication: None at bedside Disposition:   Status is: Inpatient  Remains inpatient appropriate because:IV treatments appropriate due to intensity of illness or inability to take PO   Dispo: The patient is from: Home              Anticipated d/c is to: Pending              Anticipated d/c date is: 1 day              Patient currently is not medically stable to d/c.        Consultants:   None  Procedures: None Antimicrobials: On Rocephin and Zithromax since admission.  Subjective: Patient denies any nausea vomiting or abdominal pain.  Reports breathing has improved but she is not back to baseline yet and is still requiring up to 3 L of nasal cannula oxygen.  Objective: Vitals:   11/25/19 2100 11/26/19 0743 11/26/19 0805 11/26/19 0805  BP: (!) 144/69  135/65 135/65  Pulse: (!) 102  98 98  Resp: 18  20 20   Temp: 98.2 F (36.8 C)  98.2 F (36.8 C) 98.2 F (36.8 C)  TempSrc: Oral     SpO2: 92% 93% 92% 92%  Weight:      Height:       No intake or output data in the 24 hours  ending 11/26/19 0951 Filed Weights   11/23/19 1704  Weight: 108.9 kg    Examination:  General exam: Discomfort from coughing on 3 L of nasal cannula oxygen. Respiratory system: Diminished air entry throughout the lungs, scattered wheezing heard, tachypnea present Cardiovascular system: S1-S2 heard, regular rate rhythm, no JVD, pedal edema present Gastrointestinal system: Abdomen is soft, nontender, nondistended bowel sounds normal Central nervous system: Alert and oriented, grossly nonfocal Extremities: No cyanosis or clubbing. Skin: No rashes seen Psychiatry: Mood is appropriate    Data Reviewed: I have personally reviewed following labs and  imaging studies  CBC: Recent Labs  Lab 11/23/19 1706  WBC 7.4  HGB 13.4  HCT 45.0  MCV 91.1  PLT [id number]    Basic Metabolic Panel: Recent Labs  Lab 11/23/19 1706 11/25/19 1103  NA 143 142  K 4.0 3.9  CL 97* 101  CO2 34* 30  GLUCOSE 115* 231*  BUN 10 12  CREATININE 1.00 1.15*  CALCIUM 9.2 9.1  MG  --  1.8    GFR: Estimated Creatinine Clearance: 56.2 mL/min (A) (by C-G formula based on SCr of 1.15 mg/dL (H)).  Liver Function Tests: No results for input(s): AST, ALT, ALKPHOS, BILITOT, PROT, ALBUMIN in the last 168 hours.  CBG: Recent Labs  Lab 11/25/19 0940 11/25/19 1156 11/25/19 1623 11/25/19 2114 11/26/19 0806  GLUCAP 211* 260* 135* 148* 104*     Recent Results (from the past 240 hour(s))  Blood culture (routine x 2)     Status: None (Preliminary result)   Collection Time: 11/24/19  2:36 AM   Specimen: BLOOD  Result Value Ref Range Status   Specimen Description BLOOD RIGHT ARM  Final   Special Requests   Final    BOTTLES DRAWN AEROBIC AND ANAEROBIC Blood Culture results may not be optimal due to an inadequate volume of blood received in culture bottles   Culture   Final    NO GROWTH 1 DAY Performed at [facility], [place] [address] [place], [place] [address]    Report Status PENDING  Incomplete  SARS Coronavirus 2 by RT PCR (hospital order, performed in [facility] hospital lab) Nasopharyngeal Nasopharyngeal Swab     Status: None   Collection Time: 11/24/19  2:39 AM   Specimen: Nasopharyngeal Swab  Result Value Ref Range Status   SARS Coronavirus 2 NEGATIVE NEGATIVE Final    Comment: (NOTE) SARS-CoV-2 target nucleic acids are NOT DETECTED. The SARS-CoV-2 RNA is generally detectable in upper and lower respiratory specimens during the acute phase of infection. The lowest concentration of SARS-CoV-2 viral copies this assay can detect is 250 copies / mL. A negative result does not preclude SARS-CoV-2 infection and should not be used as the sole basis  for treatment or other patient management decisions.  A negative result may occur with improper specimen collection / handling, submission of specimen other than nasopharyngeal swab, presence of viral mutation(s) within the areas targeted by this assay, and inadequate number of viral copies (<250 copies / mL). A negative result must be combined with clinical observations, patient history, and epidemiological information. Fact Sheet for Patients:   StrictlyIdeas.no Fact Sheet for Healthcare Providers: BankingDealers.co.za This test is not yet approved or cleared  by the [place] FDA and has been authorized for detection and/or diagnosis of SARS-CoV-2 by FDA under an Emergency Use Authorization (EUA).  This EUA will remain in effect (meaning this test can be used) for the duration of the COVID-19 declaration under Section  564(b)(1) of the Act, 21 U.S.C. section 360bbb-3(b)(1), unless the authorization is terminated or revoked sooner. Performed at [facility], [place] [address], [place], [place] [address]   Blood culture (routine x 2)     Status: None (Preliminary result)   Collection Time: 11/24/19  2:39 AM   Specimen: BLOOD  Result Value Ref Range Status   Specimen Description BLOOD RIGHT HAND  Final   Special Requests   Final    BOTTLES DRAWN AEROBIC AND ANAEROBIC Blood Culture results may not be optimal due to an inadequate volume of blood received in culture bottles   Culture   Final    NO GROWTH 1 DAY Performed at [facility], [place] [address], [place], [place] [address]    Report Status PENDING  Incomplete  Respiratory Panel by PCR     Status: None   Collection Time: 11/24/19  4:41 PM   Specimen: Nasopharyngeal Swab; Respiratory  Result Value Ref Range Status   Adenovirus NOT DETECTED NOT DETECTED Final   Coronavirus 229E NOT DETECTED NOT DETECTED Final    Comment: (NOTE) The Coronavirus on the Respiratory Panel,  DOES NOT test for the novel  Coronavirus (2019 nCoV)    Coronavirus HKU1 NOT DETECTED NOT DETECTED Final   Coronavirus NL63 NOT DETECTED NOT DETECTED Final   Coronavirus OC43 NOT DETECTED NOT DETECTED Final   Metapneumovirus NOT DETECTED NOT DETECTED Final   Rhinovirus / Enterovirus NOT DETECTED NOT DETECTED Final   Influenza A NOT DETECTED NOT DETECTED Final   Influenza B NOT DETECTED NOT DETECTED Final   Parainfluenza Virus 1 NOT DETECTED NOT DETECTED Final   Parainfluenza Virus 2 NOT DETECTED NOT DETECTED Final   Parainfluenza Virus 3 NOT DETECTED NOT DETECTED Final   Parainfluenza Virus 4 NOT DETECTED NOT DETECTED Final   Respiratory Syncytial Virus NOT DETECTED NOT DETECTED Final   Bordetella pertussis NOT DETECTED NOT DETECTED Final   Chlamydophila pneumoniae NOT DETECTED NOT DETECTED Final   Mycoplasma pneumoniae NOT DETECTED NOT DETECTED Final    Comment: Performed at [facility], [place]. [address] [place], [place] [address]         Radiology Studies: US THYROID  Result Date: 11/25/2019 CLINICAL DATA:  69 year old female with a history of prior thyroid nodule biopsy 2016 on the left EXAM: THYROID ULTRASOUND TECHNIQUE: Ultrasound examination of the thyroid gland and adjacent soft tissues was performed. COMPARISON:  01/04/2015, 01/31/2015 FINDINGS: Parenchymal Echotexture: Mildly heterogenous Isthmus: 0.7 cm Right lobe: 4.8 cm x 1.7 cm x 1.3 cm Left lobe: 4.1 cm x 2.0 cm x 1.2 cm _________________________________________________________ Estimated total number of nodules >/= 1 cm: 1 Number of spongiform nodules >/=  2 cm not described below (TR1): 0 Number of mixed cystic and solid nodules >/= 1.5 cm not described below (TR2): 0 _________________________________________________________ Nodule at the left inferior thyroid measures 1.6 cm, unchanged from the comparison ultrasound survey. By report this nodule has been previously biopsied. No adenopathy IMPRESSION: Similar  appearance of left inferior thyroid nodule. This nodule has been previously biopsied. Assuming a benign result, no further specific follow-up would be indicated. Recommendations follow those established by the new ACR TI-RADS criteria (J Am Coll Radiol A9880051). Electronically Signed   By: Corrie Mckusick D.O.   On: 11/25/2019 11:33        Scheduled Meds: . aspirin  81 mg Oral Daily  . atorvastatin  40 mg Oral Daily  . azithromycin  500 mg Oral Daily  .  enoxaparin (LOVENOX) injection  40 mg Subcutaneous Q24H  . insulin aspart  0-15 Units Subcutaneous TID WC  . insulin aspart protamine- aspart  50 Units Subcutaneous Q supper  . insulin aspart protamine- aspart  80 Units Subcutaneous Q breakfast  . methylPREDNISolone (SOLU-MEDROL) injection  40 mg Intravenous Q12H  . mometasone-formoterol  2 puff Inhalation BID  . montelukast  10 mg Oral Daily  . multivitamin with minerals  1 tablet Oral Daily  . olopatadine  1 drop Both Eyes BID  . pantoprazole  40 mg Oral Daily  . sodium chloride flush  3 mL Intravenous Once  . topiramate  75 mg Oral BID   Continuous Infusions: . cefTRIAXone (ROCEPHIN)  IV 1 g (11/26/19 0215)     LOS: 2 days       Hosie Poisson, MD [facility]   To contact the attending provider between 7A-7P or the covering provider during after hours 7P-7A, please log into the web site [email or web address] and access using universal [place] password for that web site. If you do not have the password, please call the hospital operator.  11/26/2019, 9:51 AM

## 2019-11-27 ENCOUNTER — Inpatient Hospital Stay (HOSPITAL_COMMUNITY): Payer: Medicare HMO

## 2019-11-27 LAB — BASIC METABOLIC PANEL
Anion gap: 11 (ref 5–15)
BUN: 16 mg/dL (ref 8–23)
CO2: 27 mmol/L (ref 22–32)
Calcium: 9.7 mg/dL (ref 8.9–10.3)
Chloride: 102 mmol/L (ref 98–111)
Creatinine, Ser: 1.18 mg/dL — ABNORMAL HIGH (ref 0.44–1.00)
GFR calc Af Amer: 55 mL/min — ABNORMAL LOW (ref >60)
GFR calc non Af Amer: 48 mL/min — ABNORMAL LOW (ref >60)
Glucose, Bld: 313 mg/dL — ABNORMAL HIGH (ref 70–99)
Potassium: 4.2 mmol/L (ref 3.5–5.1)
Sodium: 140 mmol/L (ref 135–145)

## 2019-11-27 LAB — CBC WITH DIFFERENTIAL/PLATELET
Abs Immature Granulocytes: 0.19 10*3/uL — ABNORMAL HIGH (ref 0.00–0.07)
Basophils Absolute: 0 10*3/uL (ref 0.0–0.1)
Basophils Relative: 0 %
Eosinophils Absolute: 0 10*3/uL (ref 0.0–0.5)
Eosinophils Relative: 0 %
HCT: 42.2 % (ref 36.0–46.0)
Hemoglobin: 12.7 g/dL (ref 12.0–15.0)
Immature Granulocytes: 2 %
Lymphocytes Relative: 13 %
Lymphs Abs: 1.1 10*3/uL (ref 0.7–4.0)
MCH: 27.3 pg (ref 26.0–34.0)
MCHC: 30.1 g/dL (ref 30.0–36.0)
MCV: 90.8 fL (ref 80.0–100.0)
Monocytes Absolute: 0.1 10*3/uL (ref 0.1–1.0)
Monocytes Relative: 2 %
Neutro Abs: 7 10*3/uL (ref 1.7–7.7)
Neutrophils Relative %: 83 %
Platelets: 209 10*3/uL (ref 150–400)
RBC: 4.65 MIL/uL (ref 3.87–5.11)
RDW: 14.9 % (ref 11.5–15.5)
WBC: 8.4 10*3/uL (ref 4.0–10.5)
nRBC: 0 % (ref 0.0–0.2)

## 2019-11-27 LAB — GLUCOSE, CAPILLARY
Glucose-Capillary: 309 mg/dL — ABNORMAL HIGH (ref 70–99)
Glucose-Capillary: 318 mg/dL — ABNORMAL HIGH (ref 70–99)
Glucose-Capillary: 321 mg/dL — ABNORMAL HIGH (ref 70–99)
Glucose-Capillary: 177 mg/dL — ABNORMAL HIGH (ref 70–99)

## 2019-11-27 LAB — URINALYSIS, ROUTINE W REFLEX MICROSCOPIC
Bilirubin Urine: NEGATIVE
Glucose, UA: 150 mg/dL — AB
Hgb urine dipstick: NEGATIVE
Ketones, ur: NEGATIVE mg/dL
Leukocytes,Ua: NEGATIVE
Nitrite: NEGATIVE
Protein, ur: NEGATIVE mg/dL
Specific Gravity, Urine: 1.02 (ref 1.005–1.030)
pH: 7 (ref 5.0–8.0)

## 2019-11-27 LAB — SODIUM, URINE, RANDOM: Sodium, Ur: 71 mmol/L

## 2019-11-27 LAB — CREATININE, URINE, RANDOM: Creatinine, Urine: 146 mg/dL

## 2019-11-27 MED ORDER — INSULIN ASPART 100 UNIT/ML ~~LOC~~ SOLN: 0.0000 [IU] | Freq: Every day | SUBCUTANEOUS | Status: DC

## 2019-11-27 MED ORDER — PREDNISONE 20 MG PO TABS
40.0000 mg | ORAL_TABLET | Freq: Every day | ORAL | Status: DC
  Administered 2019-11-28: 40 mg via ORAL
  Filled 2019-11-27: qty 2

## 2019-11-27 MED ORDER — INSULIN ASPART 100 UNIT/ML ~~LOC~~ SOLN
0.0000 [IU] | Freq: Three times a day (TID) | SUBCUTANEOUS | Status: DC
  Administered 2019-11-27: 15 [IU] via SUBCUTANEOUS
  Administered 2019-11-28: 7 [IU] via SUBCUTANEOUS
  Administered 2019-11-28: 3 [IU] via SUBCUTANEOUS
  Administered 2019-11-29: 7 [IU] via SUBCUTANEOUS
  Administered 2019-11-29 – 2019-11-30 (×2): 4 [IU] via SUBCUTANEOUS
  Administered 2019-12-01: 11 [IU] via SUBCUTANEOUS
  Administered 2019-12-01: 4 [IU] via SUBCUTANEOUS

## 2019-11-27 MED ORDER — FUROSEMIDE 10 MG/ML IJ SOLN
20.0000 mg | Freq: Once | INTRAMUSCULAR | Status: AC
  Administered 2019-11-27: 20 mg via INTRAVENOUS
  Filled 2019-11-27: qty 2

## 2019-11-27 MED ORDER — INSULIN ASPART 100 UNIT/ML ~~LOC~~ SOLN
5.0000 [IU] | Freq: Three times a day (TID) | SUBCUTANEOUS | Status: DC
  Administered 2019-11-27: 5 [IU] via SUBCUTANEOUS

## 2019-11-27 NOTE — Progress Notes (Signed)
PROGRESS NOTE    Heidi [name]  [id number] DOB: 29-Mar-1951 DOA: 11/23/2019 PCP: Ladell Pier, MD    No chief complaint on file.   Brief Narrative:  69 year old lady prior history of asthma, diastolic heart failure, hypertension, stage IIIa CKD, insulin-dependent diabetes mellitus, morbid obesity presents with shortness of breath and chest pain.  Chest x-ray shows hazy bilateral opacities in the lung bases concerning for atypical infectious process such as viral pneumonia.  CT angiogram of the chest ruled out PE, SARS Covid PCR test is negative.  She was admitted for multifocal pneumonia and was started on IV Rocephin and Zithromax. Pt seen and examined, personally checked her oxygen sats on RA and they have been around 98%. CXR this am shows interstitial edema.   Assessment & Plan:   Principal Problem:   Multifocal pneumonia Active Problems:   Thyroid nodule   Chest pain   Acute respiratory failure (HCC)   Breast nodule   Acute respiratory failure with hypoxia secondary to multifocal pneumonia Patient was started on IV Rocephin and Zithromax for the multifocal pneumonia. Much improved after starting low dose steroids. Oxygen sats on RA HAVE BEEN AROUND 96 to 98%.  resp panel wnl. Sputum cultures need to be recollected. Repeat CXR shows interstitial edema. One dose of IV LASIX ordered.  COVID negative.  Continue with Dulera. And stop antibiotics after tomorrow's dose.   Thyroid nodule TSH wnl.  ultrasound thyroid reviewed.    Breast nodule on the CT angiogram recommend outpatient follow-up with mammogram. Discussed with the patient.   Uncontrolled diabetes mellitus with hyperglycemia probably secondary to steroids.  CBG (last 3)  Recent Labs    11/26/19 2126 11/27/19 0803 11/27/19 1153  GLUCAP 174* 309* 318*   A1c is 10.5 % Continue with sliding scale insulin, NovoLog 70/30, 80 units in the morning and 50 units at night. Will add novolog TIDAC while on  steroids.  Change to resistance SSI.   Hyperlipidemia  Resume lipitor.    Mild AKI Unclear etiology Patient is not on any nephrotoxins. UA , is negative.  Improving creatinine.     DVT prophylaxis: Lovenox Code Status: Full code Family Communication: None at bedside Disposition:   Status is: Inpatient  Remains inpatient appropriate because:IV treatments appropriate due to intensity of illness or inability to take PO   Dispo: The patient is from: Home              Anticipated d/c is to: Home              Anticipated d/c date is: 1 day              Patient currently is not medically stable to d/c.        Consultants:   None  Procedures: None Antimicrobials: On Rocephin and Zithromax since admission.  Subjective: No chest pain , breathing has improved. No nausea, vomiting or abd pain or diarrhea.  No headache or dizziness.   Objective: Vitals:   11/26/19 2106 11/26/19 2110 11/27/19 0732 11/27/19 0804  BP:  (!) 119/52  136/65  Pulse:  95  94  Resp:  14  19  Temp:  98 F (36.7 C)  98 F (36.7 C)  TempSrc:      SpO2: 95% 97% 90% 93%  Weight:      Height:        Intake/Output Summary (Last 24 hours) at 11/27/2019 1157 Last data filed at 11/26/2019 2000 Gross per 24 hour  Intake 240  ml  Output --  Net 240 ml   Filed Weights   11/23/19 1704  Weight: 108.9 kg    Examination:  General exam: Alert and comfortable on room air, not in any kind of distress. Respiratory system: Air entry fair bilateral, no wheezing or rhonchi Cardiovascular system: S1-S2 heard, regular rate rhythm, no JVD, pedal edema present gastrointestinal system: Abdomen is soft, nontender, nondistended, bowel sounds normal Central nervous system: Alert and oriented, grossly nonfocal Extremities: Pedal edema, no cyanosis or clubbing Skin: No rashes seen Psychiatry: Mood is appropriate    Data Reviewed: I have personally reviewed following labs and imaging studies  CBC: Recent  Labs  Lab 11/23/19 1706 11/26/19 1028 11/27/19 0255  WBC 7.4 8.5 8.4  NEUTROABS  --  5.5 7.0  HGB 13.4 12.1 12.7  HCT 45.0 40.5 42.2  MCV 91.1 91.6 90.8  PLT 222 192 [id number]    Basic Metabolic Panel: Recent Labs  Lab 11/23/19 1706 11/25/19 1103 11/26/19 1028 11/27/19 0255  NA 143 142 140 140  K 4.0 3.9 3.9 4.2  CL 97* 101 103 102  CO2 34* 30 28 27   GLUCOSE 115* 231* 293* 313*  BUN 10 12 13 16   CREATININE 1.00 1.15* 1.23* 1.18*  CALCIUM 9.2 9.1 8.7* 9.7  MG  --  1.8  --   --     GFR: Estimated Creatinine Clearance: 54.8 mL/min (A) (by C-G formula based on SCr of 1.18 mg/dL (H)).  Liver Function Tests: No results for input(s): AST, ALT, ALKPHOS, BILITOT, PROT, ALBUMIN in the last 168 hours.  CBG: Recent Labs  Lab 11/26/19 1549 11/26/19 2105 11/26/19 2126 11/27/19 0803 11/27/19 1153  GLUCAP 175* 176* 174* 309* 318*     Recent Results (from the past 240 hour(s))  Blood culture (routine x 2)     Status: None (Preliminary result)   Collection Time: 11/24/19  2:36 AM   Specimen: BLOOD  Result Value Ref Range Status   Specimen Description BLOOD RIGHT ARM  Final   Special Requests   Final    BOTTLES DRAWN AEROBIC AND ANAEROBIC Blood Culture results may not be optimal due to an inadequate volume of blood received in culture bottles   Culture   Final    NO GROWTH 3 DAYS Performed at [facility], [place] [address] [place], [place] [address]    Report Status PENDING  Incomplete  SARS Coronavirus 2 by RT PCR (hospital order, performed in [facility] hospital lab) Nasopharyngeal Nasopharyngeal Swab     Status: None   Collection Time: 11/24/19  2:39 AM   Specimen: Nasopharyngeal Swab  Result Value Ref Range Status   SARS Coronavirus 2 NEGATIVE NEGATIVE Final    Comment: (NOTE) SARS-CoV-2 target nucleic acids are NOT DETECTED. The SARS-CoV-2 RNA is generally detectable in upper and lower respiratory specimens during the acute phase of infection. The  lowest concentration of SARS-CoV-2 viral copies this assay can detect is 250 copies / mL. A negative result does not preclude SARS-CoV-2 infection and should not be used as the sole basis for treatment or other patient management decisions.  A negative result may occur with improper specimen collection / handling, submission of specimen other than nasopharyngeal swab, presence of viral mutation(s) within the areas targeted by this assay, and inadequate number of viral copies (<250 copies / mL). A negative result must be combined with clinical observations, patient history, and epidemiological information. Fact Sheet for Patients:   StrictlyIdeas.no Fact Sheet for Healthcare Providers:  BankingDealers.co.za This test is not yet approved or cleared  by the [place] and has been authorized for detection and/or diagnosis of SARS-CoV-2 by FDA under an Emergency Use Authorization (EUA).  This EUA will remain in effect (meaning this test can be used) for the duration of the COVID-19 declaration under Section 564(b)(1) of the Act, 21 U.S.C. section 360bbb-3(b)(1), unless the authorization is terminated or revoked sooner. Performed at [facility], [place] [address] [place], [place] [address]   Blood culture (routine x 2)     Status: None (Preliminary result)   Collection Time: 11/24/19  2:39 AM   Specimen: BLOOD  Result Value Ref Range Status   Specimen Description BLOOD RIGHT HAND  Final   Special Requests   Final    BOTTLES DRAWN AEROBIC AND ANAEROBIC Blood Culture results may not be optimal due to an inadequate volume of blood received in culture bottles   Culture   Final    NO GROWTH 3 DAYS Performed at [facility], [place] [address], [place], [place] [address]    Report Status PENDING  Incomplete  Respiratory Panel by PCR     Status: None   Collection Time: 11/24/19  4:41 PM   Specimen: Nasopharyngeal Swab; Respiratory   Result Value Ref Range Status   Adenovirus NOT DETECTED NOT DETECTED Final   Coronavirus 229E NOT DETECTED NOT DETECTED Final    Comment: (NOTE) The Coronavirus on the Respiratory Panel, DOES NOT test for the novel  Coronavirus (2019 nCoV)    Coronavirus HKU1 NOT DETECTED NOT DETECTED Final   Coronavirus NL63 NOT DETECTED NOT DETECTED Final   Coronavirus OC43 NOT DETECTED NOT DETECTED Final   Metapneumovirus NOT DETECTED NOT DETECTED Final   Rhinovirus / Enterovirus NOT DETECTED NOT DETECTED Final   Influenza A NOT DETECTED NOT DETECTED Final   Influenza B NOT DETECTED NOT DETECTED Final   Parainfluenza Virus 1 NOT DETECTED NOT DETECTED Final   Parainfluenza Virus 2 NOT DETECTED NOT DETECTED Final   Parainfluenza Virus 3 NOT DETECTED NOT DETECTED Final   Parainfluenza Virus 4 NOT DETECTED NOT DETECTED Final   Respiratory Syncytial Virus NOT DETECTED NOT DETECTED Final   Bordetella pertussis NOT DETECTED NOT DETECTED Final   Chlamydophila pneumoniae NOT DETECTED NOT DETECTED Final   Mycoplasma pneumoniae NOT DETECTED NOT DETECTED Final    Comment: Performed at [facility], [place]. [address], [place], [place] [address]  Expectorated sputum assessment w rflx to resp cult     Status: None   Collection Time: 11/26/19 11:30 AM   Specimen: Expectorated Sputum  Result Value Ref Range Status   Specimen Description EXPECTORATED SPUTUM  Final   Special Requests NONE  Final   Sputum evaluation   Final    Sputum specimen not acceptable for testing.  Please recollect.   Gram Stain Report Called to,Read Back By and Verified With: RN D ODELL AT 1929 11/26/19 BY L BENFIELD Performed at [facility], [place] [address] [place], [place] [address]    Report Status 11/26/2019 FINAL  Final         Radiology Studies: DG Chest 2 View  Result Date: 11/27/2019 CLINICAL DATA:  Shortness of breath EXAM: CHEST - 2 VIEW COMPARISON:  11/23/2019 FINDINGS: Heart is borderline in size. Mild  interstitial prominence and bibasilar opacities. No effusions. No acute bony abnormality. IMPRESSION: Mild interstitial prominence could reflect interstitial edema. Bibasilar atelectasis. Electronically Signed   By: Rolm Baptise  M.D.   On: 11/27/2019 07:38        Scheduled Meds: . aspirin  81 mg Oral Daily  . atorvastatin  40 mg Oral Daily  . azithromycin  500 mg Oral Daily  . enoxaparin (LOVENOX) injection  40 mg Subcutaneous Q24H  . furosemide  20 mg Intravenous Once  . insulin aspart  0-15 Units Subcutaneous TID WC  . insulin aspart protamine- aspart  50 Units Subcutaneous Q supper  . insulin aspart protamine- aspart  80 Units Subcutaneous Q breakfast  . mometasone-formoterol  2 puff Inhalation BID  . montelukast  10 mg Oral Daily  . multivitamin with minerals  1 tablet Oral Daily  . olopatadine  1 drop Both Eyes BID  . pantoprazole  40 mg Oral Daily  . [START ON 11/28/2019] predniSONE  40 mg Oral QAC breakfast  . sodium chloride flush  3 mL Intravenous Once  . topiramate  75 mg Oral BID   Continuous Infusions: . cefTRIAXone (ROCEPHIN)  IV 1 g (11/27/19 0122)     LOS: 3 days       Hosie Poisson, MD [facility]   To contact the attending provider between 7A-7P or the covering provider during after hours 7P-7A, please log into the web site [email or web address] and access using universal  password for that web site. If you do not have the password, please call the hospital operator.  11/27/2019, 11:57 AM

## 2019-11-27 NOTE — Progress Notes (Signed)
Inpatient Diabetes Program Recommendations  AACE/ADA: New Consensus Statement on Inpatient Glycemic Control (2015)  Target Ranges:  Prepandial:   less than 140 mg/dL      Peak postprandial:   less than 180 mg/dL (1-2 hours)      Critically ill patients:  140 - 180 mg/dL   Lab Results  Component Value Date   GLUCAP 318 (H) 11/27/2019   HGBA1C 10.5 (H) 11/13/2019    Review of Glycemic Control Results for Heidi [name], Heidi [name] (MRN [id number]) as of 11/27/2019 14:10  Ref. Range 11/26/2019 08:06 11/26/2019 12:03 11/26/2019 15:49 11/26/2019 21:05 11/26/2019 21:26 11/27/2019 08:03 11/27/2019 11:53  Glucose-Capillary Latest Ref Range: 70 - 99 mg/dL 104 (H) 248 (H) 175 (H) 176 (H) 174 (H) 309 (H) 318 (H)   Diabetes history: DM 2 Outpatient Diabetes medications:  Novolog 70/30 80 units in the Am and 50 units in the PM Current orders for Inpatient glycemic control:  Novolog mix 70/30 80 units with breakfast and Novolog 70/30 50 units with dinner Novolog resistant tid with meals and HS Prednisone 40 mg daily (starts on 5/25) Novolog 5 units tid with meals  Inpatient Diabetes Program Recommendations:    Please consider d/c of Novolog meal coverage since 70/30 has meal coverage in mixture.   Patient did not receive AM dose of 70/30 on 5/23 which could have influenced increase in blood sugars as well.  Steroids tapering.    Will follow.  Thanks,  Adah Perl, RN, BC-ADM Inpatient Diabetes Coordinator Pager [phone] (8a-5p)

## 2019-11-28 LAB — CBC WITH DIFFERENTIAL/PLATELET
Abs Immature Granulocytes: 0.15 10*3/uL — ABNORMAL HIGH (ref 0.00–0.07)
Basophils Absolute: 0 10*3/uL (ref 0.0–0.1)
Basophils Relative: 0 %
Eosinophils Absolute: 0.1 10*3/uL (ref 0.0–0.5)
Eosinophils Relative: 1 %
HCT: 43 % (ref 36.0–46.0)
Hemoglobin: 13.1 g/dL (ref 12.0–15.0)
Immature Granulocytes: 1 %
Lymphocytes Relative: 26 %
Lymphs Abs: 2.9 10*3/uL (ref 0.7–4.0)
MCH: 27.3 pg (ref 26.0–34.0)
MCHC: 30.5 g/dL (ref 30.0–36.0)
MCV: 89.6 fL (ref 80.0–100.0)
Monocytes Absolute: 0.9 10*3/uL (ref 0.1–1.0)
Monocytes Relative: 8 %
Neutro Abs: 7.1 10*3/uL (ref 1.7–7.7)
Neutrophils Relative %: 64 %
Platelets: 229 10*3/uL (ref 150–400)
RBC: 4.8 MIL/uL (ref 3.87–5.11)
RDW: 14.9 % (ref 11.5–15.5)
WBC: 11.2 10*3/uL — ABNORMAL HIGH (ref 4.0–10.5)
nRBC: 0 % (ref 0.0–0.2)

## 2019-11-28 LAB — BASIC METABOLIC PANEL
Anion gap: 12 (ref 5–15)
BUN: 23 mg/dL (ref 8–23)
CO2: 28 mmol/L (ref 22–32)
Calcium: 9.7 mg/dL (ref 8.9–10.3)
Chloride: 104 mmol/L (ref 98–111)
Creatinine, Ser: 1.16 mg/dL — ABNORMAL HIGH (ref 0.44–1.00)
GFR calc Af Amer: 56 mL/min — ABNORMAL LOW (ref >60)
GFR calc non Af Amer: 49 mL/min — ABNORMAL LOW (ref >60)
Glucose, Bld: 68 mg/dL — ABNORMAL LOW (ref 70–99)
Potassium: 3.9 mmol/L (ref 3.5–5.1)
Sodium: 144 mmol/L (ref 135–145)

## 2019-11-28 LAB — GLUCOSE, CAPILLARY
Glucose-Capillary: 117 mg/dL — ABNORMAL HIGH (ref 70–99)
Glucose-Capillary: 82 mg/dL (ref 70–99)
Glucose-Capillary: 167 mg/dL — ABNORMAL HIGH (ref 70–99)
Glucose-Capillary: 228 mg/dL — ABNORMAL HIGH (ref 70–99)
Glucose-Capillary: 134 mg/dL — ABNORMAL HIGH (ref 70–99)
Glucose-Capillary: 117 mg/dL — ABNORMAL HIGH (ref 70–99)
Glucose-Capillary: 84 mg/dL (ref 70–99)

## 2019-11-28 MED ORDER — FUROSEMIDE 10 MG/ML IJ SOLN
40.0000 mg | Freq: Once | INTRAMUSCULAR | Status: AC
  Administered 2019-11-28: 40 mg via INTRAVENOUS
  Filled 2019-11-28: qty 4

## 2019-11-28 NOTE — Progress Notes (Signed)
PROGRESS NOTE    Heidi [name]  [id number] DOB: Sep 05, 1950 DOA: 11/23/2019 PCP: Ladell Pier, MD    No chief complaint on file.   Brief Narrative:  69 year old lady prior history of asthma, diastolic heart failure, hypertension, stage IIIa CKD, insulin-dependent diabetes mellitus, morbid obesity presents with shortness of breath and chest pain.  Chest x-ray shows hazy bilateral opacities in the lung bases concerning for atypical infectious process such as viral pneumonia.  CT angiogram of the chest ruled out PE, SARS Covid PCR test is negative.  She was admitted for multifocal pneumonia and was started on IV Rocephin and Zithromax. She has completed 5 days of IV antibiotics. CXR on 5/24 shows interstitial edema, started on her IV lasix. She was weaned off oxygen.  Patient seen and examined at bedside, reports that she doesn't want to be discharged yet,.   Assessment & Plan:   Principal Problem:   Multifocal pneumonia Active Problems:   Thyroid nodule   Chest pain   Acute respiratory failure (HCC)   Breast nodule   Acute respiratory failure with hypoxia secondary to multifocal pneumonia  Patient was started on IV Rocephin and Zithromax for the multifocal pneumonia.  Completed 5 days of antibiotics today. Breathing has improved and she was weaned off oxygen. resp panel wnl.  COVID-19 negative.  Sputum cultures need to be recollected.   She has an appt with PCCM on 5/27 for follow up .    ?  Mild acute diastolic heart failure BNP is 19.7, but she has pedal edema and interstitial edema on CXR.  Trial with IV lasix for 48 hours and see if her sob improved.  Currently she is on RA with sats in upper 90's.   Thyroid nodule TSH wnl.  ultrasound thyroid reviewed.    Breast nodule on the CT angiogram recommend outpatient follow-up with mammogram. Discussed with the patient.   Uncontrolled diabetes mellitus with hyperglycemia probably secondary to steroids.  CBG (last 3)   Recent Labs    11/28/19 0754 11/28/19 0911 11/28/19 1211  GLUCAP 82 167* 228*   A1c is 10.5 % Continue with sliding scale insulin, NovoLog 70/30, 80 units in the morning and 50 units at night. Change to resistance SSI.   Hyperlipidemia  Resume lipitor.    Mild AKI Unclear etiology Patient is not on any nephrotoxins. UA , is negative.  Improving creatinine.     DVT prophylaxis: Lovenox Code Status: Full code Family Communication: None at bedside Disposition:   Status is: Inpatient  Remains inpatient appropriate because:IV treatments appropriate due to intensity of illness or inability to take PO   Dispo: The patient is from: Home              Anticipated d/c is to: Home              Anticipated d/c date is: 1 day              Patient currently is not medically stable to d/c.        Consultants:   None  Procedures: None Antimicrobials: On Rocephin and Zithromax since admission. Day 5 of antibiotics.   Subjective: No new complaints today. Does not want to be discharged today.   Objective: Vitals:   11/27/19 1524 11/27/19 2321 11/28/19 0757 11/28/19 0811  BP: (!) 128/99 (!) 113/52  135/72  Pulse: 91 76  88  Resp: 19     Temp: 98.2 F (36.8 C) (!) 97.4 F (36.3 C)  97.7  F (36.5 C)  TempSrc:    Axillary  SpO2: 96% 97% 90% 96%  Weight:      Height:       No intake or output data in the 24 hours ending 11/28/19 1332 Filed Weights   11/23/19 1704  Weight: 108.9 kg    Examination:  General exam: Alert and comfortable, not in any kind of distress, on room air. Respiratory system: Air entry fair bilateral, no wheezing or rhonchi, no tachypnea present cardiovascular system: S1-S2 heard, regular rate rhythm, no JVD, pedal edema present gastrointestinal system: Abdomen is soft, nontender, nondistended, bowel sounds normal Central nervous system: Alert and oriented, grossly nonfocal Extremities: Pedal edema present, no cyanosis or clubbing Skin: No  rashes evident Psychiatry: Mood is appropriate    Data Reviewed: I have personally reviewed following labs and imaging studies  CBC: Recent Labs  Lab 11/23/19 1706 11/26/19 1028 11/27/19 0255 11/28/19 0316  WBC 7.4 8.5 8.4 11.2*  NEUTROABS  --  5.5 7.0 7.1  HGB 13.4 12.1 12.7 13.1  HCT 45.0 40.5 42.2 43.0  MCV 91.1 91.6 90.8 89.6  PLT 222 192 209 [id number]    Basic Metabolic Panel: Recent Labs  Lab 11/23/19 1706 11/25/19 1103 11/26/19 1028 11/27/19 0255 11/28/19 0316  NA 143 142 140 140 144  K 4.0 3.9 3.9 4.2 3.9  CL 97* 101 103 102 104  CO2 34* 30 28 27 28   GLUCOSE 115* 231* 293* 313* 68*  BUN 10 12 13 16 23   CREATININE 1.00 1.15* 1.23* 1.18* 1.16*  CALCIUM 9.2 9.1 8.7* 9.7 9.7  MG  --  1.8  --   --   --     GFR: Estimated Creatinine Clearance: 55.7 mL/min (A) (by C-G formula based on SCr of 1.16 mg/dL (H)).  Liver Function Tests: No results for input(s): AST, ALT, ALKPHOS, BILITOT, PROT, ALBUMIN in the last 168 hours.  CBG: Recent Labs  Lab 11/27/19 2106 11/28/19 0536 11/28/19 0754 11/28/19 0911 11/28/19 1211  GLUCAP 177* 117* 82 167* 228*     Recent Results (from the past 240 hour(s))  Blood culture (routine x 2)     Status: None (Preliminary result)   Collection Time: 11/24/19  2:36 AM   Specimen: BLOOD  Result Value Ref Range Status   Specimen Description BLOOD RIGHT ARM  Final   Special Requests   Final    BOTTLES DRAWN AEROBIC AND ANAEROBIC Blood Culture results may not be optimal due to an inadequate volume of blood received in culture bottles   Culture   Final    NO GROWTH 3 DAYS Performed at [facility], [place] [address], [place], [place] [address]    Report Status PENDING  Incomplete  SARS Coronavirus 2 by RT PCR (hospital order, performed in [facility] hospital lab) Nasopharyngeal Nasopharyngeal Swab     Status: None   Collection Time: 11/24/19  2:39 AM   Specimen: Nasopharyngeal Swab  Result Value Ref Range Status   SARS  Coronavirus 2 NEGATIVE NEGATIVE Final    Comment: (NOTE) SARS-CoV-2 target nucleic acids are NOT DETECTED. The SARS-CoV-2 RNA is generally detectable in upper and lower respiratory specimens during the acute phase of infection. The lowest concentration of SARS-CoV-2 viral copies this assay can detect is 250 copies / mL. A negative result does not preclude SARS-CoV-2 infection and should not be used as the sole basis for treatment or other patient management decisions.  A negative result may occur with improper specimen collection /  handling, submission of specimen other than nasopharyngeal swab, presence of viral mutation(s) within the areas targeted by this assay, and inadequate number of viral copies (<250 copies / mL). A negative result must be combined with clinical observations, patient history, and epidemiological information. Fact Sheet for Patients:   StrictlyIdeas.no Fact Sheet for Healthcare Providers: BankingDealers.co.za This test is not yet approved or cleared  by the [place] FDA and has been authorized for detection and/or diagnosis of SARS-CoV-2 by FDA under an Emergency Use Authorization (EUA).  This EUA will remain in effect (meaning this test can be used) for the duration of the COVID-19 declaration under Section 564(b)(1) of the Act, 21 U.S.C. section 360bbb-3(b)(1), unless the authorization is terminated or revoked sooner. Performed at [facility], [place] [address], [place], [place] [address]   Blood culture (routine x 2)     Status: None (Preliminary result)   Collection Time: 11/24/19  2:39 AM   Specimen: BLOOD  Result Value Ref Range Status   Specimen Description BLOOD RIGHT HAND  Final   Special Requests   Final    BOTTLES DRAWN AEROBIC AND ANAEROBIC Blood Culture results may not be optimal due to an inadequate volume of blood received in culture bottles   Culture   Final    NO GROWTH 3  DAYS Performed at [facility], [place] [address] [place], [place] [address]    Report Status PENDING  Incomplete  Respiratory Panel by PCR     Status: None   Collection Time: 11/24/19  4:41 PM   Specimen: Nasopharyngeal Swab; Respiratory  Result Value Ref Range Status   Adenovirus NOT DETECTED NOT DETECTED Final   Coronavirus 229E NOT DETECTED NOT DETECTED Final    Comment: (NOTE) The Coronavirus on the Respiratory Panel, DOES NOT test for the novel  Coronavirus (2019 nCoV)    Coronavirus HKU1 NOT DETECTED NOT DETECTED Final   Coronavirus NL63 NOT DETECTED NOT DETECTED Final   Coronavirus OC43 NOT DETECTED NOT DETECTED Final   Metapneumovirus NOT DETECTED NOT DETECTED Final   Rhinovirus / Enterovirus NOT DETECTED NOT DETECTED Final   Influenza A NOT DETECTED NOT DETECTED Final   Influenza B NOT DETECTED NOT DETECTED Final   Parainfluenza Virus 1 NOT DETECTED NOT DETECTED Final   Parainfluenza Virus 2 NOT DETECTED NOT DETECTED Final   Parainfluenza Virus 3 NOT DETECTED NOT DETECTED Final   Parainfluenza Virus 4 NOT DETECTED NOT DETECTED Final   Respiratory Syncytial Virus NOT DETECTED NOT DETECTED Final   Bordetella pertussis NOT DETECTED NOT DETECTED Final   Chlamydophila pneumoniae NOT DETECTED NOT DETECTED Final   Mycoplasma pneumoniae NOT DETECTED NOT DETECTED Final    Comment: Performed at [facility], [place]. [address] [place],  [address]  Expectorated sputum assessment w rflx to resp cult     Status: None   Collection Time: 11/26/19 11:30 AM   Specimen: Expectorated Sputum  Result Value Ref Range Status   Specimen Description EXPECTORATED SPUTUM  Final   Special Requests NONE  Final   Sputum evaluation   Final    Sputum specimen not acceptable for testing.  Please recollect.   Gram Stain Report Called to,Read Back By and Verified With: RN D ODELL AT 1929 11/26/19 BY L BENFIELD Performed at [facility], [place] [address], [place],  [address]     Report Status 11/26/2019 FINAL  Final         Radiology Studies: DG Chest 2  View  Result Date: 11/27/2019 CLINICAL DATA:  Shortness of breath EXAM: CHEST - 2 VIEW COMPARISON:  11/23/2019 FINDINGS: Heart is borderline in size. Mild interstitial prominence and bibasilar opacities. No effusions. No acute bony abnormality. IMPRESSION: Mild interstitial prominence could reflect interstitial edema. Bibasilar atelectasis. Electronically Signed   By: Rolm Baptise M.D.   On: 11/27/2019 07:38        Scheduled Meds: . aspirin  81 mg Oral Daily  . atorvastatin  40 mg Oral Daily  . azithromycin  500 mg Oral Daily  . enoxaparin (LOVENOX) injection  40 mg Subcutaneous Q24H  . insulin aspart  0-20 Units Subcutaneous TID WC  . insulin aspart  0-5 Units Subcutaneous QHS  . insulin aspart protamine- aspart  50 Units Subcutaneous Q supper  . insulin aspart protamine- aspart  80 Units Subcutaneous Q breakfast  . mometasone-formoterol  2 puff Inhalation BID  . montelukast  10 mg Oral Daily  . multivitamin with minerals  1 tablet Oral Daily  . olopatadine  1 drop Both Eyes BID  . pantoprazole  40 mg Oral Daily  . predniSONE  40 mg Oral QAC breakfast  . sodium chloride flush  3 mL Intravenous Once  . topiramate  75 mg Oral BID   Continuous Infusions: . cefTRIAXone (ROCEPHIN)  IV 1 g (11/28/19 0135)     LOS: 4 days       Hosie Poisson, MD [facility]   To contact the attending provider between 7A-7P or the covering provider during after hours 7P-7A, please log into the web site [email or web address] and access using universal [place] password for that web site. If you do not have the password, please call the hospital operator.  11/28/2019, 1:32 PM

## 2019-11-28 NOTE — Progress Notes (Signed)
CSW alerted by RN that patient requested to speak to CSW. CSW met with patient, who asked about being set up with Medicaid. CSW explained to patient that she needs to reach out to [place] for [facility] assistance. CSW to provide contact information for patient to reach out when she leaves the hospital to start Medicaid application process.  Laveda Abbe, [place] Clinical Social Worker [phone]

## 2019-11-28 NOTE — Plan of Care (Signed)

## 2019-11-29 DIAGNOSIS — I5031 Acute diastolic (congestive) heart failure: Secondary | ICD-10-CM

## 2019-11-29 LAB — BASIC METABOLIC PANEL
Anion gap: 9 (ref 5–15)
BUN: 22 mg/dL (ref 8–23)
CO2: 30 mmol/L (ref 22–32)
Calcium: 9 mg/dL (ref 8.9–10.3)
Chloride: 101 mmol/L (ref 98–111)
Creatinine, Ser: 1.17 mg/dL — ABNORMAL HIGH (ref 0.44–1.00)
GFR calc Af Amer: 56 mL/min — ABNORMAL LOW (ref >60)
GFR calc non Af Amer: 48 mL/min — ABNORMAL LOW (ref >60)
Glucose, Bld: 170 mg/dL — ABNORMAL HIGH (ref 70–99)
Potassium: 3.3 mmol/L — ABNORMAL LOW (ref 3.5–5.1)
Sodium: 140 mmol/L (ref 135–145)

## 2019-11-29 LAB — CULTURE, BLOOD (ROUTINE X 2)
Culture: NO GROWTH
Culture: NO GROWTH

## 2019-11-29 LAB — GLUCOSE, CAPILLARY
Glucose-Capillary: 63 mg/dL — ABNORMAL LOW (ref 70–99)
Glucose-Capillary: 162 mg/dL — ABNORMAL HIGH (ref 70–99)
Glucose-Capillary: 176 mg/dL — ABNORMAL HIGH (ref 70–99)
Glucose-Capillary: 224 mg/dL — ABNORMAL HIGH (ref 70–99)
Glucose-Capillary: 132 mg/dL — ABNORMAL HIGH (ref 70–99)

## 2019-11-29 MED ORDER — HYDROCODONE-ACETAMINOPHEN 5-325 MG PO TABS
1.0000 | ORAL_TABLET | Freq: Four times a day (QID) | ORAL | Status: DC | PRN
  Administered 2019-11-29 – 2019-12-04 (×15): 1 via ORAL
  Filled 2019-11-29 (×15): qty 1

## 2019-11-29 MED ORDER — INSULIN ASPART PROT & ASPART (70-30 MIX) 100 UNIT/ML ~~LOC~~ SUSP
75.0000 [IU] | Freq: Every day | SUBCUTANEOUS | Status: DC
  Administered 2019-11-30 – 2019-12-01 (×2): 75 [IU] via SUBCUTANEOUS
  Filled 2019-11-29: qty 10

## 2019-11-29 MED ORDER — POTASSIUM CHLORIDE CRYS ER 20 MEQ PO TBCR
40.0000 meq | EXTENDED_RELEASE_TABLET | Freq: Two times a day (BID) | ORAL | Status: AC
  Administered 2019-11-29 (×2): 40 meq via ORAL
  Filled 2019-11-29 (×2): qty 2

## 2019-11-29 MED ORDER — INSULIN ASPART PROT & ASPART (70-30 MIX) 100 UNIT/ML ~~LOC~~ SUSP
45.0000 [IU] | Freq: Every day | SUBCUTANEOUS | Status: DC
  Administered 2019-11-29 – 2019-12-03 (×4): 45 [IU] via SUBCUTANEOUS

## 2019-11-29 MED ORDER — FUROSEMIDE 10 MG/ML IJ SOLN
40.0000 mg | Freq: Two times a day (BID) | INTRAMUSCULAR | Status: DC
  Administered 2019-11-29 – 2019-11-30 (×4): 40 mg via INTRAVENOUS
  Filled 2019-11-29 (×4): qty 4

## 2019-11-29 NOTE — Progress Notes (Signed)
Inpatient Diabetes Program Recommendations  AACE/ADA: New Consensus Statement on Inpatient Glycemic Control (2015)  Target Ranges:  Prepandial:   less than 140 mg/dL      Peak postprandial:   less than 180 mg/dL (1-2 hours)      Critically ill patients:  140 - 180 mg/dL   Lab Results  Component Value Date   GLUCAP 63 (L) 11/29/2019   HGBA1C 10.5 (H) 11/13/2019    Review of Glycemic Control Results for Heidi [name], Heidi [name] (MRN [id number]) as of 11/29/2019 08:20  Ref. Range 11/28/2019 05:36 11/28/2019 07:54 11/28/2019 09:11 11/28/2019 12:11 11/28/2019 15:43 11/28/2019 18:52 11/28/2019 21:13 11/29/2019 08:08  Glucose-Capillary Latest Ref Range: 70 - 99 mg/dL 117 (H) 82 167 (H) 228 (H) 134 (H) 117 (H) 84 63 (L)   Diabetes history: DM 2 Outpatient Diabetes medications:  Novolog 70/30 80 units in the AM and 50 units q PM Current orders for Inpatient glycemic control:  Novolog 70/30 80 units in the AM and 50 units q PM Novolog resistant tid with meals and HS Inpatient Diabetes Program Recommendations:    Please consider holding Novolog correction.  Also consider reducing 70/30 to 75 units in the AM and 45 units in the PM.   Thanks,  Adah Perl, RN, BC-ADM Inpatient Diabetes Coordinator Pager [phone] (8a-5p)

## 2019-11-29 NOTE — Progress Notes (Signed)
PROGRESS NOTE    Heidi [name]  [id number] DOB: Nov 27, 1950 DOA: 11/23/2019 PCP: Ladell Pier, MD    Brief Narrative:  69 year old lady prior history of asthma, diastolic heart failure, hypertension, stage IIIa CKD, insulin-dependent diabetes mellitus, morbid obesity presents with shortness of breath and chest pain.  Chest x-ray shows hazy bilateral opacities in the lung bases concerning for atypical infectious process such as viral pneumonia.  CT angiogram of the chest ruled out PE, SARS Covid PCR test is negative.  She was admitted for multifocal pneumonia and was started on IV Rocephin and Zithromax. She has completed 5 days of IV antibiotics. CXR on 5/24 shows interstitial edema, started on her IV lasix. She was weaned off oxygen.     Subjective: Patient states that she is feeling slightly better.  Less short of breath.  Still has significant lower extremity swelling.  Assessment & Plan:   Acute respiratory failure with hypoxia secondary to multifocal pneumonia Patient was started on IV Rocephin and Zithromax for the multifocal pneumonia.  She completed 5 days of antibiotics.  Shortness of breath is improved.  It looks like she was weaned off of oxygen yesterday.  Noted to be on 2 L/min today.  Continue to wean off possible.  Check ambulatory pulse ox.  Cultures negative so far.  She has an appt with PCCM on 5/27 for follow up.  Mild acute diastolic heart failure Patient had significant lower extremity edema as well as interstitial edema on chest x-ray.  She was given IV Lasix.  She has noted some improvement in her symptoms.  Give Lasix for 24 more hours.  She does not take her Lasix at home.  She will need a prescription for same.     Thyroid nodule TSH wnl.  Per ultrasound report apparently this lesion has been previously biopsied was noted to be benign.  Breast nodule on the CT angiogram recommend outpatient follow-up with mammogram.  Last mammogram done in September 2019  did not show any abnormalities. Discussed with the patient.   Uncontrolled diabetes mellitus with hyperglycemia probably secondary to steroids.  A1c is 10.5 %.  And with brittle control of diabetes.  Noted to be hypoglycemic this morning.  Dose of her 70/30 insulin has been adjusted.  Continue SSI.  Hyperlipidemia  Resume lipitor.   Mild AKI Likely due to mild dehydration.  Now resolved.    DVT prophylaxis: Lovenox Code Status: Full code Family Communication: None at bedside Disposition:   Status is: Inpatient  Remains inpatient appropriate because:IV treatments appropriate due to intensity of illness or inability to take PO need for IV diuretics.   Dispo: The patient is from: Home              Anticipated d/c is to: Home              Anticipated d/c date is: May 27              Patient currently is not medically stable to d/c.        Consultants:   None  Procedures: None Antimicrobials: On Rocephin and Zithromax since admission. Day 5 of antibiotics.     Objective: Vitals:   11/28/19 1505 11/28/19 2231 11/29/19 0731 11/29/19 0809  BP: (!) 112/58 132/69  124/70  Pulse: 91 83  80  Resp: 20   19  Temp: (!) 97.5 F (36.4 C) 98.1 F (36.7 C)  (!) 97.4 F (36.3 C)  TempSrc: Oral     SpO2:  92% 91% 92% 95%  Weight:      Height:       No intake or output data in the 24 hours ending 11/29/19 1229 Filed Weights   11/23/19 1704  Weight: 108.9 kg    Examination:  General appearance: Awake alert.  In no distress Resp: Normal effort.  Few crackles at the bases.  No wheezing or rhonchi.   Cardio: S1-S2 is normal regular.  No S3-S4.  No rubs murmurs or bruit GI: Abdomen is soft.  Nontender nondistended.  Bowel sounds are present normal.  No masses organomegaly Extremities: 2+ edema bilateral lower extremities. Neurologic: Alert and oriented x3.  No focal neurological deficits.      Data Reviewed: I have personally reviewed following labs and imaging  studies  CBC: Recent Labs  Lab 11/23/19 1706 11/26/19 1028 11/27/19 0255 11/28/19 0316  WBC 7.4 8.5 8.4 11.2*  NEUTROABS  --  5.5 7.0 7.1  HGB 13.4 12.1 12.7 13.1  HCT 45.0 40.5 42.2 43.0  MCV 91.1 91.6 90.8 89.6  PLT 222 192 209 [id number]    Basic Metabolic Panel: Recent Labs  Lab 11/25/19 1103 11/26/19 1028 11/27/19 0255 11/28/19 0316 11/29/19 0944  NA 142 140 140 144 140  K 3.9 3.9 4.2 3.9 3.3*  CL 101 103 102 104 101  CO2 30 28 27 28 30   GLUCOSE 231* 293* 313* 68* 170*  BUN 12 13 16 23 22   CREATININE 1.15* 1.23* 1.18* 1.16* 1.17*  CALCIUM 9.1 8.7* 9.7 9.7 9.0  MG 1.8  --   --   --   --     GFR: Estimated Creatinine Clearance: 55.2 mL/min (A) (by C-G formula based on SCr of 1.17 mg/dL (H)).   CBG: Recent Labs  Lab 11/28/19 1852 11/28/19 2113 11/29/19 0808 11/29/19 1017 11/29/19 1155  GLUCAP 117* 84 63* 162* 176*     Recent Results (from the past 240 hour(s))  Blood culture (routine x 2)     Status: None (Preliminary result)   Collection Time: 11/24/19  2:36 AM   Specimen: BLOOD  Result Value Ref Range Status   Specimen Description BLOOD RIGHT ARM  Final   Special Requests   Final    BOTTLES DRAWN AEROBIC AND ANAEROBIC Blood Culture results may not be optimal due to an inadequate volume of blood received in culture bottles   Culture   Final    NO GROWTH 4 DAYS Performed at [facility], [place] [address], [place], [place] [address]    Report Status PENDING  Incomplete  SARS Coronavirus 2 by RT PCR (hospital order, performed in [facility] hospital lab) Nasopharyngeal Nasopharyngeal Swab     Status: None   Collection Time: 11/24/19  2:39 AM   Specimen: Nasopharyngeal Swab  Result Value Ref Range Status   SARS Coronavirus 2 NEGATIVE NEGATIVE Final    Comment: (NOTE) SARS-CoV-2 target nucleic acids are NOT DETECTED. The SARS-CoV-2 RNA is generally detectable in upper and lower respiratory specimens during the acute phase of infection. The  lowest concentration of SARS-CoV-2 viral copies this assay can detect is 250 copies / mL. A negative result does not preclude SARS-CoV-2 infection and should not be used as the sole basis for treatment or other patient management decisions.  A negative result may occur with improper specimen collection / handling, submission of specimen other than nasopharyngeal swab, presence of viral mutation(s) within the areas targeted by this assay, and inadequate number of viral copies (<250 copies /  mL). A negative result must be combined with clinical observations, patient history, and epidemiological information. Fact Sheet for Patients:   StrictlyIdeas.no Fact Sheet for Healthcare Providers: BankingDealers.co.za This test is not yet approved or cleared  by the [place] FDA and has been authorized for detection and/or diagnosis of SARS-CoV-2 by FDA under an Emergency Use Authorization (EUA).  This EUA will remain in effect (meaning this test can be used) for the duration of the COVID-19 declaration under Section 564(b)(1) of the Act, 21 U.S.C. section 360bbb-3(b)(1), unless the authorization is terminated or revoked sooner. Performed at [facility], [place] [address] [place], [place] [address]   Blood culture (routine x 2)     Status: None (Preliminary result)   Collection Time: 11/24/19  2:39 AM   Specimen: BLOOD  Result Value Ref Range Status   Specimen Description BLOOD RIGHT HAND  Final   Special Requests   Final    BOTTLES DRAWN AEROBIC AND ANAEROBIC Blood Culture results may not be optimal due to an inadequate volume of blood received in culture bottles   Culture   Final    NO GROWTH 4 DAYS Performed at [facility], [place] [address], [place], [place] [address]    Report Status PENDING  Incomplete  Respiratory Panel by PCR     Status: None   Collection Time: 11/24/19  4:41 PM   Specimen: Nasopharyngeal Swab; Respiratory   Result Value Ref Range Status   Adenovirus NOT DETECTED NOT DETECTED Final   Coronavirus 229E NOT DETECTED NOT DETECTED Final    Comment: (NOTE) The Coronavirus on the Respiratory Panel, DOES NOT test for the novel  Coronavirus (2019 nCoV)    Coronavirus HKU1 NOT DETECTED NOT DETECTED Final   Coronavirus NL63 NOT DETECTED NOT DETECTED Final   Coronavirus OC43 NOT DETECTED NOT DETECTED Final   Metapneumovirus NOT DETECTED NOT DETECTED Final   Rhinovirus / Enterovirus NOT DETECTED NOT DETECTED Final   Influenza A NOT DETECTED NOT DETECTED Final   Influenza B NOT DETECTED NOT DETECTED Final   Parainfluenza Virus 1 NOT DETECTED NOT DETECTED Final   Parainfluenza Virus 2 NOT DETECTED NOT DETECTED Final   Parainfluenza Virus 3 NOT DETECTED NOT DETECTED Final   Parainfluenza Virus 4 NOT DETECTED NOT DETECTED Final   Respiratory Syncytial Virus NOT DETECTED NOT DETECTED Final   Bordetella pertussis NOT DETECTED NOT DETECTED Final   Chlamydophila pneumoniae NOT DETECTED NOT DETECTED Final   Mycoplasma pneumoniae NOT DETECTED NOT DETECTED Final    Comment: Performed at [facility], [place]. [address], [place], [place] [address]  Expectorated sputum assessment w rflx to resp cult     Status: None   Collection Time: 11/26/19 11:30 AM   Specimen: Expectorated Sputum  Result Value Ref Range Status   Specimen Description EXPECTORATED SPUTUM  Final   Special Requests NONE  Final   Sputum evaluation   Final    Sputum specimen not acceptable for testing.  Please recollect.   Gram Stain Report Called to,Read Back By and Verified With: RN D ODELL AT 1929 11/26/19 BY L BENFIELD Performed at [facility], [place] [address] [place], [place] [address]    Report Status 11/26/2019 FINAL  Final         Radiology Studies: No results found.      Scheduled Meds: . aspirin  81 mg Oral Daily  . atorvastatin  40 mg Oral Daily  . azithromycin  500 mg Oral  Daily  . enoxaparin (LOVENOX)  injection  40 mg Subcutaneous Q24H  . furosemide  40 mg Intravenous Q12H  . insulin aspart  0-20 Units Subcutaneous TID WC  . insulin aspart protamine- aspart  45 Units Subcutaneous Q supper  . [START ON 11/30/2019] insulin aspart protamine- aspart  75 Units Subcutaneous Q breakfast  . mometasone-formoterol  2 puff Inhalation BID  . montelukast  10 mg Oral Daily  . multivitamin with minerals  1 tablet Oral Daily  . olopatadine  1 drop Both Eyes BID  . pantoprazole  40 mg Oral Daily  . sodium chloride flush  3 mL Intravenous Once  . topiramate  75 mg Oral BID   Continuous Infusions: . cefTRIAXone (ROCEPHIN)  IV Stopped (11/29/19 0300)     LOS: 5 days       Bonnielee Haff, MD [facility]   To contact the attending provider between 7A-7P or the covering provider during after hours 7P-7A, please log into the web site [email or web address] and access using universal [place] password for that web site. If you do not have the password, please call the hospital operator.  11/29/2019, 12:29 PM

## 2019-11-29 NOTE — Progress Notes (Signed)
Pt was ambulated without oxygen for 64ft and her stats dropped to 80%. While ambulating she maintained at 80%. It took 3L of oxygen for her to recover to 91%. Pt states she felt dizziness and lightness with the exertion of ambulating.

## 2019-11-30 ENCOUNTER — Ambulatory Visit: Payer: Medicare HMO | Admitting: Internal Medicine

## 2019-11-30 LAB — BASIC METABOLIC PANEL
Anion gap: 13 (ref 5–15)
BUN: 21 mg/dL (ref 8–23)
CO2: 25 mmol/L (ref 22–32)
Calcium: 9.1 mg/dL (ref 8.9–10.3)
Chloride: 102 mmol/L (ref 98–111)
Creatinine, Ser: 1.07 mg/dL — ABNORMAL HIGH (ref 0.44–1.00)
GFR calc Af Amer: >60 mL/min (ref >60)
GFR calc non Af Amer: 54 mL/min — ABNORMAL LOW (ref >60)
Glucose, Bld: 72 mg/dL (ref 70–99)
Potassium: 3.8 mmol/L (ref 3.5–5.1)
Sodium: 140 mmol/L (ref 135–145)

## 2019-11-30 LAB — GLUCOSE, CAPILLARY
Glucose-Capillary: 80 mg/dL (ref 70–99)
Glucose-Capillary: 153 mg/dL — ABNORMAL HIGH (ref 70–99)
Glucose-Capillary: 67 mg/dL — ABNORMAL LOW (ref 70–99)
Glucose-Capillary: 209 mg/dL — ABNORMAL HIGH (ref 70–99)
Glucose-Capillary: 190 mg/dL — ABNORMAL HIGH (ref 70–99)

## 2019-11-30 LAB — TROPONIN I (HIGH SENSITIVITY)
Troponin I (High Sensitivity): 3 ng/L (ref <18)
Troponin I (High Sensitivity): 3 ng/L (ref <18)

## 2019-11-30 LAB — MAGNESIUM: Magnesium: 1.9 mg/dL (ref 1.7–2.4)

## 2019-11-30 MED ORDER — ALUM & MAG HYDROXIDE-SIMETH 200-200-20 MG/5ML PO SUSP
30.0000 mL | Freq: Once | ORAL | Status: AC
  Administered 2019-11-30: 30 mL via ORAL
  Filled 2019-11-30: qty 30

## 2019-11-30 NOTE — TOC Progression Note (Signed)
Transition of Care [facility]) - Progression Note    Patient Details  Name: Heidi [name] MRN: [id number] Date of Birth: March 02, 1951  Transition of Care [facility]) CM/SW [place], [place] Phone Number: 11/30/2019, 10:00 AM  Clinical Narrative:     CSW provided pt with medicaid application and instructions/contact info to apply for medicaid through [place].       Expected Discharge Plan and Services                                                 Social Determinants of Health (SDOH) Interventions    Readmission Risk Interventions No flowsheet data found.

## 2019-11-30 NOTE — Progress Notes (Signed)
PROGRESS NOTE    Heidi [name]  [id number] DOB: [birth year] DOA: 11/23/2019 PCP: Ladell Pier, MD    Brief Narrative:  69 year old lady prior history of asthma, diastolic heart failure, hypertension, stage IIIa CKD, insulin-dependent diabetes mellitus, morbid obesity presents with shortness of breath and chest pain.  Chest x-ray shows hazy bilateral opacities in the lung bases concerning for atypical infectious process such as viral pneumonia.  CT angiogram of the chest ruled out PE, SARS Covid PCR test is negative.  She was admitted for multifocal pneumonia and was started on IV Rocephin and Zithromax. She has completed 5 days of IV antibiotics. CXR on 5/24 shows interstitial edema, started on her IV lasix. She was weaned off oxygen.     Subjective: Patient mentions tightness/fullness in her chest area in the center.  She does have a history of acid reflux.  Denies radiation of the pain.  5 out of 10 in intensity.  No dizziness or lightheadedness.  Less short of breath.  Lower extremity swelling seems to be improving slightly.    Assessment & Plan:   Acute respiratory failure with hypoxia secondary to multifocal pneumonia Patient was started on IV Rocephin and Zithromax for the multifocal pneumonia.  She completed 5 days of antibiotics.   Still requiring 1 to 2 L of oxygen mainly with ambulation.  Does tend to desaturate when she is ambulatory.  She will need home oxygen with exertion.   She has an appt with PCCM on 5/27 for follow up.  Chest tightness EKG was done which does not show any acute ischemic changes.  Vital signs are stable.  Examination was unchanged.  Initial troponin level is normal.  Chest pain is atypical likely related to her GERD.  She was given Maalox.  Continue with PPI.  Wait for symptom to resolve. Echocardiogram was done in February 2021.  Normal systolic function was noted.  Chest x-ray done 3 days ago showed mild interstitial edema.  We will repeat tomorrow  morning.  Mild acute diastolic heart failure Patient had significant lower extremity edema as well as interstitial edema on chest x-ray.  She was given IV Lasix.  She has noted some improvement in her symptoms.  Lower extremity edema is slightly better compared to yesterday.  Continue IV Lasix for 1 more day.  She does not take her Lasix at home.  She will need a prescription for same.     Thyroid nodule TSH wnl.  Per ultrasound report apparently this lesion has been previously biopsied was noted to be benign.  Breast nodule on the CT angiogram recommend outpatient follow-up with mammogram.  Last mammogram done in September 2019 did not show any abnormalities. Discussed with the patient.   Uncontrolled diabetes mellitus with hyperglycemia probably secondary to steroids.  A1c is 10.5 %.  And with brittle control of diabetes.  She was noted to be hypoglycemic yesterday.  Dose of her 70/30 insulin was decreased.  Continue to monitor.    Hyperlipidemia  Resume lipitor.   Mild AKI Likely due to mild dehydration.  Now resolved.  Morbid obesity Estimated body mass index is 42.14 kg/m as calculated from the following:   Height as of this encounter: 5\' 3"  (1.6 m).   Weight as of this encounter: 107.9 kg.     DVT prophylaxis: Lovenox Code Status: Full code Family Communication: None at bedside Disposition:   Status is: Inpatient  Remains inpatient appropriate because:IV treatments appropriate due to intensity of illness or inability to take  PO need for IV diuretics.   Dispo: The patient is from: Home              Anticipated d/c is to: Home              Anticipated d/c date is: May 28              Patient currently is not medically stable to d/c.        Consultants:   None  Procedures: None Antimicrobials: On Rocephin and Zithromax since admission. Day 5 of antibiotics.     Objective: Vitals:   11/29/19 2204 11/30/19 0712 11/30/19 0734 11/30/19 0744  BP: 105/75   122/64   Pulse: 70  89   Resp: 18  16   Temp: 97.8 F (36.6 C)  97.8 F (36.6 C)   TempSrc: Oral  Oral   SpO2: 93%  94% 96%  Weight:  107.9 kg    Height:        Intake/Output Summary (Last 24 hours) at 11/30/2019 1131 Last data filed at 11/29/2019 2200 Gross per 24 hour  Intake 120 ml  Output --  Net 120 ml   Filed Weights   11/23/19 1704 11/30/19 0712  Weight: 108.9 kg 107.9 kg    Examination:  General appearance: Awake alert.  In no distress.  Morbidly obese Resp: Normal effort at rest.  Few crackles bilateral bases.  No wheezing or rhonchi. Cardio: S1-S2 is normal regular.  No S3-S4.  No rubs murmurs or bruit GI: Abdomen is soft.  Nontender nondistended.  Bowel sounds are present normal.  No masses organomegaly Extremities: No edema.  Full range of motion of lower extremities. Neurologic: Alert and oriented x3.  No focal neurological deficits.       Data Reviewed: I have personally reviewed following labs and imaging studies  CBC: Recent Labs  Lab 11/23/19 1706 11/26/19 1028 11/27/19 0255 11/28/19 0316  WBC 7.4 8.5 8.4 11.2*  NEUTROABS  --  5.5 7.0 7.1  HGB 13.4 12.1 12.7 13.1  HCT 45.0 40.5 42.2 43.0  MCV 91.1 91.6 90.8 89.6  PLT 222 192 209 [id number]    Basic Metabolic Panel: Recent Labs  Lab 11/25/19 1103 11/25/19 1103 11/26/19 1028 11/27/19 0255 11/28/19 0316 11/29/19 0944 11/30/19 0423  NA 142   < > 140 140 144 140 140  K 3.9   < > 3.9 4.2 3.9 3.3* 3.8  CL 101   < > 103 102 104 101 102  CO2 30   < > 28 27 28 30 25   GLUCOSE 231*   < > 293* 313* 68* 170* 72  BUN 12   < > 13 16 23 22 21   CREATININE 1.15*   < > 1.23* 1.18* 1.16* 1.17* 1.07*  CALCIUM 9.1   < > 8.7* 9.7 9.7 9.0 9.1  MG 1.8  --   --   --   --   --  1.9   < > = values in this interval not displayed.    GFR: Estimated Creatinine Clearance: 60.1 mL/min (A) (by C-G formula based on SCr of 1.07 mg/dL (H)).   CBG: Recent Labs  Lab 11/29/19 1017 11/29/19 1155 11/29/19 1614  11/29/19 2207 11/30/19 0735  GLUCAP 162* 176* 224* 132* 80     Recent Results (from the past 240 hour(s))  Blood culture (routine x 2)     Status: None   Collection Time: 11/24/19  2:36 AM   Specimen: BLOOD  Result  Value Ref Range Status   Specimen Description BLOOD RIGHT ARM  Final   Special Requests   Final    BOTTLES DRAWN AEROBIC AND ANAEROBIC Blood Culture results may not be optimal due to an inadequate volume of blood received in culture bottles   Culture   Final    NO GROWTH 5 DAYS Performed at [facility], [place] [address] [place], [place] [address]    Report Status 11/29/2019 FINAL  Final  SARS Coronavirus 2 by RT PCR (hospital order, performed in [facility] hospital lab) Nasopharyngeal Nasopharyngeal Swab     Status: None   Collection Time: 11/24/19  2:39 AM   Specimen: Nasopharyngeal Swab  Result Value Ref Range Status   SARS Coronavirus 2 NEGATIVE NEGATIVE Final    Comment: (NOTE) SARS-CoV-2 target nucleic acids are NOT DETECTED. The SARS-CoV-2 RNA is generally detectable in upper and lower respiratory specimens during the acute phase of infection. The lowest concentration of SARS-CoV-2 viral copies this assay can detect is 250 copies / mL. A negative result does not preclude SARS-CoV-2 infection and should not be used as the sole basis for treatment or other patient management decisions.  A negative result may occur with improper specimen collection / handling, submission of specimen other than nasopharyngeal swab, presence of viral mutation(s) within the areas targeted by this assay, and inadequate number of viral copies (<250 copies / mL). A negative result must be combined with clinical observations, patient history, and epidemiological information. Fact Sheet for Patients:   StrictlyIdeas.no Fact Sheet for Healthcare Providers: BankingDealers.co.za This test is not yet approved or cleared  by the [place] FDA and has been authorized for detection and/or diagnosis of SARS-CoV-2 by FDA under an Emergency Use Authorization (EUA).  This EUA will remain in effect (meaning this test can be used) for the duration of the COVID-19 declaration under Section 564(b)(1) of the Act, 21 U.S.C. section 360bbb-3(b)(1), unless the authorization is terminated or revoked sooner. Performed at [facility], [place] [address], [place], [place] [address]   Blood culture (routine x 2)     Status: None   Collection Time: 11/24/19  2:39 AM   Specimen: BLOOD  Result Value Ref Range Status   Specimen Description BLOOD RIGHT HAND  Final   Special Requests   Final    BOTTLES DRAWN AEROBIC AND ANAEROBIC Blood Culture results may not be optimal due to an inadequate volume of blood received in culture bottles   Culture   Final    NO GROWTH 5 DAYS Performed at [facility], [place] [address], [place], [place] [address]    Report Status 11/29/2019 FINAL  Final  Respiratory Panel by PCR     Status: None   Collection Time: 11/24/19  4:41 PM   Specimen: Nasopharyngeal Swab; Respiratory  Result Value Ref Range Status   Adenovirus NOT DETECTED NOT DETECTED Final   Coronavirus 229E NOT DETECTED NOT DETECTED Final    Comment: (NOTE) The Coronavirus on the Respiratory Panel, DOES NOT test for the novel  Coronavirus (2019 nCoV)    Coronavirus HKU1 NOT DETECTED NOT DETECTED Final   Coronavirus NL63 NOT DETECTED NOT DETECTED Final   Coronavirus OC43 NOT DETECTED NOT DETECTED Final   Metapneumovirus NOT DETECTED NOT DETECTED Final   Rhinovirus / Enterovirus NOT DETECTED NOT DETECTED Final   Influenza A NOT DETECTED NOT DETECTED Final   Influenza B NOT DETECTED NOT DETECTED Final   Parainfluenza Virus 1 NOT  DETECTED NOT DETECTED Final   Parainfluenza Virus 2 NOT DETECTED NOT DETECTED Final   Parainfluenza Virus 3 NOT DETECTED NOT DETECTED Final   Parainfluenza Virus 4 NOT DETECTED NOT DETECTED Final    Respiratory Syncytial Virus NOT DETECTED NOT DETECTED Final   Bordetella pertussis NOT DETECTED NOT DETECTED Final   Chlamydophila pneumoniae NOT DETECTED NOT DETECTED Final   Mycoplasma pneumoniae NOT DETECTED NOT DETECTED Final    Comment: Performed at [facility], [place] [address], [place], [place] [address]  Expectorated sputum assessment w rflx to resp cult     Status: None   Collection Time: 11/26/19 11:30 AM   Specimen: Expectorated Sputum  Result Value Ref Range Status   Specimen Description EXPECTORATED SPUTUM  Final   Special Requests NONE  Final   Sputum evaluation   Final    Sputum specimen not acceptable for testing.  Please recollect.   Gram Stain Report Called to,Read Back By and Verified With: RN D ODELL AT 1929 11/26/19 BY L BENFIELD Performed at [facility], [place] [address] [place], [place] [address]    Report Status 11/26/2019 FINAL  Final         Radiology Studies: No results found.      Scheduled Meds: . aspirin  81 mg Oral Daily  . atorvastatin  40 mg Oral Daily  . enoxaparin (LOVENOX) injection  40 mg Subcutaneous Q24H  . furosemide  40 mg Intravenous Q12H  . insulin aspart  0-20 Units Subcutaneous TID WC  . insulin aspart protamine- aspart  45 Units Subcutaneous Q supper  . insulin aspart protamine- aspart  75 Units Subcutaneous Q breakfast  . mometasone-formoterol  2 puff Inhalation BID  . montelukast  10 mg Oral Daily  . multivitamin with minerals  1 tablet Oral Daily  . olopatadine  1 drop Both Eyes BID  . pantoprazole  40 mg Oral Daily  . sodium chloride flush  3 mL Intravenous Once  . topiramate  75 mg Oral BID   Continuous Infusions:    LOS: 6 days       Bonnielee Haff, MD [facility]   To contact the attending provider between 7A-7P or the covering provider during after hours 7P-7A, please log into the web site [email or web address] and access using universal [place] password for that web site. If you do not have  the password, please call the hospital operator.  11/30/2019, 11:31 AM

## 2019-12-01 ENCOUNTER — Ambulatory Visit: Payer: Medicare HMO | Admitting: Pulmonary Disease

## 2019-12-01 ENCOUNTER — Inpatient Hospital Stay (HOSPITAL_COMMUNITY): Payer: Medicare HMO

## 2019-12-01 DIAGNOSIS — Z794 Long term (current) use of insulin: Secondary | ICD-10-CM

## 2019-12-01 DIAGNOSIS — E119 Type 2 diabetes mellitus without complications: Secondary | ICD-10-CM

## 2019-12-01 LAB — GLUCOSE, CAPILLARY
Glucose-Capillary: 170 mg/dL — ABNORMAL HIGH (ref 70–99)
Glucose-Capillary: 292 mg/dL — ABNORMAL HIGH (ref 70–99)
Glucose-Capillary: 47 mg/dL — ABNORMAL LOW (ref 70–99)
Glucose-Capillary: 89 mg/dL (ref 70–99)
Glucose-Capillary: 173 mg/dL — ABNORMAL HIGH (ref 70–99)

## 2019-12-01 LAB — BASIC METABOLIC PANEL
Anion gap: 6 (ref 5–15)
BUN: 21 mg/dL (ref 8–23)
CO2: 33 mmol/L — ABNORMAL HIGH (ref 22–32)
Calcium: 9.2 mg/dL (ref 8.9–10.3)
Chloride: 99 mmol/L (ref 98–111)
Creatinine, Ser: 1.1 mg/dL — ABNORMAL HIGH (ref 0.44–1.00)
GFR calc Af Amer: >60 mL/min (ref >60)
GFR calc non Af Amer: 52 mL/min — ABNORMAL LOW (ref >60)
Glucose, Bld: 246 mg/dL — ABNORMAL HIGH (ref 70–99)
Potassium: 3.5 mmol/L (ref 3.5–5.1)
Sodium: 138 mmol/L (ref 135–145)

## 2019-12-01 MED ORDER — FUROSEMIDE 10 MG/ML IJ SOLN
60.0000 mg | Freq: Three times a day (TID) | INTRAMUSCULAR | Status: DC
  Administered 2019-12-01 – 2019-12-02 (×5): 60 mg via INTRAVENOUS
  Administered 2019-12-03: 40 mg via INTRAVENOUS
  Filled 2019-12-01 (×7): qty 6

## 2019-12-01 NOTE — Progress Notes (Signed)
Inpatient Diabetes Program Recommendations  AACE/ADA: New Consensus Statement on Inpatient Glycemic Control (2015)  Target Ranges:  Prepandial:   less than 140 mg/dL      Peak postprandial:   less than 180 mg/dL (1-2 hours)      Critically ill patients:  140 - 180 mg/dL   Lab Results  Component Value Date   GLUCAP 170 (H) 12/01/2019   HGBA1C 10.5 (H) 11/13/2019    Review of Glycemic Control Results for Heidi [name], Heidi [name] (MRN [id number]) as of 12/01/2019 10:06  Ref. Range 11/30/2019 16:50 11/30/2019 18:17 11/30/2019 20:34 12/01/2019 07:56  Glucose-Capillary Latest Ref Range: 70 - 99 mg/dL 67 (L) 209 (H) 190 (H) 170 (H)   Diabetes history: DM 2 Outpatient Diabetes medications:  Novolog 70/30 80 units in the AM and 50 units q PM Current orders for Inpatient glycemic control:  Novolog 70/30 80 units in the AM and 50 units q PM Novolog resistant tid with meals and HS  Inpatient Diabetes Program Recommendations:    Noted hypoglycemia of 67 mg/dL. Of note, must receive correction within one hour of last CBG. Late correction places patient at risk for further hypoglycemic events.  Thanks, Bronson Curb, MSN, RNC-OB Diabetes Coordinator [phone] (8a-5p)

## 2019-12-01 NOTE — Plan of Care (Signed)

## 2019-12-01 NOTE — Progress Notes (Signed)
PROGRESS NOTE    Heidi [name]  [id number] DOB: 1951/04/15 DOA: 11/23/2019 PCP: Ladell Pier, MD    Brief Narrative:  69 year old lady prior history of asthma, diastolic heart failure, hypertension, stage IIIa CKD, insulin-dependent diabetes mellitus, morbid obesity presents with shortness of breath and chest pain.  Chest x-ray shows hazy bilateral opacities in the lung bases concerning for atypical infectious process such as viral pneumonia.  CT angiogram of the chest ruled out PE, SARS Covid PCR test is negative.  She was admitted for multifocal pneumonia and was started on IV Rocephin and Zithromax. She has completed 5 days of IV antibiotics. CXR on 5/24 shows interstitial edema, started on her IV lasix. She was weaned off oxygen.     Subjective: Patient continues to have some shortness of breath.  Still not feeling like her usual.  However at the same time she is not a very good historian.  No further chest discomfort.  She does have symptoms suggestive of orthopnea.    Assessment & Plan:   Acute respiratory failure with hypoxia secondary to multifocal pneumonia/pulmonary edema Patient was started on IV Rocephin and Zithromax for the multifocal pneumonia.  She completed 5 days of antibiotics.   Still requiring 1 to 2 L of oxygen mainly with ambulation.  Does tend to desaturate when she is ambulatory.  She will need home oxygen with exertion.   She has an appt with PCCM on 5/27 for follow up.  Chest tightness EKG was done which does not show any acute ischemic changes.  Vital signs are stable.  Examination was unchanged.  She ruled out for ACS with serial troponins.  She was given Maalox.  Symptoms have improved.  Continue with PPI.  Symptoms could also be due to volume overload.  Echocardiogram was done in February 2021.  Normal systolic function was noted.    Acute diastolic heart failure Patient had significant lower extremity edema as well as interstitial edema on chest  x-ray.  She was given IV Lasix.  Chest x-ray was repeated this morning which continues to show interstitial edema.  She still has lower extremity edema though it is slightly better.  We will increase the dose of IV Lasix.  Continue to diurese her.  Check daily weights.  Ins and outs.   She does not take her Lasix at home.  She will need a prescription for same.     Thyroid nodule TSH wnl.  Per ultrasound report apparently this lesion has been previously biopsied was noted to be benign.  Breast nodule on the CT angiogram recommend outpatient follow-up with mammogram.  Last mammogram done in September 2019 did not show any abnormalities. Discussed with the patient.   Uncontrolled diabetes mellitus with hyperglycemia probably secondary to steroids.  A1c is 10.5 %.  Very brittle diabetes.  She has had episodes of hyper and hypoglycemia.  Dose of her 70/30 insulin was decreased.  Continue to monitor.    Hyperlipidemia  Resume lipitor.   Mild AKI Likely due to mild dehydration.  Now resolved.  Morbid obesity Estimated body mass index is 42.14 kg/m as calculated from the following:   Height as of this encounter: 5\' 3"  (1.6 m).   Weight as of this encounter: 107.9 kg.     DVT prophylaxis: Lovenox Code Status: Full code Family Communication: None at bedside Disposition:   Status is: Inpatient  Remains inpatient appropriate because:IV treatments appropriate due to intensity of illness or inability to take PO continues to have interstitial  edema.  Lasix dose had to be increased.   Dispo: The patient is from: Home              Anticipated d/c is to: Home              Anticipated d/c date is: May 31              Patient currently is not medically stable to d/c.        Consultants:   None  Procedures: None  Antimicrobials:  Anti-infectives (From admission, onward)   Start     Dose/Rate Route Frequency Ordered Stop   11/26/19 1000  azithromycin (ZITHROMAX) tablet 500 mg   Status:  Discontinued     500 mg Oral Daily 11/25/19 1429 11/29/19 1237   11/25/19 0200  azithromycin (ZITHROMAX) 500 mg in sodium chloride 0.9 % 250 mL IVPB  Status:  Discontinued     500 mg 250 mL/hr over 60 Minutes Intravenous Every 24 hours 11/24/19 0435 11/25/19 1429   11/25/19 0200  cefTRIAXone (ROCEPHIN) 1 g in sodium chloride 0.9 % 100 mL IVPB  Status:  Discontinued     1 g 200 mL/hr over 30 Minutes Intravenous Every 24 hours 11/24/19 0435 11/29/19 1237   11/24/19 0215  cefTRIAXone (ROCEPHIN) 1 g in sodium chloride 0.9 % 100 mL IVPB     1 g 200 mL/hr over 30 Minutes Intravenous  Once 11/24/19 0204 11/24/19 0407   11/24/19 0215  azithromycin (ZITHROMAX) 500 mg in sodium chloride 0.9 % 250 mL IVPB     500 mg 250 mL/hr over 60 Minutes Intravenous  Once 11/24/19 0204 11/24/19 0407        Objective: Vitals:   11/30/19 2148 12/01/19 0736 12/01/19 0758 12/01/19 0801  BP: (!) 138/59   (!) 116/52  Pulse: 97  88 89  Resp: 20  16   Temp: 98.3 F (36.8 C)  98 F (36.7 C)   TempSrc: Oral     SpO2: 94% 95% 93% 94%  Weight:      Height:       No intake or output data in the 24 hours ending 12/01/19 1113 Filed Weights   11/23/19 1704 11/30/19 0712  Weight: 108.9 kg 107.9 kg    Examination:  General appearance: Awake alert.  In no distress.  Morbidly obese Resp: Crackles heard bilaterally.  No wheezing or rhonchi.  Normal effort at rest. Cardio: S1-S2 is normal regular.  No S3-S4.  No rubs murmurs or bruit GI: Abdomen is soft.  Nontender nondistended.  Bowel sounds are present normal.  No masses organomegaly Extremities: 1-2+ pitting edema bilateral lower extremities. Neurologic: Alert and oriented x3.  No focal neurological deficits.          Data Reviewed: I have personally reviewed following labs and imaging studies  CBC: Recent Labs  Lab 11/26/19 1028 11/27/19 0255 11/28/19 0316  WBC 8.5 8.4 11.2*  NEUTROABS 5.5 7.0 7.1  HGB 12.1 12.7 13.1  HCT 40.5 42.2  43.0  MCV 91.6 90.8 89.6  PLT 192 209 [id number]    Basic Metabolic Panel: Recent Labs  Lab 11/25/19 1103 11/26/19 1028 11/27/19 0255 11/28/19 0316 11/29/19 0944 11/30/19 0423 12/01/19 0227  NA 142   < > 140 144 140 140 138  K 3.9   < > 4.2 3.9 3.3* 3.8 3.5  CL 101   < > 102 104 101 102 99  CO2 30   < > 27 28 30 25  33*  GLUCOSE 231*   < > 313* 68* 170* 72 246*  BUN 12   < > 16 23 22 21 21   CREATININE 1.15*   < > 1.18* 1.16* 1.17* 1.07* 1.10*  CALCIUM 9.1   < > 9.7 9.7 9.0 9.1 9.2  MG 1.8  --   --   --   --  1.9  --    < > = values in this interval not displayed.    GFR: Estimated Creatinine Clearance: 58.4 mL/min (A) (by C-G formula based on SCr of 1.1 mg/dL (H)).   CBG: Recent Labs  Lab 11/30/19 1123 11/30/19 1650 11/30/19 1817 11/30/19 2034 12/01/19 0756  GLUCAP 153* 67* 209* 190* 170*     Recent Results (from the past 240 hour(s))  Blood culture (routine x 2)     Status: None   Collection Time: 11/24/19  2:36 AM   Specimen: BLOOD  Result Value Ref Range Status   Specimen Description BLOOD RIGHT ARM  Final   Special Requests   Final    BOTTLES DRAWN AEROBIC AND ANAEROBIC Blood Culture results may not be optimal due to an inadequate volume of blood received in culture bottles   Culture   Final    NO GROWTH 5 DAYS Performed at [facility], [place] [address] [place], [place] [address]    Report Status 11/29/2019 FINAL  Final  SARS Coronavirus 2 by RT PCR (hospital order, performed in [facility] hospital lab) Nasopharyngeal Nasopharyngeal Swab     Status: None   Collection Time: 11/24/19  2:39 AM   Specimen: Nasopharyngeal Swab  Result Value Ref Range Status   SARS Coronavirus 2 NEGATIVE NEGATIVE Final    Comment: (NOTE) SARS-CoV-2 target nucleic acids are NOT DETECTED. The SARS-CoV-2 RNA is generally detectable in upper and lower respiratory specimens during the acute phase of infection. The lowest concentration of SARS-CoV-2 viral copies this assay can  detect is 250 copies / mL. A negative result does not preclude SARS-CoV-2 infection and should not be used as the sole basis for treatment or other patient management decisions.  A negative result may occur with improper specimen collection / handling, submission of specimen other than nasopharyngeal swab, presence of viral mutation(s) within the areas targeted by this assay, and inadequate number of viral copies (<250 copies / mL). A negative result must be combined with clinical observations, patient history, and epidemiological information. Fact Sheet for Patients:   StrictlyIdeas.no Fact Sheet for Healthcare Providers: BankingDealers.co.za This test is not yet approved or cleared  by the [place] FDA and has been authorized for detection and/or diagnosis of SARS-CoV-2 by FDA under an Emergency Use Authorization (EUA).  This EUA will remain in effect (meaning this test can be used) for the duration of the COVID-19 declaration under Section 564(b)(1) of the Act, 21 U.S.C. section 360bbb-3(b)(1), unless the authorization is terminated or revoked sooner. Performed at [facility], [place] [address] [place],  [address]   Blood culture (routine x 2)     Status: None   Collection Time: 11/24/19  2:39 AM   Specimen: BLOOD  Result Value Ref Range Status   Specimen Description BLOOD RIGHT HAND  Final   Special Requests   Final    BOTTLES DRAWN AEROBIC AND ANAEROBIC Blood Culture results may not be optimal due to an inadequate volume of blood received in culture bottles   Culture   Final    NO GROWTH 5 DAYS Performed at [facility]  [facility], [place] [address], [place], [place] [address]    Report Status 11/29/2019 FINAL  Final  Respiratory Panel by PCR     Status: None   Collection Time: 11/24/19  4:41 PM   Specimen: Nasopharyngeal Swab; Respiratory  Result Value Ref Range Status   Adenovirus NOT DETECTED NOT DETECTED Final    Coronavirus 229E NOT DETECTED NOT DETECTED Final    Comment: (NOTE) The Coronavirus on the Respiratory Panel, DOES NOT test for the novel  Coronavirus (2019 nCoV)    Coronavirus HKU1 NOT DETECTED NOT DETECTED Final   Coronavirus NL63 NOT DETECTED NOT DETECTED Final   Coronavirus OC43 NOT DETECTED NOT DETECTED Final   Metapneumovirus NOT DETECTED NOT DETECTED Final   Rhinovirus / Enterovirus NOT DETECTED NOT DETECTED Final   Influenza A NOT DETECTED NOT DETECTED Final   Influenza B NOT DETECTED NOT DETECTED Final   Parainfluenza Virus 1 NOT DETECTED NOT DETECTED Final   Parainfluenza Virus 2 NOT DETECTED NOT DETECTED Final   Parainfluenza Virus 3 NOT DETECTED NOT DETECTED Final   Parainfluenza Virus 4 NOT DETECTED NOT DETECTED Final   Respiratory Syncytial Virus NOT DETECTED NOT DETECTED Final   Bordetella pertussis NOT DETECTED NOT DETECTED Final   Chlamydophila pneumoniae NOT DETECTED NOT DETECTED Final   Mycoplasma pneumoniae NOT DETECTED NOT DETECTED Final    Comment: Performed at [facility], [place]. [address] [place], [place] [address]  Expectorated sputum assessment w rflx to resp cult     Status: None   Collection Time: 11/26/19 11:30 AM   Specimen: Expectorated Sputum  Result Value Ref Range Status   Specimen Description EXPECTORATED SPUTUM  Final   Special Requests NONE  Final   Sputum evaluation   Final    Sputum specimen not acceptable for testing.  Please recollect.   Gram Stain Report Called to,Read Back By and Verified With: RN D ODELL AT 1929 11/26/19 BY L BENFIELD Performed at [facility], [place] [address] [place], [place] [address]    Report Status 11/26/2019 FINAL  Final         Radiology Studies: DG CHEST PORT 1 VIEW  Result Date: 12/01/2019 CLINICAL DATA:  Congestive heart failure. EXAM: PORTABLE CHEST 1 VIEW COMPARISON:  One-view chest x-ray 11/27/2019 FINDINGS: Heart is enlarged. Diffuse interstitial pattern has increased slightly. Bilateral  pleural effusions have increased as well. Bibasilar airspace disease likely reflects atelectasis. IMPRESSION: 1. Increasing interstitial pattern compatible with congestive heart failure. 2. Bibasilar airspace disease likely reflects atelectasis. Electronically Signed   By: San Morelle M.D.   On: 12/01/2019 08:21        Scheduled Meds: . aspirin  81 mg Oral Daily  . atorvastatin  40 mg Oral Daily  . enoxaparin (LOVENOX) injection  40 mg Subcutaneous Q24H  . furosemide  60 mg Intravenous Q8H  . insulin aspart  0-20 Units Subcutaneous TID WC  . insulin aspart protamine- aspart  45 Units Subcutaneous Q supper  . insulin aspart protamine- aspart  75 Units Subcutaneous Q breakfast  . mometasone-formoterol  2 puff Inhalation BID  . montelukast  10 mg Oral Daily  . multivitamin with minerals  1 tablet Oral Daily  . olopatadine  1 drop Both Eyes BID  . pantoprazole  40 mg Oral Daily  . sodium chloride flush  3 mL Intravenous Once  . topiramate  75 mg Oral BID   Continuous Infusions:    LOS: 7 days  Bonnielee Haff, MD [facility]   To contact the attending provider between 7A-7P or the covering provider during after hours 7P-7A, please log into the web site [email or web address] and access using universal [place] password for that web site. If you do not have the password, please call the hospital operator.  12/01/2019, 11:13 AM

## 2019-12-02 LAB — GLUCOSE, CAPILLARY
Glucose-Capillary: 196 mg/dL — ABNORMAL HIGH (ref 70–99)
Glucose-Capillary: 329 mg/dL — ABNORMAL HIGH (ref 70–99)
Glucose-Capillary: 153 mg/dL — ABNORMAL HIGH (ref 70–99)
Glucose-Capillary: 141 mg/dL — ABNORMAL HIGH (ref 70–99)

## 2019-12-02 LAB — BASIC METABOLIC PANEL
Anion gap: 10 (ref 5–15)
BUN: 17 mg/dL (ref 8–23)
CO2: 32 mmol/L (ref 22–32)
Calcium: 9.2 mg/dL (ref 8.9–10.3)
Chloride: 96 mmol/L — ABNORMAL LOW (ref 98–111)
Creatinine, Ser: 1.1 mg/dL — ABNORMAL HIGH (ref 0.44–1.00)
GFR calc Af Amer: >60 mL/min (ref >60)
GFR calc non Af Amer: 52 mL/min — ABNORMAL LOW (ref >60)
Glucose, Bld: 227 mg/dL — ABNORMAL HIGH (ref 70–99)
Potassium: 3.5 mmol/L (ref 3.5–5.1)
Sodium: 138 mmol/L (ref 135–145)

## 2019-12-02 MED ORDER — POTASSIUM CHLORIDE CRYS ER 20 MEQ PO TBCR
40.0000 meq | EXTENDED_RELEASE_TABLET | Freq: Two times a day (BID) | ORAL | Status: AC
  Administered 2019-12-02 (×2): 40 meq via ORAL
  Filled 2019-12-02 (×2): qty 2

## 2019-12-02 MED ORDER — INSULIN ASPART PROT & ASPART (70-30 MIX) 100 UNIT/ML ~~LOC~~ SUSP
65.0000 [IU] | Freq: Every day | SUBCUTANEOUS | Status: DC
  Administered 2019-12-02 – 2019-12-04 (×3): 65 [IU] via SUBCUTANEOUS
  Filled 2019-12-02 (×2): qty 10

## 2019-12-02 MED ORDER — INSULIN ASPART 100 UNIT/ML ~~LOC~~ SOLN
0.0000 [IU] | Freq: Three times a day (TID) | SUBCUTANEOUS | Status: DC
  Administered 2019-12-02: 11 [IU] via SUBCUTANEOUS
  Administered 2019-12-02: 3 [IU] via SUBCUTANEOUS
  Administered 2019-12-03: 8 [IU] via SUBCUTANEOUS
  Administered 2019-12-03: 3 [IU] via SUBCUTANEOUS
  Administered 2019-12-03 – 2019-12-04 (×2): 2 [IU] via SUBCUTANEOUS
  Administered 2019-12-04: 5 [IU] via SUBCUTANEOUS

## 2019-12-02 MED ORDER — METHOCARBAMOL 500 MG PO TABS
500.0000 mg | ORAL_TABLET | Freq: Once | ORAL | Status: AC
  Administered 2019-12-02: 500 mg via ORAL
  Filled 2019-12-02: qty 1

## 2019-12-02 NOTE — Progress Notes (Signed)
PROGRESS NOTE    Heidi [name]  [id number] DOB: 1951/03/21 DOA: 11/23/2019 PCP: Ladell Pier, MD    Brief Narrative:  69 year old lady prior history of asthma, diastolic heart failure, hypertension, stage IIIa CKD, insulin-dependent diabetes mellitus, morbid obesity presents with shortness of breath and chest pain.  Chest x-ray shows hazy bilateral opacities in the lung bases concerning for atypical infectious process such as viral pneumonia.  CT angiogram of the chest ruled out PE, SARS Covid PCR test is negative.  She was admitted for multifocal pneumonia and was started on IV Rocephin and Zithromax. She has completed 5 days of IV antibiotics. CXR on 5/24 shows interstitial edema, started on her IV lasix. She was weaned off oxygen.     Subjective: Patient states that she is feeling better.  Shortness of breath has improved.  Leg swelling is improving.  Denies any chest pain.  She appears to be in better spirits today.  Slept better overnight.  Assessment & Plan:   Acute diastolic heart failure Patient had significant lower extremity edema as well as interstitial edema on chest x-ray.  She was given IV Lasix.  Chest x-ray was repeated on 5/28 which continued to show interstitial edema.   Dose of Lasix was increased.  She seems to be diuresing better.  Although weight for unclear reason seems to be higher.  Continue strict ins and outs and daily weights.  Place potassium. She does not take her Lasix at home.  She will need a prescription for same.    Acute respiratory failure with hypoxia secondary to multifocal pneumonia Patient was started on IV Rocephin and Zithromax for the multifocal pneumonia.  She completed 5 days of antibiotics.   Still requiring 1 to 2 L of oxygen mainly with ambulation.  Does tend to desaturate when she is ambulatory.  She will need home oxygen with exertion.   She has an appt with PCCM on 5/27 for follow up.  Chest tightness on 5/27 EKG was done which  does not show any acute ischemic changes.  Vital signs are stable.  Examination was unchanged.  She ruled out for ACS with serial troponins.  She was given Maalox.  Symptoms have improved.  Continue with PPI.  Symptoms could also be due to volume overload.  Now feels better. Echocardiogram was done in February 2021.  Normal systolic function was noted.     Thyroid nodule TSH wnl.  Per ultrasound report apparently this lesion has been previously biopsied was noted to be benign.  Breast nodule on the CT angiogram recommend outpatient follow-up with mammogram.  Last mammogram done in September 2019 did not show any abnormalities. Discussed with the patient.   Uncontrolled diabetes mellitus with hyperglycemia probably secondary to steroids.  A1c is 10.5 %.  Very brittle diabetes.  She has had episodes of hyper and hypoglycemia.  Dose of her 70/30 insulin was decreased.  BG's appear to have stabilized some.  Though she did have another episode of hypoglycemia yesterday evening.  Hyperlipidemia  Resume lipitor.   Mild AKI Likely due to mild dehydration.  Now resolved.  Morbid obesity Estimated body mass index is 43.43 kg/m as calculated from the following:   Height as of this encounter: 5\' 3"  (1.6 m).   Weight as of this encounter: 111.2 kg.     DVT prophylaxis: Lovenox Code Status: Full code Family Communication: None at bedside Disposition:   Status is: Inpatient  Remains inpatient appropriate because:IV treatments appropriate due to intensity of illness  or inability to take PO continues to have interstitial edema.  Lasix dose had to be increased.   Dispo: The patient is from: Home              Anticipated d/c is to: Home              Anticipated d/c date is: May 31              Patient currently is not medically stable to d/c.        Consultants:   None  Procedures: None  Antimicrobials:  Anti-infectives (From admission, onward)   Start     Dose/Rate Route  Frequency Ordered Stop   11/26/19 1000  azithromycin (ZITHROMAX) tablet 500 mg  Status:  Discontinued     500 mg Oral Daily 11/25/19 1429 11/29/19 1237   11/25/19 0200  azithromycin (ZITHROMAX) 500 mg in sodium chloride 0.9 % 250 mL IVPB  Status:  Discontinued     500 mg 250 mL/hr over 60 Minutes Intravenous Every 24 hours 11/24/19 0435 11/25/19 1429   11/25/19 0200  cefTRIAXone (ROCEPHIN) 1 g in sodium chloride 0.9 % 100 mL IVPB  Status:  Discontinued     1 g 200 mL/hr over 30 Minutes Intravenous Every 24 hours 11/24/19 0435 11/29/19 1237   11/24/19 0215  cefTRIAXone (ROCEPHIN) 1 g in sodium chloride 0.9 % 100 mL IVPB     1 g 200 mL/hr over 30 Minutes Intravenous  Once 11/24/19 0204 11/24/19 0407   11/24/19 0215  azithromycin (ZITHROMAX) 500 mg in sodium chloride 0.9 % 250 mL IVPB     500 mg 250 mL/hr over 60 Minutes Intravenous  Once 11/24/19 0204 11/24/19 0407        Objective: Vitals:   12/01/19 2243 12/02/19 0300 12/02/19 0738 12/02/19 0754  BP: 126/64   119/61  Pulse: 100  98 98  Resp: 18  20 15   Temp: 97.7 F (36.5 C)   97.9 F (36.6 C)  TempSrc: Oral   Oral  SpO2: 95%  96% 95%  Weight:  111.2 kg    Height:        Intake/Output Summary (Last 24 hours) at 12/02/2019 1117 Last data filed at 12/02/2019 0355 Gross per 24 hour  Intake 540 ml  Output 1800 ml  Net -1260 ml   Filed Weights   11/30/19 0712 12/01/19 1246 12/02/19 0300  Weight: 107.9 kg 105.8 kg 111.2 kg    Examination:  General appearance: Awake alert.  In no distress.  Morbidly obese Resp: Improved aeration.  Normal effort at rest.  No use of accessory muscles.  Less crackles today compared to yesterday.  No wheezing or rhonchi.   Cardio: S1-S2 is normal regular.  No S3-S4.  No rubs murmurs or bruit GI: Abdomen is soft.  Nontender nondistended.  Bowel sounds are present normal.  No masses organomegaly Extremities: Improved edema bilateral lower extremities. Neurologic: Alert and oriented x3.  No  focal neurological deficits.        Data Reviewed: I have personally reviewed following labs and imaging studies  CBC: Recent Labs  Lab 11/26/19 1028 11/27/19 0255 11/28/19 0316  WBC 8.5 8.4 11.2*  NEUTROABS 5.5 7.0 7.1  HGB 12.1 12.7 13.1  HCT 40.5 42.2 43.0  MCV 91.6 90.8 89.6  PLT 192 209 [id number]    Basic Metabolic Panel: Recent Labs  Lab 11/28/19 0316 11/29/19 0944 11/30/19 0423 12/01/19 0227 12/02/19 0651  NA 144 140 140 138 138  K 3.9 3.3* 3.8 3.5 3.5  CL 104 101 102 99 96*  CO2 28 30 25  33* 32  GLUCOSE 68* 170* 72 246* 227*  BUN 23 22 21 21 17   CREATININE 1.16* 1.17* 1.07* 1.10* 1.10*  CALCIUM 9.7 9.0 9.1 9.2 9.2  MG  --   --  1.9  --   --     GFR: Estimated Creatinine Clearance: 59.5 mL/min (A) (by C-G formula based on SCr of 1.1 mg/dL (H)).   CBG: Recent Labs  Lab 12/01/19 1123 12/01/19 1620 12/01/19 1642 12/01/19 2110 12/02/19 0754  GLUCAP 292* 47* 89 173* 196*     Recent Results (from the past 240 hour(s))  Blood culture (routine x 2)     Status: None   Collection Time: 11/24/19  2:36 AM   Specimen: BLOOD  Result Value Ref Range Status   Specimen Description BLOOD RIGHT ARM  Final   Special Requests   Final    BOTTLES DRAWN AEROBIC AND ANAEROBIC Blood Culture results may not be optimal due to an inadequate volume of blood received in culture bottles   Culture   Final    NO GROWTH 5 DAYS Performed at [facility], [place] [address], [place], [place] [address]    Report Status 11/29/2019 FINAL  Final  SARS Coronavirus 2 by RT PCR (hospital order, performed in [facility] hospital lab) Nasopharyngeal Nasopharyngeal Swab     Status: None   Collection Time: 11/24/19  2:39 AM   Specimen: Nasopharyngeal Swab  Result Value Ref Range Status   SARS Coronavirus 2 NEGATIVE NEGATIVE Final    Comment: (NOTE) SARS-CoV-2 target nucleic acids are NOT DETECTED. The SARS-CoV-2 RNA is generally detectable in upper and lower respiratory specimens  during the acute phase of infection. The lowest concentration of SARS-CoV-2 viral copies this assay can detect is 250 copies / mL. A negative result does not preclude SARS-CoV-2 infection and should not be used as the sole basis for treatment or other patient management decisions.  A negative result may occur with improper specimen collection / handling, submission of specimen other than nasopharyngeal swab, presence of viral mutation(s) within the areas targeted by this assay, and inadequate number of viral copies (<250 copies / mL). A negative result must be combined with clinical observations, patient history, and epidemiological information. Fact Sheet for Patients:   StrictlyIdeas.no Fact Sheet for Healthcare Providers: BankingDealers.co.za This test is not yet approved or cleared  by the [place] FDA and has been authorized for detection and/or diagnosis of SARS-CoV-2 by FDA under an Emergency Use Authorization (EUA).  This EUA will remain in effect (meaning this test can be used) for the duration of the COVID-19 declaration under Section 564(b)(1) of the Act, 21 U.S.C. section 360bbb-3(b)(1), unless the authorization is terminated or revoked sooner. Performed at [facility], [place] [address], [place], [place] [address]   Blood culture (routine x 2)     Status: None   Collection Time: 11/24/19  2:39 AM   Specimen: BLOOD  Result Value Ref Range Status   Specimen Description BLOOD RIGHT HAND  Final   Special Requests   Final    BOTTLES DRAWN AEROBIC AND ANAEROBIC Blood Culture results may not be optimal due to an inadequate volume of blood received in culture bottles   Culture   Final    NO GROWTH 5 DAYS Performed at [facility], [place] [address], [place], [place] [address]    Report  Status 11/29/2019 FINAL  Final  Respiratory Panel by PCR     Status: None   Collection Time: 11/24/19  4:41 PM   Specimen:  Nasopharyngeal Swab; Respiratory  Result Value Ref Range Status   Adenovirus NOT DETECTED NOT DETECTED Final   Coronavirus 229E NOT DETECTED NOT DETECTED Final    Comment: (NOTE) The Coronavirus on the Respiratory Panel, DOES NOT test for the novel  Coronavirus (2019 nCoV)    Coronavirus HKU1 NOT DETECTED NOT DETECTED Final   Coronavirus NL63 NOT DETECTED NOT DETECTED Final   Coronavirus OC43 NOT DETECTED NOT DETECTED Final   Metapneumovirus NOT DETECTED NOT DETECTED Final   Rhinovirus / Enterovirus NOT DETECTED NOT DETECTED Final   Influenza A NOT DETECTED NOT DETECTED Final   Influenza B NOT DETECTED NOT DETECTED Final   Parainfluenza Virus 1 NOT DETECTED NOT DETECTED Final   Parainfluenza Virus 2 NOT DETECTED NOT DETECTED Final   Parainfluenza Virus 3 NOT DETECTED NOT DETECTED Final   Parainfluenza Virus 4 NOT DETECTED NOT DETECTED Final   Respiratory Syncytial Virus NOT DETECTED NOT DETECTED Final   Bordetella pertussis NOT DETECTED NOT DETECTED Final   Chlamydophila pneumoniae NOT DETECTED NOT DETECTED Final   Mycoplasma pneumoniae NOT DETECTED NOT DETECTED Final    Comment: Performed at [facility], [place] [address], [place], [place] [address]  Expectorated sputum assessment w rflx to resp cult     Status: None   Collection Time: 11/26/19 11:30 AM   Specimen: Expectorated Sputum  Result Value Ref Range Status   Specimen Description EXPECTORATED SPUTUM  Final   Special Requests NONE  Final   Sputum evaluation   Final    Sputum specimen not acceptable for testing.  Please recollect.   Gram Stain Report Called to,Read Back By and Verified With: RN D ODELL AT 1929 11/26/19 BY L BENFIELD Performed at [facility], [place] [address] [place], [place] [address]    Report Status 11/26/2019 FINAL  Final         Radiology Studies: DG CHEST PORT 1 VIEW  Result Date: 12/01/2019 CLINICAL DATA:  Congestive heart failure. EXAM: PORTABLE CHEST 1 VIEW COMPARISON:  One-view  chest x-ray 11/27/2019 FINDINGS: Heart is enlarged. Diffuse interstitial pattern has increased slightly. Bilateral pleural effusions have increased as well. Bibasilar airspace disease likely reflects atelectasis. IMPRESSION: 1. Increasing interstitial pattern compatible with congestive heart failure. 2. Bibasilar airspace disease likely reflects atelectasis. Electronically Signed   By: San Morelle M.D.   On: 12/01/2019 08:21        Scheduled Meds: . aspirin  81 mg Oral Daily  . atorvastatin  40 mg Oral Daily  . enoxaparin (LOVENOX) injection  40 mg Subcutaneous Q24H  . furosemide  60 mg Intravenous Q8H  . insulin aspart  0-15 Units Subcutaneous TID WC  . insulin aspart protamine- aspart  45 Units Subcutaneous Q supper  . insulin aspart protamine- aspart  65 Units Subcutaneous Q breakfast  . mometasone-formoterol  2 puff Inhalation BID  . montelukast  10 mg Oral Daily  . multivitamin with minerals  1 tablet Oral Daily  . olopatadine  1 drop Both Eyes BID  . pantoprazole  40 mg Oral Daily  . sodium chloride flush  3 mL Intravenous Once  . topiramate  75 mg Oral BID   Continuous Infusions:    LOS: 8 days       Bonnielee Haff, MD [facility]   To contact the attending provider between  7A-7P or the covering provider during after hours 7P-7A, please log into the web site [email or web address] and access using universal [place] password for that web site. If you do not have the password, please call the hospital operator.  12/02/2019, 11:17 AM

## 2019-12-03 LAB — BASIC METABOLIC PANEL
Anion gap: 11 (ref 5–15)
BUN: 16 mg/dL (ref 8–23)
CO2: 29 mmol/L (ref 22–32)
Calcium: 9.3 mg/dL (ref 8.9–10.3)
Chloride: 98 mmol/L (ref 98–111)
Creatinine, Ser: 1.09 mg/dL — ABNORMAL HIGH (ref 0.44–1.00)
GFR calc Af Amer: >60 mL/min (ref >60)
GFR calc non Af Amer: 52 mL/min — ABNORMAL LOW (ref >60)
Glucose, Bld: 133 mg/dL — ABNORMAL HIGH (ref 70–99)
Potassium: 3.6 mmol/L (ref 3.5–5.1)
Sodium: 138 mmol/L (ref 135–145)

## 2019-12-03 LAB — GLUCOSE, CAPILLARY
Glucose-Capillary: 196 mg/dL — ABNORMAL HIGH (ref 70–99)
Glucose-Capillary: 270 mg/dL — ABNORMAL HIGH (ref 70–99)
Glucose-Capillary: 122 mg/dL — ABNORMAL HIGH (ref 70–99)
Glucose-Capillary: 110 mg/dL — ABNORMAL HIGH (ref 70–99)

## 2019-12-03 LAB — MAGNESIUM: Magnesium: 1.9 mg/dL (ref 1.7–2.4)

## 2019-12-03 MED ORDER — FUROSEMIDE 40 MG PO TABS
60.0000 mg | ORAL_TABLET | Freq: Two times a day (BID) | ORAL | Status: DC
  Administered 2019-12-03 – 2019-12-04 (×2): 60 mg via ORAL
  Filled 2019-12-03 (×2): qty 1

## 2019-12-03 MED ORDER — POTASSIUM CHLORIDE CRYS ER 20 MEQ PO TBCR
40.0000 meq | EXTENDED_RELEASE_TABLET | Freq: Two times a day (BID) | ORAL | Status: AC
  Administered 2019-12-03 (×2): 40 meq via ORAL
  Filled 2019-12-03 (×2): qty 2

## 2019-12-03 NOTE — Evaluation (Signed)
SATURATION QUALIFICATIONS: (This note is used to comply with regulatory documentation for home oxygen)  Patient Saturations on Room Air at Rest = 89%  Patient Saturations on Room Air while Ambulating = 84%  Patient Saturations on 2 Liters of oxygen while Ambulating = 92%  Please briefly explain why patient needs home oxygen: Patient has been on home Oxygen provided by [employer].

## 2019-12-03 NOTE — Care Management (Signed)
Per patient she has O2 with [employer]

## 2019-12-03 NOTE — Progress Notes (Signed)
PROGRESS NOTE    Heidi [name]  [id number] DOB: 02-15-1951 DOA: 11/23/2019 PCP: Ladell Pier, MD    Brief Narrative:  69 year old lady prior history of asthma, diastolic heart failure, hypertension, stage IIIa CKD, insulin-dependent diabetes mellitus, morbid obesity presents with shortness of breath and chest pain.  Chest x-ray shows hazy bilateral opacities in the lung bases concerning for atypical infectious process such as viral pneumonia.  CT angiogram of the chest ruled out PE, SARS Covid PCR test is negative.  She was admitted for multifocal pneumonia and was started on IV Rocephin and Zithromax. She has completed 5 days of IV antibiotics. CXR on 5/24 shows interstitial edema, started on her IV lasix. She was weaned off oxygen.     Subjective: Patient is a very poor historian.  However it appears like she has improved.  She is feeling some better.  She is talking more than she was few days ago.  She has ambulated without much difficulty.    Assessment & Plan:   Acute diastolic heart failure Patient had significant lower extremity edema as well as interstitial edema on chest x-ray.  She was given IV Lasix.  Chest x-ray was repeated on 5/28 which continued to show interstitial edema.   Dose of Lasix was increased.  Patient has been refusing some of the doses of her Lasix.  She was told not to do so.  She tells me that she developed significant cramping in her hands and her feet.  Clinically she appears to have improved when though there is no appreciable change in her weight. She does not take her Lasix at home.  She will need a prescription for same.    Acute respiratory failure with hypoxia secondary to multifocal pneumonia Patient was started on IV Rocephin and Zithromax for the multifocal pneumonia.  She completed 5 days of antibiotics.   Still requiring 1 to 2 L of oxygen mainly with ambulation.  Does tend to desaturate when she is ambulatory.  She will need home oxygen  with exertion.  Check another ambulatory pulse ox. She has an appt with PCCM on 5/27 for follow up.  Chest tightness on 5/27 EKG was done which does not show any acute ischemic changes.  Vital signs are stable.  Examination was unchanged.  She ruled out for ACS with serial troponins.  She was given Maalox.  Symptoms have improved.  Continue with PPI.  Symptoms could also be due to volume overload.  Now feels better. Echocardiogram was done in February 2021.  Normal systolic function was noted.     Thyroid nodule TSH wnl.  Per ultrasound report apparently this lesion has been previously biopsied was noted to be benign.  Breast nodule on the CT angiogram Recommend outpatient follow-up with mammogram.  Last mammogram done in September 2019 did not show any abnormalities. Discussed with the patient.   Uncontrolled diabetes mellitus with hyperglycemia probably secondary to steroids.  A1c is 10.5 %.  Very brittle diabetes.  She has had episodes of hyper and hypoglycemia.  Dose of her 70/30 insulin was decreased.  CBGs fluctuating quite a bit.  Will hold off on changing dose of her insulin at this time.  Continue to monitor.    Hyperlipidemia  Resume lipitor.   Mild AKI Likely due to mild dehydration.  Now resolved.  Morbid obesity Estimated body mass index is 43.82 kg/m as calculated from the following:   Height as of this encounter: 5\' 3"  (1.6 m).   Weight as of  this encounter: 112.2 kg.     DVT prophylaxis: Lovenox Code Status: Full code Family Communication: None at bedside Disposition:   Status is: Inpatient  Remains inpatient appropriate because:IV treatments appropriate due to intensity of illness or inability to take PO continues to have interstitial edema.  Lasix dose had to be increased.   Dispo: The patient is from: Home              Anticipated d/c is to: Home              Anticipated d/c date is: May 31              Patient currently is not medically stable to  d/c.        Consultants:   None  Procedures: None  Antimicrobials:  Anti-infectives (From admission, onward)   Start     Dose/Rate Route Frequency Ordered Stop   11/26/19 1000  azithromycin (ZITHROMAX) tablet 500 mg  Status:  Discontinued     500 mg Oral Daily 11/25/19 1429 11/29/19 1237   11/25/19 0200  azithromycin (ZITHROMAX) 500 mg in sodium chloride 0.9 % 250 mL IVPB  Status:  Discontinued     500 mg 250 mL/hr over 60 Minutes Intravenous Every 24 hours 11/24/19 0435 11/25/19 1429   11/25/19 0200  cefTRIAXone (ROCEPHIN) 1 g in sodium chloride 0.9 % 100 mL IVPB  Status:  Discontinued     1 g 200 mL/hr over 30 Minutes Intravenous Every 24 hours 11/24/19 0435 11/29/19 1237   11/24/19 0215  cefTRIAXone (ROCEPHIN) 1 g in sodium chloride 0.9 % 100 mL IVPB     1 g 200 mL/hr over 30 Minutes Intravenous  Once 11/24/19 0204 11/24/19 0407   11/24/19 0215  azithromycin (ZITHROMAX) 500 mg in sodium chloride 0.9 % 250 mL IVPB     500 mg 250 mL/hr over 60 Minutes Intravenous  Once 11/24/19 0204 11/24/19 0407        Objective: Vitals:   12/02/19 2300 12/03/19 0300 12/03/19 0806 12/03/19 0819  BP: 111/60   121/64  Pulse: 91   (!) 110  Resp: 16  20 18   Temp: 98.7 F (37.1 C)   98.3 F (36.8 C)  TempSrc: Oral     SpO2: 91%  94% 91%  Weight:  112.2 kg    Height:        Intake/Output Summary (Last 24 hours) at 12/03/2019 1125 Last data filed at 12/03/2019 0600 Gross per 24 hour  Intake 920 ml  Output 500 ml  Net 420 ml   Filed Weights   12/01/19 1246 12/02/19 0300 12/03/19 0300  Weight: 105.8 kg 111.2 kg 112.2 kg    Examination:  General appearance: Awake alert.  In no distress.  Morbidly obese Resp: Improved aeration.  Few crackles only at the bases.  Improved from before.  No wheezing or rhonchi.  Normal effort.   Cardio: S1-S2 is normal regular.  No S3-S4.  No rubs murmurs or bruit GI: Abdomen is soft.  Nontender nondistended.  Bowel sounds are present normal.  No  masses organomegaly Extremities: Improving edema bilateral lower extremities. Neurologic: Alert and oriented x3.  No focal neurological deficits.        Data Reviewed: I have personally reviewed following labs and imaging studies  CBC: Recent Labs  Lab 11/27/19 0255 11/28/19 0316  WBC 8.4 11.2*  NEUTROABS 7.0 7.1  HGB 12.7 13.1  HCT 42.2 43.0  MCV 90.8 89.6  PLT 209 229  Basic Metabolic Panel: Recent Labs  Lab 11/29/19 0944 11/30/19 0423 12/01/19 0227 12/02/19 0651 12/03/19 0540  NA 140 140 138 138 138  K 3.3* 3.8 3.5 3.5 3.6  CL 101 102 99 96* 98  CO2 30 25 33* 32 29  GLUCOSE 170* 72 246* 227* 133*  BUN 22 21 21 17 16   CREATININE 1.17* 1.07* 1.10* 1.10* 1.09*  CALCIUM 9.0 9.1 9.2 9.2 9.3  MG  --  1.9  --   --  1.9    GFR: Estimated Creatinine Clearance: 60.3 mL/min (A) (by C-G formula based on SCr of 1.09 mg/dL (H)).   CBG: Recent Labs  Lab 12/02/19 0754 12/02/19 1217 12/02/19 1636 12/02/19 2109 12/03/19 0733  GLUCAP 196* 329* 153* 141* 196*     Recent Results (from the past 240 hour(s))  Blood culture (routine x 2)     Status: None   Collection Time: 11/24/19  2:36 AM   Specimen: BLOOD  Result Value Ref Range Status   Specimen Description BLOOD RIGHT ARM  Final   Special Requests   Final    BOTTLES DRAWN AEROBIC AND ANAEROBIC Blood Culture results may not be optimal due to an inadequate volume of blood received in culture bottles   Culture   Final    NO GROWTH 5 DAYS Performed at [facility], [place] [address], [place], [place] [address]    Report Status 11/29/2019 FINAL  Final  SARS Coronavirus 2 by RT PCR (hospital order, performed in [facility] hospital lab) Nasopharyngeal Nasopharyngeal Swab     Status: None   Collection Time: 11/24/19  2:39 AM   Specimen: Nasopharyngeal Swab  Result Value Ref Range Status   SARS Coronavirus 2 NEGATIVE NEGATIVE Final    Comment: (NOTE) SARS-CoV-2 target nucleic acids are NOT DETECTED. The  SARS-CoV-2 RNA is generally detectable in upper and lower respiratory specimens during the acute phase of infection. The lowest concentration of SARS-CoV-2 viral copies this assay can detect is 250 copies / mL. A negative result does not preclude SARS-CoV-2 infection and should not be used as the sole basis for treatment or other patient management decisions.  A negative result may occur with improper specimen collection / handling, submission of specimen other than nasopharyngeal swab, presence of viral mutation(s) within the areas targeted by this assay, and inadequate number of viral copies (<250 copies / mL). A negative result must be combined with clinical observations, patient history, and epidemiological information. Fact Sheet for Patients:   StrictlyIdeas.no Fact Sheet for Healthcare Providers: BankingDealers.co.za This test is not yet approved or cleared  by the [place] FDA and has been authorized for detection and/or diagnosis of SARS-CoV-2 by FDA under an Emergency Use Authorization (EUA).  This EUA will remain in effect (meaning this test can be used) for the duration of the COVID-19 declaration under Section 564(b)(1) of the Act, 21 U.S.C. section 360bbb-3(b)(1), unless the authorization is terminated or revoked sooner. Performed at [facility], [place] [address], [place], [place] [address]   Blood culture (routine x 2)     Status: None   Collection Time: 11/24/19  2:39 AM   Specimen: BLOOD  Result Value Ref Range Status   Specimen Description BLOOD RIGHT HAND  Final   Special Requests   Final    BOTTLES DRAWN AEROBIC AND ANAEROBIC Blood Culture results may not be optimal due to an inadequate volume of blood received in culture bottles   Culture   Final  NO GROWTH 5 DAYS Performed at [facility], [place] [address], [place], [place] [address]    Report Status 11/29/2019 FINAL  Final  Respiratory Panel by  PCR     Status: None   Collection Time: 11/24/19  4:41 PM   Specimen: Nasopharyngeal Swab; Respiratory  Result Value Ref Range Status   Adenovirus NOT DETECTED NOT DETECTED Final   Coronavirus 229E NOT DETECTED NOT DETECTED Final    Comment: (NOTE) The Coronavirus on the Respiratory Panel, DOES NOT test for the novel  Coronavirus (2019 nCoV)    Coronavirus HKU1 NOT DETECTED NOT DETECTED Final   Coronavirus NL63 NOT DETECTED NOT DETECTED Final   Coronavirus OC43 NOT DETECTED NOT DETECTED Final   Metapneumovirus NOT DETECTED NOT DETECTED Final   Rhinovirus / Enterovirus NOT DETECTED NOT DETECTED Final   Influenza A NOT DETECTED NOT DETECTED Final   Influenza B NOT DETECTED NOT DETECTED Final   Parainfluenza Virus 1 NOT DETECTED NOT DETECTED Final   Parainfluenza Virus 2 NOT DETECTED NOT DETECTED Final   Parainfluenza Virus 3 NOT DETECTED NOT DETECTED Final   Parainfluenza Virus 4 NOT DETECTED NOT DETECTED Final   Respiratory Syncytial Virus NOT DETECTED NOT DETECTED Final   Bordetella pertussis NOT DETECTED NOT DETECTED Final   Chlamydophila pneumoniae NOT DETECTED NOT DETECTED Final   Mycoplasma pneumoniae NOT DETECTED NOT DETECTED Final    Comment: Performed at [facility], [place]. [address] [place], [place] [address]  Expectorated sputum assessment w rflx to resp cult     Status: None   Collection Time: 11/26/19 11:30 AM   Specimen: Expectorated Sputum  Result Value Ref Range Status   Specimen Description EXPECTORATED SPUTUM  Final   Special Requests NONE  Final   Sputum evaluation   Final    Sputum specimen not acceptable for testing.  Please recollect.   Gram Stain Report Called to,Read Back By and Verified With: RN D ODELL AT 1929 11/26/19 BY L BENFIELD Performed at [facility], [place] [address], [place], [place] [address]    Report Status 11/26/2019 FINAL  Final         Radiology Studies: No results found.      Scheduled Meds: . aspirin  81 mg Oral  Daily  . atorvastatin  40 mg Oral Daily  . enoxaparin (LOVENOX) injection  40 mg Subcutaneous Q24H  . furosemide  60 mg Intravenous Q8H  . insulin aspart  0-15 Units Subcutaneous TID WC  . insulin aspart protamine- aspart  45 Units Subcutaneous Q supper  . insulin aspart protamine- aspart  65 Units Subcutaneous Q breakfast  . mometasone-formoterol  2 puff Inhalation BID  . montelukast  10 mg Oral Daily  . multivitamin with minerals  1 tablet Oral Daily  . olopatadine  1 drop Both Eyes BID  . pantoprazole  40 mg Oral Daily  . sodium chloride flush  3 mL Intravenous Once  . topiramate  75 mg Oral BID   Continuous Infusions:    LOS: 9 days       Bonnielee Haff, MD [facility]   To contact the attending provider between 7A-7P or the covering provider during after hours 7P-7A, please log into the web site [email or web address] and access using universal [place] password for that web site. If you do not have the password, please call the hospital operator.  12/03/2019, 11:25 AM

## 2019-12-04 DIAGNOSIS — I5033 Acute on chronic diastolic (congestive) heart failure: Secondary | ICD-10-CM

## 2019-12-04 LAB — BASIC METABOLIC PANEL
Anion gap: 9 (ref 5–15)
BUN: 17 mg/dL (ref 8–23)
CO2: 29 mmol/L (ref 22–32)
Calcium: 9.3 mg/dL (ref 8.9–10.3)
Chloride: 101 mmol/L (ref 98–111)
Creatinine, Ser: 0.92 mg/dL (ref 0.44–1.00)
GFR calc Af Amer: >60 mL/min (ref >60)
GFR calc non Af Amer: >60 mL/min (ref >60)
Glucose, Bld: 144 mg/dL — ABNORMAL HIGH (ref 70–99)
Potassium: 4.3 mmol/L (ref 3.5–5.1)
Sodium: 139 mmol/L (ref 135–145)

## 2019-12-04 LAB — GLUCOSE, CAPILLARY
Glucose-Capillary: 134 mg/dL — ABNORMAL HIGH (ref 70–99)
Glucose-Capillary: 207 mg/dL — ABNORMAL HIGH (ref 70–99)

## 2019-12-04 MED ORDER — INSULIN ASPART PROT & ASPART (70-30 MIX) 100 UNIT/ML ~~LOC~~ SUSP: 45.0000 [IU] | SUBCUTANEOUS | Status: DC

## 2019-12-04 MED ORDER — GUAIFENESIN-DM 100-10 MG/5ML PO SYRP: 5.0000 mL | ORAL_SOLUTION | ORAL | 0 refills | Status: DC | PRN

## 2019-12-04 MED ORDER — FUROSEMIDE 40 MG PO TABS: ORAL_TABLET | ORAL | 0 refills | Status: DC

## 2019-12-04 NOTE — Discharge Instructions (Signed)
Heart Failure, Self Care Heart failure is a serious condition. This sheet explains things you need to do to take care of yourself at home. To help you stay as healthy as possible, you may be asked to change your diet, take certain medicines, and make other changes in your life. Your doctor may also give you more specific instructions. If you have problems or questions, call your doctor. What are the risks? Having heart failure makes it more likely for you to have some problems. These problems can get worse if you do not take good care of yourself. Problems may include:  Blood clotting problems. This may cause a stroke.  Damage to the kidneys, liver, or lungs.  Abnormal heart rhythms. Supplies needed:  Scale for weighing yourself.  Blood pressure monitor.  Notebook.  Medicines. How to care for yourself when you have heart failure Medicines Take over-the-counter and prescription medicines only as told by your doctor. Take your medicines every day.  Do not stop taking your medicine unless your doctor tells you to do so.  Do not skip any medicines.  Get your prescriptions refilled before you run out of medicine. This is important. Eating and drinking   Eat heart-healthy foods. Talk with a diet specialist (dietitian) to create an eating plan.  Choose foods that: ? Have no trans fat. ? Are low in saturated fat and cholesterol.  Choose healthy foods, such as: ? Fresh or frozen fruits and vegetables. ? Fish. ? Low-fat (lean) meats. ? Legumes, such as beans, peas, and lentils. ? Fat-free or low-fat dairy products. ? Whole-grain foods. ? High-fiber foods.  Limit salt (sodium) if told by your doctor. Ask your diet specialist to tell you which seasonings are healthy for your heart.  Cook in healthy ways instead of frying. Healthy ways of cooking include roasting, grilling, broiling, baking, poaching, steaming, and stir-frying.  Limit how much fluid you drink, if told by your  doctor. Alcohol use  Do not drink alcohol if: ? Your doctor tells you not to drink. ? Your heart was damaged by alcohol, or you have very bad heart failure. ? You are pregnant, may be pregnant, or are planning to become pregnant.  If you drink alcohol: ? Limit how much you use to:  0-1 drink a day for women.  0-2 drinks a day for men. ? Be aware of how much alcohol is in your drink. In the [place] one drink equals one 12 oz bottle of beer (355 mL), one 5 oz glass of wine (148 mL), or one 1 oz glass of hard liquor (44 mL). Lifestyle   Do not use any products that contain nicotine or tobacco, such as cigarettes, e-cigarettes, and chewing tobacco. If you need help quitting, ask your doctor. ? Do not use nicotine gum or patches before talking to your doctor.  Do not use illegal drugs.  Lose weight if told by your doctor.  Do physical activity if told by your doctor. Talk to your doctor before you begin an exercise if: ? You are an older adult. ? You have very bad heart failure.  Learn to manage stress. If you need help, ask your doctor.  Get rehab (rehabilitation) to help you stay independent and to help with your quality of life.  Plan time to rest when you get tired. Check weight and blood pressure   Weigh yourself every day. This will help you to know if fluid is building up in your body. ? Weigh yourself every morning   after you pee (urinate) and before you eat breakfast. ? Wear the same amount of clothing each time. ? Write down your daily weight. Give your record to your doctor.  Check and write down your blood pressure as told by your doctor.  Check your pulse as told by your doctor. Dealing with very hot and very cold weather  If it is very hot: ? Avoid activities that take a lot of energy. ? Use air conditioning or fans, or find a cooler place. ? Avoid caffeine and alcohol. ? Wear clothing that is loose-fitting, lightweight, and light-colored.  If it is very  cold: ? Avoid activities that take a lot of energy. ? Layer your clothes. ? Wear mittens or gloves, a hat, and a scarf when you go outside. ? Avoid alcohol. Follow these instructions at home:  Stay up to date with shots (vaccines). Get pneumococcal and flu (influenza) shots.  Keep all follow-up visits as told by your doctor. This is important. Contact a doctor if:  You gain weight quickly.  You have increasing shortness of breath.  You cannot do your normal activities.  You get tired easily.  You cough a lot.  You don't feel like eating or feel like you may vomit (nauseous).  You become puffy (swell) in your hands, feet, ankles, or belly (abdomen).  You cannot sleep well because it is hard to breathe.  You feel like your heart is beating fast (palpitations).  You get dizzy when you stand up. Get help right away if:  You have trouble breathing.  You or someone else notices a change in your behavior, such as having trouble staying awake.  You have chest pain or discomfort.  You pass out (faint). These symptoms may be an emergency. Do not wait to see if the symptoms will go away. Get medical help right away. Call your local emergency services (911 in the [place] Do not drive yourself to the hospital. Summary  Heart failure is a serious condition. To care for yourself, you may have to change your diet, take medicines, and make other lifestyle changes.  Take your medicines every day. Do not stop taking them unless your doctor tells you to do so.  Eat heart-healthy foods, such as fresh or frozen fruits and vegetables, fish, lean meats, legumes, fat-free or low-fat dairy products, and whole-grain or high-fiber foods.  Ask your doctor if you can drink alcohol. You may have to stop alcohol use if you have very bad heart failure.  Contact your doctor if you gain weight quickly or feel that your heart is beating too fast. Get help right away if you pass out, or have chest pain  or trouble breathing. This information is not intended to replace advice given to you by your health care provider. Make sure you discuss any questions you have with your health care provider. Document Revised: 10/04/2018 Document Reviewed: 10/05/2018 Elsevier Patient Education  2020 [employer] Inc.  

## 2019-12-04 NOTE — Progress Notes (Signed)
Pt refusing to wear oxygen while being wheeled down to front to discharge.  Pulse ox obtained by this RN- 92% room air.  Nurse tech wheeled patient out for dc.

## 2019-12-04 NOTE — Progress Notes (Signed)
New order to discharge patient home.  Cardiac monitoring and PIV discontinued.  AVS reviewed and given to patient.  All questions answered.  NT to transport patient to main entrance for transportation home.

## 2019-12-04 NOTE — Discharge Summary (Signed)
[facility]  Physician Discharge Summary   Patient ID: Heidi [name] MRN: [id number] DOB/AGE: 1950-09-13 69 y.o.  Admit date: 11/23/2019 Discharge date: 12/04/2019  PCP: Ladell Pier, MD  DISCHARGE DIAGNOSES:  Acute diastolic CHF Acute on chronic respiratory failure with hypoxia Multifocal pneumonia Thyroid nodule Breast nodule Diabetes mellitus type 2, uncontrolled with hyperglycemia Hyperlipidemia Morbid obesity   RECOMMENDATIONS FOR OUTPATIENT FOLLOW UP: 1. Patient needs outpatient mammogram for the breast nodule 2. Message sent to her cardiologist, Dr. Virgina Jock, to arrange for follow-up for her chest pain 3. Patient has oxygen at home    Home Health: None Equipment/Devices: Has oxygen at home  CODE STATUS: Full code  DISCHARGE CONDITION: fair  Diet recommendation: As before  INITIAL HISTORY: 68 year old lady prior history of asthma, diastolic heart failure, hypertension, stage IIIa CKD, insulin-dependent diabetes mellitus, morbid obesity presents with shortness of breath and chest pain. Chest x-ray shows hazy bilateral opacities in the lung bases concerning for atypical infectious process such as viral pneumonia. CT angiogram of the chest ruled out PE, SARS Covid PCR test is negative. She was admitted for multifocal pneumonia and was started on IV Rocephin and Zithromax. She has completed 5 days of IV antibiotics. CXR on 5/24 shows interstitial edema, started on her IV lasix. She was weaned off oxygen.   HOSPITAL COURSE:   Acute diastolic heart failure Patient had significant lower extremity edema as well as interstitial edema on chest x-ray.  She was given IV Lasix.  Chest x-ray was repeated on 5/28 which continued to show interstitial edema.   Dose of Lasix was increased.  Patient has been refusing some of the doses of her Lasix.  She was told not to do so.  She tells me that she developed significant cramping in her hands and her feet.    Clinically she appears to have improved when though there is no appreciable change in her weight. She does not take her Lasix at home.  She will need a prescription for same.    Will switch over to Lasix from hydrochlorothiazide. Patient feels better this morning.  Looking forward to going home.  Acute respiratory failure with hypoxia secondary to multifocal pneumonia Patient was started on IV Rocephin and Zithromax for the multifocal pneumonia.  She completed 5 days of antibiotics.  Stable.  Has oxygen at home.  Chest tightness on 5/27 EKG was done which does not show any acute ischemic changes.  Vital signs are stable.  Examination was unchanged.  She ruled out for ACS with serial troponins.  She was given Maalox.  Symptoms have improved.  Continue with PPI.  Symptoms could also be due to volume overload.  Now feels better. Echocardiogram was done in February 2021.  Normal systolic function was noted.   Apparently there was plans to do outpatient stress test.  She has been seen by Dr. Virgina Jock with [facility] cardiology.   Thyroid nodule TSH wnl.  Per ultrasound report apparently this lesion has been previously biopsied was noted to be benign.  Breast nodule on the CT angiogram Recommend outpatient follow-up with mammogram.  Last mammogram done in September 2019 did not show any abnormalities. This was discussed with the patient.   Uncontrolled diabetes mellitus with hyperglycemia probably secondary to steroids.  A1c is 10.5 %.  Very brittle diabetes.  She has had episodes of hyper and hypoglycemia.    Resume her home insulin with dose adjusted.  Hyperlipidemia  Resume lipitor.   Mild AKI Likely due to mild dehydration.  Now resolved.  Morbid obesity Estimated body mass index is 43.82 kg/m as calculated from the following:   Height as of this encounter: _0  (1.6 m).   Weight as of this encounter: 112.2 kg.  Overall stable.  Okay for discharge home today.   PERTINENT  LABS:  The results of significant diagnostics from this hospitalization (including imaging, microbiology, ancillary and laboratory) are listed below for reference.    Microbiology: Recent Results (from the past 240 hour(s))  Respiratory Panel by PCR     Status: None   Collection Time: 11/24/19  4:41 PM   Specimen: Nasopharyngeal Swab; Respiratory  Result Value Ref Range Status   Adenovirus NOT DETECTED NOT DETECTED Final   Coronavirus 229E NOT DETECTED NOT DETECTED Final    Comment: (NOTE) The Coronavirus on the Respiratory Panel, DOES NOT test for the novel  Coronavirus (2019 nCoV)    Coronavirus HKU1 NOT DETECTED NOT DETECTED Final   Coronavirus NL63 NOT DETECTED NOT DETECTED Final   Coronavirus OC43 NOT DETECTED NOT DETECTED Final   Metapneumovirus NOT DETECTED NOT DETECTED Final   Rhinovirus / Enterovirus NOT DETECTED NOT DETECTED Final   Influenza A NOT DETECTED NOT DETECTED Final   Influenza B NOT DETECTED NOT DETECTED Final   Parainfluenza Virus 1 NOT DETECTED NOT DETECTED Final   Parainfluenza Virus 2 NOT DETECTED NOT DETECTED Final   Parainfluenza Virus 3 NOT DETECTED NOT DETECTED Final   Parainfluenza Virus 4 NOT DETECTED NOT DETECTED Final   Respiratory Syncytial Virus NOT DETECTED NOT DETECTED Final   Bordetella pertussis NOT DETECTED NOT DETECTED Final   Chlamydophila pneumoniae NOT DETECTED NOT DETECTED Final   Mycoplasma pneumoniae NOT DETECTED NOT DETECTED Final    Comment: Performed at [facility], [address] [place], [place] [address]  Expectorated sputum assessment w rflx to resp cult     Status: None   Collection Time: 11/26/19 11:30 AM   Specimen: Expectorated Sputum  Result Value Ref Range Status   Specimen Description EXPECTORATED SPUTUM  Final   Special Requests NONE  Final   Sputum evaluation   Final    Sputum specimen not acceptable for testing.  Please recollect.   Gram Stain Report Called to,Read Back By and Verified With: RN D ODELL AT  1929 11/26/19 BY L BENFIELD Performed at [facility], [place] [address] [place], [place] [address]    Report Status 11/26/2019 FINAL  Final     Labs:     Basic Metabolic Panel: Recent Labs  Lab 11/30/19 0423 12/01/19 0227 12/02/19 0651 12/03/19 0540 12/04/19 0325  NA 140 138 138 138 139  K 3.8 3.5 3.5 3.6 4.3  CL 102 99 96* 98 101  CO2 25 33* 32 29 29  GLUCOSE 72 246* 227* 133* 144*  BUN _1 CREATININE 1.07* 1.10* 1.10* 1.09* 0.92  CALCIUM 9.1 9.2 9.2 9.3 9.3  MG 1.9  --   --  1.9  --    CBC: Recent Labs  Lab 11/28/19 0316  WBC 11.2*  NEUTROABS 7.1  HGB 13.1  HCT 43.0  MCV 89.6  PLT 229   BNP: BNP (last 3 results) Recent Labs    08/24/19 1928 11/24/19 1033  BNP 22.1 19.7    CBG: Recent Labs  Lab 12/03/19 1207 12/03/19 1551 12/03/19 2113 12/04/19 0730 12/04/19 1106  GLUCAP 270* 122* 110* 134* 207*     IMAGING STUDIES DG Chest 2 View  Result Date:  11/27/2019 CLINICAL DATA:  Shortness of breath EXAM: CHEST - 2 VIEW COMPARISON:  11/23/2019 FINDINGS: Heart is borderline in size. Mild interstitial prominence and bibasilar opacities. No effusions. No acute bony abnormality. IMPRESSION: Mild interstitial prominence could reflect interstitial edema. Bibasilar atelectasis. Electronically Signed   By: Rolm Baptise M.D.   On: 11/27/2019 07:38   DG Chest 2 View  Result Date: 11/23/2019 CLINICAL DATA:  Chest pain. EXAM: CHEST - 2 VIEW COMPARISON:  None. FINDINGS: Mild diffuse chronic appearing increased interstitial lung markings are seen. There is no evidence of focal consolidation, pleural effusion or pneumothorax. The cardiac silhouette is mildly enlarged and unchanged in size. The visualized skeletal structures are unremarkable. IMPRESSION: No active cardiopulmonary disease. Electronically Signed   By: Virgina Norfolk M.D.   On: 11/23/2019 17:24   CT Angio Chest PE W and/or Wo Contrast  Result Date: 11/24/2019 CLINICAL DATA:  Chest pain.  Shortness of breath. EXAM: CT ANGIOGRAPHY CHEST WITH CONTRAST TECHNIQUE: Multidetector CT imaging of the chest was performed using the standard protocol during bolus administration of intravenous contrast. Multiplanar CT image reconstructions and MIPs were obtained to evaluate the vascular anatomy. CONTRAST:  29m OMNIPAQUE IOHEXOL 350 MG/ML SOLN COMPARISON:  None. FINDINGS: Cardiovascular: Contrast injection is sufficient to demonstrate satisfactory opacification of the pulmonary arteries to the segmental level. There is no pulmonary embolus. The main pulmonary artery is within normal limits for size. There is no CT evidence of acute right heart strain. The visualized aorta is normal. Heart size is enlarged. Mediastinum/Nodes: --No mediastinal or hilar lymphadenopathy. --No axillary lymphadenopathy. --No supraclavicular lymphadenopathy. --there is a 1.5 cm left-sided partially calcified thyroid nodule (axial series 5, image 21). --The esophagus is unremarkable Lungs/Pleura: There are hazy bilateral ground-glass airspace opacities primarily at the lung bases. There is some linear atelectasis at the lung bases, left worse than right. The lung volumes are low. There is no pneumothorax or significant pleural effusion. Upper Abdomen: There is a large partially visualized left renal cyst which appears similar to prior study in 2016 given differences in technique and slice selection. There is scattered bilateral breast nodules measuring up to approximately 8 mm on the left. Musculoskeletal: No chest wall abnormality. No acute or significant osseous findings. Review of the MIP images confirms the above findings. IMPRESSION: 1. No evidence for pulmonary embolus. 2. Cardiomegaly with hazy bilateral ground-glass airspace opacities primarily at the lung bases, suggestive of an atypical infectious process such as viral pneumonia. 3. Scattered bilateral breast nodules measuring up to approximately 8 mm on the left. Correlation  with outpatient mammography is recommended. 4. Partially calcified 1.5 cm left-sided thyroid nodule. Follow-up with a nonemergent outpatient thyroid ultrasound is recommended for further evaluation if this has not already been performed.(Ref: J Am Coll Radiol. 2015 Feb;12(2): 143-50). Electronically Signed   By: CConstance HolsterM.D.   On: 11/24/2019 01:58   DG CHEST PORT 1 VIEW  Result Date: 12/01/2019 CLINICAL DATA:  Congestive heart failure. EXAM: PORTABLE CHEST 1 VIEW COMPARISON:  One-view chest x-ray 11/27/2019 FINDINGS: Heart is enlarged. Diffuse interstitial pattern has increased slightly. Bilateral pleural effusions have increased as well. Bibasilar airspace disease likely reflects atelectasis. IMPRESSION: 1. Increasing interstitial pattern compatible with congestive heart failure. 2. Bibasilar airspace disease likely reflects atelectasis. Electronically Signed   By: CSan MorelleM.D.   On: 12/01/2019 08:21   [place]YROID  Result Date: 11/25/2019 CLINICAL DATA:  69year old female with a history of prior thyroid nodule biopsy 2016 on the left EXAM: THYROID  ULTRASOUND TECHNIQUE: Ultrasound examination of the thyroid gland and adjacent soft tissues was performed. COMPARISON:  01/04/2015, 01/31/2015 FINDINGS: Parenchymal Echotexture: Mildly heterogenous Isthmus: 0.7 cm Right lobe: 4.8 cm x 1.7 cm x 1.3 cm Left lobe: 4.1 cm x 2.0 cm x 1.2 cm _________________________________________________________ Estimated total number of nodules >/= 1 cm: 1 Number of spongiform nodules >/=  2 cm not described below (TR1): 0 Number of mixed cystic and solid nodules >/= 1.5 cm not described below (TR2): 0 _________________________________________________________ Nodule at the left inferior thyroid measures 1.6 cm, unchanged from the comparison ultrasound survey. By report this nodule has been previously biopsied. No adenopathy IMPRESSION: Similar appearance of left inferior thyroid nodule. This nodule has been  previously biopsied. Assuming a benign result, no further specific follow-up would be indicated. Recommendations follow those established by the new ACR TI-RADS criteria (J Am Coll Radiol 5993;57:017-793). Electronically Signed   By: Corrie Mckusick D.O.   On: 11/25/2019 11:33    DISCHARGE EXAMINATION: Vitals:   12/04/19 0352 12/04/19 0733 12/04/19 0735 12/04/19 1300  BP:  135/72    Pulse:  (!) 105    Resp:  17    Temp:  97.8 F (36.6 C)    TempSrc:      SpO2:  94% 94% 92%  Weight: 111.3 kg     Height:       General appearance: Awake alert.  In no distress Resp: Clear to auscultation bilaterally.  Normal effort Cardio: S1-S2 is normal regular.  No S3-S4.  No rubs murmurs or bruit GI: Abdomen is soft.  Nontender nondistended.  Bowel sounds are present normal.  No masses organomegaly    DISPOSITION: Home  Discharge Instructions    (HEART FAILURE PATIENTS) Call MD:  Anytime you have any of the following symptoms: 1) 3 pound weight gain in 24 hours or 5 pounds in 1 week 2) shortness of breath, with or without a dry hacking cough 3) swelling in the hands, feet or stomach 4) if you have to sleep on extra pillows at night in order to breathe.   Complete by: As directed    Call MD for:  difficulty breathing, headache or visual disturbances   Complete by: As directed    Call MD for:  extreme fatigue   Complete by: As directed    Call MD for:  persistant dizziness or light-headedness   Complete by: As directed    Call MD for:  persistant nausea and vomiting   Complete by: As directed    Call MD for:  severe uncontrolled pain   Complete by: As directed    Call MD for:  temperature >100.4   Complete by: As directed    Diet Carb Modified   Complete by: As directed    Discharge instructions   Complete by: As directed    Please be sure to take your medications as prescribed.  Please be sure to follow-up with your primary care provider.  You were cared for by a hospitalist during your  hospital stay. If you have any questions about your discharge medications or the care you received while you were in the hospital after you are discharged, you can call the unit and asked to speak with the hospitalist on call if the hospitalist that took care of you is not available. Once you are discharged, your primary care physician will handle any further medical issues. Please note that NO REFILLS for any discharge medications will be authorized once you are discharged, as  it is imperative that you return to your primary care physician (or establish a relationship with a primary care physician if you do not have one) for your aftercare needs so that they can reassess your need for medications and monitor your lab values. If you do not have a primary care physician, you can call [phone] for a physician referral.   Increase activity slowly   Complete by: As directed          Allergies as of 12/04/2019      Reactions   Januvia [sitagliptin]    Chest pains      Medication List    STOP taking these medications   hydrochlorothiazide 25 MG tablet Commonly known as: HYDRODIURIL     TAKE these medications   albuterol (2.5 MG/3ML) 0.083% nebulizer solution Commonly known as: PROVENTIL USE 1 VIAL VIA NEBULIZER EVERY 6 HOURS AS NEEDED FOR WHEEZING OR SHORTNESS OF BREATH What changed:   how much to take  how to take this  when to take this  reasons to take this  additional instructions   albuterol 108 (90 Base) MCG/ACT inhaler Commonly known as: VENTOLIN HFA Inhale 1-2 puffs into the lungs every 6 (six) hours as needed for wheezing or shortness of breath. What changed: Another medication with the same name was changed. Make sure you understand how and when to take each.   aspirin 81 MG chewable tablet Chew 81 mg by mouth daily.   atenolol 25 MG tablet Commonly known as: TENORMIN Take 1 tablet (25 mg total) by mouth daily.   atorvastatin 40 MG tablet Commonly known as:  LIPITOR TAKE 1 TABLET BY MOUTH DAILY AT 6 PM What changed:   how much to take  how to take this  when to take this  additional instructions   budesonide-formoterol 80-4.5 MCG/ACT inhaler Commonly known as: SYMBICORT Inhale 2 puffs into the lungs 2 (two) times daily.   furosemide 40 MG tablet Commonly known as: LASIX Take 23m twice daily for 5 days and then take 410monce daily.   guaiFENesin-dextromethorphan 100-10 MG/5ML syrup Commonly known as: ROBITUSSIN DM Take 5 mLs by mouth every 4 (four) hours as needed for cough.   insulin aspart protamine- aspart (70-30) 100 UNIT/ML injection Commonly known as: NOVOLOG MIX 70/30 Inject 0.45-0.7 mLs (45-70 Units total) into the skin See admin instructions. Inject 70 units in the morning and 45 units in the evening. What changed:   how much to take  how to take this  when to take this  additional instructions   INSULIN SYRINGE 1CC/29G 29G X 1/2" 1 ML Misc USE THREE TIMES DAILY AS DIRECTED   losartan 50 MG tablet Commonly known as: COZAAR Take 1 tablet (50 mg total) by mouth daily.   montelukast 10 MG tablet Commonly known as: SINGULAIR Take 1 tablet (10 mg total) by mouth at bedtime. What changed: when to take this   MUCINEX ALLERGY PO Take 1 tablet by mouth daily as needed (for allergy).   multivitamin with minerals Tabs tablet Take 1 tablet by mouth daily.   nitroGLYCERIN 0.3 MG SL tablet Commonly known as: NITROSTAT Place 1 tablet (0.3 mg total) under the tongue every 5 (five) minutes as needed for chest pain. Max 3 tablets in 15 minutes   olopatadine 0.1 % ophthalmic solution Commonly known as: PATANOL Place 1 drop into both eyes 2 (two) times daily.   OneTouch Delica Lancets 3326Zisc . Use as directed to test blood sugar three times  daily DX E11.65   OneTouch Verio Flex System w/Device Kit Use as directed to test blood sugar three times daily DX E11.65   OneTouch Verio test strip Generic drug: glucose  blood Use as directed to test blood sugar three times daily DX E11.65   pantoprazole 40 MG tablet Commonly known as: PROTONIX Take 1 tablet (40 mg total) by mouth daily. Must have office visit for refills   Potassium Chloride ER 20 MEQ Tbcr Take 1 tablet by mouth daily.   tetrahydrozoline-zinc 0.05-0.25 % ophthalmic solution Commonly known as: VISINE-AC Place 1 drop into both eyes 3 (three) times daily as needed.   topiramate 25 MG capsule Commonly known as: TOPAMAX Take 3 capsules (75 mg total) by mouth 2 (two) times daily.   traMADol 50 MG tablet Commonly known as: ULTRAM Take 1 tablet (50 mg total) by mouth every 8 (eight) hours as needed.            Durable Medical Equipment  (From admission, onward)         Start     Ordered   11/30/19 1729  For home use only DME oxygen  Once    Question Answer Comment  Length of Need 6 Months   Mode or (Route) Nasal cannula   Liters per Minute 2   Frequency Continuous (stationary and portable oxygen unit needed)   Oxygen conserving device Yes   Oxygen delivery system Gas      11/30/19 1728           Follow-up Information    Ladell Pier, MD. Schedule an appointment as soon as possible for a visit in 1 week(s).   Specialty: Internal Medicine Contact information: [place] [address] [phone]           TOTAL DISCHARGE TIME: 18 minutes  [facility] Pager on [email or web address]  12/04/2019, 1:38 PM

## 2019-12-05 ENCOUNTER — Telehealth: Payer: Self-pay

## 2019-12-05 NOTE — Telephone Encounter (Signed)
Transition Care Management Follow-up Telephone Call Date of discharge and from where: 12/04/2019 [facility] Called pt unable to reach/ Left voice message to call back. Name and phone nr provided.

## 2019-12-06 ENCOUNTER — Telehealth: Payer: Self-pay

## 2019-12-06 ENCOUNTER — Other Ambulatory Visit: Payer: Self-pay

## 2019-12-06 ENCOUNTER — Ambulatory Visit: Payer: Medicare HMO

## 2019-12-06 DIAGNOSIS — I10 Essential (primary) hypertension: Secondary | ICD-10-CM | POA: Diagnosis not present

## 2019-12-06 DIAGNOSIS — R0789 Other chest pain: Secondary | ICD-10-CM

## 2019-12-06 DIAGNOSIS — E1165 Type 2 diabetes mellitus with hyperglycemia: Secondary | ICD-10-CM | POA: Diagnosis not present

## 2019-12-06 NOTE — Telephone Encounter (Signed)
Pt called very upset about not seeing PCP [facility] after hospital discharge. Pt states will come to the office Monday 12/11/19 and wait for someone to see her even if it is not her PCP due to [place] out of office.  Please call pt about health concerns.

## 2019-12-06 NOTE — Telephone Encounter (Signed)
Called pt unable to reach/ Left voice message to call back. Name and phone nr provided. 

## 2019-12-06 NOTE — Telephone Encounter (Signed)
2 nd attempt  Transition Care Management Follow-up Telephone Call Date of discharge and from where: 12/04/2019 Heidi [name] Called pt at [phone] [facility])   [phone] (Home Phone)unable to reach/ Left voice message to call back. Name and phone nr provided.

## 2019-12-06 NOTE — Patient Outreach (Signed)
[facility]) Care Management  12/06/2019  Heidi [name] 12-12-1950 [id number]   Referral Date: 12/06/19 Referral Source: [employer] Report  Date of Discharge: 12/04/19 Facility: [place]:  [facility]  Referral received.  No outreach warranted at this time.  Transition of Care calls being completed via EMMI. RN CM will outreach patient for any red flags received.    Plan: RN CM will close case.    Jone Baseman, RN, MSN [facility] Care Management Care Management Coordinator Direct Line [phone] Toll Free: [phone]  Fax: [phone]

## 2019-12-07 ENCOUNTER — Encounter: Payer: Medicare HMO | Admitting: Internal Medicine

## 2019-12-12 NOTE — Progress Notes (Signed)
Called and spoke with patient regarding her stress test results.

## 2019-12-13 NOTE — Progress Notes (Signed)
Patient ID: Heidi [name], female   DOB: 01/23/1951, 69 y.o.   MRN: [id number]   Virtual Visit via Telephone Note  I connected with Heidi [name] on 12/14/19 at 10:50 AM EDT by telephone and verified that I am speaking with the correct person using two identifiers.   I discussed the limitations, risks, security and privacy concerns of performing an evaluation and management service by telephone and the availability of in person appointments. I also discussed with the patient that there may be a patient responsible charge related to this service. The patient expressed understanding and agreed to proceed.  PATIENT visit by telephone virtually in the context of Covid-19 pandemic. Patient location:  home My Location:  [facility] office Persons on the call:  Me and the patient   History of Present Illness: After hospitalization 5/20-5/31/2021.  She is having a cough with some yellow phlegm but feels like overall that she is improving except they have been using some chemicals at her apartment complex.  Blood sugar was 122 this morning.  Her energy is improving.  Her appetite is good.  No fever.    From discharge summary: DISCHARGE DIAGNOSES:  Acute diastolic CHF Acute on chronic respiratory failure with hypoxia Multifocal pneumonia Thyroid nodule Breast nodule Diabetes mellitus type 2, uncontrolled with hyperglycemia Hyperlipidemia Morbid obesity   RECOMMENDATIONS FOR OUTPATIENT FOLLOW UP: 1. Patient needs outpatient mammogram for the breast nodule 2. Message sent to her cardiologist, Dr. Virgina Jock, to arrange for follow-up for her chest pain 3. Patient has oxygen at home  INITIAL HISTORY: 69 year old lady prior history of asthma, diastolic heart failure, hypertension, stage IIIa CKD, insulin-dependent diabetes mellitus, morbid obesity presents with shortness of breath and chest pain. Chest x-ray shows hazy bilateral opacities in the lung bases concerning for atypical infectious process  such as viral pneumonia. CT angiogram of the chest ruled out PE, SARS Covid PCR test is negative. She was admitted for multifocal pneumonia and was started on IV Rocephin and Zithromax. She has completed 5 days of IV antibiotics. CXR on 5/24 shows interstitial edema, started on her IV lasix. She was weaned off oxygen.   HOSPITAL COURSE:   Acute diastolic heart failure Patient had significant lower extremity edema as well as interstitial edema on chest x-ray. She was given IV Lasix. Chest x-ray was repeated on 5/28 which continued to show interstitial edema.  Dose of Lasix was increased.Patient has been refusing some of the doses of her Lasix. She was told not to do so. She tells me that she developed significant cramping in her hands and her feet.  Clinically she appears to have improved when though there is no appreciable change in her weight. She does not take her Lasix at home. She will need a prescription for same.   Will switch over to Lasix from hydrochlorothiazide. Patient feels better this morning.  Looking forward to going home.  Acute respiratory failure with hypoxia secondary to multifocal pneumonia Patient was started on IV Rocephin and Zithromax for the multifocal pneumonia. She completed 5 days of antibiotics. Stable.  Has oxygen at home.  Chest tightness on 5/27 EKG was done which does not show any acute ischemic changes. Vital signs are stable. Examination was unchanged. She ruled out for ACS with serial troponins. She was given Maalox. Symptoms have improved. Continue with PPI. Symptoms could also be due to volume overload. Now feels better. Echocardiogram was done in February 2021. Normal systolic function was noted.  Apparently there was plans to do outpatient stress  test.  She has been seen by Dr. Virgina Jock with [facility] cardiology.  Thyroid nodule TSH wnl. Per ultrasound report apparently this lesion has been previously biopsied was noted to  be benign.  Breast nodule on the CT angiogram Recommend outpatient follow-up with mammogram. Last mammogram done in September 2019 did not show any abnormalities. This was discussed with the patient.   Uncontrolled diabetes mellitus with hyperglycemia probably secondary to steroids.  A1c is 10.5 %. Very brittle diabetes. She has had episodes of hyper and hypoglycemia.   Resume her home insulin with dose adjusted.  Hyperlipidemia  Resume lipitor.   Mild AKI Likely due to mild dehydration. Now resolved.  Morbid obesity Estimated body mass index is 43.82 kg/m as calculated from the following: Height as of this encounter: 5\' 3"  (1.6 m). Weight as of this encounter: 112.2 kg.    Observations/Objective: NAD.  A&Ox3  Assessment and Plan: 1. Type 2 diabetes mellitus with stage 2 chronic kidney disease, unspecified whether long term insulin use ([place]) Uncontrolled but improving.  She has been working on diet.  Check blood sugars bid and record and bring to next visit.  Continue current regimen  2. Breast lump or mass on CT - [facility] Digital Diagnostic Bilat; Future  3. Multifocal pneumonia Will do another round of antibiotics since she is still coughing up discolored phlegm - azithromycin (ZITHROMAX) 250 MG tablet; Take 2 today then 1 daily  Dispense: 6 tablet; Refill: 0 - fluconazole (DIFLUCAN) 150 MG tablet; Take 1 tablet (150 mg total) by mouth once for 1 dose.  Dispense: 1 tablet; Refill: 0  4. Hospital discharge follow-up  5. Acute on chronic diastolic congestive heart failure [facility]) Make appt with cardiology per hospital d/c instructions   Follow Up Instructions: See Dr Wynetta Emery next month as scheduled   I discussed the assessment and treatment plan with the patient. The patient was provided an opportunity to ask questions and all were answered. The patient agreed with the plan and demonstrated an understanding of the instructions.   The patient was advised to call  back or seek an in-person evaluation if the symptoms worsen or if the condition fails to improve as anticipated.  I provided 22 minutes of non-face-to-face time during this encounter.   Freeman Caldron, PA-C

## 2019-12-14 ENCOUNTER — Ambulatory Visit: Payer: Medicare HMO | Attending: Internal Medicine | Admitting: Physician Assistant

## 2019-12-14 DIAGNOSIS — N63 Unspecified lump in unspecified breast: Secondary | ICD-10-CM

## 2019-12-14 DIAGNOSIS — J189 Pneumonia, unspecified organism: Secondary | ICD-10-CM | POA: Diagnosis not present

## 2019-12-14 DIAGNOSIS — Z09 Encounter for follow-up examination after completed treatment for conditions other than malignant neoplasm: Secondary | ICD-10-CM

## 2019-12-14 DIAGNOSIS — N182 Chronic kidney disease, stage 2 (mild): Secondary | ICD-10-CM

## 2019-12-14 DIAGNOSIS — I5033 Acute on chronic diastolic (congestive) heart failure: Secondary | ICD-10-CM | POA: Diagnosis not present

## 2019-12-14 DIAGNOSIS — E1122 Type 2 diabetes mellitus with diabetic chronic kidney disease: Secondary | ICD-10-CM | POA: Diagnosis not present

## 2019-12-14 MED ORDER — FLUCONAZOLE 150 MG PO TABS: 150.0000 mg | ORAL_TABLET | Freq: Once | ORAL | 0 refills | Status: AC

## 2019-12-14 MED ORDER — AZITHROMYCIN 250 MG PO TABS: ORAL_TABLET | ORAL | 0 refills | Status: DC

## 2019-12-14 NOTE — Progress Notes (Signed)
Name and DOB reviewed  C /O productive cough  BGS 122 fasting

## 2019-12-19 ENCOUNTER — Telehealth: Payer: Self-pay

## 2019-12-19 NOTE — Telephone Encounter (Signed)
No significant heart muscle circulation abnormalities noted on stress test.  I called patient. No answer. Left voicemail.  Thanks MJP

## 2019-12-19 NOTE — Telephone Encounter (Signed)
Patient called and stated that someone called her from our office, giving her some recent results, but she did not know for what, because whoever she spoke with did not go into detail. I tried to look up telephone call, but I did see anything in her chart. Patient does not want anyone else to call her now, she would like for you to call her. Thank you.

## 2019-12-20 ENCOUNTER — Telehealth: Payer: Self-pay

## 2019-12-20 NOTE — Telephone Encounter (Signed)
Called and spoke with patient regarding her stress test results.

## 2019-12-20 NOTE — Telephone Encounter (Signed)
Called patient again, NA, LMAM.

## 2019-12-22 ENCOUNTER — Other Ambulatory Visit: Payer: Self-pay | Admitting: Physician Assistant

## 2019-12-22 DIAGNOSIS — N63 Unspecified lump in unspecified breast: Secondary | ICD-10-CM

## 2019-12-24 DIAGNOSIS — R079 Chest pain, unspecified: Secondary | ICD-10-CM | POA: Diagnosis not present

## 2019-12-28 ENCOUNTER — Encounter: Payer: Self-pay | Admitting: Internal Medicine

## 2019-12-28 ENCOUNTER — Ambulatory Visit: Payer: Medicare HMO | Admitting: Internal Medicine

## 2019-12-28 VITALS — BP 130/74 | HR 85 | Ht 64.0 in | Wt 240.1 lb

## 2019-12-28 DIAGNOSIS — Z86010 Personal history of colonic polyps: Secondary | ICD-10-CM | POA: Diagnosis not present

## 2019-12-28 NOTE — Patient Instructions (Signed)
Please follow up with Dr. Henrene Pastor in 3 months.  Please call the office in August to schedule your appointment in September.

## 2019-12-28 NOTE — Telephone Encounter (Signed)
Error

## 2019-12-28 NOTE — Progress Notes (Signed)
HISTORY OF PRESENT ILLNESS:  Heidi [name] is a 69 y.o. female with multiple significant medical problems who presents today regarding surveillance colonoscopy.  Patient has a family history of colon cancer in her parent and sibling.  She also has history of multiple colon polyps.  Last colonoscopy April 2018.  Medical history is remarkable for morbid obesity, insulin requiring diabetes mellitus, recent hospitalization for congestive heart failure and bilateral pneumonia, and the current need for chronic oxygen therapy.  She is accompanied today by her family member.  The patient's only GI complaint is belching.  She is very concerned about developing colon cancer and is anxious to have a colonoscopy.  She has had no bleeding.  I reviewed her extensive hospitalization in detail.  Also, I have reviewed laboratories.  Hemoglobin Nov 28, 2019 was normal at 13.1.  Recent renal function normal.  REVIEW OF SYSTEMS:  All non-GI ROS negative unless otherwise stated in the HPI except for shortness of breath, anxiety, arthritis  Past Medical History:  Diagnosis Date  . Adenomatous colon polyp   . Anemia   . Anxiety   . Asthma   . CHF (congestive heart failure) ([place])   . Diabetes mellitus without complication ([place])   . High cholesterol   . Hypertension   . Left knee injury   . Pneumonia     Past Surgical History:  Procedure Laterality Date  . ABDOMINAL HYSTERECTOMY    . CATARACT EXTRACTION Left   . CATARACT EXTRACTION     LT 01/2017, 02/2017 on RT  . CESAREAN SECTION      Social History Heidi [name]  reports that she has never smoked. She has never used smokeless tobacco. She reports that she does not drink alcohol and does not use drugs.  family history includes Colon cancer in her father and sister; Diabetes type II in her brother and sister.  Allergies  Allergen Reactions  . Januvia [Sitagliptin]     Chest pains       PHYSICAL EXAMINATION: Vital signs: BP 130/74   Pulse 85   Ht  5\' 4"  (1.626 m)   Wt 240 lb 2 oz (108.9 kg)   BMI 41.22 kg/m   Constitutional: Obese and unhealthy appearing wearing nasal cannula oxygen, no acute distress Psychiatric: alert and oriented x3, cooperative Eyes: extraocular movements intact, anicteric, conjunctiva pink Mouth: oral pharynx moist, no lesions Neck: supple no lymphadenopathy Cardiovascular: heart regular rate and rhythm, no murmur Lungs: clear to auscultation bilaterally Abdomen: soft, obese, nontender, nondistended, no obvious ascites, no peritoneal signs, normal bowel sounds, no organomegaly Rectal: Omitted Extremities: no clubbing or cyanosis.  Trace lower extremity edema bilaterally Skin: no lesions on visible extremities Neuro: No focal deficits.  Cranial nerves intact  ASSESSMENT:  1.  Personal history of adenomatous colon polyps.  No active GI complaints 2.  Family history of colon cancer 3.  Last colonoscopy April 2018 4.  Multiple medical problems including obesity, history of heart failure, recent pneumonia on chronic oxygen, and insulin requiring diabetes.  Still recovering from her recent hospitalization   PLAN:  1.  Patient should convalesce further before scheduling routine surveillance colonoscopy.  She is high risk.  As such, would like to see her back in the office in 3 months for reassessment.  Thereafter if she is appropriate for surveillance colonoscopy and still requires oxygen, this will need to be done at the hospital.The nature of the procedure, as well as the risks, benefits, and alternatives were carefully and thoroughly reviewed  with the patient. Ample time for discussion and questions allowed. The patient understood, was satisfied, and agreed to proceed.

## 2019-12-31 ENCOUNTER — Other Ambulatory Visit: Payer: Self-pay | Admitting: Internal Medicine

## 2019-12-31 DIAGNOSIS — I10 Essential (primary) hypertension: Secondary | ICD-10-CM

## 2019-12-31 DIAGNOSIS — K219 Gastro-esophageal reflux disease without esophagitis: Secondary | ICD-10-CM

## 2020-01-03 ENCOUNTER — Other Ambulatory Visit: Payer: Self-pay

## 2020-01-03 ENCOUNTER — Ambulatory Visit: Payer: Medicare HMO | Admitting: Cardiology

## 2020-01-03 ENCOUNTER — Encounter: Payer: Self-pay | Admitting: Cardiology

## 2020-01-03 VITALS — BP 119/66 | HR 86 | Resp 17 | Ht 64.0 in | Wt 237.0 lb

## 2020-01-03 DIAGNOSIS — R0789 Other chest pain: Secondary | ICD-10-CM

## 2020-01-03 DIAGNOSIS — I10 Essential (primary) hypertension: Secondary | ICD-10-CM

## 2020-01-03 DIAGNOSIS — R0609 Other forms of dyspnea: Secondary | ICD-10-CM | POA: Insufficient documentation

## 2020-01-03 DIAGNOSIS — E1165 Type 2 diabetes mellitus with hyperglycemia: Secondary | ICD-10-CM

## 2020-01-03 NOTE — Progress Notes (Signed)
Patient referred by Ladell Pier, MD for chest pain  Subjective:   Heidi [name], female    DOB: March 01, 1951, 69 y.o.   MRN: [id number]   Chief Complaint  Patient presents with  . Chest Pain  . Follow-up  . Results    STRESS TEST    HPI  69 y.o. African American female with hypertension, type 2 DM with retinopathy, hyperlipidemia, CKD stage III, OSA, obesity, breast nodule, with shortness of breath.  Patient was recently hospitalized in May 2021 with complaints of shortness of breath.  Work-up with CT chest was concerning for bilateral hazy, groundglass opacities concerning for atypical infectious process versus viral pneumonia.  There was no PE.  She was treated for presumed acute diastolic heart failure, with IV Lasix.  There was concern regarding patient's refusal of certain meds during the hospitalization.  She was also treated with IV Rocephin and Zithromax for possible multifocal pneumonia with a 5-day course.  She was discharged home on supplemental oxygen, which is baseline for her.  Patient underwent work-up for ACS given her chest pain.  ACS was excluded.  Subsequent stress test did not show any ischemia.  Patient reportedly underwent cardiac catheterization at [facility] in [place] in 2014, which showed normal coronaries and elevated LVEDP at 20 mmHg. She had a CT scan of her chest in twenty sixteen that showed scattered coronary artery calcifications.  Echocardiogram in February 2021 showed normal EF, indeterminate diastolic function, mild right ventricular enlargement, no significant valvular abnormality.  There was inadequate tricuspid regurgitation signal to assess PA pressure.    Current Outpatient Medications on File Prior to Visit  Medication Sig Dispense Refill  . albuterol (PROVENTIL) (2.5 MG/3ML) 0.083% nebulizer solution USE 1 VIAL VIA NEBULIZER EVERY 6 HOURS AS NEEDED FOR WHEEZING OR SHORTNESS OF BREATH (Patient taking differently: Take 2.5 mg by  nebulization every 6 (six) hours as needed for wheezing or shortness of breath. ) 25 vial 0  . albuterol (VENTOLIN HFA) 108 (90 Base) MCG/ACT inhaler Inhale 1-2 puffs into the lungs every 6 (six) hours as needed for wheezing or shortness of breath. 8 g 0  . aspirin 81 MG chewable tablet Chew 81 mg by mouth daily.     Marland Kitchen atenolol (TENORMIN) 25 MG tablet Take 1 tablet (25 mg total) by mouth daily. 90 tablet 3  . atorvastatin (LIPITOR) 40 MG tablet TAKE 1 TABLET BY MOUTH DAILY AT 6 PM (Patient taking differently: Take 40 mg by mouth daily. ) 30 tablet 11  . budesonide-formoterol (SYMBICORT) 80-4.5 MCG/ACT inhaler Inhale 2 puffs into the lungs 2 (two) times daily. 1 Inhaler 3  . Fexofenadine HCl (MUCINEX ALLERGY PO) Take 1 tablet by mouth daily as needed (for allergy).    . furosemide (LASIX) 40 MG tablet Take 42m twice daily for 5 days and then take 466monce daily. 60 tablet 0  . glucose blood (ONETOUCH VERIO) test strip Use as directed to test blood sugar three times daily DX E11.65 100 each 6  . guaiFENesin-dextromethorphan (ROBITUSSIN DM) 100-10 MG/5ML syrup Take 5 mLs by mouth every 4 (four) hours as needed for cough. 118 mL 0  . insulin aspart protamine- aspart (NOVOLOG MIX 70/30) (70-30) 100 UNIT/ML injection Inject 0.45-0.7 mLs (45-70 Units total) into the skin See admin instructions. Inject 70 units in the morning and 45 units in the evening.    . INSULIN SYRINGE 1CC/29G 29G X 1/2" 1 ML MISC USE THREE TIMES DAILY AS DIRECTED 100 each 3  .  losartan (COZAAR) 50 MG tablet Take 1 tablet (50 mg total) by mouth daily. 90 tablet 3  . montelukast (SINGULAIR) 10 MG tablet Take 1 tablet (10 mg total) by mouth at bedtime. (Patient taking differently: Take 10 mg by mouth daily. ) 90 tablet 1  . Multiple Vitamin (MULTIVITAMIN WITH MINERALS) TABS tablet Take 1 tablet by mouth daily.    . nitroGLYCERIN (NITROSTAT) 0.3 MG SL tablet Place 1 tablet (0.3 mg total) under the tongue every 5 (five) minutes as needed  for chest pain. Max 3 tablets in 15 minutes 30 tablet 1  . olopatadine (PATANOL) 0.1 % ophthalmic solution Place 1 drop into both eyes 2 (two) times daily. 5 mL 0  . OneTouch Delica Lancets 88K MISC . Use as directed to test blood sugar three times daily DX E11.65 100 each 6  . pantoprazole (PROTONIX) 40 MG tablet TAKE 1 TABLET(40 MG) BY MOUTH DAILY 30 tablet 0  . Potassium Chloride ER 20 MEQ TBCR TAKE 1 TABLET BY MOUTH DAILY 30 tablet 0  . tetrahydrozoline-zinc (VISINE-AC) 0.05-0.25 % ophthalmic solution Place 1 drop into both eyes 3 (three) times daily as needed.    . topiramate (TOPAMAX) 25 MG capsule Take 3 capsules (75 mg total) by mouth 2 (two) times daily. 180 capsule 3  . traMADol (ULTRAM) 50 MG tablet Take 1 tablet (50 mg total) by mouth every 8 (eight) hours as needed. 30 tablet 0   No current facility-administered medications on file prior to visit.    Cardiovascular and other pertinent studies:  Lexiscan Tetrofosmin stress test 12/09/2019: Lexiscan nuclear stress test performed using 1-day protocol. Stress EKG is non-diagnostic, as this is pharmacological stress test using Lexiscan. Myocardial perfusion imaging is normal. Stress LVEF 74% Low risk study.   CT Chest 11/24/2019: 1. No evidence for pulmonary embolus. 2. Cardiomegaly with hazy bilateral ground-glass airspace opacities primarily at the lung bases, suggestive of an atypical infectious process such as viral pneumonia. 3. Scattered bilateral breast nodules measuring up to approximately 8 mm on the left. Correlation with outpatient mammography is recommended. 4. Partially calcified 1.5 cm left-sided thyroid nodule. Follow-up with a nonemergent outpatient thyroid ultrasound is recommended for further evaluation if this has not already been performed.(Ref: J Am Coll Radiol. 2015 Feb;12(2): 143-50).  EKG 11/22/2019: Sinus rhythm 80 bpm.  Poor R-wave progression. Cannot exclude old inferior infarct.   Echocardiogram  08/25/2019: 1. Left ventricular ejection fraction, by estimation, is 65 to 70%. The left ventricle has normal function.  LV endocardial border not optimally defined to evaluate regional wall motion. There is mild concentric left ventricular hypertrophy.  Indeterminate diastolic filling due to E-A fusion.  2. Right ventricular systolic function is normal. The right ventricular size is mildly enlarged.  Tricuspid regurgitation signal is inadequate for assessing PA pressure.  3. The mitral valve is grossly normal. No evidence of mitral valve regurgitation. No evidence of mitral stenosis.  4. The aortic valve is grossly normal. Aortic valve regurgitation is not visualized. No aortic stenosis is present.   CT Chest 04/09/2015: 1. Mild airspace opacity within the left lower lung lobe may reflect atelectasis or possibly mild residual pneumonia.  Minimal atelectasis at the medial aspect of the right middle lobe. Lungs otherwiseclear. 2. Scattered coronary artery calcification seen. 3. Left renal cyst, with mild associated peripheral calcification.  Cardiac catheterization 2014 [facility], [place]): 1. Angiographically normal coronaries 2. LVEDP 20 mm Hg (mildly increased)   Recent labs: 08/25/2019-11/13/2019: Glucose 82, BUN/Cr 14/1.14. EGFR 57. Na/K  146/4.6. Alkaline Phospatase 122. H/H 14/46.3. MCV 88.5. Platelets 185. HbA1C 10.5% TSH 1.977 normal BNP 23.  HS Trop 3 X2  06/14/2019: Chol 118, TG 148, HDL 56, LDL 37     Review of Systems  Cardiovascular: Positive for chest pain and dyspnea on exertion. Negative for leg swelling, palpitations and syncope.        Vitals:   01/03/20 1410  BP: 119/66  Pulse: 86  Resp: 17  SpO2: 96%     Body mass index is 40.68 kg/m. Filed Weights   01/03/20 1410  Weight: 237 lb (107.5 kg)     Objective:   Physical Exam Vitals and nursing note reviewed.  Constitutional:      General: She is not in acute distress.    Comments: On  supplemental oxygen  Neck:     Vascular: No JVD.  Cardiovascular:     Rate and Rhythm: Normal rate and regular rhythm.     Pulses: Intact distal pulses.     Heart sounds: Normal heart sounds. No murmur heard.   Pulmonary:     Effort: Pulmonary effort is normal.     Breath sounds: Normal breath sounds. No wheezing or rales.         Assessment & Recommendations:   69 y.o. African American female with hypertension, type 2 DM with retinopathy, hyperlipidemia, CKD stage III, OSA, obesity, referred for chest pain and shortness of breath.   Shortness of breath: Likely multifactorial including obesity, deconditioning, unclear pulmonary pathology, possible diastolic heart failure.  It is worthwhile noting any however, that her BNP is always been normal, there is no evidence of pulmonary hypertension on echocardiogram, stress test shows no ischemia. Continue Lasix 40 mg daily for now.  Recommend follow-up with pulmonology.  No clear pulmonary pathology is identified, and if her shortness of breath and oxygen requirement continue to be an issue, could perform right heart catheterization to establish her cardiac filling pressures.  Management of other medical comorbidities as per PCP.\  I will see her back in 3 months.  Nigel Mormon, MD [facility] Pager: [phone] Office: [phone]

## 2020-01-04 ENCOUNTER — Other Ambulatory Visit: Payer: Medicare HMO

## 2020-01-05 ENCOUNTER — Ambulatory Visit: Payer: Medicare HMO | Admitting: Internal Medicine

## 2020-01-15 NOTE — Progress Notes (Signed)
Interval history 01/16/2020: Patient with chronic intractable migraines. Still doing exceptionally well on Botox for Migraines, only botox and topiramate have ever helped. Prior tried multiple classes of medications which did not help with headaches or migraines until she started Botox. Since starting botox she has had  >95% improvement in frequency and severity, had to redo one botox and she did well again. The migraines she does have are sparse, are when botox is wearing off and are easier to treat and >60% less severe.Nothing has worked in the past except botox and Topiramate.  No masseters or any other  Consent Form Botulism Toxin Injection For Chronic Migraine    Reviewed orally with patient, additionally signature is on file:  Botulism toxin has been approved by the Federal drug administration for treatment of chronic migraine. Botulism toxin does not cure chronic migraine and it may not be effective in some patients.  The administration of botulism toxin is accomplished by injecting a small amount of toxin into the muscles of the neck and head. Dosage must be titrated for each individual. Any benefits resulting from botulism toxin tend to wear off after 3 months with a repeat injection required if benefit is to be maintained. Injections are usually done every 3-4 months with maximum effect peak achieved by about 2 or 3 weeks. Botulism toxin is expensive and you should be sure of what costs you will incur resulting from the injection.  The side effects of botulism toxin use for chronic migraine may include:   -Transient, and usually mild, facial weakness with facial injections  -Transient, and usually mild, head or neck weakness with head/neck injections  -Reduction or loss of forehead facial animation due to forehead muscle weakness  -Eyelid drooping  -Dry eye  -Pain at the site of injection or bruising at the site of injection  -Double vision  -Potential unknown long term  risks  Contraindications: You should not have Botox if you are pregnant, nursing, allergic to albumin, have an infection, skin condition, or muscle weakness at the site of the injection, or have myasthenia gravis, Lambert-Eaton syndrome, or ALS.  It is also possible that as with any injection, there may be an allergic reaction or no effect from the medication. Reduced effectiveness after repeated injections is sometimes seen and rarely infection at the injection site may occur. All care will be taken to prevent these side effects. If therapy is given over a long time, atrophy and wasting in the muscle injected may occur. Occasionally the patient's become refractory to treatment because they develop antibodies to the toxin. In this event, therapy needs to be modified.  I have read the above information and consent to the administration of botulism toxin.    BOTOX PROCEDURE NOTE FOR MIGRAINE HEADACHE    Contraindications and precautions discussed with patient(above). Aseptic procedure was observed and patient tolerated procedure. Procedure performed by Dr. Georgia Dom  The condition has existed for more than 6 months, and pt does not have a diagnosis of ALS, Myasthenia Gravis or Lambert-Eaton Syndrome.  Risks and benefits of injections discussed and pt agrees to proceed with the procedure.  Written consent obtained  These injections are medically necessary. Pt  receives good benefits from these injections. These injections do not cause sedations or hallucinations which the oral therapies may cause.  Description of procedure:  The patient was placed in a sitting position. The standard protocol was used for Botox as follows, with 5 units of Botox injected at each site:   -  Procerus muscle, midline injection  -Corrugator muscle, bilateral injection  -Frontalis muscle, bilateral injection, with 2 sites each side, medial injection was performed in the upper one third of the frontalis muscle, in  the region vertical from the medial inferior edge of the superior orbital rim. The lateral injection was again in the upper one third of the forehead vertically above the lateral limbus of the cornea, 1.5 cm lateral to the medial injection site.  -Temporalis muscle injection, 4 sites, bilaterally. The first injection was 3 cm above the tragus of the ear, second injection site was 1.5 cm to 3 cm up from the first injection site in line with the tragus of the ear. The third injection site was 1.5-3 cm forward between the first 2 injection sites. The fourth injection site was 1.5 cm posterior to the second injection site.   -Occipitalis muscle injection, 3 sites, bilaterally. The first injection was done one half way between the occipital protuberance and the tip of the mastoid process behind the ear. The second injection site was done lateral and superior to the first, 1 fingerbreadth from the first injection. The third injection site was 1 fingerbreadth superiorly and medially from the first injection site.  -Cervical paraspinal muscle injection, 2 sites, bilateral knee first injection site was 1 cm from the midline of the cervical spine, 3 cm inferior to the lower border of the occipital protuberance. The second injection site was 1.5 cm superiorly and laterally to the first injection site.  -Trapezius muscle injection was performed at 3 sites, bilaterally. The first injection site was in the upper trapezius muscle halfway between the inflection point of the neck, and the acromion. The second injection site was one half way between the acromion and the first injection site. The third injection was done between the first injection site and the inflection point of the neck.   Will return for repeat injection in 3 months.   200 units of Botox was used, any Botox not injected was wasted. The patient tolerated the procedure well, there were no complications of the above procedure.

## 2020-01-16 ENCOUNTER — Ambulatory Visit: Payer: Medicare HMO | Admitting: Neurology

## 2020-01-16 ENCOUNTER — Other Ambulatory Visit: Payer: Self-pay

## 2020-01-16 DIAGNOSIS — G43709 Chronic migraine without aura, not intractable, without status migrainosus: Secondary | ICD-10-CM

## 2020-01-16 NOTE — Progress Notes (Signed)
Botox- 200 units x 1 vial Lot: [id number] Expiration: 10/2022 NDC: 0023-3921-02  Bacteriostatic 0.9% Sodium Chloride- 4mL total Lot: [id number] Expiration: 04/05/2021 NDC: 0409-1966-02  Dx: G43.709 B/B  

## 2020-01-23 DIAGNOSIS — R079 Chest pain, unspecified: Secondary | ICD-10-CM | POA: Diagnosis not present

## 2020-01-30 ENCOUNTER — Other Ambulatory Visit: Payer: Self-pay | Admitting: Internal Medicine

## 2020-01-30 DIAGNOSIS — I10 Essential (primary) hypertension: Secondary | ICD-10-CM

## 2020-01-30 NOTE — Telephone Encounter (Signed)
Requested Prescriptions  Pending Prescriptions Disp Refills   Potassium Chloride ER 20 MEQ TBCR [Pharmacy Med Name: POTASSIUM CHLORIDE 20MEQ ER TABLETS] 90 tablet 0    Sig: TAKE 1 TABLET BY MOUTH DAILY     Endocrinology:  Minerals - Potassium Supplementation Passed - 01/30/2020  6:21 AM      Passed - K in normal range and within 360 days    Potassium  Date Value Ref Range Status  12/04/2019 4.3 3.5 - 5.1 mmol/L Final         Passed - Cr in normal range and within 360 days    Creat  Date Value Ref Range Status  05/12/2016 1.14 (H) 0.50 - 0.99 mg/dL Final    Comment:      For patients > or = 68 years of age: The upper reference limit for Creatinine is approximately 13% higher for people identified as African-American.      Creatinine, Ser  Date Value Ref Range Status  12/04/2019 0.92 0.44 - 1.00 mg/dL Final   Creatinine, Urine  Date Value Ref Range Status  11/27/2019 146.00 mg/dL Final    Comment:    Performed at [facility], [place] [address], [place], [place] [address]         Passed - Valid encounter within last 12 months    Recent Outpatient Visits          1 month ago Type 2 diabetes mellitus with stage 2 chronic kidney disease, unspecified whether long term insulin use [facility])   [place], [place], [place]   2 months ago Need for vaccination against Streptococcus pneumoniae   [place], Stephen L, RPH-CPP   2 months ago 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, with long-term current use of insulin, macular edema presence unspecified, unspecified retinopathy severity ([place])   [place], MD   5 months ago Chest pain, unspecified type   [place], Levada Dy M, [place]   7 months ago 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, with long-term current use of  insulin, macular edema presence unspecified, unspecified retinopathy severity [facility])   [place], MD      Future Appointments            In 2 months Patwardhan, Reynold Bowen, MD [facility]

## 2020-02-11 ENCOUNTER — Other Ambulatory Visit: Payer: Self-pay | Admitting: Internal Medicine

## 2020-02-11 DIAGNOSIS — K219 Gastro-esophageal reflux disease without esophagitis: Secondary | ICD-10-CM

## 2020-02-23 DIAGNOSIS — I509 Heart failure, unspecified: Secondary | ICD-10-CM | POA: Diagnosis not present

## 2020-02-23 DIAGNOSIS — R079 Chest pain, unspecified: Secondary | ICD-10-CM | POA: Diagnosis not present

## 2020-02-27 NOTE — Progress Notes (Signed)
@Patient  ID: Heidi [name], female    DOB: 08-30-1950, 69 y.o.   MRN: [id number]  Chief Complaint  Patient presents with  . Follow-up    Asthma    Referring provider: Ladell Pier, MD  HPI:  69 year old female never smoker  PMH: CHF, type 2 diabetes, hyperlipidemia, GERD, anxiety and depression Smoker/ Smoking History: Never smoker Maintenance:  Symbicort 80 Pt of: Dr. Melvyn Novas  02/28/2020  - Visit   69 year old female never smoker presenting to office today as a follow-up visit.  She was last seen in our office in April/2019 by Dr. Melvyn Novas.  She reports that she is not able to take her Symbicort 80 regularly due to its high cost.  Symbicort 80 cost around $45 a month for the patient.  She is currently taking it as needed.  She continues to report ongoing dyspnea on exertion.  We will discuss and evaluate this today.  Patient is also been followed by primary care, cardiology and neurology recently.  Most recently she saw cardiology Dr. Junius Finner in June/2021 the assessment and plan from that office visit is listed below:  69 y.o. African American female with hypertension, type 2 DM with retinopathy, hyperlipidemia, CKD stage III, OSA, obesity, referred for chest pain and shortness of breath.   Shortness of breath: Likely multifactorial including obesity, deconditioning, unclear pulmonary pathology, possible diastolic heart failure.  It is worthwhile noting any however, that her BNP is always been normal, there is no evidence of pulmonary hypertension on echocardiogram, stress test shows no ischemia. Continue Lasix 40 mg daily for now.  Recommend follow-up with pulmonology.  No clear pulmonary pathology is identified, and if her shortness of breath and oxygen requirement continue to be an issue, could perform right heart catheterization to establish her cardiac filling pressures.  Management of other medical comorbidities as per PCP.\  I will see her back in 3 months.  Patient  carries a history of having obstructive sleep apnea.  She is unsure the severity.  She reports that this was back in [place] when she was treated with a Pap device.  She is unsure if it was a CPAP or BiPAP.  We do not have the sleep study on file.  Patient reports that she stopped using it because she felt that the risk of having a dirty Pap device was a higher risk factor then untreated sleep apnea.  We will discuss and evaluate this today.  We also discussed resources we may be able to help the patient have access to to help with medication cost.  Walk today in office patient did require 1 L of O2 with physical exertion.  Patient walked in an extremely slow pace.  Patient admits to leading a sedentary lifestyle.  She is due for follow-up with primary care.  Questionaires / Pulmonary Flowsheets:   ACT:  Asthma Control Test ACT Total Score  02/28/2020 16    MMRC: No flowsheet data found.  Epworth:  No flowsheet data found.  Tests:   FENO:  Lab Results  Component Value Date   NITRICOXIDE 8 09/09/2017    PFT: PFT Results Latest Ref Rng & Units 10/27/2017  FVC-Pre L 1.24  FVC-Predicted Pre % 49  FVC-Post L 1.14  FVC-Predicted Post % 45  Pre FEV1/FVC % % 86  Post FEV1/FCV % % 87  FEV1-Pre L 1.07  FEV1-Predicted Pre % 54  FEV1-Post L 1.00  DLCO uncorrected ml/min/mmHg 9.77  DLCO UNC% % 40  DLVA Predicted % 116  TLC L 3.04  TLC % Predicted % 60  RV % Predicted % 62    WALK:  SIX MIN WALK 02/28/2020  Supplimental Oxygen during Test? (L/min) Yes  O2 Flow Rate 1  Type Continuous    Imaging: No results found.  Lab Results:  CBC    Component Value Date/Time   WBC 11.2 (H) 11/28/2019 0316   RBC 4.80 11/28/2019 0316   HGB 13.1 11/28/2019 0316   HGB 13.2 08/23/2017 1655   HCT 43.0 11/28/2019 0316   HCT 38.8 08/23/2017 1655   PLT 229 11/28/2019 0316   MCV 89.6 11/28/2019 0316   MCV 83 08/23/2017 1655   MCH 27.3 11/28/2019 0316   MCHC 30.5 11/28/2019 0316   RDW  14.9 11/28/2019 0316   RDW 15.0 08/23/2017 1655   LYMPHSABS 2.9 11/28/2019 0316   LYMPHSABS 4.3 (H) 08/23/2017 1655   MONOABS 0.9 11/28/2019 0316   EOSABS 0.1 11/28/2019 0316   EOSABS 0.4 08/23/2017 1655   BASOSABS 0.0 11/28/2019 0316   BASOSABS 0.1 08/23/2017 1655    BMET    Component Value Date/Time   NA 139 12/04/2019 0325   NA 146 (H) 11/13/2019 1606   K 4.3 12/04/2019 0325   CL 101 12/04/2019 0325   CO2 29 12/04/2019 0325   GLUCOSE 144 (H) 12/04/2019 0325   BUN 17 12/04/2019 0325   BUN 14 11/13/2019 1606   CREATININE 0.92 12/04/2019 0325   CREATININE 1.14 (H) 05/12/2016 1648   CALCIUM 9.3 12/04/2019 0325   GFRNONAA >60 12/04/2019 0325   GFRNONAA 51 (L) 05/12/2016 1648   GFRAA >60 12/04/2019 0325   GFRAA 59 (L) 05/12/2016 1648    BNP    Component Value Date/Time   BNP 19.7 11/24/2019 1033    ProBNP No results found for: PROBNP  Specialty Problems      Pulmonary Problems   Obesity hypoventilation syndrome (HCC)   OSA treated with BiPAP   Asthma, chronic   Mild persistent asthma without complication   Cough variant asthma vs UACS/vcd on ACEi     - pfts 12/12/13 done while sympt on ACEi should no obst or flattening of f/v curve - Spirometry 09/09/2017  FEV1 0.87 (44%)  Ratio 78 with min curvature w/in 2 h of study with symptoms at time of study  - FENO 09/09/2017  =   8  - trial off acei 09/09/2017   - PFT's  10/27/2017  FEV1 1.07 (54 % ) ratio 86  p no % improvement from saba p nothing  prior to study with DLCO  40 % corrects to 116  % for alv volume  - Allergy profile 10/27/2017 >  Eos 0. /  IgE         Upper airway cough syndrome   Chronic respiratory failure with hypoxia, on home oxygen therapy (HCC)   Acute respiratory failure (HCC)   Multifocal pneumonia   Exertional dyspnea      Allergies  Allergen Reactions  . Januvia [Sitagliptin]     Chest pains    Immunization History  Administered Date(s) Administered  . Influenza Whole 05/11/2014  .  Influenza, High Dose Seasonal PF 04/08/2018, 02/25/2019  . Influenza,inj,Quad PF,6+ Mos 03/26/2015, 05/12/2016, 04/18/2017  . Pneumococcal Conjugate-13 01/14/2015  . Pneumococcal Polysaccharide-23 12/19/2013, 11/13/2019  . Tdap 11/20/2016    Past Medical History:  Diagnosis Date  . Adenomatous colon polyp   . Anemia   . Anxiety   . Asthma   . CHF (congestive heart failure) (  [place])   . Diabetes mellitus without complication ([place])   . High cholesterol   . Hypertension   . Left knee injury   . Pneumonia     Tobacco History: Social History   Tobacco Use  Smoking Status Never Smoker  Smokeless Tobacco Never Used   Counseling given: Yes   Continue to not smoke  Outpatient Encounter Medications as of 02/28/2020  Medication Sig  . albuterol (PROVENTIL) (2.5 MG/3ML) 0.083% nebulizer solution USE 1 VIAL VIA NEBULIZER EVERY 6 HOURS AS NEEDED FOR WHEEZING OR SHORTNESS OF BREATH (Patient taking differently: Take 2.5 mg by nebulization every 6 (six) hours as needed for wheezing or shortness of breath. )  . albuterol (VENTOLIN HFA) 108 (90 Base) MCG/ACT inhaler Inhale 1-2 puffs into the lungs every 6 (six) hours as needed for wheezing or shortness of breath.  Marland Kitchen aspirin 81 MG chewable tablet Chew 81 mg by mouth daily.   Marland Kitchen atenolol (TENORMIN) 25 MG tablet Take 1 tablet (25 mg total) by mouth daily.  Marland Kitchen atorvastatin (LIPITOR) 40 MG tablet TAKE 1 TABLET BY MOUTH DAILY AT 6 PM (Patient taking differently: Take 40 mg by mouth daily. )  . budesonide-formoterol (SYMBICORT) 80-4.5 MCG/ACT inhaler Inhale 2 puffs into the lungs 2 (two) times daily.  Marland Kitchen Fexofenadine HCl (MUCINEX ALLERGY PO) Take 1 tablet by mouth daily as needed (for allergy).  . furosemide (LASIX) 40 MG tablet Take 40mg  twice daily for 5 days and then take 40mg  once daily.  Marland Kitchen glucose blood (ONETOUCH VERIO) test strip Use as directed to test blood sugar three times daily DX E11.65  . insulin aspart protamine- aspart (NOVOLOG MIX 70/30)  (70-30) 100 UNIT/ML injection Inject 0.45-0.7 mLs (45-70 Units total) into the skin See admin instructions. Inject 70 units in the morning and 45 units in the evening.  . INSULIN SYRINGE 1CC/29G 29G X 1/2" 1 ML MISC USE THREE TIMES DAILY AS DIRECTED  . losartan (COZAAR) 50 MG tablet Take 1 tablet (50 mg total) by mouth daily.  . montelukast (SINGULAIR) 10 MG tablet Take 1 tablet (10 mg total) by mouth at bedtime. (Patient taking differently: Take 10 mg by mouth daily. )  . Multiple Vitamin (MULTIVITAMIN WITH MINERALS) TABS tablet Take 1 tablet by mouth daily.  . nitroGLYCERIN (NITROSTAT) 0.3 MG SL tablet Place 1 tablet (0.3 mg total) under the tongue every 5 (five) minutes as needed for chest pain. Max 3 tablets in 15 minutes  . olopatadine (PATANOL) 0.1 % ophthalmic solution Place 1 drop into both eyes 2 (two) times daily.  Glory Rosebush Delica Lancets 16B MISC . Use as directed to test blood sugar three times daily DX E11.65  . pantoprazole (PROTONIX) 40 MG tablet TAKE 1 TABLET(40 MG) BY MOUTH DAILY  . Potassium Chloride ER 20 MEQ TBCR TAKE 1 TABLET BY MOUTH DAILY  . tetrahydrozoline-zinc (VISINE-AC) 0.05-0.25 % ophthalmic solution Place 1 drop into both eyes 3 (three) times daily as needed.  . topiramate (TOPAMAX) 25 MG capsule Take 3 capsules (75 mg total) by mouth 2 (two) times daily.  . traMADol (ULTRAM) 50 MG tablet Take 1 tablet (50 mg total) by mouth every 8 (eight) hours as needed.   No facility-administered encounter medications on file as of 02/28/2020.     Review of Systems  Review of Systems  Constitutional: Positive for fatigue. Negative for activity change and fever.  HENT: Negative for sinus pressure, sinus pain and sore throat.   Respiratory: Positive for cough and shortness of breath.  Negative for wheezing.   Cardiovascular: Positive for leg swelling. Negative for chest pain and palpitations.  Gastrointestinal: Negative for nausea and vomiting.  Musculoskeletal: Negative for  arthralgias.  Neurological: Negative for dizziness.  Psychiatric/Behavioral: Negative for sleep disturbance. The patient is not nervous/anxious.      Physical Exam  BP 120/70 (BP Location: Left Arm, Cuff Size: Normal)   Pulse 74   Temp (!) 97.3 F (36.3 C) (Oral)   Ht 5\' 3"  (1.6 m)   Wt 242 lb 11.2 oz (110.1 kg)   SpO2 94%   BMI 42.99 kg/m   Wt Readings from Last 5 Encounters:  02/28/20 242 lb 11.2 oz (110.1 kg)  01/03/20 237 lb (107.5 kg)  12/28/19 240 lb 2 oz (108.9 kg)  12/04/19 245 lb 6 oz (111.3 kg)  11/22/19 240 lb (108.9 kg)    BMI Readings from Last 5 Encounters:  02/28/20 42.99 kg/m  01/03/20 40.68 kg/m  12/28/19 41.22 kg/m  12/04/19 43.47 kg/m  11/22/19 42.51 kg/m     Physical Exam Vitals and nursing note reviewed.  Constitutional:      General: She is not in acute distress.    Appearance: Normal appearance. She is obese.  HENT:     Head: Normocephalic and atraumatic.     Right Ear: External ear normal.     Left Ear: External ear normal.     Nose: Nose normal. No congestion.     Mouth/Throat:     Mouth: Mucous membranes are moist.     Pharynx: Oropharynx is clear.  Eyes:     Pupils: Pupils are equal, round, and reactive to light.  Cardiovascular:     Rate and Rhythm: Normal rate and regular rhythm.     Pulses: Normal pulses.     Heart sounds: Normal heart sounds. No murmur heard.   Pulmonary:     Breath sounds: Normal breath sounds. No decreased air movement. No decreased breath sounds, wheezing or rales.  Musculoskeletal:     Cervical back: Normal range of motion.     Right lower leg: Edema (1+) present.     Left lower leg: Edema (1+) present.  Skin:    General: Skin is warm and dry.     Capillary Refill: Capillary refill takes less than 2 seconds.  Neurological:     General: No focal deficit present.     Mental Status: She is alert and oriented to person, place, and time. Mental status is at baseline.     Gait: Gait (Completed walk in  office, short distance, slow pace) normal.  Psychiatric:        Mood and Affect: Mood normal. Affect is flat.        Speech: Speech is delayed.        Behavior: Behavior is slowed.        Thought Content: Thought content normal.        Judgment: Judgment normal.       Assessment & Plan:   CHF (congestive heart failure) Plan: Continue follow-up with cardiology Continue diuretics Schedule follow-up with primary care Keep follow-up with cardiology in September/2021  Obesity hypoventilation syndrome Suspected obstructive sleep apnea Suspected obesity hypoventilation syndrome Patient reports that she was previously treated with PAP device, she stopped using it  Plan: We will coordinate consult with a sleep MD to further review/potentially order repeat sleep study  Asthma, chronic Patient carries clinical history of asthma Currently using Symbicort 80 as needed -due to high financial cost of $45  a month Breath sounds clear to auscultation today  Plan: Walk today in office We will coordinate clinical pharmacy team visit to review medications as well as inhaler use 6 to 8-week follow-up with Dr. Melvyn Novas with all medications on hand  Chronic respiratory failure with hypoxia, on home oxygen therapy [facility]) Patient reports that since May/2021 hospitalization she is maintained on home oxygen She reports that she was assessed by primary care in May/2021 and they reported that her resting oxygen levels were 85% Patient was walked in office today required 1 L of O2 with physical exertion  Plan: Continue oxygen therapy as prescribed-1 L Chest x-ray today   Exertional dyspnea Believe patient's exertional dyspnea is multifactorial given morbid obesity, likely untreated sleep apnea, potential obesity hypoventilation syndrome, clinical history of asthma, suspected diastolic dysfunction, sedentary lifestyle  Plan: Patient will need to reestablish care with Dr. Melvyn Novas We will have patient  come in to see his sleep MD for further evaluation of suspected untreated sleep apnea Keep follow-up with cardiology I have encouraged patient to schedule follow-up with primary care  Medication management Plan: We will coordinate clinical pharmacy team visit in our office for med reconciliation as well as to review inhaler use We will [facility] to help patient with medication cost and access    Return in about 2 months (around 04/29/2020), or if symptoms worsen or fail to improve, for Follow up with Dr. Melvyn Novas.   Lauraine Rinne, NP 02/28/2020   This appointment required 32 minutes of patient care (this includes precharting, chart review, review of results, face-to-face care, etc.).

## 2020-02-28 ENCOUNTER — Ambulatory Visit (INDEPENDENT_AMBULATORY_CARE_PROVIDER_SITE_OTHER): Payer: Medicare HMO

## 2020-02-28 ENCOUNTER — Encounter: Payer: Self-pay | Admitting: Pulmonary Disease

## 2020-02-28 ENCOUNTER — Other Ambulatory Visit: Payer: Self-pay

## 2020-02-28 ENCOUNTER — Ambulatory Visit: Payer: Medicare HMO | Admitting: Pulmonary Disease

## 2020-02-28 VITALS — BP 120/70 | HR 74 | Temp 97.3°F | Ht 63.0 in | Wt 242.7 lb

## 2020-02-28 DIAGNOSIS — E662 Morbid (severe) obesity with alveolar hypoventilation: Secondary | ICD-10-CM

## 2020-02-28 DIAGNOSIS — Z9981 Dependence on supplemental oxygen: Secondary | ICD-10-CM | POA: Diagnosis not present

## 2020-02-28 DIAGNOSIS — R0609 Other forms of dyspnea: Secondary | ICD-10-CM

## 2020-02-28 DIAGNOSIS — J9611 Chronic respiratory failure with hypoxia: Secondary | ICD-10-CM | POA: Diagnosis not present

## 2020-02-28 DIAGNOSIS — I517 Cardiomegaly: Secondary | ICD-10-CM | POA: Diagnosis not present

## 2020-02-28 DIAGNOSIS — Z79899 Other long term (current) drug therapy: Secondary | ICD-10-CM

## 2020-02-28 DIAGNOSIS — J4550 Severe persistent asthma, uncomplicated: Secondary | ICD-10-CM

## 2020-02-28 DIAGNOSIS — R0600 Dyspnea, unspecified: Secondary | ICD-10-CM | POA: Diagnosis not present

## 2020-02-28 DIAGNOSIS — I5030 Unspecified diastolic (congestive) heart failure: Secondary | ICD-10-CM | POA: Diagnosis not present

## 2020-02-28 DIAGNOSIS — J45909 Unspecified asthma, uncomplicated: Secondary | ICD-10-CM | POA: Diagnosis not present

## 2020-02-28 NOTE — Assessment & Plan Note (Signed)
Plan: We will coordinate clinical pharmacy team visit in our office for med reconciliation as well as to review inhaler use We will refer to [facility] to help patient with medication cost and access

## 2020-02-28 NOTE — Assessment & Plan Note (Addendum)
Patient reports that since May/2021 hospitalization she is maintained on home oxygen She reports that she was assessed by primary care in May/2021 and they reported that her resting oxygen levels were 85% Patient was walked in office today required 1 L of O2 with physical exertion  Plan: Continue oxygen therapy as prescribed-1 L Chest x-ray today

## 2020-02-28 NOTE — Assessment & Plan Note (Addendum)
Believe patient's exertional dyspnea is multifactorial given morbid obesity, likely untreated sleep apnea, potential obesity hypoventilation syndrome, clinical history of asthma, suspected diastolic dysfunction, sedentary lifestyle  Plan: Patient will need to reestablish care with Dr. Melvyn Novas We will have patient come in to see his sleep MD for further evaluation of suspected untreated sleep apnea Keep follow-up with cardiology I have encouraged patient to schedule follow-up with primary care

## 2020-02-28 NOTE — Assessment & Plan Note (Signed)
Suspected obstructive sleep apnea Suspected obesity hypoventilation syndrome Patient reports that she was previously treated with PAP device, she stopped using it  Plan: We will coordinate consult with a sleep MD to further review/potentially order repeat sleep study

## 2020-02-28 NOTE — Assessment & Plan Note (Signed)
Patient carries clinical history of asthma Currently using Symbicort 80 as needed -due to high financial cost of $45 a month Breath sounds clear to auscultation today  Plan: Walk today in office We will coordinate clinical pharmacy team visit to review medications as well as inhaler use 6 to 8-week follow-up with Heidi [name] with all medications on hand

## 2020-02-28 NOTE — Assessment & Plan Note (Signed)
Plan: Continue follow-up with cardiology Continue diuretics Schedule follow-up with primary care Keep follow-up with cardiology in September/2021

## 2020-02-28 NOTE — Patient Instructions (Addendum)
You were seen today by Lauraine Rinne, NP  for:   1. Chronic asthma without complication  - DG Chest 2 View; Future - AMB Referral to [facility] Care Management  Continue Symbicort 80 >>> 2 puffs in the morning right when you wake up, rinse out your mouth after use, 12 hours later 2 puffs, rinse after use >>> Take this daily, no matter what >>> This is not a rescue inhaler   Only use your albuterol as a rescue medication to be used if you can't catch your breath by resting or doing a relaxed purse lip breathing pattern.  - The less you use it, the better it will work when you need it. - Ok to use up to 2 puffs  every 4 hours if you must but call for immediate appointment if use goes up over your usual need - Don't leave home without it !!  (think of it like the spare tire for your car)   We will have a case manager call you over the phone  We will also have a clinical pharmacist meet with you in our office  2. Exertional dyspnea  I believe your shortness of breath after having is being caused by a multitude of factors.  Believe we need to work on increasing your overall physical mobility.  We also need to have close follow-up with our specialist here both Dr. Melvyn Novas as well as a sleep MD.  3. Chronic respiratory failure with hypoxia, on home oxygen therapy ([place])  Walk today in office he required 1 L with physical exertion  Chest x-ray today  Continue oxygen therapy as prescribed  >>>maintain oxygen saturations greater than 88 percent  >>>if unable to maintain oxygen saturations please contact the office  >>>do not smoke with oxygen  >>>can use nasal saline gel or nasal saline rinses to moisturize nose if oxygen causes dryness   4. Medication management  - AMB Referral to [place] Management  Please present to our office in 2-4 weeks for an appointment with the clinical pharmacy team for:  Marland Kitchen Medication Management  . Medication reconciliation  . Medication Access  . Inhaler  teaching   SLEEP CONSULT - 60min slot   5. Diastolic congestive heart failure, unspecified HF chronicity ([place])  Keep follow-up with cardiology  Continue fluid pills as managed by cardiology  6. Obesity hypoventilation syndrome (HCC) /history of obstructive sleep apnea  Patient needs to schedule SLEEP CONSULT - 19min slot    We recommend today:  Orders Placed This Encounter  Procedures  . DG Chest 2 View    Standing Status:   Future    Standing Expiration Date:   08/30/2020    Order Specific Question:   Reason for Exam (SYMPTOM  OR DIAGNOSIS REQUIRED)    Answer:   doe / asthma    Order Specific Question:   Preferred imaging location?    Answer:   Internal    Order Specific Question:   Radiology Contrast Protocol - do NOT remove file path    Answer:   [email or web address]  . AMB Referral to [place] Management    Referral Priority:   Routine    Referral Type:   Consultation    Referral Reason:   [facility]-Care Management    Number of Visits Requested:   1   Orders Placed This Encounter  Procedures  . DG Chest 2 View  . AMB Referral to [place] Management   No orders of the defined types were  placed in this encounter.   Follow Up:    Return in about 2 months (around 04/29/2020), or if symptoms worsen or fail to improve, for Follow up with Dr. Melvyn Novas. >>> Bring all medications to this appointment, needs to be with Dr. Melvyn Novas.  Patient was last seen by Dr. Melvyn Novas in April/2019  Please present to our office in 2-4 weeks for an appointment with the clinical pharmacy team for:  Marland Kitchen Medication Management  . Medication reconciliation  . Medication Access  . Inhaler teaching   SLEEP CONSULT - 79min slot            Please do your part to reduce the spread of COVID-19:      Reduce your risk of any infection  and COVID19 by using the similar precautions used for avoiding the common cold or flu:  Marland Kitchen Wash your hands often with soap and warm  water for at least 20 seconds.  If soap and water are not readily available, use an alcohol-based hand sanitizer with at least 60% alcohol.  . If coughing or sneezing, cover your mouth and nose by coughing or sneezing into the elbow areas of your shirt or coat, into a tissue or into your sleeve (not your hands). Langley Gauss A MASK when in public  . Avoid shaking hands with others and consider head nods or verbal greetings only. . Avoid touching your eyes, nose, or mouth with unwashed hands.  . Avoid close contact with people who are sick. . Avoid places or events with large numbers of people in one location, like concerts or sporting events. . If you have some symptoms but not all symptoms, continue to monitor at home and seek medical attention if your symptoms worsen. . If you are having a medical emergency, call 911.   [place] / e-Visit: [email or web address]         [facility]: [facility]: [phone]                   [facility]: [phone]     It is flu season:   >>> Best ways to protect herself from the flu: Receive the yearly flu vaccine, practice good hand hygiene washing with soap and also using hand sanitizer when available, eat a nutritious meals, get adequate rest, hydrate appropriately   Please contact the office if your symptoms worsen or you have concerns that you are not improving.   Thank you for choosing [facility] for your healthcare, and for allowing [place] to partner with you on your healthcare journey. I am thankful to be able to provide care to you today.   Wyn Quaker FNP-C

## 2020-02-29 ENCOUNTER — Other Ambulatory Visit: Payer: Self-pay

## 2020-02-29 NOTE — Patient Outreach (Signed)
[facility] Care Management  02/29/2020  Kerensa Nicklas [birth year] [id number]   Telephone Screen  Referral Date: 02/28/2020 Referral Source: MD Office(Brian Mack,NP) Referral Reason: "Asthma, med cost" Insurance: [employer]   Outreach attempt #1 to patient. No answer. RN CM left HIPAA compliant voicemail message along with contact info.      Plan: RN CM will  make outreach attempt to patient within 3-4 business days. RN CM will send unsuccessful outreach letter to patient.   Enzo Montgomery, RN,BSN,CCM [place] Management Telephonic Care Management Coordinator Direct Phone: [phone] Toll Free: [phone] Fax: [phone]

## 2020-03-01 ENCOUNTER — Other Ambulatory Visit: Payer: Self-pay

## 2020-03-01 ENCOUNTER — Telehealth: Payer: Self-pay | Admitting: Internal Medicine

## 2020-03-01 NOTE — Telephone Encounter (Signed)
Contacted pt and made aware that Dr. Wynetta Emery is booked out all the way till the end of Oct. Made pt aware that I can schedule her an appt with an provider that has an availability. Transferred pt to [place] to schedule

## 2020-03-01 NOTE — Telephone Encounter (Signed)
Lauraine Rinne, NP  02/29/2020 9:32 AM EDT     Chest x-ray stable. Likely fluid on lungs. Keep follow-up with cardiology and primary care.  No new recommendations.  Wyn Quaker, FNP   Attempted to call pt but unable to reach. Left pt a detailed message in regards to her cxr as well as her upcoming sleep consult with CY 9/1. Nothing further needed.

## 2020-03-01 NOTE — Telephone Encounter (Signed)
Patient called to schedule an appt. With Dr. Wynetta Emery as soon as possible because her lung doctor recommended her to see her PCP.  Patient does not want to see another doctor and really feels she needs to see her PCP.  Would like to know if she can be worked in.  CB# [phone]

## 2020-03-01 NOTE — Patient Outreach (Signed)
[facility]) Care Management  03/01/2020  Heidi [name] [birth year] [id number]   Telephone Screen  Referral Date: 02/28/2020 Referral Source: MD Office(Brian Mack,NP) Referral Reason: "Asthma, med cost" Insurance: [employer]    Outreach attempt #2 to patient. No answer after several rings.      Plan: RN CM will make outreach attempt to patient within 3-4 business days.    Enzo Montgomery, RN,BSN,CCM [place] Management Telephonic Care Management Coordinator Direct Phone: [phone] Toll Free: [phone] Fax: [phone]

## 2020-03-05 ENCOUNTER — Other Ambulatory Visit: Payer: Self-pay

## 2020-03-05 NOTE — Patient Outreach (Signed)
[facility]) Care Management  03/05/2020  Heidi [name] 01/04/1951 [id number]   Telephone Screen  Referral Date:02/28/2020 Referral Source:MD Office(Brian Mack,NP) Referral Reason:"Asthma, med cost" Insurance:[employer]   Outreach attempt #3 to patient. No answer at present. RN CM left HIPAA compliant voicemail message along with contact info.      Plan: RN CM will make outreach attempt to patient within 3-4 wks if no response from letter mailed to patient.  Enzo Montgomery, RN,BSN,CCM [place] Management Telephonic Care Management Coordinator Direct Phone: [phone] Toll Free: [phone] Fax: [phone]

## 2020-03-06 ENCOUNTER — Institutional Professional Consult (permissible substitution): Payer: Medicare HMO | Admitting: Internal Medicine

## 2020-03-12 ENCOUNTER — Telehealth: Payer: Self-pay | Admitting: Internal Medicine

## 2020-03-12 NOTE — Telephone Encounter (Signed)
Please advise.   Copied from [place] [id number]. Topic: General - Other >> Mar 12, 2020  4:27 PM Leward Quan A wrote: Reason for CRM: Patient called to request an Rx for furosemide (LASIX) 40 MG tablet, say that she have misplaced the bottle that she had and need these pills. Please call Ph# [phone] today thank you   [id number], [place]  Phone:  [phone]  Fax:  [phone]

## 2020-03-18 ENCOUNTER — Other Ambulatory Visit: Payer: Self-pay

## 2020-03-18 ENCOUNTER — Ambulatory Visit: Payer: Medicare HMO | Admitting: Pharmacist

## 2020-03-18 DIAGNOSIS — Z7189 Other specified counseling: Secondary | ICD-10-CM | POA: Insufficient documentation

## 2020-03-18 DIAGNOSIS — J4550 Severe persistent asthma, uncomplicated: Secondary | ICD-10-CM

## 2020-03-18 MED ORDER — MONTELUKAST SODIUM 10 MG PO TABS: 10.0000 mg | ORAL_TABLET | Freq: Every day | ORAL | 2 refills | Status: DC

## 2020-03-18 NOTE — Progress Notes (Signed)
HPI Patient presents today to [facility] referred by Wyn Quaker, NP to see pharmacy team for Symbicort patient assistance.  Past medical history includes asthma, CHF, HTN, migraines, OSA w/BiPAP, GERD, DM2, CKD2, HLD, anxiety and depression.  Today, patient reports last use of Symbicort was 2 days ago. Reports using as needed to preserve medication due to expensive copay cost of $45/month. Also, cannot afford albuterol inhaler copay cost of $45. Requests refills for montelukast, patient could not recall last time used.   Respiratory Medications Current: Symbicort 80-4.5 mcg BID, montelukast 10 mg daily, albuterol PRN Tried in past: Advair 500-50 mcg BID, Dulera 100-5 mcg BID Patient reports adherence challenges - unable to afford albuterol and Symbicort. Also, not taking montelukast (reason unknown)  OBJECTIVE Allergies  Allergen Reactions  . Januvia [Sitagliptin]     Chest pains    Outpatient Encounter Medications as of 03/18/2020  Medication Sig  . albuterol (PROVENTIL) (2.5 MG/3ML) 0.083% nebulizer solution USE 1 VIAL VIA NEBULIZER EVERY 6 HOURS AS NEEDED FOR WHEEZING OR SHORTNESS OF BREATH (Patient taking differently: Take 2.5 mg by nebulization every 6 (six) hours as needed for wheezing or shortness of breath. )  . albuterol (VENTOLIN HFA) 108 (90 Base) MCG/ACT inhaler Inhale 1-2 puffs into the lungs every 6 (six) hours as needed for wheezing or shortness of breath.  Marland Kitchen aspirin 81 MG chewable tablet Chew 81 mg by mouth daily.   Marland Kitchen atenolol (TENORMIN) 25 MG tablet Take 1 tablet (25 mg total) by mouth daily.  Marland Kitchen atorvastatin (LIPITOR) 40 MG tablet TAKE 1 TABLET BY MOUTH DAILY AT 6 PM (Patient taking differently: Take 40 mg by mouth daily. )  . budesonide-formoterol (SYMBICORT) 80-4.5 MCG/ACT inhaler Inhale 2 puffs into the lungs 2 (two) times daily.  Marland Kitchen Fexofenadine HCl (MUCINEX ALLERGY PO) Take 1 tablet by mouth daily as needed (for allergy).  . furosemide (LASIX) 40 MG tablet  Take 40mg  twice daily for 5 days and then take 40mg  once daily.  Marland Kitchen glucose blood (ONETOUCH VERIO) test strip Use as directed to test blood sugar three times daily DX E11.65  . insulin aspart protamine- aspart (NOVOLOG MIX 70/30) (70-30) 100 UNIT/ML injection Inject 0.45-0.7 mLs (45-70 Units total) into the skin See admin instructions. Inject 70 units in the morning and 45 units in the evening.  . INSULIN SYRINGE 1CC/29G 29G X 1/2" 1 ML MISC USE THREE TIMES DAILY AS DIRECTED  . losartan (COZAAR) 50 MG tablet Take 1 tablet (50 mg total) by mouth daily.  . montelukast (SINGULAIR) 10 MG tablet Take 1 tablet (10 mg total) by mouth at bedtime. (Patient taking differently: Take 10 mg by mouth daily. )  . Multiple Vitamin (MULTIVITAMIN WITH MINERALS) TABS tablet Take 1 tablet by mouth daily.  . nitroGLYCERIN (NITROSTAT) 0.3 MG SL tablet Place 1 tablet (0.3 mg total) under the tongue every 5 (five) minutes as needed for chest pain. Max 3 tablets in 15 minutes  . olopatadine (PATANOL) 0.1 % ophthalmic solution Place 1 drop into both eyes 2 (two) times daily.  Glory Rosebush Delica Lancets 74J MISC . Use as directed to test blood sugar three times daily DX E11.65  . pantoprazole (PROTONIX) 40 MG tablet TAKE 1 TABLET(40 MG) BY MOUTH DAILY  . Potassium Chloride ER 20 MEQ TBCR TAKE 1 TABLET BY MOUTH DAILY  . tetrahydrozoline-zinc (VISINE-AC) 0.05-0.25 % ophthalmic solution Place 1 drop into both eyes 3 (three) times daily as needed.  . topiramate (TOPAMAX) 25 MG capsule Take 3  capsules (75 mg total) by mouth 2 (two) times daily.  . traMADol (ULTRAM) 50 MG tablet Take 1 tablet (50 mg total) by mouth every 8 (eight) hours as needed.   No facility-administered encounter medications on file as of 03/18/2020.     Immunization History  Administered Date(s) Administered  . Influenza Whole 05/11/2014  . Influenza, High Dose Seasonal PF 04/08/2018, 02/25/2019  . Influenza,inj,Quad PF,6+ Mos 03/26/2015, 05/12/2016,  04/18/2017  . Pneumococcal Conjugate-13 01/14/2015  . Pneumococcal Polysaccharide-23 12/19/2013, 11/13/2019  . Tdap 11/20/2016     PFTs PFT Results Latest Ref Rng & Units 10/27/2017  FVC-Pre L 1.24  FVC-Predicted Pre % 49  FVC-Post L 1.14  FVC-Predicted Post % 45  Pre FEV1/FVC % % 86  Post FEV1/FCV % % 87  FEV1-Pre L 1.07  FEV1-Predicted Pre % 54  FEV1-Post L 1.00  DLCO uncorrected ml/min/mmHg 9.77  DLCO UNC% % 40  DLVA Predicted % 116  TLC L 3.04  TLC % Predicted % 60  RV % Predicted % 62    Eosinophils Most recent blood eosinophil count was 0.1 cells/microL taken on 11/28/19.   Assessment   1. Symbicort and Albuterol Patient Assistance  Patient non-adherent with Symbicort 80-4.5 mcg-2 puffs BID and albuterol PRN due to unaffordable copay costs. Patient filled out [place] patient assistance application for Symbicort and [employer] for you patient assistance application of Ventolin HFA. Instructed patient to contact her pharmacy for documentation of all out-of-pock costs on prescription medications for the current calender year and return paperwork to [facility]. Patient verablized understanding. Once received, [place] will fax both PAP applications to their respective companies.   Dicussed the proper inhaler technique using Symbicort demo inhaler. Patient able to demonstrate proper inhaler technique using teach back method. Instructed patient to rinse mouth with water after using in order to prevent fungal infection. Patient verbalized understanding.  Sent prescription refills to preferred pharmacy for montelukast 10 mg daily.   2. Medication Reconciliation  A drug regimen assessment was performed, including review of allergies, interactions, disease-state management, dosing and immunization history. Medications were reviewed with the patient, including name, instructions, indication, goals of therapy, potential side effects, importance of adherence, and safe use.  Drug  interaction(s): no major drug interactions found  3. Immunizations  Patient is indicated for the influenzae and shingles vaccinations.  PLAN 1. Continue Symbicort 80-4.5 mcg 2 puffs twice daily 2. Fax patient assistance application to AZ[employer]ME for Symbicort approval  3. Fax patient assistance application to [place] for Ventolin HFA approval   All questions encouraged and answered.  Instructed patient to reach out with any further questions or concerns.  Thank you for allowing pharmacy to participate in this patient's care.  This appointment required 60 minutes of patient care (this includes precharting, chart review, review of results, face-to-face care, etc.).  Lorel Monaco, PharmD PGY2 Ambulatory Care Resident [place]

## 2020-03-18 NOTE — Patient Instructions (Signed)
Thank you for meeting with the pharmacy team today!  Below find a summary of what we discussed at your visit:  CONTINUE Sybmicort 2 puff twice daily  We will apply for Symbicort and Ventolin patient assistance  CALL [phone] for status updates for assistance.    Can use a Good-Rx coupon for albuterol      Call [phone] with any questions or concerns.   Lorel Monaco, PharmD PGY2 Ambulatory Care Resident [place]

## 2020-03-20 ENCOUNTER — Other Ambulatory Visit: Payer: Self-pay | Admitting: Internal Medicine

## 2020-03-20 DIAGNOSIS — IMO0002 Reserved for concepts with insufficient information to code with codable children: Secondary | ICD-10-CM

## 2020-03-22 ENCOUNTER — Ambulatory Visit: Payer: Medicare HMO | Admitting: Internal Medicine

## 2020-03-25 ENCOUNTER — Telehealth: Payer: Self-pay | Admitting: Internal Medicine

## 2020-03-25 DIAGNOSIS — R079 Chest pain, unspecified: Secondary | ICD-10-CM | POA: Diagnosis not present

## 2020-03-25 DIAGNOSIS — I509 Heart failure, unspecified: Secondary | ICD-10-CM | POA: Diagnosis not present

## 2020-03-25 NOTE — Telephone Encounter (Signed)
Copied from [place] [id number]. Topic: Quick Communication - See Telephone Encounter >> Mar 25, 2020  4:37 PM L[place] Boston wrote: CRM for notification. See Telephone encounter for: 03/25/20. Pt is wanting someone to call her as pharmacy states thaey are not being contacted about pt diabetic scripts being honored. Pt just cancelled an appt on Friday. Pt was last seen in May 2021. Pt feels that an appt should not have anything to do with why she is not getting scripts approved. Pt wants FU

## 2020-03-25 NOTE — Telephone Encounter (Signed)
Lurena Joiner could you please follow up

## 2020-03-26 ENCOUNTER — Other Ambulatory Visit: Payer: Self-pay

## 2020-03-26 MED ORDER — ONETOUCH VERIO VI STRP: ORAL_STRIP | 6 refills | Status: DC

## 2020-03-26 NOTE — Telephone Encounter (Signed)
Rx sent 

## 2020-03-26 NOTE — Patient Outreach (Signed)
[facility]) Care Management  03/26/2020  Heidi [name] Sep 25, 1950 [id number]    Telephone Screen  Referral Date:02/28/2020 Referral Source:MD Office(Brian Mack,NP) Referral Reason:"Asthma, med cost" Insurance:[employer]     Multiple attempts to establish contact with patient without success. No response from letter mailed to patient. Case is being closed at this time.    Plan: RN CM will close case at this time. RN CM will send MD case closure letter.  Enzo Montgomery, RN,BSN,CCM [place] Management Telephonic Care Management Coordinator Direct Phone: [phone] Toll Free: [phone] Fax: [phone]

## 2020-03-30 ENCOUNTER — Other Ambulatory Visit: Payer: Self-pay | Admitting: Internal Medicine

## 2020-03-30 DIAGNOSIS — K219 Gastro-esophageal reflux disease without esophagitis: Secondary | ICD-10-CM

## 2020-03-30 DIAGNOSIS — E785 Hyperlipidemia, unspecified: Secondary | ICD-10-CM

## 2020-03-30 NOTE — Telephone Encounter (Signed)
Requested Prescriptions  Pending Prescriptions Disp Refills  . pantoprazole (PROTONIX) 40 MG tablet [Pharmacy Med Name: PANTOPRAZOLE 40MG  TABLETS] 90 tablet 1    Sig: TAKE 1 TABLET(40 MG) BY MOUTH DAILY     Gastroenterology: Proton Pump Inhibitors Passed - 03/30/2020  7:06 AM      Passed - Valid encounter within last 12 months    Recent Outpatient Visits          3 months ago Type 2 diabetes mellitus with stage 2 chronic kidney disease, unspecified whether long term insulin use [facility])   [place], [place], [place]   4 months ago Need for vaccination against Streptococcus pneumoniae   [place], Annie Main L, RPH-CPP   4 months ago 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, with long-term current use of insulin, macular edema presence unspecified, unspecified retinopathy severity ([place])   [place], Deborah B, MD   7 months ago Chest pain, unspecified type   [place], Levada Dy M, [place]   9 months ago 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   In 1 month Wert, Christena Deem, MD [facility]   In 1 month Wynetta Emery Dalbert Batman, MD [place]           . famotidine (PEPCID) 20 MG tablet [Pharmacy Med Name: FAMOTIDINE 20MG  TABLETS] 90 tablet     Sig: TAKE 1 TABLET(20 MG) BY MOUTH AT BEDTIME     Gastroenterology:  H2 Antagonists Passed - 03/30/2020  7:06 AM      Passed - Valid encounter within last 12 months    Recent Outpatient Visits          3 months ago Type 2 diabetes  mellitus with stage 2 chronic kidney disease, unspecified whether long term insulin use [facility])   [place], [place], [place]   4 months ago Need for vaccination against Streptococcus pneumoniae   [place], Annie Main L, RPH-CPP   4 months ago 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, with long-term current use of insulin, macular edema presence unspecified, unspecified retinopathy severity ([place])   [place], Deborah B, MD   7 months ago Chest pain, unspecified type   [place], Levada Dy M, [place]   9 months ago 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   In 1 month Wert, Christena Deem, MD [facility]   In 1 month Wynetta Emery Dalbert Batman, MD [place]

## 2020-04-04 ENCOUNTER — Ambulatory Visit: Payer: Medicare HMO | Admitting: Cardiology

## 2020-04-09 ENCOUNTER — Other Ambulatory Visit: Payer: Self-pay | Admitting: Internal Medicine

## 2020-04-09 MED ORDER — FUROSEMIDE 40 MG PO TABS
40.0000 mg | ORAL_TABLET | Freq: Every day | ORAL | 1 refills | Status: DC
Start: 2020-04-09 — End: 2020-05-09

## 2020-04-09 NOTE — Telephone Encounter (Signed)
I have not received a fax. Will forward information to Dr. Durenda Age CMA to see if she has received anything.

## 2020-04-09 NOTE — Telephone Encounter (Signed)
I have not received anything will reach out to [facility] tomorrow

## 2020-04-09 NOTE — Telephone Encounter (Signed)
This rx was written by a hospitalist. Will forward to PCP for approval.

## 2020-04-09 NOTE — Telephone Encounter (Signed)
Copied from [place] [id number]. Topic: Quick Communication - Rx Refill/Question >> Apr 09, 2020 11:26 AM Leward Quan A wrote: Medication: furosemide (LASIX) 40 MG tablet   Has the patient contacted their pharmacy? Yes.   (Agent: If no, request that the patient contact the pharmacy for the refill.) (Agent: If yes, when and what did the pharmacy advise?)  Preferred Pharmacy (with phone number or street name): [facility] [place], [place]  Phone:  [phone] Fax:  [phone]     Agent: Please be advised that RX refills may take up to 3 business days. We ask that you follow-up with your pharmacy.

## 2020-04-09 NOTE — Telephone Encounter (Signed)
Patient called to say that [facility] was trying to reach out to Dr Wynetta Emery in regarding her diabetic testing supplies. Per patient there was a fax sent over from [facility] in regards to the Glucometer that is covered and she states that she is not able to check her blood sugar due to not having the supplies needed. Please advise Ph# [phone]

## 2020-04-09 NOTE — Telephone Encounter (Signed)
Requested medication (s) are due for refill today:  Yes  Requested medication (s) are on the active medication list:  Yes  Future visit scheduled:  Yes  Last Refill: 12/04/19; #60/ no refills  Notes to clinic: last filled by Hospitalist; please advise.  Requested Prescriptions  Pending Prescriptions Disp Refills   furosemide (LASIX) 40 MG tablet 60 tablet 0    Sig: Take 40mg  twice daily for 5 days and then take 40mg  once daily.      Cardiovascular:  Diuretics - Loop Passed - 04/09/2020 11:30 AM      Passed - K in normal range and within 360 days    Potassium  Date Value Ref Range Status  12/04/2019 4.3 3.5 - 5.1 mmol/L Final          Passed - Ca in normal range and within 360 days    Calcium  Date Value Ref Range Status  12/04/2019 9.3 8.9 - 10.3 mg/dL Final   Calcium, Ion  Date Value Ref Range Status  01/27/2018 1.13 (L) 1.15 - 1.40 mmol/L Final          Passed - Na in normal range and within 360 days    Sodium  Date Value Ref Range Status  12/04/2019 139 135 - 145 mmol/L Final  11/13/2019 146 (H) 134 - 144 mmol/L Final          Passed - Cr in normal range and within 360 days    Creat  Date Value Ref Range Status  05/12/2016 1.14 (H) 0.50 - 0.99 mg/dL Final    Comment:      For patients > or = 68 years of age: The upper reference limit for Creatinine is approximately 13% higher for people identified as African-American.      Creatinine, Ser  Date Value Ref Range Status  12/04/2019 0.92 0.44 - 1.00 mg/dL Final   Creatinine, Urine  Date Value Ref Range Status  11/27/2019 146.00 mg/dL Final    Comment:    Performed at [facility], [place] [address], [place], [place] [address]          Passed - Last BP in normal range    BP Readings from Last 1 Encounters:  02/28/20 120/70          Passed - Valid encounter within last 6 months    Recent Outpatient Visits           3 months ago Type 2 diabetes mellitus with stage 2 chronic kidney disease,  unspecified whether long term insulin use [facility])   [place], [place], [place]   4 months ago Need for vaccination against Streptococcus pneumoniae   [place], Annie Main L, RPH-CPP   4 months ago 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   In 3 weeks Wert, Christena Deem, MD [facility]   In 1 month Wynetta Emery Dalbert Batman, MD [place]

## 2020-04-10 ENCOUNTER — Other Ambulatory Visit: Payer: Self-pay | Admitting: Pharmacist

## 2020-04-10 ENCOUNTER — Other Ambulatory Visit: Payer: Medicare HMO

## 2020-04-10 MED ORDER — ACCU-CHEK GUIDE ME W/DEVICE KIT: PACK | 0 refills | Status: DC

## 2020-04-10 MED ORDER — ACCU-CHEK GUIDE VI STRP: ORAL_STRIP | 6 refills | Status: DC

## 2020-04-10 MED ORDER — ACCU-CHEK FASTCLIX LANCETS MISC: 6 refills | Status: DC

## 2020-04-10 NOTE — Telephone Encounter (Signed)
Heidi [name] could you please send in dm supplies to [employer]

## 2020-04-10 NOTE — Telephone Encounter (Signed)
Rx sent 

## 2020-04-24 DIAGNOSIS — I509 Heart failure, unspecified: Secondary | ICD-10-CM | POA: Diagnosis not present

## 2020-04-24 DIAGNOSIS — R079 Chest pain, unspecified: Secondary | ICD-10-CM | POA: Diagnosis not present

## 2020-04-25 ENCOUNTER — Ambulatory Visit: Payer: Medicare HMO | Admitting: Cardiology

## 2020-04-30 ENCOUNTER — Ambulatory Visit: Payer: Medicare HMO | Admitting: Neurology

## 2020-04-30 DIAGNOSIS — G43709 Chronic migraine without aura, not intractable, without status migrainosus: Secondary | ICD-10-CM

## 2020-04-30 NOTE — Progress Notes (Signed)
Interval history 04/30/2020: Patient with chronic intractable migraines. Still doing exceptionally well on Botox for Migraines, only botox and topiramate have ever helped. Prior tried multiple classes of medications which did not help with headaches or migraines until she started Botox. Since starting botox she has had  >95% improvement in frequency and severity, had to redo one botox and she did well again. The migraines she does have are sparse, and only recur when the botox is wearing off the last 2 weeks but still are easier to treat and >60% less severe.Nothing has worked in the past except botox and Topiramate.  No masseters or any other additional injections  Consent Form Botulism Toxin Injection For Chronic Migraine    Reviewed orally with patient, additionally signature is on file:  Botulism toxin has been approved by the Federal drug administration for treatment of chronic migraine. Botulism toxin does not cure chronic migraine and it may not be effective in some patients.  The administration of botulism toxin is accomplished by injecting a small amount of toxin into the muscles of the neck and head. Dosage must be titrated for each individual. Any benefits resulting from botulism toxin tend to wear off after 3 months with a repeat injection required if benefit is to be maintained. Injections are usually done every 3-4 months with maximum effect peak achieved by about 2 or 3 weeks. Botulism toxin is expensive and you should be sure of what costs you will incur resulting from the injection.  The side effects of botulism toxin use for chronic migraine may include:   -Transient, and usually mild, facial weakness with facial injections  -Transient, and usually mild, head or neck weakness with head/neck injections  -Reduction or loss of forehead facial animation due to forehead muscle weakness  -Eyelid drooping  -Dry eye  -Pain at the site of injection or bruising at the site of  injection  -Double vision  -Potential unknown long term risks  Contraindications: You should not have Botox if you are pregnant, nursing, allergic to albumin, have an infection, skin condition, or muscle weakness at the site of the injection, or have myasthenia gravis, Lambert-Eaton syndrome, or ALS.  It is also possible that as with any injection, there may be an allergic reaction or no effect from the medication. Reduced effectiveness after repeated injections is sometimes seen and rarely infection at the injection site may occur. All care will be taken to prevent these side effects. If therapy is given over a long time, atrophy and wasting in the muscle injected may occur. Occasionally the patient's become refractory to treatment because they develop antibodies to the toxin. In this event, therapy needs to be modified.  I have read the above information and consent to the administration of botulism toxin.    BOTOX PROCEDURE NOTE FOR MIGRAINE HEADACHE    Contraindications and precautions discussed with patient(above). Aseptic procedure was observed and patient tolerated procedure. Procedure performed by Dr. Toni Tamari Busic  The condition has existed for more than 6 months, and pt does not have a diagnosis of ALS, Myasthenia Gravis or Lambert-Eaton Syndrome.  Risks and benefits of injections discussed and pt agrees to proceed with the procedure.  Written consent obtained  These injections are medically necessary. Pt  receives good benefits from these injections. These injections do not cause sedations or hallucinations which the oral therapies may cause.  Description of procedure:  The patient was placed in a sitting position. The standard protocol was used for Botox as follows, with   5 units of Botox injected at each site:   -Procerus muscle, midline injection  -Corrugator muscle, bilateral injection  -Frontalis muscle, bilateral injection, with 2 sites each side, medial injection was  performed in the upper one third of the frontalis muscle, in the region vertical from the medial inferior edge of the superior orbital rim. The lateral injection was again in the upper one third of the forehead vertically above the lateral limbus of the cornea, 1.5 cm lateral to the medial injection site.  -Temporalis muscle injection, 4 sites, bilaterally. The first injection was 3 cm above the tragus of the ear, second injection site was 1.5 cm to 3 cm up from the first injection site in line with the tragus of the ear. The third injection site was 1.5-3 cm forward between the first 2 injection sites. The fourth injection site was 1.5 cm posterior to the second injection site.   -Occipitalis muscle injection, 3 sites, bilaterally. The first injection was done one half way between the occipital protuberance and the tip of the mastoid process behind the ear. The second injection site was done lateral and superior to the first, 1 fingerbreadth from the first injection. The third injection site was 1 fingerbreadth superiorly and medially from the first injection site.  -Cervical paraspinal muscle injection, 2 sites, bilateral knee first injection site was 1 cm from the midline of the cervical spine, 3 cm inferior to the lower border of the occipital protuberance. The second injection site was 1.5 cm superiorly and laterally to the first injection site.  -Trapezius muscle injection was performed at 3 sites, bilaterally. The first injection site was in the upper trapezius muscle halfway between the inflection point of the neck, and the acromion. The second injection site was one half way between the acromion and the first injection site. The third injection was done between the first injection site and the inflection point of the neck.   Will return for repeat injection in 3 months.   200 units of Botox was used, any Botox not injected was wasted. The patient tolerated the procedure well, there were no  complications of the above procedure.

## 2020-04-30 NOTE — Progress Notes (Signed)
Botox- 100 units x 2 vials Lot: [id number] Expiration: 12/2022 NDC: 1740-9927-80  Bacteriostatic 0.9% Sodium Chloride- 33mL total Lot: [id number] Expiration: 08/06/2021 NDC: 8063-8685-48  Dx: S30.141 B/B

## 2020-05-01 ENCOUNTER — Ambulatory Visit
Admission: RE | Admit: 2020-05-01 | Discharge: 2020-05-01 | Disposition: A | Discharge status: Home / Self Care | Payer: Medicare HMO | Source: Ambulatory Visit | Attending: Physician Assistant | Admitting: Physician Assistant

## 2020-05-01 ENCOUNTER — Other Ambulatory Visit: Payer: Self-pay

## 2020-05-01 ENCOUNTER — Ambulatory Visit: Payer: Medicare HMO

## 2020-05-01 DIAGNOSIS — R928 Other abnormal and inconclusive findings on diagnostic imaging of breast: Secondary | ICD-10-CM | POA: Diagnosis not present

## 2020-05-01 DIAGNOSIS — N63 Unspecified lump in unspecified breast: Secondary | ICD-10-CM

## 2020-05-02 ENCOUNTER — Telehealth: Payer: Self-pay | Admitting: Neurology

## 2020-05-02 NOTE — Telephone Encounter (Signed)
Noted, patient has been rescheduled.  °

## 2020-05-02 NOTE — Telephone Encounter (Signed)
Patient had a Botox appointment yesterday. Patient has a large balance from Botox injections with [facility], and can not be scheduled for next injection until she pays her balance in full per billing. I called the patient to advise her of this, but she did not answer and no VM was available. FYI

## 2020-05-02 NOTE — Telephone Encounter (Signed)
I spoke with Angie about this and I will take care of it. You can schedule her. thanks

## 2020-05-03 ENCOUNTER — Ambulatory Visit: Payer: Medicare HMO | Admitting: Internal Medicine

## 2020-05-03 ENCOUNTER — Other Ambulatory Visit: Payer: Self-pay

## 2020-05-03 ENCOUNTER — Encounter: Payer: Self-pay | Admitting: Internal Medicine

## 2020-05-03 ENCOUNTER — Other Ambulatory Visit: Payer: Self-pay | Admitting: Internal Medicine

## 2020-05-03 DIAGNOSIS — J45991 Cough variant asthma: Secondary | ICD-10-CM | POA: Diagnosis not present

## 2020-05-03 DIAGNOSIS — Z9981 Dependence on supplemental oxygen: Secondary | ICD-10-CM | POA: Diagnosis not present

## 2020-05-03 DIAGNOSIS — J9611 Chronic respiratory failure with hypoxia: Secondary | ICD-10-CM

## 2020-05-03 DIAGNOSIS — K219 Gastro-esophageal reflux disease without esophagitis: Secondary | ICD-10-CM

## 2020-05-03 MED ORDER — ALBUTEROL SULFATE HFA 108 (90 BASE) MCG/ACT IN AERS
1.0000 | INHALATION_SPRAY | RESPIRATORY_TRACT | 1 refills | Status: DC | PRN
Start: 2020-05-03 — End: 2020-08-01

## 2020-05-03 NOTE — Assessment & Plan Note (Addendum)
Body mass index is 42.48 kg/m.  -  trending up  Lab Results  Component Value Date   TSH 0.750 11/24/2019     Contributing to gerd risk/ doe/reviewed the need and the process to achieve and maintain neg calorie balance > defer f/u primary care including intermittently monitoring thyroid status            Each maintenance medication was reviewed in detail including emphasizing most importantly the difference between maintenance and prns and under what circumstances the prns are to be triggered using an action plan format where appropriate.  Total time for H and P, chart review, counseling, directly observing portions of ambulatory 02 saturation study/  and generating customized AVS unique to this office visit / charting = 39 min

## 2020-05-03 NOTE — Assessment & Plan Note (Signed)
Never smoker - pfts 12/12/13 done while sympt on ACEi showed no obst or flattening of f/v curve - Spirometry 09/09/2017  FEV1 0.87 (44%)  Ratio 78 with min curvature w/in 2 h of study with symptoms at time of study  - FENO 09/09/2017  =   8  - trial off acei 09/09/2017   - PFT's  10/27/2017  FEV1 1.07 (54 % ) ratio 86  p no % improvement from saba p nothing  prior to study with DLCO  40 % corrects to 116  % for alv volume  - 05/03/2020 reported no worse off symbicort so rec leave off and just use saba prn     No evidence to support active asthma:  No worse off symbicort nor any need for more saba than baseline  Re saba: I spent extra time with pt today reviewing appropriate use of albuterol for prn use on exertion with the following points: 1) saba is for relief of sob that does not improve by walking a slower pace or resting but rather if the pt does not improve after trying this first. 2) If the pt is convinced, as many are, that saba helps recover from activity faster then it's easy to tell if this is the case by re-challenging : ie stop, take the inhaler, then p 5 minutes try the exact same activity (intensity of workload) that just caused the symptoms and see if they are substantially diminished or not after saba 3) if there is an activity that reproducibly causes the symptoms, try the saba 15 min before the activity on alternate days   If in fact the saba really does help, then fine to continue to use it prn but advised may need to look closer at the maintenance regimen (which right now = zero) being used to achieve better control of airways disease with exertion.

## 2020-05-03 NOTE — Assessment & Plan Note (Addendum)
New start 02/2020  2lpm hs and with activity - 05/03/2020 Pt was able to complete 3 laps at slow pace with 1 stop. On end of 1st lap pt's O2 sat dropped to 88% on RA , pt recovered to 53% with application of 2L cont O2 and completed 2 more laps at 250 ft each on this setting s desats at very slow pace  As of 05/03/2020  2lpm hs and with walking > room to room at home use 2lpm but ok to adjust up if needed to keep sats > 90%

## 2020-05-03 NOTE — Patient Instructions (Addendum)
Make sure you check your oxygen saturations at highest level of activity to be sure it stays over 90% and adjust  02 flow upward to maintain this level if needed but remember to turn it back to previous settings when you stop (to conserve your supply).    Only use your albuterol (ventolin) as a rescue medication to be used if you can't catch your breath by resting or doing a relaxed purse lip breathing pattern.  - The less you use it, the better it will work when you need it. - Ok to use up to 2 puffs  every 4 hours if you must but call for immediate appointment if use goes up over your usual need - Don't leave home without it !!  (think of it like the spare tire for your car)   Also ok to Try albuterol (ventolin)15 min before an activity that you know would make you short of breath and see if it makes any difference and if makes none then don't take it after activity unless you can't catch your breath.      Please schedule a follow up visit in 6  months but call sooner if needed

## 2020-05-03 NOTE — Progress Notes (Addendum)
Subjective:    Patient ID: Heidi [name], female   DOB: February 19, 1951      MRN: [id number]    Brief patient profile:  89 yobf never smoker healthy child/ adolescent with last IUP age 69  around 73  with baseline wt around 170 good activity tol but no aerobics then gradual wt gain assoc with hbp and placed on ACEi at some point not clear when but thinks was likely prior  to onset sob and freq "colds"  2015  >  eval by [place] pulmonary doctor dx asthma > symbicort / albuterol not helpful  So referred to pulmonary clinic 09/09/2017 by Dr   Karle Plumber.    History of Present Illness  09/09/2017 1st Heidi [name] Pulmonary office visit/ Heidi [name]  On acei since at least 2015 pulmonary ov per records  Chief Complaint  Patient presents with  . Pulmonary Consult    Referred by Dr. Wynetta Emery. Pt states dxed with PNA 08/14/17. She c/o PND and cough is non prod She is using her albuterol inhaler 3 x daily and rarely uses her neb.   Sleeps ok on 2 pillows but can't walk 50 ft s sob = baseline x years assoc with dry hacking cough/ min improvement on saba which she used w/in 2 h of ov "just couldn't get to the office without is (note very poor hfa)  - neb doesn't work much better  rec Stop lisinopril and start losartan 50 mg one daily  your breathing problems should gradually resolve over the next few weeks Plan B = Backup Only use your albuterol as a rescue medication  Plan C = Crisis - only use your albuterol nebulizer if you first try Plan B  GERD diet Pantoprazole (protonix) 40 mg(or Prilosec 40 mg)   Take  30-60 min before first meal of the day and zantac (Ranitidine)  150  mg one hour before  bedtime until return to office - this is the best way to tell whether stomach acid is contributing to your problem.          10/27/2017  f/u ov/Heidi [name] re:  uacs / obesity  Chief Complaint  Patient presents with  . Follow-up    Cough has improved some, but has not resolved. She now relates the cough to allergies. She is  coughing up minimal green to yellow sputum.  She is using her rescue inhaler 1 x per wk on average and has only used her neb x 1 since her last visit.   Dyspnea:  Resolved to her satisfaction Cough: better until about    more noct than daytime / no am flare  Sleep: fine unless has "cold" horizontal  SABA use:  As above, not sure it helps  problems with walking using R crutch or coordination,  change in mood or  Memory rec Try zyrtec one hour before bed as needed for cough / drainage/ itching /sneezing Allergy profile > did not go     NP eval for sob 02/28/2020  rec 1. Chronic asthma without complication Continue Symbicort 80 Only use your albuterol as a rescue medication 2. Exertional dyspnea  We also need to have close follow-up with our specialist here both Dr. Melvyn Novas as well as a sleep MD. 3. Chronic respiratory failure with hypoxia, on home oxygen therapy ([place]) Walk today in office he required 1 L with physical exertion Continue oxygen therapy as prescribed  >>>maintain oxygen saturations greater than 88 percent  >>>if unable to maintain oxygen saturations please contact the  office  >>>do not smoke with oxygen  >>>can use nasal saline gel or nasal saline rinses to moisturize nose if oxygen causes dryness SLEEP CONSULT - 41mn slot  5. Diastolic congestive heart failure, unspecified HF chronicity ([facility] [place] Keep follow-up with cardiology Continue fluid pills as managed by cardiology 6. Obesity hypoventilation syndrome (HCC) /Ozzie Hoyleof obstructive sleep apnea Patient needs to schedule SLEEP CONSULT - 30 min slot   05/03/2020  f/u ov/Heidi [name] re:  Off symbicort x 2 months  Chief Complaint  Patient presents with  . Follow-up    Cough improved/ SOB without O2  Dyspnea:  50 ft to get on scooter at [employer]  Can do [employer] leaning on cart and 2lpm/ no crutch   Cough: none  Sleeping: bed is flat/ wedge pillow  SABA use: rarely albuterol no more so than when had symptom  02: 2lpm at hs,    None sitting still,  Adjust it up to 2lpm but lost oximeter    No obvious day to day or daytime variability or assoc excess/ purulent sputum or mucus plugs or hemoptysis or cp or chest tightness, subjective wheeze or overt sinus or hb symptoms.   Sleeping as above without nocturnal  or early am exacerbation  of respiratory  c/o's or need for noct saba. Also denies any obvious fluctuation of symptoms with weather or environmental changes or other aggravating or alleviating factors except as outlined above   No unusual exposure hx or h/o childhood pna/ asthma or knowledge of premature birth.  Current Allergies, Complete Past Medical History, Past Surgical History, Family History, and Social History were reviewed in [employer].  ROS  The following are not active complaints unless bolded Hoarseness, sore throat, dysphagia, dental problems, itching, sneezing,  nasal congestion or discharge of excess mucus or purulent secretions, ear ache,   fever, chills, sweats, unintended wt loss or wt gain, classically pleuritic or exertional cp,  orthopnea pnd or arm/hand swelling  or leg swelling, presyncope, palpitations, abdominal pain, anorexia, nausea, vomiting, diarrhea  or change in bowel habits or change in bladder habits, change in stools or change in urine, dysuria, hematuria,  rash, arthralgias, visual complaints, headache, numbness, weakness or ataxia or problems with walking or coordination,  change in mood or  memory.        Current Meds  Medication Sig  . Accu-Chek FastClix Lancets MISC Use as directed to test blood sugar three times daily DX E11.65  . albuterol (PROVENTIL) (2.5 MG/3ML) 0.083% nebulizer solution USE 1 VIAL VIA NEBULIZER EVERY 6 HOURS AS NEEDED FOR WHEEZING OR SHORTNESS OF BREATH  . albuterol (VENTOLIN HFA) 108 (90 Base) MCG/ACT inhaler Inhale 1-2 puffs into the lungs every 6 (six) hours as needed for wheezing or shortness of breath.  .Marland Kitchenaspirin 81 MG  chewable tablet Chew 81 mg by mouth daily.   .Marland Kitchenatenolol (TENORMIN) 25 MG tablet Take 1 tablet (25 mg total) by mouth daily.  .Marland Kitchenatorvastatin (LIPITOR) 40 MG tablet TAKE 1 TABLET BY MOUTH DAILY AT 6 PM  . Blood Glucose Monitoring Suppl (ACCU-CHEK GUIDE ME) w/Device KIT Use as directed to test blood sugar three times daily DX E11.65  . Fexofenadine HCl (MUCINEX ALLERGY PO) Take 1 tablet by mouth daily as needed (for allergy).  . furosemide (LASIX) 40 MG tablet Take 1 tablet (40 mg total) by mouth daily. Take 471mtwice daily for 5 days and then take 401mnce daily.  . gMarland Kitchenucose blood (ACCU-CHEK GUIDE) test strip  Use as directed to test blood sugar three times daily DX E11.65  . insulin aspart protamine- aspart (NOVOLOG MIX 70/30) (70-30) 100 UNIT/ML injection Inject 0.45-0.7 mLs (45-70 Units total) into the skin See admin instructions. Inject 70 units in the morning and 45 units in the evening.  . INSULIN SYRINGE 1CC/29G 29G X 1/2" 1 ML MISC USE THREE TIMES DAILY AS DIRECTED  . loratadine (CLARITIN) 10 MG tablet Take 10 mg by mouth daily as needed for allergies.  Marland Kitchen losartan (COZAAR) 50 MG tablet Take 1 tablet (50 mg total) by mouth daily.  . montelukast (SINGULAIR) 10 MG tablet Take 1 tablet (10 mg total) by mouth at bedtime.  . Multiple Vitamin (MULTIVITAMIN WITH MINERALS) TABS tablet Take 1 tablet by mouth daily.  . nitroGLYCERIN (NITROSTAT) 0.3 MG SL tablet Place 1 tablet (0.3 mg total) under the tongue every 5 (five) minutes as needed for chest pain. Max 3 tablets in 15 minutes  . olopatadine (PATANOL) 0.1 % ophthalmic solution Place 1 drop into both eyes 2 (two) times daily.  . pantoprazole (PROTONIX) 40 MG tablet TAKE 1 TABLET(40 MG) BY MOUTH DAILY  . Potassium Chloride ER 20 MEQ TBCR TAKE 1 TABLET BY MOUTH DAILY  . tetrahydrozoline-zinc (VISINE-AC) 0.05-0.25 % ophthalmic solution Place 1 drop into both eyes 3 (three) times daily as needed.  . topiramate (TOPAMAX) 25 MG capsule Take 3 capsules  (75 mg total) by mouth 2 (two) times daily.                      Objective:   Physical Exam     05/03/2020     239 10/27/2017       227   09/09/17 228 lb (103.4 kg)  09/06/17 227 lb 3.2 oz (103.1 kg)  08/23/17 227 lb 12.8 oz (103.3 kg)     Vital signs reviewed  05/03/2020  - Note at rest 02 sats  94% on RA      HEENT : pt wearing mask not removed for exam due to covid -19 concerns.    NECK :  without JVD/Nodes/TM/ nl carotid upstrokes bilaterally   LUNGS: no acc muscle use,  Nl contour chest which is clear to A and P bilaterally without cough on insp or exp maneuvers   CV:  RRR  no s3 or murmur or increase in P2, and trace bilateral LE Edema/ sym  ABD:  Obese soft and nontender with nl inspiratory excursion in the supine position. No bruits or organomegaly appreciated, bowel sounds nl  MS:  Nl gait/ ext warm without deformities, calf tenderness, cyanosis or clubbing No obvious joint restrictions   SKIN: warm and dry without lesions    NEURO:  alert, approp, nl sensorium with  no motor or cerebellar deficits apparent.    up   I personally reviewed images and agree with radiology impression as follows:  CXR:   02/28/20 pa and lat Cardiomegaly and mild, diffuse bilateral interstitial pulmonary opacity, similar to prior examination, likely edema. No focal airspace opacity.     Assessment:

## 2020-05-06 ENCOUNTER — Institutional Professional Consult (permissible substitution): Payer: Medicare HMO | Admitting: Pulmonary Disease

## 2020-05-08 ENCOUNTER — Other Ambulatory Visit: Payer: Self-pay | Admitting: Internal Medicine

## 2020-05-08 ENCOUNTER — Telehealth: Payer: Self-pay | Admitting: Internal Medicine

## 2020-05-08 NOTE — Telephone Encounter (Signed)
Requested medication (s) are due for refill today: yes  Requested medication (s) are on the active medication list: yes  Last refill:  11/13/19#180 with 3 refills  Future visit scheduled: yes  Notes to clinic:  Please review for refill. Patient requesting 90 day supply (#540). Refill not delegated per protocol    Requested Prescriptions  Pending Prescriptions Disp Refills   topiramate (TOPAMAX) 25 MG capsule [Pharmacy Med Name: TOPIRAMATE 25MG  SPRINKLE CAPSULES] 540 capsule     Sig: TAKE 3 CAPSULES(75 MG) BY MOUTH TWICE DAILY      Not Delegated - Neurology: Anticonvulsants - topiramate & zonisamide Failed - 05/08/2020  3:34 PM      Failed - This refill cannot be delegated      Passed - Cr in normal range and within 360 days    Creat  Date Value Ref Range Status  05/12/2016 1.14 (H) 0.50 - 0.99 mg/dL Final    Comment:      For patients > or = 68 years of age: The upper reference limit for Creatinine is approximately 13% higher for people identified as African-American.      Creatinine, Ser  Date Value Ref Range Status  12/04/2019 0.92 0.44 - 1.00 mg/dL Final   Creatinine, Urine  Date Value Ref Range Status  11/27/2019 146.00 mg/dL Final    Comment:    Performed at [facility], [place] [address], [place], [place] [address]          Passed - CO2 in normal range and within 360 days    CO2  Date Value Ref Range Status  12/04/2019 29 22 - 32 mmol/L Final   Bicarbonate  Date Value Ref Range Status  11/25/2014 24.4 (H) 20.0 - 24.0 mEq/L Final          Passed - Valid encounter within last 12 months    Recent Outpatient Visits           4 months ago Type 2 diabetes mellitus with stage 2 chronic kidney disease, unspecified whether long term insulin use [facility])   [place], [place], [place]   5 months ago Need for vaccination against Streptococcus pneumoniae   [place], Annie Main L,  RPH-CPP   5 months ago 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, with long-term current use of insulin, macular edema presence unspecified, unspecified retinopathy severity ([place])   [place], MD   8 months ago Chest pain, unspecified type   [place], Levada Dy M, [place]   11 months ago 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, with long-term current use of insulin, macular edema presence unspecified, unspecified retinopathy severity [facility])   [place], MD       Future Appointments             In 6 days Ladell Pier, MD [place]   In 5 months Melvyn Novas, Christena Deem, MD [facility]

## 2020-05-09 ENCOUNTER — Other Ambulatory Visit: Payer: Self-pay | Admitting: Internal Medicine

## 2020-05-12 ENCOUNTER — Other Ambulatory Visit: Payer: Self-pay | Admitting: Internal Medicine

## 2020-05-14 ENCOUNTER — Other Ambulatory Visit: Payer: Self-pay | Admitting: Internal Medicine

## 2020-05-14 ENCOUNTER — Other Ambulatory Visit: Payer: Self-pay

## 2020-05-14 ENCOUNTER — Encounter: Payer: Self-pay | Admitting: Internal Medicine

## 2020-05-14 ENCOUNTER — Ambulatory Visit: Payer: Medicare HMO | Attending: Internal Medicine | Admitting: Internal Medicine

## 2020-05-14 VITALS — BP 137/77 | HR 81 | Resp 16 | Wt 242.2 lb

## 2020-05-14 DIAGNOSIS — I5032 Chronic diastolic (congestive) heart failure: Secondary | ICD-10-CM

## 2020-05-14 DIAGNOSIS — N63 Unspecified lump in unspecified breast: Secondary | ICD-10-CM | POA: Diagnosis not present

## 2020-05-14 DIAGNOSIS — E785 Hyperlipidemia, unspecified: Secondary | ICD-10-CM

## 2020-05-14 DIAGNOSIS — N182 Chronic kidney disease, stage 2 (mild): Secondary | ICD-10-CM

## 2020-05-14 DIAGNOSIS — E1122 Type 2 diabetes mellitus with diabetic chronic kidney disease: Secondary | ICD-10-CM

## 2020-05-14 DIAGNOSIS — Z23 Encounter for immunization: Secondary | ICD-10-CM

## 2020-05-14 DIAGNOSIS — Z9981 Dependence on supplemental oxygen: Secondary | ICD-10-CM

## 2020-05-14 DIAGNOSIS — M4302 Spondylolysis, cervical region: Secondary | ICD-10-CM

## 2020-05-14 DIAGNOSIS — J9611 Chronic respiratory failure with hypoxia: Secondary | ICD-10-CM

## 2020-05-14 DIAGNOSIS — E1169 Type 2 diabetes mellitus with other specified complication: Secondary | ICD-10-CM | POA: Diagnosis not present

## 2020-05-14 DIAGNOSIS — K219 Gastro-esophageal reflux disease without esophagitis: Secondary | ICD-10-CM

## 2020-05-14 LAB — POCT GLYCOSYLATED HEMOGLOBIN (HGB A1C): HbA1c, POC (controlled diabetic range): 9.9 % — AB (ref 0.0–7.0)

## 2020-05-14 LAB — GLUCOSE, POCT (MANUAL RESULT ENTRY): POC Glucose: 159 mg/dl — AB (ref 70–99)

## 2020-05-14 MED ORDER — DAPAGLIFLOZIN PROPANEDIOL 5 MG PO TABS: 5.0000 mg | ORAL_TABLET | Freq: Every day | ORAL | 5 refills | Status: DC

## 2020-05-14 MED ORDER — FUROSEMIDE 40 MG PO TABS: 40.0000 mg | ORAL_TABLET | Freq: Every day | ORAL | 6 refills | Status: DC

## 2020-05-14 MED ORDER — TRAMADOL HCL 50 MG PO TABS: 50.0000 mg | ORAL_TABLET | Freq: Three times a day (TID) | ORAL | 0 refills | Status: DC | PRN

## 2020-05-14 NOTE — Progress Notes (Addendum)
Patient ID: Heidi [name], female    DOB: 10/10/1950  MRN: [id number]  CC: Diabetes, Hypertension, and Medication Refill   Subjective: Heidi [name] is a 69 y.o. female who presents for chronic ds management Her concerns today include:  history of HTN,DM 2 with retinopathy, HL, CKD 3,diastolicCHF, migraines([facility] on Botox inj),OSA (noton BiPAP),UACS,GERD, OA knees,DDD [facility] ([facility])obesity, renal cyst, LT thyroid nodule.  Hypoxic Resp Failure/Cough Variant Asthma:  Saw Dr. Melvyn Novas about 2 wks ago.  Walk test showed that she still requires oxygen with ambulation.  X-ray done 02/2020 revealed cardiomegaly and mild diffuse bilateral interstitial pulmonary opacities LE edema.  DIABETES TYPE 2 Last A1C:   Results for orders placed or performed in visit on 05/14/20  POCT glucose (manual entry)  Result Value Ref Range   POC Glucose 159 (A) 70 - 99 mg/dl  POCT glycosylated hemoglobin (Hb A1C)  Result Value Ref Range   Hemoglobin A1C     HbA1c POC (<> result, manual entry)     HbA1c, POC (prediabetic range)     HbA1c, POC (controlled diabetic range) 9.9 (A) 0.0 - 7.0 %    Med Adherence:  [x]  Yes  But was out of insulin for 2 wks.  Too expensive. c/o pay  went from $40 to 200.00.   Niece died and was able to get her unused insulin.  Reports taking 80 units a.m and 40 units p.m of the Novolog 70/30.  Has changed insurance but will not take effect until 1st of the yr.  Medication side effects:  []  Yes    [x]  No Home Monitoring?  [x]  Yes  TID Home glucose results range:  Did not bring log.  Gives range of 100-120 in mornings, bedtime range 90-150 Diet Adherence: doing better; working on eating less Exercise: []  Yes    [x]  No Hypoglycemic episodes?: []  Yes    [x]  No Numbness of the feet? []  Yes    [x]  No Retinopathy hx? []  Yes    []  No Last eye exam:  Comments:    CHF/HTN: She has been out of furosemide for 4 days.  She has had some  lower extremity edema since being out of furosemide.  No increased shortness of breath.  She uses her oxygen with ambulation.  No PND.  She sleeps on one pillow.  She limits salt in the food.  She does weigh herself at home. Reports no significant weight changes.  Had mammogram the end of last month.  Report stated that she had multiple waxing and waning rounded circumscribed masses in both breasts with no intervals findings suspicious for malignancy.  Advised for screening again in 1 year.  Patient states that she checks her breasts.  She does not feel any palpable masses in the breast.  Family history of breast cancer in her sister who was diagnosed in her 35s.  Not sure whether her sister was tested for the breast cancer gene.  Patient herself has never had any breast biopsies.  Complains of increased pain in her neck.  States that she has been moving more and in and out of the hospital with her daughter.  She is requesting a refill on tramadol and would like referral to a pain specialist.  Last MRI of the neck done 2017 revealed multilevel degenerative spondylosis extending from C4-C7.  There was moderate bilateral foraminal stenosis.  HM:  Needs flu shot.  No COVID vaccine as yet.  Has appt with GI next  mth to get c-scope.  Patient Active Problem List   Diagnosis Date Noted  . Encounter for medication review and counseling 03/18/2020  . Medication management 02/28/2020  . Exertional dyspnea 01/03/2020  . Multifocal pneumonia 11/24/2019  . Acute respiratory failure ([place]) 11/24/2019  . Breast nodule 11/24/2019  . Chronic respiratory failure with hypoxia, on home oxygen therapy ([place]) 11/13/2019  . Atypical chest pain 08/24/2019  . Cervical myelopathy ([place]) 08/16/2018  . Diabetic retinopathy of right eye associated with type 2 diabetes mellitus ([place]) 08/16/2018  . Upper airway cough syndrome 05/03/2018  . Morbid obesity due to excess calories ([place]) complicated by hbp/ dm / hyperlipidemia  09/10/2017  . Cough variant asthma vs UACS/vcd on ACEi  09/09/2017  . Anxiety and depression 09/06/2017  . Mild persistent asthma without complication 86/57/8469  . Paranoia ([place]) 09/06/2017  . Torn left ear lobe 11/20/2016  . Chronic migraine w/o aura w/o status migrainosus, not intractable 06/27/2016  . Post-menopausal atrophic vaginitis 11/14/2015  . Primary osteoarthritis of both knees 07/18/2015  . GERD (gastroesophageal reflux disease) 05/08/2015  . Renal cyst 05/08/2015  . Adenomatous polyp of colon 03/26/2015  . Essential hypertension 02/12/2015  . Cognitive deficit due to old head trauma 02/12/2015  . Asthma, chronic 02/12/2015  . Chronic left shoulder pain 02/11/2015  . Thyroid nodule 12/18/2014  . Chronic neck pain   . HLD (hyperlipidemia)   . OSA treated with BiPAP 11/27/2014  . Obesity hypoventilation syndrome ([place]) 11/27/2014  . Demand ischemia ([place])   . Uncontrolled type 2 diabetes mellitus with hyperglycemia ([place])   . Cardiomegaly 11/25/2014  . CHF (congestive heart failure) ([place]) 11/25/2014  . DM type 2 causing CKD stage 2 ([place]) 11/25/2014     Current Outpatient Medications on File Prior to Visit  Medication Sig Dispense Refill  . Accu-Chek FastClix Lancets MISC Use as directed to test blood sugar three times daily DX E11.65 102 each 6  . albuterol (PROVENTIL) (2.5 MG/3ML) 0.083% nebulizer solution USE 1 VIAL VIA NEBULIZER EVERY 6 HOURS AS NEEDED FOR WHEEZING OR SHORTNESS OF BREATH 25 vial 0  . albuterol (VENTOLIN HFA) 108 (90 Base) MCG/ACT inhaler Inhale 1-2 puffs into the lungs every 4 (four) hours as needed for wheezing or shortness of breath. 8 g 1  . aspirin 81 MG chewable tablet Chew 81 mg by mouth daily.     Marland Kitchen atenolol (TENORMIN) 25 MG tablet Take 1 tablet (25 mg total) by mouth daily. 90 tablet 3  . atorvastatin (LIPITOR) 40 MG tablet TAKE 1 TABLET BY MOUTH DAILY AT 6 PM 90 tablet 0  . Blood Glucose Monitoring Suppl (ACCU-CHEK GUIDE ME) w/Device KIT Use as  directed to test blood sugar three times daily DX E11.65 1 kit 0  . Fexofenadine HCl (MUCINEX ALLERGY PO) Take 1 tablet by mouth daily as needed (for allergy).    Marland Kitchen glucose blood (ACCU-CHEK GUIDE) test strip Use as directed to test blood sugar three times daily DX E11.65 100 each 6  . insulin aspart protamine- aspart (NOVOLOG MIX 70/30) (70-30) 100 UNIT/ML injection Inject 0.45-0.7 mLs (45-70 Units total) into the skin See admin instructions. Inject 70 units in the morning and 45 units in the evening.    . INSULIN SYRINGE 1CC/29G 29G X 1/2" 1 ML MISC USE THREE TIMES DAILY AS DIRECTED 100 each 3  . loratadine (CLARITIN) 10 MG tablet Take 10 mg by mouth daily as needed for allergies.    Marland Kitchen losartan (COZAAR) 50 MG tablet Take 1  tablet (50 mg total) by mouth daily. 90 tablet 3  . montelukast (SINGULAIR) 10 MG tablet Take 1 tablet (10 mg total) by mouth at bedtime. 90 tablet 2  . Multiple Vitamin (MULTIVITAMIN WITH MINERALS) TABS tablet Take 1 tablet by mouth daily.    . nitroGLYCERIN (NITROSTAT) 0.3 MG SL tablet Place 1 tablet (0.3 mg total) under the tongue every 5 (five) minutes as needed for chest pain. Max 3 tablets in 15 minutes 30 tablet 1  . olopatadine (PATANOL) 0.1 % ophthalmic solution Place 1 drop into both eyes 2 (two) times daily. 5 mL 0  . pantoprazole (PROTONIX) 40 MG tablet TAKE 1 TABLET(40 MG) BY MOUTH DAILY 90 tablet 1  . Potassium Chloride ER 20 MEQ TBCR TAKE 1 TABLET BY MOUTH DAILY 90 tablet 3  . tetrahydrozoline-zinc (VISINE-AC) 0.05-0.25 % ophthalmic solution Place 1 drop into both eyes 3 (three) times daily as needed.    . topiramate (TOPAMAX) 25 MG capsule TAKE 3 CAPSULES(75 MG) BY MOUTH TWICE DAILY 270 capsule 0   No current facility-administered medications on file prior to visit.    Allergies  Allergen Reactions  . Januvia [Sitagliptin]     Chest pains    Social History   Socioeconomic History  . Marital status: Widowed    Spouse name: Not on file  . Number of  children: 4  . Years of education: Not on file  . Highest education level: Not on file  Occupational History  . Occupation: Disabled  Tobacco Use  . Smoking status: Never Smoker  . Smokeless tobacco: Never Used  Vaping Use  . Vaping Use: Never used  Substance and Sexual Activity  . Alcohol use: No  . Drug use: No  . Sexual activity: Not on file  Other Topics Concern  . Not on file  Social History Narrative   Lives with daughter   Caffeine use: 1 cup coffee per day   Social Determinants of Health   Financial Resource Strain:   . Difficulty of Paying Living Expenses: Not on file  Food Insecurity:   . Worried About Charity fundraiser in the Last Year: Not on file  . Ran Out of Food in the Last Year: Not on file  Transportation Needs:   . Lack of Transportation (Medical): Not on file  . Lack of Transportation (Non-Medical): Not on file  Physical Activity:   . Days of Exercise per Week: Not on file  . Minutes of Exercise per Session: Not on file  Stress:   . Feeling of Stress : Not on file  Social Connections:   . Frequency of Communication with Friends and Family: Not on file  . Frequency of Social Gatherings with Friends and Family: Not on file  . Attends Religious Services: Not on file  . Active Member of Clubs or Organizations: Not on file  . Attends Archivist Meetings: Not on file  . Marital Status: Not on file  Intimate Partner Violence:   . Fear of Current or Ex-Partner: Not on file  . Emotionally Abused: Not on file  . Physically Abused: Not on file  . Sexually Abused: Not on file    Family History  Problem Relation Age of Onset  . Colon cancer Father   . Diabetes type II Sister   . Diabetes type II Brother   . Colon cancer Sister   . Migraines Neg Hx   . Breast cancer Neg Hx     Past Surgical History:  Procedure Laterality Date  . ABDOMINAL HYSTERECTOMY    . CATARACT EXTRACTION Left   . CATARACT EXTRACTION     LT 01/2017, 02/2017 on RT  .  CESAREAN SECTION      ROS: Review of Systems Negative except as stated above  PHYSICAL EXAM: BP 137/77   Pulse 81   Resp 16   Wt 242 lb 3.2 oz (109.9 kg)   SpO2 90%   BMI 42.90 kg/m   Wt Readings from Last 3 Encounters:  05/14/20 242 lb 3.2 oz (109.9 kg)  05/03/20 239 lb 12.8 oz (108.8 kg)  02/28/20 242 lb 11.2 oz (110.1 kg)  current Pox taken on 2 Lt O2  Physical Exam  General appearance -patient in NAD.   Mental status -flat affect patient often has to be redirected Neck -}supple, no significant adenopathy Chest - clear to auscultation, no wheezes, rales or rhonchi, symmetric air entry Heart - normal rate, regular rhythm, normal S1, S2, no murmurs, rubs, clicks or gallops Extremities -1+ bilateral edema.   Breasts: Patient declines breast exam today.  CMP Latest Ref Rng & Units 12/04/2019 12/03/2019 12/02/2019  Glucose 70 - 99 mg/dL 144(H) 133(H) 227(H)  BUN 8 - 23 mg/dL 17 16 17   Creatinine 0.44 - 1.00 mg/dL 0.92 1.09(H) 1.10(H)  Sodium 135 - 145 mmol/L 139 138 138  Potassium 3.5 - 5.1 mmol/L 4.3 3.6 3.5  Chloride 98 - 111 mmol/L 101 98 96(L)  CO2 22 - 32 mmol/L 29 29 32  Calcium 8.9 - 10.3 mg/dL 9.3 9.3 9.2  Total Protein 6.0 - 8.5 g/dL - - -  Total Bilirubin 0.0 - 1.2 mg/dL - - -  Alkaline Phos 39 - 117 IU/L - - -  AST 0 - 40 IU/L - - -  ALT 0 - 32 IU/L - - -   Lipid Panel     Component Value Date/Time   CHOL 118 06/14/2019 1128   TRIG 148 06/14/2019 1128   HDL 56 06/14/2019 1128   CHOLHDL 2.1 06/14/2019 1128   CHOLHDL 2.1 08/11/2016 1109   VLDL 28 08/11/2016 1109   LDLCALC 37 06/14/2019 1128    CBC    Component Value Date/Time   WBC 11.2 (H) 11/28/2019 0316   RBC 4.80 11/28/2019 0316   HGB 13.1 11/28/2019 0316   HGB 13.2 08/23/2017 1655   HCT 43.0 11/28/2019 0316   HCT 38.8 08/23/2017 1655   PLT 229 11/28/2019 0316   MCV 89.6 11/28/2019 0316   MCV 83 08/23/2017 1655   MCH 27.3 11/28/2019 0316   MCHC 30.5 11/28/2019 0316   RDW 14.9 11/28/2019  0316   RDW 15.0 08/23/2017 1655   LYMPHSABS 2.9 11/28/2019 0316   LYMPHSABS 4.3 (H) 08/23/2017 1655   MONOABS 0.9 11/28/2019 0316   EOSABS 0.1 11/28/2019 0316   EOSABS 0.4 08/23/2017 1655   BASOSABS 0.0 11/28/2019 0316   BASOSABS 0.1 08/23/2017 1655    ASSESSMENT AND PLAN: 1. Type 2 diabetes mellitus with stage 2 chronic kidney disease, unspecified whether long term insulin use (HCC) Not at goal.  We discussed changing her to Novolin 70/30 insulin when she runs out of the NovoLog 70/30.  Patient declined stating that she will be able to stay on the NovoLog 70/30 with her new insurance.  We have added [place]. Declines referral to endocrine due to co-pay Discussed and encourage healthy eating habits. - POCT glucose (manual entry) - POCT glycosylated hemoglobin (Hb A1C) - Microalbumin / creatinine urine ratio - dapagliflozin  propanediol (FARXIGA) 5 MG TABS tablet; Take 1 tablet (5 mg total) by mouth daily before breakfast.  Dispense: 30 tablet; Refill: 5 - CBC - Comprehensive metabolic panel - Lipid panel  2. Chronic diastolic congestive heart failure ([place]) She is a little fluid overloaded today due to being out of furosemide for 4 days.  Refills given on furosemide.  Low-salt diet encouraged. - furosemide (LASIX) 40 MG tablet; Take 1 tablet (40 mg total) by mouth daily.  Dispense: 30 tablet; Refill: 6  3. Morbid obesity due to excess calories ([place]) See #1 above  4. Chronic respiratory failure with hypoxia, on home oxygen therapy (HCC) Due to CHF and pulmonary disorder.  Question need for high-resolution CT of the chest if pulmonary opacities seen on last chest x-ray is not resolved with diuresis  5. Hyperlipidemia associated with type 2 diabetes mellitus ([place]) Continue statin therapy  6. Breast nodule Recent tomo MMG done. Nodules appear benign per radiology report.  Patient had slightly higher risk given she has a first-degree relative with breast cancer.  However she has kept  up with her mammograms.  Plan for repeat in 1 yr  7. Need for influenza vaccination given  8. Cervical spondylolysis NCCSR system reviewed - traMADol (ULTRAM) 50 MG tablet; Take 1 tablet (50 mg total) by mouth every 8 (eight) hours as needed.  Dispense: 40 tablet; Refill: 0 - Ambulatory referral to Pain Clinic     Patient was given the opportunity to ask questions.  Patient verbalized understanding of the plan and was able to repeat key elements of the plan.   Orders Placed This Encounter  Procedures  . Microalbumin / creatinine urine ratio  . CBC  . Comprehensive metabolic panel  . Lipid panel  . Ambulatory referral to Pain Clinic  . POCT glucose (manual entry)  . POCT glycosylated hemoglobin (Hb A1C)     Requested Prescriptions   Signed Prescriptions Disp Refills  . dapagliflozin propanediol (FARXIGA) 5 MG TABS tablet 30 tablet 5    Sig: Take 1 tablet (5 mg total) by mouth daily before breakfast.  . furosemide (LASIX) 40 MG tablet 30 tablet 6    Sig: Take 1 tablet (40 mg total) by mouth daily.  . traMADol (ULTRAM) 50 MG tablet 40 tablet 0    Sig: Take 1 tablet (50 mg total) by mouth every 8 (eight) hours as needed.    Return in about 3 months (around 08/14/2020).  Karle Plumber, MD, FACP

## 2020-05-14 NOTE — Patient Instructions (Signed)
Start Farxiga 5 mg to help better control your diabetes.   Diabetes Mellitus and Nutrition, Adult When you have diabetes (diabetes mellitus), it is very important to have healthy eating habits because your blood sugar (glucose) levels are greatly affected by what you eat and drink. Eating healthy foods in the appropriate amounts, at about the same times every day, can help you:  Control your blood glucose.  Lower your risk of heart disease.  Improve your blood pressure.  Reach or maintain a healthy weight. Every person with diabetes is different, and each person has different needs for a meal plan. Your health care provider may recommend that you work with a diet and nutrition specialist (dietitian) to make a meal plan that is best for you. Your meal plan may vary depending on factors such as:  The calories you need.  The medicines you take.  Your weight.  Your blood glucose, blood pressure, and cholesterol levels.  Your activity level.  Other health conditions you have, such as heart or kidney disease. How do carbohydrates affect me? Carbohydrates, also called carbs, affect your blood glucose level more than any other type of food. Eating carbs naturally raises the amount of glucose in your blood. Carb counting is a method for keeping track of how many carbs you eat. Counting carbs is important to keep your blood glucose at a healthy level, especially if you use insulin or take certain oral diabetes medicines. It is important to know how many carbs you can safely have in each meal. This is different for every person. Your dietitian can help you calculate how many carbs you should have at each meal and for each snack. Foods that contain carbs include:  Bread, cereal, rice, pasta, and crackers.  Potatoes and corn.  Peas, beans, and lentils.  Milk and yogurt.  Fruit and juice.  Desserts, such as cakes, cookies, ice cream, and candy. How does alcohol affect me? Alcohol can  cause a sudden decrease in blood glucose (hypoglycemia), especially if you use insulin or take certain oral diabetes medicines. Hypoglycemia can be a life-threatening condition. Symptoms of hypoglycemia (sleepiness, dizziness, and confusion) are similar to symptoms of having too much alcohol. If your health care provider says that alcohol is safe for you, follow these guidelines:  Limit alcohol intake to no more than 1 drink per day for nonpregnant women and 2 drinks per day for men. One drink equals 12 oz of beer, 5 oz of wine, or 1 oz of hard liquor.  Do not drink on an empty stomach.  Keep yourself hydrated with water, diet soda, or unsweetened iced tea.  Keep in mind that regular soda, juice, and other mixers may contain a lot of sugar and must be counted as carbs. What are tips for following this plan?  Reading food labels  Start by checking the serving size on the "Nutrition Facts" label of packaged foods and drinks. The amount of calories, carbs, fats, and other nutrients listed on the label is based on one serving of the item. Many items contain more than one serving per package.  Check the total grams (g) of carbs in one serving. You can calculate the number of servings of carbs in one serving by dividing the total carbs by 15. For example, if a food has 30 g of total carbs, it would be equal to 2 servings of carbs.  Check the number of grams (g) of saturated and trans fats in one serving. Choose foods that have  low or no amount of these fats.  Check the number of milligrams (mg) of salt (sodium) in one serving. Most people should limit total sodium intake to less than 2,300 mg per day.  Always check the nutrition information of foods labeled as "low-fat" or "nonfat". These foods may be higher in added sugar or refined carbs and should be avoided.  Talk to your dietitian to identify your daily goals for nutrients listed on the label. Shopping  Avoid buying canned, premade, or  processed foods. These foods tend to be high in fat, sodium, and added sugar.  Shop around the outside edge of the grocery store. This includes fresh fruits and vegetables, bulk grains, fresh meats, and fresh dairy. Cooking  Use low-heat cooking methods, such as baking, instead of high-heat cooking methods like deep frying.  Cook using healthy oils, such as olive, canola, or sunflower oil.  Avoid cooking with butter, cream, or high-fat meats. Meal planning  Eat meals and snacks regularly, preferably at the same times every day. Avoid going long periods of time without eating.  Eat foods high in fiber, such as fresh fruits, vegetables, beans, and whole grains. Talk to your dietitian about how many servings of carbs you can eat at each meal.  Eat 4-6 ounces (oz) of lean protein each day, such as lean meat, chicken, fish, eggs, or tofu. One oz of lean protein is equal to: ? 1 oz of meat, chicken, or fish. ? 1 egg. ?  cup of tofu.  Eat some foods each day that contain healthy fats, such as avocado, nuts, seeds, and fish. Lifestyle  Check your blood glucose regularly.  Exercise regularly as told by your health care provider. This may include: ? 150 minutes of moderate-intensity or vigorous-intensity exercise each week. This could be brisk walking, biking, or water aerobics. ? Stretching and doing strength exercises, such as yoga or weightlifting, at least 2 times a week.  Take medicines as told by your health care provider.  Do not use any products that contain nicotine or tobacco, such as cigarettes and e-cigarettes. If you need help quitting, ask your health care provider.  Work with a Social worker or diabetes educator to identify strategies to manage stress and any emotional and social challenges. Questions to ask a health care provider  Do I need to meet with a diabetes educator?  Do I need to meet with a dietitian?  What number can I call if I have questions?  When are the  best times to check my blood glucose? Where to find more information:  American Diabetes Association: diabetes.org  Academy of Nutrition and Dietetics: www.eatright.[employer] of Diabetes and Digestive and Kidney Diseases (NIH): [employer] Summary  A healthy meal plan will help you control your blood glucose and maintain a healthy lifestyle.  Working with a diet and nutrition specialist (dietitian) can help you make a meal plan that is best for you.  Keep in mind that carbohydrates (carbs) and alcohol have immediate effects on your blood glucose levels. It is important to count carbs and to use alcohol carefully. This information is not intended to replace advice given to you by your health care provider. Make sure you discuss any questions you have with your health care provider. Document Revised: 06/04/2017 Document Reviewed: 07/27/2016 Elsevier Patient Education  2020 [employer].

## 2020-05-15 ENCOUNTER — Telehealth: Payer: Self-pay | Admitting: Internal Medicine

## 2020-05-15 LAB — MICROALBUMIN / CREATININE URINE RATIO
Creatinine, Urine: 47.9 mg/dL
Microalb/Creat Ratio: <6 mg/g creat (ref 0–29)
Microalbumin, Urine: <3 ug/mL

## 2020-05-15 LAB — COMPREHENSIVE METABOLIC PANEL
ALT: 22 IU/L (ref 0–32)
AST: 21 IU/L (ref 0–40)
Albumin/Globulin Ratio: 1.8 (ref 1.2–2.2)
Albumin: 4.4 g/dL (ref 3.8–4.8)
Alkaline Phosphatase: 138 IU/L — ABNORMAL HIGH (ref 44–121)
BUN/Creatinine Ratio: 13 (ref 12–28)
BUN: 15 mg/dL (ref 8–27)
Bilirubin Total: <0.2 mg/dL (ref 0.0–1.2)
CO2: 34 mmol/L — ABNORMAL HIGH (ref 20–29)
Calcium: 9.6 mg/dL (ref 8.7–10.3)
Chloride: 98 mmol/L (ref 96–106)
Creatinine, Ser: 1.16 mg/dL — ABNORMAL HIGH (ref 0.57–1.00)
GFR calc Af Amer: 56 mL/min/{1.73_m2} — ABNORMAL LOW (ref >59)
GFR calc non Af Amer: 48 mL/min/{1.73_m2} — ABNORMAL LOW (ref >59)
Globulin, Total: 2.5 g/dL (ref 1.5–4.5)
Glucose: 157 mg/dL — ABNORMAL HIGH (ref 65–99)
Potassium: 3.3 mmol/L — ABNORMAL LOW (ref 3.5–5.2)
Sodium: 145 mmol/L — ABNORMAL HIGH (ref 134–144)
Total Protein: 6.9 g/dL (ref 6.0–8.5)

## 2020-05-15 LAB — CBC
Hematocrit: 42.7 % (ref 34.0–46.6)
Hemoglobin: 13.4 g/dL (ref 11.1–15.9)
MCH: 27.1 pg (ref 26.6–33.0)
MCHC: 31.4 g/dL — ABNORMAL LOW (ref 31.5–35.7)
MCV: 86 fL (ref 79–97)
Platelets: 243 10*3/uL (ref 150–450)
RBC: 4.94 x10E6/uL (ref 3.77–5.28)
RDW: 13.8 % (ref 11.7–15.4)
WBC: 9.4 10*3/uL (ref 3.4–10.8)

## 2020-05-15 LAB — LIPID PANEL
Chol/HDL Ratio: 2.4 ratio (ref 0.0–4.4)
Cholesterol, Total: 138 mg/dL (ref 100–199)
HDL: 57 mg/dL (ref >39)
LDL Chol Calc (NIH): 54 mg/dL (ref 0–99)
Triglycerides: 164 mg/dL — ABNORMAL HIGH (ref 0–149)
VLDL Cholesterol Cal: 27 mg/dL (ref 5–40)

## 2020-05-15 MED ORDER — FAMOTIDINE 20 MG PO TABS: 20.0000 mg | ORAL_TABLET | Freq: Two times a day (BID) | ORAL | 4 refills | Status: DC

## 2020-05-15 NOTE — Telephone Encounter (Signed)
Phone call placed to patient today to discuss lab results.  Patient informed that potassium level is low.  She admits that she has not been taking the potassium supplement consistently.  Takes it only if she gets muscle cramps.  Advised patient to take potassium supplement daily as long as she is on the furosemide as furosemide can cause potassium loss in the urine. -Informed that kidney level is not 100% and sodium level slightly elevated.  Advised to make sure that she drinks about 2 to 4 glasses of water a day.  We do not want to overdo it with fluids given that she has history of congestive heart failure.  Advised to avoid NSAIDs.  She states that she takes only Tylenol when needed from over-the-counter.  Cholesterol levels normal. Patient requesting refill on famotidine.  States that she was taking 20 mg twice a day for heartburn but she ran out and forgot to ask for refills yesterday.  Prescription to be sent to her pharmacy.

## 2020-05-20 ENCOUNTER — Other Ambulatory Visit: Payer: Self-pay

## 2020-05-20 NOTE — Patient Outreach (Signed)
[place] [facility]) Care Management  05/20/2020  Heidi [name] [birth year] [id number]   Telephone Screen  Referral Date: 05/20/2020 Referral Source: Nurse Line Referral Reason:  Pt called to inquired into normal blood pressure reading and HR readings. Pt had a BP reading of 126/67/67 and a blood sugar reading of 108. Discussed normal reading for blood pressure, heart rate, and blood sugar. Pt did have a flu shot a few days ago. Denied SOB, chest pain. Pt in no respiratory distress.  Insurance: [employer]   Outreach attempt # 1 to patient. No answer. RN CM left HIPAA compliant voicemail message along with contact info.    Plan: RN CM will make outreach attempt to patient within 3-4 business days. RN CM will send unsuccessful outreach letter to patient.   Enzo Montgomery, RN,BSN,CCM [place] Management Telephonic Care Management Coordinator Direct Phone: [phone] Toll Free: [phone] Fax: [phone]

## 2020-05-21 ENCOUNTER — Other Ambulatory Visit: Payer: Self-pay

## 2020-05-21 NOTE — Patient Outreach (Signed)
[place] [facility]) Care Management  05/21/2020  Heidi [name] Jan 02, 1951 [id number]   Telephone Screen  Referral Date: 05/20/2020 Referral Source: Nurse Line Referral Reason:  Pt called to inquired into normal blood pressure reading and HR readings.Pt had a BP reading of 126/67/67 and a blood sugar reading of 108.Discussed normal reading for blood pressure, heart rate, and blood sugar.Pt did have a flu shot a few days ago.Denied SOB, chest pain.Pt in no respiratory distress. Insurance: [employer]    Outreach attempt #2 to patient. No answer at present.      Plan: RN CM will make outreach attempt to patient within 3-4 business days.  Enzo Montgomery, RN,BSN,CCM [place] Management Telephonic Care Management Coordinator Direct Phone: [phone] Toll Free: [phone] Fax: [phone]

## 2020-05-22 ENCOUNTER — Other Ambulatory Visit: Payer: Self-pay

## 2020-05-22 NOTE — Patient Outreach (Signed)
[place] [facility]) Care Management  05/22/2020  Heidi [name] [birth year] [id number]   Telephone Screen  Referral Date:05/20/2020 Referral Source:Nurse Line Referral Reason:Pt called to inquired into normal blood pressure reading and HR readings.Pt had a BP reading of 126/67/67 and a blood sugar reading of 108.Discussed normal reading for blood pressure, heart rate, and blood sugar.Pt did have a flu shot a few days ago.Denied SOB, chest pain.Pt in no respiratory distress. Insurance:[employer]    Unsuccessful utreach attempt #3 to patient.    Plan: RN CM will make outreach attempt to patient within 3-4 wks if no response from letter mailed to patient.  Enzo Montgomery, RN,BSN,CCM [place] Management Telephonic Care Management Coordinator Direct Phone: [phone] Toll Free: [phone] Fax: [phone]

## 2020-05-25 DIAGNOSIS — R079 Chest pain, unspecified: Secondary | ICD-10-CM | POA: Diagnosis not present

## 2020-05-25 DIAGNOSIS — I509 Heart failure, unspecified: Secondary | ICD-10-CM | POA: Diagnosis not present

## 2020-06-06 ENCOUNTER — Other Ambulatory Visit: Payer: Self-pay | Admitting: Internal Medicine

## 2020-06-06 ENCOUNTER — Telehealth: Payer: Self-pay | Admitting: Internal Medicine

## 2020-06-06 DIAGNOSIS — I5032 Chronic diastolic (congestive) heart failure: Secondary | ICD-10-CM

## 2020-06-06 DIAGNOSIS — M4302 Spondylolysis, cervical region: Secondary | ICD-10-CM

## 2020-06-06 MED ORDER — FUROSEMIDE 40 MG PO TABS: 40.0000 mg | ORAL_TABLET | Freq: Every day | ORAL | 3 refills | Status: DC

## 2020-06-06 NOTE — Telephone Encounter (Signed)
Requested medication (s) are due for refill today: Yes  Requested medication (s) are on the active medication list: Yes  Last refill:  05/14/20  Future visit scheduled: Yes  Notes to clinic:  See request.    Requested Prescriptions  Pending Prescriptions Disp Refills   traMADol (ULTRAM) 50 MG tablet 40 tablet 0    Sig: Take 1 tablet (50 mg total) by mouth every 8 (eight) hours as needed.      Not Delegated - Analgesics:  Opioid Agonists Failed - 06/06/2020  3:49 PM      Failed - This refill cannot be delegated      Failed - Urine Drug Screen completed in last 360 days      Passed - Valid encounter within last 6 months    Recent Outpatient Visits           3 weeks ago Type 2 diabetes mellitus with stage 2 chronic kidney disease, unspecified whether long term insulin use ([place])   [place], Neoma Laming B, MD   5 months ago Type 2 diabetes mellitus with stage 2 chronic kidney disease, unspecified whether long term insulin use [facility])   [place], [place], [place]   6 months ago Need for vaccination against Streptococcus pneumoniae   [place], Annie Main L, RPH-CPP   6 months ago 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, Dionne Bu[place], [place]       Future Appointments             In 2 months Ladell Pier, MD [place]   In 4 months Melvyn Novas, Christena Deem, MD [facility] Pulmonary Care             Signed Prescriptions Disp Refills   furosemide (LASIX) 40 MG tablet 30 tablet 3    Sig: Take 1 tablet (40 mg total) by mouth daily.       Cardiovascular:  Diuretics - Loop Failed - 06/06/2020  3:49 PM      Failed - K in normal range and within 360 days    Potassium  Date Value Ref Range Status  05/14/2020 3.3 (L) 3.5 - 5.2 mmol/L Final          Failed - Na in normal range and within 360 days    Sodium  Date Value Ref Range Status  05/14/2020 145 (H) 134 - 144 mmol/L Final          Failed - Cr in normal range and within 360 days    Creat  Date Value Ref Range Status  05/12/2016 1.14 (H) 0.50 - 0.99 mg/dL Final    Comment:      For patients > or = 68 years of age: The upper reference limit for Creatinine is approximately 13% higher for people identified as African-American.      Creatinine, Ser  Date Value Ref Range Status  05/14/2020 1.16 (H) 0.57 - 1.00 mg/dL Final   Creatinine, Urine  Date Value Ref Range Status  11/27/2019 146.00 mg/dL Final    Comment:    Performed at [facility], [place],  [place] [address]          Passed - Ca in normal range and within 360 days    Calcium  Date Value Ref Range Status  05/14/2020 9.6 8.7 - 10.3 mg/dL Final   Calcium, Ion  Date Value Ref Range Status  01/27/2018 1.13 (L) 1.15 - 1.40 mmol/L Final          Passed - Last BP in normal range    BP Readings from Last 1 Encounters:  05/14/20 137/77          Passed - Valid encounter within last 6 months    Recent Outpatient Visits           3 weeks ago Type 2 diabetes mellitus with stage 2 chronic kidney disease, unspecified whether long term insulin use ([place])   [place], Neoma Laming B, MD   5 months ago Type 2 diabetes mellitus with stage 2 chronic kidney disease, unspecified whether long term insulin use [facility])   [place], [place], [place]   6 months ago Need for vaccination against Streptococcus pneumoniae   [place], Jarome Matin, RPH-CPP   6 months ago Controlled  type 2 diabetes mellitus with retinopathy of right eye, with long-term current use of insulin, macular edema presence unspecified, unspecified retinopathy severity [facility])   [place], MD   9 months ago Chest pain, unspecified type   [place], Dionne [place], [place]       Future Appointments             In 2 months Wynetta Emery, Dalbert Batman, MD [place]   In 4 months Melvyn Novas, Christena Deem, MD [facility] Pulmonary Care

## 2020-06-06 NOTE — Telephone Encounter (Signed)
Medication: furosemide (LASIX) 40 MG tablet [[id number]] , traMADol (ULTRAM) 50 MG tablet [[id number]]   Has the patient contacted their pharmacy? YES  (Agent: If no, request that the patient contact the pharmacy for the refill.) (Agent: If yes, when and what did the pharmacy advise?)  Preferred Pharmacy (with phone number or street name): [facility] [place], [address]  [place], [place] [address]  Phone:  [phone] Fax:  [phone]   Agent: Please be advised that RX refills may take up to 3 business days. We ask that you follow-up with your pharmacy.

## 2020-06-06 NOTE — Telephone Encounter (Signed)
Referral was sent [facility] for Pain Management Ph. # [phone] .They have different locations in [place] and [place]  .

## 2020-06-06 NOTE — Telephone Encounter (Signed)
Patient was given the contact information for Aloha Gell, MD in [facility]. Patient states that she is prefer a pain management in [place] is fine.  Patient states she will call back with a name of a doctor she would like to be referred to in [place].

## 2020-06-07 ENCOUNTER — Institutional Professional Consult (permissible substitution): Payer: Medicare HMO | Admitting: Pulmonary Disease

## 2020-06-08 ENCOUNTER — Other Ambulatory Visit: Payer: Self-pay | Admitting: Internal Medicine

## 2020-06-08 DIAGNOSIS — M4302 Spondylolysis, cervical region: Secondary | ICD-10-CM

## 2020-06-08 MED ORDER — TRAMADOL HCL 50 MG PO TABS: 50.0000 mg | ORAL_TABLET | Freq: Three times a day (TID) | ORAL | 0 refills | Status: DC | PRN

## 2020-06-11 ENCOUNTER — Other Ambulatory Visit: Payer: Self-pay

## 2020-06-11 NOTE — Patient Outreach (Signed)
[place] [facility]) Care Management  06/11/2020  Heidi [name] Aug 28, 1950 [id number]    Telephone Screen  Referral Date:05/20/2020 Referral Source:Nurse Line Referral Reason:Pt called to inquired into normal blood pressure reading and HR readings.Pt had a BP reading of 126/67/67 and a blood sugar reading of 108.Discussed normal reading for blood pressure, heart rate, and blood sugar.Pt did have a flu shot a few days ago.Denied SOB, chest pain.Pt in no respiratory distress. Insurance:[employer]   Multiple attempts to establish contact with patient without success. No response from letter mailed to patient. Case is being closed at this time.    Plan: RN CM will close case at this time.   Enzo Montgomery, RN,BSN,CCM [place] Management Telephonic Care Management Coordinator Direct Phone: [phone] Toll Free: [phone] Fax: [phone]

## 2020-06-12 ENCOUNTER — Ambulatory Visit: Payer: Self-pay

## 2020-06-13 ENCOUNTER — Encounter: Payer: Self-pay | Admitting: Internal Medicine

## 2020-06-13 ENCOUNTER — Ambulatory Visit: Payer: Medicare HMO | Admitting: Internal Medicine

## 2020-06-13 VITALS — BP 139/77 | HR 78 | Ht 64.0 in | Wt 237.8 lb

## 2020-06-13 DIAGNOSIS — Z86010 Personal history of colonic polyps: Secondary | ICD-10-CM | POA: Diagnosis not present

## 2020-06-13 NOTE — Progress Notes (Signed)
HISTORY OF PRESENT ILLNESS:  Heidi [name] is a 69 y.o. female with multiple significant medical problems including morbid obesity, diabetes mellitus, congestive heart failure, bilateral pneumonia, chronic lung disease requiring chronic oxygen therapy.  Last seen in this office June 2021 she is interested in surveillance colonoscopy.  There is family history of colon cancer in 2 first-degree relatives including her parent and sibling.  She has a history of multiple adenomatous colon polyps.  Last colonoscopy April 2018.  This time for last evaluation symptoms convalescing from her hospitalization.  Follow-up at this time to reassess her clinical status was recommended.  Patient tells me that she has been stable.  Doing better with passage of time.  Currently on 1 L of oxygen regularly.  We discussed not performing colonoscopy or alternative evaluation methods (radiology).  She is quite adamant about colonoscopy experience with family members.  REVIEW OF SYSTEMS:  All non-GI ROS negative as otherwise stated in the HPI except for anxiety, back pain, muscle cramps, hearing problems, skin rash, headaches, ankle swelling  Past Medical History:  Diagnosis Date  . Adenomatous colon polyp   . Anemia   . Anxiety   . Asthma   . CHF (congestive heart failure) ([place])   . Diabetes mellitus without complication ([place])   . High cholesterol   . Hypertension   . Left knee injury   . Pneumonia     Past Surgical History:  Procedure Laterality Date  . ABDOMINAL HYSTERECTOMY    . CATARACT EXTRACTION Left   . CATARACT EXTRACTION     LT 01/2017, 02/2017 on RT  . [place]  reports that she has never smoked. She has never used smokeless tobacco. She reports that she does not drink alcohol and does not use drugs.  family history includes Colon cancer in her father and sister; Diabetes type II in her brother and sister.  Allergies  Allergen Reactions  .  Januvia [Sitagliptin]     Chest pains       PHYSICAL EXAMINATION: Vital signs: BP 139/77   Pulse 78   Ht 5\' 4"  (1.626 m)   Wt 237 lb 12.8 oz (107.9 kg)   SpO2 90%   BMI 40.82 kg/m   Constitutional: Obese chronically ill-appearing female, no acute distress.  Has nasal cannula oxygen in place Psychiatric: alert and oriented x3, cooperative Eyes: extraocular movements intact, anicteric, conjunctiva pink Mouth: oral pharynx moist, no lesions Neck: supple no lymphadenopathy Cardiovascular: heart regular rate and rhythm, no murmur Lungs: clear to auscultation bilaterally Abdomen: soft, obese, nontender, nondistended, no obvious ascites, no peritoneal signs, normal bowel sounds, no organomegaly Rectal: Deferred until colonoscopy Extremities: no clubbing or cyanosis.  1+ lower extremity edema bilaterally Skin: no lesions on visible extremities Neuro: No focal deficits.  Cranial nerves intact  ASSESSMENT:  1.  Personal history of multiple adenomatous colon polyps.  Due for surveillance 2.  Multiple first-degree relatives with colon cancer 3.  Multiple significant comorbid conditions including insulin requiring diabetes, obesity, and oxygen chronic lung disease.   PLAN:  1.  Surveillance colonoscopy.  This needs form hospital under monitored anesthesia care due to her comorbidities.  She is HIGH RISK.  She understands.The nature of the procedure, as well as the risks, benefits, and alternatives were carefully and thoroughly reviewed with the patient. Ample time for discussion and questions allowed. The patient understood, was satisfied, and agreed to proceed. 2.  Hold morning insulin the day  of the procedure

## 2020-06-13 NOTE — Patient Instructions (Signed)
I will call you in a few weeks to schedule your colonoscopy at the hospital.

## 2020-06-24 DIAGNOSIS — I509 Heart failure, unspecified: Secondary | ICD-10-CM | POA: Diagnosis not present

## 2020-06-24 DIAGNOSIS — R079 Chest pain, unspecified: Secondary | ICD-10-CM | POA: Diagnosis not present

## 2020-06-28 ENCOUNTER — Other Ambulatory Visit: Payer: Self-pay | Admitting: Internal Medicine

## 2020-06-28 DIAGNOSIS — E785 Hyperlipidemia, unspecified: Secondary | ICD-10-CM

## 2020-07-19 ENCOUNTER — Other Ambulatory Visit: Payer: Self-pay | Admitting: Internal Medicine

## 2020-07-19 DIAGNOSIS — E1165 Type 2 diabetes mellitus with hyperglycemia: Secondary | ICD-10-CM

## 2020-07-19 DIAGNOSIS — IMO0002 Reserved for concepts with insufficient information to code with codable children: Secondary | ICD-10-CM

## 2020-07-19 NOTE — Telephone Encounter (Signed)
Requested medication (s) are due for refill today -yes  Requested medication (s) are on the active medication list -yes  Future visit scheduled -yes  Last refill: 06/17/20  Notes to clinic: Request medication not showing protocol- sent for review   Requested Prescriptions  Pending Prescriptions Disp Refills   INSULIN SYRINGE 1CC/29G 29G X 1/2" 1 ML MISC [Pharmacy Med Name: [facility] SYR/NDL 29G 1ML U/F] 100 each 3    Sig: USE THREE TIMES DAILY AS DIRECTED      There is no refill protocol information for this order        Requested Prescriptions  Pending Prescriptions Disp Refills   INSULIN SYRINGE 1CC/29G 29G X 1/2" 1 ML MISC [Pharmacy Med Name: [facility] SYR/NDL 29G 1ML U/F] 100 each 3    Sig: USE THREE TIMES DAILY AS DIRECTED      There is no refill protocol information for this order

## 2020-07-29 ENCOUNTER — Telehealth: Payer: Self-pay | Admitting: Neurology

## 2020-07-29 NOTE — Telephone Encounter (Signed)
Received approval via fax from [facility]. PA #[id number] (07/29/20- 07/29/21).

## 2020-07-29 NOTE — Telephone Encounter (Signed)
Patient has a Botox appointment 2/1. She appears to have had an insurance change, now [employer]. I filled out PA request on [employer] portal and uploaded office notes to support request. Waiting for response.

## 2020-08-01 ENCOUNTER — Ambulatory Visit (INDEPENDENT_AMBULATORY_CARE_PROVIDER_SITE_OTHER): Payer: Medicare HMO

## 2020-08-01 ENCOUNTER — Other Ambulatory Visit: Payer: Self-pay | Admitting: *Deleted

## 2020-08-01 ENCOUNTER — Encounter (HOSPITAL_COMMUNITY): Payer: Self-pay

## 2020-08-01 ENCOUNTER — Other Ambulatory Visit: Payer: Self-pay

## 2020-08-01 ENCOUNTER — Ambulatory Visit (HOSPITAL_COMMUNITY)
Admission: EM | Admit: 2020-08-01 | Discharge: 2020-08-01 | Disposition: A | Discharge status: Home / Self Care | Payer: Medicare HMO | Attending: Internal Medicine | Admitting: Internal Medicine

## 2020-08-01 DIAGNOSIS — U071 COVID-19: Secondary | ICD-10-CM | POA: Insufficient documentation

## 2020-08-01 DIAGNOSIS — J45909 Unspecified asthma, uncomplicated: Secondary | ICD-10-CM | POA: Diagnosis not present

## 2020-08-01 DIAGNOSIS — Z9981 Dependence on supplemental oxygen: Secondary | ICD-10-CM | POA: Diagnosis not present

## 2020-08-01 DIAGNOSIS — Z794 Long term (current) use of insulin: Secondary | ICD-10-CM | POA: Diagnosis not present

## 2020-08-01 DIAGNOSIS — Z7951 Long term (current) use of inhaled steroids: Secondary | ICD-10-CM | POA: Insufficient documentation

## 2020-08-01 DIAGNOSIS — Z79899 Other long term (current) drug therapy: Secondary | ICD-10-CM | POA: Diagnosis not present

## 2020-08-01 DIAGNOSIS — B349 Viral infection, unspecified: Secondary | ICD-10-CM | POA: Diagnosis not present

## 2020-08-01 DIAGNOSIS — J441 Chronic obstructive pulmonary disease with (acute) exacerbation: Secondary | ICD-10-CM | POA: Diagnosis not present

## 2020-08-01 DIAGNOSIS — R0602 Shortness of breath: Secondary | ICD-10-CM | POA: Diagnosis not present

## 2020-08-01 DIAGNOSIS — J449 Chronic obstructive pulmonary disease, unspecified: Secondary | ICD-10-CM | POA: Diagnosis not present

## 2020-08-01 DIAGNOSIS — R059 Cough, unspecified: Secondary | ICD-10-CM | POA: Diagnosis not present

## 2020-08-01 DIAGNOSIS — Z7982 Long term (current) use of aspirin: Secondary | ICD-10-CM | POA: Insufficient documentation

## 2020-08-01 HISTORY — DX: Chronic obstructive pulmonary disease, unspecified: J44.9

## 2020-08-01 LAB — SARS CORONAVIRUS 2 (TAT 6-24 HRS): SARS Coronavirus 2: POSITIVE — AB

## 2020-08-01 MED ORDER — ALBUTEROL SULFATE HFA 108 (90 BASE) MCG/ACT IN AERS
1.0000 | INHALATION_SPRAY | RESPIRATORY_TRACT | 3 refills | Status: DC | PRN
Start: 2020-08-01 — End: 2020-10-21

## 2020-08-01 MED ORDER — ADVAIR HFA 115-21 MCG/ACT IN AERO: 2.0000 | INHALATION_SPRAY | Freq: Two times a day (BID) | RESPIRATORY_TRACT | 2 refills | Status: DC

## 2020-08-01 MED ORDER — AZITHROMYCIN 250 MG PO TABS: ORAL_TABLET | ORAL | 0 refills | Status: DC

## 2020-08-01 MED ORDER — GUAIFENESIN ER 600 MG PO TB12: 600.0000 mg | ORAL_TABLET | Freq: Two times a day (BID) | ORAL | 0 refills | Status: DC

## 2020-08-01 MED ORDER — BENZONATATE 100 MG PO CAPS: 100.0000 mg | ORAL_CAPSULE | Freq: Three times a day (TID) | ORAL | 0 refills | Status: DC

## 2020-08-01 NOTE — Discharge Instructions (Signed)
Please take medications as recommended Please quarantine until COVID-19 test results are available Increase oral fluid intake We will call you with results if labs are abnormal If symptoms worsen please go to the emergency department to be evaluated further.

## 2020-08-01 NOTE — ED Provider Notes (Signed)
[facility]    CSN: [id number] Arrival date & time: 08/01/20  1317      History   Chief Complaint Chief Complaint  Patient presents with  . Fever  . Cough  . Sore Throat  . Headache    HPI Heidi [name] is a 70 y.o. female comes to the urgent care with complaints of cough productive of greenish sputum, sore throat, headaches, fever and chills of 3 days duration.  Patient has a history of COPD currently on 2 L of oxygen via nasal cannula.  Symptom onset was fairly abrupt and has been persistent.  She complains of shortness of breath on ambulation beyond her baseline.  She is complaining of wheezing.  No chest pain or chest pressure.  She has some chest tightness.  No nausea, vomiting or diarrhea.  She admits having generalized body aches.  Patient is not vaccinated against COVID-19 virus.  Her family members were diagnosed with COVID-19 infection earlier last week. HPI  Past Medical History:  Diagnosis Date  . Adenomatous colon polyp   . Anemia   . Anxiety   . Asthma   . CHF (congestive heart failure) ([place])   . COPD (chronic obstructive pulmonary disease) ([place])   . Diabetes mellitus without complication ([place])   . High cholesterol   . Hypertension   . Left knee injury   . Pneumonia     Patient Active Problem List   Diagnosis Date Noted  . Encounter for medication review and counseling 03/18/2020  . Medication management 02/28/2020  . Exertional dyspnea 01/03/2020  . Multifocal pneumonia 11/24/2019  . Acute respiratory failure ([place]) 11/24/2019  . Breast nodule 11/24/2019  . Chronic respiratory failure with hypoxia, on home oxygen therapy ([place]) 11/13/2019  . Atypical chest pain 08/24/2019  . Cervical myelopathy ([place]) 08/16/2018  . Diabetic retinopathy of right eye associated with type 2 diabetes mellitus ([place]) 08/16/2018  . Upper airway cough syndrome 05/03/2018  . Morbid obesity due to excess calories ([place]) complicated by hbp/ dm / hyperlipidemia  09/10/2017  . Cough variant asthma vs UACS/vcd on ACEi  09/09/2017  . Anxiety and depression 09/06/2017  . Mild persistent asthma without complication 40/98/1191  . Paranoia ([place]) 09/06/2017  . Torn left ear lobe 11/20/2016  . Chronic migraine w/o aura w/o status migrainosus, not intractable 06/27/2016  . Post-menopausal atrophic vaginitis 11/14/2015  . Primary osteoarthritis of both knees 07/18/2015  . GERD (gastroesophageal reflux disease) 05/08/2015  . Renal cyst 05/08/2015  . Adenomatous polyp of colon 03/26/2015  . Essential hypertension 02/12/2015  . Cognitive deficit due to old head trauma 02/12/2015  . Asthma, chronic 02/12/2015  . Chronic left shoulder pain 02/11/2015  . Thyroid nodule 12/18/2014  . Chronic neck pain   . HLD (hyperlipidemia)   . OSA treated with BiPAP 11/27/2014  . Obesity hypoventilation syndrome ([place]) 11/27/2014  . Demand ischemia ([place])   . Uncontrolled type 2 diabetes mellitus with hyperglycemia ([place])   . Cardiomegaly 11/25/2014  . CHF (congestive heart failure) ([place]) 11/25/2014  . DM type 2 causing CKD stage 2 ([place]) 11/25/2014    Past Surgical History:  Procedure Laterality Date  . ABDOMINAL HYSTERECTOMY    . CATARACT EXTRACTION Left   . CATARACT EXTRACTION     LT 01/2017, 02/2017 on RT  . CESAREAN SECTION      OB History   No obstetric history on file.      Home Medications    Prior to Admission medications   Medication  Sig Start Date End Date Taking? Authorizing Provider  azithromycin (ZITHROMAX Z-PAK) 250 MG tablet Please take 2 tablets orally on day 1 then 1 tablet orally daily for days 2 through 5 08/01/20  Yes Arica Bevilacqua, Myrene Galas, MD  benzonatate (TESSALON) 100 MG capsule Take 1 capsule (100 mg total) by mouth every 8 (eight) hours. 08/01/20  Yes Darlisha Kelm, Myrene Galas, MD  fluticasone-salmeterol (ADVAIR HFA) [phone] MCG/ACT inhaler Inhale 2 puffs into the lungs 2 (two) times daily. 08/01/20  Yes Hakeen Shipes, Myrene Galas, MD  guaiFENesin (MUCINEX) 600  MG 12 hr tablet Take 1 tablet (600 mg total) by mouth 2 (two) times daily for 10 days. 08/01/20 08/11/20 Yes LampteyMyrene Galas, MD  Accu-Chek FastClix Lancets MISC Use as directed to test blood sugar three times daily DX E11.65 04/10/20   Ladell Pier, MD  albuterol (PROVENTIL) (2.5 MG/3ML) 0.083% nebulizer solution USE 1 VIAL VIA NEBULIZER EVERY 6 HOURS AS NEEDED FOR WHEEZING OR SHORTNESS OF BREATH 02/21/18   Ladell Pier, MD  albuterol (VENTOLIN HFA) 108 (90 Base) MCG/ACT inhaler Inhale 1-2 puffs into the lungs every 4 (four) hours as needed for wheezing or shortness of breath. 08/01/20   Tanda Rockers, MD  aspirin 81 MG chewable tablet Chew 81 mg by mouth daily.     [provider]  atenolol (TENORMIN) 25 MG tablet Take 1 tablet (25 mg total) by mouth daily. 08/30/19   Argentina Donovan, PA-C  atorvastatin (LIPITOR) 40 MG tablet TAKE 1 TABLET BY MOUTH DAILY AT 6 PM 06/28/20   Ladell Pier, MD  Blood Glucose Monitoring Suppl (ACCU-CHEK GUIDE ME) w/Device KIT Use as directed to test blood sugar three times daily DX E11.65 04/10/20   Ladell Pier, MD  famotidine (PEPCID) 20 MG tablet Take 1 tablet (20 mg total) by mouth 2 (two) times daily. 05/15/20   Ladell Pier, MD  furosemide (LASIX) 40 MG tablet Take 1 tablet (40 mg total) by mouth daily. 06/06/20   Ladell Pier, MD  glucose blood (ACCU-CHEK GUIDE) test strip Use as directed to test blood sugar three times daily DX E11.65 04/10/20   Ladell Pier, MD  insulin aspart protamine- aspart (NOVOLOG MIX 70/30) (70-30) 100 UNIT/ML injection Inject 0.45-0.7 mLs (45-70 Units total) into the skin See admin instructions. Inject 70 units in the morning and 45 units in the evening. 12/04/19   Bonnielee Haff, MD  INSULIN SYRINGE 1CC/29G 29G X 1/2" 1 ML MISC USE THREE TIMES DAILY AS DIRECTED 07/19/20   Ladell Pier, MD  loratadine (CLARITIN) 10 MG tablet Take 10 mg by mouth daily as needed for allergies.    [provider]  losartan (COZAAR) 50 MG tablet Take 1 tablet (50 mg total) by mouth daily. 08/30/19   Argentina Donovan, PA-C  montelukast (SINGULAIR) 10 MG tablet Take 1 tablet (10 mg total) by mouth at bedtime. 03/18/20   Tanda Rockers, MD  Multiple Vitamin (MULTIVITAMIN WITH MINERALS) TABS tablet Take 1 tablet by mouth daily.    [provider]  nitroGLYCERIN (NITROSTAT) 0.3 MG SL tablet Place 1 tablet (0.3 mg total) under the tongue every 5 (five) minutes as needed for chest pain. Max 3 tablets in 15 minutes 08/30/19   Freeman Caldron M, PA-C  olopatadine (PATANOL) 0.1 % ophthalmic solution Place 1 drop into both eyes 2 (two) times daily. 08/26/19   Asencion Noble, MD  pantoprazole (PROTONIX) 40 MG tablet TAKE 1 TABLET(40 MG) BY  MOUTH DAILY 03/30/20   Ladell Pier, MD  Potassium Chloride ER 20 MEQ TBCR TAKE 1 TABLET BY MOUTH DAILY 01/30/20   Ladell Pier, MD  tetrahydrozoline-zinc (VISINE-AC) 0.05-0.25 % ophthalmic solution Place 1 drop into both eyes 3 (three) times daily as needed.    [provider]  topiramate (TOPAMAX) 25 MG capsule TAKE 3 CAPSULES(75 MG) BY MOUTH TWICE DAILY 05/10/20   Ladell Pier, MD  traMADol (ULTRAM) 50 MG tablet Take 1 tablet (50 mg total) by mouth every 8 (eight) hours as needed (Prescription must last 1 month). 06/08/20   Ladell Pier, MD  Fexofenadine HCl [facility] ALLERGY PO) Take 1 tablet by mouth daily as needed (for allergy).  08/01/20  [provider]    Family History Family History  Problem Relation Age of Onset  . Colon cancer Father   . Diabetes type II Sister   . Diabetes type II Brother   . Colon cancer Sister   . Migraines Neg Hx   . Breast cancer Neg Hx     Social History Social History   Tobacco Use  . Smoking status: Never Smoker  . Smokeless tobacco: Never Used  Vaping Use  . Vaping Use: Never used  Substance Use Topics  . Alcohol use: No  . Drug use: No     Allergies   Januvia  [sitagliptin]   Review of Systems Review of Systems  Constitutional: Positive for chills, fatigue and fever.  HENT: Positive for congestion, rhinorrhea and sore throat.   Respiratory: Positive for cough, shortness of breath and wheezing.   Cardiovascular: Negative for chest pain and palpitations.  Gastrointestinal: Negative.   Genitourinary: Negative.   Musculoskeletal: Positive for arthralgias and myalgias.  Skin: Negative.   Neurological: Positive for headaches. Negative for dizziness, light-headedness and numbness.     Physical Exam Triage Vital Signs ED Triage Vitals  Enc Vitals Group     BP 08/01/20 1403 136/86     Pulse Rate 08/01/20 1403 90     Resp 08/01/20 1403 18     Temp 08/01/20 1403 99.8 F (37.7 C)     Temp Source 08/01/20 1403 Oral     SpO2 08/01/20 1403 99 %     Weight 08/01/20 1359 237 lb (107.5 kg)     Height 08/01/20 1359 5' 4"  (1.626 m)     Head Circumference --      Peak Flow --      Pain Score 08/01/20 1358 8     Pain Loc --      Pain Edu? --      Excl. in [place]? --    No data found.  Updated Vital Signs BP 136/86 (BP Location: Right Arm)   Pulse 90   Temp 99.8 F (37.7 C) (Oral)   Resp 18   Ht 5' 4"  (1.626 m)   Wt 107.5 kg   SpO2 99%   BMI 40.68 kg/m   Visual Acuity Right Eye Distance:   Left Eye Distance:   Bilateral Distance:    Right Eye Near:   Left Eye Near:    Bilateral Near:     Physical Exam Vitals and nursing note reviewed.  Constitutional:      Comments: Chronically ill looking female  HENT:     Mouth/Throat:     Mouth: Mucous membranes are moist. No oral lesions.     Pharynx: No pharyngeal swelling.     Tonsils: No tonsillar exudate or tonsillar abscesses.  Cardiovascular:     Rate and Rhythm: Normal rate and regular rhythm.     Heart sounds: Normal heart sounds.  Pulmonary:     Comments: Decreased air entry in the lung bases bilaterally.  No expiratory wheezing.  No rhonchi or rales. Abdominal:     General:  There is no distension.     Palpations: Abdomen is soft. There is no mass.  Neurological:     Mental Status: She is alert.      UC Treatments / Results  Labs (all labs ordered are listed, but only abnormal results are displayed) Labs Reviewed  SARS CORONAVIRUS 2 (TAT 6-24 HRS)    EKG   Radiology No results found.  Procedures Procedures (including critical care time)  Medications Ordered in UC Medications - No data to display  Initial Impression / Assessment and Plan / UC Course  I have reviewed the triage vital signs and the nursing notes.  Pertinent labs & imaging results that were available during my care of the patient were reviewed by me and considered in my medical decision making (see chart for details).     1.  Presumed COVID-19 infection: COVID-19 PCR test has been sent Patient is advised to quarantine until COVID-19 test results are available We will call the patient with recommendations if lab results are abnormal  2.  COPD with acute exacerbation: Patient is not wheezing at this time so no steroids will be given Add Advair to the albuterol Azithromycin Mucinex twice daily Tessalon Perles as needed for cough Chest x-ray peribronchial thickening with no acute lung infiltrate. Final Clinical Impressions(s) / UC Diagnoses   Final diagnoses:  None   Discharge Instructions   None    ED Prescriptions    Medication Sig Dispense Auth. Provider   benzonatate (TESSALON) 100 MG capsule Take 1 capsule (100 mg total) by mouth every 8 (eight) hours. 21 capsule Sheilyn Boehlke, Myrene Galas, MD   azithromycin (ZITHROMAX Z-PAK) 250 MG tablet Please take 2 tablets orally on day 1 then 1 tablet orally daily for days 2 through 5 6 tablet Dakai Braithwaite, Myrene Galas, MD   fluticasone-salmeterol (ADVAIR HFA) 115-21 MCG/ACT inhaler Inhale 2 puffs into the lungs 2 (two) times daily. 1 each Rice Walsh, Myrene Galas, MD   guaiFENesin (MUCINEX) 600 MG 12 hr tablet Take 1 tablet (600 mg total) by mouth  2 (two) times daily for 10 days. 20 tablet Johnae Friley, Myrene Galas, MD     PDMP not reviewed this encounter.   Chase Picket, MD 08/01/20 [phone]

## 2020-08-01 NOTE — ED Triage Notes (Addendum)
Pt presents with cough, sore throat, fever, headache for 3 days. Pt on o2 2 L at baseline. PMH of COPD and asthma. Grandchildren both tested positive for covid yesterday. Pt is not vaccinated. Pt took tylenol this am

## 2020-08-06 ENCOUNTER — Ambulatory Visit: Payer: Self-pay | Admitting: Neurology

## 2020-08-09 ENCOUNTER — Inpatient Hospital Stay (HOSPITAL_COMMUNITY)
Admission: EM | Admit: 2020-08-09 | Discharge: 2020-08-19 | DRG: 177 | Disposition: A | Discharge status: Home Health | Payer: Medicare HMO | Attending: Internal Medicine | Admitting: Internal Medicine

## 2020-08-09 ENCOUNTER — Other Ambulatory Visit: Payer: Self-pay

## 2020-08-09 ENCOUNTER — Encounter (HOSPITAL_COMMUNITY): Payer: Self-pay | Admitting: *Deleted

## 2020-08-09 ENCOUNTER — Emergency Department (HOSPITAL_COMMUNITY): Payer: Medicare HMO

## 2020-08-09 DIAGNOSIS — Z794 Long term (current) use of insulin: Secondary | ICD-10-CM

## 2020-08-09 DIAGNOSIS — R059 Cough, unspecified: Secondary | ICD-10-CM | POA: Diagnosis not present

## 2020-08-09 DIAGNOSIS — Z9119 Patient's noncompliance with other medical treatment and regimen: Secondary | ICD-10-CM

## 2020-08-09 DIAGNOSIS — U071 COVID-19: Principal | ICD-10-CM

## 2020-08-09 DIAGNOSIS — N1832 Chronic kidney disease, stage 3b: Secondary | ICD-10-CM | POA: Diagnosis present

## 2020-08-09 DIAGNOSIS — Z800 Family history of malignant neoplasm of digestive organs: Secondary | ICD-10-CM

## 2020-08-09 DIAGNOSIS — G9349 Other encephalopathy: Secondary | ICD-10-CM

## 2020-08-09 DIAGNOSIS — J9601 Acute respiratory failure with hypoxia: Secondary | ICD-10-CM | POA: Diagnosis not present

## 2020-08-09 DIAGNOSIS — Z6841 Body Mass Index (BMI) 40.0 and over, adult: Secondary | ICD-10-CM

## 2020-08-09 DIAGNOSIS — R0602 Shortness of breath: Secondary | ICD-10-CM | POA: Diagnosis not present

## 2020-08-09 DIAGNOSIS — E1165 Type 2 diabetes mellitus with hyperglycemia: Secondary | ICD-10-CM

## 2020-08-09 DIAGNOSIS — G9340 Encephalopathy, unspecified: Secondary | ICD-10-CM

## 2020-08-09 DIAGNOSIS — N189 Chronic kidney disease, unspecified: Secondary | ICD-10-CM

## 2020-08-09 DIAGNOSIS — G4733 Obstructive sleep apnea (adult) (pediatric): Secondary | ICD-10-CM | POA: Diagnosis present

## 2020-08-09 DIAGNOSIS — N179 Acute kidney failure, unspecified: Secondary | ICD-10-CM

## 2020-08-09 DIAGNOSIS — F419 Anxiety disorder, unspecified: Secondary | ICD-10-CM | POA: Diagnosis present

## 2020-08-09 DIAGNOSIS — J9622 Acute and chronic respiratory failure with hypercapnia: Secondary | ICD-10-CM | POA: Diagnosis present

## 2020-08-09 DIAGNOSIS — I517 Cardiomegaly: Secondary | ICD-10-CM | POA: Diagnosis not present

## 2020-08-09 DIAGNOSIS — I5032 Chronic diastolic (congestive) heart failure: Secondary | ICD-10-CM

## 2020-08-09 DIAGNOSIS — Z7982 Long term (current) use of aspirin: Secondary | ICD-10-CM

## 2020-08-09 DIAGNOSIS — J189 Pneumonia, unspecified organism: Secondary | ICD-10-CM

## 2020-08-09 DIAGNOSIS — J1282 Pneumonia due to coronavirus disease 2019: Secondary | ICD-10-CM | POA: Diagnosis not present

## 2020-08-09 DIAGNOSIS — R21 Rash and other nonspecific skin eruption: Secondary | ICD-10-CM | POA: Diagnosis not present

## 2020-08-09 DIAGNOSIS — R404 Transient alteration of awareness: Secondary | ICD-10-CM | POA: Diagnosis not present

## 2020-08-09 DIAGNOSIS — G9341 Metabolic encephalopathy: Secondary | ICD-10-CM | POA: Diagnosis present

## 2020-08-09 DIAGNOSIS — Z743 Need for continuous supervision: Secondary | ICD-10-CM | POA: Diagnosis not present

## 2020-08-09 DIAGNOSIS — E870 Hyperosmolality and hypernatremia: Secondary | ICD-10-CM | POA: Diagnosis present

## 2020-08-09 DIAGNOSIS — I5033 Acute on chronic diastolic (congestive) heart failure: Secondary | ICD-10-CM | POA: Diagnosis present

## 2020-08-09 DIAGNOSIS — E041 Nontoxic single thyroid nodule: Secondary | ICD-10-CM | POA: Diagnosis present

## 2020-08-09 DIAGNOSIS — I130 Hypertensive heart and chronic kidney disease with heart failure and stage 1 through stage 4 chronic kidney disease, or unspecified chronic kidney disease: Secondary | ICD-10-CM | POA: Diagnosis present

## 2020-08-09 DIAGNOSIS — Z833 Family history of diabetes mellitus: Secondary | ICD-10-CM

## 2020-08-09 DIAGNOSIS — E11319 Type 2 diabetes mellitus with unspecified diabetic retinopathy without macular edema: Secondary | ICD-10-CM

## 2020-08-09 DIAGNOSIS — R110 Nausea: Secondary | ICD-10-CM

## 2020-08-09 DIAGNOSIS — J440 Chronic obstructive pulmonary disease with acute lower respiratory infection: Secondary | ICD-10-CM | POA: Diagnosis present

## 2020-08-09 DIAGNOSIS — Z79899 Other long term (current) drug therapy: Secondary | ICD-10-CM

## 2020-08-09 DIAGNOSIS — I10 Essential (primary) hypertension: Secondary | ICD-10-CM | POA: Diagnosis present

## 2020-08-09 DIAGNOSIS — R0902 Hypoxemia: Secondary | ICD-10-CM | POA: Diagnosis not present

## 2020-08-09 DIAGNOSIS — R4182 Altered mental status, unspecified: Secondary | ICD-10-CM | POA: Diagnosis not present

## 2020-08-09 DIAGNOSIS — J9602 Acute respiratory failure with hypercapnia: Secondary | ICD-10-CM

## 2020-08-09 DIAGNOSIS — E785 Hyperlipidemia, unspecified: Secondary | ICD-10-CM | POA: Diagnosis present

## 2020-08-09 DIAGNOSIS — E872 Acidosis: Secondary | ICD-10-CM | POA: Diagnosis present

## 2020-08-09 DIAGNOSIS — J9621 Acute and chronic respiratory failure with hypoxia: Secondary | ICD-10-CM | POA: Diagnosis present

## 2020-08-09 LAB — I-STAT VENOUS BLOOD GAS, ED
Acid-Base Excess: 5 mmol/L — ABNORMAL HIGH (ref 0.0–2.0)
Bicarbonate: 31.9 mmol/L — ABNORMAL HIGH (ref 20.0–28.0)
Calcium, Ion: 1.12 mmol/L — ABNORMAL LOW (ref 1.15–1.40)
HCT: 42 % (ref 36.0–46.0)
Hemoglobin: 14.3 g/dL (ref 12.0–15.0)
O2 Saturation: 86 %
Potassium: 3.8 mmol/L (ref 3.5–5.1)
Sodium: 137 mmol/L (ref 135–145)
TCO2: 34 mmol/L — ABNORMAL HIGH (ref 22–32)
pCO2, Ven: 55.4 mmHg (ref 44.0–60.0)
pH, Ven: 7.368 (ref 7.250–7.430)
pO2, Ven: 54 mmHg — ABNORMAL HIGH (ref 32.0–45.0)

## 2020-08-09 LAB — COMPREHENSIVE METABOLIC PANEL
ALT: 28 U/L (ref 0–44)
AST: 42 U/L — ABNORMAL HIGH (ref 15–41)
Albumin: 3.4 g/dL — ABNORMAL LOW (ref 3.5–5.0)
Alkaline Phosphatase: 65 U/L (ref 38–126)
Anion gap: 13 (ref 5–15)
BUN: 28 mg/dL — ABNORMAL HIGH (ref 8–23)
CO2: 29 mmol/L (ref 22–32)
Calcium: 9.1 mg/dL (ref 8.9–10.3)
Chloride: 94 mmol/L — ABNORMAL LOW (ref 98–111)
Creatinine, Ser: 1.43 mg/dL — ABNORMAL HIGH (ref 0.44–1.00)
GFR, Estimated: 40 mL/min — ABNORMAL LOW (ref >60)
Glucose, Bld: 379 mg/dL — ABNORMAL HIGH (ref 70–99)
Potassium: 3.8 mmol/L (ref 3.5–5.1)
Sodium: 136 mmol/L (ref 135–145)
Total Bilirubin: 0.4 mg/dL (ref 0.3–1.2)
Total Protein: 6.9 g/dL (ref 6.5–8.1)

## 2020-08-09 LAB — LACTATE DEHYDROGENASE: LDH: 378 U/L — ABNORMAL HIGH (ref 98–192)

## 2020-08-09 LAB — TRIGLYCERIDES: Triglycerides: 141 mg/dL (ref <150)

## 2020-08-09 MED ORDER — ALBUTEROL SULFATE HFA 108 (90 BASE) MCG/ACT IN AERS
8.0000 | INHALATION_SPRAY | Freq: Once | RESPIRATORY_TRACT | Status: AC
  Administered 2020-08-10: 8 via RESPIRATORY_TRACT
  Filled 2020-08-09: qty 6.7

## 2020-08-09 MED ORDER — METHYLPREDNISOLONE SODIUM SUCC 125 MG IJ SOLR
125.0000 mg | Freq: Once | INTRAMUSCULAR | Status: AC
  Administered 2020-08-10: 125 mg via INTRAVENOUS
  Filled 2020-08-09: qty 2

## 2020-08-09 MED ORDER — AEROCHAMBER PLUS FLO-VU MISC
1.0000 | Freq: Once | Status: DC
  Filled 2020-08-09: qty 1

## 2020-08-09 MED ORDER — ACETAMINOPHEN 325 MG PO TABS
650.0000 mg | ORAL_TABLET | Freq: Once | ORAL | Status: AC
  Administered 2020-08-10: 650 mg via ORAL
  Filled 2020-08-09: qty 2

## 2020-08-09 NOTE — ED Triage Notes (Signed)
The pt arrived by [facility] from home the pt has been ill for about t 7-10 days tested positive for covid jan 27th [facility] by [facility] unable to get an iv is on home 02 cough legs swollen bi-laterally

## 2020-08-09 NOTE — ED Notes (Signed)
Abe People, son, [phone] would like an update when available

## 2020-08-09 NOTE — ED Provider Notes (Signed)
[facility] EMERGENCY DEPARTMENT Provider Note   CSN: [id number] Arrival date & time: 08/09/20  2224     History Chief Complaint  Patient presents with  . Altered Mental Status    Heidi [name] is a 70 y.o. female with history of chronic respiratory failure on 2 L home O2, COPD, chronic diastolic congestive heart failure, CKD stage III, OSA/obesity hypoventilation syndrome not on CPAP who presents the emergency department by EMS from home for altered mental status.  The patient was brought in by EMS on a nonrebreather.  The patient tested positive for COVID-19 on January 27.  EMS noted that her legs were swollen bilaterally.  In the ER, patient is able to state her first and last name.  She knows that she is at [facility].  She is unable to state the year.  She is visibly air hungry and states that her daughter Heidi [name] called EMS.  Patient is unable to provide any additional history at this time.  Level 5 caveat secondary to altered mental status.  The history is provided by medical records and the EMS personnel. The history is limited by the condition of the patient. No language interpreter was used.       Past Medical History:  Diagnosis Date  . Adenomatous colon polyp   . Anemia   . Anxiety   . Asthma   . CHF (congestive heart failure) ([place])   . COPD (chronic obstructive pulmonary disease) ([place])   . Diabetes mellitus without complication ([place])   . High cholesterol   . Hypertension   . Left knee injury   . Pneumonia     Patient Active Problem List   Diagnosis Date Noted  . Pneumonia due to COVID-19 virus 08/10/2020  . Encephalopathy due to COVID-19 virus 08/10/2020  . CAP (community acquired pneumonia) 08/10/2020  . Acute respiratory failure with hypoxia and hypercapnia ([place]) 08/10/2020  . Acute kidney injury superimposed on CKD ([place]) 08/10/2020  . Encounter for medication review and counseling 03/18/2020  . Medication management  02/28/2020  . Exertional dyspnea 01/03/2020  . Multifocal pneumonia 11/24/2019  . Acute respiratory failure ([place]) 11/24/2019  . Breast nodule 11/24/2019  . Chronic respiratory failure with hypoxia, on home oxygen therapy ([place]) 11/13/2019  . Atypical chest pain 08/24/2019  . Cervical myelopathy ([place]) 08/16/2018  . Diabetic retinopathy of right eye associated with type 2 diabetes mellitus ([place]) 08/16/2018  . Upper airway cough syndrome 05/03/2018  . Morbid obesity due to excess calories ([place]) complicated by hbp/ dm / hyperlipidemia 09/10/2017  . Cough variant asthma vs UACS/vcd on ACEi  09/09/2017  . Anxiety and depression 09/06/2017  . Mild persistent asthma without complication [id number]  . Paranoia ([place]) 09/06/2017  . Torn left ear lobe 11/20/2016  . Chronic migraine w/o aura w/o status migrainosus, not intractable 06/27/2016  . Post-menopausal atrophic vaginitis 11/14/2015  . Primary osteoarthritis of both knees 07/18/2015  . GERD (gastroesophageal reflux disease) 05/08/2015  . Renal cyst 05/08/2015  . Adenomatous polyp of colon 03/26/2015  . Essential hypertension 02/12/2015  . Cognitive deficit due to old head trauma 02/12/2015  . Asthma, chronic 02/12/2015  . Chronic left shoulder pain 02/11/2015  . Thyroid nodule 12/18/2014  . Chronic neck pain   . HLD (hyperlipidemia)   . OSA treated with BiPAP 11/27/2014  . Obesity hypoventilation syndrome ([place]) 11/27/2014  . Demand ischemia ([place])   . Uncontrolled type 2 diabetes mellitus with hyperglycemia ([place])   . Cardiomegaly 11/25/2014  .  CHF (congestive heart failure) ([place]) 11/25/2014  . DM type 2 causing CKD stage 2 ([place]) 11/25/2014    Past Surgical History:  Procedure Laterality Date  . ABDOMINAL HYSTERECTOMY    . CATARACT EXTRACTION Left   . CATARACT EXTRACTION     LT 01/2017, 02/2017 on RT  . CESAREAN SECTION       OB History   No obstetric history on file.     Family History  Problem Relation Age of Onset  .  Colon cancer Father   . Diabetes type II Sister   . Diabetes type II Brother   . Colon cancer Sister   . Migraines Neg Hx   . Breast cancer Neg Hx     Social History   Tobacco Use  . Smoking status: Never Smoker  . Smokeless tobacco: Never Used  Vaping Use  . Vaping Use: Never used  Substance Use Topics  . Alcohol use: No  . Drug use: No    Home Medications Prior to Admission medications   Medication Sig Start Date End Date Taking? Authorizing Provider  Accu-Chek FastClix Lancets MISC Use as directed to test blood sugar three times daily DX E11.65 04/10/20   Ladell Pier, MD  albuterol (PROVENTIL) (2.5 MG/3ML) 0.083% nebulizer solution USE 1 VIAL VIA NEBULIZER EVERY 6 HOURS AS NEEDED FOR WHEEZING OR SHORTNESS OF BREATH Patient taking differently: Take 2.5 mg by nebulization every 6 (six) hours as needed for wheezing or shortness of breath. 02/21/18   Ladell Pier, MD  albuterol (VENTOLIN HFA) 108 (90 Base) MCG/ACT inhaler Inhale 1-2 puffs into the lungs every 4 (four) hours as needed for wheezing or shortness of breath. 08/01/20   Tanda Rockers, MD  aspirin 81 MG chewable tablet Chew 81 mg by mouth daily.     [provider]  atenolol (TENORMIN) 25 MG tablet Take 1 tablet (25 mg total) by mouth daily. 08/30/19   Argentina Donovan, PA-C  atorvastatin (LIPITOR) 40 MG tablet TAKE 1 TABLET BY MOUTH DAILY AT 6 PM Patient taking differently: Take 40 mg by mouth every evening. TAKE 1 TABLET BY MOUTH DAILY AT 6 PM 06/28/20   Ladell Pier, MD  azithromycin (ZITHROMAX Z-PAK) 250 MG tablet Please take 2 tablets orally on day 1 then 1 tablet orally daily for days 2 through 5 08/01/20   Lamptey, Myrene Galas, MD  benzonatate (TESSALON) 100 MG capsule Take 1 capsule (100 mg total) by mouth every 8 (eight) hours. 08/01/20   Chase Picket, MD  Blood Glucose Monitoring Suppl (ACCU-CHEK GUIDE ME) w/Device KIT Use as directed to test blood sugar three times daily DX E11.65  04/10/20   Ladell Pier, MD  famotidine (PEPCID) 20 MG tablet Take 1 tablet (20 mg total) by mouth 2 (two) times daily. 05/15/20   Ladell Pier, MD  fluticasone-salmeterol (ADVAIR HFA) [id number] MCG/ACT inhaler Inhale 2 puffs into the lungs 2 (two) times daily. 08/01/20   LampteyMyrene Galas, MD  furosemide (LASIX) 40 MG tablet Take 1 tablet (40 mg total) by mouth daily. 06/06/20   Ladell Pier, MD  glucose blood (ACCU-CHEK GUIDE) test strip Use as directed to test blood sugar three times daily DX E11.65 04/10/20   Ladell Pier, MD  guaiFENesin (MUCINEX) 600 MG 12 hr tablet Take 1 tablet (600 mg total) by mouth 2 (two) times daily for 10 days. 08/01/20 08/11/20  Chase Picket, MD  insulin aspart protamine- aspart (NOVOLOG MIX 70/30) (  70-30) 100 UNIT/ML injection Inject 0.45-0.7 mLs (45-70 Units total) into the skin See admin instructions. Inject 70 units in the morning and 45 units in the evening. 12/04/19   Bonnielee Haff, MD  INSULIN SYRINGE 1CC/29G 29G X 1/2" 1 ML MISC USE THREE TIMES DAILY AS DIRECTED 07/19/20   Ladell Pier, MD  loratadine (CLARITIN) 10 MG tablet Take 10 mg by mouth daily as needed for allergies.    [provider]  losartan (COZAAR) 50 MG tablet Take 1 tablet (50 mg total) by mouth daily. 08/30/19   Argentina Donovan, PA-C  montelukast (SINGULAIR) 10 MG tablet Take 1 tablet (10 mg total) by mouth at bedtime. 03/18/20   Tanda Rockers, MD  Multiple Vitamin (MULTIVITAMIN WITH MINERALS) TABS tablet Take 1 tablet by mouth daily.    [provider]  nitroGLYCERIN (NITROSTAT) 0.3 MG SL tablet Place 1 tablet (0.3 mg total) under the tongue every 5 (five) minutes as needed for chest pain. Max 3 tablets in 15 minutes 08/30/19   Freeman Caldron M, PA-C  olopatadine (PATANOL) 0.1 % ophthalmic solution Place 1 drop into both eyes 2 (two) times daily. 08/26/19   Asencion Noble, MD  pantoprazole (PROTONIX) 40 MG tablet TAKE 1 TABLET(40 MG) BY MOUTH  DAILY Patient taking differently: Take 40 mg by mouth daily. 03/30/20   Ladell Pier, MD  Potassium Chloride ER 20 MEQ TBCR TAKE 1 TABLET BY MOUTH DAILY Patient taking differently: Take 20 mEq by mouth daily. 01/30/20   Ladell Pier, MD  tetrahydrozoline-zinc (VISINE-AC) 0.05-0.25 % ophthalmic solution Place 1 drop into both eyes 3 (three) times daily as needed.    [provider]  topiramate (TOPAMAX) 25 MG capsule TAKE 3 CAPSULES(75 MG) BY MOUTH TWICE DAILY Patient taking differently: Take 75 mg by mouth 2 (two) times daily. 05/10/20   Ladell Pier, MD  traMADol (ULTRAM) 50 MG tablet Take 1 tablet (50 mg total) by mouth every 8 (eight) hours as needed (Prescription must last 1 month). 06/08/20   Ladell Pier, MD  Fexofenadine HCl [facility] ALLERGY PO) Take 1 tablet by mouth daily as needed (for allergy).  08/01/20  [provider]    Allergies    Januvia [sitagliptin]  Review of Systems   Review of Systems  Unable to perform ROS: Mental status change    Physical Exam Updated Vital Signs BP (!) 132/57   Pulse (!) 105   Temp (!) 101.8 F (38.8 C) (Oral)   Resp (!) 33   Ht _0  (1.626 m)   Wt 107.5 kg   SpO2 93%   BMI 40.68 kg/m   Physical Exam Vitals and nursing note reviewed.  Constitutional:      General: She is not in acute distress.    Appearance: She is obese. She is not toxic-appearing or diaphoretic.     Interventions: Nasal cannula in place.  HENT:     Head: Normocephalic.     Nose: Nose normal.     Mouth/Throat:     Mouth: Mucous membranes are moist.     Pharynx: No oropharyngeal exudate or posterior oropharyngeal erythema.  Eyes:     Conjunctiva/sclera: Conjunctivae normal.  Cardiovascular:     Rate and Rhythm: Regular rhythm. Tachycardia present.     Pulses: Normal pulses.     Heart sounds: Normal heart sounds. No murmur heard. No friction rub. No gallop.   Pulmonary:     Effort: Respiratory distress present.  Breath sounds: No stridor. No wheezing, rhonchi or rales.     Comments: Tachypneic.  Lungs are diminished throughout.  No adventitious breath sounds. Chest:     Chest wall: No tenderness.  Abdominal:     General: There is no distension.     Palpations: Abdomen is soft. There is no mass.     Tenderness: There is no abdominal tenderness. There is no right CVA tenderness, left CVA tenderness, guarding or rebound.     Hernia: No hernia is present.  Musculoskeletal:     Cervical back: Neck supple.     Comments: Trace peripheral edema bilaterally  Skin:    General: Skin is warm.     Coloration: Skin is not jaundiced or pale.     Findings: No rash.  Neurological:     Mental Status: She is alert.     Comments: Drowsy, but alert.  Oriented to self and place.  She is unable to state the year.  Responses are very slowed.  She will intermittently not answer questions.  Speech is not slurred.   Psychiatric:        Behavior: Behavior normal.     ED Results / Procedures / Treatments   Labs (all labs ordered are listed, but only abnormal results are displayed) Labs Reviewed  LACTIC ACID, PLASMA - Abnormal; Notable for the following components:      Result Value   Lactic Acid, Venous 2.1 (*)    All other components within normal limits  COMPREHENSIVE METABOLIC PANEL - Abnormal; Notable for the following components:   Chloride 94 (*)    Glucose, Bld 379 (*)    BUN 28 (*)    Creatinine, Ser 1.43 (*)    Albumin 3.4 (*)    AST 42 (*)    GFR, Estimated 40 (*)    All other components within normal limits  D-DIMER, QUANTITATIVE (NOT AT [facility]) - Abnormal; Notable for the following components:   D-Dimer, Quant 1.33 (*)    All other components within normal limits  LACTATE DEHYDROGENASE - Abnormal; Notable for the following components:   LDH 378 (*)    All other components within normal limits  FIBRINOGEN - Abnormal; Notable for the following components:   Fibrinogen 653 (*)    All other  components within normal limits  C-REACTIVE PROTEIN - Abnormal; Notable for the following components:   CRP 12.1 (*)    All other components within normal limits  I-STAT VENOUS BLOOD GAS, ED - Abnormal; Notable for the following components:   pO2, Ven 54.0 (*)    Bicarbonate 31.9 (*)    TCO2 34 (*)    Acid-Base Excess 5.0 (*)    Calcium, Ion 1.12 (*)    All other components within normal limits  I-STAT ARTERIAL BLOOD GAS, ED - Abnormal; Notable for the following components:   pH, Arterial 7.234 (*)    pCO2 arterial 84.6 (*)    Bicarbonate 35.0 (*)    TCO2 37 (*)    Acid-Base Excess 5.0 (*)    All other components within normal limits  CBG MONITORING, ED - Abnormal; Notable for the following components:   Glucose-Capillary 338 (*)    All other components within normal limits  I-STAT ARTERIAL BLOOD GAS, ED - Abnormal; Notable for the following components:   pH, Arterial 7.236 (*)    pCO2 arterial 83.7 (*)    pO2, Arterial 68 (*)    Bicarbonate 35.5 (*)    TCO2 38 (*)  Acid-Base Excess 5.0 (*)    All other components within normal limits  CULTURE, BLOOD (ROUTINE X 2)  CULTURE, BLOOD (ROUTINE X 2)  PROCALCITONIN  FERRITIN  TRIGLYCERIDES  CBC WITH DIFFERENTIAL/PLATELET  MAGNESIUM  CBC WITH DIFFERENTIAL/PLATELET  BETA-HYDROXYBUTYRIC ACID  BLOOD GAS, ARTERIAL  HEMOGLOBIN A1C  CBC  CREATININE, SERUM    EKG None  Radiology CT Head Wo Contrast  Result Date: 08/10/2020 CLINICAL DATA:  Mental status changes.  Coronavirus infection. EXAM: CT HEAD WITHOUT CONTRAST TECHNIQUE: Contiguous axial images were obtained from the base of the skull through the vertex without intravenous contrast. COMPARISON:  01/27/2018 FINDINGS: Brain: No evidence of accelerated atrophy. No evidence of acute infarction, intra-axial mass lesion, hemorrhage, hydrocephalus or extra-axial collection 1 cm calcified meningioma at the right frontoparietal convexity, not significant. Vascular: Question a  hyperdense MCA branch on the left in the sylvian fissure region. This is not a definite finding. Skull: Normal Sinuses/Orbits: Clear Other: None IMPRESSION: 1. No acute brain parenchymal finding by CT. Question a hyperdense MCA branch on the left in the Sylvian fissure region. This is not a definite finding. If there is clinical concern about a left MCA territory acute infarction, consider CT angiography. 2. 1 cm calcified meningioma at the right frontoparietal convexity, not significant. Electronically Signed   By: Nelson Chimes M.D.   On: 08/10/2020 02:52   CT Angio Chest PE W and/or Wo Contrast  Result Date: 08/10/2020 CLINICAL DATA:  Coronavirus pneumonia.  Question pulmonary emboli. EXAM: CT ANGIOGRAPHY CHEST WITH CONTRAST TECHNIQUE: Multidetector CT imaging of the chest was performed using the standard protocol during bolus administration of intravenous contrast. Multiplanar CT image reconstructions and MIPs were obtained to evaluate the vascular anatomy. CONTRAST:  60m OMNIPAQUE IOHEXOL 350 MG/ML SOLN COMPARISON:  Chest radiography yesterday. FINDINGS: Cardiovascular: Cardiomegaly. No pericardial fluid. Some coronary artery calcification. No visible thoracic aortic atherosclerotic calcification. Pulmonary arterial opacification is moderate. There are no large pulmonary emboli. Small peripheral emboli might be inapparent, but none are seen. Mediastinum/Nodes: No mass or lymphadenopathy. Lungs/Pleura: No pleural effusion. Widespread patchy pulmonary infiltrates consistent with the clinical history of viral pneumonia. Fairly dense consolidation in the left lower lobe. There could possibly be superimposed bacterial pneumonia. Upper Abdomen: Left renal cyst.  Not completely evaluated. Musculoskeletal: Negative Review of the MIP images confirms the above findings. IMPRESSION: 1. Pulmonary arterial opacification is moderate. No large pulmonary emboli. Small peripheral emboli might be inapparent, but none are  seen. 2. Cardiomegaly. Some coronary artery calcification. 3. Widespread patchy pulmonary infiltrates consistent with the clinical history of viral pneumonia. Fairly dense consolidation in the left lower lobe could possibly be superimposed bacterial pneumonia. Electronically Signed   By: MNelson ChimesM.D.   On: 08/10/2020 01:48   DG Chest Port 1 View  Result Date: 08/09/2020 CLINICAL DATA:  70year old female with shortness of breath and cough. Positive COVID-19 EXAM: PORTABLE CHEST 1 VIEW COMPARISON:  Chest radiograph dated 08/01/2020 FINDINGS: Bilateral confluent and streaky airspace opacities consistent with multilobar pneumonia and in keeping with COVID-19. No pleural effusion pneumothorax. Stable cardiomegaly. No acute osseous pathology. IMPRESSION: Multilobar pneumonia. Electronically Signed   By: AAnner CreteM.D.   On: 08/09/2020 22:57    Procedures .Critical Care Performed by: MJoanne Gavel PA-C Authorized by: MJoanne Gavel PA-C   Critical care provider statement:    Critical care time (minutes):  60   Critical care time was exclusive of:  Separately billable procedures and treating other patients and teaching time  Critical care was necessary to treat or prevent imminent or life-threatening deterioration of the following conditions:  Respiratory failure and sepsis   Critical care was time spent personally by me on the following activities:  Ordering and performing treatments and interventions, ordering and review of laboratory studies, ordering and review of radiographic studies, pulse oximetry, re-evaluation of patient's condition, review of old charts, discussions with consultants, obtaining history from patient or surrogate, examination of patient, evaluation of patient's response to treatment and development of treatment plan with patient or surrogate   I assumed direction of critical care for this patient from another provider in my specialty: no     Care discussed with:  admitting provider     Care discussed with comment:  Critical Care and Hospitalist     Medications Ordered in ED Medications  aerochamber plus with mask device 1 each (has no administration in time range)  aspirin chewable tablet 81 mg (has no administration in time range)  atenolol (TENORMIN) tablet 25 mg (has no administration in time range)  atorvastatin (LIPITOR) tablet 40 mg (has no administration in time range)  losartan (COZAAR) tablet 50 mg (has no administration in time range)  albuterol (VENTOLIN HFA) 108 (90 Base) MCG/ACT inhaler 1-2 puff (has no administration in time range)  mometasone-formoterol (DULERA) 200-5 MCG/ACT inhaler 2 puff (has no administration in time range)  insulin aspart (novoLOG) injection 0-15 Units (has no administration in time range)  insulin aspart (novoLOG) injection 0-5 Units (has no administration in time range)  enoxaparin (LOVENOX) injection 40 mg (has no administration in time range)  lactated ringers infusion (has no administration in time range)  methylPREDNISolone sodium succinate (SOLU-MEDROL) 125 mg/2 mL injection 107.5 mg (has no administration in time range)    Followed by  predniSONE (DELTASONE) tablet 50 mg (has no administration in time range)  guaiFENesin-dextromethorphan (ROBITUSSIN DM) 100-10 MG/5ML syrup 10 mL (has no administration in time range)  acetaminophen (TYLENOL) tablet 650 mg (has no administration in time range)  cefTRIAXone (ROCEPHIN) 2 g in sodium chloride 0.9 % 100 mL IVPB (has no administration in time range)  azithromycin (ZITHROMAX) 250 mg in dextrose 5 % 125 mL IVPB (has no administration in time range)  furosemide (LASIX) injection 40 mg (has no administration in time range)  albuterol (VENTOLIN HFA) 108 (90 Base) MCG/ACT inhaler 8 puff (8 puffs Inhalation Given 08/10/20 0035)  acetaminophen (TYLENOL) tablet 650 mg (650 mg Oral Given 08/10/20 0036)  methylPREDNISolone sodium succinate (SOLU-MEDROL) 125 mg/2 mL injection  125 mg (125 mg Intravenous Given 08/10/20 0034)  sodium chloride 0.9 % bolus 500 mL (0 mLs Intravenous Stopped 08/10/20 0134)  magnesium sulfate IVPB 2 g 50 mL (0 g Intravenous Stopped 08/10/20 0133)  iohexol (OMNIPAQUE) 350 MG/ML injection 80 mL (80 mLs Intravenous Contrast Given 08/10/20 0134)  cefTRIAXone (ROCEPHIN) 1 g in sodium chloride 0.9 % 100 mL IVPB (0 g Intravenous Stopped 08/10/20 0405)  albuterol (VENTOLIN HFA) 108 (90 Base) MCG/ACT inhaler 8 puff (8 puffs Inhalation Given 08/10/20 0420)  ipratropium (ATROVENT HFA) inhaler 3 puff (3 puffs Inhalation Given 08/10/20 0433)    ED Course  I have reviewed the triage vital signs and the nursing notes.  Pertinent labs & imaging results that were available during my care of the patient were reviewed by me and considered in my medical decision making (see chart for details).  Clinical Course as of 08/10/20 0530  Sat Aug 10, 2020  0152 Patient recheck.  Mentation has intervally improved.  She still is not able to tell me the year.  She does ask me if her blood sugar is high and if she has had her home medications.  I will ask her multiple questions that she does not answer.  She will make eye contact me with a moment and then stare out to the room without answering questions. [MM]  0230 Went to evaluate the patient and follow-up on ABG collection, which was pending.  Patient's mentation was improving as she was now asking how her sugar was and if she had received her home medications.  She did continue to appear drowsy, but was much improved from previous.  However, shortly after evaluating the patient, ABG returned demonstrating hypercapnia.  Discussed with respiratory and Salter was decreased to 6 L/min.  We will plan to repeat ABG in 1 hour. [MM]  [phone] Spoke with Dr. Vallarie Mare her, hospitalist service, who will accept the patient for admission. [MM]  0449 Repeat ABG with persistent hypercapnia.  However, stable from previous.  Patient was given more albuterol  and Atrovent after discussing the patient with Dr. Leonette Monarch, attending physician.  However, mentation has not improved.  Patient has been started on BiPAP is that she is Covid positive, I spoke with critical care.  Dr. Kyung Rudd will come and evaluate the patient in the ER.  Dr. Tonie Griffith with the hospitalist team has been made aware. [MM]    Clinical Course User Index [MM] McDonald, Laymond Purser, PA-C   MDM Rules/Calculators/A&P                          70 year old female with history of chronic respiratory failure on 2 L home O2, COPD, chronic diastolic congestive heart failure, CKD stage III, OSA/obesity hypoventilation syndrome not on CPAP presenting from home with altered mental status.  Patient tested positive for COVID-19 on January 27.  Patient does wear 2 L of home O2 at baseline, but was brought in on nonrebreather and transition to 4 L nasal cannula in the ER.  Patient is drowsy, but alert with very slowed responses.  She is oriented to name and place, but is unable to state the year.  This does appear to be a change from baseline when reviewing her medical record.  I spoke with the patient's son who lives out of state, but I been unable to get a hold of the patient's daughter who contacted EMS today.  The patient was seen and independently evaluated by Dr. Leonette Monarch, attending physician.  He is in agreement with work-up and plan.  Febrile, tachycardic, and tachypneic on arrival.  She is normotensive.  On exam, she is tachypneic.  Lungs are clear to auscultation bilaterally, but diminished throughout.  Labs and imaging have been reviewed and independently interpreted by me.  Given her history of Covid with worsening oxygen requirement, COVID-19 preadmission labs were ordered.  However, labs are only mildly elevated.  LDH 378.  D-dimer 1.33.  Fibrinogen is 653.  Ferritin level was normal.  However, since she is Covid positive, could consider Covid encephalopathy as the etiology of her altered mental  status.  However, given her increased work of breathing, there is concern for COPD versus asthma exacerbation given her past medical history.  She was given Solu-Medrol, magnesium sulfate, and albuterol.  Respiratory therapy has evaluated the patient at bedside and transition the patient to high flow salter as she was having hypoxia down to 79% with minimal movement in the bed.  Because of her worsening hypoxia in the setting of COVID-19, CT PE study was obtained.  No evidence of PE, but she does have extensive viral pneumonia throughout her CT.  There is a questionable dense consolidation in the left lower lung lobe that could be superimposed bacterial pneumonia, but less likely given procalcitonin level.  Glucose is elevated at 379, but patient does have a normal bicarb level and anion gap.  Doubt DKA or HHS at this time as the etiology of her altered mental status.  Head CT was obtained, which demonstrated a questionable hyperdense MCA branch on the left in the sylvian fissure region, which is not a definitive finding.  However, if there is clinical concern about a left MCA territory acute infarct, consider CT angiography.  Unfortunately, patient is already had CT contrast today for PE study.  Will order MRI brain, which is pending.  Although patient's mentation did initially improve, she still has some confusion.  ABG obtained by RT with acidosis of 7.234 and new hypercarbia at 84.6.  RT has decreased the patient's [place] to see if hypercarbia improved since initially patient was not hypercarbic.  Dr. Leonette Monarch was in agreement with this plan.  We will repeat ABG in 1 hour.  Repeat ABG with persistent hypercarbia.  In the interim, I have spoken with Dr. Tonie Griffith, hospitalist, who accepted the patient for admission, but given persistent hypercarbia with no improvement after repeat Atrovent and albuterol, patient was started on BiPAP.  Spoke with Dr. Lamonte Sakai, critical care, who will come and see the and  evaluate the patient at bedside.  Critical care will accept the patient for admission.  The patient is critically ill and will require admission.  The patient appears reasonably stabilized for admission considering the current resources, flow, and capabilities available in the ED at this time, and I doubt any other [facility] requiring further screening and/or treatment in the ED prior to admission.   Final Clinical Impression(s) / ED Diagnoses Final diagnoses:  COVID-19  Hyperglycemia due to diabetes mellitus ([place])  AKI (acute kidney injury) ([place])  Encephalopathy  Acute respiratory failure with hypoxia and hypercarbia [facility])    Rx / DC Orders ED Discharge Orders    None       Joanne Gavel, PA-C 08/10/20 0530    Fatima Blank, MD 08/11/20 [phone]

## 2020-08-10 ENCOUNTER — Emergency Department (HOSPITAL_COMMUNITY): Payer: Medicare HMO

## 2020-08-10 ENCOUNTER — Inpatient Hospital Stay (HOSPITAL_COMMUNITY): Payer: Medicare HMO

## 2020-08-10 ENCOUNTER — Other Ambulatory Visit: Payer: Self-pay

## 2020-08-10 ENCOUNTER — Encounter (HOSPITAL_COMMUNITY): Payer: Self-pay | Admitting: Family Medicine

## 2020-08-10 DIAGNOSIS — G9349 Other encephalopathy: Secondary | ICD-10-CM

## 2020-08-10 DIAGNOSIS — J9602 Acute respiratory failure with hypercapnia: Secondary | ICD-10-CM | POA: Diagnosis not present

## 2020-08-10 DIAGNOSIS — I130 Hypertensive heart and chronic kidney disease with heart failure and stage 1 through stage 4 chronic kidney disease, or unspecified chronic kidney disease: Secondary | ICD-10-CM | POA: Diagnosis not present

## 2020-08-10 DIAGNOSIS — N179 Acute kidney failure, unspecified: Secondary | ICD-10-CM

## 2020-08-10 DIAGNOSIS — F419 Anxiety disorder, unspecified: Secondary | ICD-10-CM | POA: Diagnosis not present

## 2020-08-10 DIAGNOSIS — I5032 Chronic diastolic (congestive) heart failure: Secondary | ICD-10-CM | POA: Diagnosis not present

## 2020-08-10 DIAGNOSIS — E11319 Type 2 diabetes mellitus with unspecified diabetic retinopathy without macular edema: Secondary | ICD-10-CM | POA: Diagnosis not present

## 2020-08-10 DIAGNOSIS — Z7982 Long term (current) use of aspirin: Secondary | ICD-10-CM | POA: Diagnosis not present

## 2020-08-10 DIAGNOSIS — R059 Cough, unspecified: Secondary | ICD-10-CM | POA: Diagnosis not present

## 2020-08-10 DIAGNOSIS — G9341 Metabolic encephalopathy: Secondary | ICD-10-CM | POA: Diagnosis not present

## 2020-08-10 DIAGNOSIS — N189 Chronic kidney disease, unspecified: Secondary | ICD-10-CM | POA: Diagnosis not present

## 2020-08-10 DIAGNOSIS — G458 Other transient cerebral ischemic attacks and related syndromes: Secondary | ICD-10-CM | POA: Diagnosis not present

## 2020-08-10 DIAGNOSIS — Z6841 Body Mass Index (BMI) 40.0 and over, adult: Secondary | ICD-10-CM | POA: Diagnosis not present

## 2020-08-10 DIAGNOSIS — J440 Chronic obstructive pulmonary disease with acute lower respiratory infection: Secondary | ICD-10-CM | POA: Diagnosis not present

## 2020-08-10 DIAGNOSIS — Z833 Family history of diabetes mellitus: Secondary | ICD-10-CM | POA: Diagnosis not present

## 2020-08-10 DIAGNOSIS — Z79899 Other long term (current) drug therapy: Secondary | ICD-10-CM | POA: Diagnosis not present

## 2020-08-10 DIAGNOSIS — R4182 Altered mental status, unspecified: Secondary | ICD-10-CM | POA: Diagnosis not present

## 2020-08-10 DIAGNOSIS — Z800 Family history of malignant neoplasm of digestive organs: Secondary | ICD-10-CM | POA: Diagnosis not present

## 2020-08-10 DIAGNOSIS — E1165 Type 2 diabetes mellitus with hyperglycemia: Secondary | ICD-10-CM | POA: Diagnosis not present

## 2020-08-10 DIAGNOSIS — E870 Hyperosmolality and hypernatremia: Secondary | ICD-10-CM | POA: Diagnosis not present

## 2020-08-10 DIAGNOSIS — J1282 Pneumonia due to coronavirus disease 2019: Secondary | ICD-10-CM | POA: Diagnosis present

## 2020-08-10 DIAGNOSIS — J9601 Acute respiratory failure with hypoxia: Secondary | ICD-10-CM

## 2020-08-10 DIAGNOSIS — E785 Hyperlipidemia, unspecified: Secondary | ICD-10-CM | POA: Diagnosis not present

## 2020-08-10 DIAGNOSIS — R0902 Hypoxemia: Secondary | ICD-10-CM | POA: Diagnosis not present

## 2020-08-10 DIAGNOSIS — J9621 Acute and chronic respiratory failure with hypoxia: Secondary | ICD-10-CM | POA: Diagnosis not present

## 2020-08-10 DIAGNOSIS — Z794 Long term (current) use of insulin: Secondary | ICD-10-CM | POA: Diagnosis not present

## 2020-08-10 DIAGNOSIS — U071 COVID-19: Secondary | ICD-10-CM | POA: Diagnosis not present

## 2020-08-10 DIAGNOSIS — E041 Nontoxic single thyroid nodule: Secondary | ICD-10-CM | POA: Diagnosis not present

## 2020-08-10 DIAGNOSIS — I5033 Acute on chronic diastolic (congestive) heart failure: Secondary | ICD-10-CM | POA: Diagnosis not present

## 2020-08-10 DIAGNOSIS — I6782 Cerebral ischemia: Secondary | ICD-10-CM | POA: Diagnosis not present

## 2020-08-10 DIAGNOSIS — I878 Other specified disorders of veins: Secondary | ICD-10-CM | POA: Diagnosis not present

## 2020-08-10 DIAGNOSIS — E872 Acidosis: Secondary | ICD-10-CM | POA: Diagnosis not present

## 2020-08-10 DIAGNOSIS — R110 Nausea: Secondary | ICD-10-CM | POA: Diagnosis not present

## 2020-08-10 DIAGNOSIS — J189 Pneumonia, unspecified organism: Secondary | ICD-10-CM

## 2020-08-10 DIAGNOSIS — G4733 Obstructive sleep apnea (adult) (pediatric): Secondary | ICD-10-CM | POA: Diagnosis not present

## 2020-08-10 DIAGNOSIS — N1832 Chronic kidney disease, stage 3b: Secondary | ICD-10-CM | POA: Diagnosis not present

## 2020-08-10 DIAGNOSIS — R0602 Shortness of breath: Secondary | ICD-10-CM | POA: Diagnosis not present

## 2020-08-10 DIAGNOSIS — J9622 Acute and chronic respiratory failure with hypercapnia: Secondary | ICD-10-CM | POA: Diagnosis not present

## 2020-08-10 LAB — CBG MONITORING, ED
Glucose-Capillary: 338 mg/dL — ABNORMAL HIGH (ref 70–99)
Glucose-Capillary: 501 mg/dL (ref 70–99)
Glucose-Capillary: 488 mg/dL — ABNORMAL HIGH (ref 70–99)
Glucose-Capillary: 465 mg/dL — ABNORMAL HIGH (ref 70–99)
Glucose-Capillary: 390 mg/dL — ABNORMAL HIGH (ref 70–99)
Glucose-Capillary: 335 mg/dL — ABNORMAL HIGH (ref 70–99)
Glucose-Capillary: 200 mg/dL — ABNORMAL HIGH (ref 70–99)

## 2020-08-10 LAB — I-STAT ARTERIAL BLOOD GAS, ED
Acid-Base Excess: 5 mmol/L — ABNORMAL HIGH (ref 0.0–2.0)
Acid-Base Excess: 5 mmol/L — ABNORMAL HIGH (ref 0.0–2.0)
Acid-Base Excess: 4 mmol/L — ABNORMAL HIGH (ref 0.0–2.0)
Bicarbonate: 35 mmol/L — ABNORMAL HIGH (ref 20.0–28.0)
Bicarbonate: 35.5 mmol/L — ABNORMAL HIGH (ref 20.0–28.0)
Bicarbonate: 33.9 mmol/L — ABNORMAL HIGH (ref 20.0–28.0)
Calcium, Ion: 1.22 mmol/L (ref 1.15–1.40)
Calcium, Ion: 1.24 mmol/L (ref 1.15–1.40)
Calcium, Ion: 1.22 mmol/L (ref 1.15–1.40)
HCT: 38 % (ref 36.0–46.0)
HCT: 39 % (ref 36.0–46.0)
HCT: 39 % (ref 36.0–46.0)
Hemoglobin: 12.9 g/dL (ref 12.0–15.0)
Hemoglobin: 13.3 g/dL (ref 12.0–15.0)
Hemoglobin: 13.3 g/dL (ref 12.0–15.0)
O2 Saturation: 95 %
O2 Saturation: 88 %
O2 Saturation: 93 %
Patient temperature: 101.8
Patient temperature: 98.8
Patient temperature: 98
Potassium: 3.7 mmol/L (ref 3.5–5.1)
Potassium: 4 mmol/L (ref 3.5–5.1)
Potassium: 4.4 mmol/L (ref 3.5–5.1)
Sodium: 137 mmol/L (ref 135–145)
Sodium: 137 mmol/L (ref 135–145)
Sodium: 137 mmol/L (ref 135–145)
TCO2: 37 mmol/L — ABNORMAL HIGH (ref 22–32)
TCO2: 38 mmol/L — ABNORMAL HIGH (ref 22–32)
TCO2: 36 mmol/L — ABNORMAL HIGH (ref 22–32)
pCO2 arterial: 84.6 mmHg (ref 32.0–48.0)
pCO2 arterial: 83.7 mmHg (ref 32.0–48.0)
pCO2 arterial: 74.4 mmHg (ref 32.0–48.0)
pH, Arterial: 7.234 — ABNORMAL LOW (ref 7.350–7.450)
pH, Arterial: 7.236 — ABNORMAL LOW (ref 7.350–7.450)
pH, Arterial: 7.265 — ABNORMAL LOW (ref 7.350–7.450)
pO2, Arterial: 104 mmHg (ref 83.0–108.0)
pO2, Arterial: 68 mmHg — ABNORMAL LOW (ref 83.0–108.0)
pO2, Arterial: 79 mmHg — ABNORMAL LOW (ref 83.0–108.0)

## 2020-08-10 LAB — PROCALCITONIN: Procalcitonin: <0.1 ng/mL

## 2020-08-10 LAB — LACTIC ACID, PLASMA: Lactic Acid, Venous: 2.1 mmol/L (ref 0.5–1.9)

## 2020-08-10 LAB — BETA-HYDROXYBUTYRIC ACID: Beta-Hydroxybutyric Acid: 0.38 mmol/L — ABNORMAL HIGH (ref 0.05–0.27)

## 2020-08-10 LAB — FERRITIN: Ferritin: 129 ng/mL (ref 11–307)

## 2020-08-10 LAB — MAGNESIUM: Magnesium: 2.6 mg/dL — ABNORMAL HIGH (ref 1.7–2.4)

## 2020-08-10 LAB — D-DIMER, QUANTITATIVE: D-Dimer, Quant: 1.33 ug/mL-FEU — ABNORMAL HIGH (ref 0.00–0.50)

## 2020-08-10 LAB — C-REACTIVE PROTEIN: CRP: 12.1 mg/dL — ABNORMAL HIGH (ref <1.0)

## 2020-08-10 LAB — FIBRINOGEN: Fibrinogen: 653 mg/dL — ABNORMAL HIGH (ref 210–475)

## 2020-08-10 MED ORDER — TOCILIZUMAB 400 MG/20ML IV SOLN
800.0000 mg | Freq: Once | INTRAVENOUS | Status: AC
  Administered 2020-08-10: 800 mg via INTRAVENOUS
  Filled 2020-08-10: qty 40

## 2020-08-10 MED ORDER — ATENOLOL 50 MG PO TABS
25.0000 mg | ORAL_TABLET | Freq: Every day | ORAL | Status: DC
  Administered 2020-08-10 – 2020-08-19 (×10): 25 mg via ORAL
  Filled 2020-08-10 (×10): qty 1

## 2020-08-10 MED ORDER — INSULIN ASPART 100 UNIT/ML ~~LOC~~ SOLN
0.0000 [IU] | Freq: Every day | SUBCUTANEOUS | Status: DC
  Administered 2020-08-11: 5 [IU] via SUBCUTANEOUS
  Administered 2020-08-15: 3 [IU] via SUBCUTANEOUS
  Administered 2020-08-16: 2 [IU] via SUBCUTANEOUS
  Administered 2020-08-18: 4 [IU] via SUBCUTANEOUS

## 2020-08-10 MED ORDER — LOSARTAN POTASSIUM 50 MG PO TABS
50.0000 mg | ORAL_TABLET | Freq: Every day | ORAL | Status: DC
  Administered 2020-08-10 – 2020-08-12 (×3): 50 mg via ORAL
  Filled 2020-08-10 (×3): qty 1

## 2020-08-10 MED ORDER — ACETAMINOPHEN 325 MG PO TABS
650.0000 mg | ORAL_TABLET | Freq: Four times a day (QID) | ORAL | Status: DC | PRN
  Administered 2020-08-11 – 2020-08-14 (×4): 650 mg via ORAL
  Filled 2020-08-10 (×4): qty 2

## 2020-08-10 MED ORDER — SODIUM CHLORIDE 0.9 % IV SOLN: 2.0000 g | INTRAVENOUS | Status: DC

## 2020-08-10 MED ORDER — ENOXAPARIN SODIUM 40 MG/0.4ML ~~LOC~~ SOLN
40.0000 mg | SUBCUTANEOUS | Status: DC
  Administered 2020-08-10: 40 mg via SUBCUTANEOUS
  Filled 2020-08-10: qty 0.4

## 2020-08-10 MED ORDER — DEXTROSE 5 % IV SOLN
250.0000 mg | INTRAVENOUS | Status: DC
  Administered 2020-08-10: 250 mg via INTRAVENOUS
  Filled 2020-08-10: qty 250

## 2020-08-10 MED ORDER — IPRATROPIUM BROMIDE HFA 17 MCG/ACT IN AERS
3.0000 | INHALATION_SPRAY | Freq: Once | RESPIRATORY_TRACT | Status: AC
  Administered 2020-08-10: 3 via RESPIRATORY_TRACT
  Filled 2020-08-10: qty 12.9

## 2020-08-10 MED ORDER — LACTATED RINGERS IV SOLN: INTRAVENOUS | Status: DC

## 2020-08-10 MED ORDER — SODIUM CHLORIDE 0.9 % IV SOLN
1.0000 g | Freq: Once | INTRAVENOUS | Status: AC
  Administered 2020-08-10: 1 g via INTRAVENOUS
  Filled 2020-08-10: qty 10

## 2020-08-10 MED ORDER — INSULIN ASPART PROT & ASPART (70-30 MIX) 100 UNIT/ML ~~LOC~~ SUSP: 45.0000 [IU] | SUBCUTANEOUS | Status: DC

## 2020-08-10 MED ORDER — SODIUM CHLORIDE 0.9 % IV BOLUS
500.0000 mL | Freq: Once | INTRAVENOUS | Status: AC
  Administered 2020-08-10: 500 mL via INTRAVENOUS

## 2020-08-10 MED ORDER — IOHEXOL 350 MG/ML SOLN
80.0000 mL | Freq: Once | INTRAVENOUS | Status: AC | PRN
  Administered 2020-08-10: 80 mL via INTRAVENOUS

## 2020-08-10 MED ORDER — PREDNISONE 5 MG PO TABS: 50.0000 mg | ORAL_TABLET | Freq: Every day | ORAL | Status: DC

## 2020-08-10 MED ORDER — SODIUM CHLORIDE 0.9 % IV SOLN: 100.0000 mg | Freq: Two times a day (BID) | INTRAVENOUS | Status: DC

## 2020-08-10 MED ORDER — INSULIN ASPART PROT & ASPART (70-30 MIX) 100 UNIT/ML ~~LOC~~ SUSP
50.0000 [IU] | Freq: Two times a day (BID) | SUBCUTANEOUS | Status: DC
  Administered 2020-08-10 – 2020-08-17 (×14): 50 [IU] via SUBCUTANEOUS
  Filled 2020-08-10 (×2): qty 10

## 2020-08-10 MED ORDER — HYDROCOD POLST-CPM POLST ER 10-8 MG/5ML PO SUER: 5.0000 mL | Freq: Two times a day (BID) | ORAL | Status: DC | PRN

## 2020-08-10 MED ORDER — MOMETASONE FURO-FORMOTEROL FUM 200-5 MCG/ACT IN AERO
2.0000 | INHALATION_SPRAY | Freq: Two times a day (BID) | RESPIRATORY_TRACT | Status: DC
  Administered 2020-08-10 – 2020-08-19 (×19): 2 via RESPIRATORY_TRACT
  Filled 2020-08-10: qty 8.8

## 2020-08-10 MED ORDER — INSULIN ASPART 100 UNIT/ML ~~LOC~~ SOLN
0.0000 [IU] | Freq: Three times a day (TID) | SUBCUTANEOUS | Status: DC
  Administered 2020-08-10 (×2): 20 [IU] via SUBCUTANEOUS
  Administered 2020-08-10: 11 [IU] via SUBCUTANEOUS
  Administered 2020-08-11: 2 [IU] via SUBCUTANEOUS
  Administered 2020-08-11: 8 [IU] via SUBCUTANEOUS
  Administered 2020-08-11 – 2020-08-12 (×2): 3 [IU] via SUBCUTANEOUS
  Administered 2020-08-12: 5 [IU] via SUBCUTANEOUS
  Administered 2020-08-12 – 2020-08-13 (×2): 3 [IU] via SUBCUTANEOUS
  Administered 2020-08-13 – 2020-08-14 (×2): 2 [IU] via SUBCUTANEOUS
  Administered 2020-08-14 (×2): 8 [IU] via SUBCUTANEOUS
  Administered 2020-08-15: 5 [IU] via SUBCUTANEOUS
  Administered 2020-08-15: 2 [IU] via SUBCUTANEOUS
  Administered 2020-08-15: 5 [IU] via SUBCUTANEOUS
  Administered 2020-08-16: 8 [IU] via SUBCUTANEOUS
  Administered 2020-08-16 – 2020-08-17 (×3): 3 [IU] via SUBCUTANEOUS
  Administered 2020-08-17: 5 [IU] via SUBCUTANEOUS
  Administered 2020-08-17 – 2020-08-18 (×2): 3 [IU] via SUBCUTANEOUS
  Administered 2020-08-19: 15 [IU] via SUBCUTANEOUS

## 2020-08-10 MED ORDER — INSULIN ASPART 100 UNIT/ML ~~LOC~~ SOLN: 10.0000 [IU] | Freq: Once | SUBCUTANEOUS | Status: DC

## 2020-08-10 MED ORDER — ALBUTEROL SULFATE HFA 108 (90 BASE) MCG/ACT IN AERS
8.0000 | INHALATION_SPRAY | Freq: Once | RESPIRATORY_TRACT | Status: AC
  Administered 2020-08-10: 8 via RESPIRATORY_TRACT

## 2020-08-10 MED ORDER — ALBUTEROL SULFATE HFA 108 (90 BASE) MCG/ACT IN AERS
1.0000 | INHALATION_SPRAY | RESPIRATORY_TRACT | Status: DC | PRN
  Administered 2020-08-11 – 2020-08-17 (×4): 2 via RESPIRATORY_TRACT
  Filled 2020-08-10: qty 6.7

## 2020-08-10 MED ORDER — FUROSEMIDE 10 MG/ML IJ SOLN
40.0000 mg | Freq: Once | INTRAMUSCULAR | Status: AC
  Administered 2020-08-10: 40 mg via INTRAVENOUS
  Filled 2020-08-10: qty 4

## 2020-08-10 MED ORDER — ASPIRIN 81 MG PO CHEW
81.0000 mg | CHEWABLE_TABLET | Freq: Every day | ORAL | Status: DC
  Administered 2020-08-10 – 2020-08-19 (×10): 81 mg via ORAL
  Filled 2020-08-10 (×10): qty 1

## 2020-08-10 MED ORDER — INSULIN ASPART 100 UNIT/ML ~~LOC~~ SOLN
10.0000 [IU] | SUBCUTANEOUS | Status: DC
  Administered 2020-08-10 – 2020-08-11 (×4): 10 [IU] via SUBCUTANEOUS

## 2020-08-10 MED ORDER — ATORVASTATIN CALCIUM 40 MG PO TABS
40.0000 mg | ORAL_TABLET | Freq: Every evening | ORAL | Status: DC
  Administered 2020-08-10 – 2020-08-18 (×9): 40 mg via ORAL
  Filled 2020-08-10 (×9): qty 1

## 2020-08-10 MED ORDER — METHYLPREDNISOLONE SODIUM SUCC 125 MG IJ SOLR
1.0000 mg/kg | Freq: Two times a day (BID) | INTRAMUSCULAR | Status: DC
  Administered 2020-08-10 – 2020-08-12 (×5): 107.5 mg via INTRAVENOUS
  Filled 2020-08-10 (×5): qty 2

## 2020-08-10 MED ORDER — FUROSEMIDE 20 MG PO TABS: 40.0000 mg | ORAL_TABLET | Freq: Every day | ORAL | Status: DC

## 2020-08-10 MED ORDER — ENOXAPARIN SODIUM 60 MG/0.6ML ~~LOC~~ SOLN
50.0000 mg | SUBCUTANEOUS | Status: DC
  Administered 2020-08-10 – 2020-08-18 (×9): 50 mg via SUBCUTANEOUS
  Filled 2020-08-10 (×7): qty 0.6
  Filled 2020-08-10 (×2): qty 0.5
  Filled 2020-08-10: qty 0.6

## 2020-08-10 MED ORDER — MAGNESIUM SULFATE 2 GM/50ML IV SOLN
2.0000 g | Freq: Once | INTRAVENOUS | Status: AC
  Administered 2020-08-10: 2 g via INTRAVENOUS
  Filled 2020-08-10: qty 50

## 2020-08-10 MED ORDER — GUAIFENESIN-DM 100-10 MG/5ML PO SYRP
10.0000 mL | ORAL_SOLUTION | ORAL | Status: DC | PRN
  Administered 2020-08-11 – 2020-08-17 (×3): 10 mL via ORAL
  Filled 2020-08-10 (×3): qty 10

## 2020-08-10 MED ORDER — INSULIN ASPART 100 UNIT/ML ~~LOC~~ SOLN
20.0000 [IU] | Freq: Once | SUBCUTANEOUS | Status: AC
  Administered 2020-08-10: 20 [IU] via SUBCUTANEOUS

## 2020-08-10 MED ORDER — SODIUM CHLORIDE 0.9 % IV SOLN
100.0000 mg | Freq: Every day | INTRAVENOUS | Status: AC
  Administered 2020-08-11 – 2020-08-14 (×4): 100 mg via INTRAVENOUS
  Filled 2020-08-10 (×4): qty 20

## 2020-08-10 MED ORDER — SODIUM CHLORIDE 0.9 % IV SOLN
200.0000 mg | Freq: Once | INTRAVENOUS | Status: AC
  Administered 2020-08-10: 200 mg via INTRAVENOUS
  Filled 2020-08-10: qty 200

## 2020-08-10 NOTE — H&P (Signed)
History and Physical    Heidi [name] [id number] DOB: May 25, 1951 DOA: 08/09/2020  PCP: Ladell Pier, MD   Patient coming from:  Home  Chief Complaint: altered mental status  HPI: Heidi [name] is a 70 y.o. female with medical history significant for chronic respiratory failure on 2 L home O2, COPD, chronic diastolic congestive heart failure, CKD stage III, OSA/obesity hypoventilation syndrome not on CPAP who presents by EMS for altered mental status. The patient tested positive for COVID-19 on January 27. She is unable to provide any history. She is lethargic but opens eyes to tactile stimulation but does not answer questions. No family at bedside. She has had hypoxia in the Er and is on oxygen. ABG shows hypercapnia and will be reassessed shortly with repeat ABG. If hypercapnia does not improve will need Bipap therapy.  Pt had Covid pneumonia on chest CTA as well as a superimposed LLL infiltrate consistent with bacterial infection.  Pt was started on antibiotic therapy in ER.   Review of Systems:  Review of system cannot be obtained secondary to encephalopathy  Past Medical History:  Diagnosis Date  . Adenomatous colon polyp   . Anemia   . Anxiety   . Asthma   . CHF (congestive heart failure) ([place])   . COPD (chronic obstructive pulmonary disease) ([place])   . Diabetes mellitus without complication ([place])   . High cholesterol   . Hypertension   . Left knee injury   . Pneumonia     Past Surgical History:  Procedure Laterality Date  . ABDOMINAL HYSTERECTOMY    . CATARACT EXTRACTION Left   . CATARACT EXTRACTION     LT 01/2017, 02/2017 on RT  . CESAREAN SECTION      Social History  reports that she has never smoked. She has never used smokeless tobacco. She reports that she does not drink alcohol and does not use drugs.  Allergies  Allergen Reactions  . Januvia [Sitagliptin]     Chest pains    Family History  Problem Relation Age of Onset  . Colon cancer  Father   . Diabetes type II Sister   . Diabetes type II Brother   . Colon cancer Sister   . Migraines Neg Hx   . Breast cancer Neg Hx      Prior to Admission medications   Medication Sig Start Date End Date Taking? Authorizing Provider  Accu-Chek FastClix Lancets MISC Use as directed to test blood sugar three times daily DX E11.65 04/10/20   Ladell Pier, MD  albuterol (PROVENTIL) (2.5 MG/3ML) 0.083% nebulizer solution USE 1 VIAL VIA NEBULIZER EVERY 6 HOURS AS NEEDED FOR WHEEZING OR SHORTNESS OF BREATH Patient taking differently: Take 2.5 mg by nebulization every 6 (six) hours as needed for wheezing or shortness of breath. 02/21/18   Ladell Pier, MD  albuterol (VENTOLIN HFA) 108 (90 Base) MCG/ACT inhaler Inhale 1-2 puffs into the lungs every 4 (four) hours as needed for wheezing or shortness of breath. 08/01/20   Tanda Rockers, MD  aspirin 81 MG chewable tablet Chew 81 mg by mouth daily.     [provider]  atenolol (TENORMIN) 25 MG tablet Take 1 tablet (25 mg total) by mouth daily. 08/30/19   Argentina Donovan, PA-C  atorvastatin (LIPITOR) 40 MG tablet TAKE 1 TABLET BY MOUTH DAILY AT 6 PM Patient taking differently: Take 40 mg by mouth every evening. TAKE 1 TABLET BY MOUTH DAILY AT 6 PM 06/28/20  Ladell Pier, MD  azithromycin (ZITHROMAX Z-PAK) 250 MG tablet Please take 2 tablets orally on day 1 then 1 tablet orally daily for days 2 through 5 08/01/20   Lamptey, Myrene Galas, MD  benzonatate (TESSALON) 100 MG capsule Take 1 capsule (100 mg total) by mouth every 8 (eight) hours. 08/01/20   Chase Picket, MD  Blood Glucose Monitoring Suppl (ACCU-CHEK GUIDE ME) w/Device KIT Use as directed to test blood sugar three times daily DX E11.65 04/10/20   Ladell Pier, MD  famotidine (PEPCID) 20 MG tablet Take 1 tablet (20 mg total) by mouth 2 (two) times daily. 05/15/20   Ladell Pier, MD  fluticasone-salmeterol (ADVAIR HFA) [phone] MCG/ACT inhaler Inhale 2 puffs  into the lungs 2 (two) times daily. 08/01/20   LampteyMyrene Galas, MD  furosemide (LASIX) 40 MG tablet Take 1 tablet (40 mg total) by mouth daily. 06/06/20   Ladell Pier, MD  glucose blood (ACCU-CHEK GUIDE) test strip Use as directed to test blood sugar three times daily DX E11.65 04/10/20   Ladell Pier, MD  guaiFENesin (MUCINEX) 600 MG 12 hr tablet Take 1 tablet (600 mg total) by mouth 2 (two) times daily for 10 days. 08/01/20 08/11/20  Chase Picket, MD  insulin aspart protamine- aspart (NOVOLOG MIX 70/30) (70-30) 100 UNIT/ML injection Inject 0.45-0.7 mLs (45-70 Units total) into the skin See admin instructions. Inject 70 units in the morning and 45 units in the evening. 12/04/19   Bonnielee Haff, MD  INSULIN SYRINGE 1CC/29G 29G X 1/2" 1 ML MISC USE THREE TIMES DAILY AS DIRECTED 07/19/20   Ladell Pier, MD  loratadine (CLARITIN) 10 MG tablet Take 10 mg by mouth daily as needed for allergies.    [provider]  losartan (COZAAR) 50 MG tablet Take 1 tablet (50 mg total) by mouth daily. 08/30/19   Argentina Donovan, PA-C  montelukast (SINGULAIR) 10 MG tablet Take 1 tablet (10 mg total) by mouth at bedtime. 03/18/20   Tanda Rockers, MD  Multiple Vitamin (MULTIVITAMIN WITH MINERALS) TABS tablet Take 1 tablet by mouth daily.    [provider]  nitroGLYCERIN (NITROSTAT) 0.3 MG SL tablet Place 1 tablet (0.3 mg total) under the tongue every 5 (five) minutes as needed for chest pain. Max 3 tablets in 15 minutes 08/30/19   Freeman Caldron M, PA-C  olopatadine (PATANOL) 0.1 % ophthalmic solution Place 1 drop into both eyes 2 (two) times daily. 08/26/19   Asencion Noble, MD  pantoprazole (PROTONIX) 40 MG tablet TAKE 1 TABLET(40 MG) BY MOUTH DAILY Patient taking differently: Take 40 mg by mouth daily. 03/30/20   Ladell Pier, MD  Potassium Chloride ER 20 MEQ TBCR TAKE 1 TABLET BY MOUTH DAILY Patient taking differently: Take 20 mEq by mouth daily. 01/30/20   Ladell Pier, MD  tetrahydrozoline-zinc (VISINE-AC) 0.05-0.25 % ophthalmic solution Place 1 drop into both eyes 3 (three) times daily as needed.    [provider]  topiramate (TOPAMAX) 25 MG capsule TAKE 3 CAPSULES(75 MG) BY MOUTH TWICE DAILY Patient taking differently: Take 75 mg by mouth 2 (two) times daily. 05/10/20   Ladell Pier, MD  traMADol (ULTRAM) 50 MG tablet Take 1 tablet (50 mg total) by mouth every 8 (eight) hours as needed (Prescription must last 1 month). 06/08/20   Ladell Pier, MD  Fexofenadine HCl [facility] ALLERGY PO) Take 1 tablet by mouth daily as needed (for allergy).  08/01/20  [provider]    Physical Exam: Vitals:   08/10/20 0100 08/10/20 0115 08/10/20 0237 08/10/20 0317  BP: 135/67 91/62  123/62  Pulse: (!) 105 (!) 106  99  Resp: (!) 31 (!) 36  (!) 31  Temp:      TempSrc:      SpO2: 90% 94% 92% 95%  Weight:      Height:        Constitutional: NAD, calm, comfortable Vitals:   08/10/20 0100 08/10/20 0115 08/10/20 0237 08/10/20 0317  BP: 135/67 91/62  123/62  Pulse: (!) 105 (!) 106  99  Resp: (!) 31 (!) 36  (!) 31  Temp:      TempSrc:      SpO2: 90% 94% 92% 95%  Weight:      Height:       General: Obese AA female. Lethargic, does not respond verbally. Opens eyes to tactile stimulation. Eyes: PERRL, conjunctivae normal.  Sclera nonicteric HENT:  [place]/AT, external ears normal.  Nares patent without epistasis.  Mucous membranes are dry. Posterior pharynx clear of any exudate or lesions.  Neck: Soft, normal range of motion, supple, no masses, no thyromegaly. Trachea midline Respiratory:  Breath sounds diminished. Bibasilar rales. no wheezing, no crackles. Normal respiratory effort.   Cardiovascular: Regular rate and rhythm, no murmurs / rubs / gallops. No extremity edema. 1+ pedal pulses. Abdomen: Soft, no tenderness, nondistended, no rebound or guarding. Obese. No masses palpated. Bowel sounds normoactive Musculoskeletal: Normal  passive range of motion. No cyanosis. No joint deformity upper and lower extremities.  Normal muscle tone.  Skin: Warm, dry, intact no rashes, lesions, ulcers. No induration Neurologic: patella DTR +1 bilaterally. Withdraws from painful stimuli.    Labs on Admission: I have personally reviewed following labs and imaging studies  CBC: Recent Labs  Lab 08/09/20 2328 08/10/20 0226 08/10/20 0403  HGB 14.3 12.9 13.3  HCT 42.0 38.0 59.5    Basic Metabolic Panel: Recent Labs  Lab 08/09/20 2315 08/09/20 2328 08/10/20 0226 08/10/20 0403  NA 136 137 137 137  K 3.8 3.8 3.7 4.0  CL 94*  --   --   --   CO2 29  --   --   --   GLUCOSE 379*  --   --   --   BUN 28*  --   --   --   CREATININE 1.43*  --   --   --   CALCIUM 9.1  --   --   --     GFR: Estimated Creatinine Clearance: 45.1 mL/min (A) (by C-G formula based on SCr of 1.43 mg/dL (H)).  Liver Function Tests: Recent Labs  Lab 08/09/20 2315  AST 42*  ALT 28  ALKPHOS 65  BILITOT 0.4  PROT 6.9  ALBUMIN 3.4*    Urine analysis:    Component Value Date/Time   COLORURINE YELLOW 11/27/2019 0115   APPEARANCEUR CLOUDY (A) 11/27/2019 0115   LABSPEC 1.020 11/27/2019 0115   PHURINE 7.0 11/27/2019 0115   GLUCOSEU 150 (A) 11/27/2019 0115   HGBUR NEGATIVE 11/27/2019 0115   BILIRUBINUR NEGATIVE 11/27/2019 0115   BILIRUBINUR Negative 05/12/2016 1703   KETONESUR NEGATIVE 11/27/2019 0115   PROTEINUR NEGATIVE 11/27/2019 0115   UROBILINOGEN 0.2 04/25/2019 1508   NITRITE NEGATIVE 11/27/2019 0115   LEUKOCYTESUR NEGATIVE 11/27/2019 0115    Radiological Exams on Admission: CT Head Wo Contrast  Result Date: 08/10/2020 CLINICAL DATA:  Mental status changes.  Coronavirus infection. EXAM: CT HEAD WITHOUT CONTRAST  TECHNIQUE: Contiguous axial images were obtained from the base of the skull through the vertex without intravenous contrast. COMPARISON:  01/27/2018 FINDINGS: Brain: No evidence of accelerated atrophy. No evidence of acute  infarction, intra-axial mass lesion, hemorrhage, hydrocephalus or extra-axial collection 1 cm calcified meningioma at the right frontoparietal convexity, not significant. Vascular: Question a hyperdense MCA branch on the left in the sylvian fissure region. This is not a definite finding. Skull: Normal Sinuses/Orbits: Clear Other: None IMPRESSION: 1. No acute brain parenchymal finding by CT. Question a hyperdense MCA branch on the left in the Sylvian fissure region. This is not a definite finding. If there is clinical concern about a left MCA territory acute infarction, consider CT angiography. 2. 1 cm calcified meningioma at the right frontoparietal convexity, not significant. Electronically Signed   By: Nelson Chimes M.D.   On: 08/10/2020 02:52   CT Angio Chest PE W and/or Wo Contrast  Result Date: 08/10/2020 CLINICAL DATA:  Coronavirus pneumonia.  Question pulmonary emboli. EXAM: CT ANGIOGRAPHY CHEST WITH CONTRAST TECHNIQUE: Multidetector CT imaging of the chest was performed using the standard protocol during bolus administration of intravenous contrast. Multiplanar CT image reconstructions and MIPs were obtained to evaluate the vascular anatomy. CONTRAST:  59m OMNIPAQUE IOHEXOL 350 MG/ML SOLN COMPARISON:  Chest radiography yesterday. FINDINGS: Cardiovascular: Cardiomegaly. No pericardial fluid. Some coronary artery calcification. No visible thoracic aortic atherosclerotic calcification. Pulmonary arterial opacification is moderate. There are no large pulmonary emboli. Small peripheral emboli might be inapparent, but none are seen. Mediastinum/Nodes: No mass or lymphadenopathy. Lungs/Pleura: No pleural effusion. Widespread patchy pulmonary infiltrates consistent with the clinical history of viral pneumonia. Fairly dense consolidation in the left lower lobe. There could possibly be superimposed bacterial pneumonia. Upper Abdomen: Left renal cyst.  Not completely evaluated. Musculoskeletal: Negative Review of  the MIP images confirms the above findings. IMPRESSION: 1. Pulmonary arterial opacification is moderate. No large pulmonary emboli. Small peripheral emboli might be inapparent, but none are seen. 2. Cardiomegaly. Some coronary artery calcification. 3. Widespread patchy pulmonary infiltrates consistent with the clinical history of viral pneumonia. Fairly dense consolidation in the left lower lobe could possibly be superimposed bacterial pneumonia. Electronically Signed   By: MNelson ChimesM.D.   On: 08/10/2020 01:48   DG Chest Port 1 View  Result Date: 08/09/2020 CLINICAL DATA:  70year old female with shortness of breath and cough. Positive COVID-19 EXAM: PORTABLE CHEST 1 VIEW COMPARISON:  Chest radiograph dated 08/01/2020 FINDINGS: Bilateral confluent and streaky airspace opacities consistent with multilobar pneumonia and in keeping with COVID-19. No pleural effusion pneumothorax. Stable cardiomegaly. No acute osseous pathology. IMPRESSION: Multilobar pneumonia. Electronically Signed   By: AAnner CreteM.D.   On: 08/09/2020 22:57    Assessment/Plan Principal Problem:   Encephalopathy due to COVID-19 virus Ms. BLukasikis admitted to progressive care unit.  MRI of brain has been ordered for further evaluation for possible acute CVA and abnormal CT Head finding. MRI is pending  Active Problems:   Pneumonia due to COVID-19 virus Remdisivir is not initiated as pt is 10 days from positive Covid test  Supplemental oxygen provided keep O2 sat between 92 to 96% Albuterol MDI every 4 hours as needed for wheezing cough, shortness of breath Antitussives provided Incentive spirometer every 1-2 hours while awake Monitor Covid inflammatory labs daily.      CAP (community acquired pneumonia) Superimposed bacterial infiltrate on CT chest in LLL. Started on antibiotics with rocephin and doxycycline    Uncontrolled type 2 diabetes mellitus with hyperglycemia  Monitor blood sugar q 6 hours while NPO. Check  HgbA1c SSI as needed for glycemic control. Will resume basal insulin when pt can tolerate diet.     Essential hypertension Continue home antihypertensive medications of atenolol, losartan. Monitor BP    Acute respiratory failure with hypoxia and hypercapnia  Supplemental oxygen to keep O2 sat between 92-96%.  Incentive spirometer every 1-2 hours while awake.  RT to follow.     Acute kidney injury superimposed on CKD IVF hydration with LR at 75 ml/hr.  Recheck electrolytes and renal function in am.     COPD Dulera with spacer substituted for advair. Continue albuterol MDI with spacer as needed    DVT prophylaxis: Lovenox for DVT prophylaxis Code Status:   Full Code  Family Communication:  No family at bedside. No answer when attempted to call family.  Disposition Plan:   Patient is from:  Home  Anticipated DC to:  Home  Anticipated DC date:  Anticipate more than 2 midnight stay to treat acute condition  Admission status:  Inpatient  Yevonne Aline Chotiner MD [facility]  How to contact the [facility] Attending or Consulting provider [place] or covering provider during after hours [place], for this patient?   1. Check the care team in [facility] and look for a) attending/consulting [facility] provider listed and b) the [facility] team listed 2. Log into [email or web address] and use [place]'s universal password to access. If you do not have the password, please contact the hospital operator. 3. Locate the [facility] provider you are looking for under [facility] and page to a number that you can be directly reached. 4. If you still have difficulty reaching the provider, please page the [facility] (Director on Call) for the Hospitalists listed on amion for assistance.  08/10/2020, 4:13 AM

## 2020-08-10 NOTE — ED Notes (Signed)
Bipap removed by RT.

## 2020-08-10 NOTE — ED Notes (Signed)
Pt desated to 81%, RT consulted, pt placed on 15lpm high flow [place], and 15lpm [place]

## 2020-08-10 NOTE — ED Notes (Signed)
Pt sweating  Cooler than earlier . Axillary temp 98.o  Bed sheet changed pure wick placed  Iv re-taped  Tape loose from her perspiration

## 2020-08-10 NOTE — ED Notes (Signed)
Patient transported to MRI 

## 2020-08-10 NOTE — ED Notes (Signed)
Assisted cleaning pt and changed pts bed.

## 2020-08-10 NOTE — ED Notes (Signed)
The pt is resting  She is due for a mri this am  She was just placed on bi-pap when they called to take her to mri  She may be taken off bi-pap a little later this am

## 2020-08-10 NOTE — Consult Note (Signed)
NAME:  Heidi [name], MRN:  [id number], DOB:  October 01, 1950, LOS: 0 ADMISSION DATE:  08/09/2020, CONSULTATION DATE:  2/5 REFERRING MD:  [facility], CHIEF COMPLAINT:  Acute respiratory failure    Brief History:  70yo female with hx chronic respiratory failure on 2L home O2, COPD, chronic dCHF, CKDIII, OSA not compliant with CPAP who presented 2/4 with AMS.  Tested POS for COVID 1/27.  W/u revealed acute on chronic hypercarbic respiratory failure and she was placed on bipap.  PCCM consulted for potential ICU admission.   History of Present Illness:  70yo female with hx chronic respiratory failure on 2L home O2, COPD, chronic dCHF, CKDIII, OSA not compliant with CPAP who presented 2/4 with AMS.  Tested POS for COVID 1/27.  W/u revealed acute on chronic hypercarbic respiratory failure and she was placed on bipap.  PCCM consulted for potential ICU admission.   Past Medical History:   has a past medical history of Adenomatous colon polyp, Anemia, Anxiety, Asthma, CHF (congestive heart failure) ([place]), COPD (chronic obstructive pulmonary disease) ([place]), Diabetes mellitus without complication ([place]), High cholesterol, Hypertension, Left knee injury, and Pneumonia.   Significant Hospital Events:    Consults:    Procedures:    Significant Diagnostic Tests:  CT head 2/5>>>neg acute  CTA chest 2/5>>>1. Pulmonary arterial opacification is moderate. No large pulmonary emboli. Small peripheral emboli might be inapparent, but none are Seen. 2. Cardiomegaly. Some coronary artery calcification. 3. Widespread patchy pulmonary infiltrates consistent with the clinical history of viral pneumonia. Fairly dense consolidation in the left lower lobe could possibly be superimposed bacterial pneumonia  Micro Data:  BC x 2 2/5>>>  Antimicrobials:  Ceftriaxone 2/5>>> Azithro 2/5>>>   Interim History / Subjective:  Now on bipap in ER, tolerating well.   Objective   Blood pressure (!) 132/57, pulse (!) 105,  temperature (!) 101.8 F (38.8 C), temperature source Oral, resp. rate (!) 33, height 5' 4"  (1.626 m), weight 107.5 kg, SpO2 93 %.    Vent Mode: BIPAP;PCV FiO2 (%):  [100 %] 100 % Set Rate:  [16 bmp] 16 bmp PEEP:  [5 cmH20] 5 cmH20  No intake or output data in the 24 hours ending 08/10/20 0523 Filed Weights   08/09/20 2246  Weight: 107.5 kg    Examination: General: chronically ill appearing female, NAD on bipap  HENT: mm moist, bipap mask  Lungs: resps even non labored on bipap, bibasilar crackles  Cardiovascular: s1s2 rrr Abdomen: soft Extremities: warm and dry Neuro: somnolent on bipap, follows some commands briefly  Resolved Hospital Problem list     Assessment & Plan:  Acute on chronic hypercarbic and hypoxic respiratory failure  COVID PNA superimposed on CAP COPD  PLAN -  Continue bipap   F/u ABG  dulera Lasix 14m IV x 1  follow intermittent CXR  solumdedrol 1-245mkg for 3-5 days then taper to off pending O2 response Outside window for remdesivir  Consider Baricitinib - will hold off for now given ?bacterial infection   DM  PLAN -  SSI   Hx HTN  PLAN -  Continue home atenolol, losartan for now   AKI on CKDIII PLAN -  F/u chem   AMS - likely r/t hypercarbia  PLAN -  bipap as above  Avoid sedating medications    Best practice (evaluated daily)  Diet: npo Pain/Anxiety/Delirium protocol (if indicated): n/a VAP protocol (if indicated): n/a DVT prophylaxis: lovenox GI prophylaxis: n/a Glucose control: SSI Mobility: BR Disposition:ICU  Goals of Care:  Last date of multidisciplinary goals of care discussion: Family and staff present:  Summary of discussion:  Follow up goals of care discussion due: 2/12 Code Status: full  Labs   CBC: Recent Labs  Lab 08/09/20 2328 08/10/20 0226 08/10/20 0403  HGB 14.3 12.9 13.3  HCT 42.0 38.0 31.5    Basic Metabolic Panel: Recent Labs  Lab 08/09/20 2315 08/09/20 2328 08/10/20 0226 08/10/20 0403   NA 136 137 137 137  K 3.8 3.8 3.7 4.0  CL 94*  --   --   --   CO2 29  --   --   --   GLUCOSE 379*  --   --   --   BUN 28*  --   --   --   CREATININE 1.43*  --   --   --   CALCIUM 9.1  --   --   --    GFR: Estimated Creatinine Clearance: 45.1 mL/min (A) (by C-G formula based on SCr of 1.43 mg/dL (H)). Recent Labs  Lab 08/09/20 2315  PROCALCITON <0.10  LATICACIDVEN 2.1*    Liver Function Tests: Recent Labs  Lab 08/09/20 2315  AST 42*  ALT 28  ALKPHOS 65  BILITOT 0.4  PROT 6.9  ALBUMIN 3.4*   No results for input(s): LIPASE, AMYLASE in the last 168 hours. No results for input(s): AMMONIA in the last 168 hours.  ABG    Component Value Date/Time   PHART 7.236 (L) 08/10/2020 0403   PCO2ART 83.7 (HH) 08/10/2020 0403   PO2ART 68 (L) 08/10/2020 0403   HCO3 35.5 (H) 08/10/2020 0403   TCO2 38 (H) 08/10/2020 0403   O2SAT 88.0 08/10/2020 0403     Coagulation Profile: No results for input(s): INR, PROTIME in the last 168 hours.  Cardiac Enzymes: No results for input(s): CKTOTAL, CKMB, CKMBINDEX, TROPONINI in the last 168 hours.  HbA1C: HbA1c, POC (controlled diabetic range)  Date/Time Value Ref Range Status  05/14/2020 02:39 PM 9.9 (A) 0.0 - 7.0 % Final  05/03/2018 08:49 AM 10.0 (A) 0.0 - 7.0 % Final   Hgb A1c MFr Bld  Date/Time Value Ref Range Status  11/13/2019 04:06 PM 10.5 (H) 4.8 - 5.6 % Final    Comment:             Prediabetes: 5.7 - 6.4          Diabetes: >6.4          Glycemic control for adults with diabetes: <7.0   06/14/2019 11:28 AM 9.5 (H) 4.8 - 5.6 % Final    Comment:             Prediabetes: 5.7 - 6.4          Diabetes: >6.4          Glycemic control for adults with diabetes: <7.0     CBG: Recent Labs  Lab 08/10/20 0408  GLUCAP 338*    Review of Systems:   Unable, pt somnolent on bipap  Past Medical History:  She,  has a past medical history of Adenomatous colon polyp, Anemia, Anxiety, Asthma, CHF (congestive heart failure) ([place]),  COPD (chronic obstructive pulmonary disease) ([place]), Diabetes mellitus without complication ([place]), High cholesterol, Hypertension, Left knee injury, and Pneumonia.   Surgical History:   Past Surgical History:  Procedure Laterality Date  . ABDOMINAL HYSTERECTOMY    . CATARACT EXTRACTION Left   . CATARACT EXTRACTION     LT 01/2017, 02/2017 on RT  . CESAREAN SECTION  Social History:   reports that she has never smoked. She has never used smokeless tobacco. She reports that she does not drink alcohol and does not use drugs.   Family History:  Her family history includes Colon cancer in her father and sister; Diabetes type II in her brother and sister. There is no history of Migraines or Breast cancer.   Allergies Allergies  Allergen Reactions  . Januvia [Sitagliptin]     Chest pains     Home Medications  Prior to Admission medications   Medication Sig Start Date End Date Taking? Authorizing Provider  Accu-Chek FastClix Lancets MISC Use as directed to test blood sugar three times daily DX E11.65 04/10/20   Ladell Pier, MD  albuterol (PROVENTIL) (2.5 MG/3ML) 0.083% nebulizer solution USE 1 VIAL VIA NEBULIZER EVERY 6 HOURS AS NEEDED FOR WHEEZING OR SHORTNESS OF BREATH Patient taking differently: Take 2.5 mg by nebulization every 6 (six) hours as needed for wheezing or shortness of breath. 02/21/18   Ladell Pier, MD  albuterol (VENTOLIN HFA) 108 (90 Base) MCG/ACT inhaler Inhale 1-2 puffs into the lungs every 4 (four) hours as needed for wheezing or shortness of breath. 08/01/20   Tanda Rockers, MD  aspirin 81 MG chewable tablet Chew 81 mg by mouth daily.     [provider]  atenolol (TENORMIN) 25 MG tablet Take 1 tablet (25 mg total) by mouth daily. 08/30/19   Argentina Donovan, PA-C  atorvastatin (LIPITOR) 40 MG tablet TAKE 1 TABLET BY MOUTH DAILY AT 6 PM Patient taking differently: Take 40 mg by mouth every evening. TAKE 1 TABLET BY MOUTH DAILY AT 6 PM 06/28/20    Ladell Pier, MD  azithromycin (ZITHROMAX Z-PAK) 250 MG tablet Please take 2 tablets orally on day 1 then 1 tablet orally daily for days 2 through 5 08/01/20   Lamptey, Myrene Galas, MD  benzonatate (TESSALON) 100 MG capsule Take 1 capsule (100 mg total) by mouth every 8 (eight) hours. 08/01/20   Chase Picket, MD  Blood Glucose Monitoring Suppl (ACCU-CHEK GUIDE ME) w/Device KIT Use as directed to test blood sugar three times daily DX E11.65 04/10/20   Ladell Pier, MD  famotidine (PEPCID) 20 MG tablet Take 1 tablet (20 mg total) by mouth 2 (two) times daily. 05/15/20   Ladell Pier, MD  fluticasone-salmeterol (ADVAIR HFA) [phone] MCG/ACT inhaler Inhale 2 puffs into the lungs 2 (two) times daily. 08/01/20   LampteyMyrene Galas, MD  furosemide (LASIX) 40 MG tablet Take 1 tablet (40 mg total) by mouth daily. 06/06/20   Ladell Pier, MD  glucose blood (ACCU-CHEK GUIDE) test strip Use as directed to test blood sugar three times daily DX E11.65 04/10/20   Ladell Pier, MD  guaiFENesin (MUCINEX) 600 MG 12 hr tablet Take 1 tablet (600 mg total) by mouth 2 (two) times daily for 10 days. 08/01/20 08/11/20  Chase Picket, MD  insulin aspart protamine- aspart (NOVOLOG MIX 70/30) (70-30) 100 UNIT/ML injection Inject 0.45-0.7 mLs (45-70 Units total) into the skin See admin instructions. Inject 70 units in the morning and 45 units in the evening. 12/04/19   Bonnielee Haff, MD  INSULIN SYRINGE 1CC/29G 29G X 1/2" 1 ML MISC USE THREE TIMES DAILY AS DIRECTED 07/19/20   Ladell Pier, MD  loratadine (CLARITIN) 10 MG tablet Take 10 mg by mouth daily as needed for allergies.    [provider]  losartan (COZAAR) 50 MG tablet Take  1 tablet (50 mg total) by mouth daily. 08/30/19   Argentina Donovan, PA-C  montelukast (SINGULAIR) 10 MG tablet Take 1 tablet (10 mg total) by mouth at bedtime. 03/18/20   Tanda Rockers, MD  Multiple Vitamin (MULTIVITAMIN WITH MINERALS) TABS tablet Take 1 tablet  by mouth daily.    [provider]  nitroGLYCERIN (NITROSTAT) 0.3 MG SL tablet Place 1 tablet (0.3 mg total) under the tongue every 5 (five) minutes as needed for chest pain. Max 3 tablets in 15 minutes 08/30/19   Freeman Caldron M, PA-C  olopatadine (PATANOL) 0.1 % ophthalmic solution Place 1 drop into both eyes 2 (two) times daily. 08/26/19   Asencion Noble, MD  pantoprazole (PROTONIX) 40 MG tablet TAKE 1 TABLET(40 MG) BY MOUTH DAILY Patient taking differently: Take 40 mg by mouth daily. 03/30/20   Ladell Pier, MD  Potassium Chloride ER 20 MEQ TBCR TAKE 1 TABLET BY MOUTH DAILY Patient taking differently: Take 20 mEq by mouth daily. 01/30/20   Ladell Pier, MD  tetrahydrozoline-zinc (VISINE-AC) 0.05-0.25 % ophthalmic solution Place 1 drop into both eyes 3 (three) times daily as needed.    [provider]  topiramate (TOPAMAX) 25 MG capsule TAKE 3 CAPSULES(75 MG) BY MOUTH TWICE DAILY Patient taking differently: Take 75 mg by mouth 2 (two) times daily. 05/10/20   Ladell Pier, MD  traMADol (ULTRAM) 50 MG tablet Take 1 tablet (50 mg total) by mouth every 8 (eight) hours as needed (Prescription must last 1 month). 06/08/20   Ladell Pier, MD  Fexofenadine HCl [facility] ALLERGY PO) Take 1 tablet by mouth daily as needed (for allergy).  08/01/20  [provider]     Critical care time: 32 mins    Nickolas Madrid, NP Pulmonary/Critical Care Medicine  08/10/2020  5:23 AM

## 2020-08-10 NOTE — Progress Notes (Signed)
PROGRESS NOTE    Heidi [name]  JKK:[id number] DOB: 1951/06/05 DOA: 08/09/2020 PCP: Ladell Pier, MD   Brief Narrative:  Heidi [name] is a 70 y.o. female with medical history significant for chronic respiratory failure on 2 L home O2, COPD, chronic diastolic congestive heart failure, CKD stage III, OSA/obesity hypoventilation syndrome not on CPAP who presents by EMS for altered mental status. The patient tested positive for COVID-19 on January 27.  Patient reports back to the hospital for worsening mental status and respiratory status per family.   Assessment & Plan:   Principal Problem:   Encephalopathy due to COVID-19 virus Active Problems:   Uncontrolled type 2 diabetes mellitus with hyperglycemia (HCC)   Essential hypertension   Pneumonia due to COVID-19 virus   CAP (community acquired pneumonia)   Acute respiratory failure with hypoxia and hypercapnia (HCC)   Acute kidney injury superimposed on CKD ([place])   Acute metabolic encephalopathy in the setting of COVID-19 infection Acute hypercarbia/hypoxia Rule out secondary infections or polypharmacy - Mental status improving with increased oxygenation and respiratory support - Continue to hold any sedating home medications in the setting of possible polypharmacy - MRI unremarkable for acute findings - CT head shows no acute findings with questionable hyperdense MCA branch on the left  -Continue supportive care, weaned off BiPAP now as below  Acute on chronic hypoxic hypercarbic respiratory failure in the setting of COVID-19 pneumonia Rule out concurrent COPD exacerbation -Will initiate Remdesivir, steroids, Actemra 08/10/20 in setting of acutely worsening respiratory status and COVID-19 infection -Procalcitonin negative, unlikely bacterial infection at this point -D-dimer reassuringly low, CTA at intake unremarkable for any large pulmonary emboli -Wean oxygen appropriately, home oxygen use 2 L nasal cannula  around-the-clock Recent Labs    08/09/20 2315  DDIMER 1.33*  FERRITIN 129  LDH 378*  CRP 12.1*    Uncontrolled type 2 diabetes mellitus with hyperglycemia  - Resume home 7030 at 50u twice daily - Continue sliding scale insulin, glucose markedly uncontrolled today on steroids despite poor p.o. intake   Essential hypertension Continue home antihypertensive medications of atenolol, losartan. Monitor BP  Acute kidney injury superimposed on CKD Continue LR at 75 cc/h until able to tolerate p.o. appropriately  Follow morning labs   DVT prophylaxis:      Lovenox for DVT prophylaxis Code Status:              Full Code  Family Communication: None  Status is: Inpatient  Dispo: The patient is from: Home              Anticipated d/c is to: To be determined              Anticipated d/c date is: > 72 hours              Patient currently not medically stable for discharge  Consultants:   None  Procedures:   None  Antimicrobials:  Remdesivir  Subjective: Overnight patient improved drastically with supportive BiPAP, still quite somnolent and confused but able to answer questions more appropriately today than previously.  Denies nausea vomiting diarrhea constipation headache fevers or chills.  Objective: Vitals:   08/10/20 0645 08/10/20 0700 08/10/20 0715 08/10/20 0812  BP: 121/63 124/62 125/66   Pulse: (!) 102 (!) 101 94   Resp: (!) 38 (!) 38 (!) 32   Temp:      TempSrc:      SpO2: 97% 99% 99% 91%  Weight:  Height:       No intake or output data in the 24 hours ending 08/10/20 0836 Filed Weights   08/09/20 2246  Weight: 107.5 kg    Examination:  General exam: Appears calm and comfortable, somnolent but arousable answering questions somewhat appropriately Respiratory system: Clear to auscultation. Respiratory effort normal. Cardiovascular system: S1 & S2 heard, RRR. No JVD, murmurs, rubs, gallops or clicks. No pedal edema. Gastrointestinal system: Abdomen is  nondistended, soft and nontender. No organomegaly or masses felt. Normal bowel sounds heard. Central nervous system: Alert and oriented. No focal neurological deficits. Extremities: Symmetric 5 x 5 power. Skin: No rashes, lesions or ulcers  Data Reviewed: I have personally reviewed following labs and imaging studies  CBC: Recent Labs  Lab 08/09/20 2328 08/10/20 0226 08/10/20 0403 08/10/20 0618  HGB 14.3 12.9 13.3 13.3  HCT 42.0 38.0 39.0 26.9   Basic Metabolic Panel: Recent Labs  Lab 08/09/20 2315 08/09/20 2328 08/10/20 0226 08/10/20 0403 08/10/20 0500 08/10/20 0618  NA 136 137 137 137  --  137  K 3.8 3.8 3.7 4.0  --  4.4  CL 94*  --   --   --   --   --   CO2 29  --   --   --   --   --   GLUCOSE 379*  --   --   --   --   --   BUN 28*  --   --   --   --   --   CREATININE 1.43*  --   --   --   --   --   CALCIUM 9.1  --   --   --   --   --   MG  --   --   --   --  2.6*  --    GFR: Estimated Creatinine Clearance: 45.1 mL/min (A) (by C-G formula based on SCr of 1.43 mg/dL (H)). Liver Function Tests: Recent Labs  Lab 08/09/20 2315  AST 42*  ALT 28  ALKPHOS 65  BILITOT 0.4  PROT 6.9  ALBUMIN 3.4*   No results for input(s): LIPASE, AMYLASE in the last 168 hours. No results for input(s): AMMONIA in the last 168 hours. Coagulation Profile: No results for input(s): INR, PROTIME in the last 168 hours. Cardiac Enzymes: No results for input(s): CKTOTAL, CKMB, CKMBINDEX, TROPONINI in the last 168 hours. BNP (last 3 results) No results for input(s): PROBNP in the last 8760 hours. HbA1C: No results for input(s): HGBA1C in the last 72 hours. CBG: Recent Labs  Lab 08/10/20 0408  GLUCAP 338*   Lipid Profile: Recent Labs    08/10/20 0015  TRIG 141   Thyroid Function Tests: No results for input(s): TSH, T4TOTAL, FREET4, T3FREE, THYROIDAB in the last 72 hours. Anemia Panel: Recent Labs    08/09/20 2315  FERRITIN 129   Sepsis Labs: Recent Labs  Lab  08/09/20 2315  PROCALCITON <0.10  LATICACIDVEN 2.1*    Recent Results (from the past 240 hour(s))  SARS CORONAVIRUS 2 (TAT 6-24 HRS) Nasopharyngeal Nasopharyngeal Swab     Status: Abnormal   Collection Time: 08/01/20  2:41 PM   Specimen: Nasopharyngeal Swab  Result Value Ref Range Status   SARS Coronavirus 2 POSITIVE (A) NEGATIVE Final    Comment: (NOTE) SARS-CoV-2 target nucleic acids are DETECTED.  The SARS-CoV-2 RNA is generally detectable in upper and lower respiratory specimens during the acute phase of infection. Positive results are indicative of  the presence of SARS-CoV-2 RNA. Clinical correlation with patient history and other diagnostic information is  necessary to determine patient infection status. Positive results do not rule out bacterial infection or co-infection with other viruses.  The expected result is Negative.  Fact Sheet for Patients: SugarRoll.be  Fact Sheet for Healthcare Providers: https://www.woods-mathews.com/  This test is not yet approved or cleared by the [place] FDA and  has been authorized for detection and/or diagnosis of SARS-CoV-2 by FDA under an Emergency Use Authorization (EUA). This EUA will remain  in effect (meaning this test can be used) for the duration of the COVID-19 declaration under Section 564(b)(1) of the Act, 21 U. S.C. section 360bbb-3(b)(1), unless the authorization is terminated or revoked sooner.   Performed at [facility], [place] [address] [place], [place] [address]          Radiology Studies: CT Head Wo Contrast  Result Date: 08/10/2020 CLINICAL DATA:  Mental status changes.  Coronavirus infection. EXAM: CT HEAD WITHOUT CONTRAST TECHNIQUE: Contiguous axial images were obtained from the base of the skull through the vertex without intravenous contrast. COMPARISON:  01/27/2018 FINDINGS: Brain: No evidence of accelerated atrophy. No evidence of acute infarction,  intra-axial mass lesion, hemorrhage, hydrocephalus or extra-axial collection 1 cm calcified meningioma at the right frontoparietal convexity, not significant. Vascular: Question a hyperdense MCA branch on the left in the sylvian fissure region. This is not a definite finding. Skull: Normal Sinuses/Orbits: Clear Other: None IMPRESSION: 1. No acute brain parenchymal finding by CT. Question a hyperdense MCA branch on the left in the Sylvian fissure region. This is not a definite finding. If there is clinical concern about a left MCA territory acute infarction, consider CT angiography. 2. 1 cm calcified meningioma at the right frontoparietal convexity, not significant. Electronically Signed   By: Nelson Chimes M.D.   On: 08/10/2020 02:52   CT Angio Chest PE W and/or Wo Contrast  Result Date: 08/10/2020 CLINICAL DATA:  Coronavirus pneumonia.  Question pulmonary emboli. EXAM: CT ANGIOGRAPHY CHEST WITH CONTRAST TECHNIQUE: Multidetector CT imaging of the chest was performed using the standard protocol during bolus administration of intravenous contrast. Multiplanar CT image reconstructions and MIPs were obtained to evaluate the vascular anatomy. CONTRAST:  54mL OMNIPAQUE IOHEXOL 350 MG/ML SOLN COMPARISON:  Chest radiography yesterday. FINDINGS: Cardiovascular: Cardiomegaly. No pericardial fluid. Some coronary artery calcification. No visible thoracic aortic atherosclerotic calcification. Pulmonary arterial opacification is moderate. There are no large pulmonary emboli. Small peripheral emboli might be inapparent, but none are seen. Mediastinum/Nodes: No mass or lymphadenopathy. Lungs/Pleura: No pleural effusion. Widespread patchy pulmonary infiltrates consistent with the clinical history of viral pneumonia. Fairly dense consolidation in the left lower lobe. There could possibly be superimposed bacterial pneumonia. Upper Abdomen: Left renal cyst.  Not completely evaluated. Musculoskeletal: Negative Review of the MIP images  confirms the above findings. IMPRESSION: 1. Pulmonary arterial opacification is moderate. No large pulmonary emboli. Small peripheral emboli might be inapparent, but none are seen. 2. Cardiomegaly. Some coronary artery calcification. 3. Widespread patchy pulmonary infiltrates consistent with the clinical history of viral pneumonia. Fairly dense consolidation in the left lower lobe could possibly be superimposed bacterial pneumonia. Electronically Signed   By: Nelson Chimes M.D.   On: 08/10/2020 01:48   DG Chest Port 1 View  Result Date: 08/09/2020 CLINICAL DATA:  70 year old female with shortness of breath and cough. Positive COVID-19 EXAM: PORTABLE CHEST 1 VIEW COMPARISON:  Chest radiograph dated 08/01/2020 FINDINGS: Bilateral confluent and streaky airspace opacities  consistent with multilobar pneumonia and in keeping with COVID-19. No pleural effusion pneumothorax. Stable cardiomegaly. No acute osseous pathology. IMPRESSION: Multilobar pneumonia. Electronically Signed   By: Anner Crete M.D.   On: 08/09/2020 22:57   Scheduled Meds: . aerochamber plus with mask  1 each Other Once  . aspirin  81 mg Oral Daily  . atenolol  25 mg Oral Daily  . atorvastatin  40 mg Oral QPM  . enoxaparin (LOVENOX) injection  40 mg Subcutaneous Q24H  . insulin aspart  0-15 Units Subcutaneous TID WC  . insulin aspart  0-5 Units Subcutaneous QHS  . losartan  50 mg Oral Daily  . methylPREDNISolone (SOLU-MEDROL) injection  1 mg/kg Intravenous Q12H   Followed by  . [START ON 08/13/2020] predniSONE  50 mg Oral Daily  . mometasone-formoterol  2 puff Inhalation BID   Continuous Infusions: . azithromycin 250 mg (08/10/20 0746)  . cefTRIAXone (ROCEPHIN)  IV    . lactated ringers Stopped (08/10/20 0745)     LOS: 0 days   Time spent: 75min  Caitland Porchia C Terin Dierolf, DO [facility]  If 7PM-7AM, please contact night-coverage [email or web address]  08/10/2020, 8:36 AM

## 2020-08-10 NOTE — ED Notes (Signed)
More alert talking more

## 2020-08-10 NOTE — ED Notes (Signed)
The pt has been taken back to c-t

## 2020-08-10 NOTE — Progress Notes (Signed)
RT obtained ABG on pt with the following results. MD Cardama notified of critical values. Pts liter flow titrated down to 6 LPM HFNC salter. RT will repeat gas in one hour. RT will continue to monitor.   Results for Heidi [name], Heidi [name] (MRN [id number]) as of 08/10/2020 02:37  Ref. Range 08/10/2020 02:26  Sample type Unknown ARTERIAL  pH, Arterial Latest Ref Range: 7.350 - 7.450  7.234 (L)  pCO2 arterial Latest Ref Range: 32.0 - 48.0 mmHg 84.6 (HH)  pO2, Arterial Latest Ref Range: 83.0 - 108.0 mmHg 104  TCO2 Latest Ref Range: 22 - 32 mmol/L 37 (H)  Acid-Base Excess Latest Ref Range: 0.0 - 2.0 mmol/L 5.0 (H)  Bicarbonate Latest Ref Range: 20.0 - 28.0 mmol/L 35.0 (H)  O2 Saturation Latest Units: % 95.0  Patient temperature Unknown 101.8 F  Collection site Unknown Brachial

## 2020-08-10 NOTE — ED Notes (Signed)
Pt desated during rolling for a brief change into the 60's those v/s were validated. Pt was initially on 4L & was put to 6L and coached to breath in through her nose & she came back up to 90%. Dr. Tamala Julian in ICU was informed.

## 2020-08-10 NOTE — Progress Notes (Signed)
RT obtained ABG on pt with the following results. Critical values reported to MD Cardama. RT will continue to monitor.   Results for Heidi [name], Heidi [name] (MRN [id number]) as of 08/10/2020 06:24  Ref. Range 08/10/2020 06:18  Sample type Unknown ARTERIAL  pH, Arterial Latest Ref Range: 7.350 - 7.450  7.265 (L)  pCO2 arterial Latest Ref Range: 32.0 - 48.0 mmHg 74.4 (HH)  pO2, Arterial Latest Ref Range: 83.0 - 108.0 mmHg 79 (L)  TCO2 Latest Ref Range: 22 - 32 mmol/L 36 (H)  Acid-Base Excess Latest Ref Range: 0.0 - 2.0 mmol/L 4.0 (H)  Bicarbonate Latest Ref Range: 20.0 - 28.0 mmol/L 33.9 (H)  O2 Saturation Latest Units: % 93.0  Patient temperature Unknown 98.0 F  Collection site Unknown Radial

## 2020-08-10 NOTE — Progress Notes (Signed)
Briefly reevaluated patient while in the ED.   Awake, alert.  Mild tachypnea.  Saturating in mid to high 90s on 15L HF [place].   Cont to monitor.  ICU level care while awaiting physical tx to ICU.

## 2020-08-10 NOTE — ED Notes (Signed)
Pt does not answer any questions  Asked

## 2020-08-10 NOTE — ED Notes (Signed)
Pt was continually dropping below 85% to as low as 77% on 8L HFNC.  It wasn't much better on 10L.  12L HFNC seems to be holding her around 87%

## 2020-08-10 NOTE — ED Notes (Signed)
Avon Gully, MD informed of CBG 501.

## 2020-08-10 NOTE — ED Notes (Signed)
Pt has returned from MRI, Avon Gully, MD at bedside.

## 2020-08-10 NOTE — Progress Notes (Signed)
RT obtained ABG on pt with the following results. Criticals given to MD Cardama. Pt placed on NIV/PCV BIPAP at this time 15/5 BUR 16 100%. RT will continue to monitor.   Results for Heidi [name], Heidi [name] (MRN [id number]) as of 08/10/2020 04:53  Ref. Range 08/10/2020 04:03  Sample type Unknown ARTERIAL  pH, Arterial Latest Ref Range: 7.350 - 7.450  7.236 (L)  pCO2 arterial Latest Ref Range: 32.0 - 48.0 mmHg 83.7 (HH)  pO2, Arterial Latest Ref Range: 83.0 - 108.0 mmHg 68 (L)  TCO2 Latest Ref Range: 22 - 32 mmol/L 38 (H)  Acid-Base Excess Latest Ref Range: 0.0 - 2.0 mmol/L 5.0 (H)  Bicarbonate Latest Ref Range: 20.0 - 28.0 mmol/L 35.5 (H)  O2 Saturation Latest Units: % 88.0  Patient temperature Unknown 98.8 F  Collection site Unknown Radial

## 2020-08-11 DIAGNOSIS — U071 COVID-19: Secondary | ICD-10-CM | POA: Diagnosis not present

## 2020-08-11 DIAGNOSIS — G9349 Other encephalopathy: Secondary | ICD-10-CM | POA: Diagnosis not present

## 2020-08-11 LAB — C-REACTIVE PROTEIN: CRP: 6.9 mg/dL — ABNORMAL HIGH (ref <1.0)

## 2020-08-11 LAB — CBC WITH DIFFERENTIAL/PLATELET
Abs Immature Granulocytes: 0.05 10*3/uL (ref 0.00–0.07)
Basophils Absolute: 0 10*3/uL (ref 0.0–0.1)
Basophils Relative: 0 %
Eosinophils Absolute: 0 10*3/uL (ref 0.0–0.5)
Eosinophils Relative: 0 %
HCT: 39 % (ref 36.0–46.0)
Hemoglobin: 12.1 g/dL (ref 12.0–15.0)
Immature Granulocytes: 1 %
Lymphocytes Relative: 16 %
Lymphs Abs: 1 10*3/uL (ref 0.7–4.0)
MCH: 28 pg (ref 26.0–34.0)
MCHC: 31 g/dL (ref 30.0–36.0)
MCV: 90.3 fL (ref 80.0–100.0)
Monocytes Absolute: 0.3 10*3/uL (ref 0.1–1.0)
Monocytes Relative: 4 %
Neutro Abs: 4.9 10*3/uL (ref 1.7–7.7)
Neutrophils Relative %: 79 %
Platelets: 215 10*3/uL (ref 150–400)
RBC: 4.32 MIL/uL (ref 3.87–5.11)
RDW: 14.7 % (ref 11.5–15.5)
WBC: 6.2 10*3/uL (ref 4.0–10.5)
nRBC: 0.3 % — ABNORMAL HIGH (ref 0.0–0.2)

## 2020-08-11 LAB — COMPREHENSIVE METABOLIC PANEL
ALT: 29 U/L (ref 0–44)
AST: 39 U/L (ref 15–41)
Albumin: 2.8 g/dL — ABNORMAL LOW (ref 3.5–5.0)
Alkaline Phosphatase: 55 U/L (ref 38–126)
Anion gap: 12 (ref 5–15)
BUN: 30 mg/dL — ABNORMAL HIGH (ref 8–23)
CO2: 33 mmol/L — ABNORMAL HIGH (ref 22–32)
Calcium: 9 mg/dL (ref 8.9–10.3)
Chloride: 99 mmol/L (ref 98–111)
Creatinine, Ser: 1.25 mg/dL — ABNORMAL HIGH (ref 0.44–1.00)
GFR, Estimated: 47 mL/min — ABNORMAL LOW (ref >60)
Glucose, Bld: 114 mg/dL — ABNORMAL HIGH (ref 70–99)
Potassium: 3.9 mmol/L (ref 3.5–5.1)
Sodium: 144 mmol/L (ref 135–145)
Total Bilirubin: 0.4 mg/dL (ref 0.3–1.2)
Total Protein: 6.4 g/dL — ABNORMAL LOW (ref 6.5–8.1)

## 2020-08-11 LAB — GLUCOSE, CAPILLARY
Glucose-Capillary: 199 mg/dL — ABNORMAL HIGH (ref 70–99)
Glucose-Capillary: 221 mg/dL — ABNORMAL HIGH (ref 70–99)
Glucose-Capillary: 252 mg/dL — ABNORMAL HIGH (ref 70–99)
Glucose-Capillary: 358 mg/dL — ABNORMAL HIGH (ref 70–99)

## 2020-08-11 LAB — CBG MONITORING, ED
Glucose-Capillary: 150 mg/dL — ABNORMAL HIGH (ref 70–99)
Glucose-Capillary: 100 mg/dL — ABNORMAL HIGH (ref 70–99)
Glucose-Capillary: 112 mg/dL — ABNORMAL HIGH (ref 70–99)
Glucose-Capillary: 125 mg/dL — ABNORMAL HIGH (ref 70–99)
Glucose-Capillary: 182 mg/dL — ABNORMAL HIGH (ref 70–99)
Glucose-Capillary: 226 mg/dL — ABNORMAL HIGH (ref 70–99)
Glucose-Capillary: 200 mg/dL — ABNORMAL HIGH (ref 70–99)
Glucose-Capillary: 233 mg/dL — ABNORMAL HIGH (ref 70–99)

## 2020-08-11 LAB — D-DIMER, QUANTITATIVE: D-Dimer, Quant: 1.52 ug/mL-FEU — ABNORMAL HIGH (ref 0.00–0.50)

## 2020-08-11 MED ORDER — ORAL CARE MOUTH RINSE
15.0000 mL | Freq: Two times a day (BID) | OROMUCOSAL | Status: DC
  Administered 2020-08-12 – 2020-08-17 (×8): 15 mL via OROMUCOSAL

## 2020-08-11 MED ORDER — LOPERAMIDE HCL 2 MG PO CAPS
4.0000 mg | ORAL_CAPSULE | ORAL | Status: DC | PRN
  Administered 2020-08-12: 4 mg via ORAL
  Filled 2020-08-11 (×2): qty 2

## 2020-08-11 MED ORDER — CHLORHEXIDINE GLUCONATE 0.12 % MT SOLN
15.0000 mL | Freq: Two times a day (BID) | OROMUCOSAL | Status: DC
  Administered 2020-08-12 – 2020-08-19 (×14): 15 mL via OROMUCOSAL
  Filled 2020-08-11 (×15): qty 15

## 2020-08-11 NOTE — ED Notes (Signed)
100mg  CBG

## 2020-08-11 NOTE — ED Notes (Signed)
Elink MD consulted for concern about q4 hr 10 unit insulin pushes and sliding scale doses. RN advised to use sliding scale parameters to determine if q4 10 unit order should be given.

## 2020-08-11 NOTE — Progress Notes (Addendum)
RN notified us of multiple orders for glucose check - q2h,. Q6h, AC/HS. Has SSI ordered AC/HS and is eating. Was given 10 units aspart and 50 units of 70/30 already.  I discontinued duplicate orders In addition, wrote for a one time 11 pm check to make sure it does not drop After that, will stick to the AC/HS checks  D/W RN   5 am: Bedside RN notified me that she has increased O2 needs and did not use her CPAP overnight She also says she is a little drowsy but able to wake up and follows all commands O2 is 92% on 15 liter o2, she was on 10 liter last night ABG and CXR ordered  Place back on cpap Asked RN to let us know when this is ready  6 am Noted CXR and ABG Called and spoke with RN Patient awake and answers all questions Not able to tolerate CPAP and refuses it but without CPAP, her ABG is better than what it was the last few days RN put her back on 8 liter nasal O2 and O2 sat is 88-93, this is acceptable . And no increase in work of breathing Continue to monitor on nasal o2. She will need pulm follow up.

## 2020-08-11 NOTE — ED Notes (Signed)
Attempted to call daughter.  Pt mentation improving.

## 2020-08-11 NOTE — Progress Notes (Signed)
Pt said she does not want to wear her CPAP. Advised pt to give it a try and try to wear for few hours. CPAP stand by in her room. RN said she will put her on if she changes her mind. 5L O2 will be used with the CPAP.

## 2020-08-11 NOTE — Progress Notes (Signed)
NAME:  Heidi [name], MRN:  [id number], DOB:  04-Apr-1951, LOS: 1 ADMISSION DATE:  08/09/2020, CONSULTATION DATE:  2/5 REFERRING MD:  Triad, CHIEF COMPLAINT:  Acute respiratory failure    Brief History:  70yo female with hx chronic respiratory failure on 2L home O2, COPD, chronic dCHF, CKDIII, OSA not compliant with CPAP who presented 2/4 with AMS.  Tested POS for COVID 1/27.  W/u revealed acute on chronic hypercarbic respiratory failure and she was placed on bipap.  PCCM consulted for potential ICU admission.   History of Present Illness:  70yo female with hx chronic respiratory failure on 2L home O2, COPD, chronic dCHF, CKDIII, OSA not compliant with CPAP who presented 2/4 with AMS.  Tested POS for COVID 1/27.  W/u revealed acute on chronic hypercarbic respiratory failure and she was placed on bipap.  PCCM consulted for potential ICU admission.   Past Medical History:   has a past medical history of Adenomatous colon polyp, Anemia, Anxiety, Asthma, CHF (congestive heart failure) ([place]), COPD (chronic obstructive pulmonary disease) ([place]), Diabetes mellitus without complication ([place]), High cholesterol, Hypertension, Left knee injury, and Pneumonia.   Significant Hospital Events:    Consults:    Procedures:    Significant Diagnostic Tests:  CT head 2/5>>>neg acute  CTA chest 2/5>>>1. Pulmonary arterial opacification is moderate. No large pulmonary emboli. Small peripheral emboli might be inapparent, but none are Seen. 2. Cardiomegaly. Some coronary artery calcification. 3. Widespread patchy pulmonary infiltrates consistent with the clinical history of viral pneumonia. Fairly dense consolidation in the left lower lobe could possibly be superimposed bacterial pneumonia  Micro Data:  BC x 2 2/5>>>  Antimicrobials:  Ceftriaxone 2/5>>> Azithro 2/5>>>   Interim History / Subjective:  Remains stable on 8L HFNC  Somnolent but arousal to voice    Objective   Blood pressure  110/63, pulse 73, temperature 98.7 F (37.1 C), temperature source Oral, resp. rate (!) 24, height 5\' 4"  (1.626 m), weight 107.5 kg, SpO2 96 %.        Intake/Output Summary (Last 24 hours) at 08/11/2020 1318 Last data filed at 08/11/2020 1154 Gross per 24 hour  Intake 100 ml  Output --  Net 100 ml   Filed Weights   08/09/20 2246  Weight: 107.5 kg    Examination: General: Chronically ill appearing elderly female lying in bed on HFNC, in NAD HEENT: ETT, MM pink/moist, PERRL,  Neuro: Alert and able to answer questions but she is sleepy, non focal  CV: s1s2 regular rate and rhythm, no murmur, rubs, or gallops,  PULM:  Diminished bilaterally, no added breath sounds, tolerating HFNC well  GI: soft, bowel sounds active in all 4 quadrants, non-tender, non-distended Extremities: warm/dry, no edema  Skin: no rashes or lesions  Resolved Hospital Problem list     Assessment & Plan:  Acute on chronic hypercarbic and hypoxic respiratory failure  COVID PNA superimposed on CAP COPD  -Outside window for remdesivir, Consider Baricitinib - will hold off for now given ?bacterial infection  P: BIPAP as needed  Supplemental oxygen for sats greater than 88% Continue BDs Encourage mobilization  Pulmonary hygiene  Follow intermittent ABG and CXR Continue steroids   AMS  - likely r/t hypercarbia  P: Treat underlying cause  BIPAP as above  Avoid sedation  Delirium precautions   DM  -Home medications include Farxiga and 70/30 P:  Hold home medications  SSI CBG q4hrs   Hx HTN  Hx HLD  -Home medications include atenolol, Lipitor, Lasix,  HCTZ, and Cozaar P: Continue home atenolol, coxaar Continue home statin Continuous telemetry   AKI on CKDIII P: Follow renal function / urine output Trend Bmet Avoid nephrotoxins Ensure adequate renal perfusion   Best practice (evaluated daily)  Diet: npo Pain/Anxiety/Delirium protocol (if indicated): n/a VAP protocol (if indicated):  n/a DVT prophylaxis: lovenox GI prophylaxis: n/a Glucose control: SSI Mobility: BR Disposition:ICU  Goals of Care:  Last date of multidisciplinary goals of care discussion: Family and staff present:  Summary of discussion:  Follow up goals of care discussion due: 2/12 Code Status: full  Labs   CBC: Recent Labs  Lab 08/09/20 2328 08/10/20 0226 08/10/20 0403 08/10/20 0618 08/11/20 0500  WBC  --   --   --   --  6.2  NEUTROABS  --   --   --   --  4.9  HGB 14.3 12.9 13.3 13.3 12.1  HCT 42.0 38.0 39.0 39.0 39.0  MCV  --   --   --   --  90.3  PLT  --   --   --   --  215    Basic Metabolic Panel: Recent Labs  Lab 08/09/20 2315 08/09/20 2328 08/10/20 0226 08/10/20 0403 08/10/20 0500 08/10/20 0618 08/11/20 0500  NA 136 137 137 137  --  137 144  K 3.8 3.8 3.7 4.0  --  4.4 3.9  CL 94*  --   --   --   --   --  99  CO2 29  --   --   --   --   --  33*  GLUCOSE 379*  --   --   --   --   --  114*  BUN 28*  --   --   --   --   --  30*  CREATININE 1.43*  --   --   --   --   --  1.25*  CALCIUM 9.1  --   --   --   --   --  9.0  MG  --   --   --   --  2.6*  --   --    GFR: Estimated Creatinine Clearance: 51.5 mL/min (A) (by C-G formula based on SCr of 1.25 mg/dL (H)). Recent Labs  Lab 08/09/20 2315 08/11/20 0500  PROCALCITON <0.10  --   WBC  --  6.2  LATICACIDVEN 2.1*  --     Liver Function Tests: Recent Labs  Lab 08/09/20 2315 08/11/20 0500  AST 42* 39  ALT 28 29  ALKPHOS 65 55  BILITOT 0.4 0.4  PROT 6.9 6.4*  ALBUMIN 3.4* 2.8*   No results for input(s): LIPASE, AMYLASE in the last 168 hours. No results for input(s): AMMONIA in the last 168 hours.  ABG    Component Value Date/Time   PHART 7.265 (L) 08/10/2020 0618   PCO2ART 74.4 (HH) 08/10/2020 0618   PO2ART 79 (L) 08/10/2020 0618   HCO3 33.9 (H) 08/10/2020 0618   TCO2 36 (H) 08/10/2020 0618   O2SAT 93.0 08/10/2020 0618     Coagulation Profile: No results for input(s): INR, PROTIME in the last 168  hours.  Cardiac Enzymes: No results for input(s): CKTOTAL, CKMB, CKMBINDEX, TROPONINI in the last 168 hours.  HbA1C: HbA1c, POC (controlled diabetic range)  Date/Time Value Ref Range Status  05/14/2020 02:39 PM 9.9 (A) 0.0 - 7.0 % Final  05/03/2018 08:49 AM 10.0 (A) 0.0 - 7.0 % Final   Hgb A1c  MFr Bld  Date/Time Value Ref Range Status  11/13/2019 04:06 PM 10.5 (H) 4.8 - 5.6 % Final    Comment:             Prediabetes: 5.7 - 6.4          Diabetes: >6.4          Glycemic control for adults with diabetes: <7.0   06/14/2019 11:28 AM 9.5 (H) 4.8 - 5.6 % Final    Comment:             Prediabetes: 5.7 - 6.4          Diabetes: >6.4          Glycemic control for adults with diabetes: <7.0     CBG: Recent Labs  Lab 08/11/20 0559 08/11/20 0731 08/11/20 0923 08/11/20 1049 08/11/20 1215  GLUCAP 112* 125* 182* 226* 200*      Signature   Johnsie Cancel, NP-C  Pulmonary & Critical Care Personal contact information can be found on Amion  If no response please page: Adult pulmonary and critical care medicine pager on Amion unitl 7pm After 7pm please call [phone] 08/11/2020, 1:38 PM

## 2020-08-11 NOTE — ED Notes (Signed)
CCM at bedside 

## 2020-08-11 NOTE — ED Notes (Signed)
Lunch Tray Ordered @ 1105. 

## 2020-08-11 NOTE — ED Notes (Signed)
SDU Breakfast Ordered 

## 2020-08-11 NOTE — ED Notes (Addendum)
PT sats back in 90's on 15L.  Updated son.  He states that the daughter, who is the primary contact, is also hospitalized right now.

## 2020-08-11 NOTE — ED Notes (Signed)
Pt removed nasal cannula and O2 dropped to 51% (w/ good pleth). Pt informed to keep nasal cannula on. Pt complied. Pt stated that the high flow O2 was hurting her nose. Pt's O2 back at 92%. Hassan Rowan - RN aware of situation.

## 2020-08-12 ENCOUNTER — Inpatient Hospital Stay (HOSPITAL_COMMUNITY): Payer: Medicare HMO

## 2020-08-12 DIAGNOSIS — G9349 Other encephalopathy: Secondary | ICD-10-CM | POA: Diagnosis not present

## 2020-08-12 DIAGNOSIS — U071 COVID-19: Secondary | ICD-10-CM | POA: Diagnosis not present

## 2020-08-12 LAB — GLUCOSE, CAPILLARY
Glucose-Capillary: 148 mg/dL — ABNORMAL HIGH (ref 70–99)
Glucose-Capillary: 156 mg/dL — ABNORMAL HIGH (ref 70–99)
Glucose-Capillary: 200 mg/dL — ABNORMAL HIGH (ref 70–99)
Glucose-Capillary: 239 mg/dL — ABNORMAL HIGH (ref 70–99)
Glucose-Capillary: 178 mg/dL — ABNORMAL HIGH (ref 70–99)

## 2020-08-12 LAB — BASIC METABOLIC PANEL
Anion gap: 11 (ref 5–15)
BUN: 29 mg/dL — ABNORMAL HIGH (ref 8–23)
CO2: 33 mmol/L — ABNORMAL HIGH (ref 22–32)
Calcium: 9.1 mg/dL (ref 8.9–10.3)
Chloride: 100 mmol/L (ref 98–111)
Creatinine, Ser: 1.05 mg/dL — ABNORMAL HIGH (ref 0.44–1.00)
GFR, Estimated: 58 mL/min — ABNORMAL LOW (ref >60)
Glucose, Bld: 183 mg/dL — ABNORMAL HIGH (ref 70–99)
Potassium: 3.7 mmol/L (ref 3.5–5.1)
Sodium: 144 mmol/L (ref 135–145)

## 2020-08-12 LAB — BLOOD GAS, ARTERIAL
Acid-Base Excess: 9.8 mmol/L — ABNORMAL HIGH (ref 0.0–2.0)
Bicarbonate: 35.7 mmol/L — ABNORMAL HIGH (ref 20.0–28.0)
Drawn by: 59133
FIO2: 80
O2 Saturation: 88.1 %
Patient temperature: 36
pCO2 arterial: 64.9 mmHg — ABNORMAL HIGH (ref 32.0–48.0)
pH, Arterial: 7.353 (ref 7.350–7.450)
pO2, Arterial: 55.5 mmHg — ABNORMAL LOW (ref 83.0–108.0)

## 2020-08-12 LAB — MAGNESIUM: Magnesium: 2.3 mg/dL (ref 1.7–2.4)

## 2020-08-12 LAB — C-REACTIVE PROTEIN
CRP: 2.6 mg/dL — ABNORMAL HIGH (ref <1.0)
CRP: 2.1 mg/dL — ABNORMAL HIGH (ref <1.0)

## 2020-08-12 LAB — CBC
HCT: 42.4 % (ref 36.0–46.0)
Hemoglobin: 12.7 g/dL (ref 12.0–15.0)
MCH: 27 pg (ref 26.0–34.0)
MCHC: 30 g/dL (ref 30.0–36.0)
MCV: 90 fL (ref 80.0–100.0)
Platelets: 238 10*3/uL (ref 150–400)
RBC: 4.71 MIL/uL (ref 3.87–5.11)
RDW: 14.6 % (ref 11.5–15.5)
WBC: 6 10*3/uL (ref 4.0–10.5)
nRBC: 0.5 % — ABNORMAL HIGH (ref 0.0–0.2)

## 2020-08-12 LAB — D-DIMER, QUANTITATIVE
D-Dimer, Quant: 1 ug/mL-FEU — ABNORMAL HIGH (ref 0.00–0.50)
D-Dimer, Quant: 1.03 ug/mL-FEU — ABNORMAL HIGH (ref 0.00–0.50)

## 2020-08-12 LAB — BRAIN NATRIURETIC PEPTIDE: B Natriuretic Peptide: 28.9 pg/mL (ref 0.0–100.0)

## 2020-08-12 LAB — MRSA PCR SCREENING: MRSA by PCR: NEGATIVE

## 2020-08-12 LAB — PROCALCITONIN: Procalcitonin: <0.1 ng/mL

## 2020-08-12 MED ORDER — FUROSEMIDE 10 MG/ML IJ SOLN
60.0000 mg | Freq: Once | INTRAMUSCULAR | Status: AC
  Administered 2020-08-12: 60 mg via INTRAVENOUS
  Filled 2020-08-12: qty 6

## 2020-08-12 MED ORDER — PANTOPRAZOLE SODIUM 40 MG PO TBEC
40.0000 mg | DELAYED_RELEASE_TABLET | Freq: Every day | ORAL | Status: DC
  Administered 2020-08-12 – 2020-08-18 (×7): 40 mg via ORAL
  Filled 2020-08-12 (×7): qty 1

## 2020-08-12 MED ORDER — METHYLPREDNISOLONE SODIUM SUCC 40 MG IJ SOLR
40.0000 mg | Freq: Two times a day (BID) | INTRAMUSCULAR | Status: DC
  Administered 2020-08-12 – 2020-08-19 (×14): 40 mg via INTRAVENOUS
  Filled 2020-08-12 (×13): qty 1

## 2020-08-12 MED ORDER — ONDANSETRON HCL 4 MG/2ML IJ SOLN
4.0000 mg | Freq: Four times a day (QID) | INTRAMUSCULAR | Status: DC | PRN
  Administered 2020-08-12 – 2020-08-14 (×2): 4 mg via INTRAVENOUS
  Filled 2020-08-12 (×2): qty 2

## 2020-08-12 MED ORDER — OLOPATADINE HCL 0.1 % OP SOLN
1.0000 [drp] | Freq: Two times a day (BID) | OPHTHALMIC | Status: DC
  Administered 2020-08-12 – 2020-08-19 (×15): 1 [drp] via OPHTHALMIC
  Filled 2020-08-12: qty 5

## 2020-08-12 MED ORDER — TOPIRAMATE 25 MG PO TABS
75.0000 mg | ORAL_TABLET | Freq: Two times a day (BID) | ORAL | Status: DC
  Administered 2020-08-12 – 2020-08-19 (×15): 75 mg via ORAL
  Filled 2020-08-12 (×15): qty 3

## 2020-08-12 MED ORDER — POTASSIUM CHLORIDE CRYS ER 20 MEQ PO TBCR
40.0000 meq | EXTENDED_RELEASE_TABLET | Freq: Once | ORAL | Status: AC
  Administered 2020-08-12: 40 meq via ORAL
  Filled 2020-08-12: qty 2

## 2020-08-12 NOTE — Progress Notes (Addendum)
PROGRESS NOTE                                                                                                                                                                                                             Patient Demographics:    Heidi [name], is a 70 y.o. female, DOB - [birth year], [id number]  Outpatient Primary MD for the patient is Ladell Pier, MD    LOS - 2  Admit date - 08/09/2020    Chief Complaint  Patient presents with  . Altered Mental Status       Brief Narrative (HPI from H&P)  70 year old lady prior history of asthma, diastolic heart failure, hypertension, stage IIIa CKD, insulin-dependent diabetes mellitus, morbid obesity presents with shortness of breath and chest pain. Chest x-ray shows hazy bilateral opacities in the lung bases concerning for atypical infectious process such as viral pneumonia.   Subjective:    Heidi [name] today has, No headache, No chest pain, No abdominal pain - No Nausea, No new weakness tingling or numbness, mild SOB.   Assessment  & Plan :     1. Acute Hypoxic Resp. Failure due to Acute Covid 19 Viral Pneumonitis + Acute on Chronic Diastolic CHF - she unfortunately not vaccinated for Covid and she seems to have incurred moderate parenchymal lung injury.  She has been started on steroids and Remdesivir combination, continue to monitor, she has consented for Actemra use if needed.  Lasix as needed for CHF.  Encouraged the patient to sit up in chair in the daytime use I-S and flutter valve for pulmonary toiletry and then prone in bed when at night.  Will advance activity and titrate down oxygen as possible.  Actemra/Baricitinib  off label use - patient was told that if COVID-19 pneumonitis gets worse we might potentially use Actemra off label, patient denies any known history of active diverticulitis, tuberculosis or hepatitis, understands the risks and benefits  and wants to proceed with Actemra treatment if required.   SpO2: 94 % O2 Flow Rate (L/min): 8 L/min FiO2 (%): 100 %  Recent Labs  Lab 08/09/20 2315 08/09/20 2328 08/10/20 0226 08/10/20 0403 08/10/20 0618 08/11/20 0500 08/12/20 0410 08/12/20 0839  WBC  --   --   --   --   --  6.2 6.0  --   HGB  --    < >  12.9 13.3 13.3 12.1 12.7  --   HCT  --    < > 38.0 39.0 39.0 39.0 42.4  --   PLT  --   --   --   --   --  215 238  --   CRP 12.1*  --   --   --   --  6.9* 2.6* 2.1*  BNP  --   --   --   --   --   --   --  28.9  DDIMER 1.33*  --   --   --   --  1.52* 1.00* 1.03*  PROCALCITON <0.10  --   --   --   --   --   --  <0.10  AST 42*  --   --   --   --  39  --   --   ALT 28  --   --   --   --  29  --   --   ALKPHOS 65  --   --   --   --  55  --   --   BILITOT 0.4  --   --   --   --  0.4  --   --   ALBUMIN 3.4*  --   --   --   --  2.8*  --   --   LATICACIDVEN 2.1*  --   --   --   --   --   --   --    < > = values in this interval not displayed.    2.  Acute on chronic diastolic CHF EF [id number] on recent echocardiogram. Continue beta-blocker, diurese as needed.  Monitor.   3.  Breast nodule on CT angiogram.  Outpatient follow-up with OB and outpatient mammogram post discharge.  4.  CT head showing possible MCA nonspecific finding.  MRA brain or CT angiogram brain once renal function stabilizes and she is clinically more stable.  No focal neurological deficits.  5.  Thyroid nodule.  TSH normal, has had biopsy done in the past and deemed to be benign.  Follow with PCP.  6.  Dyslipidemia.  On Lipitor.  7.  AKI.  Currently resolved will monitor.  8.   Morbid obesity with underlying OSA.  BMI of 40, follow with PCP for weight loss, counseled on compliance with nighttime CPAP.  Outpatient sleep study.  9. DM type II.  On Lantus and sliding scale monitor and adjust.  Lab Results  Component Value Date   HGBA1C 9.9 (A) 05/14/2020   CBG (last 3)  Recent Labs    08/11/20 2334  08/12/20 0445 08/12/20 0742  GLUCAP 358* 148* 156*         Condition - Extremely Guarded  Family Communication  :  Eloy End [phone] - 08/12/20  Code Status :  Full  Consults  :  PCCM   Procedures  :     CTA - 1. Pulmonary arterial opacification is moderate. No large pulmonary emboli. Small peripheral emboli might be inapparent, but none are seen. 2. Cardiomegaly. Some coronary artery calcification. 3. Widespread patchy pulmonary infiltrates consistent with the clinical history of viral pneumonia. Fairly dense consolidation in the left lower lobe could possibly be superimposed bacterial pneumonia.  CT Head - no acute changes, questionable MCA nonspecific finding.  MRA -       PUD Prophylaxis :  PPI  Disposition Plan  :    Status is:  Inpatient  Remains inpatient appropriate because:IV treatments appropriate due to intensity of illness or inability to take PO   Dispo: The patient is from: Home              Anticipated d/c is to: Home              Anticipated d/c date is: > 3 days              Patient currently is not medically stable to d/c.   Difficult to place patient No  DVT Prophylaxis  :  Lovenox    Lab Results  Component Value Date   PLT 238 08/12/2020    Diet :  Diet Order            Diet Carb Modified Fluid consistency: Thin; Room service appropriate? Yes  Diet effective now                  Inpatient Medications  Scheduled Meds: . aerochamber plus with mask  1 each Other Once  . aspirin  81 mg Oral Daily  . atenolol  25 mg Oral Daily  . atorvastatin  40 mg Oral QPM  . chlorhexidine  15 mL Mouth Rinse BID  . enoxaparin (LOVENOX) injection  50 mg Subcutaneous Q24H  . furosemide  60 mg Intravenous Once  . insulin aspart  0-15 Units Subcutaneous TID WC  . insulin aspart  0-5 Units Subcutaneous QHS  . insulin aspart protamine- aspart  50 Units Subcutaneous BID WC  . mouth rinse  15 mL Mouth Rinse q12n4p  . methylPREDNISolone (SOLU-MEDROL)  injection  40 mg Intravenous Q12H  . mometasone-formoterol  2 puff Inhalation BID  . olopatadine  1 drop Both Eyes BID  . pantoprazole  40 mg Oral QHS  . potassium chloride  40 mEq Oral Once  . topiramate  75 mg Oral BID   Continuous Infusions: . remdesivir 100 mg in NS 100 mL 100 mg (08/12/20 0921)   PRN Meds:.acetaminophen, albuterol, guaiFENesin-dextromethorphan, loperamide, ondansetron (ZOFRAN) IV  Antibiotics  :    Anti-infectives (From admission, onward)   Start     Dose/Rate Route Frequency Ordered Stop   08/11/20 1000  remdesivir 100 mg in sodium chloride 0.9 % 100 mL IVPB       "Followed by" Linked Group Details   100 mg 200 mL/hr over 30 Minutes Intravenous Daily 08/10/20 1614 08/15/20 0959   08/10/20 1700  remdesivir 200 mg in sodium chloride 0.9% 250 mL IVPB       "Followed by" Linked Group Details   200 mg 580 mL/hr over 30 Minutes Intravenous Once 08/10/20 1614 08/10/20 2051   08/10/20 1600  cefTRIAXone (ROCEPHIN) 2 g in sodium chloride 0.9 % 100 mL IVPB  Status:  Discontinued        2 g 200 mL/hr over 30 Minutes Intravenous Every 24 hours 08/10/20 0513 08/10/20 0839   08/10/20 0600  azithromycin (ZITHROMAX) 250 mg in dextrose 5 % 125 mL IVPB  Status:  Discontinued        250 mg 125 mL/hr over 60 Minutes Intravenous Every 24 hours 08/10/20 0514 08/10/20 0839   08/10/20 0515  doxycycline (VIBRAMYCIN) 100 mg in sodium chloride 0.9 % 250 mL IVPB  Status:  Discontinued        100 mg 125 mL/hr over 120 Minutes Intravenous Every 12 hours 08/10/20 0513 08/10/20 0514   08/10/20 0215  cefTRIAXone (ROCEPHIN) 1 g in sodium chloride 0.9 % 100 mL IVPB  1 g 200 mL/hr over 30 Minutes Intravenous  Once 08/10/20 0202 08/10/20 0405       Time Spent in minutes  30   Lala Lund M.D on 08/12/2020 at 11:56 AM  To page go to [email or web address]   [facility] -  Office  [phone]    See all Orders from today for further details    Objective:   Vitals:    08/12/20 0544 08/12/20 0546 08/12/20 0549 08/12/20 0741  BP:  (!) 128/52  109/75  Pulse:  80 80 85  Resp:  (!) 35 (!) 33 (!) 32  Temp:  98.6 F (37 C)  98.5 F (36.9 C)  TempSrc:  Axillary  Oral  SpO2: 95% 93% 94% 94%  Weight:      Height:        Wt Readings from Last 3 Encounters:  08/09/20 107.5 kg  08/01/20 107.5 kg  06/13/20 107.9 kg     Intake/Output Summary (Last 24 hours) at 08/12/2020 1156 Last data filed at 08/11/2020 2110 Gross per 24 hour  Intake 480 ml  Output -  Net 480 ml     Physical Exam  Awake Alert, No new F.N deficits, Normal affect [place].AT,PERRAL Supple Neck,No JVD, No cervical lymphadenopathy appriciated.  Symmetrical Chest wall movement, Good air movement bilaterally, +ve rales RRR,No Gallops,Rubs or new Murmurs, No Parasternal Heave +ve B.Sounds, Abd Soft, No tenderness, No organomegaly appriciated, No rebound - guarding or rigidity. No Cyanosis, Clubbing or edema, No new Rash or bruise       Data Review:    CBC Recent Labs  Lab 08/10/20 0226 08/10/20 0403 08/10/20 0618 08/11/20 0500 08/12/20 0410  WBC  --   --   --  6.2 6.0  HGB 12.9 13.3 13.3 12.1 12.7  HCT 38.0 39.0 39.0 39.0 42.4  PLT  --   --   --  215 238  MCV  --   --   --  90.3 90.0  MCH  --   --   --  28.0 27.0  MCHC  --   --   --  31.0 30.0  RDW  --   --   --  14.7 14.6  LYMPHSABS  --   --   --  1.0  --   MONOABS  --   --   --  0.3  --   EOSABS  --   --   --  0.0  --   BASOSABS  --   --   --  0.0  --     Recent Labs  Lab 08/09/20 2315 08/09/20 2328 08/10/20 0226 08/10/20 0403 08/10/20 0500 08/10/20 0618 08/11/20 0500 08/12/20 0410 08/12/20 0839  NA 136   < > 137 137  --  137 144 144  --   K 3.8   < > 3.7 4.0  --  4.4 3.9 3.7  --   CL 94*  --   --   --   --   --  99 100  --   CO2 29  --   --   --   --   --  33* 33*  --   GLUCOSE 379*  --   --   --   --   --  114* 183*  --   BUN 28*  --   --   --   --   --  30* 29*  --   CREATININE 1.43*  --   --   --   --   --  1.25* 1.05*  --   CALCIUM 9.1  --   --   --   --   --  9.0 9.1  --   AST 42*  --   --   --   --   --  39  --   --   ALT 28  --   --   --   --   --  29  --   --   ALKPHOS 65  --   --   --   --   --  55  --   --   BILITOT 0.4  --   --   --   --   --  0.4  --   --   ALBUMIN 3.4*  --   --   --   --   --  2.8*  --   --   MG  --   --   --   --  2.6*  --   --   --  2.3  CRP 12.1*  --   --   --   --   --  6.9* 2.6* 2.1*  DDIMER 1.33*  --   --   --   --   --  1.52* 1.00* 1.03*  PROCALCITON <0.10  --   --   --   --   --   --   --  <0.10  LATICACIDVEN 2.1*  --   --   --   --   --   --   --   --   BNP  --   --   --   --   --   --   --   --  28.9   < > = values in this interval not displayed.    ------------------------------------------------------------------------------------------------------------------ Recent Labs    08/10/20 0015  TRIG 141    Lab Results  Component Value Date   HGBA1C 9.9 (A) 05/14/2020   ------------------------------------------------------------------------------------------------------------------ No results for input(s): TSH, T4TOTAL, T3FREE, THYROIDAB in the last 72 hours.  Invalid input(s): FREET3  Cardiac Enzymes No results for input(s): CKMB, TROPONINI, MYOGLOBIN in the last 168 hours.  Invalid input(s): CK ------------------------------------------------------------------------------------------------------------------    Component Value Date/Time   BNP 28.9 08/12/2020 0839    Micro Results Recent Results (from the past 240 hour(s))  Blood Culture (routine x 2)     Status: None (Preliminary result)   Collection Time: 08/09/20 11:18 PM   Specimen: BLOOD RIGHT FOREARM  Result Value Ref Range Status   Specimen Description BLOOD RIGHT FOREARM  Final   Special Requests   Final    BOTTLES DRAWN AEROBIC AND ANAEROBIC Blood Culture adequate volume   Culture   Final    NO GROWTH 2 DAYS Performed at [facility], [address], [place],  [place] [address]    Report Status PENDING  Incomplete  Blood Culture (routine x 2)     Status: None (Preliminary result)   Collection Time: 08/09/20 11:50 PM   Specimen: BLOOD LEFT FOREARM  Result Value Ref Range Status   Specimen Description BLOOD LEFT FOREARM  Final   Special Requests   Final    BOTTLES DRAWN AEROBIC AND ANAEROBIC Blood Culture adequate volume   Culture   Final    NO GROWTH 2 DAYS Performed at [facility], [place] [address], [place], [place] [address]    Report Status PENDING  Incomplete  MRSA PCR Screening  Status: None   Collection Time: 08/11/20 11:56 PM   Specimen: Nasal Mucosa; Nasopharyngeal  Result Value Ref Range Status   MRSA by PCR NEGATIVE NEGATIVE Final    Comment:        The GeneXpert MRSA Assay (FDA approved for NASAL specimens only), is one component of a comprehensive MRSA colonization surveillance program. It is not intended to diagnose MRSA infection nor to guide or monitor treatment for MRSA infections. Performed at [facility], [place] [address] [place], [place] [address]     Radiology Reports DG Chest 2 View  Addendum Date: 08/01/2020   ADDENDUM REPORT: 08/01/2020 15:09 ADDENDUM: Additional history: COPD, asthma Electronically Signed   By: Lavonia Dana M.D.   On: 08/01/2020 15:09   Result Date: 08/01/2020 CLINICAL DATA:  Shortness of breath, sore throat, fever and headache for 3 days, of both grandchildren tested positive for COVID-19 yesterday EXAM: CHEST - 2 VIEW COMPARISON:  02/28/2020 FINDINGS: Enlargement of cardiac silhouette. Mediastinal contours and pulmonary vascularity normal. Minimal linear atelectasis at lingula and RIGHT base. Central peribronchial thickening without definite pulmonary infiltrate, pleural effusion, or pneumothorax. Bones demineralized. IMPRESSION: Enlargement of cardiac silhouette with minimal bibasilar atelectasis. Electronically Signed: By: Lavonia Dana M.D. On: 08/01/2020 15:06   CT Head Wo  Contrast  Result Date: 08/10/2020 CLINICAL DATA:  Mental status changes.  Coronavirus infection. EXAM: CT HEAD WITHOUT CONTRAST TECHNIQUE: Contiguous axial images were obtained from the base of the skull through the vertex without intravenous contrast. COMPARISON:  01/27/2018 FINDINGS: Brain: No evidence of accelerated atrophy. No evidence of acute infarction, intra-axial mass lesion, hemorrhage, hydrocephalus or extra-axial collection 1 cm calcified meningioma at the right frontoparietal convexity, not significant. Vascular: Question a hyperdense MCA branch on the left in the sylvian fissure region. This is not a definite finding. Skull: Normal Sinuses/Orbits: Clear Other: None IMPRESSION: 1. No acute brain parenchymal finding by CT. Question a hyperdense MCA branch on the left in the Sylvian fissure region. This is not a definite finding. If there is clinical concern about a left MCA territory acute infarction, consider CT angiography. 2. 1 cm calcified meningioma at the right frontoparietal convexity, not significant. Electronically Signed   By: Nelson Chimes M.D.   On: 08/10/2020 02:52   CT Angio Chest PE W and/or Wo Contrast  Result Date: 08/10/2020 CLINICAL DATA:  Coronavirus pneumonia.  Question pulmonary emboli. EXAM: CT ANGIOGRAPHY CHEST WITH CONTRAST TECHNIQUE: Multidetector CT imaging of the chest was performed using the standard protocol during bolus administration of intravenous contrast. Multiplanar CT image reconstructions and MIPs were obtained to evaluate the vascular anatomy. CONTRAST:  39mL OMNIPAQUE IOHEXOL 350 MG/ML SOLN COMPARISON:  Chest radiography yesterday. FINDINGS: Cardiovascular: Cardiomegaly. No pericardial fluid. Some coronary artery calcification. No visible thoracic aortic atherosclerotic calcification. Pulmonary arterial opacification is moderate. There are no large pulmonary emboli. Small peripheral emboli might be inapparent, but none are seen. Mediastinum/Nodes: No mass or  lymphadenopathy. Lungs/Pleura: No pleural effusion. Widespread patchy pulmonary infiltrates consistent with the clinical history of viral pneumonia. Fairly dense consolidation in the left lower lobe. There could possibly be superimposed bacterial pneumonia. Upper Abdomen: Left renal cyst.  Not completely evaluated. Musculoskeletal: Negative Review of the MIP images confirms the above findings. IMPRESSION: 1. Pulmonary arterial opacification is moderate. No large pulmonary emboli. Small peripheral emboli might be inapparent, but none are seen. 2. Cardiomegaly. Some coronary artery calcification. 3. Widespread patchy pulmonary infiltrates consistent with the clinical history of viral pneumonia. Fairly dense consolidation in the  left lower lobe could possibly be superimposed bacterial pneumonia. Electronically Signed   By: Nelson Chimes M.D.   On: 08/10/2020 01:48   MR BRAIN WO CONTRAST  Result Date: 08/10/2020 CLINICAL DATA:  Mental status change with unknown cause. EXAM: MRI HEAD WITHOUT CONTRAST TECHNIQUE: Multiplanar, multiecho pulse sequences of the brain and surrounding structures were obtained without intravenous contrast. COMPARISON:  01/27/2018 FINDINGS: Brain: No acute infarction, hemorrhage, hydrocephalus, extra-axial collection or mass lesion. Mild for age chronic small vessel ischemic type change in the periventricular white matter and pons. Age normal brain volume Vascular: Normal flow voids Skull and upper cervical spine: Normal marrow signal Sinuses/Orbits: Bilateral cataract resection.  No acute finding IMPRESSION: No acute finding or cause for symptoms. Electronically Signed   By: Monte Fantasia M.D.   On: 08/10/2020 10:10   DG Chest Port 1 View  Result Date: 08/12/2020 CLINICAL DATA:  Hypoxia.  COVID. EXAM: PORTABLE CHEST 1 VIEW COMPARISON:  08/09/2020 FINDINGS: 0520 hours. The cardio pericardial silhouette is enlarged. Low volume film with patchy bilateral airspace disease superimposed on  underlying diffuse interstitial coarsening. No substantial pleural effusion. No pneumothorax. Telemetry leads overlie the chest. IMPRESSION: No substantial interval change. Electronically Signed   By: Misty [name] M.D.   On: 08/12/2020 05:27   DG Chest Port 1 View  Result Date: 08/09/2020 CLINICAL DATA:  70 year old female with shortness of breath and cough. Positive COVID-19 EXAM: PORTABLE CHEST 1 VIEW COMPARISON:  Chest radiograph dated 08/01/2020 FINDINGS: Bilateral confluent and streaky airspace opacities consistent with multilobar pneumonia and in keeping with COVID-19. No pleural effusion pneumothorax. Stable cardiomegaly. No acute osseous pathology. IMPRESSION: Multilobar pneumonia. Electronically Signed   By: Anner Crete M.D.   On: 08/09/2020 22:57

## 2020-08-12 NOTE — Progress Notes (Signed)
ABG collected and send down to the lab. Called the lab.

## 2020-08-12 NOTE — Progress Notes (Signed)
   08/12/20 0546  Assess: MEWS Score  Temp 98.6 F (37 C)  BP (!) 128/52  Pulse Rate 80  ECG Heart Rate 80  Resp (!) 35  Level of Consciousness Alert  SpO2 93 %  O2 Device HFNC  O2 Flow Rate (L/min) 9 L/min  Assess: MEWS Score  MEWS Temp 0  MEWS Systolic 0  MEWS Pulse 0  MEWS RR 2  MEWS LOC 0  MEWS Score 2  MEWS Score Color Yellow  Assess: if the MEWS score is Yellow or Red  Were vital signs taken at a resting state? Yes  Focused Assessment Change from prior assessment (see assessment flowsheet)  Early Detection of Sepsis Score *See Row Information* Low  MEWS guidelines implemented *See Row Information* Yes  Treat  MEWS Interventions Other (Comment) (none needed)  Take Vital Signs  Increase Vital Sign Frequency  Yellow: Q 2hr X 2 then Q 4hr X 2, if remains yellow, continue Q 4hrs  Escalate  MEWS: Escalate Yellow: discuss with charge nurse/RN and consider discussing with provider and RRT  Notify: Charge Nurse/RN  Name of Charge Nurse/RN Notified Nikki  Date Charge Nurse/RN Notified 08/12/20  Time Charge Nurse/RN Notified [phone]  Document  Patient Outcome Stabilized after interventions (Oxygen administration charted)  Progress note created (see row info) Yes

## 2020-08-13 ENCOUNTER — Inpatient Hospital Stay (HOSPITAL_COMMUNITY): Payer: Medicare HMO

## 2020-08-13 DIAGNOSIS — G9349 Other encephalopathy: Secondary | ICD-10-CM | POA: Diagnosis not present

## 2020-08-13 DIAGNOSIS — U071 COVID-19: Secondary | ICD-10-CM | POA: Diagnosis not present

## 2020-08-13 LAB — CBC WITH DIFFERENTIAL/PLATELET
Abs Immature Granulocytes: 0.06 10*3/uL (ref 0.00–0.07)
Basophils Absolute: 0 10*3/uL (ref 0.0–0.1)
Basophils Relative: 0 %
Eosinophils Absolute: 0 10*3/uL (ref 0.0–0.5)
Eosinophils Relative: 0 %
HCT: 43 % (ref 36.0–46.0)
Hemoglobin: 12.8 g/dL (ref 12.0–15.0)
Immature Granulocytes: 1 %
Lymphocytes Relative: 16 %
Lymphs Abs: 1 10*3/uL (ref 0.7–4.0)
MCH: 26.8 pg (ref 26.0–34.0)
MCHC: 29.8 g/dL — ABNORMAL LOW (ref 30.0–36.0)
MCV: 90.1 fL (ref 80.0–100.0)
Monocytes Absolute: 0.3 10*3/uL (ref 0.1–1.0)
Monocytes Relative: 5 %
Neutro Abs: 4.6 10*3/uL (ref 1.7–7.7)
Neutrophils Relative %: 78 %
Platelets: 252 10*3/uL (ref 150–400)
RBC: 4.77 MIL/uL (ref 3.87–5.11)
RDW: 14.4 % (ref 11.5–15.5)
WBC: 5.9 10*3/uL (ref 4.0–10.5)
nRBC: 0.5 % — ABNORMAL HIGH (ref 0.0–0.2)

## 2020-08-13 LAB — COMPREHENSIVE METABOLIC PANEL
ALT: 27 U/L (ref 0–44)
AST: 30 U/L (ref 15–41)
Albumin: 2.7 g/dL — ABNORMAL LOW (ref 3.5–5.0)
Alkaline Phosphatase: 48 U/L (ref 38–126)
Anion gap: 11 (ref 5–15)
BUN: 33 mg/dL — ABNORMAL HIGH (ref 8–23)
CO2: 34 mmol/L — ABNORMAL HIGH (ref 22–32)
Calcium: 9.3 mg/dL (ref 8.9–10.3)
Chloride: 100 mmol/L (ref 98–111)
Creatinine, Ser: 1.15 mg/dL — ABNORMAL HIGH (ref 0.44–1.00)
GFR, Estimated: 52 mL/min — ABNORMAL LOW (ref >60)
Glucose, Bld: 104 mg/dL — ABNORMAL HIGH (ref 70–99)
Potassium: 3.9 mmol/L (ref 3.5–5.1)
Sodium: 145 mmol/L (ref 135–145)
Total Bilirubin: 0.6 mg/dL (ref 0.3–1.2)
Total Protein: 5.8 g/dL — ABNORMAL LOW (ref 6.5–8.1)

## 2020-08-13 LAB — GLUCOSE, CAPILLARY
Glucose-Capillary: 155 mg/dL — ABNORMAL HIGH (ref 70–99)
Glucose-Capillary: 130 mg/dL — ABNORMAL HIGH (ref 70–99)
Glucose-Capillary: 62 mg/dL — ABNORMAL LOW (ref 70–99)
Glucose-Capillary: 84 mg/dL (ref 70–99)
Glucose-Capillary: 190 mg/dL — ABNORMAL HIGH (ref 70–99)

## 2020-08-13 LAB — MAGNESIUM: Magnesium: 2.3 mg/dL (ref 1.7–2.4)

## 2020-08-13 LAB — PROCALCITONIN: Procalcitonin: <0.1 ng/mL

## 2020-08-13 LAB — C-REACTIVE PROTEIN: CRP: 1.1 mg/dL — ABNORMAL HIGH (ref <1.0)

## 2020-08-13 LAB — BRAIN NATRIURETIC PEPTIDE: B Natriuretic Peptide: 419.3 pg/mL — ABNORMAL HIGH (ref 0.0–100.0)

## 2020-08-13 LAB — D-DIMER, QUANTITATIVE: D-Dimer, Quant: 0.92 ug/mL-FEU — ABNORMAL HIGH (ref 0.00–0.50)

## 2020-08-13 MED ORDER — POTASSIUM CHLORIDE CRYS ER 20 MEQ PO TBCR
20.0000 meq | EXTENDED_RELEASE_TABLET | Freq: Once | ORAL | Status: AC
  Administered 2020-08-13: 20 meq via ORAL
  Filled 2020-08-13: qty 1

## 2020-08-13 MED ORDER — GLUCAGON HCL RDNA (DIAGNOSTIC) 1 MG IJ SOLR
INTRAMUSCULAR | Status: AC
  Filled 2020-08-13: qty 1

## 2020-08-13 MED ORDER — LORAZEPAM 2 MG/ML IJ SOLN
1.0000 mg | Freq: Once | INTRAMUSCULAR | Status: AC | PRN
  Administered 2020-08-13: 1 mg via INTRAVENOUS
  Filled 2020-08-13: qty 1

## 2020-08-13 MED ORDER — FUROSEMIDE 10 MG/ML IJ SOLN
40.0000 mg | Freq: Once | INTRAMUSCULAR | Status: AC
  Administered 2020-08-13: 40 mg via INTRAVENOUS
  Filled 2020-08-13: qty 4

## 2020-08-13 NOTE — Progress Notes (Signed)
Called pt's daughter, [name] back at [phone] from to give updates on pt but no answer.

## 2020-08-13 NOTE — Progress Notes (Signed)
PROGRESS NOTE                                                                                                                                                                                                             Patient Demographics:    Heidi [name], is a 70 y.o. female, DOB - Mar 30, 1951, MOQ:[id number]  Outpatient Primary MD for the patient is Ladell Pier, MD    LOS - 3  Admit date - 08/09/2020    Chief Complaint  Patient presents with  . Altered Mental Status       Brief Narrative (HPI from H&P)  70 year old lady prior history of asthma, diastolic heart failure, hypertension, stage IIIa CKD, insulin-dependent diabetes mellitus, morbid obesity presents with shortness of breath and chest pain. Chest x-ray shows hazy bilateral opacities in the lung bases concerning for atypical infectious process such as viral pneumonia.   Subjective:   Patient in bed, appears comfortable, denies any headache, no fever, no chest pain or pressure, no shortness of breath , feels her belly is full but no pain. No focal weakness.    Assessment  & Plan :     1. Acute Hypoxic Resp. Failure due to Acute Covid 19 Viral Pneumonitis + Acute on Chronic Diastolic CHF - she unfortunately not vaccinated for Covid and she seems to have incurred moderate parenchymal lung injury.  She has been started on steroids and Remdesivir combination, continue to monitor, she has consented for Actemra use if needed.  Lasix as needed for CHF.  Encouraged the patient to sit up in chair in the daytime use I-S and flutter valve for pulmonary toiletry and then prone in bed when at night.  Will advance activity and titrate down oxygen as possible.  Actemra/Baricitinib  off label use - patient was told that if COVID-19 pneumonitis gets worse we might potentially use Actemra off label, patient denies any known history of active diverticulitis, tuberculosis or  hepatitis, understands the risks and benefits and wants to proceed with Actemra treatment if required.   SpO2: 98 % O2 Flow Rate (L/min): 2 L/min FiO2 (%): 100 %  Recent Labs  Lab 08/09/20 2315 08/09/20 2328 08/10/20 0403 08/10/20 0618 08/11/20 0500 08/12/20 0410 08/12/20 0839 08/13/20 0108  WBC  --   --   --   --  6.2 6.0  --  5.9  HGB  --    < > 13.3 13.3 12.1 12.7  --  12.8  HCT  --    < > 39.0 39.0 39.0 42.4  --  43.0  PLT  --   --   --   --  215 238  --  252  CRP 12.1*  --   --   --  6.9* 2.6* 2.1* 1.1*  BNP  --   --   --   --   --   --  28.9 419.3*  DDIMER 1.33*  --   --   --  1.52* 1.00* 1.03* 0.92*  PROCALCITON <0.10  --   --   --   --   --  <0.10 <0.10  AST 42*  --   --   --  39  --   --  30  ALT 28  --   --   --  29  --   --  27  ALKPHOS 65  --   --   --  55  --   --  48  BILITOT 0.4  --   --   --  0.4  --   --  0.6  ALBUMIN 3.4*  --   --   --  2.8*  --   --  2.7*  LATICACIDVEN 2.1*  --   --   --   --   --   --   --    < > = values in this interval not displayed.    2.  Acute on chronic diastolic CHF EF 32% on recent echocardiogram. Continue beta-blocker, IV Lasix for diuresis, dosing Lasix daily.  Monitor.   3.  Breast nodule on CT angiogram.  Outpatient follow-up with OB and outpatient mammogram post discharge.  4.  CT head showing possible MCA nonspecific finding.  MRA brain toady.  No focal neurological deficits.  5.  Thyroid nodule.  TSH normal, has had biopsy done in the past and deemed to be benign.  Follow with PCP.  6.  Dyslipidemia.  On Lipitor.  7.  AKI on CKD 2. AKI much improved, baseline creatinine around 1.2.  8.   Morbid obesity with underlying OSA.  BMI of 40, follow with PCP for weight loss, counseled on compliance with nighttime CPAP in the hospital, per pulmonary who had seen the patient initially will benefit from nighttime CPAP.  Outpatient sleep study pulmonary follow-up.  9. DM type II.  On Lantus and sliding scale monitor and  adjust.  Lab Results  Component Value Date   HGBA1C 9.9 (A) 05/14/2020   CBG (last 3)  Recent Labs    08/12/20 1719 08/12/20 2043 08/13/20 0743  GLUCAP 239* 178* 155*         Condition - Extremely Guarded  Family Communication  :  Eloy End [phone] - 08/12/20  Code Status :  Full  Consults  :  PCCM   Procedures  :     CTA - 1. Pulmonary arterial opacification is moderate. No large pulmonary emboli. Small peripheral emboli might be inapparent, but none are seen. 2. Cardiomegaly. Some coronary artery calcification. 3. Widespread patchy pulmonary infiltrates consistent with the clinical history of viral pneumonia. Fairly dense consolidation in the left lower lobe could possibly be superimposed bacterial pneumonia.  CT Head - no acute changes, questionable MCA nonspecific finding.  MRA -       PUD Prophylaxis :  PPI  Disposition Plan  :  Status is: Inpatient  Remains inpatient appropriate because:IV treatments appropriate due to intensity of illness or inability to take PO   Dispo: The patient is from: Home              Anticipated d/c is to: Home              Anticipated d/c date is: > 3 days              Patient currently is not medically stable to d/c.   Difficult to place patient No  DVT Prophylaxis  :  Lovenox    Lab Results  Component Value Date   PLT 252 08/13/2020    Diet :  Diet Order            Diet Carb Modified Fluid consistency: Thin; Room service appropriate? Yes  Diet effective now                  Inpatient Medications  Scheduled Meds: . aerochamber plus with mask  1 each Other Once  . aspirin  81 mg Oral Daily  . atenolol  25 mg Oral Daily  . atorvastatin  40 mg Oral QPM  . chlorhexidine  15 mL Mouth Rinse BID  . enoxaparin (LOVENOX) injection  50 mg Subcutaneous Q24H  . furosemide  40 mg Intravenous Once  . glucagon (human recombinant)      . insulin aspart  0-15 Units Subcutaneous TID WC  . insulin aspart  0-5 Units  Subcutaneous QHS  . insulin aspart protamine- aspart  50 Units Subcutaneous BID WC  . mouth rinse  15 mL Mouth Rinse q12n4p  . methylPREDNISolone (SOLU-MEDROL) injection  40 mg Intravenous Q12H  . mometasone-formoterol  2 puff Inhalation BID  . olopatadine  1 drop Both Eyes BID  . pantoprazole  40 mg Oral QHS  . potassium chloride  20 mEq Oral Once  . topiramate  75 mg Oral BID   Continuous Infusions: . remdesivir 100 mg in NS 100 mL 100 mg (08/13/20 0906)   PRN Meds:.acetaminophen, albuterol, guaiFENesin-dextromethorphan, loperamide, ondansetron (ZOFRAN) IV  Antibiotics  :    Anti-infectives (From admission, onward)   Start     Dose/Rate Route Frequency Ordered Stop   08/11/20 1000  remdesivir 100 mg in sodium chloride 0.9 % 100 mL IVPB       "Followed by" Linked Group Details   100 mg 200 mL/hr over 30 Minutes Intravenous Daily 08/10/20 1614 08/15/20 0959   08/10/20 1700  remdesivir 200 mg in sodium chloride 0.9% 250 mL IVPB       "Followed by" Linked Group Details   200 mg 580 mL/hr over 30 Minutes Intravenous Once 08/10/20 1614 08/10/20 2051   08/10/20 1600  cefTRIAXone (ROCEPHIN) 2 g in sodium chloride 0.9 % 100 mL IVPB  Status:  Discontinued        2 g 200 mL/hr over 30 Minutes Intravenous Every 24 hours 08/10/20 0513 08/10/20 0839   08/10/20 0600  azithromycin (ZITHROMAX) 250 mg in dextrose 5 % 125 mL IVPB  Status:  Discontinued        250 mg 125 mL/hr over 60 Minutes Intravenous Every 24 hours 08/10/20 0514 08/10/20 0839   08/10/20 0515  doxycycline (VIBRAMYCIN) 100 mg in sodium chloride 0.9 % 250 mL IVPB  Status:  Discontinued        100 mg 125 mL/hr over 120 Minutes Intravenous Every 12 hours 08/10/20 0513 08/10/20 0514   08/10/20 0215  cefTRIAXone (  ROCEPHIN) 1 g in sodium chloride 0.9 % 100 mL IVPB        1 g 200 mL/hr over 30 Minutes Intravenous  Once 08/10/20 0202 08/10/20 0405       Time Spent in minutes  30   Lala Lund M.D on 08/13/2020 at 11:07 AM  To  page go to [email or web address]   [facility] -  Office  [phone]    See all Orders from today for further details    Objective:   Vitals:   08/13/20 0507 08/13/20 0511 08/13/20 0733 08/13/20 1100  BP:   (!) 103/51   Pulse:   93   Resp:   (!) 22   Temp:   98.1 F (36.7 C)   TempSrc:   Oral   SpO2: (!) 87% 91% (!) 85% 98%  Weight:      Height:        Wt Readings from Last 3 Encounters:  08/09/20 107.5 kg  08/01/20 107.5 kg  06/13/20 107.9 kg     Intake/Output Summary (Last 24 hours) at 08/13/2020 1107 Last data filed at 08/13/2020 0828 Gross per 24 hour  Intake 480 ml  Output -  Net 480 ml     Physical Exam  Awake Alert, No new F.N deficits,   [place].AT,PERRAL Supple Neck,No JVD, No cervical lymphadenopathy appriciated.  Symmetrical Chest wall movement, Good air movement bilaterally, few rales RRR,No Gallops, Rubs or new Murmurs, No Parasternal Heave +ve B.Sounds, Abd Soft, No tenderness, No organomegaly appriciated, No rebound - guarding or rigidity. No Cyanosis, Clubbing or edema, No new Rash or bruise    Data Review:    CBC Recent Labs  Lab 08/10/20 0403 08/10/20 0618 08/11/20 0500 08/12/20 0410 08/13/20 0108  WBC  --   --  6.2 6.0 5.9  HGB 13.3 13.3 12.1 12.7 12.8  HCT 39.0 39.0 39.0 42.4 43.0  PLT  --   --  215 238 252  MCV  --   --  90.3 90.0 90.1  MCH  --   --  28.0 27.0 26.8  MCHC  --   --  31.0 30.0 29.8*  RDW  --   --  14.7 14.6 14.4  LYMPHSABS  --   --  1.0  --  1.0  MONOABS  --   --  0.3  --  0.3  EOSABS  --   --  0.0  --  0.0  BASOSABS  --   --  0.0  --  0.0    Recent Labs  Lab 08/09/20 2315 08/09/20 2328 08/10/20 0403 08/10/20 0500 08/10/20 0618 08/11/20 0500 08/12/20 0410 08/12/20 0839 08/13/20 0108  NA 136   < > 137  --  137 144 144  --  145  K 3.8   < > 4.0  --  4.4 3.9 3.7  --  3.9  CL 94*  --   --   --   --  99 100  --  100  CO2 29  --   --   --   --  33* 33*  --  34*  GLUCOSE 379*  --   --   --   --  114* 183*   --  104*  BUN 28*  --   --   --   --  30* 29*  --  33*  CREATININE 1.43*  --   --   --   --  1.25* 1.05*  --  1.15*  CALCIUM 9.1  --   --   --   --  9.0 9.1  --  9.3  AST 42*  --   --   --   --  39  --   --  30  ALT 28  --   --   --   --  29  --   --  27  ALKPHOS 65  --   --   --   --  55  --   --  48  BILITOT 0.4  --   --   --   --  0.4  --   --  0.6  ALBUMIN 3.4*  --   --   --   --  2.8*  --   --  2.7*  MG  --   --   --  2.6*  --   --   --  2.3 2.3  CRP 12.1*  --   --   --   --  6.9* 2.6* 2.1* 1.1*  DDIMER 1.33*  --   --   --   --  1.52* 1.00* 1.03* 0.92*  PROCALCITON <0.10  --   --   --   --   --   --  <0.10 <0.10  LATICACIDVEN 2.1*  --   --   --   --   --   --   --   --   BNP  --   --   --   --   --   --   --  28.9 419.3*   < > = values in this interval not displayed.    ------------------------------------------------------------------------------------------------------------------ No results for input(s): CHOL, HDL, LDLCALC, TRIG, CHOLHDL, LDLDIRECT in the last 72 hours.  Lab Results  Component Value Date   HGBA1C 9.9 (A) 05/14/2020   ------------------------------------------------------------------------------------------------------------------ No results for input(s): TSH, T4TOTAL, T3FREE, THYROIDAB in the last 72 hours.  Invalid input(s): FREET3  Cardiac Enzymes No results for input(s): CKMB, TROPONINI, MYOGLOBIN in the last 168 hours.  Invalid input(s): CK ------------------------------------------------------------------------------------------------------------------    Component Value Date/Time   BNP 419.3 (H) 08/13/2020 0108    Micro Results Recent Results (from the past 240 hour(s))  Blood Culture (routine x 2)     Status: None (Preliminary result)   Collection Time: 08/09/20 11:18 PM   Specimen: BLOOD RIGHT FOREARM  Result Value Ref Range Status   Specimen Description BLOOD RIGHT FOREARM  Final   Special Requests   Final    BOTTLES DRAWN AEROBIC AND  ANAEROBIC Blood Culture adequate volume   Culture   Final    NO GROWTH 3 DAYS Performed at [facility], [address] [place], [place] [address]    Report Status PENDING  Incomplete  Blood Culture (routine x 2)     Status: None (Preliminary result)   Collection Time: 08/09/20 11:50 PM   Specimen: BLOOD LEFT FOREARM  Result Value Ref Range Status   Specimen Description BLOOD LEFT FOREARM  Final   Special Requests   Final    BOTTLES DRAWN AEROBIC AND ANAEROBIC Blood Culture adequate volume   Culture   Final    NO GROWTH 3 DAYS Performed at [facility], [place] [address] [place],  [address]    Report Status PENDING  Incomplete  MRSA PCR Screening     Status: None   Collection Time: 08/11/20 11:56 PM   Specimen: Nasal Mucosa; Nasopharyngeal  Result Value Ref Range Status   MRSA by PCR NEGATIVE NEGATIVE Final    Comment:  The GeneXpert MRSA Assay (FDA approved for NASAL specimens only), is one component of a comprehensive MRSA colonization surveillance program. It is not intended to diagnose MRSA infection nor to guide or monitor treatment for MRSA infections. Performed at [facility], [place] [address] [place], [place] [address]     Radiology Reports DG Chest 2 View  Addendum Date: 08/01/2020   ADDENDUM REPORT: 08/01/2020 15:09 ADDENDUM: Additional history: COPD, asthma Electronically Signed   By: Lavonia Dana M.D.   On: 08/01/2020 15:09   Result Date: 08/01/2020 CLINICAL DATA:  Shortness of breath, sore throat, fever and headache for 3 days, of both grandchildren tested positive for COVID-19 yesterday EXAM: CHEST - 2 VIEW COMPARISON:  02/28/2020 FINDINGS: Enlargement of cardiac silhouette. Mediastinal contours and pulmonary vascularity normal. Minimal linear atelectasis at lingula and RIGHT base. Central peribronchial thickening without definite pulmonary infiltrate, pleural effusion, or pneumothorax. Bones demineralized. IMPRESSION: Enlargement  of cardiac silhouette with minimal bibasilar atelectasis. Electronically Signed: By: Lavonia Dana M.D. On: 08/01/2020 15:06   CT Head Wo Contrast  Result Date: 08/10/2020 CLINICAL DATA:  Mental status changes.  Coronavirus infection. EXAM: CT HEAD WITHOUT CONTRAST TECHNIQUE: Contiguous axial images were obtained from the base of the skull through the vertex without intravenous contrast. COMPARISON:  01/27/2018 FINDINGS: Brain: No evidence of accelerated atrophy. No evidence of acute infarction, intra-axial mass lesion, hemorrhage, hydrocephalus or extra-axial collection 1 cm calcified meningioma at the right frontoparietal convexity, not significant. Vascular: Question a hyperdense MCA branch on the left in the sylvian fissure region. This is not a definite finding. Skull: Normal Sinuses/Orbits: Clear Other: None IMPRESSION: 1. No acute brain parenchymal finding by CT. Question a hyperdense MCA branch on the left in the Sylvian fissure region. This is not a definite finding. If there is clinical concern about a left MCA territory acute infarction, consider CT angiography. 2. 1 cm calcified meningioma at the right frontoparietal convexity, not significant. Electronically Signed   By: Nelson Chimes M.D.   On: 08/10/2020 02:52   CT Angio Chest PE W and/or Wo Contrast  Result Date: 08/10/2020 CLINICAL DATA:  Coronavirus pneumonia.  Question pulmonary emboli. EXAM: CT ANGIOGRAPHY CHEST WITH CONTRAST TECHNIQUE: Multidetector CT imaging of the chest was performed using the standard protocol during bolus administration of intravenous contrast. Multiplanar CT image reconstructions and MIPs were obtained to evaluate the vascular anatomy. CONTRAST:  56mL OMNIPAQUE IOHEXOL 350 MG/ML SOLN COMPARISON:  Chest radiography yesterday. FINDINGS: Cardiovascular: Cardiomegaly. No pericardial fluid. Some coronary artery calcification. No visible thoracic aortic atherosclerotic calcification. Pulmonary arterial opacification is  moderate. There are no large pulmonary emboli. Small peripheral emboli might be inapparent, but none are seen. Mediastinum/Nodes: No mass or lymphadenopathy. Lungs/Pleura: No pleural effusion. Widespread patchy pulmonary infiltrates consistent with the clinical history of viral pneumonia. Fairly dense consolidation in the left lower lobe. There could possibly be superimposed bacterial pneumonia. Upper Abdomen: Left renal cyst.  Not completely evaluated. Musculoskeletal: Negative Review of the MIP images confirms the above findings. IMPRESSION: 1. Pulmonary arterial opacification is moderate. No large pulmonary emboli. Small peripheral emboli might be inapparent, but none are seen. 2. Cardiomegaly. Some coronary artery calcification. 3. Widespread patchy pulmonary infiltrates consistent with the clinical history of viral pneumonia. Fairly dense consolidation in the left lower lobe could possibly be superimposed bacterial pneumonia. Electronically Signed   By: Nelson Chimes M.D.   On: 08/10/2020 01:48   MR BRAIN WO CONTRAST  Result Date: 08/10/2020 CLINICAL DATA:  Mental status change with  unknown cause. EXAM: MRI HEAD WITHOUT CONTRAST TECHNIQUE: Multiplanar, multiecho pulse sequences of the brain and surrounding structures were obtained without intravenous contrast. COMPARISON:  01/27/2018 FINDINGS: Brain: No acute infarction, hemorrhage, hydrocephalus, extra-axial collection or mass lesion. Mild for age chronic small vessel ischemic type change in the periventricular white matter and pons. Age normal brain volume Vascular: Normal flow voids Skull and upper cervical spine: Normal marrow signal Sinuses/Orbits: Bilateral cataract resection.  No acute finding IMPRESSION: No acute finding or cause for symptoms. Electronically Signed   By: Monte Fantasia M.D.   On: 08/10/2020 10:10   DG Chest Port 1 View  Result Date: 08/13/2020 CLINICAL DATA:  Shortness of breath, cough, COVID EXAM: PORTABLE CHEST 1 VIEW  COMPARISON:  08/12/2020 FINDINGS: Bilateral pulmonary opacities remain present with similar lung aeration. No significant pleural effusion. No pneumothorax. Stable cardiomediastinal contours. IMPRESSION: Similar appearance of bilateral pulmonary opacities. Electronically Signed   By: Macy Mis M.D.   On: 08/13/2020 10:27   DG Chest Port 1 View  Result Date: 08/12/2020 CLINICAL DATA:  Hypoxia.  COVID. EXAM: PORTABLE CHEST 1 VIEW COMPARISON:  08/09/2020 FINDINGS: 0520 hours. The cardio pericardial silhouette is enlarged. Low volume film with patchy bilateral airspace disease superimposed on underlying diffuse interstitial coarsening. No substantial pleural effusion. No pneumothorax. Telemetry leads overlie the chest. IMPRESSION: No substantial interval change. Electronically Signed   By: Misty [name] M.D.   On: 08/12/2020 05:27   DG Chest Port 1 View  Result Date: 08/09/2020 CLINICAL DATA:  70 year old female with shortness of breath and cough. Positive COVID-19 EXAM: PORTABLE CHEST 1 VIEW COMPARISON:  Chest radiograph dated 08/01/2020 FINDINGS: Bilateral confluent and streaky airspace opacities consistent with multilobar pneumonia and in keeping with COVID-19. No pleural effusion pneumothorax. Stable cardiomegaly. No acute osseous pathology. IMPRESSION: Multilobar pneumonia. Electronically Signed   By: Anner Crete M.D.   On: 08/09/2020 22:57   DG Abd Portable 1V  Result Date: 08/13/2020 CLINICAL DATA:  Nausea. EXAM: PORTABLE ABDOMEN - 1 VIEW COMPARISON:  None. FINDINGS: No gaseous small moderate gaseous distention of the stomach noted bowel dilatation. Air is seen scattered along the nondilated transverse and left colon. Multiple phleboliths are seen in the right paramidline abdomen and along the pelvic sidewalls bilaterally. Visualized bony anatomy unremarkable. IMPRESSION: No findings to suggest bowel obstruction. Electronically Signed   By: Misty [name] M.D.   On: 08/13/2020 10:36

## 2020-08-13 NOTE — Progress Notes (Signed)
Patient was seen by respiratory therapist to have cpap put on tonight to help with breathing as ordered by day shift physician. Patient refused to have the cpap on. Will notify and address day shift nurse in the morning. Charge RN Vicente Males on nights made aware. VSS. Patient in no distress, will continue to monitor.

## 2020-08-13 NOTE — Evaluation (Signed)
Physical Therapy Evaluation Patient Details Name: Heidi [name] MRN: [id number] DOB: [birth year] Today's Date: 08/13/2020   History of Present Illness  Pt is a 70 y.o. female who tested (+) COVID-19 on 08/01/20, now admitted 08/09/20 with AMS, found to be hypoxic. Workup for acute hypoxic respiratory failure due to COVID-19 PNA, AKI. Head CT showing possible MCA infarct; MRI negative for acute injury. PMH includes asthma, HF, HTN, CKD 3, COPD, DM, obesity.    Clinical Impression  Pt presents with an overall decrease in functional mobility secondary to above. Pt inconsistent historian with flat affect, reports mod indep with rollator and having increased difficulty with mobility at home; lives with daughter who is unable to physically assist. Today, pt requiring up to min-modA to maintain balance with standing activity. Pt limited by generalized weakness, decreased activity tolerance, impaired balance strategies/postural reactions and cognitive impairment, including apparent slowed processing, decreased attention and poor awareness; at high risk for falls. Pt would benefit from continued acute PT services to maximize functional mobility and independence prior to d/c with SNF-level therapies pending progress and support available upon return home.  SpO2 87-88% on 8L O2 HFNC; HR 84    Follow Up Recommendations SNF;Home health PT;Supervision for mobility/OOB (pending support & progress)    Equipment Recommendations   (TBD)    Recommendations for Other Services       Precautions / Restrictions Precautions Precautions: Fall;Other (comment) Precaution Comments: Watch SpO2 Restrictions Weight Bearing Restrictions: No      Mobility  Bed Mobility Overal bed mobility: Needs Assistance Bed Mobility: Sit to Supine       Sit to supine: Supervision   General bed mobility comments: Supervision for safety and cues to complete tasks; no physical assist required    Transfers Overall transfer  level: Needs assistance Equipment used: None;1 person hand held assist Transfers: Sit to/from Stand Sit to Stand: Min assist;Mod assist         General transfer comment: Increased time and effort, reliant on momentum and minA to assist trunk elevation; pt with posterior LOB requiring modA for safe return to sit; additional sit<>stands from bed and recliner with min guard to minA for balance, stability improved with HHA  Ambulation/Gait Ambulation/Gait assistance: Min assist Gait Distance (Feet): 4 Feet Assistive device: None;1 person hand held assist Gait Pattern/deviations: Step-to pattern Gait velocity: Decreased   General Gait Details: Pivotal steps from bed to recliner and 2x more bouts of marching in place with minA for HHA for stability; seated rest between activity bouts  Stairs            Wheelchair Mobility    Modified Rankin (Stroke Patients Only)       Balance Overall balance assessment: Needs assistance   Sitting balance-Leahy Scale: Fair     Standing balance support: Single extremity supported;No upper extremity supported;During functional activity Standing balance-Leahy Scale: Poor Standing balance comment: LOB without UE support; stability improved with at least single UE support                             Pertinent Vitals/Pain Pain Assessment: No/denies pain    Home Living Family/patient expects to be discharged to:: Private residence Living Arrangements: Children Available Help at Discharge: Family;Available PRN/intermittently Type of Home: Apartment Home Access: Stairs to enter   Entrance Stairs-Number of Steps: Flight Home Layout: One level Home Equipment: Environmental consultant - 4 wheels;Cane - single point Additional Comments: Pt poor historian, from chart  review (PT/OT Eval 08/2019) - daughter disabled and cannot provide physical assist    Prior Function Level of Independence: Needs assistance         Comments: Pt reports mod indep  ambulating with cane or rollator (seat broken); has difficulty getting up/down steps into apartment. Drives seldomly. Cooks some, daughter assists with cleaning     Hand Dominance        Extremity/Trunk Assessment   Upper Extremity Assessment Upper Extremity Assessment: Generalized weakness;Difficult to assess due to impaired cognition    Lower Extremity Assessment Lower Extremity Assessment: Generalized weakness;Difficult to assess due to impaired cognition       Communication   Communication: Receptive difficulties;Expressive difficulties  Cognition Arousal/Alertness: Awake/alert (fatigued?) Behavior During Therapy: Flat affect Overall Cognitive Status: No family/caregiver present to determine baseline cognitive functioning Area of Impairment: Attention;Following commands;Safety/judgement;Awareness;Problem solving                   Current Attention Level: Sustained   Following Commands: Follows one step commands inconsistently;Follows one step commands with increased time Safety/Judgement: Decreased awareness of safety;Decreased awareness of deficits Awareness: Intellectual;Emergent Problem Solving: Slow processing;Decreased initiation;Difficulty sequencing;Requires verbal cues General Comments: Pt very slow to answer questions and follow commands at times, requiring frequent repetition of cues to complete simple tasks; quick to lose attention, requiring her name repeated to regain attention during conversation and mobility tasks      General Comments General comments (skin integrity, edema, etc.): SpO2 87-88% on 8L O2 HFNC, HR 84. Further mobility limited by pt needing to return to bed for chest/abdominal xray    Exercises     Assessment/Plan    PT Assessment Patient needs continued PT services  PT Problem List Decreased strength;Decreased activity tolerance;Decreased balance;Decreased mobility;Decreased cognition;Decreased knowledge of use of  DME;Cardiopulmonary status limiting activity       PT Treatment Interventions DME instruction;Gait training;Stair training;Functional mobility training;Therapeutic activities;Therapeutic exercise;Balance training;Cognitive remediation;Patient/family education    PT Goals (Current goals can be found in the Care Plan section)  Acute Rehab PT Goals Patient Stated Goal: None stated PT Goal Formulation: With patient Time For Goal Achievement: 08/27/20 Potential to Achieve Goals: Fair    Frequency Min 3X/week   Barriers to discharge Decreased caregiver support;Inaccessible home environment      Co-evaluation               AM-PAC PT "6 Clicks" Mobility  Outcome Measure Help needed turning from your back to your side while in a flat bed without using bedrails?: A Little Help needed moving from lying on your back to sitting on the side of a flat bed without using bedrails?: A Little Help needed moving to and from a bed to a chair (including a wheelchair)?: A Little Help needed standing up from a chair using your arms (e.g., wheelchair or bedside chair)?: A Little Help needed to walk in hospital room?: A Little Help needed climbing 3-5 steps with a railing? : A Lot 6 Click Score: 17    End of Session   Activity Tolerance: Patient tolerated treatment well;Patient limited by fatigue;Other (comment) Patient left: in bed;with call bell/phone within reach;with bed alarm set;Other (comment) (with xray present) Nurse Communication: Mobility status PT Visit Diagnosis: Other abnormalities of gait and mobility (R26.89);Muscle weakness (generalized) (M62.81)    Time: 7782-4235 PT Time Calculation (min) (ACUTE ONLY): 18 min   Charges:   PT Evaluation $PT Eval Moderate Complexity: 1 Mod     Mabeline Caras, PT, DPT Acute Rehabilitation  Services  Pager [phone] Office [place] 08/13/2020, 10:43 AM

## 2020-08-13 NOTE — Progress Notes (Signed)
CSW received snf consult. Unable to reach patient; will follow up with her tomorrow.   Mireyah Chervenak LCSW

## 2020-08-14 ENCOUNTER — Inpatient Hospital Stay (HOSPITAL_COMMUNITY): Payer: Medicare HMO

## 2020-08-14 DIAGNOSIS — N179 Acute kidney failure, unspecified: Secondary | ICD-10-CM

## 2020-08-14 DIAGNOSIS — J1282 Pneumonia due to coronavirus disease 2019: Secondary | ICD-10-CM

## 2020-08-14 DIAGNOSIS — U071 COVID-19: Secondary | ICD-10-CM | POA: Diagnosis not present

## 2020-08-14 DIAGNOSIS — N189 Chronic kidney disease, unspecified: Secondary | ICD-10-CM | POA: Diagnosis not present

## 2020-08-14 DIAGNOSIS — G9349 Other encephalopathy: Secondary | ICD-10-CM | POA: Diagnosis not present

## 2020-08-14 LAB — CBC WITH DIFFERENTIAL/PLATELET
Abs Immature Granulocytes: 0 10*3/uL (ref 0.00–0.07)
Basophils Absolute: 0 10*3/uL (ref 0.0–0.1)
Basophils Relative: 0 %
Eosinophils Absolute: 0 10*3/uL (ref 0.0–0.5)
Eosinophils Relative: 0 %
HCT: 42.8 % (ref 36.0–46.0)
Hemoglobin: 12.9 g/dL (ref 12.0–15.0)
Lymphocytes Relative: 18 %
Lymphs Abs: 1 10*3/uL (ref 0.7–4.0)
MCH: 26.9 pg (ref 26.0–34.0)
MCHC: 30.1 g/dL (ref 30.0–36.0)
MCV: 89.2 fL (ref 80.0–100.0)
Monocytes Absolute: 0.2 10*3/uL (ref 0.1–1.0)
Monocytes Relative: 4 %
Neutro Abs: 4.5 10*3/uL (ref 1.7–7.7)
Neutrophils Relative %: 78 %
Platelets: 263 10*3/uL (ref 150–400)
RBC: 4.8 MIL/uL (ref 3.87–5.11)
RDW: 14.3 % (ref 11.5–15.5)
WBC: 5.8 10*3/uL (ref 4.0–10.5)
nRBC: 0.5 % — ABNORMAL HIGH (ref 0.0–0.2)
nRBC: 3 /100 WBC — ABNORMAL HIGH

## 2020-08-14 LAB — BRAIN NATRIURETIC PEPTIDE: B Natriuretic Peptide: 130.4 pg/mL — ABNORMAL HIGH (ref 0.0–100.0)

## 2020-08-14 LAB — GLUCOSE, CAPILLARY
Glucose-Capillary: 288 mg/dL — ABNORMAL HIGH (ref 70–99)
Glucose-Capillary: 299 mg/dL — ABNORMAL HIGH (ref 70–99)
Glucose-Capillary: 129 mg/dL — ABNORMAL HIGH (ref 70–99)
Glucose-Capillary: 85 mg/dL (ref 70–99)

## 2020-08-14 LAB — C-REACTIVE PROTEIN: CRP: 0.7 mg/dL (ref <1.0)

## 2020-08-14 LAB — MAGNESIUM: Magnesium: 2.2 mg/dL (ref 1.7–2.4)

## 2020-08-14 LAB — COMPREHENSIVE METABOLIC PANEL
ALT: 24 U/L (ref 0–44)
AST: 33 U/L (ref 15–41)
Albumin: 2.8 g/dL — ABNORMAL LOW (ref 3.5–5.0)
Alkaline Phosphatase: 44 U/L (ref 38–126)
Anion gap: 12 (ref 5–15)
BUN: 32 mg/dL — ABNORMAL HIGH (ref 8–23)
CO2: 32 mmol/L (ref 22–32)
Calcium: 8.8 mg/dL — ABNORMAL LOW (ref 8.9–10.3)
Chloride: 100 mmol/L (ref 98–111)
Creatinine, Ser: 1.09 mg/dL — ABNORMAL HIGH (ref 0.44–1.00)
GFR, Estimated: 55 mL/min — ABNORMAL LOW (ref >60)
Glucose, Bld: 191 mg/dL — ABNORMAL HIGH (ref 70–99)
Potassium: 4.5 mmol/L (ref 3.5–5.1)
Sodium: 144 mmol/L (ref 135–145)
Total Bilirubin: 1.2 mg/dL (ref 0.3–1.2)
Total Protein: 5.7 g/dL — ABNORMAL LOW (ref 6.5–8.1)

## 2020-08-14 LAB — D-DIMER, QUANTITATIVE: D-Dimer, Quant: 1.2 ug/mL-FEU — ABNORMAL HIGH (ref 0.00–0.50)

## 2020-08-14 LAB — PROCALCITONIN: Procalcitonin: <0.1 ng/mL

## 2020-08-14 MED ORDER — FUROSEMIDE 10 MG/ML IJ SOLN
40.0000 mg | Freq: Once | INTRAMUSCULAR | Status: AC
  Administered 2020-08-14: 40 mg via INTRAVENOUS
  Filled 2020-08-14: qty 4

## 2020-08-14 NOTE — Progress Notes (Signed)
Inpatient Diabetes Program Recommendations  AACE/ADA: New Consensus Statement on Inpatient Glycemic Control (2015)  Target Ranges:  Prepandial:   less than 140 mg/dL      Peak postprandial:   less than 180 mg/dL (1-2 hours)      Critically ill patients:  140 - 180 mg/dL   Lab Results  Component Value Date   GLUCAP 288 (H) 08/14/2020   HGBA1C 9.9 (A) 05/14/2020    Review of Glycemic Control Results for Heidi [name], Heidi [name] (MRN [id number]) as of 08/14/2020 11:09  Ref. Range 08/13/2020 17:28 08/13/2020 17:48 08/13/2020 20:27 08/14/2020 07:59  Glucose-Capillary Latest Ref Range: 70 - 99 mg/dL 62 (L) 84 190 (H) 288 (H)   Diabetes history: Type 2 DM Outpatient Diabetes medications: Novolog 70/30- 70 units QAM/45 units QPM Current orders for Inpatient glycemic control: Novolog 70/30 50 units BID, Novolog 0-15 units TID, Novolog 0-5 units QHS Solumedrol 40 mg BID  Inpatient Diabetes Program Recommendations:    Noted hypoglycemia yesterday of 62 mg/dL. Due to mild hypoglycemia, patient missed QPM dose of 70/30, thus explaining increase in glucose trends this AM.  Given CKD, may consider reducing correction ton Novolog 0-9 units TID.   Thanks, Bronson Curb, MSN, RNC-OB Diabetes Coordinator [phone] (8a-5p)

## 2020-08-14 NOTE — Progress Notes (Signed)
PROGRESS NOTE                                                                                                                                                                                                             Patient Demographics:    Heidi [name], is a 70 y.o. female, DOB - January 09, 1951, CHE:[id number]  Outpatient Primary MD for the patient is Ladell Pier, MD    LOS - 4  Admit date - 08/09/2020    Chief Complaint  Patient presents with  . Altered Mental Status       Brief Narrative (HPI from H&P)  70 year old lady prior history of asthma, diastolic heart failure, hypertension, stage IIIa CKD, insulin-dependent diabetes mellitus, morbid obesity presents with shortness of breath and chest pain. Chest x-ray shows hazy bilateral opacities in the lung bases concerning for atypical infectious process such as viral pneumonia.   Subjective:   Patient in bed, appears comfortable, denies any headache, no fever, no chest pain or pressure, no shortness of breath , feels her belly is full but no pain. No focal weakness.    Assessment  & Plan :    Acute Hypoxic Resp. Failure due to Acute Covid 19 Viral Pneumonitis + Acute on Chronic Diastolic CHF - - she unfortunately not vaccinated for Covid  - she seems to have incurred moderate to severe parenchymal lung injury.  Appears  to be with worsening hypoxia, she is on 7 L this morning. -She is treated with IV steroids. - She is being treated with IV remdesivir . -Received Actemra 2/5 she has been started on steroids and Remdesivir combination, continue to monitor, she has consented for Actemra use if needed.  Lasix as needed for CHF.  Encouraged the patient to sit up in chair in the daytime use I-S and flutter valve for pulmonary toiletry and then prone in bed when at night.  Will advance activity and titrate down oxygen as possible.  Actemra/Baricitinib  off label use -  patient was told that if COVID-19 pneumonitis gets worse we might potentially use Actemra off label, patient denies any known history of active diverticulitis, tuberculosis or hepatitis, understands the risks and benefits and wants to proceed with Actemra treatment if required.   SpO2: 90 % O2 Flow Rate (L/min): 6 L/min FiO2 (%): 100 %  Recent Labs  Lab 08/09/20 2315 08/09/20 2328 08/10/20 0618 08/11/20 0500 08/12/20 0410 08/12/20 0839 08/13/20 0108 08/14/20 0129  WBC  --   --   --  6.2 6.0  --  5.9 5.8  HGB  --    < > 13.3 12.1 12.7  --  12.8 12.9  HCT  --    < > 39.0 39.0 42.4  --  43.0 42.8  PLT  --   --   --  215 238  --  252 263  CRP 12.1*  --   --  6.9* 2.6* 2.1* 1.1* 0.7  BNP  --   --   --   --   --  28.9 419.3* 130.4*  DDIMER 1.33*  --   --  1.52* 1.00* 1.03* 0.92* 1.20*  PROCALCITON <0.10  --   --   --   --  <0.10 <0.10 <0.10  AST 42*  --   --  39  --   --  30 33  ALT 28  --   --  29  --   --  27 24  ALKPHOS 65  --   --  55  --   --  48 44  BILITOT 0.4  --   --  0.4  --   --  0.6 1.2  ALBUMIN 3.4*  --   --  2.8*  --   --  2.7* 2.8*  LATICACIDVEN 2.1*  --   --   --   --   --   --   --    < > = values in this interval not displayed.    2.  Acute on chronic diastolic CHF EF 61% on recent echocardiogram. Continue beta-blocker,  -She is on IV Lasix as needed, will give 40 mg of IV Lasix especially in the setting of worsening hypoxia.  3.  Breast nodule on CT angiogram of May 2021 -Patient had mammogram done 05/01/2020, with evidence of multiple benign bilateral breast nodules, no evidence of malignancy, with recommendation to repeat mammogram in 1 year, have discussed this with the patient, she is aware of these findings and the need to repeat mammogram.  4.  CT head showing possible MCA nonspecific finding.  MRA brain toady.  No focal neurological deficits.  5.  Thyroid nodule.  TSH normal, has had biopsy done in the past and deemed to be benign.  Follow with PCP.  6.   Dyslipidemia.  On Lipitor.  7.  AKI on CKD 2. AKI much improved, baseline creatinine around 1.2.  8.   Morbid obesity with underlying OSA.  BMI of 40, follow with PCP for weight loss, counseled on compliance with nighttime CPAP in the hospital, per pulmonary who had seen the patient initially will benefit from nighttime CPAP.  Outpatient sleep study pulmonary follow-up.  9. DM type II.  On Lantus and sliding scale monitor and adjust.  Labile, with CBG of 62 yesterday, so I will hold on increasing her insulin  Lab Results  Component Value Date   HGBA1C 9.9 (A) 05/14/2020   CBG (last 3)  Recent Labs    08/13/20 2027 08/14/20 0759 08/14/20 1213  GLUCAP 190* 288* 299*         Condition - Extremely Guarded  Family Communication  :  Eloy End [phone] - 08/12/20, have discussed at length with the patient today, and answered all her questions.  Code Status :  Full  Consults  :  PCCM   Procedures  :  CTA - 1. Pulmonary arterial opacification is moderate. No large pulmonary emboli. Small peripheral emboli might be inapparent, but none are seen. 2. Cardiomegaly. Some coronary artery calcification. 3. Widespread patchy pulmonary infiltrates consistent with the clinical history of viral pneumonia. Fairly dense consolidation in the left lower lobe could possibly be superimposed bacterial pneumonia.  CT Head - no acute changes, questionable MCA nonspecific finding.  MRA -       PUD Prophylaxis :  PPI  Disposition Plan  :    Status is: Inpatient  Remains inpatient appropriate because:IV treatments appropriate due to intensity of illness or inability to take PO   Dispo: The patient is from: Home              Anticipated d/c is to: Home              Anticipated d/c date is: > 3 days              Patient currently is not medically stable to d/c.   Difficult to place patient No  DVT Prophylaxis  :  Lovenox    Lab Results  Component Value Date   PLT 263 08/14/2020     Diet :  Diet Order            Diet Carb Modified Fluid consistency: Thin; Room service appropriate? Yes  Diet effective now                  Inpatient Medications  Scheduled Meds: . aerochamber plus with mask  1 each Other Once  . aspirin  81 mg Oral Daily  . atenolol  25 mg Oral Daily  . atorvastatin  40 mg Oral QPM  . chlorhexidine  15 mL Mouth Rinse BID  . enoxaparin (LOVENOX) injection  50 mg Subcutaneous Q24H  . insulin aspart  0-15 Units Subcutaneous TID WC  . insulin aspart  0-5 Units Subcutaneous QHS  . insulin aspart protamine- aspart  50 Units Subcutaneous BID WC  . mouth rinse  15 mL Mouth Rinse q12n4p  . methylPREDNISolone (SOLU-MEDROL) injection  40 mg Intravenous Q12H  . mometasone-formoterol  2 puff Inhalation BID  . olopatadine  1 drop Both Eyes BID  . pantoprazole  40 mg Oral QHS  . topiramate  75 mg Oral BID   Continuous Infusions:  PRN Meds:.acetaminophen, albuterol, guaiFENesin-dextromethorphan, loperamide, ondansetron (ZOFRAN) IV  Antibiotics  :    Anti-infectives (From admission, onward)   Start     Dose/Rate Route Frequency Ordered Stop   08/11/20 1000  remdesivir 100 mg in sodium chloride 0.9 % 100 mL IVPB       "Followed by" Linked Group Details   100 mg 200 mL/hr over 30 Minutes Intravenous Daily 08/10/20 1614 08/14/20 1007   08/10/20 1700  remdesivir 200 mg in sodium chloride 0.9% 250 mL IVPB       "Followed by" Linked Group Details   200 mg 580 mL/hr over 30 Minutes Intravenous Once 08/10/20 1614 08/10/20 2051   08/10/20 1600  cefTRIAXone (ROCEPHIN) 2 g in sodium chloride 0.9 % 100 mL IVPB  Status:  Discontinued        2 g 200 mL/hr over 30 Minutes Intravenous Every 24 hours 08/10/20 0513 08/10/20 0839   08/10/20 0600  azithromycin (ZITHROMAX) 250 mg in dextrose 5 % 125 mL IVPB  Status:  Discontinued        250 mg 125 mL/hr over 60 Minutes Intravenous Every 24 hours 08/10/20 0514 08/10/20 0839  08/10/20 0515  doxycycline  (VIBRAMYCIN) 100 mg in sodium chloride 0.9 % 250 mL IVPB  Status:  Discontinued        100 mg 125 mL/hr over 120 Minutes Intravenous Every 12 hours 08/10/20 0513 08/10/20 0514   08/10/20 0215  cefTRIAXone (ROCEPHIN) 1 g in sodium chloride 0.9 % 100 mL IVPB        1 g 200 mL/hr over 30 Minutes Intravenous  Once 08/10/20 0202 08/10/20 0405        Dawood Elgergawy M.D on 08/14/2020 at 3:11 PM  To page go to [email or web address]   [facility] -  Office  [phone]    See all Orders from today for further details    Objective:   Vitals:   08/14/20 0800 08/14/20 0927 08/14/20 1145 08/14/20 1151  BP: 125/63   (!) 110/44  Pulse: 80   74  Resp: 20   20  Temp: 98 F (36.7 C)   98.2 F (36.8 C)  TempSrc: Oral   Oral  SpO2: 93% 94% (!) 84% 90%  Weight:      Height:        Wt Readings from Last 3 Encounters:  08/09/20 107.5 kg  08/01/20 107.5 kg  06/13/20 107.9 kg     Intake/Output Summary (Last 24 hours) at 08/14/2020 1511 Last data filed at 08/14/2020 1007 Gross per 24 hour  Intake 220 ml  Output -  Net 220 ml     Physical Exam  Awake Alert, Oriented X 3, No new F.N deficits, Normal affect Symmetrical Chest wall movement, Good air movement bilaterally, she is with a scattered Rales RRR,No Gallops,Rubs or new Murmurs, No Parasternal Heave +ve B.Sounds, Abd Soft, No tenderness, No rebound - guarding or rigidity. No Cyanosis, Clubbing or edema, No new Rash or bruise      Data Review:    CBC Recent Labs  Lab 08/10/20 0618 08/11/20 0500 08/12/20 0410 08/13/20 0108 08/14/20 0129  WBC  --  6.2 6.0 5.9 5.8  HGB 13.3 12.1 12.7 12.8 12.9  HCT 39.0 39.0 42.4 43.0 42.8  PLT  --  215 238 252 263  MCV  --  90.3 90.0 90.1 89.2  MCH  --  28.0 27.0 26.8 26.9  MCHC  --  31.0 30.0 29.8* 30.1  RDW  --  14.7 14.6 14.4 14.3  LYMPHSABS  --  1.0  --  1.0 1.0  MONOABS  --  0.3  --  0.3 0.2  EOSABS  --  0.0  --  0.0 0.0  BASOSABS  --  0.0  --  0.0 0.0    Recent Labs   Lab 08/09/20 2315 08/09/20 2328 08/10/20 0500 08/10/20 0618 08/11/20 0500 08/12/20 0410 08/12/20 0839 08/13/20 0108 08/14/20 0129  NA 136   < >  --  137 144 144  --  145 144  K 3.8   < >  --  4.4 3.9 3.7  --  3.9 4.5  CL 94*  --   --   --  99 100  --  100 100  CO2 29  --   --   --  33* 33*  --  34* 32  GLUCOSE 379*  --   --   --  114* 183*  --  104* 191*  BUN 28*  --   --   --  30* 29*  --  33* 32*  CREATININE 1.43*  --   --   --  1.25* 1.05*  --  1.15* 1.09*  CALCIUM 9.1  --   --   --  9.0 9.1  --  9.3 8.8*  AST 42*  --   --   --  39  --   --  30 33  ALT 28  --   --   --  29  --   --  27 24  ALKPHOS 65  --   --   --  55  --   --  48 44  BILITOT 0.4  --   --   --  0.4  --   --  0.6 1.2  ALBUMIN 3.4*  --   --   --  2.8*  --   --  2.7* 2.8*  MG  --   --  2.6*  --   --   --  2.3 2.3 2.2  CRP 12.1*  --   --   --  6.9* 2.6* 2.1* 1.1* 0.7  DDIMER 1.33*  --   --   --  1.52* 1.00* 1.03* 0.92* 1.20*  PROCALCITON <0.10  --   --   --   --   --  <0.10 <0.10 <0.10  LATICACIDVEN 2.1*  --   --   --   --   --   --   --   --   BNP  --   --   --   --   --   --  28.9 419.3* 130.4*   < > = values in this interval not displayed.    ------------------------------------------------------------------------------------------------------------------ No results for input(s): CHOL, HDL, LDLCALC, TRIG, CHOLHDL, LDLDIRECT in the last 72 hours.  Lab Results  Component Value Date   HGBA1C 9.9 (A) 05/14/2020   ------------------------------------------------------------------------------------------------------------------ No results for input(s): TSH, T4TOTAL, T3FREE, THYROIDAB in the last 72 hours.  Invalid input(s): FREET3  Cardiac Enzymes No results for input(s): CKMB, TROPONINI, MYOGLOBIN in the last 168 hours.  Invalid input(s): CK ------------------------------------------------------------------------------------------------------------------    Component Value Date/Time   BNP 130.4 (H)  08/14/2020 0129    Micro Results Recent Results (from the past 240 hour(s))  Blood Culture (routine x 2)     Status: None (Preliminary result)   Collection Time: 08/09/20 11:18 PM   Specimen: BLOOD RIGHT FOREARM  Result Value Ref Range Status   Specimen Description BLOOD RIGHT FOREARM  Final   Special Requests   Final    BOTTLES DRAWN AEROBIC AND ANAEROBIC Blood Culture adequate volume   Culture   Final    NO GROWTH 4 DAYS Performed at [facility], [address], [place], [place] [address]    Report Status PENDING  Incomplete  Blood Culture (routine x 2)     Status: None (Preliminary result)   Collection Time: 08/09/20 11:50 PM   Specimen: BLOOD LEFT FOREARM  Result Value Ref Range Status   Specimen Description BLOOD LEFT FOREARM  Final   Special Requests   Final    BOTTLES DRAWN AEROBIC AND ANAEROBIC Blood Culture adequate volume   Culture   Final    NO GROWTH 4 DAYS Performed at [facility], [place] [address], [place], [place] [address]    Report Status PENDING  Incomplete  MRSA PCR Screening     Status: None   Collection Time: 08/11/20 11:56 PM   Specimen: Nasal Mucosa; Nasopharyngeal  Result Value Ref Range Status   MRSA by PCR NEGATIVE NEGATIVE Final    Comment:        The  GeneXpert MRSA Assay (FDA approved for NASAL specimens only), is one component of a comprehensive MRSA colonization surveillance program. It is not intended to diagnose MRSA infection nor to guide or monitor treatment for MRSA infections. Performed at [facility], [place] [address], [place], [place] [address]     Radiology Reports DG Chest 2 View  Addendum Date: 08/01/2020   ADDENDUM REPORT: 08/01/2020 15:09 ADDENDUM: Additional history: COPD, asthma Electronically Signed   By: Lavonia Dana M.D.   On: 08/01/2020 15:09   Result Date: 08/01/2020 CLINICAL DATA:  Shortness of breath, sore throat, fever and headache for 3 days, of both grandchildren tested positive for COVID-19  yesterday EXAM: CHEST - 2 VIEW COMPARISON:  02/28/2020 FINDINGS: Enlargement of cardiac silhouette. Mediastinal contours and pulmonary vascularity normal. Minimal linear atelectasis at lingula and RIGHT base. Central peribronchial thickening without definite pulmonary infiltrate, pleural effusion, or pneumothorax. Bones demineralized. IMPRESSION: Enlargement of cardiac silhouette with minimal bibasilar atelectasis. Electronically Signed: By: Lavonia Dana M.D. On: 08/01/2020 15:06   CT Head Wo Contrast  Result Date: 08/10/2020 CLINICAL DATA:  Mental status changes.  Coronavirus infection. EXAM: CT HEAD WITHOUT CONTRAST TECHNIQUE: Contiguous axial images were obtained from the base of the skull through the vertex without intravenous contrast. COMPARISON:  01/27/2018 FINDINGS: Brain: No evidence of accelerated atrophy. No evidence of acute infarction, intra-axial mass lesion, hemorrhage, hydrocephalus or extra-axial collection 1 cm calcified meningioma at the right frontoparietal convexity, not significant. Vascular: Question a hyperdense MCA branch on the left in the sylvian fissure region. This is not a definite finding. Skull: Normal Sinuses/Orbits: Clear Other: None IMPRESSION: 1. No acute brain parenchymal finding by CT. Question a hyperdense MCA branch on the left in the Sylvian fissure region. This is not a definite finding. If there is clinical concern about a left MCA territory acute infarction, consider CT angiography. 2. 1 cm calcified meningioma at the right frontoparietal convexity, not significant. Electronically Signed   By: Nelson Chimes M.D.   On: 08/10/2020 02:52   CT Angio Chest PE W and/or Wo Contrast  Result Date: 08/10/2020 CLINICAL DATA:  Coronavirus pneumonia.  Question pulmonary emboli. EXAM: CT ANGIOGRAPHY CHEST WITH CONTRAST TECHNIQUE: Multidetector CT imaging of the chest was performed using the standard protocol during bolus administration of intravenous contrast. Multiplanar CT image  reconstructions and MIPs were obtained to evaluate the vascular anatomy. CONTRAST:  20mL OMNIPAQUE IOHEXOL 350 MG/ML SOLN COMPARISON:  Chest radiography yesterday. FINDINGS: Cardiovascular: Cardiomegaly. No pericardial fluid. Some coronary artery calcification. No visible thoracic aortic atherosclerotic calcification. Pulmonary arterial opacification is moderate. There are no large pulmonary emboli. Small peripheral emboli might be inapparent, but none are seen. Mediastinum/Nodes: No mass or lymphadenopathy. Lungs/Pleura: No pleural effusion. Widespread patchy pulmonary infiltrates consistent with the clinical history of viral pneumonia. Fairly dense consolidation in the left lower lobe. There could possibly be superimposed bacterial pneumonia. Upper Abdomen: Left renal cyst.  Not completely evaluated. Musculoskeletal: Negative Review of the MIP images confirms the above findings. IMPRESSION: 1. Pulmonary arterial opacification is moderate. No large pulmonary emboli. Small peripheral emboli might be inapparent, but none are seen. 2. Cardiomegaly. Some coronary artery calcification. 3. Widespread patchy pulmonary infiltrates consistent with the clinical history of viral pneumonia. Fairly dense consolidation in the left lower lobe could possibly be superimposed bacterial pneumonia. Electronically Signed   By: Nelson Chimes M.D.   On: 08/10/2020 01:48   MR ANGIO HEAD WO CONTRAST  Result Date: 08/14/2020 CLINICAL DATA:  Transient ischemic attack.  Encephalopathy. EXAM: MRA HEAD WITHOUT CONTRAST TECHNIQUE: Angiographic images of the Circle of Willis were obtained using MRA technique without intravenous contrast. COMPARISON:  None. FINDINGS: POSTERIOR CIRCULATION: --Vertebral arteries: Normal --Inferior cerebellar arteries: Normal. --Basilar artery: Normal. --Superior cerebellar arteries: Normal. --Posterior cerebral arteries: Normal. ANTERIOR CIRCULATION: --Intracranial internal carotid arteries: Normal. --Anterior  cerebral arteries (ACA): Normal. --Middle cerebral arteries (MCA): Normal. ANATOMIC VARIANTS: Both posterior communicating arteries are patent IMPRESSION: Normal intracranial MRA. Electronically Signed   By: Ulyses Jarred M.D.   On: 08/14/2020 01:39   MR BRAIN WO CONTRAST  Result Date: 08/10/2020 CLINICAL DATA:  Mental status change with unknown cause. EXAM: MRI HEAD WITHOUT CONTRAST TECHNIQUE: Multiplanar, multiecho pulse sequences of the brain and surrounding structures were obtained without intravenous contrast. COMPARISON:  01/27/2018 FINDINGS: Brain: No acute infarction, hemorrhage, hydrocephalus, extra-axial collection or mass lesion. Mild for age chronic small vessel ischemic type change in the periventricular white matter and pons. Age normal brain volume Vascular: Normal flow voids Skull and upper cervical spine: Normal marrow signal Sinuses/Orbits: Bilateral cataract resection.  No acute finding IMPRESSION: No acute finding or cause for symptoms. Electronically Signed   By: Monte Fantasia M.D.   On: 08/10/2020 10:10   DG Chest Port 1 View  Result Date: 08/13/2020 CLINICAL DATA:  Shortness of breath, cough, COVID EXAM: PORTABLE CHEST 1 VIEW COMPARISON:  08/12/2020 FINDINGS: Bilateral pulmonary opacities remain present with similar lung aeration. No significant pleural effusion. No pneumothorax. Stable cardiomediastinal contours. IMPRESSION: Similar appearance of bilateral pulmonary opacities. Electronically Signed   By: Macy Mis M.D.   On: 08/13/2020 10:27   DG Chest Port 1 View  Result Date: 08/12/2020 CLINICAL DATA:  Hypoxia.  COVID. EXAM: PORTABLE CHEST 1 VIEW COMPARISON:  08/09/2020 FINDINGS: 0520 hours. The cardio pericardial silhouette is enlarged. Low volume film with patchy bilateral airspace disease superimposed on underlying diffuse interstitial coarsening. No substantial pleural effusion. No pneumothorax. Telemetry leads overlie the chest. IMPRESSION: No substantial interval  change. Electronically Signed   By: Misty [name] M.D.   On: 08/12/2020 05:27   DG Chest Port 1 View  Result Date: 08/09/2020 CLINICAL DATA:  70 year old female with shortness of breath and cough. Positive COVID-19 EXAM: PORTABLE CHEST 1 VIEW COMPARISON:  Chest radiograph dated 08/01/2020 FINDINGS: Bilateral confluent and streaky airspace opacities consistent with multilobar pneumonia and in keeping with COVID-19. No pleural effusion pneumothorax. Stable cardiomegaly. No acute osseous pathology. IMPRESSION: Multilobar pneumonia. Electronically Signed   By: Anner Crete M.D.   On: 08/09/2020 22:57   DG Abd Portable 1V  Result Date: 08/13/2020 CLINICAL DATA:  Nausea. EXAM: PORTABLE ABDOMEN - 1 VIEW COMPARISON:  None. FINDINGS: No gaseous small moderate gaseous distention of the stomach noted bowel dilatation. Air is seen scattered along the nondilated transverse and left colon. Multiple phleboliths are seen in the right paramidline abdomen and along the pelvic sidewalls bilaterally. Visualized bony anatomy unremarkable. IMPRESSION: No findings to suggest bowel obstruction. Electronically Signed   By: Misty [name] M.D.   On: 08/13/2020 10:36

## 2020-08-14 NOTE — Evaluation (Signed)
Occupational Therapy Evaluation Patient Details Name: Heidi [name] MRN: [id number] DOB: [birth year] Today's Date: 08/14/2020    History of Present Illness Pt is a 70 y.o. female who tested (+) COVID-19 on 08/01/20, now admitted 08/09/20 with AMS, found to be hypoxic. Workup for acute hypoxic respiratory failure due to COVID-19 PNA, AKI. Head CT showing possible MCA infarct; MRI negative for acute injury. PMH includes asthma, HF, HTN, CKD 3, COPD, DM, obesity.   Clinical Impression   Pt admitted with above. She demonstrates the below listed deficits and will benefit from continued OT to maximize safety and independence with BADLs.  PT presents to OT with generalized weakness, impaired balance, decreased activity tolerance, and significant cognitive deficits - attempted to administer the Short Blessed Test, but pt unable to answer any of the questions).  She currently requires max - total A for all aspects of ADLs and min A for functional transfers with max prompting.  PTA, it appears she lived with her daughter and was independent with ADLs.   Recommend SNF level rehab at discharge.       Follow Up Recommendations  SNF;Supervision/Assistance - 24 hour    Equipment Recommendations  None recommended by OT    Recommendations for Other Services       Precautions / Restrictions Precautions Precautions: Fall;Other (comment) Precaution Comments: Watch SpO2      Mobility Bed Mobility Overal bed mobility: Needs Assistance Bed Mobility: Supine to Sit;Sit to Supine     Supine to sit: Mod assist Sit to supine: Mod assist   General bed mobility comments: assist to initiate movement  and to guide LEs off and on the bed and to lift and lower trunk.  Pt required max cues to sequence task    Transfers Overall transfer level: Needs assistance Equipment used: Rolling walker (2 wheeled) Transfers: Sit to/from Stand Sit to Stand: Min assist         General transfer comment: min A to boost  into standing.  Min A to side step up the edge of the bed    Balance Overall balance assessment: Needs assistance   Sitting balance-Leahy Scale: Fair Sitting balance - Comments: able to maintain static sitting EOB with min guard assist   Standing balance support: Bilateral upper extremity supported Standing balance-Leahy Scale: Poor Standing balance comment: requires UE support                           ADL either performed or assessed with clinical judgement   ADL Overall ADL's : Needs assistance/impaired Eating/Feeding: Maximal assistance;Bed level   Grooming: Wash/dry hands;Wash/dry face;Oral care;Brushing hair;Total assistance;Sitting   Upper Body Bathing: Total assistance;Sitting   Lower Body Bathing: Total assistance;Sit to/from stand   Upper Body Dressing : Total assistance;Sitting   Lower Body Dressing: Total assistance;Sit to/from stand   Toilet Transfer: Minimal assistance;Squat-pivot;BSC;RW   Toileting- Clothing Manipulation and Hygiene: Total assistance;Sit to/from stand       Functional mobility during ADLs: Minimal assistance;Rolling walker       Vision   Additional Comments: Pt unable to engage in visual assessment     Perception     Praxis      Pertinent Vitals/Pain Pain Assessment: No/denies pain     Hand Dominance Right   Extremity/Trunk Assessment Upper Extremity Assessment Upper Extremity Assessment: Generalized weakness   Lower Extremity Assessment Lower Extremity Assessment: Generalized weakness       Communication Communication Communication: Receptive difficulties;Expressive difficulties  Cognition Arousal/Alertness: Awake/alert Behavior During Therapy: Flat affect Overall Cognitive Status: Impaired/Different from baseline Area of Impairment: Orientation;Attention;Memory;Following commands;Safety/judgement;Awareness;Problem solving                 Orientation Level: Disoriented to;Time Current Attention  Level: Focused   Following Commands: Follows one step commands inconsistently;Follows one step commands with increased time Safety/Judgement: Decreased awareness of deficits;Decreased awareness of safety   Problem Solving: Slow processing;Decreased initiation;Difficulty sequencing;Requires verbal cues;Requires tactile cues General Comments: Pt very slow to initiate activity and is very easily distracted.  She is unable to complete her thoughts or sentences.  Requires max prompting to follow simple commands   General Comments  Sp02 92% with pt on 6L Hi flo 02    Exercises     Shoulder Instructions      Home Living Family/patient expects to be discharged to:: Private residence Living Arrangements: Children Available Help at Discharge: Family;Available PRN/intermittently Type of Home: Apartment Home Access: Stairs to enter Technical brewer of Steps: Flight   Home Layout: One level     Bathroom Shower/Tub: Teacher, early years/pre: Standard     Home Equipment: Environmental consultant - 4 wheels;Cane - single point   Additional Comments: Pt poor historian.  Info gleaned from chart review      Prior Functioning/Environment Level of Independence: Needs assistance        Comments: Pt reports mod indep ambulating with cane or rollator (seat broken); has difficulty getting up/down steps into apartment. Drives seldomly. Cooks some, daughter assists with cleaning (info gleaned from chart review as pt unable to provide info)        OT Problem List: Decreased strength;Decreased activity tolerance;Impaired balance (sitting and/or standing);Impaired vision/perception;Decreased coordination;Decreased safety awareness;Decreased cognition;Decreased knowledge of use of DME or AE;Cardiopulmonary status limiting activity      OT Treatment/Interventions: Self-care/ADL training;Therapeutic exercise;Neuromuscular education;DME and/or AE instruction;Therapeutic activities;Cognitive  remediation/compensation;Patient/family education;Balance training    OT Goals(Current goals can be found in the care plan section) Acute Rehab OT Goals OT Goal Formulation: Patient unable to participate in goal setting Time For Goal Achievement: 08/28/20 Potential to Achieve Goals: Good ADL Goals Pt Will Perform Eating: with modified independence;sitting;bed level Pt Will Perform Grooming: with min assist;standing Pt Will Perform Upper Body Bathing: with min assist;sitting Pt Will Transfer to Toilet: with min assist;ambulating;regular height toilet;grab bars;bedside commode Pt Will Perform Toileting - Clothing Manipulation and hygiene: with min assist;sit to/from stand Additional ADL Goal #1: Pt will initiate simple self care activity with no more than 3 cues  OT Frequency: Min 2X/week   Barriers to D/C: Decreased caregiver support          Co-evaluation              AM-PAC OT "6 Clicks" Daily Activity     Outcome Measure Help from another person eating meals?: A Lot Help from another person taking care of personal grooming?: Total Help from another person toileting, which includes using toliet, bedpan, or urinal?: Total Help from another person bathing (including washing, rinsing, drying)?: Total Help from another person to put on and taking off regular upper body clothing?: Total Help from another person to put on and taking off regular lower body clothing?: Total 6 Click Score: 7   End of Session Equipment Utilized During Treatment: Rolling walker Nurse Communication: Mobility status  Activity Tolerance: Patient limited by fatigue;Patient limited by lethargy;Other (comment) (cognition) Patient left: in bed;with call bell/phone within reach;with bed alarm set;with nursing/sitter in room  OT  Visit Diagnosis: Unsteadiness on feet (R26.81);Cognitive communication deficit (R41.841)                Time: 0092-3300 OT Time Calculation (min): 29 min Charges:  OT General  Charges $OT Visit: 1 Visit OT Evaluation $OT Eval Moderate Complexity: 1 Mod OT Treatments $Self Care/Home Management : 8-22 mins  Nilsa Nutting OTR/L Acute Rehabilitation Services Pager [phone] Office [phone]   Lucille Passy M 08/14/2020, 4:57 PM

## 2020-08-14 NOTE — Progress Notes (Signed)
OT Cancellation Note  Patient Details Name: Heidi [name] MRN: [id number] DOB: Dec 02, 1950   Cancelled Treatment:    Reason Eval/Treat Not Completed: Medical issues which prohibited therapy;Fatigue/lethargy limiting ability to participate.  Pt lethargic.  RN reports pt with nausea.  Will reattempt.  Nilsa Nutting., OTR/L Acute Rehabilitation Services Pager [phone] Office [phone]   Lucille Passy M 08/14/2020, 2:08 PM

## 2020-08-15 ENCOUNTER — Ambulatory Visit: Payer: Medicare HMO | Admitting: Internal Medicine

## 2020-08-15 DIAGNOSIS — J1282 Pneumonia due to coronavirus disease 2019: Secondary | ICD-10-CM | POA: Diagnosis not present

## 2020-08-15 DIAGNOSIS — E870 Hyperosmolality and hypernatremia: Secondary | ICD-10-CM | POA: Diagnosis not present

## 2020-08-15 DIAGNOSIS — U071 COVID-19: Secondary | ICD-10-CM | POA: Diagnosis not present

## 2020-08-15 LAB — CBC WITH DIFFERENTIAL/PLATELET
Abs Immature Granulocytes: 0.18 10*3/uL — ABNORMAL HIGH (ref 0.00–0.07)
Basophils Absolute: 0 10*3/uL (ref 0.0–0.1)
Basophils Relative: 1 %
Eosinophils Absolute: 0 10*3/uL (ref 0.0–0.5)
Eosinophils Relative: 1 %
HCT: 45.8 % (ref 36.0–46.0)
Hemoglobin: 14.3 g/dL (ref 12.0–15.0)
Immature Granulocytes: 4 %
Lymphocytes Relative: 21 %
Lymphs Abs: 0.9 10*3/uL (ref 0.7–4.0)
MCH: 27.7 pg (ref 26.0–34.0)
MCHC: 31.2 g/dL (ref 30.0–36.0)
MCV: 88.6 fL (ref 80.0–100.0)
Monocytes Absolute: 0.3 10*3/uL (ref 0.1–1.0)
Monocytes Relative: 6 %
Neutro Abs: 2.9 10*3/uL (ref 1.7–7.7)
Neutrophils Relative %: 67 %
Platelets: 273 10*3/uL (ref 150–400)
RBC: 5.17 MIL/uL — ABNORMAL HIGH (ref 3.87–5.11)
RDW: 14.4 % (ref 11.5–15.5)
WBC: 4.3 10*3/uL (ref 4.0–10.5)
nRBC: 1.2 % — ABNORMAL HIGH (ref 0.0–0.2)

## 2020-08-15 LAB — COMPREHENSIVE METABOLIC PANEL
ALT: 28 U/L (ref 0–44)
AST: 22 U/L (ref 15–41)
Albumin: 3 g/dL — ABNORMAL LOW (ref 3.5–5.0)
Alkaline Phosphatase: 52 U/L (ref 38–126)
Anion gap: 10 (ref 5–15)
BUN: 30 mg/dL — ABNORMAL HIGH (ref 8–23)
CO2: 40 mmol/L — ABNORMAL HIGH (ref 22–32)
Calcium: 9.2 mg/dL (ref 8.9–10.3)
Chloride: 97 mmol/L — ABNORMAL LOW (ref 98–111)
Creatinine, Ser: 1.03 mg/dL — ABNORMAL HIGH (ref 0.44–1.00)
GFR, Estimated: 59 mL/min — ABNORMAL LOW (ref >60)
Glucose, Bld: 59 mg/dL — ABNORMAL LOW (ref 70–99)
Potassium: 3.9 mmol/L (ref 3.5–5.1)
Sodium: 147 mmol/L — ABNORMAL HIGH (ref 135–145)
Total Bilirubin: 0.5 mg/dL (ref 0.3–1.2)
Total Protein: 6 g/dL — ABNORMAL LOW (ref 6.5–8.1)

## 2020-08-15 LAB — GLUCOSE, CAPILLARY
Glucose-Capillary: 148 mg/dL — ABNORMAL HIGH (ref 70–99)
Glucose-Capillary: 240 mg/dL — ABNORMAL HIGH (ref 70–99)
Glucose-Capillary: 239 mg/dL — ABNORMAL HIGH (ref 70–99)
Glucose-Capillary: 268 mg/dL — ABNORMAL HIGH (ref 70–99)

## 2020-08-15 LAB — CULTURE, BLOOD (ROUTINE X 2)
Culture: NO GROWTH
Culture: NO GROWTH
Special Requests: ADEQUATE
Special Requests: ADEQUATE

## 2020-08-15 LAB — MAGNESIUM: Magnesium: 2.5 mg/dL — ABNORMAL HIGH (ref 1.7–2.4)

## 2020-08-15 LAB — BRAIN NATRIURETIC PEPTIDE: B Natriuretic Peptide: 31.1 pg/mL (ref 0.0–100.0)

## 2020-08-15 LAB — D-DIMER, QUANTITATIVE: D-Dimer, Quant: 1.41 ug/mL-FEU — ABNORMAL HIGH (ref 0.00–0.50)

## 2020-08-15 LAB — C-REACTIVE PROTEIN: CRP: 0.6 mg/dL (ref <1.0)

## 2020-08-15 NOTE — Progress Notes (Signed)
Physical Therapy Treatment Patient Details Name: Heidi [name] MRN: [id number] DOB: February 14, 1951 Today's Date: 08/15/2020    History of Present Illness Pt is a 70 y.o. female who tested (+) COVID-19 on 08/01/20, now admitted 08/09/20 with AMS, found to be hypoxic. Workup for acute hypoxic respiratory failure due to COVID-19 PNA, AKI. Head CT showing possible MCA infarct; MRI negative for acute injury. PMH includes asthma, HF, HTN, CKD 3, COPD, DM, obesity.   PT Comments    Pt progressing with mobility, although reluctant to participate with any OOB activity this session despite max encouragement and education. Pt ultimately able to stand and walk short distance with RW and min guard for balance. Difficulty getting reliable pulse ox reading with mobility. Pt remains limited by generalized weakness, decreased activity tolerance and cognitive impairment, including decreased awareness and difficulty problem solving. Increased time discussing safe d/c plan as pt adamantly declining SNF, but lives with daughter who is disabled and unable to provide assist. Pt with poor awareness of mobility deficits and assist needs, asking for aide assist at home (SW notified). Heidi continue to follow acutely to address established goals.    Follow Up Recommendations  SNF;Supervision for mobility/OOB (refusing SNF, would benefit from [facility])     Equipment Recommendations   (TBD)    Recommendations for Other Services       Precautions / Restrictions Precautions Precautions: Fall;Other (comment) Precaution Comments: Watch SpO2 Restrictions Weight Bearing Restrictions: No    Mobility  Bed Mobility Overal bed mobility: Needs Assistance Bed Mobility: Rolling;Sidelying to Sit;Sit to Sidelying Rolling: Modified independent (Device/Increase time) Sidelying to sit: Modified independent (Device/Increase time)     Sit to sidelying: Modified independent (Device/Increase time) General bed mobility comments: Mod  indep with use of bed rail for repositioning/rolling and supine<>sit; increased time and effort as pt reluctant to participate and reports fatigue    Transfers Overall transfer level: Needs assistance Equipment used: Rolling walker (2 wheeled) Transfers: Sit to/from Stand Sit to Stand: Supervision         General transfer comment: Multiple sit<>stands from EOB with RW and supervision, as well as without RW and min guard for balance  Ambulation/Gait Ambulation/Gait assistance: Min guard Gait Distance (Feet): 12 Feet Assistive device: Rolling walker (2 wheeled) Gait Pattern/deviations: Step-through pattern;Decreased stride length;Trunk flexed Gait velocity: Decreased   General Gait Details: Slow, steady gait with RW and intermittent min guard for balance; pt limited by fatigue and DOE 3/4, pt declining further mobility; unable to get reliable pulse ox reading with mobility   Stairs             Wheelchair Mobility    Modified Rankin (Stroke Patients Only)       Balance Overall balance assessment: Needs assistance   Sitting balance-Leahy Scale: Good       Standing balance-Leahy Scale: Fair Standing balance comment: Can static stand without UE support to place disposable bed pad between legs to use as 'depends' before return to supine                            Cognition Arousal/Alertness: Awake/alert Behavior During Therapy: Flat affect Overall Cognitive Status: No family/caregiver present to determine baseline cognitive functioning Area of Impairment: Attention;Following commands;Safety/judgement;Awareness;Problem solving;Memory                   Current Attention Level: Sustained Memory: Decreased short-term memory Following Commands: Follows one step commands with increased time Safety/Judgement: Decreased  awareness of deficits;Decreased awareness of safety Awareness: Emergent Problem Solving: Slow processing;Decreased  initiation;Difficulty sequencing;Requires verbal cues;Requires tactile cues General Comments: Pt with improved ability to complete her thoughts/sentences with increased time, remains slow to initiate activity. Pt declining SNF since she used to work as Quarry manager at [employer], stating, "I know how those places works" - pt difficulty to reason with regarding importance of mobility, stating she cannot because she's sick. "I worked for the [employer] and I know someone in the [place] if I needed to call." Pt adamant she just got back to bed from chair (per NT, just got done using BSC but not in chair since this AM). Pt states, "I'm not crazy, it's just confusing"      Exercises      General Comments General comments (skin integrity, edema, etc.): SpO2 96% on 5L O2 HFNC at rest, unable to get reliable ready with mobility. Significantly increased time discussing d/c recommendations and safety with mobility at home - pt adamantly declining SNF, but also confirms daughter is disabled and unable to assist pt ("I'm the one who usually helps her... now my grandsons are helping but they work") - pt with poor insight into current deficits and decreased awareness regarding safe d/c plan, focused on the fact that "we should have help" but unaware of process of getting that help, stating, "My daughter was just in the hospital with covid... they should have let us be together and gotten her help... that's their fault... I know someone in the [employer] I can call to look into that if I need to"      Pertinent Vitals/Pain Pain Assessment: Faces Faces Pain Scale: Hurts a little bit Pain Location: Abdomen Pain Intervention(s): Monitored during session    Home Living                      Prior Function            PT Goals (current goals can now be found in the care plan section) Acute Rehab PT Goals Patient Stated Goal: I'm going home Progress towards PT goals: Progressing toward goals    Frequency    Min  3X/week      PT Plan Current plan remains appropriate    Co-evaluation              AM-PAC PT "6 Clicks" Mobility   Outcome Measure  Help needed turning from your back to your side while in a flat bed without using bedrails?: None Help needed moving from lying on your back to sitting on the side of a flat bed without using bedrails?: None Help needed moving to and from a bed to a chair (including a wheelchair)?: A Little Help needed standing up from a chair using your arms (e.g., wheelchair or bedside chair)?: A Little Help needed to walk in hospital room?: A Little Help needed climbing 3-5 steps with a railing? : A Lot 6 Click Score: 19    End of Session   Activity Tolerance: Patient limited by fatigue Patient left: in bed;with call bell/phone within reach;with bed alarm set Nurse Communication: Mobility status PT Visit Diagnosis: Other abnormalities of gait and mobility (R26.89);Muscle weakness (generalized) (M62.81)     Time: 0160-1093 PT Time Calculation (min) (ACUTE ONLY): 35 min  Charges:  $Therapeutic Activity: 8-22 mins $Self Care/Home Management: 8-22                     Mabeline Caras, PT,  DPT Acute Rehabilitation Services  Pager [phone] Office [place] 08/15/2020, 5:13 PM

## 2020-08-15 NOTE — Progress Notes (Addendum)
PROGRESS NOTE                                                                                                                                                                                                             Patient Demographics:    Heidi [name], is a 70 y.o. female, DOB - 09-Aug-1950, KGS:[id number]  Outpatient Primary MD for the patient is Ladell Pier, MD    LOS - 5  Admit date - 08/09/2020    Chief Complaint  Patient presents with  . Altered Mental Status       Brief Narrative (HPI from H&P)  70 year old lady prior history of asthma, diastolic heart failure, hypertension, stage IIIa CKD, insulin-dependent diabetes mellitus, morbid obesity presents with shortness of breath and chest pain. Chest x-ray shows hazy bilateral opacities in the lung bases concerning for atypical infectious process such as viral pneumonia.   Subjective:   Patient in recliner, reports generalized weakness and fatigue, reports cough still present but improving, dyspnea has improved as well.  .   Assessment  & Plan :    Acute Hypoxic Resp. Failure due to Acute Covid 19 Viral Pneumonitis + Acute on Chronic Diastolic CHF - - she unfortunately not vaccinated for Covid  - she seems to have incurred moderate to severe parenchymal lung injury.  She is with improved oxygen requirement this morning she is on 4 L today, she was on 7 L high flow nasal cannula. -She is treated with IV steroids. - She is being treated with IV remdesivir . -Received Actemra 2/5 -Hold Lasix today given sodium of 147  Encouraged the patient to sit up in chair in the daytime use I-S and flutter valve for pulmonary toiletry and then prone in bed when at night.  Will advance activity and titrate down oxygen as possible.  Actemra/Baricitinib  off label use - patient was told that if COVID-19 pneumonitis gets worse we might potentially use Actemra off label, patient  denies any known history of active diverticulitis, tuberculosis or hepatitis, understands the risks and benefits and wants to proceed with Actemra treatment if required.   SpO2: 93 % O2 Flow Rate (L/min): 4 L/min FiO2 (%): 100 %  Recent Labs  Lab 08/09/20 2315 08/09/20 2328 08/11/20 0500 08/12/20 0410 08/12/20 0839 08/13/20 0108 08/14/20 0129 08/15/20 0146  WBC  --   --  6.2 6.0  --  5.9 5.8 4.3  HGB  --    < > 12.1 12.7  --  12.8 12.9 14.3  HCT  --    < > 39.0 42.4  --  43.0 42.8 45.8  PLT  --   --  215 238  --  252 263 273  CRP 12.1*  --  6.9* 2.6* 2.1* 1.1* 0.7 0.6  BNP  --   --   --   --  28.9 419.3* 130.4* 31.1  DDIMER 1.33*  --  1.52* 1.00* 1.03* 0.92* 1.20* 1.41*  PROCALCITON <0.10  --   --   --  <0.10 <0.10 <0.10  --   AST 42*  --  39  --   --  30 33 22  ALT 28  --  29  --   --  27 24 28   ALKPHOS 65  --  55  --   --  48 44 52  BILITOT 0.4  --  0.4  --   --  0.6 1.2 0.5  ALBUMIN 3.4*  --  2.8*  --   --  2.7* 2.8* 3.0*  LATICACIDVEN 2.1*  --   --   --   --   --   --   --    < > = values in this interval not displayed.    2.  Acute on chronic diastolic CHF EF 63% on recent echocardiogram. Continue beta-blocker,  -She is on IV Lasix as needed, hold on giving Lasix today given her sodium is 147.  3.  Breast nodule on CT angiogram of May 2021 -Patient had mammogram done 05/01/2020, with evidence of multiple benign bilateral breast nodules, no evidence of malignancy, with recommendation to repeat mammogram in 1 year, have discussed this with the patient, she is aware of these findings and the need to repeat mammogram.  4.  CT head showing possible MCA nonspecific finding.  MRI brain/MRA head with no acute findings.  5.  Thyroid nodule.  TSH normal, has had biopsy done in the past and deemed to be benign.  Follow with PCP.  6.  Dyslipidemia.  On Lipitor.  7.  AKI on CKD 2. AKI much improved, baseline creatinine around 1.2.  8.   Morbid obesity with underlying OSA.  BMI  of 40, follow with PCP for weight loss, counseled on compliance with nighttime CPAP in the hospital, per pulmonary who had seen the patient initially will benefit from nighttime CPAP.  Outpatient sleep study pulmonary follow-up.  9. DM type II.  On Lantus and sliding scale monitor and adjust.  Labile, with CBG of 62 yesterday, so I will hold on increasing her insulin  Lab Results  Component Value Date   HGBA1C 9.9 (A) 05/14/2020   CBG (last 3)  Recent Labs    08/14/20 2003 08/15/20 0713 08/15/20 1153  GLUCAP 85 148* 240*   10.  Hypernatremia -Mild, will hold Lasix.      Condition - Extremely Guarded  Family Communication  : I have discussed at length with the patient today, and answered all her questions.  Code Status :  Full  Consults  :  PCCM   Procedures  :     CTA - 1. Pulmonary arterial opacification is moderate. No large pulmonary emboli. Small peripheral emboli might be inapparent, but none are seen. 2. Cardiomegaly. Some coronary artery calcification. 3. Widespread patchy pulmonary infiltrates consistent with the clinical history of viral pneumonia. Fairly dense consolidation in  the left lower lobe could possibly be superimposed bacterial pneumonia.  CT Head - no acute changes, questionable MCA nonspecific finding.  MRA -       PUD Prophylaxis :  PPI  Disposition Plan  :    Status is: Inpatient  Remains inpatient appropriate because:IV treatments appropriate due to intensity of illness or inability to take PO   Dispo: The patient is from: Home              Anticipated d/c is to: SNF              Anticipated d/c date is: 3 days              Patient currently is not medically stable to d/c.   Difficult to place patient No  DVT Prophylaxis  :  Lovenox    Lab Results  Component Value Date   PLT 273 08/15/2020    Diet :  Diet Order            Diet Carb Modified Fluid consistency: Thin; Room service appropriate? Yes  Diet effective now                   Inpatient Medications  Scheduled Meds: . aerochamber plus with mask  1 each Other Once  . aspirin  81 mg Oral Daily  . atenolol  25 mg Oral Daily  . atorvastatin  40 mg Oral QPM  . chlorhexidine  15 mL Mouth Rinse BID  . enoxaparin (LOVENOX) injection  50 mg Subcutaneous Q24H  . insulin aspart  0-15 Units Subcutaneous TID WC  . insulin aspart  0-5 Units Subcutaneous QHS  . insulin aspart protamine- aspart  50 Units Subcutaneous BID WC  . mouth rinse  15 mL Mouth Rinse q12n4p  . methylPREDNISolone (SOLU-MEDROL) injection  40 mg Intravenous Q12H  . mometasone-formoterol  2 puff Inhalation BID  . olopatadine  1 drop Both Eyes BID  . pantoprazole  40 mg Oral QHS  . topiramate  75 mg Oral BID   Continuous Infusions:  PRN Meds:.acetaminophen, albuterol, guaiFENesin-dextromethorphan, loperamide, ondansetron (ZOFRAN) IV  Antibiotics  :    Anti-infectives (From admission, onward)   Start     Dose/Rate Route Frequency Ordered Stop   08/11/20 1000  remdesivir 100 mg in sodium chloride 0.9 % 100 mL IVPB       "Followed by" Linked Group Details   100 mg 200 mL/hr over 30 Minutes Intravenous Daily 08/10/20 1614 08/14/20 1007   08/10/20 1700  remdesivir 200 mg in sodium chloride 0.9% 250 mL IVPB       "Followed by" Linked Group Details   200 mg 580 mL/hr over 30 Minutes Intravenous Once 08/10/20 1614 08/10/20 2051   08/10/20 1600  cefTRIAXone (ROCEPHIN) 2 g in sodium chloride 0.9 % 100 mL IVPB  Status:  Discontinued        2 g 200 mL/hr over 30 Minutes Intravenous Every 24 hours 08/10/20 0513 08/10/20 0839   08/10/20 0600  azithromycin (ZITHROMAX) 250 mg in dextrose 5 % 125 mL IVPB  Status:  Discontinued        250 mg 125 mL/hr over 60 Minutes Intravenous Every 24 hours 08/10/20 0514 08/10/20 0839   08/10/20 0515  doxycycline (VIBRAMYCIN) 100 mg in sodium chloride 0.9 % 250 mL IVPB  Status:  Discontinued        100 mg 125 mL/hr over 120 Minutes Intravenous Every 12 hours  08/10/20 0513 08/10/20 0514  08/10/20 0215  cefTRIAXone (ROCEPHIN) 1 g in sodium chloride 0.9 % 100 mL IVPB        1 g 200 mL/hr over 30 Minutes Intravenous  Once 08/10/20 0202 08/10/20 0405        Rebeca Valdivia M.D on 08/15/2020 at 2:03 PM  To page go to [email or web address]   [facility] -  Office  [phone]    See all Orders from today for further details    Objective:   Vitals:   08/15/20 0400 08/15/20 0715 08/15/20 0810 08/15/20 1154  BP: (!) 110/59 99/60 (!) 117/57 (!) 104/58  Pulse: 71 75  73  Resp: 14 20  20   Temp: 98.2 F (36.8 C) 98.7 F (37.1 C)  98.7 F (37.1 C)  TempSrc: Oral Axillary  Oral  SpO2: 93% 91% 92% 93%  Weight:      Height:        Wt Readings from Last 3 Encounters:  08/09/20 107.5 kg  08/01/20 107.5 kg  06/13/20 107.9 kg     Intake/Output Summary (Last 24 hours) at 08/15/2020 1403 Last data filed at 08/15/2020 0855 Gross per 24 hour  Intake 250 ml  Output --  Net 250 ml     Physical Exam  Awake Alert, Oriented X 3, No new F.N deficits, Normal affect Symmetrical Chest wall movement, Good air movement bilaterally, CTAB RRR,No Gallops,Rubs or new Murmurs, No Parasternal Heave +ve B.Sounds, Abd Soft, No tenderness, No rebound - guarding or rigidity. No Cyanosis, Clubbing or edema, No new Rash or bruise       Data Review:    CBC Recent Labs  Lab 08/11/20 0500 08/12/20 0410 08/13/20 0108 08/14/20 0129 08/15/20 0146  WBC 6.2 6.0 5.9 5.8 4.3  HGB 12.1 12.7 12.8 12.9 14.3  HCT 39.0 42.4 43.0 42.8 45.8  PLT 215 238 252 263 273  MCV 90.3 90.0 90.1 89.2 88.6  MCH 28.0 27.0 26.8 26.9 27.7  MCHC 31.0 30.0 29.8* 30.1 31.2  RDW 14.7 14.6 14.4 14.3 14.4  LYMPHSABS 1.0  --  1.0 1.0 0.9  MONOABS 0.3  --  0.3 0.2 0.3  EOSABS 0.0  --  0.0 0.0 0.0  BASOSABS 0.0  --  0.0 0.0 0.0    Recent Labs  Lab 08/09/20 2315 08/09/20 2328 08/10/20 0500 08/10/20 0618 08/11/20 0500 08/12/20 0410 08/12/20 0839 08/13/20 0108  08/14/20 0129 08/15/20 0146  NA 136   < >  --    < > 144 144  --  145 144 147*  K 3.8   < >  --    < > 3.9 3.7  --  3.9 4.5 3.9  CL 94*  --   --   --  99 100  --  100 100 97*  CO2 29  --   --   --  33* 33*  --  34* 32 40*  GLUCOSE 379*  --   --   --  114* 183*  --  104* 191* 59*  BUN 28*  --   --   --  30* 29*  --  33* 32* 30*  CREATININE 1.43*  --   --   --  1.25* 1.05*  --  1.15* 1.09* 1.03*  CALCIUM 9.1  --   --   --  9.0 9.1  --  9.3 8.8* 9.2  AST 42*  --   --   --  39  --   --  30 33 22  ALT 28  --   --   --  29  --   --  27 24 28   ALKPHOS 65  --   --   --  55  --   --  48 44 52  BILITOT 0.4  --   --   --  0.4  --   --  0.6 1.2 0.5  ALBUMIN 3.4*  --   --   --  2.8*  --   --  2.7* 2.8* 3.0*  MG  --   --  2.6*  --   --   --  2.3 2.3 2.2 2.5*  CRP 12.1*  --   --   --  6.9* 2.6* 2.1* 1.1* 0.7 0.6  DDIMER 1.33*  --   --   --  1.52* 1.00* 1.03* 0.92* 1.20* 1.41*  PROCALCITON <0.10  --   --   --   --   --  <0.10 <0.10 <0.10  --   LATICACIDVEN 2.1*  --   --   --   --   --   --   --   --   --   BNP  --   --   --   --   --   --  28.9 419.3* 130.4* 31.1   < > = values in this interval not displayed.    ------------------------------------------------------------------------------------------------------------------ No results for input(s): CHOL, HDL, LDLCALC, TRIG, CHOLHDL, LDLDIRECT in the last 72 hours.  Lab Results  Component Value Date   HGBA1C 9.9 (A) 05/14/2020   ------------------------------------------------------------------------------------------------------------------ No results for input(s): TSH, T4TOTAL, T3FREE, THYROIDAB in the last 72 hours.  Invalid input(s): FREET3  Cardiac Enzymes No results for input(s): CKMB, TROPONINI, MYOGLOBIN in the last 168 hours.  Invalid input(s): CK ------------------------------------------------------------------------------------------------------------------    Component Value Date/Time   BNP 31.1 08/15/2020 0146    Micro  Results Recent Results (from the past 240 hour(s))  Blood Culture (routine x 2)     Status: None   Collection Time: 08/09/20 11:18 PM   Specimen: BLOOD RIGHT FOREARM  Result Value Ref Range Status   Specimen Description BLOOD RIGHT FOREARM  Final   Special Requests   Final    BOTTLES DRAWN AEROBIC AND ANAEROBIC Blood Culture adequate volume   Culture   Final    NO GROWTH 5 DAYS Performed at [facility], [address], [place], [place] [address]    Report Status 08/15/2020 FINAL  Final  Blood Culture (routine x 2)     Status: None   Collection Time: 08/09/20 11:50 PM   Specimen: BLOOD LEFT FOREARM  Result Value Ref Range Status   Specimen Description BLOOD LEFT FOREARM  Final   Special Requests   Final    BOTTLES DRAWN AEROBIC AND ANAEROBIC Blood Culture adequate volume   Culture   Final    NO GROWTH 5 DAYS Performed at [facility], [place] [address] [place], [place] [address]    Report Status 08/15/2020 FINAL  Final  MRSA PCR Screening     Status: None   Collection Time: 08/11/20 11:56 PM   Specimen: Nasal Mucosa; Nasopharyngeal  Result Value Ref Range Status   MRSA by PCR NEGATIVE NEGATIVE Final    Comment:        The GeneXpert MRSA Assay (FDA approved for NASAL specimens only), is one component of a comprehensive MRSA colonization surveillance program. It is not intended to diagnose MRSA infection nor to guide or monitor treatment for MRSA infections. Performed at [facility], [address] [place], [place] [address]     Radiology Reports DG Chest 2 View  Addendum Date: 08/01/2020   ADDENDUM REPORT: 08/01/2020 15:09 ADDENDUM: Additional history: COPD, asthma Electronically Signed   By: Lavonia Dana M.D.   On: 08/01/2020 15:09   Result Date: 08/01/2020 CLINICAL DATA:  Shortness of breath, sore throat, fever and headache for 3 days, of both grandchildren tested positive for COVID-19 yesterday EXAM: CHEST - 2 VIEW COMPARISON:  02/28/2020 FINDINGS:  Enlargement of cardiac silhouette. Mediastinal contours and pulmonary vascularity normal. Minimal linear atelectasis at lingula and RIGHT base. Central peribronchial thickening without definite pulmonary infiltrate, pleural effusion, or pneumothorax. Bones demineralized. IMPRESSION: Enlargement of cardiac silhouette with minimal bibasilar atelectasis. Electronically Signed: By: Lavonia Dana M.D. On: 08/01/2020 15:06   CT Head Wo Contrast  Result Date: 08/10/2020 CLINICAL DATA:  Mental status changes.  Coronavirus infection. EXAM: CT HEAD WITHOUT CONTRAST TECHNIQUE: Contiguous axial images were obtained from the base of the skull through the vertex without intravenous contrast. COMPARISON:  01/27/2018 FINDINGS: Brain: No evidence of accelerated atrophy. No evidence of acute infarction, intra-axial mass lesion, hemorrhage, hydrocephalus or extra-axial collection 1 cm calcified meningioma at the right frontoparietal convexity, not significant. Vascular: Question a hyperdense MCA branch on the left in the sylvian fissure region. This is not a definite finding. Skull: Normal Sinuses/Orbits: Clear Other: None IMPRESSION: 1. No acute brain parenchymal finding by CT. Question a hyperdense MCA branch on the left in the Sylvian fissure region. This is not a definite finding. If there is clinical concern about a left MCA territory acute infarction, consider CT angiography. 2. 1 cm calcified meningioma at the right frontoparietal convexity, not significant. Electronically Signed   By: Nelson Chimes M.D.   On: 08/10/2020 02:52   CT Angio Chest PE W and/or Wo Contrast  Result Date: 08/10/2020 CLINICAL DATA:  Coronavirus pneumonia.  Question pulmonary emboli. EXAM: CT ANGIOGRAPHY CHEST WITH CONTRAST TECHNIQUE: Multidetector CT imaging of the chest was performed using the standard protocol during bolus administration of intravenous contrast. Multiplanar CT image reconstructions and MIPs were obtained to evaluate the vascular  anatomy. CONTRAST:  33mL OMNIPAQUE IOHEXOL 350 MG/ML SOLN COMPARISON:  Chest radiography yesterday. FINDINGS: Cardiovascular: Cardiomegaly. No pericardial fluid. Some coronary artery calcification. No visible thoracic aortic atherosclerotic calcification. Pulmonary arterial opacification is moderate. There are no large pulmonary emboli. Small peripheral emboli might be inapparent, but none are seen. Mediastinum/Nodes: No mass or lymphadenopathy. Lungs/Pleura: No pleural effusion. Widespread patchy pulmonary infiltrates consistent with the clinical history of viral pneumonia. Fairly dense consolidation in the left lower lobe. There could possibly be superimposed bacterial pneumonia. Upper Abdomen: Left renal cyst.  Not completely evaluated. Musculoskeletal: Negative Review of the MIP images confirms the above findings. IMPRESSION: 1. Pulmonary arterial opacification is moderate. No large pulmonary emboli. Small peripheral emboli might be inapparent, but none are seen. 2. Cardiomegaly. Some coronary artery calcification. 3. Widespread patchy pulmonary infiltrates consistent with the clinical history of viral pneumonia. Fairly dense consolidation in the left lower lobe could possibly be superimposed bacterial pneumonia. Electronically Signed   By: Nelson Chimes M.D.   On: 08/10/2020 01:48   MR ANGIO HEAD WO CONTRAST  Result Date: 08/14/2020 CLINICAL DATA:  Transient ischemic attack.  Encephalopathy. EXAM: MRA HEAD WITHOUT CONTRAST TECHNIQUE: Angiographic images of the Circle of Willis were obtained using MRA technique without intravenous contrast. COMPARISON:  None. FINDINGS: POSTERIOR CIRCULATION: --Vertebral arteries: Normal --Inferior cerebellar arteries: Normal. --Basilar artery: Normal. --Superior cerebellar arteries: Normal. --Posterior  cerebral arteries: Normal. ANTERIOR CIRCULATION: --Intracranial internal carotid arteries: Normal. --Anterior cerebral arteries (ACA): Normal. --Middle cerebral arteries (MCA):  Normal. ANATOMIC VARIANTS: Both posterior communicating arteries are patent IMPRESSION: Normal intracranial MRA. Electronically Signed   By: Ulyses Jarred M.D.   On: 08/14/2020 01:39   MR BRAIN WO CONTRAST  Result Date: 08/10/2020 CLINICAL DATA:  Mental status change with unknown cause. EXAM: MRI HEAD WITHOUT CONTRAST TECHNIQUE: Multiplanar, multiecho pulse sequences of the brain and surrounding structures were obtained without intravenous contrast. COMPARISON:  01/27/2018 FINDINGS: Brain: No acute infarction, hemorrhage, hydrocephalus, extra-axial collection or mass lesion. Mild for age chronic small vessel ischemic type change in the periventricular white matter and pons. Age normal brain volume Vascular: Normal flow voids Skull and upper cervical spine: Normal marrow signal Sinuses/Orbits: Bilateral cataract resection.  No acute finding IMPRESSION: No acute finding or cause for symptoms. Electronically Signed   By: Monte Fantasia M.D.   On: 08/10/2020 10:10   DG Chest Port 1 View  Result Date: 08/13/2020 CLINICAL DATA:  Shortness of breath, cough, COVID EXAM: PORTABLE CHEST 1 VIEW COMPARISON:  08/12/2020 FINDINGS: Bilateral pulmonary opacities remain present with similar lung aeration. No significant pleural effusion. No pneumothorax. Stable cardiomediastinal contours. IMPRESSION: Similar appearance of bilateral pulmonary opacities. Electronically Signed   By: Macy Mis M.D.   On: 08/13/2020 10:27   DG Chest Port 1 View  Result Date: 08/12/2020 CLINICAL DATA:  Hypoxia.  COVID. EXAM: PORTABLE CHEST 1 VIEW COMPARISON:  08/09/2020 FINDINGS: 0520 hours. The cardio pericardial silhouette is enlarged. Low volume film with patchy bilateral airspace disease superimposed on underlying diffuse interstitial coarsening. No substantial pleural effusion. No pneumothorax. Telemetry leads overlie the chest. IMPRESSION: No substantial interval change. Electronically Signed   By: Misty [name] M.D.   On: 08/12/2020  05:27   DG Chest Port 1 View  Result Date: 08/09/2020 CLINICAL DATA:  70 year old female with shortness of breath and cough. Positive COVID-19 EXAM: PORTABLE CHEST 1 VIEW COMPARISON:  Chest radiograph dated 08/01/2020 FINDINGS: Bilateral confluent and streaky airspace opacities consistent with multilobar pneumonia and in keeping with COVID-19. No pleural effusion pneumothorax. Stable cardiomegaly. No acute osseous pathology. IMPRESSION: Multilobar pneumonia. Electronically Signed   By: Anner Crete M.D.   On: 08/09/2020 22:57   DG Abd Portable 1V  Result Date: 08/13/2020 CLINICAL DATA:  Nausea. EXAM: PORTABLE ABDOMEN - 1 VIEW COMPARISON:  None. FINDINGS: No gaseous small moderate gaseous distention of the stomach noted bowel dilatation. Air is seen scattered along the nondilated transverse and left colon. Multiple phleboliths are seen in the right paramidline abdomen and along the pelvic sidewalls bilaterally. Visualized bony anatomy unremarkable. IMPRESSION: No findings to suggest bowel obstruction. Electronically Signed   By: Misty [name] M.D.   On: 08/13/2020 10:36

## 2020-08-16 DIAGNOSIS — G9349 Other encephalopathy: Secondary | ICD-10-CM | POA: Diagnosis not present

## 2020-08-16 DIAGNOSIS — U071 COVID-19: Secondary | ICD-10-CM | POA: Diagnosis not present

## 2020-08-16 DIAGNOSIS — N189 Chronic kidney disease, unspecified: Secondary | ICD-10-CM | POA: Diagnosis not present

## 2020-08-16 DIAGNOSIS — N179 Acute kidney failure, unspecified: Secondary | ICD-10-CM | POA: Diagnosis not present

## 2020-08-16 LAB — COMPREHENSIVE METABOLIC PANEL
ALT: 29 U/L (ref 0–44)
AST: 23 U/L (ref 15–41)
Albumin: 3.1 g/dL — ABNORMAL LOW (ref 3.5–5.0)
Alkaline Phosphatase: 52 U/L (ref 38–126)
Anion gap: 9 (ref 5–15)
BUN: 28 mg/dL — ABNORMAL HIGH (ref 8–23)
CO2: 34 mmol/L — ABNORMAL HIGH (ref 22–32)
Calcium: 9.3 mg/dL (ref 8.9–10.3)
Chloride: 100 mmol/L (ref 98–111)
Creatinine, Ser: 1.16 mg/dL — ABNORMAL HIGH (ref 0.44–1.00)
GFR, Estimated: 51 mL/min — ABNORMAL LOW (ref >60)
Glucose, Bld: 148 mg/dL — ABNORMAL HIGH (ref 70–99)
Potassium: 3.7 mmol/L (ref 3.5–5.1)
Sodium: 143 mmol/L (ref 135–145)
Total Bilirubin: 0.9 mg/dL (ref 0.3–1.2)
Total Protein: 6.1 g/dL — ABNORMAL LOW (ref 6.5–8.1)

## 2020-08-16 LAB — CBC WITH DIFFERENTIAL/PLATELET
Abs Immature Granulocytes: 0.22 10*3/uL — ABNORMAL HIGH (ref 0.00–0.07)
Basophils Absolute: 0 10*3/uL (ref 0.0–0.1)
Basophils Relative: 0 %
Eosinophils Absolute: 0 10*3/uL (ref 0.0–0.5)
Eosinophils Relative: 1 %
HCT: 43.5 % (ref 36.0–46.0)
Hemoglobin: 14 g/dL (ref 12.0–15.0)
Immature Granulocytes: 5 %
Lymphocytes Relative: 20 %
Lymphs Abs: 0.9 10*3/uL (ref 0.7–4.0)
MCH: 27.7 pg (ref 26.0–34.0)
MCHC: 32.2 g/dL (ref 30.0–36.0)
MCV: 86 fL (ref 80.0–100.0)
Monocytes Absolute: 0.4 10*3/uL (ref 0.1–1.0)
Monocytes Relative: 9 %
Neutro Abs: 3.1 10*3/uL (ref 1.7–7.7)
Neutrophils Relative %: 65 %
Platelets: 300 10*3/uL (ref 150–400)
RBC: 5.06 MIL/uL (ref 3.87–5.11)
RDW: 14.1 % (ref 11.5–15.5)
WBC: 4.7 10*3/uL (ref 4.0–10.5)
nRBC: 1.3 % — ABNORMAL HIGH (ref 0.0–0.2)

## 2020-08-16 LAB — MAGNESIUM: Magnesium: 2.2 mg/dL (ref 1.7–2.4)

## 2020-08-16 LAB — GLUCOSE, CAPILLARY
Glucose-Capillary: 192 mg/dL — ABNORMAL HIGH (ref 70–99)
Glucose-Capillary: 287 mg/dL — ABNORMAL HIGH (ref 70–99)
Glucose-Capillary: 157 mg/dL — ABNORMAL HIGH (ref 70–99)
Glucose-Capillary: 229 mg/dL — ABNORMAL HIGH (ref 70–99)

## 2020-08-16 LAB — BRAIN NATRIURETIC PEPTIDE: B Natriuretic Peptide: 23.6 pg/mL (ref 0.0–100.0)

## 2020-08-16 LAB — C-REACTIVE PROTEIN: CRP: <0.5 mg/dL (ref <1.0)

## 2020-08-16 LAB — D-DIMER, QUANTITATIVE: D-Dimer, Quant: 1.16 ug/mL-FEU — ABNORMAL HIGH (ref 0.00–0.50)

## 2020-08-16 NOTE — Progress Notes (Signed)
Physical Therapy Treatment Patient Details Name: Heidi [name] MRN: [id number] DOB: August 26, 1950 Today's Date: 08/16/2020    History of Present Illness Pt is a 70 y.o. female who tested (+) COVID-19 on 08/01/20, now admitted 08/09/20 with AMS, found to be hypoxic. Workup for acute hypoxic respiratory failure due to COVID-19 PNA, AKI. Head CT showing possible MCA infarct; MRI negative for acute injury. PMH includes asthma, HF, HTN, CKD 3, COPD, DM, obesity.   PT Comments    Pt finally able to progress to hallway ambulation with max encouragement from PT/RN. Pt demonstrates improving strength and activity tolerance, performing ADLs and ambulating with RW; supervision for safety. Pt continues to require intermittent seated rest breaks secondary to fatigue. SpO2 >/96% on 2L O2 [place] (which pt wears at baseline). Will continue to follow acutely to address established goals.    Follow Up Recommendations  Home health PT;Supervision - Intermittent (pt declining SNF; would benefit from [facility] aide)     Equipment Recommendations  None recommended by PT    Recommendations for Other Services       Precautions / Restrictions Precautions Precautions: Fall Precaution Comments: 2L O2 [place] baseline Restrictions Weight Bearing Restrictions: No    Mobility  Bed Mobility Overal bed mobility: Modified Independent Bed Mobility: Rolling;Supine to Sit           General bed mobility comments: Mod indep with use of bed rail for repositioning/rolling and supine<>sit    Transfers Overall transfer level: Needs assistance Equipment used: Rolling walker (2 wheeled) Transfers: Sit to/from Stand Sit to Stand: Supervision         General transfer comment: Able to stand from EOB, low toilet height and chair without armrests to RW, reliant on momentum and UE support; supervision for safety  Ambulation/Gait Ambulation/Gait assistance: Supervision Gait Distance (Feet): 100 Feet Assistive device: Rolling  walker (2 wheeled) Gait Pattern/deviations: Step-through pattern;Decreased stride length;Trunk flexed Gait velocity: Decreased   General Gait Details: Slow, steady gait with RW and supervision for safety; seated rest to void in bathroom and seated rest to wash hands at sink; pt limited by fatigue, minimal DOE noted; SpO2 >/96% on RA; pt adamantly declining further distance   Stairs             Wheelchair Mobility    Modified Rankin (Stroke Patients Only)       Balance Overall balance assessment: Needs assistance   Sitting balance-Leahy Scale: Good Sitting balance - Comments: Able to wash hands sitting at sink without back support     Standing balance-Leahy Scale: Fair Standing balance comment: Can static stand without UE support to perform pericare                            Cognition Arousal/Alertness: Awake/alert Behavior During Therapy: Flat affect Overall Cognitive Status: No family/caregiver present to determine baseline cognitive functioning Area of Impairment: Attention;Following commands;Safety/judgement;Awareness;Problem solving;Memory                   Current Attention Level: Sustained;Selective Memory: Decreased short-term memory Following Commands: Follows one step commands with increased time Safety/Judgement: Decreased awareness of deficits;Decreased awareness of safety Awareness: Emergent Problem Solving: Slow processing;Decreased initiation;Difficulty sequencing;Requires verbal cues;Requires tactile cues        Exercises      General Comments        Pertinent Vitals/Pain Pain Assessment: Faces Faces Pain Scale: Hurts a little bit Pain Location: Substernal Pain Descriptors / Indicators: Discomfort  Pain Intervention(s): Monitored during session    Home Living                      Prior Function            PT Goals (current goals can now be found in the care plan section) Progress towards PT goals:  Progressing toward goals    Frequency    Min 3X/week      PT Plan Current plan remains appropriate    Co-evaluation              AM-PAC PT "6 Clicks" Mobility   Outcome Measure  Help needed turning from your back to your side while in a flat bed without using bedrails?: None Help needed moving from lying on your back to sitting on the side of a flat bed without using bedrails?: None Help needed moving to and from a bed to a chair (including a wheelchair)?: A Little Help needed standing up from a chair using your arms (e.g., wheelchair or bedside chair)?: A Little Help needed to walk in hospital room?: A Little Help needed climbing 3-5 steps with a railing? : A Lot 6 Click Score: 19    End of Session   Activity Tolerance: Patient tolerated treatment well Patient left: in chair;with call bell/phone within reach;with nursing/sitter in room Nurse Communication: Mobility status PT Visit Diagnosis: Other abnormalities of gait and mobility (R26.89);Muscle weakness (generalized) (M62.81)     Time: 1245-1310 PT Time Calculation (min) (ACUTE ONLY): 25 min  Charges:  $Therapeutic Exercise: 23-37 mins                     Mabeline Caras, PT, DPT Acute Rehabilitation Services  Pager [phone] Office [place] 08/16/2020, 2:14 PM

## 2020-08-16 NOTE — Progress Notes (Signed)
SATURATION QUALIFICATIONS: (This note is used to comply with regulatory documentation for home oxygen)  Patient Saturations on Room Air at Rest = N/A - pt wears 2L O2 [place] at baseline  Patient Saturations on Room Air while Ambulating = N/A  Patient Saturations on 2 Liters of oxygen while Ambulating = 96%  Mabeline Caras, PT, DPT Acute Rehabilitation Services  Pager [phone] Office [phone]

## 2020-08-16 NOTE — Plan of Care (Signed)
  Problem: Coping: Goal: Psychosocial and spiritual needs will be supported Outcome: Progressing   Problem: Respiratory: Goal: Will maintain a patent airway Outcome: Progressing Goal: Complications related to the disease process, condition or treatment will be avoided or minimized Outcome: Progressing   Problem: Education: Goal: Knowledge of General Education information will improve Description: Including pain rating scale, medication(s)/side effects and non-pharmacologic comfort measures Outcome: Progressing   Problem: Health Behavior/Discharge Planning: Goal: Ability to manage health-related needs will improve Outcome: Progressing   Problem: Clinical Measurements: Goal: Ability to maintain clinical measurements within normal limits will improve Outcome: Progressing Goal: Will remain free from infection Outcome: Progressing Goal: Diagnostic test results will improve Outcome: Progressing Goal: Respiratory complications will improve Outcome: Progressing Goal: Cardiovascular complication will be avoided Outcome: Progressing   Problem: Activity: Goal: Risk for activity intolerance will decrease Outcome: Progressing   Problem: Nutrition: Goal: Adequate nutrition will be maintained Outcome: Progressing   Problem: Coping: Goal: Level of anxiety will decrease Outcome: Progressing   Problem: Elimination: Goal: Will not experience complications related to bowel motility Outcome: Progressing Goal: Will not experience complications related to urinary retention Outcome: Progressing   Problem: Pain Managment: Goal: General experience of comfort will improve Outcome: Progressing   Problem: Safety: Goal: Ability to remain free from injury will improve Outcome: Progressing   Problem: Skin Integrity: Goal: Risk for impaired skin integrity will decrease Outcome: Progressing

## 2020-08-16 NOTE — Progress Notes (Signed)
PROGRESS NOTE                                                                                                                                                                                                             Patient Demographics:    Mykala Mccready, is a 70 y.o. female, DOB - 1950/11/04, TIW:[id number]  Outpatient Primary MD for the patient is Ladell Pier, MD    LOS - 6  Admit date - 08/09/2020    Chief Complaint  Patient presents with  . Altered Mental Status       Brief Narrative (HPI from H&P)  70 year old lady prior history of asthma, diastolic heart failure, hypertension, stage IIIa CKD, insulin-dependent diabetes mellitus, morbid obesity presents with shortness of breath and chest pain. Chest x-ray shows hazy bilateral opacities in the lung bases concerning for atypical infectious process such as viral pneumonia.   Subjective:   Patient in recliner, she reports generalized weakness and fatigue, dyspnea has significantly improved, mainly with activity.[id number]   Assessment  & Plan :    Acute Hypoxic Resp. Failure due to Acute Covid 19 Viral Pneumonitis + Acute on Chronic Diastolic CHF - - she unfortunately not vaccinated for Covid  - she seems to have incurred moderate to severe parenchymal lung injury.  She has improved, she is currently on 2 L nasal cannula which is her baseline.  -She is treated with IV steroids. - She is being treated with IV remdesivir . -Received Actemra 2/5 -Euvolemic today, no need for Lasix  Encouraged the patient to sit up in chair in the daytime use I-S and flutter valve for pulmonary toiletry and then prone in bed when at night.  Will advance activity and titrate down oxygen as possible.  Actemra/Baricitinib  off label use - patient was told that if COVID-19 pneumonitis gets worse we might potentially use Actemra off label, patient denies any known history of active  diverticulitis, tuberculosis or hepatitis, understands the risks and benefits and wants to proceed with Actemra treatment if required.   SpO2: 100 % O2 Flow Rate (L/min): 2 L/min FiO2 (%): 100 %  Recent Labs  Lab 08/09/20 2315 08/09/20 2328 08/11/20 0500 08/12/20 0410 08/12/20 0839 08/13/20 0108 08/14/20 0129 08/15/20 0146 08/16/20 0133 08/16/20 0201  WBC  --    < > 6.2 6.0  --  5.9 5.8 4.3  --  4.7  HGB  --    < > 12.1 12.7  --  12.8 12.9 14.3  --  14.0  HCT  --    < > 39.0 42.4  --  43.0 42.8 45.8  --  43.5  PLT  --    < > 215 238  --  252 263 273  --  300  CRP 12.1*  --  6.9* 2.6* 2.1* 1.1* 0.7 0.6  --  <0.5  BNP  --   --   --   --  28.9 419.3* 130.4* 31.1 23.6  --   DDIMER 1.33*  --  1.52* 1.00* 1.03* 0.92* 1.20* 1.41*  --  1.16*  PROCALCITON <0.10  --   --   --  <0.10 <0.10 <0.10  --   --   --   AST 42*  --  39  --   --  30 33 22  --  23  ALT 28  --  29  --   --  27 24 28   --  29  ALKPHOS 65  --  55  --   --  48 44 52  --  52  BILITOT 0.4  --  0.4  --   --  0.6 1.2 0.5  --  0.9  ALBUMIN 3.4*  --  2.8*  --   --  2.7* 2.8* 3.0*  --  3.1*  LATICACIDVEN 2.1*  --   --   --   --   --   --   --   --   --    < > = values in this interval not displayed.    2.  Acute on chronic diastolic CHF EF 60% on recent echocardiogram. Continue beta-blocker,  -She is on IV Lasix as needed,  3.  Breast nodule on CT angiogram of May 2021 -Patient had mammogram done 05/01/2020, with evidence of multiple benign bilateral breast nodules, no evidence of malignancy, with recommendation to repeat mammogram in 1 year, have discussed this with the patient, she is aware of these findings and the need to repeat mammogram.  4.  CT head showing possible MCA nonspecific finding.  MRI brain/MRA head with no acute findings.  5.  Thyroid nodule.  TSH normal, has had biopsy done in the past and deemed to be benign.  Follow with PCP.  6.  Dyslipidemia.  On Lipitor.  7.  AKI on CKD 2. AKI much improved,  baseline creatinine around 1.2.  8.   Morbid obesity with underlying OSA.  BMI of 40, follow with PCP for weight loss, counseled on compliance with nighttime CPAP in the hospital, per pulmonary who had seen the patient initially will benefit from nighttime CPAP.  Outpatient sleep study pulmonary follow-up.  9. DM type II.  On Lantus and sliding scale monitor and adjust.  Labile, with CBG of 62 yesterday, so I will hold on increasing her insulin  Lab Results  Component Value Date   HGBA1C 9.9 (A) 05/14/2020   CBG (last 3)  Recent Labs    08/15/20 2118 08/16/20 0727 08/16/20 1150  GLUCAP 268* 192* 287*   10.  Hypernatremia -Resolved, sodium is 143 today with holding Lasix.      Condition - Extremely Guarded  Family Communication  : Discussed with son by phone 2/11  Code Status :  Full  Consults  :  PCCM   Procedures  :     CTA - 1.  Pulmonary arterial opacification is moderate. No large pulmonary emboli. Small peripheral emboli might be inapparent, but none are seen. 2. Cardiomegaly. Some coronary artery calcification. 3. Widespread patchy pulmonary infiltrates consistent with the clinical history of viral pneumonia. Fairly dense consolidation in the left lower lobe could possibly be superimposed bacterial pneumonia.  CT Head - no acute changes, questionable MCA nonspecific finding.  MRA -       PUD Prophylaxis :  PPI  Disposition Plan  :    Status is: Inpatient  Remains inpatient appropriate because:IV treatments appropriate due to intensity of illness or inability to take PO   Dispo: The patient is from: Home              Anticipated d/c is to: SNF              Anticipated d/c date is: 2 days              Patient currently is not medically stable to d/c.   Difficult to place patient No  DVT Prophylaxis  :  Lovenox    Lab Results  Component Value Date   PLT 300 08/16/2020    Diet :  Diet Order            Diet Carb Modified Fluid consistency: Thin; Room  service appropriate? Yes  Diet effective now                  Inpatient Medications  Scheduled Meds: . aerochamber plus with mask  1 each Other Once  . aspirin  81 mg Oral Daily  . atenolol  25 mg Oral Daily  . atorvastatin  40 mg Oral QPM  . chlorhexidine  15 mL Mouth Rinse BID  . enoxaparin (LOVENOX) injection  50 mg Subcutaneous Q24H  . insulin aspart  0-15 Units Subcutaneous TID WC  . insulin aspart  0-5 Units Subcutaneous QHS  . insulin aspart protamine- aspart  50 Units Subcutaneous BID WC  . mouth rinse  15 mL Mouth Rinse q12n4p  . methylPREDNISolone (SOLU-MEDROL) injection  40 mg Intravenous Q12H  . mometasone-formoterol  2 puff Inhalation BID  . olopatadine  1 drop Both Eyes BID  . pantoprazole  40 mg Oral QHS  . topiramate  75 mg Oral BID   Continuous Infusions:  PRN Meds:.acetaminophen, albuterol, guaiFENesin-dextromethorphan, loperamide, ondansetron (ZOFRAN) IV  Antibiotics  :    Anti-infectives (From admission, onward)   Start     Dose/Rate Route Frequency Ordered Stop   08/11/20 1000  remdesivir 100 mg in sodium chloride 0.9 % 100 mL IVPB       "Followed by" Linked Group Details   100 mg 200 mL/hr over 30 Minutes Intravenous Daily 08/10/20 1614 08/14/20 1007   08/10/20 1700  remdesivir 200 mg in sodium chloride 0.9% 250 mL IVPB       "Followed by" Linked Group Details   200 mg 580 mL/hr over 30 Minutes Intravenous Once 08/10/20 1614 08/10/20 2051   08/10/20 1600  cefTRIAXone (ROCEPHIN) 2 g in sodium chloride 0.9 % 100 mL IVPB  Status:  Discontinued        2 g 200 mL/hr over 30 Minutes Intravenous Every 24 hours 08/10/20 0513 08/10/20 0839   08/10/20 0600  azithromycin (ZITHROMAX) 250 mg in dextrose 5 % 125 mL IVPB  Status:  Discontinued        250 mg 125 mL/hr over 60 Minutes Intravenous Every 24 hours 08/10/20 0514 08/10/20 0839   08/10/20 0515  doxycycline (VIBRAMYCIN) 100 mg in sodium chloride 0.9 % 250 mL IVPB  Status:  Discontinued        100  mg 125 mL/hr over 120 Minutes Intravenous Every 12 hours 08/10/20 0513 08/10/20 0514   08/10/20 0215  cefTRIAXone (ROCEPHIN) 1 g in sodium chloride 0.9 % 100 mL IVPB        1 g 200 mL/hr over 30 Minutes Intravenous  Once 08/10/20 0202 08/10/20 0405        Alaijah Gibler M.D on 08/16/2020 at 1:50 PM  To page go to [email or web address]   [facility] -  Office  [phone]    See all Orders from today for further details    Objective:   Vitals:   08/15/20 2345 08/16/20 0350 08/16/20 0729 08/16/20 1151  BP: 131/61 103/79 114/65 (!) 121/49  Pulse: 84 84 80 83  Resp: 19 (!) 25 20 18   Temp: 97.8 F (36.6 C) 98.8 F (37.1 C) 99 F (37.2 C) 98 F (36.7 C)  TempSrc: Oral Oral Oral Oral  SpO2: 92% 93% 99% 100%  Weight:      Height:        Wt Readings from Last 3 Encounters:  08/09/20 107.5 kg  08/01/20 107.5 kg  06/13/20 107.9 kg     Intake/Output Summary (Last 24 hours) at 08/16/2020 1350 Last data filed at 08/15/2020 1435 Gross per 24 hour  Intake 240 ml  Output -  Net 240 ml     Physical Exam  Awake Alert, Oriented X 3, No new F.N deficits, Normal affect Symmetrical Chest wall movement, Good air movement bilaterally, CTAB RRR,No Gallops,Rubs or new Murmurs, No Parasternal Heave +ve B.Sounds, Abd Soft, No tenderness, No rebound - guarding or rigidity. No Cyanosis, Clubbing or edema, No new Rash or bruise        Data Review:    CBC Recent Labs  Lab 08/11/20 0500 08/12/20 0410 08/13/20 0108 08/14/20 0129 08/15/20 0146 08/16/20 0201  WBC 6.2 6.0 5.9 5.8 4.3 4.7  HGB 12.1 12.7 12.8 12.9 14.3 14.0  HCT 39.0 42.4 43.0 42.8 45.8 43.5  PLT 215 238 252 263 273 300  MCV 90.3 90.0 90.1 89.2 88.6 86.0  MCH 28.0 27.0 26.8 26.9 27.7 27.7  MCHC 31.0 30.0 29.8* 30.1 31.2 32.2  RDW 14.7 14.6 14.4 14.3 14.4 14.1  LYMPHSABS 1.0  --  1.0 1.0 0.9 0.9  MONOABS 0.3  --  0.3 0.2 0.3 0.4  EOSABS 0.0  --  0.0 0.0 0.0 0.0  BASOSABS 0.0  --  0.0 0.0 0.0 0.0     Recent Labs  Lab 08/09/20 2315 08/09/20 2328 08/11/20 0500 08/12/20 0410 08/12/20 0839 08/13/20 0108 08/14/20 0129 08/15/20 0146 08/16/20 0133 08/16/20 0201  NA 136   < > 144 144  --  145 144 147*  --  143  K 3.8   < > 3.9 3.7  --  3.9 4.5 3.9  --  3.7  CL 94*  --  99 100  --  100 100 97*  --  100  CO2 29  --  33* 33*  --  34* 32 40*  --  34*  GLUCOSE 379*  --  114* 183*  --  104* 191* 59*  --  148*  BUN 28*  --  30* 29*  --  33* 32* 30*  --  28*  CREATININE 1.43*  --  1.25* 1.05*  --  1.15* 1.09* 1.03*  --  1.16*  CALCIUM 9.1  --  9.0 9.1  --  9.3 8.8* 9.2  --  9.3  AST 42*  --  39  --   --  30 33 22  --  23  ALT 28  --  29  --   --  27 24 28   --  29  ALKPHOS 65  --  55  --   --  48 44 52  --  52  BILITOT 0.4  --  0.4  --   --  0.6 1.2 0.5  --  0.9  ALBUMIN 3.4*  --  2.8*  --   --  2.7* 2.8* 3.0*  --  3.1*  MG  --    < >  --   --  2.3 2.3 2.2 2.5*  --  2.2  CRP 12.1*  --  6.9* 2.6* 2.1* 1.1* 0.7 0.6  --  <0.5  DDIMER 1.33*  --  1.52* 1.00* 1.03* 0.92* 1.20* 1.41*  --  1.16*  PROCALCITON <0.10  --   --   --  <0.10 <0.10 <0.10  --   --   --   LATICACIDVEN 2.1*  --   --   --   --   --   --   --   --   --   BNP  --   --   --   --  28.9 419.3* 130.4* 31.1 23.6  --    < > = values in this interval not displayed.    ------------------------------------------------------------------------------------------------------------------ No results for input(s): CHOL, HDL, LDLCALC, TRIG, CHOLHDL, LDLDIRECT in the last 72 hours.  Lab Results  Component Value Date   HGBA1C 9.9 (A) 05/14/2020   ------------------------------------------------------------------------------------------------------------------ No results for input(s): TSH, T4TOTAL, T3FREE, THYROIDAB in the last 72 hours.  Invalid input(s): FREET3  Cardiac Enzymes No results for input(s): CKMB, TROPONINI, MYOGLOBIN in the last 168 hours.  Invalid input(s):  CK ------------------------------------------------------------------------------------------------------------------    Component Value Date/Time   BNP 23.6 08/16/2020 0133    Micro Results Recent Results (from the past 240 hour(s))  Blood Culture (routine x 2)     Status: None   Collection Time: 08/09/20 11:18 PM   Specimen: BLOOD RIGHT FOREARM  Result Value Ref Range Status   Specimen Description BLOOD RIGHT FOREARM  Final   Special Requests   Final    BOTTLES DRAWN AEROBIC AND ANAEROBIC Blood Culture adequate volume   Culture   Final    NO GROWTH 5 DAYS Performed at [facility], [address] [place], [place] [address]    Report Status 08/15/2020 FINAL  Final  Blood Culture (routine x 2)     Status: None   Collection Time: 08/09/20 11:50 PM   Specimen: BLOOD LEFT FOREARM  Result Value Ref Range Status   Specimen Description BLOOD LEFT FOREARM  Final   Special Requests   Final    BOTTLES DRAWN AEROBIC AND ANAEROBIC Blood Culture adequate volume   Culture   Final    NO GROWTH 5 DAYS Performed at [facility], [place] [address] [place], [place] [address]    Report Status 08/15/2020 FINAL  Final  MRSA PCR Screening     Status: None   Collection Time: 08/11/20 11:56 PM   Specimen: Nasal Mucosa; Nasopharyngeal  Result Value Ref Range Status   MRSA by PCR NEGATIVE NEGATIVE Final    Comment:        The GeneXpert MRSA Assay (FDA approved for NASAL specimens only), is one component  of a comprehensive MRSA colonization surveillance program. It is not intended to diagnose MRSA infection nor to guide or monitor treatment for MRSA infections. Performed at [facility], [place] [address] [place], [place] [address]     Radiology Reports DG Chest 2 View  Addendum Date: 08/01/2020   ADDENDUM REPORT: 08/01/2020 15:09 ADDENDUM: Additional history: COPD, asthma Electronically Signed   By: Lavonia Dana M.D.   On: 08/01/2020 15:09   Result Date:  08/01/2020 CLINICAL DATA:  Shortness of breath, sore throat, fever and headache for 3 days, of both grandchildren tested positive for COVID-19 yesterday EXAM: CHEST - 2 VIEW COMPARISON:  02/28/2020 FINDINGS: Enlargement of cardiac silhouette. Mediastinal contours and pulmonary vascularity normal. Minimal linear atelectasis at lingula and RIGHT base. Central peribronchial thickening without definite pulmonary infiltrate, pleural effusion, or pneumothorax. Bones demineralized. IMPRESSION: Enlargement of cardiac silhouette with minimal bibasilar atelectasis. Electronically Signed: By: Lavonia Dana M.D. On: 08/01/2020 15:06   CT Head Wo Contrast  Result Date: 08/10/2020 CLINICAL DATA:  Mental status changes.  Coronavirus infection. EXAM: CT HEAD WITHOUT CONTRAST TECHNIQUE: Contiguous axial images were obtained from the base of the skull through the vertex without intravenous contrast. COMPARISON:  01/27/2018 FINDINGS: Brain: No evidence of accelerated atrophy. No evidence of acute infarction, intra-axial mass lesion, hemorrhage, hydrocephalus or extra-axial collection 1 cm calcified meningioma at the right frontoparietal convexity, not significant. Vascular: Question a hyperdense MCA branch on the left in the sylvian fissure region. This is not a definite finding. Skull: Normal Sinuses/Orbits: Clear Other: None IMPRESSION: 1. No acute brain parenchymal finding by CT. Question a hyperdense MCA branch on the left in the Sylvian fissure region. This is not a definite finding. If there is clinical concern about a left MCA territory acute infarction, consider CT angiography. 2. 1 cm calcified meningioma at the right frontoparietal convexity, not significant. Electronically Signed   By: Nelson Chimes M.D.   On: 08/10/2020 02:52   CT Angio Chest PE W and/or Wo Contrast  Result Date: 08/10/2020 CLINICAL DATA:  Coronavirus pneumonia.  Question pulmonary emboli. EXAM: CT ANGIOGRAPHY CHEST WITH CONTRAST TECHNIQUE:  Multidetector CT imaging of the chest was performed using the standard protocol during bolus administration of intravenous contrast. Multiplanar CT image reconstructions and MIPs were obtained to evaluate the vascular anatomy. CONTRAST:  59mL OMNIPAQUE IOHEXOL 350 MG/ML SOLN COMPARISON:  Chest radiography yesterday. FINDINGS: Cardiovascular: Cardiomegaly. No pericardial fluid. Some coronary artery calcification. No visible thoracic aortic atherosclerotic calcification. Pulmonary arterial opacification is moderate. There are no large pulmonary emboli. Small peripheral emboli might be inapparent, but none are seen. Mediastinum/Nodes: No mass or lymphadenopathy. Lungs/Pleura: No pleural effusion. Widespread patchy pulmonary infiltrates consistent with the clinical history of viral pneumonia. Fairly dense consolidation in the left lower lobe. There could possibly be superimposed bacterial pneumonia. Upper Abdomen: Left renal cyst.  Not completely evaluated. Musculoskeletal: Negative Review of the MIP images confirms the above findings. IMPRESSION: 1. Pulmonary arterial opacification is moderate. No large pulmonary emboli. Small peripheral emboli might be inapparent, but none are seen. 2. Cardiomegaly. Some coronary artery calcification. 3. Widespread patchy pulmonary infiltrates consistent with the clinical history of viral pneumonia. Fairly dense consolidation in the left lower lobe could possibly be superimposed bacterial pneumonia. Electronically Signed   By: Nelson Chimes M.D.   On: 08/10/2020 01:48   MR ANGIO HEAD WO CONTRAST  Result Date: 08/14/2020 CLINICAL DATA:  Transient ischemic attack.  Encephalopathy. EXAM: MRA HEAD WITHOUT CONTRAST TECHNIQUE: Angiographic images of the Circle  of Willis were obtained using MRA technique without intravenous contrast. COMPARISON:  None. FINDINGS: POSTERIOR CIRCULATION: --Vertebral arteries: Normal --Inferior cerebellar arteries: Normal. --Basilar artery: Normal. --Superior  cerebellar arteries: Normal. --Posterior cerebral arteries: Normal. ANTERIOR CIRCULATION: --Intracranial internal carotid arteries: Normal. --Anterior cerebral arteries (ACA): Normal. --Middle cerebral arteries (MCA): Normal. ANATOMIC VARIANTS: Both posterior communicating arteries are patent IMPRESSION: Normal intracranial MRA. Electronically Signed   By: Ulyses Jarred M.D.   On: 08/14/2020 01:39   MR BRAIN WO CONTRAST  Result Date: 08/10/2020 CLINICAL DATA:  Mental status change with unknown cause. EXAM: MRI HEAD WITHOUT CONTRAST TECHNIQUE: Multiplanar, multiecho pulse sequences of the brain and surrounding structures were obtained without intravenous contrast. COMPARISON:  01/27/2018 FINDINGS: Brain: No acute infarction, hemorrhage, hydrocephalus, extra-axial collection or mass lesion. Mild for age chronic small vessel ischemic type change in the periventricular white matter and pons. Age normal brain volume Vascular: Normal flow voids Skull and upper cervical spine: Normal marrow signal Sinuses/Orbits: Bilateral cataract resection.  No acute finding IMPRESSION: No acute finding or cause for symptoms. Electronically Signed   By: Monte Fantasia M.D.   On: 08/10/2020 10:10   DG Chest Port 1 View  Result Date: 08/13/2020 CLINICAL DATA:  Shortness of breath, cough, COVID EXAM: PORTABLE CHEST 1 VIEW COMPARISON:  08/12/2020 FINDINGS: Bilateral pulmonary opacities remain present with similar lung aeration. No significant pleural effusion. No pneumothorax. Stable cardiomediastinal contours. IMPRESSION: Similar appearance of bilateral pulmonary opacities. Electronically Signed   By: Macy Mis M.D.   On: 08/13/2020 10:27   DG Chest Port 1 View  Result Date: 08/12/2020 CLINICAL DATA:  Hypoxia.  COVID. EXAM: PORTABLE CHEST 1 VIEW COMPARISON:  08/09/2020 FINDINGS: 0520 hours. The cardio pericardial silhouette is enlarged. Low volume film with patchy bilateral airspace disease superimposed on underlying diffuse  interstitial coarsening. No substantial pleural effusion. No pneumothorax. Telemetry leads overlie the chest. IMPRESSION: No substantial interval change. Electronically Signed   By: Misty [name] M.D.   On: 08/12/2020 05:27   DG Chest Port 1 View  Result Date: 08/09/2020 CLINICAL DATA:  70 year old female with shortness of breath and cough. Positive COVID-19 EXAM: PORTABLE CHEST 1 VIEW COMPARISON:  Chest radiograph dated 08/01/2020 FINDINGS: Bilateral confluent and streaky airspace opacities consistent with multilobar pneumonia and in keeping with COVID-19. No pleural effusion pneumothorax. Stable cardiomegaly. No acute osseous pathology. IMPRESSION: Multilobar pneumonia. Electronically Signed   By: Anner Crete M.D.   On: 08/09/2020 22:57   DG Abd Portable 1V  Result Date: 08/13/2020 CLINICAL DATA:  Nausea. EXAM: PORTABLE ABDOMEN - 1 VIEW COMPARISON:  None. FINDINGS: No gaseous small moderate gaseous distention of the stomach noted bowel dilatation. Air is seen scattered along the nondilated transverse and left colon. Multiple phleboliths are seen in the right paramidline abdomen and along the pelvic sidewalls bilaterally. Visualized bony anatomy unremarkable. IMPRESSION: No findings to suggest bowel obstruction. Electronically Signed   By: Misty [name] M.D.   On: 08/13/2020 10:36

## 2020-08-17 DIAGNOSIS — U071 COVID-19: Secondary | ICD-10-CM | POA: Diagnosis not present

## 2020-08-17 DIAGNOSIS — G9349 Other encephalopathy: Secondary | ICD-10-CM | POA: Diagnosis not present

## 2020-08-17 LAB — GLUCOSE, CAPILLARY
Glucose-Capillary: 197 mg/dL — ABNORMAL HIGH (ref 70–99)
Glucose-Capillary: 204 mg/dL — ABNORMAL HIGH (ref 70–99)
Glucose-Capillary: 195 mg/dL — ABNORMAL HIGH (ref 70–99)
Glucose-Capillary: 170 mg/dL — ABNORMAL HIGH (ref 70–99)

## 2020-08-17 NOTE — Plan of Care (Signed)
  Problem: Coping: Goal: Psychosocial and spiritual needs will be supported Outcome: Progressing   Problem: Respiratory: Goal: Will maintain a patent airway Outcome: Progressing Goal: Complications related to the disease process, condition or treatment will be avoided or minimized Outcome: Progressing   Problem: Education: Goal: Knowledge of General Education information will improve Description: Including pain rating scale, medication(s)/side effects and non-pharmacologic comfort measures Outcome: Progressing   Problem: Health Behavior/Discharge Planning: Goal: Ability to manage health-related needs will improve Outcome: Progressing   Problem: Clinical Measurements: Goal: Ability to maintain clinical measurements within normal limits will improve Outcome: Progressing Goal: Will remain free from infection Outcome: Progressing Goal: Diagnostic test results will improve Outcome: Progressing Goal: Respiratory complications will improve Outcome: Progressing Goal: Cardiovascular complication will be avoided Outcome: Progressing   Problem: Activity: Goal: Risk for activity intolerance will decrease Outcome: Progressing   Problem: Nutrition: Goal: Adequate nutrition will be maintained Outcome: Progressing   Problem: Coping: Goal: Level of anxiety will decrease Outcome: Progressing   Problem: Elimination: Goal: Will not experience complications related to bowel motility Outcome: Progressing Goal: Will not experience complications related to urinary retention Outcome: Progressing   Problem: Pain Managment: Goal: General experience of comfort will improve Outcome: Progressing   Problem: Safety: Goal: Ability to remain free from injury will improve Outcome: Progressing   Problem: Skin Integrity: Goal: Risk for impaired skin integrity will decrease Outcome: Progressing

## 2020-08-17 NOTE — Progress Notes (Signed)
PROGRESS NOTE                                                                                                                                                                                                             Patient Demographics:    Heidi [name], is a 70 y.o. female, DOB - 06-11-1951, ZOX:[id number]  Outpatient Primary MD for the patient is Ladell Pier, MD    LOS - 7  Admit date - 08/09/2020    Chief Complaint  Patient presents with  . Altered Mental Status       Brief Narrative (HPI from H&P)  70 year old lady prior history of asthma, diastolic heart failure, hypertension, stage IIIa CKD, insulin-dependent diabetes mellitus, morbid obesity presents with shortness of breath and chest pain. Chest x-ray shows hazy bilateral opacities in the lung bases concerning for atypical infectious process such as viral pneumonia.   Subjective:   Patient seen in chair today, reports generalized weakness and fatigue, reports her dyspnea at baseline    Assessment  & Plan :    Acute Hypoxic Resp. Failure due to Acute Covid 19 Viral Pneumonitis + Acute on Chronic Diastolic CHF - - she unfortunately not vaccinated for Covid  - she seems to have incurred moderate to severe parenchymal lung injury.  She has improved, she is currently on 2 L nasal cannula which is her baseline.  -She is treated with IV steroids. - She is being treated with IV remdesivir . -Received Actemra 2/5 -Euvolemic, will hold on Lasix today  Encouraged the patient to sit up in chair in the daytime use I-S and flutter valve for pulmonary toiletry and then prone in bed when at night.  Will advance activity and titrate down oxygen as possible.  Actemra/Baricitinib  off label use - patient was told that if COVID-19 pneumonitis gets worse we might potentially use Actemra off label, patient denies any known history of active diverticulitis, tuberculosis or  hepatitis, understands the risks and benefits and wants to proceed with Actemra treatment if required.   SpO2: 92 % O2 Flow Rate (L/min): 2 L/min FiO2 (%): 100 %  Recent Labs  Lab 08/11/20 0500 08/12/20 0410 08/12/20 0839 08/13/20 0108 08/14/20 0129 08/15/20 0146 08/16/20 0133 08/16/20 0201  WBC 6.2 6.0  --  5.9 5.8 4.3  --  4.7  HGB 12.1  12.7  --  12.8 12.9 14.3  --  14.0  HCT 39.0 42.4  --  43.0 42.8 45.8  --  43.5  PLT 215 238  --  252 263 273  --  300  CRP 6.9* 2.6* 2.1* 1.1* 0.7 0.6  --  <0.5  BNP  --   --  28.9 419.3* 130.4* 31.1 23.6  --   DDIMER 1.52* 1.00* 1.03* 0.92* 1.20* 1.41*  --  1.16*  PROCALCITON  --   --  <0.10 <0.10 <0.10  --   --   --   AST 39  --   --  30 33 22  --  23  ALT 29  --   --  27 24 28   --  29  ALKPHOS 55  --   --  48 44 52  --  52  BILITOT 0.4  --   --  0.6 1.2 0.5  --  0.9  ALBUMIN 2.8*  --   --  2.7* 2.8* 3.0*  --  3.1*    2.  Acute on chronic diastolic CHF EF 36% on recent echocardiogram. Continue beta-blocker,  -She is on IV Lasix as needed, no need for Lasix today  3.  Breast nodule on CT angiogram of May 2021 -Patient had mammogram done 05/01/2020, with evidence of multiple benign bilateral breast nodules, no evidence of malignancy, with recommendation to repeat mammogram in 1 year, have discussed this with the patient, she is aware of these findings and the need to repeat mammogram.  4.  CT head showing possible MCA nonspecific finding.  MRI brain/MRA head with no acute findings.  5.  Thyroid nodule.  TSH normal, has had biopsy done in the past and deemed to be benign.  Follow with PCP.  6.  Dyslipidemia.  On Lipitor.  7.  AKI on CKD 2. AKI much improved, baseline creatinine around 1.2.  8.   Morbid obesity with underlying OSA.  BMI of 40, follow with PCP for weight loss, counseled on compliance with nighttime CPAP in the hospital, per pulmonary who had seen the patient initially will benefit from nighttime CPAP.  Outpatient sleep  study pulmonary follow-up.  9. DM type II.  On Lantus and sliding scale monitor and adjust.  Labile, with CBG of 62 yesterday, so I will hold on increasing her insulin  Lab Results  Component Value Date   HGBA1C 9.9 (A) 05/14/2020   CBG (last 3)  Recent Labs    08/16/20 2032 08/17/20 0742 08/17/20 1234  GLUCAP 229* 197* 204*   10.  Hypernatremia -Resolved.      Condition - Extremely Guarded  Family Communication  : Discussed with son by phone 2/11  Code Status :  Full  Consults  :  PCCM   Procedures  :     CTA - 1. Pulmonary arterial opacification is moderate. No large pulmonary emboli. Small peripheral emboli might be inapparent, but none are seen. 2. Cardiomegaly. Some coronary artery calcification. 3. Widespread patchy pulmonary infiltrates consistent with the clinical history of viral pneumonia. Fairly dense consolidation in the left lower lobe could possibly be superimposed bacterial pneumonia.  CT Head - no acute changes, questionable MCA nonspecific finding.  MRA -       PUD Prophylaxis :  PPI  Disposition Plan  :    Status is: Inpatient  Remains inpatient appropriate because:IV treatments appropriate due to intensity of illness or inability to take PO   Dispo: The patient is from:  Home              Anticipated d/c is to: Home              Anticipated d/c date is: 1 day              Patient currently is not medically stable to d/c.   Difficult to place patient No  DVT Prophylaxis  :  Lovenox    Lab Results  Component Value Date   PLT 300 08/16/2020    Diet :  Diet Order            Diet Carb Modified Fluid consistency: Thin; Room service appropriate? Yes  Diet effective now                  Inpatient Medications  Scheduled Meds: . aerochamber plus with mask  1 each Other Once  . aspirin  81 mg Oral Daily  . atenolol  25 mg Oral Daily  . atorvastatin  40 mg Oral QPM  . chlorhexidine  15 mL Mouth Rinse BID  . enoxaparin (LOVENOX)  injection  50 mg Subcutaneous Q24H  . insulin aspart  0-15 Units Subcutaneous TID WC  . insulin aspart  0-5 Units Subcutaneous QHS  . insulin aspart protamine- aspart  50 Units Subcutaneous BID WC  . mouth rinse  15 mL Mouth Rinse q12n4p  . methylPREDNISolone (SOLU-MEDROL) injection  40 mg Intravenous Q12H  . mometasone-formoterol  2 puff Inhalation BID  . olopatadine  1 drop Both Eyes BID  . pantoprazole  40 mg Oral QHS  . topiramate  75 mg Oral BID   Continuous Infusions:  PRN Meds:.acetaminophen, albuterol, guaiFENesin-dextromethorphan, loperamide, ondansetron (ZOFRAN) IV  Antibiotics  :    Anti-infectives (From admission, onward)   Start     Dose/Rate Route Frequency Ordered Stop   08/11/20 1000  remdesivir 100 mg in sodium chloride 0.9 % 100 mL IVPB       "Followed by" Linked Group Details   100 mg 200 mL/hr over 30 Minutes Intravenous Daily 08/10/20 1614 08/14/20 1007   08/10/20 1700  remdesivir 200 mg in sodium chloride 0.9% 250 mL IVPB       "Followed by" Linked Group Details   200 mg 580 mL/hr over 30 Minutes Intravenous Once 08/10/20 1614 08/10/20 2051   08/10/20 1600  cefTRIAXone (ROCEPHIN) 2 g in sodium chloride 0.9 % 100 mL IVPB  Status:  Discontinued        2 g 200 mL/hr over 30 Minutes Intravenous Every 24 hours 08/10/20 0513 08/10/20 0839   08/10/20 0600  azithromycin (ZITHROMAX) 250 mg in dextrose 5 % 125 mL IVPB  Status:  Discontinued        250 mg 125 mL/hr over 60 Minutes Intravenous Every 24 hours 08/10/20 0514 08/10/20 0839   08/10/20 0515  doxycycline (VIBRAMYCIN) 100 mg in sodium chloride 0.9 % 250 mL IVPB  Status:  Discontinued        100 mg 125 mL/hr over 120 Minutes Intravenous Every 12 hours 08/10/20 0513 08/10/20 0514   08/10/20 0215  cefTRIAXone (ROCEPHIN) 1 g in sodium chloride 0.9 % 100 mL IVPB        1 g 200 mL/hr over 30 Minutes Intravenous  Once 08/10/20 0202 08/10/20 0405        Donney Caraveo M.D on 08/17/2020 at 2:08 PM  To page go  to [email or web address]   [facility] -  Office  [phone]    See  all Orders from today for further details    Objective:   Vitals:   08/16/20 2326 08/17/20 0323 08/17/20 0743 08/17/20 1200  BP: 110/65 112/65 119/74 94/77  Pulse: 71 84 82 72  Resp: 20 20 20 20   Temp: 98.1 F (36.7 C) (!) 97.5 F (36.4 C) 98.2 F (36.8 C) 98.1 F (36.7 C)  TempSrc: Axillary Oral Oral Oral  SpO2: 98% 100% 100% 92%  Weight:      Height:        Wt Readings from Last 3 Encounters:  08/09/20 107.5 kg  08/01/20 107.5 kg  06/13/20 107.9 kg     Intake/Output Summary (Last 24 hours) at 08/17/2020 1408 Last data filed at 08/17/2020 1356 Gross per 24 hour  Intake 780 ml  Output 1 ml  Net 779 ml     Physical Exam  Awake Alert, Oriented X 3, frail, no new F.N deficits, Normal affect Symmetrical Chest wall movement, Good air movement bilaterally, CTAB RRR,No Gallops,Rubs or new Murmurs, No Parasternal Heave +ve B.Sounds, Abd Soft, No tenderness, No rebound - guarding or rigidity. No Cyanosis, Clubbing or edema, No new Rash or bruise      Data Review:    CBC Recent Labs  Lab 08/11/20 0500 08/12/20 0410 08/13/20 0108 08/14/20 0129 08/15/20 0146 08/16/20 0201  WBC 6.2 6.0 5.9 5.8 4.3 4.7  HGB 12.1 12.7 12.8 12.9 14.3 14.0  HCT 39.0 42.4 43.0 42.8 45.8 43.5  PLT 215 238 252 263 273 300  MCV 90.3 90.0 90.1 89.2 88.6 86.0  MCH 28.0 27.0 26.8 26.9 27.7 27.7  MCHC 31.0 30.0 29.8* 30.1 31.2 32.2  RDW 14.7 14.6 14.4 14.3 14.4 14.1  LYMPHSABS 1.0  --  1.0 1.0 0.9 0.9  MONOABS 0.3  --  0.3 0.2 0.3 0.4  EOSABS 0.0  --  0.0 0.0 0.0 0.0  BASOSABS 0.0  --  0.0 0.0 0.0 0.0    Recent Labs  Lab 08/11/20 0500 08/12/20 0410 08/12/20 0839 08/13/20 0108 08/14/20 0129 08/15/20 0146 08/16/20 0133 08/16/20 0201  NA 144 144  --  145 144 147*  --  143  K 3.9 3.7  --  3.9 4.5 3.9  --  3.7  CL 99 100  --  100 100 97*  --  100  CO2 33* 33*  --  34* 32 40*  --  34*  GLUCOSE 114* 183*  --   104* 191* 59*  --  148*  BUN 30* 29*  --  33* 32* 30*  --  28*  CREATININE 1.25* 1.05*  --  1.15* 1.09* 1.03*  --  1.16*  CALCIUM 9.0 9.1  --  9.3 8.8* 9.2  --  9.3  AST 39  --   --  30 33 22  --  23  ALT 29  --   --  27 24 28   --  29  ALKPHOS 55  --   --  48 44 52  --  52  BILITOT 0.4  --   --  0.6 1.2 0.5  --  0.9  ALBUMIN 2.8*  --   --  2.7* 2.8* 3.0*  --  3.1*  MG  --   --  2.3 2.3 2.2 2.5*  --  2.2  CRP 6.9* 2.6* 2.1* 1.1* 0.7 0.6  --  <0.5  DDIMER 1.52* 1.00* 1.03* 0.92* 1.20* 1.41*  --  1.16*  PROCALCITON  --   --  <0.10 <0.10 <0.10  --   --   --  BNP  --   --  28.9 419.3* 130.4* 31.1 23.6  --     ------------------------------------------------------------------------------------------------------------------ No results for input(s): CHOL, HDL, LDLCALC, TRIG, CHOLHDL, LDLDIRECT in the last 72 hours.  Lab Results  Component Value Date   HGBA1C 9.9 (A) 05/14/2020   ------------------------------------------------------------------------------------------------------------------ No results for input(s): TSH, T4TOTAL, T3FREE, THYROIDAB in the last 72 hours.  Invalid input(s): FREET3  Cardiac Enzymes No results for input(s): CKMB, TROPONINI, MYOGLOBIN in the last 168 hours.  Invalid input(s): CK ------------------------------------------------------------------------------------------------------------------    Component Value Date/Time   BNP 23.6 08/16/2020 0133    Micro Results Recent Results (from the past 240 hour(s))  Blood Culture (routine x 2)     Status: None   Collection Time: 08/09/20 11:18 PM   Specimen: BLOOD RIGHT FOREARM  Result Value Ref Range Status   Specimen Description BLOOD RIGHT FOREARM  Final   Special Requests   Final    BOTTLES DRAWN AEROBIC AND ANAEROBIC Blood Culture adequate volume   Culture   Final    NO GROWTH 5 DAYS Performed at [facility], [address], [place], [place] [address]    Report Status 08/15/2020 FINAL  Final   Blood Culture (routine x 2)     Status: None   Collection Time: 08/09/20 11:50 PM   Specimen: BLOOD LEFT FOREARM  Result Value Ref Range Status   Specimen Description BLOOD LEFT FOREARM  Final   Special Requests   Final    BOTTLES DRAWN AEROBIC AND ANAEROBIC Blood Culture adequate volume   Culture   Final    NO GROWTH 5 DAYS Performed at [facility], [place] [address], [place], [place] [address]    Report Status 08/15/2020 FINAL  Final  MRSA PCR Screening     Status: None   Collection Time: 08/11/20 11:56 PM   Specimen: Nasal Mucosa; Nasopharyngeal  Result Value Ref Range Status   MRSA by PCR NEGATIVE NEGATIVE Final    Comment:        The GeneXpert MRSA Assay (FDA approved for NASAL specimens only), is one component of a comprehensive MRSA colonization surveillance program. It is not intended to diagnose MRSA infection nor to guide or monitor treatment for MRSA infections. Performed at [facility], [place] [address], [place], [place] [address]     Radiology Reports DG Chest 2 View  Addendum Date: 08/01/2020   ADDENDUM REPORT: 08/01/2020 15:09 ADDENDUM: Additional history: COPD, asthma Electronically Signed   By: Lavonia Dana M.D.   On: 08/01/2020 15:09   Result Date: 08/01/2020 CLINICAL DATA:  Shortness of breath, sore throat, fever and headache for 3 days, of both grandchildren tested positive for COVID-19 yesterday EXAM: CHEST - 2 VIEW COMPARISON:  02/28/2020 FINDINGS: Enlargement of cardiac silhouette. Mediastinal contours and pulmonary vascularity normal. Minimal linear atelectasis at lingula and RIGHT base. Central peribronchial thickening without definite pulmonary infiltrate, pleural effusion, or pneumothorax. Bones demineralized. IMPRESSION: Enlargement of cardiac silhouette with minimal bibasilar atelectasis. Electronically Signed: By: Lavonia Dana M.D. On: 08/01/2020 15:06   CT Head Wo Contrast  Result Date: 08/10/2020 CLINICAL DATA:  Mental status changes.   Coronavirus infection. EXAM: CT HEAD WITHOUT CONTRAST TECHNIQUE: Contiguous axial images were obtained from the base of the skull through the vertex without intravenous contrast. COMPARISON:  01/27/2018 FINDINGS: Brain: No evidence of accelerated atrophy. No evidence of acute infarction, intra-axial mass lesion, hemorrhage, hydrocephalus or extra-axial collection 1 cm calcified meningioma at the right frontoparietal convexity, not significant.  Vascular: Question a hyperdense MCA branch on the left in the sylvian fissure region. This is not a definite finding. Skull: Normal Sinuses/Orbits: Clear Other: None IMPRESSION: 1. No acute brain parenchymal finding by CT. Question a hyperdense MCA branch on the left in the Sylvian fissure region. This is not a definite finding. If there is clinical concern about a left MCA territory acute infarction, consider CT angiography. 2. 1 cm calcified meningioma at the right frontoparietal convexity, not significant. Electronically Signed   By: Nelson Chimes M.D.   On: 08/10/2020 02:52   CT Angio Chest PE W and/or Wo Contrast  Result Date: 08/10/2020 CLINICAL DATA:  Coronavirus pneumonia.  Question pulmonary emboli. EXAM: CT ANGIOGRAPHY CHEST WITH CONTRAST TECHNIQUE: Multidetector CT imaging of the chest was performed using the standard protocol during bolus administration of intravenous contrast. Multiplanar CT image reconstructions and MIPs were obtained to evaluate the vascular anatomy. CONTRAST:  30mL OMNIPAQUE IOHEXOL 350 MG/ML SOLN COMPARISON:  Chest radiography yesterday. FINDINGS: Cardiovascular: Cardiomegaly. No pericardial fluid. Some coronary artery calcification. No visible thoracic aortic atherosclerotic calcification. Pulmonary arterial opacification is moderate. There are no large pulmonary emboli. Small peripheral emboli might be inapparent, but none are seen. Mediastinum/Nodes: No mass or lymphadenopathy. Lungs/Pleura: No pleural effusion. Widespread patchy  pulmonary infiltrates consistent with the clinical history of viral pneumonia. Fairly dense consolidation in the left lower lobe. There could possibly be superimposed bacterial pneumonia. Upper Abdomen: Left renal cyst.  Not completely evaluated. Musculoskeletal: Negative Review of the MIP images confirms the above findings. IMPRESSION: 1. Pulmonary arterial opacification is moderate. No large pulmonary emboli. Small peripheral emboli might be inapparent, but none are seen. 2. Cardiomegaly. Some coronary artery calcification. 3. Widespread patchy pulmonary infiltrates consistent with the clinical history of viral pneumonia. Fairly dense consolidation in the left lower lobe could possibly be superimposed bacterial pneumonia. Electronically Signed   By: Nelson Chimes M.D.   On: 08/10/2020 01:48   MR ANGIO HEAD WO CONTRAST  Result Date: 08/14/2020 CLINICAL DATA:  Transient ischemic attack.  Encephalopathy. EXAM: MRA HEAD WITHOUT CONTRAST TECHNIQUE: Angiographic images of the Circle of Willis were obtained using MRA technique without intravenous contrast. COMPARISON:  None. FINDINGS: POSTERIOR CIRCULATION: --Vertebral arteries: Normal --Inferior cerebellar arteries: Normal. --Basilar artery: Normal. --Superior cerebellar arteries: Normal. --Posterior cerebral arteries: Normal. ANTERIOR CIRCULATION: --Intracranial internal carotid arteries: Normal. --Anterior cerebral arteries (ACA): Normal. --Middle cerebral arteries (MCA): Normal. ANATOMIC VARIANTS: Both posterior communicating arteries are patent IMPRESSION: Normal intracranial MRA. Electronically Signed   By: Ulyses Jarred M.D.   On: 08/14/2020 01:39   MR BRAIN WO CONTRAST  Result Date: 08/10/2020 CLINICAL DATA:  Mental status change with unknown cause. EXAM: MRI HEAD WITHOUT CONTRAST TECHNIQUE: Multiplanar, multiecho pulse sequences of the brain and surrounding structures were obtained without intravenous contrast. COMPARISON:  01/27/2018 FINDINGS: Brain: No  acute infarction, hemorrhage, hydrocephalus, extra-axial collection or mass lesion. Mild for age chronic small vessel ischemic type change in the periventricular white matter and pons. Age normal brain volume Vascular: Normal flow voids Skull and upper cervical spine: Normal marrow signal Sinuses/Orbits: Bilateral cataract resection.  No acute finding IMPRESSION: No acute finding or cause for symptoms. Electronically Signed   By: Monte Fantasia M.D.   On: 08/10/2020 10:10   DG Chest Port 1 View  Result Date: 08/13/2020 CLINICAL DATA:  Shortness of breath, cough, COVID EXAM: PORTABLE CHEST 1 VIEW COMPARISON:  08/12/2020 FINDINGS: Bilateral pulmonary opacities remain present with similar lung aeration. No significant pleural effusion. No pneumothorax. Stable  cardiomediastinal contours. IMPRESSION: Similar appearance of bilateral pulmonary opacities. Electronically Signed   By: Macy Mis M.D.   On: 08/13/2020 10:27   DG Chest Port 1 View  Result Date: 08/12/2020 CLINICAL DATA:  Hypoxia.  COVID. EXAM: PORTABLE CHEST 1 VIEW COMPARISON:  08/09/2020 FINDINGS: 0520 hours. The cardio pericardial silhouette is enlarged. Low volume film with patchy bilateral airspace disease superimposed on underlying diffuse interstitial coarsening. No substantial pleural effusion. No pneumothorax. Telemetry leads overlie the chest. IMPRESSION: No substantial interval change. Electronically Signed   By: Misty [name] M.D.   On: 08/12/2020 05:27   DG Chest Port 1 View  Result Date: 08/09/2020 CLINICAL DATA:  70 year old female with shortness of breath and cough. Positive COVID-19 EXAM: PORTABLE CHEST 1 VIEW COMPARISON:  Chest radiograph dated 08/01/2020 FINDINGS: Bilateral confluent and streaky airspace opacities consistent with multilobar pneumonia and in keeping with COVID-19. No pleural effusion pneumothorax. Stable cardiomegaly. No acute osseous pathology. IMPRESSION: Multilobar pneumonia. Electronically Signed   By: Anner Crete M.D.   On: 08/09/2020 22:57   DG Abd Portable 1V  Result Date: 08/13/2020 CLINICAL DATA:  Nausea. EXAM: PORTABLE ABDOMEN - 1 VIEW COMPARISON:  None. FINDINGS: No gaseous small moderate gaseous distention of the stomach noted bowel dilatation. Air is seen scattered along the nondilated transverse and left colon. Multiple phleboliths are seen in the right paramidline abdomen and along the pelvic sidewalls bilaterally. Visualized bony anatomy unremarkable. IMPRESSION: No findings to suggest bowel obstruction. Electronically Signed   By: Misty [name] M.D.   On: 08/13/2020 10:36

## 2020-08-18 DIAGNOSIS — U071 COVID-19: Secondary | ICD-10-CM | POA: Diagnosis not present

## 2020-08-18 DIAGNOSIS — G9349 Other encephalopathy: Secondary | ICD-10-CM | POA: Diagnosis not present

## 2020-08-18 LAB — GLUCOSE, CAPILLARY
Glucose-Capillary: 55 mg/dL — ABNORMAL LOW (ref 70–99)
Glucose-Capillary: 138 mg/dL — ABNORMAL HIGH (ref 70–99)
Glucose-Capillary: 172 mg/dL — ABNORMAL HIGH (ref 70–99)
Glucose-Capillary: 78 mg/dL (ref 70–99)
Glucose-Capillary: 137 mg/dL — ABNORMAL HIGH (ref 70–99)
Glucose-Capillary: 162 mg/dL — ABNORMAL HIGH (ref 70–99)
Glucose-Capillary: 318 mg/dL — ABNORMAL HIGH (ref 70–99)

## 2020-08-18 MED ORDER — INSULIN ASPART PROT & ASPART (70-30 MIX) 100 UNIT/ML ~~LOC~~ SUSP
35.0000 [IU] | Freq: Two times a day (BID) | SUBCUTANEOUS | Status: DC
  Administered 2020-08-18 – 2020-08-19 (×2): 35 [IU] via SUBCUTANEOUS
  Filled 2020-08-18 (×2): qty 10

## 2020-08-18 NOTE — Progress Notes (Signed)
PROGRESS NOTE                                                                                                                                                                                                             Patient Demographics:    Heidi [name], is a 70 y.o. female, DOB - 26-Oct-1950, WEX:[id number]  Outpatient Primary MD for the patient is Ladell Pier, MD    LOS - 8  Admit date - 08/09/2020    Chief Complaint  Patient presents with  . Altered Mental Status       Brief Narrative (HPI from H&P)  70 year old lady prior history of asthma, diastolic heart failure, hypertension, stage IIIa CKD, insulin-dependent diabetes mellitus, morbid obesity presents with shortness of breath and chest pain. Chest x-ray shows hazy bilateral opacities in the lung bases concerning for atypical infectious process such as viral pneumonia.   Subjective:   Patient seen in chair today, reports generalized weakness and fatigue, reports her dyspnea at baseline    Assessment  & Plan :    Acute Hypoxic Resp. Failure due to Acute Covid 19 Viral Pneumonitis + Acute on Chronic Diastolic CHF - - she unfortunately not vaccinated for Covid  - she seems to have incurred moderate to severe parenchymal lung injury.  She has improved, she is currently on 2 L nasal cannula which is her baseline.  -She is treated with IV steroids. - She is being treated with IV remdesivir . -Received Actemra 2/5 -Euvolemic, will hold on Lasix today  Encouraged the patient to sit up in chair in the daytime use I-S and flutter valve for pulmonary toiletry and then prone in bed when at night.  Will advance activity and titrate down oxygen as possible.  Actemra/Baricitinib  off label use - patient was told that if COVID-19 pneumonitis gets worse we might potentially use Actemra off label, patient denies any known history of active diverticulitis, tuberculosis or  hepatitis, understands the risks and benefits and wants to proceed with Actemra treatment if required.   SpO2: 91 % O2 Flow Rate (L/min): 2 L/min FiO2 (%): 100 %  Recent Labs  Lab 08/12/20 0410 08/12/20 0839 08/13/20 0108 08/14/20 0129 08/15/20 0146 08/16/20 0133 08/16/20 0201  WBC 6.0  --  5.9 5.8 4.3  --  4.7  HGB 12.7  --  12.8 12.9 14.3  --  14.0  HCT 42.4  --  43.0 42.8 45.8  --  43.5  PLT 238  --  252 263 273  --  300  CRP 2.6* 2.1* 1.1* 0.7 0.6  --  <0.5  BNP  --  28.9 419.3* 130.4* 31.1 23.6  --   DDIMER 1.00* 1.03* 0.92* 1.20* 1.41*  --  1.16*  PROCALCITON  --  <0.10 <0.10 <0.10  --   --   --   AST  --   --  30 33 22  --  23  ALT  --   --  27 24 28   --  29  ALKPHOS  --   --  48 44 52  --  52  BILITOT  --   --  0.6 1.2 0.5  --  0.9  ALBUMIN  --   --  2.7* 2.8* 3.0*  --  3.1*    2.  Acute on chronic diastolic CHF EF 77% on recent echocardiogram. Continue beta-blocker,  -She is on IV Lasix as needed, no need for Lasix today  3.  Breast nodule on CT angiogram of May 2021 -Patient had mammogram done 05/01/2020, with evidence of multiple benign bilateral breast nodules, no evidence of malignancy, with recommendation to repeat mammogram in 1 year, have discussed this with the patient, she is aware of these findings and the need to repeat mammogram.  4.  CT head showing possible MCA nonspecific finding.  MRI brain/MRA head with no acute findings.  5.  Thyroid nodule.  TSH normal, has had biopsy done in the past and deemed to be benign.  Follow with PCP.  6.  Dyslipidemia.  On Lipitor.  7.  AKI on CKD 2. AKI much improved, baseline creatinine around 1.2.  8.   Morbid obesity with underlying OSA.  BMI of 40, follow with PCP for weight loss, counseled on compliance with nighttime CPAP in the hospital, per pulmonary who had seen the patient initially will benefit from nighttime CPAP.  Outpatient sleep study pulmonary follow-up.  9. DM type II. Labile this morning, with  low CBG, so I have decreased her NovoLog 7030 to 35 units twice daily  Lab Results  Component Value Date   HGBA1C 9.9 (A) 05/14/2020   CBG (last 3)  Recent Labs    08/18/20 0726 08/18/20 0800 08/18/20 1146  GLUCAP 55* 138* 172*   10.  Hypernatremia -Resolved.      Condition - Extremely Guarded  Family Communication  : Discussed with son by phone 2/11  Code Status :  Full  Consults  :  PCCM   Procedures  :     CTA - 1. Pulmonary arterial opacification is moderate. No large pulmonary emboli. Small peripheral emboli might be inapparent, but none are seen. 2. Cardiomegaly. Some coronary artery calcification. 3. Widespread patchy pulmonary infiltrates consistent with the clinical history of viral pneumonia. Fairly dense consolidation in the left lower lobe could possibly be superimposed bacterial pneumonia.  CT Head - no acute changes, questionable MCA nonspecific finding.  MRA -       PUD Prophylaxis :  PPI  Disposition Plan  :    Status is: Inpatient  Remains inpatient appropriate because:IV treatments appropriate due to intensity of illness or inability to take PO   Dispo: The patient is from: Home              Anticipated d/c is to: Home  Anticipated d/c date is: 1 day              Patient currently is medically stable to d/c.   Difficult to place patient No,  will be able to be discharged tomorrow once family help is available and home health has been arranged  DVT Prophylaxis  :  Lovenox    Lab Results  Component Value Date   PLT 300 08/16/2020    Diet :  Diet Order            Diet Carb Modified Fluid consistency: Thin; Room service appropriate? Yes  Diet effective now                  Inpatient Medications  Scheduled Meds: . aerochamber plus with mask  1 each Other Once  . aspirin  81 mg Oral Daily  . atenolol  25 mg Oral Daily  . atorvastatin  40 mg Oral QPM  . chlorhexidine  15 mL Mouth Rinse BID  . enoxaparin (LOVENOX)  injection  50 mg Subcutaneous Q24H  . insulin aspart  0-15 Units Subcutaneous TID WC  . insulin aspart  0-5 Units Subcutaneous QHS  . insulin aspart protamine- aspart  35 Units Subcutaneous BID WC  . mouth rinse  15 mL Mouth Rinse q12n4p  . methylPREDNISolone (SOLU-MEDROL) injection  40 mg Intravenous Q12H  . mometasone-formoterol  2 puff Inhalation BID  . olopatadine  1 drop Both Eyes BID  . pantoprazole  40 mg Oral QHS  . topiramate  75 mg Oral BID   Continuous Infusions:  PRN Meds:.acetaminophen, albuterol, guaiFENesin-dextromethorphan, loperamide, ondansetron (ZOFRAN) IV  Antibiotics  :    Anti-infectives (From admission, onward)   Start     Dose/Rate Route Frequency Ordered Stop   08/11/20 1000  remdesivir 100 mg in sodium chloride 0.9 % 100 mL IVPB       "Followed by" Linked Group Details   100 mg 200 mL/hr over 30 Minutes Intravenous Daily 08/10/20 1614 08/14/20 1007   08/10/20 1700  remdesivir 200 mg in sodium chloride 0.9% 250 mL IVPB       "Followed by" Linked Group Details   200 mg 580 mL/hr over 30 Minutes Intravenous Once 08/10/20 1614 08/10/20 2051   08/10/20 1600  cefTRIAXone (ROCEPHIN) 2 g in sodium chloride 0.9 % 100 mL IVPB  Status:  Discontinued        2 g 200 mL/hr over 30 Minutes Intravenous Every 24 hours 08/10/20 0513 08/10/20 0839   08/10/20 0600  azithromycin (ZITHROMAX) 250 mg in dextrose 5 % 125 mL IVPB  Status:  Discontinued        250 mg 125 mL/hr over 60 Minutes Intravenous Every 24 hours 08/10/20 0514 08/10/20 0839   08/10/20 0515  doxycycline (VIBRAMYCIN) 100 mg in sodium chloride 0.9 % 250 mL IVPB  Status:  Discontinued        100 mg 125 mL/hr over 120 Minutes Intravenous Every 12 hours 08/10/20 0513 08/10/20 0514   08/10/20 0215  cefTRIAXone (ROCEPHIN) 1 g in sodium chloride 0.9 % 100 mL IVPB        1 g 200 mL/hr over 30 Minutes Intravenous  Once 08/10/20 0202 08/10/20 0405        Imberly Troxler M.D on 08/18/2020 at 3:04 PM  To page go  to [email or web address]   [facility] -  Office  [phone]    See all Orders from today for further details    Objective:   Vitals:  08/18/20 0025 08/18/20 0355 08/18/20 0733 08/18/20 1143  BP: (!) 118/48 134/75 118/70 100/61  Pulse: 67 77  73  Resp: 16 (!) 22 20 18   Temp: 98.5 F (36.9 C) 98.1 F (36.7 C)  (!) 97.2 F (36.2 C)  TempSrc: Oral Axillary  Oral  SpO2: 94% 91%  91%  Weight:      Height:        Wt Readings from Last 3 Encounters:  08/09/20 107.5 kg  08/01/20 107.5 kg  06/13/20 107.9 kg    No intake or output data in the 24 hours ending 08/18/20 1504   Physical Exam  Awake Alert, Oriented X 3, No new F.N deficits, Normal affect Symmetrical Chest wall movement, Good air movement bilaterally, CTAB RRR,No Gallops,Rubs or new Murmurs, No Parasternal Heave +ve B.Sounds, Abd Soft, No tenderness, No rebound - guarding or rigidity. No Cyanosis, Clubbing or edema, No new Rash or bruise       Data Review:    CBC Recent Labs  Lab 08/12/20 0410 08/13/20 0108 08/14/20 0129 08/15/20 0146 08/16/20 0201  WBC 6.0 5.9 5.8 4.3 4.7  HGB 12.7 12.8 12.9 14.3 14.0  HCT 42.4 43.0 42.8 45.8 43.5  PLT 238 252 263 273 300  MCV 90.0 90.1 89.2 88.6 86.0  MCH 27.0 26.8 26.9 27.7 27.7  MCHC 30.0 29.8* 30.1 31.2 32.2  RDW 14.6 14.4 14.3 14.4 14.1  LYMPHSABS  --  1.0 1.0 0.9 0.9  MONOABS  --  0.3 0.2 0.3 0.4  EOSABS  --  0.0 0.0 0.0 0.0  BASOSABS  --  0.0 0.0 0.0 0.0    Recent Labs  Lab 08/12/20 0410 08/12/20 0839 08/13/20 0108 08/14/20 0129 08/15/20 0146 08/16/20 0133 08/16/20 0201  NA 144  --  145 144 147*  --  143  K 3.7  --  3.9 4.5 3.9  --  3.7  CL 100  --  100 100 97*  --  100  CO2 33*  --  34* 32 40*  --  34*  GLUCOSE 183*  --  104* 191* 59*  --  148*  BUN 29*  --  33* 32* 30*  --  28*  CREATININE 1.05*  --  1.15* 1.09* 1.03*  --  1.16*  CALCIUM 9.1  --  9.3 8.8* 9.2  --  9.3  AST  --   --  30 33 22  --  23  ALT  --   --  27 24 28   --  29   ALKPHOS  --   --  48 44 52  --  52  BILITOT  --   --  0.6 1.2 0.5  --  0.9  ALBUMIN  --   --  2.7* 2.8* 3.0*  --  3.1*  MG  --  2.3 2.3 2.2 2.5*  --  2.2  CRP 2.6* 2.1* 1.1* 0.7 0.6  --  <0.5  DDIMER 1.00* 1.03* 0.92* 1.20* 1.41*  --  1.16*  PROCALCITON  --  <0.10 <0.10 <0.10  --   --   --   BNP  --  28.9 419.3* 130.4* 31.1 23.6  --     ------------------------------------------------------------------------------------------------------------------ No results for input(s): CHOL, HDL, LDLCALC, TRIG, CHOLHDL, LDLDIRECT in the last 72 hours.  Lab Results  Component Value Date   HGBA1C 9.9 (A) 05/14/2020   ------------------------------------------------------------------------------------------------------------------ No results for input(s): TSH, T4TOTAL, T3FREE, THYROIDAB in the last 72 hours.  Invalid input(s): FREET3  Cardiac Enzymes No results  for input(s): CKMB, TROPONINI, MYOGLOBIN in the last 168 hours.  Invalid input(s): CK ------------------------------------------------------------------------------------------------------------------    Component Value Date/Time   BNP 23.6 08/16/2020 0133    Micro Results Recent Results (from the past 240 hour(s))  Blood Culture (routine x 2)     Status: None   Collection Time: 08/09/20 11:18 PM   Specimen: BLOOD RIGHT FOREARM  Result Value Ref Range Status   Specimen Description BLOOD RIGHT FOREARM  Final   Special Requests   Final    BOTTLES DRAWN AEROBIC AND ANAEROBIC Blood Culture adequate volume   Culture   Final    NO GROWTH 5 DAYS Performed at [facility], [address] [place], [place] [address]    Report Status 08/15/2020 FINAL  Final  Blood Culture (routine x 2)     Status: None   Collection Time: 08/09/20 11:50 PM   Specimen: BLOOD LEFT FOREARM  Result Value Ref Range Status   Specimen Description BLOOD LEFT FOREARM  Final   Special Requests   Final    BOTTLES DRAWN AEROBIC AND ANAEROBIC Blood  Culture adequate volume   Culture   Final    NO GROWTH 5 DAYS Performed at [facility], [place] [address], [place], [place] [address]    Report Status 08/15/2020 FINAL  Final  MRSA PCR Screening     Status: None   Collection Time: 08/11/20 11:56 PM   Specimen: Nasal Mucosa; Nasopharyngeal  Result Value Ref Range Status   MRSA by PCR NEGATIVE NEGATIVE Final    Comment:        The GeneXpert MRSA Assay (FDA approved for NASAL specimens only), is one component of a comprehensive MRSA colonization surveillance program. It is not intended to diagnose MRSA infection nor to guide or monitor treatment for MRSA infections. Performed at [facility], [place] [address] [place], [place] [address]     Radiology Reports DG Chest 2 View  Addendum Date: 08/01/2020   ADDENDUM REPORT: 08/01/2020 15:09 ADDENDUM: Additional history: COPD, asthma Electronically Signed   By: Lavonia Dana M.D.   On: 08/01/2020 15:09   Result Date: 08/01/2020 CLINICAL DATA:  Shortness of breath, sore throat, fever and headache for 3 days, of both grandchildren tested positive for COVID-19 yesterday EXAM: CHEST - 2 VIEW COMPARISON:  02/28/2020 FINDINGS: Enlargement of cardiac silhouette. Mediastinal contours and pulmonary vascularity normal. Minimal linear atelectasis at lingula and RIGHT base. Central peribronchial thickening without definite pulmonary infiltrate, pleural effusion, or pneumothorax. Bones demineralized. IMPRESSION: Enlargement of cardiac silhouette with minimal bibasilar atelectasis. Electronically Signed: By: Lavonia Dana M.D. On: 08/01/2020 15:06   CT Head Wo Contrast  Result Date: 08/10/2020 CLINICAL DATA:  Mental status changes.  Coronavirus infection. EXAM: CT HEAD WITHOUT CONTRAST TECHNIQUE: Contiguous axial images were obtained from the base of the skull through the vertex without intravenous contrast. COMPARISON:  01/27/2018 FINDINGS: Brain: No evidence of accelerated atrophy. No evidence of  acute infarction, intra-axial mass lesion, hemorrhage, hydrocephalus or extra-axial collection 1 cm calcified meningioma at the right frontoparietal convexity, not significant. Vascular: Question a hyperdense MCA branch on the left in the sylvian fissure region. This is not a definite finding. Skull: Normal Sinuses/Orbits: Clear Other: None IMPRESSION: 1. No acute brain parenchymal finding by CT. Question a hyperdense MCA branch on the left in the Sylvian fissure region. This is not a definite finding. If there is clinical concern about a left MCA territory acute infarction, consider CT angiography. 2. 1 cm  calcified meningioma at the right frontoparietal convexity, not significant. Electronically Signed   By: Nelson Chimes M.D.   On: 08/10/2020 02:52   CT Angio Chest PE W and/or Wo Contrast  Result Date: 08/10/2020 CLINICAL DATA:  Coronavirus pneumonia.  Question pulmonary emboli. EXAM: CT ANGIOGRAPHY CHEST WITH CONTRAST TECHNIQUE: Multidetector CT imaging of the chest was performed using the standard protocol during bolus administration of intravenous contrast. Multiplanar CT image reconstructions and MIPs were obtained to evaluate the vascular anatomy. CONTRAST:  80mL OMNIPAQUE IOHEXOL 350 MG/ML SOLN COMPARISON:  Chest radiography yesterday. FINDINGS: Cardiovascular: Cardiomegaly. No pericardial fluid. Some coronary artery calcification. No visible thoracic aortic atherosclerotic calcification. Pulmonary arterial opacification is moderate. There are no large pulmonary emboli. Small peripheral emboli might be inapparent, but none are seen. Mediastinum/Nodes: No mass or lymphadenopathy. Lungs/Pleura: No pleural effusion. Widespread patchy pulmonary infiltrates consistent with the clinical history of viral pneumonia. Fairly dense consolidation in the left lower lobe. There could possibly be superimposed bacterial pneumonia. Upper Abdomen: Left renal cyst.  Not completely evaluated. Musculoskeletal: Negative Review  of the MIP images confirms the above findings. IMPRESSION: 1. Pulmonary arterial opacification is moderate. No large pulmonary emboli. Small peripheral emboli might be inapparent, but none are seen. 2. Cardiomegaly. Some coronary artery calcification. 3. Widespread patchy pulmonary infiltrates consistent with the clinical history of viral pneumonia. Fairly dense consolidation in the left lower lobe could possibly be superimposed bacterial pneumonia. Electronically Signed   By: Nelson Chimes M.D.   On: 08/10/2020 01:48   MR ANGIO HEAD WO CONTRAST  Result Date: 08/14/2020 CLINICAL DATA:  Transient ischemic attack.  Encephalopathy. EXAM: MRA HEAD WITHOUT CONTRAST TECHNIQUE: Angiographic images of the Circle of Willis were obtained using MRA technique without intravenous contrast. COMPARISON:  None. FINDINGS: POSTERIOR CIRCULATION: --Vertebral arteries: Normal --Inferior cerebellar arteries: Normal. --Basilar artery: Normal. --Superior cerebellar arteries: Normal. --Posterior cerebral arteries: Normal. ANTERIOR CIRCULATION: --Intracranial internal carotid arteries: Normal. --Anterior cerebral arteries (ACA): Normal. --Middle cerebral arteries (MCA): Normal. ANATOMIC VARIANTS: Both posterior communicating arteries are patent IMPRESSION: Normal intracranial MRA. Electronically Signed   By: Ulyses Jarred M.D.   On: 08/14/2020 01:39   MR BRAIN WO CONTRAST  Result Date: 08/10/2020 CLINICAL DATA:  Mental status change with unknown cause. EXAM: MRI HEAD WITHOUT CONTRAST TECHNIQUE: Multiplanar, multiecho pulse sequences of the brain and surrounding structures were obtained without intravenous contrast. COMPARISON:  01/27/2018 FINDINGS: Brain: No acute infarction, hemorrhage, hydrocephalus, extra-axial collection or mass lesion. Mild for age chronic small vessel ischemic type change in the periventricular white matter and pons. Age normal brain volume Vascular: Normal flow voids Skull and upper cervical spine: Normal marrow  signal Sinuses/Orbits: Bilateral cataract resection.  No acute finding IMPRESSION: No acute finding or cause for symptoms. Electronically Signed   By: Monte Fantasia M.D.   On: 08/10/2020 10:10   DG Chest Port 1 View  Result Date: 08/13/2020 CLINICAL DATA:  Shortness of breath, cough, COVID EXAM: PORTABLE CHEST 1 VIEW COMPARISON:  08/12/2020 FINDINGS: Bilateral pulmonary opacities remain present with similar lung aeration. No significant pleural effusion. No pneumothorax. Stable cardiomediastinal contours. IMPRESSION: Similar appearance of bilateral pulmonary opacities. Electronically Signed   By: Macy Mis M.D.   On: 08/13/2020 10:27   DG Chest Port 1 View  Result Date: 08/12/2020 CLINICAL DATA:  Hypoxia.  COVID. EXAM: PORTABLE CHEST 1 VIEW COMPARISON:  08/09/2020 FINDINGS: 0520 hours. The cardio pericardial silhouette is enlarged. Low volume film with patchy bilateral airspace disease superimposed on underlying diffuse interstitial coarsening. No  substantial pleural effusion. No pneumothorax. Telemetry leads overlie the chest. IMPRESSION: No substantial interval change. Electronically Signed   By: Misty [name] M.D.   On: 08/12/2020 05:27   DG Chest Port 1 View  Result Date: 08/09/2020 CLINICAL DATA:  70 year old female with shortness of breath and cough. Positive COVID-19 EXAM: PORTABLE CHEST 1 VIEW COMPARISON:  Chest radiograph dated 08/01/2020 FINDINGS: Bilateral confluent and streaky airspace opacities consistent with multilobar pneumonia and in keeping with COVID-19. No pleural effusion pneumothorax. Stable cardiomegaly. No acute osseous pathology. IMPRESSION: Multilobar pneumonia. Electronically Signed   By: Anner Crete M.D.   On: 08/09/2020 22:57   DG Abd Portable 1V  Result Date: 08/13/2020 CLINICAL DATA:  Nausea. EXAM: PORTABLE ABDOMEN - 1 VIEW COMPARISON:  None. FINDINGS: No gaseous small moderate gaseous distention of the stomach noted bowel dilatation. Air is seen scattered along  the nondilated transverse and left colon. Multiple phleboliths are seen in the right paramidline abdomen and along the pelvic sidewalls bilaterally. Visualized bony anatomy unremarkable. IMPRESSION: No findings to suggest bowel obstruction. Electronically Signed   By: Misty [name] M.D.   On: 08/13/2020 10:36

## 2020-08-18 NOTE — Progress Notes (Deleted)
Interval history 04/30/2020: Patient with chronic intractable migraines. Still doing exceptionally well on Botox for Migraines, only botox and topiramate have ever helped. Prior tried multiple classes of medications which did not help with headaches or migraines until she started Botox. Since starting botox she has had  >95% improvement in frequency and severity, had to redo one botox and she did well again. The migraines she does have are sparse, and only recur when the botox is wearing off the last 2 weeks but still are easier to treat and >60% less severe.Nothing has worked in the past except botox and Topiramate.  No masseters or any other additional injections  Consent Form Botulism Toxin Injection For Chronic Migraine    Reviewed orally with patient, additionally signature is on file:  Botulism toxin has been approved by the Federal drug administration for treatment of chronic migraine. Botulism toxin does not cure chronic migraine and it may not be effective in some patients.  The administration of botulism toxin is accomplished by injecting a small amount of toxin into the muscles of the neck and head. Dosage must be titrated for each individual. Any benefits resulting from botulism toxin tend to wear off after 3 months with a repeat injection required if benefit is to be maintained. Injections are usually done every 3-4 months with maximum effect peak achieved by about 2 or 3 weeks. Botulism toxin is expensive and you should be sure of what costs you will incur resulting from the injection.  The side effects of botulism toxin use for chronic migraine may include:   -Transient, and usually mild, facial weakness with facial injections  -Transient, and usually mild, head or neck weakness with head/neck injections  -Reduction or loss of forehead facial animation due to forehead muscle weakness  -Eyelid drooping  -Dry eye  -Pain at the site of injection or bruising at the site of  injection  -Double vision  -Potential unknown long term risks  Contraindications: You should not have Botox if you are pregnant, nursing, allergic to albumin, have an infection, skin condition, or muscle weakness at the site of the injection, or have myasthenia gravis, Lambert-Eaton syndrome, or ALS.  It is also possible that as with any injection, there may be an allergic reaction or no effect from the medication. Reduced effectiveness after repeated injections is sometimes seen and rarely infection at the injection site may occur. All care will be taken to prevent these side effects. If therapy is given over a long time, atrophy and wasting in the muscle injected may occur. Occasionally the patient's become refractory to treatment because they develop antibodies to the toxin. In this event, therapy needs to be modified.  I have read the above information and consent to the administration of botulism toxin.    BOTOX PROCEDURE NOTE FOR MIGRAINE HEADACHE    Contraindications and precautions discussed with patient(above). Aseptic procedure was observed and patient tolerated procedure. Procedure performed by Dr. Georgia Dom  The condition has existed for more than 6 months, and pt does not have a diagnosis of ALS, Myasthenia Gravis or Lambert-Eaton Syndrome.  Risks and benefits of injections discussed and pt agrees to proceed with the procedure.  Written consent obtained  These injections are medically necessary. Pt  receives good benefits from these injections. These injections do not cause sedations or hallucinations which the oral therapies may cause.  Description of procedure:  The patient was placed in a sitting position. The standard protocol was used for Botox as follows, with  5 units of Botox injected at each site:   -Procerus muscle, midline injection  -Corrugator muscle, bilateral injection  -Frontalis muscle, bilateral injection, with 2 sites each side, medial injection was  performed in the upper one third of the frontalis muscle, in the region vertical from the medial inferior edge of the superior orbital rim. The lateral injection was again in the upper one third of the forehead vertically above the lateral limbus of the cornea, 1.5 cm lateral to the medial injection site.  -Temporalis muscle injection, 4 sites, bilaterally. The first injection was 3 cm above the tragus of the ear, second injection site was 1.5 cm to 3 cm up from the first injection site in line with the tragus of the ear. The third injection site was 1.5-3 cm forward between the first 2 injection sites. The fourth injection site was 1.5 cm posterior to the second injection site.   -Occipitalis muscle injection, 3 sites, bilaterally. The first injection was done one half way between the occipital protuberance and the tip of the mastoid process behind the ear. The second injection site was done lateral and superior to the first, 1 fingerbreadth from the first injection. The third injection site was 1 fingerbreadth superiorly and medially from the first injection site.  -Cervical paraspinal muscle injection, 2 sites, bilateral knee first injection site was 1 cm from the midline of the cervical spine, 3 cm inferior to the lower border of the occipital protuberance. The second injection site was 1.5 cm superiorly and laterally to the first injection site.  -Trapezius muscle injection was performed at 3 sites, bilaterally. The first injection site was in the upper trapezius muscle halfway between the inflection point of the neck, and the acromion. The second injection site was one half way between the acromion and the first injection site. The third injection was done between the first injection site and the inflection point of the neck.   Will return for repeat injection in 3 months.   200 units of Botox was used, any Botox not injected was wasted. The patient tolerated the procedure well, there were no  complications of the above procedure.

## 2020-08-18 NOTE — TOC Initial Note (Addendum)
Transition of Care [facility]) - Initial/Assessment Note    Patient Details  Name: Heidi [name] MRN: [id number] Date of Birth: 07-02-1951  Transition of Care [facility]) CM/SW Contact:    Carles Collet, RN Phone Number: 08/18/2020, 10:20 AM  Clinical Narrative:                 Damaris Schooner w patient over the phone. Discussed PT recs for SNF, confirmed that patient declines SNF. SHe is agreeable to [facility] services. She does not have provider preference. Referral pending through [facility]. Identified with patient needs for [facility] PT OT HHA SW at time of DC.  Patient has oxygen through [place]. She states that her daughter will be able to assist with transportation to appointments.   [place] accepted   Needs: Orders for [facility] PT OT HHA SW   Expected Discharge Plan: [place] Barriers to Discharge: Continued Medical Work up   Patient Goals and CMS Choice Patient states their goals for this hospitalization and ongoing recovery are:: to go home CMS Medicare.gov Compare Post Acute Care list provided to:: Patient Choice offered to / list presented to : Patient  Expected Discharge Plan and Services Expected Discharge Plan: [place]   Discharge Planning Services: CM Consult Post Acute Care Choice: [place] arrangements for the past 2 months: Apartment                           HH Arranged: PT,OT,Nurse's Aide,Social Work [employer] Agency: [facility] [employer] [facility] [employer] Date [place]: 08/18/20 Time [place]: 22 Representative spoke with at [place]: [place]  Prior Living Arrangements/Services Living arrangements for the past 2 months: [place] with:: Self                   Activities of Daily Living Home Assistive Devices/Equipment: Cane (specify quad or straight) ADL Screening (condition at time of admission) Patient's cognitive ability adequate to safely complete daily activities?: No Is the patient deaf or have  difficulty hearing?: No Does the patient have difficulty seeing, even when wearing glasses/contacts?: Yes Does the patient have difficulty concentrating, remembering, or making decisions?: Yes Patient able to express need for assistance with ADLs?: Yes Does the patient have difficulty dressing or bathing?: Yes Independently performs ADLs?: No Communication: Independent Dressing (OT): Needs assistance Is this a change from baseline?: Change from baseline, expected to last <3days Grooming: Needs assistance Is this a change from baseline?: Change from baseline, expected to last <3 days Feeding: Needs assistance (needs set up) Is this a change from baseline?: Change from baseline, expected to last <3 days Bathing: Dependent Is this a change from baseline?: Change from baseline, expected to last <3 days Toileting: Dependent Is this a change from baseline?: Change from baseline, expected to last <3 days In/Out Bed: Dependent Is this a change from baseline?: Change from baseline, expected to last <3 days Walks in Home: Independent with device (comment) Does the patient have difficulty walking or climbing stairs?: Yes Weakness of Legs: Both Weakness of Arms/Hands: Both  Permission Sought/Granted                  Emotional Assessment              Admission diagnosis:  Encephalopathy [G93.40] AKI (acute kidney injury) ([place]) [N17.9] Acute respiratory failure with hypoxia and hypercarbia ([place]) [J96.01, J96.02] Hyperglycemia due to diabetes mellitus ([place]) [E11.65] COVID [  U07.1] Pneumonia due to COVID-19 virus [U07.1, J12.82] COVID-19 [U07.1] Patient Active Problem List   Diagnosis Date Noted  . Pneumonia due to COVID-19 virus 08/10/2020  . Encephalopathy due to COVID-19 virus 08/10/2020  . CAP (community acquired pneumonia) 08/10/2020  . Acute respiratory failure with hypoxia and hypercapnia ([place]) 08/10/2020  . Acute kidney injury superimposed on CKD ([place]) 08/10/2020  . COVID  08/10/2020  . Encounter for medication review and counseling 03/18/2020  . Medication management 02/28/2020  . Exertional dyspnea 01/03/2020  . Multifocal pneumonia 11/24/2019  . Acute respiratory failure ([place]) 11/24/2019  . Breast nodule 11/24/2019  . Chronic respiratory failure with hypoxia, on home oxygen therapy ([place]) 11/13/2019  . Atypical chest pain 08/24/2019  . Cervical myelopathy ([place]) 08/16/2018  . Diabetic retinopathy of right eye associated with type 2 diabetes mellitus ([place]) 08/16/2018  . Upper airway cough syndrome 05/03/2018  . Morbid obesity due to excess calories ([place]) complicated by hbp/ dm / hyperlipidemia 09/10/2017  . Cough variant asthma vs UACS/vcd on ACEi  09/09/2017  . Anxiety and depression 09/06/2017  . Mild persistent asthma without complication 32/06/2481  . Paranoia ([place]) 09/06/2017  . Torn left ear lobe 11/20/2016  . Chronic migraine w/o aura w/o status migrainosus, not intractable 06/27/2016  . Post-menopausal atrophic vaginitis 11/14/2015  . Primary osteoarthritis of both knees 07/18/2015  . GERD (gastroesophageal reflux disease) 05/08/2015  . Renal cyst 05/08/2015  . Adenomatous polyp of colon 03/26/2015  . Essential hypertension 02/12/2015  . Cognitive deficit due to old head trauma 02/12/2015  . Asthma, chronic 02/12/2015  . Chronic left shoulder pain 02/11/2015  . Thyroid nodule 12/18/2014  . Chronic neck pain   . HLD (hyperlipidemia)   . OSA treated with BiPAP 11/27/2014  . Obesity hypoventilation syndrome ([place]) 11/27/2014  . Demand ischemia ([place])   . Uncontrolled type 2 diabetes mellitus with hyperglycemia ([place])   . Cardiomegaly 11/25/2014  . CHF (congestive heart failure) ([place]) 11/25/2014  . DM type 2 causing CKD stage 2 ([place]) 11/25/2014   PCP:  Ladell Pier, MD Pharmacy:   [facility] [address], [address] [place] [address] Phone:  [phone] Fax: [phone]     Social Determinants of Health (SDOH) Interventions    Readmission Risk Interventions No flowsheet data found.

## 2020-08-19 ENCOUNTER — Ambulatory Visit: Payer: Self-pay | Admitting: Neurology

## 2020-08-19 ENCOUNTER — Telehealth: Payer: Self-pay | Admitting: Internal Medicine

## 2020-08-19 DIAGNOSIS — R279 Unspecified lack of coordination: Secondary | ICD-10-CM | POA: Diagnosis not present

## 2020-08-19 DIAGNOSIS — I130 Hypertensive heart and chronic kidney disease with heart failure and stage 1 through stage 4 chronic kidney disease, or unspecified chronic kidney disease: Secondary | ICD-10-CM | POA: Diagnosis not present

## 2020-08-19 DIAGNOSIS — U071 COVID-19: Secondary | ICD-10-CM | POA: Diagnosis not present

## 2020-08-19 DIAGNOSIS — J9621 Acute and chronic respiratory failure with hypoxia: Secondary | ICD-10-CM | POA: Diagnosis not present

## 2020-08-19 DIAGNOSIS — Z9981 Dependence on supplemental oxygen: Secondary | ICD-10-CM

## 2020-08-19 DIAGNOSIS — Z6841 Body Mass Index (BMI) 40.0 and over, adult: Secondary | ICD-10-CM | POA: Diagnosis not present

## 2020-08-19 DIAGNOSIS — I5032 Chronic diastolic (congestive) heart failure: Secondary | ICD-10-CM | POA: Diagnosis not present

## 2020-08-19 DIAGNOSIS — Z743 Need for continuous supervision: Secondary | ICD-10-CM | POA: Diagnosis not present

## 2020-08-19 DIAGNOSIS — G9341 Metabolic encephalopathy: Secondary | ICD-10-CM | POA: Diagnosis not present

## 2020-08-19 DIAGNOSIS — I5033 Acute on chronic diastolic (congestive) heart failure: Secondary | ICD-10-CM | POA: Diagnosis not present

## 2020-08-19 DIAGNOSIS — J9611 Chronic respiratory failure with hypoxia: Secondary | ICD-10-CM

## 2020-08-19 DIAGNOSIS — G9349 Other encephalopathy: Secondary | ICD-10-CM | POA: Diagnosis not present

## 2020-08-19 DIAGNOSIS — R0902 Hypoxemia: Secondary | ICD-10-CM | POA: Diagnosis not present

## 2020-08-19 DIAGNOSIS — J9622 Acute and chronic respiratory failure with hypercapnia: Secondary | ICD-10-CM | POA: Diagnosis not present

## 2020-08-19 DIAGNOSIS — J440 Chronic obstructive pulmonary disease with acute lower respiratory infection: Secondary | ICD-10-CM | POA: Diagnosis not present

## 2020-08-19 DIAGNOSIS — R0602 Shortness of breath: Secondary | ICD-10-CM | POA: Diagnosis not present

## 2020-08-19 DIAGNOSIS — E11319 Type 2 diabetes mellitus with unspecified diabetic retinopathy without macular edema: Secondary | ICD-10-CM | POA: Diagnosis not present

## 2020-08-19 DIAGNOSIS — N179 Acute kidney failure, unspecified: Secondary | ICD-10-CM | POA: Diagnosis not present

## 2020-08-19 DIAGNOSIS — Z794 Long term (current) use of insulin: Secondary | ICD-10-CM

## 2020-08-19 DIAGNOSIS — J1282 Pneumonia due to coronavirus disease 2019: Secondary | ICD-10-CM | POA: Diagnosis not present

## 2020-08-19 LAB — GLUCOSE, CAPILLARY: Glucose-Capillary: 391 mg/dL — ABNORMAL HIGH (ref 70–99)

## 2020-08-19 MED ORDER — FUROSEMIDE 40 MG PO TABS: 20.0000 mg | ORAL_TABLET | Freq: Every day | ORAL | 3 refills | Status: DC

## 2020-08-19 MED ORDER — MELATONIN 3 MG PO TABS
3.0000 mg | ORAL_TABLET | Freq: Every evening | ORAL | Status: DC | PRN
  Administered 2020-08-19: 3 mg via ORAL
  Filled 2020-08-19: qty 1

## 2020-08-19 MED ORDER — INSULIN ASPART PROT & ASPART (70-30 MIX) 100 UNIT/ML ~~LOC~~ SUSP: 40.0000 [IU] | Freq: Two times a day (BID) | SUBCUTANEOUS | 11 refills | Status: DC

## 2020-08-19 MED ORDER — FUROSEMIDE 40 MG PO TABS: 40.0000 mg | ORAL_TABLET | Freq: Every day | ORAL | 3 refills | Status: DC

## 2020-08-19 MED ORDER — METHYLPREDNISOLONE SODIUM SUCC 40 MG IJ SOLR
40.0000 mg | Freq: Every day | INTRAMUSCULAR | Status: DC
  Administered 2020-08-19: 40 mg via INTRAVENOUS
  Filled 2020-08-19: qty 1

## 2020-08-19 NOTE — Discharge Summary (Signed)
Heidi [name], is a 70 y.o. female  DOB 09/05/1950  MRN [id number].  Admission date:  08/09/2020  Admitting Physician  Candee Furbish, MD  Discharge Date:  08/19/2020   Primary MD  Ladell Pier, MD  Recommendations for primary care physician for things to follow:  -Please check CBC, CMP during next visit -Adjust hypertensive medicine as needed, hydrochlorothiazide has been stopped. -Adjust diabetes medicine as needed, her insulin dose has been lowered.  Admission Diagnosis  Encephalopathy [G93.40] AKI (acute kidney injury) ([place]) [N17.9] Acute respiratory failure with hypoxia and hypercarbia (H[facility]) [J96.01, J96.02] Hyperglycemia due to diabetes mellitus ([place]) [E11.65] COVID [U07.1] Pneumonia due to COVID-19 virus [U07.1, J12.82] COVID-19 [U07.1]   Discharge Diagnosis  Encephalopathy [G93.40] AKI (acute kidney injury) ([place]) [N17.9] Acute respiratory failure with hypoxia and hypercarbia ([place]) [J96.01, J96.02] Hyperglycemia due to diabetes mellitus ([place]) [E11.65] COVID [U07.1] Pneumonia due to COVID-19 virus [U07.1, J12.82] COVID-19 [U07.1]    Principal Problem:   Encephalopathy due to COVID-19 virus Active Problems:   Uncontrolled type 2 diabetes mellitus with hyperglycemia ([place])   Essential hypertension   Pneumonia due to COVID-19 virus   CAP (community acquired pneumonia)   Acute respiratory failure with hypoxia and hypercapnia ([place])   Acute kidney injury superimposed on CKD ([place])   COVID      Past Medical History:  Diagnosis Date  . Adenomatous colon polyp   . Anemia   . Anxiety   . Asthma   . CHF (congestive heart failure) ([place])   . COPD (chronic obstructive pulmonary disease) ([place])   . Diabetes mellitus without complication ([place])   . High cholesterol   . Hypertension   . Left knee injury   . Pneumonia     Past Surgical History:  Procedure Laterality Date  .  ABDOMINAL HYSTERECTOMY    . CATARACT EXTRACTION Left   . CATARACT EXTRACTION     LT 01/2017, 02/2017 on RT  . CESAREAN SECTION         History of present illness and  Hospital Course:     Kindly see H&P for history of present illness and admission details, please review complete Labs, Consult reports and Test reports for all details in brief  HPI  from the history and physical done on the day of admission 08/10/2020   HPI: Heidi [name] is a 70 y.o. female with medical history significant for chronic respiratory failure on 2 L home O2, COPD, chronic diastolic congestive heart failure, CKD stage III, OSA/obesity hypoventilation syndrome not on CPAP who presents by EMS for altered mental status. The patient tested positive for COVID-19 on January 27. She is unable to provide any history. She is lethargic but opens eyes to tactile stimulation but does not answer questions. No family at bedside. She has had hypoxia in the Er and is on oxygen. ABG shows hypercapnia and will be reassessed shortly with repeat ABG. If hypercapnia does not improve will need Bipap therapy.  Pt had Covid pneumonia on chest CTA as well  as a superimposed LLL infiltrate consistent with bacterial infection.  Pt was started on antibiotic therapy in ER.   Hospital Course    Acute Hypoxic Resp. Failure due to Acute Covid 19 Viral Pneumonitis + Acute on Chronic Diastolic CHF  - she unfortunately not vaccinated for Covid  - she seems to have incurred moderate to severe parenchymal lung injury.    With significant oxygen requirement initially, this has improved with treatment, she is back to her baseline at 2 L nasal cannula.   He has been treated with IV steroids during hospital stay, finished total of 10 days, no need for further steroids as an outpatient, she was treated with Remdesivir as well, and she did receive Actemra on admission 08/10/2020 .  Acute on chronic diastolic CHF EF 71% on recent echocardiogram. -Did  receive IV diuresis as needed, currently on discharge volume status appears to be appropriate, she is to resume her home dose Lasix on discharge.3.  Breast nodule on CT angiogram of May 2021 -Patient had mammogram done 05/01/2020, with evidence of multiple benign bilateral breast nodules, no evidence of malignancy, with recommendation to repeat mammogram in 1 year, have discussed this with the patient, she is aware of these findings and the need to repeat mammogram.  CT head showing possible MCA nonspecific finding.  MRI brain/MRA head with no acute findings.  Thyroid nodule.  TSH normal, has had biopsy done in the past and deemed to be benign.  Follow with PCP.  Dyslipidemia.  On Lipitor.  AKI on CKD 2. AKI much improved, creatinine peaked at 1.4, it is 1.1 on discharge.  Morbid obesity with underlying OSA.  BMI of 40. - follow with PCP for weight loss, she would benefit from Outpatient sleep study pulmonary follow-up.  DM type II. Resume home medications on discharge  Hypernatremia -Resolved.  Discharge Condition:  Stable   Follow UP   Follow-up Information    [facility] Follow up.   Specialty: Home Health Services Why: For home health services. They will be in contact in 1-2 days to set up home health appointment Contact information: [place] [address] [phone]        Ladell Pier, MD Follow up in 10 day(s).   Specialty: Internal Medicine Contact information: [place] [address] [phone]                 Discharge Instructions  and  Discharge Medications    Discharge Instructions    Discharge instructions   Complete by: As directed    Follow with Primary MD Ladell Pier, MD in 10 days   Get CBC, CMP, 2 view Chest X ray checked  by Primary MD next visit.    Activity: As tolerated with Full fall precautions use walker/cane & assistance as needed   Disposition Home    Diet:  Heart Healthy /carb modiefied , with feeding assistance and aspiration precautions.  For Heart failure patients - Check your Weight same time everyday, if you gain over 2 pounds, or you develop in leg swelling, experience more shortness of breath or chest pain, call your Primary MD immediately. Follow Cardiac Low Salt Diet and 1.5 lit/day fluid restriction.   On your next visit with your primary care physician please Get Medicines reviewed and adjusted.   Please request your Prim.MD to go over all Hospital Tests and Procedure/Radiological results at the follow up, please get all Hospital records sent to  your Prim MD by signing hospital release before you go home.   If you experience worsening of your admission symptoms, develop shortness of breath, life threatening emergency, suicidal or homicidal thoughts you must seek medical attention immediately by calling 911 or calling your MD immediately  if symptoms less severe.  You Must read complete instructions/literature along with all the possible adverse reactions/side effects for all the Medicines you take and that have been prescribed to you. Take any new Medicines after you have completely understood and accpet all the possible adverse reactions/side effects.   Do not drive, operating heavy machinery, perform activities at heights, swimming or participation in water activities or provide baby sitting services if your were admitted for syncope or siezures until you have seen by Primary MD or a Neurologist and advised to do so again.  Do not drive when taking Pain medications.    Do not take more than prescribed Pain, Sleep and Anxiety Medications  Special Instructions: If you have smoked or chewed Tobacco  in the last 2 yrs please stop smoking, stop any regular Alcohol  and or any Recreational drug use.  Wear Seat belts while driving.   Please note  You were cared for by a hospitalist during your hospital stay. If you have any questions  about your discharge medications or the care you received while you were in the hospital after you are discharged, you can call the unit and asked to speak with the hospitalist on call if the hospitalist that took care of you is not available. Once you are discharged, your primary care physician will handle any further medical issues. Please note that NO REFILLS for any discharge medications will be authorized once you are discharged, as it is imperative that you return to your primary care physician (or establish a relationship with a primary care physician if you do not have one) for your aftercare needs so that they can reassess your need for medications and monitor your lab values.   Increase activity slowly   Complete by: As directed      Allergies as of 08/19/2020      Reactions   Januvia [sitagliptin]    Chest pains      Medication List    STOP taking these medications   azithromycin 250 MG tablet Commonly known as: Zithromax Z-Pak   guaiFENesin 600 MG 12 hr tablet Commonly known as: Mucinex   hydrochlorothiazide 25 MG tablet Commonly known as: HYDRODIURIL     TAKE these medications   Accu-Chek FastClix Lancets Misc Use as directed to test blood sugar three times daily DX E11.65   Accu-Chek Guide Me w/Device Kit Use as directed to test blood sugar three times daily DX E11.65   Accu-Chek Guide test strip Generic drug: glucose blood Use as directed to test blood sugar three times daily DX E11.65   Advair HFA 115-21 MCG/ACT inhaler Generic drug: fluticasone-salmeterol Inhale 2 puffs into the lungs 2 (two) times daily.   albuterol (2.5 MG/3ML) 0.083% nebulizer solution Commonly known as: PROVENTIL USE 1 VIAL VIA NEBULIZER EVERY 6 HOURS AS NEEDED FOR WHEEZING OR SHORTNESS OF BREATH What changed:   how much to take  how to take this  when to take this  reasons to take this  additional instructions   albuterol 108 (90 Base) MCG/ACT inhaler Commonly known as:  VENTOLIN HFA Inhale 1-2 puffs into the lungs every 4 (four) hours as needed for wheezing or shortness of breath. What changed: Another medication with  the same name was changed. Make sure you understand how and when to take each.   aspirin 81 MG chewable tablet Chew 81 mg by mouth daily.   atenolol 25 MG tablet Commonly known as: TENORMIN Take 1 tablet (25 mg total) by mouth daily.   atorvastatin 40 MG tablet Commonly known as: LIPITOR TAKE 1 TABLET BY MOUTH DAILY AT 6 PM What changed: See the new instructions.   benzonatate 100 MG capsule Commonly known as: TESSALON Take 1 capsule (100 mg total) by mouth every 8 (eight) hours.   calcium-vitamin D 500-200 MG-UNIT Tabs tablet Commonly known as: OSCAL WITH D Calcium 500 + D 500 mg-5 mcg (200 unit) tablet  TK 1 T PO D   dapagliflozin propanediol 5 MG Tabs tablet Commonly known as: FARXIGA Take 5 mg by mouth daily.   famotidine 20 MG tablet Commonly known as: PEPCID Take 1 tablet (20 mg total) by mouth 2 (two) times daily.   furosemide 40 MG tablet Commonly known as: LASIX Take 1 tablet (40 mg total) by mouth daily.   insulin aspart protamine- aspart (70-30) 100 UNIT/ML injection Commonly known as: NOVOLOG MIX 70/30 Inject 0.4 mLs (40 Units total) into the skin 2 (two) times daily with a meal. Inject 70 units in the morning and 45 units in the evening. What changed:   how much to take  when to take this   INSULIN SYRINGE 1CC/29G 29G X 1/2" 1 ML Misc USE THREE TIMES DAILY AS DIRECTED   loratadine 10 MG tablet Commonly known as: CLARITIN Take 10 mg by mouth daily as needed for allergies.   losartan 50 MG tablet Commonly known as: COZAAR Take 1 tablet (50 mg total) by mouth daily.   montelukast 10 MG tablet Commonly known as: SINGULAIR Take 1 tablet (10 mg total) by mouth at bedtime.   multivitamin with minerals Tabs tablet Take 1 tablet by mouth daily.   nitroGLYCERIN 0.3 MG SL tablet Commonly known as:  NITROSTAT Place 1 tablet (0.3 mg total) under the tongue every 5 (five) minutes as needed for chest pain. Max 3 tablets in 15 minutes   olopatadine 0.1 % ophthalmic solution Commonly known as: PATANOL Place 1 drop into both eyes 2 (two) times daily. What changed:   when to take this  reasons to take this   pantoprazole 40 MG tablet Commonly known as: PROTONIX TAKE 1 TABLET(40 MG) BY MOUTH DAILY What changed: See the new instructions.   Potassium Chloride ER 20 MEQ Tbcr TAKE 1 TABLET BY MOUTH DAILY What changed: how much to take   tetrahydrozoline-zinc 0.05-0.25 % ophthalmic solution Commonly known as: VISINE-AC Place 1 drop into both eyes 3 (three) times daily as needed.   topiramate 25 MG capsule Commonly known as: TOPAMAX TAKE 3 CAPSULES(75 MG) BY MOUTH TWICE DAILY What changed: See the new instructions.   traMADol 50 MG tablet Commonly known as: ULTRAM Take 1 tablet (50 mg total) by mouth every 8 (eight) hours as needed (Prescription must last 1 month).         Diet and Activity recommendation: See Discharge Instructions above    Major procedures and Radiology Reports - PLEASE review detailed and final reports for all details, in brief -      DG Chest 2 View  Addendum Date: 08/01/2020   ADDENDUM REPORT: 08/01/2020 15:09 ADDENDUM: Additional history: COPD, asthma Electronically Signed   By: Lavonia Dana M.D.   On: 08/01/2020 15:09   Result Date: 08/01/2020 CLINICAL DATA:  Shortness of breath, sore throat, fever and headache for 3 days, of both grandchildren tested positive for COVID-19 yesterday EXAM: CHEST - 2 VIEW COMPARISON:  02/28/2020 FINDINGS: Enlargement of cardiac silhouette. Mediastinal contours and pulmonary vascularity normal. Minimal linear atelectasis at lingula and RIGHT base. Central peribronchial thickening without definite pulmonary infiltrate, pleural effusion, or pneumothorax. Bones demineralized. IMPRESSION: Enlargement of cardiac silhouette  with minimal bibasilar atelectasis. Electronically Signed: By: Lavonia Dana M.D. On: 08/01/2020 15:06   CT Head Wo Contrast  Result Date: 08/10/2020 CLINICAL DATA:  Mental status changes.  Coronavirus infection. EXAM: CT HEAD WITHOUT CONTRAST TECHNIQUE: Contiguous axial images were obtained from the base of the skull through the vertex without intravenous contrast. COMPARISON:  01/27/2018 FINDINGS: Brain: No evidence of accelerated atrophy. No evidence of acute infarction, intra-axial mass lesion, hemorrhage, hydrocephalus or extra-axial collection 1 cm calcified meningioma at the right frontoparietal convexity, not significant. Vascular: Question a hyperdense MCA branch on the left in the sylvian fissure region. This is not a definite finding. Skull: Normal Sinuses/Orbits: Clear Other: None IMPRESSION: 1. No acute brain parenchymal finding by CT. Question a hyperdense MCA branch on the left in the Sylvian fissure region. This is not a definite finding. If there is clinical concern about a left MCA territory acute infarction, consider CT angiography. 2. 1 cm calcified meningioma at the right frontoparietal convexity, not significant. Electronically Signed   By: Nelson Chimes M.D.   On: 08/10/2020 02:52   CT Angio Chest PE W and/or Wo Contrast  Result Date: 08/10/2020 CLINICAL DATA:  Coronavirus pneumonia.  Question pulmonary emboli. EXAM: CT ANGIOGRAPHY CHEST WITH CONTRAST TECHNIQUE: Multidetector CT imaging of the chest was performed using the standard protocol during bolus administration of intravenous contrast. Multiplanar CT image reconstructions and MIPs were obtained to evaluate the vascular anatomy. CONTRAST:  17m OMNIPAQUE IOHEXOL 350 MG/ML SOLN COMPARISON:  Chest radiography yesterday. FINDINGS: Cardiovascular: Cardiomegaly. No pericardial fluid. Some coronary artery calcification. No visible thoracic aortic atherosclerotic calcification. Pulmonary arterial opacification is moderate. There are no large  pulmonary emboli. Small peripheral emboli might be inapparent, but none are seen. Mediastinum/Nodes: No mass or lymphadenopathy. Lungs/Pleura: No pleural effusion. Widespread patchy pulmonary infiltrates consistent with the clinical history of viral pneumonia. Fairly dense consolidation in the left lower lobe. There could possibly be superimposed bacterial pneumonia. Upper Abdomen: Left renal cyst.  Not completely evaluated. Musculoskeletal: Negative Review of the MIP images confirms the above findings. IMPRESSION: 1. Pulmonary arterial opacification is moderate. No large pulmonary emboli. Small peripheral emboli might be inapparent, but none are seen. 2. Cardiomegaly. Some coronary artery calcification. 3. Widespread patchy pulmonary infiltrates consistent with the clinical history of viral pneumonia. Fairly dense consolidation in the left lower lobe could possibly be superimposed bacterial pneumonia. Electronically Signed   By: MNelson ChimesM.D.   On: 08/10/2020 01:48   MR ANGIO HEAD WO CONTRAST  Result Date: 08/14/2020 CLINICAL DATA:  Transient ischemic attack.  Encephalopathy. EXAM: MRA HEAD WITHOUT CONTRAST TECHNIQUE: Angiographic images of the Circle of Willis were obtained using MRA technique without intravenous contrast. COMPARISON:  None. FINDINGS: POSTERIOR CIRCULATION: --Vertebral arteries: Normal --Inferior cerebellar arteries: Normal. --Basilar artery: Normal. --Superior cerebellar arteries: Normal. --Posterior cerebral arteries: Normal. ANTERIOR CIRCULATION: --Intracranial internal carotid arteries: Normal. --Anterior cerebral arteries (ACA): Normal. --Middle cerebral arteries (MCA): Normal. ANATOMIC VARIANTS: Both posterior communicating arteries are patent IMPRESSION: Normal intracranial MRA. Electronically Signed   By: KUlyses JarredM.D.   On: 08/14/2020 01:39   MR BRAIN WO CONTRAST  Result Date: 08/10/2020 CLINICAL DATA:  Mental status change with unknown cause. EXAM: MRI HEAD WITHOUT  CONTRAST TECHNIQUE: Multiplanar, multiecho pulse sequences of the brain and surrounding structures were obtained without intravenous contrast. COMPARISON:  01/27/2018 FINDINGS: Brain: No acute infarction, hemorrhage, hydrocephalus, extra-axial collection or mass lesion. Mild for age chronic small vessel ischemic type change in the periventricular white matter and pons. Age normal brain volume Vascular: Normal flow voids Skull and upper cervical spine: Normal marrow signal Sinuses/Orbits: Bilateral cataract resection.  No acute finding IMPRESSION: No acute finding or cause for symptoms. Electronically Signed   By: Monte Fantasia M.D.   On: 08/10/2020 10:10   DG Chest Port 1 View  Result Date: 08/13/2020 CLINICAL DATA:  Shortness of breath, cough, COVID EXAM: PORTABLE CHEST 1 VIEW COMPARISON:  08/12/2020 FINDINGS: Bilateral pulmonary opacities remain present with similar lung aeration. No significant pleural effusion. No pneumothorax. Stable cardiomediastinal contours. IMPRESSION: Similar appearance of bilateral pulmonary opacities. Electronically Signed   By: Macy Mis M.D.   On: 08/13/2020 10:27   DG Chest Port 1 View  Result Date: 08/12/2020 CLINICAL DATA:  Hypoxia.  COVID. EXAM: PORTABLE CHEST 1 VIEW COMPARISON:  08/09/2020 FINDINGS: 0520 hours. The cardio pericardial silhouette is enlarged. Low volume film with patchy bilateral airspace disease superimposed on underlying diffuse interstitial coarsening. No substantial pleural effusion. No pneumothorax. Telemetry leads overlie the chest. IMPRESSION: No substantial interval change. Electronically Signed   By: Misty [name] M.D.   On: 08/12/2020 05:27   DG Chest Port 1 View  Result Date: 08/09/2020 CLINICAL DATA:  70 year old female with shortness of breath and cough. Positive COVID-19 EXAM: PORTABLE CHEST 1 VIEW COMPARISON:  Chest radiograph dated 08/01/2020 FINDINGS: Bilateral confluent and streaky airspace opacities consistent with multilobar  pneumonia and in keeping with COVID-19. No pleural effusion pneumothorax. Stable cardiomegaly. No acute osseous pathology. IMPRESSION: Multilobar pneumonia. Electronically Signed   By: Anner Crete M.D.   On: 08/09/2020 22:57   DG Abd Portable 1V  Result Date: 08/13/2020 CLINICAL DATA:  Nausea. EXAM: PORTABLE ABDOMEN - 1 VIEW COMPARISON:  None. FINDINGS: No gaseous small moderate gaseous distention of the stomach noted bowel dilatation. Air is seen scattered along the nondilated transverse and left colon. Multiple phleboliths are seen in the right paramidline abdomen and along the pelvic sidewalls bilaterally. Visualized bony anatomy unremarkable. IMPRESSION: No findings to suggest bowel obstruction. Electronically Signed   By: Misty [name] M.D.   On: 08/13/2020 10:36    Micro Results    Recent Results (from the past 240 hour(s))  Blood Culture (routine x 2)     Status: None   Collection Time: 08/09/20 11:18 PM   Specimen: BLOOD RIGHT FOREARM  Result Value Ref Range Status   Specimen Description BLOOD RIGHT FOREARM  Final   Special Requests   Final    BOTTLES DRAWN AEROBIC AND ANAEROBIC Blood Culture adequate volume   Culture   Final    NO GROWTH 5 DAYS Performed at [facility], [address], [place], [place] [address]    Report Status 08/15/2020 FINAL  Final  Blood Culture (routine x 2)     Status: None   Collection Time: 08/09/20 11:50 PM   Specimen: BLOOD LEFT FOREARM  Result Value Ref Range Status   Specimen Description BLOOD LEFT FOREARM  Final   Special Requests   Final    BOTTLES DRAWN AEROBIC AND ANAEROBIC Blood Culture adequate volume   Culture   Final    NO  GROWTH 5 DAYS Performed at [facility], [place] [address], [place], [place] [address]    Report Status 08/15/2020 FINAL  Final  MRSA PCR Screening     Status: None   Collection Time: 08/11/20 11:56 PM   Specimen: Nasal Mucosa; Nasopharyngeal  Result Value Ref Range Status   MRSA by PCR NEGATIVE  NEGATIVE Final    Comment:        The GeneXpert MRSA Assay (FDA approved for NASAL specimens only), is one component of a comprehensive MRSA colonization surveillance program. It is not intended to diagnose MRSA infection nor to guide or monitor treatment for MRSA infections. Performed at [facility], [place] [address], [place], [place] [address]        Today   Subjective:   Domingue Coltrain today has no headache,no new weakness tingling or numbness, feels much better wants to go home today.  An episode of bloating and epigastric pain earlier today when getting out of bed to chair, no chest pain, EKG nonacute, currently resolved.  Objective:   Blood pressure 135/75, pulse 81, temperature (!) 97.3 F (36.3 C), temperature source Oral, resp. rate 18, height _0  (1.626 m), weight 107.5 kg, SpO2 95 %.  No intake or output data in the 24 hours ending 08/19/20 0909  Exam Awake Alert, Oriented x 3, No new F.N deficits, Normal affect Symmetrical Chest wall movement, Good air movement bilaterally, CTAB RRR,No Gallops,Rubs or new Murmurs, No Parasternal Heave +ve B.Sounds, Abd Soft, Non tender,No rebound -guarding or rigidity. No Cyanosis, Clubbing or edema, No new Rash or bruise  Data Review   CBC w Diff:  Lab Results  Component Value Date   WBC 4.7 08/16/2020   HGB 14.0 08/16/2020   HGB 13.4 05/14/2020   HCT 43.5 08/16/2020   HCT 42.7 05/14/2020   PLT 300 08/16/2020   PLT 243 05/14/2020   LYMPHOPCT 20 08/16/2020   MONOPCT 9 08/16/2020   EOSPCT 1 08/16/2020   BASOPCT 0 08/16/2020    CMP:  Lab Results  Component Value Date   NA 143 08/16/2020   NA 145 (H) 05/14/2020   K 3.7 08/16/2020   CL 100 08/16/2020   CO2 34 (H) 08/16/2020   BUN 28 (H) 08/16/2020   BUN 15 05/14/2020   CREATININE 1.16 (H) 08/16/2020   CREATININE 1.14 (H) 05/12/2016   PROT 6.1 (L) 08/16/2020   PROT 6.9 05/14/2020   ALBUMIN 3.1 (L) 08/16/2020   ALBUMIN 4.4 05/14/2020   BILITOT 0.9  08/16/2020   BILITOT <0.2 05/14/2020   ALKPHOS 52 08/16/2020   AST 23 08/16/2020   ALT 29 08/16/2020  .   Total Time in preparing paper work, data evaluation and todays exam - 7 minutes  Phillips Climes M.D on 08/19/2020 at 9:09 AM  [facility]   Office  [phone]

## 2020-08-19 NOTE — Discharge Instructions (Signed)
° ° °  Person Under Monitoring Name: Heidi [name]  Location: [place] [address]   CORONAVIRUS DISEASE 2019 (COVID-19) Guidance for Persons Under Investigation You are being tested for the virus that causes coronavirus disease 2019 (COVID-19). Public health actions are necessary to ensure protection of your health and the health of others, and to prevent further spread of infection. COVID-19 is caused by a virus that can cause symptoms, such as fever, cough, and shortness of breath. The primary transmission from person to person is by coughing or sneezing. On August 04, 2018, the [place] announced a [employer] Emergency of International Concern and on August 05, 2018 the U.S. Department of Health and Human Services declared a public health emergency. If the virus that causesCOVID-19 spreads in the community, it could have severe public health consequences.  As a person under investigation for COVID-19, the [place] advises you to adhere to the following guidance until your test results are reported to you. If your test result is positive, you will receive additional information from your provider and your local health department at that time.   Remain at home until you are cleared by your health provider or public health authorities.   Keep a log of visitors to your home using the form provided. Any visitors to your home must be aware of your isolation status.  If you plan to move to a new address or leave the county, notify the local health department in your county.  Call a doctor or seek care if you have an urgent medical need. Before seeking medical care, call ahead and get instructions from the provider before arriving at the medical office, clinic or hospital. Notify them that you are being tested for the virus that causes COVID-19 so arrangements can be made, as  necessary, to prevent transmission to others in the healthcare setting. Next, notify the local health department in your county.  If a medical emergency arises and you need to call 911, inform the first responders that you are being tested for the virus that causes COVID-19. Next, notify the local health department in your county.  Adhere to all guidance set forth by the [place] for [facility] of patients that is based on guidance from the Center for Disease Control and Prevention with suspected or confirmed COVID-19. It is provided with this guidance for Persons Under Investigation.  Your health and the health of our community are our top priorities. Public Health officials remain available to provide assistance and counseling to you about COVID-19 and compliance with this guidance.  Provider: ____________________________________________________________ Date: ______/_____/_________  By signing below, you acknowledge that you have read and agree to comply with this Guidance for Persons Under Investigation. ______________________________________________________________ Date: ______/_____/_________  WHO DO I CALL? You can find a list of local health departments here: [email or web address] Health Department: ____________________________________________________________________ Contact Name: ________________________________________________________________________ Telephone: ___________________________________________________________________________  Marice Potter, [place], Communicable Disease Branch COVID-19 Guidance for Persons Under Investigation September 10, 2018

## 2020-08-19 NOTE — Telephone Encounter (Signed)
Pt is wanting a finger o2

## 2020-08-19 NOTE — Plan of Care (Incomplete)
  Problem: Coping: Goal: Psychosocial and spiritual needs will be supported Outcome: Progressing   Problem: Respiratory: Goal: Will maintain a patent airway Outcome: Progressing Goal: Complications related to the disease process, condition or treatment will be avoided or minimized Outcome: Progressing   Problem: Education: Goal: Knowledge of General Education information will improve Description: Including pain rating scale, medication(s)/side effects and non-pharmacologic comfort measures Outcome: Progressing   Problem: Health Behavior/Discharge Planning: Goal: Ability to manage health-related needs will improve Outcome: Progressing   Problem: Clinical Measurements: Goal: Ability to maintain clinical measurements within normal limits will improve Outcome: Progressing Goal: Will remain free from infection Outcome: Progressing Goal: Diagnostic test results will improve Outcome: Progressing Goal: Respiratory complications will improve Outcome: Progressing Goal: Cardiovascular complication will be avoided Outcome: Progressing   Problem: Activity: Goal: Risk for activity intolerance will decrease Outcome: Progressing   Problem: Nutrition: Goal: Adequate nutrition will be maintained Outcome: Progressing   Problem: Coping: Goal: Level of anxiety will decrease Outcome: Progressing   Problem: Elimination: Goal: Will not experience complications related to bowel motility Outcome: Progressing Goal: Will not experience complications related to urinary retention Outcome: Progressing   Problem: Pain Managment: Goal: General experience of comfort will improve Outcome: Progressing   Problem: Safety: Goal: Ability to remain free from injury will improve Outcome: Progressing   Problem: Skin Integrity: Goal: Risk for impaired skin integrity will decrease Outcome: Progressing

## 2020-08-19 NOTE — Progress Notes (Signed)
Physical Therapy Treatment Patient Details Name: Heidi [name] MRN: [id number] DOB: 08-05-1950 Today's Date: 08/19/2020    History of Present Illness Pt is a 70 y.o. female who tested (+) COVID-19 on 08/01/20, now admitted 08/09/20 with AMS, found to be hypoxic. Workup for acute hypoxic respiratory failure due to COVID-19 PNA, AKI. Head CT showing possible MCA infarct; MRI negative for acute injury. PMH includes asthma, HF, HTN, CKD 3, COPD, DM, obesity.   PT Comments    Pt progressing well with mobility; pt much more vibrant, alert and interactive this session, excited about d/c home. Pt able to transfer and ambulate mod indep with rollator; performing seated and standing ADL tasks with supervision for safety. SpO2 >/88% on 2L O2 [place] (pt wears 2L baseline), HR 90s-100s. Reviewed educ re: activity recommendations, energy conservation, importance of mobility. Would benefit from HHPT services to maximize functional mobility and independence upon return home.  Pt requesting ambulance transport home (Case Manager notified)    Follow Up Recommendations  Home health PT;Supervision - Intermittent (Alice aide)     Equipment Recommendations  None recommended by PT    Recommendations for Other Services       Precautions / Restrictions Precautions Precautions: Fall;Other (comment) Precaution Comments: 2L O2 [place] baseline Restrictions Weight Bearing Restrictions: No    Mobility  Bed Mobility               General bed mobility comments: Received sitting in recliner    Transfers Overall transfer level: Modified independent Equipment used: None;4-wheeled walker Transfers: Sit to/from Stand           General transfer comment: Sit<>stands from recliner with rollator and low toilet height without DME, reliant on use of grab bar to pull into standing  Ambulation/Gait Ambulation/Gait assistance: Supervision Gait Distance (Feet): 190 Feet Assistive device: 4-wheeled walker Gait  Pattern/deviations: Step-through pattern;Decreased stride length Gait velocity: Decreased   General Gait Details: Slow, steady gait in hallway with rollator, mod indep; no noted instability with head turns, stops and turning; ambulating in room without DME, supervision for safety. SpO2 >/88% on 2L O2. Pt declining further distance   Stairs             Wheelchair Mobility    Modified Rankin (Stroke Patients Only)       Balance                                            Cognition Arousal/Alertness: Awake/alert Behavior During Therapy: WFL for tasks assessed/performed Overall Cognitive Status: Within Functional Limits for tasks assessed                                 General Comments: Pt much more vibrant, interactive and talkative this session; forming clear sentences and better able to complete thoughts. Pt motivated for d/c today. Cognition WFL for simple tasks      Exercises      General Comments        Pertinent Vitals/Pain Pain Assessment: Faces Faces Pain Scale: No hurt Pain Intervention(s): Monitored during session    Home Living                      Prior Function            PT Goals (current goals  can now be found in the care plan section) Progress towards PT goals: Progressing toward goals    Frequency    Min 3X/week      PT Plan Current plan remains appropriate    Co-evaluation              AM-PAC PT "6 Clicks" Mobility   Outcome Measure  Help needed turning from your back to your side while in a flat bed without using bedrails?: None Help needed moving from lying on your back to sitting on the side of a flat bed without using bedrails?: None Help needed moving to and from a bed to a chair (including a wheelchair)?: None Help needed standing up from a chair using your arms (e.g., wheelchair or bedside chair)?: None Help needed to walk in hospital room?: A Little Help needed climbing 3-5  steps with a railing? : A Little 6 Click Score: 22    End of Session   Activity Tolerance: Patient tolerated treatment well Patient left: in chair;with call bell/phone within reach Nurse Communication: Mobility status PT Visit Diagnosis: Other abnormalities of gait and mobility (R26.89);Muscle weakness (generalized) (M62.81)     Time: 8657-8469 PT Time Calculation (min) (ACUTE ONLY): 24 min  Charges:  $Gait Training: 8-22 mins $Therapeutic Activity: 8-22 mins                    Mabeline Caras, PT, DPT Acute Rehabilitation Services  Pager [phone] Office [place] 08/19/2020, 10:23 AM

## 2020-08-19 NOTE — TOC Transition Note (Signed)
Transition of Care [facility]) - CM/SW Discharge Note   Patient Details  Name: Heidi [name] MRN: [id number] Date of Birth: 18-Sep-1950  [facility] CM/SW Contact:  Zenon Mayo, RN Phone Number: 08/19/2020, 10:32 AM   Clinical Narrative:    Patient is for dc today, NCM notified Gerogia with [facility] and daughter.  NCM asked staff RN  If she is ready for ptar to be called she states yes.  NCM scheduled ptar transport.    Final next level of care: [place] Barriers to Discharge: No Barriers Identified   Patient Goals and CMS Choice Patient states their goals for this hospitalization and ongoing recovery are:: go home CMS Medicare.gov Compare Post Acute Care list provided to:: Patient Choice offered to / list presented to : Patient  Discharge Placement                       Discharge Plan and Services   Discharge Planning Services: CM Consult Post Acute Care Choice: Home Health            DME Agency: NA       HH Arranged: PT,OT,Nurse's Aide,Social Work [employer] Agency: [facility] [employer] Date [place]: 08/18/20 Time [place]: 39 Representative spoke with at [place]: [place]  Social Determinants of Health ([place]) Interventions     Readmission Risk Interventions No flowsheet data found.

## 2020-08-19 NOTE — Telephone Encounter (Signed)
Copied from [place] [id number]. Topic: General - Other >> Aug 19, 2020  1:32 PM Celene Kras wrote: Reason for CRM: Pt called and is requesting to have an oxygen meter prescribed for her. She states that she just wants to make sure her oxygen levels are in order. Please advise.

## 2020-08-19 NOTE — Addendum Note (Signed)
Addended by: Karle Plumber B on: 08/19/2020 09:29 PM   Modules accepted: Orders

## 2020-08-20 ENCOUNTER — Telehealth: Payer: Self-pay | Admitting: Internal Medicine

## 2020-08-20 ENCOUNTER — Encounter: Payer: Self-pay | Admitting: Neurology

## 2020-08-20 ENCOUNTER — Telehealth: Payer: Self-pay

## 2020-08-20 NOTE — Telephone Encounter (Signed)
Copied from [place] [id number]. Topic: Quick Communication - See Telephone Encounter >> Aug 20, 2020  3:12 PM Loma [place] wrote: CRM for notification. See Telephone encounter for: 08/20/20.[facility], Heidi [name] ,and states the hospital called her for in home care for pt, Heidi [name] stated that she was going to ask the pt to get a HFU and then she would reach out for nursing care which hospital had requested from [place]. States that after pt is seen  by dr be happy to reach out and they will fu with pt as would like to work with dr  with ongoing orders.[facility] request dr reach out to pt.  Her Heidi [name]) number for reach out after pt is seen is [phone]

## 2020-08-20 NOTE — Telephone Encounter (Signed)
Transition Care Management Unsuccessful Follow-up Telephone Call  Date of discharge and from where:  08/19/2020, [facility]   Attempts:  1st Attempt  Reason for unsuccessful TCM follow-up call:  Left voice message - call placed to # [phone], call back requested to this CM.  Need to discuss scheduling a follow up appointment with PCP

## 2020-08-21 ENCOUNTER — Telehealth: Payer: Self-pay | Admitting: Internal Medicine

## 2020-08-21 ENCOUNTER — Telehealth: Payer: Self-pay

## 2020-08-21 NOTE — Telephone Encounter (Signed)
Copied from [place] [id number]. Topic: General - Other >> Aug 21, 2020 11:00 AM Yvette Rack wrote: Reason for CRM: Pt stated her body has been producing insulin on its own so she has not been taking her medication. Pt stated she checks her sugar in the middle of the night and after she eat something when she wakes up and check it the reading is usually around 75. Pt stated if she was to take her medication she could possibly be dead because it would be too much. Pt requests call back

## 2020-08-21 NOTE — Telephone Encounter (Signed)
Called pt x3 with no answer. Left HIPAA-compliant VM with instructions to return call to discuss recommendations.

## 2020-08-21 NOTE — Telephone Encounter (Signed)
[facility] has scheduled appt for pt for hosptial f/u for 4/12

## 2020-08-21 NOTE — Telephone Encounter (Signed)
Contacted pt and left a detailed vm informing pt that rx for pulse ox device is ready for pick up at the front desk and if she has any questions or concerns to give a call

## 2020-08-21 NOTE — Telephone Encounter (Signed)
Call placed to patient, left message requesting she call this CM back to schedule an appointment to be seen by Dr Wynetta Emery before April.

## 2020-08-21 NOTE — Telephone Encounter (Signed)
Transition Care Management Unsuccessful Follow-up Telephone Call  Date of discharge and from where:  08/19/2020, [facility]  Attempts:  2nd Attempt  Reason for unsuccessful TCM follow-up call:  Left voice message - call placed to # [phone], call back requested to this CM.  Need to discuss scheduling a follow up appointment with Dr Wynetta Emery

## 2020-08-21 NOTE — Telephone Encounter (Signed)
Will forward to provider  

## 2020-08-22 ENCOUNTER — Telehealth: Payer: Self-pay

## 2020-08-22 ENCOUNTER — Telehealth: Payer: Self-pay | Admitting: Pharmacist

## 2020-08-22 NOTE — Telephone Encounter (Signed)
Touched base with the patient today. Confirmed I was speaking to her using two identifiers. I introduced myself and the reason for my call.   Patient endorses occasional hypoglycemia that started after her most recent hospitalization. Episodes occur mostly overnight/early AM. Pt was admitted 08/10/2019 to 08/20/2019 for Covid-19 PNA and encephalopathy. While hospitalized she developed labile blood sugars requiring a dose reduction in her home Novolog 70/30 dose. Prior to her hospitalization, she was on 80u in the morning and 40 units in the evening. She was discharged from the hospital with 40u BID.   Today, she reveals that hypoglycemia has resolved. It has not occurred for several days. She is somewhat of a poor historian and difficult to understand. When her episodes were occurring, she stopped her insulin completely. Per pt, she stopped insulin at some point last week after seeing home CBGs in the 40s, 50s, and 70s. She treated successfully and verbalizes understanding of a proper hypoglycemia management plan.   Now, her sugars have been averaging in the 200s since discontinuing her insulin. She tells me that she started taking the insulin again this morning. She reports that she took 40 units with breakfast and her most recent post prandial blood sugar was 210 about an hour ago.   I recommend to continue 40u BID of 70/30 as prescribed and monitor for now. She has an appointment with me in 2 weeks for further assessment.

## 2020-08-22 NOTE — Telephone Encounter (Signed)
Transition Care Management Follow-up Telephone Call  Date of discharge and from where: 08/19/2020, [facility]   How have you been since you were released from the hospital? She is worried about her blood sugars dropping too low.  She just spoke to [place] about her insulin regime.  She is going to administer novolog 70/30 40 units in the morning. She will monitor her blood sugars at night and is not planning to take insulin at that time because she is concerned about her blood sugars dropping into the 40's.  She does not eat large meals and is hesitant about taking insulin at meal times.  She is going to keep a log of her blood sugars and meet with Luke,RPH  09/06/2020 to review her blood sugars/insulin orders  Any questions or concerns? Yes - noted above regarding blood sugars.   She was diagnosed with COVID 08/01/2020 - it has been 21 days since then and she said she has no symptoms and has not taken any medication for a fever and wanted to confirm that she has completed isolation.   She is requesting an order for a home BP monitor and oximeter.  She said she has nebulizer solution but does not have a nebulizer and she has not used one in years. Informed her that Dr Wynetta Emery would be notified of her requests. She did not have a preferred DME provider.    Items Reviewed:  Did the pt receive and understand the discharge instructions provided? Yes   Medications obtained and verified? Yes  -she said she has all of her medications and did not need to review the med list.  Other? No   Any new allergies since your discharge? No   Do you have support at home? Yes  - her grandsons live with her and they work the night shift.   Home Care and Equipment/Supplies: Were home health services ordered? yes If so, what is the name of the agency? [facility] up a time to come to the patient's home? Yes -she said that they are coming to see her tomorrow.  Were  any new equipment or medical supplies ordered?  No What is the name of the medical supply agency? N/a Were you able to get the supplies/equipment? not applicable Do you have any questions related to the use of the equipment or supplies? No   She has O2 fro [employer] that she uses @ 2L continuously   Functional Questionnaire: (I = Independent and D = Dependent) ADLs: independent, has cane to use with ambulation  Follow up appointments reviewed:   PCP Hospital f/u appt confirmed? Yes  - Dr Wynetta Emery 08/30/2020.  She needs to confirm if it is in person or virtual . .  [facility] f/u appt confirmed? Yes  - pulmonary - 11/01/2020.   Are transportation arrangements needed? No  - she said her daughter will drive. Informed her that she should contact her insurance company because they may pay for transportation to medical appointments. She said she would prefer her daughter to drive, she did not want to be near anyone else as she is trying to stay healthy.   If their condition worsens, is the pt aware to call PCP or go to the Emergency Dept.? Yes  Was the patient provided with contact information for the PCP's office or ED? Yes  Was to pt encouraged to call back with questions or concerns? Yes

## 2020-08-23 DIAGNOSIS — E1122 Type 2 diabetes mellitus with diabetic chronic kidney disease: Secondary | ICD-10-CM | POA: Diagnosis not present

## 2020-08-23 DIAGNOSIS — J9621 Acute and chronic respiratory failure with hypoxia: Secondary | ICD-10-CM | POA: Diagnosis not present

## 2020-08-23 DIAGNOSIS — I130 Hypertensive heart and chronic kidney disease with heart failure and stage 1 through stage 4 chronic kidney disease, or unspecified chronic kidney disease: Secondary | ICD-10-CM | POA: Diagnosis not present

## 2020-08-23 DIAGNOSIS — I5033 Acute on chronic diastolic (congestive) heart failure: Secondary | ICD-10-CM | POA: Diagnosis not present

## 2020-08-23 DIAGNOSIS — J9602 Acute respiratory failure with hypercapnia: Secondary | ICD-10-CM | POA: Diagnosis not present

## 2020-08-23 DIAGNOSIS — U071 COVID-19: Secondary | ICD-10-CM | POA: Diagnosis not present

## 2020-08-23 DIAGNOSIS — E1165 Type 2 diabetes mellitus with hyperglycemia: Secondary | ICD-10-CM | POA: Diagnosis not present

## 2020-08-23 DIAGNOSIS — J1282 Pneumonia due to coronavirus disease 2019: Secondary | ICD-10-CM | POA: Diagnosis not present

## 2020-08-23 DIAGNOSIS — J449 Chronic obstructive pulmonary disease, unspecified: Secondary | ICD-10-CM | POA: Diagnosis not present

## 2020-08-23 DIAGNOSIS — N1831 Chronic kidney disease, stage 3a: Secondary | ICD-10-CM | POA: Diagnosis not present

## 2020-08-25 DIAGNOSIS — I10 Essential (primary) hypertension: Secondary | ICD-10-CM | POA: Diagnosis not present

## 2020-08-25 DIAGNOSIS — J45909 Unspecified asthma, uncomplicated: Secondary | ICD-10-CM | POA: Diagnosis not present

## 2020-08-26 NOTE — Telephone Encounter (Signed)
Encounter opened in error

## 2020-08-27 ENCOUNTER — Telehealth: Payer: Self-pay | Admitting: Internal Medicine

## 2020-08-27 ENCOUNTER — Other Ambulatory Visit: Payer: Self-pay | Admitting: Physician Assistant

## 2020-08-27 DIAGNOSIS — J9602 Acute respiratory failure with hypercapnia: Secondary | ICD-10-CM | POA: Diagnosis not present

## 2020-08-27 DIAGNOSIS — J9621 Acute and chronic respiratory failure with hypoxia: Secondary | ICD-10-CM | POA: Diagnosis not present

## 2020-08-27 DIAGNOSIS — I10 Essential (primary) hypertension: Secondary | ICD-10-CM

## 2020-08-27 DIAGNOSIS — U071 COVID-19: Secondary | ICD-10-CM | POA: Diagnosis not present

## 2020-08-27 DIAGNOSIS — J449 Chronic obstructive pulmonary disease, unspecified: Secondary | ICD-10-CM | POA: Diagnosis not present

## 2020-08-27 DIAGNOSIS — J1282 Pneumonia due to coronavirus disease 2019: Secondary | ICD-10-CM | POA: Diagnosis not present

## 2020-08-27 DIAGNOSIS — E1122 Type 2 diabetes mellitus with diabetic chronic kidney disease: Secondary | ICD-10-CM | POA: Diagnosis not present

## 2020-08-27 DIAGNOSIS — N1831 Chronic kidney disease, stage 3a: Secondary | ICD-10-CM | POA: Diagnosis not present

## 2020-08-27 DIAGNOSIS — I5033 Acute on chronic diastolic (congestive) heart failure: Secondary | ICD-10-CM | POA: Diagnosis not present

## 2020-08-27 DIAGNOSIS — I130 Hypertensive heart and chronic kidney disease with heart failure and stage 1 through stage 4 chronic kidney disease, or unspecified chronic kidney disease: Secondary | ICD-10-CM | POA: Diagnosis not present

## 2020-08-27 DIAGNOSIS — E1165 Type 2 diabetes mellitus with hyperglycemia: Secondary | ICD-10-CM | POA: Diagnosis not present

## 2020-08-27 NOTE — Telephone Encounter (Signed)
Called [facility] VO given.

## 2020-08-27 NOTE — Telephone Encounter (Signed)
Home Health Verbal Orders - Caller/Agency: [facility] Number: [phone] secure vm can be left  Requesting Home health  OT Frequency: 1x time a week for 7 weeks

## 2020-08-28 DIAGNOSIS — J449 Chronic obstructive pulmonary disease, unspecified: Secondary | ICD-10-CM | POA: Diagnosis not present

## 2020-08-28 DIAGNOSIS — N1831 Chronic kidney disease, stage 3a: Secondary | ICD-10-CM | POA: Diagnosis not present

## 2020-08-28 DIAGNOSIS — I130 Hypertensive heart and chronic kidney disease with heart failure and stage 1 through stage 4 chronic kidney disease, or unspecified chronic kidney disease: Secondary | ICD-10-CM | POA: Diagnosis not present

## 2020-08-28 DIAGNOSIS — U071 COVID-19: Secondary | ICD-10-CM | POA: Diagnosis not present

## 2020-08-28 DIAGNOSIS — J9602 Acute respiratory failure with hypercapnia: Secondary | ICD-10-CM | POA: Diagnosis not present

## 2020-08-28 DIAGNOSIS — E1165 Type 2 diabetes mellitus with hyperglycemia: Secondary | ICD-10-CM | POA: Diagnosis not present

## 2020-08-28 DIAGNOSIS — J9621 Acute and chronic respiratory failure with hypoxia: Secondary | ICD-10-CM | POA: Diagnosis not present

## 2020-08-28 DIAGNOSIS — J1282 Pneumonia due to coronavirus disease 2019: Secondary | ICD-10-CM | POA: Diagnosis not present

## 2020-08-28 DIAGNOSIS — E1122 Type 2 diabetes mellitus with diabetic chronic kidney disease: Secondary | ICD-10-CM | POA: Diagnosis not present

## 2020-08-28 DIAGNOSIS — I5033 Acute on chronic diastolic (congestive) heart failure: Secondary | ICD-10-CM | POA: Diagnosis not present

## 2020-08-29 DIAGNOSIS — J9602 Acute respiratory failure with hypercapnia: Secondary | ICD-10-CM | POA: Diagnosis not present

## 2020-08-29 DIAGNOSIS — E1122 Type 2 diabetes mellitus with diabetic chronic kidney disease: Secondary | ICD-10-CM | POA: Diagnosis not present

## 2020-08-29 DIAGNOSIS — I5033 Acute on chronic diastolic (congestive) heart failure: Secondary | ICD-10-CM | POA: Diagnosis not present

## 2020-08-29 DIAGNOSIS — E1165 Type 2 diabetes mellitus with hyperglycemia: Secondary | ICD-10-CM | POA: Diagnosis not present

## 2020-08-29 DIAGNOSIS — N1831 Chronic kidney disease, stage 3a: Secondary | ICD-10-CM | POA: Diagnosis not present

## 2020-08-29 DIAGNOSIS — J9621 Acute and chronic respiratory failure with hypoxia: Secondary | ICD-10-CM | POA: Diagnosis not present

## 2020-08-29 DIAGNOSIS — J449 Chronic obstructive pulmonary disease, unspecified: Secondary | ICD-10-CM | POA: Diagnosis not present

## 2020-08-29 DIAGNOSIS — J1282 Pneumonia due to coronavirus disease 2019: Secondary | ICD-10-CM | POA: Diagnosis not present

## 2020-08-29 DIAGNOSIS — I130 Hypertensive heart and chronic kidney disease with heart failure and stage 1 through stage 4 chronic kidney disease, or unspecified chronic kidney disease: Secondary | ICD-10-CM | POA: Diagnosis not present

## 2020-08-29 DIAGNOSIS — U071 COVID-19: Secondary | ICD-10-CM | POA: Diagnosis not present

## 2020-08-29 NOTE — Telephone Encounter (Signed)
Patient missed her Botox appointment earlier this month due to being in the hospital. Patient called me today to reschedule. I advised the patient that the next available appointment is 3/29. We scheduled her appointment for that day, however patient states that her systolic blood pressure has reached 160 due to her headaches.

## 2020-08-29 NOTE — Telephone Encounter (Signed)
No she needs to stay with Dr Jaynee Eagles. I will keep an eye out for a cancellation.

## 2020-08-30 ENCOUNTER — Other Ambulatory Visit: Payer: Self-pay

## 2020-08-30 ENCOUNTER — Ambulatory Visit: Payer: Medicare HMO | Attending: Internal Medicine | Admitting: Internal Medicine

## 2020-08-30 ENCOUNTER — Encounter: Payer: Self-pay | Admitting: Internal Medicine

## 2020-08-30 VITALS — BP 113/74 | HR 80 | Resp 16 | Wt 226.6 lb

## 2020-08-30 DIAGNOSIS — N182 Chronic kidney disease, stage 2 (mild): Secondary | ICD-10-CM | POA: Diagnosis not present

## 2020-08-30 DIAGNOSIS — I10 Essential (primary) hypertension: Secondary | ICD-10-CM | POA: Diagnosis not present

## 2020-08-30 DIAGNOSIS — G4733 Obstructive sleep apnea (adult) (pediatric): Secondary | ICD-10-CM

## 2020-08-30 DIAGNOSIS — U071 COVID-19: Secondary | ICD-10-CM | POA: Diagnosis not present

## 2020-08-30 DIAGNOSIS — E1122 Type 2 diabetes mellitus with diabetic chronic kidney disease: Secondary | ICD-10-CM

## 2020-08-30 DIAGNOSIS — Z9981 Dependence on supplemental oxygen: Secondary | ICD-10-CM

## 2020-08-30 DIAGNOSIS — I5032 Chronic diastolic (congestive) heart failure: Secondary | ICD-10-CM | POA: Diagnosis not present

## 2020-08-30 DIAGNOSIS — Z23 Encounter for immunization: Secondary | ICD-10-CM

## 2020-08-30 DIAGNOSIS — Z09 Encounter for follow-up examination after completed treatment for conditions other than malignant neoplasm: Secondary | ICD-10-CM

## 2020-08-30 DIAGNOSIS — E041 Nontoxic single thyroid nodule: Secondary | ICD-10-CM | POA: Diagnosis not present

## 2020-08-30 DIAGNOSIS — J9611 Chronic respiratory failure with hypoxia: Secondary | ICD-10-CM | POA: Diagnosis not present

## 2020-08-30 DIAGNOSIS — J1282 Pneumonia due to coronavirus disease 2019: Secondary | ICD-10-CM

## 2020-08-30 LAB — GLUCOSE, POCT (MANUAL RESULT ENTRY): POC Glucose: 137 mg/dl — AB (ref 70–99)

## 2020-08-30 LAB — POCT GLYCOSYLATED HEMOGLOBIN (HGB A1C): HbA1c, POC (controlled diabetic range): 9.9 % — AB (ref 0.0–7.0)

## 2020-08-30 NOTE — Progress Notes (Signed)
Patient ID: Heidi [name], female    DOB: Mar 03, 1951  MRN: [id number]  CC: Hospitalization Follow-up Date of hospitalization: 2/4-14/2022. Date of call from case worker: 08/19/2020  Subjective: Heidi [name] is a 70 y.o. female who presents for TOC visit Her concerns today include: history of HTN,DM 2with retinopathy, HL, CKD 3,diastolicCHF, hypoxic respiratory failure/cough variant asthma, migraines([facility] on Botox inj),OSA (noton BiPAP),UACS,GERD, OA knees,DDD/spondylosis of cervical spine ([facility])obesity, renal cyst, LT thyroid nodule.  Patient hospitalized with Covid pneumonitis and bacterial pneumonia.  She presented with change in mental status with hypoxia and hypercapnia.  CTA of the chest was negative for PE but did revealed changes consistent with Covid pneumonia and left lower lobe infiltrate.  She was treated with IV steroids, remdesivir and Actemra.  She had increased oxygen requirement but was eventually able to be weaned to her baseline of 2 L nasal cannula.  Also assessed to have some decompensation of diastolic CHF for which she was diuresed.  MRI of the brain and MRA of the head without acute findings.  Creatinine had peaked at 1.4.  By the time of discharge it was 1.1.  Insulin dose was decreased to 70 units in the morning and 45 units in the evening.  Today: Patient reports she is doing much better.  Breathing is back to her baseline.  She is on 2 L of O2 continuously.  Denies any significant shortness of breath with walking.  She reports mild swelling in the lower legs.  She has gained 2 pounds since being home.  She limits salt in the foods.  Denies any PND or orthopnea.  HCTZ was discontinued during hospitalization.  She was continued on furosemide. -Wanting to have an x-ray done to see if Covid is gone. -She is unvaccinated for COVID-19.  She plans to get the vaccine and is agreeable for me to submit her name to our COVID-19  vaccine clinic to be called for an appointment.  DM type II: She is on NovoLog 70/30.  She had called last week reporting low blood sugar episodes.  She was taking 70 units in the morning and 45 units in the evening.  Our clinical pharmacist advised her to cut back to 40 units in the morning and 40 units in the evening.  However she has been taking 80 units once a day.  She states that if she takes the evening dose of 45 units the blood sugars drop too low.  Not able to afford her co-pay on Farxiga so she was never able to get that medicine.  - She has several blood sugar readings recorded with her.  Morning blood sugar range has been 77-124 with the highest being 185.  Blood sugar range in the evenings before dinner is 118-140 with the highest being 189.  OSA: Endorses loud snoring and some daytime sleepiness.  Last sleep study was done more than 5 years ago in [place].  Patient states that she was on CPAP for time but then was able to be taken off of it.  Thyroid nodule.  She has nodule on the left thyroid for which she is overdue for follow-up with thyroid ultrasound.  Denies any compressive symptoms.   Patient Active Problem List   Diagnosis Date Noted  . Pneumonia due to COVID-19 virus 08/10/2020  . Encephalopathy due to COVID-19 virus 08/10/2020  . CAP (community acquired pneumonia) 08/10/2020  . Acute respiratory failure with hypoxia and hypercapnia ([place]) 08/10/2020  . Acute kidney  injury superimposed on CKD (HCC) 08/10/2020  . COVID 08/10/2020  . Encounter for medication review and counseling 03/18/2020  . Medication management 02/28/2020  . Exertional dyspnea 01/03/2020  . Multifocal pneumonia 11/24/2019  . Acute respiratory failure (HCC) 11/24/2019  . Breast nodule 11/24/2019  . Chronic respiratory failure with hypoxia, on home oxygen therapy (HCC) 11/13/2019  . Atypical chest pain 08/24/2019  . Cervical myelopathy (HCC) 08/16/2018  . Diabetic retinopathy of right eye  associated with type 2 diabetes mellitus (HCC) 08/16/2018  . Upper airway cough syndrome 05/03/2018  . Morbid obesity due to excess calories (HCC) complicated by hbp/ dm / hyperlipidemia 09/10/2017  . Cough variant asthma vs UACS/vcd on ACEi  09/09/2017  . Anxiety and depression 09/06/2017  . Mild persistent asthma without complication 09/06/2017  . Paranoia (HCC) 09/06/2017  . Torn left ear lobe 11/20/2016  . Chronic migraine w/o aura w/o status migrainosus, not intractable 06/27/2016  . Post-menopausal atrophic vaginitis 11/14/2015  . Primary osteoarthritis of both knees 07/18/2015  . GERD (gastroesophageal reflux disease) 05/08/2015  . Renal cyst 05/08/2015  . Adenomatous polyp of colon 03/26/2015  . Essential hypertension 02/12/2015  . Cognitive deficit due to old head trauma 02/12/2015  . Asthma, chronic 02/12/2015  . Chronic left shoulder pain 02/11/2015  . Thyroid nodule 12/18/2014  . Chronic neck pain   . HLD (hyperlipidemia)   . OSA treated with BiPAP 11/27/2014  . Obesity hypoventilation syndrome (HCC) 11/27/2014  . Demand ischemia (HCC)   . Uncontrolled type 2 diabetes mellitus with hyperglycemia (HCC)   . Cardiomegaly 11/25/2014  . CHF (congestive heart failure) (HCC) 11/25/2014  . DM type 2 causing CKD stage 2 (HCC) 11/25/2014     Current Outpatient Medications on File Prior to Visit  Medication Sig Dispense Refill  . Accu-Chek FastClix Lancets MISC Use as directed to test blood sugar three times daily DX E11.65 102 each 6  . albuterol (PROVENTIL) (2.5 MG/3ML) 0.083% nebulizer solution USE 1 VIAL VIA NEBULIZER EVERY 6 HOURS AS NEEDED FOR WHEEZING OR SHORTNESS OF BREATH (Patient taking differently: Take 2.5 mg by nebulization every 6 (six) hours as needed for wheezing or shortness of breath.) 25 vial 0  . albuterol (VENTOLIN HFA) 108 (90 Base) MCG/ACT inhaler Inhale 1-2 puffs into the lungs every 4 (four) hours as needed for wheezing or shortness of breath. 8 g 3  .  aspirin 81 MG chewable tablet Chew 81 mg by mouth daily.     . atenolol (TENORMIN) 25 MG tablet TAKE 1 TABLET(25 MG) BY MOUTH DAILY 90 tablet 1  . atorvastatin (LIPITOR) 40 MG tablet TAKE 1 TABLET BY MOUTH DAILY AT 6 PM (Patient taking differently: Take 40 mg by mouth every evening. TAKE 1 TABLET BY MOUTH DAILY AT 6 PM) 90 tablet 0  . benzonatate (TESSALON) 100 MG capsule Take 1 capsule (100 mg total) by mouth every 8 (eight) hours. 21 capsule 0  . Blood Glucose Monitoring Suppl (ACCU-CHEK GUIDE ME) w/Device KIT Use as directed to test blood sugar three times daily DX E11.65 1 kit 0  . calcium-vitamin D (OSCAL WITH D) 500-200 MG-UNIT TABS tablet Calcium 500 + D 500 mg-5 mcg (200 unit) tablet  TK 1 T PO D    . famotidine (PEPCID) 20 MG tablet Take 1 tablet (20 mg total) by mouth 2 (two) times daily. 60 tablet 4  . fluticasone-salmeterol (ADVAIR HFA) 115-21 MCG/ACT inhaler Inhale 2 puffs into the lungs 2 (two) times daily. 1 each 2  .   furosemide (LASIX) 40 MG tablet Take 1 tablet (40 mg total) by mouth daily. 30 tablet 3  . glucose blood (ACCU-CHEK GUIDE) test strip Use as directed to test blood sugar three times daily DX E11.65 100 each 6  . insulin aspart protamine- aspart (NOVOLOG MIX 70/30) (70-30) 100 UNIT/ML injection Inject 0.4 mLs (40 Units total) into the skin 2 (two) times daily with a meal. Inject 70 units in the morning and 45 units in the evening. 10 mL 11  . INSULIN SYRINGE 1CC/29G 29G X 1/2" 1 ML MISC USE THREE TIMES DAILY AS DIRECTED 100 each 3  . loratadine (CLARITIN) 10 MG tablet Take 10 mg by mouth daily as needed for allergies.    . losartan (COZAAR) 50 MG tablet Take 1 tablet (50 mg total) by mouth daily. 90 tablet 3  . montelukast (SINGULAIR) 10 MG tablet Take 1 tablet (10 mg total) by mouth at bedtime. 90 tablet 2  . Multiple Vitamin (MULTIVITAMIN WITH MINERALS) TABS tablet Take 1 tablet by mouth daily.    . nitroGLYCERIN (NITROSTAT) 0.3 MG SL tablet Place 1 tablet (0.3 mg  total) under the tongue every 5 (five) minutes as needed for chest pain. Max 3 tablets in 15 minutes 30 tablet 1  . olopatadine (PATANOL) 0.1 % ophthalmic solution Place 1 drop into both eyes 2 (two) times daily. (Patient taking differently: Place 1 drop into both eyes 2 (two) times daily as needed for allergies.) 5 mL 0  . pantoprazole (PROTONIX) 40 MG tablet TAKE 1 TABLET(40 MG) BY MOUTH DAILY (Patient taking differently: Take 40 mg by mouth at bedtime.) 90 tablet 1  . Potassium Chloride ER 20 MEQ TBCR TAKE 1 TABLET BY MOUTH DAILY (Patient taking differently: Take 20 mEq by mouth daily.) 90 tablet 3  . tetrahydrozoline-zinc (VISINE-AC) 0.05-0.25 % ophthalmic solution Place 1 drop into both eyes 3 (three) times daily as needed.    . topiramate (TOPAMAX) 25 MG capsule TAKE 3 CAPSULES(75 MG) BY MOUTH TWICE DAILY (Patient taking differently: Take 75 mg by mouth 2 (two) times daily.) 270 capsule 0  . traMADol (ULTRAM) 50 MG tablet Take 1 tablet (50 mg total) by mouth every 8 (eight) hours as needed (Prescription must last 1 month). 40 tablet 0  . [DISCONTINUED] Fexofenadine HCl (MUCINEX ALLERGY PO) Take 1 tablet by mouth daily as needed (for allergy).     No current facility-administered medications on file prior to visit.    Allergies  Allergen Reactions  . Januvia [Sitagliptin]     Chest pains    Social History   Socioeconomic History  . Marital status: Widowed    Spouse name: Not on file  . Number of children: 4  . Years of education: Not on file  . Highest education level: Not on file  Occupational History  . Occupation: Disabled  Tobacco Use  . Smoking status: Never Smoker  . Smokeless tobacco: Never Used  Vaping Use  . Vaping Use: Never used  Substance and Sexual Activity  . Alcohol use: No  . Drug use: No  . Sexual activity: Not on file  Other Topics Concern  . Not on file  Social History Narrative   Lives with daughter   Caffeine use: 1 cup coffee per day   Social  Determinants of Health   Financial Resource Strain: Not on file  Food Insecurity: Not on file  Transportation Needs: Not on file  Physical Activity: Not on file  Stress: Not on file  Social Connections:   Not on file  Intimate Partner Violence: Not on file    Family History  Problem Relation Age of Onset  . Colon cancer Father   . Diabetes type II Sister   . Diabetes type II Brother   . Colon cancer Sister   . Migraines Neg Hx   . Breast cancer Neg Hx     Past Surgical History:  Procedure Laterality Date  . ABDOMINAL HYSTERECTOMY    . CATARACT EXTRACTION Left   . CATARACT EXTRACTION     LT 01/2017, 02/2017 on RT  . CESAREAN SECTION      ROS: Review of Systems Negative except as stated above  PHYSICAL EXAM: BP 113/74   Pulse 80   Resp 16   Wt 226 lb 9.6 oz (102.8 kg)   SpO2 97%   BMI 38.90 kg/m   Recorded pulse ox is on 2 L of nasal cannula. Physical Exam  General appearance - alert, well appearing, and in no distress Mental status - normal mood, behavior, speech, dress, motor activity, and thought processes Neck - supple, no significant adenopathy.  No thyroid nodules palpated. Chest -breath sounds mildly decreased at the bases but no crackles wheezes or rhonchi is heard.  Heart - normal rate, regular rhythm, normal S1, S2, no murmurs, rubs, clicks or gallops Extremities -trace lower extremity edema.    Results for orders placed or performed in visit on 08/30/20  POCT glucose (manual entry)  Result Value Ref Range   POC Glucose 137 (A) 70 - 99 mg/dl  POCT glycosylated hemoglobin (Hb A1C)  Result Value Ref Range   Hemoglobin A1C     HbA1c POC (<> result, manual entry)     HbA1c, POC (prediabetic range)     HbA1c, POC (controlled diabetic range) 9.9 (A) 0.0 - 7.0 %    CMP Latest Ref Rng & Units 08/16/2020 08/15/2020 08/14/2020  Glucose 70 - 99 mg/dL 148(H) 59(L) 191(H)  BUN 8 - 23 mg/dL 28(H) 30(H) 32(H)  Creatinine 0.44 - 1.00 mg/dL 1.16(H) 1.03(H) 1.09(H)   Sodium 135 - 145 mmol/L 143 147(H) 144  Potassium 3.5 - 5.1 mmol/L 3.7 3.9 4.5  Chloride 98 - 111 mmol/L 100 97(L) 100  CO2 22 - 32 mmol/L 34(H) 40(H) 32  Calcium 8.9 - 10.3 mg/dL 9.3 9.2 8.8(L)  Total Protein 6.5 - 8.1 g/dL 6.1(L) 6.0(L) 5.7(L)  Total Bilirubin 0.3 - 1.2 mg/dL 0.9 0.5 1.2  Alkaline Phos 38 - 126 U/L 52 52 44  AST 15 - 41 U/L 23 22 33  ALT 0 - 44 U/L _0 Lipid Panel     Component Value Date/Time   CHOL 138 05/14/2020 1538   TRIG 141 08/10/2020 0015   HDL 57 05/14/2020 1538   CHOLHDL 2.4 05/14/2020 1538   CHOLHDL 2.1 08/11/2016 1109   VLDL 28 08/11/2016 1109   LDLCALC 54 05/14/2020 1538    CBC    Component Value Date/Time   WBC 4.7 08/16/2020 0201   RBC 5.06 08/16/2020 0201   HGB 14.0 08/16/2020 0201   HGB 13.4 05/14/2020 1538   HCT 43.5 08/16/2020 0201   HCT 42.7 05/14/2020 1538   PLT 300 08/16/2020 0201   PLT 243 05/14/2020 1538   MCV 86.0 08/16/2020 0201   MCV 86 05/14/2020 1538   MCH 27.7 08/16/2020 0201   MCHC 32.2 08/16/2020 0201   RDW 14.1 08/16/2020 0201   RDW 13.8 05/14/2020 1538   LYMPHSABS 0.9 08/16/2020 0201   LYMPHSABS  4.3 (H) 08/23/2017 1655   MONOABS 0.4 08/16/2020 0201   EOSABS 0.0 08/16/2020 0201   EOSABS 0.4 08/23/2017 1655   BASOSABS 0.0 08/16/2020 0201   BASOSABS 0.1 08/23/2017 1655    ASSESSMENT AND PLAN: 1. Hospital discharge follow-up 2. Pneumonia due to COVID-19 virus Patient clinically improved.  I will hold off repeating chest x-ray at least for another 6 weeks as imaging findings tend to lag behind clinical improvement.  Strongly encouraged her to get COVID-19 vaccine.  She is agreeable to receiving the vaccine.  I have submitted her name to our COVID-19 vaccine clinic so that she can be called with an appointment. -She reports having received the flu vaccine already at [facility] last fall.  3. Chronic respiratory failure with hypoxia, on home oxygen therapy (HCC) 4. Chronic diastolic congestive heart  failure (HCC) 5. OSA (obstructive sleep apnea) She will continue home O2. I recommend that we do a sleep study as untreated sleep apnea can make CHF and other underlying respiratory illnesses worse.  She does endorse loud snoring and daytime sleepiness.  She is agreeable to having the sleep study done. - PSG Sleep Study; Future  6. Type 2 diabetes mellitus with stage 2 chronic kidney disease, unspecified whether long term insulin use (HCC) Advised that she change the NovoLog 7030 to 40 units in the morning and 40 units in the evenings with meals.  I would like to get her to an endocrinologist to help with management.  She agreed to the referral. - POCT glucose (manual entry) - POCT glycosylated hemoglobin (Hb A1C) - CBC - Comprehensive metabolic panel - Ambulatory referral to Endocrinology  7. Thyroid nodule - US THYROID; Future  8. Essential hypertension At goal.  9. Need for COVID-19 vaccine See above.    Patient was given the opportunity to ask questions.  Patient verbalized understanding of the plan and was able to repeat key elements of the plan.   Orders Placed This Encounter  Procedures  . US THYROID  . CBC  . Comprehensive metabolic panel  . Ambulatory referral to Endocrinology  . POCT glucose (manual entry)  . POCT glycosylated hemoglobin (Hb A1C)  . PSG Sleep Study     Requested Prescriptions    No prescriptions requested or ordered in this encounter    Return in about 2 months (around 10/28/2020).  Deborah Johnson, MD, FACP 

## 2020-08-30 NOTE — Patient Instructions (Signed)
Please change your insulin to 40 units in the morning and 40 units in the evenings. I have submitted the referral for you to see an endocrinologist.  I have submitted the referral for the sleep study. I have submitted the referral for the thyroid ultrasound.

## 2020-08-30 NOTE — Telephone Encounter (Signed)
Heidi [name], Patient no showed her last appointment. I would ask her whether she would be ok with Megan or Amy, I suspect she will say no but otherwise she will ave to wait for my next availability. We will watch for any cancellations. thanks

## 2020-08-30 NOTE — Progress Notes (Signed)
Pt states her pain is coming from her head and back

## 2020-08-31 LAB — COMPREHENSIVE METABOLIC PANEL
ALT: 35 IU/L — ABNORMAL HIGH (ref 0–32)
AST: 24 IU/L (ref 0–40)
Albumin/Globulin Ratio: 2.2 (ref 1.2–2.2)
Albumin: 4.3 g/dL (ref 3.8–4.8)
Alkaline Phosphatase: 81 IU/L (ref 44–121)
BUN/Creatinine Ratio: 9 — ABNORMAL LOW (ref 12–28)
BUN: 11 mg/dL (ref 8–27)
Bilirubin Total: 0.3 mg/dL (ref 0.0–1.2)
CO2: 24 mmol/L (ref 20–29)
Calcium: 9.6 mg/dL (ref 8.7–10.3)
Chloride: 107 mmol/L — ABNORMAL HIGH (ref 96–106)
Creatinine, Ser: 1.16 mg/dL — ABNORMAL HIGH (ref 0.57–1.00)
GFR calc Af Amer: 56 mL/min/{1.73_m2} — ABNORMAL LOW (ref >59)
GFR calc non Af Amer: 48 mL/min/{1.73_m2} — ABNORMAL LOW (ref >59)
Globulin, Total: 2 g/dL (ref 1.5–4.5)
Glucose: 58 mg/dL — ABNORMAL LOW (ref 65–99)
Potassium: 4.2 mmol/L (ref 3.5–5.2)
Sodium: 147 mmol/L — ABNORMAL HIGH (ref 134–144)
Total Protein: 6.3 g/dL (ref 6.0–8.5)

## 2020-08-31 LAB — CBC
Hematocrit: 41.9 % (ref 34.0–46.6)
Hemoglobin: 13.3 g/dL (ref 11.1–15.9)
MCH: 27.9 pg (ref 26.6–33.0)
MCHC: 31.7 g/dL (ref 31.5–35.7)
MCV: 88 fL (ref 79–97)
Platelets: 128 10*3/uL — ABNORMAL LOW (ref 150–450)
RBC: 4.76 x10E6/uL (ref 3.77–5.28)
RDW: 15.2 % (ref 11.7–15.4)
WBC: 3.6 10*3/uL (ref 3.4–10.8)

## 2020-09-01 NOTE — Progress Notes (Signed)
Let patient know that her blood cell counts are normal except for mildly low platelet cell count.  Platelets are the blood cells that help blood clot normally.  We will recheck this on subsequent visit.  Kidney function is not 100% but stable.  Sodium level is mildly elevated.  Make sure that she drinks several glasses of water daily.

## 2020-09-02 ENCOUNTER — Telehealth: Payer: Self-pay

## 2020-09-02 NOTE — Telephone Encounter (Signed)
Contacted pt to go over lab results pt is aware and doesn't have any questions or concerns 

## 2020-09-02 NOTE — Telephone Encounter (Signed)
We had a cancellation this afternoon, I called patient to offer this to her. Unfortunately she is not able to get transportation at this moment, but did ask to be called if we happen to have another cancellation.

## 2020-09-04 DIAGNOSIS — J449 Chronic obstructive pulmonary disease, unspecified: Secondary | ICD-10-CM | POA: Diagnosis not present

## 2020-09-04 DIAGNOSIS — I5033 Acute on chronic diastolic (congestive) heart failure: Secondary | ICD-10-CM | POA: Diagnosis not present

## 2020-09-04 DIAGNOSIS — J9602 Acute respiratory failure with hypercapnia: Secondary | ICD-10-CM | POA: Diagnosis not present

## 2020-09-04 DIAGNOSIS — J1282 Pneumonia due to coronavirus disease 2019: Secondary | ICD-10-CM | POA: Diagnosis not present

## 2020-09-04 DIAGNOSIS — N1831 Chronic kidney disease, stage 3a: Secondary | ICD-10-CM | POA: Diagnosis not present

## 2020-09-04 DIAGNOSIS — I130 Hypertensive heart and chronic kidney disease with heart failure and stage 1 through stage 4 chronic kidney disease, or unspecified chronic kidney disease: Secondary | ICD-10-CM | POA: Diagnosis not present

## 2020-09-04 DIAGNOSIS — E1122 Type 2 diabetes mellitus with diabetic chronic kidney disease: Secondary | ICD-10-CM | POA: Diagnosis not present

## 2020-09-04 DIAGNOSIS — J9621 Acute and chronic respiratory failure with hypoxia: Secondary | ICD-10-CM | POA: Diagnosis not present

## 2020-09-04 DIAGNOSIS — E1165 Type 2 diabetes mellitus with hyperglycemia: Secondary | ICD-10-CM | POA: Diagnosis not present

## 2020-09-04 DIAGNOSIS — U071 COVID-19: Secondary | ICD-10-CM | POA: Diagnosis not present

## 2020-09-05 ENCOUNTER — Ambulatory Visit (HOSPITAL_COMMUNITY): Payer: Medicare HMO | Attending: Internal Medicine

## 2020-09-05 DIAGNOSIS — I130 Hypertensive heart and chronic kidney disease with heart failure and stage 1 through stage 4 chronic kidney disease, or unspecified chronic kidney disease: Secondary | ICD-10-CM | POA: Diagnosis not present

## 2020-09-05 DIAGNOSIS — J9621 Acute and chronic respiratory failure with hypoxia: Secondary | ICD-10-CM | POA: Diagnosis not present

## 2020-09-05 DIAGNOSIS — J1282 Pneumonia due to coronavirus disease 2019: Secondary | ICD-10-CM | POA: Diagnosis not present

## 2020-09-05 DIAGNOSIS — U071 COVID-19: Secondary | ICD-10-CM | POA: Diagnosis not present

## 2020-09-05 DIAGNOSIS — E1122 Type 2 diabetes mellitus with diabetic chronic kidney disease: Secondary | ICD-10-CM | POA: Diagnosis not present

## 2020-09-05 DIAGNOSIS — J9602 Acute respiratory failure with hypercapnia: Secondary | ICD-10-CM | POA: Diagnosis not present

## 2020-09-05 DIAGNOSIS — J449 Chronic obstructive pulmonary disease, unspecified: Secondary | ICD-10-CM | POA: Diagnosis not present

## 2020-09-05 DIAGNOSIS — E1165 Type 2 diabetes mellitus with hyperglycemia: Secondary | ICD-10-CM | POA: Diagnosis not present

## 2020-09-05 DIAGNOSIS — I5033 Acute on chronic diastolic (congestive) heart failure: Secondary | ICD-10-CM | POA: Diagnosis not present

## 2020-09-05 DIAGNOSIS — N1831 Chronic kidney disease, stage 3a: Secondary | ICD-10-CM | POA: Diagnosis not present

## 2020-09-06 ENCOUNTER — Ambulatory Visit: Payer: Medicare HMO | Admitting: Pharmacist

## 2020-09-09 ENCOUNTER — Telehealth: Payer: Self-pay | Admitting: Internal Medicine

## 2020-09-09 NOTE — Telephone Encounter (Signed)
Fine with me

## 2020-09-09 NOTE — Telephone Encounter (Signed)
Spoke with patient. She is requesting to switch providers from Dr. Melvyn Novas to any other provider in the office. I asked her if she had a preference to a female doctor or female doctor, she does not. She prefers a provider with experience with asthma. She decided on Dr. Shearon Stalls.   Dr. Melvyn Novas, are you ok with her switching to Dr. Shearon Stalls?   Dr. Shearon Stalls, are you ok with accept her as a new patient?

## 2020-09-10 DIAGNOSIS — J449 Chronic obstructive pulmonary disease, unspecified: Secondary | ICD-10-CM | POA: Diagnosis not present

## 2020-09-10 DIAGNOSIS — E1165 Type 2 diabetes mellitus with hyperglycemia: Secondary | ICD-10-CM | POA: Diagnosis not present

## 2020-09-10 DIAGNOSIS — J9602 Acute respiratory failure with hypercapnia: Secondary | ICD-10-CM | POA: Diagnosis not present

## 2020-09-10 DIAGNOSIS — J9621 Acute and chronic respiratory failure with hypoxia: Secondary | ICD-10-CM | POA: Diagnosis not present

## 2020-09-10 DIAGNOSIS — E1122 Type 2 diabetes mellitus with diabetic chronic kidney disease: Secondary | ICD-10-CM | POA: Diagnosis not present

## 2020-09-10 DIAGNOSIS — J1282 Pneumonia due to coronavirus disease 2019: Secondary | ICD-10-CM | POA: Diagnosis not present

## 2020-09-10 DIAGNOSIS — N1831 Chronic kidney disease, stage 3a: Secondary | ICD-10-CM | POA: Diagnosis not present

## 2020-09-10 DIAGNOSIS — I5033 Acute on chronic diastolic (congestive) heart failure: Secondary | ICD-10-CM | POA: Diagnosis not present

## 2020-09-10 DIAGNOSIS — I130 Hypertensive heart and chronic kidney disease with heart failure and stage 1 through stage 4 chronic kidney disease, or unspecified chronic kidney disease: Secondary | ICD-10-CM | POA: Diagnosis not present

## 2020-09-10 DIAGNOSIS — U071 COVID-19: Secondary | ICD-10-CM | POA: Diagnosis not present

## 2020-09-10 NOTE — Telephone Encounter (Signed)
Sure - that's fine

## 2020-09-10 NOTE — Telephone Encounter (Signed)
I have called the pt and she is aware of change of doctors per her request.  She will keep her appt with TP on 03/11 and she will schedule appt with ND after this date.

## 2020-09-12 ENCOUNTER — Telehealth: Payer: Self-pay | Admitting: Internal Medicine

## 2020-09-12 ENCOUNTER — Other Ambulatory Visit: Payer: Self-pay | Admitting: Internal Medicine

## 2020-09-12 DIAGNOSIS — M4302 Spondylolysis, cervical region: Secondary | ICD-10-CM

## 2020-09-12 MED ORDER — TRAMADOL HCL 50 MG PO TABS
50.0000 mg | ORAL_TABLET | Freq: Three times a day (TID) | ORAL | 0 refills | Status: DC | PRN
Start: 2020-09-12 — End: 2020-10-29

## 2020-09-12 NOTE — Telephone Encounter (Signed)
Requested medication (s) are due for refill today: no  Requested medication (s) are on the active medication list:  yes  Last refill: 06/08/2020  Future visit scheduled: no  Notes to clinic:  this refill cannot be delegated    Requested Prescriptions  Pending Prescriptions Disp Refills   traMADol (ULTRAM) 50 MG tablet 40 tablet 0    Sig: Take 1 tablet (50 mg total) by mouth every 8 (eight) hours as needed (Prescription must last 1 month).      There is no refill protocol information for this order

## 2020-09-12 NOTE — Telephone Encounter (Signed)
Patient called to request an order for a heating pad for her chronic pain.  Stated she had one a while ago but it had malfunctioned.  Please advise and let patient know if this is possible.  CB# [id number]

## 2020-09-12 NOTE — Telephone Encounter (Signed)
Pt called to see if her request for Tramadol refill was sent to pharmacy / please advise

## 2020-09-12 NOTE — Telephone Encounter (Signed)
Medication Refill - Medication:   traMADol (ULTRAM) 50 MG tablet     Preferred Pharmacy (with phone number or street name): [facility] [place], [address] [place] Phone:  [phone]  Fax:  [phone]       Agent: Please be advised that RX refills may take up to 3 business days. We ask that you follow-up with your pharmacy.

## 2020-09-12 NOTE — Telephone Encounter (Signed)
Requested medication (s) are due for refill today:yes  Requested medication (s) are on the active medication list: yes  Last refill: 06/08/20  #40  0 refills  Future visit scheduled no  Notes to clinic: not delegated  Requested Prescriptions  Pending Prescriptions Disp Refills   traMADol (ULTRAM) 50 MG tablet [Pharmacy Med Name: TRAMADOL 50MG  TABLETS] 40 tablet     Sig: TAKE 1 TABLET EVERY 8 HOURS AS NEEDED. MUST LAST 30 DAYS.      Not Delegated - Analgesics:  Opioid Agonists Failed - 09/12/2020  4:18 PM      Failed - This refill cannot be delegated      Failed - Urine Drug Screen completed in last 360 days      Passed - Valid encounter within last 6 months    Recent Outpatient Visits           1 week ago Hospital discharge follow-up   [place], Deborah B, MD   4 months ago Type 2 diabetes mellitus with stage 2 chronic kidney disease, unspecified whether long term insulin use ([place])   [place] Karle Plumber B, MD   9 months ago Type 2 diabetes mellitus with stage 2 chronic kidney disease, unspecified whether long term insulin use [facility])   [place], Levada Dy M, [place]   10 months ago Need for vaccination against Streptococcus pneumoniae   [place], Annie Main L, RPH-CPP   10 months ago 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, with long-term current use of insulin, macular edema presence unspecified, unspecified retinopathy severity [facility])   [place], Deborah B, MD       Future Appointments             Tomorrow [place], Fonnie Mu, NP [facility]

## 2020-09-13 ENCOUNTER — Other Ambulatory Visit: Payer: Self-pay

## 2020-09-13 ENCOUNTER — Ambulatory Visit (INDEPENDENT_AMBULATORY_CARE_PROVIDER_SITE_OTHER): Payer: Medicare HMO

## 2020-09-13 ENCOUNTER — Encounter: Payer: Self-pay | Admitting: Adult Health

## 2020-09-13 ENCOUNTER — Ambulatory Visit: Payer: Medicare HMO | Admitting: Adult Health

## 2020-09-13 VITALS — BP 130/80 | HR 79 | Temp 97.6°F | Ht 64.0 in | Wt 230.8 lb

## 2020-09-13 DIAGNOSIS — G4733 Obstructive sleep apnea (adult) (pediatric): Secondary | ICD-10-CM

## 2020-09-13 DIAGNOSIS — Z9981 Dependence on supplemental oxygen: Secondary | ICD-10-CM | POA: Diagnosis not present

## 2020-09-13 DIAGNOSIS — J9611 Chronic respiratory failure with hypoxia: Secondary | ICD-10-CM

## 2020-09-13 DIAGNOSIS — U071 COVID-19: Secondary | ICD-10-CM

## 2020-09-13 DIAGNOSIS — J4530 Mild persistent asthma, uncomplicated: Secondary | ICD-10-CM | POA: Diagnosis not present

## 2020-09-13 DIAGNOSIS — R918 Other nonspecific abnormal finding of lung field: Secondary | ICD-10-CM | POA: Diagnosis not present

## 2020-09-13 DIAGNOSIS — J1282 Pneumonia due to coronavirus disease 2019: Secondary | ICD-10-CM | POA: Diagnosis not present

## 2020-09-13 DIAGNOSIS — I5032 Chronic diastolic (congestive) heart failure: Secondary | ICD-10-CM

## 2020-09-13 DIAGNOSIS — J189 Pneumonia, unspecified organism: Secondary | ICD-10-CM | POA: Diagnosis not present

## 2020-09-13 NOTE — Assessment & Plan Note (Signed)
History of OSA/OHS.  Recent admission with hypercarbic respiratory failure.  Patient has an upcoming sleep study that is pending.  I have advised her to follow-up for this as planned

## 2020-09-13 NOTE — Progress Notes (Signed)
Called and spoke with patient, advised of results/recommendations per Rexene Edison NP.  She wanted to know about getting the covid vaccine.  Advised her that she will have to wait 90 days to receive it since she had covid.  She verbalized understanding.

## 2020-09-13 NOTE — Progress Notes (Signed)
@Patient  ID: Heidi [name], female    DOB: 02/17/1951, 70 y.o.   MRN: [id number]  Chief Complaint  Patient presents with  . Follow-up    Referring provider: Ladell Pier, MD  HPI: 70 year old female never smoker followed for cough variant asthma, chronic respiratory failure on home oxygen 2 L,  OSA/OHS not on CPAP Medical history significant for chronic diastolic heart failure stage III chronic kidney disease, DM  COVID-24 July 9922 complicated by Covid Pneumonia   TEST/EVENTS :  Spirometry 09/09/2017  FEV1 0.87 (44%)  Ratio 78 with min curvature w/in 2 h of study with symptoms at time of study  - FENO 09/09/2017  =   8  - trial off acei 09/09/2017   - PFT's  10/27/2017  FEV1 1.07 (54 % ) ratio 86  p no % improvement from saba p nothing  prior to study with DLCO  40 % corrects to 116  % for alv volume   CT chest Nov 24, 2019 hazy bilateral groundglass airspace opacities in the lung bases.  Negative for PE  CT chest August 10, 2020 - for PE.  Cardiomegaly.  Widespread patchy pulmonary infiltrates consistent with viral pneumonia fairly dense consolidation in the left lower lobe   09/13/2020 Follow up: Post hospital follow-up, Covid pneumonia Patient presents for a post hospital follow-up.  Patient was hospitalized last month after testing positive for COVID-19 August 02, 2019.  Patient had respiratory distress and altered mental status.  She was admitted to the hospital found to have widespread pulmonary infiltrates on CT scan.  CT was negative for PE.  Felt to have post viral pneumonitis with superimposed left lower lobe pneumonia.  She required initial BiPAP support and increased oxygen support.  She was hypercarbic on admission.  She was treated with IV antibiotics, Remdesivir.  And steroids.  She did receive Actemra. She did have volume overload and required IV diuresis.  CT head showed a nonspecific MCA finding.  Subsequent MRI and MRA head showed no acute findings.  Since  discharge patient is feeling better with less cough and shortness of breath.  She remains on oxygen 2 L.  She has been seen by her primary care provider who has ordered a sleep study.  She remains on Advair twice daily.  She denies any increased albuterol use.  She does complain of postnasal drainage nasal congestion and intermittent cough with thick mucus mainly clear.  Denies any fever, hemoptysis, increased leg swelling.   Allergies  Allergen Reactions  . Januvia [Sitagliptin]     Chest pains    Immunization History  Administered Date(s) Administered  . Influenza Whole 05/11/2014  . Influenza, High Dose Seasonal PF 04/08/2018, 02/25/2019  . Influenza,inj,Quad PF,6+ Mos 03/26/2015, 05/12/2016, 04/18/2017  . Pneumococcal Conjugate-13 01/14/2015  . Pneumococcal Polysaccharide-23 12/19/2013, 11/13/2019  . Tdap 11/20/2016    Past Medical History:  Diagnosis Date  . Adenomatous colon polyp   . Anemia   . Anxiety   . Asthma   . CHF (congestive heart failure) ([place])   . COPD (chronic obstructive pulmonary disease) ([place])   . Diabetes mellitus without complication ([place])   . High cholesterol   . Hypertension   . Left knee injury   . Pneumonia     Tobacco History: Social History   Tobacco Use  Smoking Status Never Smoker  Smokeless Tobacco Never Used   Counseling given: Not Answered   Outpatient Medications Prior to Visit  Medication Sig Dispense Refill  . Accu-Chek  FastClix Lancets MISC Use as directed to test blood sugar three times daily DX E11.65 102 each 6  . albuterol (PROVENTIL) (2.5 MG/3ML) 0.083% nebulizer solution USE 1 VIAL VIA NEBULIZER EVERY 6 HOURS AS NEEDED FOR WHEEZING OR SHORTNESS OF BREATH (Patient taking differently: Take 2.5 mg by nebulization every 6 (six) hours as needed for wheezing or shortness of breath.) 25 vial 0  . albuterol (VENTOLIN HFA) 108 (90 Base) MCG/ACT inhaler Inhale 1-2 puffs into the lungs every 4 (four) hours as needed for wheezing or  shortness of breath. 8 g 3  . aspirin 81 MG chewable tablet Chew 81 mg by mouth daily.     Marland Kitchen atenolol (TENORMIN) 25 MG tablet TAKE 1 TABLET(25 MG) BY MOUTH DAILY 90 tablet 1  . atorvastatin (LIPITOR) 40 MG tablet TAKE 1 TABLET BY MOUTH DAILY AT 6 PM (Patient taking differently: Take 40 mg by mouth every evening. TAKE 1 TABLET BY MOUTH DAILY AT 6 PM) 90 tablet 0  . benzonatate (TESSALON) 100 MG capsule Take 1 capsule (100 mg total) by mouth every 8 (eight) hours. 21 capsule 0  . Blood Glucose Monitoring Suppl (ACCU-CHEK GUIDE ME) w/Device KIT Use as directed to test blood sugar three times daily DX E11.65 1 kit 0  . calcium-vitamin D (OSCAL WITH D) 500-200 MG-UNIT TABS tablet Calcium 500 + D 500 mg-5 mcg (200 unit) tablet  TK 1 T PO D    . famotidine (PEPCID) 20 MG tablet Take 1 tablet (20 mg total) by mouth 2 (two) times daily. 60 tablet 4  . fluticasone-salmeterol (ADVAIR HFA) 115-21 MCG/ACT inhaler Inhale 2 puffs into the lungs 2 (two) times daily. 1 each 2  . furosemide (LASIX) 40 MG tablet Take 1 tablet (40 mg total) by mouth daily. 30 tablet 3  . glucose blood (ACCU-CHEK GUIDE) test strip Use as directed to test blood sugar three times daily DX E11.65 100 each 6  . insulin aspart protamine- aspart (NOVOLOG MIX 70/30) (70-30) 100 UNIT/ML injection Inject 0.4 mLs (40 Units total) into the skin 2 (two) times daily with a meal. Inject 70 units in the morning and 45 units in the evening. 10 mL 11  . INSULIN SYRINGE 1CC/29G 29G X 1/2" 1 ML MISC USE THREE TIMES DAILY AS DIRECTED 100 each 3  . loratadine (CLARITIN) 10 MG tablet Take 10 mg by mouth daily as needed for allergies.    Marland Kitchen losartan (COZAAR) 50 MG tablet Take 1 tablet (50 mg total) by mouth daily. 90 tablet 3  . montelukast (SINGULAIR) 10 MG tablet Take 1 tablet (10 mg total) by mouth at bedtime. 90 tablet 2  . Multiple Vitamin (MULTIVITAMIN WITH MINERALS) TABS tablet Take 1 tablet by mouth daily.    . nitroGLYCERIN (NITROSTAT) 0.3 MG SL  tablet Place 1 tablet (0.3 mg total) under the tongue every 5 (five) minutes as needed for chest pain. Max 3 tablets in 15 minutes 30 tablet 1  . olopatadine (PATANOL) 0.1 % ophthalmic solution Place 1 drop into both eyes 2 (two) times daily. (Patient taking differently: Place 1 drop into both eyes 2 (two) times daily as needed for allergies.) 5 mL 0  . pantoprazole (PROTONIX) 40 MG tablet TAKE 1 TABLET(40 MG) BY MOUTH DAILY (Patient taking differently: Take 40 mg by mouth at bedtime.) 90 tablet 1  . Potassium Chloride ER 20 MEQ TBCR TAKE 1 TABLET BY MOUTH DAILY (Patient taking differently: Take 20 mEq by mouth daily.) 90 tablet 3  . tetrahydrozoline-zinc (  VISINE-AC) 0.05-0.25 % ophthalmic solution Place 1 drop into both eyes 3 (three) times daily as needed.    . topiramate (TOPAMAX) 25 MG capsule TAKE 3 CAPSULES(75 MG) BY MOUTH TWICE DAILY (Patient taking differently: Take 75 mg by mouth 2 (two) times daily.) 270 capsule 0  . traMADol (ULTRAM) 50 MG tablet Take 1 tablet (50 mg total) by mouth every 8 (eight) hours as needed (Prescription must last 1 month). 40 tablet 0   No facility-administered medications prior to visit.     Review of Systems:   Constitutional:   No  weight loss, night sweats,  Fevers, chills, +fatigue, or  lassitude.  HEENT:   No headaches,  Difficulty swallowing,  Tooth/dental problems, or  Sore throat,                No sneezing, itching, ear ache,  +nasal congestion, post nasal drip,   CV:  No chest pain,  Orthopnea, PND, swelling in lower extremities, anasarca, dizziness, palpitations, syncope.   GI  No heartburn, indigestion, abdominal pain, nausea, vomiting, diarrhea, change in bowel habits, loss of appetite, bloody stools.   Resp:  .  No chest wall deformity  Skin: no rash or lesions.  GU: no dysuria, change in color of urine, no urgency or frequency.  No flank pain, no hematuria   MS:  No joint pain or swelling.  No decreased range of motion.  No back  pain.    Physical Exam  BP 130/80 (BP Location: Left Arm, Patient Position: Sitting, Cuff Size: Large)   Pulse 79   Temp 97.6 F (36.4 C) (Temporal)   Ht 5' 4"  (1.626 m)   Wt 230 lb 12.8 oz (104.7 kg)   SpO2 94%   BMI 39.62 kg/m   GEN: A/Ox3; pleasant , NAD, BMI 39, on oxygen   HEENT:  [place]/AT,  EACs-clear, TMs-wnl, NOSE-clear, THROAT-clear, no lesions, no postnasal drip or exudate noted.   NECK:  Supple w/ fair ROM; no JVD; normal carotid impulses w/o bruits; no thyromegaly or nodules palpated; no lymphadenopathy.    RESP  Clear  P & A; w/o, wheezes/ rales/ or rhonchi. no accessory muscle use, no dullness to percussion  CARD:  RRR, no m/r/g, tr  peripheral edema, pulses intact, no cyanosis or clubbing.  GI:   Soft & nt; nml bowel sounds; no organomegaly or masses detected.   Musco: Warm bil, no deformities or joint swelling noted.   Neuro: alert, no focal deficits noted.    Skin: Warm, no lesions or rashes    Lab Results:   CBC    Component Value Date/Time   WBC 3.6 08/30/2020 1141   WBC 4.7 08/16/2020 0201   RBC 4.76 08/30/2020 1141   RBC 5.06 08/16/2020 0201   HGB 13.3 08/30/2020 1141   HCT 41.9 08/30/2020 1141   PLT 128 (L) 08/30/2020 1141   MCV 88 08/30/2020 1141   MCH 27.9 08/30/2020 1141   MCH 27.7 08/16/2020 0201   MCHC 31.7 08/30/2020 1141   MCHC 32.2 08/16/2020 0201   RDW 15.2 08/30/2020 1141   LYMPHSABS 0.9 08/16/2020 0201   LYMPHSABS 4.3 (H) 08/23/2017 1655   MONOABS 0.4 08/16/2020 0201   EOSABS 0.0 08/16/2020 0201   EOSABS 0.4 08/23/2017 1655   BASOSABS 0.0 08/16/2020 0201   BASOSABS 0.1 08/23/2017 1655    BMET    Component Value Date/Time   NA 147 (H) 08/30/2020 1141   K 4.2 08/30/2020 1141   CL 107 (H) 08/30/2020  1141   CO2 24 08/30/2020 1141   GLUCOSE 58 (L) 08/30/2020 1141   GLUCOSE 148 (H) 08/16/2020 0201   BUN 11 08/30/2020 1141   CREATININE 1.16 (H) 08/30/2020 1141   CREATININE 1.14 (H) 05/12/2016 1648   CALCIUM 9.6  08/30/2020 1141   GFRNONAA 48 (L) 08/30/2020 1141   GFRNONAA 51 (L) 08/16/2020 0201   GFRNONAA 51 (L) 05/12/2016 1648   GFRAA 56 (L) 08/30/2020 1141   GFRAA 59 (L) 05/12/2016 1648    BNP    Component Value Date/Time   BNP 23.6 08/16/2020 0133    ProBNP No results found for: PROBNP  Imaging: No results found.    PFT Results Latest Ref Rng & Units 10/27/2017  FVC-Pre L 1.24  FVC-Predicted Pre % 49  FVC-Post L 1.14  FVC-Predicted Post % 45  Pre FEV1/FVC % % 86  Post FEV1/FCV % % 87  FEV1-Pre L 1.07  FEV1-Predicted Pre % 54  FEV1-Post L 1.00  DLCO uncorrected ml/min/mmHg 9.77  DLCO UNC% % 40  DLVA Predicted % 116  TLC L 3.04  TLC % Predicted % 60  RV % Predicted % 62    Lab Results  Component Value Date   NITRICOXIDE 8 09/09/2017        Assessment & Plan:   Asthma, chronic History of asthma.  Previous pulmonary function testing showed restrictive lung disease.  Patient is continue on Advair now.  She has had recent COVID-19.  We will hold off on repeat PFTs.  Previous CT scan prior to COVID shows some bibasilar opacities.  Consider in 3 months if continue to have respiratory symptoms repeat PFTs if DLCO remains abnormal consider high-resolution CT to rule out underlying interstitial process.  CHF (congestive heart failure) Chronic diastolic heart failure appears euvolemic on exams.  Patient is continue on current regimen.  OSA treated with BiPAP History of OSA/OHS.  Recent admission with hypercarbic respiratory failure.  Patient has an upcoming sleep study that is pending.  I have advised her to follow-up for this as planned  Chronic respiratory failure with hypoxia, on home oxygen therapy (HCC) Chronic respiratory failure on oxygen since August 2021.  Patient is oxygen demands are back to baseline since it hospitalization. Continue on oxygen 2 L to maintain O2 saturations greater than 88 to 90%.  Pneumonia due to COVID-19 virus Recent hospitalization with  [id number] complicated by pneumonitis and superimposed pneumonia.  Patient is clinically improving.  We will check chest x-ray today.  Most likely will need follow-up PFTs and possible high-resolution CT chest in 3 months.  Plan  Patient Instructions  Continue on Advair 2 puffs Twice daily , rinse after use.  Mucinex DM Twice daily  As needed   Continue on Oxygen 2l/m  Chest xray today .  Albuterol inhaler As needed   Activity as tolerated  Healthy weight loss.   Start Claritin 32m At bedtime  .  Saline nasal rinses As needed    Follow up for Sleep study as planned   Follow up with Dr. DShearon Stallsin 6 weeks and As needed           TRexene Edison NP 09/13/2020

## 2020-09-13 NOTE — Assessment & Plan Note (Signed)
Chronic diastolic heart failure appears euvolemic on exams.  Patient is continue on current regimen.

## 2020-09-13 NOTE — Telephone Encounter (Signed)
Contacted pt and made aware that she can purchase a heating pad over the counter and her Tramadol was sent yesterday to the pharmacy. Pt states she understands and doesn't have any questions or concerns

## 2020-09-13 NOTE — Assessment & Plan Note (Signed)
Recent hospitalization with [id number] complicated by pneumonitis and superimposed pneumonia.  Patient is clinically improving.  We will check chest x-ray today.  Most likely will need follow-up PFTs and possible high-resolution CT chest in 3 months.  Plan  Patient Instructions  Continue on Advair 2 puffs Twice daily , rinse after use.  Mucinex DM Twice daily  As needed   Continue on Oxygen 2l/m  Chest xray today .  Albuterol inhaler As needed   Activity as tolerated  Healthy weight loss.   Start Claritin 10mg  At bedtime  .  Saline nasal rinses As needed    Follow up for Sleep study as planned   Follow up with Dr. Shearon Stalls in 6 weeks and As needed

## 2020-09-13 NOTE — Assessment & Plan Note (Signed)
Chronic respiratory failure on oxygen since August 2021.  Patient is oxygen demands are back to baseline since it hospitalization. Continue on oxygen 2 L to maintain O2 saturations greater than 88 to 90%.

## 2020-09-13 NOTE — Assessment & Plan Note (Signed)
History of asthma.  Previous pulmonary function testing showed restrictive lung disease.  Patient is continue on Advair now.  She has had recent COVID-19.  We will hold off on repeat PFTs.  Previous CT scan prior to COVID shows some bibasilar opacities.  Consider in 3 months if continue to have respiratory symptoms repeat PFTs if DLCO remains abnormal consider high-resolution CT to rule out underlying interstitial process.

## 2020-09-13 NOTE — Patient Instructions (Addendum)
Continue on Advair 2 puffs Twice daily , rinse after use.  Mucinex DM Twice daily  As needed   Continue on Oxygen 2l/m  Chest xray today .  Albuterol inhaler As needed   Activity as tolerated  Healthy weight loss.   Start Claritin 10mg  At bedtime  .  Saline nasal rinses As needed    Follow up for Sleep study as planned   Follow up with Dr. Shearon Stalls in 6 weeks and As needed  (30 min slot)

## 2020-09-18 DIAGNOSIS — J1282 Pneumonia due to coronavirus disease 2019: Secondary | ICD-10-CM | POA: Diagnosis not present

## 2020-09-18 DIAGNOSIS — J449 Chronic obstructive pulmonary disease, unspecified: Secondary | ICD-10-CM | POA: Diagnosis not present

## 2020-09-18 DIAGNOSIS — U071 COVID-19: Secondary | ICD-10-CM | POA: Diagnosis not present

## 2020-09-18 DIAGNOSIS — J9621 Acute and chronic respiratory failure with hypoxia: Secondary | ICD-10-CM | POA: Diagnosis not present

## 2020-09-18 DIAGNOSIS — I130 Hypertensive heart and chronic kidney disease with heart failure and stage 1 through stage 4 chronic kidney disease, or unspecified chronic kidney disease: Secondary | ICD-10-CM | POA: Diagnosis not present

## 2020-09-18 DIAGNOSIS — I5033 Acute on chronic diastolic (congestive) heart failure: Secondary | ICD-10-CM | POA: Diagnosis not present

## 2020-09-18 DIAGNOSIS — E1165 Type 2 diabetes mellitus with hyperglycemia: Secondary | ICD-10-CM | POA: Diagnosis not present

## 2020-09-18 DIAGNOSIS — J9602 Acute respiratory failure with hypercapnia: Secondary | ICD-10-CM | POA: Diagnosis not present

## 2020-09-18 DIAGNOSIS — E1122 Type 2 diabetes mellitus with diabetic chronic kidney disease: Secondary | ICD-10-CM | POA: Diagnosis not present

## 2020-09-18 DIAGNOSIS — N1831 Chronic kidney disease, stage 3a: Secondary | ICD-10-CM | POA: Diagnosis not present

## 2020-09-19 DIAGNOSIS — E1122 Type 2 diabetes mellitus with diabetic chronic kidney disease: Secondary | ICD-10-CM | POA: Diagnosis not present

## 2020-09-19 DIAGNOSIS — J1282 Pneumonia due to coronavirus disease 2019: Secondary | ICD-10-CM | POA: Diagnosis not present

## 2020-09-19 DIAGNOSIS — E1165 Type 2 diabetes mellitus with hyperglycemia: Secondary | ICD-10-CM | POA: Diagnosis not present

## 2020-09-19 DIAGNOSIS — J449 Chronic obstructive pulmonary disease, unspecified: Secondary | ICD-10-CM | POA: Diagnosis not present

## 2020-09-19 DIAGNOSIS — J9602 Acute respiratory failure with hypercapnia: Secondary | ICD-10-CM | POA: Diagnosis not present

## 2020-09-19 DIAGNOSIS — I130 Hypertensive heart and chronic kidney disease with heart failure and stage 1 through stage 4 chronic kidney disease, or unspecified chronic kidney disease: Secondary | ICD-10-CM | POA: Diagnosis not present

## 2020-09-19 DIAGNOSIS — N1831 Chronic kidney disease, stage 3a: Secondary | ICD-10-CM | POA: Diagnosis not present

## 2020-09-19 DIAGNOSIS — I5033 Acute on chronic diastolic (congestive) heart failure: Secondary | ICD-10-CM | POA: Diagnosis not present

## 2020-09-19 DIAGNOSIS — U071 COVID-19: Secondary | ICD-10-CM | POA: Diagnosis not present

## 2020-09-19 DIAGNOSIS — J9621 Acute and chronic respiratory failure with hypoxia: Secondary | ICD-10-CM | POA: Diagnosis not present

## 2020-09-20 DIAGNOSIS — U071 COVID-19: Secondary | ICD-10-CM | POA: Diagnosis not present

## 2020-09-20 DIAGNOSIS — E1122 Type 2 diabetes mellitus with diabetic chronic kidney disease: Secondary | ICD-10-CM | POA: Diagnosis not present

## 2020-09-20 DIAGNOSIS — J1282 Pneumonia due to coronavirus disease 2019: Secondary | ICD-10-CM | POA: Diagnosis not present

## 2020-09-20 DIAGNOSIS — I5033 Acute on chronic diastolic (congestive) heart failure: Secondary | ICD-10-CM | POA: Diagnosis not present

## 2020-09-20 DIAGNOSIS — J449 Chronic obstructive pulmonary disease, unspecified: Secondary | ICD-10-CM | POA: Diagnosis not present

## 2020-09-20 DIAGNOSIS — E1165 Type 2 diabetes mellitus with hyperglycemia: Secondary | ICD-10-CM | POA: Diagnosis not present

## 2020-09-20 DIAGNOSIS — J9621 Acute and chronic respiratory failure with hypoxia: Secondary | ICD-10-CM | POA: Diagnosis not present

## 2020-09-20 DIAGNOSIS — J9602 Acute respiratory failure with hypercapnia: Secondary | ICD-10-CM | POA: Diagnosis not present

## 2020-09-20 DIAGNOSIS — I130 Hypertensive heart and chronic kidney disease with heart failure and stage 1 through stage 4 chronic kidney disease, or unspecified chronic kidney disease: Secondary | ICD-10-CM | POA: Diagnosis not present

## 2020-09-20 DIAGNOSIS — N1831 Chronic kidney disease, stage 3a: Secondary | ICD-10-CM | POA: Diagnosis not present

## 2020-09-22 DIAGNOSIS — J45909 Unspecified asthma, uncomplicated: Secondary | ICD-10-CM | POA: Diagnosis not present

## 2020-09-22 DIAGNOSIS — I10 Essential (primary) hypertension: Secondary | ICD-10-CM | POA: Diagnosis not present

## 2020-09-25 DIAGNOSIS — J449 Chronic obstructive pulmonary disease, unspecified: Secondary | ICD-10-CM | POA: Diagnosis not present

## 2020-09-25 DIAGNOSIS — J9602 Acute respiratory failure with hypercapnia: Secondary | ICD-10-CM | POA: Diagnosis not present

## 2020-09-25 DIAGNOSIS — I5033 Acute on chronic diastolic (congestive) heart failure: Secondary | ICD-10-CM | POA: Diagnosis not present

## 2020-09-25 DIAGNOSIS — E1122 Type 2 diabetes mellitus with diabetic chronic kidney disease: Secondary | ICD-10-CM | POA: Diagnosis not present

## 2020-09-25 DIAGNOSIS — J1282 Pneumonia due to coronavirus disease 2019: Secondary | ICD-10-CM | POA: Diagnosis not present

## 2020-09-25 DIAGNOSIS — U071 COVID-19: Secondary | ICD-10-CM | POA: Diagnosis not present

## 2020-09-25 DIAGNOSIS — J9621 Acute and chronic respiratory failure with hypoxia: Secondary | ICD-10-CM | POA: Diagnosis not present

## 2020-09-25 DIAGNOSIS — N1831 Chronic kidney disease, stage 3a: Secondary | ICD-10-CM | POA: Diagnosis not present

## 2020-09-25 DIAGNOSIS — E1165 Type 2 diabetes mellitus with hyperglycemia: Secondary | ICD-10-CM | POA: Diagnosis not present

## 2020-09-25 DIAGNOSIS — I130 Hypertensive heart and chronic kidney disease with heart failure and stage 1 through stage 4 chronic kidney disease, or unspecified chronic kidney disease: Secondary | ICD-10-CM | POA: Diagnosis not present

## 2020-09-26 ENCOUNTER — Encounter: Payer: Self-pay | Admitting: Neurology

## 2020-09-26 ENCOUNTER — Ambulatory Visit: Payer: Medicare HMO | Admitting: Neurology

## 2020-10-01 ENCOUNTER — Ambulatory Visit: Payer: Medicare HMO | Admitting: Neurology

## 2020-10-01 DIAGNOSIS — G43709 Chronic migraine without aura, not intractable, without status migrainosus: Secondary | ICD-10-CM

## 2020-10-01 NOTE — Progress Notes (Signed)
Botox- 100 units x 2 vials Lot: [id number] Expiration: 11/2022 NDC: 0932-6712-45  Bacteriostatic 0.9% Sodium Chloride- 75mL total Lot: [id number] Expiration: 08/06/2021 NDC: 3825-0539-76  Dx: B34.193 B/B

## 2020-10-01 NOTE — Progress Notes (Signed)
Interval history 10/01/2020: Patient with chronic intractable migraines. Still doing exceptionally well on Botox for Migraines, only botox and topiramate have ever helped. Prior tried multiple classes of medications which did not help with headaches or migraines until she started Botox. Since starting botox she has had  >95% improvement in frequency and severity, had to redo one botox and she did well again. The migraines she does have are sparse, and only recur when the botox is wearing off the last 2 weeks but still are easier to treat and >60% less severe.Nothing has worked in the past except botox and Topiramate.  No masseters or any other additional injections  Consent Form Botulism Toxin Injection For Chronic Migraine    Reviewed orally with patient, additionally signature is on file:  Botulism toxin has been approved by the Federal drug administration for treatment of chronic migraine. Botulism toxin does not cure chronic migraine and it may not be effective in some patients.  The administration of botulism toxin is accomplished by injecting a small amount of toxin into the muscles of the neck and head. Dosage must be titrated for each individual. Any benefits resulting from botulism toxin tend to wear off after 3 months with a repeat injection required if benefit is to be maintained. Injections are usually done every 3-4 months with maximum effect peak achieved by about 2 or 3 weeks. Botulism toxin is expensive and you should be sure of what costs you will incur resulting from the injection.  The side effects of botulism toxin use for chronic migraine may include:   -Transient, and usually mild, facial weakness with facial injections  -Transient, and usually mild, head or neck weakness with head/neck injections  -Reduction or loss of forehead facial animation due to forehead muscle weakness  -Eyelid drooping  -Dry eye  -Pain at the site of injection or bruising at the site of  injection  -Double vision  -Potential unknown long term risks  Contraindications: You should not have Botox if you are pregnant, nursing, allergic to albumin, have an infection, skin condition, or muscle weakness at the site of the injection, or have myasthenia gravis, Lambert-Eaton syndrome, or ALS.  It is also possible that as with any injection, there may be an allergic reaction or no effect from the medication. Reduced effectiveness after repeated injections is sometimes seen and rarely infection at the injection site may occur. All care will be taken to prevent these side effects. If therapy is given over a long time, atrophy and wasting in the muscle injected may occur. Occasionally the patient's become refractory to treatment because they develop antibodies to the toxin. In this event, therapy needs to be modified.  I have read the above information and consent to the administration of botulism toxin.    BOTOX PROCEDURE NOTE FOR MIGRAINE HEADACHE    Contraindications and precautions discussed with patient(above). Aseptic procedure was observed and patient tolerated procedure. Procedure performed by Dr. Georgia Dom  The condition has existed for more than 6 months, and pt does not have a diagnosis of ALS, Myasthenia Gravis or Lambert-Eaton Syndrome.  Risks and benefits of injections discussed and pt agrees to proceed with the procedure.  Written consent obtained  These injections are medically necessary. Pt  receives good benefits from these injections. These injections do not cause sedations or hallucinations which the oral therapies may cause.  Description of procedure:  The patient was placed in a sitting position. The standard protocol was used for Botox as follows, with  5 units of Botox injected at each site:   -Procerus muscle, midline injection  -Corrugator muscle, bilateral injection  -Frontalis muscle, bilateral injection, with 2 sites each side, medial injection was  performed in the upper one third of the frontalis muscle, in the region vertical from the medial inferior edge of the superior orbital rim. The lateral injection was again in the upper one third of the forehead vertically above the lateral limbus of the cornea, 1.5 cm lateral to the medial injection site.  -Temporalis muscle injection, 4 sites, bilaterally. The first injection was 3 cm above the tragus of the ear, second injection site was 1.5 cm to 3 cm up from the first injection site in line with the tragus of the ear. The third injection site was 1.5-3 cm forward between the first 2 injection sites. The fourth injection site was 1.5 cm posterior to the second injection site.   -Occipitalis muscle injection, 3 sites, bilaterally. The first injection was done one half way between the occipital protuberance and the tip of the mastoid process behind the ear. The second injection site was done lateral and superior to the first, 1 fingerbreadth from the first injection. The third injection site was 1 fingerbreadth superiorly and medially from the first injection site.  -Cervical paraspinal muscle injection, 2 sites, bilateral knee first injection site was 1 cm from the midline of the cervical spine, 3 cm inferior to the lower border of the occipital protuberance. The second injection site was 1.5 cm superiorly and laterally to the first injection site.  -Trapezius muscle injection was performed at 3 sites, bilaterally. The first injection site was in the upper trapezius muscle halfway between the inflection point of the neck, and the acromion. The second injection site was one half way between the acromion and the first injection site. The third injection was done between the first injection site and the inflection point of the neck.   Will return for repeat injection in 3 months.   200 units of Botox was used, any Botox not injected was wasted. The patient tolerated the procedure well, there were no  complications of the above procedure.

## 2020-10-01 NOTE — Addendum Note (Signed)
Addended by: Gildardo Griffes on: 10/01/2020 03:17 PM   Modules accepted: Orders

## 2020-10-07 ENCOUNTER — Other Ambulatory Visit: Payer: Self-pay | Admitting: Physician Assistant

## 2020-10-07 ENCOUNTER — Other Ambulatory Visit: Payer: Self-pay | Admitting: Internal Medicine

## 2020-10-07 DIAGNOSIS — E785 Hyperlipidemia, unspecified: Secondary | ICD-10-CM

## 2020-10-07 DIAGNOSIS — K219 Gastro-esophageal reflux disease without esophagitis: Secondary | ICD-10-CM

## 2020-10-07 DIAGNOSIS — E1169 Type 2 diabetes mellitus with other specified complication: Secondary | ICD-10-CM

## 2020-10-07 DIAGNOSIS — I10 Essential (primary) hypertension: Secondary | ICD-10-CM

## 2020-10-14 ENCOUNTER — Ambulatory Visit: Payer: Medicare HMO | Admitting: Endocrinology

## 2020-10-15 ENCOUNTER — Telehealth: Payer: Medicare HMO | Admitting: Internal Medicine

## 2020-10-21 ENCOUNTER — Ambulatory Visit: Payer: Medicare HMO | Admitting: Internal Medicine

## 2020-10-21 ENCOUNTER — Encounter: Payer: Self-pay | Admitting: Internal Medicine

## 2020-10-21 ENCOUNTER — Telehealth: Payer: Self-pay | Admitting: Internal Medicine

## 2020-10-21 ENCOUNTER — Other Ambulatory Visit: Payer: Self-pay

## 2020-10-21 VITALS — BP 128/78 | HR 86 | Temp 97.5°F | Ht 64.0 in | Wt 236.0 lb

## 2020-10-21 DIAGNOSIS — J9611 Chronic respiratory failure with hypoxia: Secondary | ICD-10-CM

## 2020-10-21 DIAGNOSIS — J4540 Moderate persistent asthma, uncomplicated: Secondary | ICD-10-CM | POA: Diagnosis not present

## 2020-10-21 DIAGNOSIS — U071 COVID-19: Secondary | ICD-10-CM

## 2020-10-21 MED ORDER — ADVAIR HFA 115-21 MCG/ACT IN AERO
2.0000 | INHALATION_SPRAY | Freq: Two times a day (BID) | RESPIRATORY_TRACT | 5 refills | Status: DC
Start: 2020-10-21 — End: 2021-01-02

## 2020-10-21 MED ORDER — ALBUTEROL SULFATE HFA 108 (90 BASE) MCG/ACT IN AERS
1.0000 | INHALATION_SPRAY | RESPIRATORY_TRACT | 5 refills | Status: DC | PRN
Start: 2020-10-21 — End: 2023-09-27

## 2020-10-21 NOTE — Patient Instructions (Addendum)
The patient should have follow up scheduled with myself in 3 months.   Prior to next visit patient should have: Full set of PFTs - 1 hour, appointment with me after  Continue advair for your asthma.  Take the albuterol rescue inhaler every 4 to 6 hours as needed for wheezing or shortness of breath. You can also take it 15 minutes before exercise or exertional activity. Side effects include heart racing or pounding, jitters or anxiety. If you have a history of an irregular heart rhythm, it can make this worse. Can also give some patients a hard time sleeping.  To inhale the aerosol using an inhaler, follow these steps:  1. Remove the protective dust cap from the end of the mouthpiece. If the dust cap was not placed on the mouthpiece, check the mouthpiece for dirt or other objects. Be sure that the canister is fully and firmly inserted in the mouthpiece. 2. If you are using the inhaler for the first time or if you have not used the inhaler in more than 14 days, you will need to prime it. You may also need to prime the inhaler if it has been dropped. Ask your pharmacist or check the manufacturer's information if this happens. To prime the inhaler, shake it well and then press down on the canister 4 times to release 4 sprays into the air, away from your face. Be careful not to get albuterol in your eyes. 3. Shake the inhaler well. 4. Breathe out as completely as possible through your mouth. 4. Hold the canister with the mouthpiece on the bottom, facing you and the canister pointing upward. Place the open end of the mouthpiece into your mouth. Close your lips tightly around the mouthpiece. 6. Breathe in slowly and deeply through the mouthpiece.At the same time, press down once on the container to spray the medication into your mouth. 7. Try to hold your breath for 10 seconds. remove the inhaler, and breathe out slowly. 8. If you were told to use 2 puffs, wait 1 minute and then repeat steps 3-7. 9.  Replace the protective cap on the inhaler. 10. Clean your inhaler regularly. Follow the manufacturer's directions carefully and ask your doctor or pharmacist if you have any questions about cleaning your inhaler.  Check the back of the inhaler to keep track of the total number of doses left on the inhaler.

## 2020-10-21 NOTE — Telephone Encounter (Signed)
Spoke to [place] with [facility], who is requesting for POC order to state with continuous with stationary and portable oxygen unit. Order has been corrected. Nothing further needed at this time.

## 2020-10-21 NOTE — Progress Notes (Signed)
Heidi [name]    [id number]    03/29/1951  Primary Care Physician:Johnson, Dalbert Batman, MD Date of Appointment: 10/21/2020 Established Patient Visit  Chief complaint:   Chief Complaint  Patient presents with  . Follow-up    Sob with exertion, productive cough with green mucus started 2 days ago, wheezing.      HPI: Nikesha Kwasny is a 70 y.o. woman with chronic hypercapnia and hypoxemia on BIPAP, asthma on advair, and covid 19 infection Feb 2022 with severe parenchymal lung injury on [facility] continuously. Start on oxygen August 2021.   Interval Updates: Former patient of Dr. Melvyn Novas, here to establish with me today.  Ran out of advair. Takes albuterol at least 2 times a day, more if with activity.  Sleep study has been ordered but hasn't been scheduled yet.  DME - [employer] Interested in portable oxygen concentrated.  Hasn't taken lasix today due to coming to this appointment.   Current Regimen: advair  Asthma Triggers: exercise, cold weather Exacerbations in the last year: 3, related to covid and URI.  History of hospitalization or intubation: yes, not intubation Allergy Testing: none GERD: denies Allergic Rhinitis: denies ACT:  Asthma Control Test ACT Total Score  10/21/2020 17  09/13/2020 14  02/28/2020 16   FeNO: 8 ppb   I have reviewed the patient's family social and past medical history and updated as appropriate.   Past Medical History:  Diagnosis Date  . Adenomatous colon polyp   . Anemia   . Anxiety   . Asthma   . CHF (congestive heart failure) ([place])   . COPD (chronic obstructive pulmonary disease) ([place])   . Diabetes mellitus without complication ([place])   . High cholesterol   . Hypertension   . Left knee injury   . Pneumonia     Past Surgical History:  Procedure Laterality Date  . ABDOMINAL HYSTERECTOMY    . CATARACT EXTRACTION Left   . CATARACT EXTRACTION     LT 01/2017, 02/2017 on RT  . CESAREAN SECTION      Family History  Problem  Relation Age of Onset  . Colon cancer Father   . Diabetes type II Sister   . Diabetes type II Brother   . Colon cancer Sister   . Migraines Neg Hx   . Breast cancer Neg Hx     Social History   Occupational History  . Occupation: Disabled  Tobacco Use  . Smoking status: Never Smoker  . Smokeless tobacco: Never Used  Vaping Use  . Vaping Use: Never used  Substance and Sexual Activity  . Alcohol use: No  . Drug use: No  . Sexual activity: Not on file     Physical Exam: Blood pressure 128/78, pulse 86, temperature (!) 97.5 F (36.4 C), height 5\' 4"  (1.626 m), weight 236 lb (107 kg), SpO2 92 %.  Gen:      No acute distress, obese ENT:   On oxygen Lungs:   Diminsihed, No increased respiratory effort, symmetric chest wall excursion, clear to auscultation bilaterally, no wheezes or crackles CV:         Regular rate and rhythm; no murmurs, rubs, or gallops.  Nonpitting le edema.   Data Reviewed: Imaging: I have personally reviewed the CT Chest Feb 2022 and it shows patchy multifocal airspace opacities consistent with covid 19 infection.   PFTs:  PFT Results Latest Ref Rng & Units 10/27/2017  FVC-Pre L 1.24  FVC-Predicted Pre %  15  FVC-Post L 1.14  FVC-Predicted Post % 45  Pre FEV1/FVC % % 86  Post FEV1/FCV % % 87  FEV1-Pre L 1.07  FEV1-Predicted Pre % 54  FEV1-Post L 1.00  DLCO uncorrected ml/min/mmHg 9.77  DLCO UNC% % 40  DLVA Predicted % 116  TLC L 3.04  TLC % Predicted % 60  RV % Predicted % 62   I have personally reviewed the patient's PFTs and they show mild restriction to ventilation with reduced diffusion capacity.   Labs: covid 19 positive jan 2022  Immunization status: Immunization History  Administered Date(s) Administered  . Influenza Whole 05/11/2014  . Influenza, High Dose Seasonal PF 04/08/2018, 02/25/2019  . Influenza,inj,Quad PF,6+ Mos 03/26/2015, 05/12/2016, 04/18/2017  . Pneumococcal Conjugate-13 01/14/2015  . Pneumococcal  Polysaccharide-23 12/19/2013, 11/13/2019  . Tdap 11/20/2016    Assessment:  Moderate Persistent Asthma, non TH2 mediated Chronic Hypoxemic Respiratory Failure COVID 19 infection Feb 2022, unvaccinated Suspected OHS/OSA Chronic HFpEF   Plan/Recommendations: Continue advair, refilled today Continue albuterol as needed PFTs at next visit, full set. Will need to evaluate if there has been worsening in lung function  Ambulated today to obtain POC.  Sleep study pending. She mentioned she has a lot of competing priorities right now in her health. Will continue to revisit.   I spent 30 minutes on 10/21/2020 in care of this patient including face to face time and non-face to face time spent charting, review of outside records, and coordination of care.   Return to Care: Return in about 3 months (around 01/20/2021).   [name], MD [facility]

## 2020-10-23 DIAGNOSIS — J45909 Unspecified asthma, uncomplicated: Secondary | ICD-10-CM | POA: Diagnosis not present

## 2020-10-23 DIAGNOSIS — I10 Essential (primary) hypertension: Secondary | ICD-10-CM | POA: Diagnosis not present

## 2020-10-29 ENCOUNTER — Other Ambulatory Visit: Payer: Self-pay | Admitting: Internal Medicine

## 2020-10-29 DIAGNOSIS — M4302 Spondylolysis, cervical region: Secondary | ICD-10-CM

## 2020-10-29 NOTE — Telephone Encounter (Signed)
Medication Refill - Medication: traMADol (ULTRAM) 50 MG tablet Pt is completely out of her current supply   Has the patient contacted their pharmacy? No. No more refills  (Agent: If no, request that the patient contact the pharmacy for the refill.) (Agent: If yes, when and what did the pharmacy advise?)  Preferred Pharmacy (with phone number or street name):  [facility] [place], [address]  [place] [address]  Phone: [phone] Fax: [phone]     Agent: Please be advised that RX refills may take up to 3 business days. We ask that you follow-up with your pharmacy.

## 2020-10-29 NOTE — Telephone Encounter (Signed)
Requested medication (s) are due for refill today: yes  Requested medication (s) are on the active medication list: yes   Last refill: 09/12/2020  Future visit scheduled: no  Notes to clinic:  this refill cannot be delegated  Patient is completely out    Requested Prescriptions  Pending Prescriptions Disp Refills   traMADol (ULTRAM) 50 MG tablet 40 tablet 0    Sig: Take 1 tablet (50 mg total) by mouth every 8 (eight) hours as needed (Prescription must last 1 month).      There is no refill protocol information for this order

## 2020-10-30 DIAGNOSIS — E261 Secondary hyperaldosteronism: Secondary | ICD-10-CM | POA: Diagnosis not present

## 2020-10-30 DIAGNOSIS — E785 Hyperlipidemia, unspecified: Secondary | ICD-10-CM | POA: Diagnosis not present

## 2020-10-30 DIAGNOSIS — I509 Heart failure, unspecified: Secondary | ICD-10-CM | POA: Diagnosis not present

## 2020-10-30 DIAGNOSIS — I25119 Atherosclerotic heart disease of native coronary artery with unspecified angina pectoris: Secondary | ICD-10-CM | POA: Diagnosis not present

## 2020-10-30 DIAGNOSIS — Z794 Long term (current) use of insulin: Secondary | ICD-10-CM | POA: Diagnosis not present

## 2020-10-30 DIAGNOSIS — Z6841 Body Mass Index (BMI) 40.0 and over, adult: Secondary | ICD-10-CM | POA: Diagnosis not present

## 2020-10-30 DIAGNOSIS — E1122 Type 2 diabetes mellitus with diabetic chronic kidney disease: Secondary | ICD-10-CM | POA: Diagnosis not present

## 2020-10-30 DIAGNOSIS — I130 Hypertensive heart and chronic kidney disease with heart failure and stage 1 through stage 4 chronic kidney disease, or unspecified chronic kidney disease: Secondary | ICD-10-CM | POA: Diagnosis not present

## 2020-10-30 DIAGNOSIS — J449 Chronic obstructive pulmonary disease, unspecified: Secondary | ICD-10-CM | POA: Diagnosis not present

## 2020-10-31 ENCOUNTER — Telehealth: Payer: Self-pay | Admitting: Internal Medicine

## 2020-10-31 MED ORDER — TRAMADOL HCL 50 MG PO TABS: 50.0000 mg | ORAL_TABLET | Freq: Three times a day (TID) | ORAL | 0 refills | Status: DC | PRN

## 2020-10-31 NOTE — Telephone Encounter (Signed)
Patient called to follow on a call she was suppose to get to schedule a hospital colonoscopy. Please call patient no later then today per patients request.

## 2020-11-01 ENCOUNTER — Ambulatory Visit: Payer: Medicare HMO | Admitting: Internal Medicine

## 2020-11-08 NOTE — Telephone Encounter (Signed)
Lm on vm 

## 2020-11-11 ENCOUNTER — Other Ambulatory Visit: Payer: Self-pay | Admitting: Internal Medicine

## 2020-11-11 DIAGNOSIS — IMO0002 Reserved for concepts with insufficient information to code with codable children: Secondary | ICD-10-CM

## 2020-11-11 DIAGNOSIS — E1165 Type 2 diabetes mellitus with hyperglycemia: Secondary | ICD-10-CM

## 2020-11-12 ENCOUNTER — Other Ambulatory Visit: Payer: Self-pay | Admitting: Internal Medicine

## 2020-11-12 DIAGNOSIS — E1165 Type 2 diabetes mellitus with hyperglycemia: Secondary | ICD-10-CM

## 2020-11-12 DIAGNOSIS — IMO0002 Reserved for concepts with insufficient information to code with codable children: Secondary | ICD-10-CM

## 2020-11-12 NOTE — Telephone Encounter (Signed)
Requested Prescriptions  Pending Prescriptions Disp Refills  . INSULIN SYRINGE 1CC/29G 29G X 1/2" 1 ML MISC [Pharmacy Med Name: [facility] SYR/NDL 29G 1ML U/F] 100 each 3    Sig: USE THREE TIMES DAILY AS DIRECTED     There is no refill protocol information for this order

## 2020-11-19 NOTE — Telephone Encounter (Signed)
Lm on vm 

## 2020-11-22 DIAGNOSIS — I10 Essential (primary) hypertension: Secondary | ICD-10-CM | POA: Diagnosis not present

## 2020-11-22 DIAGNOSIS — J45909 Unspecified asthma, uncomplicated: Secondary | ICD-10-CM | POA: Diagnosis not present

## 2020-11-28 ENCOUNTER — Other Ambulatory Visit: Payer: Self-pay

## 2020-11-28 ENCOUNTER — Telehealth: Payer: Self-pay | Admitting: Neurology

## 2020-11-28 ENCOUNTER — Telehealth: Payer: Self-pay

## 2020-11-28 DIAGNOSIS — Z86010 Personal history of colonic polyps: Secondary | ICD-10-CM

## 2020-11-28 NOTE — Telephone Encounter (Signed)
Patient called and left me a message stating that she needs an appointment with Dr. Jaynee Eagles before her next Botox appointment, but did not tell me the reason why. Can you please reach out to patient to discuss?

## 2020-11-28 NOTE — Telephone Encounter (Signed)
Spoke with patient and scheduled her for a colon at [facility] on 12/10/2020 at 9:15am and a previsit on 12/04/2020.  Patient acknowledged and understood

## 2020-12-03 ENCOUNTER — Other Ambulatory Visit: Payer: Self-pay

## 2020-12-03 ENCOUNTER — Encounter (HOSPITAL_COMMUNITY): Payer: Self-pay | Admitting: Internal Medicine

## 2020-12-03 NOTE — Telephone Encounter (Addendum)
I spoke with patient.  She stated she has to go see her attorney and she needed to discuss a few things with Dr. Jaynee Eagles first.  She stated the migraines were a result of an accident that she had.  She states her Botox only covers a certain portion of her shots. This is ongoing. She does not have Medicaid.  Patient also saw something on TV about a condition where a body part moves randomly, may be a result of medication? She would like to discuss all this with Dr. Jaynee Eagles.  She states this will help her in her court case.

## 2020-12-04 NOTE — Telephone Encounter (Signed)
I can see her but first available, she may have to wait a few months.

## 2020-12-04 NOTE — Telephone Encounter (Signed)
Spoke with patient and scheduled her for an appointment with Dr. Jaynee Eagles on Monday, August 15 at 1:00 PM.  I did let her know per Dr. Jaynee Eagles that she does not get involved in court cases.  Patient verbalized understanding.  We also scheduled her for her next Botox appointment on Thursday, June 30 at 4:00 PM.  Patient verbalized appreciation for the call.

## 2020-12-05 ENCOUNTER — Telehealth: Payer: Self-pay | Admitting: *Deleted

## 2020-12-05 ENCOUNTER — Telehealth: Payer: Self-pay | Admitting: Internal Medicine

## 2020-12-05 NOTE — Telephone Encounter (Signed)
Copied from [place] [id number]. Topic: General - Other >> Dec 04, 2020  3:12 PM Celene Kras wrote: Reason for CRM: Colletta Maryland, from [facility], calling and is requesting to have a follow up on order stating that the pt would like to be discharged from services due to the pts request. She states that this was first faxed over on 10/03/20 and 10/24/20. She states that she will fax over again today. Please advise.

## 2020-12-05 NOTE — Telephone Encounter (Addendum)
Dr. Henrene Pastor and Vaughan Basta,  I just wanted to let you know that I am cancelling this pt's hospital case for 12-10-20.  She was not able to come to her PV yesterday so I rescheduled her for today.  I could not reach her after several attempts and she did not call back before 5 to reschedule her PV.  I will send her a no show letter in the mail.  I cannot cancel it myself, so Vaughan Basta if you could cancel it I would appreciate it.    Thanks, [employer]

## 2020-12-06 ENCOUNTER — Telehealth: Payer: Self-pay | Admitting: Internal Medicine

## 2020-12-06 NOTE — Telephone Encounter (Signed)
Case has been cancelled with endo.

## 2020-12-06 NOTE — Telephone Encounter (Signed)
Inbound call from pt stating that she needed to cxl her procedure on 12/10/20 at [facility] and needed to resch. Please give her a call. Thank you

## 2020-12-06 NOTE — Telephone Encounter (Signed)
Returned [place] call to give okay for discharge was unable to lvm

## 2020-12-06 NOTE — Telephone Encounter (Signed)
See alternate phone note 6/3

## 2020-12-06 NOTE — Telephone Encounter (Signed)
Sheri, I am not able to cancel hospital procedures.  Could you cancel this pt's procedure please?  Thanks, [employer]

## 2020-12-10 ENCOUNTER — Ambulatory Visit (HOSPITAL_COMMUNITY): Admission: RE | Admit: 2020-12-10 | Payer: Medicare HMO | Source: Home / Self Care | Admitting: Internal Medicine

## 2020-12-10 HISTORY — DX: Dependence on supplemental oxygen: Z99.81

## 2020-12-10 SURGERY — COLONOSCOPY WITH PROPOFOL
Anesthesia: Monitor Anesthesia Care

## 2020-12-11 ENCOUNTER — Other Ambulatory Visit: Payer: Self-pay | Admitting: Internal Medicine

## 2020-12-11 DIAGNOSIS — K219 Gastro-esophageal reflux disease without esophagitis: Secondary | ICD-10-CM

## 2020-12-11 DIAGNOSIS — E1169 Type 2 diabetes mellitus with other specified complication: Secondary | ICD-10-CM

## 2020-12-11 NOTE — Telephone Encounter (Signed)
Requested Prescriptions  Pending Prescriptions Disp Refills  . pantoprazole (PROTONIX) 40 MG tablet [Pharmacy Med Name: PANTOPRAZOLE 40MG  TABLETS] 90 tablet 0    Sig: TAKE 1 TABLET(40 MG) BY MOUTH DAILY     Gastroenterology: Proton Pump Inhibitors Passed - 12/11/2020  6:24 AM      Passed - Valid encounter within last 12 months    Recent Outpatient Visits          3 months ago Hospital discharge follow-up   [place] Karle Plumber B, MD   7 months ago Type 2 diabetes mellitus with stage 2 chronic kidney disease, unspecified whether long term insulin use ([place])   [place], Neoma Laming B, MD   12 months ago Type 2 diabetes mellitus with stage 2 chronic kidney disease, unspecified whether long term insulin use [facility])   [place], [place], [place]   1 year ago Need for vaccination against Streptococcus pneumoniae   [place], RPH-CPP   1 year ago 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           . atorvastatin (LIPITOR) 40 MG tablet [Pharmacy Med Name: ATORVASTATIN 40MG  TABLETS] 90 tablet 0    Sig: TAKE 1 TABLET BY MOUTH DAILY AT 6 PM     Cardiovascular:  Antilipid - Statins Passed - 12/11/2020  6:24 AM      Passed - Total Cholesterol in normal range and within 360 days    Cholesterol, Total  Date Value Ref Range Status  05/14/2020 138 100 - 199 mg/dL Final         Passed - LDL in normal range and within 360 days    LDL Chol Calc (NIH)  Date Value Ref Range Status  05/14/2020 54 0 - 99 mg/dL Final         Passed -  HDL in normal range and within 360 days    HDL  Date Value Ref Range Status  05/14/2020 57 >39 mg/dL Final         Passed - Triglycerides in normal range and within 360 days    Triglycerides  Date Value Ref Range Status  08/10/2020 141 <150 mg/dL Final    Comment:    Performed at [facility], [place] [address], [place], [place] [address]         Passed - Patient is not pregnant      Passed - Valid encounter within last 12 months    Recent Outpatient Visits          3 months ago Hospital discharge follow-up   [place] Karle Plumber B, MD   7 months ago Type 2 diabetes mellitus with stage 2 chronic kidney disease, unspecified whether long term insulin use ([place])   [place], MD   12 months ago Type 2 diabetes mellitus with stage 2 chronic kidney disease, unspecified whether long term insulin use [facility])   [place], [place], [place]  1 year ago Need for vaccination against Streptococcus pneumoniae   [place], RPH-CPP   1 year ago 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

## 2020-12-12 ENCOUNTER — Telehealth: Payer: Self-pay | Admitting: Internal Medicine

## 2020-12-12 DIAGNOSIS — M4302 Spondylolysis, cervical region: Secondary | ICD-10-CM

## 2020-12-12 MED ORDER — TRAMADOL HCL 50 MG PO TABS: 50.0000 mg | ORAL_TABLET | Freq: Three times a day (TID) | ORAL | 0 refills | Status: DC | PRN

## 2020-12-12 NOTE — Telephone Encounter (Signed)
Will forward to [place]  

## 2020-12-12 NOTE — Telephone Encounter (Signed)
Colletta Maryland is calling back and would like callback concerning discharge

## 2020-12-12 NOTE — Telephone Encounter (Signed)
Reached out to the scheduling department to see if they can reach out to pt to get her rescheduled. Forgot who I spoke to but I did speak with a lady and she will reach out to pt to rescheduled. Contacted pt to make aware that the scheduling department will be reaching out to her and per pt they have already reached out and per pt she told them to call her tomorrow so that she can look at her calender.

## 2020-12-12 NOTE — Telephone Encounter (Signed)
Copied from [place] [phone]. Topic: General - Other >> Dec 10, 2020  5:34 PM Loma Boston wrote: Pt is requesting refill last appt 2/25 traMADol (ULTRAM) 50 MG tablet Medication Date: 10/31/2020 Department: [place] Ordering/Authorizing: Ladell Pier, MD   Prescribing Provider Encounter Provider Ladell Pier, MD Ladell Pier, MDOutpatient Medication Detail  Disp Refills Start End  traMADol (ULTRAM) 50 MG tablet 40 tablet 0 10/31/2020   Sig - Route: Take 1 tablet (50 mg total) by mouth every 8 (eight) hours as needed (Prescription must last [place], [place]

## 2020-12-12 NOTE — Telephone Encounter (Signed)
Copied from [place] [phone]. Topic: Referral - Status >> Dec 11, 2020  9:41 AM Yvette Rack wrote: Reason for CRM: Pt stated she would like a referral to a pain management clinic that is closer to her. Pt stated the last referral she received the location was too far away

## 2020-12-12 NOTE — Telephone Encounter (Signed)
Copied from [place] [phone]. Topic: Referral - Question >> Dec 10, 2020  5:42 PM Loma [place] wrote: Pt states Dr Lenna Sciara scheduled her appt for thyroid procedure on 09/05/20 and she had a lot going on w/th her daughter...states would like Dr Lenna Sciara to resch at [facility] for her as still having problems. Fu with pt [phone]

## 2020-12-12 NOTE — Telephone Encounter (Signed)
Will forward to provider  

## 2020-12-12 NOTE — Telephone Encounter (Signed)
Returned [place] call and lvm

## 2020-12-13 NOTE — Telephone Encounter (Signed)
Noted   Sent Referral to  Physical Medicine and Rehabilitation

## 2020-12-14 ENCOUNTER — Other Ambulatory Visit: Payer: Self-pay | Admitting: Internal Medicine

## 2020-12-14 DIAGNOSIS — Z794 Long term (current) use of insulin: Secondary | ICD-10-CM

## 2020-12-14 NOTE — Telephone Encounter (Signed)
These prescriptions were ordered at hospital discharge by Dr. Phillips Climes

## 2020-12-15 ENCOUNTER — Other Ambulatory Visit: Payer: Self-pay | Admitting: Internal Medicine

## 2020-12-15 DIAGNOSIS — Z794 Long term (current) use of insulin: Secondary | ICD-10-CM

## 2020-12-15 NOTE — Telephone Encounter (Signed)
last RF 08/19/20 10 ml 11 RF prescribed at dc'd Dr [facility]

## 2020-12-18 ENCOUNTER — Encounter: Payer: Self-pay | Admitting: Physical Medicine & Rehabilitation

## 2020-12-23 DIAGNOSIS — I10 Essential (primary) hypertension: Secondary | ICD-10-CM | POA: Diagnosis not present

## 2020-12-23 DIAGNOSIS — J45909 Unspecified asthma, uncomplicated: Secondary | ICD-10-CM | POA: Diagnosis not present

## 2020-12-24 ENCOUNTER — Ambulatory Visit: Payer: Medicare HMO | Admitting: Endocrinology

## 2021-01-01 NOTE — Progress Notes (Signed)
Patient referred by Ladell Pier, MD for chest pain  Subjective:   Heidi [name], female    DOB: 1951/03/07, 70 y.o.   MRN: [id number]   Chief Complaint  Patient presents with   Hypertension   Shortness of Breath   Follow-up    HPI  70 y.o. African American female with hypertension, type 2 DM with retinopathy, hyperlipidemia, CKD stage III, OSA, obesity, breast nodule, with shortness of breath.  Breathing has improved. She denies chest pain. She reports that her heart rate increases on walking, but comes down after resting. Diabtes remains uncontrolled.   Patient was hospitalized in May 2021 with complaints of shortness of breath.  Work-up with CT chest was concerning for bilateral hazy, groundglass opacities concerning for atypical infectious process versus viral pneumonia.  There was no PE.  She was treated for presumed acute diastolic heart failure, with IV Lasix.  There was concern regarding patient's refusal of certain meds during the hospitalization.  She was also treated with IV Rocephin and Zithromax for possible multifocal pneumonia with a 5-day course.  She was discharged home on supplemental oxygen, which is baseline for her.  Patient underwent work-up for ACS given her chest pain.  ACS was excluded.  Subsequent stress test did not show any ischemia.  Patient reportedly underwent cardiac catheterization at [facility] in [place] in 2014, which showed normal coronaries and elevated LVEDP at 20 mmHg. She had a CT scan of her chest in twenty sixteen that showed scattered coronary artery calcifications.  Echocardiogram in February 2021 showed normal EF, indeterminate diastolic function, mild right ventricular enlargement, no significant valvular abnormality.  There was inadequate tricuspid regurgitation signal to assess PA pressure.    Current Outpatient Medications on File Prior to Visit  Medication Sig Dispense Refill   Accu-Chek FastClix Lancets MISC Use as  directed to test blood sugar three times daily DX E11.65 102 each 6   albuterol (PROVENTIL) (2.5 MG/3ML) 0.083% nebulizer solution USE 1 VIAL VIA NEBULIZER EVERY 6 HOURS AS NEEDED FOR WHEEZING OR SHORTNESS OF BREATH (Patient taking differently: Take 2.5 mg by nebulization every 6 (six) hours as needed for wheezing or shortness of breath.) 25 vial 0   albuterol (VENTOLIN HFA) 108 (90 Base) MCG/ACT inhaler Inhale 1-2 puffs into the lungs every 4 (four) hours as needed for wheezing or shortness of breath. 8 g 5   aspirin 81 MG chewable tablet Chew 81 mg by mouth daily.      atenolol (TENORMIN) 25 MG tablet TAKE 1 TABLET(25 MG) BY MOUTH DAILY 90 tablet 1   atorvastatin (LIPITOR) 40 MG tablet TAKE 1 TABLET BY MOUTH DAILY AT 6 PM 90 tablet 0   Blood Glucose Monitoring Suppl (ACCU-CHEK GUIDE ME) w/Device KIT Use as directed to test blood sugar three times daily DX E11.65 1 kit 0   famotidine (PEPCID) 20 MG tablet Take 1 tablet (20 mg total) by mouth 2 (two) times daily. 60 tablet 4   fluticasone-salmeterol (ADVAIR HFA) 115-21 MCG/ACT inhaler Inhale 2 puffs into the lungs 2 (two) times daily. 1 each 5   furosemide (LASIX) 40 MG tablet Take 1 tablet (40 mg total) by mouth daily. 30 tablet 3   glucose blood (ACCU-CHEK GUIDE) test strip Use as directed to test blood sugar three times daily DX E11.65 100 each 6   insulin aspart protamine- aspart (NOVOLOG MIX 70/30) (70-30) 100 UNIT/ML injection Inject 0.4 mLs (40 Units total) into the skin 2 (two) times daily with  a meal. Inject 70 units in the morning and 45 units in the evening. 10 mL 11   INSULIN SYRINGE 1CC/29G 29G X 1/2" 1 ML MISC USE THREE TIMES DAILY AS DIRECTED 100 each 3   loratadine (CLARITIN) 10 MG tablet Take 10 mg by mouth daily as needed for allergies.     losartan (COZAAR) 50 MG tablet TAKE 1 TABLET(50 MG) BY MOUTH DAILY 90 tablet 3   montelukast (SINGULAIR) 10 MG tablet Take 1 tablet (10 mg total) by mouth at bedtime. 90 tablet 2   Multiple  Vitamin (MULTIVITAMIN WITH MINERALS) TABS tablet Take 1 tablet by mouth daily.     nitroGLYCERIN (NITROSTAT) 0.3 MG SL tablet Place 1 tablet (0.3 mg total) under the tongue every 5 (five) minutes as needed for chest pain. Max 3 tablets in 15 minutes 30 tablet 1   olopatadine (PATANOL) 0.1 % ophthalmic solution Place 1 drop into both eyes 2 (two) times daily. (Patient taking differently: Place 1 drop into both eyes 2 (two) times daily as needed for allergies.) 5 mL 0   pantoprazole (PROTONIX) 40 MG tablet TAKE 1 TABLET(40 MG) BY MOUTH DAILY 90 tablet 0   Potassium Chloride ER 20 MEQ TBCR TAKE 1 TABLET BY MOUTH DAILY (Patient taking differently: Take 20 mEq by mouth daily.) 90 tablet 3   tetrahydrozoline-zinc (VISINE-AC) 0.05-0.25 % ophthalmic solution Place 1 drop into both eyes 3 (three) times daily as needed.     topiramate (TOPAMAX) 25 MG capsule TAKE 3 CAPSULES(75 MG) BY MOUTH TWICE DAILY (Patient taking differently: Take 75 mg by mouth 2 (two) times daily.) 270 capsule 0   traMADol (ULTRAM) 50 MG tablet Take 1 tablet (50 mg total) by mouth every 8 (eight) hours as needed (Prescription must last 1 month). 40 tablet 0   [DISCONTINUED] Fexofenadine HCl (MUCINEX ALLERGY PO) Take 1 tablet by mouth daily as needed (for allergy).     No current facility-administered medications on file prior to visit.    Cardiovascular and other pertinent studies:  EKG 01/02/2021: Sinus rhythm 85 bpm  Poor R-wave progression Cannot exclude old anteroseptal infarct Low voltage -possible pulmonary disease  Lexiscan Tetrofosmin stress test 12/09/2019: Lexiscan nuclear stress test performed using 1-day protocol. Stress EKG is non-diagnostic, as this is pharmacological stress test using Lexiscan. Myocardial perfusion imaging is normal. Stress LVEF 74% Low risk study.    CT Chest 11/24/2019: 1. No evidence for pulmonary embolus. 2. Cardiomegaly with hazy bilateral ground-glass airspace opacities primarily at the  lung bases, suggestive of an atypical infectious process such as viral pneumonia. 3. Scattered bilateral breast nodules measuring up to approximately 8 mm on the left. Correlation with outpatient mammography is recommended. 4. Partially calcified 1.5 cm left-sided thyroid nodule. Follow-up with a nonemergent outpatient thyroid ultrasound is recommended for further evaluation if this has not already been performed.(Ref: J Am Coll Radiol. 2015 Feb;12(2): 143-50).  Echocardiogram 08/25/2019: 1. Left ventricular ejection fraction, by estimation, is 65 to 70%. The left ventricle has normal function.  LV endocardial border not optimally defined to evaluate regional wall motion. There is mild concentric left ventricular hypertrophy.  Indeterminate diastolic filling due to E-A fusion.  2. Right ventricular systolic function is normal. The right ventricular size is mildly enlarged.  Tricuspid regurgitation signal is inadequate for assessing PA pressure.  3. The mitral valve is grossly normal. No evidence of mitral valve regurgitation. No evidence of mitral stenosis.  4. The aortic valve is grossly normal. Aortic valve regurgitation is not visualized.  No aortic stenosis is present.   CT Chest 04/09/2015: 1. Mild airspace opacity within the left lower lung lobe may reflect atelectasis or possibly mild residual pneumonia.  Minimal atelectasis at the medial aspect of the right middle lobe. Lungs otherwiseclear. 2. Scattered coronary artery calcification seen. 3. Left renal cyst, with mild associated peripheral calcification.  Cardiac catheterization 2014 [facility], [place]): 1. Angiographically normal coronaries 2. LVEDP 20 mm Hg (mildly increased)   Recent labs: 08/30/2020: Glucose 58, BUN/Cr 11/1.16. EGFR 56. Na/K 147/4.2. ALT 35. Rest of the CMP normal H/H 13/41. MCV 88. Platelets 128 HbA1C 9.9% Chol 138, TG 164, HDL 57, LDL 54 TSH N/A  08/25/2019-11/13/2019: Glucose 82, BUN/Cr  14/1.14. EGFR 57. Na/K 146/4.6. Alkaline Phospatase 122. H/H 14/46.3. MCV 88.5. Platelets 185. HbA1C 10.5% TSH 1.977 normal BNP 23.  HS Trop 3 X2  06/14/2019: Chol 118, TG 148, HDL 56, LDL 37     Review of Systems  Cardiovascular:  Positive for dyspnea on exertion (Improved). Negative for chest pain, leg swelling, palpitations and syncope.       Vitals:   01/02/21 0849  BP: (!) 142/82  Pulse: 86  Resp: 16  Temp: 97.6 F (36.4 C)  SpO2: 94%    Body mass index is 39.48 kg/m. Filed Weights   01/02/21 0849  Weight: 230 lb (104.3 kg)     Objective:   Physical Exam Vitals and nursing note reviewed.  Constitutional:      General: She is not in acute distress. Neck:     Vascular: No JVD.  Cardiovascular:     Rate and Rhythm: Normal rate and regular rhythm.     Heart sounds: Normal heart sounds. No murmur heard. Pulmonary:     Effort: Pulmonary effort is normal.     Breath sounds: Normal breath sounds. No wheezing or rales.  Musculoskeletal:     Right lower leg: No edema.     Left lower leg: No edema.        Assessment & Recommendations:   70 y.o. African American female with hypertension, uncontrolled type 2 DM with retinopathy, hyperlipidemia, CKD stage III, OSA, obesity, dyspnea on exertion  Dyspnea on exertion: Improved  I reassured her that increase in heart rate with walking, and improvement with rest is physiological.  Hypertension: Controlled  Type 2 DM: Remains uncontrolled. I have educated the patient that this is her bigeest CAD risk factor. She is working with PCP on this.  F/u in 1 year   Nigel Mormon, MD [facility] Pager: [phone] Office: [phone]

## 2021-01-02 ENCOUNTER — Other Ambulatory Visit: Payer: Self-pay

## 2021-01-02 ENCOUNTER — Ambulatory Visit: Payer: Medicare HMO | Admitting: Neurology

## 2021-01-02 ENCOUNTER — Encounter: Payer: Self-pay | Admitting: Cardiology

## 2021-01-02 ENCOUNTER — Ambulatory Visit: Payer: Self-pay | Admitting: Neurology

## 2021-01-02 ENCOUNTER — Ambulatory Visit: Payer: Medicare HMO | Admitting: Cardiology

## 2021-01-02 VITALS — BP 142/82 | HR 86 | Temp 97.6°F | Resp 16 | Ht 64.0 in | Wt 230.0 lb

## 2021-01-02 DIAGNOSIS — E1165 Type 2 diabetes mellitus with hyperglycemia: Secondary | ICD-10-CM

## 2021-01-02 DIAGNOSIS — I10 Essential (primary) hypertension: Secondary | ICD-10-CM | POA: Diagnosis not present

## 2021-01-02 DIAGNOSIS — G43709 Chronic migraine without aura, not intractable, without status migrainosus: Secondary | ICD-10-CM

## 2021-01-02 DIAGNOSIS — R0609 Other forms of dyspnea: Secondary | ICD-10-CM | POA: Diagnosis not present

## 2021-01-02 NOTE — Progress Notes (Signed)
Botox- 200 units x 1 vial Lot: [id number] Expiration: 07/2023 NDC: 7035-0093-81  Bacteriostatic 0.9% Sodium Chloride- 79mL total Lot: [id number] Expiration: 07/06/2022 NDC: 1696-7893-81  Dx: O17.510 B/B

## 2021-01-07 NOTE — Progress Notes (Signed)
Interval history 01/02/2021 STABLE: Patient with chronic intractable migraines. Still doing exceptionally well on Botox for Migraines, only botox and topiramate have ever helped. Prior tried multiple classes of medications which did not help with headaches or migraines until she started Botox. Since starting botox she has had  >95% improvement in frequency and severity, had to redo one botox and she did well again. The migraines she does have are sparse, and only recur when the botox is wearing off the last 2 weeks but still are easier to treat and >60% less severe.Nothing has worked in the past except botox and Topiramate.  No masseters or any other additional injections  Consent Form Botulism Toxin Injection For Chronic Migraine    Reviewed orally with patient, additionally signature is on file:  Botulism toxin has been approved by the Federal drug administration for treatment of chronic migraine. Botulism toxin does not cure chronic migraine and it may not be effective in some patients.  The administration of botulism toxin is accomplished by injecting a small amount of toxin into the muscles of the neck and head. Dosage must be titrated for each individual. Any benefits resulting from botulism toxin tend to wear off after 3 months with a repeat injection required if benefit is to be maintained. Injections are usually done every 3-4 months with maximum effect peak achieved by about 2 or 3 weeks. Botulism toxin is expensive and you should be sure of what costs you will incur resulting from the injection.  The side effects of botulism toxin use for chronic migraine may include:   -Transient, and usually mild, facial weakness with facial injections  -Transient, and usually mild, head or neck weakness with head/neck injections  -Reduction or loss of forehead facial animation due to forehead muscle weakness  -Eyelid drooping  -Dry eye  -Pain at the site of injection or bruising at the site of  injection  -Double vision  -Potential unknown long term risks  Contraindications: You should not have Botox if you are pregnant, nursing, allergic to albumin, have an infection, skin condition, or muscle weakness at the site of the injection, or have myasthenia gravis, Lambert-Eaton syndrome, or ALS.  It is also possible that as with any injection, there may be an allergic reaction or no effect from the medication. Reduced effectiveness after repeated injections is sometimes seen and rarely infection at the injection site may occur. All care will be taken to prevent these side effects. If therapy is given over a long time, atrophy and wasting in the muscle injected may occur. Occasionally the patient's become refractory to treatment because they develop antibodies to the toxin. In this event, therapy needs to be modified.  I have read the above information and consent to the administration of botulism toxin.    BOTOX PROCEDURE NOTE FOR MIGRAINE HEADACHE    Contraindications and precautions discussed with patient(above). Aseptic procedure was observed and patient tolerated procedure. Procedure performed by Dr. Georgia Dom  The condition has existed for more than 6 months, and pt does not have a diagnosis of ALS, Myasthenia Gravis or Lambert-Eaton Syndrome.  Risks and benefits of injections discussed and pt agrees to proceed with the procedure.  Written consent obtained  These injections are medically necessary. Pt  receives good benefits from these injections. These injections do not cause sedations or hallucinations which the oral therapies may cause.  Description of procedure:  The patient was placed in a sitting position. The standard protocol was used for Botox as follows,  with 5 units of Botox injected at each site:   -Procerus muscle, midline injection  -Corrugator muscle, bilateral injection  -Frontalis muscle, bilateral injection, with 2 sites each side, medial injection was  performed in the upper one third of the frontalis muscle, in the region vertical from the medial inferior edge of the superior orbital rim. The lateral injection was again in the upper one third of the forehead vertically above the lateral limbus of the cornea, 1.5 cm lateral to the medial injection site.  -Temporalis muscle injection, 4 sites, bilaterally. The first injection was 3 cm above the tragus of the ear, second injection site was 1.5 cm to 3 cm up from the first injection site in line with the tragus of the ear. The third injection site was 1.5-3 cm forward between the first 2 injection sites. The fourth injection site was 1.5 cm posterior to the second injection site.   -Occipitalis muscle injection, 3 sites, bilaterally. The first injection was done one half way between the occipital protuberance and the tip of the mastoid process behind the ear. The second injection site was done lateral and superior to the first, 1 fingerbreadth from the first injection. The third injection site was 1 fingerbreadth superiorly and medially from the first injection site.  -Cervical paraspinal muscle injection, 2 sites, bilateral knee first injection site was 1 cm from the midline of the cervical spine, 3 cm inferior to the lower border of the occipital protuberance. The second injection site was 1.5 cm superiorly and laterally to the first injection site.  -Trapezius muscle injection was performed at 3 sites, bilaterally. The first injection site was in the upper trapezius muscle halfway between the inflection point of the neck, and the acromion. The second injection site was one half way between the acromion and the first injection site. The third injection was done between the first injection site and the inflection point of the neck.   Will return for repeat injection in 3 months.   200 units of Botox was used, any Botox not injected was wasted. The patient tolerated the procedure well, there were no  complications of the above procedure.

## 2021-01-08 ENCOUNTER — Telehealth: Payer: Self-pay | Admitting: Internal Medicine

## 2021-01-08 DIAGNOSIS — E11319 Type 2 diabetes mellitus with unspecified diabetic retinopathy without macular edema: Secondary | ICD-10-CM

## 2021-01-08 NOTE — Telephone Encounter (Signed)
Notes to clinic:  medications filled by different provider Review for 90 day refills   Requested Prescriptions  Pending Prescriptions Disp Refills   NOVOLOG MIX 70/30 (70-30) 100 UNIT/ML injection [Pharmacy Med Name: NOVOLOG MIX 70/30 SUS VL10ML(BLUE)] 40 mL     Sig: INJECT 80 UNITS UNDER THE SKIN IN THE MORNING AND 50 UNITS IN [place]      Endocrinology:  Diabetes - Insulins Failed - 01/08/2021 11:04 AM      Failed - HBA1C is between 0 and 7.9 and within 180 days    HbA1c, POC (controlled diabetic range)  Date Value Ref Range Status  08/30/2020 9.9 (A) 0.0 - 7.0 % Final          Passed - Valid encounter within last 6 months    Recent Outpatient Visits           4 months ago Hospital discharge follow-up   [place], Neoma Laming B, MD   7 months ago Type 2 diabetes mellitus with stage 2 chronic kidney disease, unspecified whether long term insulin use ([place])   [place], Neoma Laming B, MD   1 year ago Type 2 diabetes mellitus with stage 2 chronic kidney disease, unspecified whether long term insulin use [facility])   [place], [place], [place]   1 year ago Need for vaccination against Streptococcus pneumoniae   [place], RPH-CPP   1 year ago 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, with long-term current use of insulin, macular edema presence unspecified, unspecified retinopathy severity ([place])   [place], MD       Future Appointments             In 3 weeks Kirsteins, Luanna Salk, MD [facility] Medicine [facility], [facility]               famotidine (PEPCID) 20 MG tablet [Pharmacy Med Name: FAMOTIDINE 20MG  TABLETS] 180 tablet     Sig: TAKE 1 TABLET(20 MG) BY MOUTH TWICE DAILY      Gastroenterology:  H2  Antagonists Passed - 01/08/2021 11:04 AM      Passed - Valid encounter within last 12 months    Recent Outpatient Visits           4 months ago Hospital discharge follow-up   [place], MD   7 months ago Type 2 diabetes mellitus with stage 2 chronic kidney disease, unspecified whether long term insulin use ([place])   [place], Neoma Laming B, MD   1 year ago Type 2 diabetes mellitus with stage 2 chronic kidney disease, unspecified whether long term insulin use [facility])   [place], [place], [place]   1 year ago Need for vaccination against Streptococcus pneumoniae   [place], RPH-CPP   1 year ago 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, with long-term current use of insulin, macular edema presence unspecified, unspecified retinopathy severity [facility])   [place], MD       Future Appointments             In 3 weeks Kirsteins, Luanna Salk, MD [facility], [facility]               famotidine (PEPCID) 20 MG tablet [Pharmacy Med Name: FAMOTIDINE 20MG  TABLETS] 60 tablet 4    Sig: TAKE 1 TABLET(20 MG) BY MOUTH TWICE DAILY      Gastroenterology:  H2 Antagonists Passed - 01/08/2021 11:04 AM      Passed - Valid encounter within last 12 months    Recent Outpatient Visits           4 months ago Hospital discharge follow-up   [place] Karle Plumber B, MD   7 months ago Type 2 diabetes mellitus with stage 2 chronic kidney disease, unspecified whether long term insulin use ([place])   [place], Neoma Laming B, MD   1 year ago Type 2 diabetes mellitus with stage 2 chronic kidney disease, unspecified whether long term insulin use [facility])   [place], [place], [place]   1 year ago Need for vaccination against Streptococcus pneumoniae   [place], RPH-CPP   1 year ago 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, with long-term current use of insulin, macular edema presence unspecified, unspecified retinopathy severity [facility])   [place], MD       Future Appointments             In 3 weeks Kirsteins, Luanna Salk, MD [facility] Rehabilitation, CPR

## 2021-01-10 ENCOUNTER — Telehealth: Payer: Self-pay | Admitting: Internal Medicine

## 2021-01-10 DIAGNOSIS — M4302 Spondylolysis, cervical region: Secondary | ICD-10-CM

## 2021-01-10 NOTE — Telephone Encounter (Signed)
Requested medications are due for refill today yes  Requested medications are on the active medication list yes  Last refill 12/13/20, and 09/17/20  Last visit 08/2020  Future visit scheduled no  Notes to clinic Not Delegated

## 2021-01-13 NOTE — Telephone Encounter (Signed)
Pt is calling to ask why this was denied. Please advise CB- [phone]

## 2021-01-13 NOTE — Telephone Encounter (Signed)
Pt would like to know why these medications where denied? Please advise CB- [phone]

## 2021-01-14 ENCOUNTER — Other Ambulatory Visit: Payer: Self-pay | Admitting: Internal Medicine

## 2021-01-14 MED ORDER — TOPIRAMATE 25 MG PO CPSP: ORAL_CAPSULE | ORAL | 1 refills | Status: DC

## 2021-01-14 MED ORDER — TRAMADOL HCL 50 MG PO TABS: 50.0000 mg | ORAL_TABLET | Freq: Three times a day (TID) | ORAL | 0 refills | Status: DC | PRN

## 2021-01-14 MED ORDER — INSULIN ASPART PROT & ASPART (70-30 MIX) 100 UNIT/ML ~~LOC~~ SUSP: 40.0000 [IU] | Freq: Two times a day (BID) | SUBCUTANEOUS | 1 refills | Status: DC

## 2021-01-14 MED ORDER — FAMOTIDINE 20 MG PO TABS: 20.0000 mg | ORAL_TABLET | Freq: Two times a day (BID) | ORAL | 4 refills | Status: DC

## 2021-01-14 NOTE — Telephone Encounter (Signed)
Will forward to provider  

## 2021-01-14 NOTE — Telephone Encounter (Signed)
Pt called again tin regards to getting a refill on traMADol (ULTRAM) 50 MG tablet Please advise if a supply can be sent in until her next appt.

## 2021-01-14 NOTE — Addendum Note (Signed)
Addended by: Karle Plumber B on: 01/14/2021 10:06 PM   Modules accepted: Orders

## 2021-01-14 NOTE — Addendum Note (Signed)
Addended by: Daisy Blossom, Annie Main L on: 01/14/2021 04:57 PM   Modules accepted: Orders

## 2021-01-14 NOTE — Telephone Encounter (Signed)
Heidi Joiner do you know why meds were denied

## 2021-01-14 NOTE — Telephone Encounter (Signed)
Requested medication (s) are due for refill today: requesting 90 day refill  Requested medication (s) are on the active medication list: yes  Last refill:  01/14/21 #180 1 refill  Future visit scheduled: yes in 1 month  Notes to clinic:  patient requesting 90 day supply     Requested Prescriptions  Pending Prescriptions Disp Refills   topiramate (TOPAMAX) 25 MG capsule [Pharmacy Med Name: TOPIRAMATE 25MG  SPRINKLE CAPSULES] 540 capsule     Sig: TAKE 3 CAPSULES(75 MG) BY MOUTH TWICE DAILY      Not Delegated - Neurology: Anticonvulsants - topiramate & zonisamide Failed - 01/14/2021  5:01 PM      Failed - This refill cannot be delegated      Failed - Cr in normal range and within 360 days    Creat  Date Value Ref Range Status  05/12/2016 1.14 (H) 0.50 - 0.99 mg/dL Final    Comment:      For patients > or = 69 years of age: The upper reference limit for Creatinine is approximately 13% higher for people identified as African-American.      Creatinine, Ser  Date Value Ref Range Status  08/30/2020 1.16 (H) 0.57 - 1.00 mg/dL Final    Comment:                   **Effective September 02, 2020 [facility] will begin**                  reporting the 2021 CKD-EPI creatinine equation that                  estimates kidney function without a race variable.    Creatinine, Urine  Date Value Ref Range Status  11/27/2019 146.00 mg/dL Final    Comment:    Performed at [facility], [place] [address], [place], [place] [address]          Passed - CO2 in normal range and within 360 days    CO2  Date Value Ref Range Status  08/30/2020 24 20 - 29 mmol/L Final   Bicarbonate  Date Value Ref Range Status  08/12/2020 35.7 (H) 20.0 - 28.0 mmol/L Final          Passed - Valid encounter within last 12 months    Recent Outpatient Visits           4 months ago Hospital discharge follow-up   [place], Deborah B, MD   8 months ago Type 2 diabetes  mellitus with stage 2 chronic kidney disease, unspecified whether long term insulin use ([place])   [place], Neoma Laming B, MD   1 year ago Type 2 diabetes mellitus with stage 2 chronic kidney disease, unspecified whether long term insulin use [facility])   [place], [place], [place]   1 year ago Need for vaccination against Streptococcus pneumoniae   [place], RPH-CPP   1 year ago 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, with long-term current use of insulin, macular edema presence unspecified, unspecified retinopathy severity [facility])   [place], MD       Future Appointments             In 2 weeks Kirsteins, Luanna Salk, MD [facility] Rehabilitation, [facility]   In  1 month Ladell Pier, MD [place]

## 2021-01-14 NOTE — Addendum Note (Signed)
Addended by: Karle Plumber B on: 01/14/2021 10:04 PM   Modules accepted: Orders

## 2021-01-14 NOTE — Telephone Encounter (Signed)
Pt is requesting famotidine and it's not on the list.

## 2021-01-14 NOTE — Telephone Encounter (Signed)
Pt needed an appointment and did not have one scheduled at the time I refused the refill. She has one scheduled for September. Rx for insulin and topiramate sent. Dr. Wynetta Emery will have to send tramadol if appropriate. Additionally, famotidine is not on the list. She will have to approve this.

## 2021-01-15 ENCOUNTER — Telehealth: Payer: Self-pay | Admitting: Internal Medicine

## 2021-01-15 DIAGNOSIS — M4302 Spondylolysis, cervical region: Secondary | ICD-10-CM

## 2021-01-15 NOTE — Telephone Encounter (Signed)
Copied from [place] [id number]. Topic: Appointment Scheduling - Scheduling Inquiry for Clinic >> Jan 14, 2021  3:46 PM Yvette Rack wrote: Reason for CRM: Pt requests that an order for x-ray of entire neck be ordered. Pt requests call back.  Spoke with Patient and she stated she need the X-ray because she never did the US Thyroid  ordered and she is now experience a lot of pain in her neck.

## 2021-01-15 NOTE — Telephone Encounter (Signed)
Contacted pt and left a detailed vm making pt aware of provider response and if she has any questions or concerns to give [place] a call

## 2021-01-15 NOTE — Telephone Encounter (Signed)
FYI  Contacted pt and made aware that she will need to get [place] of thyroid done. Made pt aware that she no showed her appt on 09/05/20. Made pt aware that I am going to give her the number to call and reshceudled her [place]. Provided pt the number to the scheduling dept and made her aware to give them a call to get [place] rescheduled. Pt states she understands and she will give them a call

## 2021-01-20 ENCOUNTER — Telehealth: Payer: Self-pay | Admitting: Pharmacist

## 2021-01-20 ENCOUNTER — Ambulatory Visit: Payer: Self-pay | Admitting: *Deleted

## 2021-01-20 ENCOUNTER — Telehealth: Payer: Self-pay | Admitting: Internal Medicine

## 2021-01-20 NOTE — Telephone Encounter (Signed)
Patient called in to get short supply of insulin , novolog mix, until her refills come in on 07/23, because she received incorrect one in the beginning. Please call back.  Attempted to call patient- left message to call back.

## 2021-01-20 NOTE — Telephone Encounter (Signed)
Call to patient- Patient states she is out of her Novalog. Patient states she has different instructions on her Rx at home- then what is in her chart- patient states she has Rx with instructions- 80 units am/40 units pm and can go up to 60 units sliding scale. Patient states she is completely out of her insulin and her levels are- fasting- 280, now-275. Call to office- notified patient has unclear instructions and need RF.  Reason for Disposition  [1] Prescription refill request for ESSENTIAL medicine (i.e., likelihood of harm to patient if not taken) AND [2] triager unable to refill per department policy  Answer Assessment - Initial Assessment Questions 1. DRUG NAME: "What medicine do you need to have refilled?"     Novalog 70/30 2. REFILLS REMAINING: "How many refills are remaining?" (Note: The label on the medicine or pill bottle will show how many refills are remaining. If there are no refills remaining, then a renewal may be needed.)     none 3. EXPIRATION DATE: "What is the expiration date?" (Note: The label states when the prescription will expire, and thus can no longer be refilled.)     N/a 4. PRESCRIBING HCP: "Who prescribed it?" Reason: If prescribed by specialist, call should be referred to that group.     PCP 5. SYMPTOMS: "Do you have any symptoms?"     Patient has not taken today 6. PREGNANCY: "Is there any chance that you are pregnant?" "When was your last menstrual period?"     N/a  Protocols used: Medication Refill and Renewal Call-A-AH

## 2021-01-20 NOTE — Telephone Encounter (Signed)
Second attempt to call patient. Left message to call back.

## 2021-01-20 NOTE — Telephone Encounter (Signed)
Received this message from our call center:   Call to patient- Patient states she is out of her Novolog. Patient states she has different instructions on her Rx at home- then what is in her chart- patient states she has Rx with instructions- 80 units am/40 units pm and can go up to 60 units sliding scale. Patient states she is completely out of her insulin and her levels are- fasting- 280, now-275. Call to office- notified patient has unclear instructions and need RF  I attempted to reconcile this. It is unclear if patient is taking the correct dose. She confirmed 80 units in the morning and 40 units in the evening. I'm not sure about the sliding scale.   She has a hx of self-dosing her insulin and not taking as prescribed. Of note, she had an appt with Dr. Loanne Drilling on 12/24/2020 (Endo) but cancelled this.   I do not feel comfortable sending refill. I told her I would let her PCP know.

## 2021-01-21 NOTE — Telephone Encounter (Signed)
Will forward to covering provider.

## 2021-01-21 NOTE — Telephone Encounter (Signed)
It looks like a prescription for her insulin was sent in on 01/14/2021 so please have her check with the pharmacy for this.  Instructions in her chart are confusing but she can continue with her current home regimen until Dr. Wynetta Emery is back in the office next week.  She needs to reschedule her appointment with her endocrinologist.

## 2021-01-22 DIAGNOSIS — I10 Essential (primary) hypertension: Secondary | ICD-10-CM | POA: Diagnosis not present

## 2021-01-22 DIAGNOSIS — J45909 Unspecified asthma, uncomplicated: Secondary | ICD-10-CM | POA: Diagnosis not present

## 2021-01-22 NOTE — Telephone Encounter (Signed)
Contacted pt to go over Dr. Margarita Rana response pt didn't answer lvm   Sent a CRM and message to NT to give pt provider response when they call back

## 2021-01-26 ENCOUNTER — Encounter (HOSPITAL_COMMUNITY): Payer: Self-pay | Admitting: *Deleted

## 2021-01-26 ENCOUNTER — Observation Stay (HOSPITAL_COMMUNITY)
Admission: EM | Admit: 2021-01-26 | Discharge: 2021-01-28 | Disposition: A | Discharge status: Home / Self Care | Payer: Medicare HMO | Attending: Family Medicine | Admitting: Family Medicine

## 2021-01-26 DIAGNOSIS — R739 Hyperglycemia, unspecified: Secondary | ICD-10-CM

## 2021-01-26 DIAGNOSIS — Z7982 Long term (current) use of aspirin: Secondary | ICD-10-CM | POA: Diagnosis not present

## 2021-01-26 DIAGNOSIS — Z20822 Contact with and (suspected) exposure to covid-19: Secondary | ICD-10-CM | POA: Insufficient documentation

## 2021-01-26 DIAGNOSIS — I6523 Occlusion and stenosis of bilateral carotid arteries: Secondary | ICD-10-CM | POA: Diagnosis not present

## 2021-01-26 DIAGNOSIS — Z8616 Personal history of COVID-19: Secondary | ICD-10-CM | POA: Diagnosis not present

## 2021-01-26 DIAGNOSIS — S0990XA Unspecified injury of head, initial encounter: Secondary | ICD-10-CM | POA: Diagnosis not present

## 2021-01-26 DIAGNOSIS — Z794 Long term (current) use of insulin: Secondary | ICD-10-CM | POA: Insufficient documentation

## 2021-01-26 DIAGNOSIS — N182 Chronic kidney disease, stage 2 (mild): Secondary | ICD-10-CM | POA: Diagnosis not present

## 2021-01-26 DIAGNOSIS — Y900 Blood alcohol level of less than 20 mg/100 ml: Secondary | ICD-10-CM | POA: Diagnosis not present

## 2021-01-26 DIAGNOSIS — E1122 Type 2 diabetes mellitus with diabetic chronic kidney disease: Secondary | ICD-10-CM | POA: Insufficient documentation

## 2021-01-26 DIAGNOSIS — R531 Weakness: Secondary | ICD-10-CM | POA: Diagnosis not present

## 2021-01-26 DIAGNOSIS — I130 Hypertensive heart and chronic kidney disease with heart failure and stage 1 through stage 4 chronic kidney disease, or unspecified chronic kidney disease: Secondary | ICD-10-CM | POA: Diagnosis not present

## 2021-01-26 DIAGNOSIS — I10 Essential (primary) hypertension: Secondary | ICD-10-CM | POA: Diagnosis not present

## 2021-01-26 DIAGNOSIS — R918 Other nonspecific abnormal finding of lung field: Secondary | ICD-10-CM | POA: Diagnosis not present

## 2021-01-26 DIAGNOSIS — Z79899 Other long term (current) drug therapy: Secondary | ICD-10-CM | POA: Diagnosis not present

## 2021-01-26 DIAGNOSIS — I5032 Chronic diastolic (congestive) heart failure: Secondary | ICD-10-CM | POA: Diagnosis not present

## 2021-01-26 DIAGNOSIS — S199XXA Unspecified injury of neck, initial encounter: Secondary | ICD-10-CM | POA: Diagnosis not present

## 2021-01-26 DIAGNOSIS — E1165 Type 2 diabetes mellitus with hyperglycemia: Secondary | ICD-10-CM | POA: Diagnosis present

## 2021-01-26 DIAGNOSIS — J449 Chronic obstructive pulmonary disease, unspecified: Secondary | ICD-10-CM | POA: Insufficient documentation

## 2021-01-26 DIAGNOSIS — W19XXXA Unspecified fall, initial encounter: Secondary | ICD-10-CM

## 2021-01-26 DIAGNOSIS — J45909 Unspecified asthma, uncomplicated: Secondary | ICD-10-CM | POA: Insufficient documentation

## 2021-01-26 DIAGNOSIS — Y92009 Unspecified place in unspecified non-institutional (private) residence as the place of occurrence of the external cause: Secondary | ICD-10-CM

## 2021-01-26 DIAGNOSIS — Z9981 Dependence on supplemental oxygen: Secondary | ICD-10-CM | POA: Insufficient documentation

## 2021-01-26 DIAGNOSIS — J9811 Atelectasis: Secondary | ICD-10-CM | POA: Diagnosis not present

## 2021-01-26 DIAGNOSIS — G9340 Encephalopathy, unspecified: Principal | ICD-10-CM | POA: Diagnosis present

## 2021-01-26 DIAGNOSIS — I517 Cardiomegaly: Secondary | ICD-10-CM | POA: Diagnosis not present

## 2021-01-26 DIAGNOSIS — R7989 Other specified abnormal findings of blood chemistry: Secondary | ICD-10-CM | POA: Diagnosis not present

## 2021-01-26 DIAGNOSIS — Z043 Encounter for examination and observation following other accident: Secondary | ICD-10-CM | POA: Diagnosis not present

## 2021-01-26 DIAGNOSIS — R519 Headache, unspecified: Secondary | ICD-10-CM | POA: Diagnosis present

## 2021-01-26 LAB — CBG MONITORING, ED: Glucose-Capillary: 389 mg/dL — ABNORMAL HIGH (ref 70–99)

## 2021-01-26 NOTE — ED Triage Notes (Signed)
Pt fell out of bed today and reports feeling bad throughout the day as if her blood sugar dropped. When patient checked her blood sugar, meter read in the 500s . C/o headache right now with slight blurred vision

## 2021-01-26 NOTE — ED Provider Notes (Signed)
Emergency Medicine Provider Triage Evaluation Note  Tanay Skaja , a 70 y.o. female  was evaluated in triage.  Pt complains of feeling unwell today. Patient states she rolled out of bed this morning, landing on the floor, did not hit head, no specific injuries from the event but has generalized body aches. Reports headache, history of same, no acute changes in baseline headaches. Patient took a nap today and when she woke up she felt like her sugar was very low but her glucose was 500 when checked.  Review of Systems  Positive: Body aches Negative: Bruising, deformities, lacerations   Physical Exam  There were no vitals taken for this visit. Gen:   Awake, no distress   Resp:  Normal effort  MSK:   Moves extremities without difficulty  Other:  No neck or back tenderness, no pain with palpation or ROM extremities  Medical Decision Making  Medically screening exam initiated at 11:01 PM.  Appropriate orders placed.  Simyah Gerich was informed that the remainder of the evaluation will be completed by another provider, this initial triage assessment does not replace that evaluation, and the importance of remaining in the ED until their evaluation is complete.     Tacy Learn, PA-C 01/26/21 [id number]    Isla Pence, MD 01/26/21 2325

## 2021-01-27 ENCOUNTER — Emergency Department (HOSPITAL_COMMUNITY): Payer: Medicare HMO

## 2021-01-27 ENCOUNTER — Observation Stay (HOSPITAL_COMMUNITY): Payer: Medicare HMO

## 2021-01-27 DIAGNOSIS — R7989 Other specified abnormal findings of blood chemistry: Secondary | ICD-10-CM | POA: Diagnosis not present

## 2021-01-27 DIAGNOSIS — E1165 Type 2 diabetes mellitus with hyperglycemia: Secondary | ICD-10-CM | POA: Diagnosis not present

## 2021-01-27 DIAGNOSIS — I6782 Cerebral ischemia: Secondary | ICD-10-CM | POA: Diagnosis not present

## 2021-01-27 DIAGNOSIS — R918 Other nonspecific abnormal finding of lung field: Secondary | ICD-10-CM | POA: Diagnosis not present

## 2021-01-27 DIAGNOSIS — I10 Essential (primary) hypertension: Secondary | ICD-10-CM | POA: Diagnosis not present

## 2021-01-27 DIAGNOSIS — G9340 Encephalopathy, unspecified: Secondary | ICD-10-CM | POA: Diagnosis present

## 2021-01-27 DIAGNOSIS — I6523 Occlusion and stenosis of bilateral carotid arteries: Secondary | ICD-10-CM | POA: Diagnosis not present

## 2021-01-27 DIAGNOSIS — W19XXXA Unspecified fall, initial encounter: Secondary | ICD-10-CM

## 2021-01-27 DIAGNOSIS — Z043 Encounter for examination and observation following other accident: Secondary | ICD-10-CM | POA: Diagnosis not present

## 2021-01-27 DIAGNOSIS — I517 Cardiomegaly: Secondary | ICD-10-CM | POA: Diagnosis not present

## 2021-01-27 DIAGNOSIS — S0990XA Unspecified injury of head, initial encounter: Secondary | ICD-10-CM | POA: Diagnosis not present

## 2021-01-27 DIAGNOSIS — S199XXA Unspecified injury of neck, initial encounter: Secondary | ICD-10-CM | POA: Diagnosis not present

## 2021-01-27 DIAGNOSIS — R531 Weakness: Secondary | ICD-10-CM | POA: Diagnosis not present

## 2021-01-27 DIAGNOSIS — Y92009 Unspecified place in unspecified non-institutional (private) residence as the place of occurrence of the external cause: Secondary | ICD-10-CM | POA: Diagnosis not present

## 2021-01-27 DIAGNOSIS — J9811 Atelectasis: Secondary | ICD-10-CM | POA: Diagnosis not present

## 2021-01-27 DIAGNOSIS — R29818 Other symptoms and signs involving the nervous system: Secondary | ICD-10-CM | POA: Diagnosis not present

## 2021-01-27 LAB — CBG MONITORING, ED
Glucose-Capillary: 258 mg/dL — ABNORMAL HIGH (ref 70–99)
Glucose-Capillary: 216 mg/dL — ABNORMAL HIGH (ref 70–99)
Glucose-Capillary: 272 mg/dL — ABNORMAL HIGH (ref 70–99)
Glucose-Capillary: 302 mg/dL — ABNORMAL HIGH (ref 70–99)

## 2021-01-27 LAB — I-STAT VENOUS BLOOD GAS, ED
Acid-Base Excess: 2 mmol/L (ref 0.0–2.0)
Bicarbonate: 30.9 mmol/L — ABNORMAL HIGH (ref 20.0–28.0)
Calcium, Ion: 1.24 mmol/L (ref 1.15–1.40)
HCT: 42 % (ref 36.0–46.0)
Hemoglobin: 14.3 g/dL (ref 12.0–15.0)
O2 Saturation: 99 %
Potassium: 4 mmol/L (ref 3.5–5.1)
Sodium: 140 mmol/L (ref 135–145)
TCO2: 33 mmol/L — ABNORMAL HIGH (ref 22–32)
pCO2, Ven: 64.8 mmHg — ABNORMAL HIGH (ref 44.0–60.0)
pH, Ven: 7.287 (ref 7.250–7.430)
pO2, Ven: 151 mmHg — ABNORMAL HIGH (ref 32.0–45.0)

## 2021-01-27 LAB — URINALYSIS, ROUTINE W REFLEX MICROSCOPIC
Bacteria, UA: NONE SEEN
Bilirubin Urine: NEGATIVE
Glucose, UA: >=500 mg/dL — AB
Hgb urine dipstick: NEGATIVE
Ketones, ur: NEGATIVE mg/dL
Leukocytes,Ua: NEGATIVE
Nitrite: NEGATIVE
Protein, ur: NEGATIVE mg/dL
Specific Gravity, Urine: 1.023 (ref 1.005–1.030)
pH: 6 (ref 5.0–8.0)

## 2021-01-27 LAB — RAPID URINE DRUG SCREEN, HOSP PERFORMED
Amphetamines: NOT DETECTED
Barbiturates: NOT DETECTED
Benzodiazepines: NOT DETECTED
Cocaine: NOT DETECTED
Opiates: POSITIVE — AB
Tetrahydrocannabinol: NOT DETECTED

## 2021-01-27 LAB — COMPREHENSIVE METABOLIC PANEL
ALT: 18 U/L (ref 0–44)
AST: 16 U/L (ref 15–41)
Albumin: 3.4 g/dL — ABNORMAL LOW (ref 3.5–5.0)
Alkaline Phosphatase: 119 U/L (ref 38–126)
Anion gap: 8 (ref 5–15)
BUN: 8 mg/dL (ref 8–23)
CO2: 26 mmol/L (ref 22–32)
Calcium: 9 mg/dL (ref 8.9–10.3)
Chloride: 101 mmol/L (ref 98–111)
Creatinine, Ser: 0.95 mg/dL (ref 0.44–1.00)
GFR, Estimated: >60 mL/min (ref >60)
Glucose, Bld: 414 mg/dL — ABNORMAL HIGH (ref 70–99)
Potassium: 4 mmol/L (ref 3.5–5.1)
Sodium: 135 mmol/L (ref 135–145)
Total Bilirubin: 0.2 mg/dL — ABNORMAL LOW (ref 0.3–1.2)
Total Protein: 6.1 g/dL — ABNORMAL LOW (ref 6.5–8.1)

## 2021-01-27 LAB — CBC WITH DIFFERENTIAL/PLATELET
Abs Immature Granulocytes: 0.05 10*3/uL (ref 0.00–0.07)
Basophils Absolute: 0.1 10*3/uL (ref 0.0–0.1)
Basophils Relative: 1 %
Eosinophils Absolute: 0.4 10*3/uL (ref 0.0–0.5)
Eosinophils Relative: 6 %
HCT: 41.6 % (ref 36.0–46.0)
Hemoglobin: 12.5 g/dL (ref 12.0–15.0)
Immature Granulocytes: 1 %
Lymphocytes Relative: 29 %
Lymphs Abs: 2.1 10*3/uL (ref 0.7–4.0)
MCH: 27.8 pg (ref 26.0–34.0)
MCHC: 30 g/dL (ref 30.0–36.0)
MCV: 92.7 fL (ref 80.0–100.0)
Monocytes Absolute: 0.6 10*3/uL (ref 0.1–1.0)
Monocytes Relative: 8 %
Neutro Abs: 4.1 10*3/uL (ref 1.7–7.7)
Neutrophils Relative %: 55 %
Platelets: 196 10*3/uL (ref 150–400)
RBC: 4.49 MIL/uL (ref 3.87–5.11)
RDW: 14.3 % (ref 11.5–15.5)
WBC: 7.2 10*3/uL (ref 4.0–10.5)
nRBC: 0 % (ref 0.0–0.2)

## 2021-01-27 LAB — RESP PANEL BY RT-PCR (FLU A&B, COVID) ARPGX2
Influenza A by PCR: NEGATIVE
Influenza B by PCR: NEGATIVE
SARS Coronavirus 2 by RT PCR: NEGATIVE

## 2021-01-27 LAB — TROPONIN I (HIGH SENSITIVITY)
Troponin I (High Sensitivity): 5 ng/L (ref <18)
Troponin I (High Sensitivity): 3 ng/L (ref <18)

## 2021-01-27 LAB — CK: Total CK: 223 U/L (ref 38–234)

## 2021-01-27 LAB — ACETAMINOPHEN LEVEL: Acetaminophen (Tylenol), Serum: <10 ug/mL — ABNORMAL LOW (ref 10–30)

## 2021-01-27 LAB — ETHANOL: Alcohol, Ethyl (B): <10 mg/dL (ref <10)

## 2021-01-27 LAB — SALICYLATE LEVEL: Salicylate Lvl: <7 mg/dL — ABNORMAL LOW (ref 7.0–30.0)

## 2021-01-27 LAB — AMMONIA: Ammonia: 37 umol/L — ABNORMAL HIGH (ref 9–35)

## 2021-01-27 MED ORDER — SENNOSIDES-DOCUSATE SODIUM 8.6-50 MG PO TABS: 1.0000 | ORAL_TABLET | Freq: Every evening | ORAL | Status: DC | PRN

## 2021-01-27 MED ORDER — PANTOPRAZOLE SODIUM 40 MG PO TBEC
40.0000 mg | DELAYED_RELEASE_TABLET | Freq: Every day | ORAL | Status: DC
  Administered 2021-01-27 – 2021-01-28 (×2): 40 mg via ORAL
  Filled 2021-01-27 (×2): qty 1

## 2021-01-27 MED ORDER — ATORVASTATIN CALCIUM 40 MG PO TABS
40.0000 mg | ORAL_TABLET | Freq: Every evening | ORAL | Status: DC
  Administered 2021-01-27: 40 mg via ORAL
  Filled 2021-01-27: qty 1

## 2021-01-27 MED ORDER — INSULIN ASPART 100 UNIT/ML IJ SOLN
0.0000 [IU] | Freq: Three times a day (TID) | INTRAMUSCULAR | Status: DC
  Administered 2021-01-27: 5 [IU] via SUBCUTANEOUS

## 2021-01-27 MED ORDER — FLUTICASONE FUROATE-VILANTEROL 200-25 MCG/INH IN AEPB
1.0000 | INHALATION_SPRAY | Freq: Every day | RESPIRATORY_TRACT | Status: DC
  Administered 2021-01-27 – 2021-01-28 (×2): 1 via RESPIRATORY_TRACT
  Filled 2021-01-27: qty 28

## 2021-01-27 MED ORDER — SODIUM CHLORIDE 0.9 % IV BOLUS
500.0000 mL | Freq: Once | INTRAVENOUS | Status: AC
  Administered 2021-01-27: 500 mL via INTRAVENOUS

## 2021-01-27 MED ORDER — ENOXAPARIN SODIUM 60 MG/0.6ML IJ SOSY
50.0000 mg | PREFILLED_SYRINGE | INTRAMUSCULAR | Status: DC
  Filled 2021-01-27 (×2): qty 0.5

## 2021-01-27 MED ORDER — ALBUTEROL SULFATE HFA 108 (90 BASE) MCG/ACT IN AERS
1.0000 | INHALATION_SPRAY | RESPIRATORY_TRACT | Status: DC | PRN
Start: 2021-01-27 — End: 2021-01-28
  Administered 2021-01-27: 2 via RESPIRATORY_TRACT
  Filled 2021-01-27: qty 6.7

## 2021-01-27 MED ORDER — INSULIN ASPART PROT & ASPART (70-30 MIX) 100 UNIT/ML ~~LOC~~ SUSP
45.0000 [IU] | Freq: Every evening | SUBCUTANEOUS | Status: DC
  Administered 2021-01-27: 45 [IU] via SUBCUTANEOUS
  Filled 2021-01-27: qty 10

## 2021-01-27 MED ORDER — ALBUTEROL SULFATE (2.5 MG/3ML) 0.083% IN NEBU: 2.5000 mg | INHALATION_SOLUTION | RESPIRATORY_TRACT | Status: DC | PRN

## 2021-01-27 MED ORDER — SODIUM CHLORIDE 0.9 % IV SOLN: INTRAVENOUS | Status: DC

## 2021-01-27 MED ORDER — LORAZEPAM 1 MG PO TABS
1.0000 mg | ORAL_TABLET | Freq: Once | ORAL | Status: AC
  Administered 2021-01-27: 1 mg via ORAL
  Filled 2021-01-27: qty 1

## 2021-01-27 MED ORDER — ACETAMINOPHEN 160 MG/5ML PO SOLN: 650.0000 mg | ORAL | Status: DC | PRN

## 2021-01-27 MED ORDER — INSULIN ASPART 100 UNIT/ML IJ SOLN
10.0000 [IU] | Freq: Once | INTRAMUSCULAR | Status: AC
  Administered 2021-01-27: 10 [IU] via SUBCUTANEOUS

## 2021-01-27 MED ORDER — ACETAMINOPHEN 325 MG PO TABS: 650.0000 mg | ORAL_TABLET | ORAL | Status: DC | PRN

## 2021-01-27 MED ORDER — FAMOTIDINE 20 MG PO TABS
20.0000 mg | ORAL_TABLET | Freq: Two times a day (BID) | ORAL | Status: DC
  Administered 2021-01-27 – 2021-01-28 (×3): 20 mg via ORAL
  Filled 2021-01-27 (×3): qty 1

## 2021-01-27 MED ORDER — STROKE: EARLY STAGES OF RECOVERY BOOK
Freq: Once | Status: AC
  Filled 2021-01-27: qty 1

## 2021-01-27 MED ORDER — METHOCARBAMOL 500 MG PO TABS: 500.0000 mg | ORAL_TABLET | Freq: Four times a day (QID) | ORAL | Status: DC | PRN

## 2021-01-27 MED ORDER — ATENOLOL 25 MG PO TABS
25.0000 mg | ORAL_TABLET | Freq: Every day | ORAL | Status: DC
  Administered 2021-01-27 – 2021-01-28 (×2): 25 mg via ORAL
  Filled 2021-01-27 (×2): qty 1

## 2021-01-27 MED ORDER — HYDROCODONE-ACETAMINOPHEN 5-325 MG PO TABS
1.0000 | ORAL_TABLET | Freq: Once | ORAL | Status: AC
  Administered 2021-01-27: 1 via ORAL
  Filled 2021-01-27: qty 1

## 2021-01-27 MED ORDER — ASPIRIN 81 MG PO CHEW
81.0000 mg | CHEWABLE_TABLET | Freq: Every day | ORAL | Status: DC
  Administered 2021-01-27 – 2021-01-28 (×2): 81 mg via ORAL
  Filled 2021-01-27 (×2): qty 1

## 2021-01-27 MED ORDER — HYDROCODONE-ACETAMINOPHEN 5-325 MG PO TABS
1.0000 | ORAL_TABLET | ORAL | Status: DC | PRN
  Administered 2021-01-27 – 2021-01-28 (×3): 1 via ORAL
  Filled 2021-01-27 (×4): qty 1

## 2021-01-27 MED ORDER — OLOPATADINE HCL 0.1 % OP SOLN: 1.0000 [drp] | Freq: Two times a day (BID) | OPHTHALMIC | Status: DC | PRN

## 2021-01-27 MED ORDER — LOSARTAN POTASSIUM 50 MG PO TABS
50.0000 mg | ORAL_TABLET | Freq: Every day | ORAL | Status: DC
  Administered 2021-01-27 – 2021-01-28 (×2): 50 mg via ORAL
  Filled 2021-01-27 (×2): qty 1

## 2021-01-27 MED ORDER — ACETAMINOPHEN 650 MG RE SUPP: 650.0000 mg | RECTAL | Status: DC | PRN

## 2021-01-27 MED ORDER — INSULIN ASPART PROT & ASPART (70-30 MIX) 100 UNIT/ML ~~LOC~~ SUSP
70.0000 [IU] | Freq: Every day | SUBCUTANEOUS | Status: DC
  Administered 2021-01-28: 70 [IU] via SUBCUTANEOUS
  Filled 2021-01-27: qty 10

## 2021-01-27 MED ORDER — TOPIRAMATE 25 MG PO TABS
75.0000 mg | ORAL_TABLET | Freq: Two times a day (BID) | ORAL | Status: DC
  Administered 2021-01-27 – 2021-01-28 (×3): 75 mg via ORAL
  Filled 2021-01-27 (×3): qty 3

## 2021-01-27 MED ORDER — MONTELUKAST SODIUM 10 MG PO TABS
10.0000 mg | ORAL_TABLET | Freq: Every day | ORAL | Status: DC
  Administered 2021-01-27: 10 mg via ORAL
  Filled 2021-01-27 (×2): qty 1

## 2021-01-27 MED ORDER — IOHEXOL 350 MG/ML SOLN
100.0000 mL | Freq: Once | INTRAVENOUS | Status: AC | PRN
  Administered 2021-01-27: 100 mL via INTRAVENOUS

## 2021-01-27 NOTE — ED Notes (Signed)
Pt is upset about wait time. Discussed with pt policy about visitation.

## 2021-01-27 NOTE — Evaluation (Signed)
Clinical/Bedside Swallow Evaluation Patient Details  Name: Heidi [name] MRN: [id number] Date of Birth: September 24, 1950  Today's Date: 01/27/2021 Time: SLP Start Time (ACUTE ONLY): 1400 SLP Stop Time (ACUTE ONLY): 1410 SLP Time Calculation (min) (ACUTE ONLY): 10 min  Past Medical History:  Past Medical History:  Diagnosis Date   Adenomatous colon polyp    Anemia    Anxiety    Asthma    CHF (congestive heart failure) (HCC)    COPD (chronic obstructive pulmonary disease) ([place])    Diabetes mellitus without complication (HCC)    High cholesterol    Hypertension    Left knee injury    Pneumonia    Supplemental oxygen dependent    2L    Past Surgical History:  Past Surgical History:  Procedure Laterality Date   ABDOMINAL HYSTERECTOMY     CATARACT EXTRACTION Left    CATARACT EXTRACTION     LT 01/2017, 02/2017 on RT   CESAREAN SECTION     HPI:  70 year old female with history of COPD, diabetes mellitus, sleep apnea was brought into the ER patient was found to be confused by patient's daughter.  Chest x-ray shows multifocal pneumonia but CT result shows no pna. MRi negative for acute infarct   Assessment / Plan / Recommendation Clinical Impression  Pt demonstrates no immediate signs of aspiration or dysphagia but does report frequent choking with water. There was initially a question of pna, but per MD the CT result showed no pna. Will f/u once to bedside to observe with a meal given pts report. Pt to continue a regular diet and thin liquids. SLP Visit Diagnosis: Dysphagia, unspecified (R13.10)    Aspiration Risk  Mild aspiration risk    Diet Recommendation Regular;Thin liquid   Liquid Administration via: Cup;Straw    Other  Recommendations     Follow up Recommendations        Frequency and Duration            Prognosis        Swallow Study   General HPI: 70 year old female with history of COPD, diabetes mellitus, sleep apnea was brought into the ER patient was  found to be confused by patient's daughter.  Chest x-ray shows multifocal pneumonia but CT result shows no pna. MRi negative for acute infarct Type of Study: Bedside Swallow Evaluation Diet Prior to this Study: Regular;Thin liquids History of Recent Intubation: No Behavior/Cognition: Alert;Cooperative;Pleasant mood Self-Feeding Abilities: Able to feed self Patient Positioning: Upright in bed Baseline Vocal Quality: Normal Volitional Cough: Strong Volitional Swallow: Able to elicit    Oral/Motor/Sensory Function Overall Oral Motor/Sensory Function: Within functional limits   Ice Chips     Thin Liquid Thin Liquid: Within functional limits    Nectar Thick     Honey Thick     Puree Puree: Within functional limits   Solid     Solid: Within functional limits      Jammie Troup, Katherene Ponto 01/27/2021,2:58 PM

## 2021-01-27 NOTE — H&P (Signed)
History and Physical    [name] CVE:[id number] DOB: October 18, 1950 DOA: 01/26/2021  PCP: Ladell Pier, MD Consultants:  Jaynee Eagles - neurology; Patwardhan - cardiology; Henrene Pastor - GI; Shearon Stalls - pulmonology Patient coming from:  Home - lives with daughter, 2 sons; NOK:  Daughter, Heidi [name], [phone]  Chief Complaint: Fall  HPI: Heidi [name] is a 70 y.o. female with medical history significant of chronic diastolic CHF; COPD on 2L home O2; DM; HTN; and HLD presenting with a fall out of bed on 7/24. She reports that at about 3-4 AM on 7/24, she fell out of bed.  She was previously on Lortab but her PCP here has been prescribing Tramadol and this causes her to have vivid dreams and nightmares.  She has chronic headaches due to an accident in 2016; she takes Botox for this.  She is currently having headache different from her usual (and she recently got Botox) as well as diffuse myalgias in her neck, arms, and back.  "It just did something" when she fell and she is certain something is wrong with her.  She also has chronic neck pain and is due for a steroid shot in her neck, but she reports that the current neck pain is worse than her usual.  She wears 2L home O2 and has had mild SOB the last few days despite the O2.      ED Course: Carryover, per Dr. Hal Hope:  70 year old female with history of COPD, diabetes mellitus, sleep apnea was brought into the ER patient was found to be confused by patient's daughter.  Also actually had taken 1 dose of Lortab of the patient's daughters.  Chest x-ray shows multifocal pneumonia.  So far work-up is negative MRI brain and CT angiogram of the chest is pending.  Review of Systems: As per HPI; otherwise review of systems reviewed and negative.   Ambulatory Status:  Ambulates with a cane  COVID Vaccine Status:  None  Past Medical History:  Diagnosis Date   Adenomatous colon polyp    Anemia    Anxiety    Asthma    CHF (congestive heart  failure) (HCC)    COPD (chronic obstructive pulmonary disease) ([place])    Diabetes mellitus without complication (HCC)    High cholesterol    Hypertension    Left knee injury    Pneumonia    Supplemental oxygen dependent    2L [place]    Past Surgical History:  Procedure Laterality Date   ABDOMINAL HYSTERECTOMY     CATARACT EXTRACTION Left    CATARACT EXTRACTION     LT 01/2017, 02/2017 on RT   CESAREAN SECTION      Social History   Socioeconomic History   Marital status: Widowed    Spouse name: Not on file   Number of children: 4   Years of education: Not on file   Highest education level: Not on file  Occupational History   Occupation: Disabled  Tobacco Use   Smoking status: Never   Smokeless tobacco: Never  Vaping Use   Vaping Use: Never used  Substance and Sexual Activity   Alcohol use: No   Drug use: No   Sexual activity: Not on file  Other Topics Concern   Not on file  Social History Narrative   Lives with daughter   Caffeine use: 1 cup coffee per day   Social Determinants of Health   Financial Resource Strain: Not on file  Food Insecurity: Not on file  Transportation Needs: Not on file  Physical Activity: Not on file  Stress: Not on file  Social Connections: Not on file  Intimate Partner Violence: Not on file    Allergies  Allergen Reactions   Januvia [Sitagliptin]     Chest pains    Family History  Problem Relation Age of Onset   Colon cancer Father    Diabetes type II Sister    Diabetes type II Brother    Colon cancer Sister    Migraines Neg Hx    Breast cancer Neg Hx     Prior to Admission medications   Medication Sig Start Date End Date Taking? Authorizing Provider  albuterol (PROVENTIL) (2.5 MG/3ML) 0.083% nebulizer solution USE 1 VIAL VIA NEBULIZER EVERY 6 HOURS AS NEEDED FOR WHEEZING OR SHORTNESS OF BREATH Patient taking differently: Take 2.5 mg by nebulization every 6 (six) hours as needed for wheezing or shortness of breath. 02/21/18   Yes Ladell Pier, MD  albuterol (VENTOLIN HFA) 108 (90 Base) MCG/ACT inhaler Inhale 1-2 puffs into the lungs every 4 (four) hours as needed for wheezing or shortness of breath. 10/21/20  Yes Spero Geralds, MD  aspirin 81 MG chewable tablet Chew 81 mg by mouth daily.    Yes [provider]  atenolol (TENORMIN) 25 MG tablet TAKE 1 TABLET(25 MG) BY MOUTH DAILY Patient taking differently: Take 25 mg by mouth daily. 08/28/20  Yes Freeman Caldron M, PA-C  Accu-Chek FastClix Lancets MISC Use as directed to test blood sugar three times daily DX E11.65 04/10/20   Ladell Pier, MD  atorvastatin (LIPITOR) 40 MG tablet TAKE 1 TABLET BY MOUTH DAILY AT 6 PM Patient taking differently: Take 40 mg by mouth every evening. 12/11/20   Ladell Pier, MD  Blood Glucose Monitoring Suppl (ACCU-CHEK GUIDE ME) w/Device KIT Use as directed to test blood sugar three times daily DX E11.65 04/10/20   Ladell Pier, MD  famotidine (PEPCID) 20 MG tablet Take 1 tablet (20 mg total) by mouth 2 (two) times daily. 01/14/21   Ladell Pier, MD  furosemide (LASIX) 40 MG tablet Take 1 tablet (40 mg total) by mouth daily. 08/19/20   Elgergawy, Silver Huguenin, MD  glucose blood (ACCU-CHEK GUIDE) test strip Use as directed to test blood sugar three times daily DX E11.65 04/10/20   Ladell Pier, MD  insulin aspart protamine- aspart (NOVOLOG MIX 70/30) (70-30) 100 UNIT/ML injection Inject 0.4 mLs (40 Units total) into the skin 2 (two) times daily with a meal. Inject 70 units in the morning and 45 units in the evening. 01/14/21   Ladell Pier, MD  INSULIN SYRINGE 1CC/29G 29G X 1/2" 1 ML MISC USE THREE TIMES DAILY AS DIRECTED 11/12/20   Ladell Pier, MD  loratadine (CLARITIN) 10 MG tablet Take 10 mg by mouth daily as needed for allergies.    [provider]  losartan (COZAAR) 50 MG tablet TAKE 1 TABLET(50 MG) BY MOUTH DAILY 10/07/20   Freeman Caldron M, PA-C  montelukast (SINGULAIR) 10 MG tablet  Take 1 tablet (10 mg total) by mouth at bedtime. 03/18/20   Tanda Rockers, MD  Multiple Vitamin (MULTIVITAMIN WITH MINERALS) TABS tablet Take 1 tablet by mouth daily.    [provider]  nitroGLYCERIN (NITROSTAT) 0.3 MG SL tablet Place 1 tablet (0.3 mg total) under the tongue every 5 (five) minutes as needed for chest pain. Max 3 tablets in 15 minutes 08/30/19   Argentina Donovan, PA-C  olopatadine (PATANOL) 0.1 % ophthalmic solution Place 1 drop into both eyes 2 (two) times daily. Patient taking differently: Place 1 drop into both eyes 2 (two) times daily as needed for allergies. 08/26/19   Asencion Noble, MD  pantoprazole (PROTONIX) 40 MG tablet TAKE 1 TABLET(40 MG) BY MOUTH DAILY 12/11/20   Ladell Pier, MD  Potassium Chloride ER 20 MEQ TBCR TAKE 1 TABLET BY MOUTH DAILY Patient taking differently: Take 20 mEq by mouth daily. 01/30/20   Ladell Pier, MD  tetrahydrozoline-zinc (VISINE-AC) 0.05-0.25 % ophthalmic solution Place 1 drop into both eyes 3 (three) times daily as needed.    [provider]  topiramate (TOPAMAX) 25 MG capsule TAKE 3 CAPSULES(75 MG) BY MOUTH TWICE DAILY 01/14/21   Ladell Pier, MD  traMADol (ULTRAM) 50 MG tablet Take 1 tablet (50 mg total) by mouth every 8 (eight) hours as needed (Prescription must last 1 month). 01/14/21   Ladell Pier, MD  Fexofenadine HCl [facility] ALLERGY PO) Take 1 tablet by mouth daily as needed (for allergy).  08/01/20  [provider]    Physical Exam: Vitals:   01/27/21 0715 01/27/21 0900 01/27/21 1011 01/27/21 1433  BP: 123/63  137/66 (!) 143/62  Pulse: 79  89 87  Resp: (!) _0 Temp:    98.8 F (37.1 C)  TempSrc:    Oral  SpO2: 100%  93% 99%  Weight:  104.3 kg       General:  Appears calm and comfortable and is in NAD Eyes:   EOMI, normal lids, iris ENT:  grossly normal hearing, lips & tongue, mmm  Neck:  no LAD, masses or thyromegaly Cardiovascular:  RRR, no m/r/g. No LE edema.   Respiratory:   CTA bilaterally with no wheezes/rales/rhonchi.  Normal to mildly increased respiratory effort, on 3L [place] O2. Abdomen:  soft, NT, ND Skin:  no rash or induration seen on limited exam Musculoskeletal:  grossly normal tone BUE/BLE, good ROM, no bony abnormality Psychiatric:  grossly normal mood and affect, speech fluent and appropriate, AOx3 Neurologic:  CN 2-12 grossly intact, moves all extremities in coordinated fashion    Radiological Exams on Admission: Independently reviewed - see discussion in A/P where applicable  CT Angio Head W or Wo Contrast  Result Date: 01/27/2021 CLINICAL DATA:  Acute stroke suspected EXAM: CT ANGIOGRAPHY HEAD AND NECK TECHNIQUE: Multidetector CT imaging of the head and neck was performed using the standard protocol during bolus administration of intravenous contrast. Multiplanar CT image reconstructions and MIPs were obtained to evaluate the vascular anatomy. Carotid stenosis measurements (when applicable) are obtained utilizing NASCET criteria, using the distal internal carotid diameter as the denominator. CONTRAST:  146m OMNIPAQUE IOHEXOL 350 MG/ML SOLN COMPARISON:  Noncontrast head CT from earlier today FINDINGS: CTA NECK FINDINGS Aortic arch: Negative.  Three vessel branching Right carotid system: Vessels are smooth and widely patent with no atheromatous changes Left carotid system: Vessels are smooth and widely patent with no atheromatous changes. Vertebral arteries: No proximal subclavian or vertebral stenosis. No vessel beading Skeleton: Mid to lower cervical disc degeneration. Benign heterogeneity/sclerosis of the C5 vertebral body. Other neck: 12 mm left thyroid nodule. No followup recommended (ref: J Am Coll Radiol. 2015 Feb;12(2): 143-50). Upper chest: Negative Review of the MIP images confirms the above findings CTA HEAD FINDINGS Anterior circulation: Atheromatous calcification of the carotid siphon walls. No flow limiting stenosis or branch  occlusion of major vessels. Negative for aneurysm or beading. Posterior  circulation: Vessels are smooth and widely patent with no aneurysm or beading. Venous sinuses: Diffusely patent. Anatomic variants: Hypoplastic right A1 segment. Review of the MIP images confirms the above findings IMPRESSION: No emergent finding.  Mild atherosclerosis for age. Electronically Signed   By: Monte Fantasia M.D.   On: 01/27/2021 05:41   DG Chest 2 View  Result Date: 01/27/2021 CLINICAL DATA:  Fall EXAM: CHEST - 2 VIEW COMPARISON:  09/13/2020 FINDINGS: Multifocal nodular airspace opacity bilaterally. No pleural effusion or pneumothorax. Normal cardiomediastinal contours. IMPRESSION: Multifocal nodular airspace opacity bilaterally, concerning for multifocal pneumonia. Followup PA and lateral chest X-ray is recommended in 3-4 weeks to ensure resolution and exclude underlying malignancy. Electronically Signed   By: Ulyses Jarred M.D.   On: 01/27/2021 02:41   CT Head Wo Contrast  Result Date: 01/27/2021 CLINICAL DATA:  Head trauma, mod-severe; Polytrauma, critical, head/C-spine injury suspected EXAM: CT HEAD WITHOUT CONTRAST CT CERVICAL SPINE WITHOUT CONTRAST TECHNIQUE: Multidetector CT imaging of the head and cervical spine was performed following the standard protocol without intravenous contrast. Multiplanar CT image reconstructions of the cervical spine were also generated. COMPARISON:  Head CT 08/10/2020 FINDINGS: CT HEAD FINDINGS Brain: There is no mass, hemorrhage or extra-axial collection. The size and configuration of the ventricles and extra-axial CSF spaces are normal. The brain parenchyma is normal, without evidence of acute or chronic infarction. Vascular: No abnormal hyperdensity of the major intracranial arteries or dural venous sinuses. No intracranial atherosclerosis. Skull: The visualized skull base, calvarium and extracranial soft tissues are normal. Sinuses/Orbits: No fluid levels or advanced mucosal  thickening of the visualized paranasal sinuses. No mastoid or middle ear effusion. The orbits are normal. CT CERVICAL SPINE FINDINGS Alignment: No static subluxation. Facets are aligned. Occipital condyles are normally positioned. Skull base and vertebrae: No acute fracture. Soft tissues and spinal canal: No prevertebral fluid or swelling. No visible canal hematoma. Disc levels: No advanced spinal canal or neural foraminal stenosis. Upper chest: No pneumothorax, pulmonary nodule or pleural effusion. Other: Normal visualized paraspinal cervical soft tissues. IMPRESSION: 1. No acute intracranial abnormality. 2. No acute fracture or static subluxation of the cervical spine. Electronically Signed   By: Ulyses Jarred M.D.   On: 01/27/2021 02:46   CT Angio Neck W and/or Wo Contrast  Result Date: 01/27/2021 CLINICAL DATA:  Acute stroke suspected EXAM: CT ANGIOGRAPHY HEAD AND NECK TECHNIQUE: Multidetector CT imaging of the head and neck was performed using the standard protocol during bolus administration of intravenous contrast. Multiplanar CT image reconstructions and MIPs were obtained to evaluate the vascular anatomy. Carotid stenosis measurements (when applicable) are obtained utilizing NASCET criteria, using the distal internal carotid diameter as the denominator. CONTRAST:  117m OMNIPAQUE IOHEXOL 350 MG/ML SOLN COMPARISON:  Noncontrast head CT from earlier today FINDINGS: CTA NECK FINDINGS Aortic arch: Negative.  Three vessel branching Right carotid system: Vessels are smooth and widely patent with no atheromatous changes Left carotid system: Vessels are smooth and widely patent with no atheromatous changes. Vertebral arteries: No proximal subclavian or vertebral stenosis. No vessel beading Skeleton: Mid to lower cervical disc degeneration. Benign heterogeneity/sclerosis of the C5 vertebral body. Other neck: 12 mm left thyroid nodule. No followup recommended (ref: J Am Coll Radiol. 2015 Feb;12(2): 143-50). Upper  chest: Negative Review of the MIP images confirms the above findings CTA HEAD FINDINGS Anterior circulation: Atheromatous calcification of the carotid siphon walls. No flow limiting stenosis or branch occlusion of major vessels. Negative for aneurysm or beading. Posterior circulation: Vessels are  smooth and widely patent with no aneurysm or beading. Venous sinuses: Diffusely patent. Anatomic variants: Hypoplastic right A1 segment. Review of the MIP images confirms the above findings IMPRESSION: No emergent finding.  Mild atherosclerosis for age. Electronically Signed   By: Monte Fantasia M.D.   On: 01/27/2021 05:41   CT Angio Chest PE W and/or Wo Contrast  Result Date: 01/27/2021 CLINICAL DATA:  Positive D-dimer with pulmonary embolism suspected. EXAM: CT ANGIOGRAPHY CHEST WITH CONTRAST TECHNIQUE: Multidetector CT imaging of the chest was performed using the standard protocol during bolus administration of intravenous contrast. Multiplanar CT image reconstructions and MIPs were obtained to evaluate the vascular anatomy. CONTRAST:  120m OMNIPAQUE IOHEXOL 350 MG/ML SOLN COMPARISON:  08/10/2020 FINDINGS: Cardiovascular: Cardiomegaly. No pericardial effusion. Scattered atheromatous calcification of the aorta which is mild. Suboptimal pulmonary artery bolus density exacerbated by streak artifact and soft tissue attenuation. Today scan is similar if not even more suboptimal than the comparison. No pulmonary emboli are seen. Mediastinum/Nodes: Negative for adenopathy or mass Lungs/Pleura: Mass streaky opacity at the lung bases with volume loss. No edema, effusion, or pneumothorax. Upper Abdomen: No acute finding. Partially covered cyst at the upper pole left kidney measuring 7.3 cm. Low-density liver which may be from contrast timing Musculoskeletal: No acute finding generalized degenerative endplate spurring. Review of the MIP images confirms the above findings. IMPRESSION: 1. Suboptimal pulmonary artery  opacification similar to attempt earlier this year. No evidence of pulmonary embolism to the lobar/segmental level. 2. Cardiomegaly. 3. Atelectasis. Electronically Signed   By: JMonte FantasiaM.D.   On: 01/27/2021 05:45   CT Cervical Spine Wo Contrast  Result Date: 01/27/2021 CLINICAL DATA:  Head trauma, mod-severe; Polytrauma, critical, head/C-spine injury suspected EXAM: CT HEAD WITHOUT CONTRAST CT CERVICAL SPINE WITHOUT CONTRAST TECHNIQUE: Multidetector CT imaging of the head and cervical spine was performed following the standard protocol without intravenous contrast. Multiplanar CT image reconstructions of the cervical spine were also generated. COMPARISON:  Head CT 08/10/2020 FINDINGS: CT HEAD FINDINGS Brain: There is no mass, hemorrhage or extra-axial collection. The size and configuration of the ventricles and extra-axial CSF spaces are normal. The brain parenchyma is normal, without evidence of acute or chronic infarction. Vascular: No abnormal hyperdensity of the major intracranial arteries or dural venous sinuses. No intracranial atherosclerosis. Skull: The visualized skull base, calvarium and extracranial soft tissues are normal. Sinuses/Orbits: No fluid levels or advanced mucosal thickening of the visualized paranasal sinuses. No mastoid or middle ear effusion. The orbits are normal. CT CERVICAL SPINE FINDINGS Alignment: No static subluxation. Facets are aligned. Occipital condyles are normally positioned. Skull base and vertebrae: No acute fracture. Soft tissues and spinal canal: No prevertebral fluid or swelling. No visible canal hematoma. Disc levels: No advanced spinal canal or neural foraminal stenosis. Upper chest: No pneumothorax, pulmonary nodule or pleural effusion. Other: Normal visualized paraspinal cervical soft tissues. IMPRESSION: 1. No acute intracranial abnormality. 2. No acute fracture or static subluxation of the cervical spine. Electronically Signed   By: KUlyses JarredM.D.   On:  01/27/2021 02:46   MR BRAIN WO CONTRAST  Result Date: 01/27/2021 CLINICAL DATA:  Neuro deficit, acute, stroke suspected EXAM: MRI HEAD WITHOUT CONTRAST TECHNIQUE: Multiplanar, multiecho pulse sequences of the brain and surrounding structures were obtained without intravenous contrast. COMPARISON:  08/10/2020 FINDINGS: Brain: There is no acute infarction or intracranial hemorrhage. There is no intracranial mass, mass effect, or edema. There is no hydrocephalus or extra-axial fluid collection. Patchy and confluent areas of T2 hyperintensity in  the supratentorial and pontine white matter are nonspecific but probably reflect moderate chronic microvascular ischemic changes. Ventricles and sulci are stable in size and configuration. Vascular: Major vessel flow voids at the skull base are preserved. Skull and upper cervical spine: Normal marrow signal is preserved. Sinuses/Orbits: Paranasal sinuses are aerated. Bilateral lens replacements. Other: Sella is unremarkable.  Mastoid air cells are clear. IMPRESSION: No evidence of recent infarction, hemorrhage, or mass. Moderate chronic microvascular ischemic changes. Electronically Signed   By: Macy Mis M.D.   On: 01/27/2021 10:37   DG Knee Complete 4 Views Left  Result Date: 01/27/2021 CLINICAL DATA:  Fall EXAM: LEFT KNEE - COMPLETE 4+ VIEW COMPARISON:  None. FINDINGS: No evidence of fracture, dislocation, or joint effusion. No evidence of arthropathy or other focal bone abnormality. Soft tissues are unremarkable. IMPRESSION: Negative. Electronically Signed   By: Ulyses Jarred M.D.   On: 01/27/2021 02:38   DG Knee Complete 4 Views Right  Result Date: 01/27/2021 CLINICAL DATA:  Fall EXAM: RIGHT KNEE - COMPLETE 4+ VIEW COMPARISON:  None. FINDINGS: No evidence of fracture, dislocation, or joint effusion. No evidence of arthropathy or other focal bone abnormality. Soft tissues are unremarkable. IMPRESSION: Negative. Electronically Signed   By: Ulyses Jarred M.D.    On: 01/27/2021 02:38    EKG: Independently reviewed.  NSR with rate 87; nonspecific ST changes with no evidence of acute ischemia   Labs on Admission: I have personally reviewed the available labs and imaging studies at the time of the admission.  Pertinent labs:   Glucose 414 Albumin 3.4 NH4 37 CK 223 HS troponin 5 Normal CBC COVID/flu negative UA: >500 glucose ASA <7 ETOH <10 ABG: 7.287;64.8/161/30.9   Assessment/Plan Principal Problem:   Fall at home, initial encounter Active Problems:   Uncontrolled type 2 diabetes mellitus with hyperglycemia ([place])   Essential hypertension   Morbid obesity due to excess calories ([place]) complicated by hbp/ dm / hyperlipidemia   Acute encephalopathy   Fall -Patient with vivid dreams and reported fall out of bed on 7/24 early AM -She attributes this to Tramadol  -No apparent injuries on exam or imaging -PT has seen the patient and recommends [facility] care -She could be discharged due to medical stability but prefers to be monitored overnight; it has been explained to the patient that no further treatments appear to be indicated at this time and that she is likely to remain in the ER for the duration of her stay  AMS -Appears to have resolved -There was some concern about CVA and so MRI was performed and did not show evidence of a stroke  Chronic diastolic CHF -Continue ASA -Appears to be compensated  COPD -Patient is on 2L home O2 and is currently on her usual regimen -No current evidence of decompensation -Blood gas appears to be at/better than her usual baseline -Continue Advair (Breo formulary substitution), Singulair -Continue prn Albuterol  DM -Poorly controlled; prior A1c was 9.9 on 08/30/20 -Will check A1c -Continue 70/30 -Cover with moderate-scale SSI   HTN -Continue Atenolol  HLD -Continue Lipitor, Cozaar  Chronic pain -I have reviewed this patient in the [place] Controlled Substances Reporting System.  She is receiving  medications from only one provider and appears to be taking them as prescribed. -She is not at particularly high risk of opioid misuse, diversion, or overdose.  -She is dissatisfied with Tramadol and wants to resume Lortab dosing -Continue Topamax  Obesity -Body mass index is 39.47 kg/m..  -Weight loss should  be encouraged -Outpatient PCP/bariatric medicine f/u encouraged     Note: This patient has been tested and is negative for the novel coronavirus COVID-19. The patient has NOT been vaccinated against COVID-19.   Level of care: Telemetry Medical DVT prophylaxis:  Lovenox  Code Status:  Full - confirmed with patient Family Communication: None present Disposition Plan:  The patient is from: home  Anticipated d/c is to: home without [facility] services   Anticipated d/c date will depend on clinical response to treatment, but possibly as early as tomorrow if she has excellent response to treatment  Patient is currently: acutely ill Consults called: PT/OT/ST/TOC/Nutrition  Admission status:  It is my clinical opinion that referral for OBSERVATION is reasonable and necessary in this patient based on the above information provided. The aforementioned taken together are felt to place the patient at high risk for further clinical deterioration. However it is anticipated that the patient may be medically stable for discharge from the hospital within 24 to 48 hours.    Karmen Bongo MD [facility]   How to contact the [facility] Attending or Consulting provider [place] or covering provider during after hours [place], for this patient?  Check the care team in [facility] and look for a) attending/consulting [facility] provider listed and b) the [facility] team listed Log into [email or web address] and use [place]'s universal password to access. If you do not have the password, please contact the hospital operator. Locate the [facility] provider you are looking for under [facility] and page to a number that you can be  directly reached. If you still have difficulty reaching the provider, please page the [facility] (Director on Call) for the Hospitalists listed on amion for assistance.   01/27/2021, 6:43 PM

## 2021-01-27 NOTE — Evaluation (Signed)
Physical Therapy Evaluation Patient Details Name: Heidi [name] MRN: [id number] DOB: 07/29/1950 Today's Date: 01/27/2021   History of Present Illness  70yo female admitted 01/26/21 after falling OOB due to vivid nightmares. PE negative, CT/MRI negative for acute changes. Thought to have acute encephalopathy. PMH anxiety, CHF, COPD, DM, HTN, O2 PRN  Clinical Impression   Patient received in bed, pleasant and cooperative. Able to mobilize on a min guard level with SPC but mildly unsteady and weak. Would really benefit from BUE support on RW. Became SOB with activity on room air, pulse ox/dynamap not available in hallway station but symptoms resolved with seated rest. Able to toilet/perform pericare with distant S. Left on ED stretcher with all needs met, nursing aware of patient status. Would benefit from skilled HHPT f/u at DC.     Follow Up Recommendations Home health PT;Supervision for mobility/OOB    Equipment Recommendations  Rolling walker with 5" wheels;3in1 (PT)    Recommendations for Other Services       Precautions / Restrictions Precautions Precautions: Fall Restrictions Weight Bearing Restrictions: No      Mobility  Bed Mobility Overal bed mobility: Needs Assistance Bed Mobility: Supine to Sit;Sit to Supine     Supine to sit: Min guard;HOB elevated Sit to supine: Min assist;HOB elevated   General bed mobility comments: min guard/head of stretcher elevated and increased time/effort to get to EOB; did need MinA to get BLEs back into bed    Transfers Overall transfer level: Needs assistance Equipment used: Straight cane Transfers: Sit to/from Stand Sit to Stand: Min guard         General transfer comment: min guard for safety, increased time and effort, no physical assist given  Ambulation/Gait Ambulation/Gait assistance: Min guard Gait Distance (Feet): 40 Feet (58fx2) Assistive device: Straight cane Gait Pattern/deviations: Step-through  pattern;Decreased step length - right;Decreased step length - left;Trunk flexed;Drifts right/left Gait velocity: decreased   General Gait Details: slow and steady with SPC, some SOB noted with gait on RA but pulse ox unavailable; able to recover with seated rest  Stairs            Wheelchair Mobility    Modified Rankin (Stroke Patients Only)       Balance Overall balance assessment: Needs assistance Sitting-balance support: Bilateral upper extremity supported;Feet unsupported Sitting balance-Leahy Scale: Fair     Standing balance support: Single extremity supported;During functional activity Standing balance-Leahy Scale: Poor Standing balance comment: reliant on at least U UE support                             Pertinent Vitals/Pain Pain Assessment: Faces Faces Pain Scale: Hurts little more Pain Location: generalized Pain Descriptors / Indicators: Aching;Sore Pain Intervention(s): Limited activity within patient's tolerance;Monitored during session    HRedfordexpects to be discharged to:: Private residence Living Arrangements: Children Available Help at Discharge: Family;Available PRN/intermittently Type of Home: Apartment Home Access: Stairs to enter Entrance Stairs-Rails: Right Entrance Stairs-Number of Steps: Flight (13) with right ascending rail Home Layout: One level Home Equipment: Walker - 4 wheels;Cane - single point      Prior Function Level of Independence: Needs assistance         Comments: Pt reports mod indep ambulating with cane or rollator (seat broken); has difficulty getting up/down steps into apartment. Drives seldomly. Cooks some, daughter assists with cleaning (info gleaned from chart review as pt unable to provide info)  Hand Dominance   Dominant Hand: Right    Extremity/Trunk Assessment   Upper Extremity Assessment Upper Extremity Assessment: Defer to OT evaluation    Lower Extremity  Assessment Lower Extremity Assessment: Generalized weakness    Cervical / Trunk Assessment Cervical / Trunk Assessment: Normal  Communication      Cognition Arousal/Alertness: Awake/alert Behavior During Therapy: Flat affect Overall Cognitive Status: Within Functional Limits for tasks assessed                                        General Comments      Exercises     Assessment/Plan    PT Assessment Patient needs continued PT services  PT Problem List Decreased strength;Decreased activity tolerance;Decreased safety awareness;Decreased balance;Decreased mobility;Cardiopulmonary status limiting activity;Decreased coordination       PT Treatment Interventions DME instruction;Balance training;Gait training;Stair training;Functional mobility training;Patient/family education;Therapeutic activities;Therapeutic exercise    PT Goals (Current goals can be found in the Care Plan section)  Acute Rehab PT Goals Patient Stated Goal: go home and get meds fixed PT Goal Formulation: With patient Time For Goal Achievement: 02/10/21 Potential to Achieve Goals: Fair    Frequency Min 3X/week   Barriers to discharge        Co-evaluation               AM-PAC PT "6 Clicks" Mobility  Outcome Measure Help needed turning from your back to your side while in a flat bed without using bedrails?: A Little Help needed moving from lying on your back to sitting on the side of a flat bed without using bedrails?: A Little Help needed moving to and from a bed to a chair (including a wheelchair)?: A Little Help needed standing up from a chair using your arms (e.g., wheelchair or bedside chair)?: A Little Help needed to walk in hospital room?: A Little Help needed climbing 3-5 steps with a railing? : A Lot 6 Click Score: 17    End of Session   Activity Tolerance: Patient tolerated treatment well Patient left: in bed;with call bell/phone within reach;Other (comment) (on ED  stretcher) Nurse Communication: Mobility status PT Visit Diagnosis: Muscle weakness (generalized) (M62.81);History of falling (Z91.81);Difficulty in walking, not elsewhere classified (R26.2)    Time: 2778-2423 PT Time Calculation (min) (ACUTE ONLY): 18 min   Charges:   PT Evaluation $PT Eval Moderate Complexity: 1 Mod         Windell Norfolk, DPT, PN1   Supplemental Physical Therapist [place]    Pager [phone] Acute Rehab Office [phone]

## 2021-01-27 NOTE — ED Notes (Signed)
Patient transported to MRI 

## 2021-01-27 NOTE — ED Notes (Signed)
Pt states she wants labs pulled from her IV line and not from venipuncture.

## 2021-01-27 NOTE — ED Provider Notes (Signed)
[facility] EMERGENCY DEPARTMENT Provider Note   CSN: [id number] Arrival date & time: 01/26/21  2119     History Chief Complaint  Patient presents with   Heidi [name] is a 70 y.o. female.  Patient with a history of COPD on home oxygen, diabetes, hypertension,, cholesterol, anxiety, COVID infection in February here with multiple complaints.  Her history is difficult to follow.  States she is here because she fell out of bed the morning of July 24.  States she was dreaming and pushed something in her dream and somehow fell to the ground landing on her knees. She Is not certain if she hit her head but is not sure.  States she has not felt right since the fall has been weak and having pain all over all day long.  States she has just been lying around at home feeling lightheaded feeling dizzy feeling like her blood sugar was dropping but it was over 500 when she checked it.  Believes that she took one of her daughter's Lortabs by mistake.  Denies trying to hurt herself.  States she has a headache which is not unusual for her but she gets Botox injections for chronic headaches last 1 was at the end of June.  States the headache she is having now is more severe than she is used to when she has had her Botox.  He is not sure if she hit her head.  Denies any weakness in her arms or legs.  No difficulty speaking or difficulty swallowing.  Complains of pain to her neck, back, chest, abdomen, bilateral knees. Does not take any blood thinners at home. No fevers, cough, runny nose or sore throat  The history is provided by the patient and a relative.  Fall Associated symptoms include headaches. Pertinent negatives include no chest pain and no shortness of breath.      Past Medical History:  Diagnosis Date   Adenomatous colon polyp    Anemia    Anxiety    Asthma    CHF (congestive heart failure) (HCC)    COPD (chronic obstructive pulmonary disease) ([place])    Diabetes  mellitus without complication (HCC)    High cholesterol    Hypertension    Left knee injury    Pneumonia    Supplemental oxygen dependent    2L [place]    Patient Active Problem List   Diagnosis Date Noted   Pneumonia due to COVID-19 virus 08/10/2020   Encephalopathy due to COVID-19 virus 08/10/2020   CAP (community acquired pneumonia) 08/10/2020   Acute respiratory failure with hypoxia and hypercapnia ([place]) 08/10/2020   Acute kidney injury superimposed on CKD ([place]) 08/10/2020   COVID 08/10/2020   Encounter for medication review and counseling 03/18/2020   Medication management 02/28/2020   Exertional dyspnea 01/03/2020   Multifocal pneumonia 11/24/2019   Acute respiratory failure ([place]) 11/24/2019   Breast nodule 11/24/2019   Chronic respiratory failure with hypoxia, on home oxygen therapy ([place]) 11/13/2019   Atypical chest pain 08/24/2019   Cervical myelopathy ([place]) 08/16/2018   Diabetic retinopathy of right eye associated with type 2 diabetes mellitus ([place]) 08/16/2018   Upper airway cough syndrome 05/03/2018   Morbid obesity due to excess calories ([place]) complicated by hbp/ dm / hyperlipidemia 09/10/2017   Cough variant asthma vs UACS/vcd on ACEi  09/09/2017   Anxiety and depression 09/06/2017   Mild persistent asthma without complication 78/29/5621   Paranoia ([place]) 09/06/2017   Torn  left ear lobe 11/20/2016   Chronic migraine w/o aura w/o status migrainosus, not intractable 06/27/2016   Post-menopausal atrophic vaginitis 11/14/2015   Primary osteoarthritis of both knees 07/18/2015   GERD (gastroesophageal reflux disease) 05/08/2015   Renal cyst 05/08/2015   Adenomatous polyp of colon 03/26/2015   Essential hypertension 02/12/2015   Cognitive deficit due to old head trauma 02/12/2015   Asthma, chronic 02/12/2015   Chronic left shoulder pain 02/11/2015   Thyroid nodule 12/18/2014   Chronic neck pain    HLD (hyperlipidemia)    OSA treated with BiPAP 11/27/2014   Obesity  hypoventilation syndrome ([place]) 11/27/2014   Demand ischemia ([place])    Uncontrolled type 2 diabetes mellitus with hyperglycemia ([place])    Cardiomegaly 11/25/2014   CHF (congestive heart failure) ([place]) 11/25/2014   DM type 2 causing CKD stage 2 ([place]) 11/25/2014    Past Surgical History:  Procedure Laterality Date   ABDOMINAL HYSTERECTOMY     CATARACT EXTRACTION Left    CATARACT EXTRACTION     LT 01/2017, 02/2017 on RT   CESAREAN SECTION       OB History   No obstetric history on file.     Family History  Problem Relation Age of Onset   Colon cancer Father    Diabetes type II Sister    Diabetes type II Brother    Colon cancer Sister    Migraines Neg Hx    Breast cancer Neg Hx     Social History   Tobacco Use   Smoking status: Never   Smokeless tobacco: Never  Vaping Use   Vaping Use: Never used  Substance Use Topics   Alcohol use: No   Drug use: No    Home Medications Prior to Admission medications   Medication Sig Start Date End Date Taking? Authorizing Provider  Accu-Chek FastClix Lancets MISC Use as directed to test blood sugar three times daily DX E11.65 04/10/20   Ladell Pier, MD  albuterol (PROVENTIL) (2.5 MG/3ML) 0.083% nebulizer solution USE 1 VIAL VIA NEBULIZER EVERY 6 HOURS AS NEEDED FOR WHEEZING OR SHORTNESS OF BREATH Patient taking differently: Take 2.5 mg by nebulization every 6 (six) hours as needed for wheezing or shortness of breath. 02/21/18   Ladell Pier, MD  albuterol (VENTOLIN HFA) 108 (90 Base) MCG/ACT inhaler Inhale 1-2 puffs into the lungs every 4 (four) hours as needed for wheezing or shortness of breath. 10/21/20   Spero Geralds, MD  aspirin 81 MG chewable tablet Chew 81 mg by mouth daily.     [provider]  atenolol (TENORMIN) 25 MG tablet TAKE 1 TABLET(25 MG) BY MOUTH DAILY 08/28/20   Freeman Caldron M, PA-C  atorvastatin (LIPITOR) 40 MG tablet TAKE 1 TABLET BY MOUTH DAILY AT 6 PM 12/11/20   Ladell Pier, MD  Blood  Glucose Monitoring Suppl (ACCU-CHEK GUIDE ME) w/Device KIT Use as directed to test blood sugar three times daily DX E11.65 04/10/20   Ladell Pier, MD  famotidine (PEPCID) 20 MG tablet Take 1 tablet (20 mg total) by mouth 2 (two) times daily. 01/14/21   Ladell Pier, MD  furosemide (LASIX) 40 MG tablet Take 1 tablet (40 mg total) by mouth daily. 08/19/20   Elgergawy, Silver Huguenin, MD  glucose blood (ACCU-CHEK GUIDE) test strip Use as directed to test blood sugar three times daily DX E11.65 04/10/20   Ladell Pier, MD  insulin aspart protamine- aspart (NOVOLOG MIX 70/30) (70-30) 100 UNIT/ML injection Inject  0.4 mLs (40 Units total) into the skin 2 (two) times daily with a meal. Inject 70 units in the morning and 45 units in the evening. 01/14/21   Ladell Pier, MD  INSULIN SYRINGE 1CC/29G 29G X 1/2" 1 ML MISC USE THREE TIMES DAILY AS DIRECTED 11/12/20   Ladell Pier, MD  loratadine (CLARITIN) 10 MG tablet Take 10 mg by mouth daily as needed for allergies.    [provider]  losartan (COZAAR) 50 MG tablet TAKE 1 TABLET(50 MG) BY MOUTH DAILY 10/07/20   Freeman Caldron M, PA-C  montelukast (SINGULAIR) 10 MG tablet Take 1 tablet (10 mg total) by mouth at bedtime. 03/18/20   Tanda Rockers, MD  Multiple Vitamin (MULTIVITAMIN WITH MINERALS) TABS tablet Take 1 tablet by mouth daily.    [provider]  nitroGLYCERIN (NITROSTAT) 0.3 MG SL tablet Place 1 tablet (0.3 mg total) under the tongue every 5 (five) minutes as needed for chest pain. Max 3 tablets in 15 minutes 08/30/19   Freeman Caldron M, PA-C  olopatadine (PATANOL) 0.1 % ophthalmic solution Place 1 drop into both eyes 2 (two) times daily. Patient taking differently: Place 1 drop into both eyes 2 (two) times daily as needed for allergies. 08/26/19   Asencion Noble, MD  pantoprazole (PROTONIX) 40 MG tablet TAKE 1 TABLET(40 MG) BY MOUTH DAILY 12/11/20   Ladell Pier, MD  Potassium Chloride ER 20 MEQ TBCR TAKE 1  TABLET BY MOUTH DAILY Patient taking differently: Take 20 mEq by mouth daily. 01/30/20   Ladell Pier, MD  tetrahydrozoline-zinc (VISINE-AC) 0.05-0.25 % ophthalmic solution Place 1 drop into both eyes 3 (three) times daily as needed.    [provider]  topiramate (TOPAMAX) 25 MG capsule TAKE 3 CAPSULES(75 MG) BY MOUTH TWICE DAILY 01/14/21   Ladell Pier, MD  traMADol (ULTRAM) 50 MG tablet Take 1 tablet (50 mg total) by mouth every 8 (eight) hours as needed (Prescription must last 1 month). 01/14/21   Ladell Pier, MD  Fexofenadine HCl [facility] ALLERGY PO) Take 1 tablet by mouth daily as needed (for allergy).  08/01/20  [provider]    Allergies    Januvia [sitagliptin]  Review of Systems   Review of Systems  Constitutional:  Positive for fatigue. Negative for activity change, appetite change and fever.  Eyes:  Negative for visual disturbance.  Respiratory:  Negative for chest tightness and shortness of breath.   Cardiovascular:  Negative for chest pain.  Gastrointestinal:  Negative for nausea and vomiting.  Musculoskeletal:  Positive for arthralgias, back pain, myalgias and neck pain.  Skin:  Negative for rash.  Neurological:  Positive for dizziness, weakness, light-headedness and headaches.   all other systems are negative except as noted in the HPI and PMH.   Physical Exam Updated Vital Signs BP (!) 105/91 (BP Location: Right Arm)   Pulse 85   Temp 98.6 F (37 C) (Oral)   Resp 20   SpO2 100%   Physical Exam Vitals and nursing note reviewed.  Constitutional:      General: She is not in acute distress.    Appearance: She is well-developed.  HENT:     Head: Normocephalic and atraumatic.     Mouth/Throat:     Pharynx: No oropharyngeal exudate.  Eyes:     Conjunctiva/sclera: Conjunctivae normal.     Pupils: Pupils are equal, round, and reactive to light.  Neck:     Comments: Diffuse paraspinal tenderness Cardiovascular:  Rate and  Rhythm: Normal rate and regular rhythm.     Heart sounds: Normal heart sounds. No murmur heard. Pulmonary:     Effort: Pulmonary effort is normal. No respiratory distress.     Breath sounds: Normal breath sounds.  Abdominal:     Palpations: Abdomen is soft.     Tenderness: There is no abdominal tenderness. There is no guarding or rebound.  Musculoskeletal:        General: Tenderness present. Normal range of motion.     Cervical back: Normal range of motion and neck supple.     Comments: Pelvis is stable.  Tenderness to anterior knees bilaterally.  Range of motion intact.  No deformities.  Skin:    General: Skin is warm.  Neurological:     Mental Status: She is alert and oriented to person, place, and time.     Cranial Nerves: No cranial nerve deficit.     Motor: No abnormal muscle tone.     Coordination: Coordination normal.     Comments: CN 2-12 intact, no ataxia on finger to nose, no nystagmus, 5/5 strength throughout, no pronator drift, Romberg negative.   Psychiatric:        Behavior: Behavior normal.    ED Results / Procedures / Treatments   Labs (all labs ordered are listed, but only abnormal results are displayed) Labs Reviewed  COMPREHENSIVE METABOLIC PANEL - Abnormal; Notable for the following components:      Result Value   Glucose, Bld 414 (*)    Total Protein 6.1 (*)    Albumin 3.4 (*)    Total Bilirubin 0.2 (*)    All other components within normal limits  URINALYSIS, ROUTINE W REFLEX MICROSCOPIC - Abnormal; Notable for the following components:   Glucose, UA >=500 (*)    All other components within normal limits  ACETAMINOPHEN LEVEL - Abnormal; Notable for the following components:   Acetaminophen (Tylenol), Serum <10 (*)    All other components within normal limits  SALICYLATE LEVEL - Abnormal; Notable for the following components:   Salicylate Lvl <6.8 (*)    All other components within normal limits  RAPID URINE DRUG SCREEN, HOSP PERFORMED - Abnormal;  Notable for the following components:   Opiates POSITIVE (*)    All other components within normal limits  AMMONIA - Abnormal; Notable for the following components:   Ammonia 37 (*)    All other components within normal limits  CBG MONITORING, ED - Abnormal; Notable for the following components:   Glucose-Capillary 389 (*)    All other components within normal limits  I-STAT VENOUS BLOOD GAS, ED - Abnormal; Notable for the following components:   pCO2, Ven 64.8 (*)    pO2, Ven 151.0 (*)    Bicarbonate 30.9 (*)    TCO2 33 (*)    All other components within normal limits  CBG MONITORING, ED - Abnormal; Notable for the following components:   Glucose-Capillary 258 (*)    All other components within normal limits  RESP PANEL BY RT-PCR (FLU A&B, COVID) ARPGX2  CBC WITH DIFFERENTIAL/PLATELET  ETHANOL  CK  TROPONIN I (HIGH SENSITIVITY)  TROPONIN I (HIGH SENSITIVITY)    EKG None  Radiology DG Chest 2 View  Result Date: 01/27/2021 CLINICAL DATA:  Fall EXAM: CHEST - 2 VIEW COMPARISON:  09/13/2020 FINDINGS: Multifocal nodular airspace opacity bilaterally. No pleural effusion or pneumothorax. Normal cardiomediastinal contours. IMPRESSION: Multifocal nodular airspace opacity bilaterally, concerning for multifocal pneumonia. Followup PA and lateral chest X-ray  is recommended in 3-4 weeks to ensure resolution and exclude underlying malignancy. Electronically Signed   By: Ulyses Jarred M.D.   On: 01/27/2021 02:41   CT Head Wo Contrast  Result Date: 01/27/2021 CLINICAL DATA:  Head trauma, mod-severe; Polytrauma, critical, head/C-spine injury suspected EXAM: CT HEAD WITHOUT CONTRAST CT CERVICAL SPINE WITHOUT CONTRAST TECHNIQUE: Multidetector CT imaging of the head and cervical spine was performed following the standard protocol without intravenous contrast. Multiplanar CT image reconstructions of the cervical spine were also generated. COMPARISON:  Head CT 08/10/2020 FINDINGS: CT HEAD FINDINGS  Brain: There is no mass, hemorrhage or extra-axial collection. The size and configuration of the ventricles and extra-axial CSF spaces are normal. The brain parenchyma is normal, without evidence of acute or chronic infarction. Vascular: No abnormal hyperdensity of the major intracranial arteries or dural venous sinuses. No intracranial atherosclerosis. Skull: The visualized skull base, calvarium and extracranial soft tissues are normal. Sinuses/Orbits: No fluid levels or advanced mucosal thickening of the visualized paranasal sinuses. No mastoid or middle ear effusion. The orbits are normal. CT CERVICAL SPINE FINDINGS Alignment: No static subluxation. Facets are aligned. Occipital condyles are normally positioned. Skull base and vertebrae: No acute fracture. Soft tissues and spinal canal: No prevertebral fluid or swelling. No visible canal hematoma. Disc levels: No advanced spinal canal or neural foraminal stenosis. Upper chest: No pneumothorax, pulmonary nodule or pleural effusion. Other: Normal visualized paraspinal cervical soft tissues. IMPRESSION: 1. No acute intracranial abnormality. 2. No acute fracture or static subluxation of the cervical spine. Electronically Signed   By: Ulyses Jarred M.D.   On: 01/27/2021 02:46   CT Cervical Spine Wo Contrast  Result Date: 01/27/2021 CLINICAL DATA:  Head trauma, mod-severe; Polytrauma, critical, head/C-spine injury suspected EXAM: CT HEAD WITHOUT CONTRAST CT CERVICAL SPINE WITHOUT CONTRAST TECHNIQUE: Multidetector CT imaging of the head and cervical spine was performed following the standard protocol without intravenous contrast. Multiplanar CT image reconstructions of the cervical spine were also generated. COMPARISON:  Head CT 08/10/2020 FINDINGS: CT HEAD FINDINGS Brain: There is no mass, hemorrhage or extra-axial collection. The size and configuration of the ventricles and extra-axial CSF spaces are normal. The brain parenchyma is normal, without evidence of  acute or chronic infarction. Vascular: No abnormal hyperdensity of the major intracranial arteries or dural venous sinuses. No intracranial atherosclerosis. Skull: The visualized skull base, calvarium and extracranial soft tissues are normal. Sinuses/Orbits: No fluid levels or advanced mucosal thickening of the visualized paranasal sinuses. No mastoid or middle ear effusion. The orbits are normal. CT CERVICAL SPINE FINDINGS Alignment: No static subluxation. Facets are aligned. Occipital condyles are normally positioned. Skull base and vertebrae: No acute fracture. Soft tissues and spinal canal: No prevertebral fluid or swelling. No visible canal hematoma. Disc levels: No advanced spinal canal or neural foraminal stenosis. Upper chest: No pneumothorax, pulmonary nodule or pleural effusion. Other: Normal visualized paraspinal cervical soft tissues. IMPRESSION: 1. No acute intracranial abnormality. 2. No acute fracture or static subluxation of the cervical spine. Electronically Signed   By: Ulyses Jarred M.D.   On: 01/27/2021 02:46   DG Knee Complete 4 Views Left  Result Date: 01/27/2021 CLINICAL DATA:  Fall EXAM: LEFT KNEE - COMPLETE 4+ VIEW COMPARISON:  None. FINDINGS: No evidence of fracture, dislocation, or joint effusion. No evidence of arthropathy or other focal bone abnormality. Soft tissues are unremarkable. IMPRESSION: Negative. Electronically Signed   By: Ulyses Jarred M.D.   On: 01/27/2021 02:38   DG Knee Complete 4 Views Right  Result Date: 01/27/2021 CLINICAL DATA:  Fall EXAM: RIGHT KNEE - COMPLETE 4+ VIEW COMPARISON:  None. FINDINGS: No evidence of fracture, dislocation, or joint effusion. No evidence of arthropathy or other focal bone abnormality. Soft tissues are unremarkable. IMPRESSION: Negative. Electronically Signed   By: Ulyses Jarred M.D.   On: 01/27/2021 02:38    Procedures Procedures   Medications Ordered in ED Medications - No data to display  ED Course  I have reviewed the  triage vital signs and the nursing notes.  Pertinent labs & imaging results that were available during my care of the patient were reviewed by me and considered in my medical decision making (see chart for details).    MDM Rules/Calculators/A&P                          Fall from bed this morning now with feeling sore all over, dizzy, lightheaded, not quite right all day. Her speech is mildly slow but she otherwise appears intact.  Complains of pain all over.  She is concerned the type of headache she is having is unusual for her when she has had her Botox.  Is uncertain if she hit her head during the fall.  EKG sinus.  CBG 414, normal anion gap.  CT head and C-spine are negative.  Knee x-rays are negative.  Patient states that she still feels like something is wrong and she feels like she is having head and neck pain that she does not normally have and feels like she was off balance.  Her daughter states she was confused and told her that she took the ambulance here which was not true.  Patient able to ambulate.  No desaturation on her home oxygen.  Her chest x-ray does show evidence of previous multifocal pneumonia.  This may be leftover when she had COVID in February.  Denies any new cough or fever.  Daughter states she is not acting like herself and she feels like her speech is different and she feels that patient is still confused.  Hyperglycemia improving with IV fluids and insulin.  CTA obtained at family's request to evaluate for aneurysm which is negative.  Family still feels patient is off balance and not acting like herself. Think her speech is off and that she is having difficulty walking.  MRI will be obtained for further evaluation to rule out acute ischemic event.  Remainder traumatic imaging is negative.  Patient's lung abnormalities not appreciated on CT scan today without evidence of large pulmonary embolism though CT study is suboptimal.  Do not suspect her minimal  hypercarbia to be contributing to her altered mental status.  This may be due to her taking the pain medication at home that was not hers.  Remainder of toxicology labs are unremarkable.  Anion gap is normal.  Observation admission discussed with Dr. Hal Hope   Final Clinical Impression(s) / ED Diagnoses Final diagnoses:  None    Rx / DC Orders ED Discharge Orders     None        Rancour, Annie Main, MD 01/27/21 [phone]

## 2021-01-27 NOTE — ED Notes (Signed)
Pt was able to ambulate, O2 remained at 97% on her home O2 of 3 liters.  Pt became very tired but was able ambulate in hall and back to bed.

## 2021-01-27 NOTE — ED Notes (Signed)
Provider bedside.

## 2021-01-27 NOTE — Progress Notes (Signed)
SLP Cancellation Note  Patient Details Name: Kyndell Paraiso MRN: [id number] DOB: April 14, 1951   Cancelled treatment:       Reason Eval/Treat Not Completed: SLP addressed cognitive linguistic eval with a screen, no needs identified, will sign off for cognitive linguistic therapy. Recommend swallow eval as pt reports frequent coughing   Kaye Luoma, Katherene Ponto 01/27/2021, 2:46 PM

## 2021-01-27 NOTE — ED Notes (Signed)
Pt requested pain medication.  Returned to room for administration and noted pt to be sleeping.  Will re-evaluate need when pt awake

## 2021-01-27 NOTE — ED Notes (Signed)
PT at bedside with patient

## 2021-01-27 NOTE — Progress Notes (Signed)
Eval complete, documentation pending. Would benefit from HHPT and RW f/u at DC.   Windell Norfolk, DPT, PN1   Supplemental Physical Therapist [facility]    Pager [phone] Acute Rehab Office [phone]

## 2021-01-28 DIAGNOSIS — W19XXXA Unspecified fall, initial encounter: Secondary | ICD-10-CM | POA: Diagnosis not present

## 2021-01-28 DIAGNOSIS — Y92009 Unspecified place in unspecified non-institutional (private) residence as the place of occurrence of the external cause: Secondary | ICD-10-CM | POA: Diagnosis not present

## 2021-01-28 DIAGNOSIS — I10 Essential (primary) hypertension: Secondary | ICD-10-CM | POA: Diagnosis not present

## 2021-01-28 LAB — LIPID PANEL
Cholesterol: 137 mg/dL (ref 0–200)
HDL: 52 mg/dL (ref >40)
LDL Cholesterol: 55 mg/dL (ref 0–99)
Total CHOL/HDL Ratio: 2.6 RATIO
Triglycerides: 151 mg/dL — ABNORMAL HIGH (ref <150)
VLDL: 30 mg/dL (ref 0–40)

## 2021-01-28 LAB — CBG MONITORING, ED: Glucose-Capillary: 319 mg/dL — ABNORMAL HIGH (ref 70–99)

## 2021-01-28 LAB — HIV ANTIBODY (ROUTINE TESTING W REFLEX): HIV Screen 4th Generation wRfx: NONREACTIVE

## 2021-01-28 NOTE — ED Notes (Signed)
Insulin given with sandwich.

## 2021-01-28 NOTE — ED Notes (Signed)
Pt declines pain med at this time stating she "will wait a while"  Coffee given per request

## 2021-01-28 NOTE — ED Notes (Signed)
Breakfast Order Placed ?

## 2021-01-28 NOTE — ED Notes (Signed)
Pt called out requesting towels and sheets.  Found pt with puddle of urine at end of soaked bed.  Educated pt to please stop self d/c cardiac monitor and ambulating to bathroom without assistance after pt states "I couldn't get the wires off fast enough"  Pt floor and bed cleaned and fresh sheets applied.  Pt reconnected to cardiac monitor

## 2021-01-28 NOTE — Discharge Summary (Signed)
Physician Discharge Summary  Heidi [name] LFY:[id number] DOB: 10-21-1950 DOA: 01/26/2021  PCP: Ladell Pier, MD  Admit date: 01/26/2021 Discharge date: 01/28/2021    Admitted From: Home Disposition: Home  Recommendations for Outpatient Follow-up:  Follow up with PCP in 1-2 weeks Please obtain BMP/CBC in one week Please follow up with your PCP on the following pending results: Unresulted Labs (From admission, onward)     Start     Ordered   01/28/21 0500  HIV Antibody (routine testing w rflx)  (HIV Antibody (Routine testing w reflex) panel)  Tomorrow morning,   R        01/27/21 0918   01/28/21 0500  Hemoglobin A1c  (Labs)  Tomorrow morning,   R        01/27/21 0918              Home Health: Yes Equipment/Devices: Rolling walker  Discharge Condition: Stable CODE STATUS: Full code Diet recommendation: Cardiac  Subjective: Seen and examined.  Still complains of headache.  She is convinced and obsessed that she has something wrong in her head that has not been discovered yet.  Details below.  Following HPI and ED course has been copied from my colleague hospitalist Dr. Lorin Mercy H&P. HPI: Heidi [name] is a 70 y.o. female with medical history significant of chronic diastolic CHF; COPD on 2L home O2; DM; HTN; and HLD presenting with a fall out of bed on 7/24. She reports that at about 3-4 AM on 7/24, she fell out of bed.  She was previously on Lortab but her PCP here has been prescribing Tramadol and this causes her to have vivid dreams and nightmares.  She has chronic headaches due to an accident in 2016; she takes Botox for this.  She is currently having headache different from her usual (and she recently got Botox) as well as diffuse myalgias in her neck, arms, and back.  "It just did something" when she fell and she is certain something is wrong with her.  She also has chronic neck pain and is due for a steroid shot in her neck, but she reports that the current neck  pain is worse than her usual.  She wears 2L home O2 and has had mild SOB the last few days despite the O2.     ED Course: [place]ryover, per Dr. Hal Hope:   70 year old female with history of COPD, diabetes mellitus, sleep apnea was brought into the ER patient was found to be confused by patient's daughter.  Also actually had taken 1 dose of Lortab of the patient's daughters.  Chest x-ray shows multifocal pneumonia.  So far work-up is negative MRI brain and CT angiogram of the chest is pending.  Brief/Interim Summary: Patient was basically hospitalized per patient's request just for observation.  Patient did not have any indication or diagnosis that merited hospitalization.  Her chest x-ray shows bilateral opacities but when reviewed and compared with previous chest x-rays, this is chronic.  She has no signs or symptoms of pneumonia, no chest pain, no shortness of breath no cough and no leukocytosis.  Thorough work-up was done which included bilateral knee x-ray, CT head, CT cervical spine, CT angio head with and without contrast, CT angio neck with and without contrast, CT angio chest as well as MRI brain and she was ruled out of any acute pathology, no stroke, no brain hemorrhage, no PE, no stenosis.  Despite of this, patient was only admitted because she requested to be  observed.  When seen this morning, patient once again seems to be obsessed that she has some sort of bleeding in her brain and that she has been hurting in the brain.  However, her headache is very chronic for which she even gets Botox.  She even gets pain medications and has lately been taking tramadol and this is all prescribed to her by her pain medication doctors.  She is once again requesting to keep her here and she believes that there is a head bleed which has yet not discovered on multiple studies including MRI and CT head.  She was counseled that MRI is very sensitive and that we do not find any acute pathology or bleeding.  She is  fully alert and oriented and has no focal deficit on examination which once again reassurance that brain imaging studies are accurate.  Despite of extensive time spent with the patient and extensive counseling, she continues to reiterate and repeat herself asking similar questions.  She was informed that since there is no further indication to continue hospitalization, she is going to be discharged home.  PT OT recommended home health which we are going to order.  She needs to continue all her medications.  She was requesting to prescribe her pain medications however I told her that in the face of her taking opioids resulting in falls at home, it is not recommended that she takes opioids.  She already has established relationship with her PCP and pain medication doctor and I recommended her to reach out to them to discuss this matter with them.  Obviously, she is not happy to hear all this.   Discharge Diagnoses:  Principal Problem:   Fall at home, initial encounter Active Problems:   Uncontrolled type 2 diabetes mellitus with hyperglycemia ([place])   Essential hypertension   Morbid obesity due to excess calories ([place]) complicated by hbp/ dm / hyperlipidemia   Acute encephalopathy    Discharge Instructions   Allergies as of 01/28/2021       Reactions   Januvia [sitagliptin]    Chest pains        Medication List     TAKE these medications    Accu-Chek FastClix Lancets Misc Use as directed to test blood sugar three times daily DX E11.65 What changed:  how much to take how to take this when to take this   Accu-Chek Guide Me w/Device Kit Use as directed to test blood sugar three times daily DX E11.65 What changed:  how much to take how to take this when to take this   Accu-Chek Guide test strip Generic drug: glucose blood Use as directed to test blood sugar three times daily DX E11.65 What changed:  how much to take how to take this when to take this   albuterol (2.5  MG/3ML) 0.083% nebulizer solution Commonly known as: PROVENTIL USE 1 VIAL VIA NEBULIZER EVERY 6 HOURS AS NEEDED FOR WHEEZING OR SHORTNESS OF BREATH What changed:  how much to take how to take this when to take this reasons to take this additional instructions   albuterol 108 (90 Base) MCG/ACT inhaler Commonly known as: VENTOLIN HFA Inhale 1-2 puffs into the lungs every 4 (four) hours as needed for wheezing or shortness of breath. What changed: Another medication with the same name was changed. Make sure you understand how and when to take each.   aspirin 81 MG chewable tablet Chew 81 mg by mouth daily.   atenolol 25 MG tablet Commonly known  as: TENORMIN TAKE 1 TABLET(25 MG) BY MOUTH DAILY What changed: See the new instructions.   atorvastatin 40 MG tablet Commonly known as: LIPITOR TAKE 1 TABLET BY MOUTH DAILY AT 6 PM What changed: when to take this   famotidine 20 MG tablet Commonly known as: Pepcid Take 1 tablet (20 mg total) by mouth 2 (two) times daily.   fluticasone-salmeterol 115-21 MCG/ACT inhaler Commonly known as: ADVAIR HFA Inhale 2 puffs into the lungs 2 (two) times daily.   furosemide 40 MG tablet Commonly known as: LASIX Take 1 tablet (40 mg total) by mouth daily.   insulin aspart protamine- aspart (70-30) 100 UNIT/ML injection Commonly known as: NOVOLOG MIX 70/30 Inject 0.4 mLs (40 Units total) into the skin 2 (two) times daily with a meal. Inject 70 units in the morning and 45 units in the evening.   INSULIN SYRINGE 1CC/29G 29G X 1/2" 1 ML Misc USE THREE TIMES DAILY AS DIRECTED What changed:  how much to take how to take this when to take this additional instructions   loratadine 10 MG tablet Commonly known as: CLARITIN Take 10 mg by mouth daily as needed for allergies.   losartan 50 MG tablet Commonly known as: COZAAR TAKE 1 TABLET(50 MG) BY MOUTH DAILY What changed: See the new instructions.   montelukast 10 MG tablet Commonly known as:  SINGULAIR Take 1 tablet (10 mg total) by mouth at bedtime.   multivitamin with minerals Tabs tablet Take 1 tablet by mouth daily.   nitroGLYCERIN 0.3 MG SL tablet Commonly known as: NITROSTAT Place 1 tablet (0.3 mg total) under the tongue every 5 (five) minutes as needed for chest pain. Max 3 tablets in 15 minutes   olopatadine 0.1 % ophthalmic solution Commonly known as: PATANOL Place 1 drop into both eyes 2 (two) times daily. What changed:  when to take this reasons to take this   pantoprazole 40 MG tablet Commonly known as: PROTONIX TAKE 1 TABLET(40 MG) BY MOUTH DAILY What changed: See the new instructions.   Potassium Chloride ER 20 MEQ Tbcr TAKE 1 TABLET BY MOUTH DAILY What changed: how much to take   tetrahydrozoline-zinc 0.05-0.25 % ophthalmic solution Commonly known as: VISINE-AC Place 1 drop into both eyes 3 (three) times daily as needed (dry / irritated eyes).   topiramate 25 MG capsule Commonly known as: TOPAMAX TAKE 3 CAPSULES(75 MG) BY MOUTH TWICE DAILY What changed:  how much to take how to take this when to take this additional instructions   traMADol 50 MG tablet Commonly known as: ULTRAM Take 1 tablet (50 mg total) by mouth every 8 (eight) hours as needed (Prescription must last 1 month).        Follow-up Information     Ladell Pier, MD Follow up in 1 week(s).   Specialty: Internal Medicine Contact information: [place] [address] [phone]                Allergies  Allergen Reactions   Januvia [Sitagliptin]     Chest pains    Consultations: None   Procedures/Studies: CT Angio Head W or Wo Contrast  Result Date: 01/27/2021 CLINICAL DATA:  Acute stroke suspected EXAM: CT ANGIOGRAPHY HEAD AND NECK TECHNIQUE: Multidetector CT imaging of the head and neck was performed using the standard protocol during bolus administration of intravenous contrast. Multiplanar CT image reconstructions and MIPs were  obtained to evaluate the vascular anatomy. Carotid stenosis measurements (when applicable) are obtained utilizing NASCET criteria,  using the distal internal carotid diameter as the denominator. CONTRAST:  173m OMNIPAQUE IOHEXOL 350 MG/ML SOLN COMPARISON:  Noncontrast head CT from earlier today FINDINGS: CTA NECK FINDINGS Aortic arch: Negative.  Three vessel branching Right carotid system: Vessels are smooth and widely patent with no atheromatous changes Left carotid system: Vessels are smooth and widely patent with no atheromatous changes. Vertebral arteries: No proximal subclavian or vertebral stenosis. No vessel beading Skeleton: Mid to lower cervical disc degeneration. Benign heterogeneity/sclerosis of the C5 vertebral body. Other neck: 12 mm left thyroid nodule. No followup recommended (ref: J Am Coll Radiol. 2015 Feb;12(2): 143-50). Upper chest: Negative Review of the MIP images confirms the above findings CTA HEAD FINDINGS Anterior circulation: Atheromatous calcification of the carotid siphon walls. No flow limiting stenosis or branch occlusion of major vessels. Negative for aneurysm or beading. Posterior circulation: Vessels are smooth and widely patent with no aneurysm or beading. Venous sinuses: Diffusely patent. Anatomic variants: Hypoplastic right A1 segment. Review of the MIP images confirms the above findings IMPRESSION: No emergent finding.  Mild atherosclerosis for age. Electronically Signed   By: JMonte FantasiaM.D.   On: 01/27/2021 05:41   DG Chest 2 View  Result Date: 01/27/2021 CLINICAL DATA:  Fall EXAM: CHEST - 2 VIEW COMPARISON:  09/13/2020 FINDINGS: Multifocal nodular airspace opacity bilaterally. No pleural effusion or pneumothorax. Normal cardiomediastinal contours. IMPRESSION: Multifocal nodular airspace opacity bilaterally, concerning for multifocal pneumonia. Followup PA and lateral chest X-ray is recommended in 3-4 weeks to ensure resolution and exclude underlying malignancy.  Electronically Signed   By: KUlyses JarredM.D.   On: 01/27/2021 02:41   CT Head Wo Contrast  Result Date: 01/27/2021 CLINICAL DATA:  Head trauma, mod-severe; Polytrauma, critical, head/C-spine injury suspected EXAM: CT HEAD WITHOUT CONTRAST CT CERVICAL SPINE WITHOUT CONTRAST TECHNIQUE: Multidetector CT imaging of the head and cervical spine was performed following the standard protocol without intravenous contrast. Multiplanar CT image reconstructions of the cervical spine were also generated. COMPARISON:  Head CT 08/10/2020 FINDINGS: CT HEAD FINDINGS Brain: There is no mass, hemorrhage or extra-axial collection. The size and configuration of the ventricles and extra-axial CSF spaces are normal. The brain parenchyma is normal, without evidence of acute or chronic infarction. Vascular: No abnormal hyperdensity of the major intracranial arteries or dural venous sinuses. No intracranial atherosclerosis. Skull: The visualized skull base, calvarium and extracranial soft tissues are normal. Sinuses/Orbits: No fluid levels or advanced mucosal thickening of the visualized paranasal sinuses. No mastoid or middle ear effusion. The orbits are normal. CT CERVICAL SPINE FINDINGS Alignment: No static subluxation. Facets are aligned. Occipital condyles are normally positioned. Skull base and vertebrae: No acute fracture. Soft tissues and spinal canal: No prevertebral fluid or swelling. No visible canal hematoma. Disc levels: No advanced spinal canal or neural foraminal stenosis. Upper chest: No pneumothorax, pulmonary nodule or pleural effusion. Other: Normal visualized paraspinal cervical soft tissues. IMPRESSION: 1. No acute intracranial abnormality. 2. No acute fracture or static subluxation of the cervical spine. Electronically Signed   By: KUlyses JarredM.D.   On: 01/27/2021 02:46   CT Angio Neck W and/or Wo Contrast  Result Date: 01/27/2021 CLINICAL DATA:  Acute stroke suspected EXAM: CT ANGIOGRAPHY HEAD AND NECK  TECHNIQUE: Multidetector CT imaging of the head and neck was performed using the standard protocol during bolus administration of intravenous contrast. Multiplanar CT image reconstructions and MIPs were obtained to evaluate the vascular anatomy. Carotid stenosis measurements (when applicable) are obtained utilizing NASCET criteria, using the distal  internal carotid diameter as the denominator. CONTRAST:  141m OMNIPAQUE IOHEXOL 350 MG/ML SOLN COMPARISON:  Noncontrast head CT from earlier today FINDINGS: CTA NECK FINDINGS Aortic arch: Negative.  Three vessel branching Right carotid system: Vessels are smooth and widely patent with no atheromatous changes Left carotid system: Vessels are smooth and widely patent with no atheromatous changes. Vertebral arteries: No proximal subclavian or vertebral stenosis. No vessel beading Skeleton: Mid to lower cervical disc degeneration. Benign heterogeneity/sclerosis of the C5 vertebral body. Other neck: 12 mm left thyroid nodule. No followup recommended (ref: J Am Coll Radiol. 2015 Feb;12(2): 143-50). Upper chest: Negative Review of the MIP images confirms the above findings CTA HEAD FINDINGS Anterior circulation: Atheromatous calcification of the carotid siphon walls. No flow limiting stenosis or branch occlusion of major vessels. Negative for aneurysm or beading. Posterior circulation: Vessels are smooth and widely patent with no aneurysm or beading. Venous sinuses: Diffusely patent. Anatomic variants: Hypoplastic right A1 segment. Review of the MIP images confirms the above findings IMPRESSION: No emergent finding.  Mild atherosclerosis for age. Electronically Signed   By: JMonte FantasiaM.D.   On: 01/27/2021 05:41   CT Angio Chest PE W and/or Wo Contrast  Result Date: 01/27/2021 CLINICAL DATA:  Positive D-dimer with pulmonary embolism suspected. EXAM: CT ANGIOGRAPHY CHEST WITH CONTRAST TECHNIQUE: Multidetector CT imaging of the chest was performed using the standard  protocol during bolus administration of intravenous contrast. Multiplanar CT image reconstructions and MIPs were obtained to evaluate the vascular anatomy. CONTRAST:  1090mOMNIPAQUE IOHEXOL 350 MG/ML SOLN COMPARISON:  08/10/2020 FINDINGS: Cardiovascular: Cardiomegaly. No pericardial effusion. Scattered atheromatous calcification of the aorta which is mild. Suboptimal pulmonary artery bolus density exacerbated by streak artifact and soft tissue attenuation. Today scan is similar if not even more suboptimal than the comparison. No pulmonary emboli are seen. Mediastinum/Nodes: Negative for adenopathy or mass Lungs/Pleura: Mass streaky opacity at the lung bases with volume loss. No edema, effusion, or pneumothorax. Upper Abdomen: No acute finding. Partially covered cyst at the upper pole left kidney measuring 7.3 cm. Low-density liver which may be from contrast timing Musculoskeletal: No acute finding generalized degenerative endplate spurring. Review of the MIP images confirms the above findings. IMPRESSION: 1. Suboptimal pulmonary artery opacification similar to attempt earlier this year. No evidence of pulmonary embolism to the lobar/segmental level. 2. Cardiomegaly. 3. Atelectasis. Electronically Signed   By: JoMonte Fantasia.D.   On: 01/27/2021 05:45   CT Cervical Spine Wo Contrast  Result Date: 01/27/2021 CLINICAL DATA:  Head trauma, mod-severe; Polytrauma, critical, head/C-spine injury suspected EXAM: CT HEAD WITHOUT CONTRAST CT CERVICAL SPINE WITHOUT CONTRAST TECHNIQUE: Multidetector CT imaging of the head and cervical spine was performed following the standard protocol without intravenous contrast. Multiplanar CT image reconstructions of the cervical spine were also generated. COMPARISON:  Head CT 08/10/2020 FINDINGS: CT HEAD FINDINGS Brain: There is no mass, hemorrhage or extra-axial collection. The size and configuration of the ventricles and extra-axial CSF spaces are normal. The brain parenchyma is  normal, without evidence of acute or chronic infarction. Vascular: No abnormal hyperdensity of the major intracranial arteries or dural venous sinuses. No intracranial atherosclerosis. Skull: The visualized skull base, calvarium and extracranial soft tissues are normal. Sinuses/Orbits: No fluid levels or advanced mucosal thickening of the visualized paranasal sinuses. No mastoid or middle ear effusion. The orbits are normal. CT CERVICAL SPINE FINDINGS Alignment: No static subluxation. Facets are aligned. Occipital condyles are normally positioned. Skull base and vertebrae: No acute fracture. Soft tissues and  spinal canal: No prevertebral fluid or swelling. No visible canal hematoma. Disc levels: No advanced spinal canal or neural foraminal stenosis. Upper chest: No pneumothorax, pulmonary nodule or pleural effusion. Other: Normal visualized paraspinal cervical soft tissues. IMPRESSION: 1. No acute intracranial abnormality. 2. No acute fracture or static subluxation of the cervical spine. Electronically Signed   By: Ulyses Jarred M.D.   On: 01/27/2021 02:46   MR BRAIN WO CONTRAST  Result Date: 01/27/2021 CLINICAL DATA:  Neuro deficit, acute, stroke suspected EXAM: MRI HEAD WITHOUT CONTRAST TECHNIQUE: Multiplanar, multiecho pulse sequences of the brain and surrounding structures were obtained without intravenous contrast. COMPARISON:  08/10/2020 FINDINGS: Brain: There is no acute infarction or intracranial hemorrhage. There is no intracranial mass, mass effect, or edema. There is no hydrocephalus or extra-axial fluid collection. Patchy and confluent areas of T2 hyperintensity in the supratentorial and pontine white matter are nonspecific but probably reflect moderate chronic microvascular ischemic changes. Ventricles and sulci are stable in size and configuration. Vascular: Major vessel flow voids at the skull base are preserved. Skull and upper cervical spine: Normal marrow signal is preserved. Sinuses/Orbits:  Paranasal sinuses are aerated. Bilateral lens replacements. Other: Sella is unremarkable.  Mastoid air cells are clear. IMPRESSION: No evidence of recent infarction, hemorrhage, or mass. Moderate chronic microvascular ischemic changes. Electronically Signed   By: Macy Mis M.D.   On: 01/27/2021 10:37   DG Knee Complete 4 Views Left  Result Date: 01/27/2021 CLINICAL DATA:  Fall EXAM: LEFT KNEE - COMPLETE 4+ VIEW COMPARISON:  None. FINDINGS: No evidence of fracture, dislocation, or joint effusion. No evidence of arthropathy or other focal bone abnormality. Soft tissues are unremarkable. IMPRESSION: Negative. Electronically Signed   By: Ulyses Jarred M.D.   On: 01/27/2021 02:38   DG Knee Complete 4 Views Right  Result Date: 01/27/2021 CLINICAL DATA:  Fall EXAM: RIGHT KNEE - COMPLETE 4+ VIEW COMPARISON:  None. FINDINGS: No evidence of fracture, dislocation, or joint effusion. No evidence of arthropathy or other focal bone abnormality. Soft tissues are unremarkable. IMPRESSION: Negative. Electronically Signed   By: Ulyses Jarred M.D.   On: 01/27/2021 02:38     Discharge Exam: Vitals:   01/28/21 0700 01/28/21 0950  BP: 132/73 (!) 143/71  Pulse: 95 93  Resp: (!) 22 18  Temp:    SpO2: 100% 100%   Vitals:   01/28/21 0452 01/28/21 0600 01/28/21 0700 01/28/21 0950  BP: (!) 128/50 134/84 132/73 (!) 143/71  Pulse: 93 (!) 101 95 93  Resp:  (!) 23 (!) 22 18  Temp:      TempSrc:      SpO2: 93% 97% 100% 100%  Weight:        General: Pt is alert, awake, not in acute distress Cardiovascular: RRR, S1/S2 +, no rubs, no gallops Respiratory: CTA bilaterally, no wheezing, no rhonchi Abdominal: Soft, NT, ND, bowel sounds + Extremities: no edema, no cyanosis    The results of significant diagnostics from this hospitalization (including imaging, microbiology, ancillary and laboratory) are listed below for reference.     Microbiology: Recent Results (from the past 240 hour(s))  Resp Panel by  RT-PCR (Flu A&B, Covid) Nasopharyngeal Swab     Status: None   Collection Time: 01/27/21  2:56 AM   Specimen: Nasopharyngeal Swab; Nasopharyngeal(NP) swabs in vial transport medium  Result Value Ref Range Status   SARS Coronavirus 2 by RT PCR NEGATIVE NEGATIVE Final    Comment: (NOTE) SARS-CoV-2 target nucleic acids are NOT DETECTED.  The SARS-CoV-2 RNA is generally detectable in upper respiratory specimens during the acute phase of infection. The lowest concentration of SARS-CoV-2 viral copies this assay can detect is 138 copies/mL. A negative result does not preclude SARS-Cov-2 infection and should not be used as the sole basis for treatment or other patient management decisions. A negative result may occur with  improper specimen collection/handling, submission of specimen other than nasopharyngeal swab, presence of viral mutation(s) within the areas targeted by this assay, and inadequate number of viral copies(<138 copies/mL). A negative result must be combined with clinical observations, patient history, and epidemiological information. The expected result is Negative.  Fact Sheet for Patients:  EntrepreneurPulse.com.au  Fact Sheet for Healthcare Providers:  IncredibleEmployment.be  This test is no t yet approved or cleared by the [place] FDA and  has been authorized for detection and/or diagnosis of SARS-CoV-2 by FDA under an Emergency Use Authorization (EUA). This EUA will remain  in effect (meaning this test can be used) for the duration of the COVID-19 declaration under Section 564(b)(1) of the Act, 21 U.S.C.section 360bbb-3(b)(1), unless the authorization is terminated  or revoked sooner.       Influenza A by PCR NEGATIVE NEGATIVE Final   Influenza B by PCR NEGATIVE NEGATIVE Final    Comment: (NOTE) The Xpert Xpress SARS-CoV-2/FLU/RSV plus assay is intended as an aid in the diagnosis of influenza from Nasopharyngeal swab  specimens and should not be used as a sole basis for treatment. Nasal washings and aspirates are unacceptable for Xpert Xpress SARS-CoV-2/FLU/RSV testing.  Fact Sheet for Patients: EntrepreneurPulse.com.au  Fact Sheet for Healthcare Providers: IncredibleEmployment.be  This test is not yet approved or cleared by the [place] FDA and has been authorized for detection and/or diagnosis of SARS-CoV-2 by FDA under an Emergency Use Authorization (EUA). This EUA will remain in effect (meaning this test can be used) for the duration of the COVID-19 declaration under Section 564(b)(1) of the Act, 21 U.S.C. section 360bbb-3(b)(1), unless the authorization is terminated or revoked.  Performed at [facility], [place] [address], [place], [place] [address]      Labs: BNP (last 3 results) Recent Labs    08/14/20 0129 08/15/20 0146 08/16/20 0133  BNP 130.4* 31.1 60.4   Basic Metabolic Panel: Recent Labs  Lab 01/26/21 2317 01/27/21 0314  NA 135 140  K 4.0 4.0  CL 101  --   CO2 26  --   GLUCOSE 414*  --   BUN 8  --   CREATININE 0.95  --   CALCIUM 9.0  --    Liver Function Tests: Recent Labs  Lab 01/26/21 2317  AST 16  ALT 18  ALKPHOS 119  BILITOT 0.2*  PROT 6.1*  ALBUMIN 3.4*   No results for input(s): LIPASE, AMYLASE in the last 168 hours. Recent Labs  Lab 01/27/21 0222  AMMONIA 37*   CBC: Recent Labs  Lab 01/26/21 2317 01/27/21 0314  WBC 7.2  --   NEUTROABS 4.1  --   HGB 12.5 14.3  HCT 41.6 42.0  MCV 92.7  --   PLT 196  --    Cardiac Enzymes: Recent Labs  Lab 01/27/21 0222  CKTOTAL 223   BNP: Invalid input(s): POCBNP CBG: Recent Labs  Lab 01/26/21 2308 01/27/21 0548 01/27/21 1113 01/27/21 1634 01/27/21 2234  GLUCAP 389* 258* 216* 272* 302*   D-Dimer No results for input(s): DDIMER in the last 72 hours. Hgb A1c No results for input(s): HGBA1C in the  last 72 hours. Lipid Profile Recent Labs     01/28/21 0523  CHOL 137  HDL 52  LDLCALC 55  TRIG 151*  CHOLHDL 2.6   Thyroid function studies No results for input(s): TSH, T4TOTAL, T3FREE, THYROIDAB in the last 72 hours.  Invalid input(s): FREET3 Anemia work up No results for input(s): VITAMINB12, FOLATE, FERRITIN, TIBC, IRON, RETICCTPCT in the last 72 hours. Urinalysis    Component Value Date/Time   COLORURINE YELLOW 01/26/2021 2308   APPEARANCEUR CLEAR 01/26/2021 2308   LABSPEC 1.023 01/26/2021 2308   PHURINE 6.0 01/26/2021 2308   GLUCOSEU >=500 (A) 01/26/2021 2308   HGBUR NEGATIVE 01/26/2021 2308   BILIRUBINUR NEGATIVE 01/26/2021 2308   BILIRUBINUR Negative 05/12/2016 1703   KETONESUR NEGATIVE 01/26/2021 2308   PROTEINUR NEGATIVE 01/26/2021 2308   UROBILINOGEN 0.2 04/25/2019 1508   NITRITE NEGATIVE 01/26/2021 2308   LEUKOCYTESUR NEGATIVE 01/26/2021 2308   Sepsis Labs Invalid input(s): PROCALCITONIN,  WBC,  LACTICIDVEN Microbiology Recent Results (from the past 240 hour(s))  Resp Panel by RT-PCR (Flu A&B, Covid) Nasopharyngeal Swab     Status: None   Collection Time: 01/27/21  2:56 AM   Specimen: Nasopharyngeal Swab; Nasopharyngeal(NP) swabs in vial transport medium  Result Value Ref Range Status   SARS Coronavirus 2 by RT PCR NEGATIVE NEGATIVE Final    Comment: (NOTE) SARS-CoV-2 target nucleic acids are NOT DETECTED.  The SARS-CoV-2 RNA is generally detectable in upper respiratory specimens during the acute phase of infection. The lowest concentration of SARS-CoV-2 viral copies this assay can detect is 138 copies/mL. A negative result does not preclude SARS-Cov-2 infection and should not be used as the sole basis for treatment or other patient management decisions. A negative result may occur with  improper specimen collection/handling, submission of specimen other than nasopharyngeal swab, presence of viral mutation(s) within the areas targeted by this assay, and inadequate number of viral copies(<138  copies/mL). A negative result must be combined with clinical observations, patient history, and epidemiological information. The expected result is Negative.  Fact Sheet for Patients:  EntrepreneurPulse.com.au  Fact Sheet for Healthcare Providers:  IncredibleEmployment.be  This test is no t yet approved or cleared by the [place] FDA and  has been authorized for detection and/or diagnosis of SARS-CoV-2 by FDA under an Emergency Use Authorization (EUA). This EUA will remain  in effect (meaning this test can be used) for the duration of the COVID-19 declaration under Section 564(b)(1) of the Act, 21 U.S.C.section 360bbb-3(b)(1), unless the authorization is terminated  or revoked sooner.       Influenza A by PCR NEGATIVE NEGATIVE Final   Influenza B by PCR NEGATIVE NEGATIVE Final    Comment: (NOTE) The Xpert Xpress SARS-CoV-2/FLU/RSV plus assay is intended as an aid in the diagnosis of influenza from Nasopharyngeal swab specimens and should not be used as a sole basis for treatment. Nasal washings and aspirates are unacceptable for Xpert Xpress SARS-CoV-2/FLU/RSV testing.  Fact Sheet for Patients: EntrepreneurPulse.com.au  Fact Sheet for Healthcare Providers: IncredibleEmployment.be  This test is not yet approved or cleared by the [place] FDA and has been authorized for detection and/or diagnosis of SARS-CoV-2 by FDA under an Emergency Use Authorization (EUA). This EUA will remain in effect (meaning this test can be used) for the duration of the COVID-19 declaration under Section 564(b)(1) of the Act, 21 U.S.C. section 360bbb-3(b)(1), unless the authorization is terminated or revoked.  Performed at [facility], [place] [address] [place], [place] [address]  Time coordinating discharge: Over 30 minutes  SIGNED:   Darliss Cheney, MD  [facility] 01/28/2021, 10:23 AM  If  7PM-7AM, please contact night-coverage [email or web address]

## 2021-01-28 NOTE — Progress Notes (Signed)
  Speech Language Pathology Treatment: Dysphagia  Patient Details Name: Evalette Montrose MRN: [id number] DOB: 12/23/1950 Today's Date: 01/28/2021 Time: 0388-8280 SLP Time Calculation (min) (ACUTE ONLY): 15 min  Assessment / Plan / Recommendation Clinical Impression  Visited pt at bedside, again observed he eat and drink without difficulty. She reported that she almost had a sensation of choking with meal this am, but didn't. SLP reviewed helpful strategies that can address mild esophageal and pharyngeal dysphagia given that pts symptoms are infrequent and mild and there is not any concern for current pna. Pt return demonstrated. Encouraged pt to f/u with GI is symptoms worsen given that complaints sound mostly esophageal. All education complete.   HPI HPI: 70 year old female with history of COPD, diabetes mellitus, sleep apnea was brought into the ER patient was found to be confused by patient's daughter.  Chest x-ray shows multifocal pneumonia but CT result shows no pna. MRi negative for acute infarct      SLP Plan  All goals met       Recommendations  Diet recommendations: Regular;Thin liquid Liquids provided via: Cup;Straw Medication Administration: Whole meds with liquid Supervision: Patient able to self feed Compensations: Slow rate;Small sips/bites;Follow solids with liquid Postural Changes and/or Swallow Maneuvers: Seated upright 90 degrees;Upright 30-60 min after meal                Plan: All goals met       GO                Nimo Verastegui, Katherene Ponto 01/28/2021, 11:02 AM

## 2021-01-29 ENCOUNTER — Telehealth: Payer: Self-pay | Admitting: Neurology

## 2021-01-29 LAB — HEMOGLOBIN A1C
Hgb A1c MFr Bld: 10.3 % — ABNORMAL HIGH (ref 4.8–5.6)
Mean Plasma Glucose: 249 mg/dL

## 2021-01-29 NOTE — Telephone Encounter (Signed)
Called pt and she was in Ed for fall, after having a dream and falling from her bed.  She received norco/vicodin when in the hospital and noted that her headache pain relieved with taking this and wanted to see about getting prescription for this.  I told her that it was a narcotic and we do not prescribe she may need to reach out to her pcpc who gives her tramadol.  She mentioned she see or has seen PM.  I see we treat her migraines with BOTOX.  She wanted me to ask.

## 2021-01-29 NOTE — Telephone Encounter (Signed)
Pt is needing to discuss her medications and reactions that she is having with the RN. Please advise.

## 2021-01-30 ENCOUNTER — Other Ambulatory Visit: Payer: Self-pay

## 2021-01-30 ENCOUNTER — Encounter: Payer: Medicare HMO | Attending: Physical Medicine & Rehabilitation | Admitting: Physical Medicine & Rehabilitation

## 2021-01-30 VITALS — BP 119/64 | HR 81 | Temp 97.4°F | Ht 64.0 in | Wt 235.6 lb

## 2021-01-30 DIAGNOSIS — M542 Cervicalgia: Secondary | ICD-10-CM | POA: Diagnosis not present

## 2021-01-30 MED ORDER — METHOCARBAMOL 500 MG PO TABS
500.0000 mg | ORAL_TABLET | Freq: Three times a day (TID) | ORAL | 0 refills | Status: DC | PRN
Start: 2021-01-30 — End: 2022-01-28

## 2021-01-30 NOTE — Telephone Encounter (Signed)
I called and relayed VM that Dr. Jaynee Eagles stated opioids not prescribed for headache management. She would not prescribed for you and does not recommend that for her.  She is to call back if questions.

## 2021-01-30 NOTE — Progress Notes (Signed)
Subjective:    Patient ID: Heidi [name], female    DOB: June 12, 1951, 70 y.o.   MRN: [id number] Referral by PCP for PM&R evaluation HPI 70 yo female referred by PCP for the evaluation of cervical pain  Used to get injections by Dr Nelva Bush.  She would lay on her stomach and injection would be in back of neck.  The patient did not recall the exact location of her injections.  She did have good pain relief with these and what ever medicine he was prescribing seem to be helping as well.  The patient states that she quit going to the office because of [place].  The patient states that it was far away also however we discussed that this office is only 3 miles from his office.  Sees Neurology , Dr Lavell Anchors for chronic migraines and receives Botox injections every 3 months  [place] out of bed 3 days ago after taking tramadol, pt states she felt dizzy, was seen in ED with extensive work up MRI brain Chronic SVD but no acute abnormaility  CT head and CTA normal CT C spine unramarkable  Bilateral knee xray normal Ethanol neg Pain Inventory Average Pain 9 Pain Right Now 9 My pain is constant  In the last 24 hours, has pain interfered with the following? General activity 9 Relation with others 2 Enjoyment of life 0 What TIME of day is your pain at its worst? daytime and evening Sleep (in general) Fair  Pain is worse with: walking, bending, and standing Pain improves with: therapy/exercise Relief from Meds: 2  use a cane use a walker do you drive?  yes use a wheelchair  what is your job? 2001 disabled: date disabled 2007 I need assistance with the following:  dressing and bathing  tremor trouble walking  Any changes since last visit?  yes  Any changes since last visit?  yes hospital    Family History  Problem Relation Age of Onset   Colon cancer Father    Diabetes type II Sister    Diabetes type II Brother    Colon cancer Sister    Migraines Neg Hx    Breast cancer Neg Hx     Social History   Socioeconomic History   Marital status: Widowed    Spouse name: Not on file   Number of children: 4   Years of education: Not on file   Highest education level: Not on file  Occupational History   Occupation: Disabled  Tobacco Use   Smoking status: Never   Smokeless tobacco: Never  Vaping Use   Vaping Use: Never used  Substance and Sexual Activity   Alcohol use: No   Drug use: No   Sexual activity: Not on file  Other Topics Concern   Not on file  Social History Narrative   Lives with daughter   Caffeine use: 1 cup coffee per day   Social Determinants of Health   Financial Resource Strain: Not on file  Food Insecurity: Not on file  Transportation Needs: Not on file  Physical Activity: Not on file  Stress: Not on file  Social Connections: Not on file   Past Surgical History:  Procedure Laterality Date   ABDOMINAL HYSTERECTOMY     CATARACT EXTRACTION Left    CATARACT EXTRACTION     LT 01/2017, 02/2017 on RT   CESAREAN SECTION     Past Medical History:  Diagnosis Date   Adenomatous colon polyp    Anemia  Anxiety    Asthma    CHF (congestive heart failure) (HCC)    COPD (chronic obstructive pulmonary disease) (HCC)    Diabetes mellitus without complication (HCC)    High cholesterol    Hypertension    Left knee injury    Pneumonia    Supplemental oxygen dependent    2L [place]   BP 119/64 (BP Location: Left Arm, Patient Position: Sitting, Cuff Size: Large)   Pulse 81   Temp (!) 97.4 F (36.3 C) (Oral)   Ht '5\' 4"'$  (1.626 m)   Wt 235 lb 9.6 oz (106.9 kg)   SpO2 90%   BMI 40.44 kg/m   Opioid Risk Score:   Fall Risk Score:  `1  Depression screen PHQ 2/9  Depression screen [facility] 2/9 01/30/2021 08/30/2020 05/14/2020 12/14/2019 11/13/2019 08/30/2019 08/22/2018  Decreased Interest 0 0 1 - 0 0 0  Down, Depressed, Hopeless 0 0 0 - 0 0 0  PHQ - 2 Score 0 0 1 - 0 0 0  Altered sleeping 1 - - - - 0 -  Tired, decreased energy 1 - - - - 0 -  Change in  appetite 1 - - - - 0 -  Feeling bad or failure about yourself  0 - - - - 0 -  Trouble concentrating 1 - - - - 0 -  Moving slowly or fidgety/restless 1 - - - - 0 -  Suicidal thoughts 0 - - 0 - 0 -  PHQ-9 Score 5 - - - - 0 -  Difficult doing work/chores Not difficult at all - - - - - -  Some recent data might be hidden     Review of Systems  Constitutional:  Positive for chills. Negative for fever.  Respiratory:  Positive for shortness of breath and wheezing.   Musculoskeletal:  Positive for gait problem and neck pain.  Neurological:  Positive for tremors.  All other systems reviewed and are negative.     Objective:   Physical Exam Vitals and nursing note reviewed.  Constitutional:      Appearance: She is obese.  HENT:     Head: Normocephalic and atraumatic.     Mouth/Throat:     Mouth: Mucous membranes are dry.  Eyes:     Extraocular Movements: Extraocular movements intact.     Conjunctiva/sclera: Conjunctivae normal.     Pupils: Pupils are equal, round, and reactive to light.  Cardiovascular:     Rate and Rhythm: Normal rate and regular rhythm.  Pulmonary:     Effort: Pulmonary effort is normal. No respiratory distress.     Breath sounds: Normal breath sounds.  Abdominal:     General: Abdomen is flat. There is no distension.     Palpations: Abdomen is soft.     Tenderness: There is no abdominal tenderness.  Musculoskeletal:     Cervical back: Neck supple. No tenderness.  Skin:    General: Skin is warm and dry.  Neurological:     Mental Status: She is alert and oriented to person, place, and time.  Psychiatric:        Mood and Affect: Mood normal.        Behavior: Behavior normal.   Motor strength is 5/5 bilateral deltoid, bicep, tricep, grip, hip flexor, knee extensor ankle dorsiflexor and plantar flexor Sensation normal bilateral upper and lower limbs to light touch Negative straight leg raising bilaterally Ambulates without assistive device no evidence of toe  drag or knee instability  There  is no tenderness palpation along the cervical, thoracic or lumbar paraspinal area or along the spinous processes.  There is mild tenderness around the left sternocleidomastoid muscle there is no mass or erythema in that area.        Assessment & Plan:  #1.  Cervicalgia, history of migraine headaches.  Also has been treated in the past by Dr. Herma Mering.  Do not have records to see what type of injections were done. We discussed that her imaging studies showed no acute abnormalities and in the cervical spine only some mild degenerative changes We discussed recommendations of physical therapy which she declines she states it did not help her in the past or in fact made her pain worse We discussed trial of chiropractic which patient has not tried and does not want to pursue She is interested in trying the medicine they gave her in the hospital.  We discussed that given some intolerance of tramadol, hydrocodone on a long-term basis would likely cause more dizziness.  In addition she really does not have any significant radiologic findings or any significant exam findings to warrant long-term scheduled II opioids. Did recommend the patient go back to Dr. Nelva Bush for follow-up. Wrote a prescription for methocarbamol 500 mg 3 times daily as needed, if this is helpful for her one of her other physicians can prescribe this on a as needed basis.  We will not make follow-up appointment.

## 2021-01-30 NOTE — Telephone Encounter (Signed)
Pt has called back, the message from [place], [place] was read to her.  Pt stated she understands the message but states since Dr Jaynee Eagles in her Neurologist what other options would she suggest, please call.

## 2021-01-30 NOTE — Patient Instructions (Signed)
Do not recommend hydrocodone Would recommend follow up with Dr Nelva Bush to resume prior treatment for neck pain, I do not do same procedures Also recommend follow up with Dr Lavell Anchors for chronic headache If muscle relaxers are helpful, one of your other MDs can prescribe

## 2021-02-04 NOTE — Telephone Encounter (Signed)
I spoke with patient to discuss her appointment.  She actually stated she no longer needs the additional appointment with Dr Jaynee Eagles that was previously scheduled for 02/17/21 and it has been canceled. She stated she would see Dr Jaynee Eagles at her next Botox appt on 04/07/21. She also stated she understands Dr Jaynee Eagles would not prescribe the opioid. Pt was advised to please call back if she changes her mind about the appt to discuss other suggestions. She verbalized understanding and appreciation for the call.

## 2021-02-04 NOTE — Telephone Encounter (Signed)
Sure

## 2021-02-06 ENCOUNTER — Other Ambulatory Visit: Payer: Self-pay | Admitting: Pharmacist

## 2021-02-06 MED ORDER — INSULIN ASPART PROT & ASPART (70-30 MIX) 100 UNIT/ML ~~LOC~~ SUSP: SUBCUTANEOUS | 2 refills | Status: DC

## 2021-02-07 ENCOUNTER — Ambulatory Visit (HOSPITAL_COMMUNITY): Payer: Medicare HMO

## 2021-02-10 ENCOUNTER — Inpatient Hospital Stay: Payer: Medicare HMO

## 2021-02-10 ENCOUNTER — Other Ambulatory Visit: Payer: Self-pay

## 2021-02-10 ENCOUNTER — Ambulatory Visit: Payer: Medicare HMO | Admitting: Cardiology

## 2021-02-10 ENCOUNTER — Encounter: Payer: Self-pay | Admitting: Cardiology

## 2021-02-10 VITALS — BP 138/60 | HR 79 | Temp 98.3°F | Ht 64.0 in | Wt 231.6 lb

## 2021-02-10 DIAGNOSIS — R42 Dizziness and giddiness: Secondary | ICD-10-CM

## 2021-02-10 DIAGNOSIS — R55 Syncope and collapse: Secondary | ICD-10-CM | POA: Diagnosis not present

## 2021-02-10 DIAGNOSIS — R0789 Other chest pain: Secondary | ICD-10-CM | POA: Diagnosis not present

## 2021-02-10 NOTE — Progress Notes (Signed)
Patient referred by Ladell Pier, MD for chest pain  Subjective:   Heidi [name], female    DOB: April 22, 1951, 70 y.o.   MRN: [id number]   Chief Complaint  Patient presents with   Chest Pain    HPI  70 y.o. African American female with hypertension, type 2 DM with retinopathy, COPD, hyperlipidemia, CKD stage III, OSA, obesity, breast nodule, chronic migraines  Patient made an urgent appointment today with multitude of complaints.  Primarily, she is having episodes where she feels like she is going to pass out.  Episodes come and go with no specific aggravating or relieving factor.  She denies any chest pain or new shortness of breath symptoms.  She is on supplemental oxygen for baseline COPD.  Patient was recently hospitalized for similar complaints.  Elaborate work-up did not reveal any significant abnormalities, but patient remained convinced that there is "something wrong with my head".  She is convinced that the tramadol is mixing with her other medications to cause her nightmares.  Current Outpatient Medications on File Prior to Visit  Medication Sig Dispense Refill   Accu-Chek FastClix Lancets MISC Use as directed to test blood sugar three times daily DX E11.65 102 each 6   albuterol (PROVENTIL) (2.5 MG/3ML) 0.083% nebulizer solution USE 1 VIAL VIA NEBULIZER EVERY 6 HOURS AS NEEDED FOR WHEEZING OR SHORTNESS OF BREATH 25 vial 0   albuterol (VENTOLIN HFA) 108 (90 Base) MCG/ACT inhaler Inhale 1-2 puffs into the lungs every 4 (four) hours as needed for wheezing or shortness of breath. 8 g 5   aspirin 81 MG chewable tablet Chew 81 mg by mouth daily.      atenolol (TENORMIN) 25 MG tablet TAKE 1 TABLET(25 MG) BY MOUTH DAILY 90 tablet 1   atorvastatin (LIPITOR) 40 MG tablet TAKE 1 TABLET BY MOUTH DAILY AT 6 PM 90 tablet 0   Blood Glucose Monitoring Suppl (ACCU-CHEK GUIDE ME) w/Device KIT Use as directed to test blood sugar three times daily DX E11.65 1 kit 0   famotidine (PEPCID)  20 MG tablet Take 1 tablet (20 mg total) by mouth 2 (two) times daily. 60 tablet 4   fluticasone-salmeterol (ADVAIR HFA) 115-21 MCG/ACT inhaler Inhale 2 puffs into the lungs 2 (two) times daily.     furosemide (LASIX) 40 MG tablet Take 1 tablet (40 mg total) by mouth daily. 30 tablet 3   glucose blood (ACCU-CHEK GUIDE) test strip Use as directed to test blood sugar three times daily DX E11.65 100 each 6   insulin aspart protamine- aspart (NOVOLOG MIX 70/30) (70-30) 100 UNIT/ML injection Inject 70 units in the morning and 45 units in the evening. 10 mL 2   INSULIN SYRINGE 1CC/29G 29G X 1/2" 1 ML MISC USE THREE TIMES DAILY AS DIRECTED 100 each 3   loratadine (CLARITIN) 10 MG tablet Take 10 mg by mouth daily as needed for allergies.     losartan (COZAAR) 50 MG tablet TAKE 1 TABLET(50 MG) BY MOUTH DAILY 90 tablet 3   methocarbamol (ROBAXIN) 500 MG tablet Take 1 tablet (500 mg total) by mouth every 8 (eight) hours as needed for muscle spasms. 90 tablet 0   montelukast (SINGULAIR) 10 MG tablet Take 1 tablet (10 mg total) by mouth at bedtime. 90 tablet 2   Multiple Vitamin (MULTIVITAMIN WITH MINERALS) TABS tablet Take 1 tablet by mouth daily.     nitroGLYCERIN (NITROSTAT) 0.3 MG SL tablet Place 1 tablet (0.3 mg total) under the tongue every  5 (five) minutes as needed for chest pain. Max 3 tablets in 15 minutes 30 tablet 1   olopatadine (PATANOL) 0.1 % ophthalmic solution Place 1 drop into both eyes 2 (two) times daily. 5 mL 0   pantoprazole (PROTONIX) 40 MG tablet TAKE 1 TABLET(40 MG) BY MOUTH DAILY 90 tablet 0   Potassium Chloride ER 20 MEQ TBCR TAKE 1 TABLET BY MOUTH DAILY 90 tablet 3   tetrahydrozoline-zinc (VISINE-AC) 0.05-0.25 % ophthalmic solution Place 1 drop into both eyes 3 (three) times daily as needed (dry / irritated eyes).     topiramate (TOPAMAX) 25 MG capsule TAKE 3 CAPSULES(75 MG) BY MOUTH TWICE DAILY 180 capsule 1   [DISCONTINUED] Fexofenadine HCl (MUCINEX ALLERGY PO) Take 1 tablet by  mouth daily as needed (for allergy).     No current facility-administered medications on file prior to visit.    Cardiovascular and other pertinent studies:  EKG 02/10/2021: Sinus rhythm 80 npm Leftward axis Poor R wave progression No acute ischemic changes  EKG 01/02/2021: Sinus rhythm 85 bpm  Poor R-wave progression Cannot exclude old anteroseptal infarct Low voltage -possible pulmonary disease  Lexiscan Tetrofosmin stress test 12/09/2019: Lexiscan nuclear stress test performed using 1-day protocol. Stress EKG is non-diagnostic, as this is pharmacological stress test using Lexiscan. Myocardial perfusion imaging is normal. Stress LVEF 74% Low risk study.    CT Chest 11/24/2019: 1. No evidence for pulmonary embolus. 2. Cardiomegaly with hazy bilateral ground-glass airspace opacities primarily at the lung bases, suggestive of an atypical infectious process such as viral pneumonia. 3. Scattered bilateral breast nodules measuring up to approximately 8 mm on the left. Correlation with outpatient mammography is recommended. 4. Partially calcified 1.5 cm left-sided thyroid nodule. Follow-up with a nonemergent outpatient thyroid ultrasound is recommended for further evaluation if this has not already been performed.(Ref: J Am Coll Radiol. 2015 Feb;12(2): 143-50).  Echocardiogram 08/25/2019: 1. Left ventricular ejection fraction, by estimation, is 65 to 70%. The left ventricle has normal function.  LV endocardial border not optimally defined to evaluate regional wall motion. There is mild concentric left ventricular hypertrophy.  Indeterminate diastolic filling due to E-A fusion.  2. Right ventricular systolic function is normal. The right ventricular size is mildly enlarged.  Tricuspid regurgitation signal is inadequate for assessing PA pressure.  3. The mitral valve is grossly normal. No evidence of mitral valve regurgitation. No evidence of mitral stenosis.  4. The aortic valve is  grossly normal. Aortic valve regurgitation is not visualized. No aortic stenosis is present.   CT Chest 04/09/2015: 1. Mild airspace opacity within the left lower lung lobe may reflect atelectasis or possibly mild residual pneumonia.  Minimal atelectasis at the medial aspect of the right middle lobe. Lungs otherwiseclear. 2. Scattered coronary artery calcification seen. 3. Left renal cyst, with mild associated peripheral calcification.  Cardiac catheterization 2014 [facility], [place]): 1. Angiographically normal coronaries 2. LVEDP 20 mm Hg (mildly increased)   Recent labs: 08/30/2020: Glucose 58, BUN/Cr 11/1.16. EGFR 56. Na/K 147/4.2. ALT 35. Rest of the CMP normal H/H 13/41. MCV 88. Platelets 128 HbA1C 9.9% Chol 138, TG 164, HDL 57, LDL 54 TSH N/A  08/25/2019-11/13/2019: Glucose 82, BUN/Cr 14/1.14. EGFR 57. Na/K 146/4.6. Alkaline Phospatase 122. H/H 14/46.3. MCV 88.5. Platelets 185. HbA1C 10.5% TSH 1.977 normal BNP 23.  HS Trop 3 X2  06/14/2019: Chol 118, TG 148, HDL 56, LDL 37     Review of Systems  Cardiovascular:  Positive for dyspnea on exertion (Chronic, stable). Negative for chest  pain, leg swelling, palpitations and syncope.  Neurological:  Positive for dizziness and light-headedness.       Vitals:   02/10/21 1538  BP: 138/60  Pulse: 79  Temp: 98.3 F (36.8 C)  SpO2: 93%    Body mass index is 39.75 kg/m. Filed Weights   02/10/21 1538  Weight: 231 lb 9.6 oz (105.1 kg)   Orthostatic VS for the past 72 hrs (Last 3 readings):  Orthostatic BP Patient Position BP Location Cuff Size Orthostatic Pulse  02/10/21 1558 122/62 Standing Left Arm Large 83  02/10/21 1556 119/63 Sitting Left Arm Large 76  02/10/21 1554 124/65 Supine Left Arm Large 82     Objective:   Physical Exam Vitals and nursing note reviewed.  Constitutional:      General: She is not in acute distress. Neck:     Vascular: No JVD.  Cardiovascular:     Rate and Rhythm: Normal rate  and regular rhythm.     Heart sounds: Normal heart sounds. No murmur heard. Pulmonary:     Effort: Pulmonary effort is normal.     Breath sounds: Normal breath sounds. No wheezing or rales.  Musculoskeletal:     Right lower leg: No edema.     Left lower leg: No edema.        Assessment & Recommendations:    70 y.o. African American female with hypertension, type 2 DM with retinopathy, COPD, hyperlipidemia, CKD stage III, OSA, obesity, breast nodule, chronic migraines  Multiple nonspecific symptoms.  Normal resting EKG.  No ischemia.  Given her presyncopal episodes, recommend 1 week telemetry.  Since her symptoms having 3-4 times daily, I expect to see some arrhythmia, should the symptoms be correlated with arrhythmia.  If no significant arrhythmias noted, her symptoms are not cardiac in origin.  In absence of known coronary artery disease, okay to stop aspirin.  F/u as needed   Nigel Mormon, MD [facility] [place] Pager: [phone] Office: [phone]

## 2021-02-11 ENCOUNTER — Ambulatory Visit (HOSPITAL_COMMUNITY)
Admission: RE | Admit: 2021-02-11 | Discharge: 2021-02-11 | Disposition: A | Discharge status: Home / Self Care | Payer: Medicare HMO | Source: Ambulatory Visit | Attending: Internal Medicine | Admitting: Internal Medicine

## 2021-02-11 DIAGNOSIS — E041 Nontoxic single thyroid nodule: Secondary | ICD-10-CM | POA: Insufficient documentation

## 2021-02-12 NOTE — Progress Notes (Signed)
Let patient know that the nodule seen in her left thyroid gland has remained stable in size and appearance. This is good.

## 2021-02-17 ENCOUNTER — Ambulatory Visit: Payer: Self-pay | Admitting: Neurology

## 2021-02-19 ENCOUNTER — Telehealth: Payer: Self-pay

## 2021-02-19 NOTE — Telephone Encounter (Signed)
Pt was called and a VM was left informing patient of lab results. 

## 2021-02-19 NOTE — Telephone Encounter (Signed)
-----   Message from Ladell Pier, MD sent at 02/12/2021 11:16 AM EDT ----- Let patient know that the nodule seen in her left thyroid gland has remained stable in size and appearance. This is good.

## 2021-02-21 DIAGNOSIS — R42 Dizziness and giddiness: Secondary | ICD-10-CM | POA: Diagnosis not present

## 2021-02-21 DIAGNOSIS — R55 Syncope and collapse: Secondary | ICD-10-CM | POA: Diagnosis not present

## 2021-02-22 DIAGNOSIS — I10 Essential (primary) hypertension: Secondary | ICD-10-CM | POA: Diagnosis not present

## 2021-02-22 DIAGNOSIS — J45909 Unspecified asthma, uncomplicated: Secondary | ICD-10-CM | POA: Diagnosis not present

## 2021-02-25 DIAGNOSIS — R55 Syncope and collapse: Secondary | ICD-10-CM | POA: Diagnosis not present

## 2021-02-25 DIAGNOSIS — R42 Dizziness and giddiness: Secondary | ICD-10-CM | POA: Diagnosis not present

## 2021-02-26 ENCOUNTER — Ambulatory Visit: Payer: Self-pay | Admitting: *Deleted

## 2021-02-26 NOTE — Telephone Encounter (Signed)
C/o "passing out" , fainting since leaving hospital for similar reasons. Fainted 2 days ago  while inside of home , she was getting ready to take the dog outside for a walk. C/o "blacked out" and lasted a few seconds. Patient can not recall. Family assisted patient up and patient feels she may have hist her head. Not lumps or bumps reported on head. C/o headaches but denies dizziness or lightheadedness at this time. Reports she started shaking all over after passing out. And had urgency to urinate and /or defecate. Checked her blood glucose and it was 104. Bp has been running 120/70's. . Reports she has been drinking enough fluids. Denies chest pain, difficulty breathing, no fever, no heart palpitations. Alert and oriented x 3 . Family with patient . Care advise given. Appt already scheduled for 03/07/21. Patient verbalized understanding of care advise and to call back or go to ED if symptoms worsen.

## 2021-02-26 NOTE — Telephone Encounter (Signed)
Reason for Disposition  Simple fainting is a chronic symptom (has occurred multiple times)  Answer Assessment - Initial Assessment Questions 1. ONSET: "How long were you unconscious?" (minutes) "When did it happen?"     Not sure, a few seconds  2. CONTENT: "What happened during period of unconsciousness?" (e.g., seizure activity)      Did not last long fell and family assisted her to stand . Urgency noted after fainting to urinate and/or deficate 3. MENTAL STATUS: "Alert and oriented now?" (oriented x 3 = name, month, location)      Yes  4. TRIGGER: "What do you think caused the fainting?" "What were you doing just before you fainted?"  (e.g., exercise, sudden standing up, prolonged standing)     Not sure no triggers noted  5. RECURRENT SYMPTOM: "Have you ever passed out before?" If Yes, ask: "When was the last time?" and "What happened that time?"      Yes 2 days ago . Just "passed out " 6. INJURY: "Did you sustain any injury during the fall?"      Not sure thinks she may have hit her head.  7. CARDIAC SYMPTOMS: "Have you had any of the following symptoms: chest pain, difficulty breathing, palpitations?"     No  8. NEUROLOGIC SYMPTOMS: "Have you had any of the following symptoms: headache, numbness, vertigo, weakness?"     Headaches on top lront side of head 9. GI SYMPTOMS: "Have you had any of the following symptoms: abdominal pain, vomiting, diarrhea, blood in stools?"     No  10. OTHER SYMPTOMS: "Do you have any other symptoms?"       No  11. PREGNANCY: "Is there any chance you are pregnant?" "When was your last menstrual period?"       na  Protocols used: Fainting-A-AH

## 2021-02-26 NOTE — Telephone Encounter (Signed)
FYI

## 2021-02-27 NOTE — Telephone Encounter (Signed)
Please contact pt and make sooner appt

## 2021-02-27 NOTE — Telephone Encounter (Signed)
Spoke with patient to offer sooner appt. Patient stated she will keep appt on Sept 2. She only want to see Dr. Wynetta Emery

## 2021-02-27 NOTE — Progress Notes (Signed)
Called and spoke with patient regarding her heart monitor results.

## 2021-03-01 ENCOUNTER — Other Ambulatory Visit: Payer: Self-pay | Admitting: Internal Medicine

## 2021-03-01 ENCOUNTER — Other Ambulatory Visit: Payer: Self-pay | Admitting: Physician Assistant

## 2021-03-01 DIAGNOSIS — I10 Essential (primary) hypertension: Secondary | ICD-10-CM

## 2021-03-01 DIAGNOSIS — K219 Gastro-esophageal reflux disease without esophagitis: Secondary | ICD-10-CM

## 2021-03-01 NOTE — Telephone Encounter (Signed)
Dr Wynetta Emery refilled med today 03/01/21

## 2021-03-01 NOTE — Telephone Encounter (Signed)
Requested Prescriptions  Pending Prescriptions Disp Refills  . atenolol (TENORMIN) 25 MG tablet [Pharmacy Med Name: ATENOLOL '25MG'$  TABLETS] 30 tablet 0    Sig: TAKE 1 TABLET(25 MG) BY MOUTH DAILY     Cardiovascular:  Beta Blockers Failed - 03/01/2021  1:16 PM      Failed - Valid encounter within last 6 months    Recent Outpatient Visits          6 months ago Hospital discharge follow-up   [place] Karle Plumber B, MD   9 months ago Type 2 diabetes mellitus with stage 2 chronic kidney disease, unspecified whether long term insulin use ([place])   [place], Neoma Laming B, MD   1 year ago Type 2 diabetes mellitus with stage 2 chronic kidney disease, unspecified whether long term insulin use [facility])   [place], [place], [place]   1 year ago Need for vaccination against Streptococcus pneumoniae   [place], RPH-CPP   1 year ago 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, with long-term current use of insulin, macular edema presence unspecified, unspecified retinopathy severity ([place])   [place], MD      Future Appointments            In 6 days Ladell Pier, MD [place] BP in normal range    BP Readings from Last 1 Encounters:  02/10/21 138/60         Passed - Last Heart Rate in normal range    Pulse Readings from Last 1 Encounters:  02/10/21 79

## 2021-03-01 NOTE — Telephone Encounter (Signed)
Requested Prescriptions  Pending Prescriptions Disp Refills  . Potassium Chloride ER 20 MEQ TBCR [Pharmacy Med Name: POTASSIUM CHLORIDE 20MEQ ER TABLETS] 90 tablet 3    Sig: TAKE 1 TABLET BY MOUTH DAILY     Endocrinology:  Minerals - Potassium Supplementation Passed - 03/01/2021  1:16 PM      Passed - K in normal range and within 360 days    Potassium  Date Value Ref Range Status  01/27/2021 4.0 3.5 - 5.1 mmol/L Final         Passed - Cr in normal range and within 360 days    Creat  Date Value Ref Range Status  05/12/2016 1.14 (H) 0.50 - 0.99 mg/dL Final    Comment:      For patients > or = 69 years of age: The upper reference limit for Creatinine is approximately 13% higher for people identified as African-American.      Creatinine, Ser  Date Value Ref Range Status  01/26/2021 0.95 0.44 - 1.00 mg/dL Final   Creatinine, Urine  Date Value Ref Range Status  11/27/2019 146.00 mg/dL Final    Comment:    Performed at [facility], [place] [address] [place], [place] [address]         Passed - Valid encounter within last 12 months    Recent Outpatient Visits          6 months ago Hospital discharge follow-up   [place] Karle Plumber B, MD   9 months ago Type 2 diabetes mellitus with stage 2 chronic kidney disease, unspecified whether long term insulin use ([place])   [place], Neoma Laming B, MD   1 year ago Type 2 diabetes mellitus with stage 2 chronic kidney disease, unspecified whether long term insulin use [facility])   [place], [place], [place]   1 year ago Need for vaccination against Streptococcus pneumoniae   [place], RPH-CPP   1 year ago 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, with long-term current use of insulin, macular edema presence unspecified, unspecified retinopathy severity  [facility])   [place], MD      Future Appointments            In 6 days Ladell Pier, MD [place]           . pantoprazole (PROTONIX) 40 MG tablet [Pharmacy Med Name: PANTOPRAZOLE '40MG'$  TABLETS] 90 tablet 0    Sig: TAKE 1 TABLET(40 MG) BY MOUTH DAILY     Gastroenterology: Proton Pump Inhibitors Passed - 03/01/2021  1:16 PM      Passed - Valid encounter within last 12 months    Recent Outpatient Visits          6 months ago Hospital discharge follow-up   [place] Karle Plumber B, MD   9 months ago Type 2 diabetes mellitus with stage 2 chronic kidney disease, unspecified whether long term insulin use ([place])   [place], Neoma Laming B, MD   1 year ago Type 2 diabetes mellitus with stage 2 chronic kidney disease, unspecified whether long term insulin use [facility])   [place], [place]   1 year ago Need for vaccination against Streptococcus pneumoniae  [place], RPH-CPP   1 year ago 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, with long-term current use of insulin, macular edema presence unspecified, unspecified retinopathy severity [facility])   [place], MD      Future Appointments            In 6 days Ladell Pier, MD [place]

## 2021-03-05 ENCOUNTER — Encounter: Payer: Self-pay | Admitting: Nurse Practitioner

## 2021-03-05 ENCOUNTER — Ambulatory Visit: Payer: Medicare HMO | Admitting: Nurse Practitioner

## 2021-03-05 VITALS — BP 136/70 | HR 76 | Ht 63.5 in | Wt 243.1 lb

## 2021-03-05 DIAGNOSIS — K219 Gastro-esophageal reflux disease without esophagitis: Secondary | ICD-10-CM | POA: Diagnosis not present

## 2021-03-05 DIAGNOSIS — J449 Chronic obstructive pulmonary disease, unspecified: Secondary | ICD-10-CM | POA: Diagnosis not present

## 2021-03-05 DIAGNOSIS — E119 Type 2 diabetes mellitus without complications: Secondary | ICD-10-CM

## 2021-03-05 DIAGNOSIS — Z86010 Personal history of colonic polyps: Secondary | ICD-10-CM | POA: Diagnosis not present

## 2021-03-05 DIAGNOSIS — I5030 Unspecified diastolic (congestive) heart failure: Secondary | ICD-10-CM

## 2021-03-05 DIAGNOSIS — Z800 Family history of malignant neoplasm of digestive organs: Secondary | ICD-10-CM | POA: Diagnosis not present

## 2021-03-05 MED ORDER — SUPREP BOWEL PREP KIT 17.5-3.13-1.6 GM/177ML PO SOLN: 1.0000 | ORAL | 0 refills | Status: DC

## 2021-03-05 NOTE — Patient Instructions (Signed)
If you are age 68 or older, your body mass index should be between 23-30. Your Body mass index is 42.39 kg/m. If this is out of the aforementioned range listed, please consider follow up with your Primary Care Provider.  The [place] [facility] providers would like to encourage you to use [facility] to communicate with providers for non-urgent requests or questions.  Due to long hold times on the telephone, sending your provider a message by [facility] may be faster and more efficient way to get a response. Please allow 48 business hours for a response.  Please remember that this is for non-urgent requests/questions.  PROCEDURES: You have been scheduled for a colonoscopy at [facility]. Please follow the written instructions given to you at your visit today. Please pick up your prep supplies at the pharmacy within the next 1-3 days. If you use inhalers (even only as needed), please bring them with you on the day of your procedure.  Dr. Henrene Pastor would like you to hold your insulin the morning of the colonoscopy.  It was great seeing you today! Thank you for entrusting me with your care and choosing [facility].  Noralyn Pick, CRNP

## 2021-03-05 NOTE — Progress Notes (Addendum)
03/05/2021 Iceis Knab [id number] 05-06-1951   Chief Complaint: Change in stool color, schedule a colonoscopy   History of Present Illness: Heidi [name] is a 70 year old female with a past medical history of obesity, anxiety, hypertension, diastolic CHF, DM II, COPD on home oxygen, admitted to the hospital with acute respiratory failure secondary to Covid 19 pneumonia 08/2020 treated with IV steroids, Remdesivir and Actemra, hypercholesterolemia, thyroid nodule, CKD stage III, migraine headaches, anemia and colon polyps.   She presets to our office today for further evaluation regarding a change in her stool color and to schedule a colonoscopy.  Her most recent colonoscopy was 10/2016 and 2 tubular adenomatous polyps were removed from the transverse colon and cecum. A recall colonoscopy in 3 years was recommended. She was last seen in office by Dr. Henrene Pastor 06/13/2020 to schedule a surveillance colonoscopy due to a significant family history of colon cancer (parent and sibling) and personal history of colon polyps. A colonoscopy at [facility] was ordered but was not done. She wishes to schedule a colonoscopy at this time. She recently changed her diet, she cannot recall or explain what dietary changes she made which resulted in passing green to yellow soft to loose stools maybe once or twice daily. She possible saw a small amount of blood on her stool on one or two occasions but she is not sure. No black stools. She has vague brief right and left mid abdominal pains, no abdominal pain at this time.  She resumed her prior eating habits and her stool is back to a normal brown color and is mostly solid. She continues to use oxygen 1L [place] during the nighttime. No CP or cough but has chronic dyspnea with exertion. She is taking Pantoprazole 27m QD, denies having any dysphagia, heartburn or upper abdominal pain.   She was admitted to the hospital 01/26/2021 after falling out of bed in the setting of taking  Tramadol for headaches described a different than her typical migraine headaches with diffuse myalgias in her neck, arms and back. AN extensive work up including CT head, CT cervical spine, CT angio head with and without contrast, CT angio neck with and without contrast, CT angio chest as well as MRI brain without any evidence of stroke, brain hemorrhage, vascular stenosis or PE.  She was advised to avoid opioids and she was discharged home on 01/28/2021.  She was last seen by cardiologist Dr. PVirgina Jockon 02/10/2021. At that time, she complained of having episodes when she feels like she is going to pass out, similar to how she felt when she was admitted to the hospital 7/24 as noted above. Dr. PVirgina Jockordered a Zio patch monitor x 1 week and if no arrhythmia identified then her symptoms unlikely cardiac in origin. See preliminary Zio patch results below. She was instructed by Dr. PMarylyn Ishiharato stop ASA as she does not have coronary artery disease.   Zio patch results 02/25/2021: Preliminary Findings Final Interpretation Patient had a min HR of 64 bpm, max HR of 127 bpm, and avg HR of 85 bpm. Predominant underlying rhythm was Sinus Rhythm. 2 Supraventricular Tachycardia runs occurred, the run with the fastest interval lasting 6 beats with a max rate of 121 bpm (avg 108 bpm); the run with the fastest interval was also the longest. Isolated SVEs were rare (<1.0%), SVE Couplets were rare (<1.0%), and SVE Triplets were rare (<1.0%). Isolated VEs were rare (<1.0%), and no VE Couplets or VE Triplets were  present.  EKG 02/10/2021: Sinus rhythm 80 npm Leftward axis Poor R wave progression No acute ischemic changes   Lexiscan Tetrofosmin stress test 12/09/2019: Lexiscan nuclear stress test performed using 1-day protocol. Stress EKG is non-diagnostic, as this is pharmacological stress test using Lexiscan. Myocardial perfusion imaging is normal. Stress LVEF 74% Low risk study  ECHO 08/25/2019: 1. Left  ventricular ejection fraction, by estimation, is 65 to 70%. The left ventricle has normal function. LV endocardial border not optimally defined to evaluate regional wall motion. There is mild concentric left ventricular hypertrophy. Indeterminate diastolic filling due to EA fusion. 2. Right ventricular systolic function is normal. The right ventricular size is mildly enlarged. Tricuspid regurgitation signal is inadequate for assessing PA pressure. 3. The mitral valve is grossly normal. No evidence of mitral valve regurgitation. No evidence of mitral stenosis. 4. The aortic valve is grossly normal. Aortic valve regurgitation is not visualized. No aortic stenosis is present  Colonoscopy 10/28/2016: - Three 2 to 3 mm polyps in the transverse colon and in the cecum, removed with a cold snare. Resected and retrieved. - Internal hemorrhoids. - The examination was otherwise normal on direct and retroflexion views. - 3 year colonoscopy recall 1. Surgical [P], cecum, polyp - TUBULAR ADENOMA(S). - HIGH GRADE DYSPLASIA IS NOT IDENTIFIED. 2. Surgical [P], transverse, polyp - TUBULAR ADENOMA(S). - HIGH GRADE DYSPLASIA IS NOT IDENTIFIED. 3. Surgical [P], transverse, polyp - FOOD MATERIAL. - THERE IS NO EVIDENCE OF MALIGNANCY.   CBC Latest Ref Rng & Units 01/27/2021 01/26/2021 08/30/2020  WBC 4.0 - 10.5 K/uL - 7.2 3.6  Hemoglobin 12.0 - 15.0 g/dL 14.3 12.5 13.3  Hematocrit 36.0 - 46.0 % 42.0 41.6 41.9  Platelets 150 - 400 K/uL - 196 128(L)    CMP Latest Ref Rng & Units 01/27/2021 01/26/2021 08/30/2020  Glucose 70 - 99 mg/dL - 414(H) 58(L)  BUN 8 - 23 mg/dL - 8 11  Creatinine 0.44 - 1.00 mg/dL - 0.95 1.16(H)  Sodium 135 - 145 mmol/L 140 135 147(H)  Potassium 3.5 - 5.1 mmol/L 4.0 4.0 4.2  Chloride 98 - 111 mmol/L - 101 107(H)  CO2 22 - 32 mmol/L - 26 24  Calcium 8.9 - 10.3 mg/dL - 9.0 9.6  Total Protein 6.5 - 8.1 g/dL - 6.1(L) 6.3  Total Bilirubin 0.3 - 1.2 mg/dL - 0.2(L) 0.3  Alkaline Phos 38 -  126 U/L - 119 81  AST 15 - 41 U/L - 16 24  ALT 0 - 44 U/L - 18 35(H)     Past Medical History:  Diagnosis Date   Adenomatous colon polyp    Anemia    Anxiety    Asthma    CHF (congestive heart failure) (HCC)    COPD (chronic obstructive pulmonary disease) ([place])    Diabetes mellitus without complication (HCC)    High cholesterol    Hypertension    Left knee injury    Pneumonia    Supplemental oxygen dependent    2L [place]   Past Surgical History:  Procedure Laterality Date   ABDOMINAL HYSTERECTOMY     CATARACT EXTRACTION Left    CATARACT EXTRACTION     LT 01/2017, 02/2017 on RT   CESAREAN SECTION     Current Outpatient Medications on File Prior to Visit  Medication Sig Dispense Refill   Accu-Chek FastClix Lancets MISC Use as directed to test blood sugar three times daily DX E11.65 102 each 6   albuterol (PROVENTIL) (2.5 MG/3ML) 0.083% nebulizer solution USE 1  VIAL VIA NEBULIZER EVERY 6 HOURS AS NEEDED FOR WHEEZING OR SHORTNESS OF BREATH 25 vial 0   albuterol (VENTOLIN HFA) 108 (90 Base) MCG/ACT inhaler Inhale 1-2 puffs into the lungs every 4 (four) hours as needed for wheezing or shortness of breath. 8 g 5   atenolol (TENORMIN) 25 MG tablet TAKE 1 TABLET(25 MG) BY MOUTH DAILY 30 tablet 0   atorvastatin (LIPITOR) 40 MG tablet TAKE 1 TABLET BY MOUTH DAILY AT 6 PM 90 tablet 0   Blood Glucose Monitoring Suppl (ACCU-CHEK GUIDE ME) w/Device KIT Use as directed to test blood sugar three times daily DX E11.65 1 kit 0   dextromethorphan-guaiFENesin (MUCINEX DM) 30-600 MG 12hr tablet Take 1 tablet by mouth 2 (two) times daily.     famotidine (PEPCID) 20 MG tablet Take 1 tablet (20 mg total) by mouth 2 (two) times daily. 60 tablet 4   fluticasone-salmeterol (ADVAIR HFA) 115-21 MCG/ACT inhaler Inhale 2 puffs into the lungs 2 (two) times daily.     furosemide (LASIX) 40 MG tablet Take 1 tablet (40 mg total) by mouth daily. 30 tablet 3   glucose blood (ACCU-CHEK GUIDE) test strip Use as directed  to test blood sugar three times daily DX E11.65 100 each 6   insulin aspart protamine- aspart (NOVOLOG MIX 70/30) (70-30) 100 UNIT/ML injection Inject 70 units in the morning and 45 units in the evening. 10 mL 2   INSULIN SYRINGE 1CC/29G 29G X 1/2" 1 ML MISC USE THREE TIMES DAILY AS DIRECTED 100 each 3   loratadine (CLARITIN) 10 MG tablet Take 10 mg by mouth daily as needed for allergies.     losartan (COZAAR) 50 MG tablet TAKE 1 TABLET(50 MG) BY MOUTH DAILY 90 tablet 3   methocarbamol (ROBAXIN) 500 MG tablet Take 1 tablet (500 mg total) by mouth every 8 (eight) hours as needed for muscle spasms. 90 tablet 0   montelukast (SINGULAIR) 10 MG tablet Take 1 tablet (10 mg total) by mouth at bedtime. 90 tablet 2   Multiple Vitamin (MULTIVITAMIN WITH MINERALS) TABS tablet Take 1 tablet by mouth daily.     olopatadine (PATANOL) 0.1 % ophthalmic solution Place 1 drop into both eyes 2 (two) times daily. 5 mL 0   pantoprazole (PROTONIX) 40 MG tablet TAKE 1 TABLET(40 MG) BY MOUTH DAILY 90 tablet 0   Potassium Chloride ER 20 MEQ TBCR TAKE 1 TABLET BY MOUTH DAILY 90 tablet 3   tetrahydrozoline-zinc (VISINE-AC) 0.05-0.25 % ophthalmic solution Place 1 drop into both eyes 3 (three) times daily as needed (dry / irritated eyes).     topiramate (TOPAMAX) 25 MG capsule TAKE 3 CAPSULES(75 MG) BY MOUTH TWICE DAILY 180 capsule 1   nitroGLYCERIN (NITROSTAT) 0.3 MG SL tablet Place 1 tablet (0.3 mg total) under the tongue every 5 (five) minutes as needed for chest pain. Max 3 tablets in 15 minutes (Patient not taking: Reported on 03/05/2021) 30 tablet 1   [DISCONTINUED] Fexofenadine HCl (MUCINEX ALLERGY PO) Take 1 tablet by mouth daily as needed (for allergy).     No current facility-administered medications on file prior to visit.   Allergies  Allergen Reactions   Januvia [Sitagliptin]     Chest pains   Tramadol Other (See Comments)    hallucinations   Current Medications, Allergies, Past Medical History, Past  Surgical History, Family History and Social History were reviewed in [employer] record.   Review of Systems:   Constitutional: Negative for fever, sweats, chills or weight  loss.  Respiratory: Negative for shortness of breath.   Cardiovascular: Negative for chest pain, palpitations and leg swelling.  Gastrointestinal: See HPI.  Musculoskeletal: Negative for back pain or muscle aches.  Neurological: Negative for dizziness, headaches or paresthesias.    Physical Exam: BP 136/70 (BP Location: Left Arm, Patient Position: Sitting, Cuff Size: Normal)   Pulse 76   Ht 5' 3.5" (1.613 m) Comment: height measured without shoes  Wt 243 lb 2 oz (110.3 kg)   BMI 42.39 kg/m  General: 70 year old obese female in no acute distress. Head: Normocephalic and atraumatic. Eyes: No scleral icterus. Conjunctiva pink . Ears: Normal auditory acuity. Mouth: Upper and lower dentures. No ulcers or lesions.  Lungs: Clear throughout to auscultation. Heart: Regular rate and rhythm, no murmur. Abdomen: Soft, nontender and nondistended. No masses or hepatomegaly. Normal bowel sounds x 4 quadrants. Lower midline abdominal scar intact.  Rectal: Deferred.  Musculoskeletal: Symmetrical with no gross deformities. Extremities: Mild bilateral LE edema/ankles.  Neurological: Alert oriented x 4. No focal deficits.  Psychological: Alert and cooperative. Normal mood and affect  Assessment and Recommendations:  66) 70 year old female with a personal history of colon polyps and family history of colon cancer (2 first degree relatives).  -Colonoscopy at [facility], she is at hight risk due to multiple comorbidities including obesity, diabetes, COPD on home oxygen. Colonoscopy benefits and risks discussed including risk with sedation, risk of bleeding, perforation and infection. -Schedule morning procedure  -Hold insulin morning of procedure  -Request cardiac clearance by Dr. Virgina Jock  secondary to ongoing  issues with questionable near syncope episodes, Zio Patch monitor preliminary results showed brief runs of SVT.   2) Pre-syncope?  Negative head CT and brain MRI. Preliminary Zio Patch study resulted 02/25/2021 showed 2 runs of SVT.  3) Change in bowel color likely due to recent diet changes, resolved   4) GERD, well controlled on Pantoprazole 77m QD  5) CHF LV EF 65 - 70% per ECHO 08/2019  6) COPD on 1L [place] at nighttime   7) DM II   ADDENDUM: Cardiac clearance from Dr. PVirgina Jockreceived 03/06/2021 Low cardiac risk for colonoscopy.   Regards, Dr. PVirgina Jock

## 2021-03-06 ENCOUNTER — Telehealth: Payer: Self-pay | Admitting: Nurse Practitioner

## 2021-03-06 ENCOUNTER — Telehealth: Payer: Self-pay | Admitting: *Deleted

## 2021-03-06 NOTE — Progress Notes (Signed)
Patient is scheduled for colonoscopy with Dr. Henrene Pastor at [facility] on 04/15/2021

## 2021-03-06 NOTE — Telephone Encounter (Signed)
Ok, thank you

## 2021-03-06 NOTE — Telephone Encounter (Signed)
Lauren, pls contact patient's cardiologist Dr.Patwardhan and request a cardiac clearance prior to her her 04/2021 colonoscopy at [facility] due to her current cardiac work up for postural dizziness/near syncope. Zio patch study was completed 02/25/2021, patient waiting for results and follow up instructions from Dr. Virgina Jock. Prelim Zio patch results shows brief runs of SVT. Pt does not have CAD.  THX

## 2021-03-06 NOTE — Telephone Encounter (Signed)
  Pre-operative Risk Assessment     Request for surgical clearance:     Endoscopy Procedure  What type of surgery is being performed?     Colonoscopy  When is this surgery scheduled?     October 2022  What type of clearance is required ?   Medical  Are there any medications that need to be held prior to surgery and how long? None  Practice name and name of physician performing surgery?      [facility] Dr Henrene Pastor  What is your office phone and fax number?      Phone- [phone]  [phone]  Anesthesia type (None, local, MAC, general) ?       MAC

## 2021-03-06 NOTE — Telephone Encounter (Signed)
Heidi [name], just to clarify I have asked for a cardiac clearance

## 2021-03-06 NOTE — Telephone Encounter (Signed)
Medical Clearance

## 2021-03-06 NOTE — Telephone Encounter (Signed)
Low cardiac risk for colonoscopy.  Regards, Dr. Virgina Jock

## 2021-03-06 NOTE — Progress Notes (Signed)
Noted. This needs to be done at the hospital (non urgent)

## 2021-03-06 NOTE — Telephone Encounter (Signed)
Medical Clearance has been requested to Dr Virgina Jock.

## 2021-03-07 ENCOUNTER — Other Ambulatory Visit: Payer: Self-pay

## 2021-03-07 ENCOUNTER — Ambulatory Visit: Payer: Medicare HMO | Attending: Internal Medicine | Admitting: Internal Medicine

## 2021-03-07 ENCOUNTER — Telehealth: Payer: Self-pay | Admitting: Internal Medicine

## 2021-03-07 VITALS — BP 137/76 | HR 71 | Resp 16 | Wt 238.6 lb

## 2021-03-07 DIAGNOSIS — G8929 Other chronic pain: Secondary | ICD-10-CM

## 2021-03-07 DIAGNOSIS — J9611 Chronic respiratory failure with hypoxia: Secondary | ICD-10-CM | POA: Diagnosis not present

## 2021-03-07 DIAGNOSIS — R55 Syncope and collapse: Secondary | ICD-10-CM

## 2021-03-07 DIAGNOSIS — K219 Gastro-esophageal reflux disease without esophagitis: Secondary | ICD-10-CM

## 2021-03-07 DIAGNOSIS — M542 Cervicalgia: Secondary | ICD-10-CM | POA: Diagnosis not present

## 2021-03-07 DIAGNOSIS — G4733 Obstructive sleep apnea (adult) (pediatric): Secondary | ICD-10-CM | POA: Diagnosis not present

## 2021-03-07 DIAGNOSIS — Z9981 Dependence on supplemental oxygen: Secondary | ICD-10-CM

## 2021-03-07 DIAGNOSIS — I10 Essential (primary) hypertension: Secondary | ICD-10-CM

## 2021-03-07 DIAGNOSIS — E1165 Type 2 diabetes mellitus with hyperglycemia: Secondary | ICD-10-CM | POA: Diagnosis not present

## 2021-03-07 NOTE — Progress Notes (Signed)
Patient ID: Heidi [name], female    DOB: Nov 17, 1950  MRN: [id number]  CC: Follow-up (Patient has changed insurance and has questions about medication. )   Subjective: Heidi [name] is a 70 y.o. female who presents for chronic ds management Her concerns today include:  history of HTN, DM 2 with retinopathy, HL, CKD 3, diastolic CHF, hypoxic respiratory failure/cough variant asthma 2 L O2 continuously, migraines ([facility] on Botox inj), OSA (not  on BiPAP), UACS, GERD, OA knees,  DDD/spondylosis of cervical spine (followed by [facility]) obesity, renal cyst, LT thyroid nodule.  DM: Lab Results  Component Value Date   HGBA1C 10.3 (H) 01/28/2021   Patient checks blood sugars 3 times a day. She forgot to bring her logbook with her.  She is not able to tell me how her blood sugars have been running.  She is supposed to be on NovoLog 70/30 70 units in the morning and 45 units in the evening.  However she states her co-pay went up to $48 per vial.  She has been purchasing insulin over-the-counter at [facility].  She thinks it is Novolin 70/30.  She has been taking 80 units in the morning and 50 units in the evening.  She has changed insurance to [facility] and thinks she will not have any problems now with getting the NovoLog 70/30.  HTN: Reports compliance with taking atenolol, Cozaar and furosemide.  She took medicines already for today.  She reports checking blood pressure at home regularly.  Recent blood pressure reading at home was 117/78.  She was hospitalized the end of July for 2 days after she fell out of bed.  According to the discharge summary patient told them that she was previously on Lortab but that I had been prescribing tramadol and tramadol causes her to have vivid dreams.  There is some mention in the discharge summary of patient taking a Lortab that she received from her daughter.  Patient was upset when I mention this to her today.  She stated that she had took a Lortab  from her daughter many months ago and had nothing to do with the recent hospitalization.  CT of the head along with CT angiogram revealed no acute pathology.  MRI of the brain revealed moderate chronic microvascular ischemic changes.  CT of the cervical spine revealed no advanced spinal canal or neuroforaminal stenosis.  Since last visit with me, she did see the pain specialist Dr. Letta Pate.  His assessment was that her radiologic findings are not significant to warrant long-term opioids.  He recommended referral to physical therapy but patient declined.  He gave her some Robaxin to use as needed.  Patient states she was not pleased with him and will not be going back to see him. Since then, patient called the office on the 24th of last month to report that she had a fainting spell 2 days prior.  She tells me that she stood up from a seated position and just fainted.  She was out for several seconds.  Her daughter and granddaughter were present.  She did not have any incontinence of bowel or bladder.  She needed to go to the restroom.  Her granddaughter helped her to the bathroom.  While sitting on the commode her arms began to shake uncontrollably.  She did not lose consciousness.  She has not had any further episodes.  She has seen her cardiologist Dr. Robb Matar.  She reports that he had her wear a monitor for  1 week and they have told her that it came back okay.  She has not driven since this episode.  GERD: She has a bottle of famotidine with her and pantoprazole.  She feels she does not need the pantoprazole anymore.  She feels she does better on the famotidine.  Chronic hypoxic respiratory failure on home O2: Reports using her O2 at nights mainly because her oxygen level tends to drop into the 80s when she lays down.  She sometimes uses it during the day if she feels short of breath.  She has a pulse ox device and checks her levels regularly.  Referred for sleep study on last visit with me in  February.  Patient states she was called for an appointment but did not go because she had too much going on at that time.      Patient Active Problem List   Diagnosis Date Noted   Postural dizziness with presyncope 02/10/2021   Acute encephalopathy 01/27/2021   Fall at home, initial encounter 01/27/2021   Pneumonia due to COVID-19 virus 08/10/2020   Encephalopathy due to COVID-19 virus 08/10/2020   CAP (community acquired pneumonia) 08/10/2020   Acute respiratory failure with hypoxia and hypercapnia ([place]) 08/10/2020   Acute kidney injury superimposed on CKD ([place]) 08/10/2020   COVID 08/10/2020   Encounter for medication review and counseling 03/18/2020   Medication management 02/28/2020   Exertional dyspnea 01/03/2020   Multifocal pneumonia 11/24/2019   Acute respiratory failure ([place]) 11/24/2019   Breast nodule 11/24/2019   Chronic respiratory failure with hypoxia, on home oxygen therapy ([employer] [facility]) 11/13/2019   Atypical chest pain 08/24/2019   Cervical myelopathy ([place]) 08/16/2018   Diabetic retinopathy of right eye associated with type 2 diabetes mellitus ([place]) 08/16/2018   Upper airway cough syndrome 05/03/2018   Morbid obesity due to excess calories ([place]) complicated by hbp/ dm / hyperlipidemia 09/10/2017   Cough variant asthma vs UACS/vcd on ACEi  09/09/2017   Anxiety and depression 09/06/2017   Mild persistent asthma without complication 29/52/8413   Paranoia ([place]) 09/06/2017   Torn left ear lobe 11/20/2016   Chronic migraine w/o aura w/o status migrainosus, not intractable 06/27/2016   Post-menopausal atrophic vaginitis 11/14/2015   Primary osteoarthritis of both knees 07/18/2015   GERD (gastroesophageal reflux disease) 05/08/2015   Renal cyst 05/08/2015   Adenomatous polyp of colon 03/26/2015   Essential hypertension 02/12/2015   Cognitive deficit due to old head trauma 02/12/2015   Asthma, chronic 02/12/2015   Chronic left shoulder pain 02/11/2015   Thyroid nodule  12/18/2014   Chronic neck pain    HLD (hyperlipidemia)    OSA treated with BiPAP 11/27/2014   Obesity hypoventilation syndrome ([place]) 11/27/2014   Demand ischemia ([place])    Uncontrolled type 2 diabetes mellitus with hyperglycemia ([place])    Cardiomegaly 11/25/2014   CHF (congestive heart failure) ([place]) 11/25/2014   DM type 2 causing CKD stage 2 ([place]) 11/25/2014     Current Outpatient Medications on File Prior to Visit  Medication Sig Dispense Refill   Accu-Chek FastClix Lancets MISC Use as directed to test blood sugar three times daily DX E11.65 102 each 6   albuterol (PROVENTIL) (2.5 MG/3ML) 0.083% nebulizer solution USE 1 VIAL VIA NEBULIZER EVERY 6 HOURS AS NEEDED FOR WHEEZING OR SHORTNESS OF BREATH 25 vial 0   albuterol (VENTOLIN HFA) 108 (90 Base) MCG/ACT inhaler Inhale 1-2 puffs into the lungs every 4 (four) hours as needed for wheezing or shortness of breath. 8 g 5  atenolol (TENORMIN) 25 MG tablet TAKE 1 TABLET(25 MG) BY MOUTH DAILY 30 tablet 0   atorvastatin (LIPITOR) 40 MG tablet TAKE 1 TABLET BY MOUTH DAILY AT 6 PM 90 tablet 0   Blood Glucose Monitoring Suppl (ACCU-CHEK GUIDE ME) w/Device KIT Use as directed to test blood sugar three times daily DX E11.65 1 kit 0   dextromethorphan-guaiFENesin (MUCINEX DM) 30-600 MG 12hr tablet Take 1 tablet by mouth 2 (two) times daily.     famotidine (PEPCID) 20 MG tablet Take 1 tablet (20 mg total) by mouth 2 (two) times daily. 60 tablet 4   fluticasone-salmeterol (ADVAIR HFA) 115-21 MCG/ACT inhaler Inhale 2 puffs into the lungs 2 (two) times daily.     furosemide (LASIX) 40 MG tablet Take 1 tablet (40 mg total) by mouth daily. 30 tablet 3   glucose blood (ACCU-CHEK GUIDE) test strip Use as directed to test blood sugar three times daily DX E11.65 100 each 6   insulin aspart protamine- aspart (NOVOLOG MIX 70/30) (70-30) 100 UNIT/ML injection Inject 70 units in the morning and 45 units in the evening. 10 mL 2   INSULIN SYRINGE 1CC/29G 29G X 1/2" 1  ML MISC USE THREE TIMES DAILY AS DIRECTED 100 each 3   loratadine (CLARITIN) 10 MG tablet Take 10 mg by mouth daily as needed for allergies.     losartan (COZAAR) 50 MG tablet TAKE 1 TABLET(50 MG) BY MOUTH DAILY 90 tablet 3   methocarbamol (ROBAXIN) 500 MG tablet Take 1 tablet (500 mg total) by mouth every 8 (eight) hours as needed for muscle spasms. 90 tablet 0   montelukast (SINGULAIR) 10 MG tablet Take 1 tablet (10 mg total) by mouth at bedtime. 90 tablet 2   Multiple Vitamin (MULTIVITAMIN WITH MINERALS) TABS tablet Take 1 tablet by mouth daily.     nitroGLYCERIN (NITROSTAT) 0.3 MG SL tablet Place 1 tablet (0.3 mg total) under the tongue every 5 (five) minutes as needed for chest pain. Max 3 tablets in 15 minutes 30 tablet 1   Potassium Chloride ER 20 MEQ TBCR TAKE 1 TABLET BY MOUTH DAILY 90 tablet 3   SUPREP BOWEL PREP KIT 17.5-3.13-1.6 GM/177ML SOLN Take 1 kit by mouth as directed. For colonoscopy prep 354 mL 0   tetrahydrozoline-zinc (VISINE-AC) 0.05-0.25 % ophthalmic solution Place 1 drop into both eyes 3 (three) times daily as needed (dry / irritated eyes).     topiramate (TOPAMAX) 25 MG capsule TAKE 3 CAPSULES(75 MG) BY MOUTH TWICE DAILY 180 capsule 1   olopatadine (PATANOL) 0.1 % ophthalmic solution Place 1 drop into both eyes 2 (two) times daily. (Patient not taking: Reported on 03/07/2021) 5 mL 0   [DISCONTINUED] Fexofenadine HCl (MUCINEX ALLERGY PO) Take 1 tablet by mouth daily as needed (for allergy).     No current facility-administered medications on file prior to visit.    Allergies  Allergen Reactions   Januvia [Sitagliptin]     Chest pains   Tramadol Other (See Comments)    hallucinations    Social History   Socioeconomic History   Marital status: Widowed    Spouse name: Not on file   Number of children: 4   Years of education: Not on file   Highest education level: Not on file  Occupational History   Occupation: Disabled  Tobacco Use   Smoking status: Never    Smokeless tobacco: Never  Vaping Use   Vaping Use: Never used  Substance and Sexual Activity   Alcohol use: No  Drug use: No   Sexual activity: Not on file  Other Topics Concern   Not on file  Social History Narrative   Lives with daughter   Caffeine use: 1 cup coffee per day   Social Determinants of Health   Financial Resource Strain: Not on file  Food Insecurity: Not on file  Transportation Needs: Not on file  Physical Activity: Not on file  Stress: Not on file  Social Connections: Not on file  Intimate Partner Violence: Not on file    Family History  Problem Relation Age of Onset   Colon cancer Father    Diabetes type II Sister    Diabetes type II Brother    Colon cancer Sister    Migraines Neg Hx    Breast cancer Neg Hx     Past Surgical History:  Procedure Laterality Date   ABDOMINAL HYSTERECTOMY     CATARACT EXTRACTION Left    CATARACT EXTRACTION     LT 01/2017, 02/2017 on RT   CESAREAN SECTION      ROS: Review of Systems Negative except as stated above  PHYSICAL EXAM: BP 137/76   Pulse 71   Resp 16   Wt 238 lb 9.6 oz (108.2 kg)   SpO2 95%   BMI 41.60 kg/m   Wt Readings from Last 3 Encounters:  03/07/21 238 lb 9.6 oz (108.2 kg)  03/05/21 243 lb 2 oz (110.3 kg)  02/10/21 231 lb 9.6 oz (105.1 kg)  Sitting: BP 135/78, pulse 72 Standing: BP 129/79, pulse of 74  Physical Exam  General appearance - alert, well appearing, and in no distress.  Patient answered her cell phone twice during visit.  2 additional calls came in which she did not answer.  I asked her to please put her phone on vibrate. Mouth - mucous membranes moist, pharynx normal without lesions Neck - supple, no significant adenopathy Chest - clear to auscultation, no wheezes, rales or rhonchi, symmetric air entry Heart - normal rate, regular rhythm, normal S1, S2, no murmurs, rubs, clicks or gallops Neurological -cranial nerves grossly intact.  Power in upper and lower extremities 5/5  bilaterally.  Gross sensation intact.  She has a cane with her but ambulates without it.    CMP Latest Ref Rng & Units 01/27/2021 01/26/2021 08/30/2020  Glucose 70 - 99 mg/dL - 414(H) 58(L)  BUN 8 - 23 mg/dL - 8 11  Creatinine 0.44 - 1.00 mg/dL - 0.95 1.16(H)  Sodium 135 - 145 mmol/L 140 135 147(H)  Potassium 3.5 - 5.1 mmol/L 4.0 4.0 4.2  Chloride 98 - 111 mmol/L - 101 107(H)  CO2 22 - 32 mmol/L - 26 24  Calcium 8.9 - 10.3 mg/dL - 9.0 9.6  Total Protein 6.5 - 8.1 g/dL - 6.1(L) 6.3  Total Bilirubin 0.3 - 1.2 mg/dL - 0.2(L) 0.3  Alkaline Phos 38 - 126 U/L - 119 81  AST 15 - 41 U/L - 16 24  ALT 0 - 44 U/L - 18 35(H)   Lipid Panel     Component Value Date/Time   CHOL 137 01/28/2021 0523   CHOL 138 05/14/2020 1538   TRIG 151 (H) 01/28/2021 0523   HDL 52 01/28/2021 0523   HDL 57 05/14/2020 1538   CHOLHDL 2.6 01/28/2021 0523   VLDL 30 01/28/2021 0523   LDLCALC 55 01/28/2021 0523   LDLCALC 54 05/14/2020 1538    CBC    Component Value Date/Time   WBC 7.2 01/26/2021 2317   RBC  4.49 01/26/2021 2317   HGB 14.3 01/27/2021 0314   HGB 13.3 08/30/2020 1141   HCT 42.0 01/27/2021 0314   HCT 41.9 08/30/2020 1141   PLT 196 01/26/2021 2317   PLT 128 (L) 08/30/2020 1141   MCV 92.7 01/26/2021 2317   MCV 88 08/30/2020 1141   MCH 27.8 01/26/2021 2317   MCHC 30.0 01/26/2021 2317   RDW 14.3 01/26/2021 2317   RDW 15.2 08/30/2020 1141   LYMPHSABS 2.1 01/26/2021 2317   LYMPHSABS 4.3 (H) 08/23/2017 1655   MONOABS 0.6 01/26/2021 2317   EOSABS 0.4 01/26/2021 2317   EOSABS 0.4 08/23/2017 1655   BASOSABS 0.1 01/26/2021 2317   BASOSABS 0.1 08/23/2017 1655     ASSESSMENT AND PLAN: 1. Uncontrolled type 2 diabetes mellitus with hyperglycemia ([place]) I will have her follow-up with our clinical pharmacist in about 2 weeks.  Advised that she brings her blood sugar logs with her so that decisions can be made about how to adjust her insulin.  I also requested that she call us back with the name of the  insulin that she has been purchasing over-the-counter from [place].  2. Syncope, unspecified syncope type Could have been vasovagal.  Recommend that she stay hydrated and go slowly when going from sitting to standing.  Reassuring that she has seen her cardiologist and had worn a monitor for 1 week with report of no abnormality.  Patient concern about the possibility that it could have been a seizure.  I doubt that it was given that it lasted only a few seconds.  However she reports uncontrollable shaking several minutes later while sitting on her toilet.  I told her that we will refer her to neurology.  Advised that she not drive for about 6 months and provided that she does not have any further episodes during that time.  3. Essential hypertension Continue current medications and low-salt diet  4. Chronic respiratory failure with hypoxia, on home oxygen therapy ([place]) Continue home O2. I recommend that we reschedule her sleep study and patient is agreeable to that.  5. Gastroesophageal reflux disease without esophagitis Pantoprazole discontinued.  She will continue famotidine.  6. OSA (obstructive sleep apnea) - PSG Sleep Study; Future  7. Chronic neck pain Recent CT of the cervical spine revealed no significant abnormalities.  We will hold off on tramadol.  Patient was given the opportunity to ask questions.  Patient verbalized understanding of the plan and was able to repeat key elements of the plan.   Orders Placed This Encounter  Procedures   PSG Sleep Study     Requested Prescriptions    No prescriptions requested or ordered in this encounter    No follow-ups on file.  Karle Plumber, MD, FACP

## 2021-03-07 NOTE — Telephone Encounter (Signed)
Copied from [place] [id number]. Topic: General - Other >> Mar 07, 2021  4:27 PM Leward Quan A wrote: Reason for CRM: Patient called in to inform dr Wynetta Emery that she was getting Novolin 73/30 80 units in AM and 40 units PM. Per patient and her daughter states that insurance want her to have a Tier 1 insulin. Per patient she was not given a direct name. Just that she need to get a tier 1 insulin. Also want all her medication to come to the [facility] and [place] Phone:  [phone]  Fax:  [phone]. Also want to know if patient can get her Covid booster here at the clinic. Please advise

## 2021-03-09 ENCOUNTER — Ambulatory Visit: Payer: Medicare HMO | Admitting: Internal Medicine

## 2021-03-11 ENCOUNTER — Other Ambulatory Visit: Payer: Self-pay

## 2021-03-11 MED ORDER — INSULIN ASPART PROT & ASPART (70-30 MIX) 100 UNIT/ML ~~LOC~~ SUSP
SUBCUTANEOUS | 2 refills | Status: DC
  Filled 2021-03-11: qty 10, 8d supply, fill #0
  Filled 2021-03-12: qty 10, 9d supply, fill #0
  Filled 2021-03-17: qty 30, 26d supply, fill #0
  Filled 2021-03-20: qty 10, 8d supply, fill #0

## 2021-03-11 NOTE — Telephone Encounter (Signed)
We don't have the COVID booster here. Pt can check with [facility] or [facility].   Will forward to [facility] to see what Tier 1 Insulin her insurance will approve

## 2021-03-11 NOTE — Telephone Encounter (Addendum)
Patient called in to inform Dr. Wynetta Emery that she was getting Novolin 70/30 80 units in AM and 40 units PM. Per insurance, pt's Novolog mix is covered. I have sent her rx here to our pharmacy.

## 2021-03-12 ENCOUNTER — Telehealth: Payer: Self-pay

## 2021-03-12 ENCOUNTER — Other Ambulatory Visit: Payer: Self-pay

## 2021-03-12 NOTE — Telephone Encounter (Signed)
-----   Message from Ladell Pier, MD sent at 03/08/2021  6:57 PM EDT ----- Please give pt appt with Lurena Joiner in 2 wks for DM check Give f/u with me in 4 mths

## 2021-03-12 NOTE — Telephone Encounter (Signed)
Pt has been scheduled and reminder has been mailed.  

## 2021-03-17 ENCOUNTER — Other Ambulatory Visit: Payer: Self-pay | Admitting: Internal Medicine

## 2021-03-17 ENCOUNTER — Other Ambulatory Visit: Payer: Self-pay

## 2021-03-17 NOTE — Telephone Encounter (Signed)
Requested medication (s) are due for refill today:   Yes  Requested medication (s) are on the active medication list:   Yes  Future visit scheduled:   Yes   Last ordered: 03/11/2021 10 ml, 2 refills  Returned because pharmacy needs new rx for Humalog mix 75/25 PREF per Ins   Requested Prescriptions  Pending Prescriptions Disp Refills   insulin aspart protamine- aspart (NOVOLOG MIX 70/30) (70-30) 100 UNIT/ML injection 10 mL 2    Sig: Inject 70 units in the morning and 45 units in the evening.     Endocrinology:  Diabetes - Insulins Failed - 03/17/2021 11:10 AM      Failed - HBA1C is between 0 and 7.9 and within 180 days    HbA1c, POC (controlled diabetic range)  Date Value Ref Range Status  08/30/2020 9.9 (A) 0.0 - 7.0 % Final   Hgb A1c MFr Bld  Date Value Ref Range Status  01/28/2021 10.3 (H) 4.8 - 5.6 % Final    Comment:    (NOTE)         Prediabetes: 5.7 - 6.4         Diabetes: >6.4         Glycemic control for adults with diabetes: <7.0           Passed - Valid encounter within last 6 months    Recent Outpatient Visits           1 week ago Syncope, unspecified syncope type   [place], Deborah B, MD   6 months ago Hospital discharge follow-up   [place], Deborah B, MD   10 months ago Type 2 diabetes mellitus with stage 2 chronic kidney disease, unspecified whether long term insulin use ([place])   [place], Neoma Laming B, MD   1 year ago Type 2 diabetes mellitus with stage 2 chronic kidney disease, unspecified whether long term insulin use [facility])   [place], [place], [place]   1 year ago Need for vaccination against Streptococcus pneumoniae   [place], RPH-CPP       Future Appointments             In 4 months Wynetta Emery, Dalbert Batman, MD [place]

## 2021-03-19 ENCOUNTER — Other Ambulatory Visit: Payer: Self-pay

## 2021-03-20 ENCOUNTER — Other Ambulatory Visit: Payer: Self-pay | Admitting: Pharmacist

## 2021-03-20 ENCOUNTER — Other Ambulatory Visit: Payer: Self-pay

## 2021-03-20 MED ORDER — HUMALOG MIX 75/25 (75-25) 100 UNIT/ML ~~LOC~~ SUSP
SUBCUTANEOUS | 2 refills | Status: DC
  Filled 2021-03-20: qty 30, 26d supply, fill #0
  Filled 2021-03-21: qty 10, 9d supply, fill #0
  Filled 2021-03-27: qty 10, 9d supply, fill #1
  Filled 2021-04-01: qty 70, 61d supply, fill #2

## 2021-03-21 ENCOUNTER — Other Ambulatory Visit: Payer: Self-pay

## 2021-03-24 ENCOUNTER — Telehealth: Payer: Self-pay | Admitting: Internal Medicine

## 2021-03-24 ENCOUNTER — Ambulatory Visit: Payer: Self-pay

## 2021-03-24 ENCOUNTER — Other Ambulatory Visit: Payer: Self-pay

## 2021-03-24 MED ORDER — LORATADINE 10 MG PO TABS: 10.0000 mg | ORAL_TABLET | Freq: Every day | ORAL | 3 refills | Status: DC | PRN

## 2021-03-24 NOTE — Telephone Encounter (Signed)
Pt daughter [name] is calling and would like dr Wynetta Emery to prescribed zyrtec and a good cough syrup for patient due to allergies. Pt seen dr Wynetta Emery on 03-07-2021. Pharm [facility]

## 2021-03-24 NOTE — Telephone Encounter (Signed)
Daughter reports Tramadol had been causing the pt. To hallucinate when she took. Daughter has taken the medication away. Would like something different sent to her pharmacy. Please advise.    Answer Assessment - Initial Assessment Questions 1. NAME of MEDICATION: "What medicine are you calling about?"     Tramadol 2. QUESTION: "What is your question?" (e.g., double dose of medicine, side effect)     Side effect 3. PRESCRIBING HCP: "Who prescribed it?" Reason: if prescribed by specialist, call should be referred to that group.     Dr. Wynetta Emery 4. SYMPTOMS: "Do you have any symptoms?"     Hallucinations 5. SEVERITY: If symptoms are present, ask "Are they mild, moderate or severe?"     Severe 6. PREGNANCY:  "Is there any chance that you are pregnant?" "When was your last menstrual period?"     No  Protocols used: Medication Question Call-A-AH

## 2021-03-25 ENCOUNTER — Other Ambulatory Visit: Payer: Self-pay

## 2021-03-25 DIAGNOSIS — I10 Essential (primary) hypertension: Secondary | ICD-10-CM | POA: Diagnosis not present

## 2021-03-25 DIAGNOSIS — J45909 Unspecified asthma, uncomplicated: Secondary | ICD-10-CM | POA: Diagnosis not present

## 2021-03-25 NOTE — Telephone Encounter (Signed)
Patient daughter aware of medication being sent to pharmacy and OTC medication recommendations. Verbalized understanding.

## 2021-03-25 NOTE — Telephone Encounter (Signed)
Patient daughter aware Rx was sent to pharmacy.

## 2021-03-27 ENCOUNTER — Other Ambulatory Visit: Payer: Self-pay

## 2021-03-28 ENCOUNTER — Other Ambulatory Visit: Payer: Self-pay

## 2021-04-01 ENCOUNTER — Other Ambulatory Visit: Payer: Self-pay

## 2021-04-01 ENCOUNTER — Telehealth: Payer: Self-pay | Admitting: Internal Medicine

## 2021-04-01 ENCOUNTER — Other Ambulatory Visit: Payer: Self-pay | Admitting: Pharmacist

## 2021-04-01 MED ORDER — HUMALOG MIX 75/25 (75-25) 100 UNIT/ML ~~LOC~~ SUSP
SUBCUTANEOUS | 2 refills | Status: DC
  Filled 2021-04-01: qty 30, 26d supply, fill #0

## 2021-04-01 MED ORDER — HUMALOG MIX 75/25 (75-25) 100 UNIT/ML ~~LOC~~ SUSP
SUBCUTANEOUS | 2 refills | Status: DC
  Filled 2021-04-01: qty 100, 87d supply, fill #0
  Filled 2021-06-05: qty 10, 8d supply, fill #1
  Filled 2021-06-10: qty 30, 26d supply, fill #1
  Filled 2021-06-10 – 2021-06-20 (×2): qty 100, 87d supply, fill #1
  Filled 2021-08-22: qty 100, 87d supply, fill #0
  Filled ????-??-?? (×2): fill #0

## 2021-04-01 NOTE — Telephone Encounter (Signed)
Copied from [place] [id number]. Topic: Appointment Scheduling - Scheduling Inquiry for Clinic >> Mar 26, 2021  2:46 PM Alanda Slim E wrote: Reason for CRM: pt needs an appt with Lurena Joiner to check her sugar and asked if he can give her a call about about something / please advise >> Mar 27, 2021  8:15 AM Lennox Solders wrote: Pt is calling back and would like to talk with luke and an appt   Collier [place],  Does this patient need an appointment with you? I did not see any notes or missed appointments. Let me know and I will call to schedule.

## 2021-04-03 ENCOUNTER — Encounter (HOSPITAL_COMMUNITY): Payer: Self-pay | Admitting: Internal Medicine

## 2021-04-03 NOTE — Telephone Encounter (Signed)
I think Dr. Wynetta Emery mentioned follow-up on the 9/2 visit but pt did not schedule. She is somewhat of a poor historian and difficult to follow. If she makes an appointment I will need her daughter to come with her.

## 2021-04-04 ENCOUNTER — Other Ambulatory Visit: Payer: Self-pay | Admitting: Internal Medicine

## 2021-04-04 DIAGNOSIS — I10 Essential (primary) hypertension: Secondary | ICD-10-CM

## 2021-04-04 NOTE — Progress Notes (Signed)
Attempted to obtain medical history via telephone, unable to reach at this time. I left a voicemail to return pre surgical testing department's phone call.  

## 2021-04-04 NOTE — Telephone Encounter (Signed)
Pt states yes, she needs to schedule with [facility].  First available is 10/11. Pt states she needs appt next week.  Unable to schedule. Pt aware someone will call her back.

## 2021-04-04 NOTE — Telephone Encounter (Signed)
Called patient to see if appt with Lurena Joiner was still needed. No answer and left vm to call [phone] to schedule an appt.

## 2021-04-07 ENCOUNTER — Ambulatory Visit: Payer: Medicare Other | Admitting: Neurology

## 2021-04-07 DIAGNOSIS — G43709 Chronic migraine without aura, not intractable, without status migrainosus: Secondary | ICD-10-CM

## 2021-04-07 NOTE — Progress Notes (Signed)
Botox- 100 units x 2 vials Lot: [id number] Expiration: 02/2023 NDC: 5631-4970-26  Bacteriostatic 0.9% Sodium Chloride- 88mL total Lot: [id number] Expiration: 07/06/2022 NDC: 5027-7412-87  Dx: O67.672 B/B

## 2021-04-07 NOTE — Progress Notes (Signed)
Interval history 04/07/2021 STABLE: Patient with chronic intractable migraines. Still doing exceptionally well on Botox for Migraines, only botox and topiramate have ever helped. Prior tried multiple classes of medications which did not help with headaches or migraines until she started Botox. Since starting botox she has had  >95% improvement in frequency and severity, had to redo one botox and she did well again. The migraines she does have are sparse, and only recur when the botox is wearing off the last 2 weeks but still are easier to treat and >60% less severe.Nothing has worked in the past except botox and Topiramate.  No masseters or any other additional injections  Consent Form Botulism Toxin Injection For Chronic Migraine    Reviewed orally with patient, additionally signature is on file:  Botulism toxin has been approved by the Federal drug administration for treatment of chronic migraine. Botulism toxin does not cure chronic migraine and it may not be effective in some patients.  The administration of botulism toxin is accomplished by injecting a small amount of toxin into the muscles of the neck and head. Dosage must be titrated for each individual. Any benefits resulting from botulism toxin tend to wear off after 3 months with a repeat injection required if benefit is to be maintained. Injections are usually done every 3-4 months with maximum effect peak achieved by about 2 or 3 weeks. Botulism toxin is expensive and you should be sure of what costs you will incur resulting from the injection.  The side effects of botulism toxin use for chronic migraine may include:   -Transient, and usually mild, facial weakness with facial injections  -Transient, and usually mild, head or neck weakness with head/neck injections  -Reduction or loss of forehead facial animation due to forehead muscle weakness  -Eyelid drooping  -Dry eye  -Pain at the site of injection or bruising at the site of  injection  -Double vision  -Potential unknown long term risks  Contraindications: You should not have Botox if you are pregnant, nursing, allergic to albumin, have an infection, skin condition, or muscle weakness at the site of the injection, or have myasthenia gravis, Lambert-Eaton syndrome, or ALS.  It is also possible that as with any injection, there may be an allergic reaction or no effect from the medication. Reduced effectiveness after repeated injections is sometimes seen and rarely infection at the injection site may occur. All care will be taken to prevent these side effects. If therapy is given over a long time, atrophy and wasting in the muscle injected may occur. Occasionally the patient's become refractory to treatment because they develop antibodies to the toxin. In this event, therapy needs to be modified.  I have read the above information and consent to the administration of botulism toxin.    BOTOX PROCEDURE NOTE FOR MIGRAINE HEADACHE    Contraindications and precautions discussed with patient(above). Aseptic procedure was observed and patient tolerated procedure. Procedure performed by Dr. Georgia Dom  The condition has existed for more than 6 months, and pt does not have a diagnosis of ALS, Myasthenia Gravis or Lambert-Eaton Syndrome.  Risks and benefits of injections discussed and pt agrees to proceed with the procedure.  Written consent obtained  These injections are medically necessary. Pt  receives good benefits from these injections. These injections do not cause sedations or hallucinations which the oral therapies may cause.  Description of procedure:  The patient was placed in a sitting position. The standard protocol was used for Botox as follows,  with 5 units of Botox injected at each site:   -Procerus muscle, midline injection  -Corrugator muscle, bilateral injection  -Frontalis muscle, bilateral injection, with 2 sites each side, medial injection was  performed in the upper one third of the frontalis muscle, in the region vertical from the medial inferior edge of the superior orbital rim. The lateral injection was again in the upper one third of the forehead vertically above the lateral limbus of the cornea, 1.5 cm lateral to the medial injection site.  -Temporalis muscle injection, 4 sites, bilaterally. The first injection was 3 cm above the tragus of the ear, second injection site was 1.5 cm to 3 cm up from the first injection site in line with the tragus of the ear. The third injection site was 1.5-3 cm forward between the first 2 injection sites. The fourth injection site was 1.5 cm posterior to the second injection site.   -Occipitalis muscle injection, 3 sites, bilaterally. The first injection was done one half way between the occipital protuberance and the tip of the mastoid process behind the ear. The second injection site was done lateral and superior to the first, 1 fingerbreadth from the first injection. The third injection site was 1 fingerbreadth superiorly and medially from the first injection site.  -Cervical paraspinal muscle injection, 2 sites, bilateral knee first injection site was 1 cm from the midline of the cervical spine, 3 cm inferior to the lower border of the occipital protuberance. The second injection site was 1.5 cm superiorly and laterally to the first injection site.  -Trapezius muscle injection was performed at 3 sites, bilaterally. The first injection site was in the upper trapezius muscle halfway between the inflection point of the neck, and the acromion. The second injection site was one half way between the acromion and the first injection site. The third injection was done between the first injection site and the inflection point of the neck.   Will return for repeat injection in 3 months.   155 units of Botox was used, 45U Botox not injected was wasted. The patient tolerated the procedure well, there were no  complications of the above procedure.

## 2021-04-15 ENCOUNTER — Other Ambulatory Visit: Payer: Self-pay

## 2021-04-15 ENCOUNTER — Ambulatory Visit (HOSPITAL_COMMUNITY): Payer: Medicare Other | Admitting: Anesthesiology

## 2021-04-15 ENCOUNTER — Ambulatory Visit (HOSPITAL_COMMUNITY)
Admission: RE | Admit: 2021-04-15 | Discharge: 2021-04-15 | Disposition: A | Discharge status: Home / Self Care | Payer: Medicare Other | Attending: Internal Medicine | Admitting: Internal Medicine

## 2021-04-15 ENCOUNTER — Encounter (HOSPITAL_COMMUNITY)
Admission: RE | Disposition: A | Discharge status: Home / Self Care | Payer: Self-pay | Source: Home / Self Care | Attending: Internal Medicine

## 2021-04-15 ENCOUNTER — Encounter (HOSPITAL_COMMUNITY): Payer: Self-pay | Admitting: Internal Medicine

## 2021-04-15 DIAGNOSIS — Z860100 Personal history of colon polyps, unspecified: Secondary | ICD-10-CM

## 2021-04-15 DIAGNOSIS — I509 Heart failure, unspecified: Secondary | ICD-10-CM | POA: Insufficient documentation

## 2021-04-15 DIAGNOSIS — K648 Other hemorrhoids: Secondary | ICD-10-CM | POA: Insufficient documentation

## 2021-04-15 DIAGNOSIS — K589 Irritable bowel syndrome without diarrhea: Secondary | ICD-10-CM | POA: Diagnosis not present

## 2021-04-15 DIAGNOSIS — K219 Gastro-esophageal reflux disease without esophagitis: Secondary | ICD-10-CM | POA: Insufficient documentation

## 2021-04-15 DIAGNOSIS — D123 Benign neoplasm of transverse colon: Secondary | ICD-10-CM | POA: Insufficient documentation

## 2021-04-15 DIAGNOSIS — Z885 Allergy status to narcotic agent status: Secondary | ICD-10-CM | POA: Diagnosis not present

## 2021-04-15 DIAGNOSIS — E119 Type 2 diabetes mellitus without complications: Secondary | ICD-10-CM | POA: Diagnosis not present

## 2021-04-15 DIAGNOSIS — Z800 Family history of malignant neoplasm of digestive organs: Secondary | ICD-10-CM | POA: Diagnosis not present

## 2021-04-15 DIAGNOSIS — K635 Polyp of colon: Secondary | ICD-10-CM | POA: Diagnosis not present

## 2021-04-15 DIAGNOSIS — D124 Benign neoplasm of descending colon: Secondary | ICD-10-CM

## 2021-04-15 DIAGNOSIS — E669 Obesity, unspecified: Secondary | ICD-10-CM | POA: Diagnosis not present

## 2021-04-15 DIAGNOSIS — G4730 Sleep apnea, unspecified: Secondary | ICD-10-CM | POA: Insufficient documentation

## 2021-04-15 DIAGNOSIS — D122 Benign neoplasm of ascending colon: Secondary | ICD-10-CM | POA: Diagnosis not present

## 2021-04-15 DIAGNOSIS — I110 Hypertensive heart disease with heart failure: Secondary | ICD-10-CM | POA: Diagnosis not present

## 2021-04-15 DIAGNOSIS — Z9981 Dependence on supplemental oxygen: Secondary | ICD-10-CM | POA: Diagnosis not present

## 2021-04-15 DIAGNOSIS — Z888 Allergy status to other drugs, medicaments and biological substances status: Secondary | ICD-10-CM | POA: Diagnosis not present

## 2021-04-15 DIAGNOSIS — J449 Chronic obstructive pulmonary disease, unspecified: Secondary | ICD-10-CM | POA: Insufficient documentation

## 2021-04-15 DIAGNOSIS — Z1211 Encounter for screening for malignant neoplasm of colon: Secondary | ICD-10-CM | POA: Diagnosis not present

## 2021-04-15 HISTORY — PX: POLYPECTOMY: SHX5525

## 2021-04-15 HISTORY — DX: Sleep apnea, unspecified: G47.30

## 2021-04-15 HISTORY — PX: COLONOSCOPY WITH PROPOFOL: SHX5780

## 2021-04-15 LAB — GLUCOSE, CAPILLARY
Glucose-Capillary: 228 mg/dL — ABNORMAL HIGH (ref 70–99)
Glucose-Capillary: 175 mg/dL — ABNORMAL HIGH (ref 70–99)

## 2021-04-15 SURGERY — COLONOSCOPY WITH PROPOFOL
Anesthesia: Monitor Anesthesia Care

## 2021-04-15 MED ORDER — SODIUM CHLORIDE 0.9 % IV SOLN: INTRAVENOUS | Status: DC

## 2021-04-15 MED ORDER — PROPOFOL 500 MG/50ML IV EMUL
INTRAVENOUS | Status: AC
  Filled 2021-04-15: qty 50

## 2021-04-15 MED ORDER — LIDOCAINE HCL 1 % IJ SOLN
INTRAMUSCULAR | Status: DC | PRN
  Administered 2021-04-15 (×2): 50 mg via INTRADERMAL

## 2021-04-15 MED ORDER — PROPOFOL 500 MG/50ML IV EMUL
INTRAVENOUS | Status: DC | PRN
  Administered 2021-04-15: 120 ug/kg/min via INTRAVENOUS

## 2021-04-15 MED ORDER — PHENYLEPHRINE 40 MCG/ML (10ML) SYRINGE FOR IV PUSH (FOR BLOOD PRESSURE SUPPORT)
PREFILLED_SYRINGE | INTRAVENOUS | Status: DC | PRN
  Administered 2021-04-15: 120 ug via INTRAVENOUS

## 2021-04-15 MED ORDER — LACTATED RINGERS IV SOLN
INTRAVENOUS | Status: AC | PRN
  Administered 2021-04-15: 1000 mL via INTRAVENOUS

## 2021-04-15 SURGICAL SUPPLY — 21 items

## 2021-04-15 NOTE — Discharge Instructions (Signed)

## 2021-04-15 NOTE — Op Note (Signed)
[facility] Patient Name: Heidi [name] Procedure Date: 04/15/2021 MRN: [id number] Attending MD: Docia Chuck. Henrene Pastor , MD Date of Birth: [birth year] CSN: [id number] Age: 70 Admit Type: Outpatient Procedure:                Colonoscopy with cold snare polypectomy x 3 Indications:              High risk colon cancer surveillance: Personal                            history of multiple (3 or more) adenomas. Also                            family history of colon cancer in parent and                            sibling. Previous examinations 2015, 2018 Providers:                Docia Chuck. Henrene Pastor, MD, Debi Mays, RN, Frazier Richards,                            Technician, Cletis Athens, Technician Referring MD:             Karle Plumber, MD Medicines:                Monitored Anesthesia Care Complications:            No immediate complications. Estimated blood loss:                            None. Estimated Blood Loss:     Estimated blood loss: none. Procedure:                Pre-Anesthesia Assessment:                           - Prior to the procedure, a History and Physical                            was performed, and patient medications and                            allergies were reviewed. The patient's tolerance of                            previous anesthesia was also reviewed. The risks                            and benefits of the procedure and the sedation                            options and risks were discussed with the patient.                            All questions were answered, and informed consent  was obtained. Prior Anticoagulants: The patient has                            taken no previous anticoagulant or antiplatelet                            agents. ASA Grade Assessment: III - A patient with                            severe systemic disease. After reviewing the risks                            and benefits, the patient was deemed  in                            satisfactory condition to undergo the procedure.                           After obtaining informed consent, the colonoscope                            was passed under direct vision. Throughout the                            procedure, the patient's blood pressure, pulse, and                            oxygen saturations were monitored continuously. The                            CF-HQ190L ([id number]) Olympus colonoscope was                            introduced through the anus and advanced to the the                            cecum, identified by appendiceal orifice and                            ileocecal valve. The ileocecal valve, appendiceal                            orifice, and rectum were photographed. The quality                            of the bowel preparation was excellent. The                            colonoscopy was performed without difficulty. The                            patient tolerated the procedure well. The bowel  preparation used was SUPREP via split dose                            instruction. Scope In: 9:21:21 AM Scope Out: 9:34:38 AM Scope Withdrawal Time: 0 hours 10 minutes 2 seconds  Total Procedure Duration: 0 hours 13 minutes 17 seconds  Findings:      Three polyps were found in the descending colon, transverse colon and       ascending colon. The polyps were 2 to 5 mm in size. These polyps were       removed with a cold snare. Resection and retrieval were complete.      The exam was otherwise without abnormality on direct and retroflexion       views. Internal hemorrhoids present. Impression:               - Three 2 to 5 mm polyps in the descending colon,                            in the transverse colon and in the ascending colon,                            removed with a cold snare. Resected and retrieved.                           - The examination was otherwise normal on direct                             and retroflexion views. Internal hemorrhoids. Moderate Sedation:      none Recommendation:           - Repeat colonoscopy in 5 years for surveillance.                           - Patient has a contact number available for                            emergencies. The signs and symptoms of potential                            delayed complications were discussed with the                            patient. Return to normal activities tomorrow.                            Written discharge instructions were provided to the                            patient.                           - Resume previous diet.                           - Continue present medications.                           -  Await pathology results. Procedure Code(s):        --- Professional ---                           [phone], Colonoscopy, flexible; with removal of                            tumor(s), polyp(s), or other lesion(s) by snare                            technique Diagnosis Code(s):        --- Professional ---                           Z86.010, Personal history of colonic polyps                           K63.5, Polyp of colon CPT copyright 2019 [employer]. All rights reserved. The codes documented in this report are preliminary and upon coder review may  be revised to meet current compliance requirements. Docia Chuck. Henrene Pastor, MD 04/15/2021 9:43:12 AM This report has been signed electronically. Number of Addenda: 0

## 2021-04-15 NOTE — Anesthesia Preprocedure Evaluation (Addendum)
Anesthesia Evaluation  Patient identified by MRN, date of birth, ID band Patient awake    Reviewed: Allergy & Precautions, NPO status , Patient's Chart, lab work & pertinent test results  History of Anesthesia Complications Negative for: history of anesthetic complications  Airway Mallampati: II  TM Distance: >3 FB Neck ROM: Full    Dental  (+) Edentulous Upper, Edentulous Lower, Lower Dentures, Upper Dentures, Dental Advisory Given   Pulmonary sleep apnea (d) and Oxygen sleep apnea , COPD,  COPD inhaler and oxygen dependent,    breath sounds clear to auscultation       Cardiovascular hypertension, Pt. on medications (-) angina Rhythm:Regular Rate:Normal  '21 Stress: Myocardial perfusion imaging is normal. Stress LVEF 74% Low risk study '21 ECHO: EF 65-70%. The LV has normal function. mild concentric LVH, no significant valvular abnormalities.    Neuro/Psych Anxiety Depression    GI/Hepatic GERD  Medicated and Controlled,  Endo/Other  diabetes (glu 228), Insulin Dependent  Renal/GU Renal InsufficiencyRenal disease     Musculoskeletal  (+) Arthritis ,   Abdominal (+) + obese,   Peds  Hematology   Anesthesia Other Findings   Reproductive/Obstetrics                            Anesthesia Physical Anesthesia Plan  ASA: 3  Anesthesia Plan: MAC   Post-op Pain Management:    Induction:   PONV Risk Score and Plan: 2 and Ondansetron and Treatment may vary due to age or medical condition  Airway Management Planned: Natural Airway and Simple Face Mask  Additional Equipment: None  Intra-op Plan:   Post-operative Plan:   Informed Consent: I have reviewed the patients History and Physical, chart, labs and discussed the procedure including the risks, benefits and alternatives for the proposed anesthesia with the patient or authorized representative who has indicated his/her understanding and  acceptance.     Dental advisory given  Plan Discussed with: CRNA and Surgeon  Anesthesia Plan Comments:        Anesthesia Quick Evaluation

## 2021-04-15 NOTE — Transfer of Care (Signed)
Immediate Anesthesia Transfer of Care Note  Patient: Heidi [name]  Procedure(s) Performed: COLONOSCOPY WITH PROPOFOL POLYPECTOMY  Patient Location: PACU and Endoscopy Unit  Anesthesia Type:MAC  Level of Consciousness: awake, alert , oriented and patient cooperative  Airway & Oxygen Therapy: Patient Spontanous Breathing and Patient connected to face mask oxygen  Post-op Assessment: Report given to RN and Post -op Vital signs reviewed and stable  Post vital signs: Reviewed and stable  Last Vitals:  Vitals Value Taken Time  BP 125/58 04/15/21 0943  Temp    Pulse 81 04/15/21 0944  Resp 35 04/15/21 0944  SpO2 96 % 04/15/21 0944  Vitals shown include unvalidated device data.  Last Pain:  Vitals:   04/15/21 0803  TempSrc: Oral  PainSc: 0-No pain         Complications: No notable events documented.

## 2021-04-15 NOTE — H&P (Signed)
HISTORY OF PRESENT ILLNESS:  Heidi [name] is a 70 y.o. female presents today for outpatient colonoscopy.  She was recently seen in the office by the GI nurse practitioner.  Impression and plans as below: Assessment and Recommendations:   107) 70 year old female with a personal history of colon polyps and family history of colon cancer (2 first degree relatives).  -Colonoscopy at [facility], she is at hight risk due to multiple comorbidities including obesity, diabetes, COPD on home oxygen. Colonoscopy benefits and risks discussed including risk with sedation, risk of bleeding, perforation and infection. -Schedule morning procedure  -Hold insulin morning of procedure  -Request cardiac clearance by Dr. Virgina Jock  secondary to ongoing issues with questionable near syncope episodes, Zio Patch monitor preliminary results showed brief runs of SVT.    2) Pre-syncope?  Negative head CT and brain MRI. Preliminary Zio Patch study resulted 02/25/2021 showed 2 runs of SVT.   3) Change in bowel color likely due to recent diet changes, resolved    4) GERD, well controlled on Pantoprazole 40mg  QD   5) CHF LV EF 65 - 70% per ECHO 08/2019   6) COPD on 1L [place] at nighttime    7) DM II     ADDENDUM: Cardiac clearance from Dr. Virgina Jock received 03/06/2021 Low cardiac risk for colonoscopy.  There have been no interval changes   REVIEW OF SYSTEMS:  All non-GI ROS negative.  Past Medical History:  Diagnosis Date   Adenomatous colon polyp    Anemia    Anxiety    Asthma    CHF (congestive heart failure) (HCC)    COPD (chronic obstructive pulmonary disease) ([place])    Diabetes mellitus without complication ([place])    High cholesterol    Hypertension    Left knee injury    Pneumonia    Sleep apnea    Supplemental oxygen dependent    2L [place]    Past Surgical History:  Procedure Laterality Date   ABDOMINAL HYSTERECTOMY     CATARACT EXTRACTION Left    CATARACT EXTRACTION     LT 01/2017, 02/2017 on RT    [place]  reports that she has never smoked. She has never used smokeless tobacco. She reports that she does not drink alcohol and does not use drugs.  family history includes Colon cancer in her father and sister; Diabetes type II in her brother and sister.  Allergies  Allergen Reactions   Januvia [Sitagliptin]     Chest pains   Tramadol Other (See Comments)    hallucinations       PHYSICAL EXAMINATION:  Vital signs: BP (!) 136/59   Pulse 74   Temp 98.8 F (37.1 C) (Oral)   Resp (!) 30   SpO2 96%  General: Well-developed, well-nourished, no acute distress HEENT: Sclerae are anicteric, conjunctiva pink. Oral mucosa intact Lungs: Clear Heart: Regular Abdomen: soft, nontender, nondistended, no obvious ascites, no peritoneal signs, normal bowel sounds. No organomegaly. Extremities: No edema Psychiatric: alert and oriented x3. Cooperative     ASSESSMENT:  1.  Multiple colon polyps, family history of colon cancer, change in bowel habits 2.  Multiple comorbidities  PLAN:  1.  Colonoscopy

## 2021-04-15 NOTE — Anesthesia Postprocedure Evaluation (Signed)
Anesthesia Post Note  Patient: Heidi [name]  Procedure(s) Performed: COLONOSCOPY WITH PROPOFOL POLYPECTOMY     Patient location during evaluation: Endoscopy Anesthesia Type: MAC Level of consciousness: patient cooperative, awake and alert and oriented Pain management: pain level controlled Vital Signs Assessment: post-procedure vital signs reviewed and stable Respiratory status: nonlabored ventilation, spontaneous breathing, respiratory function stable and patient connected to nasal cannula oxygen Postop Assessment: no apparent nausea or vomiting Anesthetic complications: no   No notable events documented.  Last Vitals:  Vitals:   04/15/21 0953 04/15/21 1003  BP: (!) 119/55 (!) 118/54  Pulse: 81 78  Resp: 20 (!) 30  Temp:    SpO2: 100% 96%    Last Pain:  Vitals:   04/15/21 0943  TempSrc: Oral  PainSc:                  Heidi [name],E. Kymber Kosar

## 2021-04-16 LAB — SURGICAL PATHOLOGY

## 2021-04-17 ENCOUNTER — Encounter (HOSPITAL_COMMUNITY): Payer: Self-pay | Admitting: Internal Medicine

## 2021-04-17 ENCOUNTER — Telehealth: Payer: Self-pay | Admitting: Internal Medicine

## 2021-04-17 NOTE — Telephone Encounter (Signed)
See results note 10/12.

## 2021-04-17 NOTE — Telephone Encounter (Signed)
Patient returned Linda's call from yesterday about path results, please call patient one more time.

## 2021-04-24 DIAGNOSIS — J45909 Unspecified asthma, uncomplicated: Secondary | ICD-10-CM | POA: Diagnosis not present

## 2021-04-24 DIAGNOSIS — I10 Essential (primary) hypertension: Secondary | ICD-10-CM | POA: Diagnosis not present

## 2021-04-30 ENCOUNTER — Institutional Professional Consult (permissible substitution): Payer: Medicare HMO | Admitting: Neurology

## 2021-05-11 ENCOUNTER — Emergency Department (HOSPITAL_BASED_OUTPATIENT_CLINIC_OR_DEPARTMENT_OTHER): Payer: Medicare Other | Admitting: Radiology

## 2021-05-11 ENCOUNTER — Emergency Department (HOSPITAL_BASED_OUTPATIENT_CLINIC_OR_DEPARTMENT_OTHER)
Admission: EM | Admit: 2021-05-11 | Discharge: 2021-05-11 | Disposition: A | Discharge status: Home / Self Care | Payer: Medicare Other | Attending: Emergency Medicine | Admitting: Emergency Medicine

## 2021-05-11 ENCOUNTER — Encounter (HOSPITAL_BASED_OUTPATIENT_CLINIC_OR_DEPARTMENT_OTHER): Payer: Self-pay | Admitting: Obstetrics and Gynecology

## 2021-05-11 DIAGNOSIS — I509 Heart failure, unspecified: Secondary | ICD-10-CM | POA: Insufficient documentation

## 2021-05-11 DIAGNOSIS — I110 Hypertensive heart disease with heart failure: Secondary | ICD-10-CM | POA: Insufficient documentation

## 2021-05-11 DIAGNOSIS — J9811 Atelectasis: Secondary | ICD-10-CM | POA: Diagnosis not present

## 2021-05-11 DIAGNOSIS — E119 Type 2 diabetes mellitus without complications: Secondary | ICD-10-CM | POA: Insufficient documentation

## 2021-05-11 DIAGNOSIS — Z7984 Long term (current) use of oral hypoglycemic drugs: Secondary | ICD-10-CM | POA: Diagnosis not present

## 2021-05-11 DIAGNOSIS — J441 Chronic obstructive pulmonary disease with (acute) exacerbation: Secondary | ICD-10-CM | POA: Insufficient documentation

## 2021-05-11 DIAGNOSIS — R0602 Shortness of breath: Secondary | ICD-10-CM | POA: Diagnosis not present

## 2021-05-11 DIAGNOSIS — Z20822 Contact with and (suspected) exposure to covid-19: Secondary | ICD-10-CM | POA: Insufficient documentation

## 2021-05-11 LAB — BASIC METABOLIC PANEL
Anion gap: 7 (ref 5–15)
BUN: 7 mg/dL — ABNORMAL LOW (ref 8–23)
CO2: 29 mmol/L (ref 22–32)
Calcium: 8.9 mg/dL (ref 8.9–10.3)
Chloride: 107 mmol/L (ref 98–111)
Creatinine, Ser: 1.02 mg/dL — ABNORMAL HIGH (ref 0.44–1.00)
GFR, Estimated: 60 mL/min — ABNORMAL LOW (ref >60)
Glucose, Bld: 135 mg/dL — ABNORMAL HIGH (ref 70–99)
Potassium: 3.9 mmol/L (ref 3.5–5.1)
Sodium: 143 mmol/L (ref 135–145)

## 2021-05-11 LAB — CBC WITH DIFFERENTIAL/PLATELET
Abs Immature Granulocytes: 0.04 10*3/uL (ref 0.00–0.07)
Basophils Absolute: 0.1 10*3/uL (ref 0.0–0.1)
Basophils Relative: 1 %
Eosinophils Absolute: 0.3 10*3/uL (ref 0.0–0.5)
Eosinophils Relative: 4 %
HCT: 41.8 % (ref 36.0–46.0)
Hemoglobin: 12.7 g/dL (ref 12.0–15.0)
Immature Granulocytes: 1 %
Lymphocytes Relative: 41 %
Lymphs Abs: 3.4 10*3/uL (ref 0.7–4.0)
MCH: 27.6 pg (ref 26.0–34.0)
MCHC: 30.4 g/dL (ref 30.0–36.0)
MCV: 90.9 fL (ref 80.0–100.0)
Monocytes Absolute: 0.5 10*3/uL (ref 0.1–1.0)
Monocytes Relative: 6 %
Neutro Abs: 3.9 10*3/uL (ref 1.7–7.7)
Neutrophils Relative %: 47 %
Platelets: 198 10*3/uL (ref 150–400)
RBC: 4.6 MIL/uL (ref 3.87–5.11)
RDW: 14.2 % (ref 11.5–15.5)
WBC: 8.2 10*3/uL (ref 4.0–10.5)
nRBC: 0 % (ref 0.0–0.2)

## 2021-05-11 LAB — BRAIN NATRIURETIC PEPTIDE: B Natriuretic Peptide: 11.3 pg/mL (ref 0.0–100.0)

## 2021-05-11 LAB — TROPONIN I (HIGH SENSITIVITY): Troponin I (High Sensitivity): <2 ng/L (ref <18)

## 2021-05-11 LAB — CBG MONITORING, ED: Glucose-Capillary: 169 mg/dL — ABNORMAL HIGH (ref 70–99)

## 2021-05-11 LAB — RESP PANEL BY RT-PCR (FLU A&B, COVID) ARPGX2
Influenza A by PCR: NEGATIVE
Influenza B by PCR: NEGATIVE
SARS Coronavirus 2 by RT PCR: NEGATIVE

## 2021-05-11 MED ORDER — DOXYCYCLINE HYCLATE 100 MG PO CAPS: 100.0000 mg | ORAL_CAPSULE | Freq: Two times a day (BID) | ORAL | 0 refills | Status: AC

## 2021-05-11 NOTE — ED Provider Notes (Signed)
[facility] EMERGENCY DEPT Provider Note   CSN: [id number] Arrival date & time: 05/11/21  1637     History Chief Complaint  Patient presents with   Shortness of Breath    Heidi [name] is a 70 y.o. female.  70 year old female with history of CHF and COPD, DM, presents with cough and SHOB x 2-3 days. Baseline 2L [place] at home, currently on baseline O2 with sat 99%. Cough is productive with yellow mucous, chills, sweats, fatigue, weakness and chest pain. Patient has taken her nitro a few times over the past few days with relief. Denies fevers, sick contacts, nausea, vomiting. No unexpected changes in wait. Taking Mucinex and Coricidin HBP without relief. SHOB is worse than her baseline, has difficulty going up/down steps at her house. Not taking her Lasix currently. Is concerned she may have PNA, history of prior PNA.       Past Medical History:  Diagnosis Date   Adenomatous colon polyp    Anemia    Anxiety    Asthma    CHF (congestive heart failure) ([place])    COPD (chronic obstructive pulmonary disease) ([place])    Diabetes mellitus without complication ([place])    High cholesterol    Hypertension    Left knee injury    Pneumonia    Sleep apnea    Supplemental oxygen dependent    2L [place]    Patient Active Problem List   Diagnosis Date Noted   History of colonic polyps    Benign neoplasm of ascending colon    Benign neoplasm of transverse colon    Benign neoplasm of descending colon    Postural dizziness with presyncope 02/10/2021   Acute encephalopathy 01/27/2021   Fall at home, initial encounter 01/27/2021   Pneumonia due to COVID-19 virus 08/10/2020   Encephalopathy due to COVID-19 virus 08/10/2020   CAP (community acquired pneumonia) 08/10/2020   Acute respiratory failure with hypoxia and hypercapnia ([place]) 08/10/2020   Acute kidney injury superimposed on CKD ([place]) 08/10/2020   COVID 08/10/2020   Encounter for medication review and counseling 03/18/2020    Medication management 02/28/2020   Exertional dyspnea 01/03/2020   Multifocal pneumonia 11/24/2019   Acute respiratory failure ([place]) 11/24/2019   Breast nodule 11/24/2019   Chronic respiratory failure with hypoxia, on home oxygen therapy ([place]) 11/13/2019   Atypical chest pain 08/24/2019   Cervical myelopathy ([place]) 08/16/2018   Diabetic retinopathy of right eye associated with type 2 diabetes mellitus ([place]) 08/16/2018   Upper airway cough syndrome 05/03/2018   Morbid obesity due to excess calories ([place]) complicated by hbp/ dm / hyperlipidemia 09/10/2017   Cough variant asthma vs UACS/vcd on ACEi  09/09/2017   Anxiety and depression 09/06/2017   Mild persistent asthma without complication 00/76/2263   Paranoia ([place]) 09/06/2017   Torn left ear lobe 11/20/2016   Chronic migraine w/o aura w/o status migrainosus, not intractable 06/27/2016   Post-menopausal atrophic vaginitis 11/14/2015   Primary osteoarthritis of both knees 07/18/2015   GERD (gastroesophageal reflux disease) 05/08/2015   Renal cyst 05/08/2015   Adenomatous polyp of colon 03/26/2015   Essential hypertension 02/12/2015   Cognitive deficit due to old head trauma 02/12/2015   Asthma, chronic 02/12/2015   Chronic left shoulder pain 02/11/2015   Thyroid nodule 12/18/2014   Chronic neck pain    HLD (hyperlipidemia)    OSA treated with BiPAP 11/27/2014   Obesity hypoventilation syndrome ([place]) 11/27/2014   Demand ischemia ([place])    Uncontrolled type  2 diabetes mellitus with hyperglycemia ([place])    Cardiomegaly 11/25/2014   CHF (congestive heart failure) ([place]) 11/25/2014   DM type 2 causing CKD stage 2 ([place]) 11/25/2014    Past Surgical History:  Procedure Laterality Date   ABDOMINAL HYSTERECTOMY     CATARACT EXTRACTION Left    CATARACT EXTRACTION     LT 01/2017, 02/2017 on RT   CESAREAN SECTION     COLONOSCOPY WITH PROPOFOL N/A 04/15/2021   Procedure: COLONOSCOPY WITH PROPOFOL;  Surgeon: Irene Shipper, MD;  Location: [facility]  ENDOSCOPY;  Service: Endoscopy;  Laterality: N/A;  patient is on O2   JOINT REPLACEMENT Left    Plate in Ankle   POLYPECTOMY  04/15/2021   Procedure: POLYPECTOMY;  Surgeon: Irene Shipper, MD;  Location: [facility] ENDOSCOPY;  Service: Endoscopy;;     OB History   No obstetric history on file.     Family History  Problem Relation Age of Onset   Colon cancer Father    Diabetes type II Sister    Diabetes type II Brother    Colon cancer Sister    Migraines Neg Hx    Breast cancer Neg Hx     Social History   Tobacco Use   Smoking status: Never    Passive exposure: Never   Smokeless tobacco: Never  Vaping Use   Vaping Use: Never used  Substance Use Topics   Alcohol use: No   Drug use: No    Home Medications Prior to Admission medications   Medication Sig Start Date End Date Taking? Authorizing Provider  doxycycline (VIBRAMYCIN) 100 MG capsule Take 1 capsule (100 mg total) by mouth 2 (two) times daily for 7 days. 05/11/21 05/18/21 Yes Tacy Learn, PA-C  Accu-Chek FastClix Lancets MISC Use as directed to test blood sugar three times daily DX E11.65 04/10/20   Ladell Pier, MD  acetaminophen (TYLENOL) 500 MG tablet Take 500-1,000 mg by mouth every 6 (six) hours as needed (pain).    [provider]  albuterol (PROVENTIL) (2.5 MG/3ML) 0.083% nebulizer solution USE 1 VIAL VIA NEBULIZER EVERY 6 HOURS AS NEEDED FOR WHEEZING OR SHORTNESS OF BREATH 02/21/18   Ladell Pier, MD  albuterol (VENTOLIN HFA) 108 (90 Base) MCG/ACT inhaler Inhale 1-2 puffs into the lungs every 4 (four) hours as needed for wheezing or shortness of breath. 10/21/20   Spero Geralds, MD  atenolol (TENORMIN) 25 MG tablet TAKE 1 TABLET(25 MG) BY MOUTH DAILY 04/04/21   Ladell Pier, MD  atorvastatin (LIPITOR) 40 MG tablet TAKE 1 TABLET BY MOUTH DAILY AT 6 PM 12/11/20   Ladell Pier, MD  Blood Glucose Monitoring Suppl (ACCU-CHEK GUIDE ME) w/Device KIT Use as directed to test blood sugar three  times daily DX E11.65 04/10/20   Ladell Pier, MD  famotidine (PEPCID) 20 MG tablet Take 1 tablet (20 mg total) by mouth 2 (two) times daily. 01/14/21   Ladell Pier, MD  fluticasone-salmeterol (ADVAIR HFA) [phone] MCG/ACT inhaler Inhale 2 puffs into the lungs 2 (two) times daily.    [provider]  furosemide (LASIX) 40 MG tablet Take 1 tablet (40 mg total) by mouth daily. 08/19/20   Elgergawy, Silver Huguenin, MD  glucose blood (ACCU-CHEK GUIDE) test strip Use as directed to test blood sugar three times daily DX E11.65 04/10/20   Ladell Pier, MD  guaiFENesin (MUCINEX) 600 MG 12 hr tablet Take 600 mg by mouth in the morning.    [provider]  insulin lispro protamine-lispro (HUMALOG MIX 75/25) (75-25) 100 UNIT/ML SUSP injection Inject 70 units in the morning and 45 units in the evening. 04/01/21   Ladell Pier, MD  INSULIN SYRINGE 1CC/29G 29G X 1/2" 1 ML MISC USE THREE TIMES DAILY AS DIRECTED 11/12/20   Ladell Pier, MD  loratadine (CLARITIN) 10 MG tablet Take 1 tablet (10 mg total) by mouth daily as needed for allergies. 03/24/21   Ladell Pier, MD  losartan (COZAAR) 50 MG tablet TAKE 1 TABLET(50 MG) BY MOUTH DAILY 10/07/20   Argentina Donovan, PA-C  methocarbamol (ROBAXIN) 500 MG tablet Take 1 tablet (500 mg total) by mouth every 8 (eight) hours as needed for muscle spasms. 01/30/21   Kirsteins, Luanna Salk, MD  montelukast (SINGULAIR) 10 MG tablet Take 1 tablet (10 mg total) by mouth at bedtime. 03/18/20   Tanda Rockers, MD  Multiple Vitamin (MULTIVITAMIN WITH MINERALS) TABS tablet Take 1 tablet by mouth in the morning. Centrum Silver    [provider]  nitroGLYCERIN (NITROSTAT) 0.3 MG SL tablet Place 1 tablet (0.3 mg total) under the tongue every 5 (five) minutes as needed for chest pain. Max 3 tablets in 15 minutes 08/30/19   Argentina Donovan, PA-C  OXYGEN Inhale 2 L into the lungs continuous.    [provider]  Potassium Chloride ER 20  MEQ TBCR TAKE 1 TABLET BY MOUTH DAILY 03/01/21   Ladell Pier, MD  Pseudoeph-Doxylamine-DM-APAP (NYQUIL PO) Take 15-30 mLs by mouth daily as needed (cough/respiratory issues.).    [provider]  SUPREP BOWEL PREP KIT 17.5-3.13-1.6 GM/177ML SOLN Take 1 kit by mouth as directed. For colonoscopy prep 03/05/21   Noralyn Pick, NP  tetrahydrozoline-zinc (VISINE-AC) 0.05-0.25 % ophthalmic solution Place 1 drop into both eyes 3 (three) times daily as needed (dry / irritated eyes).    [provider]  topiramate (TOPAMAX) 25 MG capsule TAKE 3 CAPSULES(75 MG) BY MOUTH TWICE DAILY 01/14/21   Ladell Pier, MD    Allergies    Januvia [sitagliptin] and Tramadol  Review of Systems   Review of Systems  Constitutional:  Positive for chills. Negative for fever and unexpected weight change.  HENT:  Negative for congestion.   Respiratory:  Positive for cough and shortness of breath.   Cardiovascular:  Positive for chest pain.  Gastrointestinal:  Negative for abdominal pain, constipation, diarrhea, nausea and vomiting.  Musculoskeletal:  Negative for arthralgias and myalgias.  Skin:  Negative for rash and wound.  Allergic/Immunologic: Positive for immunocompromised state.  Neurological:  Negative for weakness.  Hematological:  Negative for adenopathy.  Psychiatric/Behavioral:  Negative for confusion.   All other systems reviewed and are negative.  Physical Exam Updated Vital Signs BP 127/61   Pulse 77   Temp 98.4 F (36.9 C)   Resp (!) 22   SpO2 100%   Physical Exam Vitals and nursing note reviewed.  Constitutional:      General: She is not in acute distress.    Appearance: She is well-developed. She is not diaphoretic.  HENT:     Head: Normocephalic and atraumatic.  Cardiovascular:     Rate and Rhythm: Normal rate and regular rhythm.  Pulmonary:     Effort: Pulmonary effort is normal.     Breath sounds: Examination of the right-lower field reveals  decreased breath sounds. Examination of the left-lower field reveals decreased breath sounds. Decreased breath sounds present. No wheezing, rhonchi or rales.  Chest:  Chest wall: No tenderness.  Abdominal:     Palpations: Abdomen is soft.     Tenderness: There is no abdominal tenderness.  Musculoskeletal:        General: Normal range of motion.     Right lower leg: Edema present.     Left lower leg: Edema present.     Comments: Mild pitting edema bilateral lower extremities, attributes to sitting in the lobby today  Skin:    General: Skin is warm and dry.     Findings: No erythema or rash.  Neurological:     Mental Status: She is alert and oriented to person, place, and time.  Psychiatric:        Behavior: Behavior normal.    ED Results / Procedures / Treatments   Labs (all labs ordered are listed, but only abnormal results are displayed) Labs Reviewed  BASIC METABOLIC PANEL - Abnormal; Notable for the following components:      Result Value   Glucose, Bld 135 (*)    BUN 7 (*)    Creatinine, Ser 1.02 (*)    GFR, Estimated 60 (*)    All other components within normal limits  CBG MONITORING, ED - Abnormal; Notable for the following components:   Glucose-Capillary 169 (*)    All other components within normal limits  RESP PANEL BY RT-PCR (FLU A&B, COVID) ARPGX2  CBC WITH DIFFERENTIAL/PLATELET  BRAIN NATRIURETIC PEPTIDE  TROPONIN I (HIGH SENSITIVITY)  TROPONIN I (HIGH SENSITIVITY)    EKG EKG Interpretation  Date/Time:  Sunday May 11 2021 19:33:31 EST Ventricular Rate:  77 PR Interval:  174 QRS Duration: 91 QT Interval:  373 QTC Calculation: 423 R Axis:   30 Text Interpretation: Sinus rhythm Low voltage, extremity and precordial leads Consider anterior infarct Minimal ST elevation, inferior leads No significant change since last tracing Confirmed by Isla Pence [phone]) on 05/11/2021 7:36:07 PM  Radiology DG Chest 2 View  Result Date: 05/11/2021 CLINICAL  DATA:  Short of breath EXAM: CHEST - 2 VIEW COMPARISON:  01/27/2021 FINDINGS: Cardiac enlargement. Negative for heart failure. Mild atelectasis in the left lower lobe. Negative for infiltrate or effusion. IMPRESSION: Mild left lower lobe atelectasis. Electronically Signed   By: Franchot Gallo M.D.   On: 05/11/2021 19:57    Procedures Procedures   Medications Ordered in ED Medications - No data to display  ED Course  I have reviewed the triage vital signs and the nursing notes.  Pertinent labs & imaging results that were available during my care of the patient were reviewed by me and considered in my medical decision making (see chart for details).  Clinical Course as of 05/11/21 2148  Nancy Fetter May 11, 2730  3212 70 year old female with complaint of cough, productive with yellow sputum for the past few days with increased shortness of breath with concern for pneumonia.  On exam, has diminished lung sounds in the bases, mild lower extremity edema. Labs reviewed, CBC is unremarkable, normal white blood cell count, BMP without significant changes from prior.  She is COVID and flu negative.  Troponin is less than 2, BNP normal at 11.3.  Chest x-ray with mild atelectasis.  Patient is reassured that her x-ray does not show pneumonia.  Plan is to cover with doxycycline for acute COPD exacerbation with concern for her yellow sputum and history of pneumonia.  Recommend recheck with PCP.  Return to ED for worsening or concerning symptoms. [LM]    Clinical Course User Index [LM] Tacy Learn,  PA-C   MDM Rules/Calculators/A&P                           Final Clinical Impression(s) / ED Diagnoses Final diagnoses:  COPD with acute exacerbation ([place])    Rx / DC Orders ED Discharge Orders          Ordered    doxycycline (VIBRAMYCIN) 100 MG capsule  2 times daily        05/11/21 2106             Tacy Learn, PA-C 05/11/21 2148    Isla Pence, MD 05/11/21 2357

## 2021-05-11 NOTE — Discharge Instructions (Signed)
Take doxycycline as prescribed and complete the full course.  Continue with your home breathing treatments.  Recheck with your doctor.

## 2021-05-11 NOTE — ED Triage Notes (Signed)
Patient reports to the ER for cough, ShOB and concern for pneumonia. Patient reports a hx of walking pneumonia "sneaking up on her"

## 2021-05-25 DIAGNOSIS — J45909 Unspecified asthma, uncomplicated: Secondary | ICD-10-CM | POA: Diagnosis not present

## 2021-05-25 DIAGNOSIS — I10 Essential (primary) hypertension: Secondary | ICD-10-CM | POA: Diagnosis not present

## 2021-06-05 ENCOUNTER — Other Ambulatory Visit: Payer: Self-pay

## 2021-06-06 ENCOUNTER — Other Ambulatory Visit: Payer: Self-pay | Admitting: Internal Medicine

## 2021-06-06 DIAGNOSIS — E1169 Type 2 diabetes mellitus with other specified complication: Secondary | ICD-10-CM

## 2021-06-07 NOTE — Telephone Encounter (Signed)
Requested Prescriptions  Pending Prescriptions Disp Refills  . atorvastatin (LIPITOR) 40 MG tablet [Pharmacy Med Name: ATORVASTATIN 40MG  TABLETS] 90 tablet 1    Sig: TAKE 1 TABLET BY MOUTH DAILY AT 6 PM     Cardiovascular:  Antilipid - Statins Failed - 06/06/2021 10:47 PM      Failed - Triglycerides in normal range and within 360 days    Triglycerides  Date Value Ref Range Status  01/28/2021 151 (H) <150 mg/dL Final         Passed - Total Cholesterol in normal range and within 360 days    Cholesterol, Total  Date Value Ref Range Status  05/14/2020 138 100 - 199 mg/dL Final   Cholesterol  Date Value Ref Range Status  01/28/2021 137 0 - 200 mg/dL Final         Passed - LDL in normal range and within 360 days    LDL Chol Calc (NIH)  Date Value Ref Range Status  05/14/2020 54 0 - 99 mg/dL Final   LDL Cholesterol  Date Value Ref Range Status  01/28/2021 55 0 - 99 mg/dL Final    Comment:           Total Cholesterol/HDL:CHD Risk Coronary Heart Disease Risk Table                     Men   Women  1/2 Average Risk   3.4   3.3  Average Risk       5.0   4.4  2 X Average Risk   9.6   7.1  3 X Average Risk  23.4   11.0        Use the calculated Patient Ratio above and the CHD Risk Table to determine the patient's CHD Risk.        ATP III CLASSIFICATION (LDL):  <100     mg/dL   Optimal  100-129  mg/dL   Near or Above                    Optimal  130-159  mg/dL   Borderline  160-189  mg/dL   High  >190     mg/dL   Very High Performed at [facility] [address] [place], [place] [address]          Passed - HDL in normal range and within 360 days    HDL  Date Value Ref Range Status  01/28/2021 52 >40 mg/dL Final  05/14/2020 57 >39 mg/dL Final         Passed - Patient is not pregnant      Passed - Valid encounter within last 12 months    Recent Outpatient Visits          3 months ago Syncope, unspecified syncope type   [place], MD   9 months ago Hospital discharge follow-up   [place], Deborah B, MD   1 year ago Type 2 diabetes mellitus with stage 2 chronic kidney disease, unspecified whether long term insulin use [facility])   [employer] [facility], Deborah B, MD   1 year ago Type 2 diabetes mellitus with stage 2 chronic kidney disease, unspecified whether long term insulin use [facility])   [place], [place]   1 year ago Need for vaccination against Streptococcus pneumoniae  [place], RPH-CPP      Future Appointments            In 1 month Wynetta Emery, Dalbert Batman, MD [place]

## 2021-06-10 ENCOUNTER — Other Ambulatory Visit: Payer: Self-pay

## 2021-06-11 ENCOUNTER — Other Ambulatory Visit: Payer: Self-pay

## 2021-06-13 ENCOUNTER — Other Ambulatory Visit: Payer: Self-pay

## 2021-06-15 ENCOUNTER — Other Ambulatory Visit: Payer: Self-pay | Admitting: Internal Medicine

## 2021-06-15 DIAGNOSIS — K219 Gastro-esophageal reflux disease without esophagitis: Secondary | ICD-10-CM

## 2021-06-15 DIAGNOSIS — I10 Essential (primary) hypertension: Secondary | ICD-10-CM

## 2021-06-15 NOTE — Telephone Encounter (Signed)
Refused pantoprazole was dc'd 03/07/21 by Dr. Wynetta Emery Requested Prescriptions  Signed Prescriptions Disp Refills   atenolol (TENORMIN) 25 MG tablet 90 tablet 0    Sig: TAKE 1 TABLET(25 MG) BY MOUTH DAILY     Cardiovascular:  Beta Blockers Passed - 06/15/2021  6:20 AM      Passed - Last BP in normal range    BP Readings from Last 1 Encounters:  05/11/21 127/61          Passed - Last Heart Rate in normal range    Pulse Readings from Last 1 Encounters:  05/11/21 77          Passed - Valid encounter within last 6 months    Recent Outpatient Visits           3 months ago Syncope, unspecified syncope type   [place], MD   9 months ago Hospital discharge follow-up   [place], Deborah B, MD   1 year ago Type 2 diabetes mellitus with stage 2 chronic kidney disease, unspecified whether long term insulin use ([place])   [place], Neoma Laming B, MD   1 year ago Type 2 diabetes mellitus with stage 2 chronic kidney disease, unspecified whether long term insulin use [facility])   [place], [place], [place]   1 year ago Need for vaccination against Streptococcus pneumoniae   [place], RPH-CPP       Future Appointments             In 1 month Wynetta Emery, Dalbert Batman, MD [facility]   pantoprazole (PROTONIX) 40 MG tablet [Pharmacy Med Name: PANTOPRAZOLE 40MG  TABLETS] 90 tablet     Sig: TAKE 1 TABLET(40 MG) BY MOUTH DAILY     Gastroenterology: Proton Pump Inhibitors Passed - 06/15/2021  6:20 AM      Passed - Valid encounter within last 12 months    Recent Outpatient Visits           3 months ago Syncope, unspecified syncope type   [place], Deborah B, MD   9  months ago Hospital discharge follow-up   [place], Deborah B, MD   1 year ago Type 2 diabetes mellitus with stage 2 chronic kidney disease, unspecified whether long term insulin use ([place])   [place], Neoma Laming B, MD   1 year ago Type 2 diabetes mellitus with stage 2 chronic kidney disease, unspecified whether long term insulin use [facility])   [place], [place], [place]   1 year ago Need for vaccination against Streptococcus pneumoniae   [place], Jarome [place], RPH-CPP       Future Appointments             In 1 month Wynetta Emery, Dalbert Batman, MD [place]

## 2021-06-15 NOTE — Telephone Encounter (Signed)
Requested Prescriptions  Pending Prescriptions Disp Refills  . atenolol (TENORMIN) 25 MG tablet [Pharmacy Med Name: ATENOLOL 25MG  TABLETS] 90 tablet 0    Sig: TAKE 1 TABLET(25 MG) BY MOUTH DAILY     Cardiovascular:  Beta Blockers Passed - 06/15/2021  6:20 AM      Passed - Last BP in normal range    BP Readings from Last 1 Encounters:  05/11/21 127/61         Passed - Last Heart Rate in normal range    Pulse Readings from Last 1 Encounters:  05/11/21 77         Passed - Valid encounter within last 6 months    Recent Outpatient Visits          3 months ago Syncope, unspecified syncope type   [place], MD   9 months ago Hospital discharge follow-up   [place], Deborah B, MD   1 year ago Type 2 diabetes mellitus with stage 2 chronic kidney disease, unspecified whether long term insulin use ([place])   [place], Neoma Laming B, MD   1 year ago Type 2 diabetes mellitus with stage 2 chronic kidney disease, unspecified whether long term insulin use [facility])   [place], [place], [place]   1 year ago Need for vaccination against Streptococcus pneumoniae   [place], RPH-CPP      Future Appointments            In 1 month Wynetta Emery, Dalbert Batman, MD [place]  . pantoprazole (PROTONIX) 40 MG tablet [Pharmacy Med Name: PANTOPRAZOLE 40MG  TABLETS] 90 tablet     Sig: TAKE 1 TABLET(40 MG) BY MOUTH DAILY     Gastroenterology: Proton Pump Inhibitors Passed - 06/15/2021  6:20 AM      Passed - Valid encounter within last 12 months    Recent Outpatient Visits          3 months ago Syncope, unspecified syncope type   [place], MD   9 months ago Hospital  discharge follow-up   [place], Deborah B, MD   1 year ago Type 2 diabetes mellitus with stage 2 chronic kidney disease, unspecified whether long term insulin use ([place])   [place], Deborah B, MD   1 year ago Type 2 diabetes mellitus with stage 2 chronic kidney disease, unspecified whether long term insulin use [facility])   [place], [place], [place]   1 year ago Need for vaccination against Streptococcus pneumoniae   [place], RPH-CPP      Future Appointments            In 1 month Wynetta Emery, Dalbert Batman, MD [place]

## 2021-06-16 ENCOUNTER — Other Ambulatory Visit: Payer: Self-pay

## 2021-06-16 ENCOUNTER — Other Ambulatory Visit: Payer: Self-pay | Admitting: Pharmacist

## 2021-06-16 MED ORDER — HUMALOG MIX 75/25 (75-25) 100 UNIT/ML ~~LOC~~ SUSP: SUBCUTANEOUS | 2 refills | Status: DC

## 2021-06-20 ENCOUNTER — Other Ambulatory Visit: Payer: Self-pay

## 2021-06-22 ENCOUNTER — Other Ambulatory Visit: Payer: Self-pay | Admitting: Internal Medicine

## 2021-06-22 DIAGNOSIS — I5032 Chronic diastolic (congestive) heart failure: Secondary | ICD-10-CM

## 2021-06-23 ENCOUNTER — Other Ambulatory Visit: Payer: Self-pay | Admitting: Internal Medicine

## 2021-06-23 DIAGNOSIS — I5032 Chronic diastolic (congestive) heart failure: Secondary | ICD-10-CM

## 2021-06-24 ENCOUNTER — Other Ambulatory Visit: Payer: Self-pay

## 2021-06-24 DIAGNOSIS — J45909 Unspecified asthma, uncomplicated: Secondary | ICD-10-CM | POA: Diagnosis not present

## 2021-06-24 DIAGNOSIS — I10 Essential (primary) hypertension: Secondary | ICD-10-CM | POA: Diagnosis not present

## 2021-06-26 ENCOUNTER — Other Ambulatory Visit: Payer: Self-pay

## 2021-06-27 ENCOUNTER — Telehealth: Payer: Self-pay | Admitting: Internal Medicine

## 2021-06-27 NOTE — Telephone Encounter (Signed)
Copied from [place] [id number]. Topic: General - Other >> Jun 26, 2021  3:10 PM Tessa Lerner A wrote: Reason for CRM: The patient has returned a missed call from the practice  Please contact further when possible

## 2021-07-01 ENCOUNTER — Telehealth: Payer: Self-pay | Admitting: Neurology

## 2021-07-01 NOTE — Telephone Encounter (Signed)
Returned pt call and made aware that her 1/12 appt will be virtual. Pt doesn't have any questions or concerns

## 2021-07-01 NOTE — Telephone Encounter (Signed)
Patient is scheduled for Botox 07/08/21. [employer] Medicare requires PA for Botox. Obtained PA via [facility] portal. PA #[id number] (06/25/21- 06/25/22).

## 2021-07-07 ENCOUNTER — Other Ambulatory Visit: Payer: Self-pay | Admitting: Internal Medicine

## 2021-07-08 ENCOUNTER — Ambulatory Visit: Payer: Medicare Other | Admitting: Neurology

## 2021-07-08 ENCOUNTER — Other Ambulatory Visit: Payer: Self-pay | Admitting: Internal Medicine

## 2021-07-08 DIAGNOSIS — G43709 Chronic migraine without aura, not intractable, without status migrainosus: Secondary | ICD-10-CM | POA: Diagnosis not present

## 2021-07-08 NOTE — Telephone Encounter (Signed)
Medication: Accu-Chek FastClixMeter, Accu-Chek FastClix Lancets MISC [id number]   Has the patient contacted their pharmacy? NO (Agent: If no, request that the patient contact the pharmacy for the refill. If patient does not wish to contact the pharmacy document the reason why and proceed with request.) (Agent: If yes, when and what did the pharmacy advise?)  Preferred Pharmacy (with phone number or street name): [facility] [place], [place] [address] Phone: [phone] Fax: [phone] Hours: Open 24 hours   Has the patient been seen for an appointment in the last year OR does the patient have an upcoming appointment? YES 07/17/2021  Agent: Please be advised that RX refills may take up to 3 business days. We ask that you follow-up with your pharmacy.

## 2021-07-08 NOTE — Telephone Encounter (Signed)
Patient called in states all out of accu chec, meter and lancets,asking for temporary supply to [facility] [name] [address] Phone: [phone] Fax: [phone]

## 2021-07-08 NOTE — Progress Notes (Signed)
Botox consent signed  Botox- 200 units x 1 vial Lot: [id number] Expiration: 03/2024 NDC: 3200-3794-44  Bacteriostatic 0.9% Sodium Chloride- 44mL total Lot: [id number] Expiration: 02/04/2023 NDC: 2241-1464-31  Dx: U27.670 B/B

## 2021-07-08 NOTE — Progress Notes (Signed)
Interval history 07/08/2020 STABLE: Patient with chronic intractable migraines. Still doing exceptionally well on Botox for Migraines, only botox and topiramate have ever helped. Prior tried multiple classes of medications which did not help with headaches or migraines until she started Botox. Since starting botox she has had  >95% improvement in frequency and severity, had to redo one botox and she did well again. The migraines she does have are sparse, and only recur when the botox is wearing off the last 2 weeks but still are easier to treat and >60% less severe.Nothing has worked in the past except botox and Topiramate.  No masseters or any other additional injections  Consent Form Botulism Toxin Injection For Chronic Migraine    Reviewed orally with patient, additionally signature is on file:  Botulism toxin has been approved by the Federal drug administration for treatment of chronic migraine. Botulism toxin does not cure chronic migraine and it may not be effective in some patients.  The administration of botulism toxin is accomplished by injecting a small amount of toxin into the muscles of the neck and head. Dosage must be titrated for each individual. Any benefits resulting from botulism toxin tend to wear off after 3 months with a repeat injection required if benefit is to be maintained. Injections are usually done every 3-4 months with maximum effect peak achieved by about 2 or 3 weeks. Botulism toxin is expensive and you should be sure of what costs you will incur resulting from the injection.  The side effects of botulism toxin use for chronic migraine may include:   -Transient, and usually mild, facial weakness with facial injections  -Transient, and usually mild, head or neck weakness with head/neck injections  -Reduction or loss of forehead facial animation due to forehead muscle weakness  -Eyelid drooping  -Dry eye  -Pain at the site of injection or bruising at the site of  injection  -Double vision  -Potential unknown long term risks  Contraindications: You should not have Botox if you are pregnant, nursing, allergic to albumin, have an infection, skin condition, or muscle weakness at the site of the injection, or have myasthenia gravis, Lambert-Eaton syndrome, or ALS.  It is also possible that as with any injection, there may be an allergic reaction or no effect from the medication. Reduced effectiveness after repeated injections is sometimes seen and rarely infection at the injection site may occur. All care will be taken to prevent these side effects. If therapy is given over a long time, atrophy and wasting in the muscle injected may occur. Occasionally the patient's become refractory to treatment because they develop antibodies to the toxin. In this event, therapy needs to be modified.  I have read the above information and consent to the administration of botulism toxin.    BOTOX PROCEDURE NOTE FOR MIGRAINE HEADACHE    Contraindications and precautions discussed with patient(above). Aseptic procedure was observed and patient tolerated procedure. Procedure performed by Dr. Georgia Dom  The condition has existed for more than 6 months, and pt does not have a diagnosis of ALS, Myasthenia Gravis or Lambert-Eaton Syndrome.  Risks and benefits of injections discussed and pt agrees to proceed with the procedure.  Written consent obtained  These injections are medically necessary. Pt  receives good benefits from these injections. These injections do not cause sedations or hallucinations which the oral therapies may cause.  Description of procedure:  The patient was placed in a sitting position. The standard protocol was used for Botox as follows,  with 5 units of Botox injected at each site:   -Procerus muscle, midline injection  -Corrugator muscle, bilateral injection  -Frontalis muscle, bilateral injection, with 2 sites each side, medial injection was  performed in the upper one third of the frontalis muscle, in the region vertical from the medial inferior edge of the superior orbital rim. The lateral injection was again in the upper one third of the forehead vertically above the lateral limbus of the cornea, 1.5 cm lateral to the medial injection site.  -Temporalis muscle injection, 4 sites, bilaterally. The first injection was 3 cm above the tragus of the ear, second injection site was 1.5 cm to 3 cm up from the first injection site in line with the tragus of the ear. The third injection site was 1.5-3 cm forward between the first 2 injection sites. The fourth injection site was 1.5 cm posterior to the second injection site.   -Occipitalis muscle injection, 3 sites, bilaterally. The first injection was done one half way between the occipital protuberance and the tip of the mastoid process behind the ear. The second injection site was done lateral and superior to the first, 1 fingerbreadth from the first injection. The third injection site was 1 fingerbreadth superiorly and medially from the first injection site.  -Cervical paraspinal muscle injection, 2 sites, bilateral knee first injection site was 1 cm from the midline of the cervical spine, 3 cm inferior to the lower border of the occipital protuberance. The second injection site was 1.5 cm superiorly and laterally to the first injection site.  -Trapezius muscle injection was performed at 3 sites, bilaterally. The first injection site was in the upper trapezius muscle halfway between the inflection point of the neck, and the acromion. The second injection site was one half way between the acromion and the first injection site. The third injection was done between the first injection site and the inflection point of the neck.   Will return for repeat injection in 3 months.   155 units of Botox was used, 45U Botox not injected was wasted. The patient tolerated the procedure well, there were no  complications of the above procedure.

## 2021-07-09 ENCOUNTER — Other Ambulatory Visit: Payer: Self-pay | Admitting: Internal Medicine

## 2021-07-09 MED ORDER — ACCU-CHEK FASTCLIX LANCETS MISC: 6 refills | Status: DC

## 2021-07-09 MED ORDER — ACCU-CHEK GUIDE VI STRP: ORAL_STRIP | 6 refills | Status: DC

## 2021-07-09 MED ORDER — ACCU-CHEK GUIDE ME W/DEVICE KIT: PACK | 0 refills | Status: DC

## 2021-07-09 NOTE — Telephone Encounter (Signed)
Pt states she needs a new Accu check meter, a script  for strips and lancets. She is unable to check her sugar, and unable to take her insulin. Pt states this is very important and needs to get to [facility] on [address]

## 2021-07-10 NOTE — Telephone Encounter (Signed)
Both refilled 07/09/2021. Test strips #100/6. Lancets #102/6. Requested Prescriptions  Pending Prescriptions Disp Refills   glucose blood (ACCU-CHEK GUIDE) test strip [Pharmacy Med Name: ACCU-CHEK GUIDE TEST STRIPS 50] 100 strip     Sig: USE AS DIRECTED TO TEST BLOOD SUGAR THREE TIMES DAILY     Endocrinology: Diabetes - Testing Supplies Passed - 07/09/2021  8:22 AM      Passed - Valid encounter within last 12 months    Recent Outpatient Visits          4 months ago Syncope, unspecified syncope type   [place], MD   10 months ago Hospital discharge follow-up   [place], Deborah B, MD   1 year ago Type 2 diabetes mellitus with stage 2 chronic kidney disease, unspecified whether long term insulin use ([place] [facility])   [place], Neoma Laming B, MD   1 year ago Type 2 diabetes mellitus with stage 2 chronic kidney disease, unspecified whether long term insulin use [facility])   [place], [place], [place]   1 year ago Need for vaccination against Streptococcus pneumoniae   [place], Jarome Matin, RPH-CPP      Future Appointments            In 1 week Lad[place], MD [place] lancets [place] Med Name: [place] 100 each     Sig: USE AS DIRECTED TO TEST BLOOD [address]     Endocrinology: Diabetes - Testing Supplies Passed - 07/09/2021  8:22 AM      Passed - Valid encounter within last 12 months    Recent Outpatient Visits          4 months ago Syncope, unspecified syncope type   [place], MD   10 months ago Hospital discharge follow-up   [place], Deborah B, MD   1 year ago Type 2 diabetes mellitus with  stage 2 chronic kidney disease, unspecified whether long term insulin use ([place])   [place], Neoma Laming B, MD   1 year ago Type 2 diabetes mellitus with stage 2 chronic kidney disease, unspecified whether long term insulin use [facility])   [place], [place], [place]   1 year ago Need for vaccination against Streptococcus pneumoniae   [place], RPH-CPP      Future Appointments            In 1 week Ladell Pier, MD [place]

## 2021-07-14 ENCOUNTER — Telehealth: Payer: Self-pay

## 2021-07-14 NOTE — Telephone Encounter (Signed)
Contacted pt to schedule medicare wellness pt didn't answer lvm

## 2021-07-17 ENCOUNTER — Ambulatory Visit: Payer: Medicare Other | Attending: Internal Medicine | Admitting: Internal Medicine

## 2021-07-17 DIAGNOSIS — Z91199 Patient's noncompliance with other medical treatment and regimen due to unspecified reason: Secondary | ICD-10-CM

## 2021-07-17 NOTE — Progress Notes (Signed)
Patient no showed for this telephone visit.  

## 2021-07-25 DIAGNOSIS — J45909 Unspecified asthma, uncomplicated: Secondary | ICD-10-CM | POA: Diagnosis not present

## 2021-07-25 DIAGNOSIS — I10 Essential (primary) hypertension: Secondary | ICD-10-CM | POA: Diagnosis not present

## 2021-07-31 ENCOUNTER — Other Ambulatory Visit: Payer: Self-pay | Admitting: Internal Medicine

## 2021-07-31 NOTE — Telephone Encounter (Signed)
Requested Prescriptions  Pending Prescriptions Disp Refills   famotidine (PEPCID) 20 MG tablet [Pharmacy Med Name: FAMOTIDINE 20MG  TABLETS] 60 tablet 2    Sig: TAKE 1 TABLET(20 MG) BY MOUTH TWICE DAILY     Gastroenterology:  H2 Antagonists Passed - 07/31/2021  6:10 AM      Passed - Valid encounter within last 12 months    Recent Outpatient Visits          2 weeks ago No-show for appointment   [place] Lad[place], MD   4 months ago Syncope, unspecified syncope type   [place], MD   11 months ago Hospital discharge follow-up   [place], Deborah B, MD   1 year ago Type 2 diabetes mellitus with stage 2 chronic kidney disease, unspecified whether long term insulin use [facility]   [place], Deborah B, MD   1 year ago Type 2 diabetes mellitus with stage 2 chronic kidney disease, unspecified whether long term insulin use [facility])   [place], [place], [place]

## 2021-08-07 ENCOUNTER — Other Ambulatory Visit: Payer: Self-pay | Admitting: Internal Medicine

## 2021-08-07 DIAGNOSIS — I5032 Chronic diastolic (congestive) heart failure: Secondary | ICD-10-CM

## 2021-08-16 ENCOUNTER — Other Ambulatory Visit: Payer: Self-pay | Admitting: Internal Medicine

## 2021-08-18 NOTE — Telephone Encounter (Signed)
Requested medication (s) are due for refill today: Yes  Requested medication (s) are on the active medication list: Yes  Last refill:  07/07/21  Future visit scheduled: No  Notes to clinic:  No show for last appointment.    Requested Prescriptions  Pending Prescriptions Disp Refills   topiramate (TOPAMAX) 25 MG capsule [Pharmacy Med Name: TOPIRAMATE 25MG  SPRINKLE CAPSULES] 180 capsule 0    Sig: TAKE 3 CAPSULES(75 MG) BY MOUTH TWICE DAILY     Neurology: Anticonvulsants - topiramate & zonisamide Failed - 08/16/2021  9:03 AM      Failed - Cr in normal range and within 360 days    Creat  Date Value Ref Range Status  05/12/2016 1.14 (H) 0.50 - 0.99 mg/dL Final    Comment:      For patients > or = 70 years of age: The upper reference limit for Creatinine is approximately 13% higher for people identified as African-American.      Creatinine, Ser  Date Value Ref Range Status  05/11/2021 1.02 (H) 0.44 - 1.00 mg/dL Final   Creatinine, Urine  Date Value Ref Range Status  11/27/2019 146.00 mg/dL Final    Comment:    Performed at [facility], [place] [address] [place], [place] [address]          Passed - CO2 in normal range and within 360 days    CO2  Date Value Ref Range Status  05/11/2021 29 22 - 32 mmol/L Final   Bicarbonate  Date Value Ref Range Status  01/27/2021 30.9 (H) 20.0 - 28.0 mmol/L Final          Passed - ALT in normal range and within 360 days    ALT  Date Value Ref Range Status  01/26/2021 18 0 - 44 U/L Final          Passed - AST in normal range and within 360 days    AST  Date Value Ref Range Status  01/26/2021 16 15 - 41 U/L Final          Passed - Completed PHQ-2 or PHQ-9 in the last 360 days      Passed - Valid encounter within last 12 months    Recent Outpatient Visits           1 month ago No-show for appointment   [place] Ladell Pier, MD   5 months ago Syncope, unspecified syncope type    [place], MD   11 months ago Hospital discharge follow-up   [place], Deborah B, MD   1 year ago Type 2 diabetes mellitus with stage 2 chronic kidney disease, unspecified whether long term insulin use [facility])   [place], Deborah B, MD   1 year ago Type 2 diabetes mellitus with stage 2 chronic kidney disease, unspecified whether long term insulin use [facility])   [place], [place], [place]

## 2021-08-19 ENCOUNTER — Telehealth (HOSPITAL_BASED_OUTPATIENT_CLINIC_OR_DEPARTMENT_OTHER): Payer: Medicare Other | Admitting: Nurse Practitioner

## 2021-08-19 ENCOUNTER — Encounter: Payer: Self-pay | Admitting: Nurse Practitioner

## 2021-08-19 DIAGNOSIS — E1165 Type 2 diabetes mellitus with hyperglycemia: Secondary | ICD-10-CM | POA: Diagnosis not present

## 2021-08-19 DIAGNOSIS — E785 Hyperlipidemia, unspecified: Secondary | ICD-10-CM | POA: Diagnosis not present

## 2021-08-19 DIAGNOSIS — I5032 Chronic diastolic (congestive) heart failure: Secondary | ICD-10-CM | POA: Diagnosis not present

## 2021-08-19 NOTE — Progress Notes (Signed)
Virtual Visit via Telephone Note Due to national recommendations of social distancing due to [place] 19, telehealth visit is felt to be most appropriate for this patient at this time.  I discussed the limitations, risks, security and privacy concerns of performing an evaluation and management service by telephone and the availability of in person appointments. I also discussed with the patient that there may be a patient responsible charge related to this service. The patient expressed understanding and agreed to proceed.    I connected with Heidi [name] on 08/19/21  at   1:30 PM EST  EDT by telephone and verified that I am speaking with the correct person using two identifiers.  Location of Patient: Private Residence   Location of Provider: [place] and [employer] Office    Persons participating in Telemedicine visit: [name] FNP-BC Elka Satterfield    History of Present Illness: Telemedicine visit for: Medication refills,  She has a past medical history of Adenomatous colon polyp, Anemia, Anxiety, Asthma, CHF, COPD, DM2, High cholesterol, Hypertension, Left knee injury, Pneumonia, Sleep apnea, and Supplemental oxygen dependent.   DM 2 States she has been administering Humalog 75/25 as instructed as well as administering on a sliding scale which was not prescribed by her PCP. She is aware she should not be taking Humalog 75/25 on sliding scale.  Lipid not at goal with atorvastatin 40 mg daily. States she is doing just fine and managing her diabetes well although she has no recorded blood glucose logs today and last A1c >10. Lab Results  Component Value Date   HGBA1C 10.3 (H) 01/28/2021    Lab Results  Component Value Date   LDLCALC 55 01/28/2021    CHF Requesting refill of furosemide. Denies worsening shortness of breath, bilateral lower extremity edema or chest pain.    Past Medical History:  Diagnosis Date   Adenomatous colon polyp    Anemia     Anxiety    Asthma    CHF (congestive heart failure) (HCC)    COPD (chronic obstructive pulmonary disease) ([place])    Diabetes mellitus without complication ([place])    High cholesterol    Hypertension    Left knee injury    Pneumonia    Sleep apnea    Supplemental oxygen dependent    2L [place]    Past Surgical History:  Procedure Laterality Date   ABDOMINAL HYSTERECTOMY     CATARACT EXTRACTION Left    CATARACT EXTRACTION     LT 01/2017, 02/2017 on RT   CESAREAN SECTION     COLONOSCOPY WITH PROPOFOL N/A 04/15/2021   Procedure: COLONOSCOPY WITH PROPOFOL;  Surgeon: Irene Shipper, MD;  Location: [facility];  Service: Endoscopy;  Laterality: N/A;  patient is on O2   JOINT REPLACEMENT Left    Plate in Ankle   POLYPECTOMY  04/15/2021   Procedure: POLYPECTOMY;  Surgeon: Irene Shipper, MD;  Location: [facility];  Service: Endoscopy;;    Family History  Problem Relation Age of Onset   Colon cancer Father    Diabetes type II Sister    Diabetes type II Brother    Colon cancer Sister    Migraines Neg Hx    Breast cancer Neg Hx     Social History   Socioeconomic History   Marital status: Widowed    Spouse name: Not on file   Number of children: 4   Years of education: Not on file   Highest education level: Not on file  Occupational History   Occupation: Disabled  Tobacco Use   Smoking status: Never    Passive exposure: Never   Smokeless tobacco: Never  Vaping Use   Vaping Use: Never used  Substance and Sexual Activity   Alcohol use: No   Drug use: No   Sexual activity: Yes  Other Topics Concern   Not on file  Social History Narrative   Lives with daughter   Caffeine use: 1 cup coffee per day   Social Determinants of Health   Financial Resource Strain: Not on file  Food Insecurity: Not on file  Transportation Needs: Not on file  Physical Activity: Not on file  Stress: Not on file  Social Connections: Not on file     Observations/Objective: Awake, alert and oriented  x 3   Review of Systems  Constitutional:  Negative for fever, malaise/fatigue and weight loss.  HENT: Negative.  Negative for nosebleeds.   Eyes: Negative.  Negative for blurred vision, double vision and photophobia.  Respiratory: Negative.  Negative for cough and shortness of breath.   Cardiovascular: Negative.  Negative for chest pain, palpitations and leg swelling.  Gastrointestinal: Negative.  Negative for heartburn, nausea and vomiting.  Musculoskeletal: Negative.  Negative for myalgias.  Neurological: Negative.  Negative for dizziness, focal weakness, seizures and headaches.  Psychiatric/Behavioral: Negative.  Negative for suicidal ideas.    Assessment and Plan: Diagnoses and all orders for this visit:  Uncontrolled type 2 diabetes mellitus with hyperglycemia ([place]) -     Hemoglobin A1c -     CMP14+EGFR -     Ambulatory referral to Ophthalmology Continue blood sugar control as discussed in office today, low carbohydrate diet, and regular physical exercise as tolerated, 150 minutes per week (30 min each day, 5 days per week, or 50 min 3 days per week). Keep blood sugar logs with fasting goal of 90-130 mg/dl, post prandial (after you eat) less than 180.    Chronic diastolic congestive heart failure ([place]) Will send furosemide when labs obtained.    Dyslipidemia, goal LDL below 70 -     Lipid panel INSTRUCTIONS: Work on a low fat, heart healthy diet and participate in regular aerobic exercise program by working out at least 150 minutes per week; 5 days a week-30 minutes per day. Avoid red meat/beef/steak,  fried foods. junk foods, sodas, sugary drinks, unhealthy snacking, alcohol and smoking.  Drink at least 80 oz of water per day and monitor your carbohydrate intake daily.       Follow Up Instructions Return if symptoms worsen or fail to improve.     I discussed the assessment and treatment plan with the patient. The patient was provided an opportunity to ask questions and all  were answered. The patient agreed with the plan and demonstrated an understanding of the instructions.   The patient was advised to call back or seek an in-person evaluation if the symptoms worsen or if the condition fails to improve as anticipated.  I provided 12 minutes of non-face-to-face time during this encounter including median intraservice time, reviewing previous notes, labs, imaging, medications and explaining diagnosis and management.  Gildardo Pounds, FNP-BC

## 2021-08-20 ENCOUNTER — Ambulatory Visit: Payer: Medicare Other | Admitting: Adult Health

## 2021-08-21 ENCOUNTER — Other Ambulatory Visit: Payer: Self-pay

## 2021-08-21 ENCOUNTER — Ambulatory Visit: Payer: Medicare Other | Attending: Internal Medicine

## 2021-08-21 DIAGNOSIS — E785 Hyperlipidemia, unspecified: Secondary | ICD-10-CM | POA: Diagnosis not present

## 2021-08-21 DIAGNOSIS — E1165 Type 2 diabetes mellitus with hyperglycemia: Secondary | ICD-10-CM | POA: Diagnosis not present

## 2021-08-22 ENCOUNTER — Other Ambulatory Visit: Payer: Self-pay

## 2021-08-22 ENCOUNTER — Telehealth: Payer: Self-pay

## 2021-08-22 LAB — CMP14+EGFR
ALT: 19 IU/L (ref 0–32)
AST: 17 IU/L (ref 0–40)
Albumin/Globulin Ratio: 2.1 (ref 1.2–2.2)
Albumin: 4.4 g/dL (ref 3.8–4.8)
Alkaline Phosphatase: 134 IU/L — ABNORMAL HIGH (ref 44–121)
BUN/Creatinine Ratio: 18 (ref 12–28)
BUN: 17 mg/dL (ref 8–27)
Bilirubin Total: <0.2 mg/dL (ref 0.0–1.2)
CO2: 26 mmol/L (ref 20–29)
Calcium: 9.6 mg/dL (ref 8.7–10.3)
Chloride: 107 mmol/L — ABNORMAL HIGH (ref 96–106)
Creatinine, Ser: 0.97 mg/dL (ref 0.57–1.00)
Globulin, Total: 2.1 g/dL (ref 1.5–4.5)
Glucose: 75 mg/dL (ref 70–99)
Potassium: 4.2 mmol/L (ref 3.5–5.2)
Sodium: 142 mmol/L (ref 134–144)
Total Protein: 6.5 g/dL (ref 6.0–8.5)
eGFR: 63 mL/min/{1.73_m2} (ref >59)

## 2021-08-22 LAB — HEMOGLOBIN A1C
Est. average glucose Bld gHb Est-mCnc: 252 mg/dL
Hgb A1c MFr Bld: 10.4 % — ABNORMAL HIGH (ref 4.8–5.6)

## 2021-08-22 LAB — LIPID PANEL
Chol/HDL Ratio: 2.3 ratio (ref 0.0–4.4)
Cholesterol, Total: 120 mg/dL (ref 100–199)
HDL: 53 mg/dL (ref >39)
LDL Chol Calc (NIH): 50 mg/dL (ref 0–99)
Triglycerides: 91 mg/dL (ref 0–149)
VLDL Cholesterol Cal: 17 mg/dL (ref 5–40)

## 2021-08-22 NOTE — Telephone Encounter (Signed)
Contacted pt to go over lab results pt is aware   Pt states she is suppose to get refills on her medications after she had got her labs done.  Please follow up pt uses [facility]

## 2021-08-23 ENCOUNTER — Other Ambulatory Visit: Payer: Self-pay | Admitting: Nurse Practitioner

## 2021-08-23 DIAGNOSIS — I10 Essential (primary) hypertension: Secondary | ICD-10-CM

## 2021-08-23 MED ORDER — FAMOTIDINE 20 MG PO TABS
20.0000 mg | ORAL_TABLET | Freq: Every day | ORAL | 2 refills | Status: DC
  Filled 2021-08-23: qty 60, 60d supply, fill #0

## 2021-08-23 MED ORDER — ATENOLOL 25 MG PO TABS
25.0000 mg | ORAL_TABLET | Freq: Every day | ORAL | 2 refills | Status: DC
  Filled 2021-08-23: qty 30, 30d supply, fill #0

## 2021-08-23 MED ORDER — HUMALOG MIX 75/25 (75-25) 100 UNIT/ML ~~LOC~~ SUSP
SUBCUTANEOUS | 2 refills | Status: DC
  Filled 2021-08-23: qty 100, fill #0
  Filled 2021-08-26: qty 30, 26d supply, fill #0
  Filled ????-??-??: fill #0

## 2021-08-23 MED ORDER — LOSARTAN POTASSIUM 50 MG PO TABS
50.0000 mg | ORAL_TABLET | Freq: Every day | ORAL | 0 refills | Status: DC
  Filled 2021-08-23: qty 90, 90d supply, fill #0

## 2021-08-23 MED ORDER — FLUTICASONE-SALMETEROL 115-21 MCG/ACT IN AERO
2.0000 | INHALATION_SPRAY | Freq: Two times a day (BID) | RESPIRATORY_TRACT | 6 refills | Status: DC
  Filled 2021-08-23: qty 12, 30d supply, fill #0

## 2021-08-25 ENCOUNTER — Other Ambulatory Visit: Payer: Self-pay

## 2021-08-25 ENCOUNTER — Telehealth: Payer: Self-pay

## 2021-08-25 DIAGNOSIS — J45909 Unspecified asthma, uncomplicated: Secondary | ICD-10-CM | POA: Diagnosis not present

## 2021-08-25 DIAGNOSIS — I10 Essential (primary) hypertension: Secondary | ICD-10-CM | POA: Diagnosis not present

## 2021-08-25 NOTE — Telephone Encounter (Signed)
Lurena Joiner would you be able to follow up with pt

## 2021-08-25 NOTE — Telephone Encounter (Signed)
Patient states she will run out this evening and would like a phone call as soon as possible. Patient states she might have 30 units for the morning. Patient would like to know if PCP has samples because she does not have any money to buy any insulin, please advise

## 2021-08-25 NOTE — Telephone Encounter (Signed)
Copied from [place] [phone]. Topic: General - Other >> Aug 22, 2021  4:35 PM Tessa Lerner A wrote: Reason for CRM: The patient shares that they would like to discuss their insulin concerns with a member of clinical staff when possible  The patient is concerned with not having enough to make it until march   Please contact further when possible

## 2021-08-26 ENCOUNTER — Other Ambulatory Visit: Payer: Self-pay

## 2021-08-26 NOTE — Telephone Encounter (Signed)
Pt calling back and said she is out of medication. She would like the dr to re write the prescription. She said she knows it can be rewritten. Pt states she HAS to have her insulin.  She would like something to be done b/c she does not have $35 to pay.

## 2021-08-26 NOTE — Telephone Encounter (Signed)
Will forward to provider  

## 2021-08-26 NOTE — Telephone Encounter (Signed)
Received message from [employer].   just an fyi on Heidi [name].  we can fill her insulin early she would just have to pay her $35 copay because she is eligible for free meds the copay would have to be paid.  Her free meds can't be ordered until the 1st and then time to allow for the meds to be received.  If she is not taking it as prescribed then that is fine, she will just have to pay the copays when she comes up short-her choice.

## 2021-08-26 NOTE — Telephone Encounter (Signed)
Pharmacy reached out to the patient. Based on her dosing, pt should still have 2.5 weeks left of her current supply. Pt is adamant that she is almost out. She has a hx of self-dosing and per pharmacy, her daughter will sometimes self dose her insulin as well. She has been advised not to self dose as this causes her to run out. It is unclear what dose she seems to be taking. Pt and her daughter may need a visit to be advised in person. Pharmacy would have to charge a copay if she fills early.

## 2021-08-27 ENCOUNTER — Other Ambulatory Visit: Payer: Self-pay

## 2021-08-27 NOTE — Telephone Encounter (Signed)
Patient aware of message per Dr. Wynetta Emery.  Left message on voicemail per DPR.

## 2021-08-28 ENCOUNTER — Other Ambulatory Visit: Payer: Self-pay | Admitting: Internal Medicine

## 2021-08-28 ENCOUNTER — Other Ambulatory Visit: Payer: Self-pay

## 2021-08-28 MED ORDER — HUMALOG MIX 75/25 (75-25) 100 UNIT/ML ~~LOC~~ SUSP
SUBCUTANEOUS | 2 refills | Status: DC
  Filled 2021-08-28: qty 40, 29d supply, fill #0
  Filled 2021-09-23: qty 40, 29d supply, fill #1
  Filled ????-??-??: fill #2

## 2021-08-28 NOTE — Telephone Encounter (Signed)
Left message on patient voicemail that there are appts available on next Monday. She did not answer her phone.   Any available agent that is able to view the schedule for [facility], please make sooner appt with Dr. Wynetta Emery. There are currently 4 available for Monday 09/01/2021, 0950, 1110, 1450 and 1510.

## 2021-08-28 NOTE — Telephone Encounter (Signed)
Pt returned call to schedule a sooner appt, please advise.

## 2021-08-29 ENCOUNTER — Other Ambulatory Visit: Payer: Self-pay

## 2021-08-29 NOTE — Telephone Encounter (Signed)
Appt scheduled on 09/04/2021

## 2021-09-02 ENCOUNTER — Other Ambulatory Visit: Payer: Self-pay

## 2021-09-03 ENCOUNTER — Other Ambulatory Visit: Payer: Self-pay

## 2021-09-04 ENCOUNTER — Ambulatory Visit: Payer: Medicare Other | Attending: Internal Medicine | Admitting: Internal Medicine

## 2021-09-04 ENCOUNTER — Telehealth: Payer: Self-pay

## 2021-09-04 ENCOUNTER — Other Ambulatory Visit: Payer: Self-pay

## 2021-09-04 ENCOUNTER — Encounter: Payer: Self-pay | Admitting: Internal Medicine

## 2021-09-04 DIAGNOSIS — G4733 Obstructive sleep apnea (adult) (pediatric): Secondary | ICD-10-CM | POA: Diagnosis not present

## 2021-09-04 DIAGNOSIS — J9611 Chronic respiratory failure with hypoxia: Secondary | ICD-10-CM

## 2021-09-04 DIAGNOSIS — Z9981 Dependence on supplemental oxygen: Secondary | ICD-10-CM

## 2021-09-04 DIAGNOSIS — Z23 Encounter for immunization: Secondary | ICD-10-CM

## 2021-09-04 DIAGNOSIS — I5032 Chronic diastolic (congestive) heart failure: Secondary | ICD-10-CM | POA: Diagnosis not present

## 2021-09-04 DIAGNOSIS — Z2821 Immunization not carried out because of patient refusal: Secondary | ICD-10-CM | POA: Diagnosis not present

## 2021-09-04 DIAGNOSIS — J4540 Moderate persistent asthma, uncomplicated: Secondary | ICD-10-CM | POA: Diagnosis not present

## 2021-09-04 DIAGNOSIS — I10 Essential (primary) hypertension: Secondary | ICD-10-CM

## 2021-09-04 DIAGNOSIS — E1169 Type 2 diabetes mellitus with other specified complication: Secondary | ICD-10-CM

## 2021-09-04 DIAGNOSIS — L600 Ingrowing nail: Secondary | ICD-10-CM | POA: Diagnosis not present

## 2021-09-04 NOTE — Progress Notes (Signed)
Patient ID: Heidi [name], female    DOB: [birth year]  MRN: [id number]  CC: Diabetes, Neck Pain, Back Pain, and Leg Pain (B/l)   Subjective: Heidi [name] is a 71 y.o. female who presents for chronic ds management Her concerns today include:  history of HTN, DM 2 with retinopathy, HL, CKD 3, diastolic CHF, hypoxic respiratory failure/cough variant asthma 2 L O2 continuously, migraines ([facility] on Botox inj), OSA (declines CPAP or repeat sleep study), UACS, GERD, OA knees,  DDD/spondylosis of cervical spine,obesity, renal cyst, LT thyroid nodule.  DIABETES TYPE 2 Last A1C:   Lab Results  Component Value Date   HGBA1C 10.4 (H) 08/21/2021  Med Adherence:  Pt has been requesting early refills on insulin.  Currently taking Humalog 75/25 80/40 after I sent in rxn last wk with instructions for 80 units a.m/45-50 units in p.m.  Prior to the most recent rxn pt states she was taking 70 a.m/40 p.m Medication side effects:  _0  Yes    _1  No Home Monitoring?  _2  Yes checking 3x/day before meals.  Brought wrong log book  Home glucose results range: Reports Range before BF 109-130, before 80-90 and 150 before dinner Diet Adherence: reports she is doing better with eating habits - less bread, drinking more water, eating bake or broil meats, more fruits and veggies Exercise: "I need to do better with that." Hypoglycemic episodes?: _3  Yes  -3-4x in past 2 mths.  Lowest was 50.  She can tell when BS low.  She keeps something with her at all times to eat in the event blood sugar drops low when she is not at home Numbness of the feet? _4  Yes    _5  No Retinopathy hx? _6  Yes    _7  No Last eye exam: has appt end of March Comments:   HYPERTENSION/CHF Currently taking: see medication list.  She is on furosemide, atenolol, Cozaar Med Adherence: _8  Yes -Atenolol, Losartan and Furosemide Medication side effects: _9  Yes    _10  No Adherence with salt restriction: _11  Yes    _12  No Home  Monitoring?: _13  Yes    _14  No Monitoring Frequency:  Home BP results range:  SOB? _15  Yes    _16  No Chest Pain?: _17  Yes    _18  No Leg swelling?: _19  Yes    _20  No Headaches?: _21  Yes    _22  No Dizziness? _23  Yes    _24  No Comments:   Hypoxic resp failure/Asthma:  worse in aircondition at nights Needs O2 with exertion and at nights. Oxygen at rest  and walking in her house without O2 is around 93% Takes Singulair.  Uses Advair 1-2 x/day.  Uses Neb when she has flair but not every day She last saw Dr. Shearon Stalls 10/2020. Pt never called to reschedule her sleep study as was discussed on last visit.  She thinks CPAP "missed up my lungs."    She thinks she has an infection in her right big toe.  States that she has been nursing it and soaking it and it looks a lot better.  Would like for me to complete form for her to have student loans dismissed because she is unable to work.  Patient states she has sent them completed forms before but they keep sending it back to her.  HM:  declines flu shot.  Agrees for shingles vaccine.  Declines DEXA scan at this time. Patient Active Problem List   Diagnosis Date Noted   History of colonic polyps  Benign neoplasm of ascending colon    Benign neoplasm of transverse colon    Benign neoplasm of descending colon    Postural dizziness with presyncope 02/10/2021   Acute encephalopathy 01/27/2021   Fall at home, initial encounter 01/27/2021   Pneumonia due to COVID-19 virus 08/10/2020   Encephalopathy due to COVID-19 virus 08/10/2020   CAP (community acquired pneumonia) 08/10/2020   Acute respiratory failure with hypoxia and hypercapnia ([place]) 08/10/2020   Acute kidney injury superimposed on CKD ([place]) 08/10/2020   COVID 08/10/2020   Encounter for medication review and counseling 03/18/2020   Medication management 02/28/2020   Exertional dyspnea 01/03/2020   Multifocal pneumonia 11/24/2019   Acute respiratory failure ([place]) 11/24/2019   Breast nodule  11/24/2019   Chronic respiratory failure with hypoxia, on home oxygen therapy ([place]) 11/13/2019   Atypical chest pain 08/24/2019   Cervical myelopathy ([place]) 08/16/2018   Diabetic retinopathy of right eye associated with type 2 diabetes mellitus ([place]) 08/16/2018   Upper airway cough syndrome 05/03/2018   Morbid obesity due to excess calories ([place]) complicated by hbp/ dm / hyperlipidemia 09/10/2017   Cough variant asthma vs UACS/vcd on ACEi  09/09/2017   Anxiety and depression 09/06/2017   Mild persistent asthma without complication 93/73/4287   Paranoia ([place]) 09/06/2017   Torn left ear lobe 11/20/2016   Chronic migraine w/o aura w/o status migrainosus, not intractable 06/27/2016   Post-menopausal atrophic vaginitis 11/14/2015   Primary osteoarthritis of both knees 07/18/2015   GERD (gastroesophageal reflux disease) 05/08/2015   Renal cyst 05/08/2015   Adenomatous polyp of colon 03/26/2015   Essential hypertension 02/12/2015   Cognitive deficit due to old head trauma 02/12/2015   Asthma, chronic 02/12/2015   Chronic left shoulder pain 02/11/2015   Thyroid nodule 12/18/2014   Chronic neck pain    HLD (hyperlipidemia)    OSA treated with BiPAP 11/27/2014   Obesity hypoventilation syndrome ([place]) 11/27/2014   Demand ischemia ([place])    Uncontrolled type 2 diabetes mellitus with hyperglycemia ([place])    Cardiomegaly 11/25/2014   CHF (congestive heart failure) ([place]) 11/25/2014   DM type 2 causing CKD stage 2 ([place]) 11/25/2014     Current Outpatient Medications on File Prior to Visit  Medication Sig Dispense Refill   Accu-Chek FastClix Lancets MISC Use as directed to test blood sugar three times daily DX E11.65 102 each 6   acetaminophen (TYLENOL) 500 MG tablet Take 500-1,000 mg by mouth every 6 (six) hours as needed (pain).     albuterol (PROVENTIL) (2.5 MG/3ML) 0.083% nebulizer solution USE 1 VIAL VIA NEBULIZER EVERY 6 HOURS AS NEEDED FOR WHEEZING OR SHORTNESS OF BREATH 25 vial 0    albuterol (VENTOLIN HFA) 108 (90 Base) MCG/ACT inhaler Inhale 1-2 puffs into the lungs every 4 (four) hours as needed for wheezing or shortness of breath. 8 g 5   atenolol (TENORMIN) 25 MG tablet Take 1 tablet (25 mg total) by mouth daily. 30 tablet 2   atorvastatin (LIPITOR) 40 MG tablet TAKE 1 TABLET BY MOUTH DAILY AT 6 PM 90 tablet 1   Blood Glucose Monitoring Suppl (ACCU-CHEK GUIDE ME) w/Device KIT Use as directed to test blood sugar three times daily DX E11.65 1 kit 0   famotidine (PEPCID) 20 MG tablet Take 1 tablet (20 mg total) by mouth daily. 60 tablet 2   fluticasone-salmeterol (ADVAIR HFA) 115-21 MCG/ACT inhaler Inhale 2 puffs into the lungs 2 (two) times daily. 12 g 6   furosemide (LASIX) 40 MG tablet TAKE  1 TABLET(40 MG) BY MOUTH DAILY 30 tablet 0   glucose blood (ACCU-CHEK GUIDE) test strip Use as directed to test blood sugar three times daily DX E11.65 100 each 6   guaiFENesin (MUCINEX) 600 MG 12 hr tablet Take 600 mg by mouth in the morning. (Patient not taking: Reported on 09/04/2021)     insulin lispro protamine-lispro (HUMALOG MIX 75/25) (75-25) 100 UNIT/ML SUSP injection Inject 80 units in the morning and 45-55 units in the evening. 100 mL 2   INSULIN SYRINGE 1CC/29G 29G X 1/2" 1 ML MISC USE THREE TIMES DAILY AS DIRECTED 100 each 3   loratadine (CLARITIN) 10 MG tablet Take 1 tablet (10 mg total) by mouth daily as needed for allergies. 30 tablet 3   losartan (COZAAR) 50 MG tablet Take 1 tablet (50 mg total) by mouth daily. 90 tablet 0   methocarbamol (ROBAXIN) 500 MG tablet Take 1 tablet (500 mg total) by mouth every 8 (eight) hours as needed for muscle spasms. 90 tablet 0   montelukast (SINGULAIR) 10 MG tablet Take 1 tablet (10 mg total) by mouth at bedtime. 90 tablet 2   Multiple Vitamin (MULTIVITAMIN WITH MINERALS) TABS tablet Take 1 tablet by mouth in the morning. Centrum Silver     nitroGLYCERIN (NITROSTAT) 0.3 MG SL tablet Place 1 tablet (0.3 mg total) under the tongue every 5  (five) minutes as needed for chest pain. Max 3 tablets in 15 minutes 30 tablet 1   OXYGEN Inhale 2 L into the lungs continuous.     Potassium Chloride ER 20 MEQ TBCR TAKE 1 TABLET BY MOUTH DAILY 90 tablet 3   Pseudoeph-Doxylamine-DM-APAP (NYQUIL PO) Take 15-30 mLs by mouth daily as needed (cough/respiratory issues.). (Patient not taking: Reported on 09/04/2021)     SUPREP BOWEL PREP KIT 17.5-3.13-1.6 GM/177ML SOLN Take 1 kit by mouth as directed. For colonoscopy prep 354 mL 0   tetrahydrozoline-zinc (VISINE-AC) 0.05-0.25 % ophthalmic solution Place 1 drop into both eyes 3 (three) times daily as needed (dry / irritated eyes).     topiramate (TOPAMAX) 25 MG capsule TAKE 3 CAPSULES(75 MG) BY MOUTH TWICE DAILY 180 capsule 2   No current facility-administered medications on file prior to visit.    Allergies  Allergen Reactions   Januvia [Sitagliptin]     Chest pains   Tramadol Other (See Comments)    hallucinations    Social History   Socioeconomic History   Marital status: Widowed    Spouse name: Not on file   Number of children: 4   Years of education: Not on file   Highest education level: Not on file  Occupational History   Occupation: Disabled  Tobacco Use   Smoking status: Never    Passive exposure: Never   Smokeless tobacco: Never  Vaping Use   Vaping Use: Never used  Substance and Sexual Activity   Alcohol use: No   Drug use: No   Sexual activity: Yes  Other Topics Concern   Not on file  Social History Narrative   Lives with daughter   Caffeine use: 1 cup coffee per day   Social Determinants of Health   Financial Resource Strain: Not on file  Food Insecurity: Not on file  Transportation Needs: Not on file  Physical Activity: Not on file  Stress: Not on file  Social Connections: Not on file  Intimate Partner Violence: Not on file    Family History  Problem Relation Age of Onset   Colon cancer Father  Diabetes type II Sister    Diabetes type II Brother     Colon cancer Sister    Migraines Neg Hx    Breast cancer Neg Hx     Past Surgical History:  Procedure Laterality Date   ABDOMINAL HYSTERECTOMY     CATARACT EXTRACTION Left    CATARACT EXTRACTION     LT 01/2017, 02/2017 on RT   CESAREAN SECTION     COLONOSCOPY WITH PROPOFOL N/A 04/15/2021   Procedure: COLONOSCOPY WITH PROPOFOL;  Surgeon: Irene Shipper, MD;  Location: [facility];  Service: Endoscopy;  Laterality: N/A;  patient is on O2   JOINT REPLACEMENT Left    Plate in Ankle   POLYPECTOMY  04/15/2021   Procedure: POLYPECTOMY;  Surgeon: Irene Shipper, MD;  Location: [facility];  Service: Endoscopy;;    ROS: Review of Systems Negative except as stated above  PHYSICAL EXAM: BP 112/76    Pulse 93    Resp 16    Wt 239 lb 9.6 oz (108.7 kg)    SpO2 90%    BMI 41.78 kg/m   On 2Lt O2 Physical Exam  General appearance -older African-American female sitting on exam table in NAD.  She has her portable oxygen with her and has it on. Mental status -patient a little forgetful. Neck - supple, no significant adenopathy Chest -breath sounds slightly decreased bilaterally but with adequate air entry.  No wheezes or crackles Heart - normal rate, regular rhythm, normal S1, S2, no murmurs, rubs, clicks or gallops Extremities -no edema in the lower legs. Diabetic Foot Exam - Simple   Simple Foot Form Visual Inspection See comments: Yes Sensation Testing Intact to touch and monofilament testing bilaterally: Yes Pulse Check Posterior Tibialis and Dorsalis pulse intact bilaterally: Yes Comments Mild edema of the dorsal surface of both feet.  She has mild hyperpigmentation on the medial aspect of the right and LT big toenail both toenails her mildly ingrown.      CMP Latest Ref Rng & Units 08/21/2021 05/11/2021 01/27/2021  Glucose 70 - 99 mg/dL 75 135(H) -  BUN 8 - 27 mg/dL 17 7(L) -  Creatinine 0.57 - 1.00 mg/dL 0.97 1.02(H) -  Sodium 134 - 144 mmol/L 142 143 140  Potassium 3.5 - 5.2  mmol/L 4.2 3.9 4.0  Chloride 96 - 106 mmol/L 107(H) 107 -  CO2 20 - 29 mmol/L 26 29 -  Calcium 8.7 - 10.3 mg/dL 9.6 8.9 -  Total Protein 6.0 - 8.5 g/dL 6.5 - -  Total Bilirubin 0.0 - 1.2 mg/dL <0.2 - -  Alkaline Phos 44 - 121 IU/L 134(H) - -  AST 0 - 40 IU/L 17 - -  ALT 0 - 32 IU/L 19 - -   Lipid Panel     Component Value Date/Time   CHOL 120 08/21/2021 1522   TRIG 91 08/21/2021 1522   HDL 53 08/21/2021 1522   CHOLHDL 2.3 08/21/2021 1522   CHOLHDL 2.6 01/28/2021 0523   VLDL 30 01/28/2021 0523   LDLCALC 50 08/21/2021 1522    CBC    Component Value Date/Time   WBC 8.2 05/11/2021 1957   RBC 4.60 05/11/2021 1957   HGB 12.7 05/11/2021 1957   HGB 13.3 08/30/2020 1141   HCT 41.8 05/11/2021 1957   HCT 41.9 08/30/2020 1141   PLT 198 05/11/2021 1957   PLT 128 (L) 08/30/2020 1141   MCV 90.9 05/11/2021 1957   MCV 88 08/30/2020 1141   MCH 27.6 05/11/2021 1957  MCHC 30.4 05/11/2021 1957   RDW 14.2 05/11/2021 1957   RDW 15.2 08/30/2020 1141   LYMPHSABS 3.4 05/11/2021 1957   LYMPHSABS 4.3 (H) 08/23/2017 1655   MONOABS 0.5 05/11/2021 1957   EOSABS 0.3 05/11/2021 1957   EOSABS 0.4 08/23/2017 1655   BASOSABS 0.1 05/11/2021 1957   BASOSABS 0.1 08/23/2017 1655    ASSESSMENT AND PLAN: 1. Type 2 diabetes mellitus with morbid obesity (HCC) Most recent A1c not at goal. Reported home blood sugar readings are good but patient does not have that log book with her today or her glucometer to verify. Recommend that she takes the insulin as prescribed and if she finds that she is needing more than prescribed she needs to let me know as pharmacy will no longer fill her insulin early. Patient advised to eliminate sugary drinks from the diet, cut back on portion sizes especially of white carbohydrates, eat more white lean meat like chicken [place] and seafood instead of beef or pork and incorporate fresh fruits and vegetables into the diet daily. -Recommend referral to endocrinology to assist with  management.  Patient is agreeable. -Keep upcoming appointment with GI doctor. - Ambulatory referral to Endocrinology  2. Essential hypertension At goal.  Discussed changing atenolol to Bystolic given that she has asthma.  Patient declines stating that she does not want to make any medication changes.  Continue medications listed above and low-salt diet.  3. Chronic diastolic congestive heart failure ([place]) Compensated.  Discussed the importance of good blood pressure control.  Continue furosemide.  4. Chronic respiratory failure with hypoxia, on home oxygen therapy ([place]) 5. Moderate persistent asthma without complication Continue home O2.  Recommend that we schedule a follow-up appointment with her pulmonologist Dr. Shearon Stalls - Ambulatory referral to Pulmonology  6. Ingrown toenail of both feet No signs of infection at this time.  However toenails are little overgrown and she does have ingrown nail on both big toes. - Ambulatory referral to Podiatry  7. OSA (obstructive sleep apnea) Discussed the importance of treating OSA.  Untreated OSA can affect blood pressure, heart failure and asthma.  Patient still not interested in doing a repeat sleep study or going back on CPAP.  8. Influenza vaccination declined   9. Need for shingles vaccine  We will make a phpne visit appointment for her to complete form to request to have student loans dismissed.   Patient was given the opportunity to ask questions.  Patient verbalized understanding of the plan and was able to repeat key elements of the plan.   This documentation was completed using Radio producer.  Any transcriptional errors are unintentional.  No orders of the defined types were placed in this encounter.    Requested Prescriptions    No prescriptions requested or ordered in this encounter    No follow-ups on file.  Karle Plumber, MD, FACP

## 2021-09-04 NOTE — Telephone Encounter (Signed)
Pt has a virtual appt with provider on 3/21. PT will need to discuss with provider at visit  ?

## 2021-09-04 NOTE — Patient Instructions (Signed)
I have submitted the referrals for you to see your pulmonologist as was discussed today. ?I have referred you to the endocrinologist for your diabetes. ? ?You can drop off your form but make sure that you have completed your portion.  I will have them schedule you for telephone visit in 2 weeks for [place] to talk about the form.  Make sure that you drop off your form prior to that visit. ?

## 2021-09-04 NOTE — Telephone Encounter (Signed)
Copied from [place] [phone]. Topic: General - Other ?>> Sep 04, 2021 10:18 AM Yvette Rack wrote: ?Reason for CRM: Pt daughter asked that pt be reminded to get a doctor's note for companion dog so she can give to leasing office. ?

## 2021-09-05 ENCOUNTER — Other Ambulatory Visit: Payer: Self-pay

## 2021-09-08 ENCOUNTER — Telehealth: Payer: Self-pay | Admitting: Internal Medicine

## 2021-09-08 ENCOUNTER — Other Ambulatory Visit: Payer: Self-pay

## 2021-09-08 NOTE — Telephone Encounter (Signed)
Pts Heidi [name] (Daughter)  is calling to request a letter to be written to [facility] [employer] aptarments to state that the patient has an companion dog that is with her at all times. ?Please advise [phone] ? ?Can this be mailed to  ?[place] ?[place], [place]. [address] ? ?And can this be uploaded into MyChart ?

## 2021-09-08 NOTE — Telephone Encounter (Signed)
Returned pt daughter call and made aware that pt has a virtual appt on 3/21. Pt daughter doesn't have any questions or concerns  ?

## 2021-09-11 ENCOUNTER — Other Ambulatory Visit: Payer: Self-pay

## 2021-09-11 ENCOUNTER — Ambulatory Visit: Payer: Medicare Other | Admitting: Podiatry

## 2021-09-11 ENCOUNTER — Encounter: Payer: Self-pay | Admitting: Podiatry

## 2021-09-11 DIAGNOSIS — L03031 Cellulitis of right toe: Secondary | ICD-10-CM

## 2021-09-11 DIAGNOSIS — L600 Ingrowing nail: Secondary | ICD-10-CM

## 2021-09-11 NOTE — Patient Instructions (Signed)

## 2021-09-12 ENCOUNTER — Other Ambulatory Visit: Payer: Self-pay

## 2021-09-12 ENCOUNTER — Telehealth: Payer: Self-pay

## 2021-09-12 DIAGNOSIS — E1169 Type 2 diabetes mellitus with other specified complication: Secondary | ICD-10-CM

## 2021-09-12 MED ORDER — ATORVASTATIN CALCIUM 40 MG PO TABS: 40.0000 mg | ORAL_TABLET | Freq: Every day | ORAL | 0 refills | Status: DC

## 2021-09-12 NOTE — Telephone Encounter (Signed)
Patient is enrolled in the Medicare Part D [place] until 07/05/2022.  Patient received a 87 days supply of Humalox Mix 75/25 on 06/24/22.  Dose was changed and patient received a 29 days supply of medication on 08/28/21.  Daughter, [name], paid the $35 copay on 08/28/21 as the medication fill was too soon for free medication fill/delivery at that time.  Patient was advised that a refill of the free stock would be placed after 09/03/21.  [name] called on 09/08/21 concerned that the 4 vials that were recently picked up were not going to last the 29 days.  I advised Ms. Wynetta Emery that based on the prescribed directions the 4 vials would indeed last the 29 days as long as Ms. Gariepy is administering as prescribed.  Ms. Wynetta Emery was also told that if they requested early refills there would always be a $35 copay if there was not patient assistance stock available to her.  Patient assistance stock (free) is always dispensed when due, no early fills allowed.  I told Ms. Wynetta Emery that I would call her or Ms. Oelkers once their free stock is received from [place], but even then it might be wise to fill 1 month at a time to try and avoid early fills as this seems to be an issue.  A order was requested with [facility] 09/11/21 with the updated dosing-2-3 business days for processing then 3 business days for shipping is the usual timeframe. ?

## 2021-09-12 NOTE — Telephone Encounter (Signed)
Phone call placed to patient this morning after I had spoken with Cheryle Horsfall  CPhT in the pharmacy regarding the call that she received from patient's daughter on 09/08/2021 with concern that the 4 vials of insulin that were issued to patient on 08/28/2021 with not being enough to last 29 days. ?-Told patient of my reason for calling.  I wanted to find out why she or her daughter thinks that the number of vials of insulin that were issued on the 23rd of last month would not be enough to last when indeed it was a 29-day supply.  Daughter called just 12 days after her last refill.  Advised patient that she is not due for a refill as yet.  Patient tells me that she was not calling for an early refill, she was just calling "to get things done early and out of the way."  She tells me that she was initially told by the pharmacy that she will be getting her new supply in on 09/09/2021 and then date was changed to 09/19/2021.  Inform patient that I have spoken with the pharmacy tech Lamont Snowball whom she has been in contact with.  I request that she and her daughter refrain from calling the pharmacy until she is down to 4 days on her insulin supply.  There is no need for her to be calling regarding refills when she still had at least 16-17 days left on what she was previous given. I kindly request that both she and her daughter stop doing this.  She expressed understanding. ? ?Pt then requested RF on Lipitor be sent to [facility] and a 90 day supply be sent to [facility] order Fax [phone].  I confirmed the fax # with a repeat back to pt.  I located in the system that this is the fax # for [facility].  Rxn will be sent ?

## 2021-09-12 NOTE — Progress Notes (Signed)
Subjective:  ? ?Patient ID: Heidi [name], female   DOB: 71 y.o.   MRN: [id number]  ? ?HPI ?Patient presents stating that she has had some soreness in her toenails of the big toenails bilateral and on the right big toenail its been crusted and there was some previous drainage that is been local.  Its not draining currently but it is tender right over left foot.  Patient does have significant obesity does not smoke and is not active ? ? ?Review of Systems  ?All other systems reviewed and are negative. ? ? ?   ?Objective:  ?Physical Exam ?Vitals and nursing note reviewed.  ?Constitutional:   ?   Appearance: She is well-developed.  ?Pulmonary:  ?   Effort: Pulmonary effort is normal.  ?Musculoskeletal:     ?   General: Normal range of motion.  ?Skin: ?   General: Skin is warm.  ?Neurological:  ?   Mental Status: She is alert.  ?  ?Neurovascular status was found to be mildly diminished both pulses PT DP and vibratory and sharp dull.  It is localized and patient complains of pains in her leg but they do not appear to be related to her feet there is no erythema edema in her feet and her pain in her legs is vague and difficult for me to follow.  She appears to have other pains 2 and again obesity is complicating factor.  She does have incurvated hallux nail borders lateral right over left with some crusted tissue on the right 1 and history of possible infection  ? ?   ?Assessment:  ?Possibility that there is a low-grade paronychia infection local to the right hallux with chronic ingrown toenail of the hallux bilateral ? ?   ?Plan:  ?H&P reviewed conditions and that I do not see any indication of cellulitic event here and it appears to be strictly ingrown toenails with low-grade paronychia right.  Today I infiltrated the right hallux 60 mg like Marcaine mixture sterile prep was done and I remove the lateral border I cleaned out the border I did not note any obvious pus I flushed it and applied sterile dressing with  instructions on soaks.  I just went ahead and trimmed the left 1 and took the corner out and this relieved her discomfort and patient will be seen back as needed but I did encourage her to go to her primary care doctor for her leg pain ?   ? ? ?

## 2021-09-13 ENCOUNTER — Other Ambulatory Visit: Payer: Self-pay | Admitting: Internal Medicine

## 2021-09-13 ENCOUNTER — Other Ambulatory Visit: Payer: Self-pay | Admitting: Physician Assistant

## 2021-09-13 DIAGNOSIS — I10 Essential (primary) hypertension: Secondary | ICD-10-CM

## 2021-09-15 NOTE — Telephone Encounter (Signed)
Requested Prescriptions  ?Pending Prescriptions Disp Refills  ?? atenolol (TENORMIN) 25 MG tablet [Pharmacy Med Name: ATENOLOL '25MG'$  TABLETS] 90 tablet   ?  Sig: TAKE 1 TABLET(25 MG) BY MOUTH DAILY  ?  ? Cardiovascular: Beta Blockers 2 Passed - 09/13/2021  6:20 AM  ?  ?  Passed - Cr in normal range and within 360 days  ?  Creat  ?Date Value Ref Range Status  ?05/12/2016 1.14 (H) 0.50 - 0.99 mg/dL Final  ?  Comment:  ?    ?For patients > or = 70 years of age: The upper reference limit for ?Creatinine is approximately 13% higher for people identified as ?African-American. ?  ?  ? ?Creatinine, Ser  ?Date Value Ref Range Status  ?08/21/2021 0.97 0.57 - 1.00 mg/dL Final  ? ?Creatinine, Urine  ?Date Value Ref Range Status  ?11/27/2019 146.00 mg/dL Final  ?  Comment:  ?  Performed at [facility], [place] [address] [place], [place] [address]  ?   ?  ?  Passed - Last BP in normal range  ?  BP Readings from Last 1 Encounters:  ?09/04/21 112/76  ?   ?  ?  Passed - Last Heart Rate in normal range  ?  Pulse Readings from Last 1 Encounters:  ?09/04/21 93  ?   ?  ?  Passed - Valid encounter within last 6 months  ?  Recent Outpatient Visits   ?      ? 1 week ago Type 2 diabetes mellitus with morbid obesity ([place])  ? [place] Karle Plumber B, MD  ? 3 weeks ago Uncontrolled type 2 diabetes mellitus with hyperglycemia ([place])  ? [place], Vernia Buff, NP  ? 2 months ago No-show for appointment  ? [place] Karle Plumber B, MD  ? 6 months ago Syncope, unspecified syncope type  ? [place] Ladell Pier, MD  ? 1 year ago Hospital discharge follow-up  ? [place] Ladell Pier, MD  ?  ?  ?Future Appointments   ?        ? In 1 month Ladell Pier, MD [place]  ?  ? ?  ?  ?  ? ? ?

## 2021-09-15 NOTE — Telephone Encounter (Signed)
Requested Prescriptions  ?Pending Prescriptions Disp Refills  ?? losartan (COZAAR) 50 MG tablet [Pharmacy Med Name: LOSARTAN '50MG'$  TABLETS] 90 tablet 0  ?  Sig: TAKE 1 TABLET(50 MG) BY MOUTH DAILY  ?  ? Cardiovascular:  Angiotensin Receptor Blockers Passed - 09/13/2021  6:20 AM  ?  ?  Passed - Cr in normal range and within 180 days  ?  Creat  ?Date Value Ref Range Status  ?05/12/2016 1.14 (H) 0.50 - 0.99 mg/dL Final  ?  Comment:  ?    ?For patients > or = 70 years of age: The upper reference limit for ?Creatinine is approximately 13% higher for people identified as ?African-American. ?  ?  ? ?Creatinine, Ser  ?Date Value Ref Range Status  ?08/21/2021 0.97 0.57 - 1.00 mg/dL Final  ? ?Creatinine, Urine  ?Date Value Ref Range Status  ?11/27/2019 146.00 mg/dL Final  ?  Comment:  ?  Performed at [facility], [place] [address] [place], [place] [address]  ?   ?  ?  Passed - K in normal range and within 180 days  ?  Potassium  ?Date Value Ref Range Status  ?08/21/2021 4.2 3.5 - 5.2 mmol/L Final  ?   ?  ?  Passed - Patient is not pregnant  ?  ?  Passed - Last BP in normal range  ?  BP Readings from Last 1 Encounters:  ?09/04/21 112/76  ?   ?  ?  Passed - Valid encounter within last 6 months  ?  Recent Outpatient Visits   ?      ? 1 week ago Type 2 diabetes mellitus with morbid obesity ([place])  ? [place] Karle Plumber B, MD  ? 3 weeks ago Uncontrolled type 2 diabetes mellitus with hyperglycemia ([place])  ? [place], Vernia Buff, NP  ? 2 months ago No-show for appointment  ? [place] Karle Plumber B, MD  ? 6 months ago Syncope, unspecified syncope type  ? [place] Ladell Pier, MD  ? 1 year ago Hospital discharge follow-up  ? [place] Ladell Pier, MD  ?  ?  ?Future Appointments   ?        ? In 1 month Ladell Pier, MD [place]  ?  ? ?  ?  ?  ? ? ?

## 2021-09-18 ENCOUNTER — Other Ambulatory Visit: Payer: Self-pay

## 2021-09-19 ENCOUNTER — Ambulatory Visit: Payer: Medicare Other | Admitting: Podiatry

## 2021-09-22 ENCOUNTER — Telehealth: Payer: Self-pay

## 2021-09-22 ENCOUNTER — Other Ambulatory Visit: Payer: Self-pay

## 2021-09-22 DIAGNOSIS — J45909 Unspecified asthma, uncomplicated: Secondary | ICD-10-CM | POA: Diagnosis not present

## 2021-09-22 DIAGNOSIS — I10 Essential (primary) hypertension: Secondary | ICD-10-CM | POA: Diagnosis not present

## 2021-09-22 NOTE — Telephone Encounter (Signed)
Humulin from [employer] patient assistance program was supposed to have been delivered Friday March 17.  We have not received a delivery.  I have been on hold with the company on and off today for a total of 6 hours and no response.  I will attempt to contact them again tomorrow 09/23/21.  If medication is not received before patient's need, then there will be a copay.  Spoke to patient on the phone today and she is aware.  I also told her I would call her back after I spoke to [place] regarding her order. ?

## 2021-09-23 ENCOUNTER — Other Ambulatory Visit: Payer: Self-pay

## 2021-09-23 ENCOUNTER — Ambulatory Visit: Payer: Medicare Other | Attending: Internal Medicine | Admitting: Internal Medicine

## 2021-09-23 DIAGNOSIS — Z91199 Patient's noncompliance with other medical treatment and regimen due to unspecified reason: Secondary | ICD-10-CM

## 2021-09-23 NOTE — Telephone Encounter (Signed)
Per [place], shipment was not shipped until yesterday (09/22/21).  Per UPS tracking, meds should be delivered this morning. UPS tracking #: [id number] ?

## 2021-09-23 NOTE — Progress Notes (Signed)
Patient no showed for this telephone visit today. ?

## 2021-09-23 NOTE — Telephone Encounter (Signed)
Medication received this morning.  Patient was advised and understands that we will only be releasing 4 vials as a 29 day supply to her at a time to avoid early fills as patient does not like having to pay her $35 copay. ?

## 2021-09-25 ENCOUNTER — Other Ambulatory Visit: Payer: Self-pay

## 2021-09-25 ENCOUNTER — Other Ambulatory Visit: Payer: Self-pay | Admitting: Pharmacist

## 2021-09-25 ENCOUNTER — Telehealth: Payer: Self-pay | Admitting: Internal Medicine

## 2021-09-25 MED ORDER — HUMALOG MIX 75/25 (75-25) 100 UNIT/ML ~~LOC~~ SUSP: SUBCUTANEOUS | 2 refills | Status: DC

## 2021-09-25 NOTE — Telephone Encounter (Signed)
Copied from [place] [id number]. Topic: Appointment Scheduling - Scheduling Inquiry for Clinic >> Sep 23, 2021  2:46 PM Tessa Lerner A wrote: Reason for CRM: The patient has returned the missed call from the practice for their telephone appt   The patient would also like to address concerns related to their prescriptions and their preferred pharmacy   Please contact further

## 2021-09-30 ENCOUNTER — Ambulatory Visit: Payer: Medicare Other | Admitting: Neurology

## 2021-09-30 DIAGNOSIS — G43709 Chronic migraine without aura, not intractable, without status migrainosus: Secondary | ICD-10-CM | POA: Diagnosis not present

## 2021-09-30 NOTE — Progress Notes (Signed)
Botox consent signed ? ?Botox- 200 units x 1 vial ?Lot: [id number] ?Expiration: 09/025 ?[place]: [phone] ? ?Bacteriostatic 0.9% Sodium Chloride- 65m total ?Lot: [id number]?Expiration: 02/04/2023 ?NFoster [id number]? ?Dx: GS41.991?B/B ? ?

## 2021-09-30 NOTE — Progress Notes (Signed)
09/30/2021: stable ?Interval history 07/08/2020 STABLE: Patient with chronic intractable migraines. Still doing exceptionally well on Botox for Migraines, only botox and topiramate have ever helped. Prior tried multiple classes of medications which did not help with headaches or migraines until she started Botox. Since starting botox she has had  >95% improvement in frequency and severity, had to redo one botox and she did well again. The migraines she does have are sparse, and only recur when the botox is wearing off the last 2 weeks but still are easier to treat and >60% less severe.Nothing has worked in the past except botox and Topiramate.  ?No masseters or any other additional injections ? ?Consent Form ?Botulism Toxin Injection For Chronic Migraine ? ? ? ?Reviewed orally with patient, additionally signature is on file: ? ?Botulism toxin has been approved by the Federal drug administration for treatment of chronic migraine. Botulism toxin does not cure chronic migraine and it may not be effective in some patients. ? ?The administration of botulism toxin is accomplished by injecting a small amount of toxin into the muscles of the neck and head. Dosage must be titrated for each individual. Any benefits resulting from botulism toxin tend to wear off after 3 months with a repeat injection required if benefit is to be maintained. Injections are usually done every 3-4 months with maximum effect peak achieved by about 2 or 3 weeks. Botulism toxin is expensive and you should be sure of what costs you will incur resulting from the injection. ? ?The side effects of botulism toxin use for chronic migraine may include: ? ? -Transient, and usually mild, facial weakness with facial injections ? -Transient, and usually mild, head or neck weakness with head/neck injections ? -Reduction or loss of forehead facial animation due to forehead muscle weakness ? -Eyelid drooping ? -Dry eye ? -Pain at the site of injection or bruising at  the site of injection ? -Double vision ? -Potential unknown long term risks ? ?Contraindications: You should not have Botox if you are pregnant, nursing, allergic to albumin, have an infection, skin condition, or muscle weakness at the site of the injection, or have myasthenia gravis, Lambert-Eaton syndrome, or ALS. ? ?It is also possible that as with any injection, there may be an allergic reaction or no effect from the medication. Reduced effectiveness after repeated injections is sometimes seen and rarely infection at the injection site may occur. All care will be taken to prevent these side effects. If therapy is given over a long time, atrophy and wasting in the muscle injected may occur. Occasionally the patient's become refractory to treatment because they develop antibodies to the toxin. In this event, therapy needs to be modified. ? ?I have read the above information and consent to the administration of botulism toxin. ? ? ? ?BOTOX PROCEDURE NOTE FOR MIGRAINE HEADACHE ? ? ? ?Contraindications and precautions discussed with patient(above). Aseptic procedure was observed and patient tolerated procedure. Procedure performed by Dr. Georgia Dom ? ?The condition has existed for more than 6 months, and pt does not have a diagnosis of ALS, Myasthenia Gravis or Lambert-Eaton Syndrome.  Risks and benefits of injections discussed and pt agrees to proceed with the procedure.  Written consent obtained ? ?These injections are medically necessary. Pt  receives good benefits from these injections. These injections do not cause sedations or hallucinations which the oral therapies may cause. ? ?Description of procedure: ? ?The patient was placed in a sitting position. The standard protocol was used for Botox  as follows, with 5 units of Botox injected at each site: ? ? ?-Procerus muscle, midline injection ? ?-Corrugator muscle, bilateral injection ? ?-Frontalis muscle, bilateral injection, with 2 sites each side, medial  injection was performed in the upper one third of the frontalis muscle, in the region vertical from the medial inferior edge of the superior orbital rim. The lateral injection was again in the upper one third of the forehead vertically above the lateral limbus of the cornea, 1.5 cm lateral to the medial injection site. ? ?-Temporalis muscle injection, 4 sites, bilaterally. The first injection was 3 cm above the tragus of the ear, second injection site was 1.5 cm to 3 cm up from the first injection site in line with the tragus of the ear. The third injection site was 1.5-3 cm forward between the first 2 injection sites. The fourth injection site was 1.5 cm posterior to the second injection site.  ? ?-Occipitalis muscle injection, 3 sites, bilaterally. The first injection was done one half way between the occipital protuberance and the tip of the mastoid process behind the ear. The second injection site was done lateral and superior to the first, 1 fingerbreadth from the first injection. The third injection site was 1 fingerbreadth superiorly and medially from the first injection site. ? ?-Cervical paraspinal muscle injection, 2 sites, bilateral knee first injection site was 1 cm from the midline of the cervical spine, 3 cm inferior to the lower border of the occipital protuberance. The second injection site was 1.5 cm superiorly and laterally to the first injection site. ? ?-Trapezius muscle injection was performed at 3 sites, bilaterally. The first injection site was in the upper trapezius muscle halfway between the inflection point of the neck, and the acromion. The second injection site was one half way between the acromion and the first injection site. The third injection was done between the first injection site and the inflection point of the neck. ? ? ?Will return for repeat injection in 3 months. ? ? ?155 units of Botox was used, 45U Botox not injected was wasted. The patient tolerated the procedure well,  there were no complications of the above procedure. ? ? ? ?

## 2021-10-06 ENCOUNTER — Other Ambulatory Visit: Payer: Self-pay

## 2021-10-09 ENCOUNTER — Encounter: Payer: Self-pay | Admitting: Pulmonary Disease

## 2021-10-09 ENCOUNTER — Other Ambulatory Visit: Payer: Self-pay | Admitting: Internal Medicine

## 2021-10-09 ENCOUNTER — Ambulatory Visit (INDEPENDENT_AMBULATORY_CARE_PROVIDER_SITE_OTHER): Payer: Medicare Other | Admitting: Pulmonary Disease

## 2021-10-09 ENCOUNTER — Telehealth: Payer: Self-pay | Admitting: Internal Medicine

## 2021-10-09 ENCOUNTER — Ambulatory Visit (INDEPENDENT_AMBULATORY_CARE_PROVIDER_SITE_OTHER): Payer: Medicare Other

## 2021-10-09 ENCOUNTER — Other Ambulatory Visit: Payer: Self-pay

## 2021-10-09 VITALS — BP 130/70 | HR 76 | Ht 64.0 in | Wt 241.2 lb

## 2021-10-09 DIAGNOSIS — J45909 Unspecified asthma, uncomplicated: Secondary | ICD-10-CM

## 2021-10-09 DIAGNOSIS — I5032 Chronic diastolic (congestive) heart failure: Secondary | ICD-10-CM

## 2021-10-09 DIAGNOSIS — R059 Cough, unspecified: Secondary | ICD-10-CM

## 2021-10-09 MED ORDER — FLUTICASONE-SALMETEROL 100-50 MCG/ACT IN AEPB: 1.0000 | INHALATION_SPRAY | Freq: Two times a day (BID) | RESPIRATORY_TRACT | 5 refills | Status: DC

## 2021-10-09 NOTE — Progress Notes (Signed)
? ?      ?Heidi [name]    [id number]    [birth year] ? ?Primary Care Physician:Johnson, Dalbert Batman, MD ?Date of Appointment: 10/09/2021 ?Established Patient Visit ? ?Chief complaint:   ?Chief Complaint  ?Patient presents with  ? Consult  ?  [place]   ? ? ? ?HPI: ?Heidi [name] is a 71 y.o. woman never smoker with chronic hypercapnia and hypoxemia on BIPAP, asthma on advair, and covid 19 infection Feb 2022 with severe parenchymal lung injury on [facility] continuously. Start on oxygen August 2021.  ? ?Interval Updates: ?Former patient of Dr. Melvyn Novas, established care with Dr. Shearon Stalls in 10/2020 ?Ran out of Advair.  Albuterol twice a day. Uses Symbicort every other day. Symptoms are triggered by pollen. ?Wears oxygen with exertion. Does not need it at rest.  ?Sleep study has been ordered but hasn't been scheduled yet.  ?She was seen in the ED on 05/2021 and treated for asthma exacerbation with doxycycline ? ?DME - [facility] ? ?Current Regimen: Symbicort PRN, SABA ?Asthma Triggers: exercise, cold weather ?Exacerbations in the last year: 3, related to covid and URI.  ?History of hospitalization or intubation: yes, not intubation ?Allergy Testing: none ?GERD: denies ?Allergic Rhinitis: denies ?ACT:  ?Asthma Control Test ACT Total Score  ?10/09/2021 ?11:27 AM 15  ?10/21/2020 ?11:16 AM 17  ?09/13/2020 ?12:12 PM 14  ? ?FeNO: 8 ppb ? ? ?I have reviewed the patient's family social and past medical history and updated as appropriate.  ? ?Past Medical History:  ?Diagnosis Date  ? Adenomatous colon polyp   ? Anemia   ? Anxiety   ? Asthma   ? CHF (congestive heart failure) ([place])   ? COPD (chronic obstructive pulmonary disease) ([place])   ? Diabetes mellitus without complication ([place])   ? High cholesterol   ? Hypertension   ? Left knee injury   ? Pneumonia   ? Sleep apnea   ? Supplemental oxygen dependent   ? 2L [place]  ? ? ?Past Surgical History:  ?Procedure Laterality Date  ? ABDOMINAL HYSTERECTOMY    ? CATARACT EXTRACTION Left   ? CATARACT  EXTRACTION    ? LT 01/2017, 02/2017 on RT  ? CESAREAN SECTION    ? COLONOSCOPY WITH PROPOFOL N/A 04/15/2021  ? Procedure: COLONOSCOPY WITH PROPOFOL;  Surgeon: Irene Shipper, MD;  Location: [facility] ENDOSCOPY;  Service: Endoscopy;  Laterality: N/A;  patient is on O2  ? JOINT REPLACEMENT Left   ? Plate in Ankle  ? POLYPECTOMY  04/15/2021  ? Procedure: POLYPECTOMY;  Surgeon: Irene Shipper, MD;  Location: [facility];  Service: Endoscopy;;  ? ? ?Family History  ?Problem Relation Age of Onset  ? Colon cancer Father   ? Diabetes type II Sister   ? Diabetes type II Brother   ? Colon cancer Sister   ? Migraines Neg Hx   ? Breast cancer Neg Hx   ? ? ?Social History  ? ?Occupational History  ? Occupation: Disabled  ?Tobacco Use  ? Smoking status: Never  ?  Passive exposure: Never  ? Smokeless tobacco: Never  ?Vaping Use  ? Vaping Use: Never used  ?Substance and Sexual Activity  ? Alcohol use: No  ? Drug use: No  ? Sexual activity: Yes  ? ? ? ?Physical Exam: ?Blood pressure 130/70, pulse 76, height '5\' 4"'$  (1.626 m), weight 241 lb 3.2 oz (109.4 kg), SpO2 94 %. ? ?Physical Exam: ?General: Well-appearing, no acute distress ?HENT: [place], AT ?Eyes: EOMI, no  scleral icterus ?Respiratory: Clear to auscultation bilaterally.  No crackles, wheezing or rales ?Cardiovascular: RRR, -M/R/G, no JVD ?Extremities:-Edema,-tenderness ?Neuro: AAO x4, CNII-XII grossly intact ?Psych: Normal mood, normal affect ? ? ?Data Reviewed: ?Imaging: ?CXR 05/11/21 Bibasilar atelectasis. No infiltrate, effusion or edema ? ?PFTs: ? ? ? ?  Latest Ref Rng & Units 10/27/2017  ? 11:49 AM  ?PFT Results  ?FVC-Pre L 1.24    ?FVC-Predicted Pre % 49    ?FVC-Post L 1.14    ?FVC-Predicted Post % 45    ?Pre FEV1/FVC % % 86    ?Post FEV1/FCV % % 87    ?FEV1-Pre L 1.07    ?FEV1-Predicted Pre % 54    ?FEV1-Post L 1.00    ?DLCO uncorrected ml/min/mmHg 9.77    ?DLCO UNC% % 40    ?DLVA Predicted % 116    ?TLC L 3.04    ?TLC % Predicted % 60    ?RV % Predicted % 62    ? ?I have personally  reviewed the patient's PFTs and they show mild restriction to ventilation with reduced diffusion capacity.  ? ?Labs: ?covid 19 positive jan 2022 ? ?Immunization status: ?Immunization History  ?Administered Date(s) Administered  ? Influenza Whole 05/11/2014  ? Influenza, High Dose Seasonal PF 04/08/2018, 02/25/2019  ? Influenza,inj,Quad PF,6+ Mos 03/26/2015, 05/12/2016, 04/18/2017  ? Pneumococcal Conjugate-13 01/14/2015  ? Pneumococcal Polysaccharide-23 12/19/2013, 11/13/2019  ? Tdap 11/20/2016  ? ? ?Assessment:  ?Moderate Persistent Asthma, non TH2 mediated ?Chronic Hypoxemic Respiratory Failure ?COVID 19 infection Feb 2022, unvaccinated ?Suspected OHS/OSA ?Chronic HFpEF ? ? ?Plan/Recommendations: ?Refilled Advair Diskus ONE puff TWICE a day ?Continue albuterol as needed ?ORDER pulmonary function test in June ?ORDER CXR. Patient concerned about atelectasis ?Sleep study pending. She mentioned she has a lot of competing priorities right now in her health. Will continue to revisit.  ? ?I have spent a total time of 30-minutes on the day of the appointment reviewing prior documentation, coordinating care and discussing medical diagnosis and plan with the patient/family. Past medical history, allergies, medications were reviewed. Pertinent imaging, labs and tests included in this note have been reviewed and interpreted independently by me. ? ? ?Return to Care: ?Follow-up with Dr. Shearon Stalls in June with PFTs prior to visit ?No follow-ups on file. ? ? ?Rodman Pickle, MD ?Pulmonary and Critical Care Medicine ?[place] [place] ?[phone] ? ? ? ? ? ?

## 2021-10-09 NOTE — Telephone Encounter (Signed)
Copied from [place] [id number]. Topic: General - Other ?>> Oct 09, 2021  5:02 PM Tessa Lerner A wrote: ?Reason for CRM: The patient has made contact for an update on their previously requested prescription for loratadine (CLARITIN) 10 MG tablet [[id number]] and furosemide (LASIX) 40 MG tablet [[id number]]  ? ?Please contact further when possible ? ?The patient will be traveling on 10/14/21 and may be leaving even sooner than that if the medication is ready for pick up ?

## 2021-10-09 NOTE — Telephone Encounter (Signed)
Requested medication (s) are due for refill today: Yes ? ?Requested medication (s) are on the active medication list: Yes ? ?Last refill:  06/22/21 ? ?Future visit scheduled: Yes ? ?Notes to clinic:  Unable to refill per protocol due to patient needing to be seen for OV. ? ? ?  ?Requested Prescriptions  ?Pending Prescriptions Disp Refills  ? furosemide (LASIX) 40 MG tablet 30 tablet 0  ?  ? Cardiovascular:  Diuretics - Loop Failed - 10/09/2021  9:35 AM  ?  ?  Failed - Cl in normal range and within 180 days  ?  Chloride  ?Date Value Ref Range Status  ?08/21/2021 107 (H) 96 - 106 mmol/L Final  ?  ?  ?  ?  Failed - Mg Level in normal range and within 180 days  ?  Magnesium  ?Date Value Ref Range Status  ?08/16/2020 2.2 1.7 - 2.4 mg/dL Final  ?  Comment:  ?  Performed at [facility] [address] [place], [place] [address]  ?  ?  ?  ?  Passed - K in normal range and within 180 days  ?  Potassium  ?Date Value Ref Range Status  ?08/21/2021 4.2 3.5 - 5.2 mmol/L Final  ?  ?  ?  ?  Passed - Ca in normal range and within 180 days  ?  Calcium  ?Date Value Ref Range Status  ?08/21/2021 9.6 8.7 - 10.3 mg/dL Final  ? ?Calcium, Ion  ?Date Value Ref Range Status  ?01/27/2021 1.24 1.15 - 1.40 mmol/L Final  ?  ?  ?  ?  Passed - Na in normal range and within 180 days  ?  Sodium  ?Date Value Ref Range Status  ?08/21/2021 142 134 - 144 mmol/L Final  ?  ?  ?  ?  Passed - Cr in normal range and within 180 days  ?  Creat  ?Date Value Ref Range Status  ?05/12/2016 1.14 (H) 0.50 - 0.99 mg/dL Final  ?  Comment:  ?    ?For patients > or = 70 years of age: The upper reference limit for ?Creatinine is approximately 13% higher for people identified as ?African-American. ?  ?  ? ?Creatinine, Ser  ?Date Value Ref Range Status  ?08/21/2021 0.97 0.57 - 1.00 mg/dL Final  ? ?Creatinine, Urine  ?Date Value Ref Range Status  ?11/27/2019 146.00 mg/dL Final  ?  Comment:  ?  Performed at [facility], [place] [address], [place], [place] [address]   ?  ?  ?  ?  Passed - Last BP in normal range  ?  BP Readings from Last 1 Encounters:  ?10/09/21 130/70  ?  ?  ?  ?  Passed - Valid encounter within last 6 months  ?  Recent Outpatient Visits   ? ?      ? 2 weeks ago No-show for appointment  ? [place] Ladell Pier, MD  ? 1 month ago Type 2 diabetes mellitus with morbid obesity ([place])  ? [place] Ladell Pier, MD  ? 1 month ago Uncontrolled type 2 diabetes mellitus with hyperglycemia ([place])  ? [place], Vernia Buff, NP  ? 2 months ago No-show for appointment  ? [place] Karle Plumber B, MD  ? 7 months ago Syncope, unspecified syncope type  ? [place]  Ladell Pier, MD  ? ?  ?  ?Future Appointments   ? ?        ? In 4 weeks Ladell Pier, MD [place]  ? In 2 months Spero Geralds, MD [facility]  ? ?  ? ?  ?  ?  ? loratadine (CLARITIN) 10 MG tablet 30 tablet 3  ?  Sig: Take 1 tablet (10 mg total) by mouth daily as needed for allergies.  ?  ? Ear, Nose, and Throat:  Antihistamines 2 Passed - 10/09/2021  9:35 AM  ?  ?  Passed - Cr in normal range and within 360 days  ?  Creat  ?Date Value Ref Range Status  ?05/12/2016 1.14 (H) 0.50 - 0.99 mg/dL Final  ?  Comment:  ?    ?For patients > or = 70 years of age: The upper reference limit for ?Creatinine is approximately 13% higher for people identified as ?African-American. ?  ?  ? ?Creatinine, Ser  ?Date Value Ref Range Status  ?08/21/2021 0.97 0.57 - 1.00 mg/dL Final  ? ?Creatinine, Urine  ?Date Value Ref Range Status  ?11/27/2019 146.00 mg/dL Final  ?  Comment:  ?  Performed at [facility], [place] [address] [place], [place] [address]  ?  ?  ?  ?  Passed - Valid encounter within last 12 months  ?  Recent Outpatient Visits   ? ?      ? 2 weeks ago No-show for appointment  ? [place] Ladell Pier, MD  ? 1 month ago Type 2 diabetes mellitus with morbid obesity ([place])  ? [place] Ladell Pier, MD  ? 1 month ago Uncontrolled type 2 diabetes mellitus with hyperglycemia ([place])  ? [place], Vernia Buff, NP  ? 2 months ago No-show for appointment  ? [place] Karle Plumber B, MD  ? 7 months ago Syncope, unspecified syncope type  ? [place] Ladell Pier, MD  ? ?  ?  ?Future Appointments   ? ?        ? In 4 weeks Ladell Pier, MD [place]  ? In 2 months Spero Geralds, MD [facility]  ? ?  ? ?  ?  ?  ? ? ?

## 2021-10-09 NOTE — Telephone Encounter (Signed)
Routed request on 10/09/21 in another encounter to the provider for approval. ?

## 2021-10-09 NOTE — Telephone Encounter (Signed)
Copied from [place] [id number]. Topic: Quick Communication - Rx Refill/Question ?>> Oct 09, 2021  8:12 AM [place] Quan A wrote: ?Medication: furosemide (LASIX) 40 MG tablet, loratadine (CLARITIN) 10 MG tablet ?Sick brother in [place] leaving in a few days  ? ?Has the patient contacted their pharmacy? No. Was waiting on it from mail order but no current Rx  ?(Agent: If no, request that the patient contact the pharmacy for the refill. If patient does not wish to contact the pharmacy document the reason why and proceed with request.) ?(Agent: If yes, when and what did the pharmacy advise?) ? ?Preferred Pharmacy (with phone number or street name): [id number] - [place], [place]  ?Phone:  [phone] ?Fax:  [phone] ? ? ? ?Has the patient been seen for an appointment in the last year OR does the patient have an upcoming appointment? Yes.   ? ?Agent: Please be advised that RX refills may take up to 3 business days. We ask that you follow-up with your pharmacy. ?

## 2021-10-09 NOTE — Patient Instructions (Signed)
Refilled Advair Diskus ONE puff TWICE a day ?Continue albuterol as needed ? ?ORDER pulmonary function test in June ?ORDER CXR ? ?Follow-up with Dr. Shearon Stalls in June with PFTs prior to visit ?

## 2021-10-10 ENCOUNTER — Other Ambulatory Visit: Payer: Self-pay

## 2021-10-10 ENCOUNTER — Telehealth: Payer: Self-pay

## 2021-10-10 ENCOUNTER — Other Ambulatory Visit: Payer: Self-pay | Admitting: Internal Medicine

## 2021-10-10 DIAGNOSIS — I5032 Chronic diastolic (congestive) heart failure: Secondary | ICD-10-CM

## 2021-10-10 NOTE — Telephone Encounter (Signed)
Patient called to request early fill (by 2 weeks) for her insulin, stating that her brother is ill and she has to go visit him.  I told patient that she can come and get her 9 remaining vials and she will be give [place] contact information as I will no longer be assisting her with her patient assistance.  Also advised that when her enrollment expires she will need to re-enroll herself.  Patient was instructed to call [facility] for refills to be mailed directly to her home address which was verified with patient; [id number].  Told patient that she will have to deal directly with [facility] as [place] [facility] will no longer be assisting her with patient assistance/insulin fills.  Patient asked why we were no longer assisting patient and I told her that per our last conversation there was an acknowledged understanding on the patients part that the multiple calls and continuous early refill requests(months early) were to end.  Pt stated that she only requested early one time, but was not going to argue.  06/16/21 she requested a refill 1 month early.  2 months early requested 09/03/21.  After those 2 early fill issues patient was aware that we were only going to fill 1 month at a time to prevent her from running out of meds as she seemed to be having an issue managing therapy.  She will call almost every week to 2 weeks.  I told patient that I really did not have the time to devote to managing her stock/enrollment when I am having to have the same conversations with her about twice a month since 03/2021. ?

## 2021-10-12 MED ORDER — LORATADINE 10 MG PO TABS: 10.0000 mg | ORAL_TABLET | Freq: Every day | ORAL | 0 refills | Status: DC | PRN

## 2021-10-12 MED ORDER — FUROSEMIDE 40 MG PO TABS: ORAL_TABLET | ORAL | 0 refills | Status: DC

## 2021-10-13 ENCOUNTER — Other Ambulatory Visit: Payer: Self-pay

## 2021-10-13 ENCOUNTER — Telehealth: Payer: Self-pay | Admitting: Internal Medicine

## 2021-10-13 NOTE — Telephone Encounter (Signed)
Terry from [place] called to report that she is faxing a document for PCP to sign that is for the patient's annual dispensing for oxygen in a different state. She is traveling tomorrow.  ?Best contact: [phone] ? ?

## 2021-10-15 NOTE — Telephone Encounter (Signed)
Returned Call to [place] and spoke to [place] and made her aware that we haven't received paperwork but they can fax it to the number I provided and if they have any questions or concerns to give [place] a call  ?

## 2021-10-23 DIAGNOSIS — J45909 Unspecified asthma, uncomplicated: Secondary | ICD-10-CM | POA: Diagnosis not present

## 2021-10-23 DIAGNOSIS — I10 Essential (primary) hypertension: Secondary | ICD-10-CM | POA: Diagnosis not present

## 2021-11-06 ENCOUNTER — Telehealth: Payer: Self-pay | Admitting: Internal Medicine

## 2021-11-06 NOTE — Telephone Encounter (Signed)
Copied from [place]. Topic: General - Other ?>> Nov 06, 2021 12:10 PM McGill, Nelva Bush wrote: ?Reason for CRM: Pt daughter stated pt needs note from PCP for service dog Donley Redder).  Pt daughter stated she needs as soon as possible and can pick up. ?

## 2021-11-07 ENCOUNTER — Encounter: Payer: Self-pay | Admitting: Internal Medicine

## 2021-11-07 ENCOUNTER — Ambulatory Visit: Payer: Medicare Other | Attending: Internal Medicine | Admitting: Internal Medicine

## 2021-11-07 DIAGNOSIS — Z6841 Body Mass Index (BMI) 40.0 and over, adult: Secondary | ICD-10-CM

## 2021-11-07 DIAGNOSIS — I10 Essential (primary) hypertension: Secondary | ICD-10-CM

## 2021-11-07 DIAGNOSIS — E1169 Type 2 diabetes mellitus with other specified complication: Secondary | ICD-10-CM

## 2021-11-07 LAB — POCT GLYCOSYLATED HEMOGLOBIN (HGB A1C): HbA1c, POC (controlled diabetic range): 9.9 % — AB (ref 0.0–7.0)

## 2021-11-07 LAB — GLUCOSE, POCT (MANUAL RESULT ENTRY): POC Glucose: 120 mg/dl — AB (ref 70–99)

## 2021-11-07 MED ORDER — FUROSEMIDE 40 MG PO TABS: ORAL_TABLET | ORAL | 1 refills | Status: DC

## 2021-11-07 MED ORDER — FAMOTIDINE 20 MG PO TABS: 20.0000 mg | ORAL_TABLET | Freq: Every day | ORAL | 5 refills | Status: DC

## 2021-11-07 MED ORDER — LORATADINE 10 MG PO TABS
10.0000 mg | ORAL_TABLET | Freq: Every day | ORAL | 0 refills | Status: DC | PRN
Start: 2021-11-07 — End: 2023-08-19

## 2021-11-07 MED ORDER — NITROGLYCERIN 0.3 MG SL SUBL: 0.3000 mg | SUBLINGUAL_TABLET | SUBLINGUAL | 1 refills | Status: DC | PRN

## 2021-11-07 NOTE — Patient Instructions (Addendum)
Please call [facility] to schedule your appointment with the endocrinologist for your diabetes.  The phone number is [phone]. ? ?Please keep a log of your blood sugar readings.  Bring them with you when you return in 3 weeks to see our clinical pharmacist. ?

## 2021-11-07 NOTE — Telephone Encounter (Signed)
Pt had appointment today to discuss concerns. ?

## 2021-11-07 NOTE — Progress Notes (Signed)
? ? ?Patient ID: Heidi [name], female    DOB: March 10, 1951  MRN: [id number] ? ?CC: Diabetes ? ? ?Subjective: ?Heidi [name] is a 71 y.o. female who presents for DM.  Abiha, her daughter is with her. ?Her concerns today include:  ?history of HTN, DM 2 with retinopathy, HL, CKD 3, diastolic CHF, hypoxic respiratory failure/cough variant asthma 2 L O2 continuously, migraines ([facility] on Botox inj), OSA (declines CPAP or repeat sleep study), UACS, GERD, OA knees,  DDD/spondylosis of cervical spine,obesity, renal cyst, LT thyroid nodule. ? ?Pt's wanting letter for her to keep her dog.  She has a  Pug/Terrier missed.  Had dog since 2012. Wants letter so she can take dog with her when she travels so dog can stay with her at hotels etc. her daughter had called in requesting a letter stating that it is a service dog.  However patient reports that the dog has never been trained OR certified as a Neurosurgeon.  Dog is more of a companion for her. ? ?DM: ?Results for orders placed or performed in visit on 11/07/21  ?POCT A1C  ?Result Value Ref Range  ? Hemoglobin A1C    ? HbA1c POC (<> result, manual entry)    ? HbA1c, POC (prediabetic range)    ? HbA1c, POC (controlled diabetic range) 9.9 (A) 0.0 - 7.0 %  ?Glucose (CBG)  ?Result Value Ref Range  ? POC Glucose 120 (A) 70 - 99 mg/dl  ?Referred to Endocrinologist on last visit. Was set Dr. Loanne Drilling for 10/09/2021 but it was cancelled as he will no longer be there.  Was called 10/31/2021 by [facility] to schedule a new appointment but she does not recall receiving a message from them. ?-Checking BS 3-4 times a day before meals.  Does not have log book with her. Reports BS was in 200s a few mornings but other than that readings have been 109-160 ?-Reports taking Humalog 75/25 80 units a.m/40 units p.m.  Sometimes she switch it around to 40 units in a.m and 80 units p.m if a.m BS low.  Reports about 3 low BS episodes since she last saw me.  Lowest was 60.  She  felt it. Se keeps something with her to eat ?She states that she will now be getting her insulin through [facility] and will let me know when she needs me to send new prescription for her.   ?Feels she is geting better with eating habits ?Reports she is getting out a little bit more.  ? ?Blood pressure noted to be a little elevated today.  She reports that she has taken her medicines already for the morning. ? ?She requests refills on several medications including loratadine, famotidine, sublingual nitroglycerin. ? ?Patient Active Problem List  ? Diagnosis Date Noted  ? Moderate persistent asthma without complication 36/14/4315  ? History of colonic polyps   ? Benign neoplasm of ascending colon   ? Benign neoplasm of transverse colon   ? Benign neoplasm of descending colon   ? Postural dizziness with presyncope 02/10/2021  ? Acute encephalopathy 01/27/2021  ? Fall at home, initial encounter 01/27/2021  ? Pneumonia due to COVID-19 virus 08/10/2020  ? Encephalopathy due to COVID-19 virus 08/10/2020  ? Acute kidney injury superimposed on CKD ([place]) 08/10/2020  ? COVID 08/10/2020  ? Encounter for medication review and counseling 03/18/2020  ? Medication management 02/28/2020  ? Exertional dyspnea 01/03/2020  ? Breast nodule 11/24/2019  ? Chronic respiratory failure with hypoxia,  on home oxygen therapy ([place]) 11/13/2019  ? Atypical chest pain 08/24/2019  ? Diabetic retinopathy of right eye associated with type 2 diabetes mellitus ([place]) 08/16/2018  ? Upper airway cough syndrome 05/03/2018  ? Morbid obesity due to excess calories ([place]) complicated by hbp/ dm / hyperlipidemia 09/10/2017  ? Cough variant asthma vs UACS/vcd on ACEi  09/09/2017  ? Anxiety and depression 09/06/2017  ? Mild persistent asthma without complication 79/08/4095  ? Paranoia ([place]) 09/06/2017  ? Torn left ear lobe 11/20/2016  ? Chronic migraine w/o aura w/o status migrainosus, not intractable 06/27/2016  ? Post-menopausal atrophic vaginitis  11/14/2015  ? Primary osteoarthritis of both knees 07/18/2015  ? GERD (gastroesophageal reflux disease) 05/08/2015  ? Renal cyst 05/08/2015  ? Adenomatous polyp of colon 03/26/2015  ? Essential hypertension 02/12/2015  ? Cognitive deficit due to old head trauma 02/12/2015  ? Chronic left shoulder pain 02/11/2015  ? Thyroid nodule 12/18/2014  ? Chronic neck pain   ? HLD (hyperlipidemia)   ? OSA treated with BiPAP 11/27/2014  ? Obesity hypoventilation syndrome ([place]) 11/27/2014  ? Demand ischemia ([place])   ? Uncontrolled type 2 diabetes mellitus with hyperglycemia ([place])   ? Cardiomegaly 11/25/2014  ? CHF (congestive heart failure) ([place]) 11/25/2014  ? DM type 2 causing CKD stage 2 ([place]) 11/25/2014  ?  ? ?Current Outpatient Medications on File Prior to Visit  ?Medication Sig Dispense Refill  ? Accu-Chek FastClix Lancets MISC Use as directed to test blood sugar three times daily DX E11.65 102 each 6  ? acetaminophen (TYLENOL) 500 MG tablet Take 500-1,000 mg by mouth every 6 (six) hours as needed (pain).    ? albuterol (PROVENTIL) (2.5 MG/3ML) 0.083% nebulizer solution USE 1 VIAL VIA NEBULIZER EVERY 6 HOURS AS NEEDED FOR WHEEZING OR SHORTNESS OF BREATH 25 vial 0  ? albuterol (VENTOLIN HFA) 108 (90 Base) MCG/ACT inhaler Inhale 1-2 puffs into the lungs every 4 (four) hours as needed for wheezing or shortness of breath. 8 g 5  ? atenolol (TENORMIN) 25 MG tablet TAKE 1 TABLET(25 MG) BY MOUTH DAILY 90 tablet 0  ? atorvastatin (LIPITOR) 40 MG tablet Take 1 tablet (40 mg total) by mouth daily. 30 tablet 0  ? Blood Glucose Monitoring Suppl (ACCU-CHEK GUIDE ME) w/Device KIT Use as directed to test blood sugar three times daily DX E11.65 1 kit 0  ? fluticasone-salmeterol (ADVAIR DISKUS) 100-50 MCG/ACT AEPB Inhale 1 puff into the lungs 2 (two) times daily. 60 each 5  ? glucose blood (ACCU-CHEK GUIDE) test strip Use as directed to test blood sugar three times daily DX E11.65 100 each 6  ? insulin lispro protamine-lispro (HUMALOG MIX  75/25) (75-25) 100 UNIT/ML SUSP injection Inject 80 units in the morning and 45-55 units in the evening. 100 mL 2  ? INSULIN SYRINGE 1CC/29G 29G X 1/2" 1 ML MISC USE THREE TIMES DAILY AS DIRECTED 100 each 3  ? losartan (COZAAR) 50 MG tablet TAKE 1 TABLET(50 MG) BY MOUTH DAILY 90 tablet 0  ? methocarbamol (ROBAXIN) 500 MG tablet Take 1 tablet (500 mg total) by mouth every 8 (eight) hours as needed for muscle spasms. 90 tablet 0  ? montelukast (SINGULAIR) 10 MG tablet Take 1 tablet (10 mg total) by mouth at bedtime. 90 tablet 2  ? Multiple Vitamin (MULTIVITAMIN WITH MINERALS) TABS tablet Take 1 tablet by mouth in the morning. Centrum Silver    ? OXYGEN Inhale 2 L into the lungs continuous.    ? Potassium Chloride ER  20 MEQ TBCR TAKE 1 TABLET BY MOUTH DAILY 90 tablet 3  ? SUPREP BOWEL PREP KIT 17.5-3.13-1.6 GM/177ML SOLN Take 1 kit by mouth as directed. For colonoscopy prep 354 mL 0  ? tetrahydrozoline-zinc (VISINE-AC) 0.05-0.25 % ophthalmic solution Place 1 drop into both eyes 3 (three) times daily as needed (dry / irritated eyes).    ? topiramate (TOPAMAX) 25 MG capsule TAKE 3 CAPSULES(75 MG) BY MOUTH TWICE DAILY 180 capsule 2  ? ?No current facility-administered medications on file prior to visit.  ? ? ?Allergies  ?Allergen Reactions  ? Januvia [Sitagliptin]   ?  Chest pains  ? Tramadol Other (See Comments)  ?  hallucinations  ? ? ?Social History  ? ?Socioeconomic History  ? Marital status: Widowed  ?  Spouse name: Not on file  ? Number of children: 4  ? Years of education: Not on file  ? Highest education level: Not on file  ?Occupational History  ? Occupation: Disabled  ?Tobacco Use  ? Smoking status: Never  ?  Passive exposure: Never  ? Smokeless tobacco: Never  ?Vaping Use  ? Vaping Use: Never used  ?Substance and Sexual Activity  ? Alcohol use: No  ? Drug use: No  ? Sexual activity: Yes  ?Other Topics Concern  ? Not on file  ?Social History Narrative  ? Lives with daughter  ? Caffeine use: 1 cup coffee per day   ? ?Social Determinants of Health  ? ?Financial Resource Strain: Not on file  ?Food Insecurity: Not on file  ?Transportation Needs: Not on file  ?Physical Activity: Not on file  ?Stress: Not on file  ?Social C

## 2021-11-07 NOTE — Progress Notes (Signed)
Needs medication refills.

## 2021-11-13 ENCOUNTER — Telehealth: Payer: Self-pay

## 2021-11-13 NOTE — Telephone Encounter (Signed)
Patient was called and a Vm was left informing patient that paperwork is ready for pick up. ?

## 2021-11-22 DIAGNOSIS — I10 Essential (primary) hypertension: Secondary | ICD-10-CM | POA: Diagnosis not present

## 2021-11-22 DIAGNOSIS — J45909 Unspecified asthma, uncomplicated: Secondary | ICD-10-CM | POA: Diagnosis not present

## 2021-11-25 ENCOUNTER — Other Ambulatory Visit: Payer: Self-pay | Admitting: Internal Medicine

## 2021-11-25 DIAGNOSIS — E1169 Type 2 diabetes mellitus with other specified complication: Secondary | ICD-10-CM

## 2021-11-26 NOTE — Telephone Encounter (Signed)
Requested Prescriptions  Pending Prescriptions Disp Refills  . atorvastatin (LIPITOR) 40 MG tablet [Pharmacy Med Name: Atorvastatin Calcium 40 MG Oral Tablet] 90 tablet 2    Sig: TAKE 1 TABLET BY MOUTH DAILY     Cardiovascular:  Antilipid - Statins Failed - 11/25/2021 10:21 PM      Failed - Lipid Panel in normal range within the last 12 months    Cholesterol, Total  Date Value Ref Range Status  08/21/2021 120 100 - 199 mg/dL Final   LDL Chol Calc (NIH)  Date Value Ref Range Status  08/21/2021 50 0 - 99 mg/dL Final   HDL  Date Value Ref Range Status  08/21/2021 53 >39 mg/dL Final   Triglycerides  Date Value Ref Range Status  08/21/2021 91 0 - 149 mg/dL Final         Passed - Patient is not pregnant      Passed - Valid encounter within last 12 months    Recent Outpatient Visits          2 weeks ago Type 2 diabetes mellitus with morbid obesity ([place])   [place], Deborah B, MD   2 months ago No-show for appointment   [place] Karle Plumber B, MD   2 months ago Type 2 diabetes mellitus with morbid obesity [facility])   [place] Karle Plumber B, MD   3 months ago Uncontrolled type 2 diabetes mellitus with hyperglycemia [facility])   [place], Vernia Buff, NP   4 months ago No-show for appointment   [place], Deborah B, MD      Future Appointments            In 1 week Daisy Blossom, Jarome Matin, [place]   In 3 weeks Spero Geralds, MD [facility]   In 2 months Ladell Pier, MD [place]

## 2021-12-09 ENCOUNTER — Ambulatory Visit: Payer: Medicare Other | Admitting: Pharmacist

## 2021-12-09 NOTE — Progress Notes (Deleted)
S:    Heidi [name] is a 71 y.o. female who presents for hypertension evaluation, education, and management. PMH is significant for T2DM, HTN, HLD, HF (EF 65-70% in 2021), chronic migraine, OSA with Bipap, chronic cough, and GERD. Patient was referred and last seen by Primary Care Provider, Dr. Wynetta Emery, on 11/07/21. At last visit, A1c was above goal at 9.9 (from 10.4). At that time, patient declined further medications be added (including jardiance) and phone number provided for [facility] endocrinology.   Today, patient arrives in *** good spirits and presents without *** any assistance. ***  Patient reports Diabetes was diagnosed in ***.   Family/Social History: ***  Current diabetes medications include: *** Current hypertension medications include: *** Current hyperlipidemia medications include: ***  Patient reports taking all medications as prescribed. Patient {Actions; denies-reports:120008} adherence with medications. Patient reports missing {his/her/their:21314} medications *** times per week, on average.  Do you feel that your medications are working for you? {YES NO:22349} Have you been experiencing any side effects to the medications prescribed? {YES NO:22349} Do you have any problems obtaining medications due to transportation or finances? {YES [id number] Insurance coverage: ***  Patient {Actions; denies-reports:120008} hypoglycemic events.  Reported home fasting blood sugars: ***  Reported 2 hour post-meal/random blood sugars: ***.  Patient {Actions; denies-reports:120008} nocturia (nighttime urination).  Patient {Actions; denies-reports:120008} neuropathy (nerve pain). Patient {Actions; denies-reports:120008} visual changes. Patient {Actions; denies-reports:120008} self foot exams.   Patient reported dietary habits: Eats *** meals/day Breakfast: *** Lunch: *** Dinner: *** Snacks: *** Drinks: ***  Within the past 12 months, did you worry whether your food would  run out before you got money to buy more? {YES NO:22349} Within the past 12 months, did the food you bought run out, and you didn't have money to get more? {YES NO:22349} PHQ-9 Score: ***  Patient-reported exercise habits: ***   O:  Physical Exam  ROS  7 day average blood glucose: ***  Lab Results  Component Value Date   HGBA1C 9.9 (A) 11/07/2021   There were no vitals filed for this visit.  Lipid Panel     Component Value Date/Time   CHOL 120 08/21/2021 1522   TRIG 91 08/21/2021 1522   HDL 53 08/21/2021 1522   CHOLHDL 2.3 08/21/2021 1522   CHOLHDL 2.6 01/28/2021 0523   VLDL 30 01/28/2021 0523   LDLCALC 50 08/21/2021 1522    Clinical Atherosclerotic Cardiovascular Disease (ASCVD): {YES/NO:21197} The ASCVD Risk score (Arnett DK, et al., 2019) failed to calculate for the following reasons:   The valid total cholesterol range is 130 to 320 mg/dL    A/P: Diabetes longstanding *** currently ***. Patient is *** able to verbalize appropriate hypoglycemia management plan. Medication adherence appears ***. Control is suboptimal due to ***. -{Meds adjust:18428} basal insulin *** (insulin ***). Patient will continue to titrate 1 unit every *** days if fasting blood sugar > '100mg'$ /dl until fasting blood sugars reach goal or next visit.  -{Meds adjust:18428} rapid insulin *** (insulin ***) to ***.  -{Meds adjust:18428} GLP-1 *** (generic ***) to ***.  -{Meds adjust:18428} SGLT2-I *** (generic ***) to ***. Counseled on sick day rules. -{Meds adjust:18428} metformin *** to ***.  -Patient educated on purpose, proper use, and potential adverse effects of ***.  -Extensively discussed pathophysiology of diabetes, recommended lifestyle interventions, dietary effects on blood sugar control.  -Counseled on s/sx of and management of hypoglycemia.  -Next A1c anticipated ***.   ASCVD risk - primary ***secondary prevention in patient with diabetes.  Last LDL is *** not at goal of <70 *** mg/dL.  ASCVD risk factors include *** and 10-year ASCVD risk score of ***. {Desc; low/moderate/high:110033} intensity statin indicated.  -{Meds adjust:18428} ***statin *** mg.   Hypertension longstanding *** currently ***. Blood pressure goal of <130/80 *** mmHg. Medication adherence ***. Blood pressure control is suboptimal due to ***. -***  Written patient instructions provided. Patient verbalized understanding of treatment plan. Total time in face to face counseling *** minutes.    Follow up pharmacist *** PCP clinic visit in ***. Patient seen with ***.

## 2021-12-12 ENCOUNTER — Other Ambulatory Visit: Payer: Self-pay | Admitting: Internal Medicine

## 2021-12-12 DIAGNOSIS — E1169 Type 2 diabetes mellitus with other specified complication: Secondary | ICD-10-CM

## 2021-12-12 NOTE — Telephone Encounter (Signed)
Change in pharmacy- Rx forwarded to requested pharmacy Requested Prescriptions  Pending Prescriptions Disp Refills  . atorvastatin (LIPITOR) 40 MG tablet [Pharmacy Med Name: ATORVASTATIN '40MG'$  TABLETS] 90 tablet 2    Sig: TAKE 1 TABLET BY MOUTH DAILY AT 6 PM     Cardiovascular:  Antilipid - Statins Failed - 12/12/2021  6:22 AM      Failed - Lipid Panel in normal range within the last 12 months    Cholesterol, Total  Date Value Ref Range Status  08/21/2021 120 100 - 199 mg/dL Final   LDL Chol Calc (NIH)  Date Value Ref Range Status  08/21/2021 50 0 - 99 mg/dL Final   HDL  Date Value Ref Range Status  08/21/2021 53 >39 mg/dL Final   Triglycerides  Date Value Ref Range Status  08/21/2021 91 0 - 149 mg/dL Final         Passed - Patient is not pregnant      Passed - Valid encounter within last 12 months    Recent Outpatient Visits          1 month ago Type 2 diabetes mellitus with morbid obesity ([place])   [place] Ladell Pier, MD   2 months ago No-show for appointment   [place] Karle Plumber B, MD   3 months ago Type 2 diabetes mellitus with morbid obesity [facility])   [place] Karle Plumber B, MD   3 months ago Uncontrolled type 2 diabetes mellitus with hyperglycemia [facility])   [place], Vernia Buff, NP   4 months ago No-show for appointment   [place], Deborah B, MD      Future Appointments            In 6 days Spero Geralds, MD [place] [facility]   In 2 months Ladell Pier, MD [place]

## 2021-12-18 ENCOUNTER — Ambulatory Visit: Payer: Medicare Other | Admitting: Internal Medicine

## 2021-12-19 ENCOUNTER — Other Ambulatory Visit: Payer: Self-pay | Admitting: Internal Medicine

## 2021-12-19 DIAGNOSIS — I10 Essential (primary) hypertension: Secondary | ICD-10-CM

## 2021-12-19 DIAGNOSIS — E1122 Type 2 diabetes mellitus with diabetic chronic kidney disease: Secondary | ICD-10-CM

## 2021-12-19 NOTE — Telephone Encounter (Signed)
Requested Prescriptions  Pending Prescriptions Disp Refills  . losartan (COZAAR) 50 MG tablet [Pharmacy Med Name: LOSARTAN '50MG'$  TABLETS] 90 tablet 1    Sig: TAKE 1 TABLET(50 MG) BY MOUTH DAILY     Cardiovascular:  Angiotensin Receptor Blockers Passed - 12/19/2021  5:23 PM      Passed - Cr in normal range and within 180 days    Creat  Date Value Ref Range Status  05/12/2016 1.14 (H) 0.50 - 0.99 mg/dL Final    Comment:      For patients > or = 70 years of age: The upper reference limit for Creatinine is approximately 13% higher for people identified as African-American.      Creatinine, Ser  Date Value Ref Range Status  08/21/2021 0.97 0.57 - 1.00 mg/dL Final   Creatinine, Urine  Date Value Ref Range Status  11/27/2019 146.00 mg/dL Final    Comment:    Performed at [facility], [place] [address], [place], [place] [address]         Passed - K in normal range and within 180 days    Potassium  Date Value Ref Range Status  08/21/2021 4.2 3.5 - 5.2 mmol/L Final         Passed - Patient is not pregnant      Passed - Last BP in normal range    BP Readings from Last 1 Encounters:  11/07/21 118/70         Passed - Valid encounter within last 6 months    Recent Outpatient Visits          1 month ago Type 2 diabetes mellitus with morbid obesity ([place])   [place] Ladell Pier, MD   2 months ago No-show for appointment   [place] Karle Plumber B, MD   3 months ago Type 2 diabetes mellitus with morbid obesity [facility])   [place], MD   4 months ago Uncontrolled type 2 diabetes mellitus with hyperglycemia [facility])   [place], Vernia Buff, NP   5 months ago No-show for appointment   [place], Deborah B, MD      Future Appointments            In 1 month Shearon Stalls, Senaida Ores, MD  [place] [facility]   In 1 month Wynetta Emery Dalbert Batman, MD [place]

## 2021-12-19 NOTE — Telephone Encounter (Signed)
Medication Refill - Medication: INSULIN SYRINGE 1CC/29G 29G X 1/2" 1 ML MISC [[id number]]   Has the patient contacted their pharmacy? Yes.     Preferred Pharmacy (with phone number or street name):  [id number] - [place], [address] [place]  [place] [address]  Phone: [phone] Fax: [phone]  Hours: Open 24 hours    Has the patient been seen for an appointment in the last year OR does the patient have an upcoming appointment? Yes.    Agent: Please be advised that RX refills may take up to 3 business days. We ask that you follow-up with your pharmacy.

## 2021-12-20 ENCOUNTER — Other Ambulatory Visit: Payer: Self-pay | Admitting: Internal Medicine

## 2021-12-20 DIAGNOSIS — I10 Essential (primary) hypertension: Secondary | ICD-10-CM

## 2021-12-22 MED ORDER — "INSULIN SYRINGE 29G X 1/2"" 1 ML MISC": 3 refills | Status: DC

## 2021-12-22 NOTE — Telephone Encounter (Signed)
Requested Prescriptions  Pending Prescriptions Disp Refills  . INSULIN SYRINGE 1CC/29G 29G X 1/2" 1 ML MISC 100 each 3    Sig: USE THREE TIMES DAILY AS DIRECTED     There is no refill protocol information for this order

## 2021-12-23 DIAGNOSIS — I10 Essential (primary) hypertension: Secondary | ICD-10-CM | POA: Diagnosis not present

## 2021-12-23 DIAGNOSIS — J45909 Unspecified asthma, uncomplicated: Secondary | ICD-10-CM | POA: Diagnosis not present

## 2021-12-25 ENCOUNTER — Other Ambulatory Visit: Payer: Self-pay

## 2021-12-29 ENCOUNTER — Telehealth: Payer: Self-pay | Admitting: Neurology

## 2021-12-29 MED ORDER — BOTOX 200 UNITS IJ SOLR: INTRAMUSCULAR | 3 refills | Status: DC

## 2021-12-30 ENCOUNTER — Ambulatory Visit: Payer: Medicare Other | Admitting: Neurology

## 2021-12-30 NOTE — Telephone Encounter (Signed)
Pt called to reschedule Botox appt.

## 2022-01-01 ENCOUNTER — Other Ambulatory Visit: Payer: Self-pay | Admitting: Internal Medicine

## 2022-01-01 MED ORDER — HUMALOG MIX 75/25 (75-25) 100 UNIT/ML ~~LOC~~ SUSP: SUBCUTANEOUS | 2 refills | Status: DC

## 2022-01-01 NOTE — Telephone Encounter (Signed)
Pt called to reschedule Botox appt. Pt feeling like she is going to pass out. Would like a call back.

## 2022-01-01 NOTE — Telephone Encounter (Signed)
Requested Prescriptions  Pending Prescriptions Disp Refills  . insulin lispro protamine-lispro (HUMALOG MIX 75/25) (75-25) 100 UNIT/ML SUSP injection 100 mL 2    Sig: Inject 80 units in the morning and 45-55 units in the evening.     Endocrinology:  Diabetes - Insulins Failed - 01/01/2022  9:39 AM      Failed - HBA1C is between 0 and 7.9 and within 180 days    HbA1c, POC (controlled diabetic range)  Date Value Ref Range Status  11/07/2021 9.9 (A) 0.0 - 7.0 % Final         Passed - Valid encounter within last 6 months    Recent Outpatient Visits          1 month ago Type 2 diabetes mellitus with morbid obesity ([place])   [place], MD   3 months ago No-show for appointment   [place] Karle Plumber B, MD   3 months ago Type 2 diabetes mellitus with morbid obesity [facility])   [place] Karle Plumber B, MD   4 months ago Uncontrolled type 2 diabetes mellitus with hyperglycemia [facility])   [place], Vernia Buff, NP   5 months ago No-show for appointment   [place], Deborah B, MD      Future Appointments            In 1 month Shearon Stalls, Senaida Ores, MD [place] [facility]   In 1 month Wynetta Emery Dalbert Batman, MD [place]

## 2022-01-01 NOTE — Telephone Encounter (Signed)
Pt calling back to speak with Botox coordinator to reschedule appt. Pt said do not want to go to the emergency room because feeling like she is passing out is from not getting her Botox.

## 2022-01-01 NOTE — Telephone Encounter (Signed)
Medication Refill - Medication: insulin lispro protamine-lispro (HUMALOG MIX 75/25) (75-25) 100 UNIT/ML SUSP injection  Pantoprazole 40 MG Tablets (wants to resume)   Has the patient contacted their pharmacy? Yes.   (Agent: If no, request that the patient contact the pharmacy for the refill. If patient does not wish to contact the pharmacy document the reason why and proceed with request.) (Agent: If yes, when and what did the pharmacy advise?)  Preferred Pharmacy (with phone number or street name):  [facility] [place], [place]  [place] [address]  Phone: [phone] Fax: [phone]   Has the patient been seen for an appointment in the last year OR does the patient have an upcoming appointment? Yes.    Agent: Please be advised that RX refills may take up to 3 business days. We ask that you follow-up with your pharmacy.

## 2022-01-07 NOTE — Telephone Encounter (Signed)
Pt is calling to reschedule her BOTOX appointment.

## 2022-01-07 NOTE — Telephone Encounter (Signed)
I called pt Heidi [name] to discuss her reschedule, I also informed pt about her balance. Which needs to be discussed with billing before rescheduling her Botox appt.

## 2022-01-12 NOTE — Telephone Encounter (Signed)
Pt paid $71 toward balance on 7/6/23Thursday. Would like a call from Botox coordinator to schedule an appt.  Pt said will pay $45 toward the balance a month.

## 2022-01-14 ENCOUNTER — Telehealth: Payer: Self-pay | Admitting: Neurology

## 2022-01-14 NOTE — Telephone Encounter (Signed)
noted 

## 2022-01-14 NOTE — Telephone Encounter (Signed)
Received phone call from patient regarding her botox injection. We had a very prolonged phone call regarding her anger and hurt towards not being able to get her botox injections. I let her know it looks like she had some bad debt with our office and we had just needed to get that resolved before further scheduling. I verified with Angie that patient had made a payment and was ok to schedule for next Botox treatment. She was very irate that we were worried about "pennies on the dollar" for [facility] when [facility] is not worried about money and the money she owed was nothing to [name] and she couldn't believe we were more worried about pennies on the dollar than her health. Stated that she has almost passed out from not getting her Botox and feels her life has dramatically changed since not being able to receive this as she cannot just take any medication as it all hurts her stomach. I apologized for any issues she has been having and that everything has been resolved and transferred to [name] to get patient scheduled for Botox. She wanted Dr. Jaynee Eagles to know what was going on I told her I would let her know so this is just an [place]

## 2022-01-14 NOTE — Telephone Encounter (Signed)
I spoke with patient got her scheduled 01/28/22 @ 4:00

## 2022-01-14 NOTE — Telephone Encounter (Addendum)
Please send Botox RX to [facility] see if they can get it filled urgently. Also pt asking if something can be called in until the mean time.

## 2022-01-14 NOTE — Telephone Encounter (Signed)
error 

## 2022-01-14 NOTE — Telephone Encounter (Signed)
Pt called and stated " she needs her Botox shot. Transferred Pt to billing.

## 2022-01-15 ENCOUNTER — Other Ambulatory Visit: Payer: Self-pay | Admitting: Internal Medicine

## 2022-01-15 NOTE — Telephone Encounter (Signed)
Requested Prescriptions  Pending Prescriptions Disp Refills  . nitroGLYCERIN (NITROSTAT) 0.3 MG SL tablet [Pharmacy Med Name: NITROGLYCERIN  0.'3MG'$   TAB  SL] 200 tablet 2    Sig: DISSOLVE 1 TABLET UNDER THE  TONGUE EVERY 5 MINUTES AS NEEDED FOR CHEST PAIN. MAX OF 3 TABLETS IN 15 MINUTES. CALL 911 IF PAIN  PERSISTS.     Cardiovascular:  Nitrates Passed - 01/15/2022  9:40 AM      Passed - Last BP in normal range    BP Readings from Last 1 Encounters:  11/07/21 118/70         Passed - Last Heart Rate in normal range    Pulse Readings from Last 1 Encounters:  11/07/21 78         Passed - Valid encounter within last 12 months    Recent Outpatient Visits          2 months ago Type 2 diabetes mellitus with morbid obesity ([place])   [place], MD   3 months ago No-show for appointment   [place] Karle Plumber B, MD   4 months ago Type 2 diabetes mellitus with morbid obesity [facility])   [place], MD   4 months ago Uncontrolled type 2 diabetes mellitus with hyperglycemia [facility])   [place] Gildardo Pounds, NP   6 months ago No-show for appointment   [place]ler, Deborah B, MD      Future Appointments            In 3 weeks Spero Geralds, MD [facility]   In 4 weeks Ladell Pier, MD [place]

## 2022-01-15 NOTE — Telephone Encounter (Signed)
Pt asking if Dr. Jaynee Eagles can prescribe a medication to relief migraine until Botox appt. Pt said pain level is a nine. Can send prescription to [facility] #[id number]. Would like a call from the nurse.

## 2022-01-15 NOTE — Telephone Encounter (Signed)
I called pt and she has had migraine level 9, taking tylenol, which has not helped.  She does not have any other acute medications.  She is taking BOTOX only from [place] and get topiramate from other provider.  Last seen for OV 2018.  I recommended going to urgent care/ ED if she has no transportation 911, to take if migraine severe.  She did not want to see other NP.  She has appt 01-28-2022 for BOTOX.  She did not have mychart to do VV.  She was trying to get her botox sooner.  I was not able to move up appt.  She verbalized understanding.

## 2022-01-22 DIAGNOSIS — J45909 Unspecified asthma, uncomplicated: Secondary | ICD-10-CM | POA: Diagnosis not present

## 2022-01-22 DIAGNOSIS — I10 Essential (primary) hypertension: Secondary | ICD-10-CM | POA: Diagnosis not present

## 2022-01-23 ENCOUNTER — Other Ambulatory Visit: Payer: Self-pay

## 2022-01-26 NOTE — Telephone Encounter (Signed)
I called pt Heidi [name] to inform her that she needs to reach out to [facility] to give consent to ship [phone].

## 2022-01-28 ENCOUNTER — Ambulatory Visit: Payer: Medicare Other | Admitting: Neurology

## 2022-01-28 VITALS — Ht 64.0 in | Wt 245.0 lb

## 2022-01-28 DIAGNOSIS — G43009 Migraine without aura, not intractable, without status migrainosus: Secondary | ICD-10-CM

## 2022-01-28 DIAGNOSIS — G43709 Chronic migraine without aura, not intractable, without status migrainosus: Secondary | ICD-10-CM | POA: Diagnosis not present

## 2022-01-28 MED ORDER — UBRELVY 100 MG PO TABS: 100.0000 mg | ORAL_TABLET | ORAL | 11 refills | Status: DC | PRN

## 2022-01-28 MED ORDER — ONABOTULINUMTOXINA 200 UNITS IJ SOLR
155.0000 [IU] | Freq: Once | INTRAMUSCULAR | Status: AC
  Administered 2022-01-28: 155 [IU] via INTRAMUSCULAR

## 2022-01-28 NOTE — Progress Notes (Signed)
Botox- 200 units x 1 vial Lot: [id number] Expiration: 08/2024 NDC: 1552-0802-23  Bacteriostatic 0.9% Sodium Chloride- 57m total Lot: [id number] Expiration: 02/04/2023 NDC: [address]-75 Dx: GP00.511S/P

## 2022-01-28 NOTE — Patient Instructions (Signed)
Heidi [name]: Please take one tablet at the onset of your headache. If it does not improve the symptoms please take one additional tablet. Do not take more then 2 tablets in 24hrs.    Ubrogepant Tablets What is this medication? UBROGEPANT (ue BROE je pant) treats migraines. It works by blocking a substance in the body that causes migraines. It is not used to prevent migraines. This medicine may be used for other purposes; ask your health care provider or pharmacist if you have questions. COMMON BRAND NAME(S): Heidi [name] What should I tell my care team before I take this medication? They need to know if you have any of these conditions: Kidney disease Liver disease An unusual or allergic reaction to ubrogepant, other medications, foods, dyes, or preservatives Pregnant or trying to get pregnant Breast-feeding How should I use this medication? Take this medication by mouth with a glass of water. Take it as directed on the prescription label. You can take it with or without food. If it upsets your stomach, take it with food. Keep taking it unless your care team tells you to stop. Talk to your care team about the use of this medication in children. Special care may be needed. Overdosage: If you think you have taken too much of this medicine contact a poison control center or emergency room at once. NOTE: This medicine is only for you. Do not share this medicine with others. What if I miss a dose? This does not apply. This medication is not for regular use. What may interact with this medication? Do not take this medication with any of the following: Adagrasib Ceritinib Certain antibiotics, such as chloramphenicol, clarithromycin, telithromycin Certain antivirals for HIV, such as atazanavir, cobicistat, darunavir, delavirdine, fosamprenavir, indinavir, ritonavir Certain medications for fungal infections, such as itraconazole, ketoconazole, posaconazole,  voriconazole Conivaptan Grapefruit Idelalisib Mifepristone Nefazodone Ribociclib This medication may also interact with the following: Carvedilol Certain medications for seizures, such as phenobarbital, phenytoin Ciprofloxacin Cyclosporine Eltrombopag Fluconazole Fluvoxamine Quinidine Rifampin St. John's wort Verapamil This list may not describe all possible interactions. Give your health care provider a list of all the medicines, herbs, non-prescription drugs, or dietary supplements you use. Also tell them if you smoke, drink alcohol, or use illegal drugs. Some items may interact with your medicine. What should I watch for while using this medication? Visit your care team for regular checks on your progress. Tell your care team if your symptoms do not start to get better or if they get worse. Your mouth may get dry. Chewing sugarless gum or sucking hard candy and drinking plenty of water may help. Contact your care team if the problem does not go away or is severe. What side effects may I notice from receiving this medication? Side effects that you should report to your care team as soon as possible: Allergic reactions--skin rash, itching, hives, swelling of the face, lips, tongue, or throat Side effects that usually do not require medical attention (report to your care team if they continue or are bothersome): Drowsiness Dry mouth Fatigue Nausea This list may not describe all possible side effects. Call your doctor for medical advice about side effects. You may report side effects to FDA at 1-800-FDA-1088. Where should I keep my medication? Keep out of the reach of children and pets. Store between 15 and 30 degrees C (59 and 86 degrees F). Get rid of any unused medication after the expiration date. To get rid of medications that are no longer needed or have expired: Take  the medication to a medication take-back program. Check with your pharmacy or law enforcement to find a  location. If you cannot return the medication, check the label or package insert to see if the medication should be thrown out in the garbage or flushed down the toilet. If you are not sure, ask your care team. If it is safe to put it in the trash, pour the medication out of the container. Mix the medication with cat litter, dirt, coffee grounds, or other unwanted substance. Seal the mixture in a bag or container. Put it in the trash. NOTE: This sheet is a summary. It may not cover all possible information. If you have questions about this medicine, talk to your doctor, pharmacist, or health care provider.  2023 Elsevier/Gold Standard (2021-08-15 00:00:00)

## 2022-01-28 NOTE — Progress Notes (Signed)
01/28/2022: With the botox she only gets 4 migraines a month and < 6 total headache days a month. Needs acute management. Try Roselyn Meier. Failed sumatriptan and rizatriptan. Need to find out what dose of topamax she is on and refill  Meds ordered this encounter  Medications   botulinum toxin Type A (BOTOX) injection 155 Units    Botox- 200 units x 1 vial Lot: [id number] Expiration: 08/2024 NDC: 2878-6767-20  Bacteriostatic 0.9% Sodium Chloride- 43m total Lot: [id number] Expiration: 02/04/2023 NDC: [address]-36 Dx: G43.709 S/P   Ubrogepant (UBRELVY) 100 MG TABS    Sig: Take 100 mg by mouth every 2 (two) hours as needed. Maximum '200mg'$  a day.    Dispense:  16 tablet    Refill:  11        09/30/2021: stable Interval history 07/08/2020 STABLE: Patient with chronic intractable migraines. Still doing exceptionally well on Botox for Migraines, only botox and topiramate have ever helped. Prior tried multiple classes of medications which did not help with headaches or migraines until she started Botox. Since starting botox she has had  >95% improvement in frequency and severity, had to redo one botox and she did well again. The migraines she does have are sparse, and only recur when the botox is wearing off the last 2 weeks but still are easier to treat and >60% less severe.Nothing has worked in the past except botox and Topiramate.  No masseters or any other additional injections  Consent Form Botulism Toxin Injection For Chronic Migraine    Reviewed orally with patient, additionally signature is on file:  Botulism toxin has been approved by the Federal drug administration for treatment of chronic migraine. Botulism toxin does not cure chronic migraine and it may not be effective in some patients.  The administration of botulism toxin is accomplished by injecting a small amount of toxin into the muscles of the neck and head. Dosage must be titrated for each individual. Any benefits resulting  from botulism toxin tend to wear off after 3 months with a repeat injection required if benefit is to be maintained. Injections are usually done every 3-4 months with maximum effect peak achieved by about 2 or 3 weeks. Botulism toxin is expensive and you should be sure of what costs you will incur resulting from the injection.  The side effects of botulism toxin use for chronic migraine may include:   -Transient, and usually mild, facial weakness with facial injections  -Transient, and usually mild, head or neck weakness with head/neck injections  -Reduction or loss of forehead facial animation due to forehead muscle weakness  -Eyelid drooping  -Dry eye  -Pain at the site of injection or bruising at the site of injection  -Double vision  -Potential unknown long term risks  Contraindications: You should not have Botox if you are pregnant, nursing, allergic to albumin, have an infection, skin condition, or muscle weakness at the site of the injection, or have myasthenia gravis, Lambert-Eaton syndrome, or ALS.  It is also possible that as with any injection, there may be an allergic reaction or no effect from the medication. Reduced effectiveness after repeated injections is sometimes seen and rarely infection at the injection site may occur. All care will be taken to prevent these side effects. If therapy is given over a long time, atrophy and wasting in the muscle injected may occur. Occasionally the patient's become refractory to treatment because they develop antibodies to the toxin. In this event, therapy needs to be modified.  I have read the above information and consent to the administration of botulism toxin.    BOTOX PROCEDURE NOTE FOR MIGRAINE HEADACHE    Contraindications and precautions discussed with patient(above). Aseptic procedure was observed and patient tolerated procedure. Procedure performed by Dr. Georgia Dom  The condition has existed for more than 6 months, and pt does  not have a diagnosis of ALS, Myasthenia Gravis or Lambert-Eaton Syndrome.  Risks and benefits of injections discussed and pt agrees to proceed with the procedure.  Written consent obtained  These injections are medically necessary. Pt  receives good benefits from these injections. These injections do not cause sedations or hallucinations which the oral therapies may cause.  Description of procedure:  The patient was placed in a sitting position. The standard protocol was used for Botox as follows, with 5 units of Botox injected at each site:   -Procerus muscle, midline injection  -Corrugator muscle, bilateral injection  -Frontalis muscle, bilateral injection, with 2 sites each side, medial injection was performed in the upper one third of the frontalis muscle, in the region vertical from the medial inferior edge of the superior orbital rim. The lateral injection was again in the upper one third of the forehead vertically above the lateral limbus of the cornea, 1.5 cm lateral to the medial injection site.  -Temporalis muscle injection, 4 sites, bilaterally. The first injection was 3 cm above the tragus of the ear, second injection site was 1.5 cm to 3 cm up from the first injection site in line with the tragus of the ear. The third injection site was 1.5-3 cm forward between the first 2 injection sites. The fourth injection site was 1.5 cm posterior to the second injection site.   -Occipitalis muscle injection, 3 sites, bilaterally. The first injection was done one half way between the occipital protuberance and the tip of the mastoid process behind the ear. The second injection site was done lateral and superior to the first, 1 fingerbreadth from the first injection. The third injection site was 1 fingerbreadth superiorly and medially from the first injection site.  -Cervical paraspinal muscle injection, 2 sites, bilateral knee first injection site was 1 cm from the midline of the cervical spine,  3 cm inferior to the lower border of the occipital protuberance. The second injection site was 1.5 cm superiorly and laterally to the first injection site.  -Trapezius muscle injection was performed at 3 sites, bilaterally. The first injection site was in the upper trapezius muscle halfway between the inflection point of the neck, and the acromion. The second injection site was one half way between the acromion and the first injection site. The third injection was done between the first injection site and the inflection point of the neck.   Will return for repeat injection in 3 months.   155 units of Botox was used, 45U Botox not injected was wasted. The patient tolerated the procedure well, there were no complications of the above procedure.

## 2022-02-02 ENCOUNTER — Telehealth: Payer: Self-pay

## 2022-02-02 ENCOUNTER — Telehealth: Payer: Self-pay | Admitting: Neurology

## 2022-02-02 NOTE — Telephone Encounter (Signed)
Called pt & LVM with office number asking for call back.  

## 2022-02-02 NOTE — Telephone Encounter (Signed)
PA for Heidi [name] has been sent via cmm.  (Key: [id number])  Your information has been sent to [facility].

## 2022-02-02 NOTE — Telephone Encounter (Signed)
Can someone call patient and ask what dose her topamax she is on so we can refill for a year?

## 2022-02-03 ENCOUNTER — Other Ambulatory Visit: Payer: Self-pay | Admitting: *Deleted

## 2022-02-03 ENCOUNTER — Other Ambulatory Visit: Payer: Self-pay

## 2022-02-03 ENCOUNTER — Other Ambulatory Visit: Payer: Self-pay | Admitting: Neurology

## 2022-02-03 DIAGNOSIS — G43009 Migraine without aura, not intractable, without status migrainosus: Secondary | ICD-10-CM

## 2022-02-03 MED ORDER — NURTEC 75 MG PO TBDP
75.0000 mg | ORAL_TABLET | Freq: Every day | ORAL | 11 refills | Status: DC | PRN
  Filled 2022-02-03: qty 16, 30d supply, fill #0

## 2022-02-03 MED ORDER — TOPIRAMATE 25 MG PO CPSP: 75.0000 mg | ORAL_CAPSULE | Freq: Two times a day (BID) | ORAL | 11 refills | Status: DC

## 2022-02-03 NOTE — Telephone Encounter (Addendum)
Spoke with the patient. She states her Topamax is 25 mg and she takes 3 capsules twice daily. This matches MAR. Refills sent to pharmacy x 1 year.

## 2022-02-03 NOTE — Telephone Encounter (Addendum)
PA for Heidi [name] has been denied. Enclosed in the denial letter states Nurtec must be tried. I reviewed chart and could not locate where this has been tried/failled. Hopefully we can change to nurtec, will message Dr. Jaynee Eagles to verify.

## 2022-02-03 NOTE — Patient Outreach (Signed)
  Care Coordination   Initial Visit Note   02/03/2022 Name: Heidi [name] MRN: [id number] DOB: [birth year]  [name] is a 71 y.o. year old female who sees Ladell Pier, MD for primary care. Unsuccessful outreach attempt made to patient to introduce [name] left HIPAA compliant voicemail message along with her contact information.  Follow up plan: No further intervention required.  Encounter Outcome:  Pt. Visit Completed  Emelia Loron RN, BSN [place] [phone] [email or web address]

## 2022-02-03 NOTE — Addendum Note (Signed)
Addended by: Gildardo Griffes on: 02/03/2022 10:24 AM   Modules accepted: Orders

## 2022-02-04 ENCOUNTER — Telehealth: Payer: Self-pay

## 2022-02-04 NOTE — Telephone Encounter (Signed)
PA for nurtec has been sent. (Key: [id number])  Your information has been sent to [facility].

## 2022-02-04 NOTE — Telephone Encounter (Signed)
Dr. Jaynee Eagles has placed the order for nurtec. I will submit PA for med.

## 2022-02-04 NOTE — Telephone Encounter (Signed)
PA for Nurtec has been approved. Request Reference Number: [id number]. NURTEC TAB '75MG'$  ODT is approved through 07/05/2022. Your patient may now fill this prescription and it will be covered.

## 2022-02-05 ENCOUNTER — Ambulatory Visit: Payer: Medicare Other | Admitting: Internal Medicine

## 2022-02-10 DIAGNOSIS — I509 Heart failure, unspecified: Secondary | ICD-10-CM | POA: Insufficient documentation

## 2022-02-10 DIAGNOSIS — Z7982 Long term (current) use of aspirin: Secondary | ICD-10-CM | POA: Diagnosis not present

## 2022-02-10 DIAGNOSIS — R072 Precordial pain: Secondary | ICD-10-CM | POA: Diagnosis not present

## 2022-02-10 DIAGNOSIS — J9811 Atelectasis: Secondary | ICD-10-CM | POA: Diagnosis not present

## 2022-02-10 DIAGNOSIS — R0602 Shortness of breath: Secondary | ICD-10-CM | POA: Insufficient documentation

## 2022-02-10 DIAGNOSIS — Z20822 Contact with and (suspected) exposure to covid-19: Secondary | ICD-10-CM | POA: Insufficient documentation

## 2022-02-10 DIAGNOSIS — Z794 Long term (current) use of insulin: Secondary | ICD-10-CM | POA: Insufficient documentation

## 2022-02-10 DIAGNOSIS — R600 Localized edema: Secondary | ICD-10-CM | POA: Insufficient documentation

## 2022-02-10 DIAGNOSIS — R079 Chest pain, unspecified: Secondary | ICD-10-CM | POA: Diagnosis not present

## 2022-02-11 ENCOUNTER — Other Ambulatory Visit: Payer: Self-pay

## 2022-02-11 ENCOUNTER — Encounter (HOSPITAL_COMMUNITY): Payer: Self-pay

## 2022-02-11 ENCOUNTER — Emergency Department (HOSPITAL_COMMUNITY): Payer: Medicare Other

## 2022-02-11 ENCOUNTER — Emergency Department (HOSPITAL_COMMUNITY)
Admission: EM | Admit: 2022-02-11 | Discharge: 2022-02-11 | Disposition: A | Discharge status: Home / Self Care | Payer: Medicare Other | Attending: Emergency Medicine | Admitting: Emergency Medicine

## 2022-02-11 DIAGNOSIS — R0602 Shortness of breath: Secondary | ICD-10-CM | POA: Diagnosis not present

## 2022-02-11 DIAGNOSIS — R079 Chest pain, unspecified: Secondary | ICD-10-CM | POA: Diagnosis not present

## 2022-02-11 DIAGNOSIS — R0600 Dyspnea, unspecified: Secondary | ICD-10-CM

## 2022-02-11 DIAGNOSIS — J9811 Atelectasis: Secondary | ICD-10-CM | POA: Diagnosis not present

## 2022-02-11 LAB — CBC
HCT: 41.9 % (ref 36.0–46.0)
Hemoglobin: 12.4 g/dL (ref 12.0–15.0)
MCH: 27.9 pg (ref 26.0–34.0)
MCHC: 29.6 g/dL — ABNORMAL LOW (ref 30.0–36.0)
MCV: 94.2 fL (ref 80.0–100.0)
Platelets: 209 10*3/uL (ref 150–400)
RBC: 4.45 MIL/uL (ref 3.87–5.11)
RDW: 15.5 % (ref 11.5–15.5)
WBC: 9.3 10*3/uL (ref 4.0–10.5)
nRBC: 0.2 % (ref 0.0–0.2)

## 2022-02-11 LAB — BASIC METABOLIC PANEL
Anion gap: 7 (ref 5–15)
BUN: 13 mg/dL (ref 8–23)
CO2: 31 mmol/L (ref 22–32)
Calcium: 8.8 mg/dL — ABNORMAL LOW (ref 8.9–10.3)
Chloride: 105 mmol/L (ref 98–111)
Creatinine, Ser: 0.94 mg/dL (ref 0.44–1.00)
GFR, Estimated: >60 mL/min (ref >60)
Glucose, Bld: 119 mg/dL — ABNORMAL HIGH (ref 70–99)
Potassium: 4.3 mmol/L (ref 3.5–5.1)
Sodium: 143 mmol/L (ref 135–145)

## 2022-02-11 LAB — TROPONIN I (HIGH SENSITIVITY)
Troponin I (High Sensitivity): 4 ng/L (ref <18)
Troponin I (High Sensitivity): 5 ng/L (ref <18)

## 2022-02-11 LAB — PROTIME-INR
INR: 0.9 (ref 0.8–1.2)
Prothrombin Time: 12.1 seconds (ref 11.4–15.2)

## 2022-02-11 LAB — SARS CORONAVIRUS 2 BY RT PCR: SARS Coronavirus 2 by RT PCR: NEGATIVE

## 2022-02-11 LAB — BRAIN NATRIURETIC PEPTIDE: B Natriuretic Peptide: 48.8 pg/mL (ref 0.0–100.0)

## 2022-02-11 MED ORDER — SODIUM CHLORIDE (PF) 0.9 % IJ SOLN
INTRAMUSCULAR | Status: AC
  Filled 2022-02-11: qty 50

## 2022-02-11 MED ORDER — FUROSEMIDE 10 MG/ML IJ SOLN
40.0000 mg | Freq: Once | INTRAMUSCULAR | Status: AC
  Administered 2022-02-11: 40 mg via INTRAVENOUS
  Filled 2022-02-11: qty 4

## 2022-02-11 MED ORDER — ALBUTEROL SULFATE (2.5 MG/3ML) 0.083% IN NEBU
2.5000 mg | INHALATION_SOLUTION | Freq: Once | RESPIRATORY_TRACT | Status: AC
  Administered 2022-02-11: 2.5 mg via RESPIRATORY_TRACT
  Filled 2022-02-11: qty 3

## 2022-02-11 MED ORDER — IOHEXOL 350 MG/ML SOLN
100.0000 mL | Freq: Once | INTRAVENOUS | Status: AC | PRN
  Administered 2022-02-11: 100 mL via INTRAVENOUS

## 2022-02-11 NOTE — ED Provider Notes (Signed)
[place] DEPT Provider Note   CSN: [id number] Arrival date & time: 02/10/22  2358     History  Chief Complaint  Patient presents with   Chest Pain   Shortness of Breath    Heidi [name] is a 71 y.o. female.  The history is provided by the patient.  Chest Pain Pain location:  Substernal area Pain quality: sharp   Pain radiates to:  Does not radiate Pain severity:  Moderate Onset quality:  Gradual Duration:  2 weeks Progression:  Waxing and waning Chronicity:  Recurrent Context: at rest   Context comment:  Walking Relieved by:  Nothing Worsened by:  Nothing Ineffective treatments: only uses oxygen periodically. Associated symptoms: shortness of breath   Associated symptoms: no fever   Shortness of Breath Associated symptoms: chest pain   Associated symptoms: no fever   Patient with respiratory failure and CHF who is to be on continuous 2 L O2 at home presents with sharp chest pain and SOB.       Home Medications Prior to Admission medications   Medication Sig Start Date End Date Taking? Authorizing Provider  acetaminophen (TYLENOL) 500 MG tablet Take 500-1,000 mg by mouth every 6 (six) hours as needed (pain).   Yes [provider]  albuterol (PROVENTIL) (2.5 MG/3ML) 0.083% nebulizer solution USE 1 VIAL VIA NEBULIZER EVERY 6 HOURS AS NEEDED FOR WHEEZING OR SHORTNESS OF BREATH 02/21/18  Yes Ladell Pier, MD  albuterol (VENTOLIN HFA) 108 (90 Base) MCG/ACT inhaler Inhale 1-2 puffs into the lungs every 4 (four) hours as needed for wheezing or shortness of breath. 10/21/20  Yes Spero Geralds, MD  aspirin 81 MG chewable tablet Chew 81 mg by mouth daily.   Yes [provider]  atenolol (TENORMIN) 25 MG tablet TAKE 1 TABLET(25 MG) BY MOUTH DAILY Patient taking differently: Take 25 mg by mouth daily. 09/15/21  Yes Ladell Pier, MD  atorvastatin (LIPITOR) 40 MG tablet TAKE 1 TABLET BY MOUTH DAILY AT 6 PM Patient  taking differently: Take 40 mg by mouth every evening. 12/12/21  Yes Ladell Pier, MD  Botulinum Toxin Type A (BOTOX) 200 units SOLR Provider to inject 155 units into the muscles of the head and neck every 12 weeks. Discard remainder. 12/29/21  Yes Melvenia Beam, MD  famotidine (PEPCID) 20 MG tablet Take 1 tablet (20 mg total) by mouth daily. 11/07/21  Yes Ladell Pier, MD  fluticasone-salmeterol (ADVAIR DISKUS) 100-50 MCG/ACT AEPB Inhale 1 puff into the lungs 2 (two) times daily. 10/09/21  Yes Margaretha Seeds, MD  furosemide (LASIX) 40 MG tablet TAKE 1 TABLET(40 MG) BY MOUTH DAILY Patient taking differently: Take 40 mg by mouth daily. 11/07/21  Yes Ladell Pier, MD  insulin lispro protamine-lispro (HUMALOG MIX 75/25) (75-25) 100 UNIT/ML SUSP injection Inject 80 units in the morning and 45-55 units in the evening. 01/01/22  Yes Ladell Pier, MD  loratadine (CLARITIN) 10 MG tablet Take 1 tablet (10 mg total) by mouth daily as needed for allergies. 11/07/21  Yes Ladell Pier, MD  losartan (COZAAR) 50 MG tablet TAKE 1 TABLET(50 MG) BY MOUTH DAILY Patient taking differently: Take 50 mg by mouth daily. 12/19/21  Yes Ladell Pier, MD  montelukast (SINGULAIR) 10 MG tablet Take 1 tablet (10 mg total) by mouth at bedtime. Patient taking differently: Take 10 mg by mouth at bedtime as needed (for allergies). 03/18/20  Yes Tanda Rockers, MD  Multiple Vitamin (  MULTIVITAMIN WITH MINERALS) TABS tablet Take 1 tablet by mouth in the morning. Centrum Silver   Yes [provider]  nitroGLYCERIN (NITROSTAT) 0.3 MG SL tablet DISSOLVE 1 TABLET UNDER THE  TONGUE EVERY 5 MINUTES AS NEEDED FOR CHEST PAIN. MAX OF 3 TABLETS IN 15 MINUTES. CALL 911 IF PAIN  PERSISTS. Patient taking differently: Place 0.3 mg under the tongue every 5 (five) minutes as needed for chest pain. 01/15/22  Yes Ladell Pier, MD  Potassium Chloride ER 20 MEQ TBCR TAKE 1 TABLET BY MOUTH DAILY Patient taking  differently: Take 20 mEq by mouth daily. 03/01/21  Yes Ladell Pier, MD  tetrahydrozoline-zinc (VISINE-AC) 0.05-0.25 % ophthalmic solution Place 1 drop into both eyes 3 (three) times daily as needed (dry / irritated eyes).   Yes [provider]  topiramate (TOPAMAX) 25 MG capsule Take 3 capsules (75 mg total) by mouth 2 (two) times daily. 02/03/22  Yes Melvenia Beam, MD  Accu-Chek FastClix Lancets MISC Use as directed to test blood sugar three times daily DX E11.65 07/09/21   Ladell Pier, MD  Blood Glucose Monitoring Suppl (ACCU-CHEK GUIDE ME) w/Device KIT Use as directed to test blood sugar three times daily DX E11.65 07/09/21   Ladell Pier, MD  glucose blood (ACCU-CHEK GUIDE) test strip Use as directed to test blood sugar three times daily DX E11.65 07/09/21   Ladell Pier, MD  INSULIN SYRINGE 1CC/29G 29G X 1/2" 1 ML MISC USE THREE TIMES DAILY AS DIRECTED 12/22/21   Ladell Pier, MD  OXYGEN Inhale 2 L into the lungs continuous.    [provider]  Rimegepant Sulfate (NURTEC) 75 MG TBDP Take 75 mg by mouth daily as needed. For migraines. Take as close to onset of migraine as possible. One daily maximum. Patient not taking: Reported on 02/11/2022 02/03/22   Melvenia Beam, MD  SUPREP BOWEL PREP KIT 17.5-3.13-1.6 GM/177ML SOLN Take 1 kit by mouth as directed. For colonoscopy prep 03/05/21   Noralyn Pick, NP      Allergies    Januvia [sitagliptin] and Tramadol    Review of Systems   Review of Systems  Constitutional:  Negative for fever.  HENT:  Negative for facial swelling.   Respiratory:  Positive for shortness of breath.   Cardiovascular:  Positive for chest pain and leg swelling.  All other systems reviewed and are negative.   Physical Exam Updated Vital Signs BP (!) 130/45 (BP Location: Left Arm)   Pulse 71   Temp 98.3 F (36.8 C) (Oral)   Resp (!) 22   Ht _0  (1.626 m)   Wt 111.1 kg   SpO2 96%   BMI 42.05 kg/m  Physical  Exam Vitals and nursing note reviewed.  Constitutional:      General: She is not in acute distress.    Appearance: Normal appearance. She is well-developed.  HENT:     Head: Normocephalic and atraumatic.  Eyes:     Pupils: Pupils are equal, round, and reactive to light.  Cardiovascular:     Rate and Rhythm: Normal rate and regular rhythm.     Pulses: Normal pulses.     Heart sounds: Normal heart sounds.  Pulmonary:     Effort: Pulmonary effort is normal. No respiratory distress.     Breath sounds: Normal breath sounds.  Abdominal:     General: Bowel sounds are normal. There is no distension.     Palpations: Abdomen is soft.  Tenderness: There is no abdominal tenderness. There is no guarding or rebound.  Genitourinary:    Vagina: No vaginal discharge.  Musculoskeletal:        General: Normal range of motion.     Cervical back: Neck supple.     Right lower leg: Edema present.     Left lower leg: Edema present.     Comments: No the shins   Skin:    General: Skin is dry.     Capillary Refill: Capillary refill takes less than 2 seconds.     Findings: No erythema or rash.  Neurological:     General: No focal deficit present.     Mental Status: She is alert.     Deep Tendon Reflexes: Reflexes normal.  Psychiatric:        Mood and Affect: Mood normal.     ED Results / Procedures / Treatments   Labs (all labs ordered are listed, but only abnormal results are displayed) Results for orders placed or performed during the hospital encounter of 02/11/22  SARS Coronavirus 2 by RT PCR (hospital order, performed in [name] hospital lab) *cepheid single result test* Anterior Nasal Swab   Specimen: Anterior Nasal Swab  Result Value Ref Range   SARS Coronavirus 2 by RT PCR NEGATIVE NEGATIVE  Basic metabolic panel  Result Value Ref Range   Sodium 143 135 - 145 mmol/L   Potassium 4.3 3.5 - 5.1 mmol/L   Chloride 105 98 - 111 mmol/L   CO2 31 22 - 32 mmol/L   Glucose, Bld 119 (H)  70 - 99 mg/dL   BUN 13 8 - 23 mg/dL   Creatinine, Ser 0.94 0.44 - 1.00 mg/dL   Calcium 8.8 (L) 8.9 - 10.3 mg/dL   GFR, Estimated >60 >60 mL/min   Anion gap 7 5 - 15  CBC  Result Value Ref Range   WBC 9.3 4.0 - 10.5 K/uL   RBC 4.45 3.87 - 5.11 MIL/uL   Hemoglobin 12.4 12.0 - 15.0 g/dL   HCT 41.9 36.0 - 46.0 %   MCV 94.2 80.0 - 100.0 fL   MCH 27.9 26.0 - 34.0 pg   MCHC 29.6 (L) 30.0 - 36.0 g/dL   RDW 15.5 11.5 - 15.5 %   Platelets 209 150 - 400 K/uL   nRBC 0.2 0.0 - 0.2 %  Protime-INR (order if Patient is taking Coumadin / Warfarin)  Result Value Ref Range   Prothrombin Time 12.1 11.4 - 15.2 seconds   INR 0.9 0.8 - 1.2  Brain natriuretic peptide  Result Value Ref Range   B Natriuretic Peptide 48.8 0.0 - 100.0 pg/mL  Troponin I (High Sensitivity)  Result Value Ref Range   Troponin I (High Sensitivity) 4 <18 ng/L  Troponin I (High Sensitivity)  Result Value Ref Range   Troponin I (High Sensitivity) 5 <18 ng/L   CT Angio Chest PE W and/or Wo Contrast  Result Date: 02/11/2022 CLINICAL DATA:  Chest pain or SOB, pleurisy or effusion suspected. EXAM: CT ANGIOGRAPHY CHEST WITH CONTRAST TECHNIQUE: Multidetector CT imaging of the chest was performed using the standard protocol during bolus administration of intravenous contrast. Multiplanar CT image reconstructions and MIPs were obtained to evaluate the vascular anatomy. RADIATION DOSE REDUCTION: This exam was performed according to the departmental dose-optimization program which includes automated exposure control, adjustment of the mA and/or kV according to patient size and/or use of iterative reconstruction technique. CONTRAST:  150m OMNIPAQUE IOHEXOL 350 MG/ML SOLN COMPARISON:  01/27/2021  FINDINGS: Cardiovascular: There is adequate opacification of the pulmonary arterial tree. No intraluminal filling defect is identified to suggest acute pulmonary embolism. Central pulmonary arteries are enlarged in keeping with changes of pulmonary  arterial hypertension. Moderate multi-vessel coronary artery calcification. Mild global cardiomegaly. Trace pericardial effusion, similar to prior examination. Mild atherosclerotic calcification within the thoracic aorta. No aortic aneurysm. Mediastinum/Nodes: No pathologic thoracic adenopathy. Esophagus unremarkable. Lungs/Pleura: Mild bibasilar atelectasis. Lungs are otherwise clear. No pneumothorax or pleural effusion. Upper Abdomen: No acute abnormality. Partially visualized exophytic cystic lesion arising from the upper pole of the left kidney demonstrating mild capsular calcification again noted measuring at least 6.9 cm in greatest dimension. This is incompletely assessed on this exam Musculoskeletal: No chest wall abnormality. No acute or significant osseous findings. Review of the MIP images confirms the above findings. IMPRESSION: 1. No pulmonary embolism. 2. Moderate multi-vessel coronary artery calcification. Mild global cardiomegaly. Trace pericardial effusion. 3. Morphologic changes in keeping with pulmonary arterial hypertension. Electronically Signed   By: Fidela Salisbury M.D.   On: 02/11/2022 04:18   DG Chest 2 View  Result Date: 02/11/2022 CLINICAL DATA:  Chest pain and shortness of breath. EXAM: CHEST - 2 VIEW COMPARISON:  Radiograph 10/09/2021 FINDINGS: Mild cardiomegaly. Stable mediastinal contours. Vascular congestion. Mild peribronchial thickening may be bronchitic or congestive. There may be small pleural effusions. Streaky atelectasis at the right lung base. No pneumothorax. Detailed assessment limited by habitus. IMPRESSION: Mild cardiomegaly with vascular congestion. Peribronchial thickening may be bronchitic or congestive. Possible small pleural effusions. Electronically Signed   By: Keith Rake M.D.   On: 02/11/2022 00:56     EKG EKG Interpretation  Date/Time:  Wednesday February 11 2022 00:16:00 EDT Ventricular Rate:  86 PR Interval:  167 QRS Duration: 81 QT  Interval:  349 QTC Calculation: 418 R Axis:   -23 Text Interpretation: Sinus rhythm Low voltage, extremity and precordial leads Confirmed by Randal Buba, Tempie Gibeault ([id number]) on 02/11/2022 12:32:56 AM  Radiology CT Angio Chest PE W and/or Wo Contrast  Result Date: 02/11/2022 CLINICAL DATA:  Chest pain or SOB, pleurisy or effusion suspected. EXAM: CT ANGIOGRAPHY CHEST WITH CONTRAST TECHNIQUE: Multidetector CT imaging of the chest was performed using the standard protocol during bolus administration of intravenous contrast. Multiplanar CT image reconstructions and MIPs were obtained to evaluate the vascular anatomy. RADIATION DOSE REDUCTION: This exam was performed according to the departmental dose-optimization program which includes automated exposure control, adjustment of the mA and/or kV according to patient size and/or use of iterative reconstruction technique. CONTRAST:  153m OMNIPAQUE IOHEXOL 350 MG/ML SOLN COMPARISON:  01/27/2021 FINDINGS: Cardiovascular: There is adequate opacification of the pulmonary arterial tree. No intraluminal filling defect is identified to suggest acute pulmonary embolism. Central pulmonary arteries are enlarged in keeping with changes of pulmonary arterial hypertension. Moderate multi-vessel coronary artery calcification. Mild global cardiomegaly. Trace pericardial effusion, similar to prior examination. Mild atherosclerotic calcification within the thoracic aorta. No aortic aneurysm. Mediastinum/Nodes: No pathologic thoracic adenopathy. Esophagus unremarkable. Lungs/Pleura: Mild bibasilar atelectasis. Lungs are otherwise clear. No pneumothorax or pleural effusion. Upper Abdomen: No acute abnormality. Partially visualized exophytic cystic lesion arising from the upper pole of the left kidney demonstrating mild capsular calcification again noted measuring at least 6.9 cm in greatest dimension. This is incompletely assessed on this exam Musculoskeletal: No chest wall abnormality. No acute  or significant osseous findings. Review of the MIP images confirms the above findings. IMPRESSION: 1. No pulmonary embolism. 2. Moderate multi-vessel coronary artery calcification. Mild global cardiomegaly. Trace pericardial  effusion. 3. Morphologic changes in keeping with pulmonary arterial hypertension. Electronically Signed   By: Fidela Salisbury M.D.   On: 02/11/2022 04:18   DG Chest 2 View  Result Date: 02/11/2022 CLINICAL DATA:  Chest pain and shortness of breath. EXAM: CHEST - 2 VIEW COMPARISON:  Radiograph 10/09/2021 FINDINGS: Mild cardiomegaly. Stable mediastinal contours. Vascular congestion. Mild peribronchial thickening may be bronchitic or congestive. There may be small pleural effusions. Streaky atelectasis at the right lung base. No pneumothorax. Detailed assessment limited by habitus. IMPRESSION: Mild cardiomegaly with vascular congestion. Peribronchial thickening may be bronchitic or congestive. Possible small pleural effusions. Electronically Signed   By: Keith Rake M.D.   On: 02/11/2022 00:56    Procedures Procedures    Medications Ordered in ED Medications  albuterol (PROVENTIL) (2.5 MG/3ML) 0.083% nebulizer solution 2.5 mg (has no administration in time range)  furosemide (LASIX) injection 40 mg (40 mg Intravenous Given 02/11/22 0214)  iohexol (OMNIPAQUE) 350 MG/ML injection 100 mL (100 mLs Intravenous Contrast Given 02/11/22 0330)  sodium chloride (PF) 0.9 % injection (  Given by Other 02/11/22 0329)    ED Course/ Medical Decision Making/ A&P                           Medical Decision Making Amount and/or Complexity of Data Reviewed External Data Reviewed: notes.    Details: previous notes reviewed:  Oxygen parameters reviewed Labs: ordered.    Details: all labs reviewed: negative covid.  Normal sodium 143, normal potassium 4.3, creatinine .94.  normal white count 9.3, hemoglobin 12.4 normal platelets Normal coags.  2 negative troponins 5 and 4. Radiology: ordered and  independent interpretation performed.    Details: No PEs on CTA by me ECG/medicine tests: ordered and independent interpretation performed. Decision-making details documented in ED Course. Discussion of management or test interpretation with external provider(s): Case d/w Dr. Bridgett Larsson, no indication for admission   Risk Prescription drug management. Risk Details: Ruled out for MI and PE.  When she walks she meets here home criteria.  She meets criteria at rest.  Stable for discharge.  Strict return.      Final Clinical Impression(s) / ED Diagnoses Final diagnoses:  None   Return for intractable cough, coughing up blood, fevers > 100.4 unrelieved by medication, shortness of breath, intractable vomiting, chest pain, shortness of breath, weakness, numbness, changes in speech, facial asymmetry, abdominal pain, passing out, Inability to tolerate liquids or food, cough, altered mental status or any concerns. No signs of systemic illness or infection. The patient is nontoxic-appearing on exam and vital signs are within normal limits.  I have reviewed the triage vital signs and the nursing notes. Pertinent labs & imaging results that were available during my care of the patient were reviewed by me and considered in my medical decision making (see chart for details). After history, exam, and medical workup I feel the patient has been appropriately medically screened and is safe for discharge home. Pertinent diagnoses were discussed with the patient. Patient was given return precautions.    Rx / DC Orders ED Discharge Orders     None         Elham Fini, MD 02/11/22 [phone]

## 2022-02-11 NOTE — ED Triage Notes (Signed)
Patient coming to ED for evaluation of chest pain and shortness of breath.  Reports increased difficulty breathing when attempting to ambulate short distances.  Wears oxygen at baseline

## 2022-02-11 NOTE — Progress Notes (Signed)
Admission declined. Pt suppose to wear 2 L/min O2 continuously but fails to do so of her own choice. sats 94-96% while wearing O2 while she is ambulating. No acute need for hospitalization. Pt already established with Dr. Loanne Drilling with pulmonology. Pt failed to get her PFTs in June that were arranged.  Discussed with EDP.  Kristopher Oppenheim, DO [facility]

## 2022-02-11 NOTE — ED Notes (Signed)
Ambulated patient in the hall without oxygen. SPO2 in the 80's. Upon returning to exam room, patient placed on 3 lpm [place] with increased sats to 95%. Tolerated moderately well overall. Dr. Randal Buba made aware.

## 2022-02-12 ENCOUNTER — Ambulatory Visit: Payer: Medicare Other | Admitting: Internal Medicine

## 2022-02-22 DIAGNOSIS — I10 Essential (primary) hypertension: Secondary | ICD-10-CM | POA: Diagnosis not present

## 2022-02-22 DIAGNOSIS — J45909 Unspecified asthma, uncomplicated: Secondary | ICD-10-CM | POA: Diagnosis not present

## 2022-02-27 ENCOUNTER — Other Ambulatory Visit: Payer: Self-pay | Admitting: Internal Medicine

## 2022-02-27 DIAGNOSIS — I10 Essential (primary) hypertension: Secondary | ICD-10-CM

## 2022-02-27 NOTE — Telephone Encounter (Signed)
Requested Prescriptions  Pending Prescriptions Disp Refills  . atenolol (TENORMIN) 25 MG tablet [Pharmacy Med Name: ATENOLOL '25MG'$  TABLETS] 90 tablet 0    Sig: TAKE 1 TABLET(25 MG) BY MOUTH DAILY     Cardiovascular: Beta Blockers 2 Failed - 02/27/2022  5:24 AM      Failed - Last BP in normal range    BP Readings from Last 1 Encounters:  02/11/22 (!) 130/45         Passed - Cr in normal range and within 360 days    Creat  Date Value Ref Range Status  05/12/2016 1.14 (H) 0.50 - 0.99 mg/dL Final    Comment:      For patients > or = 70 years of age: The upper reference limit for Creatinine is approximately 13% higher for people identified as African-American.      Creatinine, Ser  Date Value Ref Range Status  02/11/2022 0.94 0.44 - 1.00 mg/dL Final   Creatinine, Urine  Date Value Ref Range Status  11/27/2019 146.00 mg/dL Final    Comment:    Performed at [facility], [place] [address], [place], [place] [id number]         Passed - Last Heart Rate in normal range    Pulse Readings from Last 1 Encounters:  02/11/22 72         Passed - Valid encounter within last 6 months    Recent Outpatient Visits          3 months ago Type 2 diabetes mellitus with morbid obesity ([place])   [place], MD   5 months ago No-show for appointment   [place] Karle Plumber B, MD   5 months ago Type 2 diabetes mellitus with morbid obesity [facility])   [place], MD   6 months ago Uncontrolled type 2 diabetes mellitus with hyperglycemia [facility])   [place] Gildardo Pounds, NP   7 months ago No-show for appointment   [place], MD      Future Appointments            In 5 days Thereasa Solo, [place] Bilis [place]

## 2022-03-02 ENCOUNTER — Other Ambulatory Visit: Payer: Self-pay

## 2022-03-02 ENCOUNTER — Emergency Department (HOSPITAL_COMMUNITY): Payer: Medicare Other

## 2022-03-02 ENCOUNTER — Inpatient Hospital Stay (HOSPITAL_COMMUNITY)
Admission: EM | Admit: 2022-03-02 | Discharge: 2022-03-07 | DRG: 286 | Disposition: A | Discharge status: Home / Self Care | Payer: Medicare Other | Attending: Internal Medicine | Admitting: Internal Medicine

## 2022-03-02 ENCOUNTER — Encounter (HOSPITAL_COMMUNITY): Payer: Self-pay

## 2022-03-02 DIAGNOSIS — E11319 Type 2 diabetes mellitus with unspecified diabetic retinopathy without macular edema: Secondary | ICD-10-CM | POA: Diagnosis not present

## 2022-03-02 DIAGNOSIS — I517 Cardiomegaly: Secondary | ICD-10-CM | POA: Diagnosis not present

## 2022-03-02 DIAGNOSIS — N182 Chronic kidney disease, stage 2 (mild): Secondary | ICD-10-CM | POA: Diagnosis not present

## 2022-03-02 DIAGNOSIS — I5033 Acute on chronic diastolic (congestive) heart failure: Secondary | ICD-10-CM | POA: Diagnosis present

## 2022-03-02 DIAGNOSIS — Z800 Family history of malignant neoplasm of digestive organs: Secondary | ICD-10-CM

## 2022-03-02 DIAGNOSIS — R0789 Other chest pain: Secondary | ICD-10-CM | POA: Diagnosis not present

## 2022-03-02 DIAGNOSIS — E1165 Type 2 diabetes mellitus with hyperglycemia: Secondary | ICD-10-CM | POA: Diagnosis not present

## 2022-03-02 DIAGNOSIS — J811 Chronic pulmonary edema: Secondary | ICD-10-CM | POA: Diagnosis not present

## 2022-03-02 DIAGNOSIS — E1122 Type 2 diabetes mellitus with diabetic chronic kidney disease: Secondary | ICD-10-CM | POA: Diagnosis present

## 2022-03-02 DIAGNOSIS — J4541 Moderate persistent asthma with (acute) exacerbation: Secondary | ICD-10-CM | POA: Diagnosis present

## 2022-03-02 DIAGNOSIS — Z79899 Other long term (current) drug therapy: Secondary | ICD-10-CM

## 2022-03-02 DIAGNOSIS — I200 Unstable angina: Principal | ICD-10-CM | POA: Diagnosis present

## 2022-03-02 DIAGNOSIS — J9620 Acute and chronic respiratory failure, unspecified whether with hypoxia or hypercapnia: Secondary | ICD-10-CM | POA: Diagnosis not present

## 2022-03-02 DIAGNOSIS — J4540 Moderate persistent asthma, uncomplicated: Secondary | ICD-10-CM | POA: Diagnosis present

## 2022-03-02 DIAGNOSIS — J449 Chronic obstructive pulmonary disease, unspecified: Secondary | ICD-10-CM | POA: Diagnosis not present

## 2022-03-02 DIAGNOSIS — Z8679 Personal history of other diseases of the circulatory system: Secondary | ICD-10-CM

## 2022-03-02 DIAGNOSIS — J9611 Chronic respiratory failure with hypoxia: Secondary | ICD-10-CM | POA: Diagnosis not present

## 2022-03-02 DIAGNOSIS — I10 Essential (primary) hypertension: Secondary | ICD-10-CM | POA: Diagnosis not present

## 2022-03-02 DIAGNOSIS — G4733 Obstructive sleep apnea (adult) (pediatric): Secondary | ICD-10-CM | POA: Diagnosis not present

## 2022-03-02 DIAGNOSIS — I5032 Chronic diastolic (congestive) heart failure: Secondary | ICD-10-CM | POA: Diagnosis not present

## 2022-03-02 DIAGNOSIS — Z86010 Personal history of colonic polyps: Secondary | ICD-10-CM

## 2022-03-02 DIAGNOSIS — E785 Hyperlipidemia, unspecified: Secondary | ICD-10-CM | POA: Diagnosis present

## 2022-03-02 DIAGNOSIS — I2721 Secondary pulmonary arterial hypertension: Secondary | ICD-10-CM | POA: Diagnosis present

## 2022-03-02 DIAGNOSIS — J9621 Acute and chronic respiratory failure with hypoxia: Secondary | ICD-10-CM | POA: Diagnosis not present

## 2022-03-02 DIAGNOSIS — Z90710 Acquired absence of both cervix and uterus: Secondary | ICD-10-CM | POA: Diagnosis not present

## 2022-03-02 DIAGNOSIS — I5030 Unspecified diastolic (congestive) heart failure: Secondary | ICD-10-CM | POA: Diagnosis present

## 2022-03-02 DIAGNOSIS — I509 Heart failure, unspecified: Secondary | ICD-10-CM | POA: Diagnosis not present

## 2022-03-02 DIAGNOSIS — I480 Paroxysmal atrial fibrillation: Secondary | ICD-10-CM | POA: Diagnosis not present

## 2022-03-02 DIAGNOSIS — G43909 Migraine, unspecified, not intractable, without status migrainosus: Secondary | ICD-10-CM | POA: Diagnosis not present

## 2022-03-02 DIAGNOSIS — R079 Chest pain, unspecified: Secondary | ICD-10-CM | POA: Diagnosis present

## 2022-03-02 DIAGNOSIS — R0609 Other forms of dyspnea: Secondary | ICD-10-CM | POA: Diagnosis not present

## 2022-03-02 DIAGNOSIS — Z833 Family history of diabetes mellitus: Secondary | ICD-10-CM

## 2022-03-02 DIAGNOSIS — Z9981 Dependence on supplemental oxygen: Secondary | ICD-10-CM | POA: Diagnosis not present

## 2022-03-02 DIAGNOSIS — I130 Hypertensive heart and chronic kidney disease with heart failure and stage 1 through stage 4 chronic kidney disease, or unspecified chronic kidney disease: Secondary | ICD-10-CM | POA: Diagnosis present

## 2022-03-02 DIAGNOSIS — Z6841 Body Mass Index (BMI) 40.0 and over, adult: Secondary | ICD-10-CM | POA: Diagnosis not present

## 2022-03-02 DIAGNOSIS — Z794 Long term (current) use of insulin: Secondary | ICD-10-CM | POA: Diagnosis not present

## 2022-03-02 DIAGNOSIS — Z8709 Personal history of other diseases of the respiratory system: Secondary | ICD-10-CM

## 2022-03-02 DIAGNOSIS — R0602 Shortness of breath: Secondary | ICD-10-CM | POA: Diagnosis not present

## 2022-03-02 DIAGNOSIS — R9431 Abnormal electrocardiogram [ECG] [EKG]: Secondary | ICD-10-CM | POA: Diagnosis not present

## 2022-03-02 DIAGNOSIS — E7800 Pure hypercholesterolemia, unspecified: Secondary | ICD-10-CM | POA: Diagnosis not present

## 2022-03-02 LAB — CBC
HCT: 41.5 % (ref 36.0–46.0)
Hemoglobin: 12.3 g/dL (ref 12.0–15.0)
MCH: 27.6 pg (ref 26.0–34.0)
MCHC: 29.6 g/dL — ABNORMAL LOW (ref 30.0–36.0)
MCV: 93.3 fL (ref 80.0–100.0)
Platelets: 208 10*3/uL (ref 150–400)
RBC: 4.45 MIL/uL (ref 3.87–5.11)
RDW: 14.9 % (ref 11.5–15.5)
WBC: 8.5 10*3/uL (ref 4.0–10.5)
nRBC: 0.2 % (ref 0.0–0.2)

## 2022-03-02 LAB — BASIC METABOLIC PANEL
Anion gap: 9 (ref 5–15)
BUN: 10 mg/dL (ref 8–23)
CO2: 30 mmol/L (ref 22–32)
Calcium: 9.3 mg/dL (ref 8.9–10.3)
Chloride: 101 mmol/L (ref 98–111)
Creatinine, Ser: 1 mg/dL (ref 0.44–1.00)
GFR, Estimated: >60 mL/min (ref >60)
Glucose, Bld: 79 mg/dL (ref 70–99)
Potassium: 4 mmol/L (ref 3.5–5.1)
Sodium: 140 mmol/L (ref 135–145)

## 2022-03-02 LAB — I-STAT VENOUS BLOOD GAS, ED
Acid-Base Excess: 5 mmol/L — ABNORMAL HIGH (ref 0.0–2.0)
Bicarbonate: 32.4 mmol/L — ABNORMAL HIGH (ref 20.0–28.0)
Calcium, Ion: 1.2 mmol/L (ref 1.15–1.40)
HCT: 38 % (ref 36.0–46.0)
Hemoglobin: 12.9 g/dL (ref 12.0–15.0)
O2 Saturation: 87 %
Potassium: 4 mmol/L (ref 3.5–5.1)
Sodium: 141 mmol/L (ref 135–145)
TCO2: 34 mmol/L — ABNORMAL HIGH (ref 22–32)
pCO2, Ven: 57.1 mmHg (ref 44–60)
pH, Ven: 7.362 (ref 7.25–7.43)
pO2, Ven: 57 mmHg — ABNORMAL HIGH (ref 32–45)

## 2022-03-02 LAB — TROPONIN I (HIGH SENSITIVITY)
Troponin I (High Sensitivity): 5 ng/L (ref <18)
Troponin I (High Sensitivity): 4 ng/L (ref <18)

## 2022-03-02 LAB — CBG MONITORING, ED
Glucose-Capillary: 136 mg/dL — ABNORMAL HIGH (ref 70–99)
Glucose-Capillary: 69 mg/dL — ABNORMAL LOW (ref 70–99)

## 2022-03-02 MED ORDER — IPRATROPIUM-ALBUTEROL 0.5-2.5 (3) MG/3ML IN SOLN
3.0000 mL | Freq: Once | RESPIRATORY_TRACT | Status: AC
  Administered 2022-03-02: 3 mL via RESPIRATORY_TRACT
  Filled 2022-03-02: qty 3

## 2022-03-02 NOTE — ED Triage Notes (Signed)
Reports chest pain shortness that has been going on a week and was seen at [facility] and told she had fluid in her lungs and around her heart. Patient is typically on 2L [place] but has had to increased oxygen amount due to sats being low.  Patient swelling to lower legs.

## 2022-03-02 NOTE — ED Provider Notes (Signed)
[place] EMERGENCY DEPARTMENT Provider Note   CSN: [id number] Arrival date & time: 03/02/22  1548     History {Add pertinent medical, surgical, social history, OB history to HPI:1} Chief Complaint  Patient presents with  . Chest Pain  . Shortness of Breath    Heidi [name] is a 71 y.o. female with a hx of COPD, CHF w/ last EF of 65-70%, chronic PRN use of supplemental O2, diabetes mellitus, hypertension, hypercholesterolemia, & sleep apnea who presents to the ED with complaints of dyspnea x 3 weeks and chest pain x 3 days. Patient reports dyspnea primarily w/ exertion which has progressively worsened since her last ED visit 02/11/22, States at baseline she utilizes prn oxygen and typically only needs this if she is walking up a hill or significantly exerting herself, however now she is needing it to walk short distances throughout her house as she has been desaturating into the 70s on RA without it. She is also wearing it at rest. She notes associated intermittently productive cough and intermittent dizziness. Over the past few days she has developed central chest discomfort that radiates to the jaw & to the LUE, lasts for hours at a time, improved some with tylenol/aspirin, has nausea w/ this at times. Denies hemoptysis, recent surgery/trauma, recent long travel, hormone use, personal hx of cancer, or hx of DVT/PE.    HPI     Home Medications Prior to Admission medications   Medication Sig Start Date End Date Taking? Authorizing Provider  Accu-Chek FastClix Lancets MISC Use as directed to test blood sugar three times daily DX E11.65 07/09/21   Ladell Pier, MD  acetaminophen (TYLENOL) 500 MG tablet Take 500-1,000 mg by mouth every 6 (six) hours as needed (pain).    [provider]  albuterol (PROVENTIL) (2.5 MG/3ML) 0.083% nebulizer solution USE 1 VIAL VIA NEBULIZER EVERY 6 HOURS AS NEEDED FOR WHEEZING OR SHORTNESS OF BREATH 02/21/18   Ladell Pier, MD  albuterol (VENTOLIN HFA) 108 (90 Base) MCG/ACT inhaler Inhale 1-2 puffs into the lungs every 4 (four) hours as needed for wheezing or shortness of breath. 10/21/20   Spero Geralds, MD  aspirin 81 MG chewable tablet Chew 81 mg by mouth daily.    [provider]  atenolol (TENORMIN) 25 MG tablet TAKE 1 TABLET(25 MG) BY MOUTH DAILY 02/27/22   Ladell Pier, MD  atorvastatin (LIPITOR) 40 MG tablet TAKE 1 TABLET BY MOUTH DAILY AT 6 PM Patient taking differently: Take 40 mg by mouth every evening. 12/12/21   Ladell Pier, MD  Blood Glucose Monitoring Suppl (ACCU-CHEK GUIDE ME) w/Device KIT Use as directed to test blood sugar three times daily DX E11.65 07/09/21   Ladell Pier, MD  Botulinum Toxin Type A (BOTOX) 200 units SOLR Provider to inject 155 units into the muscles of the head and neck every 12 weeks. Discard remainder. 12/29/21   Melvenia Beam, MD  famotidine (PEPCID) 20 MG tablet Take 1 tablet (20 mg total) by mouth daily. 11/07/21   Ladell Pier, MD  fluticasone-salmeterol (ADVAIR DISKUS) 100-50 MCG/ACT AEPB Inhale 1 puff into the lungs 2 (two) times daily. 10/09/21   Margaretha Seeds, MD  furosemide (LASIX) 40 MG tablet TAKE 1 TABLET(40 MG) BY MOUTH DAILY Patient taking differently: Take 40 mg by mouth daily. 11/07/21   Ladell Pier, MD  glucose blood (ACCU-CHEK GUIDE) test strip Use as directed to test blood sugar three times daily DX  E11.65 07/09/21   Ladell Pier, MD  insulin lispro protamine-lispro (HUMALOG MIX 75/25) (75-25) 100 UNIT/ML SUSP injection Inject 80 units in the morning and 45-55 units in the evening. 01/01/22   Ladell Pier, MD  INSULIN SYRINGE 1CC/29G 29G X 1/2" 1 ML MISC USE THREE TIMES DAILY AS DIRECTED 12/22/21   Ladell Pier, MD  loratadine (CLARITIN) 10 MG tablet Take 1 tablet (10 mg total) by mouth daily as needed for allergies. 11/07/21   Ladell Pier, MD  losartan (COZAAR) 50 MG tablet TAKE 1 TABLET(50 MG)  BY MOUTH DAILY Patient taking differently: Take 50 mg by mouth daily. 12/19/21   Ladell Pier, MD  montelukast (SINGULAIR) 10 MG tablet Take 1 tablet (10 mg total) by mouth at bedtime. Patient taking differently: Take 10 mg by mouth at bedtime as needed (for allergies). 03/18/20   Tanda Rockers, MD  Multiple Vitamin (MULTIVITAMIN WITH MINERALS) TABS tablet Take 1 tablet by mouth in the morning. Centrum Silver    [provider]  nitroGLYCERIN (NITROSTAT) 0.3 MG SL tablet DISSOLVE 1 TABLET UNDER THE  TONGUE EVERY 5 MINUTES AS NEEDED FOR CHEST PAIN. MAX OF 3 TABLETS IN 15 MINUTES. CALL 911 IF PAIN  PERSISTS. Patient taking differently: Place 0.3 mg under the tongue every 5 (five) minutes as needed for chest pain. 01/15/22   Ladell Pier, MD  OXYGEN Inhale 2 L into the lungs continuous.    [provider]  Potassium Chloride ER 20 MEQ TBCR TAKE 1 TABLET BY MOUTH DAILY Patient taking differently: Take 20 mEq by mouth daily. 03/01/21   Ladell Pier, MD  Rimegepant Sulfate (NURTEC) 75 MG TBDP Take 75 mg by mouth daily as needed. For migraines. Take as close to onset of migraine as possible. One daily maximum. Patient not taking: Reported on 02/11/2022 02/03/22   Melvenia Beam, MD  SUPREP BOWEL PREP KIT 17.5-3.13-1.6 GM/177ML SOLN Take 1 kit by mouth as directed. For colonoscopy prep 03/05/21   Noralyn Pick, NP  tetrahydrozoline-zinc (VISINE-AC) 0.05-0.25 % ophthalmic solution Place 1 drop into both eyes 3 (three) times daily as needed (dry / irritated eyes).    [provider]  topiramate (TOPAMAX) 25 MG capsule Take 3 capsules (75 mg total) by mouth 2 (two) times daily. 02/03/22   Melvenia Beam, MD      Allergies    Januvia [sitagliptin] and Tramadol    Review of Systems   Review of Systems  Constitutional:  Negative for fever.  Respiratory:  Positive for cough and shortness of breath.   Cardiovascular:  Positive for chest pain.   Gastrointestinal:  Positive for nausea. Negative for blood in stool and vomiting.  Neurological:  Positive for light-headedness. Negative for syncope.  All other systems reviewed and are negative.   Physical Exam Updated Vital Signs BP 122/69   Pulse 74   Temp 98.6 F (37 C) (Oral)   Resp (!) 24   Ht 5' 4"  (1.626 m)   Wt 111.1 kg   SpO2 100%   BMI 42.05 kg/m  Physical Exam Vitals and nursing note reviewed.  Constitutional:      General: She is not in acute distress.    Appearance: She is well-developed. She is not toxic-appearing.  HENT:     Head: Normocephalic and atraumatic.  Eyes:     General:        Right eye: No discharge.        Left eye:  No discharge.     Conjunctiva/sclera: Conjunctivae normal.  Cardiovascular:     Rate and Rhythm: Normal rate and regular rhythm.  Pulmonary:     Effort: No respiratory distress.     Breath sounds: Normal breath sounds. No wheezing or rales.  Abdominal:     General: There is no distension.     Palpations: Abdomen is soft.     Tenderness: There is no abdominal tenderness.  Musculoskeletal:     Cervical back: Neck supple.  Skin:    General: Skin is warm and dry.  Neurological:     Mental Status: She is alert.     Comments: Clear speech.   Psychiatric:        Behavior: Behavior normal.     ED Results / Procedures / Treatments   Labs (all labs ordered are listed, but only abnormal results are displayed) Labs Reviewed  CBC - Abnormal; Notable for the following components:      Result Value   MCHC 29.6 (*)    All other components within normal limits  CBG MONITORING, ED - Abnormal; Notable for the following components:   Glucose-Capillary 69 (*)    All other components within normal limits  BASIC METABOLIC PANEL  TROPONIN I (HIGH SENSITIVITY)  TROPONIN I (HIGH SENSITIVITY)    EKG None  Radiology DG Chest 1 View  Result Date: 03/02/2022 CLINICAL DATA:  Chest pain, shortness of breath EXAM: CHEST  1 VIEW  COMPARISON:  Previous studies including the chest radiographs and CT done on 02/11/2022 FINDINGS: Transverse diameter of heart is increased. There is better inspiration. There is prominence of central pulmonary vessels without signs of alveolar pulmonary edema. There are small linear densities in the lower lung fields. There is improvement in the aeration of lower lung fields. There is no focal pulmonary consolidation. Lateral CP angles are clear. There is no pneumothorax. IMPRESSION: Cardiomegaly. There is interval decrease in pulmonary vascular congestion. Linear densities in the lower lung fields may suggest subsegmental atelectasis. There are no signs of alveolar pulmonary edema or focal pulmonary consolidation. Electronically Signed   By: Elmer Picker M.D.   On: 03/02/2022 18:42    Procedures Procedures  {Document cardiac monitor, telemetry assessment procedure when appropriate:1}  Medications Ordered in ED Medications - No data to display  ED Course/ Medical Decision Making/ A&P Clinical Course as of 03/02/22 2239  Mon Mar 02, 2022  2231 Stable 54 YOF with ccx of SOB. Hx of CHF/COPD. On 2L PRN. States that she has been needing increased O2 utilization. Was seen on 9th for similar and found to not have PE. However, now she desats if not on her O2 even at rest. Off her O2 even at rest, she desats to <88%  [CC]    Clinical Course User Index [CC] Tretha Sciara, MD                           Medical Decision Making  Patient presents to the ED with complaints of ***, this involves an extensive number of treatment options, and is a complaint that carries with it a high risk of complications and morbidity. Nontoxic, vitals ***.   Ddx including but not limited to: ***  Additional history obtained:  External records viewed including:  Patient SpO2 notes April 2022:   - Patient Saturations on Room Air at Rest = 95%  - Patient Saturations on [employer] while Ambulating = 88%  -  Patient  Saturations on 2 Liters of oxygen while Ambulating = 93%  CTA Pe study 02/11/22:  IMPRESSION: 1. No pulmonary embolism. 2. Moderate multi-vessel coronary artery calcification. Mild global cardiomegaly. Trace pericardial effusion. 3. Morphologic changes in keeping with pulmonary arterial hypertension.   EKG: ***  Lab Tests:  I viewed & interpreted labs including:  ***  Imaging Studies:  I oviewed the following imaging, agree with radiologist impression:  Cardiomegaly. There is interval decrease in pulmonary vascular congestion. Linear densities in the lower lung fields may suggest subsegmental atelectasis. There are no signs of alveolar pulmonary edema or focal pulmonary consolidation.   ED Course:  I ordered medications including *** for ***  ***: RE-EVAL: ***   Based on patient's chief complaint, I considered admission might be necessary, however after reassuring ED workup feel patient is reasonable for discharge.   Critical Interventions: ***  Social determinants: *** Social determinants of health: this may be used for patients who have homelessness, poor access to medical care, need assistance with transition of care for placement, medication or transportation or follow up. You may also use the section for patients who struggle with substance abuse, have dementia, nursing home patients, patients who are severely disabled or have some degree of mental retardation and are not able to fully participate in their care.   Portions of this note were generated with [employer]. Dictation errors may occur despite best attempts at proofreading.   {Document critical care time when appropriate:1} {Document review of labs and clinical decision tools ie heart score, Chads2Vasc2 etc:1}  {Document your independent review of radiology images, and any outside records:1} {Document your discussion with family members, caretakers, and with consultants:1} {Document social  determinants of health affecting pt's care:1} {Document your decision making why or why not admission, treatments were needed:1} Final Clinical Impression(s) / ED Diagnoses Final diagnoses:  None    Rx / DC Orders ED Discharge Orders     None

## 2022-03-02 NOTE — ED Provider Triage Note (Addendum)
Emergency Medicine Provider Triage Evaluation Note  Vasiliki Smaldone , a 71 y.o. female  was evaluated in triage.  Pt complains of concerns for chest pain onset 1 week. Notes shortness of breath as well. Also has a history of CHF, compliant with meds. Wears 2 L [place] continuously.   Review of Systems  Positive: As per HPI Negative:   Physical Exam  BP 135/64 (BP Location: Left Arm)   Pulse 80   Temp 98.6 F (37 C) (Oral)   Resp (!) 22   Ht '5\' 4"'$  (1.626 m)   Wt 111.1 kg   SpO2 92%   BMI 42.05 kg/m  Gen:   Awake, no distress   Resp:  Normal effort  MSK:   Moves extremities without difficulty  Other:  Trace edema noted to BLE.   Medical Decision Making  Medically screening exam initiated at 5:43 PM.  Appropriate orders placed.  Everette Dimauro was informed that the remainder of the evaluation will be completed by another provider, this initial triage assessment does not replace that evaluation, and the importance of remaining in the ED until their evaluation is complete.  Work-up initiated.    Fredderick Swanger A, PA-C 03/02/22 1751  7:28 PM - Notified that CBG at 69, instructed tech to provide patient with food/juice.    Tyrene Nader A, PA-C 03/02/22 1928

## 2022-03-03 ENCOUNTER — Inpatient Hospital Stay (HOSPITAL_COMMUNITY): Payer: Medicare Other

## 2022-03-03 DIAGNOSIS — R079 Chest pain, unspecified: Secondary | ICD-10-CM

## 2022-03-03 DIAGNOSIS — J9611 Chronic respiratory failure with hypoxia: Secondary | ICD-10-CM

## 2022-03-03 DIAGNOSIS — Z794 Long term (current) use of insulin: Secondary | ICD-10-CM | POA: Diagnosis not present

## 2022-03-03 DIAGNOSIS — J4541 Moderate persistent asthma with (acute) exacerbation: Secondary | ICD-10-CM | POA: Diagnosis present

## 2022-03-03 DIAGNOSIS — E11319 Type 2 diabetes mellitus with unspecified diabetic retinopathy without macular edema: Secondary | ICD-10-CM | POA: Diagnosis present

## 2022-03-03 DIAGNOSIS — I200 Unstable angina: Secondary | ICD-10-CM | POA: Diagnosis present

## 2022-03-03 DIAGNOSIS — N182 Chronic kidney disease, stage 2 (mild): Secondary | ICD-10-CM | POA: Diagnosis present

## 2022-03-03 DIAGNOSIS — E1122 Type 2 diabetes mellitus with diabetic chronic kidney disease: Secondary | ICD-10-CM | POA: Diagnosis present

## 2022-03-03 DIAGNOSIS — E785 Hyperlipidemia, unspecified: Secondary | ICD-10-CM

## 2022-03-03 DIAGNOSIS — I509 Heart failure, unspecified: Secondary | ICD-10-CM

## 2022-03-03 DIAGNOSIS — Z833 Family history of diabetes mellitus: Secondary | ICD-10-CM | POA: Diagnosis not present

## 2022-03-03 DIAGNOSIS — I2721 Secondary pulmonary arterial hypertension: Secondary | ICD-10-CM | POA: Diagnosis present

## 2022-03-03 DIAGNOSIS — I5032 Chronic diastolic (congestive) heart failure: Secondary | ICD-10-CM | POA: Diagnosis not present

## 2022-03-03 DIAGNOSIS — Z90710 Acquired absence of both cervix and uterus: Secondary | ICD-10-CM | POA: Diagnosis not present

## 2022-03-03 DIAGNOSIS — G43909 Migraine, unspecified, not intractable, without status migrainosus: Secondary | ICD-10-CM | POA: Diagnosis not present

## 2022-03-03 DIAGNOSIS — Z800 Family history of malignant neoplasm of digestive organs: Secondary | ICD-10-CM | POA: Diagnosis not present

## 2022-03-03 DIAGNOSIS — G4733 Obstructive sleep apnea (adult) (pediatric): Secondary | ICD-10-CM | POA: Diagnosis present

## 2022-03-03 DIAGNOSIS — E1165 Type 2 diabetes mellitus with hyperglycemia: Secondary | ICD-10-CM | POA: Diagnosis present

## 2022-03-03 DIAGNOSIS — R0602 Shortness of breath: Secondary | ICD-10-CM | POA: Diagnosis present

## 2022-03-03 DIAGNOSIS — I130 Hypertensive heart and chronic kidney disease with heart failure and stage 1 through stage 4 chronic kidney disease, or unspecified chronic kidney disease: Secondary | ICD-10-CM | POA: Diagnosis present

## 2022-03-03 DIAGNOSIS — Z9981 Dependence on supplemental oxygen: Secondary | ICD-10-CM | POA: Diagnosis not present

## 2022-03-03 DIAGNOSIS — I5033 Acute on chronic diastolic (congestive) heart failure: Secondary | ICD-10-CM | POA: Diagnosis present

## 2022-03-03 DIAGNOSIS — Z79899 Other long term (current) drug therapy: Secondary | ICD-10-CM | POA: Diagnosis not present

## 2022-03-03 DIAGNOSIS — Z6841 Body Mass Index (BMI) 40.0 and over, adult: Secondary | ICD-10-CM | POA: Diagnosis not present

## 2022-03-03 DIAGNOSIS — J4540 Moderate persistent asthma, uncomplicated: Secondary | ICD-10-CM | POA: Diagnosis present

## 2022-03-03 DIAGNOSIS — E7800 Pure hypercholesterolemia, unspecified: Secondary | ICD-10-CM | POA: Diagnosis present

## 2022-03-03 DIAGNOSIS — I480 Paroxysmal atrial fibrillation: Secondary | ICD-10-CM | POA: Diagnosis not present

## 2022-03-03 DIAGNOSIS — J449 Chronic obstructive pulmonary disease, unspecified: Secondary | ICD-10-CM | POA: Diagnosis present

## 2022-03-03 LAB — CBC WITH DIFFERENTIAL/PLATELET
Abs Immature Granulocytes: 0.04 10*3/uL (ref 0.00–0.07)
Basophils Absolute: 0 10*3/uL (ref 0.0–0.1)
Basophils Relative: 1 %
Eosinophils Absolute: 0.2 10*3/uL (ref 0.0–0.5)
Eosinophils Relative: 2 %
HCT: 42.1 % (ref 36.0–46.0)
Hemoglobin: 12.8 g/dL (ref 12.0–15.0)
Immature Granulocytes: 1 %
Lymphocytes Relative: 21 %
Lymphs Abs: 1.7 10*3/uL (ref 0.7–4.0)
MCH: 27.7 pg (ref 26.0–34.0)
MCHC: 30.4 g/dL (ref 30.0–36.0)
MCV: 91.1 fL (ref 80.0–100.0)
Monocytes Absolute: 0.3 10*3/uL (ref 0.1–1.0)
Monocytes Relative: 3 %
Neutro Abs: 5.6 10*3/uL (ref 1.7–7.7)
Neutrophils Relative %: 72 %
Platelets: 208 10*3/uL (ref 150–400)
RBC: 4.62 MIL/uL (ref 3.87–5.11)
RDW: 15 % (ref 11.5–15.5)
WBC: 7.7 10*3/uL (ref 4.0–10.5)
nRBC: 0 % (ref 0.0–0.2)

## 2022-03-03 LAB — ECHOCARDIOGRAM COMPLETE
AR max vel: 2.87 cm2
AV Area VTI: 2.91 cm2
AV Area mean vel: 2.78 cm2
AV Mean grad: 2 mmHg
AV Peak grad: 4.1 mmHg
Ao pk vel: 1.01 m/s
Height: 64 in
S' Lateral: 2.4 cm
Weight: 3920 oz

## 2022-03-03 LAB — COMPREHENSIVE METABOLIC PANEL
ALT: 20 U/L (ref 0–44)
AST: 20 U/L (ref 15–41)
Albumin: 3.7 g/dL (ref 3.5–5.0)
Alkaline Phosphatase: 99 U/L (ref 38–126)
Anion gap: 10 (ref 5–15)
BUN: 10 mg/dL (ref 8–23)
CO2: 30 mmol/L (ref 22–32)
Calcium: 9.3 mg/dL (ref 8.9–10.3)
Chloride: 100 mmol/L (ref 98–111)
Creatinine, Ser: 0.96 mg/dL (ref 0.44–1.00)
GFR, Estimated: >60 mL/min (ref >60)
Glucose, Bld: 181 mg/dL — ABNORMAL HIGH (ref 70–99)
Potassium: 4 mmol/L (ref 3.5–5.1)
Sodium: 140 mmol/L (ref 135–145)
Total Bilirubin: 0.5 mg/dL (ref 0.3–1.2)
Total Protein: 6.6 g/dL (ref 6.5–8.1)

## 2022-03-03 LAB — GLUCOSE, CAPILLARY: Glucose-Capillary: 442 mg/dL — ABNORMAL HIGH (ref 70–99)

## 2022-03-03 LAB — MAGNESIUM: Magnesium: 1.9 mg/dL (ref 1.7–2.4)

## 2022-03-03 LAB — PHOSPHORUS: Phosphorus: 3.5 mg/dL (ref 2.5–4.6)

## 2022-03-03 LAB — CBG MONITORING, ED
Glucose-Capillary: 446 mg/dL — ABNORMAL HIGH (ref 70–99)
Glucose-Capillary: 444 mg/dL — ABNORMAL HIGH (ref 70–99)
Glucose-Capillary: 436 mg/dL — ABNORMAL HIGH (ref 70–99)

## 2022-03-03 LAB — HEMOGLOBIN A1C
Hgb A1c MFr Bld: 9.8 % — ABNORMAL HIGH (ref 4.8–5.6)
Mean Plasma Glucose: 234.56 mg/dL

## 2022-03-03 LAB — PROCALCITONIN: Procalcitonin: <0.1 ng/mL

## 2022-03-03 LAB — BRAIN NATRIURETIC PEPTIDE: B Natriuretic Peptide: 12.4 pg/mL (ref 0.0–100.0)

## 2022-03-03 MED ORDER — LOSARTAN POTASSIUM 50 MG PO TABS
50.0000 mg | ORAL_TABLET | Freq: Every day | ORAL | Status: DC
  Administered 2022-03-03 – 2022-03-05 (×3): 50 mg via ORAL
  Filled 2022-03-03 (×3): qty 1

## 2022-03-03 MED ORDER — ACETAMINOPHEN 650 MG RE SUPP: 650.0000 mg | Freq: Four times a day (QID) | RECTAL | Status: DC | PRN

## 2022-03-03 MED ORDER — PREDNISONE 20 MG PO TABS
40.0000 mg | ORAL_TABLET | Freq: Every day | ORAL | Status: DC
  Administered 2022-03-04: 40 mg via ORAL
  Filled 2022-03-03: qty 2

## 2022-03-03 MED ORDER — METHOCARBAMOL 500 MG PO TABS
500.0000 mg | ORAL_TABLET | Freq: Once | ORAL | Status: AC
Start: 2022-03-03 — End: 2022-03-03
  Administered 2022-03-03: 500 mg via ORAL
  Filled 2022-03-03: qty 1

## 2022-03-03 MED ORDER — METHYLPREDNISOLONE SODIUM SUCC 125 MG IJ SOLR
125.0000 mg | Freq: Once | INTRAMUSCULAR | Status: AC
  Administered 2022-03-03: 125 mg via INTRAVENOUS
  Filled 2022-03-03: qty 2

## 2022-03-03 MED ORDER — METHYLPREDNISOLONE SODIUM SUCC 125 MG IJ SOLR: 80.0000 mg | Freq: Two times a day (BID) | INTRAMUSCULAR | Status: DC

## 2022-03-03 MED ORDER — IPRATROPIUM-ALBUTEROL 0.5-2.5 (3) MG/3ML IN SOLN
3.0000 mL | Freq: Once | RESPIRATORY_TRACT | Status: AC
  Administered 2022-03-03: 3 mL via RESPIRATORY_TRACT
  Filled 2022-03-03: qty 3

## 2022-03-03 MED ORDER — FUROSEMIDE 10 MG/ML IJ SOLN
40.0000 mg | Freq: Once | INTRAMUSCULAR | Status: AC
  Administered 2022-03-03: 40 mg via INTRAVENOUS
  Filled 2022-03-03: qty 4

## 2022-03-03 MED ORDER — GUAIFENESIN ER 600 MG PO TB12
600.0000 mg | ORAL_TABLET | Freq: Two times a day (BID) | ORAL | Status: DC
  Administered 2022-03-03 – 2022-03-07 (×8): 600 mg via ORAL
  Filled 2022-03-03 (×9): qty 1

## 2022-03-03 MED ORDER — METHYLPREDNISOLONE SODIUM SUCC 125 MG IJ SOLR
80.0000 mg | Freq: Two times a day (BID) | INTRAMUSCULAR | Status: AC
  Administered 2022-03-03: 80 mg via INTRAVENOUS
  Filled 2022-03-03: qty 2

## 2022-03-03 MED ORDER — SODIUM CHLORIDE 0.9% FLUSH
3.0000 mL | Freq: Two times a day (BID) | INTRAVENOUS | Status: DC
  Administered 2022-03-07: 3 mL via INTRAVENOUS

## 2022-03-03 MED ORDER — ACETAMINOPHEN 325 MG PO TABS
650.0000 mg | ORAL_TABLET | Freq: Four times a day (QID) | ORAL | Status: DC | PRN
  Filled 2022-03-03: qty 2

## 2022-03-03 MED ORDER — METHYLPREDNISOLONE SODIUM SUCC 125 MG IJ SOLR: 80.0000 mg | Freq: Four times a day (QID) | INTRAMUSCULAR | Status: DC

## 2022-03-03 MED ORDER — ENOXAPARIN SODIUM 40 MG/0.4ML IJ SOSY
40.0000 mg | PREFILLED_SYRINGE | INTRAMUSCULAR | Status: DC
Start: 2022-03-03 — End: 2022-03-06
  Administered 2022-03-03 – 2022-03-05 (×2): 40 mg via SUBCUTANEOUS
  Filled 2022-03-03 (×2): qty 0.4

## 2022-03-03 MED ORDER — SODIUM CHLORIDE 0.9% FLUSH
3.0000 mL | Freq: Two times a day (BID) | INTRAVENOUS | Status: DC
  Administered 2022-03-03 – 2022-03-06 (×5): 3 mL via INTRAVENOUS

## 2022-03-03 MED ORDER — ACETAMINOPHEN 325 MG PO TABS: 650.0000 mg | ORAL_TABLET | Freq: Four times a day (QID) | ORAL | Status: DC | PRN

## 2022-03-03 MED ORDER — HYDROCODONE-ACETAMINOPHEN 5-325 MG PO TABS
1.0000 | ORAL_TABLET | Freq: Once | ORAL | Status: AC | PRN
  Administered 2022-03-03: 1 via ORAL
  Filled 2022-03-03: qty 1

## 2022-03-03 MED ORDER — LORATADINE 10 MG PO TABS: 10.0000 mg | ORAL_TABLET | Freq: Every day | ORAL | Status: DC | PRN

## 2022-03-03 MED ORDER — TOPIRAMATE 25 MG PO TABS
75.0000 mg | ORAL_TABLET | Freq: Two times a day (BID) | ORAL | Status: DC
  Administered 2022-03-03 – 2022-03-07 (×8): 75 mg via ORAL
  Filled 2022-03-03 (×11): qty 3

## 2022-03-03 MED ORDER — INSULIN ASPART PROT & ASPART (70-30 MIX) 100 UNIT/ML ~~LOC~~ SUSP: 80.0000 [IU] | Freq: Every day | SUBCUTANEOUS | Status: DC

## 2022-03-03 MED ORDER — INSULIN ASPART PROT & ASPART (70-30 MIX) 100 UNIT/ML ~~LOC~~ SUSP
80.0000 [IU] | Freq: Every day | SUBCUTANEOUS | Status: DC
  Administered 2022-03-03: 80 [IU] via SUBCUTANEOUS
  Filled 2022-03-03 (×2): qty 10

## 2022-03-03 MED ORDER — MONTELUKAST SODIUM 10 MG PO TABS
10.0000 mg | ORAL_TABLET | Freq: Every day | ORAL | Status: DC
  Administered 2022-03-03 – 2022-03-06 (×4): 10 mg via ORAL
  Filled 2022-03-03 (×4): qty 1

## 2022-03-03 MED ORDER — ALBUTEROL SULFATE (2.5 MG/3ML) 0.083% IN NEBU
2.5000 mg | INHALATION_SOLUTION | Freq: Three times a day (TID) | RESPIRATORY_TRACT | Status: DC
  Administered 2022-03-04 – 2022-03-05 (×3): 2.5 mg via RESPIRATORY_TRACT
  Filled 2022-03-03 (×4): qty 3

## 2022-03-03 MED ORDER — INSULIN ASPART 100 UNIT/ML IJ SOLN
10.0000 [IU] | Freq: Once | INTRAMUSCULAR | Status: AC
  Administered 2022-03-03: 10 [IU] via SUBCUTANEOUS

## 2022-03-03 MED ORDER — MOMETASONE FURO-FORMOTEROL FUM 100-5 MCG/ACT IN AERO
2.0000 | INHALATION_SPRAY | Freq: Two times a day (BID) | RESPIRATORY_TRACT | Status: DC
  Administered 2022-03-05 – 2022-03-07 (×5): 2 via RESPIRATORY_TRACT
  Filled 2022-03-03 (×3): qty 8.8

## 2022-03-03 MED ORDER — NAPHAZOLINE-GLYCERIN 0.012-0.2 % OP SOLN: 1.0000 [drp] | Freq: Four times a day (QID) | OPHTHALMIC | Status: DC | PRN

## 2022-03-03 MED ORDER — IPRATROPIUM-ALBUTEROL 0.5-2.5 (3) MG/3ML IN SOLN
3.0000 mL | Freq: Four times a day (QID) | RESPIRATORY_TRACT | Status: DC
  Filled 2022-03-03: qty 3

## 2022-03-03 MED ORDER — ALBUTEROL SULFATE (2.5 MG/3ML) 0.083% IN NEBU
2.5000 mg | INHALATION_SOLUTION | Freq: Four times a day (QID) | RESPIRATORY_TRACT | Status: DC
  Administered 2022-03-03 (×3): 2.5 mg via RESPIRATORY_TRACT
  Filled 2022-03-03 (×3): qty 3

## 2022-03-03 MED ORDER — INSULIN ASPART PROT & ASPART (70-30 MIX) 100 UNIT/ML ~~LOC~~ SUSP: 40.0000 [IU] | Freq: Every day | SUBCUTANEOUS | Status: DC

## 2022-03-03 MED ORDER — INSULIN ASPART 100 UNIT/ML IJ SOLN
0.0000 [IU] | Freq: Three times a day (TID) | INTRAMUSCULAR | Status: DC
  Administered 2022-03-03 – 2022-03-04 (×2): 9 [IU] via SUBCUTANEOUS
  Administered 2022-03-04: 5 [IU] via SUBCUTANEOUS
  Administered 2022-03-04: 7 [IU] via SUBCUTANEOUS

## 2022-03-03 MED ORDER — RIMEGEPANT SULFATE 75 MG PO TBDP: 75.0000 mg | ORAL_TABLET | ORAL | Status: DC

## 2022-03-03 MED ORDER — INSULIN ASPART 100 UNIT/ML IJ SOLN: 0.0000 [IU] | Freq: Three times a day (TID) | INTRAMUSCULAR | Status: DC

## 2022-03-03 MED ORDER — ATENOLOL 25 MG PO TABS
25.0000 mg | ORAL_TABLET | Freq: Every day | ORAL | Status: DC
  Administered 2022-03-03 – 2022-03-07 (×4): 25 mg via ORAL
  Filled 2022-03-03 (×3): qty 1
  Filled 2022-03-03: qty 0.5
  Filled 2022-03-03: qty 1

## 2022-03-03 MED ORDER — ALBUTEROL SULFATE (2.5 MG/3ML) 0.083% IN NEBU: 2.5000 mg | INHALATION_SOLUTION | RESPIRATORY_TRACT | Status: DC | PRN

## 2022-03-03 MED ORDER — INSULIN ASPART PROT & ASPART (70-30 MIX) 100 UNIT/ML ~~LOC~~ SUSP
40.0000 [IU] | Freq: Every day | SUBCUTANEOUS | Status: DC
  Filled 2022-03-03: qty 10

## 2022-03-03 MED ORDER — ATORVASTATIN CALCIUM 40 MG PO TABS
40.0000 mg | ORAL_TABLET | Freq: Every evening | ORAL | Status: DC
  Administered 2022-03-03 – 2022-03-06 (×4): 40 mg via ORAL
  Filled 2022-03-03 (×4): qty 1

## 2022-03-03 NOTE — ED Notes (Signed)
Patient was taken for a walk about 50 ft from room and then back to room. O2 stat started at 98, when walking patient was with O2 tank and dropped down to 86. Place patient back in bed in room.

## 2022-03-03 NOTE — Progress Notes (Signed)
  Echocardiogram 2D Echocardiogram has been performed.  Heidi [name] 03/03/2022, 11:54 AM

## 2022-03-03 NOTE — Consult Note (Signed)
CARDIOLOGY CONSULT NOTE  Patient ID: Arionne Iams MRN: [id number] DOB/AGE: 1950-08-20 71 y.o.  Admit date: 03/02/2022 Referring Physician: [facility] hospitalist Reason for Consultation:  Chest pain  HPI:   71 y.o. African American female with hypertension, type 2 DM with retinopathy, COPD, hyperlipidemia, obesity, OSA, h/o breast nodule, chronic migraines, presented with chest pain, shortness of breath.  I last saw the patient in 2021. Workup at that time, including stress test, was negative. Recently, she has had recurrent chest pain and shortness of breath with minimal activity, requiring and improving with up to 3 NTG a day. She has had recurrent ER visits with similar complaints. CTA showed no PE. It did show vascular congestion. Echocardiogram today showed no significant abnormality. Of note, CT chest showed multivessel coronary calcification.   Past Medical History:  Diagnosis Date   Adenomatous colon polyp    Anemia    Anxiety    Asthma    CHF (congestive heart failure) (HCC)    COPD (chronic obstructive pulmonary disease) ([place])    Diabetes mellitus without complication ([place])    High cholesterol    Hypertension    Left knee injury    Pneumonia    Sleep apnea    Supplemental oxygen dependent    2L [place]     Past Surgical History:  Procedure Laterality Date   ABDOMINAL HYSTERECTOMY     CATARACT EXTRACTION Left    CATARACT EXTRACTION     LT 01/2017, 02/2017 on RT   CESAREAN SECTION     COLONOSCOPY WITH PROPOFOL N/A 04/15/2021   Procedure: COLONOSCOPY WITH PROPOFOL;  Surgeon: Irene Shipper, MD;  Location: [facility];  Service: Endoscopy;  Laterality: N/A;  patient is on O2   JOINT REPLACEMENT Left    Plate in Ankle   POLYPECTOMY  04/15/2021   Procedure: POLYPECTOMY;  Surgeon: Irene Shipper, MD;  Location: [facility];  Service: Endoscopy;;      Family History  Problem Relation Age of Onset   Colon cancer Father    Diabetes type II Sister    Diabetes type II  Brother    Colon cancer Sister    Migraines Neg Hx    Breast cancer Neg Hx      Social History: Social History   Socioeconomic History   Marital status: Widowed    Spouse name: Not on file   Number of children: 4   Years of education: Not on file   Highest education level: Not on file  Occupational History   Occupation: Disabled  Tobacco Use   Smoking status: Never    Passive exposure: Never   Smokeless tobacco: Never  Vaping Use   Vaping Use: Never used  Substance and Sexual Activity   Alcohol use: No   Drug use: No   Sexual activity: Yes  Other Topics Concern   Not on file  Social History Narrative   Lives with daughter   Caffeine use: 1 cup coffee per day   Social Determinants of Health   Financial Resource Strain: Not on file  Food Insecurity: Not on file  Transportation Needs: Not on file  Physical Activity: Not on file  Stress: Not on file  Social Connections: Not on file  Intimate Partner Violence: Not on file     (Not in a hospital admission)   Review of Systems  Cardiovascular:  Positive for chest pain and dyspnea on exertion. Negative for leg swelling, palpitations and syncope.      Physical Exam: Physical  Exam Vitals and nursing note reviewed.  Constitutional:      General: She is not in acute distress.    Appearance: She is obese.  Neck:     Vascular: No JVD.  Cardiovascular:     Rate and Rhythm: Normal rate and regular rhythm.     Heart sounds: Normal heart sounds. No murmur heard. Pulmonary:     Effort: Pulmonary effort is normal.     Breath sounds: Normal breath sounds. No wheezing or rales.  Musculoskeletal:     Right lower leg: No edema.     Left lower leg: No edema.        Imaging/tests reviewed and independently interpreted: Lab Results: CBC, BMP, BNP, trop HS  Cardiac Studies:  EKG 03/02/2022: Sinus rhythm Love voltage Poor R wave progression  CTA chest 02/11/2022: 1. No pulmonary embolism. 2. Moderate  multi-vessel coronary artery calcification. Mild global cardiomegaly. Trace pericardial effusion. 3. Morphologic changes in keeping with pulmonary arterial hypertension.   Echocardiogram 08/25/2019: 1. Left ventricular ejection fraction, by estimation, is 65 to 70%. The left ventricle has normal function.  LV endocardial border not optimally defined to evaluate regional wall motion. There is mild concentric left ventricular hypertrophy.  Indeterminate diastolic filling due to E-A fusion.  2. Right ventricular systolic function is normal. The right ventricular size is mildly enlarged.  Tricuspid regurgitation signal is inadequate for assessing PA pressure.  3. The mitral valve is grossly normal. No evidence of mitral valve regurgitation. No evidence of mitral stenosis.  4. The aortic valve is grossly normal. Aortic valve regurgitation is not visualized. No aortic stenosis is present.   Lexiscan Tetrofosmin stress test 12/09/2019: Lexiscan nuclear stress test performed using 1-day protocol. Stress EKG is non-diagnostic, as this is pharmacological stress test using Lexiscan. Myocardial perfusion imaging is normal. Stress LVEF 74% Low risk study.    Assessment & Recommendations:  71 y.o. African American female with hypertension, type 2 DM with retinopathy, COPD, hyperlipidemia, obesity, OSA, h/o breast nodule, chronic migraines, presented with chest pain, shortness of breath.  Chest pain: Nitrate responsive pain, without EKG or troponin changes. CT chest showed multivessel coronary calcification. Stress test in 2021 for similar complains did not show ischemia. Given her recurrent chest pain presentation, multiple risk factors-including uncontrolled type 2 DM, coronary anatomy evaluationis warranted., Given her obesity and calcification, CCTA wouldnot be ideal. Therefore, recommend invasive coronary angiography and possible intervention. Risks, benefits, alternate options discussed with the  patient in detail. Patient would like tpo prpoceed.  Exertional dyspnea: Recommend echocardiogram. Likely multifactorial- obesity, COPD, ?CAD, high suspicion for OHS.   Discussed interpretation of tests and management recommendations with the primary team     Nigel Mormon, MD Pager: [phone] Office: [phone]

## 2022-03-03 NOTE — H&P (View-Only) (Signed)
CARDIOLOGY CONSULT NOTE  Patient ID: Heidi [name] MRN: [id number] DOB/AGE: 1951-06-19 71 y.o.  Admit date: 03/02/2022 Referring Physician: [facility] hospitalist Reason for Consultation:  Chest pain  HPI:   71 y.o. African American female with hypertension, type 2 DM with retinopathy, COPD, hyperlipidemia, obesity, OSA, h/o breast nodule, chronic migraines, presented with chest pain, shortness of breath.  I last saw the patient in 2021. Workup at that time, including stress test, was negative. Recently, she has had recurrent chest pain and shortness of breath with minimal activity, requiring and improving with up to 3 NTG a day. She has had recurrent ER visits with similar complaints. CTA showed no PE. It did show vascular congestion. Echocardiogram today showed no significant abnormality. Of note, CT chest showed multivessel coronary calcification.   Past Medical History:  Diagnosis Date   Adenomatous colon polyp    Anemia    Anxiety    Asthma    CHF (congestive heart failure) (HCC)    COPD (chronic obstructive pulmonary disease) ([place])    Diabetes mellitus without complication ([place])    High cholesterol    Hypertension    Left knee injury    Pneumonia    Sleep apnea    Supplemental oxygen dependent    2L [place]     Past Surgical History:  Procedure Laterality Date   ABDOMINAL HYSTERECTOMY     CATARACT EXTRACTION Left    CATARACT EXTRACTION     LT 01/2017, 02/2017 on RT   CESAREAN SECTION     COLONOSCOPY WITH PROPOFOL N/A 04/15/2021   Procedure: COLONOSCOPY WITH PROPOFOL;  Surgeon: Irene Shipper, MD;  Location: [facility];  Service: Endoscopy;  Laterality: N/A;  patient is on O2   JOINT REPLACEMENT Left    Plate in Ankle   POLYPECTOMY  04/15/2021   Procedure: POLYPECTOMY;  Surgeon: Irene Shipper, MD;  Location: [facility];  Service: Endoscopy;;      Family History  Problem Relation Age of Onset   Colon cancer Father    Diabetes type II Sister    Diabetes type II  Brother    Colon cancer Sister    Migraines Neg Hx    Breast cancer Neg Hx      Social History: Social History   Socioeconomic History   Marital status: Widowed    Spouse name: Not on file   Number of children: 4   Years of education: Not on file   Highest education level: Not on file  Occupational History   Occupation: Disabled  Tobacco Use   Smoking status: Never    Passive exposure: Never   Smokeless tobacco: Never  Vaping Use   Vaping Use: Never used  Substance and Sexual Activity   Alcohol use: No   Drug use: No   Sexual activity: Yes  Other Topics Concern   Not on file  Social History Narrative   Lives with daughter   Caffeine use: 1 cup coffee per day   Social Determinants of Health   Financial Resource Strain: Not on file  Food Insecurity: Not on file  Transportation Needs: Not on file  Physical Activity: Not on file  Stress: Not on file  Social Connections: Not on file  Intimate Partner Violence: Not on file     (Not in a hospital admission)   Review of Systems  Cardiovascular:  Positive for chest pain and dyspnea on exertion. Negative for leg swelling, palpitations and syncope.      Physical Exam: Physical  Exam Vitals and nursing note reviewed.  Constitutional:      General: She is not in acute distress.    Appearance: She is obese.  Neck:     Vascular: No JVD.  Cardiovascular:     Rate and Rhythm: Normal rate and regular rhythm.     Heart sounds: Normal heart sounds. No murmur heard. Pulmonary:     Effort: Pulmonary effort is normal.     Breath sounds: Normal breath sounds. No wheezing or rales.  Musculoskeletal:     Right lower leg: No edema.     Left lower leg: No edema.        Imaging/tests reviewed and independently interpreted: Lab Results: CBC, BMP, BNP, trop HS  Cardiac Studies:  EKG 03/02/2022: Sinus rhythm Love voltage Poor R wave progression  CTA chest 02/11/2022: 1. No pulmonary embolism. 2. Moderate  multi-vessel coronary artery calcification. Mild global cardiomegaly. Trace pericardial effusion. 3. Morphologic changes in keeping with pulmonary arterial hypertension.   Echocardiogram 08/25/2019: 1. Left ventricular ejection fraction, by estimation, is 65 to 70%. The left ventricle has normal function.  LV endocardial border not optimally defined to evaluate regional wall motion. There is mild concentric left ventricular hypertrophy.  Indeterminate diastolic filling due to E-A fusion.  2. Right ventricular systolic function is normal. The right ventricular size is mildly enlarged.  Tricuspid regurgitation signal is inadequate for assessing PA pressure.  3. The mitral valve is grossly normal. No evidence of mitral valve regurgitation. No evidence of mitral stenosis.  4. The aortic valve is grossly normal. Aortic valve regurgitation is not visualized. No aortic stenosis is present.   Lexiscan Tetrofosmin stress test 12/09/2019: Lexiscan nuclear stress test performed using 1-day protocol. Stress EKG is non-diagnostic, as this is pharmacological stress test using Lexiscan. Myocardial perfusion imaging is normal. Stress LVEF 74% Low risk study.    Assessment & Recommendations:  71 y.o. African American female with hypertension, type 2 DM with retinopathy, COPD, hyperlipidemia, obesity, OSA, h/o breast nodule, chronic migraines, presented with chest pain, shortness of breath.  Chest pain: Nitrate responsive pain, without EKG or troponin changes. CT chest showed multivessel coronary calcification. Stress test in 2021 for similar complains did not show ischemia. Given her recurrent chest pain presentation, multiple risk factors-including uncontrolled type 2 DM, coronary anatomy evaluationis warranted., Given her obesity and calcification, CCTA wouldnot be ideal. Therefore, recommend invasive coronary angiography and possible intervention. Risks, benefits, alternate options discussed with the  patient in detail. Patient would like tpo prpoceed.  Exertional dyspnea: Recommend echocardiogram. Likely multifactorial- obesity, COPD, ?CAD, high suspicion for OHS.   Discussed interpretation of tests and management recommendations with the primary team     Nigel Mormon, MD Pager: [phone] Office: [phone]

## 2022-03-03 NOTE — Progress Notes (Signed)
  Carryover admission to the Day Admitter.  I discussed this case with the EDP, Kennith Maes, PA.  Per these discussions:   This is a 71 year old female with history of COPD, chronic diastolic heart failure, who is being admitted with acute on chronic hypoxic respiratory failure, suspected to be multifactorial with contributions from acute COPD exacerbation as well as acute on chronic diastolic heart failure after presenting with 2 to 3 weeks of progressive shortness of breath.   Patient conveys that she uses supplemental oxygen at home only on a as needed basis, and notes that at baseline, she does not require any supplemental oxygen at rest nor with mild exertion.  However, over the last week has noted her oxygen saturations at home, while at rest, to decrease into the mid 80s.  She has subsequently started using supplemental oxygen continuously at 2 L/min in order to maintain oxygen saturations in the 90s.   She was originally seen in the emergency department on 02/11/2022 for shortness of breath, at which time CTA chest showed pleural effusion and no evidence of acute pulmonary embolism.  She was subsequent discharged home from the ED.   However, since that ED visit, she notes further progression of her shortness of breath along with interval development of continuous supplemental oxygen requirements, as above, prompting her to present back to [facility] emergency department this evening for further evaluation.  With ambulation, she is noted to desat into the 70s on room air.  Over the last 2 3 days, she is also noted new onset left-sided chest pain with radiation into the arm, that has been associated with some nausea and diaphoresis, but is also reportedly reproducible with palpation over the left anterior chest wall also demonstrating a pleuritic element.  Given recent CTA chest, repeat CTA was not performed in the ED today.  Chest x-ray reportedly shows interval increase in vascular congestion.    She has received 2 nebulizer treatment, albuterol nebulizer, and solumedrol in addition to IV Lasix.   I have placed an order for inpatient admission to med telemetry for the above.   I have placed some additional preliminary admit orders via the adult multi-morbid admission order set. I have also ordered scheduled duo nebulizer treatments, as needed albuterol, solumedrol.  Will defer orders for additional IV Lasix to the admitting physician.  I also placed order for morning labs, including serum magnesium and phosphorus levels and added on a procalcitonin level.    Babs Bertin, DO Hospitalist

## 2022-03-03 NOTE — ED Notes (Signed)
ED TO INPATIENT HANDOFF REPORT  ED Nurse Name and Phone #: Iona Coach Name/Age/Gender Heidi [name] 71 y.o. female Room/Bed: 045C/045C  Code Status   Code Status: Full Code  Home/SNF/Other Home Patient oriented to: self, place, time, and situation Is this baseline? Yes   Triage Complete: Triage complete  Chief Complaint Acute on chronic respiratory failure with hypoxia [facility]) [J96.21]  Triage Note Reports chest pain shortness that has been going on a week and was seen at [facility] and told she had fluid in her lungs and around her heart. Patient is typically on 2L [place] but has had to increased oxygen amount due to sats being low.  Patient swelling to lower legs.    Allergies Allergies  Allergen Reactions   Januvia [Sitagliptin]     Chest pains   Tramadol Other (See Comments)    hallucinations    Level of Care/Admitting Diagnosis ED Disposition     ED Disposition  Admit   Condition  --   Comment  Hospital Area: [place] [[id number]]  Level of Care: Telemetry Medical [104]  May admit patient to [facility] or [facility] if equivalent level of care is available:: No  Covid Evaluation: Asymptomatic - no recent exposure (last 10 days) testing not required  Diagnosis: Acute on chronic respiratory failure with hypoxia [facility]) [[id number]]  Admitting Physician: Rhetta Mura [[id number]]  Attending Physician: Rhetta Mura [[id number]]  Certification:: I certify this patient will need inpatient services for at least 2 midnights  Estimated Length of Stay: 2          B Medical/Surgery History Past Medical History:  Diagnosis Date   Adenomatous colon polyp    Anemia    Anxiety    Asthma    CHF (congestive heart failure) ([place])    COPD (chronic obstructive pulmonary disease) ([place])    Diabetes mellitus without complication ([place])    High cholesterol    Hypertension    Left knee injury    Pneumonia    Sleep apnea    Supplemental oxygen dependent    2L  [name]   Past Surgical History:  Procedure Laterality Date   ABDOMINAL HYSTERECTOMY     CATARACT EXTRACTION Left    CATARACT EXTRACTION     LT 01/2017, 02/2017 on RT   CESAREAN SECTION     COLONOSCOPY WITH PROPOFOL N/A 04/15/2021   Procedure: COLONOSCOPY WITH PROPOFOL;  Surgeon: Irene Shipper, MD;  Location: [facility];  Service: Endoscopy;  Laterality: N/A;  patient is on O2   JOINT REPLACEMENT Left    Plate in Ankle   POLYPECTOMY  04/15/2021   Procedure: POLYPECTOMY;  Surgeon: Irene Shipper, MD;  Location: [facility]OS[facility];  Service: Endoscopy;;     A IV Location/Drains/Wounds Patient Lines/Drains/Airways Status     Active Line/Drains/Airways     Name Placement date Placement time Site Days   Peripheral IV 03/02/22 20 G Posterior;Proximal;Right Forearm 03/02/22  2236  Forearm  1            Intake/Output Last 24 hours  Intake/Output Summary (Last 24 hours) at 03/03/2022 1743 Last data filed at 03/03/2022 0109 Gross per 24 hour  Intake --  Output 820 ml  Net -820 ml    Labs/Imaging Results for orders placed or performed during the hospital encounter of 03/02/22 (from the past 48 hour(s))  Basic metabolic panel     Status: None   Collection Time: 03/02/22  5:52 PM  Result Value  Ref Range   Sodium 140 135 - 145 mmol/L   Potassium 4.0 3.5 - 5.1 mmol/L   Chloride 101 98 - 111 mmol/L   CO2 30 22 - 32 mmol/L   Glucose, Bld 79 70 - 99 mg/dL    Comment: Glucose reference range applies only to samples taken after fasting for at least 8 hours.   BUN 10 8 - 23 mg/dL   Creatinine, Ser 1.00 0.44 - 1.00 mg/dL   Calcium 9.3 8.9 - 10.3 mg/dL   GFR, Estimated >60 >60 mL/min    Comment: (NOTE) Calculated using the CKD-EPI Creatinine Equation (2021)    Anion gap 9 5 - 15    Comment: Performed at [facility] [address] [place], [place] [address]  CBC     Status: Abnormal   Collection Time: 03/02/22  5:52 PM  Result Value Ref Range   WBC 8.5 4.0 - 10.5 K/uL   RBC 4.45  3.87 - 5.11 MIL/uL   Hemoglobin 12.3 12.0 - 15.0 g/dL   HCT 41.5 36.0 - 46.0 %   MCV 93.3 80.0 - 100.0 fL   MCH 27.6 26.0 - 34.0 pg   MCHC 29.6 (L) 30.0 - 36.0 g/dL   RDW 14.9 11.5 - 15.5 %   Platelets 208 150 - 400 K/uL   nRBC 0.2 0.0 - 0.2 %    Comment: Performed at [facility], [place] [address] [place], [place] [address]  Troponin I (High Sensitivity)     Status: None   Collection Time: 03/02/22  5:52 PM  Result Value Ref Range   Troponin I (High Sensitivity) 5 <18 ng/L    Comment: (NOTE) Elevated high sensitivity troponin I (hsTnI) values and significant  changes across serial measurements may suggest ACS but many other  chronic and acute conditions are known to elevate hsTnI results.  Refer to the "Links" section for chest pain algorithms and additional  guidance. Performed at [facility], [place] [address] [place], [place] [address]   CBG monitoring, ED     Status: Abnormal   Collection Time: 03/02/22  7:26 PM  Result Value Ref Range   Glucose-Capillary 69 (L) 70 - 99 mg/dL    Comment: Glucose reference range applies only to samples taken after fasting for at least 8 hours.  Troponin I (High Sensitivity)     Status: None   Collection Time: 03/02/22 10:33 PM  Result Value Ref Range   Troponin I (High Sensitivity) 4 <18 ng/L    Comment: (NOTE) Elevated high sensitivity troponin I (hsTnI) values and significant  changes across serial measurements may suggest ACS but many other  chronic and acute conditions are known to elevate hsTnI results.  Refer to the "Links" section for chest pain algorithms and additional  guidance. Performed at [facility], [place] [address] [place], [place] [address]   Brain natriuretic peptide     Status: None   Collection Time: 03/02/22 11:18 PM  Result Value Ref Range   B Natriuretic Peptide 12.4 0.0 - 100.0 pg/mL    Comment: Performed at [facility] [address], [place], [place] [address]  I-Stat venous blood  gas, ED     Status: Abnormal   Collection Time: 03/02/22 11:27 PM  Result Value Ref Range   pH, Ven 7.362 7.25 - 7.43   pCO2, Ven 57.1 44 - 60 mmHg   pO2, Ven 57 (H) 32 - 45 mmHg   Bicarbonate  32.4 (H) 20.0 - 28.0 mmol/L   TCO2 34 (H) 22 - 32 mmol/L   O2 Saturation 87 %   Acid-Base Excess 5.0 (H) 0.0 - 2.0 mmol/L   Sodium 141 135 - 145 mmol/L   Potassium 4.0 3.5 - 5.1 mmol/L   Calcium, Ion 1.20 1.15 - 1.40 mmol/L   HCT 38.0 36.0 - 46.0 %   Hemoglobin 12.9 12.0 - 15.0 g/dL   Sample type VENOUS   CBG monitoring, ED     Status: Abnormal   Collection Time: 03/02/22 11:39 PM  Result Value Ref Range   Glucose-Capillary 136 (H) 70 - 99 mg/dL    Comment: Glucose reference range applies only to samples taken after fasting for at least 8 hours.  CBC with Differential/Platelet     Status: None   Collection Time: 03/03/22  5:51 AM  Result Value Ref Range   WBC 7.7 4.0 - 10.5 K/uL   RBC 4.62 3.87 - 5.11 MIL/uL   Hemoglobin 12.8 12.0 - 15.0 g/dL   HCT 42.1 36.0 - 46.0 %   MCV 91.1 80.0 - 100.0 fL   MCH 27.7 26.0 - 34.0 pg   MCHC 30.4 30.0 - 36.0 g/dL   RDW 15.0 11.5 - 15.5 %   Platelets 208 150 - 400 K/uL   nRBC 0.0 0.0 - 0.2 %   Neutrophils Relative % 72 %   Neutro Abs 5.6 1.7 - 7.7 K/uL   Lymphocytes Relative 21 %   Lymphs Abs 1.7 0.7 - 4.0 K/uL   Monocytes Relative 3 %   Monocytes Absolute 0.3 0.1 - 1.0 K/uL   Eosinophils Relative 2 %   Eosinophils Absolute 0.2 0.0 - 0.5 K/uL   Basophils Relative 1 %   Basophils Absolute 0.0 0.0 - 0.1 K/uL   Immature Granulocytes 1 %   Abs Immature Granulocytes 0.04 0.00 - 0.07 K/uL    Comment: Performed at [facility], [address] [place], [place] [address]  Comprehensive metabolic panel     Status: Abnormal   Collection Time: 03/03/22  5:51 AM  Result Value Ref Range   Sodium 140 135 - 145 mmol/L   Potassium 4.0 3.5 - 5.1 mmol/L   Chloride 100 98 - 111 mmol/L   CO2 30 22 - 32 mmol/L   Glucose, Bld 181 (H) 70 - 99 mg/dL     Comment: Glucose reference range applies only to samples taken after fasting for at least 8 hours.   BUN 10 8 - 23 mg/dL   Creatinine, Ser 0.96 0.44 - 1.00 mg/dL   Calcium 9.3 8.9 - 10.3 mg/dL   Total Protein 6.6 6.5 - 8.1 g/dL   Albumin 3.7 3.5 - 5.0 g/dL   AST 20 15 - 41 U/L   ALT 20 0 - 44 U/L   Alkaline Phosphatase 99 38 - 126 U/L   Total Bilirubin 0.5 0.3 - 1.2 mg/dL   GFR, Estimated >60 >60 mL/min    Comment: (NOTE) Calculated using the CKD-EPI Creatinine Equation (2021)    Anion gap 10 5 - 15    Comment: Performed at [facility] [address], [place], [place] [address]  Magnesium     Status: None   Collection Time: 03/03/22  5:51 AM  Result Value Ref Range   Magnesium 1.9 1.7 - 2.4 mg/dL    Comment: Performed at [facility] [address] [place], [place] [address]  Phosphorus     Status: None  Collection Time: 03/03/22  5:51 AM  Result Value Ref Range   Phosphorus 3.5 2.5 - 4.6 mg/dL    Comment: Performed at [facility], [place] [address], [place], [place] [address]  Procalcitonin - Baseline     Status: None   Collection Time: 03/03/22  5:51 AM  Result Value Ref Range   Procalcitonin <0.10 ng/mL    Comment:        Interpretation: PCT (Procalcitonin) <= 0.5 ng/mL: Systemic infection (sepsis) is not likely. Local bacterial infection is possible. (NOTE)       Sepsis PCT Algorithm           Lower Respiratory Tract                                      Infection PCT Algorithm    ----------------------------     ----------------------------         PCT < 0.25 ng/mL                PCT < 0.10 ng/mL          Strongly encourage             Strongly discourage   discontinuation of antibiotics    initiation of antibiotics    ----------------------------     -----------------------------       PCT 0.25 - 0.50 ng/mL            PCT 0.10 - 0.25 ng/mL               OR       >80% decrease in PCT            Discourage initiation of                                             antibiotics      Encourage discontinuation           of antibiotics    ----------------------------     -----------------------------         PCT >= 0.50 ng/mL              PCT 0.26 - 0.50 ng/mL               AND        <80% decrease in PCT             Encourage initiation of                                             antibiotics       Encourage continuation           of antibiotics    ----------------------------     -----------------------------        PCT >= 0.50 ng/mL                  PCT > 0.50 ng/mL               AND         increase in PCT                  Strongly  encourage                                      initiation of antibiotics    Strongly encourage escalation           of antibiotics                                     -----------------------------                                           PCT <= 0.25 ng/mL                                                 OR                                        > 80% decrease in PCT                                      Discontinue / Do not initiate                                             antibiotics  Performed at [facility], [address], [place], [place] [address]   CBG monitoring, ED     Status: Abnormal   Collection Time: 03/03/22  8:56 AM  Result Value Ref Range   Glucose-Capillary 446 (H) 70 - 99 mg/dL    Comment: Glucose reference range applies only to samples taken after fasting for at least 8 hours.  CBG monitoring, ED     Status: Abnormal   Collection Time: 03/03/22 11:48 AM  Result Value Ref Range   Glucose-Capillary 444 (H) 70 - 99 mg/dL    Comment: Glucose reference range applies only to samples taken after fasting for at least 8 hours.   ECHOCARDIOGRAM COMPLETE  Result Date: 03/03/2022    ECHOCARDIOGRAM REPORT   Patient Name:   Heidi [name] Date of Exam: 03/03/2022 Medical Rec #:  [id number]     Height:       64.0 in Accession #:    [id number]    Weight:       245.0 lb Date of  Birth:  [birth year]     BSA:          2.133 m Patient Age:    72 years      BP:           139/64 mmHg Patient Gender: F             HR:           98 bpm. Exam Location:  Inpatient Procedure: 2D Echo, Cardiac Doppler and Color Doppler Indications:    Chest pain  History:  Patient has prior history of Echocardiogram examinations, most                 recent 08/25/2019. CHF, COPD; Signs/Symptoms:Shortness of Breath.  Sonographer:    Clayton Lefort RDCS (AE) Referring Phys: [id number] [facility] A SMITH  Sonographer Comments: Patient is obese and suboptimal subcostal window. Image acquisition challenging due to patient body habitus. IMPRESSIONS  1. Left ventricular ejection fraction, by estimation, is 60 to 65%. The left ventricle has normal function. The left ventricle has no regional wall motion abnormalities. There is moderate left ventricular hypertrophy. Left ventricular diastolic parameters were normal.  2. Right ventricular systolic function is normal. The right ventricular size is normal.  3. The mitral valve is normal in structure. No evidence of mitral valve regurgitation. No evidence of mitral stenosis.  4. The aortic valve is normal in structure. Aortic valve regurgitation is not visualized. No aortic stenosis is present. Comparison(s): Changes from prior study are noted. LVH is increased from mild to moderate. Tribial pericardial effusion is new. FINDINGS  Left Ventricle: Left ventricular ejection fraction, by estimation, is 60 to 65%. The left ventricle has normal function. The left ventricle has no regional wall motion abnormalities. The left ventricular internal cavity size was small. There is moderate  left ventricular hypertrophy. Left ventricular diastolic parameters were normal. Right Ventricle: The right ventricular size is normal. No increase in right ventricular wall thickness. Right ventricular systolic function is normal. Left Atrium: Left atrial size was normal in size. Right Atrium: Right atrial  size was normal in size. Pericardium: Trivial pericardial effusion is present. Mitral Valve: The mitral valve is normal in structure. No evidence of mitral valve regurgitation. No evidence of mitral valve stenosis. Tricuspid Valve: The tricuspid valve is normal in structure. Tricuspid valve regurgitation is not demonstrated. No evidence of tricuspid stenosis. Aortic Valve: The aortic valve is normal in structure. Aortic valve regurgitation is not visualized. No aortic stenosis is present. Aortic valve mean gradient measures 2.0 mmHg. Aortic valve peak gradient measures 4.1 mmHg. Aortic valve area, by VTI measures 2.91 cm. Pulmonic Valve: The pulmonic valve was normal in structure. Pulmonic valve regurgitation is not visualized. No evidence of pulmonic stenosis. Aorta: The aortic root is normal in size and structure. IAS/Shunts: The interatrial septum was not assessed.  LEFT VENTRICLE PLAX 2D LVIDd:         3.70 cm LVIDs:         2.40 cm LV PW:         1.60 cm LV IVS:        1.60 cm LVOT diam:     2.10 cm LV SV:         51 LV SV Index:   24 LVOT Area:     3.46 cm  RIGHT VENTRICLE             IVC RV Basal diam:  2.80 cm     IVC diam: 1.00 cm RV S prime:     15.40 cm/s TAPSE (M-mode): 2.2 cm LEFT ATRIUM           Index        RIGHT ATRIUM           Index LA diam:      3.20 cm 1.50 cm/m   RA Area:     13.60 cm LA Vol (A2C): 30.6 ml 14.35 ml/m  RA Volume:   28.90 ml  13.55 ml/m LA Vol (A4C): 45.7 ml 21.43 ml/m  AORTIC VALVE  AV Area (Vmax):    2.87 cm AV Area (Vmean):   2.78 cm AV Area (VTI):     2.91 cm AV Vmax:           101.00 cm/s AV Vmean:          69.200 cm/s AV VTI:            0.174 m AV Peak Grad:      4.1 mmHg AV Mean Grad:      2.0 mmHg LVOT Vmax:         83.80 cm/s LVOT Vmean:        55.500 cm/s LVOT VTI:          0.146 m LVOT/AV VTI ratio: 0.84  AORTA Ao Root diam: 3.30 cm Ao Asc diam:  2.80 cm  SHUNTS Systemic VTI:  0.15 m Systemic Diam: 2.10 cm Vernell Leep MD Electronically signed by Vernell Leep MD Signature Date/Time: 03/03/2022/12:11:29 PM    Final    DG Chest 1 View  Result Date: 03/02/2022 CLINICAL DATA:  Chest pain, shortness of breath EXAM: CHEST  1 VIEW COMPARISON:  Previous studies including the chest radiographs and CT done on 02/11/2022 FINDINGS: Transverse diameter of heart is increased. There is better inspiration. There is prominence of central pulmonary vessels without signs of alveolar pulmonary edema. There are small linear densities in the lower lung fields. There is improvement in the aeration of lower lung fields. There is no focal pulmonary consolidation. Lateral CP angles are clear. There is no pneumothorax. IMPRESSION: Cardiomegaly. There is interval decrease in pulmonary vascular congestion. Linear densities in the lower lung fields may suggest subsegmental atelectasis. There are no signs of alveolar pulmonary edema or focal pulmonary consolidation. Electronically Signed   By: Elmer Picker M.D.   On: 03/02/2022 18:42    Pending Labs Unresulted Labs (From admission, onward)     Start     Ordered   03/04/22 0500  CBC  Tomorrow morning,   R        03/03/22 1043   03/04/22 2836  Basic metabolic panel  Tomorrow morning,   R        03/03/22 1043   03/04/22 0500  Magnesium  Tomorrow morning,   R        03/03/22 1046   03/03/22 1045  Hemoglobin A1c  Add-on,   AD       Comments: To assess prior glycemic control    03/03/22 1046            Vitals/Pain Today's Vitals   03/03/22 0930 03/03/22 1030 03/03/22 1130 03/03/22 1200  BP: 139/64 115/82 136/61   Pulse: 95 97 97   Resp: (!) 25 (!) 28 (!) 36   Temp:    97.7 F (36.5 C)  TempSrc:    Oral  SpO2: 95% 94% 92%   Weight:      Height:      PainSc:        Isolation Precautions No active isolations  Medications Medications  acetaminophen (TYLENOL) tablet 650 mg (has no administration in time range)    Or  acetaminophen (TYLENOL) suppository 650 mg (has no administration in time range)   albuterol (PROVENTIL) (2.5 MG/3ML) 0.083% nebulizer solution 2.5 mg (has no administration in time range)  enoxaparin (LOVENOX) injection 40 mg (40 mg Subcutaneous Given 03/03/22 1200)  sodium chloride flush (NS) 0.9 % injection 3 mL (3 mLs Intravenous Given 03/03/22 1051)  acetaminophen (TYLENOL) tablet 650 mg (has  no administration in time range)    Or  acetaminophen (TYLENOL) suppository 650 mg (has no administration in time range)  guaiFENesin (MUCINEX) 12 hr tablet 600 mg (600 mg Oral Given 03/03/22 1201)  furosemide (LASIX) injection 40 mg (has no administration in time range)  atenolol (TENORMIN) tablet 25 mg (25 mg Oral Given 03/03/22 1201)  atorvastatin (LIPITOR) tablet 40 mg (has no administration in time range)  losartan (COZAAR) tablet 50 mg (50 mg Oral Given 03/03/22 1201)  topiramate (TOPAMAX) tablet 75 mg (75 mg Oral Given 03/03/22 1201)  montelukast (SINGULAIR) tablet 10 mg (has no administration in time range)  loratadine (CLARITIN) tablet 10 mg (has no administration in time range)  mometasone-formoterol (DULERA) 100-5 MCG/ACT inhaler 2 puff (2 puffs Inhalation Not Given 03/03/22 1409)  naphazoline-glycerin (CLEAR EYES REDNESS) ophth solution 1 drop (has no administration in time range)  albuterol (PROVENTIL) (2.5 MG/3ML) 0.083% nebulizer solution 2.5 mg (2.5 mg Nebulization Given 03/03/22 1533)  predniSONE (DELTASONE) tablet 40 mg (has no administration in time range)  insulin aspart protamine- aspart (NOVOLOG MIX 70/30) injection 40 Units (has no administration in time range)  insulin aspart protamine- aspart (NOVOLOG MIX 70/30) injection 80 Units (has no administration in time range)  insulin aspart (novoLOG) injection 0-9 Units (0 Units Subcutaneous Hold 03/03/22 1156)  sodium chloride flush (NS) 0.9 % injection 3 mL (has no administration in time range)  ipratropium-albuterol (DUONEB) 0.5-2.5 (3) MG/3ML nebulizer solution 3 mL (3 mLs Nebulization Given 03/02/22 2314)   ipratropium-albuterol (DUONEB) 0.5-2.5 (3) MG/3ML nebulizer solution 3 mL (3 mLs Nebulization Given 03/03/22 0056)  furosemide (LASIX) injection 40 mg (40 mg Intravenous Given 03/03/22 0138)  methylPREDNISolone sodium succinate (SOLU-MEDROL) 125 mg/2 mL injection 125 mg (125 mg Intravenous Given 03/03/22 0355)  HYDROcodone-acetaminophen (NORCO/VICODIN) 5-325 MG per tablet 1 tablet (1 tablet Oral Given 03/03/22 0547)  methylPREDNISolone sodium succinate (SOLU-MEDROL) 125 mg/2 mL injection 80 mg (80 mg Intravenous Given 03/03/22 0958)  insulin aspart (novoLOG) injection 10 Units (10 Units Subcutaneous Given 03/03/22 1159)    Mobility walks Low fall risk   Focused Assessments Cardiac Assessment Handoff:    Lab Results  Component Value Date   CKTOTAL 223 01/27/2021   TROPONINI 0.27 (H) 11/27/2014   Lab Results  Component Value Date   DDIMER 1.16 (H) 08/16/2020   Does the Patient currently have chest pain? Yes  She will have cath tomorrow I believe   R Recommendations: See Admitting Provider Note  Report given to:   Additional Notes:

## 2022-03-03 NOTE — H&P (Addendum)
History and Physical    Patient: Heidi [name] ZJI:[id number] DOB: 1951-06-18 DOA: 03/02/2022 DOS: the patient was seen and examined on 03/03/2022 PCP: Ladell Pier, MD  Patient coming from: Home  Chief Complaint:  Chief Complaint  Patient presents with   Chest Pain   Shortness of Breath   HPI: Heidi [name] is a 71 y.o. female with medical history significant of COPD on supplemental oxygen, hyperlipidemia, diastolic CHF, diabetes mellitus type 2, anxiety, OSA, and morbid obesity who initially brought presented with complaints of shortness of breath and chest pain.  Patient reports that she is only on oxygen as needed.  Records notes that she has been advised to use it 24/7.  She reports being seen in the emergency department on 8/9 due to chest pains and shortness of breath.  At that time patient underwent CT angiogram of the chest which noted no signs of a pulmonary embolism and moderate multivessel coronary artery calcification with mild global cardiomegaly, trace pleural pericardial effusion, and signs of pulmonary artery hypertension.  Patient has been given breathing treatment and Lasix 40 mg IV x1 dose.  Admission was not thought to be warranted by the hospitalist at that time and she was recommended to follow-up with her pulmonologist in the outpatient setting.  Patient notes that at home she is unable to walk even 10 feet without her oxygenation dropping down to 70% and she had been wearing her oxygen 24/7.  She has been having a productive cough with increased sputum production, wheezing, leg swelling, orthopnea, and weight gain.  She reports that her weight is supposed be around 225 pounds, but had jumped up as high as 245 last week.  She is down to around 240 last time she checked.  Patient has been using home inhalers, but did not have use of the liquid solution to give herself nebulized treatments.  She reports having substernal chest pains that feel like pressure and has had  some tenderness to palpation.  Pain would radiate to her left arm and make it feel numb. She is also been having cramps that are moving all over her body from her stomach to her back and she just feels awful.  Patient has been taking Tylenol, aspirin, and potassium without relief in symptoms.  She is also had some dizziness and a headache with near syncopal episode.  She did fall but landed on her bed.  She hurt her feet.  Here lately she also reports having some nausea with loose stools, and neck pain for which she was unable to move yesterday.     Upon admission into the emergency department patient was noted to be afebrile, and tachypneic with all other vital signs relatively maintained.  Venous blood gas was within normal limits.  Labs noted high-sensitivity troponins negative x2, procalcitonin less than 0.1, and BNP 12.4.  Chest x-ray noted cardiomegaly with interval decrease in pulmonary vascular congestion and no signs of alveolar pulmonary edema or focal consolidation.  Patient had been treated with 125 mg of Solu-Medrol IV, multiple DuoNeb breathing treatments, hydrocodone, and Lasix 40 mg IV.  Review of Systems: As mentioned in the history of present illness. All other systems reviewed and are negative. Past Medical History:  Diagnosis Date   Adenomatous colon polyp    Anemia    Anxiety    Asthma    CHF (congestive heart failure) (HCC)    COPD (chronic obstructive pulmonary disease) ([place])    Diabetes mellitus without complication ([place])  High cholesterol    Hypertension    Left knee injury    Pneumonia    Sleep apnea    Supplemental oxygen dependent    2L [place]   Past Surgical History:  Procedure Laterality Date   ABDOMINAL HYSTERECTOMY     CATARACT EXTRACTION Left    CATARACT EXTRACTION     LT 01/2017, 02/2017 on RT   CESAREAN SECTION     COLONOSCOPY WITH PROPOFOL N/A 04/15/2021   Procedure: COLONOSCOPY WITH PROPOFOL;  Surgeon: Irene Shipper, MD;  Location: [facility];   Service: Endoscopy;  Laterality: N/A;  patient is on O2   JOINT REPLACEMENT Left    Plate in Ankle   POLYPECTOMY  04/15/2021   Procedure: POLYPECTOMY;  Surgeon: Irene Shipper, MD;  Location: [facility];  Service: Endoscopy;;   Social History:  reports that she has never smoked. She has never been exposed to tobacco smoke. She has never used smokeless tobacco. She reports that she does not drink alcohol and does not use drugs.  Allergies  Allergen Reactions   Januvia [Sitagliptin]     Chest pains   Tramadol Other (See Comments)    hallucinations    Family History  Problem Relation Age of Onset   Colon cancer Father    Diabetes type II Sister    Diabetes type II Brother    Colon cancer Sister    Migraines Neg Hx    Breast cancer Neg Hx     Prior to Admission medications   Medication Sig Start Date End Date Taking? Authorizing Provider  Accu-Chek FastClix Lancets MISC Use as directed to test blood sugar three times daily DX E11.65 07/09/21   Ladell Pier, MD  acetaminophen (TYLENOL) 500 MG tablet Take 500-1,000 mg by mouth every 6 (six) hours as needed (pain).    [provider]  albuterol (PROVENTIL) (2.5 MG/3ML) 0.083% nebulizer solution USE 1 VIAL VIA NEBULIZER EVERY 6 HOURS AS NEEDED FOR WHEEZING OR SHORTNESS OF BREATH 02/21/18   Ladell Pier, MD  albuterol (VENTOLIN HFA) 108 (90 Base) MCG/ACT inhaler Inhale 1-2 puffs into the lungs every 4 (four) hours as needed for wheezing or shortness of breath. 10/21/20   Spero Geralds, MD  aspirin 81 MG chewable tablet Chew 81 mg by mouth daily.    [provider]  atenolol (TENORMIN) 25 MG tablet TAKE 1 TABLET(25 MG) BY MOUTH DAILY 02/27/22   Ladell Pier, MD  atorvastatin (LIPITOR) 40 MG tablet TAKE 1 TABLET BY MOUTH DAILY AT 6 PM Patient taking differently: Take 40 mg by mouth every evening. 12/12/21   Ladell Pier, MD  Blood Glucose Monitoring Suppl (ACCU-CHEK GUIDE ME) w/Device KIT Use as directed  to test blood sugar three times daily DX E11.65 07/09/21   Ladell Pier, MD  Botulinum Toxin Type A (BOTOX) 200 units SOLR Provider to inject 155 units into the muscles of the head and neck every 12 weeks. Discard remainder. 12/29/21   Melvenia Beam, MD  famotidine (PEPCID) 20 MG tablet Take 1 tablet (20 mg total) by mouth daily. 11/07/21   Ladell Pier, MD  fluticasone-salmeterol (ADVAIR DISKUS) 100-50 MCG/ACT AEPB Inhale 1 puff into the lungs 2 (two) times daily. 10/09/21   Margaretha Seeds, MD  furosemide (LASIX) 40 MG tablet TAKE 1 TABLET(40 MG) BY MOUTH DAILY Patient taking differently: Take 40 mg by mouth daily. 11/07/21   Ladell Pier, MD  glucose blood (ACCU-CHEK GUIDE) test strip  Use as directed to test blood sugar three times daily DX E11.65 07/09/21   Ladell Pier, MD  insulin lispro protamine-lispro (HUMALOG MIX 75/25) (75-25) 100 UNIT/ML SUSP injection Inject 80 units in the morning and 45-55 units in the evening. 01/01/22   Ladell Pier, MD  INSULIN SYRINGE 1CC/29G 29G X 1/2" 1 ML MISC USE THREE TIMES DAILY AS DIRECTED 12/22/21   Ladell Pier, MD  loratadine (CLARITIN) 10 MG tablet Take 1 tablet (10 mg total) by mouth daily as needed for allergies. 11/07/21   Ladell Pier, MD  losartan (COZAAR) 50 MG tablet TAKE 1 TABLET(50 MG) BY MOUTH DAILY Patient taking differently: Take 50 mg by mouth daily. 12/19/21   Ladell Pier, MD  montelukast (SINGULAIR) 10 MG tablet Take 1 tablet (10 mg total) by mouth at bedtime. Patient taking differently: Take 10 mg by mouth at bedtime as needed (for allergies). 03/18/20   Tanda Rockers, MD  Multiple Vitamin (MULTIVITAMIN WITH MINERALS) TABS tablet Take 1 tablet by mouth in the morning. Centrum Silver    [provider]  nitroGLYCERIN (NITROSTAT) 0.3 MG SL tablet DISSOLVE 1 TABLET UNDER THE  TONGUE EVERY 5 MINUTES AS NEEDED FOR CHEST PAIN. MAX OF 3 TABLETS IN 15 MINUTES. CALL 911 IF PAIN  PERSISTS. Patient  taking differently: Place 0.3 mg under the tongue every 5 (five) minutes as needed for chest pain. 01/15/22   Ladell Pier, MD  OXYGEN Inhale 2 L into the lungs continuous.    [provider]  Potassium Chloride ER 20 MEQ TBCR TAKE 1 TABLET BY MOUTH DAILY Patient taking differently: Take 20 mEq by mouth daily. 03/01/21   Ladell Pier, MD  Rimegepant Sulfate (NURTEC) 75 MG TBDP Take 75 mg by mouth daily as needed. For migraines. Take as close to onset of migraine as possible. One daily maximum. Patient not taking: Reported on 02/11/2022 02/03/22   Melvenia Beam, MD  SUPREP BOWEL PREP KIT 17.5-3.13-1.6 GM/177ML SOLN Take 1 kit by mouth as directed. For colonoscopy prep 03/05/21   Noralyn Pick, NP  tetrahydrozoline-zinc (VISINE-AC) 0.05-0.25 % ophthalmic solution Place 1 drop into both eyes 3 (three) times daily as needed (dry / irritated eyes).    [provider]  topiramate (TOPAMAX) 25 MG capsule Take 3 capsules (75 mg total) by mouth 2 (two) times daily. 02/03/22   Melvenia Beam, MD    Physical Exam: Vitals:   03/03/22 3704 03/03/22 0455 03/03/22 0615 03/03/22 0730  BP:   129/60 (!) 145/65  Pulse:  85 84   Resp:  (!) 21 (!) 32   Temp: 98.1 F (36.7 C)   97.8 F (36.6 C)  TempSrc: Oral   Oral  SpO2:  97% 93%   Weight:      Height:       Constitutional: Obese elderly female who appears to be in no acute distress at this time Eyes: PERRL, lids and conjunctivae normal ENMT: Mucous membranes are moist.  Neck: normal, supple  Respiratory: Tachypneic with decreased overall aeration and mild wheeze appreciated.  Patient currently on 2 L of oxygen with O2 saturations maintained. Cardiovascular: Tachycardia cardiac without appreciated.  Murmur 2+ pitting edema noted extremity edema. 2+ pedal pulses. No carotid bruits.  Abdomen: no tenderness. Bowel sounds positive.  Musculoskeletal: no clubbing / cyanosis. No joint deformity upper and lower extremities.  Good ROM, no contractures. Normal muscle tone.  Skin: no rashes, lesions, ulcers.   Neurologic: CN  2-12 grossly intact.  Strength 5/5 in all 4.  Psychiatric: Normal judgment and insight. Alert and oriented x 3. Normal mood.   Data Reviewed:  EKG reveals sinus rhythm at 76 bpm with QTc of 409   Assessment and Plan: Moderate persistent asthma with acute exacerbation Chronic respiratory failure with hypoxia Presented with complaints of worsening shortness of breath especially with any exertion, productive cough, and wheezing. O2 sats well-maintained on home 2 L of oxygen at this time.  Patient has been given breathing treatments and Solu-Medrol IV.  Suspect symptoms are likely multifactorial in nature. -Admit to a telemetry bed -Nasal cannula oxygen maintain O2 saturation greater than 92% -Continue albuterol nebs scheduled and as needed -Continue Singulair, Claritin, and pharmacy substitution for Advair -Continue Solu-Medrol 80 mg x 1 dose, then transition to prednisone 40 mg p.o. daily in a.m. -Mucinex  Chest pain coronary calcifications Patient reports having substernal chest pressure with numbness radiation down her left arm.  Troponins negative x2.  CT angiogram of the chest that noted moderate multivessel coronary artery calcifications. -Check echocardiogram -Cardiology consulted, will follow up for any further recommendations  Heart failure with preserved EF pulmonary artery hypertension Chronic.  Patient reports progressively worsening shortness breath with lower extremity swelling and weight gain.  BNP was 12.4, and chest x-ray showed improvement with decreased pulmonary vascular congestion from 8/9.  EF last noted to be 65 to 70% with indeterminate diastolic filling pressures in 2021.  Patient has been given Lasix 40 mg IV x1 dose in the ED. -Strict I&O's and daily weights -Follow-up echocardiogram -Give Lasix 40 mg IV x1 additional dose this evening.  Likely can resume p.o.  Lasix in a.m. -Appreciate cardiology consultative services  Uncontrolled diabetes mellitus type 2, with hyperglycemia Blood sugars elevated up to 181 on admission.  Last hemoglobin A1c 9.9 in 11/2021.  Home medication regimen includes NovoLog 70/30 mix 80 units with breakfast and 50 units with dinner   -Hypoglycemic protocols -Continue  NovoLog 70/30 mix 80 units with breakfast and reduced dinner to 40 units -CBGs before every meal with sensitive SSI due to patient being given steroids -Adjust regimen as needed.  Essential hypertension Blood pressures currently stable.  Home medication regimen includes atenolol 25 mg daily and furosemide 40 mg daily. -Continue atenolol  Migraine headaches Chronic.  Reports having neck pain and headaches while at home.  This seems to be more so chronic issue for which she is followed by Dr. Lavell Anchors -Continue home medication regimen Topamax and Nurtec as needed -Follow-up in outpatient setting with Dr. Lavell Anchors of neurology  Hyperlipidemia -Continue atorvastatin  Morbid obesity Likely has sleep apnea. BMI 42.05 kg/m.    DVT prophylaxis: Lovenox Advance Care Planning:   Code Status: Full Code  Consults: None  Family Communication: None  Severity of Illness: The appropriate patient status for this patient is INPATIENT. Inpatient status is judged to be reasonable and necessary in order to provide the required intensity of service to ensure the patient's safety. The patient's presenting symptoms, physical exam findings, and initial radiographic and laboratory data in the context of their chronic comorbidities is felt to place them at high risk for further clinical deterioration. Furthermore, it is not anticipated that the patient will be medically stable for discharge from the hospital within 2 midnights of admission.   * I certify that at the point of admission it is my clinical judgment that the patient will require inpatient hospital care spanning  beyond 2 midnights from the point of  admission due to high intensity of service, high risk for further deterioration and high frequency of surveillance required.*  Author: Norval Morton, MD 03/03/2022 8:51 AM  For on call review [email or web address].

## 2022-03-04 ENCOUNTER — Encounter (HOSPITAL_COMMUNITY)
Admission: EM | Disposition: A | Discharge status: Home / Self Care | Payer: Self-pay | Source: Home / Self Care | Attending: Internal Medicine

## 2022-03-04 ENCOUNTER — Ambulatory Visit: Payer: Medicare Other | Admitting: Physician Assistant

## 2022-03-04 DIAGNOSIS — I200 Unstable angina: Secondary | ICD-10-CM

## 2022-03-04 DIAGNOSIS — J9611 Chronic respiratory failure with hypoxia: Secondary | ICD-10-CM | POA: Diagnosis not present

## 2022-03-04 HISTORY — PX: LEFT HEART CATH AND CORONARY ANGIOGRAPHY: CATH118249

## 2022-03-04 LAB — BASIC METABOLIC PANEL
Anion gap: 13 (ref 5–15)
BUN: 21 mg/dL (ref 8–23)
CO2: 30 mmol/L (ref 22–32)
Calcium: 9.1 mg/dL (ref 8.9–10.3)
Chloride: 96 mmol/L — ABNORMAL LOW (ref 98–111)
Creatinine, Ser: 1.05 mg/dL — ABNORMAL HIGH (ref 0.44–1.00)
GFR, Estimated: 58 mL/min — ABNORMAL LOW (ref >60)
Glucose, Bld: 350 mg/dL — ABNORMAL HIGH (ref 70–99)
Potassium: 4 mmol/L (ref 3.5–5.1)
Sodium: 139 mmol/L (ref 135–145)

## 2022-03-04 LAB — CBC
HCT: 40.2 % (ref 36.0–46.0)
Hemoglobin: 12.3 g/dL (ref 12.0–15.0)
MCH: 27.7 pg (ref 26.0–34.0)
MCHC: 30.6 g/dL (ref 30.0–36.0)
MCV: 90.5 fL (ref 80.0–100.0)
Platelets: 186 10*3/uL (ref 150–400)
RBC: 4.44 MIL/uL (ref 3.87–5.11)
RDW: 14.9 % (ref 11.5–15.5)
WBC: 7.1 10*3/uL (ref 4.0–10.5)
nRBC: 0 % (ref 0.0–0.2)

## 2022-03-04 LAB — GLUCOSE, CAPILLARY
Glucose-Capillary: 359 mg/dL — ABNORMAL HIGH (ref 70–99)
Glucose-Capillary: 205 mg/dL — ABNORMAL HIGH (ref 70–99)
Glucose-Capillary: 252 mg/dL — ABNORMAL HIGH (ref 70–99)
Glucose-Capillary: 211 mg/dL — ABNORMAL HIGH (ref 70–99)
Glucose-Capillary: 331 mg/dL — ABNORMAL HIGH (ref 70–99)
Glucose-Capillary: 471 mg/dL — ABNORMAL HIGH (ref 70–99)
Glucose-Capillary: 390 mg/dL — ABNORMAL HIGH (ref 70–99)

## 2022-03-04 LAB — MAGNESIUM: Magnesium: 2 mg/dL (ref 1.7–2.4)

## 2022-03-04 SURGERY — LEFT HEART CATH AND CORONARY ANGIOGRAPHY
Anesthesia: LOCAL

## 2022-03-04 MED ORDER — LABETALOL HCL 5 MG/ML IV SOLN: 10.0000 mg | INTRAVENOUS | Status: AC | PRN

## 2022-03-04 MED ORDER — SODIUM CHLORIDE 0.9 % IV SOLN: INTRAVENOUS | Status: AC

## 2022-03-04 MED ORDER — HEPARIN SODIUM (PORCINE) 1000 UNIT/ML IJ SOLN
INTRAMUSCULAR | Status: AC
Start: 2022-03-04 — End: ?
  Filled 2022-03-04: qty 10

## 2022-03-04 MED ORDER — SODIUM CHLORIDE 0.9 % IV SOLN: INTRAVENOUS | Status: DC

## 2022-03-04 MED ORDER — EMPAGLIFLOZIN 10 MG PO TABS
10.0000 mg | ORAL_TABLET | Freq: Every day | ORAL | Status: DC
  Administered 2022-03-05 – 2022-03-07 (×3): 10 mg via ORAL
  Filled 2022-03-04 (×4): qty 1

## 2022-03-04 MED ORDER — INSULIN ASPART PROT & ASPART (70-30 MIX) 100 UNIT/ML ~~LOC~~ SUSP
90.0000 [IU] | Freq: Every day | SUBCUTANEOUS | Status: DC
  Filled 2022-03-04: qty 10

## 2022-03-04 MED ORDER — HYDRALAZINE HCL 20 MG/ML IJ SOLN: 10.0000 mg | INTRAMUSCULAR | Status: AC | PRN

## 2022-03-04 MED ORDER — ACETAMINOPHEN 325 MG PO TABS
650.0000 mg | ORAL_TABLET | ORAL | Status: DC | PRN
  Administered 2022-03-05: 650 mg via ORAL
  Filled 2022-03-04: qty 2

## 2022-03-04 MED ORDER — SODIUM CHLORIDE 0.9% FLUSH
3.0000 mL | INTRAVENOUS | Status: DC | PRN
Start: 2022-03-04 — End: 2022-03-07
  Administered 2022-03-05 – 2022-03-06 (×2): 3 mL via INTRAVENOUS

## 2022-03-04 MED ORDER — LIDOCAINE HCL (PF) 1 % IJ SOLN
INTRAMUSCULAR | Status: DC | PRN
  Administered 2022-03-04: 2 mL

## 2022-03-04 MED ORDER — VERAPAMIL HCL 2.5 MG/ML IV SOLN
INTRAVENOUS | Status: AC
Start: 2022-03-04 — End: ?
  Filled 2022-03-04: qty 2

## 2022-03-04 MED ORDER — SODIUM CHLORIDE 0.9 % IV SOLN
INTRAVENOUS | Status: DC | PRN
  Administered 2022-03-04: 10 mL/h via INTRAVENOUS

## 2022-03-04 MED ORDER — INSULIN ASPART PROT & ASPART (70-30 MIX) 100 UNIT/ML ~~LOC~~ SUSP
50.0000 [IU] | Freq: Every day | SUBCUTANEOUS | Status: DC
  Administered 2022-03-04: 50 [IU] via SUBCUTANEOUS

## 2022-03-04 MED ORDER — MIDAZOLAM HCL 2 MG/2ML IJ SOLN
INTRAMUSCULAR | Status: AC
  Filled 2022-03-04: qty 2

## 2022-03-04 MED ORDER — HEPARIN (PORCINE) IN NACL 1000-0.9 UT/500ML-% IV SOLN
INTRAVENOUS | Status: DC | PRN
  Administered 2022-03-04 (×2): 500 mL

## 2022-03-04 MED ORDER — FENTANYL CITRATE (PF) 100 MCG/2ML IJ SOLN
INTRAMUSCULAR | Status: AC
  Filled 2022-03-04: qty 2

## 2022-03-04 MED ORDER — VERAPAMIL HCL 2.5 MG/ML IV SOLN
INTRAVENOUS | Status: DC | PRN
  Administered 2022-03-04: 10 mL via INTRA_ARTERIAL

## 2022-03-04 MED ORDER — HEPARIN SODIUM (PORCINE) 1000 UNIT/ML IJ SOLN
INTRAMUSCULAR | Status: DC | PRN
  Administered 2022-03-04: 5000 [IU] via INTRAVENOUS
  Administered 2022-03-04: 2000 [IU] via INTRAVENOUS

## 2022-03-04 MED ORDER — IOHEXOL 350 MG/ML SOLN
INTRAVENOUS | Status: DC | PRN
  Administered 2022-03-04: 70 mL

## 2022-03-04 MED ORDER — SODIUM CHLORIDE 0.9 % IV SOLN: 250.0000 mL | INTRAVENOUS | Status: DC | PRN

## 2022-03-04 MED ORDER — INSULIN ASPART 100 UNIT/ML IJ SOLN
0.0000 [IU] | Freq: Three times a day (TID) | INTRAMUSCULAR | Status: DC
  Administered 2022-03-05: 5 [IU] via SUBCUTANEOUS
  Administered 2022-03-05: 3 [IU] via SUBCUTANEOUS

## 2022-03-04 MED ORDER — INSULIN ASPART 100 UNIT/ML IJ SOLN
0.0000 [IU] | Freq: Every day | INTRAMUSCULAR | Status: DC
  Administered 2022-03-04: 5 [IU] via SUBCUTANEOUS
  Administered 2022-03-06: 3 [IU] via SUBCUTANEOUS

## 2022-03-04 MED ORDER — ONDANSETRON HCL 4 MG/2ML IJ SOLN
4.0000 mg | Freq: Four times a day (QID) | INTRAMUSCULAR | Status: DC | PRN
  Administered 2022-03-05 – 2022-03-06 (×2): 4 mg via INTRAVENOUS
  Filled 2022-03-04 (×2): qty 2

## 2022-03-04 MED ORDER — SODIUM CHLORIDE 0.9% FLUSH: 3.0000 mL | INTRAVENOUS | Status: DC | PRN

## 2022-03-04 MED ORDER — LIDOCAINE HCL (PF) 1 % IJ SOLN
INTRAMUSCULAR | Status: AC
Start: 2022-03-04 — End: ?
  Filled 2022-03-04: qty 30

## 2022-03-04 MED ORDER — MIDAZOLAM HCL 2 MG/2ML IJ SOLN
INTRAMUSCULAR | Status: DC | PRN
  Administered 2022-03-04: 1 mg via INTRAVENOUS

## 2022-03-04 MED ORDER — ALBUTEROL SULFATE (2.5 MG/3ML) 0.083% IN NEBU
INHALATION_SOLUTION | RESPIRATORY_TRACT | Status: AC
  Filled 2022-03-04: qty 3

## 2022-03-04 MED ORDER — VERAPAMIL HCL 2.5 MG/ML IV SOLN
INTRAVENOUS | Status: DC | PRN
  Administered 2022-03-04: 2 mg via INTRA_ARTERIAL

## 2022-03-04 MED ORDER — INSULIN ASPART PROT & ASPART (70-30 MIX) 100 UNIT/ML ~~LOC~~ SUSP
10.0000 [IU] | Freq: Once | SUBCUTANEOUS | Status: AC
  Administered 2022-03-04: 10 [IU] via SUBCUTANEOUS
  Filled 2022-03-04: qty 10

## 2022-03-04 MED ORDER — SODIUM CHLORIDE 0.9% FLUSH
3.0000 mL | Freq: Two times a day (BID) | INTRAVENOUS | Status: DC
  Administered 2022-03-04 – 2022-03-05 (×2): 3 mL via INTRAVENOUS

## 2022-03-04 MED ORDER — ASPIRIN 81 MG PO CHEW
81.0000 mg | CHEWABLE_TABLET | ORAL | Status: AC
  Administered 2022-03-04: 81 mg via ORAL
  Filled 2022-03-04: qty 1

## 2022-03-04 MED ORDER — FENTANYL CITRATE (PF) 100 MCG/2ML IJ SOLN
INTRAMUSCULAR | Status: DC | PRN
  Administered 2022-03-04: 25 ug via INTRAVENOUS

## 2022-03-04 MED ORDER — HEPARIN (PORCINE) IN NACL 1000-0.9 UT/500ML-% IV SOLN
INTRAVENOUS | Status: AC
Start: 2022-03-04 — End: ?
  Filled 2022-03-04: qty 1000

## 2022-03-04 SURGICAL SUPPLY — 14 items
BAND ZEPHYR COMPRESS 30 LONG (HEMOSTASIS) IMPLANT
CATH INFINITI 5 FR 3DRC (CATHETERS) IMPLANT
CATH INFINITI 5 FR AR1 MOD (CATHETERS) IMPLANT
CATH INFINITI 5FR AL1 (CATHETERS) IMPLANT
CATH OPTITORQUE TIG 4.0 5F (CATHETERS) IMPLANT
GLIDESHEATH SLEND A-KIT 6F 22G (SHEATH) IMPLANT
GUIDEWIRE INQWIRE 1.5J.035X260 (WIRE) IMPLANT
INQWIRE 1.5J .035X260CM (WIRE) ×2
KIT HEART LEFT (KITS) ×1 IMPLANT
PACK CARDIAC CATHETERIZATION (CUSTOM PROCEDURE TRAY) ×1 IMPLANT
SHEATH 6FR 75 DEST SLENDER (SHEATH) IMPLANT
SHEATH PROBE COVER 6X72 (BAG) IMPLANT
TRANSDUCER W/STOPCOCK (MISCELLANEOUS) ×1 IMPLANT
TUBING CIL FLEX 10 FLL-RA (TUBING) ×1 IMPLANT

## 2022-03-04 NOTE — CV Procedure (Signed)
Mild calcium and minimal luminal irregularities in LAD. Since pain was responsive to NTG< okay to use Imdur 30 mg on discharge. Continue statin. Aspirin not needed.  Okay to discharge today if not hypoxic. May use lasix 40 mg daily and add Jardiance 10 mg daily, if not already on it.  Full report to follow.  Will arrange outpatient follow up.   Nigel Mormon, MD Pager: [phone] Office: [phone]

## 2022-03-04 NOTE — Progress Notes (Signed)
PROGRESS NOTE    Heidi [name]  SAY:[id number] DOB: [birth year] DOA: 03/02/2022 PCP: Ladell Pier, MD  70/F with history of COPD on home O2, chronic diastolic CHF, type 2 diabetes mellitus, anxiety, morbid obesity, OSA, dyslipidemia presented to the ED with shortness of breath and chest pain, prior to that was seen in the ED on 8/9 with chest pain and shortness of breath as well, CTA chest was negative for PE, noted moderate multivessel coronary calcification and cardiomegaly. -In the ED she was noted to be tachypneic, high-sensitivity troponins were negative x2, chest x-ray noted cardiomegaly and an interval decrease in pulmonary vascular congestion   Subjective: -No chest pain overnight, breathing is improving  Assessment and Plan:  Recurrent chest pain Coronary calcifications noted on CT chest -HStroponins negative to X2  -multiple cardiac risk factors including diabetes and tobacco use -Cardiology consulting, plan for left heart cath today  Moderate persistent asthma with acute exacerbation Chronic respiratory failure with hypoxia -She was started on steroids and DuoNebs yesterday  -I am not convinced she is truly having an asthma exacerbation, will taper prednisone  -Continue DuoNebs, Singulair Claritin  -Wean O2 as tolerated  Acute on chronic diastolic CHF  Pulmonary hypertension -Chest x-ray with decreasing pulmonary vascular congestion, EF last noted to be 65 to 70% with indeterminate diastolic filling pressures in 2021 -Follow-up repeat echo, -Continue IV Lasix 1 more day, cards consulting, plan for cath today -Add SGLT2i  Uncontrolled diabetes mellitus type 2, with hyperglycemia -CBGs poorly controlled on steroids -Increase p.m. dose of insulin to 50 units, continue 80 units of NovoLog Mix   Essential hypertension -Continue atenolol  Migraine headaches Chronic.  Reports having neck pain and headaches while at home.  This seems to be more so chronic issue  for which she is followed by Dr. Jaynee Eagles -Continue home medication regimen Topamax and Nurtec as needed   Hyperlipidemia -Continue atorvastatin   Morbid obesity Likely has sleep apnea. BMI 42.05 kg/m.       DVT prophylaxis: Lovenox Advance Care Planning:   Code Status: Full Code  Family Communication: Discussed with patient in detail, no family at bedside Disposition Plan:   Consultants:    Procedures:   Antimicrobials:    Objective: Vitals:   03/04/22 1205 03/04/22 1220 03/04/22 1241 03/04/22 1354  BP: 139/65 (!) 136/53 139/73 129/67  Pulse: 82 83 87 70  Resp: (!) 31 (!) 29 20   Temp:   98 F (36.7 C)   TempSrc:   Oral   SpO2: 93% 92% 97% 95%  Weight:      Height:       No intake or output data in the 24 hours ending 03/04/22 1525 Filed Weights   03/02/22 1555 03/04/22 0500  Weight: 111.1 kg 112.4 kg    Examination:  General exam: Appears calm and comfortable  Respiratory system: Clear to auscultation Cardiovascular system: S1 & S2 heard, RRR.  Abd: nondistended, soft and nontender.Normal bowel sounds heard. Central nervous system: Alert and oriented. No focal neurological deficits. Extremities: no edema Skin: No rashes Psychiatry:  Mood & affect appropriate.     Data Reviewed:   CBC: Recent Labs  Lab 03/02/22 1752 03/02/22 2327 03/03/22 0551 03/04/22 1318  WBC 8.5  --  7.7 7.1  NEUTROABS  --   --  5.6  --   HGB 12.3 12.9 12.8 12.3  HCT 41.5 38.0 42.1 40.2  MCV 93.3  --  91.1 90.5  PLT 208  --  208  130   Basic Metabolic Panel: Recent Labs  Lab 03/02/22 1752 03/02/22 2327 03/03/22 0551 03/04/22 1318  NA 140 141 140 139  K 4.0 4.0 4.0 4.0  CL 101  --  100 96*  CO2 30  --  30 30  GLUCOSE 79  --  181* 350*  BUN 10  --  10 21  CREATININE 1.00  --  0.96 1.05*  CALCIUM 9.3  --  9.3 9.1  MG  --   --  1.9 2.0  PHOS  --   --  3.5  --    GFR: Estimated Creatinine Clearance: 62.1 mL/min (A) (by C-G formula based on SCr of 1.05 mg/dL  (H)). Liver Function Tests: Recent Labs  Lab 03/03/22 0551  AST 20  ALT 20  ALKPHOS 99  BILITOT 0.5  PROT 6.6  ALBUMIN 3.7   No results for input(s): "LIPASE", "AMYLASE" in the last 168 hours. No results for input(s): "AMMONIA" in the last 168 hours. Coagulation Profile: No results for input(s): "INR", "PROTIME" in the last 168 hours. Cardiac Enzymes: No results for input(s): "CKTOTAL", "CKMB", "CKMBINDEX", "TROPONINI" in the last 168 hours. BNP (last 3 results) No results for input(s): "PROBNP" in the last 8760 hours. HbA1C: Recent Labs    03/03/22 0500  HGBA1C 9.8*   CBG: Recent Labs  Lab 03/04/22 0001 03/04/22 0420 03/04/22 0748 03/04/22 0936 03/04/22 1258  GLUCAP 359* 205* 252* 211* 331*   Lipid Profile: No results for input(s): "CHOL", "HDL", "LDLCALC", "TRIG", "CHOLHDL", "LDLDIRECT" in the last 72 hours. Thyroid Function Tests: No results for input(s): "TSH", "T4TOTAL", "FREET4", "T3FREE", "THYROIDAB" in the last 72 hours. Anemia Panel: No results for input(s): "VITAMINB12", "FOLATE", "FERRITIN", "TIBC", "IRON", "RETICCTPCT" in the last 72 hours. Urine analysis:    Component Value Date/Time   COLORURINE YELLOW 01/26/2021 2308   APPEARANCEUR CLEAR 01/26/2021 2308   LABSPEC 1.023 01/26/2021 2308   PHURINE 6.0 01/26/2021 2308   GLUCOSEU >=500 (A) 01/26/2021 2308   HGBUR NEGATIVE 01/26/2021 2308   BILIRUBINUR NEGATIVE 01/26/2021 2308   BILIRUBINUR Negative 05/12/2016 [place] 01/26/2021 2308   PROTEINUR NEGATIVE 01/26/2021 2308   UROBILINOGEN 0.2 04/25/2019 1508   NITRITE NEGATIVE 01/26/2021 2308   LEUKOCYTESUR NEGATIVE 01/26/2021 2308   Sepsis Labs: '@LABRCNTIP'$ (procalcitonin:4,lacticidven:4)  )No results found for this or any previous visit (from the past 240 hour(s)).   Radiology Studies: CARDIAC CATHETERIZATION  Result Date: 03/04/2022 Images from the original result were not included. LM: Normal LAD: Medial calcification,  minimal luminal irregularities Lcx: Normal RCA: Normal Mildly elevated LVEDP Complex procedure due to severe tortuosity requiring the use of long sheath and multiple catheters. No obstructive CAD. Given nitrate responsive pain, consider using long acting nitrate, as it could help with coronary vasospasm or endothelial dysfunction. Nigel Mormon, MD Pager: [phone] Office: [phone]   ECHOCARDIOGRAM COMPLETE  Result Date: 03/03/2022    ECHOCARDIOGRAM REPORT   Patient Name:   Heidi [name] Date of Exam: 03/03/2022 Medical Rec #:  [id number]     Height:       64.0 in Accession #:    [id number]    Weight:       245.0 lb Date of Birth:  08/17/1950     BSA:          2.133 m Patient Age:    48 years      BP:           139/64 mmHg Patient Gender: F  HR:           98 bpm. Exam Location:  Inpatient Procedure: 2D Echo, Cardiac Doppler and Color Doppler Indications:    Chest pain  History:        Patient has prior history of Echocardiogram examinations, most                 recent 08/25/2019. CHF, COPD; Signs/Symptoms:Shortness of Breath.  Sonographer:    Clayton Lefort RDCS (AE) Referring Phys: [id number] [facility] A SMITH  Sonographer Comments: Patient is obese and suboptimal subcostal window. Image acquisition challenging due to patient body habitus. IMPRESSIONS  1. Left ventricular ejection fraction, by estimation, is 60 to 65%. The left ventricle has normal function. The left ventricle has no regional wall motion abnormalities. There is moderate left ventricular hypertrophy. Left ventricular diastolic parameters were normal.  2. Right ventricular systolic function is normal. The right ventricular size is normal.  3. The mitral valve is normal in structure. No evidence of mitral valve regurgitation. No evidence of mitral stenosis.  4. The aortic valve is normal in structure. Aortic valve regurgitation is not visualized. No aortic stenosis is present. Comparison(s): Changes from prior study are noted. LVH is  increased from mild to moderate. Tribial pericardial effusion is new. FINDINGS  Left Ventricle: Left ventricular ejection fraction, by estimation, is 60 to 65%. The left ventricle has normal function. The left ventricle has no regional wall motion abnormalities. The left ventricular internal cavity size was small. There is moderate  left ventricular hypertrophy. Left ventricular diastolic parameters were normal. Right Ventricle: The right ventricular size is normal. No increase in right ventricular wall thickness. Right ventricular systolic function is normal. Left Atrium: Left atrial size was normal in size. Right Atrium: Right atrial size was normal in size. Pericardium: Trivial pericardial effusion is present. Mitral Valve: The mitral valve is normal in structure. No evidence of mitral valve regurgitation. No evidence of mitral valve stenosis. Tricuspid Valve: The tricuspid valve is normal in structure. Tricuspid valve regurgitation is not demonstrated. No evidence of tricuspid stenosis. Aortic Valve: The aortic valve is normal in structure. Aortic valve regurgitation is not visualized. No aortic stenosis is present. Aortic valve mean gradient measures 2.0 mmHg. Aortic valve peak gradient measures 4.1 mmHg. Aortic valve area, by VTI measures 2.91 cm. Pulmonic Valve: The pulmonic valve was normal in structure. Pulmonic valve regurgitation is not visualized. No evidence of pulmonic stenosis. Aorta: The aortic root is normal in size and structure. IAS/Shunts: The interatrial septum was not assessed.  LEFT VENTRICLE PLAX 2D LVIDd:         3.70 cm LVIDs:         2.40 cm LV PW:         1.60 cm LV IVS:        1.60 cm LVOT diam:     2.10 cm LV SV:         51 LV SV Index:   24 LVOT Area:     3.46 cm  RIGHT VENTRICLE             IVC RV Basal diam:  2.80 cm     IVC diam: 1.00 cm RV S prime:     15.40 cm/s TAPSE (M-mode): 2.2 cm LEFT ATRIUM           Index        RIGHT ATRIUM           Index LA diam:      3.20  cm 1.50  cm/m   RA Area:     13.60 cm LA Vol (A2C): 30.6 ml 14.35 ml/m  RA Volume:   28.90 ml  13.55 ml/m LA Vol (A4C): 45.7 ml 21.43 ml/m  AORTIC VALVE AV Area (Vmax):    2.87 cm AV Area (Vmean):   2.78 cm AV Area (VTI):     2.91 cm AV Vmax:           101.00 cm/s AV Vmean:          69.200 cm/s AV VTI:            0.174 m AV Peak Grad:      4.1 mmHg AV Mean Grad:      2.0 mmHg LVOT Vmax:         83.80 cm/s LVOT Vmean:        55.500 cm/s LVOT VTI:          0.146 m LVOT/AV VTI ratio: 0.84  AORTA Ao Root diam: 3.30 cm Ao Asc diam:  2.80 cm  SHUNTS Systemic VTI:  0.15 m Systemic Diam: 2.10 cm Vernell Leep MD Electronically signed by Vernell Leep MD Signature Date/Time: 03/03/2022/12:11:29 PM    Final    DG Chest 1 View  Result Date: 03/02/2022 CLINICAL DATA:  Chest pain, shortness of breath EXAM: CHEST  1 VIEW COMPARISON:  Previous studies including the chest radiographs and CT done on 02/11/2022 FINDINGS: Transverse diameter of heart is increased. There is better inspiration. There is prominence of central pulmonary vessels without signs of alveolar pulmonary edema. There are small linear densities in the lower lung fields. There is improvement in the aeration of lower lung fields. There is no focal pulmonary consolidation. Lateral CP angles are clear. There is no pneumothorax. IMPRESSION: Cardiomegaly. There is interval decrease in pulmonary vascular congestion. Linear densities in the lower lung fields may suggest subsegmental atelectasis. There are no signs of alveolar pulmonary edema or focal pulmonary consolidation. Electronically Signed   By: Elmer Picker M.D.   On: 03/02/2022 18:42     Scheduled Meds:  albuterol  2.5 mg Nebulization TID   atenolol  25 mg Oral Daily   atorvastatin  40 mg Oral QPM   enoxaparin (LOVENOX) injection  40 mg Subcutaneous Q24H   guaiFENesin  600 mg Oral BID   insulin aspart  0-9 Units Subcutaneous TID WC   insulin aspart protamine- aspart  40 Units  Subcutaneous Q supper   insulin aspart protamine- aspart  80 Units Subcutaneous Q breakfast   losartan  50 mg Oral Daily   mometasone-formoterol  2 puff Inhalation BID   montelukast  10 mg Oral QHS   predniSONE  40 mg Oral Q breakfast   sodium chloride flush  3 mL Intravenous Q12H   sodium chloride flush  3 mL Intravenous Q12H   sodium chloride flush  3 mL Intravenous Q12H   topiramate  75 mg Oral BID   Continuous Infusions:  sodium chloride       LOS: 1 day    Time spent: 34mn   PDomenic Polite MD [facility]   03/04/2022, 3:25 PM

## 2022-03-04 NOTE — Interval H&P Note (Signed)
History and Physical Interval Note:  03/04/2022 8:16 AM  Heidi [name]  has presented today for surgery, with the diagnosis of unstable angina.  The various methods of treatment have been discussed with the patient and family. After consideration of risks, benefits and other options for treatment, the patient has consented to  Procedure(s): LEFT HEART CATH AND CORONARY ANGIOGRAPHY (N/A) as a surgical intervention.  The patient's history has been reviewed, patient examined, no change in status, stable for surgery.  I have reviewed the patient's chart and labs.  Questions were answered to the patient's satisfaction.    2016 Appropriate Use Criteria for Coronary Revascularization in Patients With Acute Coronary Syndrome NSTEMI/Unstable angina, stabilized patient at Intermediate Risk (TIMI Score 3-4) Indication:  Revascularization by PCI or CABG of 1 or more arteries in a patient with NSTEMI or unstable angina with Stabilization after presentation Intermediate risk for clinical events A (7) Indication: 16; Score 7   [place]

## 2022-03-05 ENCOUNTER — Encounter (HOSPITAL_COMMUNITY): Payer: Self-pay | Admitting: Cardiology

## 2022-03-05 ENCOUNTER — Telehealth (HOSPITAL_COMMUNITY): Payer: Self-pay | Admitting: Pharmacy Technician

## 2022-03-05 ENCOUNTER — Other Ambulatory Visit (HOSPITAL_COMMUNITY): Payer: Self-pay

## 2022-03-05 DIAGNOSIS — J9611 Chronic respiratory failure with hypoxia: Secondary | ICD-10-CM | POA: Diagnosis not present

## 2022-03-05 LAB — BASIC METABOLIC PANEL
Anion gap: 6 (ref 5–15)
BUN: 26 mg/dL — ABNORMAL HIGH (ref 8–23)
CO2: 32 mmol/L (ref 22–32)
Calcium: 9 mg/dL (ref 8.9–10.3)
Chloride: 102 mmol/L (ref 98–111)
Creatinine, Ser: 1.23 mg/dL — ABNORMAL HIGH (ref 0.44–1.00)
GFR, Estimated: 48 mL/min — ABNORMAL LOW (ref >60)
Glucose, Bld: 220 mg/dL — ABNORMAL HIGH (ref 70–99)
Potassium: 4.1 mmol/L (ref 3.5–5.1)
Sodium: 140 mmol/L (ref 135–145)

## 2022-03-05 LAB — GLUCOSE, CAPILLARY
Glucose-Capillary: 154 mg/dL — ABNORMAL HIGH (ref 70–99)
Glucose-Capillary: 154 mg/dL — ABNORMAL HIGH (ref 70–99)
Glucose-Capillary: 217 mg/dL — ABNORMAL HIGH (ref 70–99)
Glucose-Capillary: 85 mg/dL (ref 70–99)
Glucose-Capillary: 130 mg/dL — ABNORMAL HIGH (ref 70–99)
Glucose-Capillary: 104 mg/dL — ABNORMAL HIGH (ref 70–99)

## 2022-03-05 LAB — CBC
HCT: 38.7 % (ref 36.0–46.0)
Hemoglobin: 11.9 g/dL — ABNORMAL LOW (ref 12.0–15.0)
MCH: 27.5 pg (ref 26.0–34.0)
MCHC: 30.7 g/dL (ref 30.0–36.0)
MCV: 89.6 fL (ref 80.0–100.0)
Platelets: 194 10*3/uL (ref 150–400)
RBC: 4.32 MIL/uL (ref 3.87–5.11)
RDW: 14.9 % (ref 11.5–15.5)
WBC: 8.4 10*3/uL (ref 4.0–10.5)
nRBC: 0 % (ref 0.0–0.2)

## 2022-03-05 MED ORDER — INSULIN ASPART PROT & ASPART (70-30 MIX) 100 UNIT/ML ~~LOC~~ SUSP
80.0000 [IU] | Freq: Every day | SUBCUTANEOUS | Status: DC
  Administered 2022-03-05: 80 [IU] via SUBCUTANEOUS

## 2022-03-05 MED ORDER — FUROSEMIDE 10 MG/ML IJ SOLN
40.0000 mg | Freq: Once | INTRAMUSCULAR | Status: AC
  Administered 2022-03-05: 40 mg via INTRAVENOUS
  Filled 2022-03-05: qty 4

## 2022-03-05 MED ORDER — POTASSIUM CHLORIDE CRYS ER 20 MEQ PO TBCR
20.0000 meq | EXTENDED_RELEASE_TABLET | Freq: Once | ORAL | Status: AC
Start: 2022-03-05 — End: 2022-03-05
  Administered 2022-03-05: 20 meq via ORAL
  Filled 2022-03-05: qty 1

## 2022-03-05 MED ORDER — ISOSORBIDE MONONITRATE ER 30 MG PO TB24
15.0000 mg | ORAL_TABLET | Freq: Every day | ORAL | Status: DC
  Administered 2022-03-05: 15 mg via ORAL
  Filled 2022-03-05: qty 1

## 2022-03-05 MED ORDER — INSULIN ASPART PROT & ASPART (70-30 MIX) 100 UNIT/ML ~~LOC~~ SUSP
20.0000 [IU] | Freq: Once | SUBCUTANEOUS | Status: AC
  Administered 2022-03-05: 20 [IU] via SUBCUTANEOUS
  Filled 2022-03-05: qty 10

## 2022-03-05 MED ORDER — INSULIN ASPART PROT & ASPART (70-30 MIX) 100 UNIT/ML ~~LOC~~ SUSP: 100.0000 [IU] | Freq: Every day | SUBCUTANEOUS | Status: DC

## 2022-03-05 MED ORDER — METHOCARBAMOL 500 MG PO TABS
500.0000 mg | ORAL_TABLET | Freq: Four times a day (QID) | ORAL | Status: DC | PRN
  Administered 2022-03-05 – 2022-03-07 (×3): 500 mg via ORAL
  Filled 2022-03-05 (×3): qty 1

## 2022-03-05 NOTE — Progress Notes (Addendum)
PROGRESS NOTE    Heidi [name]  JOA:[id number] DOB: 09-Jan-1951 DOA: 03/02/2022 PCP: Ladell Pier, MD  70/F with history of COPD on home O2, chronic diastolic CHF, type 2 diabetes mellitus, anxiety, morbid obesity, OSA, dyslipidemia presented to the ED with shortness of breath and chest pain, prior to that was seen in the ED on 8/9 with chest pain and shortness of breath as well, CTA chest was negative for PE, noted moderate multivessel coronary calcification and cardiomegaly. -In the ED she was noted to be tachypneic, high-sensitivity troponins were negative x2, chest x-ray noted cardiomegaly and an interval decrease in pulmonary vascular congestion  Subjective: -Feels better today, some dyspnea overnight, breathing continues to improve  Assessment and Plan:  Recurrent chest pain Coronary calcifications noted on CT chest -HStroponins negative to X2  -multiple cardiac risk factors including diabetes and tobacco use -Underwent left heart cath yesterday, no obstructive CAD noted, recommended long-acting nitrates  Chronic respiratory failure with hypoxia History of COPD -She was started on steroids and DuoNebs on admission -Taper off steroids, continue DuoNebs, Singulair, Claritin, wean O2 down to baseline 2 L -Needs outpatient sleep study  Acute on chronic diastolic CHF  Pulmonary hypertension -Chest x-ray with decreasing pulmonary vascular congestion, EF last noted to be 65 to 70% with indeterminate diastolic filling pressures in 2021 -Repeat echo with preserved EF, moderate LVH -Repeat dose of IV Lasix today, started Jardiance  Uncontrolled diabetes mellitus type 2, with hyperglycemia -CBGs poorly controlled on steroids -Improving on increased regimen of NovoLog 70/30 mix   Essential hypertension -Continue atenolol  Migraine headaches Chronic.  Reports having neck pain and headaches while at home.  This seems to be more so chronic issue for which she is followed by Dr.  Jaynee Eagles -Continue home medication regimen Topamax and Nurtec as needed   Hyperlipidemia -Continue atorvastatin   Morbid obesity Likely has sleep apnea. BMI 42.05 kg/m.       DVT prophylaxis: Lovenox Advance Care Planning:   Code Status: Full Code  Family Communication: Discussed with patient in detail, no family at bedside Disposition Plan: Home tomorrow  Consultants: Cards   Procedures: No obstructive CAD  Antimicrobials:    Objective: Vitals:   03/05/22 0030 03/05/22 0442 03/05/22 0752 03/05/22 1205  BP: 122/66 139/71  134/77  Pulse: 63 86  77  Resp: '16 19  19  '$ Temp: 98 F (36.7 C) 98 F (36.7 C)  97.6 F (36.4 C)  TempSrc: Oral Oral  Oral  SpO2: 100% 100% 98% 97%  Weight:  109.5 kg    Height:        Intake/Output Summary (Last 24 hours) at 03/05/2022 1430 Last data filed at 03/04/2022 2100 Gross per 24 hour  Intake 240 ml  Output --  Net 240 ml   Filed Weights   03/02/22 1555 03/04/22 0500 03/05/22 0442  Weight: 111.1 kg 112.4 kg 109.5 kg    Examination:  General exam: Appears calm and comfortable  Respiratory system: Clear to auscultation Cardiovascular system: S1 & S2 heard, RRR.  Abd: nondistended, soft and nontender.Normal bowel sounds heard. Central nervous system: Alert and oriented. No focal neurological deficits. Extremities: no edema Skin: No rashes Psychiatry:  Mood & affect appropriate.     Data Reviewed:   CBC: Recent Labs  Lab 03/02/22 1752 03/02/22 2327 03/03/22 0551 03/04/22 1318 03/05/22 0306  WBC 8.5  --  7.7 7.1 8.4  NEUTROABS  --   --  5.6  --   --  HGB 12.3 12.9 12.8 12.3 11.9*  HCT 41.5 38.0 42.1 40.2 38.7  MCV 93.3  --  91.1 90.5 89.6  PLT 208  --  208 186 161   Basic Metabolic Panel: Recent Labs  Lab 03/02/22 1752 03/02/22 2327 03/03/22 0551 03/04/22 1318 03/05/22 0306  NA 140 141 140 139 140  K 4.0 4.0 4.0 4.0 4.1  CL 101  --  100 96* 102  CO2 30  --  30 30 32  GLUCOSE 79  --  181* 350* 220*  BUN  10  --  10 21 26*  CREATININE 1.00  --  0.96 1.05* 1.23*  CALCIUM 9.3  --  9.3 9.1 9.0  MG  --   --  1.9 2.0  --   PHOS  --   --  3.5  --   --    GFR: Estimated Creatinine Clearance: 52.2 mL/min (A) (by C-G formula based on SCr of 1.23 mg/dL (H)). Liver Function Tests: Recent Labs  Lab 03/03/22 0551  AST 20  ALT 20  ALKPHOS 99  BILITOT 0.5  PROT 6.6  ALBUMIN 3.7   No results for input(s): "LIPASE", "AMYLASE" in the last 168 hours. No results for input(s): "AMMONIA" in the last 168 hours. Coagulation Profile: No results for input(s): "INR", "PROTIME" in the last 168 hours. Cardiac Enzymes: No results for input(s): "CKTOTAL", "CKMB", "CKMBINDEX", "TROPONINI" in the last 168 hours. BNP (last 3 results) No results for input(s): "PROBNP" in the last 8760 hours. HbA1C: Recent Labs    03/03/22 0500  HGBA1C 9.8*   CBG: Recent Labs  Lab 03/04/22 1646 03/04/22 2139 03/05/22 0441 03/05/22 0816 03/05/22 1202  GLUCAP 471* 390* 154* 154* 217*   Lipid Profile: No results for input(s): "CHOL", "HDL", "LDLCALC", "TRIG", "CHOLHDL", "LDLDIRECT" in the last 72 hours. Thyroid Function Tests: No results for input(s): "TSH", "T4TOTAL", "FREET4", "T3FREE", "THYROIDAB" in the last 72 hours. Anemia Panel: No results for input(s): "VITAMINB12", "FOLATE", "FERRITIN", "TIBC", "IRON", "RETICCTPCT" in the last 72 hours. Urine analysis:    Component Value Date/Time   COLORURINE YELLOW 01/26/2021 2308   APPEARANCEUR CLEAR 01/26/2021 2308   LABSPEC 1.023 01/26/2021 2308   PHURINE 6.0 01/26/2021 2308   GLUCOSEU >=500 (A) 01/26/2021 2308   HGBUR NEGATIVE 01/26/2021 2308   BILIRUBINUR NEGATIVE 01/26/2021 2308   BILIRUBINUR Negative 05/12/2016 [place] 01/26/2021 2308   PROTEINUR NEGATIVE 01/26/2021 2308   UROBILINOGEN 0.2 04/25/2019 1508   NITRITE NEGATIVE 01/26/2021 2308   LEUKOCYTESUR NEGATIVE 01/26/2021 2308   Sepsis  Labs: '@LABRCNTIP'$ (procalcitonin:4,lacticidven:4)  )No results found for this or any previous visit (from the past 240 hour(s)).   Radiology Studies: CARDIAC CATHETERIZATION  Result Date: 03/04/2022 Images from the original result were not included. LM: Normal LAD: Medial calcification, minimal luminal irregularities Lcx: Normal RCA: Normal Mildly elevated LVEDP Complex procedure due to severe tortuosity requiring the use of long sheath and multiple catheters. No obstructive CAD. Given nitrate responsive pain, consider using long acting nitrate, as it could help with coronary vasospasm or endothelial dysfunction. Nigel Mormon, MD Pager: [phone] Office: [phone]     Scheduled Meds:  atenolol  25 mg Oral Daily   atorvastatin  40 mg Oral QPM   empagliflozin  10 mg Oral Daily   enoxaparin (LOVENOX) injection  40 mg Subcutaneous Q24H   guaiFENesin  600 mg Oral BID   insulin aspart  0-15 Units Subcutaneous TID WC   insulin aspart  0-5 Units Subcutaneous QHS  insulin aspart protamine- aspart  50 Units Subcutaneous Q supper   insulin aspart protamine- aspart  80 Units Subcutaneous Q breakfast   losartan  50 mg Oral Daily   mometasone-formoterol  2 puff Inhalation BID   montelukast  10 mg Oral QHS   sodium chloride flush  3 mL Intravenous Q12H   sodium chloride flush  3 mL Intravenous Q12H   sodium chloride flush  3 mL Intravenous Q12H   topiramate  75 mg Oral BID   Continuous Infusions:  sodium chloride       LOS: 2 days    Time spent: 62mn   PDomenic Polite MD [facility]   03/05/2022, 2:30 PM

## 2022-03-05 NOTE — Plan of Care (Signed)

## 2022-03-05 NOTE — Telephone Encounter (Signed)
Pharmacy Patient Advocate Encounter  Insurance verification completed.    The patient is insured through [employer] Medicare Part D   The patient is currently admitted and ran test claims for the following: Jardiance.  Copays and coinsurance results were relayed to Inpatient clinical team.  

## 2022-03-05 NOTE — Care Management (Signed)
  Transition of Care [facility]) Screening Note   Patient Details  Name: Sheriece Jefcoat Date of Birth: February 21, 1951   Transition of Care [facility]) CM/SW Contact:    Bethena Roys, RN Phone Number: 03/05/2022, 3:25 PM    Transition of Care Department [facility]) has reviewed the patient and no TOC needs have been identified at this time. Patient states she was currently independent in her apartment. Patient uses oxygen via [place]. Case Manager did call [employer] and she has 2 Liters ordered. If patient needs a higher Liter Flow-please call [employer]. We will continue to monitor patient advancement through interdisciplinary progression rounds. If new patient transition needs arise, please place a TOC consult.

## 2022-03-05 NOTE — TOC Benefit Eligibility Note (Signed)
Patient Teacher, English as a foreign language completed.    The patient is currently admitted and upon discharge could be taking Jardiance 10 mg.  The current 30 day co-pay is $95.20 due to being in Coverage Gap (donut hole).   The patient is insured through [place], [place] Patient Advocate Specialist [place] Patient Advocate Team Direct Number: [phone]  Fax: [phone]

## 2022-03-06 ENCOUNTER — Encounter (HOSPITAL_COMMUNITY): Payer: Self-pay | Admitting: Anesthesiology

## 2022-03-06 ENCOUNTER — Other Ambulatory Visit: Payer: Self-pay | Admitting: Cardiology

## 2022-03-06 ENCOUNTER — Encounter (HOSPITAL_COMMUNITY): Payer: Self-pay | Admitting: Internal Medicine

## 2022-03-06 ENCOUNTER — Encounter (HOSPITAL_COMMUNITY)
Admission: EM | Disposition: A | Discharge status: Home / Self Care | Payer: Self-pay | Source: Home / Self Care | Attending: Internal Medicine

## 2022-03-06 DIAGNOSIS — J9611 Chronic respiratory failure with hypoxia: Secondary | ICD-10-CM | POA: Diagnosis not present

## 2022-03-06 DIAGNOSIS — I480 Paroxysmal atrial fibrillation: Secondary | ICD-10-CM

## 2022-03-06 LAB — BASIC METABOLIC PANEL
Anion gap: 9 (ref 5–15)
BUN: 24 mg/dL — ABNORMAL HIGH (ref 8–23)
CO2: 29 mmol/L (ref 22–32)
Calcium: 9.1 mg/dL (ref 8.9–10.3)
Chloride: 105 mmol/L (ref 98–111)
Creatinine, Ser: 1.2 mg/dL — ABNORMAL HIGH (ref 0.44–1.00)
GFR, Estimated: 49 mL/min — ABNORMAL LOW (ref >60)
Glucose, Bld: 79 mg/dL (ref 70–99)
Potassium: 4.1 mmol/L (ref 3.5–5.1)
Sodium: 143 mmol/L (ref 135–145)

## 2022-03-06 LAB — GLUCOSE, CAPILLARY
Glucose-Capillary: 79 mg/dL (ref 70–99)
Glucose-Capillary: 141 mg/dL — ABNORMAL HIGH (ref 70–99)
Glucose-Capillary: 105 mg/dL — ABNORMAL HIGH (ref 70–99)
Glucose-Capillary: 103 mg/dL — ABNORMAL HIGH (ref 70–99)
Glucose-Capillary: 146 mg/dL — ABNORMAL HIGH (ref 70–99)
Glucose-Capillary: 297 mg/dL — ABNORMAL HIGH (ref 70–99)

## 2022-03-06 LAB — PROTIME-INR
INR: 1 (ref 0.8–1.2)
Prothrombin Time: 13 seconds (ref 11.4–15.2)

## 2022-03-06 LAB — LIPOPROTEIN A (LPA): Lipoprotein (a): 81.2 nmol/L — ABNORMAL HIGH (ref <75.0)

## 2022-03-06 SURGERY — CANCELLED PROCEDURE

## 2022-03-06 MED ORDER — SODIUM CHLORIDE 0.9 % IV SOLN: INTRAVENOUS | Status: DC

## 2022-03-06 MED ORDER — INSULIN ASPART PROT & ASPART (70-30 MIX) 100 UNIT/ML ~~LOC~~ SUSP: 40.0000 [IU] | Freq: Every day | SUBCUTANEOUS | Status: DC

## 2022-03-06 MED ORDER — LOSARTAN POTASSIUM 25 MG PO TABS
25.0000 mg | ORAL_TABLET | Freq: Every day | ORAL | Status: DC
  Administered 2022-03-07: 25 mg via ORAL
  Filled 2022-03-06 (×2): qty 1

## 2022-03-06 MED ORDER — POTASSIUM CHLORIDE CRYS ER 20 MEQ PO TBCR: 20.0000 meq | EXTENDED_RELEASE_TABLET | Freq: Every day | ORAL | Status: DC

## 2022-03-06 MED ORDER — INSULIN ASPART PROT & ASPART (70-30 MIX) 100 UNIT/ML ~~LOC~~ SUSP
30.0000 [IU] | Freq: Once | SUBCUTANEOUS | Status: DC
  Filled 2022-03-06: qty 10

## 2022-03-06 MED ORDER — INSULIN ASPART PROT & ASPART (70-30 MIX) 100 UNIT/ML ~~LOC~~ SUSP
30.0000 [IU] | Freq: Two times a day (BID) | SUBCUTANEOUS | Status: DC
  Administered 2022-03-06 – 2022-03-07 (×2): 30 [IU] via SUBCUTANEOUS
  Filled 2022-03-06: qty 10

## 2022-03-06 MED ORDER — ONDANSETRON HCL 4 MG/2ML IJ SOLN
4.0000 mg | Freq: Four times a day (QID) | INTRAMUSCULAR | Status: DC | PRN
  Administered 2022-03-06 – 2022-03-07 (×3): 4 mg via INTRAVENOUS
  Filled 2022-03-06 (×3): qty 2

## 2022-03-06 MED ORDER — FUROSEMIDE 40 MG PO TABS: 40.0000 mg | ORAL_TABLET | Freq: Every day | ORAL | Status: DC

## 2022-03-06 MED ORDER — APIXABAN 5 MG PO TABS
5.0000 mg | ORAL_TABLET | Freq: Two times a day (BID) | ORAL | Status: DC
  Administered 2022-03-06 – 2022-03-07 (×3): 5 mg via ORAL
  Filled 2022-03-06 (×3): qty 1

## 2022-03-06 NOTE — Progress Notes (Signed)
Asymptomatic but rapid ventricular rate Afib. Low normal blood pressure. While it started last night around 10 PM, unclear if she could have had any Afib in the past but unaware. Recommend TEE guided cardioversion.    Nigel Mormon, MD Pager: [phone] Office: [phone]

## 2022-03-06 NOTE — Progress Notes (Signed)
ANTICOAGULATION CONSULT NOTE - Initial Consult  Pharmacy Consult for apixaban  Indication: atrial fibrillation  Allergies  Allergen Reactions   Januvia [Sitagliptin]     Chest pains   Tramadol Other (See Comments)    hallucinations    Patient Measurements: Height: '5\' 4"'$  (162.6 cm) Weight: 110.2 kg (243 lb) IBW/kg (Calculated) : 54.7   Vital Signs: Temp: 98.3 F (36.8 C) (09/01 0728) Temp Source: Oral (09/01 0728) BP: 97/54 (09/01 1003) Pulse Rate: 111 (09/01 1003)  Labs: Recent Labs    03/04/22 1318 03/05/22 0306 03/06/22 0453  HGB 12.3 11.9*  --   HCT 40.2 38.7  --   PLT 186 194  --   CREATININE 1.05* 1.23* 1.20*    Estimated Creatinine Clearance: 53.7 mL/min (A) (by C-G formula based on SCr of 1.2 mg/dL (H)).   Medical History: Past Medical History:  Diagnosis Date   Adenomatous colon polyp    Anemia    Anxiety    Asthma    CHF (congestive heart failure) (HCC)    COPD (chronic obstructive pulmonary disease) ([place])    Diabetes mellitus without complication (HCC)    High cholesterol    Hypertension    Left knee injury    Pneumonia    Sleep apnea    Supplemental oxygen dependent    2L [place]     Assessment: 70yof s/p cath now in Afib RVR 100s.   Start apixaban age < 1, wt > 60kg and Cr < 1.5  Cbc stable yesterday  will check in am   Goal of Therapy:   Monitor platelets by anticoagulation protocol: Yes   Plan:  Apixaban '5mg'$  BID Monitor s/s bleeding   Bonnita Nasuti Pharm.D. CPP, BCPS Clinical Pharmacist [phone] 03/06/2022 10:31 AM

## 2022-03-06 NOTE — Progress Notes (Signed)
Converted to A-fib on telemetry with HR in the 90s.  Asymptomatic.  EKG obtained.  Dr. Marlowe Sax notified.  Will continue to monitor.  Jodell Cipro

## 2022-03-06 NOTE — Care Management Important Message (Signed)
Important Message  Patient Details  Name: Heidi [name] MRN: [id number] Date of Birth: [birth year]   Medicare Important Message Given:  Yes     Shelda Altes 03/06/2022, 12:15 PM

## 2022-03-06 NOTE — Plan of Care (Signed)

## 2022-03-06 NOTE — Anesthesia Preprocedure Evaluation (Signed)
Anesthesia Evaluation  Patient identified by MRN, date of birth, ID band Patient awake    Reviewed: Allergy & Precautions, NPO status , Patient's Chart, lab work & pertinent test results, reviewed documented beta blocker date and time   Airway Mallampati: III  TM Distance: >3 FB Neck ROM: Full    Dental no notable dental hx.    Pulmonary asthma , sleep apnea , COPD,  oxygen dependent,  Home o2 2LPM [place]  Needs outpt sleep study    Pulmonary exam normal breath sounds clear to auscultation       Cardiovascular hypertension, Pt. on medications and Pt. on home beta blockers +CHF  Normal cardiovascular exam+ dysrhythmias Atrial Fibrillation  Rhythm:Regular Rate:Normal  presented to the ED with shortness of breath and chest pain, prior to that was seen in the ED on 8/9 with chest pain and shortness of breath as well, CTA chest was negative for PE, noted moderate multivessel coronary calcification and cardiomegaly. -In the ED she was noted to be tachypneic, high-sensitivity troponins were negative x2, chest x-ray noted cardiomegaly and an interval decrease in pulmonary vascular congestion S/p L heart cath yesterday with nonobstructive CAD  Went into Afib RVR overnight    Neuro/Psych  Headaches, PSYCHIATRIC DISORDERS Anxiety Depression    GI/Hepatic Neg liver ROS, GERD  Medicated and Controlled,  Endo/Other  diabetes, Poorly Controlled, Type 2, Insulin DependentMorbid obesityBMI 42 FS 105 this AM  Renal/GU Renal Insufficiency and CRFRenal disease Cr 1.2  negative genitourinary   Musculoskeletal  (+) Arthritis , Osteoarthritis,    Abdominal (+) + obese,   Peds  Hematology negative hematology ROS (+)   Anesthesia Other Findings   Reproductive/Obstetrics negative OB ROS                             Anesthesia Physical Anesthesia Plan  ASA: 4  Anesthesia Plan: General   Post-op Pain Management:     Induction: Intravenous  PONV Risk Score and Plan: TIVA and Treatment may vary due to age or medical condition  Airway Management Planned: Natural Airway and Mask  Additional Equipment: None  Intra-op Plan:   Post-operative Plan:   Informed Consent: I have reviewed the patients History and Physical, chart, labs and discussed the procedure including the risks, benefits and alternatives for the proposed anesthesia with the patient or authorized representative who has indicated his/her understanding and acceptance.     Dental advisory given  Plan Discussed with: CRNA  Anesthesia Plan Comments:         Anesthesia Quick Evaluation

## 2022-03-06 NOTE — Progress Notes (Signed)
Pt presented to endo in [facility] for her tee/cardioversion.  Per monitor, pt appears to have converted at approximately 1240.  Confirmed with EKG.  Dr Virgina Jock aware, case canceled and pt transferred back to [phone].  Vista Lawman RN

## 2022-03-06 NOTE — Progress Notes (Signed)
   03/06/22 0511  Assess: MEWS Score  Temp 97.7 F (36.5 C)  BP 110/63  MAP (mmHg) 79  Pulse Rate 80  ECG Heart Rate (!) 116  Resp 20  SpO2 94 %  O2 Device Nasal Cannula  O2 Flow Rate (L/min) 3 L/min  Assess: MEWS Score  MEWS Temp 0  MEWS Systolic 0  MEWS Pulse 2  MEWS RR 0  MEWS LOC 0  MEWS Score 2  MEWS Score Color Yellow  Assess: if the MEWS score is Yellow or Red  Were vital signs taken at a resting state? Yes  Focused Assessment Change from prior assessment (see assessment flowsheet)  Does the patient meet 2 or more of the SIRS criteria? Yes  Does the patient have a confirmed or suspected source of infection? No  MEWS guidelines implemented *See Row Information* Yes  Treat  MEWS Interventions Escalated (See documentation below)  Pain Scale 0-10  Pain Score 0  Patients Stated Pain Goal 1  Take Vital Signs  Increase Vital Sign Frequency  Yellow: Q 2hr X 2 then Q 4hr X 2, if remains yellow, continue Q 4hrs  Escalate  MEWS: Escalate Yellow: discuss with charge nurse/RN and consider discussing with provider and RRT  Notify: Charge Nurse/RN  Name of Charge Nurse/RN Notified Chanda Busing, RN  Date Charge Nurse/RN Notified 03/06/22  Time Charge Nurse/RN Notified 0515  Notify: Provider  Provider Name/Title Dr. Shela Leff  Date Provider Notified 03/06/22  Time Provider Notified 0515  Method of Notification Page  Notification Reason Change in status (A-fib/RVR)  Provider response No new orders  Date of Provider Response 03/06/22  Time of Provider Response [phone]  Document  Patient Outcome Other (Comment) (Pt stable and asymptomatic, despite increased and irregular HR)  Progress note created (see row info) Yes  Assess: SIRS CRITERIA  SIRS Temperature  0  SIRS Pulse 1  SIRS Respirations  0  SIRS WBC 1  SIRS Score Sum  2

## 2022-03-06 NOTE — Progress Notes (Signed)
Mobility Specialist Progress Note    03/06/22 1230  Mobility  Activity Contraindicated/medical hold   RN advised d/t rhythm. Will f/u as schedule permits.   Hildred Alamin Mobility Specialist

## 2022-03-06 NOTE — Progress Notes (Signed)
HR up to 140s while pt OOB to bathroom.  Stated she felt "funny" when asked.  Advised pt to lie down and rest.  HR 110s-120s at rest; still A-fib on telemetry.  CBG checked per pt request.  CBG 79.  Cranberry juice and graham crackers given.  Dr. Marlowe Sax notified of the above.  No new orders received.  Will continue to monitor.  Jodell Cipro

## 2022-03-06 NOTE — Progress Notes (Addendum)
PROGRESS NOTE    Heidi [name]  AYT:[id number] DOB: July 05, 1951 DOA: 03/02/2022 PCP: Ladell Pier, MD  70/F with history of COPD on home O2, chronic diastolic CHF, type 2 diabetes mellitus, anxiety, morbid obesity, OSA, dyslipidemia presented to the ED with shortness of breath and chest pain, prior to that was seen in the ED on 8/9 with chest pain and shortness of breath as well, CTA chest was negative for PE, noted moderate multivessel coronary calcification and cardiomegaly. -In the ED she was noted to be tachypneic, high-sensitivity troponins were negative x2, chest x-ray noted cardiomegaly and an interval decrease in pulmonary vascular congestion -Treated with diuretics, underwent left heart cath which showed no obstructive CAD -8/31 went into rapid A-fib  Subjective: -Tired, mild chest discomfort, heart rate in the 120s this morning in A-fib  Assessment and Plan:  Recurrent chest pain Coronary calcifications noted on CT chest -HStroponins negative to X2  -multiple cardiac risk factors including diabetes and tobacco use -Underwent left heart cath, no obstructive CAD noted, recommended long-acting nitrates, will hold nitrates today with soft BP and A-fib RVR  Chronic respiratory failure with hypoxia History of COPD -She was started on steroids and DuoNebs on admission -Taper off steroids, continue DuoNebs, Singulair, Claritin, wean O2 down to baseline 2 L -Needs outpatient sleep study  A-fib RVR -Continue atenolol, start Eliquis, discussed with cardiology, plan for cardioversion today  Acute on chronic diastolic CHF  Pulmonary hypertension -Chest x-ray with decreasing pulmonary vascular congestion, EF last noted to be 65 to 70% with indeterminate diastolic filling pressures in 2021 -Repeat echo with preserved EF, moderate LVH -Urine output has been unreliable clinically appears euvolemic, BP has been soft, restart oral Lasix tomorrow  Uncontrolled diabetes mellitus  type 2, with hyperglycemia -CBGs poorly controlled on steroids -Improving on increased regimen of NovoLog 70/30 mix -Now CBGs still running lower, decreased insulin dose   Essential hypertension -Continue atenolol  Migraine headaches Chronic.  Reports having neck pain and headaches while at home.  This seems to be more so chronic issue for which she is followed by Dr. Jaynee Eagles -Continue home medication regimen Topamax and Nurtec as needed   Hyperlipidemia -Continue atorvastatin   Morbid obesity Likely has sleep apnea. BMI 42.05 kg/m.       DVT prophylaxis: Lovenox Advance Care Planning:   Code Status: Full Code  Family Communication: Discussed with patient in detail, no family at bedside Disposition Plan: Home tomorrow  Consultants: Cards   Procedures: No obstructive CAD  Antimicrobials:    Objective: Vitals:   03/06/22 0728 03/06/22 0921 03/06/22 1003 03/06/22 1254  BP: (!) 100/56  (!) 97/54 126/78  Pulse: (!) 126 (!) 133 (!) 111 89  Resp: 20 18  (!) 22  Temp: 98.3 F (36.8 C)   97.8 F (36.6 C)  TempSrc: Oral   Temporal  SpO2: 95% 99%  96%  Weight:      Height:        Intake/Output Summary (Last 24 hours) at 03/06/2022 1543 Last data filed at 03/05/2022 2120 Gross per 24 hour  Intake 240 ml  Output --  Net 240 ml   Filed Weights   03/04/22 0500 03/05/22 0442 03/06/22 0511  Weight: 112.4 kg 109.5 kg 110.2 kg    Examination:  General exam: Appears calm and comfortable  Respiratory system: Clear to auscultation Cardiovascular system: S1 & S2 heard, RRR.  Abd: nondistended, soft and nontender.Normal bowel sounds heard. Central nervous system: Alert and oriented. No focal neurological  deficits. Extremities: no edema Skin: No rashes Psychiatry:  Mood & affect appropriate.     Data Reviewed:   CBC: Recent Labs  Lab 03/02/22 1752 03/02/22 2327 03/03/22 0551 03/04/22 1318 03/05/22 0306  WBC 8.5  --  7.7 7.1 8.4  NEUTROABS  --   --  5.6  --   --    HGB 12.3 12.9 12.8 12.3 11.9*  HCT 41.5 38.0 42.1 40.2 38.7  MCV 93.3  --  91.1 90.5 89.6  PLT 208  --  208 186 160   Basic Metabolic Panel: Recent Labs  Lab 03/02/22 1752 03/02/22 2327 03/03/22 0551 03/04/22 1318 03/05/22 0306 03/06/22 0453  NA 140 141 140 139 140 143  K 4.0 4.0 4.0 4.0 4.1 4.1  CL 101  --  100 96* 102 105  CO2 30  --  30 30 32 29  GLUCOSE 79  --  181* 350* 220* 79  BUN 10  --  10 21 26* 24*  CREATININE 1.00  --  0.96 1.05* 1.23* 1.20*  CALCIUM 9.3  --  9.3 9.1 9.0 9.1  MG  --   --  1.9 2.0  --   --   PHOS  --   --  3.5  --   --   --    GFR: Estimated Creatinine Clearance: 53.7 mL/min (A) (by C-G formula based on SCr of 1.2 mg/dL (H)). Liver Function Tests: Recent Labs  Lab 03/03/22 0551  AST 20  ALT 20  ALKPHOS 99  BILITOT 0.5  PROT 6.6  ALBUMIN 3.7   No results for input(s): "LIPASE", "AMYLASE" in the last 168 hours. No results for input(s): "AMMONIA" in the last 168 hours. Coagulation Profile: Recent Labs  Lab 03/06/22 1141  INR 1.0   Cardiac Enzymes: No results for input(s): "CKTOTAL", "CKMB", "CKMBINDEX", "TROPONINI" in the last 168 hours. BNP (last 3 results) No results for input(s): "PROBNP" in the last 8760 hours. HbA1C: No results for input(s): "HGBA1C" in the last 72 hours.  CBG: Recent Labs  Lab 03/06/22 0452 03/06/22 0727 03/06/22 0952 03/06/22 1348 03/06/22 1452  GLUCAP 79 141* 105* 103* 146*   Lipid Profile: No results for input(s): "CHOL", "HDL", "LDLCALC", "TRIG", "CHOLHDL", "LDLDIRECT" in the last 72 hours. Thyroid Function Tests: No results for input(s): "TSH", "T4TOTAL", "FREET4", "T3FREE", "THYROIDAB" in the last 72 hours. Anemia Panel: No results for input(s): "VITAMINB12", "FOLATE", "FERRITIN", "TIBC", "IRON", "RETICCTPCT" in the last 72 hours. Urine analysis:    Component Value Date/Time   COLORURINE YELLOW 01/26/2021 2308   APPEARANCEUR CLEAR 01/26/2021 2308   LABSPEC 1.023 01/26/2021 2308    PHURINE 6.0 01/26/2021 2308   GLUCOSEU >=500 (A) 01/26/2021 2308   HGBUR NEGATIVE 01/26/2021 2308   BILIRUBINUR NEGATIVE 01/26/2021 2308   BILIRUBINUR Negative 05/12/2016 [place] 01/26/2021 2308   PROTEINUR NEGATIVE 01/26/2021 2308   UROBILINOGEN 0.2 04/25/2019 1508   NITRITE NEGATIVE 01/26/2021 2308   LEUKOCYTESUR NEGATIVE 01/26/2021 2308   Sepsis Labs: '@LABRCNTIP'$ (procalcitonin:4,lacticidven:4)  )No results found for this or any previous visit (from the past 240 hour(s)).   Radiology Studies: No results found.   Scheduled Meds:  apixaban  5 mg Oral BID   atenolol  25 mg Oral Daily   atorvastatin  40 mg Oral QPM   empagliflozin  10 mg Oral Daily   guaiFENesin  600 mg Oral BID   insulin aspart  0-15 Units Subcutaneous TID WC   insulin aspart  0-5 Units Subcutaneous QHS  insulin aspart protamine- aspart  30 Units Subcutaneous Once   [START ON 03/07/2022] insulin aspart protamine- aspart  40 Units Subcutaneous Q breakfast   insulin aspart protamine- aspart  50 Units Subcutaneous Q supper   losartan  25 mg Oral Daily   mometasone-formoterol  2 puff Inhalation BID   montelukast  10 mg Oral QHS   sodium chloride flush  3 mL Intravenous Q12H   sodium chloride flush  3 mL Intravenous Q12H   sodium chloride flush  3 mL Intravenous Q12H   topiramate  75 mg Oral BID   Continuous Infusions:  sodium chloride       LOS: 3 days    Time spent: 33mn   PDomenic Polite MD [facility]   03/06/2022, 3:43 PM

## 2022-03-07 DIAGNOSIS — J9611 Chronic respiratory failure with hypoxia: Secondary | ICD-10-CM | POA: Diagnosis not present

## 2022-03-07 LAB — BASIC METABOLIC PANEL
Anion gap: 11 (ref 5–15)
BUN: 28 mg/dL — ABNORMAL HIGH (ref 8–23)
CO2: 31 mmol/L (ref 22–32)
Calcium: 9 mg/dL (ref 8.9–10.3)
Chloride: 100 mmol/L (ref 98–111)
Creatinine, Ser: 1.26 mg/dL — ABNORMAL HIGH (ref 0.44–1.00)
GFR, Estimated: 46 mL/min — ABNORMAL LOW (ref >60)
Glucose, Bld: 147 mg/dL — ABNORMAL HIGH (ref 70–99)
Potassium: 4 mmol/L (ref 3.5–5.1)
Sodium: 142 mmol/L (ref 135–145)

## 2022-03-07 LAB — CBC
HCT: 43 % (ref 36.0–46.0)
Hemoglobin: 13.4 g/dL (ref 12.0–15.0)
MCH: 28.2 pg (ref 26.0–34.0)
MCHC: 31.2 g/dL (ref 30.0–36.0)
MCV: 90.3 fL (ref 80.0–100.0)
Platelets: 229 10*3/uL (ref 150–400)
RBC: 4.76 MIL/uL (ref 3.87–5.11)
RDW: 15.3 % (ref 11.5–15.5)
WBC: 8.9 10*3/uL (ref 4.0–10.5)
nRBC: 0 % (ref 0.0–0.2)

## 2022-03-07 LAB — GLUCOSE, CAPILLARY
Glucose-Capillary: 141 mg/dL — ABNORMAL HIGH (ref 70–99)
Glucose-Capillary: 149 mg/dL — ABNORMAL HIGH (ref 70–99)
Glucose-Capillary: 165 mg/dL — ABNORMAL HIGH (ref 70–99)
Glucose-Capillary: 187 mg/dL — ABNORMAL HIGH (ref 70–99)

## 2022-03-07 MED ORDER — HUMALOG MIX 75/25 (75-25) 100 UNIT/ML ~~LOC~~ SUSP: SUBCUTANEOUS | Status: DC

## 2022-03-07 MED ORDER — FUROSEMIDE 40 MG PO TABS
40.0000 mg | ORAL_TABLET | Freq: Every day | ORAL | Status: DC
  Administered 2022-03-07: 40 mg via ORAL
  Filled 2022-03-07: qty 1

## 2022-03-07 MED ORDER — APIXABAN 5 MG PO TABS: 5.0000 mg | ORAL_TABLET | Freq: Two times a day (BID) | ORAL | 0 refills | Status: DC

## 2022-03-07 MED ORDER — EMPAGLIFLOZIN 10 MG PO TABS: 10.0000 mg | ORAL_TABLET | Freq: Every day | ORAL | 0 refills | Status: DC

## 2022-03-07 MED ORDER — DILTIAZEM HCL ER COATED BEADS 120 MG PO CP24: 120.0000 mg | ORAL_CAPSULE | Freq: Every day | ORAL | 0 refills | Status: DC

## 2022-03-07 MED ORDER — LOSARTAN POTASSIUM 50 MG PO TABS: 25.0000 mg | ORAL_TABLET | Freq: Every day | ORAL | Status: DC

## 2022-03-07 MED ORDER — DILTIAZEM HCL ER COATED BEADS 120 MG PO CP24
120.0000 mg | ORAL_CAPSULE | Freq: Every day | ORAL | Status: DC
  Administered 2022-03-07: 120 mg via ORAL
  Filled 2022-03-07: qty 1

## 2022-03-07 NOTE — Discharge Summary (Signed)
Physician Discharge Summary  Heidi [name] TGG:[id number] DOB: 04/20/1951 DOA: 03/02/2022  PCP: Ladell Pier, MD  Admit date: 03/02/2022 Discharge date: 03/07/2022  Time spent: 35 minutes  Recommendations for Outpatient Follow-up:  PCP in 1 week, please titrate insulin dose at follow-up Will need medication assistance for longer term anticoagulation(eliquis), 30-day card given for now   Discharge Diagnoses:  Chest pain Paroxysmal atrial fibrillation Acute on chronic diastolic CHF   Chronic respiratory failure with hypoxia (HCC)   Moderate persistent asthma   Chest pain   Heart failure with preserved ejection fraction (HCC)   DM type 2 causing CKD stage 2 (Heidi [name])   Essential hypertension   Migraines   HLD (hyperlipidemia)   Morbid obesity due to excess calories ([place]) complicated by hbp/ dm / hyperlipidemia   Unstable angina [facility])   Discharge Condition: stable  Diet recommendation: Low-sodium, diabetic, heart healthy  Filed Weights   03/05/22 0442 03/06/22 0511 03/07/22 0600  Weight: 109.5 kg 110.2 kg 110 kg    History of present illness:  71/F with history of COPD on home O2, chronic diastolic CHF, type 2 diabetes mellitus, anxiety, morbid obesity, OSA, dyslipidemia presented to the ED with shortness of breath and chest pain, prior to that was seen in the ED on 8/9 with chest pain and shortness of breath as well, CTA chest was negative for PE, noted moderate multivessel coronary calcification and cardiomegaly. -In the ED she was noted to be tachypneic, high-sensitivity troponins were negative x2, chest x-ray noted cardiomegaly and an interval decrease in pulmonary vascular congestion -Treated with diuretics, underwent left heart cath which showed no obstructive CAD -8/31 went into rapid [facility] Course:   Recurrent chest pain Coronary calcifications noted on CT chest -HStroponins negative to X2  -multiple cardiac risk factors including diabetes and tobacco  use -Underwent left heart cath, no obstructive CAD noted   Chronic respiratory failure with hypoxia History of COPD, on 2 L home O2 at baseline, followed by pulmonary -She was started on steroids and DuoNebs on admission -Taper off steroids, continue DuoNebs, Singulair, Claritin, wean O2 down to baseline 2 L -Needs outpatient sleep study   A-fib RVR -Noted to be in rapid A-fib from 8/31 night into 9/1 AM  -continue atenolol, cardiology planned cardioversion yesterday afternoon, before procedure she converted to sinus rhythm -Started on Eliquis this admission, cost is a concern with patient being in donut hole, 30-day card given, will need further assistance -Also started on low-dose Cardizem  Acute on chronic diastolic CHF  Pulmonary hypertension -Chest x-ray with decreasing pulmonary vascular congestion, EF last noted to be 65 to 70% with indeterminate diastolic filling pressures in 2021 -Repeat echo with preserved EF, moderate LVH -Diuresed with IV Lasix, volume status has improved, now euvolemic, Lasix 40 Mg daily resumed at discharge -Also started on Jardiance and low-dose ARB continued   Uncontrolled diabetes mellitus type 2, with hyperglycemia -CBGs poorly controlled  -Wide fluctuation in blood sugars noted with hypo as well as severe hyperglycemia noted this admission, insulin 70/30 dose decreased, will need further titration and follow-up   Essential hypertension -Continue atenolol   Migraine headaches Chronic.  Reports having neck pain and headaches while at home.  This seems to be more so chronic issue for which she is followed by Dr. Jaynee Eagles -Continue home medication regimen Topamax and Nurtec as needed   Hyperlipidemia -Continue atorvastatin   Morbid obesity Likely has sleep apnea. BMI 42.05 kg/m.      Procedures: Left  heart cath: No obstructive CAD Consultations: Cardiology Dr. Virgina Jock  Discharge Exam: Vitals:   03/07/22 0757 03/07/22 0825  BP: 124/80  124/80  Pulse: 92 94  Resp: 17 18  Temp: 97.9 F (36.6 C)   SpO2: 96% 95%   General exam: Appears calm and comfortable  Respiratory system: Clear to auscultation Cardiovascular system: S1 & S2 heard, RRR.  Abd: nondistended, soft and nontender.Normal bowel sounds heard. Central nervous system: Alert and oriented. No focal neurological deficits. Extremities: no edema Skin: No rashes Psychiatry:  Mood & affect appropriate.    Discharge Instructions   Discharge Instructions     Diet - low sodium heart healthy   Complete by: As directed    Diet Carb Modified   Complete by: As directed    Increase activity slowly   Complete by: As directed       Allergies as of 03/07/2022       Reactions   Januvia [sitagliptin]    Chest pains   Tramadol Other (See Comments)   hallucinations        Medication List     STOP taking these medications    aspirin 81 MG chewable tablet       TAKE these medications    acetaminophen 500 MG tablet Commonly known as: TYLENOL Take 500-1,000 mg by mouth every 6 (six) hours as needed (pain).   albuterol (2.5 MG/3ML) 0.083% nebulizer solution Commonly known as: PROVENTIL USE 1 VIAL VIA NEBULIZER EVERY 6 HOURS AS NEEDED FOR WHEEZING OR SHORTNESS OF BREATH What changed:  how much to take how to take this when to take this reasons to take this additional instructions   albuterol 108 (90 Base) MCG/ACT inhaler Commonly known as: VENTOLIN HFA Inhale 1-2 puffs into the lungs every 4 (four) hours as needed for wheezing or shortness of breath. What changed: Another medication with the same name was changed. Make sure you understand how and when to take each.   apixaban 5 MG Tabs tablet Commonly known as: ELIQUIS Take 1 tablet (5 mg total) by mouth 2 (two) times daily.   atenolol 25 MG tablet Commonly known as: TENORMIN TAKE 1 TABLET(25 MG) BY MOUTH DAILY What changed: See the new instructions.   atorvastatin 40 MG tablet Commonly  known as: LIPITOR TAKE 1 TABLET BY MOUTH DAILY AT 6 PM What changed: when to take this   Botox 200 units injection Generic drug: botulinum toxin Type A Provider to inject 155 units into the muscles of the head and neck every 12 weeks. Discard remainder.   diltiazem 120 MG 24 hr capsule Commonly known as: CARDIZEM CD Take 1 capsule (120 mg total) by mouth daily.   empagliflozin 10 MG Tabs tablet Commonly known as: JARDIANCE Take 1 tablet (10 mg total) by mouth daily. Start taking on: March 08, 2022   famotidine 20 MG tablet Commonly known as: PEPCID Take 1 tablet (20 mg total) by mouth daily.   fluticasone-salmeterol 100-50 MCG/ACT Aepb Commonly known as: Advair Diskus Inhale 1 puff into the lungs 2 (two) times daily.   furosemide 40 MG tablet Commonly known as: LASIX TAKE 1 TABLET(40 MG) BY MOUTH DAILY What changed:  how much to take how to take this when to take this additional instructions   HumaLOG Mix 75/25 (75-25) 100 UNIT/ML Susp injection Generic drug: insulin lispro protamine-lispro Inject 50 units in the morning and 40 units in the evening. What changed: additional instructions   loratadine 10 MG tablet Commonly known as:  CLARITIN Take 1 tablet (10 mg total) by mouth daily as needed for allergies.   losartan 50 MG tablet Commonly known as: COZAAR Take 0.5 tablets (25 mg total) by mouth daily. What changed: See the new instructions.   montelukast 10 MG tablet Commonly known as: SINGULAIR Take 1 tablet (10 mg total) by mouth at bedtime.   multivitamin with minerals Tabs tablet Take 1 tablet by mouth in the morning. Centrum Silver   nitroGLYCERIN 0.3 MG SL tablet Commonly known as: NITROSTAT DISSOLVE 1 TABLET UNDER THE  TONGUE EVERY 5 MINUTES AS NEEDED FOR CHEST PAIN. MAX OF 3 TABLETS IN 15 MINUTES. CALL 911 IF PAIN  PERSISTS. What changed: See the new instructions.   Nurtec 75 MG Tbdp Generic drug: Rimegepant Sulfate Take 75 mg by mouth daily as  needed. For migraines. Take as close to onset of migraine as possible. One daily maximum. What changed:  when to take this additional instructions   OXYGEN Inhale 2 L into the lungs continuous.   Potassium Chloride ER 20 MEQ Tbcr TAKE 1 TABLET BY MOUTH DAILY What changed: how much to take   tetrahydrozoline-zinc 0.05-0.25 % ophthalmic solution Commonly known as: VISINE-AC Place 1 drop into both eyes 3 (three) times daily as needed (dry / irritated eyes).   topiramate 25 MG capsule Commonly known as: TOPAMAX Take 3 capsules (75 mg total) by mouth 2 (two) times daily.       Allergies  Allergen Reactions   Januvia [Sitagliptin]     Chest pains   Tramadol Other (See Comments)    hallucinations      The results of significant diagnostics from this hospitalization (including imaging, microbiology, ancillary and laboratory) are listed below for reference.    Significant Diagnostic Studies: CARDIAC CATHETERIZATION  Result Date: 03/04/2022 Images from the original result were not included. LM: Normal LAD: Medial calcification, minimal luminal irregularities Lcx: Normal RCA: Normal Mildly elevated LVEDP Complex procedure due to severe tortuosity requiring the use of long sheath and multiple catheters. No obstructive CAD. Given nitrate responsive pain, consider using long acting nitrate, as it could help with coronary vasospasm or endothelial dysfunction. Nigel Mormon, MD Pager: [phone] Office: [phone]   ECHOCARDIOGRAM COMPLETE  Result Date: 03/03/2022    ECHOCARDIOGRAM REPORT   Patient Name:   Heidi [name] Date of Exam: 03/03/2022 Medical Rec #:  [id number]     Height:       64.0 in Accession #:    [id number]    Weight:       245.0 lb Date of Birth:  May 22, 1951     BSA:          2.133 m Patient Age:    71 years      BP:           139/64 mmHg Patient Gender: F             HR:           98 bpm. Exam Location:  Inpatient Procedure: 2D Echo, Cardiac Doppler and Color  Doppler Indications:    Chest pain  History:        Patient has prior history of Echocardiogram examinations, most                 recent 08/25/2019. CHF, COPD; Signs/Symptoms:Shortness of Breath.  Sonographer:    Clayton Lefort RDCS (AE) Referring Phys: [id number] [facility] A SMITH  Sonographer Comments: Patient is obese and suboptimal subcostal window. Image acquisition challenging due to  patient body habitus. IMPRESSIONS  1. Left ventricular ejection fraction, by estimation, is 60 to 65%. The left ventricle has normal function. The left ventricle has no regional wall motion abnormalities. There is moderate left ventricular hypertrophy. Left ventricular diastolic parameters were normal.  2. Right ventricular systolic function is normal. The right ventricular size is normal.  3. The mitral valve is normal in structure. No evidence of mitral valve regurgitation. No evidence of mitral stenosis.  4. The aortic valve is normal in structure. Aortic valve regurgitation is not visualized. No aortic stenosis is present. Comparison(s): Changes from prior study are noted. LVH is increased from mild to moderate. Tribial pericardial effusion is new. FINDINGS  Left Ventricle: Left ventricular ejection fraction, by estimation, is 60 to 65%. The left ventricle has normal function. The left ventricle has no regional wall motion abnormalities. The left ventricular internal cavity size was small. There is moderate  left ventricular hypertrophy. Left ventricular diastolic parameters were normal. Right Ventricle: The right ventricular size is normal. No increase in right ventricular wall thickness. Right ventricular systolic function is normal. Left Atrium: Left atrial size was normal in size. Right Atrium: Right atrial size was normal in size. Pericardium: Trivial pericardial effusion is present. Mitral Valve: The mitral valve is normal in structure. No evidence of mitral valve regurgitation. No evidence of mitral valve stenosis. Tricuspid  Valve: The tricuspid valve is normal in structure. Tricuspid valve regurgitation is not demonstrated. No evidence of tricuspid stenosis. Aortic Valve: The aortic valve is normal in structure. Aortic valve regurgitation is not visualized. No aortic stenosis is present. Aortic valve mean gradient measures 2.0 mmHg. Aortic valve peak gradient measures 4.1 mmHg. Aortic valve area, by VTI measures 2.91 cm. Pulmonic Valve: The pulmonic valve was normal in structure. Pulmonic valve regurgitation is not visualized. No evidence of pulmonic stenosis. Aorta: The aortic root is normal in size and structure. IAS/Shunts: The interatrial septum was not assessed.  LEFT VENTRICLE PLAX 2D LVIDd:         3.70 cm LVIDs:         2.40 cm LV PW:         1.60 cm LV IVS:        1.60 cm LVOT diam:     2.10 cm LV SV:         51 LV SV Index:   24 LVOT Area:     3.46 cm  RIGHT VENTRICLE             IVC RV Basal diam:  2.80 cm     IVC diam: 1.00 cm RV S prime:     15.40 cm/s TAPSE (M-mode): 2.2 cm LEFT ATRIUM           Index        RIGHT ATRIUM           Index LA diam:      3.20 cm 1.50 cm/m   RA Area:     13.60 cm LA Vol (A2C): 30.6 ml 14.35 ml/m  RA Volume:   28.90 ml  13.55 ml/m LA Vol (A4C): 45.7 ml 21.43 ml/m  AORTIC VALVE AV Area (Vmax):    2.87 cm AV Area (Vmean):   2.78 cm AV Area (VTI):     2.91 cm AV Vmax:           101.00 cm/s AV Vmean:          69.200 cm/s AV VTI:  0.174 m AV Peak Grad:      4.1 mmHg AV Mean Grad:      2.0 mmHg LVOT Vmax:         83.80 cm/s LVOT Vmean:        55.500 cm/s LVOT VTI:          0.146 m LVOT/AV VTI ratio: 0.84  AORTA Ao Root diam: 3.30 cm Ao Asc diam:  2.80 cm  SHUNTS Systemic VTI:  0.15 m Systemic Diam: 2.10 cm Vernell Leep MD Electronically signed by Vernell Leep MD Signature Date/Time: 03/03/2022/12:11:29 PM    Final    DG Chest 1 View  Result Date: 03/02/2022 CLINICAL DATA:  Chest pain, shortness of breath EXAM: CHEST  1 VIEW COMPARISON:  Previous studies including the  chest radiographs and CT done on 02/11/2022 FINDINGS: Transverse diameter of heart is increased. There is better inspiration. There is prominence of central pulmonary vessels without signs of alveolar pulmonary edema. There are small linear densities in the lower lung fields. There is improvement in the aeration of lower lung fields. There is no focal pulmonary consolidation. Lateral CP angles are clear. There is no pneumothorax. IMPRESSION: Cardiomegaly. There is interval decrease in pulmonary vascular congestion. Linear densities in the lower lung fields may suggest subsegmental atelectasis. There are no signs of alveolar pulmonary edema or focal pulmonary consolidation. Electronically Signed   By: Elmer Picker M.D.   On: 03/02/2022 18:42   CT Angio Chest PE W and/or Wo Contrast  Result Date: 02/11/2022 CLINICAL DATA:  Chest pain or SOB, pleurisy or effusion suspected. EXAM: CT ANGIOGRAPHY CHEST WITH CONTRAST TECHNIQUE: Multidetector CT imaging of the chest was performed using the standard protocol during bolus administration of intravenous contrast. Multiplanar CT image reconstructions and MIPs were obtained to evaluate the vascular anatomy. RADIATION DOSE REDUCTION: This exam was performed according to the departmental dose-optimization program which includes automated exposure control, adjustment of the mA and/or kV according to patient size and/or use of iterative reconstruction technique. CONTRAST:  17m OMNIPAQUE IOHEXOL 350 MG/ML SOLN COMPARISON:  01/27/2021 FINDINGS: Cardiovascular: There is adequate opacification of the pulmonary arterial tree. No intraluminal filling defect is identified to suggest acute pulmonary embolism. Central pulmonary arteries are enlarged in keeping with changes of pulmonary arterial hypertension. Moderate multi-vessel coronary artery calcification. Mild global cardiomegaly. Trace pericardial effusion, similar to prior examination. Mild atherosclerotic calcification  within the thoracic aorta. No aortic aneurysm. Mediastinum/Nodes: No pathologic thoracic adenopathy. Esophagus unremarkable. Lungs/Pleura: Mild bibasilar atelectasis. Lungs are otherwise clear. No pneumothorax or pleural effusion. Upper Abdomen: No acute abnormality. Partially visualized exophytic cystic lesion arising from the upper pole of the left kidney demonstrating mild capsular calcification again noted measuring at least 6.9 cm in greatest dimension. This is incompletely assessed on this exam Musculoskeletal: No chest wall abnormality. No acute or significant osseous findings. Review of the MIP images confirms the above findings. IMPRESSION: 1. No pulmonary embolism. 2. Moderate multi-vessel coronary artery calcification. Mild global cardiomegaly. Trace pericardial effusion. 3. Morphologic changes in keeping with pulmonary arterial hypertension. Electronically Signed   By: AFidela SalisburyM.D.   On: 02/11/2022 04:18   DG Chest 2 View  Result Date: 02/11/2022 CLINICAL DATA:  Chest pain and shortness of breath. EXAM: CHEST - 2 VIEW COMPARISON:  Radiograph 10/09/2021 FINDINGS: Mild cardiomegaly. Stable mediastinal contours. Vascular congestion. Mild peribronchial thickening may be bronchitic or congestive. There may be small pleural effusions. Streaky atelectasis at the right lung base. No pneumothorax. Detailed assessment limited by habitus.  IMPRESSION: Mild cardiomegaly with vascular congestion. Peribronchial thickening may be bronchitic or congestive. Possible small pleural effusions. Electronically Signed   By: Keith Rake M.D.   On: 02/11/2022 00:56    Microbiology: No results found for this or any previous visit (from the past 240 hour(s)).   Labs: Basic Metabolic Panel: Recent Labs  Lab 03/03/22 0551 03/04/22 1318 03/05/22 0306 03/06/22 0453 03/07/22 0406  NA 140 139 140 143 142  K 4.0 4.0 4.1 4.1 4.0  CL 100 96* 102 105 100  CO2 30 30 32 29 31  GLUCOSE 181* 350* 220* 79 147*   BUN 10 21 26* 24* 28*  CREATININE 0.96 1.05* 1.23* 1.20* 1.26*  CALCIUM 9.3 9.1 9.0 9.1 9.0  MG 1.9 2.0  --   --   --   PHOS 3.5  --   --   --   --    Liver Function Tests: Recent Labs  Lab 03/03/22 0551  AST 20  ALT 20  ALKPHOS 99  BILITOT 0.5  PROT 6.6  ALBUMIN 3.7   No results for input(s): "LIPASE", "AMYLASE" in the last 168 hours. No results for input(s): "AMMONIA" in the last 168 hours. CBC: Recent Labs  Lab 03/02/22 1752 03/02/22 2327 03/03/22 0551 03/04/22 1318 03/05/22 0306 03/07/22 0239  WBC 8.5  --  7.7 7.1 8.4 8.9  NEUTROABS  --   --  5.6  --   --   --   HGB 12.3 12.9 12.8 12.3 11.9* 13.4  HCT 41.5 38.0 42.1 40.2 38.7 43.0  MCV 93.3  --  91.1 90.5 89.6 90.3  PLT 208  --  208 186 194 229   Cardiac Enzymes: No results for input(s): "CKTOTAL", "CKMB", "CKMBINDEX", "TROPONINI" in the last 168 hours. BNP: BNP (last 3 results) Recent Labs    05/11/21 1957 02/11/22 0100 03/02/22 2318  BNP 11.3 48.8 12.4    ProBNP (last 3 results) No results for input(s): "PROBNP" in the last 8760 hours.  CBG: Recent Labs  Lab 03/06/22 1452 03/06/22 2105 03/07/22 0015 03/07/22 0421 03/07/22 0801  GLUCAP 146* 297* 165* 141* 149*       Signed:  Domenic Polite MD.  [facility] 03/07/2022, 11:35 AM

## 2022-03-07 NOTE — Progress Notes (Signed)
Discharge instructions, RX's and follow up appt explained and provided to patient verbalized understanding. Oxygen delivered to room by [facility], patient left floor via wheelchair Accompanied by staff no c/o pain or shortness of breath at d/c.   Noal Abshier, Tivis Ringer, RN

## 2022-03-07 NOTE — TOC Transition Note (Signed)
Transition of Care [facility]) - CM/SW Discharge Note   Patient Details  Name: Heidi [name] MRN: [id number] Date of Birth: Jul 15, 1950  [facility] CM/SW Contact:  Konrad Penta, RN Phone Number: [phone] 03/07/2022, 11:29 AM   Clinical Narrative:   [name] to transition home today. Wears home oxygen prior to admission with [facility]. [name] states she will need portable oxygen tank from [place] because she is not sure if her son or daughter will pick her up and they will not be able to bring portable oxygen. [name] states she lives with her daughter. Provided Eliquis 30-day free card. Mrs. Wheat is in donut hole. Has insurance. Unable to provide to [facility] since patient she has insurance. Advised [name] to follow up with [facility] to inquire about pharmacy assistance. Office closed today on Saturday. Costs of Jardiance and Elquis is 92.97 and 98.36.  MD aware. [name] made aware of cost.  Called [facility] to request portable tank be delivered to patient's room prior to admission.  Made nursing aware.    Final next level of care: Home/Self Care Barriers to Discharge: No Barriers Identified   Patient Goals and CMS Choice Patient states their goals for this hospitalization and ongoing recovery are:: return home CMS Medicare.gov Compare Post Acute Care list provided to:: Patient Choice offered to / list presented to : Patient  Discharge Placement                       Discharge Plan and Services                DME Arranged: Oxygen (wears home 02 prior to admission) DME Agency: [employer] [facility] Date DME Agency Contacted: 03/07/22 Time DME Agency Contacted: [id number] Representative spoke with at DME Agency: [place] ([place]) Interventions     Readmission Risk Interventions     No data to display

## 2022-03-07 NOTE — Plan of Care (Signed)
  Problem: Education: Goal: Ability to describe self-care measures that may prevent or decrease complications (Diabetes Survival Skills Education) will improve Outcome: Adequate for Discharge Goal: Individualized Educational Video(s) Outcome: Adequate for Discharge   Problem: Coping: Goal: Ability to adjust to condition or change in health will improve Outcome: Adequate for Discharge   Problem: Fluid Volume: Goal: Ability to maintain a balanced intake and output will improve Outcome: Adequate for Discharge   Problem: Health Behavior/Discharge Planning: Goal: Ability to identify and utilize available resources and services will improve Outcome: Adequate for Discharge Goal: Ability to manage health-related needs will improve Outcome: Adequate for Discharge   Problem: Metabolic: Goal: Ability to maintain appropriate glucose levels will improve Outcome: Adequate for Discharge   Problem: Nutritional: Goal: Maintenance of adequate nutrition will improve Outcome: Adequate for Discharge Goal: Progress toward achieving an optimal weight will improve Outcome: Adequate for Discharge   Problem: Skin Integrity: Goal: Risk for impaired skin integrity will decrease Outcome: Adequate for Discharge   Problem: Tissue Perfusion: Goal: Adequacy of tissue perfusion will improve Outcome: Adequate for Discharge   Problem: Education: Goal: Understanding of CV disease, CV risk reduction, and recovery process will improve Outcome: Adequate for Discharge Goal: Individualized Educational Video(s) Outcome: Adequate for Discharge   Problem: Activity: Goal: Ability to return to baseline activity level will improve Outcome: Adequate for Discharge   Problem: Cardiovascular: Goal: Ability to achieve and maintain adequate cardiovascular perfusion will improve Outcome: Adequate for Discharge Goal: Vascular access site(s) Level 0-1 will be maintained Outcome: Adequate for Discharge   Problem: Health  Behavior/Discharge Planning: Goal: Ability to safely manage health-related needs after discharge will improve Outcome: Adequate for Discharge   Problem: Education: Goal: Knowledge of General Education information will improve Description: Including pain rating scale, medication(s)/side effects and non-pharmacologic comfort measures Outcome: Adequate for Discharge   Problem: Health Behavior/Discharge Planning: Goal: Ability to manage health-related needs will improve Outcome: Adequate for Discharge   Problem: Clinical Measurements: Goal: Ability to maintain clinical measurements within normal limits will improve Outcome: Adequate for Discharge Goal: Will remain free from infection Outcome: Adequate for Discharge Goal: Diagnostic test results will improve Outcome: Adequate for Discharge Goal: Respiratory complications will improve Outcome: Adequate for Discharge Goal: Cardiovascular complication will be avoided Outcome: Adequate for Discharge   Problem: Activity: Goal: Risk for activity intolerance will decrease Outcome: Adequate for Discharge   Problem: Nutrition: Goal: Adequate nutrition will be maintained Outcome: Adequate for Discharge   Problem: Coping: Goal: Level of anxiety will decrease Outcome: Adequate for Discharge   Problem: Elimination: Goal: Will not experience complications related to bowel motility Outcome: Adequate for Discharge Goal: Will not experience complications related to urinary retention Outcome: Adequate for Discharge   Problem: Pain Managment: Goal: General experience of comfort will improve Outcome: Adequate for Discharge   Problem: Safety: Goal: Ability to remain free from injury will improve Outcome: Adequate for Discharge   Problem: Skin Integrity: Goal: Risk for impaired skin integrity will decrease Outcome: Adequate for Discharge

## 2022-03-07 NOTE — Progress Notes (Signed)
Subjective:  Patient feels well, states that she is ready for going home, denies dyspnea or chest pain.  Intake/Output from previous day:  I/O last 3 completed shifts: In: 240 [P.O.:240] Out: 450 [Urine:450] No intake/output data recorded. Net IO Since Admission: -231.67 mL [03/07/22 1106]  Blood pressure 124/80, pulse 94, temperature 97.9 F (36.6 C), temperature source Oral, resp. rate 18, height 5' 4" (1.626 m), weight 110 kg, SpO2 95 %. Physical Exam Constitutional:      Appearance: She is obese.  Neck:     Vascular: No carotid bruit or JVD.  Cardiovascular:     Rate and Rhythm: Normal rate and regular rhythm.     Pulses: Intact distal pulses.     Heart sounds: Normal heart sounds. No murmur heard.    No gallop.  Pulmonary:     Effort: Pulmonary effort is normal.     Breath sounds: Normal breath sounds.  Abdominal:     General: Bowel sounds are normal.     Palpations: Abdomen is soft.  Musculoskeletal:     Right lower leg: No edema.     Left lower leg: No edema.     Lab Results: BMP BNP (last 3 results) Recent Labs    05/11/21 1957 02/11/22 0100 03/02/22 2318  BNP 11.3 48.8 12.4    Lab Results  Component Value Date   NA 142 03/07/2022   K 4.0 03/07/2022   CO2 31 03/07/2022   GLUCOSE 147 (H) 03/07/2022   BUN 28 (H) 03/07/2022   CREATININE 1.26 (H) 03/07/2022   CALCIUM 9.0 03/07/2022   EGFR 63 08/21/2021   GFRNONAA 46 (L) 03/07/2022       Latest Ref Rng & Units 03/07/2022    4:06 AM 03/06/2022    4:53 AM 03/05/2022    3:06 AM  BMP  Glucose 70 - 99 mg/dL 147  79  220   BUN 8 - 23 mg/dL _0 Creatinine 0.44 - 1.00 mg/dL 1.26  1.20  1.23   Sodium 135 - 145 mmol/L 142  143  140   Potassium 3.5 - 5.1 mmol/L 4.0  4.1  4.1   Chloride 98 - 111 mmol/L 100  105  102   CO2 22 - 32 mmol/L 31  29  32   Calcium 8.9 - 10.3 mg/dL 9.0  9.1  9.0       Latest Ref Rng & Units 03/03/2022    5:51 AM 08/21/2021    3:22 PM 01/26/2021   11:17 PM  Hepatic Function   Total Protein 6.5 - 8.1 g/dL 6.6  6.5  6.1   Albumin 3.5 - 5.0 g/dL 3.7  4.4  3.4   AST 15 - 41 U/L _1 ALT 0 - 44 U/L _2 Alk Phosphatase 38 - 126 U/L 99  134  119   Total Bilirubin 0.3 - 1.2 mg/dL 0.5  <0.2  0.2       Latest Ref Rng & Units 03/07/2022    2:39 AM 03/05/2022    3:06 AM 03/04/2022    1:18 PM  CBC  WBC 4.0 - 10.5 K/uL 8.9  8.4  7.1   Hemoglobin 12.0 - 15.0 g/dL 13.4  11.9  12.3   Hematocrit 36.0 - 46.0 % 43.0  38.7  40.2   Platelets 150 - 400 K/uL 229  194  186    Lipid Panel  Component Value Date/Time   CHOL 120 08/21/2021 1522   TRIG 91 08/21/2021 1522   HDL 53 08/21/2021 1522   CHOLHDL 2.3 08/21/2021 1522   CHOLHDL 2.6 01/28/2021 0523   VLDL 30 01/28/2021 0523   LDLCALC 50 08/21/2021 1522   Cardiac Panel (last 3 results) No results for input(s): "CKTOTAL", "CKMB", "TROPONINI", "RELINDX" in the last 72 hours.  HEMOGLOBIN A1C Lab Results  Component Value Date   HGBA1C 9.8 (H) 03/03/2022   MPG 234.56 03/03/2022   TSH No results for input(s): "TSH" in the last 8760 hours.  Imaging:  CXR 03/02/2022: Cardiomegaly. There is interval decrease in pulmonary vascular congestion. Linear densities in the lower lung fields may suggest subsegmental atelectasis. There are no signs of alveolar pulmonary edema or focal pulmonary consolidation  Cardiac Studies:  EKG 03/06/2022: Normal sinus rhythm at the rate of 91 bpm, normal axis, poor R progression, cannot exclude anteroseptal infarct old.  Low-voltage complexes.  Consider pulmonary disease pattern.  Echocardiogram 03/04/2022:  1. Left ventricular ejection fraction, by estimation, is 60 to 65%. The left ventricle has normal function. The left ventricle has no regional wall motion abnormalities. There is moderate left ventricular hypertrophy. Left ventricular diastolic  parameters were normal.  2. Right ventricular systolic function is normal. The right ventricular size is normal.  3. The  mitral valve is normal in structure. No evidence of mitral valve regurgitation. No evidence of mitral stenosis.  4. The aortic valve is normal in structure. Aortic valve regurgitation is not visualized. No aortic stenosis is present.  Comparison(s): Changes from prior study 08/25/2019 are noted. LVH is increased from mild to moderate. Tribial pericardial effusion is new.  Left Heart Catheterization 03/04/2022: LM: Normal LAD: Medial calcification, minimal luminal irregularities Lcx: Normal RCA: Normal   Mildly elevated LVEDP   Complex procedure due to severe tortuosity requiring the use of long sheath and multiple catheters. No obstructive CAD. Given nitrate responsive pain, consider using long acting nitrate, as it could help with coronary vasospasm or endothelial dysfunction.   Mobile cardiac telemetry 6 days 02/10/2021 - 03/20/2021: Dominant rhythm: Sinus. HR 64-127 bpm. Avg HR 85 bpm, in sinus rhythm. 2 episodes of SVT, fastest and longest at 121 bpm for 6 beats <1% isolated SVE, couplet/triplets. 0 episodes of VT. <1% isolated VE, no couplet/triplets. No atrial fibrillation/atrial flutter/VT/high grade AV block, sinus pause >3sec noted. 1 patient triggered event correlated with sinus rhythm, artifact  Lexiscan Tetrofosmin stress test 12/09/2019: Lexiscan nuclear stress test performed using 1-day protocol. Stress EKG is non-diagnostic, as this is pharmacological stress test using Lexiscan. Myocardial perfusion imaging is normal. Stress LVEF 74% Low risk study.  Scheduled Meds:  apixaban  5 mg Oral BID   atenolol  25 mg Oral Daily   atorvastatin  40 mg Oral QPM   diltiazem  120 mg Oral Daily   empagliflozin  10 mg Oral Daily   furosemide  40 mg Oral Daily   guaiFENesin  600 mg Oral BID   insulin aspart  0-5 Units Subcutaneous QHS   insulin aspart protamine- aspart  30 Units Subcutaneous BID WC   losartan  25 mg Oral Daily   mometasone-formoterol  2 puff Inhalation BID    montelukast  10 mg Oral QHS   sodium chloride flush  3 mL Intravenous Q12H   sodium chloride flush  3 mL Intravenous Q12H   sodium chloride flush  3 mL Intravenous Q12H   topiramate  75 mg Oral BID   Continuous Infusions:  sodium chloride     PRN Meds:.sodium chloride, acetaminophen **OR** acetaminophen, acetaminophen, albuterol, loratadine, methocarbamol, naphazoline-glycerin, ondansetron (ZOFRAN) IV, sodium chloride flush  Assessment   1.  Paroxysmal atrial fibrillation CHA2DS2-VASc Score is 5.  Yearly risk of stroke: 7% (A, F, HTN, DM, CHF).  Score of 1=0.6; 2=2.2; 3=3.2; 4=4.8; 5=7.2; 6=9.8; 7=>9.8) -(CHF; HTN; vasc disease DM,  Female = 1; Age <65 =0; 65-74 = 1,  >75 =2; stroke/embolism= 2).   2.  Chest pain with normal coronary arteries, nitrate responsive, may suggest endothelial dysfunction in a patient with multiple cardiovascular risk factors that includes hypertension, hyperlipidemia, diabetes mellitus.  Plan:   Heidi [name] is a 71 y.o. female with medical history significant of COPD on supplemental oxygen, hyperlipidemia, diastolic CHF, diabetes mellitus type 2 with stage IIIa chronic kidney disease, anxiety, OSA, and morbid obesity who initially brought presented with complaints of shortness of breath and chest pain on 03/02/2022.  Cardiac catheterization revealed no significant coronary disease on 03/04/2022, patient then developed A-fib with RVR, plan cardioversion canceled as patient spontaneously converted to sinus rhythm.  The following day.  Presently tolerating anticoagulation.  Presently doing well, no acute decompensated heart failure, she is ready to be discharged home on anticoagulation, and is maintaining sinus rhythm.  We will follow her up in the outpatient basis.  Patient states that she feels well.    Adrian Prows, MD, [facility] 03/07/2022, 11:06 AM Office: [phone] Fax: [phone] Pager: [phone]

## 2022-03-08 DIAGNOSIS — I480 Paroxysmal atrial fibrillation: Secondary | ICD-10-CM

## 2022-03-10 ENCOUNTER — Telehealth: Payer: Self-pay | Admitting: *Deleted

## 2022-03-10 NOTE — Patient Outreach (Signed)
  Care Coordination [facility] Note Transition Care Management Unsuccessful Follow-up Telephone Call  Date of discharge and from where:  [facility] [phone]  Attempts:  1st Attempt  Reason for unsuccessful TCM follow-up call:  Left voice message  .[place] Care Management [phone]

## 2022-03-11 ENCOUNTER — Telehealth: Payer: Self-pay | Admitting: *Deleted

## 2022-03-11 ENCOUNTER — Telehealth: Payer: Self-pay | Admitting: Pharmacist

## 2022-03-11 DIAGNOSIS — J9611 Chronic respiratory failure with hypoxia: Secondary | ICD-10-CM

## 2022-03-11 NOTE — Progress Notes (Signed)
[place] [facility])  [place] Team    03/11/2022  Heidi [name] 10/11/1950 [id number]  Reason for referral: Medication Assistance  Referral source: [facility] RN Current insurance: [facility]  PMHx includes but not limited to:  Paroxysmal atrial fibrillation    Outreach:  Unsuccessful telephone call with Heidi [name].  HIPAA compliant voice mail left requesting a call back.   Subjective:  Patient needs assistance with Eliquis due to cost. Based on [place] [employer] insurance, cost should be $47/30 day supply. Per [facility] Practice Assist portal, patient is a recipient Low Income Subsidy (LIS) Level 4. Patient receives extra help for Part D premiums and Part D cost sharing and may have a deductible. Patient has a 15 percent co-insurance - in cases where the plan's copays are less than the co-insurance, the lesser of logic applies.  Objective:  Lab Results  Component Value Date   CREATININE 1.26 (H) 03/07/2022   CREATININE 1.20 (H) 03/06/2022   CREATININE 1.23 (H) 03/05/2022    Lab Results  Component Value Date   HGBA1C 9.8 (H) 03/03/2022    Lipid Panel     Component Value Date/Time   CHOL 120 08/21/2021 1522   TRIG 91 08/21/2021 1522   HDL 53 08/21/2021 1522   CHOLHDL 2.3 08/21/2021 1522   CHOLHDL 2.6 01/28/2021 0523   VLDL 30 01/28/2021 0523   LDLCALC 50 08/21/2021 1522    BP Readings from Last 3 Encounters:  03/07/22 124/80  02/11/22 (!) 130/45  11/07/21 118/70    Allergies  Allergen Reactions   Januvia [Sitagliptin]     Chest pains   Tramadol Other (See Comments)    hallucinations   Plan: Will follow-up in 1 to 3 business days.  Loretha Brasil, PharmD [place] Pharmacist Office: [phone]

## 2022-03-11 NOTE — Patient Outreach (Signed)
  Care Coordination [facility] Note Transition Care Management Follow-up Telephone Call Date of discharge and from where: [facility] [phone] How have you been since you were released from the hospital? N Any questions or concerns? Yes I need to get an apartment that I don't have to go up 14-15 steps Items Reviewed: Did the pt receive and understand the discharge instructions provided? Yes  Medications obtained and verified? Yes  Other? No  Any new allergies since your discharge? No  Dietary orders reviewed? No Do you have support at home? Yes   Home Care and Equipment/Supplies: Were home health services ordered? no If so, what is the name of the agency? N   Has the agency set up a time to come to the patient's home? not applicable Were any new equipment or medical supplies ordered?  No What is the name of the medical supply agency? N/a Were you able to get the supplies/equipment? not applicable Do you have any questions related to the use of the equipment or supplies? No  Functional Questionnaire: (I = Independent and D = Dependent) ADLs: I  Bathing/Dressing- I  Meal Prep- I  Eating- I  Maintaining continence- I  Transferring/Ambulation- I  Managing Meds- I  Follow up appointments reviewed:  PCP Hospital f/u appt confirmed? No   Specialist Hospital f/u appt confirmed? No   Are transportation arrangements needed? No  If their condition worsens, is the pt aware to call PCP or go to the Emergency Dept.? Yes Was the patient provided with contact information for the PCP's office or ED? Yes Was to pt encouraged to call back with questions or concerns? y  SDOH assessments and interventions completed:   Yes  Care Coordination Interventions Activated:  Yes   Care Coordination Interventions:  PCP follow up appointment requested , Referred for housing, Referred to pharmacy.  Encounter Outcome:  Pt. Visit Completed

## 2022-03-12 ENCOUNTER — Telehealth: Payer: Self-pay

## 2022-03-12 NOTE — Telephone Encounter (Signed)
   Telephone encounter was:  Unsuccessful.  03/12/2022 Name: Heidi [name] MRN: [id number] DOB: [birth year]  Unsuccessful outbound call made today to assist with:   housing.  Outreach Attempt:  1st Attempt  A HIPAA compliant voice message was left requesting a return call.  Instructed patient to call back at [phone].  [place] Resource Care Guide   [email or web address]  ?? [phone]   Website: [email or web address]  [email or web address]  "We don't say no, we SHOW how!"         The [facility] Health Department

## 2022-03-13 NOTE — Progress Notes (Signed)
[place] [facility])                                            [place] Team    03/13/2022  Heidi [name] January 12, 1951 [id number]  Reviewed patient's case with [facility] Medication Assistance Specialist and learned that patient can apply for assistance with Eliquis as well as Jardiance. Placed return call to Ms. [place] to make her aware that [facility] is able to assist her with the application process. Left voicemail on patient's phone for a call back.    Loretha Brasil, PharmD [place] Pharmacist Office: [phone]

## 2022-03-13 NOTE — Progress Notes (Signed)
[place] [facility])                                            [place] Team    03/13/2022  Heidi [name] 1951-05-06 [id number]  Placed 2nd telephonic outreach attempt to [name] to attempt to discuss patient assistance with Eliquis. Left a HIPAA compliant voicemail for patient to return my call.   I will attempt final outreach in 1 to 3 business days.   Loretha Brasil, PharmD [place] Pharmacist Office: [phone]

## 2022-03-13 NOTE — Progress Notes (Signed)
Heidi [name])  Heidi [name]    03/13/2022  Heidi [name] 1951-03-01 [id number]  Reason for referral: Medication Assistance  Referral source: [facility] RN Current insurance: Heidi [name]  PMHx includes but not limited to:  AFIB  Outreach:  Successful telephone call with Heidi [name].  HIPAA identifiers verified.   Objective: The ASCVD Risk score (Arnett DK, et al., 2019) failed to calculate for the following reasons:   The valid total cholesterol range is 130 to 320 mg/dL  Lab Results  Component Value Date   CREATININE 1.26 (H) 03/07/2022   CREATININE 1.20 (H) 03/06/2022   CREATININE 1.23 (H) 03/05/2022    Lab Results  Component Value Date   HGBA1C 9.8 (H) 03/03/2022    Lipid Panel     Component Value Date/Time   CHOL 120 08/21/2021 1522   TRIG 91 08/21/2021 1522   HDL 53 08/21/2021 1522   CHOLHDL 2.3 08/21/2021 1522   CHOLHDL 2.6 01/28/2021 0523   VLDL 30 01/28/2021 0523   LDLCALC 50 08/21/2021 1522    BP Readings from Last 3 Encounters:  03/07/22 124/80  02/11/22 (!) 130/45  11/07/21 118/70    Allergies  Allergen Reactions   Januvia [Sitagliptin]     Chest pains   Tramadol Other (See Comments)    hallucinations    Medication Assistance Findings:    Extra Help:  Already receiving Partial Extra Help Low Income Subsidy  Patient Assistance Programs: Patient is ineligible to apply for patient assistance due to being a recipient of level Heidi [name]. I have encouraged patient to reapply to the Medicare Extra Help program for a re-evaluation. I made patient aware that it can take up to 30 days for a decision to be made. Patient verbalized understanding.     Plan: Will route note to Heidi Shock, RN.  Will close Heidi [name] pharmacy case as no further medication needs identified at this time.  Am happy to assist in the future as needed.    Heidi [name], PharmD [place] Pharmacist Office: [phone]

## 2022-03-16 ENCOUNTER — Telehealth: Payer: Self-pay

## 2022-03-16 ENCOUNTER — Inpatient Hospital Stay: Payer: Medicare Other

## 2022-03-16 DIAGNOSIS — I480 Paroxysmal atrial fibrillation: Secondary | ICD-10-CM | POA: Diagnosis not present

## 2022-03-16 NOTE — Progress Notes (Unsigned)
Patient came in for [place] patch event monitor placement, also plaint of right forearm pain after recent cardiac catheterization.  Patient seen and examined, do not see any hematoma, radial pulses preserved.  I reassured her.  Advised her to take Tylenol on a as needed basis.  PAF (paroxysmal atrial fibrillation) ([place]) - Plan: LONG TERM MONITOR (3-14 DAYS)

## 2022-03-16 NOTE — Telephone Encounter (Signed)
   Telephone encounter was:  Unsuccessful.  03/16/2022 Name: Heidi [name] MRN: [id number] DOB: [birth year]  Unsuccessful outbound call made today to assist with:   housing.  Outreach Attempt:  2nd Attempt  A HIPAA compliant voice message was left requesting a return call.  Instructed patient to call back at [phone].  [place] Resource Care Guide   [name]  ?? [phone]   Website: [email or web address]  "We don't say no, we SHOW how!"         [facility] Health Department

## 2022-03-17 NOTE — Progress Notes (Signed)
[place] [facility])  [place] Team    03/17/2022  Heidi [name] Oct 29, 1950 [id number]  Reason for referral: Medication Assistance with Eliquis and Jardiance  Referral source: [facility] RN Current insurance: [employer]  PMHx includes but not limited to:  paroxysmal atrial fibrillation; History of CHF (congestive heart failure)   Outreach:  Successful telephone call with [name].  HIPAA identifiers verified.   Objective: The ASCVD Risk score (Arnett DK, et al., 2019) failed to calculate for the following reasons:   The valid total cholesterol range is 130 to 320 mg/dL  Lab Results  Component Value Date   CREATININE 1.26 (H) 03/07/2022   CREATININE 1.20 (H) 03/06/2022   CREATININE 1.23 (H) 03/05/2022    Lab Results  Component Value Date   HGBA1C 9.8 (H) 03/03/2022    Lipid Panel     Component Value Date/Time   CHOL 120 08/21/2021 1522   TRIG 91 08/21/2021 1522   HDL 53 08/21/2021 1522   CHOLHDL 2.3 08/21/2021 1522   CHOLHDL 2.6 01/28/2021 0523   VLDL 30 01/28/2021 0523   LDLCALC 50 08/21/2021 1522    BP Readings from Last 3 Encounters:  03/07/22 124/80  02/11/22 (!) 130/45  11/07/21 118/70    Allergies  Allergen Reactions   Januvia [Sitagliptin]     Chest pains   Tramadol Other (See Comments)    hallucinations   Assessment: Discussed with patient that we will need proof of household income, typically the social security benefit verification letter and an out of pocket expense report from [employer]. Informed patient that [place] requires that patient spends 3% of household income on prescription medications processed through her Part D plan. Confirmed patient's mailing address and informed that the partially completed applications will be mailed to her home. Patient expressed desire to fax signed forms and out of pocket expense report back to [facility]. Fax number provided to patient. [phone].Patient states  that she will attempt to schedule a follow up appointment with her PCP today for a future appointment in order to follow up from hospitalization and newly prescribed medications Eliquis and Jardiance to get prescription refills.   I personally placed a call to [place] ([place], [phone]) and requested the out of pocket expense report from 07/06/2021 to 03/17/2022. Representative stated it was printed and ready for pick up. Called Heidi [name] back and left a voicemail that she can pick up at her convenience.    Medication Assistance Findings:  Medication assistance needs identified: Eliquis and Jardiance   Extra Help:  Already receiving Partial Extra Help Low Income Subsidy  Patient Assistance Programs: Eliquis made by [place] requirement met: Yes Out-of-pocket prescription expenditure met:   Unknown Reviewed program requirements with patient.   Patient has been instructed to pick up an out of pocket expense report from her pharmacy for the 2023 year; [facility] has this for her Informed patient that a LIS letter may be required   Jardiance made by [place] requirement met: Yes Out-of-pocket prescription expenditure met:   Not Applicable Reviewed program requirements with patient.   Informed patient that a LIS letter may be required   Plan: I will route patient assistance letter to [place] technician who will coordinate patient assistance program application process for medications listed above.  [facility] pharmacy technician will assist with obtaining all required documents from both patient and provider(s) and submit application(s) once completed.   Loretha Brasil, PharmD  [place] Pharmacist Office: [phone]

## 2022-03-18 ENCOUNTER — Ambulatory Visit: Payer: Medicare Other | Admitting: Nurse Practitioner

## 2022-03-18 ENCOUNTER — Telehealth: Payer: Self-pay | Admitting: Pharmacy Technician

## 2022-03-18 ENCOUNTER — Encounter: Payer: Self-pay | Admitting: Nurse Practitioner

## 2022-03-18 VITALS — BP 130/80 | HR 84 | Ht 64.0 in | Wt 239.6 lb

## 2022-03-18 DIAGNOSIS — J4540 Moderate persistent asthma, uncomplicated: Secondary | ICD-10-CM

## 2022-03-18 DIAGNOSIS — J9611 Chronic respiratory failure with hypoxia: Secondary | ICD-10-CM

## 2022-03-18 DIAGNOSIS — Z596 Low income: Secondary | ICD-10-CM

## 2022-03-18 DIAGNOSIS — G4733 Obstructive sleep apnea (adult) (pediatric): Secondary | ICD-10-CM

## 2022-03-18 DIAGNOSIS — I5032 Chronic diastolic (congestive) heart failure: Secondary | ICD-10-CM | POA: Diagnosis not present

## 2022-03-18 DIAGNOSIS — E662 Morbid (severe) obesity with alveolar hypoventilation: Secondary | ICD-10-CM

## 2022-03-18 MED ORDER — BUDESONIDE-FORMOTEROL FUMARATE 80-4.5 MCG/ACT IN AERO: 2.0000 | INHALATION_SPRAY | Freq: Two times a day (BID) | RESPIRATORY_TRACT | 5 refills | Status: AC

## 2022-03-18 MED ORDER — ALBUTEROL SULFATE (2.5 MG/3ML) 0.083% IN NEBU: 2.5000 mg | INHALATION_SOLUTION | Freq: Four times a day (QID) | RESPIRATORY_TRACT | 5 refills | Status: DC | PRN

## 2022-03-18 NOTE — Assessment & Plan Note (Signed)
Compensated on current regimen. Recent dyspnea seemed to be related to volume overload from acute on chronic CHF exacerbation. She is improved today. She requested change from Advair DPI to Symbicort. She doesn't like the dry powder and has used symbicort in the past. Rx placed today.

## 2022-03-18 NOTE — Assessment & Plan Note (Signed)
She has a history of OSA/OHS and hypercapnic respiratory failure. There is some concern for PAH as well with enlarged PA on imaging. Recent echo without right heart strain and unable to measure PASP. May need to consider RHC in the future. Educated on the negative impacts untreated OSA has. She was agreeable to repeating sleep study. We will revisit PAP therapy after she completes study.

## 2022-03-18 NOTE — Progress Notes (Signed)
$'@Patient'u$  ID: Heidi [name], female    DOB: Nov 06, 1950, 71 y.o.   MRN: [id number]  Chief Complaint  Patient presents with   Follow-up    Pt hospital hospital f/u she she's normally sees Dr.Desai. She is on 2L continuous but currently using 1L.     Referring provider: Ladell Pier, MD  HPI: 71 year old female never smoker followed for asthma and chronic respiratory failure with hypoxia and hypercapnia previously on BiPAP. She had COVID 19 infection February 2022 with severe parenchymal lung injury. She is a patient of Dr. Mauricio Po and last seen in office 10/09/2021. Past medical history significant CHF, HTN, PAF, cardiomegaly, GERD, DM, OA, anxiety, depression, OSA/OHS, HLD.   She was recently hospitalized from 03/02/2022 to 03/07/2022 for acute on chronic diastolic heart failure.  She presented to the emergency room prior to her admission on 8/9 with shortness of breath and chest pain.  CTA chest was negative for PE.  She was noted to have moderate multivessel CAD and cardiomegaly.  She was treated with diuretics and discharged home.  She represented to the ED on 03/02/2022 with complaints of dyspnea x3 weeks and chest pain x3 days.  She also had noted desaturations to the 70s on room air at home.  She was treated with diuretics, underwent left heart cath which showed no obstructive CAD.  She then went into rapid A-fib on 8/31.  She was treated with diltiazem and started on Eliquis.  Cardiology plan for cardioversion however, patient converted to sinus rhythm prior to procedure.  There was some concern for underlying sleep apnea; recommendation for outpatient sleep study.  TEST/EVENTS:  10/27/2017 PFT: FVC 49, FEV1 54, ratio 87, TLC 60, DLCOunc 40. No BD 02/11/2022 CTA chest: no evidence of PE. Moderate multivessel CAD. Enlarged central pulmonary arteries. Mild bibasilar atelectasis otherwise lungs are clear.  03/02/2022 CXR: cardiomegaly. Improved inspiration.  Prominence of central pulmonary  vessels without signs of pulmonary edema.  There are small linear densities in the lower lung fields, likely atelectasis.  There is improvement of aeration in the lower lung fields.  No focal pulmonary consolidation. 03/03/2022 echo: EF 60-65%. Moderate LVH. Normal diastolic parameters. RV size and function nl.  03/04/2022 LHC: mildly elevated LVEDP. No obstructive CAD  03/18/2022: Today - follow up Patient presents today for hospital follow up. She has been feeling much better since she was discharged home. She has not felt very short of breath; back to her baseline. No significant cough, chest congestion, wheezing. Her leg swelling is stable. She has not been checking her weights consistently but she is down 1.3 kg since discharge on our scale. She is taking 40 mg of lasix daily. She is currently wearing a heart monitor and will be following up with cardiology. She is currently on Advair but she doesn't like the dry powder. She had used Symbicort at some point; she did feel like this worked well for her. She is currently on 1-2 lpm supplemental oxygen; O2 sats at home have been >90%. She is taking Eliquis Twice daily as scheduled; has not missed any doses.  She was on BiPAP in the past for OSA/OHS. She has been off of this for some time. Had trouble tolerating it. She doesn't think she still has OSA. She used to fall asleep when she was riding in the car but she's able to stay awake now. She also doesn't feel as sleepy during the day; although, she still has some daytime fatigue. She has been told she  snores. Denies any drowsy driving, morning headaches, or sleep parasomnias/paralysis.   Allergies  Allergen Reactions   Januvia [Sitagliptin]     Chest pains   Tramadol Other (See Comments)    hallucinations    Immunization History  Administered Date(s) Administered   Influenza Whole 05/11/2014   Influenza, High Dose Seasonal PF 04/08/2018, 02/25/2019   Influenza,inj,Quad PF,6+ Mos 03/26/2015,  05/12/2016, 04/18/2017   Pneumococcal Conjugate-13 01/14/2015   Pneumococcal Polysaccharide-23 12/19/2013, 11/13/2019   Tdap 11/20/2016    Past Medical History:  Diagnosis Date   Adenomatous colon polyp    Anemia    Anxiety    Asthma    CHF (congestive heart failure) (HCC)    COPD (chronic obstructive pulmonary disease) ([place])    Diabetes mellitus without complication (HCC)    High cholesterol    Hypertension    Left knee injury    Pneumonia    Sleep apnea    Supplemental oxygen dependent    2L [place]    Tobacco History: Social History   Tobacco Use  Smoking Status Never   Passive exposure: Never  Smokeless Tobacco Never   Counseling given: Not Answered   Outpatient Medications Prior to Visit  Medication Sig Dispense Refill   acetaminophen (TYLENOL) 500 MG tablet Take 500-1,000 mg by mouth every 6 (six) hours as needed (pain).     albuterol (VENTOLIN HFA) 108 (90 Base) MCG/ACT inhaler Inhale 1-2 puffs into the lungs every 4 (four) hours as needed for wheezing or shortness of breath. 8 g 5   apixaban (ELIQUIS) 5 MG TABS tablet Take 1 tablet (5 mg total) by mouth 2 (two) times daily. 60 tablet 0   atenolol (TENORMIN) 25 MG tablet TAKE 1 TABLET(25 MG) BY MOUTH DAILY (Patient taking differently: Take 25 mg by mouth daily.) 90 tablet 0   atorvastatin (LIPITOR) 40 MG tablet TAKE 1 TABLET BY MOUTH DAILY AT 6 PM (Patient taking differently: Take 40 mg by mouth every evening.) 90 tablet 2   Botulinum Toxin Type A (BOTOX) 200 units SOLR Provider to inject 155 units into the muscles of the head and neck every 12 weeks. Discard remainder. 1 each 3   diltiazem (CARDIZEM CD) 120 MG 24 hr capsule Take 1 capsule (120 mg total) by mouth daily. 30 capsule 0   empagliflozin (JARDIANCE) 10 MG TABS tablet Take 1 tablet (10 mg total) by mouth daily. 30 tablet 0   famotidine (PEPCID) 20 MG tablet Take 1 tablet (20 mg total) by mouth daily. 60 tablet 5   furosemide (LASIX) 40 MG tablet TAKE 1  TABLET(40 MG) BY MOUTH DAILY (Patient taking differently: Take 40 mg by mouth daily.) 90 tablet 1   insulin lispro protamine-lispro (HUMALOG MIX 75/25) (75-25) 100 UNIT/ML SUSP injection Inject 50 units in the morning and 40 units in the evening.     loratadine (CLARITIN) 10 MG tablet Take 1 tablet (10 mg total) by mouth daily as needed for allergies. 90 tablet 0   losartan (COZAAR) 50 MG tablet Take 0.5 tablets (25 mg total) by mouth daily.     montelukast (SINGULAIR) 10 MG tablet Take 1 tablet (10 mg total) by mouth at bedtime. 90 tablet 2   Multiple Vitamin (MULTIVITAMIN WITH MINERALS) TABS tablet Take 1 tablet by mouth in the morning. Centrum Silver     nitroGLYCERIN (NITROSTAT) 0.3 MG SL tablet DISSOLVE 1 TABLET UNDER THE  TONGUE EVERY 5 MINUTES AS NEEDED FOR CHEST PAIN. MAX OF 3 TABLETS IN 15 MINUTES. CALL  911 IF PAIN  PERSISTS. (Patient taking differently: Place 0.3 mg under the tongue every 5 (five) minutes as needed for chest pain.) 200 tablet 2   OXYGEN Inhale 2 L into the lungs continuous.     Potassium Chloride ER 20 MEQ TBCR TAKE 1 TABLET BY MOUTH DAILY (Patient taking differently: Take 20 mEq by mouth daily.) 90 tablet 3   tetrahydrozoline-zinc (VISINE-AC) 0.05-0.25 % ophthalmic solution Place 1 drop into both eyes 3 (three) times daily as needed (dry / irritated eyes).     topiramate (TOPAMAX) 25 MG capsule Take 3 capsules (75 mg total) by mouth 2 (two) times daily. 180 capsule 11   albuterol (PROVENTIL) (2.5 MG/3ML) 0.083% nebulizer solution USE 1 VIAL VIA NEBULIZER EVERY 6 HOURS AS NEEDED FOR WHEEZING OR SHORTNESS OF BREATH (Patient taking differently: Take 2.5 mg by nebulization every 6 (six) hours as needed for wheezing or shortness of breath.) 25 vial 0   fluticasone-salmeterol (ADVAIR DISKUS) 100-50 MCG/ACT AEPB Inhale 1 puff into the lungs 2 (two) times daily. 60 each 5   Rimegepant Sulfate (NURTEC) 75 MG TBDP Take 75 mg by mouth daily as needed. For migraines. Take as close to  onset of migraine as possible. One daily maximum. (Patient not taking: Reported on 03/18/2022) 16 tablet 11   No facility-administered medications prior to visit.     Review of Systems:   Constitutional: No weight loss or gain, night sweats, fevers, chills, or lassitude. +fatigue (stable) HEENT: No headaches, difficulty swallowing, tooth/dental problems, or sore throat. No sneezing, itching, ear ache, nasal congestion, or post nasal drip CV:  +swelling in lower extremities (stable). No chest pain, orthopnea, PND, anasarca, dizziness, palpitations, syncope Resp: +shortness of breath with exertion (baseline). No excess mucus or change in color of mucus. No productive or non-productive. No hemoptysis. No wheezing.  No chest wall deformity GI:  No heartburn, indigestion, abdominal pain, nausea, vomiting, diarrhea, change in bowel habits, loss of appetite, bloody stools.  GU: No dysuria, change in color of urine, urgency or frequency.  No flank pain, no hematuria  Neuro: No dizziness or lightheadedness.  Psych: No depression or anxiety. Mood stable.     Physical Exam:  BP 130/80   Pulse 84   Ht '5\' 4"'$  (1.626 m)   Wt 239 lb 9.6 oz (108.7 kg)   SpO2 93%   BMI 41.13 kg/m   GEN: Pleasant, interactive, chronically-ill appearing; morbidly obese; in no acute distress. HEENT:  Normocephalic and atraumatic. PERRLA. Sclera white. Nasal turbinates pink, moist and patent bilaterally. No rhinorrhea present. Oropharynx pink and moist, without exudate or edema. No lesions, ulcerations, or postnasal drip. Mallampati III NECK:  Supple w/ fair ROM. No JVD present.  CV: RRR, no m/r/g, +1 BLE edema. Pulses intact, +2 bilaterally. No cyanosis, pallor or clubbing. PULMONARY:  Unlabored, regular breathing. Clear bilaterally A&P w/o wheezes/rales/rhonchi. No accessory muscle use. No dullness to percussion. GI: BS present and normoactive. Soft, non-tender to palpation. No organomegaly or masses detected. MSK: No  erythema, warmth or tenderness. Cap refil <2 sec all extrem. No deformities or joint swelling noted.  Neuro: A/Ox3. No focal deficits noted.   Skin: Warm, no lesions or rashe Psych: Normal affect and behavior. Judgement and thought content appropriate.     Lab Results:  CBC    Component Value Date/Time   WBC 8.9 03/07/2022 0239   RBC 4.76 03/07/2022 0239   HGB 13.4 03/07/2022 0239   HGB 13.3 08/30/2020 1141   HCT 43.0 03/07/2022 0239  HCT 41.9 08/30/2020 1141   PLT 229 03/07/2022 0239   PLT 128 (L) 08/30/2020 1141   MCV 90.3 03/07/2022 0239   MCV 88 08/30/2020 1141   MCH 28.2 03/07/2022 0239   MCHC 31.2 03/07/2022 0239   RDW 15.3 03/07/2022 0239   RDW 15.2 08/30/2020 1141   LYMPHSABS 1.7 03/03/2022 0551   LYMPHSABS 4.3 (H) 08/23/2017 1655   MONOABS 0.3 03/03/2022 0551   EOSABS 0.2 03/03/2022 0551   EOSABS 0.4 08/23/2017 1655   BASOSABS 0.0 03/03/2022 0551   BASOSABS 0.1 08/23/2017 1655    BMET    Component Value Date/Time   NA 142 03/07/2022 0406   NA 142 08/21/2021 1522   K 4.0 03/07/2022 0406   CL 100 03/07/2022 0406   CO2 31 03/07/2022 0406   GLUCOSE 147 (H) 03/07/2022 0406   BUN 28 (H) 03/07/2022 0406   BUN 17 08/21/2021 1522   CREATININE 1.26 (H) 03/07/2022 0406   CREATININE 1.14 (H) 05/12/2016 1648   CALCIUM 9.0 03/07/2022 0406   GFRNONAA 46 (L) 03/07/2022 0406   GFRNONAA 51 (L) 05/12/2016 1648   GFRAA 56 (L) 08/30/2020 1141   GFRAA 59 (L) 05/12/2016 1648    BNP    Component Value Date/Time   BNP 12.4 03/02/2022 2318     Imaging:  CARDIAC CATHETERIZATION  Result Date: 03/04/2022 Images from the original result were not included. LM: Normal LAD: Medial calcification, minimal luminal irregularities Lcx: Normal RCA: Normal Mildly elevated LVEDP Complex procedure due to severe tortuosity requiring the use of long sheath and multiple catheters. No obstructive CAD. Given nitrate responsive pain, consider using long acting nitrate, as it could help  with coronary vasospasm or endothelial dysfunction. Nigel Mormon, MD Pager: [phone] Office: [phone]   ECHOCARDIOGRAM COMPLETE  Result Date: 03/03/2022    ECHOCARDIOGRAM REPORT   Patient Name:   CATHELEEN LANGHORNE Date of Exam: 03/03/2022 Medical Rec #:  [id number]     Height:       64.0 in Accession #:    [id number]    Weight:       245.0 lb Date of Birth:  April 20, 1951     BSA:          2.133 m Patient Age:    68 years      BP:           139/64 mmHg Patient Gender: F             HR:           98 bpm. Exam Location:  Inpatient Procedure: 2D Echo, Cardiac Doppler and Color Doppler Indications:    Chest pain  History:        Patient has prior history of Echocardiogram examinations, most                 recent 08/25/2019. CHF, COPD; Signs/Symptoms:Shortness of Breath.  Sonographer:    Clayton Lefort RDCS (AE) Referring Phys: [id number] [facility]  Sonographer Comments: Patient is obese and suboptimal subcostal window. Image acquisition challenging due to patient body habitus. IMPRESSIONS  1. Left ventricular ejection fraction, by estimation, is 60 to 65%. The left ventricle has normal function. The left ventricle has no regional wall motion abnormalities. There is moderate left ventricular hypertrophy. Left ventricular diastolic parameters were normal.  2. Right ventricular systolic function is normal. The right ventricular size is normal.  3. The mitral valve is normal in structure. No evidence of mitral valve regurgitation. No evidence of mitral  stenosis.  4. The aortic valve is normal in structure. Aortic valve regurgitation is not visualized. No aortic stenosis is present. Comparison(s): Changes from prior study are noted. LVH is increased from mild to moderate. Tribial pericardial effusion is new. FINDINGS  Left Ventricle: Left ventricular ejection fraction, by estimation, is 60 to 65%. The left ventricle has normal function. The left ventricle has no regional wall motion abnormalities. The left  ventricular internal cavity size was small. There is moderate  left ventricular hypertrophy. Left ventricular diastolic parameters were normal. Right Ventricle: The right ventricular size is normal. No increase in right ventricular wall thickness. Right ventricular systolic function is normal. Left Atrium: Left atrial size was normal in size. Right Atrium: Right atrial size was normal in size. Pericardium: Trivial pericardial effusion is present. Mitral Valve: The mitral valve is normal in structure. No evidence of mitral valve regurgitation. No evidence of mitral valve stenosis. Tricuspid Valve: The tricuspid valve is normal in structure. Tricuspid valve regurgitation is not demonstrated. No evidence of tricuspid stenosis. Aortic Valve: The aortic valve is normal in structure. Aortic valve regurgitation is not visualized. No aortic stenosis is present. Aortic valve mean gradient measures 2.0 mmHg. Aortic valve peak gradient measures 4.1 mmHg. Aortic valve area, by VTI measures 2.91 cm. Pulmonic Valve: The pulmonic valve was normal in structure. Pulmonic valve regurgitation is not visualized. No evidence of pulmonic stenosis. Aorta: The aortic root is normal in size and structure. IAS/Shunts: The interatrial septum was not assessed.  LEFT VENTRICLE PLAX 2D LVIDd:         3.70 cm LVIDs:         2.40 cm LV PW:         1.60 cm LV IVS:        1.60 cm LVOT diam:     2.10 cm LV SV:         51 LV SV Index:   24 LVOT Area:     3.46 cm  RIGHT VENTRICLE             IVC RV Basal diam:  2.80 cm     IVC diam: 1.00 cm RV S prime:     15.40 cm/s TAPSE (M-mode): 2.2 cm LEFT ATRIUM           Index        RIGHT ATRIUM           Index LA diam:      3.20 cm 1.50 cm/m   RA Area:     13.60 cm LA Vol (A2C): 30.6 ml 14.35 ml/m  RA Volume:   28.90 ml  13.55 ml/m LA Vol (A4C): 45.7 ml 21.43 ml/m  AORTIC VALVE AV Area (Vmax):    2.87 cm AV Area (Vmean):   2.78 cm AV Area (VTI):     2.91 cm AV Vmax:           101.00 cm/s AV Vmean:           69.200 cm/s AV VTI:            0.174 m AV Peak Grad:      4.1 mmHg AV Mean Grad:      2.0 mmHg LVOT Vmax:         83.80 cm/s LVOT Vmean:        55.500 cm/s LVOT VTI:          0.146 m LVOT/AV VTI ratio: 0.84  AORTA Ao Root diam: 3.30 cm Ao Asc diam:  2.80 cm  SHUNTS Systemic VTI:  0.15 m Systemic Diam: 2.10 cm Vernell Leep MD Electronically signed by Vernell Leep MD Signature Date/Time: 03/03/2022/12:11:29 PM    Final    DG Chest 1 View  Result Date: 03/02/2022 CLINICAL DATA:  Chest pain, shortness of breath EXAM: CHEST  1 VIEW COMPARISON:  Previous studies including the chest radiographs and CT done on 02/11/2022 FINDINGS: Transverse diameter of heart is increased. There is better inspiration. There is prominence of central pulmonary vessels without signs of alveolar pulmonary edema. There are small linear densities in the lower lung fields. There is improvement in the aeration of lower lung fields. There is no focal pulmonary consolidation. Lateral CP angles are clear. There is no pneumothorax. IMPRESSION: Cardiomegaly. There is interval decrease in pulmonary vascular congestion. Linear densities in the lower lung fields may suggest subsegmental atelectasis. There are no signs of alveolar pulmonary edema or focal pulmonary consolidation. Electronically Signed   By: Elmer Picker M.D.   On: 03/02/2022 18:42    botulinum toxin Type A (BOTOX) injection 155 Units     Date Action Dose Route User   Discharged on 03/07/2022   Admitted on 03/02/2022   Discharged on 02/11/2022   Admitted on 02/11/2022   01/28/2022 1600 Given 155 Units Intramuscular (Other) Melvenia Beam, MD          Latest Ref Rng & Units 10/27/2017   11:49 AM  PFT Results  FVC-Pre L 1.24   FVC-Predicted Pre % 49   FVC-Post L 1.14   FVC-Predicted Post % 45   Pre FEV1/FVC % % 86   Post FEV1/FCV % % 87   FEV1-Pre L 1.07   FEV1-Predicted Pre % 54   FEV1-Post L 1.00   DLCO uncorrected ml/min/mmHg 9.77   DLCO UNC% %  40   DLVA Predicted % 116   TLC L 3.04   TLC % Predicted % 60   RV % Predicted % 62     Lab Results  Component Value Date   NITRICOXIDE 8 09/09/2017        Assessment & Plan:   Chronic diastolic CHF (congestive heart failure) (HCC) Recent hospitalization with volume overload and acute respiratory distress related to acute on chronic respiratory failure from 03/02/2022-03/07/2022. She improved with volume correction. She is clinically improved today. Weight is stable, she is down 1.3 kg from discharge. She does have some pedal edema but reports this is stable. She is on lasix 40 mg daily. Advised her to monitor her weight. Follow up with cardiology as scheduled.   Patient Instructions  Continue Albuterol inhaler 2 puffs or 3 mL neb every 6 hours as needed for shortness of breath or wheezing. Notify if symptoms persist despite rescue inhaler/neb use. Stop Advair. Start Symbicort 2 puffs Twice daily. Brush tongue and rinse mouth afterwards Continue lasix 40 mg daily. Monitor your weight and if you gain more than 2-3 pounds in day or 5 pounds in a week, call your heart doctor Continue Eliquis 1 tab Twice daily. Monitor for excessive bruising or bleeding. If you develop severe sudden headache or if you fall, you need to go to the emergency room  Continue singulair 1 tab At bedtime  Continue supplemental oxygen 2 lpm for goal >88-90%  Follow up with cardiology as scheduled  Split night study ordered for further evaluation of your sleep apnea We discussed how untreated sleep apnea puts an individual at risk for cardiac arrhthymias, pulm HTN, DM, stroke and increases their  risk for daytime accidents.   Follow up in 4-6 weeks with Dr. Shearon Stalls. If symptoms do not improve or worsen, please contact office for sooner follow up or seek emergency care.      Chronic respiratory failure with hypoxia (HCC) Stable on current 1-2 lpm. No increased O2 requirement since discharge. Goal  >88-90%  Moderate persistent asthma without complication Compensated on current regimen. Recent dyspnea seemed to be related to volume overload from acute on chronic CHF exacerbation. She is improved today. She requested change from Advair DPI to Symbicort. She doesn't like the dry powder and has used symbicort in the past. Rx placed today.   OSA treated with BiPAP She has a history of OSA/OHS and hypercapnic respiratory failure. There is some concern for PAH as well with enlarged PA on imaging. Recent echo without right heart strain and unable to measure PASP. May need to consider RHC in the future. Educated on the negative impacts untreated OSA has. She was agreeable to repeating sleep study. We will revisit PAP therapy after she completes study.   Morbid obesity due to excess calories ([place]) complicated by hbp/ dm / hyperlipidemia Morbid obesity with BMI 41. Healthy weight loss encouraged.    I spent 42 minutes of dedicated to the care of this patient on the date of this encounter to include pre-visit review of records, face-to-face time with the patient discussing conditions above, post visit ordering of testing, clinical documentation with the electronic health record, making appropriate referrals as documented, and communicating necessary findings to members of the patients care team.  Clayton Bibles, NP 03/18/2022  Pt aware and understands NP's role.

## 2022-03-18 NOTE — Progress Notes (Signed)
Heidi [name])                                            Heidi [name]    03/18/2022  Heidi [name] [birth year] [id number]                                      Medication Assistance Referral  Referral From: Heidi [name]  Medication/Company: Heidi [name] Patient application portion:  Mailed Provider application portion: Faxed  to Dr. Karle Plumber Provider address/fax verified via: Office website  Medication/Company: Heidi [name] Patient application portion:  Mailed Provider application portion: Faxed  to Dr. Karle Plumber Provider address/fax verified via: Office website   Heidi [name] P. Elaysha Bevard, [place]  [phone]

## 2022-03-18 NOTE — Assessment & Plan Note (Signed)
Morbid obesity with BMI 41. Healthy weight loss encouraged.

## 2022-03-18 NOTE — Patient Instructions (Addendum)
Continue Albuterol inhaler 2 puffs or 3 mL neb every 6 hours as needed for shortness of breath or wheezing. Notify if symptoms persist despite rescue inhaler/neb use. Stop Advair. Start Symbicort 2 puffs Twice daily. Brush tongue and rinse mouth afterwards Continue lasix 40 mg daily. Monitor your weight and if you gain more than 2-3 pounds in day or 5 pounds in a week, call your heart doctor Continue Eliquis 1 tab Twice daily. Monitor for excessive bruising or bleeding. If you develop severe sudden headache or if you fall, you need to go to the emergency room  Continue singulair 1 tab At bedtime  Continue supplemental oxygen 2 lpm for goal >88-90%  Follow up with cardiology as scheduled  Split night study ordered for further evaluation of your sleep apnea We discussed how untreated sleep apnea puts an individual at risk for cardiac arrhthymias, pulm HTN, DM, stroke and increases their risk for daytime accidents.   Follow up in 4-6 weeks with Heidi [name]. If symptoms do not improve or worsen, please contact office for sooner follow up or seek emergency care.

## 2022-03-18 NOTE — Assessment & Plan Note (Addendum)
Recent hospitalization with volume overload and acute respiratory distress related to acute on chronic respiratory failure from 03/02/2022-03/07/2022. She improved with volume correction. She is clinically improved today. Weight is stable, she is down 1.3 kg from discharge. She does have some pedal edema but reports this is stable. She is on lasix 40 mg daily. Advised her to monitor her weight. Follow up with cardiology as scheduled.   Patient Instructions  Continue Albuterol inhaler 2 puffs or 3 mL neb every 6 hours as needed for shortness of breath or wheezing. Notify if symptoms persist despite rescue inhaler/neb use. Stop Advair. Start Symbicort 2 puffs Twice daily. Brush tongue and rinse mouth afterwards Continue lasix 40 mg daily. Monitor your weight and if you gain more than 2-3 pounds in day or 5 pounds in a week, call your heart doctor Continue Eliquis 1 tab Twice daily. Monitor for excessive bruising or bleeding. If you develop severe sudden headache or if you fall, you need to go to the emergency room  Continue singulair 1 tab At bedtime  Continue supplemental oxygen 2 lpm for goal >88-90%  Follow up with cardiology as scheduled  Split night study ordered for further evaluation of your sleep apnea We discussed how untreated sleep apnea puts an individual at risk for cardiac arrhthymias, pulm HTN, DM, stroke and increases their risk for daytime accidents.   Follow up in 4-6 weeks with Dr. Shearon Stalls. If symptoms do not improve or worsen, please contact office for sooner follow up or seek emergency care.

## 2022-03-18 NOTE — Assessment & Plan Note (Signed)
Stable on current 1-2 lpm. No increased O2 requirement since discharge. Goal >88-90%

## 2022-03-19 ENCOUNTER — Telehealth: Payer: Self-pay

## 2022-03-19 ENCOUNTER — Telehealth: Payer: Self-pay | Admitting: Licensed Clinical Social Worker

## 2022-03-19 ENCOUNTER — Telehealth: Payer: Self-pay | Admitting: Internal Medicine

## 2022-03-19 NOTE — Telephone Encounter (Signed)
   Telephone encounter was:  Successful.  03/19/2022 Name: Clarissa Laird MRN: [id number] DOB: Jan 17, 1951  Rhoda Waldvogel is a 71 y.o. year old female who is a primary care patient of Ladell Pier, MD . The community resource team was consulted for assistance with  housing.  Care guide performed the following interventions: Spoke with patient verified home address. Mailing information for [employer] low income housing, [employer] and [employer].  Patient has my name and number to call in she does not receive resource letter in the next 7-10 business days. Letter saved in Epic.  Follow Up Plan:  No further follow up planned at this time. The patient has been provided with needed resources.  [facility] Resource Care Guide   [email or web address]  ?? [phone]   Website: [email or web address]  [email or web address]  "We don't say no, we SHOW how!"         [facility]

## 2022-03-19 NOTE — Patient Outreach (Signed)
  Care Coordination   03/19/2022 Name: Heidi [name] MRN: [id number] DOB: [birth year]   Care Coordination Outreach Attempts:  An unsuccessful telephone outreach was attempted today to offer the patient information about available care coordination services as a benefit of their health plan.   Follow Up Plan:  Additional outreach attempts will be made to offer the patient care coordination information and services.   Encounter Outcome:  No Answer  Care Coordination Interventions Activated:  No   Care Coordination Interventions:  No, not indicated    Christa See, MSW, [email or web address] Phone [phone] 3:57 PM

## 2022-03-20 ENCOUNTER — Other Ambulatory Visit: Payer: Self-pay

## 2022-03-20 MED ORDER — NITROGLYCERIN 0.3 MG SL SUBL
SUBLINGUAL_TABLET | SUBLINGUAL | 2 refills | Status: DC
Start: 2022-03-20 — End: 2022-04-30

## 2022-03-23 NOTE — Telephone Encounter (Signed)
Called patient and advised her that she will need to come in and be walked in office to see if she can get POC. She was not happy with this. I advised her that we can not just write a POC order. She has Office visit next month with Dr Shearon Stalls. Nothing further needed

## 2022-03-25 DIAGNOSIS — I10 Essential (primary) hypertension: Secondary | ICD-10-CM | POA: Diagnosis not present

## 2022-03-25 DIAGNOSIS — J45909 Unspecified asthma, uncomplicated: Secondary | ICD-10-CM | POA: Diagnosis not present

## 2022-03-31 NOTE — Progress Notes (Signed)
[facility])                                            [place] Team    03/31/2022  Hayes Rehfeldt 05/07/1951 [id number]  Placed telephonic outreach to [name] regarding faxed patient assistance paperwork that was received. We are in need of the full print out she picked up from [employer], only the last page was received. A portion of the papers faxed were darker images, requesting she attempt to resend. Left voice mail for patient to return my call.   Loretha Brasil, PharmD [place] Pharmacist Office: [phone]

## 2022-04-01 ENCOUNTER — Encounter: Payer: Self-pay | Admitting: Internal Medicine

## 2022-04-01 NOTE — Progress Notes (Signed)
I received form from [facility] care management CPhT Susy Frizzle for patient assistance program patient to receive Jardiance 10 mg daily 90-day supply with 3 refills.  I have signed and dated the form. Also received a form from same recipient for patient assistance program through [employer] for Eliquis 5 mg 1 tablet twice a day for 90-day supply with 3 refills.  I have signed and dated this form.  Both forms to be faxed by CMA to fax number [phone].

## 2022-04-02 ENCOUNTER — Telehealth: Payer: Self-pay | Admitting: Nurse Practitioner

## 2022-04-02 DIAGNOSIS — I480 Paroxysmal atrial fibrillation: Secondary | ICD-10-CM | POA: Diagnosis not present

## 2022-04-02 DIAGNOSIS — J45909 Unspecified asthma, uncomplicated: Secondary | ICD-10-CM

## 2022-04-02 NOTE — Telephone Encounter (Signed)
Patient states that she does not have a nebulizer machine but solution was sent in.   Please send order to [facility] and call patient with an update.

## 2022-04-03 DIAGNOSIS — J45909 Unspecified asthma, uncomplicated: Secondary | ICD-10-CM | POA: Diagnosis not present

## 2022-04-03 NOTE — Telephone Encounter (Signed)
Called and spoke with patient, she states she had a nebulizer machine years ago, but does not currently have one.  She needs an order sent to [facility] for a nebulizer machine.  She also wants to get a POC.  She states she has not walked using a POC in the office.  She has an OV scheduled with Dr. Shearon Stalls, I advised her she can walk with the POC to see if she qualifies for one during her OV.  She verbalized understanding.  Order for nebulizer machine sent to [place].  Nothing further needed.

## 2022-04-07 ENCOUNTER — Other Ambulatory Visit: Payer: Self-pay | Admitting: Cardiology

## 2022-04-07 ENCOUNTER — Other Ambulatory Visit: Payer: Self-pay

## 2022-04-07 DIAGNOSIS — I480 Paroxysmal atrial fibrillation: Secondary | ICD-10-CM | POA: Diagnosis not present

## 2022-04-07 MED ORDER — APIXABAN 5 MG PO TABS
5.0000 mg | ORAL_TABLET | Freq: Two times a day (BID) | ORAL | 0 refills | Status: DC
Start: 2022-04-07 — End: 2022-09-04
  Filled 2022-08-04: qty 30, 15d supply, fill #0
  Filled 2022-08-14: qty 30, 15d supply, fill #1

## 2022-04-07 MED ORDER — DILTIAZEM HCL ER COATED BEADS 120 MG PO CP24: 120.0000 mg | ORAL_CAPSULE | Freq: Every day | ORAL | 0 refills | Status: DC

## 2022-04-07 MED ORDER — DILTIAZEM HCL ER COATED BEADS 120 MG PO CP24
120.0000 mg | ORAL_CAPSULE | Freq: Every day | ORAL | 0 refills | Status: DC
Start: 2022-04-07 — End: 2022-04-07

## 2022-04-07 NOTE — Progress Notes (Signed)
Called and spoke to patient she voiced understanding

## 2022-04-07 NOTE — Telephone Encounter (Signed)
Patient called requesting a refill of the Diltiazem '120mg'$  that was prescribed at the hospital in 08/23, she she also wanted to know if she could pick up samples of the Eliquis 5 mg. She has an appointment scheduled to see you on 05/07/2022.   Please advise

## 2022-04-07 NOTE — Progress Notes (Signed)
Frequent episodes of Afib. Please refill diltiazem and eliquis for 1 month. I will see her on 11/2 as scheduled, or sooner, if needed.  Thanks MJP

## 2022-04-10 ENCOUNTER — Encounter: Payer: Self-pay | Admitting: Internal Medicine

## 2022-04-10 ENCOUNTER — Telehealth: Payer: Self-pay | Admitting: Pharmacy Technician

## 2022-04-10 ENCOUNTER — Ambulatory Visit: Payer: Medicare Other | Attending: Internal Medicine | Admitting: Internal Medicine

## 2022-04-10 ENCOUNTER — Other Ambulatory Visit: Payer: Self-pay

## 2022-04-10 VITALS — BP 126/71 | HR 69 | Ht 64.0 in | Wt 236.2 lb

## 2022-04-10 DIAGNOSIS — Z23 Encounter for immunization: Secondary | ICD-10-CM

## 2022-04-10 DIAGNOSIS — K219 Gastro-esophageal reflux disease without esophagitis: Secondary | ICD-10-CM

## 2022-04-10 DIAGNOSIS — Z9981 Dependence on supplemental oxygen: Secondary | ICD-10-CM | POA: Diagnosis not present

## 2022-04-10 DIAGNOSIS — Z09 Encounter for follow-up examination after completed treatment for conditions other than malignant neoplasm: Secondary | ICD-10-CM

## 2022-04-10 DIAGNOSIS — E1159 Type 2 diabetes mellitus with other circulatory complications: Secondary | ICD-10-CM | POA: Diagnosis not present

## 2022-04-10 DIAGNOSIS — E1169 Type 2 diabetes mellitus with other specified complication: Secondary | ICD-10-CM | POA: Diagnosis not present

## 2022-04-10 DIAGNOSIS — J9611 Chronic respiratory failure with hypoxia: Secondary | ICD-10-CM

## 2022-04-10 DIAGNOSIS — I5032 Chronic diastolic (congestive) heart failure: Secondary | ICD-10-CM

## 2022-04-10 DIAGNOSIS — M545 Low back pain, unspecified: Secondary | ICD-10-CM | POA: Diagnosis not present

## 2022-04-10 DIAGNOSIS — I152 Hypertension secondary to endocrine disorders: Secondary | ICD-10-CM | POA: Diagnosis not present

## 2022-04-10 DIAGNOSIS — I480 Paroxysmal atrial fibrillation: Secondary | ICD-10-CM

## 2022-04-10 DIAGNOSIS — Z596 Low income: Secondary | ICD-10-CM

## 2022-04-10 MED ORDER — ZOSTER VAC RECOMB ADJUVANTED 50 MCG/0.5ML IM SUSR: 0.5000 mL | Freq: Once | INTRAMUSCULAR | 0 refills | Status: AC

## 2022-04-10 MED ORDER — TIZANIDINE HCL 2 MG PO TABS: 2.0000 mg | ORAL_TABLET | Freq: Every day | ORAL | 1 refills | Status: DC | PRN

## 2022-04-10 MED ORDER — PANTOPRAZOLE SODIUM 20 MG PO TBEC: 20.0000 mg | DELAYED_RELEASE_TABLET | Freq: Every day | ORAL | 6 refills | Status: DC

## 2022-04-10 NOTE — Progress Notes (Signed)
[facility])                                            [place] Team    04/10/2022  Heidi [name] [birth year] [id number]   Received both patient and provider portion(s) of patient assistance application(s) for Eliquis and Jardiance. Faxed completed application and required documents into BMS and BI respectively.   Heidi [name] P. Heidi [name], [place]  [phone]

## 2022-04-10 NOTE — Progress Notes (Signed)
Patient ID: Heidi [name], female    DOB: Jan 02, 1951  MRN: [id number]  CC: Hospitalization Follow-up   Subjective: Heidi [name] is a 71 y.o. female who presents for hospital follow-up.  Her daughter is with her. Her concerns today include:  history of HTN, DM 2 with retinopathy, HL, CKD 3, diastolic CHF, hypoxic respiratory failure/cough variant asthma vs moderate persistent asthma 2 L O2 continuously, migraines ([facility] on Botox inj), OSA (declines CPAP or repeat sleep study), UACS, GERD, OA knees,  DDD/spondylosis of cervical spine,obesity, renal cyst, LT thyroid nodule.  Patient hospitalized 8/28 - 03/07/2022 with chest pain and shortness of breath.  She had CTA done 02/11/2022 on previous ER visit that was negative for PE.  She ruled out for MI with negative troponins.  Cardiac catheterization showed no obstructive CAD. -Developed rapid atrial fibrillation.  Started on Eliquis and low-dose Cardizem.  Continued on metoprolol.  Converted to sinus rhythm without needing cardioversion. -Found to have acute on chronic diastolic CHF.  Diuresed.  Echo revealed preserved EF with moderate LVH.  Started on Jardiance and low-dose Cozaar. -Blood sugars poorly controlled with wide fluctuations.  Today: Atrial fibrillation: Taking Eliquis.  No bruising or bleeding.  Taking diltiazem and atenolol.  Diastolic CHF/asthma: On 2 L O2 continuous.  Pulse ox at home runs 93 to 95% on current O2 setting. Recently saw pulmonary NP.  Advair changed to Symbicort.  Using albuterol nebulizer once a day.  Reports a little cough last night.  Patient advised to have repeat sleep study done as she most likely still has sleep apnea.  Sleep study scheduled for 04/15/2022.  HTN: Reports compliance with Cozaar 25 mg, atenolol 25 mg and Cardizem 120 mg daily.  She limits salt in her foods.  DM: Lab Results  Component Value Date   HGBA1C 9.8 (H) 03/03/2022  Reports compliance with taking Jardiance 10 mg  daily, Humalog 75/25 80 units AM/55 units p.m. (written as 50 units a.m. and 40 units p.m. on hospital discharge) -Checking blood sugars 3-4 times a day before meals.  She has log with her.  Blood sugars before breakfast range 97-134, before lunch in the 80s to 90s, before dinner 106-160. -Due for diabetic eye exam.  Complains of chronic pain in her neck and lower back and wanting me to prescribe Lortab.  She had seen Dr. Danae Chen last year for neck pain.  His assessment was that her radiologic findings were not significant to warrant long-term opioids.  He had recommend referral to physical therapy but patient had declined.  She tells me that sometimes she has pain across the lower back on both sides.  No radiation.  Patient is requesting to be put back on Protonix.  Reports heartburn that does not respond to famotidine.  HM: Agreeable to receiving flu shot today.  Never received COVID-vaccine but states she plans to get it.  Due for shingles vaccine.  She is agreeable to this.    Patient Active Problem List   Diagnosis Date Noted   Chronic diastolic CHF (congestive heart failure) ([place]) 03/18/2022   Paroxysmal atrial fibrillation (HCC)    Unstable angina (HCC)    Chronic respiratory failure with hypoxia ([place]) 03/03/2022   Moderate persistent asthma 03/03/2022   Migraines 03/03/2022   Moderate persistent asthma without complication 09/32/3557   History of colonic polyps    Benign neoplasm of ascending colon    Benign neoplasm of transverse colon    Benign neoplasm of descending colon  Postural dizziness with presyncope 02/10/2021   Acute encephalopathy 01/27/2021   Fall at home, initial encounter 01/27/2021   Pneumonia due to COVID-19 virus 08/10/2020   Encephalopathy due to COVID-19 virus 08/10/2020   Acute kidney injury superimposed on CKD ([place]) 08/10/2020   COVID 08/10/2020   Encounter for medication review and counseling 03/18/2020   Medication management 02/28/2020    Exertional dyspnea 01/03/2020   Breast nodule 11/24/2019   Chronic respiratory failure with hypoxia, on home oxygen therapy ([place]) 11/13/2019   Chest pain 08/24/2019   Diabetic retinopathy of right eye associated with type 2 diabetes mellitus ([place]) 08/16/2018   Upper airway cough syndrome 05/03/2018   Morbid obesity due to excess calories ([place]) complicated by hbp/ dm / hyperlipidemia 09/10/2017   Cough variant asthma vs UACS/vcd on ACEi  09/09/2017   Anxiety and depression 09/06/2017   Mild persistent asthma without complication 27/51/7001   Paranoia ([place]) 09/06/2017   Torn left ear lobe 11/20/2016   Heart failure with preserved ejection fraction ([place]) 06/27/2016   Post-menopausal atrophic vaginitis 11/14/2015   Primary osteoarthritis of both knees 07/18/2015   GERD (gastroesophageal reflux disease) 05/08/2015   Renal cyst 05/08/2015   Adenomatous polyp of colon 03/26/2015   Essential hypertension 02/12/2015   Cognitive deficit due to old head trauma 02/12/2015   Chronic left shoulder pain 02/11/2015   Thyroid nodule 12/18/2014   Chronic neck pain    HLD (hyperlipidemia)    OSA treated with BiPAP 11/27/2014   Obesity hypoventilation syndrome ([place]) 11/27/2014   Demand ischemia    Uncontrolled type 2 diabetes mellitus with hyperglycemia ([place])    Cardiomegaly 11/25/2014   DM type 2 causing CKD stage 2 ([place]) 11/25/2014     Current Outpatient Medications on File Prior to Visit  Medication Sig Dispense Refill   acetaminophen (TYLENOL) 500 MG tablet Take 500-1,000 mg by mouth every 6 (six) hours as needed (pain).     albuterol (PROVENTIL) (2.5 MG/3ML) 0.083% nebulizer solution Take 3 mLs (2.5 mg total) by nebulization every 6 (six) hours as needed for wheezing or shortness of breath. 75 mL 5   albuterol (VENTOLIN HFA) 108 (90 Base) MCG/ACT inhaler Inhale 1-2 puffs into the lungs every 4 (four) hours as needed for wheezing or shortness of breath. 8 g 5   apixaban (ELIQUIS) 5 MG TABS  tablet Take 1 tablet (5 mg total) by mouth 2 (two) times daily. 60 tablet 0   atenolol (TENORMIN) 25 MG tablet TAKE 1 TABLET(25 MG) BY MOUTH DAILY (Patient taking differently: Take 25 mg by mouth daily.) 90 tablet 0   atorvastatin (LIPITOR) 40 MG tablet TAKE 1 TABLET BY MOUTH DAILY AT 6 PM (Patient taking differently: Take 40 mg by mouth every evening.) 90 tablet 2   Botulinum Toxin Type A (BOTOX) 200 units SOLR Provider to inject 155 units into the muscles of the head and neck every 12 weeks. Discard remainder. 1 each 3   budesonide-formoterol (SYMBICORT) 80-4.5 MCG/ACT inhaler Inhale 2 puffs into the lungs in the morning and at bedtime. 1 each 5   diltiazem (CARDIZEM CD) 120 MG 24 hr capsule TAKE 1 CAPSULE(120 MG) BY MOUTH DAILY 90 capsule 0   diltiazem (CARDIZEM CD) 120 MG 24 hr capsule TAKE 1 CAPSULE(120 MG) BY MOUTH DAILY 90 capsule 0   empagliflozin (JARDIANCE) 10 MG TABS tablet Take 1 tablet (10 mg total) by mouth daily. 30 tablet 0   famotidine (PEPCID) 20 MG tablet Take 1 tablet (20 mg total) by mouth  daily. 60 tablet 5   furosemide (LASIX) 40 MG tablet TAKE 1 TABLET(40 MG) BY MOUTH DAILY (Patient taking differently: Take 40 mg by mouth daily.) 90 tablet 1   insulin lispro protamine-lispro (HUMALOG MIX 75/25) (75-25) 100 UNIT/ML SUSP injection Inject 50 units in the morning and 40 units in the evening.     loratadine (CLARITIN) 10 MG tablet Take 1 tablet (10 mg total) by mouth daily as needed for allergies. 90 tablet 0   losartan (COZAAR) 50 MG tablet Take 0.5 tablets (25 mg total) by mouth daily.     montelukast (SINGULAIR) 10 MG tablet Take 1 tablet (10 mg total) by mouth at bedtime. 90 tablet 2   Multiple Vitamin (MULTIVITAMIN WITH MINERALS) TABS tablet Take 1 tablet by mouth in the morning. Centrum Silver     nitroGLYCERIN (NITROSTAT) 0.3 MG SL tablet DISSOLVE 1 TABLET UNDER THE  TONGUE EVERY 5 MINUTES AS NEEDED FOR CHEST PAIN. MAX OF 3 TABLETS IN 15 MINUTES. CALL 911 IF PAIN   PERSISTS. Strength: 0.3 mg 200 tablet 2   OXYGEN Inhale 2 L into the lungs continuous.     Potassium Chloride ER 20 MEQ TBCR TAKE 1 TABLET BY MOUTH DAILY (Patient taking differently: Take 20 mEq by mouth daily.) 90 tablet 3   tetrahydrozoline-zinc (VISINE-AC) 0.05-0.25 % ophthalmic solution Place 1 drop into both eyes 3 (three) times daily as needed (dry / irritated eyes).     topiramate (TOPAMAX) 25 MG capsule Take 3 capsules (75 mg total) by mouth 2 (two) times daily. 180 capsule 11   Rimegepant Sulfate (NURTEC) 75 MG TBDP Take 75 mg by mouth daily as needed. For migraines. Take as close to onset of migraine as possible. One daily maximum. (Patient not taking: Reported on 04/10/2022) 16 tablet 11   No current facility-administered medications on file prior to visit.    Allergies  Allergen Reactions   Januvia [Sitagliptin]     Chest pains   Tramadol Other (See Comments)    hallucinations    Social History   Socioeconomic History   Marital status: Widowed    Spouse name: Not on file   Number of children: 4   Years of education: Not on file   Highest education level: Not on file  Occupational History   Occupation: Disabled  Tobacco Use   Smoking status: Never    Passive exposure: Never   Smokeless tobacco: Never  Vaping Use   Vaping Use: Never used  Substance and Sexual Activity   Alcohol use: No   Drug use: No   Sexual activity: Yes  Other Topics Concern   Not on file  Social History Narrative   Lives with daughter   Caffeine use: 1 cup coffee per day   Social Determinants of Health   Financial Resource Strain: Not on file  Food Insecurity: Not on file  Transportation Needs: Not on file  Physical Activity: Not on file  Stress: Not on file  Social Connections: Not on file  Intimate Partner Violence: Not on file    Family History  Problem Relation Age of Onset   Colon cancer Father    Diabetes type II Sister    Diabetes type II Brother    Colon cancer Sister     Migraines Neg Hx    Breast cancer Neg Hx     Past Surgical History:  Procedure Laterality Date   ABDOMINAL HYSTERECTOMY     CATARACT EXTRACTION Left    CATARACT EXTRACTION  LT 01/2017, 02/2017 on RT   CESAREAN SECTION     COLONOSCOPY WITH PROPOFOL N/A 04/15/2021   Procedure: COLONOSCOPY WITH PROPOFOL;  Surgeon: Irene Shipper, MD;  Location: [facility];  Service: Endoscopy;  Laterality: N/A;  patient is on O2   JOINT REPLACEMENT Left    Plate in Ankle   LEFT HEART CATH AND CORONARY ANGIOGRAPHY N/A 03/04/2022   Procedure: LEFT HEART CATH AND CORONARY ANGIOGRAPHY;  Surgeon: Nigel Mormon, MD;  Location: [place];  Service: Cardiovascular;  Laterality: N/A;   POLYPECTOMY  04/15/2021   Procedure: POLYPECTOMY;  Surgeon: Irene Shipper, MD;  Location: [facility];  Service: Endoscopy;;    ROS: Review of Systems Negative except as stated above  PHYSICAL EXAM: BP 126/71   Pulse 69   Ht '5\' 4"'$  (1.626 m)   Wt 236 lb 3.2 oz (107.1 kg)   SpO2 95%   BMI 40.54 kg/m   Wt Readings from Last 3 Encounters:  04/10/22 236 lb 3.2 oz (107.1 kg)  03/18/22 239 lb 9.6 oz (108.7 kg)  03/07/22 242 lb 8 oz (110 kg)    Physical Exam  General appearance - alert, well appearing, obese older African-American female and in no distress.  She has her O2 on. Mental status - normal mood, behavior, speech, dress, motor activity, and thought processes Neck - supple, no significant adenopathy Chest -breath sounds moderately decreased bilaterally.  No wheezes or crackles heard. Heart -regular rate and rhythm. Musculoskeletal -no tenderness on palpation of the paraspinal muscles in the lumbar region. Extremities -no lower extremity edema.      Latest Ref Rng & Units 03/07/2022    4:06 AM 03/06/2022    4:53 AM 03/05/2022    3:06 AM  CMP  Glucose 70 - 99 mg/dL 147  79  220   BUN 8 - 23 mg/dL '28  24  26   '$ Creatinine 0.44 - 1.00 mg/dL 1.26  1.20  1.23   Sodium 135 - 145 mmol/L 142  143   140   Potassium 3.5 - 5.1 mmol/L 4.0  4.1  4.1   Chloride 98 - 111 mmol/L 100  105  102   CO2 22 - 32 mmol/L 31  29  32   Calcium 8.9 - 10.3 mg/dL 9.0  9.1  9.0    Lipid Panel     Component Value Date/Time   CHOL 120 08/21/2021 1522   TRIG 91 08/21/2021 1522   HDL 53 08/21/2021 1522   CHOLHDL 2.3 08/21/2021 1522   CHOLHDL 2.6 01/28/2021 0523   VLDL 30 01/28/2021 0523   LDLCALC 50 08/21/2021 1522    CBC    Component Value Date/Time   WBC 8.9 03/07/2022 0239   RBC 4.76 03/07/2022 0239   HGB 13.4 03/07/2022 0239   HGB 13.3 08/30/2020 1141   HCT 43.0 03/07/2022 0239   HCT 41.9 08/30/2020 1141   PLT 229 03/07/2022 0239   PLT 128 (L) 08/30/2020 1141   MCV 90.3 03/07/2022 0239   MCV 88 08/30/2020 1141   MCH 28.2 03/07/2022 0239   MCHC 31.2 03/07/2022 0239   RDW 15.3 03/07/2022 0239   RDW 15.2 08/30/2020 1141   LYMPHSABS 1.7 03/03/2022 0551   LYMPHSABS 4.3 (H) 08/23/2017 1655   MONOABS 0.3 03/03/2022 0551   EOSABS 0.2 03/03/2022 0551   EOSABS 0.4 08/23/2017 1655   BASOSABS 0.0 03/03/2022 0551   BASOSABS 0.1 08/23/2017 1655    ASSESSMENT AND PLAN:  1. Hospital  discharge follow-up   2. Type 2 diabetes mellitus with morbid obesity (HCC) Her blood sugar log shows that her blood sugars are actually doing pretty good.  She will continue current dose of Humalog 75/25 and Jardiance.  Encourage healthy eating habits. - Ambulatory referral to Ophthalmology  3. Hypertension associated with type 2 diabetes mellitus ([place]) At goal.  Continue medications listed above.  4. Chronic diastolic congestive heart failure ([place]) Compensated.  Continue furosemide, Jardiance and low-salt diet.  5. Chronic respiratory failure with hypoxia, on home oxygen therapy ([place]) On home O2 continuous.  6. PAF (paroxysmal atrial fibrillation) (HCC) Sounds to be in sinus rhythm today.  She will continue Cardizem and atenolol.  She has upcoming appointment with cardiologist next month.  7. Bilateral  low back pain, unspecified chronicity, unspecified whether sciatica present Advised patient that we do not prescribe any heavy narcotics through this practice.  We had spoken about this before.  Last CT of the cervical spine did not reveal any significant disease process.  Advised to use Tylenol as needed.  Patient is requesting a muscle relaxant to use as needed stating that it was helpful to her in the past.  Advised that the medication can cause some drowsiness.  Gave her some tizanidine to use as needed. - tiZANidine (ZANAFLEX) 2 MG tablet; Take 1 tablet (2 mg total) by mouth daily as needed for muscle spasms.  Dispense: 30 tablet; Refill: 1  8. Gastroesophageal reflux disease without esophagitis Stop Pepcid.  We will put her back on Protonix. - pantoprazole (PROTONIX) 20 MG tablet; Take 1 tablet (20 mg total) by mouth daily.  Dispense: 30 tablet; Refill: 6  9. Need for shingles vaccine Given prescription for the Shingrix to take to her pharmacy. - Zoster Vaccine Adjuvanted [facility]) injection; Inject 0.5 mLs into the muscle once for 1 dose.  Dispense: 0.5 mL; Refill: 0  10. Need for immunization against influenza - Flu Vaccine QUAD High Dose(Fluad)   Patient was given the opportunity to ask questions.  Patient verbalized understanding of the plan and was able to repeat key elements of the plan.   This documentation was completed using Radio producer.  Any transcriptional errors are unintentional.  No orders of the defined types were placed in this encounter.    Requested Prescriptions    No prescriptions requested or ordered in this encounter    No follow-ups on file.  Karle Plumber, MD, FACP

## 2022-04-13 ENCOUNTER — Ambulatory Visit: Payer: Medicare Other | Admitting: Cardiology

## 2022-04-13 ENCOUNTER — Other Ambulatory Visit: Payer: Self-pay | Admitting: Cardiology

## 2022-04-14 ENCOUNTER — Other Ambulatory Visit: Payer: Self-pay

## 2022-04-15 ENCOUNTER — Encounter (HOSPITAL_BASED_OUTPATIENT_CLINIC_OR_DEPARTMENT_OTHER): Payer: Medicare Other | Admitting: Pulmonary Disease

## 2022-04-15 ENCOUNTER — Telehealth: Payer: Self-pay | Admitting: Neurology

## 2022-04-15 ENCOUNTER — Telehealth: Payer: Self-pay | Admitting: Pharmacy Technician

## 2022-04-15 DIAGNOSIS — Z596 Low income: Secondary | ICD-10-CM

## 2022-04-15 NOTE — Telephone Encounter (Signed)
The Botox PA authorization sheet has been faxed to [facility] at [phone] as requested. Auth dates are 06-25-2021 through 06-25-2022. Auth # is [id number].

## 2022-04-15 NOTE — Telephone Encounter (Signed)
[email or web address] [facility] is asking that the PA for pt's Botox be either faxed to them # [phone] or submitted to [email or web address]

## 2022-04-15 NOTE — Progress Notes (Signed)
[facility])                                            [place] Team    04/15/2022  Heidi [name] August 07, 1950 [id number]  Two care coordination calls placed to [place] in regard to Jardiance application and [employer] in regard to Eliquis application.  Spoke to [place] at [facility]  in regard to [place] who informs patient is APPROVED 04/09/22-07/05/22. Medication will be delivered to the patient's home.  Spoke to [place] at [facility] in regard to Eliquis who informs patient is APPROVED 04/13/22-07/05/22. Medication will be delivered to the patient's home.  [name] P. [name], [name]  [phone]

## 2022-04-17 NOTE — Telephone Encounter (Signed)
Pt is calling. Said she wants to follow-up on her Botox appointment. Pt is requesting a call back from the nurse.

## 2022-04-20 NOTE — Telephone Encounter (Signed)
Perfect, thank you

## 2022-04-20 NOTE — Telephone Encounter (Signed)
[place] Renae Gloss) scheduling delivery Botox on 04/21/22 for 200 units quantity of one.

## 2022-04-20 NOTE — Telephone Encounter (Signed)
Completed urgent PA on Cover My Meds. Updated patient via telephone. She will call [facility] today to make sure all of her information is up-to-date to help expedite process for getting the Botox shipped this week.

## 2022-04-20 NOTE — Telephone Encounter (Signed)
Spoke with the patient.  She states [facility] told her they would call her back when he had everything they needed.  I told her I would reach out to them.  I called [facility] at [phone] and I spoke with Deneisha.  She stated the PA they had on file for the Botox expired September 2023 under PA - [id number].  The PA we had on file with [place] [facility] most likely the procedure approval.  She will need a new PA for Botox.  The representative started a cover my meds KEY: [id number]. Urgent review can be requested for turnaround time of 24 hours, then Botox can be shipped to office. Reference # for call is [id number].

## 2022-04-20 NOTE — Telephone Encounter (Addendum)
LVM for patient to call back about botox appointment

## 2022-04-20 NOTE — Telephone Encounter (Signed)
Reference #: [id number] RE: Prior Authorization Request Patient Name: Heidi [name] Patient DOB: Nov 06, 1950 Patient ID: [id number] Status of Request: Approve Medication Name: Botox Inj 200unit GPI/NDC: [id number] Decision Notes: BOTOX INJ 200UNIT, use as directed, is approved through 07/21/2022 under your Medicare Part D benefit.

## 2022-04-20 NOTE — Telephone Encounter (Signed)
Faxed approval notice to [facility] at  [phone] and asked for urgent processing so we can get shipment scheduled.

## 2022-04-20 NOTE — Telephone Encounter (Signed)
Pt has called Heidi [name] back re: her Botox appointment, please call.

## 2022-04-23 ENCOUNTER — Encounter: Payer: Self-pay | Admitting: Internal Medicine

## 2022-04-23 ENCOUNTER — Ambulatory Visit: Payer: Medicare Other | Admitting: Neurology

## 2022-04-23 ENCOUNTER — Ambulatory Visit: Payer: Medicare Other | Admitting: Internal Medicine

## 2022-04-23 VITALS — BP 120/60 | HR 75 | Ht 64.0 in | Wt 239.4 lb

## 2022-04-23 DIAGNOSIS — G4733 Obstructive sleep apnea (adult) (pediatric): Secondary | ICD-10-CM | POA: Diagnosis not present

## 2022-04-23 DIAGNOSIS — I5032 Chronic diastolic (congestive) heart failure: Secondary | ICD-10-CM

## 2022-04-23 DIAGNOSIS — J9611 Chronic respiratory failure with hypoxia: Secondary | ICD-10-CM

## 2022-04-23 DIAGNOSIS — J4540 Moderate persistent asthma, uncomplicated: Secondary | ICD-10-CM | POA: Diagnosis not present

## 2022-04-23 DIAGNOSIS — G43709 Chronic migraine without aura, not intractable, without status migrainosus: Secondary | ICD-10-CM

## 2022-04-23 MED ORDER — ONABOTULINUMTOXINA 200 UNITS IJ SOLR
155.0000 [IU] | Freq: Once | INTRAMUSCULAR | Status: AC
  Administered 2022-04-23: 155 [IU] via INTRAMUSCULAR

## 2022-04-23 NOTE — Progress Notes (Signed)
04/23/2022: stable doing great >>50% improvement I migraine freq and severity and duration 01/28/2022: With the botox she only gets 4 migraines a month and < 6 total headache days a month. Needs acute management. Try Roselyn Meier. Failed sumatriptan and rizatriptan. Need to find out what dose of topamax she is on and refill  Meds ordered this encounter  Medications   botulinum toxin Type A (BOTOX) injection 155 Units    Botox- 200 units x 1 vial Lot: [id number] Expiration: 08/2024 NDC: 0932-6712-45  Bacteriostatic 0.9% Sodium Chloride- 85m total Lot: [id number]: 03/07/2023 NDC: [address]-76 Dx: [id number]        09/30/2021: stable Interval history 07/08/2020 STABLE: Patient with chronic intractable migraines. Still doing exceptionally well on Botox for Migraines, only botox and topiramate have ever helped. Prior tried multiple classes of medications which did not help with headaches or migraines until she started Botox. Since starting botox she has had  >95% improvement in frequency and severity, had to redo one botox and she did well again. The migraines she does have are sparse, and only recur when the botox is wearing off the last 2 weeks but still are easier to treat and >60% less severe.Nothing has worked in the past except botox and Topiramate.  No masseters or any other additional injections  Consent Form Botulism Toxin Injection For Chronic Migraine    Reviewed orally with patient, additionally signature is on file:  Botulism toxin has been approved by the Federal drug administration for treatment of chronic migraine. Botulism toxin does not cure chronic migraine and it may not be effective in some patients.  The administration of botulism toxin is accomplished by injecting a small amount of toxin into the muscles of the neck and head. Dosage must be titrated for each individual. Any benefits resulting from botulism toxin tend to wear off after 3 months with a repeat  injection required if benefit is to be maintained. Injections are usually done every 3-4 months with maximum effect peak achieved by about 2 or 3 weeks. Botulism toxin is expensive and you should be sure of what costs you will incur resulting from the injection.  The side effects of botulism toxin use for chronic migraine may include:   -Transient, and usually mild, facial weakness with facial injections  -Transient, and usually mild, head or neck weakness with head/neck injections  -Reduction or loss of forehead facial animation due to forehead muscle weakness  -Eyelid drooping  -Dry eye  -Pain at the site of injection or bruising at the site of injection  -Double vision  -Potential unknown long term risks  Contraindications: You should not have Botox if you are pregnant, nursing, allergic to albumin, have an infection, skin condition, or muscle weakness at the site of the injection, or have myasthenia gravis, Lambert-Eaton syndrome, or ALS.  It is also possible that as with any injection, there may be an allergic reaction or no effect from the medication. Reduced effectiveness after repeated injections is sometimes seen and rarely infection at the injection site may occur. All care will be taken to prevent these side effects. If therapy is given over a long time, atrophy and wasting in the muscle injected may occur. Occasionally the patient's become refractory to treatment because they develop antibodies to the toxin. In this event, therapy needs to be modified.  I have read the above information and consent to the administration of botulism toxin.    BOTOX PROCEDURE NOTE FOR MIGRAINE HEADACHE    Contraindications  and precautions discussed with patient(above). Aseptic procedure was observed and patient tolerated procedure. Procedure performed by Dr. Georgia Dom  The condition has existed for more than 6 months, and pt does not have a diagnosis of ALS, Myasthenia Gravis or Lambert-Eaton  Syndrome.  Risks and benefits of injections discussed and pt agrees to proceed with the procedure.  Written consent obtained  These injections are medically necessary. Pt  receives good benefits from these injections. These injections do not cause sedations or hallucinations which the oral therapies may cause.  Description of procedure:  The patient was placed in a sitting position. The standard protocol was used for Botox as follows, with 5 units of Botox injected at each site:   -Procerus muscle, midline injection  -Corrugator muscle, bilateral injection  -Frontalis muscle, bilateral injection, with 2 sites each side, medial injection was performed in the upper one third of the frontalis muscle, in the region vertical from the medial inferior edge of the superior orbital rim. The lateral injection was again in the upper one third of the forehead vertically above the lateral limbus of the cornea, 1.5 cm lateral to the medial injection site.  -Temporalis muscle injection, 4 sites, bilaterally. The first injection was 3 cm above the tragus of the ear, second injection site was 1.5 cm to 3 cm up from the first injection site in line with the tragus of the ear. The third injection site was 1.5-3 cm forward between the first 2 injection sites. The fourth injection site was 1.5 cm posterior to the second injection site.   -Occipitalis muscle injection, 3 sites, bilaterally. The first injection was done one half way between the occipital protuberance and the tip of the mastoid process behind the ear. The second injection site was done lateral and superior to the first, 1 fingerbreadth from the first injection. The third injection site was 1 fingerbreadth superiorly and medially from the first injection site.  -Cervical paraspinal muscle injection, 2 sites, bilateral knee first injection site was 1 cm from the midline of the cervical spine, 3 cm inferior to the lower border of the occipital protuberance.  The second injection site was 1.5 cm superiorly and laterally to the first injection site.  -Trapezius muscle injection was performed at 3 sites, bilaterally. The first injection site was in the upper trapezius muscle halfway between the inflection point of the neck, and the acromion. The second injection site was one half way between the acromion and the first injection site. The third injection was done between the first injection site and the inflection point of the neck.   Will return for repeat injection in 3 months.   155 units of Botox was used, 45U Botox not injected was wasted. The patient tolerated the procedure well, there were no complications of the above procedure.

## 2022-04-23 NOTE — Progress Notes (Signed)
Botox- 200 units x 1 vial Lot: [id number] Expiration: 08/2024 NDC: 6834-1962-22  Bacteriostatic 0.9% Sodium Chloride- 13m total Lot: [id number]: 03/07/2023 NDC: [address]-81 Dx: [id number]/P

## 2022-04-23 NOTE — Patient Instructions (Addendum)
Please schedule follow up scheduled with APP in 3 months.  If my schedule is not open yet, we will contact you with a reminder closer to that time. Please call [phone] if you haven't heard from [place] a month before.   Before your next visit I would like you to have: Full set of PFTs 1 hour  Continue symbicort 2 puffs twice a day continue albuterol as needed

## 2022-04-23 NOTE — Progress Notes (Signed)
Heidi [name]    [id number]    June 10, 1951  Primary Care Physician:Johnson, Dalbert Batman, MD Date of Appointment: 04/23/2022 Established Patient Visit  Chief complaint:   No chief complaint on file.    HPI: Heidi [name] is a 71 y.o. woman with chronic hypercapnia and hypoxemia on BIPAP, asthma on advair, and covid 19 infection Feb 2022 with severe parenchymal lung injury on [facility] continuously. Start on oxygen August 2021.   Interval Updates: Last seen by me 18 months ago.  In the interim has seen Dr. Loanne Drilling, [facility] NP. Switched from advair to [employer]. Feels much better ont his.  Also has Chronic HFpEF and is managed with diureitics and is on eliquis for A. Fib.  Was supposed to have sleep study - it is ordered in sept 2023. Not yet scheduled.   DME - [facility]   Current Regimen: symbicort 2 puffs twice a day  Asthma Triggers: exercise, cold weather Exacerbations in the last year: 3, related to covid and URI.  History of hospitalization or intubation: yes, not intubation Allergy Testing: none GERD: denies Allergic Rhinitis: denies ACT:  Asthma Control Test ACT Total Score  10/09/2021 11:27 AM 15  10/21/2020 11:16 AM 17  09/13/2020 12:12 PM 14   FeNO: 8 ppb   I have reviewed the patient's family social and past medical history and updated as appropriate.   Past Medical History:  Diagnosis Date   Adenomatous colon polyp    Anemia    Anxiety    Asthma    CHF (congestive heart failure) (HCC)    COPD (chronic obstructive pulmonary disease) ([place])    Diabetes mellitus without complication ([place])    High cholesterol    Hypertension    Left knee injury    Pneumonia    Sleep apnea    Supplemental oxygen dependent    2L [place]    Past Surgical History:  Procedure Laterality Date   ABDOMINAL HYSTERECTOMY     CATARACT EXTRACTION Left    CATARACT EXTRACTION     LT 01/2017, 02/2017 on RT   CESAREAN SECTION     COLONOSCOPY WITH PROPOFOL N/A 04/15/2021    Procedure: COLONOSCOPY WITH PROPOFOL;  Surgeon: Irene Shipper, MD;  Location: [facility];  Service: Endoscopy;  Laterality: N/A;  patient is on O2   JOINT REPLACEMENT Left    Plate in Ankle   LEFT HEART CATH AND CORONARY ANGIOGRAPHY N/A 03/04/2022   Procedure: LEFT HEART CATH AND CORONARY ANGIOGRAPHY;  Surgeon: Nigel Mormon, MD;  Location: [facility];  Service: Cardiovascular;  Laterality: N/A;   POLYPECTOMY  04/15/2021   Procedure: POLYPECTOMY;  Surgeon: Irene Shipper, MD;  Location: [facility];  Service: Endoscopy;;    Family History  Problem Relation Age of Onset   Colon cancer Father    Diabetes type II Sister    Diabetes type II Brother    Colon cancer Sister    Migraines Neg Hx    Breast cancer Neg Hx     Social History   Occupational History   Occupation: Disabled  Tobacco Use   Smoking status: Never    Passive exposure: Never   Smokeless tobacco: Never  Vaping Use   Vaping Use: Never used  Substance and Sexual Activity   Alcohol use: No   Drug use: No   Sexual activity: Yes     Physical Exam: Blood pressure 120/60, pulse 75, height '5\' 4"'$  (1.626 m),  weight 239 lb 6.4 oz (108.6 kg), SpO2 95 %.  Gen:      No acute distress, obese ENT:   On oxygen Lungs:  diminished, no wheezes or crackles CV:         RRR, pitting edema noted in bilateral lower extremities   Data Reviewed: Imaging: I have personally reviewed the CT Chest Feb 2022 and it shows patchy multifocal airspace opacities consistent with covid 19 infection.   PFTs:      Latest Ref Rng & Units 10/27/2017   11:49 AM  PFT Results  FVC-Pre L 1.24   FVC-Predicted Pre % 49   FVC-Post L 1.14   FVC-Predicted Post % 45   Pre FEV1/FVC % % 86   Post FEV1/FCV % % 87   FEV1-Pre L 1.07   FEV1-Predicted Pre % 54   FEV1-Post L 1.00   DLCO uncorrected ml/min/mmHg 9.77   DLCO UNC% % 40   DLVA Predicted % 116   TLC L 3.04   TLC % Predicted % 60   RV % Predicted % 62    I have  personally reviewed the patient's PFTs and they show mild restriction to ventilation with reduced diffusion capacity.   Labs: covid 19 positive jan 2022  Immunization status: Immunization History  Administered Date(s) Administered   Fluad Quad(high Dose 65+) 04/10/2022   Influenza Whole 05/11/2014   Influenza, High Dose Seasonal PF 04/08/2018, 02/25/2019   Influenza,inj,Quad PF,6+ Mos 03/26/2015, 05/12/2016, 04/18/2017   Pneumococcal Conjugate-13 01/14/2015   Pneumococcal Polysaccharide-23 12/19/2013, 11/13/2019   Tdap 11/20/2016    Assessment:  Moderate Persistent Asthma, non TH2 mediated Chronic Hypoxemic Respiratory Failure Suspected OHS/OSA Chronic HFpEF, compensated Paroxysmal atrial fibrillation on eliquis  Plan/Recommendations: Continue symbicort 2 puffs twice a day continue albuterol as needed Still has not had PFTs.  Sleep study ordered - still not scheduled. They called her to schedule and she says she is putting this off right now. (This is a recurring issue for her.)  Did an ambulatory desaturation study today for POC. Requires 2LNC at rest and with exertion. Will send order to [facility].    Return to Care: Return in about 3 months (around 07/24/2022).   [name], MD Pulmonary and [facility]

## 2022-04-24 DIAGNOSIS — I10 Essential (primary) hypertension: Secondary | ICD-10-CM | POA: Diagnosis not present

## 2022-04-24 DIAGNOSIS — J45909 Unspecified asthma, uncomplicated: Secondary | ICD-10-CM | POA: Diagnosis not present

## 2022-04-30 ENCOUNTER — Other Ambulatory Visit: Payer: Self-pay

## 2022-04-30 MED ORDER — NITROGLYCERIN 0.3 MG SL SUBL: SUBLINGUAL_TABLET | SUBLINGUAL | 2 refills | Status: DC

## 2022-05-03 DIAGNOSIS — J45909 Unspecified asthma, uncomplicated: Secondary | ICD-10-CM | POA: Diagnosis not present

## 2022-05-07 ENCOUNTER — Ambulatory Visit: Payer: Medicare Other | Admitting: Cardiology

## 2022-05-07 ENCOUNTER — Encounter: Payer: Self-pay | Admitting: Cardiology

## 2022-05-07 VITALS — BP 127/69 | HR 87 | Resp 16 | Ht 64.0 in | Wt 237.0 lb

## 2022-05-07 DIAGNOSIS — I480 Paroxysmal atrial fibrillation: Secondary | ICD-10-CM

## 2022-05-07 DIAGNOSIS — I10 Essential (primary) hypertension: Secondary | ICD-10-CM

## 2022-05-07 NOTE — Progress Notes (Signed)
Patient referred by Ladell Pier, MD for chest pain  Subjective:   Heidi [name], female    DOB: February 25, 1951, 71 y.o.   MRN: [id number]   Chief Complaint  Patient presents with   Atrial Fibrillation   Follow-up   Results    monitor    HPI  71 y.o. African American female with hypertension, type 2 DM with retinopathy, PAF, COPD, hyperlipidemia, CKD stage III, OSA, obesity, breast nodule, chronic migraines  Reviewed recent monitor results with the patient. She has not had any recent palpitations symptoms. HR has not been >80-90 bpm.   Current Outpatient Medications:    acetaminophen (TYLENOL) 500 MG tablet, Take 500-1,000 mg by mouth every 6 (six) hours as needed (pain)., Disp: , Rfl:    albuterol (PROVENTIL) (2.5 MG/3ML) 0.083% nebulizer solution, Take 3 mLs (2.5 mg total) by nebulization every 6 (six) hours as needed for wheezing or shortness of breath., Disp: 75 mL, Rfl: 5   albuterol (VENTOLIN HFA) 108 (90 Base) MCG/ACT inhaler, Inhale 1-2 puffs into the lungs every 4 (four) hours as needed for wheezing or shortness of breath., Disp: 8 g, Rfl: 5   apixaban (ELIQUIS) 5 MG TABS tablet, Take 1 tablet (5 mg total) by mouth 2 (two) times daily., Disp: 60 tablet, Rfl: 0   atenolol (TENORMIN) 25 MG tablet, TAKE 1 TABLET(25 MG) BY MOUTH DAILY (Patient taking differently: Take 25 mg by mouth daily.), Disp: 90 tablet, Rfl: 0   atorvastatin (LIPITOR) 40 MG tablet, TAKE 1 TABLET BY MOUTH DAILY AT 6 PM (Patient taking differently: Take 40 mg by mouth every evening.), Disp: 90 tablet, Rfl: 2   Botulinum Toxin Type A (BOTOX) 200 units SOLR, Provider to inject 155 units into the muscles of the head and neck every 12 weeks. Discard remainder., Disp: 1 each, Rfl: 3   budesonide-formoterol (SYMBICORT) 80-4.5 MCG/ACT inhaler, Inhale 2 puffs into the lungs in the morning and at bedtime., Disp: 1 each, Rfl: 5   diltiazem (CARDIZEM CD) 120 MG 24 hr capsule, TAKE 1 CAPSULE(120 MG) BY MOUTH  DAILY, Disp: 90 capsule, Rfl: 0   empagliflozin (JARDIANCE) 10 MG TABS tablet, Take 1 tablet (10 mg total) by mouth daily., Disp: 30 tablet, Rfl: 0   furosemide (LASIX) 40 MG tablet, TAKE 1 TABLET(40 MG) BY MOUTH DAILY (Patient taking differently: Take 40 mg by mouth daily.), Disp: 90 tablet, Rfl: 1   insulin lispro protamine-lispro (HUMALOG MIX 75/25) (75-25) 100 UNIT/ML SUSP injection, Inject 50 units in the morning and 40 units in the evening., Disp: , Rfl:    loratadine (CLARITIN) 10 MG tablet, Take 1 tablet (10 mg total) by mouth daily as needed for allergies., Disp: 90 tablet, Rfl: 0   losartan (COZAAR) 50 MG tablet, Take 0.5 tablets (25 mg total) by mouth daily., Disp: , Rfl:    montelukast (SINGULAIR) 10 MG tablet, Take 1 tablet (10 mg total) by mouth at bedtime., Disp: 90 tablet, Rfl: 2   Multiple Vitamin (MULTIVITAMIN WITH MINERALS) TABS tablet, Take 1 tablet by mouth in the morning. Centrum Silver, Disp: , Rfl:    nitroGLYCERIN (NITROSTAT) 0.3 MG SL tablet, DISSOLVE 1 TABLET UNDER THE  TONGUE EVERY 5 MINUTES AS NEEDED FOR CHEST PAIN. MAX OF 3 TABLETS IN 15 MINUTES. CALL 911 IF PAIN  PERSISTS. Strength: 0.3 mg, Disp: 200 tablet, Rfl: 2   OXYGEN, Inhale 2 L into the lungs continuous., Disp: , Rfl:    pantoprazole (PROTONIX) 20 MG tablet, Take 1  tablet (20 mg total) by mouth daily., Disp: 30 tablet, Rfl: 6   Potassium Chloride ER 20 MEQ TBCR, TAKE 1 TABLET BY MOUTH DAILY (Patient taking differently: Take 20 mEq by mouth daily.), Disp: 90 tablet, Rfl: 3   Pseudoeph-Doxylamine-DM-APAP (NYQUIL PO), Take by mouth as needed., Disp: , Rfl:    tetrahydrozoline-zinc (VISINE-AC) 0.05-0.25 % ophthalmic solution, Place 1 drop into both eyes 3 (three) times daily as needed (dry / irritated eyes)., Disp: , Rfl:    tiZANidine (ZANAFLEX) 2 MG tablet, Take 1 tablet (2 mg total) by mouth daily as needed for muscle spasms., Disp: 30 tablet, Rfl: 1   topiramate (TOPAMAX) 25 MG capsule, Take 3 capsules (75 mg  total) by mouth 2 (two) times daily., Disp: 180 capsule, Rfl: 11  Cardiovascular and other pertinent studies:  Mobile cardiac telemetry 10 days 03/16/2022 - 03/26/2022: Dominant rhythm: Sinus. HR 51-109 bpm. Avg HR 74 bpm, in sinus rhythm. 42% Afib burden, ventricular rate 65-152 bpm (avg of 99 bpm).  0 episodes of SVT. <1% isolated SVE, couplets. 0 episodes of VT. <1% isolated VE, no couplet/triplets. No SVT/VT/high grade AV block, sinus pause >3sec noted. 2 patient triggered events, correlated with Afib.  Coronary angiography 03/04/2022: LM: Normal LAD: Medial calcification, minimal luminal irregularities Lcx: Normal RCA: Normal   Mildly elevated LVEDP   Complex procedure due to severe tortuosity requiring the use of long sheath and multiple catheters.   No obstructive CAD. Given nitrate responsive pain, consider using long acting nitrate, as it could help with coronary vasospasm or endothelial dysfunction.      EKG 02/10/2021: Sinus rhythm 80 npm Leftward axis Poor R wave progression No acute ischemic changes  Lexiscan Tetrofosmin stress test 12/09/2019: Lexiscan nuclear stress test performed using 1-day protocol. Stress EKG is non-diagnostic, as this is pharmacological stress test using Lexiscan. Myocardial perfusion imaging is normal. Stress LVEF 74% Low risk study.    CT Chest 11/24/2019: 1. No evidence for pulmonary embolus. 2. Cardiomegaly with hazy bilateral ground-glass airspace opacities primarily at the lung bases, suggestive of an atypical infectious process such as viral pneumonia. 3. Scattered bilateral breast nodules measuring up to approximately 8 mm on the left. Correlation with outpatient mammography is recommended. 4. Partially calcified 1.5 cm left-sided thyroid nodule. Follow-up with a nonemergent outpatient thyroid ultrasound is recommended for further evaluation if this has not already been performed.(Ref: J Am Coll Radiol. 2015 Feb;12(2):  143-50).  Echocardiogram 08/25/2019: 1. Left ventricular ejection fraction, by estimation, is 65 to 70%. The left ventricle has normal function.  LV endocardial border not optimally defined to evaluate regional wall motion. There is mild concentric left ventricular hypertrophy.  Indeterminate diastolic filling due to E-A fusion.  2. Right ventricular systolic function is normal. The right ventricular size is mildly enlarged.  Tricuspid regurgitation signal is inadequate for assessing PA pressure.  3. The mitral valve is grossly normal. No evidence of mitral valve regurgitation. No evidence of mitral stenosis.  4. The aortic valve is grossly normal. Aortic valve regurgitation is not visualized. No aortic stenosis is present.   CT Chest 04/09/2015: 1. Mild airspace opacity within the left lower lung lobe may reflect atelectasis or possibly mild residual pneumonia.  Minimal atelectasis at the medial aspect of the right middle lobe. Lungs otherwiseclear. 2. Scattered coronary artery calcification seen. 3. Left renal cyst, with mild associated peripheral calcification.  Cardiac catheterization 2014 [facility], [place]): 1. Angiographically normal coronaries 2. LVEDP 20 mm Hg (mildly increased)   Recent labs:  03/07/2022: Glucose 147, BUN/Cr 28/1.26. EGFR 46. Na/K 142/4.0. Rest of the CMP normal H/H 13/43. MCV 90. Platelets 229 Lipoprotein (a) 81  11/2021: HbA1C 9.9%    Review of Systems  Cardiovascular:  Positive for dyspnea on exertion (Chronic, stable). Negative for chest pain, leg swelling, palpitations and syncope.  Neurological:  Positive for light-headedness. Negative for dizziness.        Vitals:   05/07/22 1010  BP: 127/69  Pulse: 87  Resp: 16  SpO2: 95%    Body mass index is 40.68 kg/m. Filed Weights   05/07/22 1010  Weight: 237 lb (107.5 kg)   Orthostatic VS for the past 72 hrs (Last 3 readings):  Patient Position BP Location Cuff Size  05/07/22 1010  Sitting Left Arm Large     Objective:   Physical Exam Vitals and nursing note reviewed.  Constitutional:      General: She is not in acute distress. Neck:     Vascular: No JVD.  Cardiovascular:     Rate and Rhythm: Normal rate and regular rhythm.     Heart sounds: Normal heart sounds. No murmur heard. Pulmonary:     Effort: Pulmonary effort is normal.     Breath sounds: Normal breath sounds. No wheezing or rales.  Musculoskeletal:     Right lower leg: No edema.     Left lower leg: No edema.         Assessment & Recommendations:    71 y.o. African American female with hypertension, type 2 DM with retinopathy, PAF, COPD, hyperlipidemia, CKD stage III, OSA, obesity, breast nodule, chronic migraines  PAF:  42% Afib burden, no significant symptoms. Multiple risk factors. Rate control approach reasonable. Continue diltiazem. CHA2DS2VASc score 4, annual score risk 4.8%. Continue Eliquis 5 mg bid.   Chest pain: No CAD. Slow flow improved with nitrates. If recurrent chest pain, could consider adding nitrate.   Hypertension:  Controlled    Nigel Mormon, MD Pager: [phone] Office: [phone]

## 2022-05-11 ENCOUNTER — Other Ambulatory Visit: Payer: Self-pay | Admitting: Internal Medicine

## 2022-05-11 DIAGNOSIS — I10 Essential (primary) hypertension: Secondary | ICD-10-CM

## 2022-05-14 ENCOUNTER — Telehealth: Payer: Self-pay | Admitting: Internal Medicine

## 2022-05-14 NOTE — Telephone Encounter (Signed)
Noted. Will route message to provider nurse to see if she has seen the form if not we can contact DME and have them re-send form to office.

## 2022-05-14 NOTE — Telephone Encounter (Addendum)
Called [employer].  Spoke with Janett Billow.  She verified that the POC form was faxed today, 05/14/22.  I looked on the fax.  No POC forms received regarding Heidi [name].  Requested [place] fax the form again.  Verified fax number.  Will give to Dr. Shearon Stalls once received.  Tried to call patient to inform we have contacted [employer] and are working on POC form.  No answer and unable to leave a voicemail.

## 2022-05-18 ENCOUNTER — Telehealth: Payer: Self-pay | Admitting: Pharmacist

## 2022-05-18 NOTE — Progress Notes (Signed)
[facility])  [place] Team    05/18/2022  Heidi [name] [birth year] [id number]  Reason for referral: Medication Assistance  Referral source:  2024 Re-Enrollment  Current insurance: [facility]  Outreach:  Successful telephone call with Heidi [name].  HIPAA identifiers verified.    Medication Assistance Findings:  Medication assistance needs identified: Jardiance   Extra Help:  Not eligible for Extra Help Low Income Subsidy based on reported income and assets  Patient Assistance Programs: Jardiance  made by [employer] requirement met: Yes Out-of-pocket prescription expenditure met:   Not Applicable Patient has met application requirements to apply for this program.   Plan: I will route patient assistance letter to [place] technician who will coordinate patient assistance program application process for medications listed above.  [facility] pharmacy technician will assist with obtaining all required documents from both patient and provider(s) and submit application(s) once completed.   Loretha Brasil, PharmD [place] Pharmacist Office: [phone]

## 2022-05-20 ENCOUNTER — Telehealth: Payer: Self-pay | Admitting: Neurology

## 2022-05-20 ENCOUNTER — Telehealth: Payer: Self-pay | Admitting: Pharmacy Technician

## 2022-05-20 DIAGNOSIS — Z596 Low income: Secondary | ICD-10-CM

## 2022-05-20 NOTE — Progress Notes (Addendum)
[facility] [facility])                                            [place] Team    05/20/2022  Heidi [name] 01-Jun-1951 [id number]                                      Medication Assistance Referral-FOR 2024 RE ENROLLMENT  Referral From: [place]  Medication/Company: Vania Rea / BI Patient application portion:  Mailed Provider application portion: Faxed  to Dr. Karle Plumber Provider address/fax verified via: Office website                                      Medication/Company: Eliquis / BMS Patient application portion:  N/A patient brought to [facility] and Claiborne Billings, [place] faxed to [facility] Provider application portion:  N/A Claiborne Billings, CPhT had MD sign in clinic  to Dr. Karle Plumber Provider address/fax verified via: Office website   Christinamarie Tall P. Geordie Nooney, [place]  [phone]

## 2022-05-20 NOTE — Telephone Encounter (Signed)
LVM and sent appointment reminder in mail informing pt of appointment change from 07/16/22 to 07/20/22 due to template change

## 2022-05-22 ENCOUNTER — Ambulatory Visit: Payer: Self-pay

## 2022-05-22 ENCOUNTER — Telehealth: Payer: Medicare Other

## 2022-05-22 NOTE — Telephone Encounter (Signed)
Routing to PCP for review.

## 2022-05-22 NOTE — Telephone Encounter (Signed)
  Chief Complaint: Painful red right eye, left eye also starting to hurt Symptoms: above Frequency: 3-4 days Pertinent Negatives: Patient denies fever, trauma to eye Disposition: '[]'$ ED /'[]'$ Urgent Care (no appt availability in office) / '[]'$ Appointment(In office/virtual)/ '[x]'$  [place] [facility]/ '[]'$ Home Care/ '[]'$ Refused Recommended Disposition /'[]'$ [place] [facility]/ '[]'$  Follow-up with PCP Additional Notes: Pt has a red painful right eye for the past 3-4 days. Pt states that left eye is starting to hurt now as well.  Pt would like a refill of medication that was helpful in the past when she had this before.  Medication is Patanol 0.1%. This medication was very helpful. Pt was given Naphazoline - Glycerin while in the hospital and pt stated it was good, but not as good as the Patanol.  Pt wanted Dr. Wynetta Emery to refill w/o appt. If possible.     Reason for Disposition  [1] Eye pain AND [2] persists > 1 hour since irrigation (regardless of duration of flushing)  MODERATE eye pain or discomfort (e.g., interferes with normal activities or awakens from sleep; more than mild)  Answer Assessment - Initial Assessment Questions 1. TYPE OF FOREIGN BODY: "What got in the eye?"      Feels like grit in right eye, red and sore, left eye is painful 2. ONSET: "When did it happen?"      3-4 days 3. MECHANISM: "How did it happen?"      unsure 4. VISION: "Do you have blurred vision?"      On right side 5. PAIN: "Is it painful?" If Yes, ask: "How bad is the pain?"  (Scale 1-10; or mild, moderate, severe)     Moderate 6. CONTACTS: "Do you wear contacts?"     no 7. OTHER SYMPTOMS: "Do you have any other symptoms?"     teary 8. PREGNANCY: "Is there any chance you are pregnant?" "When was your last menstrual period?"  Answer Assessment - Initial Assessment Questions 1. LOCATION: Location: "What's red, the eyeball or the outer eyelids?" (Note: when callers say the eye is red, they usually mean the sclera is  red)       eyeball 2. REDNESS OF SCLERA: "Is the redness in one or both eyes?" "When did the redness start?"       3. ONSET: "When did the eye become red?" (e.g., hours, days)       4. EYELIDS: "Are the eyelids red or swollen?" If Yes, ask: "How much?"       5. VISION: "Is there any difficulty seeing clearly?"      Right eye yes 6. ITCHING: "Does it feel itchy?" If so ask: "How bad is it" (e.g., Scale 1-10; or mild, moderate, severe)      7. PAIN: "Is there any pain? If Yes, ask: "How bad is it?" (e.g., Scale 1-10; or mild, moderate, severe)     moderate 8. CONTACT LENS: "Do you wear contacts?"     no 9. CAUSE: "What do you think is causing the redness?"     Same as before 10. OTHER SYMPTOMS: "Do you have any other symptoms?" (e.g., fever, runny nose, cough, vomiting)  Protocols used: Eye - Foreign Body-A-AH, Eye - Red Without Pus-A-AH

## 2022-05-22 NOTE — Telephone Encounter (Signed)
Called and LVM for patient to be seen at an Urgent Care.

## 2022-05-25 DIAGNOSIS — J45909 Unspecified asthma, uncomplicated: Secondary | ICD-10-CM | POA: Diagnosis not present

## 2022-05-25 DIAGNOSIS — I10 Essential (primary) hypertension: Secondary | ICD-10-CM | POA: Diagnosis not present

## 2022-05-29 ENCOUNTER — Other Ambulatory Visit: Payer: Self-pay | Admitting: Internal Medicine

## 2022-05-29 DIAGNOSIS — I10 Essential (primary) hypertension: Secondary | ICD-10-CM

## 2022-05-29 NOTE — Telephone Encounter (Signed)
Copied from [place] [id number]. Topic: General - Other >> May 29, 2022 10:36 AM [name] wrote: Reason for CRM: Medication Refill - Medication: Potassium Chloride ER 20 MEQ TBCR [[id number]]  Has the patient contacted their pharmacy? Yes.   (Agent: If no, request that the patient contact the pharmacy for the refill. If patient does not wish to contact the pharmacy document the reason why and proceed with request.) (Agent: If yes, when and what did the pharmacy advise?)  Preferred Pharmacy (with phone number or street name): [facility] [place], [place] [address] Phone: [phone] Fax: [phone] Hours: Open 24 hours   Has the patient been seen for an appointment in the last year OR does the patient have an upcoming appointment? Yes.    Agent: Please be advised that RX refills may take up to 3 business days. We ask that you follow-up with your pharmacy.

## 2022-06-01 MED ORDER — POTASSIUM CHLORIDE ER 20 MEQ PO TBCR: 20.0000 meq | EXTENDED_RELEASE_TABLET | Freq: Every day | ORAL | 0 refills | Status: DC

## 2022-06-01 NOTE — Telephone Encounter (Signed)
Requested Prescriptions  Pending Prescriptions Disp Refills   Potassium Chloride ER 20 MEQ TBCR 90 tablet 0    Sig: Take 20 mEq by mouth daily.     Endocrinology:  Minerals - Potassium Supplementation Failed - 05/29/2022 11:10 AM      Failed - Cr in normal range and within 360 days    Creat  Date Value Ref Range Status  05/12/2016 1.14 (H) 0.50 - 0.99 mg/dL Final    Comment:      For patients > or = 70 years of age: The upper reference limit for Creatinine is approximately 13% higher for people identified as African-American.      Creatinine, Ser  Date Value Ref Range Status  03/07/2022 1.26 (H) 0.44 - 1.00 mg/dL Final   Creatinine, Urine  Date Value Ref Range Status  11/27/2019 146.00 mg/dL Final    Comment:    Performed at [facility], [place] [address], [place], [place] [address]         Passed - K in normal range and within 360 days    Potassium  Date Value Ref Range Status  03/07/2022 4.0 3.5 - 5.1 mmol/L Final         Passed - Valid encounter within last 12 months    Recent Outpatient Visits           1 month ago Hospital discharge follow-up   [place], MD   6 months ago Type 2 diabetes mellitus with morbid obesity [facility])   [place], MD   8 months ago No-show for appointment   [place] Ladell Pier, MD   9 months ago Type 2 diabetes mellitus with morbid obesity [facility])   [place] Ladell Pier, MD   9 months ago Uncontrolled type 2 diabetes mellitus with hyperglycemia [facility])   [place] Gildardo Pounds, NP       Future Appointments             In 1 month Wynetta Emery Dalbert Batman, MD [place]   In 2 months Wynetta Emery, Dalbert Batman, MD [place]   In 5 months Patwardhan, Reynold Bowen, MD [place] [facility]

## 2022-06-03 DIAGNOSIS — J45909 Unspecified asthma, uncomplicated: Secondary | ICD-10-CM | POA: Diagnosis not present

## 2022-06-03 DIAGNOSIS — Z961 Presence of intraocular lens: Secondary | ICD-10-CM | POA: Diagnosis not present

## 2022-06-03 DIAGNOSIS — H02135 Senile ectropion of left lower eyelid: Secondary | ICD-10-CM | POA: Diagnosis not present

## 2022-06-03 DIAGNOSIS — H40051 Ocular hypertension, right eye: Secondary | ICD-10-CM | POA: Diagnosis not present

## 2022-06-03 DIAGNOSIS — H02132 Senile ectropion of right lower eyelid: Secondary | ICD-10-CM | POA: Diagnosis not present

## 2022-06-03 DIAGNOSIS — H04123 Dry eye syndrome of bilateral lacrimal glands: Secondary | ICD-10-CM | POA: Diagnosis not present

## 2022-06-05 ENCOUNTER — Telehealth: Payer: Self-pay | Admitting: Internal Medicine

## 2022-06-05 NOTE — Telephone Encounter (Signed)
Copied from [place] [id number]. Topic: General - Inquiry >> Jun 05, 2022  1:38 PM Marcellus Scott wrote: Reason for CRM: Pt stated she received a letter from the [facility] stating a copy of the letter was sent to [place] with instructions for renewal in regards to her medication, apixaban (ELIQUIS) 5 MG TABS tablet. Pt mentioned in the letter that assistance ends on 07/05/22, and she needs to renew.  Pt is requesting follow-up and would like to know if this was received and is being taken care of, as she really needs her medication.  Please advise.

## 2022-06-08 ENCOUNTER — Other Ambulatory Visit: Payer: Self-pay

## 2022-06-08 ENCOUNTER — Telehealth: Payer: Self-pay | Admitting: Pharmacist

## 2022-06-08 NOTE — Telephone Encounter (Signed)
Have you seen anything for this patient?

## 2022-06-08 NOTE — Telephone Encounter (Signed)
Once reviewing patient chart she has reached out to [facility]: Lambert Rhonda W and they informed her of the documents that she needs to get her medication.

## 2022-06-08 NOTE — Progress Notes (Signed)
[facility])                                            [place] Team    06/08/2022  Heidi [name] Mar 14, 1951 [id number]  Telephone call received from [name] regarding questions for patient assistance programs for Jardiance (BI) and Eliquis ( BMS).   Jardiance requires the social security benefits statement for the 2024 year for proof of household income and renewal for the 2024 year. Advised patient to place a refill for Jardiance from prescription bottle issued to her from [facility]. Advised that once the 2024 statement arrives, mail it to [employer] with her application.   Eliquis requires that [name] spends 3% of her household income on prescription drugs under her Part D plan prior to reapplication. Advised patient that once [facility] receives her social security benefits letter for 2024, we can notify her of what the 3% spend will be.   Patient verbalized understanding and all questions were answered.   Loretha Brasil, PharmD [place] Pharmacist Office: [phone]

## 2022-06-10 ENCOUNTER — Other Ambulatory Visit: Payer: Self-pay | Admitting: Internal Medicine

## 2022-06-10 DIAGNOSIS — M545 Low back pain, unspecified: Secondary | ICD-10-CM

## 2022-06-22 ENCOUNTER — Telehealth: Payer: Self-pay | Admitting: Neurology

## 2022-06-22 NOTE — Telephone Encounter (Signed)
Chronic Migraine CPT 64615  Botox J0585 Units:200  G43.709 Chronic Migraine without aura, not intractable, without status migrainous   

## 2022-06-22 NOTE — Telephone Encounter (Addendum)
Spoke with patient. She says she now has [employer] Gold Plus ([place])  Effective date: 07/06/2022 ID: [id number] RXID: [id number] RXBIN: [id number] RXPCN: [id number] RXGROUP: [id number]  Provider line: [phone]  She had this information on paper but she said she misplaced her card. She will call [employer] and ask for a replacement. She was then asked to submit it to [place] for scanning once she gets it.

## 2022-06-22 NOTE — Telephone Encounter (Signed)
Pt is scheduled for botox injection for 07/20/22 and will need a new PA before appointment. Pt has two PA's for botox and one expires on 06/25/22

## 2022-06-23 ENCOUNTER — Other Ambulatory Visit: Payer: Self-pay

## 2022-06-24 DIAGNOSIS — J45909 Unspecified asthma, uncomplicated: Secondary | ICD-10-CM | POA: Diagnosis not present

## 2022-06-24 DIAGNOSIS — I10 Essential (primary) hypertension: Secondary | ICD-10-CM | POA: Diagnosis not present

## 2022-07-02 ENCOUNTER — Telehealth: Payer: Self-pay | Admitting: Internal Medicine

## 2022-07-02 DIAGNOSIS — M545 Low back pain, unspecified: Secondary | ICD-10-CM

## 2022-07-02 NOTE — Telephone Encounter (Signed)
Pt is taking tiZANidine (ZANAFLEX) 2 MG tablet [[id number]]  But would like to increase to '4mg'$  and taking 3x's a day /pain is not getting better with the '2mg'$ s/  please advise

## 2022-07-02 NOTE — Telephone Encounter (Signed)
Dose change will have to be approved by the provider. Forwarding to her for review.

## 2022-07-03 DIAGNOSIS — J45909 Unspecified asthma, uncomplicated: Secondary | ICD-10-CM | POA: Diagnosis not present

## 2022-07-03 MED ORDER — TIZANIDINE HCL 2 MG PO TABS: 2.0000 mg | ORAL_TABLET | Freq: Two times a day (BID) | ORAL | 1 refills | Status: DC | PRN

## 2022-07-03 NOTE — Telephone Encounter (Signed)
Pt. Given message from PCP. Has been taking it one time a day, so if she "has increased it to 2 times a day, could she let my pharmacy know?" Please advise pt.

## 2022-07-03 NOTE — Addendum Note (Signed)
Addended by: Karle Plumber B on: 07/03/2022 04:28 PM   Modules accepted: Orders

## 2022-07-06 ENCOUNTER — Other Ambulatory Visit: Payer: Self-pay | Admitting: Internal Medicine

## 2022-07-07 ENCOUNTER — Telehealth: Payer: Self-pay | Admitting: Internal Medicine

## 2022-07-08 ENCOUNTER — Telehealth: Payer: Self-pay | Admitting: Internal Medicine

## 2022-07-08 ENCOUNTER — Other Ambulatory Visit: Payer: Self-pay | Admitting: Pharmacist

## 2022-07-08 ENCOUNTER — Other Ambulatory Visit: Payer: Self-pay

## 2022-07-08 DIAGNOSIS — E1169 Type 2 diabetes mellitus with other specified complication: Secondary | ICD-10-CM

## 2022-07-08 MED ORDER — DILTIAZEM HCL ER COATED BEADS 120 MG PO CP24: ORAL_CAPSULE | ORAL | 3 refills | Status: DC

## 2022-07-08 MED ORDER — "INSULIN SYRINGE 31G X 5/16"" 1 ML MISC": 2 refills | Status: DC

## 2022-07-08 NOTE — Telephone Encounter (Signed)
Called LVM for patient to call back

## 2022-07-08 NOTE — Telephone Encounter (Signed)
Copied from [place] [id number]. Topic: General - Other >> Jul 08, 2022  9:46 AM [name] wrote: Reason for CRM: The patient has been directed by a member of their insurance company's staff to contact their PCP regarding coverage of their syringes  Additional information is needed from the office regarding the prescription for syringes  The patient has called to provide [phone] regarding their syringes  The patient would like to be contacted when contact with their insurance company has been made

## 2022-07-08 NOTE — Telephone Encounter (Signed)
Patient called in state is out of syringes. She called earlier supplied, new insurance info. Can she get a temporay supply until she gets her refill?

## 2022-07-08 NOTE — Telephone Encounter (Signed)
Patient aware that prescription was call to  her pharmacy

## 2022-07-08 NOTE — Telephone Encounter (Signed)
Requested Prescriptions  Pending Prescriptions Disp Refills   furosemide (LASIX) 40 MG tablet [Pharmacy Med Name: FUROSEMIDE '40MG'$  TABLETS] 90 tablet 0    Sig: TAKE 1 TABLET BY MOUTH DAILY     Cardiovascular:  Diuretics - Loop Failed - 07/06/2022  5:48 PM      Failed - Cr in normal range and within 180 days    Creat  Date Value Ref Range Status  05/12/2016 1.14 (H) 0.50 - 0.99 mg/dL Final    Comment:      For patients > or = 71 years of age: The upper reference limit for Creatinine is approximately 13% higher for people identified as African-American.      Creatinine, Ser  Date Value Ref Range Status  03/07/2022 1.26 (H) 0.44 - 1.00 mg/dL Final   Creatinine, Urine  Date Value Ref Range Status  11/27/2019 146.00 mg/dL Final    Comment:    Performed at [facility], [place] [address], [place], [place] [address]         Passed - K in normal range and within 180 days    Potassium  Date Value Ref Range Status  03/07/2022 4.0 3.5 - 5.1 mmol/L Final         Passed - Ca in normal range and within 180 days    Calcium  Date Value Ref Range Status  03/07/2022 9.0 8.9 - 10.3 mg/dL Final   Calcium, Ion  Date Value Ref Range Status  03/02/2022 1.20 1.15 - 1.40 mmol/L Final         Passed - Na in normal range and within 180 days    Sodium  Date Value Ref Range Status  03/07/2022 142 135 - 145 mmol/L Final  08/21/2021 142 134 - 144 mmol/L Final         Passed - Cl in normal range and within 180 days    Chloride  Date Value Ref Range Status  03/07/2022 100 98 - 111 mmol/L Final         Passed - Mg Level in normal range and within 180 days    Magnesium  Date Value Ref Range Status  03/04/2022 2.0 1.7 - 2.4 mg/dL Final    Comment:    Performed at [facility], [place] [address], [place], [place] [address]         Passed - Last BP in normal range    BP Readings from Last 1 Encounters:  05/07/22 127/69         Passed - Valid encounter within last 6 months     Recent Outpatient Visits           2 months ago Hospital discharge follow-up   [place], MD   8 months ago Type 2 diabetes mellitus with morbid obesity [facility])   [place], MD   9 months ago No-show for appointment   [place] Ladell Pier, MD   10 months ago Type 2 diabetes mellitus with morbid obesity [facility])   [place] Ladell Pier, MD   10 months ago Uncontrolled type 2 diabetes mellitus with hyperglycemia [facility])   [place] Gildardo Pounds, NP       Future Appointments             In 3 weeks Ladell Pier, MD [facility]  [place]   In 1 month Wynetta Emery, Dalbert Batman, MD [place]   In 3 months Patwardhan, Reynold Bowen, MD [facility]

## 2022-07-08 NOTE — Telephone Encounter (Signed)
Rx sent for syringes to patient's pharmacy.

## 2022-07-08 NOTE — Telephone Encounter (Signed)
Called and spoke with pt who states due to her new insurance, she is needing to switch to [employer] for her O2 and is wanting to get a POC.  Stated to pt that we would need her to come in for an OV so we can also walk her to make sure we had all the correct information to be able to send to [employer] for the POC order and she verbalized understanding. Appt scheduled for pt. Nothing further needed.

## 2022-07-08 NOTE — Telephone Encounter (Signed)
Copied from [place] [id number]. Topic: General - Other >> Jul 08, 2022  9:51 AM [name] wrote: Reason for CRM: The patient has been instructed by their insurance company [employer] to contact their PCP and request samples of their empagliflozin (JARDIANCE) 10 MG TABS tablet [[id number]]  Please contact the patient further when possible

## 2022-07-10 ENCOUNTER — Other Ambulatory Visit (HOSPITAL_COMMUNITY): Payer: Self-pay

## 2022-07-10 ENCOUNTER — Telehealth: Payer: Self-pay | Admitting: Internal Medicine

## 2022-07-10 NOTE — Telephone Encounter (Signed)
Called & spoke to the patient. Verified name & DOB. Informed of message below. Patient expressed verbal understanding. 

## 2022-07-10 NOTE — Telephone Encounter (Signed)
Patient Advocate Encounter  Prior Authorization for Botox 200UNIT solution has been approved.    PA# [id number] Key: [id number] Effective dates: 07/10/2022 through 07/06/2023  Can be filled at [place], [place] [place] Patient Advocate Team Direct Number: [phone]  Fax: [phone]

## 2022-07-10 NOTE — Telephone Encounter (Signed)
Called & spoke to the patient. Verified name & DOB. Informed patient that a free trial card is available with Vania Rea is available for 30 days. Advised that there is financial assistance available at the pharmacy for medication needed after the free trial. Gave card information. Patient repeated back information for accuracy & expressed verbal understanding.

## 2022-07-10 NOTE — Telephone Encounter (Signed)
Heidi [name],   Is this something we are helping with or Cardiology?

## 2022-07-10 NOTE — Telephone Encounter (Addendum)
Patranel from [employer] is calling stating that they received the physician portion of the paper work but is needing the patients portion of the paperwork to be faxed over to them so that they can do the renewal application for the patient. This is needed in order for the patients apixaban (ELIQUIS) 5 MG TABS tablet   Phone Number: [phone] Fax Number: [phone]    Please advise

## 2022-07-13 ENCOUNTER — Other Ambulatory Visit (HOSPITAL_COMMUNITY): Payer: Self-pay

## 2022-07-13 ENCOUNTER — Other Ambulatory Visit: Payer: Self-pay

## 2022-07-13 MED ORDER — BOTOX 200 UNITS IJ SOLR
INTRAMUSCULAR | 3 refills | Status: DC
  Filled 2022-07-13: qty 1, 90d supply, fill #0

## 2022-07-13 NOTE — Telephone Encounter (Signed)
Botox Rx sent to [facility] outpatient pharmacy.

## 2022-07-13 NOTE — Addendum Note (Signed)
Addended by: Gildardo Griffes on: 07/13/2022 07:34 AM   Modules accepted: Orders

## 2022-07-13 NOTE — Telephone Encounter (Signed)
Is it possible to check and see if this is B/B & SP or strictly SP? She's medicare and has typically been B/B but this is also new insurance.

## 2022-07-15 NOTE — Telephone Encounter (Signed)
I have sent a message to our billing department about this.

## 2022-07-16 ENCOUNTER — Ambulatory Visit: Payer: Medicare Other | Admitting: Neurology

## 2022-07-16 NOTE — Telephone Encounter (Signed)
Pt is scheduled for botox on 07/20/22 and has over $2000 worth of debt that has accumulated since 2020. Pt expressed in previous encounter on 01/14/22 her frustration over her debt and may need a call from billing to further discuss debt before making next botox appointment.

## 2022-07-20 ENCOUNTER — Ambulatory Visit: Payer: Medicare HMO | Admitting: Neurology

## 2022-07-21 ENCOUNTER — Ambulatory Visit: Payer: Medicare HMO | Admitting: Primary Care

## 2022-07-23 ENCOUNTER — Telehealth: Payer: Self-pay | Admitting: Neurology

## 2022-07-23 MED ORDER — UBRELVY 100 MG PO TABS: ORAL_TABLET | ORAL | 5 refills | Status: DC

## 2022-07-23 NOTE — Telephone Encounter (Signed)
Ubrelvy ordered.  May require PA

## 2022-07-23 NOTE — Telephone Encounter (Signed)
After speaking with Heidi [name] in billing.  Pt has balance needs to address prior to anymore botox (for now.).

## 2022-07-23 NOTE — Telephone Encounter (Signed)
Pt called wanting to know if Nurtec or Heidi [name] can be called in for her to the [facility] on [address] while her BOTOX situation is settled. Please advise.

## 2022-07-23 NOTE — Addendum Note (Signed)
Addended by: Brandon Melnick on: 07/23/2022 11:36 AM   Modules accepted: Orders

## 2022-07-28 ENCOUNTER — Telehealth: Payer: Self-pay | Admitting: Internal Medicine

## 2022-07-28 ENCOUNTER — Other Ambulatory Visit: Payer: Self-pay

## 2022-07-28 DIAGNOSIS — M545 Low back pain, unspecified: Secondary | ICD-10-CM

## 2022-07-28 MED ORDER — TIZANIDINE HCL 2 MG PO TABS: 2.0000 mg | ORAL_TABLET | Freq: Two times a day (BID) | ORAL | 1 refills | Status: DC | PRN

## 2022-07-28 NOTE — Telephone Encounter (Signed)
Copied from [place] [id number]. Topic: General - Inquiry >> Jul 28, 2022  4:38 PM Penni Bombard wrote: Reason for CRM: pt called saying she thought Lurena Joiner was sending in the [place] to [place] about 2 weeks ago.for a discount.  She called them and they do not have anything sent in.  CB#  [phone]

## 2022-07-28 NOTE — Telephone Encounter (Signed)
Pts medication was dropped down the drain of her sink as getting repaired / pt has no more tiZANidine (ZANAFLEX) 2 MG tablet/ pts pharmacy advised her to have Dr. Wynetta Emery send new RX / please advise asap / pt has no more medication   [place], [place]

## 2022-07-29 ENCOUNTER — Other Ambulatory Visit: Payer: Self-pay | Admitting: Pharmacist

## 2022-07-29 ENCOUNTER — Other Ambulatory Visit: Payer: Self-pay

## 2022-07-29 MED ORDER — EMPAGLIFLOZIN 10 MG PO TABS: 10.0000 mg | ORAL_TABLET | Freq: Every day | ORAL | 3 refills | Status: DC

## 2022-07-29 MED ORDER — EMPAGLIFLOZIN 10 MG PO TABS
10.0000 mg | ORAL_TABLET | Freq: Every day | ORAL | 3 refills | Status: DC
  Filled 2022-07-29 – 2022-07-30 (×3): qty 30, 30d supply, fill #0

## 2022-07-29 NOTE — Telephone Encounter (Signed)
Rx sent 

## 2022-07-29 NOTE — Telephone Encounter (Signed)
Pt stated she was advised that she would have to pick up the medication empagliflozin (JARDIANCE) 10 MG TABS tablet at [place] at [facility]. This way, her medication would be covered. Medication was sent to [facility], [address] AT [name]  Stated would like her medication transferred to [facility]; this way, her medication is covered. Pt stated she took her last tablet today.  Please advise.

## 2022-07-29 NOTE — Addendum Note (Signed)
Addended by: Daisy Blossom, Daylan Juhnke L on: 07/29/2022 10:30 AM   Modules accepted: Orders

## 2022-07-30 ENCOUNTER — Ambulatory Visit: Payer: Medicare Other | Admitting: Internal Medicine

## 2022-07-30 ENCOUNTER — Other Ambulatory Visit: Payer: Self-pay

## 2022-07-30 NOTE — Telephone Encounter (Signed)
Copied from [place] [phone]. Topic: General - Other >> Jul 30, 2022  3:06 PM [place] B wrote: Reason for CRM: Patient called in about financial counseling. Please call back

## 2022-07-30 NOTE — Telephone Encounter (Signed)
Pt is calling because she is requesting Dr. Wynetta Emery to let the pharmacy know that it is ok to fill her medication early. tiZANidine (ZANAFLEX) 2 MG tablet Medication is not due until February 20th

## 2022-08-03 ENCOUNTER — Encounter: Payer: Self-pay | Admitting: Primary Care

## 2022-08-03 ENCOUNTER — Ambulatory Visit (INDEPENDENT_AMBULATORY_CARE_PROVIDER_SITE_OTHER): Payer: Medicare HMO | Admitting: Primary Care

## 2022-08-03 ENCOUNTER — Telehealth: Payer: Self-pay | Admitting: Primary Care

## 2022-08-03 VITALS — BP 138/78 | HR 84 | Temp 98.3°F | Ht 64.0 in | Wt 230.0 lb

## 2022-08-03 DIAGNOSIS — G4733 Obstructive sleep apnea (adult) (pediatric): Secondary | ICD-10-CM | POA: Diagnosis not present

## 2022-08-03 DIAGNOSIS — J4540 Moderate persistent asthma, uncomplicated: Secondary | ICD-10-CM

## 2022-08-03 DIAGNOSIS — J9611 Chronic respiratory failure with hypoxia: Secondary | ICD-10-CM | POA: Diagnosis not present

## 2022-08-03 NOTE — Patient Instructions (Addendum)
Recommendations Continue Symbicort 2 puffs twice daily Continue albuterol 2 puffs every 4-6 hours as needed for breakthrough shortness of breath or wheezing Take Mucinex 600 mg 1-2 times daily as needed for chest congestion Continue to use 1 to 2 L supplemental oxygen as needed with exertion and at bedtime We will place an order for portable oxygen concentrator  Orders: PFTs (order already exists) Portable oxygen concentration/ changing from [place] to [facility]   Follow-up: 3 months with Dr. Desai/ needs 1 hour PFT prior   Home Oxygen Use, Adult When a medical condition keeps you from getting enough oxygen, your health care provider may instruct you to take extra oxygen at home. Your health care provider will let you know: When to take oxygen. How long to take oxygen. How quickly oxygen should be delivered (flow rate), in liters per minute (LPM or L/M). Home oxygen can be given through: A mask. A nasal cannula. This is a device or tube that goes in the nostrils. A transtracheal catheter. This is a small, thin tube placed in the windpipe (trachea). A breathing tube (tracheostomy tube) that is surgically placed in the windpipe. This may be used in severe cases. These devices are connected with tubing to an oxygen source, such as: A tank. Tanks hold oxygen in gas form. They must be replaced when the oxygen is used up. A liquid oxygen device. This holds oxygen in liquid form. Liquid oxygen is very cold. It must be replaced when the oxygen is used up. An oxygen concentrator machine. This filters oxygen in the room. There are two types of oxygen concentrator machines--stationary and portable. A stationary oxygen concentrator machine plugs into the main electricity supply at your home. You must have a backup cylinder of oxygen in case the power goes out. A portable oxygen concentrator machine is smaller in size and more lightweight. This machine uses battery supply and can be used outside the  home. Work with your health care provider to find equipment that works best for you and your lifestyle. What are the risks? Delivery of supplemental oxygen is generally safe. However, some risks include: Fire. This can happen if the oxygen is exposed to a heat source, flame, or spark. Injury to skin. This can happen if liquid oxygen touches your skin. Damage to the lungs or other organs. This can happen from getting too little or too much oxygen. Supplies needed: To use oxygen, you will need: A mask, nasal cannula, transtracheal catheter, or tracheostomy. An oxygen tank, a liquid oxygen device, or an oxygen concentrator. The tape that your health care provider recommends (optional). Your health care provider may also recommend: A humidifier to warm and moisten the oxygen delivered. This will depend on how much oxygen you need and the type of home oxygen device you use. A pulse oximeter. This device measures the percentage of oxygen in your blood. How to use oxygen Your health care provider or a person from your [place] will show you how to use your oxygen device. Follow his or her instructions. The instructions may look something like this: Wash your hands with soap and water. If you use an oxygen concentrator, make sure it is plugged in. Place one end of the tube into the port on the tank, device, or machine. Place the mask over your nose and mouth. Or, place the nasal cannula and secure it with tape if instructed. If you use a tracheostomy or transtracheal catheter, connect it to the oxygen source as directed. Make  sure the liter-flow setting on the machine is at the level prescribed by your health care provider. Turn on the machine or adjust the knob on the tank or device to the correct liter-flow setting. When you are done, turn off and unplug the machine, or turn the knob to OFF. How to clean and care for the oxygen supplies Nasal cannula Clean it with a warm, wet cloth  daily or as needed. Wash it with a liquid soap once a week. Rinse it thoroughly once or twice a week. Air-dry it. Replace it every 2-4 weeks. If you have an infection, such as a cold or pneumonia, change the cannula when you get better. Mask Replace it every 2-4 weeks. If you have an infection, such as a cold or pneumonia, change the mask when you get better. Humidifier bottle Wash the bottle between each refill: Wash it with soap and warm water. Rinse it thoroughly. Clean it and its top with a disinfectant cleaner. Air-dry it. Make sure it is dry before you refill it. Oxygen concentrator Clean the air filter at least twice a week according to directions from your home medical equipment and service company. Wipe down the cabinet every day. To do this: Unplug the unit. Wipe down the cabinet with a damp cloth. Dry the cabinet. Other equipment Change any extra tubing every 1-3 months. Follow instructions from your health care provider about taking care of any other equipment. Safety tips Fire safety tips  Keep your oxygen and oxygen supplies at least 6 ft (2 m) away from sources of heat, flames, and sparks at all times. Do not allow smoking near your oxygen. Put up "no smoking" signs in your home. Avoid smoking areas when in public. Do not use materials that can burn (are flammable) while you use oxygen. This includes: Petroleum jelly. Hair spray or other aerosol sprays. Rubbing alcohol. Hand sanitizer. When you go to a restaurant with portable oxygen, ask to be seated in the non-smoking section. Keep a Data processing manager close by. Let your fire department know that you have oxygen in your home. Test your home smoke detectors regularly. Traveling Secure your oxygen tank in the vehicle so that it does not move around. Follow instructions from your medical device company about how to safely secure your tank. Make sure you have enough oxygen for the amount of time you will be away  from home. If you are planning to travel by public transportation (airplane, train, bus, or boat), contact the company to find out if it allows the use of an approved portable oxygen concentrator. You may also need documents from your health care provider and medical device company before you travel. General safety tips If you use an oxygen cylinder, make sure it is in a stand or secured to an object that will not move (fixed object). If you use liquid oxygen, make sure its container is kept upright at all times. If you use an oxygen concentrator: Tell Loss adjuster, chartered company. Make sure you are given priority service in the event that your power goes out. Avoid using extension cords if possible. Follow these instructions at home: Use oxygen only as told by your health care provider. Do not use alcohol or other drugs that make you relax (sedating drugs) unless instructed. They can slow down your breathing rate and make it hard to get in enough oxygen. Know how and when to order a refill of oxygen. Always keep a spare tank of oxygen. Plan ahead for holidays when  you may not be able to get a prescription filled. Use water-based lubricants on your lips or nostrils. Do not use oil-based products like petroleum jelly. To prevent skin irritation on your cheeks or behind your ears, tuck some gauze under the tubing. Where to find more information American Lung Association: DiabeticMale.de Contact a health care provider if: You get headaches often. You have a lasting cough. You are restless or have anxiety. You develop an illness that affects your breathing. You cannot exercise at your regular level. You have a fever. You have persistent redness under your nose. Get help right away if: You are confused. You are sleepy all the time. You have blue lips or fingernails. You have difficult or irregular breathing that is getting worse. You are struggling to breathe. These symptoms may represent a  serious problem that is an emergency. Do not wait to see if the symptoms will go away. Get medical help right away. Call your local emergency services (911 in the [place] Do not drive yourself to the hospital. Summary Your health care provider or a person from your [place] will show you how to use your oxygen device. Follow his or her instructions. If you use an oxygen concentrator, make sure it is plugged in. Make sure the liter-flow setting on the machine is at the level prescribed by your health care provider. Use oxygen only as told by your health care provider. Keep your oxygen and oxygen supplies at least 6 ft (2 m) away from sources of heat, flames, and sparks at all times. This information is not intended to replace advice given to you by your health care provider. Make sure you discuss any questions you have with your health care provider. Document Revised: 10/23/2021 Document Reviewed: 06/20/2019 Elsevier Patient Education  [place].

## 2022-08-03 NOTE — Assessment & Plan Note (Addendum)
-  Stable; Not acutely exacerbated. No recent exacerbations or prednisone. Lungs were clear on exam.  - Continue Symbicort 83mg two puffs twice daily and PRN Albuterol 2 puffs every 4-6 hours for sob/chest tightness - FU in 3 months or sooner

## 2022-08-03 NOTE — Progress Notes (Signed)
$'@Patient'd$  ID: Heidi [name], female    DOB: 01/24/1951, 72 y.o.   MRN: [id number]  Chief Complaint  Patient presents with   Follow-up    Wants to get a POC for ambulation.  Patient has [facility] but would like to get an In[facility] POC thru [facility].    Referring provider: Ladell Pier, MD  HPI: 72 year old female, never smoked.  Past medical history significant for cardiomegaly, chronic diastolic heart failure, hypertension, paroxysmal A-fib, OSA treated with BiPAP, chronic respiratory failure, moderate persistent asthma, GERD, type 2 diabetes, chronic kidney disease, obesity.  Patient of Dr. Shearon [name], last seen on 04/23/2022.  08/03/2022 [name]  She needs to be walked for POC. Hx asthma. She is doing well today. She has some chest congestion. She is getting up clear mucus. She is maintained on Symbicort 844m 2 puffs twice daily and as needed albuterol. She was started on oxygen in August 2021. She wears 1-2 L with exertion and at night. She is interested in getting portable oxygen concentrator.DME is [facility]. She has been with them for 5 years she believes.  Symptoms- Chest congestion/ productive cough with clear mucus  Maintenance- Symbicort [id number] puffs twice daily Prednisone- None  SABA use- twice a day  02 use- 1-2L continuous oxygen   Allergies  Allergen Reactions   Januvia [Sitagliptin]     Chest pains   Tramadol Other (See Comments)    hallucinations    Immunization History  Administered Date(s) Administered   Fluad Quad(high Dose 65+) 04/10/2022   Influenza Whole 05/11/2014   Influenza, High Dose Seasonal PF 04/08/2018, 02/25/2019   Influenza,inj,Quad PF,6+ Mos 03/26/2015, 05/12/2016, 04/18/2017   Pneumococcal Conjugate-13 01/14/2015   Pneumococcal Polysaccharide-23 12/19/2013, 11/13/2019   Tdap 11/20/2016    Past Medical History:  Diagnosis Date   Adenomatous colon polyp    Anemia    Anxiety    Asthma    CHF  (congestive heart failure) (HCC)    COPD [place]ch[place] obstructive pulmonary disease) ([place]  Diabetes mellitus without complication (HCC)    High cholesterol    Hypertension    Left knee injury    Pneumonia    Sleep apnea    Supplemental oxygen dependent    2L [place]    Tobacco History: Social History   Tobacco Use  Smoking Status Never   Passive exposure: Never  Smokeless Tobacco Never   Counseling given: Not Answered   Outpatient Medications Prior to Visit  Medication Sig Dispense Refill   acetaminophen (TYLENOL) 500 MG tablet Take 500-1,000 mg by mouth every 6 (six) hours as needed (pain).     albuterol (PROVENTIL) (2.5 MG/3ML) 0.083% nebulizer solution Take 3 mLs (2.5 mg total) by nebulization every 6 (six) hours as needed for wheezing or shortness of breath. 75 mL 5   albuterol (VENTOLIN HFA) 108 (90 Base) MCG/ACT inhaler Inhale 1-2 puffs into the lungs every 4 (four) hours as needed for wheezing or shortness of breath. 8 g 5   apixaban (ELIQUIS) 5 MG TABS tablet Take 1 tablet (5 mg total) by mouth 2 (two) times daily. 60 tablet 0   atenolol (TENORMIN) 25 MG tablet TAKE 1 TABLET(25 MG) BY MOUTH DAILY 90 tablet 0   atorvastatin (LIPITOR) 40 MG tablet TAKE 1 TABLET BY MOUTH DAILY AT 6 PM (Patient taking differently: Take 40 mg by mouth every evening.) 90 tablet 2   botulinum toxin Type A (BOTOX) 200 units injection Provider to inject 155 units  into the muscles of the head and neck every 12 weeks. Discard remainder. 1 each 3   budesonide-formoterol (SYMBICORT) 80-4.5 MCG/ACT inhaler Inhale 2 puffs into the lungs in the morning and at bedtime. 1 each 5   diltiazem (CARDIZEM CD) 120 MG 24 hr capsule TAKE 1 CAPSULE(120 MG) BY MOUTH DAILY 90 capsule 3   empagliflozin (JARDIANCE) 10 MG TABS tablet Take 1 tablet (10 mg total) by mouth daily. 30 tablet 3   furosemide (LASIX) 40 MG tablet TAKE 1 TABLET BY MOUTH DAILY 90 tablet 0   insulin lispro protamine-lispro (HUMALOG MIX 75/25) (75-25) 100  UNIT/ML SUSP injection Inject 50 units in the morning and 40 units in the evening.     Insulin Syringe-Needle U-100 (INSULIN SYRINGE 1CC/31GX5/16") 31G X 5/16" 1 ML MISC Use to inject Humalog 75/25 mix twice daily. Dx: E11.69 200 each 2   loratadine (CLARITIN) 10 MG tablet Take 1 tablet (10 mg total) by mouth daily as needed for allergies. 90 tablet 0   losartan (COZAAR) 50 MG tablet Take 0.5 tablets (25 mg total) by mouth daily.     montelukast (SINGULAIR) 10 MG tablet Take 1 tablet (10 mg total) by mouth at bedtime. 90 tablet 2   Multiple Vitamin (MULTIVITAMIN WITH MINERALS) TABS tablet Take 1 tablet by mouth in the morning. Centrum Silver     nitroGLYCERIN (NITROSTAT) 0.3 MG SL tablet DISSOLVE 1 TABLET UNDER THE  TONGUE EVERY 5 MINUTES AS NEEDED FOR CHEST PAIN. MAX OF 3 TABLETS IN 15 MINUTES. CALL 911 IF PAIN  PERSISTS. Strength: 0.3 mg 200 tablet 2   OXYGEN Inhale 2 L into the lungs continuous.     pantoprazole (PROTONIX) 20 MG tablet Take 1 tablet (20 mg total) by mouth daily. 30 tablet 6   Potassium Chloride ER 20 MEQ TBCR Take 20 mEq by mouth daily. 90 tablet 0   Pseudoeph-Doxylamine-DM-APAP (NYQUIL PO) Take by mouth as needed.     tetrahydrozoline-zinc (VISINE-AC) 0.05-0.25 % ophthalmic solution Place 1 drop into both eyes 3 (three) times daily as needed (dry / irritated eyes).     tiZANidine (ZANAFLEX) 2 MG tablet Take 1 tablet (2 mg total) by mouth 2 (two) times daily as needed for muscle spasms. Each prescription to last 1 mth. 40 tablet 1   topiramate (TOPAMAX) 25 MG capsule Take 3 capsules (75 mg total) by mouth 2 (two) times daily. 180 capsule 11   Ubrogepant (UBRELVY) 100 MG TABS Take 1 tablet onset migraine, may take another tablet if needed 2 hours later. (2 tab max/daily) 16 tablet 5   No facility-administered medications prior to visit.   Review of Systems  Review of Systems  Constitutional: Negative.   HENT: Negative.    Respiratory: Negative.     Physical Exam  BP  138/78 (BP Location: Right Arm, Patient Position: Sitting, Cuff Size: Large)   Pulse 84   Temp 98.3 F (36.8 C) (Oral)   Ht '5\' 4"'$  (1.626 m)   Wt 230 lb (104.3 kg)   SpO2 96% Comment: room air  BMI 39.48 kg/m  Physical Exam Constitutional:      Appearance: Normal appearance.  HENT:     Head: Normocephalic and atraumatic.     Mouth/Throat:     Mouth: Mucous membranes are moist.  Cardiovascular:     Rate and Rhythm: Normal rate and regular rhythm.  Pulmonary:     Effort: Pulmonary effort is normal.     Breath sounds: Normal breath sounds. No wheezing, rhonchi  or rales.  Musculoskeletal:        General: Normal range of motion.     Cervical back: Normal range of motion and neck supple.  Skin:    General: Skin is warm and dry.  Neurological:     General: No focal deficit present.     Mental Status: She is alert and oriented to person, place, and time. Mental status is at baseline.  Psychiatric:        Mood and Affect: Mood normal.        Behavior: Behavior normal.        Thought Content: Thought content normal.        Judgment: Judgment normal.      Lab Results:  CBC    Component Value Date/Time   WBC 8.9 03/07/2022 0239   RBC 4.76 03/07/2022 0239   HGB 13.4 03/07/2022 0239   HGB 13.3 08/30/2020 1141   HCT 43.0 03/07/2022 0239   HCT 41.9 08/30/2020 1141   PLT 229 03/07/2022 0239   PLT 128 (L) 08/30/2020 1141   MCV 90.3 03/07/2022 0239   MCV 88 08/30/2020 1141   MCH 28.2 03/07/2022 0239   MCHC 31.2 03/07/2022 0239   RDW 15.3 03/07/2022 0239   RDW 15.2 08/30/2020 1141   LYMPHSABS 1.7 03/03/2022 0551   LYMPHSABS 4.3 (H) 08/23/2017 1655   MONOABS 0.3 03/03/2022 0551   EOSABS 0.2 03/03/2022 0551   EOSABS 0.4 08/23/2017 1655   BASOSABS 0.0 03/03/2022 0551   BASOSABS 0.1 08/23/2017 1655    BMET    Component Value Date/Time   NA 142 03/07/2022 0406   NA 142 08/21/2021 1522   K 4.0 03/07/2022 0406   CL 100 03/07/2022 0406   CO2 31 03/07/2022 0406   GLUCOSE  147 (H) 03/07/2022 0406   BUN 28 (H) 03/07/2022 0406   BUN 17 08/21/2021 1522   CREATININE 1.26 (H) 03/07/2022 0406   CREATININE 1.14 (H) 05/12/2016 1648   CALCIUM 9.0 03/07/2022 0406   GFRNONAA 46 (L) 03/07/2022 0406   GFRNONAA 51 (L) 05/12/2016 1648   GFRAA 56 (L) 08/30/2020 1141   GFRAA 59 (L) 05/12/2016 1648    BNP    Component Value Date/Time   BNP 12.4 03/02/2022 2318    ProBNP No results found for: "PROBNP"  Imaging: No results found.   Assessment & Plan:   Moderate persistent asthma without complication - Stable; Not acutely exacerbated. No recent exacerbations or prednisone. Lungs were clear on exam.  - Continue Symbicort 70mg two puffs twice daily and PRN Albuterol 2 puffs every 4-6 hours for sob/chest tightness - FU in 3 months or sooner  Chronic respiratory failure with hypoxia (HCC) - Desaturated to 87% on ambulatory walk test in office. Qualified for POC, needs to wear 2L with exertion. [facility].    EMartyn Ehrich NP 08/03/2022

## 2022-08-03 NOTE — Assessment & Plan Note (Signed)
-  Ordered for split night sleep study back in September which has not yet been completed. Not currently on PAP therapy. Wearing oxygen at bedtime.

## 2022-08-03 NOTE — Telephone Encounter (Signed)
Can we follow-up on scheduling split night sleep study that was ordered back in September by Heidi [name]

## 2022-08-03 NOTE — Telephone Encounter (Signed)
Thank you :)

## 2022-08-03 NOTE — Assessment & Plan Note (Signed)
-  Desaturated to 87% on ambulatory walk test in office. Qualified for POC, needs to wear 2L with exertion. Changing from [place] to [facility].

## 2022-08-04 ENCOUNTER — Other Ambulatory Visit: Payer: Self-pay | Admitting: Internal Medicine

## 2022-08-04 ENCOUNTER — Other Ambulatory Visit: Payer: Self-pay

## 2022-08-04 ENCOUNTER — Telehealth: Payer: Self-pay | Admitting: Pharmacist

## 2022-08-04 DIAGNOSIS — I10 Essential (primary) hypertension: Secondary | ICD-10-CM

## 2022-08-04 DIAGNOSIS — E1169 Type 2 diabetes mellitus with other specified complication: Secondary | ICD-10-CM

## 2022-08-04 MED ORDER — ACCU-CHEK GUIDE VI STRP: ORAL_STRIP | 12 refills | Status: DC

## 2022-08-04 MED ORDER — ALCOHOL SWABS 70 % PADS: MEDICATED_PAD | 3 refills | Status: DC

## 2022-08-04 MED ORDER — ACCU-CHEK GUIDE W/DEVICE KIT: PACK | 0 refills | Status: AC

## 2022-08-04 MED ORDER — ACCU-CHEK SOFTCLIX LANCETS MISC: 12 refills | Status: DC

## 2022-08-04 MED ORDER — ACCU-CHEK GUIDE CONTROL VI LIQD: 1 refills | Status: AC

## 2022-08-04 MED ORDER — POTASSIUM CHLORIDE ER 20 MEQ PO TBCR: 20.0000 meq | EXTENDED_RELEASE_TABLET | Freq: Every day | ORAL | 1 refills | Status: DC

## 2022-08-04 NOTE — Telephone Encounter (Signed)
Approved Authorization [id number]   Valid til 11/01/22   Calling to get it scheduled now and will make the pt aware

## 2022-08-04 NOTE — Progress Notes (Unsigned)
[facility])                                            [place] Team    08/04/2022  Heidi [name] 13-Nov-1950 [id number]  Returned call to [name], advised her that a one time 15 day supply of Eliquis can be picked up from [employer] on [address]. Advised that this will be a one time supply to allow her time to submit patient assistance paper work and income documents to [facility]. Patient has found the paper work for [employer] at this time. I reviewed each page of the Eliquis and Jardiance applications that I need her to submit to [facility]. Patient reports that she will have this faxed to [place], however did not commit to a date. I again advised patient that she will be required to spend 3% of her income on prescriptions in order to be approved for Eliquis. Patient again verbalized understanding.   Will await patient applications.   Loretha Brasil, PharmD [place] Clinical Pharmacist Direct Dial: [phone]

## 2022-08-04 NOTE — Progress Notes (Signed)
[facility] [facility])                                            [place] Team    08/04/2022  Heidi [name] Aug 27, 1950 [id number]  Received telephone call from [place] inquiring about medications Eliquis and Jardiance. I advised patient that [facility] has been waiting for her to return her signed patient portion for Jardiance along with 2024 SSA benefit letter so that [facility] can submit to [facility] for re-enrollment for 2024 for Jardiance assistance.  I have advised patient multiple times that she is required to spend 3% of her income on prescription drugs in order to be approved for Eliquis re-enrollment.   Patient reports that she does not have the Jardiance application. I have spoken with the patient in December 2023 and answered questions regarding the application process and she has spoken with one of our CPhTs in early January 2024 regarding turning in her application. She did not previously inform that she did not have the application in her possession until today. A new application for Jardiance will be sent to her home this week. Address verified.   Patient reports that she does have her portion of the Elquis application. I have instructed patient to mail or fax the paper application and copy of her 2024 SSA benefits letter to [facility]. Advised that [facility] will submit her portion, again advised that she will be required to spend  3% of her income on prescription drugs before [facility] will approve her for the Eliquis. I gave patient our fax number again today. Patient states that she will have her daughter fax the Eliquis application and her proof of income.  Patient reports that she is almost out of Eliquis. I reached out to her PCP office and a  one time 15 day supply of Eliqus will be provided to her. Patient must purchase Eliquis and Jardiance at her regular co-pay price after this.   Patient has a long standing history of non compliance with  submitting her portion of patient assistance paperwork.

## 2022-08-05 ENCOUNTER — Telehealth: Payer: Self-pay | Admitting: *Deleted

## 2022-08-05 ENCOUNTER — Other Ambulatory Visit: Payer: Self-pay | Admitting: Internal Medicine

## 2022-08-05 DIAGNOSIS — I10 Essential (primary) hypertension: Secondary | ICD-10-CM

## 2022-08-05 MED ORDER — TOPIRAMATE 25 MG PO CPSP: 75.0000 mg | ORAL_CAPSULE | Freq: Two times a day (BID) | ORAL | 1 refills | Status: DC

## 2022-08-05 NOTE — Telephone Encounter (Signed)
Requested Prescriptions  Pending Prescriptions Disp Refills   atenolol (TENORMIN) 25 MG tablet [Pharmacy Med Name: ATENOLOL '25MG'$  TABLETS] 90 tablet 0    Sig: TAKE 1 TABLET(25 MG) BY MOUTH DAILY     Cardiovascular: Beta Blockers 2 Failed - 08/05/2022  6:24 AM      Failed - Cr in normal range and within 360 days    Creat  Date Value Ref Range Status  05/12/2016 1.14 (H) 0.50 - 0.99 mg/dL Final    Comment:      For patients > or = 71 years of age: The upper reference limit for Creatinine is approximately 13% higher for people identified as African-American.      Creatinine, Ser  Date Value Ref Range Status  03/07/2022 1.26 (H) 0.44 - 1.00 mg/dL Final   Creatinine, Urine  Date Value Ref Range Status  11/27/2019 146.00 mg/dL Final    Comment:    Performed at [facility], [place] [address] [place], [place] [address]         Passed - Last BP in normal range    BP Readings from Last 1 Encounters:  08/03/22 138/78         Passed - Last Heart Rate in normal range    Pulse Readings from Last 1 Encounters:  08/03/22 84         Passed - Valid encounter within last 6 months    Recent Outpatient Visits           3 months ago Hospital discharge follow-up   [place], [place]   9 months ago Type 2 diabetes mellitus with morbid obesity [facility])   [place] Ladell Pier, MD   10 months ago No-show for appointment   [facility] Karle Plumber B, MD   11 months ago Type 2 diabetes mellitus with morbid obesity [facility])   [place] Karle Plumber B, MD   11 months ago Uncontrolled type 2 diabetes mellitus with hyperglycemia [facility])   [place], Vernia Buff, NP       Future Appointments             In 6 days Ladell Pier, MD [place]    In 3 months Pat[place]han, Reynold Bowen, MD [facility]

## 2022-08-05 NOTE — Telephone Encounter (Signed)
Received PA for Topiramate 25 mg capsules for tier exception request. I called the pharmacy. There is a refill ready for pickup since 07/30/22 and the cost is $45 for 30 days. I called the pt. She said she was supposed to be getting Topiramate from [place] and she believes it should be free. I went ahead and sent a 90 day refill there. I called [facility]. I was told the estimated at [facility] co-pay for a 3 month supply of 25 mg capsules is $125 for 90 days but that could change if [facility] reverses the claim from 1/25. She wasn't able to process a test claim for topiramate tablets because a PA would be needed.   Topiramate 25 mg

## 2022-08-05 NOTE — Telephone Encounter (Signed)
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 She is either losing tizanidine or requesting early refills. Therefore, I wanted you to be aware before we approved any additional refills.

## 2022-08-05 NOTE — Telephone Encounter (Signed)
Patient states Heidi [name] requires PA. Please complete. Patient states it is helpful.

## 2022-08-05 NOTE — Addendum Note (Signed)
Addended by: Gildardo Griffes on: 08/05/2022 04:15 PM   Modules accepted: Orders

## 2022-08-05 NOTE — Telephone Encounter (Signed)
Alex from pharmacy states pt lost her medication tiZANidine (ZANAFLEX) 2 MG tablet   Per Alex a 40 day supply was sent to the pharmacy on 07/28/2022 and is wanting to know if it is ok to fill since it is too early. If so will their be another prescription sent over since that will not be enough medication.    Please advise

## 2022-08-06 ENCOUNTER — Other Ambulatory Visit: Payer: Self-pay | Admitting: Internal Medicine

## 2022-08-06 ENCOUNTER — Other Ambulatory Visit (HOSPITAL_COMMUNITY): Payer: Self-pay

## 2022-08-06 DIAGNOSIS — E1169 Type 2 diabetes mellitus with other specified complication: Secondary | ICD-10-CM

## 2022-08-06 DIAGNOSIS — I10 Essential (primary) hypertension: Secondary | ICD-10-CM

## 2022-08-06 NOTE — Telephone Encounter (Signed)
Pharmacy Patient Advocate Encounter  Prior Authorization for Heidi [name] '100MG'$  tablets has been approved.    PA# PA Case ID: [id number] Effective dates: 07/06/2022 through 07/06/2023 Key: [id number]

## 2022-08-06 NOTE — Telephone Encounter (Signed)
Requested medication (s) are due for refill today: yes  Requested medication (s) are on the active medication list: yes  Last refill:  03/07/22  Future visit scheduled: yes  Notes to clinic:  Unable to refill per protocol, last refill by another provider.      Requested Prescriptions  Pending Prescriptions Disp Refills   losartan (COZAAR) 50 MG tablet [Pharmacy Med Name: LOSARTAN '50MG'$  TABLETS] 90 tablet     Sig: TAKE 1 TABLET(50 MG) BY MOUTH DAILY     Cardiovascular:  Angiotensin Receptor Blockers Failed - 08/06/2022  3:58 AM      Failed - Cr in normal range and within 180 days    Creat  Date Value Ref Range Status  05/12/2016 1.14 (H) 0.50 - 0.99 mg/dL Final    Comment:      For patients > or = 71 years of age: The upper reference limit for Creatinine is approximately 13% higher for people identified as African-American.      Creatinine, Ser  Date Value Ref Range Status  03/07/2022 1.26 (H) 0.44 - 1.00 mg/dL Final   Creatinine, Urine  Date Value Ref Range Status  11/27/2019 146.00 mg/dL Final    Comment:    Performed at [facility], [place] [address] [place], [place] [address]         Passed - K in normal range and within 180 days    Potassium  Date Value Ref Range Status  03/07/2022 4.0 3.5 - 5.1 mmol/L Final         Passed - Patient is not pregnant      Passed - Last BP in normal range    BP Readings from Last 1 Encounters:  08/03/22 138/78         Passed - Valid encounter within last 6 months    Recent Outpatient Visits           3 months ago Hospital discharge follow-up   [place], MD   9 months ago Type 2 diabetes mellitus with morbid obesity [facility])   [place] Ladell Pier, MD   10 months ago No-show for appointment   [facility] Karle Plumber B, MD   11 months ago Type 2 diabetes mellitus with morbid obesity  [facility])   [place] Karle Plumber B, MD   11 months ago Uncontrolled type 2 diabetes mellitus with hyperglycemia [facility])   [place]ain, Vernia Buff, NP       Future Appointments             In 1 month Wynetta Emery, Dalbert Batman, MD [place]   In 3 months Patwardhan, Reynold Bowen, MD [facility]            Signed Prescriptions Disp Refills   atorvastatin (LIPITOR) 40 MG tablet 90 tablet 0    Sig: Take 1 tablet (40 mg total) by mouth every evening.     Cardiovascular:  Antilipid - Statins Failed - 08/06/2022  3:58 AM      Failed - Lipid Panel in normal range within the last 12 months    Cholesterol, Total  Date Value Ref Range Status  08/21/2021 120 100 - 199 mg/dL Final   LDL Chol Calc (NIH)  Date Value Ref Range Status  08/21/2021 50 0 -  99 mg/dL Final   HDL  Date Value Ref Range Status  08/21/2021 53 >39 mg/dL Final   Triglycerides  Date Value Ref Range Status  08/21/2021 91 0 - 149 mg/dL Final         Passed - Patient is not pregnant      Passed - Valid encounter within last 12 months    Recent Outpatient Visits           3 months ago Hospital discharge follow-up   [place], MD   9 months ago Type 2 diabetes mellitus with morbid obesity [facility])   [place] Ladell Pier, MD   10 months ago No-show for appointment   [facility] Karle Plumber B, MD   11 months ago Type 2 diabetes mellitus with morbid obesity [facility])   [place] Karle Plumber B, MD   11 months ago Uncontrolled type 2 diabetes mellitus with hyperglycemia [facility])   [place], Vernia Buff, NP       Future Appointments             In 1 month Wynetta Emery, Dalbert Batman, MD  [place]   In 3 months Patwardhan, Reynold Bowen, MD [facility]

## 2022-08-06 NOTE — Telephone Encounter (Signed)
Tried to reach pt to discuss Topiramate. LVM. Will try her again on Monday when office reopens.

## 2022-08-06 NOTE — Telephone Encounter (Signed)
[facility]. Confirmed Roselyn Meier claim processes for a $24 copay.

## 2022-08-06 NOTE — Telephone Encounter (Signed)
Requested Prescriptions  Pending Prescriptions Disp Refills   atorvastatin (LIPITOR) 40 MG tablet [Pharmacy Med Name: ATORVASTATIN '40MG'$  TABLETS] 90 tablet 0    Sig: Take 1 tablet (40 mg total) by mouth every evening.     Cardiovascular:  Antilipid - Statins Failed - 08/06/2022  3:58 AM      Failed - Lipid Panel in normal range within the last 12 months    Cholesterol, Total  Date Value Ref Range Status  08/21/2021 120 100 - 199 mg/dL Final   LDL Chol Calc (NIH)  Date Value Ref Range Status  08/21/2021 50 0 - 99 mg/dL Final   HDL  Date Value Ref Range Status  08/21/2021 53 >39 mg/dL Final   Triglycerides  Date Value Ref Range Status  08/21/2021 91 0 - 149 mg/dL Final         Passed - Patient is not pregnant      Passed - Valid encounter within last 12 months    Recent Outpatient Visits           3 months ago Hospital discharge follow-up   [place], MD   9 months ago Type 2 diabetes mellitus with morbid obesity [facility])   [place] Ladell Pier, MD   10 months ago No-show for appointment   [facility] Karle Plumber B, MD   11 months ago Type 2 diabetes mellitus with morbid obesity [facility])   [place] Karle Plumber B, MD   11 months ago Uncontrolled type 2 diabetes mellitus with hyperglycemia [facility])   [place], Vernia Buff, NP       Future Appointments             In 1 month Ladell Pier, MD [place]   In 3 months Patwardhan, Reynold Bowen, MD [facility]             losartan (COZAAR) 50 MG tablet [Pharmacy Med Name: LOSARTAN '50MG'$  TABLETS] 90 tablet     Sig: TAKE 1 TABLET(50 MG) BY MOUTH DAILY     Cardiovascular:  Angiotensin Receptor Blockers Failed - 08/06/2022  3:58 AM      Failed - Cr in  normal range and within 180 days    Creat  Date Value Ref Range Status  05/12/2016 1.14 (H) 0.50 - 0.99 mg/dL Final    Comment:      For patients > or = 71 years of age: The upper reference limit for Creatinine is approximately 13% higher for people identified as African-American.      Creatinine, Ser  Date Value Ref Range Status  03/07/2022 1.26 (H) 0.44 - 1.00 mg/dL Final   Creatinine, Urine  Date Value Ref Range Status  11/27/2019 146.00 mg/dL Final    Comment:    Performed at [facility], [place] [address], [place], [place] [address]         Passed - K in normal range and within 180 days    Potassium  Date Value Ref Range Status  03/07/2022 4.0 3.5 - 5.1 mmol/L Final         Passed - Patient is not pregnant      Passed - Last BP in normal range    BP Readings from Last 1 Encounters:  08/03/22 138/78  Passed - Valid encounter within last 6 months    Recent Outpatient Visits           3 months ago Hospital discharge follow-up   [place], MD   9 months ago Type 2 diabetes mellitus with morbid obesity [facility])   [place] Ladell Pier, MD   10 months ago No-show for appointment   [facility] Karle Plumber B, MD   11 months ago Type 2 diabetes mellitus with morbid obesity [facility])   [place] Karle Plumber B, MD   11 months ago Uncontrolled type 2 diabetes mellitus with hyperglycemia [facility])   [place] Gildardo Pounds, NP       Future Appointments             In 1 month Wynetta Emery, Dalbert Batman, MD [place]   In 3 months Patwardhan, Reynold Bowen, MD [facility]

## 2022-08-07 ENCOUNTER — Telehealth: Payer: Self-pay | Admitting: Pharmacy Technician

## 2022-08-07 DIAGNOSIS — Z596 Low income: Secondary | ICD-10-CM

## 2022-08-07 NOTE — Progress Notes (Signed)
[facility])                                            [place] Team    08/07/2022  Heidi [name] Sep 19, 1950 [id number]  Received both patient and provider portion(s) of patient assistance application(s) for Jardiance and Eliquis. Faxed completed application and required documents into BI and BMS.    Heidi [name] Heidi [name], [name]  [phone]

## 2022-08-11 ENCOUNTER — Telehealth: Payer: Self-pay | Admitting: Pharmacy Technician

## 2022-08-11 ENCOUNTER — Other Ambulatory Visit: Payer: Self-pay

## 2022-08-11 ENCOUNTER — Ambulatory Visit: Payer: Medicare Other | Admitting: Internal Medicine

## 2022-08-11 DIAGNOSIS — Z596 Low income: Secondary | ICD-10-CM

## 2022-08-11 MED ORDER — TOPIRAMATE 25 MG PO TABS: ORAL_TABLET | ORAL | 1 refills | Status: DC

## 2022-08-11 MED ORDER — TOPIRAMATE 50 MG PO TABS: ORAL_TABLET | ORAL | 1 refills | Status: DC

## 2022-08-11 NOTE — Telephone Encounter (Signed)
LM for pt to return call about topiramate tablets (instructions).  Order sent to [place].

## 2022-08-11 NOTE — Telephone Encounter (Signed)
Please do thank you !

## 2022-08-11 NOTE — Progress Notes (Signed)
[facility] [facility])                                            [place] Team    08/11/2022  Heidi [name] [birth year] [id number]  2 care coordination calls placed to [facility] and BMS for Jardiance and Eliquis respectively.  Spoke to [employer] at [facility] in regard to [name] application and she informs patient is APPROVED 08/07/22-07/06/23. Initial shipment is in progress but patient will have to call BI for subsequent refills by calling [phone] approximately 10 days before running out of medication.  Spoke to [place] at [facility] in regard to Eliquis application. She informs patient has been DENIED as patient has not met the 3% OOP requirement. Patient can submit an OOP printout from her pharmacy for a redetermination.  Heidi [name] P. [name], [place]  [phone]

## 2022-08-11 NOTE — Telephone Encounter (Signed)
Spoke with patient today. She is amenable to switching to whichever form of Topiramate (either tablets vs capsules) and combination of 25 mg/50 mg if it will be cheaper. She was under the impression that her 25 mg capsules will be free and she states she spoke with someone Friday night. I called [facility] again and talked to [facility]'. I was told the copay for the Topiramate 25 mg sprinkle capsules is $125 and that is what they are waiting on before shipment.

## 2022-08-11 NOTE — Addendum Note (Signed)
Addended by: Brandon Melnick on: 08/11/2022 04:26 PM   Modules accepted: Orders

## 2022-08-11 NOTE — Addendum Note (Signed)
Addended by: Gildardo Griffes on: 08/11/2022 04:13 PM   Modules accepted: Orders

## 2022-08-12 ENCOUNTER — Other Ambulatory Visit: Payer: Self-pay

## 2022-08-12 ENCOUNTER — Other Ambulatory Visit: Payer: Self-pay | Admitting: Internal Medicine

## 2022-08-12 ENCOUNTER — Telehealth: Payer: Self-pay | Admitting: Pharmacist

## 2022-08-12 DIAGNOSIS — I10 Essential (primary) hypertension: Secondary | ICD-10-CM

## 2022-08-12 DIAGNOSIS — H40051 Ocular hypertension, right eye: Secondary | ICD-10-CM | POA: Diagnosis not present

## 2022-08-12 DIAGNOSIS — Z961 Presence of intraocular lens: Secondary | ICD-10-CM | POA: Diagnosis not present

## 2022-08-12 DIAGNOSIS — H04123 Dry eye syndrome of bilateral lacrimal glands: Secondary | ICD-10-CM | POA: Diagnosis not present

## 2022-08-12 DIAGNOSIS — E1169 Type 2 diabetes mellitus with other specified complication: Secondary | ICD-10-CM

## 2022-08-12 DIAGNOSIS — E113211 Type 2 diabetes mellitus with mild nonproliferative diabetic retinopathy with macular edema, right eye: Secondary | ICD-10-CM | POA: Diagnosis not present

## 2022-08-12 DIAGNOSIS — H40059 Ocular hypertension, unspecified eye: Secondary | ICD-10-CM

## 2022-08-12 HISTORY — DX: Ocular hypertension, unspecified eye: H40.059

## 2022-08-12 LAB — HM DIABETES EYE EXAM

## 2022-08-12 MED ORDER — LOSARTAN POTASSIUM 50 MG PO TABS: ORAL_TABLET | ORAL | 1 refills | Status: DC

## 2022-08-12 MED ORDER — DILTIAZEM HCL ER COATED BEADS 120 MG PO CP24: ORAL_CAPSULE | ORAL | 3 refills | Status: DC

## 2022-08-12 MED ORDER — ATENOLOL 25 MG PO TABS: ORAL_TABLET | ORAL | 2 refills | Status: DC

## 2022-08-12 MED ORDER — FUROSEMIDE 40 MG PO TABS: ORAL_TABLET | ORAL | 2 refills | Status: DC

## 2022-08-12 MED ORDER — ATORVASTATIN CALCIUM 40 MG PO TABS: 40.0000 mg | ORAL_TABLET | Freq: Every evening | ORAL | 2 refills | Status: DC

## 2022-08-12 NOTE — Progress Notes (Addendum)
[place] [facility])                                            [place] Team    08/12/2022  Heidi [name] 1950/08/07 [id number]  Placed telephonic outreach to Heidi [name] regarding her denial from [facility] patient assistance. Patient verbalized understanding that she will be responsible for co-pay for Eliquis monthly until she has satisfied the out of pocket spend requirement.  I have encouraged patient to call me back once she has reached the out of pocket spend requirement and we will continue with her assistance for Eliquis. Also informed her we are available to assist with other medication assistance if that should occur.  Additionally, I informed the patient that she was approved for the [facility] 08/07/22-07/06/23. Initial shipment is in progress but patient will have to call BI for subsequent refills by calling [phone] approximately 10 days before running out of medication. Patient verbalized understanding.   Patient was appreciative of call.   Loretha Brasil, PharmD [place] Clinical Pharmacist Direct Dial: [phone]

## 2022-08-12 NOTE — Telephone Encounter (Signed)
I spoke with the patient and discussed the instructions of the Topiramate tablets (50 mg and 25 mg) that were sent to [place]. Pt verbalized understanding that she will take 1 tablet of each twice daily for total of 75 mg twice daily. Pt's questions were answered.

## 2022-08-13 ENCOUNTER — Other Ambulatory Visit (HOSPITAL_COMMUNITY): Payer: Self-pay

## 2022-08-14 ENCOUNTER — Other Ambulatory Visit: Payer: Self-pay

## 2022-08-16 ENCOUNTER — Other Ambulatory Visit: Payer: Self-pay | Admitting: Internal Medicine

## 2022-08-16 DIAGNOSIS — M545 Low back pain, unspecified: Secondary | ICD-10-CM

## 2022-08-17 NOTE — Telephone Encounter (Signed)
Requested medication (s) are due for refill today: yes  Requested medication (s) are on the active medication list: yes  Last refill:  07/28/22  Future visit scheduled: yes  Notes to clinic:  Unable to refill per protocol, cannot delegate.      Requested Prescriptions  Pending Prescriptions Disp Refills   tiZANidine (ZANAFLEX) 2 MG tablet [Pharmacy Med Name: TIZANIDINE 2MG TABLETS] 40 tablet 1    Sig: TAKE 1 TABLET(2MG) BY MOUTH TWICE DAILY AS NEEDED FOR MUSCLE SPASMS** 40 TABLETS TO LAST ONE MONTH     Not Delegated - Cardiovascular:  Alpha-2 Agonists - tizanidine Failed - 08/16/2022  2:37 PM      Failed - This refill cannot be delegated      Passed - Valid encounter within last 6 months    Recent Outpatient Visits           4 months ago Hospital discharge follow-up   [place], MD   9 months ago Type 2 diabetes mellitus with morbid obesity [facility])   [place] Ladell Pier, MD   10 months ago No-show for appointment   [facility] Karle Plumber B, MD   11 months ago Type 2 diabetes mellitus with morbid obesity [facility])   [place] Karle Plumber B, MD   12 months ago Uncontrolled type 2 diabetes mellitus with hyperglycemia [facility])   [place], Vernia Buff, NP       Future Appointments             In 1 month Wynetta Emery, Dalbert Batman, MD [place]   In 2 months Patwardhan, Reynold Bowen, MD [facility]

## 2022-08-18 ENCOUNTER — Encounter (HOSPITAL_BASED_OUTPATIENT_CLINIC_OR_DEPARTMENT_OTHER): Payer: Medicare HMO | Admitting: Pulmonary Disease

## 2022-08-18 ENCOUNTER — Other Ambulatory Visit: Payer: Self-pay

## 2022-08-18 DIAGNOSIS — H40021 Open angle with borderline findings, high risk, right eye: Secondary | ICD-10-CM | POA: Diagnosis not present

## 2022-08-18 DIAGNOSIS — H40051 Ocular hypertension, right eye: Secondary | ICD-10-CM | POA: Diagnosis not present

## 2022-08-18 DIAGNOSIS — E103511 Type 1 diabetes mellitus with proliferative diabetic retinopathy with macular edema, right eye: Secondary | ICD-10-CM | POA: Diagnosis not present

## 2022-08-18 DIAGNOSIS — H04123 Dry eye syndrome of bilateral lacrimal glands: Secondary | ICD-10-CM | POA: Diagnosis not present

## 2022-08-18 LAB — HM DIABETES EYE EXAM

## 2022-08-19 ENCOUNTER — Encounter: Payer: Self-pay | Admitting: Internal Medicine

## 2022-08-28 ENCOUNTER — Telehealth: Payer: Self-pay

## 2022-08-28 ENCOUNTER — Telehealth: Payer: Self-pay | Admitting: Internal Medicine

## 2022-08-28 NOTE — Telephone Encounter (Signed)
The patient called in stating she is having difficulty getting her tiZANidine (ZANAFLEX) 2 MG tablet refill from the pharmacy. She states they need the providers office to call and authorize the permission to fill even though she took an updated prescription. She had an original prescription for 120 but the prescription was damaged and that is why she needed a new script. Please assist patient further. She uses    [facility] [place], [place] Phone: [phone]  Fax: [phone]

## 2022-08-28 NOTE — Telephone Encounter (Signed)
PT called again regarding refill of Tizanidine. She was hoping that Dr. Wynetta Emery would get to it this afternoon. Explained that rx's have a 24 hour turnaround.

## 2022-08-31 NOTE — Telephone Encounter (Signed)
Called & spoke to the patient. Verified name & DOB. Informed of message below. Patient expressed verbal understanding.

## 2022-08-31 NOTE — Telephone Encounter (Signed)
Call placed to patient unable to reach to inform that Medication has been sent to pharmacy and to reminded patient to please attend  appointment  03/18 as we can not  authorize and more refill until seen by PCP .message left on VM.

## 2022-09-01 ENCOUNTER — Ambulatory Visit (INDEPENDENT_AMBULATORY_CARE_PROVIDER_SITE_OTHER): Payer: Medicare HMO

## 2022-09-01 ENCOUNTER — Encounter (HOSPITAL_COMMUNITY): Payer: Self-pay

## 2022-09-01 ENCOUNTER — Ambulatory Visit (HOSPITAL_COMMUNITY)
Admission: EM | Admit: 2022-09-01 | Discharge: 2022-09-01 | Disposition: A | Discharge status: Home / Self Care | Payer: Medicare HMO | Attending: Internal Medicine | Admitting: Internal Medicine

## 2022-09-01 DIAGNOSIS — J441 Chronic obstructive pulmonary disease with (acute) exacerbation: Secondary | ICD-10-CM | POA: Diagnosis not present

## 2022-09-01 DIAGNOSIS — R059 Cough, unspecified: Secondary | ICD-10-CM | POA: Diagnosis not present

## 2022-09-01 DIAGNOSIS — I5032 Chronic diastolic (congestive) heart failure: Secondary | ICD-10-CM | POA: Diagnosis not present

## 2022-09-01 DIAGNOSIS — Z1152 Encounter for screening for COVID-19: Secondary | ICD-10-CM | POA: Diagnosis not present

## 2022-09-01 DIAGNOSIS — Z8679 Personal history of other diseases of the circulatory system: Secondary | ICD-10-CM

## 2022-09-01 DIAGNOSIS — Z7901 Long term (current) use of anticoagulants: Secondary | ICD-10-CM | POA: Insufficient documentation

## 2022-09-01 DIAGNOSIS — I110 Hypertensive heart disease with heart failure: Secondary | ICD-10-CM | POA: Insufficient documentation

## 2022-09-01 DIAGNOSIS — Z9981 Dependence on supplemental oxygen: Secondary | ICD-10-CM | POA: Insufficient documentation

## 2022-09-01 DIAGNOSIS — I509 Heart failure, unspecified: Secondary | ICD-10-CM | POA: Diagnosis not present

## 2022-09-01 DIAGNOSIS — R0602 Shortness of breath: Secondary | ICD-10-CM | POA: Diagnosis not present

## 2022-09-01 DIAGNOSIS — R051 Acute cough: Secondary | ICD-10-CM | POA: Diagnosis not present

## 2022-09-01 DIAGNOSIS — Z8709 Personal history of other diseases of the respiratory system: Secondary | ICD-10-CM

## 2022-09-01 MED ORDER — BENZONATATE 100 MG PO CAPS: 100.0000 mg | ORAL_CAPSULE | Freq: Three times a day (TID) | ORAL | 0 refills | Status: DC

## 2022-09-01 MED ORDER — ALBUTEROL SULFATE (2.5 MG/3ML) 0.083% IN NEBU
2.5000 mg | INHALATION_SOLUTION | Freq: Once | RESPIRATORY_TRACT | Status: AC
  Administered 2022-09-01: 2.5 mg via RESPIRATORY_TRACT

## 2022-09-01 MED ORDER — ALBUTEROL SULFATE (2.5 MG/3ML) 0.083% IN NEBU
INHALATION_SOLUTION | RESPIRATORY_TRACT | Status: AC
  Filled 2022-09-01: qty 3

## 2022-09-01 MED ORDER — IPRATROPIUM-ALBUTEROL 0.5-2.5 (3) MG/3ML IN SOLN
RESPIRATORY_TRACT | Status: AC
  Filled 2022-09-01: qty 3

## 2022-09-01 MED ORDER — AZITHROMYCIN 250 MG PO TABS: 250.0000 mg | ORAL_TABLET | Freq: Every day | ORAL | 0 refills | Status: DC

## 2022-09-01 MED ORDER — IPRATROPIUM-ALBUTEROL 0.5-2.5 (3) MG/3ML IN SOLN
3.0000 mL | Freq: Once | RESPIRATORY_TRACT | Status: AC
  Administered 2022-09-01: 3 mL via RESPIRATORY_TRACT

## 2022-09-01 MED ORDER — METHYLPREDNISOLONE SODIUM SUCC 125 MG IJ SOLR
80.0000 mg | Freq: Once | INTRAMUSCULAR | Status: AC
  Administered 2022-09-01: 80 mg via INTRAMUSCULAR

## 2022-09-01 MED ORDER — METHYLPREDNISOLONE SODIUM SUCC 125 MG IJ SOLR
INTRAMUSCULAR | Status: AC
  Filled 2022-09-01: qty 2

## 2022-09-01 NOTE — Discharge Instructions (Addendum)
COVID-19 testing is pending and will come back in the next 12 to 24 hours.  I will call you if this test is positive.  Your chest x-ray shows stable findings. We will manage this as a COPD/asthma exacerbation.  - Take azithromycin antibiotic as directed. - I gave you a shot of steroid in the clinic today to help with your inflammation and wheezing. This will continue to work in your body over the next 12 to 24 hours to reduce inflammation and wheeze. - Continue using your albuterol nebulizer/inhaler at home every 4-6 hours as needed for cough, shortness of breath, and wheezing. - You may use Tessalon Perles every 8 hours as needed for coughing.  Please schedule an appointment with your cardiologist to further discuss your congestive heart failure and heart medications.   Continue to elevate your legs to reduce swelling.   If you develop any new or worsening symptoms or do not improve in the next 2 to 3 days, please return.  If your symptoms are severe, please go to the emergency room.  Follow-up with your primary care provider for further evaluation and management of your symptoms as well as ongoing wellness visits.  I hope you feel better!

## 2022-09-01 NOTE — ED Triage Notes (Signed)
Patient c/o a productive cough with green sputum x 4 days, wheezing, and SOB present. Patient arrived wearing O2 2L/min via [place]. Patient states has been using x 2 years.  states she has been taking Nyquil, Mucinex DM, flonase, Vick's Vapor Rub, albuterol inhaler.

## 2022-09-01 NOTE — ED Provider Notes (Signed)
[facility]    CSN: [id number] Arrival date & time: 09/01/22  1205      History   Chief Complaint Chief Complaint  Patient presents with  . Cough  . Wheezing    HPI Heidi [name] is a 72 y.o. female.   Patient with past medical history of chronic diastolic CHF, paroxysmal A-fib (on Eliquis), COPD, asthma, type 2 diabetes mellitus, oxygen dependence (2 L nasal cannula), and hypertension presents to urgent care for evaluation of cough, nasal congestion, shortness of breath, and green sputum for the last 4 days.  Patient has been using her albuterol nebulizer machine at home without relief of cough, shortness of breath, and wheeze.  No new orthopnea or weight changes. States her leg swelling is minimal. She has been taking all of her home medications as prescribed including lasix '40mg'$  QD. States she is prone to developing pneumonia and requesting chest x-ray. No fevers, chills, nausea, vomiting, diarrhea, abdominal pain, urinary symptoms, recent falls, vision changes, extremity weakness, sore throat, chest pain, or heart palpitations. She is currently short of breath but able to speak in full sentences without difficulty.  Last appointment with pulmonology was approximately 3 weeks ago and they did not make any changes to her medications.  Last appointment with cardiology was in November 2023 and they, again, did not make any changes to her medications.  Last left ventricular ejection fraction was 65 to 70% based on findings from November 2023.  She is not a smoker and denies drug use. She has been using mucinex, vicks vapo rub, and other OTC medicines for symptoms without relief. Continues to use 2L oxygen and has not needed to increase this.    Cough Associated symptoms: wheezing   Wheezing Associated symptoms: cough     Past Medical History:  Diagnosis Date  . Adenomatous colon polyp   . Anemia   . Anxiety   . Asthma   . CHF (congestive heart failure) ([place])   . COPD  (chronic obstructive pulmonary disease) ([place])   . Diabetes mellitus without complication ([place])   . High cholesterol   . Hypertension   . Left knee injury   . Ocular hypertension 08/12/2022   OD  . Pneumonia   . Sleep apnea   . Supplemental oxygen dependent    2L [place]    Patient Active Problem List   Diagnosis Date Noted  . Chronic diastolic CHF (congestive heart failure) ([place]) 03/18/2022  . PAF (paroxysmal atrial fibrillation) ([place])   . Unstable angina ([place])   . Chronic respiratory failure with hypoxia ([place]) 03/03/2022  . Moderate persistent asthma 03/03/2022  . Migraines 03/03/2022  . Moderate persistent asthma without complication [id number]  . History of colonic polyps   . Benign neoplasm of ascending colon   . Benign neoplasm of transverse colon   . Benign neoplasm of descending colon   . Postural dizziness with presyncope 02/10/2021  . Acute encephalopathy 01/27/2021  . Fall at home, initial encounter 01/27/2021  . Pneumonia due to COVID-19 virus 08/10/2020  . Encephalopathy due to COVID-19 virus 08/10/2020  . Acute kidney injury superimposed on CKD ([place]) 08/10/2020  . COVID 08/10/2020  . Encounter for medication review and counseling 03/18/2020  . Medication management 02/28/2020  . Exertional dyspnea 01/03/2020  . Breast nodule 11/24/2019  . Chronic respiratory failure with hypoxia, on home oxygen therapy ([place]) 11/13/2019  . Chest pain 08/24/2019  . Diabetic retinopathy of right eye associated with type 2 diabetes mellitus ([place]) 08/16/2018  .  Upper airway cough syndrome 05/03/2018  . Morbid obesity due to excess calories ([place]) complicated by hbp/ dm / hyperlipidemia 09/10/2017  . Cough variant asthma vs UACS/vcd on ACEi  09/09/2017  . Anxiety and depression 09/06/2017  . Mild persistent asthma without complication 0000000  . Paranoia ([place]) 09/06/2017  . Torn left ear lobe 11/20/2016  . Heart failure with preserved ejection fraction ([place]) 06/27/2016  .  Post-menopausal atrophic vaginitis 11/14/2015  . Primary osteoarthritis of both knees 07/18/2015  . GERD (gastroesophageal reflux disease) 05/08/2015  . Renal cyst 05/08/2015  . Adenomatous polyp of colon 03/26/2015  . Essential hypertension 02/12/2015  . Cognitive deficit due to old head trauma 02/12/2015  . Chronic left shoulder pain 02/11/2015  . Thyroid nodule 12/18/2014  . Chronic neck pain   . HLD (hyperlipidemia)   . OSA treated with BiPAP 11/27/2014  . Obesity hypoventilation syndrome ([place]) 11/27/2014  . Demand ischemia   . Uncontrolled type 2 diabetes mellitus with hyperglycemia ([place])   . Cardiomegaly 11/25/2014  . DM type 2 causing CKD stage 2 ([place]) 11/25/2014    Past Surgical History:  Procedure Laterality Date  . ABDOMINAL HYSTERECTOMY    . CATARACT EXTRACTION Left   . CATARACT EXTRACTION     LT 01/2017, 02/2017 on RT  . CESAREAN SECTION    . COLONOSCOPY WITH PROPOFOL N/A 04/15/2021   Procedure: COLONOSCOPY WITH PROPOFOL;  Surgeon: Irene Shipper, MD;  Location: [facility];  Service: Endoscopy;  Laterality: N/A;  patient is on O2  . JOINT REPLACEMENT Left    Plate in Ankle  . LEFT HEART CATH AND CORONARY ANGIOGRAPHY N/A 03/04/2022   Procedure: LEFT HEART CATH AND CORONARY ANGIOGRAPHY;  Surgeon: Nigel Mormon, MD;  Location: [facility];  Service: Cardiovascular;  Laterality: N/A;  . POLYPECTOMY  04/15/2021   Procedure: POLYPECTOMY;  Surgeon: Irene Shipper, MD;  Location: [facility];  Service: Endoscopy;;    OB History   No obstetric history on file.      Home Medications    Prior to Admission medications   Medication Sig Start Date End Date Taking? Authorizing Provider  Accu-Chek Softclix Lancets lancets Use as directed.  ICD E11.69 08/04/22   Ladell Pier, MD  acetaminophen (TYLENOL) 500 MG tablet Take 500-1,000 mg by mouth every 6 (six) hours as needed (pain).    [provider]  albuterol (PROVENTIL) (2.5 MG/3ML) 0.083%  nebulizer solution Take 3 mLs (2.5 mg total) by nebulization every 6 (six) hours as needed for wheezing or shortness of breath. 03/18/22   Cobb, Karie Schwalbe, NP  albuterol (VENTOLIN HFA) 108 (90 Base) MCG/ACT inhaler Inhale 1-2 puffs into the lungs every 4 (four) hours as needed for wheezing or shortness of breath. 10/21/20   Spero Geralds, MD  Alcohol Swabs 70 % PADS UAD. 08/04/22   Ladell Pier, MD  apixaban (ELIQUIS) 5 MG TABS tablet Take 1 tablet (5 mg total) by mouth 2 (two) times daily. 04/07/22   Patwardhan, Reynold Bowen, MD  atenolol (TENORMIN) 25 MG tablet TAKE 1 TABLET(25 MG) BY MOUTH DAILY 08/12/22   Ladell Pier, MD  atorvastatin (LIPITOR) 40 MG tablet Take 1 tablet (40 mg total) by mouth every evening. 08/12/22   Ladell Pier, MD  Blood Glucose Calibration (ACCU-CHEK GUIDE CONTROL) LIQD L1-L2 Control Solution. UAD 08/04/22   Ladell Pier, MD  Blood Glucose Monitoring Suppl (ACCU-CHEK GUIDE) w/Device KIT UAD 08/04/22   Ladell Pier, MD  botulinum  toxin Type A (BOTOX) 200 units injection Provider to inject 155 units into the muscles of the head and neck every 12 weeks. Discard remainder. 07/13/22   Melvenia Beam, MD  budesonide-formoterol (SYMBICORT) 80-4.5 MCG/ACT inhaler Inhale 2 puffs into the lungs in the morning and at bedtime. 03/18/22   Cobb, Karie Schwalbe, NP  diltiazem (CARDIZEM CD) 120 MG 24 hr capsule TAKE 1 CAPSULE(120 MG) BY MOUTH DAILY 08/12/22   Patwardhan, Manish J, MD  empagliflozin (JARDIANCE) 10 MG TABS tablet Take 1 tablet (10 mg total) by mouth daily. 07/29/22   Ladell Pier, MD  furosemide (LASIX) 40 MG tablet TAKE 1 TABLET BY MOUTH DAILY 08/12/22   Ladell Pier, MD  glucose blood (ACCU-CHEK GUIDE) test strip Test blood sugars 3-4 times a day with meals and at bedtime. E11.69 08/04/22   Ladell Pier, MD  insulin lispro protamine-lispro (HUMALOG MIX 75/25) (75-25) 100 UNIT/ML SUSP injection Inject 50 units in the morning and 40 units in the  evening. 03/07/22   Domenic Polite, MD  Insulin Syringe-Needle U-100 (INSULIN SYRINGE 1CC/31GX5/16") 31G X 5/16" 1 ML MISC Use to inject Humalog 75/25 mix twice daily. Dx: E11.69 07/08/22   Ladell Pier, MD  loratadine (CLARITIN) 10 MG tablet Take 1 tablet (10 mg total) by mouth daily as needed for allergies. 11/07/21   Ladell Pier, MD  losartan (COZAAR) 50 MG tablet TAKE 1 TABLET(50 MG) BY MOUTH DAILY 08/12/22   Ladell Pier, MD  montelukast (SINGULAIR) 10 MG tablet Take 1 tablet (10 mg total) by mouth at bedtime. 03/18/20   Tanda Rockers, MD  Multiple Vitamin (MULTIVITAMIN WITH MINERALS) TABS tablet Take 1 tablet by mouth in the morning. Centrum Silver    [provider]  nitroGLYCERIN (NITROSTAT) 0.3 MG SL tablet DISSOLVE 1 TABLET UNDER THE  TONGUE EVERY 5 MINUTES AS NEEDED FOR CHEST PAIN. MAX OF 3 TABLETS IN 15 MINUTES. CALL 911 IF PAIN  PERSISTS. Strength: 0.3 mg 04/30/22   Patwardhan, Reynold Bowen, MD  OXYGEN Inhale 2 L into the lungs continuous.    [provider]  pantoprazole (PROTONIX) 20 MG tablet Take 1 tablet (20 mg total) by mouth daily. 04/10/22   Ladell Pier, MD  Potassium Chloride ER 20 MEQ TBCR Take 1 tablet (20 mEq total) by mouth daily. 08/04/22   Ladell Pier, MD  Pseudoeph-Doxylamine-DM-APAP (NYQUIL PO) Take by mouth as needed.    [provider]  tetrahydrozoline-zinc (VISINE-AC) 0.05-0.25 % ophthalmic solution Place 1 drop into both eyes 3 (three) times daily as needed (dry / irritated eyes).    [provider]  tiZANidine (ZANAFLEX) 2 MG tablet Take 1 tablet (2 mg total) by mouth 2 (two) times daily as needed for muscle spasms. Each prescription to last 1 mth. 07/28/22   Ladell Pier, MD  topiramate (TOPAMAX) 25 MG tablet Take 1 tablet (25 mg) with Topiramate 50 mg tablet by mouth twice daily for total dose of 75 mg twice daily. 08/11/22   Melvenia Beam, MD  topiramate (TOPAMAX) 50 MG tablet Take 1 tablet (50 mg)  with 25 mg by mouth twice daily for total dose of 75 mg twice daily. 08/11/22   Melvenia Beam, MD  Ubrogepant (UBRELVY) 100 MG TABS Take 1 tablet onset migraine, may take another tablet if needed 2 hours later. (2 tab max/daily) 07/23/22   Melvenia Beam, MD    Family History Family History  Problem Relation Age of Onset  .  Colon cancer Father   . Diabetes type II Sister   . Diabetes type II Brother   . Colon cancer Sister   . Migraines Neg Hx   . Breast cancer Neg Hx     Social History Social History   Tobacco Use  . Smoking status: Never    Passive exposure: Never  . Smokeless tobacco: Never  Vaping Use  . Vaping Use: Never used  Substance Use Topics  . Alcohol use: No  . Drug use: No     Allergies   Januvia [sitagliptin] and Tramadol   Review of Systems Review of Systems  Respiratory:  Positive for cough and wheezing.   Per HPI   Physical Exam Triage Vital Signs ED Triage Vitals  Enc Vitals Group     BP 09/01/22 1309 123/66     Pulse Rate 09/01/22 1309 69     Resp 09/01/22 1309 18     Temp 09/01/22 1309 98.3 F (36.8 C)     Temp Source 09/01/22 1309 Oral     SpO2 09/01/22 1309 95 %     Weight --      Height --      Head Circumference --      Peak Flow --      Pain Score 09/01/22 1311 0     Pain Loc --      Pain Edu? --      Excl. in [place]? --    No data found.  Updated Vital Signs BP 123/66 (BP Location: Left Arm)   Pulse 69   Temp 98.3 F (36.8 C) (Oral)   Resp 18   SpO2 95%   Visual Acuity Right Eye Distance:   Left Eye Distance:   Bilateral Distance:    Right Eye Near:   Left Eye Near:    Bilateral Near:     Physical Exam Vitals and nursing note reviewed.  Constitutional:      Appearance: She is obese. She is not ill-appearing or toxic-appearing.  HENT:     Head: Normocephalic and atraumatic.     Right Ear: Hearing, tympanic membrane, ear canal and external ear normal.     Left Ear: Hearing, tympanic membrane, ear canal and  external ear normal.     Nose: Nose normal. No congestion.     Mouth/Throat:     Lips: Pink.     Mouth: Mucous membranes are moist. No injury.     Tongue: No lesions. Tongue does not deviate from midline.     Palate: No mass and lesions.     Pharynx: Oropharynx is clear. Uvula midline. No pharyngeal swelling, oropharyngeal exudate, posterior oropharyngeal erythema or uvula swelling.     Tonsils: No tonsillar exudate or tonsillar abscesses.  Eyes:     General: Lids are normal. Vision grossly intact. Gaze aligned appropriately.        Right eye: No discharge.        Left eye: No discharge.     Extraocular Movements: Extraocular movements intact.     Conjunctiva/sclera: Conjunctivae normal.  Cardiovascular:     Rate and Rhythm: Normal rate and regular rhythm.     Heart sounds: Normal heart sounds, S1 normal and S2 normal.  Pulmonary:     Effort: Pulmonary effort is normal. No respiratory distress.     Breath sounds: Normal air entry. Wheezing present. No rhonchi or rales.     Comments: Diffuse expiratory wheezing to all lung fields.  Chest:  Chest wall: No tenderness.  Abdominal:     General: Bowel sounds are normal.     Palpations: Abdomen is soft.     Tenderness: There is no abdominal tenderness. There is no right CVA tenderness, left CVA tenderness or guarding.  Musculoskeletal:     Cervical back: Neck supple.     Right lower leg: Edema (Trace) present.     Left lower leg: Edema (Trace) present.  Lymphadenopathy:     Cervical: No cervical adenopathy.  Skin:    General: Skin is warm and dry.     Capillary Refill: Capillary refill takes less than 2 seconds.     Findings: No rash.  Neurological:     General: No focal deficit present.     Mental Status: She is alert and oriented to person, place, and time. Mental status is at baseline.     Cranial Nerves: No dysarthria or facial asymmetry.     Motor: No weakness.     Gait: Gait normal.  Psychiatric:        Mood and Affect:  Mood normal.        Speech: Speech normal.        Behavior: Behavior normal.        Thought Content: Thought content normal.        Judgment: Judgment normal.     UC Treatments / Results  Labs (all labs ordered are listed, but only abnormal results are displayed) Labs Reviewed  SARS CORONAVIRUS 2 (TAT 6-24 HRS)    EKG   Radiology DG Chest 2 View  Result Date: 09/01/2022 CLINICAL DATA:  shortness of breath, cough EXAM: CHEST - 2 VIEW COMPARISON:  03/02/2022 FINDINGS: Cardiac silhouette is prominent. There is pulmonary interstitial prominence with vascular congestion. No focal consolidation. No pneumothorax or pleural effusion identified. There are thoracic degenerative changes. IMPRESSION: Findings suggest CHF. Electronically Signed   By: Sammie Bench M.D.   On: 09/01/2022 13:55    Procedures Procedures (including critical care time)  Medications Ordered in UC Medications  albuterol (PROVENTIL) (2.5 MG/3ML) 0.083% nebulizer solution 2.5 mg (has no administration in time range)  methylPREDNISolone sodium succinate (SOLU-MEDROL) 125 mg/2 mL injection 80 mg (80 mg Intramuscular Given 09/01/22 1358)  ipratropium-albuterol (DUONEB) 0.5-2.5 (3) MG/3ML nebulizer solution 3 mL (3 mLs Nebulization Given 09/01/22 1355)    Initial Impression / Assessment and Plan / UC Course  I have reviewed the triage vital signs and the nursing notes.  Pertinent labs & imaging results that were available during my care of the patient were reviewed by me and considered in my medical decision making (see chart for details).     *** Final Clinical Impressions(s) / UC Diagnoses   Final diagnoses:  None   Discharge Instructions   None    ED Prescriptions   None    PDMP not reviewed this encounter.

## 2022-09-02 ENCOUNTER — Other Ambulatory Visit: Payer: Self-pay | Admitting: Internal Medicine

## 2022-09-02 ENCOUNTER — Telehealth: Payer: Self-pay | Admitting: Licensed Clinical Social Worker

## 2022-09-02 DIAGNOSIS — M545 Low back pain, unspecified: Secondary | ICD-10-CM

## 2022-09-02 LAB — SARS CORONAVIRUS 2 (TAT 6-24 HRS): SARS Coronavirus 2: NEGATIVE

## 2022-09-02 NOTE — Telephone Encounter (Signed)
Pt daughter [name] called back, per pharmacy they do not have the prescription.  Reached out to [facility], she will call the pharmacy.

## 2022-09-02 NOTE — Patient Instructions (Signed)
Visit Information  Thank you for taking time to visit with me today. Please don't hesitate to contact me if I can be of assistance to you.   Following are the goals we discussed today:   Goals Addressed             This Visit's Progress    Provide Supportive Resources-Safe Housing       Activities and task to complete in order to accomplish goals.   Follow up with housing resources discussed ([place]) [place] through [facility] to inquire about safety of residence         [place] next appointment is by telephone on 09/09/22 at 9:30 AM  Please call the care guide team at [phone] if you need to cancel or reschedule your appointment.   If you are experiencing a Mental Health or [place] or need someone to talk to, please call the Suicide and Crisis Lifeline: 988 call 911   The patient verbalized understanding of instructions, educational materials, and care plan provided today and DECLINED offer to receive copy of patient instructions, educational materials, and care plan.   Christa See, MSW, [email or web address] Phone [phone] 10:49 AM

## 2022-09-02 NOTE — Patient Outreach (Signed)
  Care Coordination   Initial Visit Note   09/02/2022 Name: Heidi [name] MRN: [id number] DOB: 12/23/1950  Heidi [name] is a 72 y.o. year old female who sees Heidi Pier, MD for primary care. I spoke with  Heidi [name] by phone today.  What matters to the patients health and wellness today?   Conducted brief assessment, recommendations and relevant information discussed.  .     Goals Addressed             This Visit's Progress    Provide Supportive Resources-Safe Housing       Activities and task to complete in order to accomplish goals.   Follow up with housing resources discussed ([place]) [place] through [facility] to inquire about safety of residence         SDOH assessments and interventions completed:  Yes  SDOH Interventions Today    Flowsheet Row Most Recent Value  SDOH Interventions   Food Insecurity Interventions Intervention Not Indicated  Housing Interventions Other (Comment)  [[place] [employer]]  Transportation Interventions Intervention Not Indicated        Care Coordination Interventions:  Yes, provided  Interventions Today    Flowsheet Row Most Recent Value  Chronic Disease   Chronic disease during today's visit Hypertension (HTN), Congestive Heart Failure (CHF), Diabetes, Other  [Anxiety and Depression]  General Interventions   General Interventions Discussed/Reviewed General Interventions Discussed, Radio producer Interventions   Education Provided Provided Education  Provided Orthoptist On [place] Discussed/Reviewed Mental Health Discussed  Safety Interventions   Safety Discussed/Reviewed Ecologist for community resources  [facility] through [place]       Follow up plan: Follow up call scheduled for 1-2 weeks    Encounter Outcome:  Pt. Visit Completed   Heidi [name], MSW,  [email or web address] Phone [phone] 10:48 AM

## 2022-09-02 NOTE — Telephone Encounter (Signed)
Pt daughter is calling back stating that the pharmacy is saying the medication need to be approved for her to pick it up. Pt daughter is wanting to know if someone can call the pharmacy and let them know it is ok for her to pick up pt medication.   [facility] [id number], [place] Phone: [phone]  Fax: [phone]

## 2022-09-02 NOTE — Patient Outreach (Deleted)
  Care Coordination   09/02/2022 Name: Heidi [name] MRN: [id number] DOB: 02-Jan-1951   Care Coordination Outreach Attempts:  An unsuccessful telephone outreach was attempted today to offer the patient information about available care coordination services as a benefit of their health plan.   Follow Up Plan:  Additional outreach attempts will be made to offer the patient care coordination information and services.   Encounter Outcome:  No Answer   Care Coordination Interventions:  No, not indicated    Christa See, MSW, [email or web address] Phone [phone] 10:12 AM

## 2022-09-03 NOTE — Telephone Encounter (Signed)
   Notes to clinic: Duplicate request- see below- non delegated Rx  Requested Prescriptions  Pending Prescriptions Disp Refills   tiZANidine (ZANAFLEX) 2 MG tablet [Pharmacy Med Name: TIZANIDINE 2MG TABLETS] 90 tablet     Sig: TAKE 1 TABLET(2 MG) BY MOUTH DAILY AS NEEDED FOR MUSCLE SPASMS     Not Delegated - Cardiovascular:  Alpha-2 Agonists - tizanidine Failed - 09/03/2022  8:06 AM      Failed - This refill cannot be delegated      Passed - Valid encounter within last 6 months    Recent Outpatient Visits           4 months ago Hospital discharge follow-up   [place], MD   10 months ago Type 2 diabetes mellitus with morbid obesity [facility])   [place] Ladell Pier, MD   11 months ago No-show for appointment   [facility] Karle Plumber B, MD   12 months ago Type 2 diabetes mellitus with morbid obesity [facility])   [place] Karle Plumber B, MD   1 year ago Uncontrolled type 2 diabetes mellitus with hyperglycemia [facility])   [place], Vernia Buff, NP       Future Appointments             In 2 weeks Ladell Pier, MD [place]   In 2 months Patwardhan, Reynold Bowen, MD [facility]               Requested Prescriptions  Pending Prescriptions Disp Refills   tiZANidine (ZANAFLEX) 2 MG tablet [Pharmacy Med Name: TIZANIDINE 2MG TABLETS] 90 tablet     Sig: TAKE 1 TABLET(2 MG) BY MOUTH DAILY AS NEEDED FOR MUSCLE SPASMS     Not Delegated - Cardiovascular:  Alpha-2 Agonists - tizanidine Failed - 09/03/2022  8:06 AM      Failed - This refill cannot be delegated      Passed - Valid encounter within last 6 months    Recent Outpatient Visits           4 months ago Hospital discharge follow-up   [place], MD   10 months ago Type 2 diabetes mellitus with morbid obesity [facility])   [place] Ladell Pier, MD   11 months ago No-show for appointment   [facility] Karle Plumber B, MD   12 months ago Type 2 diabetes mellitus with morbid obesity [facility])   [place] Karle Plumber B, MD   1 year ago Uncontrolled type 2 diabetes mellitus with hyperglycemia [facility])   [place], Vernia Buff, NP       Future Appointments             In 2 weeks Ladell Pier, MD [place]   In 2 months Patwardhan, Reynold Bowen, MD [facility]

## 2022-09-03 NOTE — Telephone Encounter (Signed)
Pt called about her Tizanidine but did not specify what she needed / she asked for Cassandra to give her a call and stated that Vito Backers knows what she needs / please advise

## 2022-09-03 NOTE — Telephone Encounter (Signed)
Requested medication (s) are due for refill today - no  Requested medication (s) are on the active medication list -yes  Future visit scheduled -yes  Last refill: 07/28/22 #40 1RF  Notes to clinic: non delegated Rx  Requested Prescriptions  Pending Prescriptions Disp Refills   tiZANidine (ZANAFLEX) 2 MG tablet [Pharmacy Med Name: TIZANIDINE '2MG'$  TABLETS] 90 tablet     Sig: TAKE 1 TABLET(2 MG) BY MOUTH DAILY AS NEEDED FOR MUSCLE SPASMS     Not Delegated - Cardiovascular:  Alpha-2 Agonists - tizanidine Failed - 09/03/2022  8:06 AM      Failed - This refill cannot be delegated      Passed - Valid encounter within last 6 months    Recent Outpatient Visits           4 months ago Hospital discharge follow-up   [place], MD   10 months ago Type 2 diabetes mellitus with morbid obesity [facility])   [place] Ladell Pier, MD   11 months ago No-show for appointment   [facility] Karle Plumber B, MD   12 months ago Type 2 diabetes mellitus with morbid obesity [facility])   [place] Karle Plumber B, MD   1 year ago Uncontrolled type 2 diabetes mellitus with hyperglycemia [facility])   [place], Vernia Buff, NP       Future Appointments             In 2 weeks Ladell Pier, MD [place]   In 2 months Patwardhan, Reynold Bowen, MD [facility]               Requested Prescriptions  Pending Prescriptions Disp Refills   tiZANidine (ZANAFLEX) 2 MG tablet [Pharmacy Med Name: TIZANIDINE '2MG'$  TABLETS] 90 tablet     Sig: TAKE 1 TABLET(2 MG) BY MOUTH DAILY AS NEEDED FOR MUSCLE SPASMS     Not Delegated - Cardiovascular:  Alpha-2 Agonists - tizanidine Failed - 09/03/2022  8:06 AM      Failed - This refill cannot be delegated       Passed - Valid encounter within last 6 months    Recent Outpatient Visits           4 months ago Hospital discharge follow-up   [place], MD   10 months ago Type 2 diabetes mellitus with morbid obesity [facility])   [place] Ladell Pier, MD   11 months ago No-show for appointment   [facility] Karle Plumber B, MD   12 months ago Type 2 diabetes mellitus with morbid obesity [facility])   [place] Karle Plumber B, MD   1 year ago Uncontrolled type 2 diabetes mellitus with hyperglycemia [facility])   [place], Vernia Buff, NP       Future Appointments             In 2 weeks Ladell Pier, MD [place]   In 2 months Patwardhan, Reynold Bowen, MD [facility]

## 2022-09-03 NOTE — Telephone Encounter (Signed)
Patient called to get an update on refill request. Please follow up with patient.

## 2022-09-04 ENCOUNTER — Other Ambulatory Visit: Payer: Self-pay

## 2022-09-04 ENCOUNTER — Telehealth: Payer: Self-pay | Admitting: Emergency Medicine

## 2022-09-04 ENCOUNTER — Other Ambulatory Visit: Payer: Self-pay | Admitting: Cardiology

## 2022-09-04 ENCOUNTER — Ambulatory Visit: Payer: Self-pay

## 2022-09-04 ENCOUNTER — Telehealth: Payer: Self-pay | Admitting: Pharmacist

## 2022-09-04 MED ORDER — ELIQUIS 5 MG PO TABS
5.0000 mg | ORAL_TABLET | Freq: Two times a day (BID) | ORAL | 3 refills | Status: DC
  Filled 2022-09-04: qty 30, 15d supply, fill #0
  Filled 2022-09-21: qty 30, 15d supply, fill #1
  Filled 2022-10-07: qty 60, 30d supply, fill #2

## 2022-09-04 NOTE — Telephone Encounter (Signed)
Copied from [place] [id number]. Topic: General - Other >> Sep 04, 2022  4:00 PM Dominique A wrote: Reason for CRM: [name] with [place] [facility] states that pt is having trouble affording her medication and is wondering if the office has free samples that the pt can come and get.  apixaban (ELIQUIS) 5 MG TABS tablet

## 2022-09-04 NOTE — Progress Notes (Signed)
[facility])                                            [place] Team    09/04/2022  Venola Felling 08-Jun-1951 [id number]  Received telephone call from [place] regarding the cost of Eliquis and patient's inability to purchase the medication. Patient co-pay cost for the Eliquis has increased.   Placed call to [place] plan, patient is currently in the Medicare Coverage gap and cost of medications will be increased until patient is able to exit the coverage gap.   Placed telephonic outreach to [place] [facility] to inquire about samples of Eliquis for patient.   Placed call to [place] to inquire if a hardship is available for patient, unfortunately she will be required to spend the full $251.13 out of pocket prior to approval for Eliquis through PAP.   Placed telephonic outreach to patient, attempted to explain the Medicare Coverage Gap to patient. Patient's daughter had already picked up the 15 day supply of Eliquis from the pharmacy today. Patient informed me  that there was a $0 charge for the Eliquis she received today. I did discuss possible patient assistance for the Ubrelvy, patient informed me that she was not feeling well and requested that I follow up with her at another time. Will attempt to follow up with patient next week.   Loretha Brasil, PharmD [place] Clinical Pharmacist Direct Dial: [phone]

## 2022-09-04 NOTE — Telephone Encounter (Signed)
Heidi [name], [facility] from [facility] called, states he has concerns regarding medication be misused or abused. Pt refilled #120 on 07/06/22 and picked up on 07/06/22 then requested refill for #40 on 07/28/22 picked up 08/05/22 and #40 on 08/09/22, refill request last week and was denied from Dr. Wynetta Emery. He states [name] daughter called in refill for her medication '6mg'$  as well d/t "accidentally spilt down garbage disposal" for early refill so [name] just has concerns at this point. He asked if ok to void the refill she has on file for #120 and advised him yes that pt isn't able to get refills unable having OV on 09/21/22 per previous notes from provider. He also states that pt and [facility] but [facility] have [facility] of [facility] as well. Advised him I would route back to provider for FYI. No further assistance was needed.   Summary: Pharmacy seeking clinical advice.   Heidi [name] from [employer] is requesting a callback from a nurse stated has concerns on refill history for pt on medication tiZANidine (ZANAFLEX) 2 MG tablet .  Pharmacy seeking clinical advice.         Reason for Disposition . [1] Pharmacy calling with prescription question AND [2] triager unable to answer question  Answer Assessment - Initial Assessment Questions 1. NAME of MEDICINE: "What medicine(s) are you calling about?"     Tizanidine  2. QUESTION: "What is your question?" (e.g., double dose of medicine, side effect)     See triage notes as FYI 3. PRESCRIBER: "Who prescribed the medicine?" Reason: if prescribed by specialist, call should be referred to that group.     Dr. Wynetta Emery  Protocols used: Medication Question Call-A-AH

## 2022-09-04 NOTE — Telephone Encounter (Signed)
FYI

## 2022-09-05 NOTE — Telephone Encounter (Signed)
Phone call returned to [facility] last evening.  The pharmacist [name] who had called had already left for today.  I was able to speak with another pharmacist named Judson Roch about the patient.  She tells me that patient filled prescription for tizanidine on 08/05/2022 and again on 08/09/2022 each for 40 tablets.  I inquired why she was allowed to refill so soon as instructions on the prescription was that each 1 was the last 1 month at a time.  She tells me that it was missed because patient used a coupon card rather than going through her insurance.  Currently no further refills remaining. I called the patient and expressed concern about overuse of tizanidine.  Patient tells me that she is not abusing the medication.  Reports that when she had called for early refill she had misplaced her bottle of 120 tabs.  Thinks bottle slipped off her night stand into trash can that was tossed out.  Reports med is somewhat helpful for pains in her body.  I advised pt that no early refills will be given and based on her last 2 Rfs, she will not be due again until beginning of April.  She expressed understanding.

## 2022-09-08 ENCOUNTER — Telehealth: Payer: Self-pay | Admitting: Licensed Clinical Social Worker

## 2022-09-08 NOTE — Telephone Encounter (Signed)
Called & spoke to the patient. Verified name & DOB. Informed that there are no available Eliquis samples and to reach out to [facility]) if further assistance is needed with coverage for this mediation. Patient expressed verbal understanding and stated that she was able to get some Eliquis. No further questions at this time.

## 2022-09-09 ENCOUNTER — Other Ambulatory Visit: Payer: Self-pay

## 2022-09-09 ENCOUNTER — Ambulatory Visit: Payer: Self-pay | Admitting: Licensed Clinical Social Worker

## 2022-09-09 NOTE — Patient Outreach (Addendum)
  Care Coordination   Follow Up Visit Note   09/09/2022 Name: Heidi [name] MRN: [id number] DOB: [birth year]  Heidi [name] is a 72 y.o. year old female who sees Ladell Pier, MD for primary care. I spoke with  [name] by phone today.  What matters to the patients health and wellness today?  Housing    Goals Addressed             This Visit's Progress    Provide Supportive Resources-Safe Housing   On track    Activities and task to complete in order to accomplish goals.   Follow up with housing list provided by [employer] via [employer] preferred apartments to inquire about units [employer] through [facility] to inquire about safety of residence          SDOH assessments and interventions completed:  No     Care Coordination Interventions:  Yes, provided  Interventions Today    Flowsheet Row Most Recent Value  Chronic Disease   Chronic disease during today's visit Hypertension (HTN), Congestive Heart Failure (CHF), Chronic Kidney Disease/End Stage Renal Disease (ESRD), Diabetes  General Interventions   General Interventions Discussed/Reviewed General Interventions Reviewed, [employer] upcoming appts. Has transportation through daughter, agreed to contact LCSW, should anything change]  Mental Health Interventions   Mental Health Discussed/Reviewed Mental Health Reviewed       Follow up plan: Follow up call scheduled for 2-4 weeks    Encounter Outcome:  Pt. Visit Completed   Christa See, MSW, [email or web address] Phone [phone] 10:48 AM

## 2022-09-09 NOTE — Patient Instructions (Signed)
Visit Information  Thank you for taking time to visit with me today. Please don't hesitate to contact me if I can be of assistance to you.   Following are the goals we discussed today:   Goals Addressed             This Visit's Progress    Provide Supportive Resources-Safe Housing   On track    Activities and task to complete in order to accomplish goals.   Follow up with housing resources discussed ([place]) [facility] through [facility] to inquire about safety of residence         [place] next appointment is by telephone on 09/09/22   Please call the care guide team at [phone] if you need to cancel or reschedule your appointment.   If you are experiencing a Mental Health or [place] or need someone to talk to, please call the Suicide and Crisis Lifeline: 988 call 911   The patient verbalized understanding of instructions, educational materials, and care plan provided today and DECLINED offer to receive copy of patient instructions, educational materials, and care plan.   Christa See, MSW, [email or web address] Phone [phone] 8:14 AM

## 2022-09-09 NOTE — Patient Instructions (Signed)
Visit Information  Thank you for taking time to visit with me today. Please don't hesitate to contact me if I can be of assistance to you.   Following are the goals we discussed today:   Goals Addressed             This Visit's Progress    Provide Supportive Resources-Safe Housing   On track    Activities and task to complete in order to accomplish goals.   Follow up with housing list provided by [employer] via [employer] preferred apartments to inquire about [employer] through [facility] to inquire about safety of residence          [place] next appointment is by telephone on 09/28/22 at 9:30 AM  Please call the care guide team at [phone] if you need to cancel or reschedule your appointment.   If you are experiencing a Mental Health or [place] or need someone to talk to, please call the Suicide and Crisis Lifeline: 988 call 911   The patient verbalized understanding of instructions, educational materials, and care plan provided today and DECLINED offer to receive copy of patient instructions, educational materials, and care plan.   Christa See, MSW, [email or web address] Phone [phone] 10:49 AM

## 2022-09-09 NOTE — Patient Outreach (Signed)
  Care Coordination   Follow Up Visit Note   09/08/22 Name: Heidi [name] MRN: [id number] DOB: 06-12-1951  Heidi [name] is a 72 y.o. year old female who sees Ladell Pier, MD for primary care. I spoke with  [name] by phone today.  What matters to the patients health and wellness today?  Housing    Goals Addressed             This Visit's Progress    Provide Supportive Resources-Safe Housing   On track    Activities and task to complete in order to accomplish goals.   Follow up with housing resources discussed ([place]) [place] through [facility] to inquire about safety of residence         SDOH assessments and interventions completed:  No     Care Coordination Interventions:  Yes, provided  Interventions Today    Flowsheet Row Most Recent Value  Chronic Disease   Chronic disease during today's visit Hypertension (HTN), Congestive Heart Failure (CHF), Diabetes, Chronic Kidney Disease/End Stage Renal Disease (ESRD)  General Interventions   General Interventions Discussed/Reviewed General Interventions Reviewed  Mental Health Interventions   Mental Health Discussed/Reviewed Anxiety, Depression, Mental Health Reviewed       Follow up plan: Follow up call scheduled for 09/09/22    Encounter Outcome:  Pt. Visit Completed   Christa See, MSW, [email or web address] Phone [phone] 8:14 AM

## 2022-09-11 ENCOUNTER — Telehealth: Payer: Self-pay | Admitting: Pharmacist

## 2022-09-11 NOTE — Telephone Encounter (Signed)
Spoke with pharmacy pt pick up med

## 2022-09-11 NOTE — Progress Notes (Signed)
[facility])                                            [place] Team    09/11/2022  Heidi [name] 10/23/1950 [id number]  Telephonic outreach made to [name] to follow up on costs with Ubrelvy and Eliquis. Patient is in the Medicare Coverage Gap at this time. Permission received from patient to proceed with patient assistance application for Ubrelvy.  [place] will follow up with patient as updates come up on the status of patient assistance cases.   Loretha Brasil, PharmD [place] Clinical Pharmacist Direct Dial: [phone]

## 2022-09-16 ENCOUNTER — Telehealth: Payer: Self-pay | Admitting: Pharmacy Technician

## 2022-09-16 DIAGNOSIS — Z5986 Financial insecurity: Secondary | ICD-10-CM

## 2022-09-16 DIAGNOSIS — Z596 Low income: Secondary | ICD-10-CM

## 2022-09-16 NOTE — Progress Notes (Signed)
[facility])                                            [place] Team    09/16/2022  Noellie Handrahan Mar 19, 1951 [id number]                                      Medication Assistance Referral  Referral From: [place]  Medication/Company: Roselyn Meier / [employer] Patient application portion:  N/A patient signed in office Provider application portion: Faxed  to Dr. Sarina Ill Provider address/fax verified via: Office website    Quanna Wittke P. Nadia Torr, [place]  [phone]

## 2022-09-17 NOTE — Telephone Encounter (Signed)
Received fax from [facility]. They are working on pt application for [employer] assistance. Prescription portion completed, then signed by Dr Jaynee Eagles. Form faxed back to [facility] along with documentation of insurance approval for Heidi [name] which the PAP may need. Received a receipt of confirmation.

## 2022-09-21 ENCOUNTER — Other Ambulatory Visit: Payer: Self-pay

## 2022-09-21 ENCOUNTER — Ambulatory Visit: Payer: Medicare HMO | Attending: Internal Medicine | Admitting: Internal Medicine

## 2022-09-21 ENCOUNTER — Encounter: Payer: Self-pay | Admitting: Internal Medicine

## 2022-09-21 DIAGNOSIS — N1831 Chronic kidney disease, stage 3a: Secondary | ICD-10-CM

## 2022-09-21 DIAGNOSIS — E041 Nontoxic single thyroid nodule: Secondary | ICD-10-CM | POA: Diagnosis not present

## 2022-09-21 DIAGNOSIS — J9611 Chronic respiratory failure with hypoxia: Secondary | ICD-10-CM

## 2022-09-21 DIAGNOSIS — Z1231 Encounter for screening mammogram for malignant neoplasm of breast: Secondary | ICD-10-CM

## 2022-09-21 DIAGNOSIS — E1169 Type 2 diabetes mellitus with other specified complication: Secondary | ICD-10-CM

## 2022-09-21 DIAGNOSIS — Z23 Encounter for immunization: Secondary | ICD-10-CM

## 2022-09-21 DIAGNOSIS — J4550 Severe persistent asthma, uncomplicated: Secondary | ICD-10-CM

## 2022-09-21 DIAGNOSIS — I480 Paroxysmal atrial fibrillation: Secondary | ICD-10-CM | POA: Diagnosis not present

## 2022-09-21 DIAGNOSIS — I5032 Chronic diastolic (congestive) heart failure: Secondary | ICD-10-CM

## 2022-09-21 DIAGNOSIS — Z9981 Dependence on supplemental oxygen: Secondary | ICD-10-CM

## 2022-09-21 DIAGNOSIS — N1832 Chronic kidney disease, stage 3b: Secondary | ICD-10-CM | POA: Insufficient documentation

## 2022-09-21 LAB — POCT GLYCOSYLATED HEMOGLOBIN (HGB A1C): HbA1c, POC (controlled diabetic range): 8.6 % — AB (ref 0.0–7.0)

## 2022-09-21 LAB — GLUCOSE, POCT (MANUAL RESULT ENTRY): POC Glucose: 137 mg/dl — AB (ref 70–99)

## 2022-09-21 MED ORDER — ZOSTER VAC RECOMB ADJUVANTED 50 MCG/0.5ML IM SUSR: 0.5000 mL | Freq: Once | INTRAMUSCULAR | 0 refills | Status: AC

## 2022-09-21 NOTE — Progress Notes (Signed)
Patient ID: Heidi [name], female    DOB: 10-16-1950  MRN: [id number]  CC: Follow-up (DM f/u. Med refill.s /Discuss tizanidine. Neoma Laming received flu vax. Yes to shingles vax. Yes to mammogram.)   Subjective: Heidi [name] is a 72 y.o. female who presents for chronic ds management Her concerns today include:  history of HTN, DM 2 with retinopathy, HL, CKD 3, diastolic CHF, a. Fib (02/2022) hypoxic respiratory failure/cough variant asthma vs moderate persistent asthma 2 L O2 continuously, migraines [facility] on Botox inj), OSA (declines CPAP or repeat sleep study), UACS, GERD, OA knees,  DDD/spondylosis of cervical spine,obesity, renal cyst, LT thyroid nodule.   DM: Results for orders placed or performed in visit on 09/21/22  POCT glucose (manual entry)  Result Value Ref Range   POC Glucose 137 (A) 70 - 99 mg/dl  POCT glycosylated hemoglobin (Hb A1C)  Result Value Ref Range   Hemoglobin A1C     HbA1c POC (<> result, manual entry)     HbA1c, POC (prediabetic range)     HbA1c, POC (controlled diabetic range) 8.6 (A) 0.0 - 7.0 %   *Note: Due to a large number of results and/or encounters for the requested time period, some results have not been displayed. A complete set of results can be found in Results Review.  Down A1C by more than 1 point Compliant with Jardiance and Humalog 75/25; reports taking 50 a.m/40 p.m Checks BS 3-4x/day.  Forgot her log today; she was rushing to get here after dealing with the name emergency with her daughter..  Does not want CGM.  Reports a few low BS episodes since last visit  HTN/diastolic CHF/A.fib:  compliant with meds and limits salt Medications are atenolol 25 mg daily, Eliquis 5 mg twice a day, Cardizem 120 mg daily, furosemide 40 mg daily, Cozaar 50 mg daily, potassium 20 mEq daily and Jardiance 10 mg daily Wgh down 13 lbs Little swelling today in feet.  Sleeps on 1 pillow, no PND or orthopnea.  No chest pains No bleeding or  bruising  Asthma: seen in UC end of last mth with cough.  Dx with CHF and COPD exacerbation Using Symbicort BID as prescribed Using Albuterol BID; uses mneb when she has flare Using her O2 continuously 2L.  Has Pox device at home.  O2 level around 95 with ambulation; lower at rest.  CKD 3: Most recent GFR's have been in the upper 40s.  She wanted to speak with me about the tizanidine.  She states that her daughter had called in 1 time requesting an increased dose for her.  States that she did not give her daughter permission to do so.  Reports that in the future if her daughter calls about changing doses of her medicines, we should call her directly instead.   C/o  Pain across top of back and all over body.    Was on Lortab in [place].  Felt it worked well.  Wonders about when she would be due for refill on tizanidine again.  Found it helpful.  Request to have thyroid ultrasound again for recheck of thyroid nodule that was seen in the left thyroid gland several years ago.  It was deemed to be stable in size.  No problems swallowing.  She does not have any nodules that she herself can palpate. Patient Active Problem List   Diagnosis Date Noted   Chronic diastolic CHF (congestive heart failure) ([place]) 03/18/2022   PAF (paroxysmal atrial fibrillation) ([place])  Unstable angina (HCC)    Chronic respiratory failure with hypoxia (HCC) 03/03/2022   Moderate persistent asthma 03/03/2022   Migraines 03/03/2022   Moderate persistent asthma without complication [id number]   History of colonic polyps    Benign neoplasm of ascending colon    Benign neoplasm of transverse colon    Benign neoplasm of descending colon    Postural dizziness with presyncope 02/10/2021   Acute encephalopathy 01/27/2021   Fall at home, initial encounter 01/27/2021   Pneumonia due to COVID-19 virus 08/10/2020   Encephalopathy due to COVID-19 virus 08/10/2020   Acute kidney injury superimposed on CKD ([place]) 08/10/2020    COVID 08/10/2020   Encounter for medication review and counseling 03/18/2020   Medication management 02/28/2020   Exertional dyspnea 01/03/2020   Breast nodule 11/24/2019   Chronic respiratory failure with hypoxia, on home oxygen therapy ([place]) 11/13/2019   Chest pain 08/24/2019   Diabetic retinopathy of right eye associated with type 2 diabetes mellitus ([place]) 08/16/2018   Upper airway cough syndrome 05/03/2018   Morbid obesity due to excess calories ([place]) complicated by hbp/ dm / hyperlipidemia 09/10/2017   Cough variant asthma vs UACS/vcd on ACEi  09/09/2017   Anxiety and depression 09/06/2017   Mild persistent asthma without complication 0000000   Paranoia ([place]) 09/06/2017   Torn left ear lobe 11/20/2016   Heart failure with preserved ejection fraction ([place]) 06/27/2016   Post-menopausal atrophic vaginitis 11/14/2015   Primary osteoarthritis of both knees 07/18/2015   GERD (gastroesophageal reflux disease) 05/08/2015   Renal cyst 05/08/2015   Adenomatous polyp of colon 03/26/2015   Essential hypertension 02/12/2015   Cognitive deficit due to old head trauma 02/12/2015   Chronic left shoulder pain 02/11/2015   Thyroid nodule 12/18/2014   Chronic neck pain    HLD (hyperlipidemia)    OSA treated with BiPAP 11/27/2014   Obesity hypoventilation syndrome ([place]) 11/27/2014   Demand ischemia    Uncontrolled type 2 diabetes mellitus with hyperglycemia ([place])    Cardiomegaly 11/25/2014   DM type 2 causing CKD stage 2 ([place]) 11/25/2014     Current Outpatient Medications on File Prior to Visit  Medication Sig Dispense Refill   Accu-Chek Softclix Lancets lancets Use as directed.  ICD E11.69 100 each 12   acetaminophen (TYLENOL) 500 MG tablet Take 500-1,000 mg by mouth every 6 (six) hours as needed (pain).     albuterol (PROVENTIL) (2.5 MG/3ML) 0.083% nebulizer solution Take 3 mLs (2.5 mg total) by nebulization every 6 (six) hours as needed for wheezing or shortness of breath. 75 mL 5    albuterol (VENTOLIN HFA) 108 (90 Base) MCG/ACT inhaler Inhale 1-2 puffs into the lungs every 4 (four) hours as needed for wheezing or shortness of breath. 8 g 5   Alcohol Swabs 70 % PADS UAD. 100 each 3   apixaban (ELIQUIS) 5 MG TABS tablet Take 1 tablet (5 mg total) by mouth 2 (two) times daily. 60 tablet 3   atenolol (TENORMIN) 25 MG tablet TAKE 1 TABLET(25 MG) BY MOUTH DAILY 90 tablet 2   atorvastatin (LIPITOR) 40 MG tablet Take 1 tablet (40 mg total) by mouth every evening. 90 tablet 2   benzonatate (TESSALON) 100 MG capsule Take 1 capsule (100 mg total) by mouth every 8 (eight) hours. 21 capsule 0   Blood Glucose Calibration (ACCU-CHEK GUIDE CONTROL) LIQD L1-L2 Control Solution. UAD 1 each 1   Blood Glucose Monitoring Suppl (ACCU-CHEK GUIDE) w/Device KIT UAD 1 kit 0   botulinum toxin  Type A (BOTOX) 200 units injection Provider to inject 155 units into the muscles of the head and neck every 12 weeks. Discard remainder. 1 each 3   budesonide-formoterol (SYMBICORT) 80-4.5 MCG/ACT inhaler Inhale 2 puffs into the lungs in the morning and at bedtime. 1 each 5   diltiazem (CARDIZEM CD) 120 MG 24 hr capsule TAKE 1 CAPSULE(120 MG) BY MOUTH DAILY 90 capsule 3   empagliflozin (JARDIANCE) 10 MG TABS tablet Take 1 tablet (10 mg total) by mouth daily. 30 tablet 3   furosemide (LASIX) 40 MG tablet TAKE 1 TABLET BY MOUTH DAILY 90 tablet 2   glucose blood (ACCU-CHEK GUIDE) test strip Test blood sugars 3-4 times a day with meals and at bedtime. E11.69 100 each 12   insulin lispro protamine-lispro (HUMALOG MIX 75/25) (75-25) 100 UNIT/ML SUSP injection Inject 50 units in the morning and 40 units in the evening.     Insulin Syringe-Needle U-100 (INSULIN SYRINGE 1CC/31GX5/16") 31G X 5/16" 1 ML MISC Use to inject Humalog 75/25 mix twice daily. Dx: E11.69 200 each 2   loratadine (CLARITIN) 10 MG tablet Take 1 tablet (10 mg total) by mouth daily as needed for allergies. 90 tablet 0   losartan (COZAAR) 50 MG tablet TAKE  1 TABLET(50 MG) BY MOUTH DAILY 90 tablet 1   montelukast (SINGULAIR) 10 MG tablet Take 1 tablet (10 mg total) by mouth at bedtime. 90 tablet 2   Multiple Vitamin (MULTIVITAMIN WITH MINERALS) TABS tablet Take 1 tablet by mouth in the morning. Centrum Silver     nitroGLYCERIN (NITROSTAT) 0.3 MG SL tablet DISSOLVE 1 TABLET UNDER THE  TONGUE EVERY 5 MINUTES AS NEEDED FOR CHEST PAIN. MAX OF 3 TABLETS IN 15 MINUTES. CALL 911 IF PAIN  PERSISTS. Strength: 0.3 mg 200 tablet 2   OXYGEN Inhale 2 L into the lungs continuous.     pantoprazole (PROTONIX) 20 MG tablet Take 1 tablet (20 mg total) by mouth daily. 30 tablet 6   Potassium Chloride ER 20 MEQ TBCR Take 1 tablet (20 mEq total) by mouth daily. 90 tablet 1   Pseudoeph-Doxylamine-DM-APAP (NYQUIL PO) Take by mouth as needed.     tetrahydrozoline-zinc (VISINE-AC) 0.05-0.25 % ophthalmic solution Place 1 drop into both eyes 3 (three) times daily as needed (dry / irritated eyes).     tiZANidine (ZANAFLEX) 2 MG tablet Take 1 tablet (2 mg total) by mouth 2 (two) times daily as needed for muscle spasms. Each prescription to last 1 mth. 40 tablet 1   topiramate (TOPAMAX) 25 MG tablet Take 1 tablet (25 mg) with Topiramate 50 mg tablet by mouth twice daily for total dose of 75 mg twice daily. 180 tablet 1   topiramate (TOPAMAX) 50 MG tablet Take 1 tablet (50 mg) with 25 mg by mouth twice daily for total dose of 75 mg twice daily. 180 tablet 1   Ubrogepant (UBRELVY) 100 MG TABS Take 1 tablet onset migraine, may take another tablet if needed 2 hours later. (2 tab max/daily) 16 tablet 5   No current facility-administered medications on file prior to visit.    Allergies  Allergen Reactions   Januvia [Sitagliptin]     Chest pains   Tramadol Other (See Comments)    hallucinations    Social History   Socioeconomic History   Marital status: Widowed    Spouse name: Not on file   Number of children: 4   Years of education: Not on file   Highest education level:  Not on  file  Occupational History   Occupation: Disabled  Tobacco Use   Smoking status: Never    Passive exposure: Never   Smokeless tobacco: Never  Vaping Use   Vaping Use: Never used  Substance and Sexual Activity   Alcohol use: No   Drug use: No   Sexual activity: Yes  Other Topics Concern   Not on file  Social History Narrative   Lives with daughter   Caffeine use: 1 cup coffee per day   Social Determinants of Health   Financial Resource Strain: Not on file  Food Insecurity: No Food Insecurity (09/02/2022)   Hunger Vital Sign    Worried About Running Out of Food in the Last Year: Never true    Ran Out of Food in the Last Year: Never true  Transportation Needs: No Transportation Needs (09/02/2022)   PRAPARE - Hydrologist (Medical): No    Lack of Transportation (Non-Medical): No  Physical Activity: Not on file  Stress: Not on file  Social Connections: Not on file  Intimate Partner Violence: Not on file    Family History  Problem Relation Age of Onset   Colon cancer Father    Diabetes type II Sister    Diabetes type II Brother    Colon cancer Sister    Migraines Neg Hx    Breast cancer Neg Hx     Past Surgical History:  Procedure Laterality Date   ABDOMINAL HYSTERECTOMY     CATARACT EXTRACTION Left    CATARACT EXTRACTION     LT 01/2017, 02/2017 on RT   CESAREAN SECTION     COLONOSCOPY WITH PROPOFOL N/A 04/15/2021   Procedure: COLONOSCOPY WITH PROPOFOL;  Surgeon: Irene Shipper, MD;  Location: [facility];  Service: Endoscopy;  Laterality: N/A;  patient is on O2   JOINT REPLACEMENT Left    Plate in Ankle   LEFT HEART CATH AND CORONARY ANGIOGRAPHY N/A 03/04/2022   Procedure: LEFT HEART CATH AND CORONARY ANGIOGRAPHY;  Surgeon: Nigel Mormon, MD;  Location: [facility];  Service: Cardiovascular;  Laterality: N/A;   POLYPECTOMY  04/15/2021   Procedure: POLYPECTOMY;  Surgeon: Irene Shipper, MD;  Location: [facility];   Service: Endoscopy;;    ROS: Review of Systems Negative except as stated above  PHYSICAL EXAM: BP 115/71 (BP Location: Left Arm, Patient Position: Sitting, Cuff Size: Large)   Pulse 85   Temp 98.3 F (36.8 C) (Oral)   Ht 5\' 4"  (1.626 m)   Wt 224 lb (101.6 kg)   SpO2 93%   BMI 38.45 kg/m   Wt Readings from Last 3 Encounters:  09/21/22 224 lb (101.6 kg)  08/03/22 230 lb (104.3 kg)  05/07/22 237 lb (107.5 kg)    Physical Exam  General appearance - alert, well appearing, obese older African-American female and in no distress.  She currently has her portable oxygen with her and is on 2 L. Mental status - normal mood, behavior, speech, dress, motor activity, and thought processes Neck - supple, no significant adenopathy.  No thyroid nodules appreciated. Chest - clear to auscultation, no wheezes, rales or rhonchi, symmetric air entry Heart - normal rate, regular rhythm, normal S1, S2, no murmurs, rubs, clicks or gallops Extremities -trace pedal edema      Latest Ref Rng & Units 03/07/2022    4:06 AM 03/06/2022    4:53 AM 03/05/2022    3:06 AM  CMP  Glucose 70 - 99 mg/dL 147  79  220   BUN 8 - 23 mg/dL 28  24  26    Creatinine 0.44 - 1.00 mg/dL 1.26  1.20  1.23   Sodium 135 - 145 mmol/L 142  143  140   Potassium 3.5 - 5.1 mmol/L 4.0  4.1  4.1   Chloride 98 - 111 mmol/L 100  105  102   CO2 22 - 32 mmol/L 31  29  32   Calcium 8.9 - 10.3 mg/dL 9.0  9.1  9.0    Lipid Panel     Component Value Date/Time   CHOL 120 08/21/2021 1522   TRIG 91 08/21/2021 1522   HDL 53 08/21/2021 1522   CHOLHDL 2.3 08/21/2021 1522   CHOLHDL 2.6 01/28/2021 0523   VLDL 30 01/28/2021 0523   LDLCALC 50 08/21/2021 1522    CBC    Component Value Date/Time   WBC 8.9 03/07/2022 0239   RBC 4.76 03/07/2022 0239   HGB 13.4 03/07/2022 0239   HGB 13.3 08/30/2020 1141   HCT 43.0 03/07/2022 0239   HCT 41.9 08/30/2020 1141   PLT 229 03/07/2022 0239   PLT 128 (L) 08/30/2020 1141   MCV 90.3 03/07/2022  0239   MCV 88 08/30/2020 1141   MCH 28.2 03/07/2022 0239   MCHC 31.2 03/07/2022 0239   RDW 15.3 03/07/2022 0239   RDW 15.2 08/30/2020 1141   LYMPHSABS 1.7 03/03/2022 0551   LYMPHSABS 4.3 (H) 08/23/2017 1655   MONOABS 0.3 03/03/2022 0551   EOSABS 0.2 03/03/2022 0551   EOSABS 0.4 08/23/2017 1655   BASOSABS 0.0 03/03/2022 0551   BASOSABS 0.1 08/23/2017 1655    ASSESSMENT AND PLAN:  1. Type 2 diabetes mellitus with morbid obesity ([place]) Not at goal but A1c has improved.  She does not have a log book with her today for [place] to make adjustments.  Advised patient that she can send in her logs for me to take a look and I will get back to her.  On the other hand we can have her follow-up with our clinical pharmacist in 1 month with her log if she is not able to send it in for me to review. Continue current dose of Humalog 75/25 50 units in the morning, 40 units in the evening and Jardiance - POCT glucose (manual entry) - POCT glycosylated hemoglobin (Hb A1C) - Microalbumin / creatinine urine ratio - Comprehensive metabolic panel - Lipid panel  2. Severe persistent chronic asthma without complication Stable.  Continue Symbicort  3. Chronic diastolic heart failure (HCC) Compensated and weight is down.  She will continue furosemide, Jardiance,  4. PAF (paroxysmal atrial fibrillation) (HCC) Sounds to be in sinus rhythm today.  She will continue current dose of Cardizem, atenolol and Eliquis.  5. Stage 3a chronic kidney disease ([place]) We will continue to monitor.  No NSAIDs.  6. Chronic respiratory failure with hypoxia, on home oxygen therapy ([place]) Continue home O2  7. Thyroid nodule - TSH+T4F+T3Free - US THYROID; Future  8. Encounter for screening mammogram for malignant neoplasm of breast - MM Digital Screening; Future  9. Need for shingles vaccine Patient agreeable to receiving Shingrix vaccine.  Prescription given for her to take to her pharmacy to get the first shot. - Zoster  Vaccine Adjuvanted [facility]) injection; Inject 0.5 mLs into the muscle once for 1 dose.  Dispense: 0.5 mL; Refill: 0    Patient was given the opportunity to ask questions.  Patient verbalized understanding of the plan and was able  to repeat key elements of the plan.   This documentation was completed using Radio producer.  Any transcriptional errors are unintentional.  Orders Placed This Encounter  Procedures   POCT glucose (manual entry)   POCT glycosylated hemoglobin (Hb A1C)     Requested Prescriptions    No prescriptions requested or ordered in this encounter    No follow-ups on file.  Karle Plumber, MD, FACP

## 2022-09-22 ENCOUNTER — Other Ambulatory Visit: Payer: Self-pay

## 2022-09-22 ENCOUNTER — Other Ambulatory Visit (HOSPITAL_COMMUNITY): Payer: Self-pay

## 2022-09-22 DIAGNOSIS — H04123 Dry eye syndrome of bilateral lacrimal glands: Secondary | ICD-10-CM | POA: Diagnosis not present

## 2022-09-22 DIAGNOSIS — E103511 Type 1 diabetes mellitus with proliferative diabetic retinopathy with macular edema, right eye: Secondary | ICD-10-CM | POA: Diagnosis not present

## 2022-09-22 DIAGNOSIS — H40021 Open angle with borderline findings, high risk, right eye: Secondary | ICD-10-CM | POA: Diagnosis not present

## 2022-09-22 DIAGNOSIS — M545 Low back pain, unspecified: Secondary | ICD-10-CM

## 2022-09-22 DIAGNOSIS — E103492 Type 1 diabetes mellitus with severe nonproliferative diabetic retinopathy without macular edema, left eye: Secondary | ICD-10-CM | POA: Diagnosis not present

## 2022-09-22 DIAGNOSIS — H40051 Ocular hypertension, right eye: Secondary | ICD-10-CM | POA: Diagnosis not present

## 2022-09-22 LAB — MICROALBUMIN / CREATININE URINE RATIO
Creatinine, Urine: 118.5 mg/dL
Microalb/Creat Ratio: 9 mg/g creat (ref 0–29)
Microalbumin, Urine: 10.1 ug/mL

## 2022-09-22 LAB — COMPREHENSIVE METABOLIC PANEL
ALT: 17 IU/L (ref 0–32)
AST: 20 IU/L (ref 0–40)
Albumin/Globulin Ratio: 1.9 (ref 1.2–2.2)
Albumin: 4.6 g/dL (ref 3.9–4.9)
Alkaline Phosphatase: 116 IU/L (ref 44–121)
BUN/Creatinine Ratio: 11 — ABNORMAL LOW (ref 12–28)
BUN: 13 mg/dL (ref 8–27)
Bilirubin Total: <0.2 mg/dL (ref 0.0–1.2)
CO2: 24 mmol/L (ref 20–29)
Calcium: 9.6 mg/dL (ref 8.7–10.3)
Chloride: 107 mmol/L — ABNORMAL HIGH (ref 96–106)
Creatinine, Ser: 1.15 mg/dL — ABNORMAL HIGH (ref 0.57–1.00)
Globulin, Total: 2.4 g/dL (ref 1.5–4.5)
Glucose: 128 mg/dL — ABNORMAL HIGH (ref 70–99)
Potassium: 4.1 mmol/L (ref 3.5–5.2)
Sodium: 146 mmol/L — ABNORMAL HIGH (ref 134–144)
Total Protein: 7 g/dL (ref 6.0–8.5)
eGFR: 51 mL/min/{1.73_m2} — ABNORMAL LOW (ref >59)

## 2022-09-22 LAB — LIPID PANEL
Chol/HDL Ratio: 2 ratio (ref 0.0–4.4)
Cholesterol, Total: 125 mg/dL (ref 100–199)
HDL: 63 mg/dL (ref >39)
LDL Chol Calc (NIH): 37 mg/dL (ref 0–99)
Triglycerides: 148 mg/dL (ref 0–149)
VLDL Cholesterol Cal: 25 mg/dL (ref 5–40)

## 2022-09-22 LAB — TSH+T4F+T3FREE
Free T4: 0.99 ng/dL (ref 0.82–1.77)
T3, Free: 3.1 pg/mL (ref 2.0–4.4)
TSH: 1.4 u[IU]/mL (ref 0.450–4.500)

## 2022-09-23 NOTE — Telephone Encounter (Signed)
Requested medication (s) are due for refill today: yes  Requested medication (s) are on the active medication list: yes  Last refill:  07/28/22  Future visit scheduled: yes  Notes to clinic:  Unable to refill per protocol, cannot delegate.      Requested Prescriptions  Pending Prescriptions Disp Refills   tiZANidine (ZANAFLEX) 2 MG tablet 40 tablet 1    Sig: Take 1 tablet (2 mg total) by mouth 2 (two) times daily as needed for muscle spasms. Each prescription to last 1 mth.     Not Delegated - Cardiovascular:  Alpha-2 Agonists - tizanidine Failed - 09/22/2022  3:38 PM      Failed - This refill cannot be delegated      Passed - Valid encounter within last 6 months    Recent Outpatient Visits           2 days ago Type 2 diabetes mellitus with morbid obesity [facility])   [place] Ladell Pier, MD   5 months ago Hospital discharge follow-up   [place], MD   10 months ago Type 2 diabetes mellitus with morbid obesity [facility])   [place] Ladell Pier, MD   1 year ago No-show for appointment   [place] Plumber B, MD   1 year ago Type 2 diabetes mellitus with morbid obesity [facility])   [place], MD       Future Appointments             In 1 month Patwardhan, Reynold Bowen, MD [facility]   In 4 months Ladell Pier, MD [place]

## 2022-09-24 ENCOUNTER — Other Ambulatory Visit: Payer: Self-pay

## 2022-09-24 NOTE — Telephone Encounter (Signed)
Pt is calling in to check the status of this refill. Pt says [facility] told her she could get this prescription filled on the 18th of this month. Please advise.

## 2022-09-28 ENCOUNTER — Ambulatory Visit: Payer: Self-pay | Admitting: Licensed Clinical Social Worker

## 2022-09-28 ENCOUNTER — Telehealth: Payer: Self-pay | Admitting: Licensed Clinical Social Worker

## 2022-09-28 ENCOUNTER — Telehealth: Payer: Self-pay | Admitting: Pharmacy Technician

## 2022-09-28 DIAGNOSIS — Z596 Low income: Secondary | ICD-10-CM

## 2022-09-28 NOTE — Patient Outreach (Signed)
  Care Coordination   09/28/2022 Name: Heidi [name] MRN: [id number] DOB: 13-Jan-1951   Care Coordination Outreach Attempts:  An unsuccessful telephone outreach was attempted for a scheduled appointment today.  Follow Up Plan:  Additional outreach attempts will be made to offer the patient care coordination information and services.   Encounter Outcome:  No Answer   Care Coordination Interventions:  No, not indicated    Christa See, MSW, [email or web address] Phone [phone] 1:22 PM

## 2022-09-28 NOTE — Progress Notes (Signed)
[facility])                                            [place] Team    09/28/2022  Seila Fortman 27-Nov-1950 [id number]  Received both patient and provider portion(s) of patient assistance application(s) for Ubrelvy. Faxed completed application and required documents into [employer].  Ahnna Dungan P. Lashanda Storlie, [place]  [phone]

## 2022-09-29 NOTE — Patient Instructions (Signed)
Visit Information  Thank you for taking time to visit with me today. Please don't hesitate to contact me if I can be of assistance to you.   Following are the goals we discussed today:   Goals Addressed             This Visit's Progress    Provide Supportive Resources-Safe Housing   On track    Activities and task to complete in order to accomplish goals.   Review resources provided by [employer] preferred apartments to inquire about [employer] through [facility] to inquire about safety of residence Inform LCSW and/or RNCM at [facility] if a Legal Aid referral is needed to assist with home needs Continue utilizing healthy coping skills to assist with stress management          Our next appointment is by telephone on 4/22 at 11 AM  Please call the care guide team at [phone] if you need to cancel or reschedule your appointment.   If you are experiencing a Mental Health or [place] or need someone to talk to, please call the Suicide and Crisis Lifeline: 988 call 911   The patient verbalized understanding of instructions, educational materials, and care plan provided today and DECLINED offer to receive copy of patient instructions, educational materials, and care plan.   Christa See, MSW, [email or web address] Phone [phone] 10:21 AM

## 2022-09-29 NOTE — Patient Outreach (Signed)
  Care Coordination   Follow Up Visit Note   09/28/22 Name: Heidi [name] MRN: [id number] DOB: [birth year]  Heidi [name] is a 72 y.o. year old female who sees Heidi Pier, MD for primary care. I spoke with  Heidi [name] by phone today.  What matters to the patients health and wellness today?  Housing/Symptom Management    Goals Addressed             This Visit's Progress    Provide Supportive Resources-Safe Housing   On track    Activities and task to complete in order to accomplish goals.   Review resources provided by [employer] preferred apartments to inquire about units [employer] through [facility] to inquire about safety of residence Inform LCSW and/or RNCM at [facility] if a Legal Aid referral is needed to assist with home needs Continue utilizing healthy coping skills to assist with stress management          SDOH assessments and interventions completed:  No     Care Coordination Interventions:  Yes, provided  Interventions Today    Flowsheet Row Most Recent Value  Chronic Disease   Chronic disease during today's visit Hypertension (HTN), Congestive Heart Failure (CHF), Diabetes, Chronic Kidney Disease/End Stage Renal Disease (ESRD), Other  [Anxiety and Depression and Chronic Pain]  General Interventions   General Interventions Discussed/Reviewed General Interventions Reviewed, [employer] receive housing resources from [facility]. LCSW encouraged her to review listing and reach out to interested properties. Reviewed upcoming appts and any barriers. Discussed Legal Aid referral, if needed]  Mental Health Interventions   Mental Health Discussed/Reviewed Mental Health Reviewed, Coping Strategies, Anxiety, Depression  [Pt identified stressors and LCSW discussed coping skills to decrease symptoms. Encouragement and validation provided. Active listening and reflection utilized]       Follow up plan: Follow up call  scheduled for 2-4 weeks    Encounter Outcome:  Pt. Visit Completed   Heidi [name], MSW, [email or web address] Phone [phone] 10:20 AM

## 2022-10-01 ENCOUNTER — Telehealth: Payer: Self-pay | Admitting: Pharmacist

## 2022-10-01 NOTE — Progress Notes (Signed)
[facility])                                            [place] Team    10/01/2022  Heidi [name] 04-02-1951 [id number]  Placed telephonic outreach to [name]. Updates given for Ubrelvy application status and current OOP spend via [facility] for submission to patient assistance with Eliquis.  Provided patient and her daughter information to create a [email or web address] account in order to get a copy of her current OOP spend. Will place follow up call to patient as updates are available.   Loretha Brasil, PharmD [place] Clinical Pharmacist Direct Dial: [phone]

## 2022-10-02 ENCOUNTER — Telehealth: Payer: Self-pay | Admitting: Pharmacist

## 2022-10-02 ENCOUNTER — Other Ambulatory Visit: Payer: Self-pay

## 2022-10-02 NOTE — Progress Notes (Signed)
[facility])                                            [place] Team    10/02/2022  Heidi [name] Dec 31, 1950 [id number]  Placed telephonic outreach to [name] today. Advised patient that her OOP spend reports from [place] are enough to submit to [employer] for approval of Eliquis patient assistance. [name] will attempt to go to both pharmacies over the next couple of days to get the pharmacy printouts and fax them to [facility]. Fax number verified with patient.   Loretha Brasil, PharmD [place] Clinical Pharmacist Direct Dial: [phone]

## 2022-10-06 ENCOUNTER — Other Ambulatory Visit: Payer: Self-pay | Admitting: Internal Medicine

## 2022-10-06 DIAGNOSIS — M545 Low back pain, unspecified: Secondary | ICD-10-CM

## 2022-10-06 NOTE — Telephone Encounter (Signed)
The patient called back in stating she called over a week ago to get a refill on her tiZANidine (ZANAFLEX) 2 MG tablet and the pharmacy stated the request for refill was too soon at the time. She uses    [facility] [place], [address] Phone: [phone]  Fax: [phone]     Please assist patient further

## 2022-10-06 NOTE — Telephone Encounter (Signed)
Requested medications are due for refill today.  unsure  Requested medications are on the active medications list.  yes  Last refill. 07/28/2022 #40 1 rf  Future visit scheduled.   yes  Notes to clinic.  Refill not delegated.    Requested Prescriptions  Pending Prescriptions Disp Refills   tiZANidine (ZANAFLEX) 2 MG tablet 40 tablet 1    Sig: Take 1 tablet (2 mg total) by mouth 2 (two) times daily as needed for muscle spasms. Each prescription to last 1 mth.     Not Delegated - Cardiovascular:  Alpha-2 Agonists - tizanidine Failed - 10/06/2022  1:21 PM      Failed - This refill cannot be delegated      Passed - Valid encounter within last 6 months    Recent Outpatient Visits           2 weeks ago Type 2 diabetes mellitus with morbid obesity [facility])   [place] Ladell Pier, MD   5 months ago Hospital discharge follow-up   [place], MD   11 months ago Type 2 diabetes mellitus with morbid obesity [facility])   [place] Ladell Pier, MD   1 year ago No-show for appointment   [place] Karle Plumber B, MD   1 year ago Type 2 diabetes mellitus with morbid obesity [facility])   [place], MD       Future Appointments             In 3 weeks [place], Reynold Bowen, MD [facility]   In 3 months Ladell Pier, MD [place]

## 2022-10-07 ENCOUNTER — Other Ambulatory Visit: Payer: Self-pay

## 2022-10-08 ENCOUNTER — Telehealth: Payer: Self-pay | Admitting: Pharmacy Technician

## 2022-10-08 DIAGNOSIS — Z5986 Financial insecurity: Secondary | ICD-10-CM

## 2022-10-08 NOTE — Progress Notes (Signed)
[name])                                            [place] Team    10/08/2022  Heidi [name] 1950-08-28 [id number]                                      Medication Assistance Referral  Referral From: [place]  Medication/Company: Humalog Mix 75/25  / [employer] Patient application portion:  N/A patient signed in office Provider application portion: Faxed  to Dr. Karle Plumber Provider address/fax verified via: Office website   Received patient OOP spend report for the year for Eliquis. Faxed required documents into BMS.    Arshia Spellman P. Gredmarie Delange, [place]  [phone]

## 2022-10-12 ENCOUNTER — Other Ambulatory Visit: Payer: Self-pay

## 2022-10-13 ENCOUNTER — Telehealth: Payer: Self-pay | Admitting: Pharmacy Technician

## 2022-10-13 DIAGNOSIS — Z5986 Financial insecurity: Secondary | ICD-10-CM

## 2022-10-13 NOTE — Progress Notes (Signed)
[facility])                                            [facility] Quality Pharmacy Team    10/13/2022  Heidi [name] 05/11/1951 [id number]  Care coordination call placed to [employer] in regard to [facility] application and a care coordination call was placed to BMS in regard to Eliquis  Spoke to [place] at [place]  who informs patient is DENIED for [name] due to having LIS per their benefits verification. Informed [name] that patient has a denial termination letter for LIS. He informs to fax that over to [employer] with her name and DOB placed on the letter and cover sheet along with with the words"Please appeal denial due to not having LIS" on the cover page. He informs it will then be reconsidered and another determination letter will be initiated once the appeal is finished processing.   Spoke to [place] at [facility] who informs patient is APPROVED 10/10/22-07/06/23 for Eliquis. She informs medication will automatically fill and ship each time it is due with delivery to the patient's home address. If patient feels current supply is not adequate until next shipment is due, then patient can call BMS at [phone].  Heidi [name], CPhT [facility] [employer]  [phone]

## 2022-10-15 ENCOUNTER — Telehealth: Payer: Self-pay | Admitting: Licensed Clinical Social Worker

## 2022-10-15 ENCOUNTER — Other Ambulatory Visit: Payer: Self-pay

## 2022-10-15 ENCOUNTER — Other Ambulatory Visit: Payer: Self-pay | Admitting: Internal Medicine

## 2022-10-15 NOTE — Telephone Encounter (Signed)
Copied from CRM [id number]. Topic: General - Inquiry >> Oct 15, 2022  2:00 PM [place] wrote: Reason for CRM: Pt is requesting a callback from Jenel Lucks. She stated she wanted to discuss issues with her current living situation. Please advise.

## 2022-10-15 NOTE — Telephone Encounter (Signed)
Received letter from [employer]  that pt was denied for ubrelvy when she is enrolled in [employer] LIS.  There is a note from [employer] [facility]) [employer]. CPHT stating: Spoke to [name] at [place]  who informs patient is DENIED for [name] due to having LIS per their benefits verification. Informed [name] that patient has a denial termination letter for LIS. He informs to fax that over to [employer] with her name and DOB placed on the letter and cover sheet along with with the words"Please appeal denial due to not having LIS" on the cover page. He informs it will then be reconsidered and another determination letter will be initiated once the appeal is finished processing.   I faxed letter as per above.  Faxed confirmation received [phone]

## 2022-10-16 ENCOUNTER — Telehealth: Payer: Self-pay | Admitting: Licensed Clinical Social Worker

## 2022-10-16 ENCOUNTER — Other Ambulatory Visit: Payer: Self-pay

## 2022-10-19 NOTE — Patient Outreach (Signed)
  Care Coordination   Follow Up Visit Note   10/16/2022 Name: Amenia Devault MRN: [id number] DOB: 1951-04-02  Marleen Bringhurst is a 72 y.o. year old female who sees Marcine Matar, MD for primary care. I spoke with  [name] by phone today.  What matters to the patients health and wellness today?  Symptom Management/Resources    Goals Addressed             This Visit's Progress    Provide Supportive Resources-Safe Housing   On track    Activities and task to complete in order to accomplish goals.   Review resources provided by [employer] preferred apartments to inquire about units [employer] through [facility] to inquire about safety of residence Inform LCSW and/or RNCM at [facility] if a Legal Aid referral is needed to assist with home needs Continue utilizing healthy coping skills to assist with stress management          SDOH assessments and interventions completed:  No     Care Coordination Interventions:  Yes, provided  Interventions Today    Flowsheet Row Most Recent Value  Chronic Disease   Chronic disease during today's visit Hypertension (HTN), Congestive Heart Failure (CHF), Diabetes, Chronic Kidney Disease/End Stage Renal Disease (ESRD)  General Interventions   General Interventions Discussed/Reviewed [employer], General Interventions Reviewed, Doctor Visits  [Patient agreed to log daily blood sugars and will provide to PCP office for review]  Doctor Visits Discussed/Reviewed Doctor Visits Reviewed  Mental Health Interventions   Mental Health Discussed/Reviewed Mental Health Reviewed, Coping Strategies, Anxiety  [Stress and anxiety management. Motivated to move due to increase in theft and concerns regarding unit's smoke alarms]  Pharmacy Interventions   Pharmacy Dicussed/Reviewed Pharmacy Topics Reviewed, Medication Adherence  [Eliquis came in Pt will f/up with [name] the Ubelvy for migraine meds]  Safety  Interventions   Safety Discussed/Reviewed Home Safety       Follow up plan: Follow up call scheduled for 2-4 weeks    Encounter Outcome:  Pt. Visit Completed   Jenel Lucks, MSW, LCSW [facility] Care Management [facility] Health  [facility] [email or web address] Phone [phone] 5:36 PM

## 2022-10-19 NOTE — Patient Instructions (Signed)
Visit Information  Thank you for taking time to visit with me today. Please don't hesitate to contact me if I can be of assistance to you.   Following are the goals we discussed today:   Goals Addressed             This Visit's Progress    Provide Supportive Resources-Safe Housing   On track    Activities and task to complete in order to accomplish goals.   Review resources provided by [employer] preferred apartments to inquire about [employer] Homes through [facility] to inquire about safety of residence Inform LCSW and/or RNCM at [name] if a Legal Aid referral is needed to assist with home needs Continue utilizing healthy coping skills to assist with stress management          Our next appointment is by telephone on 4/22 at 11 AM  Please call the care guide team at [phone] if you need to cancel or reschedule your appointment.   If you are experiencing a Mental Health or Behavioral Health Crisis or need someone to talk to, please call the Suicide and Crisis Lifeline: 988 call 911   The patient verbalized understanding of instructions, educational materials, and care plan provided today and DECLINED offer to receive copy of patient instructions, educational materials, and care plan.   Jenel Lucks, MSW, LCSW [facility] Care Management [place]  [facility] [email or web address] Phone [phone] 5:36 PM

## 2022-10-22 ENCOUNTER — Telehealth: Payer: Self-pay | Admitting: Pharmacy Technician

## 2022-10-22 ENCOUNTER — Ambulatory Visit: Payer: Medicare HMO | Admitting: Pharmacist

## 2022-10-22 DIAGNOSIS — Z5986 Financial insecurity: Secondary | ICD-10-CM

## 2022-10-22 NOTE — Progress Notes (Signed)
[facility])                                            [facility] Quality Pharmacy Team    10/22/2022  Heidi [name] [birth year] [id number]  Received both patient and provider portion(s) of patient assistance application(s) for Humalog Mix 75/25. Faxed completed application and required documents into [employer]. Received termination notice of LIS for patient and submitted to [employer] per their request in regard to [facility] application.   Vinisha Faxon P. Marcellius Montagna, CPhT [facility] [employer]  [phone]

## 2022-10-23 ENCOUNTER — Other Ambulatory Visit: Payer: Self-pay | Admitting: Internal Medicine

## 2022-10-23 ENCOUNTER — Telehealth: Payer: Self-pay | Admitting: Primary Care

## 2022-10-23 ENCOUNTER — Telehealth: Payer: Self-pay | Admitting: Pharmacist

## 2022-10-23 ENCOUNTER — Other Ambulatory Visit: Payer: Self-pay | Admitting: Pharmacist

## 2022-10-23 ENCOUNTER — Telehealth: Payer: Self-pay | Admitting: Pharmacy Technician

## 2022-10-23 ENCOUNTER — Other Ambulatory Visit: Payer: Self-pay

## 2022-10-23 DIAGNOSIS — J9611 Chronic respiratory failure with hypoxia: Secondary | ICD-10-CM

## 2022-10-23 DIAGNOSIS — Z5986 Financial insecurity: Secondary | ICD-10-CM

## 2022-10-23 MED ORDER — HUMALOG MIX 75/25 (75-25) 100 UNIT/ML ~~LOC~~ SUSP
SUBCUTANEOUS | 0 refills | Status: DC
  Filled 2022-10-23: qty 30, 33d supply, fill #0

## 2022-10-23 NOTE — Progress Notes (Signed)
[facility] Customer service [facility])                                            [facility] Quality Pharmacy Team    10/23/2022  Shatoya Roets [birth year] [id number]  Placed return call to Mrs. Georgeanne Nim as I had a voicemail to call her regarding insulin. Mrs. Hickey stated that she was out of her Humalog, she reported having about 30 units on hand. Jill Simcox, CPhT had already placed a call to [employer] patient assistance company and was able to get the approval for her Humalog expedited. Noreene Larsson was able to coordinate an expedited delivery for the Humalog which is set to be delivered via UPS on Saturday 10/24/2022 to patient's home.   Placed a call to [employer] at [employer] and confirmed patient has a script on file for 3 vials of the Humalog at a cost of $11.20. Returned call to patient and left two separate voice mails on her phone letting her know that the delivery of Humalog is set to arrive to her tomorrow 10/24/2022. I also left detailed instructions on both of my voice mails that if she has not received her shipment within a reasonable time frame, she has a script on file at [facility] and left the phone number for [facility] location as well as the address with instructions to call the [facility] location and have her Humalog filled. I left the time that [facility] closes on both voice mails of 430 pm. I advised patient to make a plan to pick up her Humalog prior to 430 pm if she has not received her shipment of insulin from the patient assistance company at a reasonable time; to ensure that she has insulin on hand for the weekend.   Reynold Bowen, PharmD [facility] Health  [facility] Clinical Pharmacist Direct Dial: [phone]

## 2022-10-23 NOTE — Progress Notes (Signed)
[facility])                                            [facility] Quality Pharmacy Team    10/23/2022  Heidi [name] Mar 24, 1951 [id number]  Care coordination call placed to [employer] in regard to Humalog Mix 75/25 100units/ml vial application.  Spoke to Angelique who informs patient is APPROVED 10/22/22-07/06/23. Medication should be delivered in the next 7-10 business days to the patient's home. Inquired if medication could be expedited as patient is out of medication but Angelique was not sure and transferred me to their pharmacy as have received 2 messages, one from [place] with Dr. Gavin Pound Johnson's office (a v/m message) and one from April Procita, Rph at [facility] (Secure chat message) stating patient was out of medication. Attempted to call Barbara Cower back but had to leave a message. Sent secure chat message back to [place] informing that typically [employer] does not expedite deliveries and to have medication called into a local pharmacy for patient to pick up.  Care coordination call placed to [place], Lilly's pharmacy to inquire if Humalog Mix 75/25 could be delivered stat to patient's home.   Spoke to [place] who will get order processed today and delivered to the patient;s home tomorrow. All future fills will be processed automatically as patient is set up on auto fill and delivery. However, if patient does not have enough supply to last until next fill date then she can call [employer] at [phone] or their pharmacy [facility] at [phone].  Sent an updated secure message to April Procita, Rph with this information and inquire if she would also update Jason. Updated [facility] PharmD Reynold Bowen who will outreach patient with the above information.   Heidi [name], CPhT [facility] [employer]  [phone]

## 2022-10-23 NOTE — Telephone Encounter (Signed)
PT states [employer] told her the RX for portable O2 expired due to lack of communication.  They told her we need to resend a new RX  Pls call PT to advise @ [phone].

## 2022-10-26 ENCOUNTER — Ambulatory Visit: Payer: Self-pay | Admitting: Licensed Clinical Social Worker

## 2022-10-26 NOTE — Telephone Encounter (Signed)
Left message for patient to call back  

## 2022-10-27 NOTE — Patient Instructions (Signed)
Visit Information  Thank you for taking time to visit with me today. Please don't hesitate to contact me if I can be of assistance to you.   Following are the goals we discussed today:   Goals Addressed             This Visit's Progress    Provide Supportive Resources-Safe Housing   On track    Activities and task to complete in order to accomplish goals.   Review resources provided by [employer] preferred apartments to inquire about [employer] Homes through [facility] to inquire about safety of residence Inform LCSW and/or RNCM at [facility] if a Legal Aid referral is needed to assist with home needs Continue utilizing healthy coping skills to assist with stress management          Our next appointment is by telephone on 5/20 at 11 AM  Please call the care guide team at [phone] if you need to cancel or reschedule your appointment.   If you are experiencing a Mental Health or Behavioral Health Crisis or need someone to talk to, please call the Suicide and Crisis Lifeline: 988 call 911   The patient verbalized understanding of instructions, educational materials, and care plan provided today and DECLINED offer to receive copy of patient instructions, educational materials, and care plan.   Jenel Lucks, MSW, LCSW [facility] Care Management [place]  [facility] [email or web address] Phone [phone] 10:59 PM

## 2022-10-27 NOTE — Patient Outreach (Signed)
  Care Coordination   Follow Up Visit Note   10/26/2022 Name: Heidi [name] MRN: [id number] DOB: 10-20-1950  Heidi [name] is a 72 y.o. year old female who sees Heidi Matar, MD for primary care. I spoke with  Heidi [name] by phone today.  What matters to the patients health and wellness today?  Symptom Management    Goals Addressed             This Visit's Progress    Provide Supportive Resources-Safe Housing   On track    Activities and task to complete in order to accomplish goals.   Review resources provided by [employer] preferred apartments to inquire about units [employer] through [facility] to inquire about safety of residence Inform LCSW and/or RNCM at [facility] if a Legal Aid referral is needed to assist with home needs Continue utilizing healthy coping skills to assist with stress management          SDOH assessments and interventions completed:  No     Care Coordination Interventions:  Yes, provided  Interventions Today    Flowsheet Row Most Recent Value  Chronic Disease   Chronic disease during today's visit Hypertension (HTN), Congestive Heart Failure (CHF), Diabetes  General Interventions   General Interventions Discussed/Reviewed General Interventions Reviewed  [Pt is waiting for Pulmonalogist to reach out to [employer] regarding O2 needs. Agreed to f/up with specialist by end of week. Reviewed upcoming appts]  Doctor Visits Discussed/Reviewed Specialist  Mental Health Interventions   Mental Health Discussed/Reviewed Mental Health Reviewed, Coping Strategies, Anxiety, Depression  Pharmacy Interventions   Pharmacy Dicussed/Reviewed Pharmacy Topics Reviewed, Medication Adherence  [Patient is awaiting delivery of insulin. Reports compliance with  medications]       Follow up plan: Follow up call scheduled for 2-4 weeks    Encounter Outcome:  Pt. Visit Completed   Heidi [name], MSW, LCSW [facility] Care  Management [facility]  [facility] [name] Phone [phone] 10:58 PM

## 2022-10-29 ENCOUNTER — Other Ambulatory Visit (HOSPITAL_COMMUNITY): Payer: Self-pay

## 2022-10-29 ENCOUNTER — Telehealth: Payer: Self-pay | Admitting: Neurology

## 2022-10-29 ENCOUNTER — Telehealth: Payer: Self-pay | Admitting: Pharmacist

## 2022-10-29 DIAGNOSIS — E103492 Type 1 diabetes mellitus with severe nonproliferative diabetic retinopathy without macular edema, left eye: Secondary | ICD-10-CM | POA: Diagnosis not present

## 2022-10-29 DIAGNOSIS — H04123 Dry eye syndrome of bilateral lacrimal glands: Secondary | ICD-10-CM | POA: Diagnosis not present

## 2022-10-29 DIAGNOSIS — E103511 Type 1 diabetes mellitus with proliferative diabetic retinopathy with macular edema, right eye: Secondary | ICD-10-CM | POA: Diagnosis not present

## 2022-10-29 DIAGNOSIS — H401112 Primary open-angle glaucoma, right eye, moderate stage: Secondary | ICD-10-CM | POA: Diagnosis not present

## 2022-10-29 NOTE — Progress Notes (Signed)
[facility] Customer service manager [facility])                                            [facility] Quality Pharmacy Team    10/29/2022  Heidi [name] 20-Feb-1951 [id number]  Placed telephone outreach to patient. Confirmed that patient received her shipment from [employer] on 10/24/2022 for her Humalog Mix 75/25.  Provided patient with update on status of Ubrelvy patient assistance case. Patient inquires about samples. Outreach placed to patient's neurology office for request of samples.  Reynold Bowen, PharmD [facility] Health  [facility] Clinical Pharmacist Direct Dial: [phone]

## 2022-10-29 NOTE — Telephone Encounter (Signed)
Pt called the office back stating that she was needing a new order placed as the prior order that had been sent over was expired. New order placed. Nothing further needed.

## 2022-10-29 NOTE — Telephone Encounter (Signed)
PT. Calling back to speak to nurse if we could try to call her again

## 2022-10-29 NOTE — Telephone Encounter (Signed)
[name] called [facility], Asking if pt can get samples of Ubrogepant (UBRELVY) 100 MG TABS. Stated pt is applying for pt assistance.

## 2022-10-30 ENCOUNTER — Other Ambulatory Visit (HOSPITAL_COMMUNITY): Payer: Self-pay

## 2022-10-30 ENCOUNTER — Other Ambulatory Visit: Payer: Self-pay

## 2022-10-30 ENCOUNTER — Ambulatory Visit: Payer: Medicare HMO | Admitting: Cardiology

## 2022-11-02 ENCOUNTER — Telehealth: Payer: Self-pay | Admitting: Primary Care

## 2022-11-02 ENCOUNTER — Other Ambulatory Visit: Payer: Self-pay

## 2022-11-02 NOTE — Telephone Encounter (Signed)
I called and LMVM for [name] at [facility] that we do not per Dr. Lucia Gaskins that many ubrelvy samples.  I see that you all are assisting with pat asistance, let us know if you need anything for [place].

## 2022-11-02 NOTE — Telephone Encounter (Signed)
Pt calling in bc she was told we need to send over stats to [employer] so she can get her Portable oxygen

## 2022-11-02 NOTE — Telephone Encounter (Signed)
Unfortunately we don;t have that many samples of ubrelvy to give out. Sorry

## 2022-11-03 ENCOUNTER — Other Ambulatory Visit: Payer: Self-pay | Admitting: Internal Medicine

## 2022-11-03 DIAGNOSIS — I10 Essential (primary) hypertension: Secondary | ICD-10-CM

## 2022-11-04 ENCOUNTER — Ambulatory Visit: Payer: Medicare HMO | Admitting: Cardiology

## 2022-11-05 NOTE — Telephone Encounter (Signed)
Per referral notes, she needs ov and POC walk- no recent sats!   I called pt and there was no answer- LMTCB   She needs to be sure and keep appt with ND 11/16/22 and we can do her walk at that time

## 2022-11-05 NOTE — Telephone Encounter (Signed)
PT calling again about [employer] needing stats. Please call PT to advise action taken. Her # [phone]   She is trying to get the [facility] O2 but she needs to sched a walk test. But priority is tanks right now. She is has just a little in the tank left so sending back high priority.

## 2022-11-06 NOTE — Telephone Encounter (Signed)
I called and spoke to [name] she is going to work on this and will call the on call Doc if need be

## 2022-11-06 NOTE — Telephone Encounter (Signed)
I spoke with the pt  She states that she is in need of back up tanks until she can get her POC  She is scheduled for appt 11/16/22 and will be walked for POC at that time  She understands will not get POC until then   She needs back up tanks and does not have any right now so unable to leave her house  I called Melissa and she did not answer and her VM is full   PCC's this is urgent- please help with this.

## 2022-11-06 NOTE — Telephone Encounter (Signed)
Patient is calling again regarding her oxygen tank.  She stated that [employer] needs the office to send a script for te 02.  Please advise and call patient to discuss further.  CB# [phone]

## 2022-11-11 NOTE — Telephone Encounter (Signed)
Patient received her equipment 11/06/2022

## 2022-11-12 ENCOUNTER — Telehealth: Payer: Self-pay | Admitting: Pharmacist

## 2022-11-12 ENCOUNTER — Other Ambulatory Visit: Payer: Self-pay

## 2022-11-12 ENCOUNTER — Other Ambulatory Visit: Payer: Self-pay | Admitting: Internal Medicine

## 2022-11-12 MED ORDER — EMPAGLIFLOZIN 10 MG PO TABS
10.0000 mg | ORAL_TABLET | Freq: Every day | ORAL | 3 refills | Status: DC
  Filled 2022-11-12: qty 30, 30d supply, fill #0

## 2022-11-12 NOTE — Progress Notes (Signed)
[facility])                                            [facility] Quality Pharmacy Team    11/12/2022  Shadara Klugman 03-26-1951 [id number]  Placed outreach to patient regarding follow for Ubrelvy patient assistance trough [employer]. Denyse Amass is now requiring letter from social security from 2024 regarding denial for Medicare Extra Help program. Patient will fax this to me.    Reynold Bowen, PharmD [facility] Health  [facility] Clinical Pharmacist Direct Dial: [phone]

## 2022-11-12 NOTE — Telephone Encounter (Signed)
Requested medication (s) are due for refill today: yes  Requested medication (s) are on the active medication list: yes  Last refill:  07/29/22 #30 3 refills   Future visit scheduled: yes in 3 months   Notes to clinic:   protocol failed last labs 09/21/22 do you want to refill Rx ?      Requested Prescriptions  Pending Prescriptions Disp Refills   empagliflozin (JARDIANCE) 10 MG TABS tablet 30 tablet 3    Sig: Take 1 tablet (10 mg total) by mouth daily.     Endocrinology:  Diabetes - SGLT2 Inhibitors Failed - 11/12/2022  3:58 PM      Failed - Cr in normal range and within 360 days    Creat  Date Value Ref Range Status  05/12/2016 1.14 (H) 0.50 - 0.99 mg/dL Final    Comment:      For patients > or = 71 years of age: The upper reference limit for Creatinine is approximately 13% higher for people identified as African-American.      Creatinine, Ser  Date Value Ref Range Status  09/21/2022 1.15 (H) 0.57 - 1.00 mg/dL Final   Creatinine, Urine  Date Value Ref Range Status  11/27/2019 146.00 mg/dL Final    Comment:    Performed at [facility], [address] [place], [place] [address]         Failed - HBA1C is between 0 and 7.9 and within 180 days    HbA1c, POC (controlled diabetic range)  Date Value Ref Range Status  09/21/2022 8.6 (A) 0.0 - 7.0 % Final         Failed - eGFR in normal range and within 360 days    GFR, Est African American  Date Value Ref Range Status  05/12/2016 59 (L) >=60 mL/min Final   GFR calc Af Amer  Date Value Ref Range Status  08/30/2020 56 (L) >59 mL/min/1.73 Final    Comment:    **In accordance with recommendations from the NKF-ASN Task force,**   [facility] is in the process of updating its eGFR calculation to the   2021 CKD-EPI creatinine equation that estimates kidney function   without a race variable.    GFR, Est Non African American  Date Value Ref Range Status  05/12/2016 51 (L) >=60 mL/min Final   GFR, Estimated  Date  Value Ref Range Status  03/07/2022 46 (L) >60 mL/min Final    Comment:    (NOTE) Calculated using the CKD-EPI Creatinine Equation (2021)    eGFR  Date Value Ref Range Status  09/21/2022 51 (L) >59 mL/min/1.73 Final         Passed - Valid encounter within last 6 months    Recent Outpatient Visits           1 month ago Type 2 diabetes mellitus with morbid obesity [facility])   [place] [facility] Marcine Matar, MD   7 months ago Hospital discharge follow-up   [facility] Jonah Blue B, MD   1 year ago Type 2 diabetes mellitus with morbid obesity [facility])   [place] [facility] Marcine Matar, MD   1 year ago No-show for appointment   [facility] Jonah Blue B, MD   1 year ago Type 2 diabetes mellitus with morbid obesity [facility])   [place] [facility] Marcine Matar, MD  Future Appointments             In 4 days Celine Mans, Olene Craven, MD [facility] [place]   In 2 weeks Patwardhan, Anabel Bene, MD [facility]   In 2 months Marcine Matar, MD [facility]

## 2022-11-12 NOTE — Telephone Encounter (Signed)
Pt called in states pharmacy, [facility] , is [facility] go ahead and send request for her jaudiance med

## 2022-11-12 NOTE — Telephone Encounter (Signed)
Medication Refill - Medication: Jardiance 10 mg  Has the patient contacted their pharmacy? No.  Pt is confused about which pharmacy    (Agent: If no, request that the patient contact the pharmacy for the refill. If patient does not wish to contact the pharmacy document the reason why and proceed with request.) (Agent: If yes, when and what did the pharmacy advise?)  Preferred Pharmacy (with phone number or street name): [facility]  Has the patient been seen for an appointment in the last year OR does the patient have an upcoming appointment? Yes.    Agent: Please be advised that RX refills may take up to 3 business days. We ask that you follow-up with your pharmacy.

## 2022-11-13 ENCOUNTER — Other Ambulatory Visit: Payer: Self-pay

## 2022-11-16 ENCOUNTER — Encounter: Payer: Self-pay | Admitting: Internal Medicine

## 2022-11-16 ENCOUNTER — Ambulatory Visit: Payer: Medicare HMO | Admitting: Internal Medicine

## 2022-11-16 VITALS — BP 126/74 | HR 65 | Temp 98.1°F | Ht 64.0 in | Wt 223.2 lb

## 2022-11-16 DIAGNOSIS — J9611 Chronic respiratory failure with hypoxia: Secondary | ICD-10-CM

## 2022-11-16 DIAGNOSIS — J4540 Moderate persistent asthma, uncomplicated: Secondary | ICD-10-CM | POA: Diagnosis not present

## 2022-11-16 DIAGNOSIS — G4733 Obstructive sleep apnea (adult) (pediatric): Secondary | ICD-10-CM | POA: Diagnosis not present

## 2022-11-16 NOTE — Progress Notes (Signed)
Heidi [name]    [id number]    1951-06-19  Primary Care Physician:Johnson, Binnie Rail, MD Date of Appointment: 11/16/2022 Established Patient Visit  Chief complaint:   Chief Complaint  Patient presents with   Follow-up    Doing ok     HPI: Heidi [name] is a 72 y.o. woman with chronic hypercapnia and hypoxemia on BIPAP, asthma on advair, and covid 19 infection Feb 2022 with severe parenchymal lung injury on [facility] continuously. Start on oxygen August 2021. Also has Chronic HFpEF and is managed with diureitics and is on eliquis for A. Fib.   Interval Updates: Here for follow up. Needs oxygen recertification so had walk test today. Still did not have sleep test.  Went to urgent care feb 27 and treated with steroid injection and then abx. Lasix dose was increased - flare felt to be secondary to heart failure.  Otherwise daily symptoms seem improved in control. She is asking for POC. Has albuterol nebs at home she takes as needed.  DME - [facility]   Current Regimen: symbicort 2 puffs twice a day, albuterol  Asthma Triggers: exercise, cold weather Exacerbations in the last year: 1 since October 2024.  History of hospitalization or intubation: yes, not intubation Allergy Testing: none GERD: denies Allergic Rhinitis: denies ACT:  Asthma Control Test ACT Total Score  11/16/2022  9:27 AM 18  10/09/2021 11:27 AM 15  10/21/2020 11:16 AM 17   FeNO: 8 ppb   I have reviewed the patient's family social and past medical history and updated as appropriate.   Past Medical History:  Diagnosis Date   Adenomatous colon polyp    Anemia    Anxiety    Asthma    CHF (congestive heart failure) (HCC)    COPD (chronic obstructive pulmonary disease) (HCC)    Diabetes mellitus without complication (HCC)    High cholesterol    Hypertension    Left knee injury    Ocular hypertension 08/12/2022   OD   Pneumonia    Sleep apnea    Supplemental oxygen dependent    2L  [place]    Past Surgical History:  Procedure Laterality Date   ABDOMINAL HYSTERECTOMY     CATARACT EXTRACTION Left    CATARACT EXTRACTION     LT 01/2017, 02/2017 on RT   CESAREAN SECTION     COLONOSCOPY WITH PROPOFOL N/A 04/15/2021   Procedure: COLONOSCOPY WITH PROPOFOL;  Surgeon: Hilarie Fredrickson, MD;  Location: [facility];  Service: Endoscopy;  Laterality: N/A;  patient is on O2   JOINT REPLACEMENT Left    Plate in Ankle   LEFT HEART CATH AND CORONARY ANGIOGRAPHY N/A 03/04/2022   Procedure: LEFT HEART CATH AND CORONARY ANGIOGRAPHY;  Surgeon: Elder Negus, MD;  Location: [facility];  Service: Cardiovascular;  Laterality: N/A;   POLYPECTOMY  04/15/2021   Procedure: POLYPECTOMY;  Surgeon: Hilarie Fredrickson, MD;  Location: [facility];  Service: Endoscopy;;    Family History  Problem Relation Age of Onset   Colon cancer Father    Diabetes type II Sister    Diabetes type II Brother    Colon cancer Sister    Migraines Neg Hx    Breast cancer Neg Hx     Social History   Occupational History   Occupation: Disabled  Tobacco Use   Smoking status: Never    Passive exposure: Never   Smokeless tobacco: Never  Vaping Use  Vaping Use: Never used  Substance and Sexual Activity   Alcohol use: No   Drug use: No   Sexual activity: Yes     Physical Exam: Blood pressure 126/74, pulse 65, temperature 98.1 F (36.7 C), temperature source Oral, height 5\' 4"  (1.626 m), weight 223 lb 3.2 oz (101.2 kg), SpO2 93 %.  Gen:      No distress Lungs:  no wheezes or crackles, no increased wob.  CV:      RRR no edema   Data Reviewed: Imaging: I have personally reviewed the CT Chest Feb 2022 and it shows patchy multifocal airspace opacities consistent with covid 19 infection.   PFTs:      Latest Ref Rng & Units 10/27/2017   11:49 AM  PFT Results  FVC-Pre L 1.24   FVC-Predicted Pre % 49   FVC-Post L 1.14   FVC-Predicted Post % 45   Pre FEV1/FVC % % 86   Post FEV1/FCV % % 87    FEV1-Pre L 1.07   FEV1-Predicted Pre % 54   FEV1-Post L 1.00   DLCO uncorrected ml/min/mmHg 9.77   DLCO UNC% % 40   DLVA Predicted % 116   TLC L 3.04   TLC % Predicted % 60   RV % Predicted % 62    I have personally reviewed the patient's PFTs and they show mild restriction to ventilation with reduced diffusion capacity.   Labs: covid 19 positive jan 2022  Immunization status: Immunization History  Administered Date(s) Administered   Fluad Quad(high Dose 65+) 04/10/2022   Influenza Whole 05/11/2014   Influenza, High Dose Seasonal PF 04/08/2018, 02/25/2019   Influenza,inj,Quad PF,6+ Mos 03/26/2015, 05/12/2016, 04/18/2017   Pneumococcal Conjugate-13 01/14/2015   Pneumococcal Polysaccharide-23 12/19/2013, 11/13/2019   Tdap 11/20/2016    Assessment:  Moderate Persistent Asthma, non TH2 mediated, stable Chronic Hypoxemic Respiratory Failure Suspected OHS/OSA, untreated Chronic HFpEF, compensated Paroxysmal atrial fibrillation on eliquis  Plan/Recommendations: continue symbicort 2 puffs twice a day continue albuterol as needed Still no sleep study or PFTs. I have instructed to her to schedule PFTs on her way out today.  I think she is not wanting to wear CPAP and thus avoiding and not scheduling her split night study.   Needs repeat ambulatory desat [facility] to [facility].    Return to Care: Return in about 3 months (around 02/16/2023) for next available PFT, follow up after.   Durel Salts, MD Pulmonary and Critical Care Medicine [facility] [phone]

## 2022-11-16 NOTE — Patient Instructions (Addendum)
Please schedule follow up scheduled with myself in 3 months.  If my schedule is not open yet, we will contact you with a reminder closer to that time. Please call [phone] if you haven't heard from [place] a month before.   Before your next visit I would like you to have: Full set of PFTs - please schedule these on your way out today.    Please let me know if you change your mind about sleep apnea. I can re-order the sleep study.   For asthma: continue symbicort 2 puffs twice a day continue albuterol as needed

## 2022-11-19 ENCOUNTER — Telehealth: Payer: Self-pay | Admitting: Primary Care

## 2022-11-19 ENCOUNTER — Other Ambulatory Visit (HOSPITAL_COMMUNITY): Payer: Self-pay

## 2022-11-19 NOTE — Telephone Encounter (Signed)
There is another message opened on this patient.  I have spoken to [place] at [facility].  They have poc order and it will be processed.  I spoke to pt & made her aware.  Nothing further needed.

## 2022-11-19 NOTE — Telephone Encounter (Signed)
Spoke to pt & made her aware [facility] has the order and it will be processed.  Nothing further needed.

## 2022-11-19 NOTE — Telephone Encounter (Signed)
Patient called to inform the nurse or doctor that she has not received her POC yet.  She stated when she came to the office on Monday the order was placed and she said that when she called [facility] they did not have the order.  Please call patient to discuss at [phone]

## 2022-11-19 NOTE — Telephone Encounter (Signed)
Pt is requesting for [facility] to pick up the machine that was dropped off and bring the POC as the pt asked. She can not carry the huge tank up and down the stairs hence why she is requesting for a POC.

## 2022-11-19 NOTE — Telephone Encounter (Signed)
Called [facility] and spoke to [place].  She states she has order for the POC.  There was some confusion because SATS on pcc note doesn't state pt was walked with POC to qualify.  She is going to go ahead and send thru order thru to process and will let us know if there is an issue.  I tried to call the patient but had to leave a vm for her to call me.

## 2022-11-23 ENCOUNTER — Ambulatory Visit: Payer: Self-pay | Admitting: Licensed Clinical Social Worker

## 2022-11-24 NOTE — Patient Instructions (Signed)
Visit Information  Thank you for taking time to visit with me today. Please don't hesitate to contact me if I can be of assistance to you.   Following are the goals we discussed today:   Goals Addressed             This Visit's Progress    Provide Supportive Resources-Safe Housing   On track    Activities and task to complete in order to accomplish goals.   Review resources provided by [employer] preferred apartments to inquire about [employer] Homes through [facility] to inquire about safety of residence Inform LCSW and/or RNCM at [facility] if a Legal Aid referral is needed to assist with home needs Continue utilizing healthy coping skills to assist with stress management          Our next appointment is by telephone on 06/17 at 1 PM  Please call the care guide team at [phone] if you need to cancel or reschedule your appointment.   If you are experiencing a Mental Health or Behavioral Health Crisis or need someone to talk to, please call the Suicide and Crisis Lifeline: 988 call 911   The patient verbalized understanding of instructions, educational materials, and care plan provided today and DECLINED offer to receive copy of patient instructions, educational materials, and care plan.   Jenel Lucks, MSW, LCSW [facility] Care Management [place]  [facility] [email or web address] Phone [phone] 1:45 PM

## 2022-11-24 NOTE — Patient Outreach (Signed)
  Care Coordination   Follow Up Visit Note   11/24/2022 Name: Rabia Gormley MRN: [id number] DOB: 04-26-1951  Zerenity Dury is a 72 y.o. year old female who sees Marcine Matar, MD for primary care. I spoke with  [name] by phone today.  What matters to the patients health and wellness today?  Stress Management and Self Care    Goals Addressed             This Visit's Progress    Provide Supportive Resources-Safe Housing   On track    Activities and task to complete in order to accomplish goals.   Review resources provided by [employer] preferred apartments to inquire about units [employer] Homes through [facility] to inquire about safety of residence Inform LCSW and/or RNCM at [facility] if a Legal Aid referral is needed to assist with home needs Continue utilizing healthy coping skills to assist with stress management          SDOH assessments and interventions completed:  No     Care Coordination Interventions:  Yes, provided   Follow up plan: Follow up call scheduled for 2 weeks    Encounter Outcome:  Pt. Visit Completed   Jenel Lucks, MSW, LCSW [facility] Care Management [facility] Health  [facility] [email or web address] Phone [phone] 1:44 PM

## 2022-11-25 ENCOUNTER — Encounter: Payer: Self-pay | Admitting: Licensed Clinical Social Worker

## 2022-11-26 NOTE — Patient Outreach (Signed)
  Care Coordination   Follow Up Visit Note   11/25/2022 Name: Heidi [name] MRN: [id number] DOB: Nov 06, 1950  Heidi [name] is a 73 y.o. year old female who sees Marcine Matar, MD for primary care.   What matters to the patients health and wellness today?  Home Safety   SDOH assessments and interventions completed:  No     Care Coordination Interventions:  Yes, provided  Interventions Today    Flowsheet Row Most Recent Value  General Interventions   General Interventions Discussed/Reviewed Community Resources  [LCSW researching medical device information, per pt request]  Safety Interventions   Safety Discussed/Reviewed Home Safety  Home Safety Assistive Devices       Follow up plan: Follow up call scheduled for 1-2 weeks    Encounter Outcome:  Pt. Visit Completed   Heidi [name], MSW, LCSW [name] Care Management [facility] Health  [facility] [email or web address] Phone [phone] 5:21 PM

## 2022-11-27 ENCOUNTER — Ambulatory Visit: Payer: Medicare HMO | Admitting: Cardiology

## 2022-11-27 ENCOUNTER — Encounter: Payer: Self-pay | Admitting: Cardiology

## 2022-11-27 VITALS — BP 126/71 | HR 73 | Resp 14 | Ht 64.0 in | Wt 227.6 lb

## 2022-11-27 DIAGNOSIS — I480 Paroxysmal atrial fibrillation: Secondary | ICD-10-CM

## 2022-11-27 DIAGNOSIS — I10 Essential (primary) hypertension: Secondary | ICD-10-CM

## 2022-11-27 MED ORDER — APIXABAN 5 MG PO TABS: 5.0000 mg | ORAL_TABLET | Freq: Two times a day (BID) | ORAL | 3 refills | Status: DC

## 2022-11-27 MED ORDER — DILTIAZEM HCL ER COATED BEADS 120 MG PO CP24: ORAL_CAPSULE | ORAL | 3 refills | Status: DC

## 2022-11-27 MED ORDER — FUROSEMIDE 40 MG PO TABS: ORAL_TABLET | ORAL | 3 refills | Status: DC

## 2022-11-27 MED ORDER — ATENOLOL 25 MG PO TABS: 25.0000 mg | ORAL_TABLET | Freq: Every day | ORAL | 2 refills | Status: DC

## 2022-11-27 NOTE — Progress Notes (Signed)
Patient referred by Marcine Matar, MD for chest pain  Subjective:   Heidi [name], female    DOB: June 28, 1951, 72 y.o.   MRN: [id number]   Chief Complaint  Patient presents with   PAF (paroxysmal atrial fibrillation)    Follow-up    6 months    HPI  72 y.o. African American female with hypertension,  hyperlipidemia, type 2 DM with retinopathy, PAF, moderate persistent asthma, chronic hypoxic respiratory failure, CKD stage III, OSA, obesity, breast nodule, chronic migraines  Patient is doing well, denies any chest pain, worse than baseline exertional dyspnea, palpitations.    Current Outpatient Medications:    Accu-Chek Softclix Lancets lancets, Use as directed.  ICD E11.69, Disp: 100 each, Rfl: 12   acetaminophen (TYLENOL) 500 MG tablet, Take 500-1,000 mg by mouth every 6 (six) hours as needed (pain)., Disp: , Rfl:    albuterol (PROVENTIL) (2.5 MG/3ML) 0.083% nebulizer solution, Take 3 mLs (2.5 mg total) by nebulization every 6 (six) hours as needed for wheezing or shortness of breath., Disp: 75 mL, Rfl: 5   albuterol (VENTOLIN HFA) 108 (90 Base) MCG/ACT inhaler, Inhale 1-2 puffs into the lungs every 4 (four) hours as needed for wheezing or shortness of breath., Disp: 8 g, Rfl: 5   Alcohol Swabs (DROPSAFE ALCOHOL PREP) 70 % PADS, USE AS DIRECTED, Disp: 300 each, Rfl: 3   apixaban (ELIQUIS) 5 MG TABS tablet, Take 1 tablet (5 mg total) by mouth 2 (two) times daily., Disp: 60 tablet, Rfl: 3   atenolol (TENORMIN) 25 MG tablet, TAKE 1 TABLET(25 MG) BY MOUTH DAILY, Disp: 90 tablet, Rfl: 2   atorvastatin (LIPITOR) 40 MG tablet, Take 1 tablet (40 mg total) by mouth every evening., Disp: 90 tablet, Rfl: 2   benzonatate (TESSALON) 100 MG capsule, Take 1 capsule (100 mg total) by mouth every 8 (eight) hours., Disp: 21 capsule, Rfl: 0   Blood Glucose Calibration (ACCU-CHEK GUIDE CONTROL) LIQD, L1-L2 Control Solution. UAD, Disp: 1 each, Rfl: 1   Blood Glucose Monitoring Suppl  (ACCU-CHEK GUIDE) w/Device KIT, UAD, Disp: 1 kit, Rfl: 0   botulinum toxin Type A (BOTOX) 200 units injection, Provider to inject 155 units into the muscles of the head and neck every 12 weeks. Discard remainder. (Patient not taking: Reported on 11/16/2022), Disp: 1 each, Rfl: 3   budesonide-formoterol (SYMBICORT) 80-4.5 MCG/ACT inhaler, Inhale 2 puffs into the lungs in the morning and at bedtime., Disp: 1 each, Rfl: 5   diltiazem (CARDIZEM CD) 120 MG 24 hr capsule, TAKE 1 CAPSULE(120 MG) BY MOUTH DAILY, Disp: 90 capsule, Rfl: 3   empagliflozin (JARDIANCE) 10 MG TABS tablet, Take 1 tablet (10 mg total) by mouth daily., Disp: 30 tablet, Rfl: 3   furosemide (LASIX) 40 MG tablet, TAKE 1 TABLET BY MOUTH DAILY, Disp: 90 tablet, Rfl: 2   glucose blood (ACCU-CHEK GUIDE) test strip, Test blood sugars 3-4 times a day with meals and at bedtime. E11.69, Disp: 100 each, Rfl: 12   insulin lispro protamine-lispro (HUMALOG MIX 75/25) (75-25) 100 UNIT/ML SUSP injection, Inject 50 units in the morning and 40 units in the evening., Disp: 30 mL, Rfl: 0   Insulin Syringe-Needle U-100 (INSULIN SYRINGE 1CC/31GX5/16") 31G X 5/16" 1 ML MISC, Use to inject Humalog 75/25 mix twice daily. Dx: E11.69, Disp: 200 each, Rfl: 2   loratadine (CLARITIN) 10 MG tablet, Take 1 tablet (10 mg total) by mouth daily as needed for allergies., Disp: 90 tablet, Rfl: 0   losartan (  COZAAR) 50 MG tablet, TAKE 1 TABLET(50 MG) BY MOUTH DAILY, Disp: 90 tablet, Rfl: 1   montelukast (SINGULAIR) 10 MG tablet, Take 1 tablet (10 mg total) by mouth at bedtime., Disp: 90 tablet, Rfl: 2   Multiple Vitamin (MULTIVITAMIN WITH MINERALS) TABS tablet, Take 1 tablet by mouth in the morning. Centrum Silver, Disp: , Rfl:    nitroGLYCERIN (NITROSTAT) 0.3 MG SL tablet, DISSOLVE 1 TABLET UNDER THE  TONGUE EVERY 5 MINUTES AS NEEDED FOR CHEST PAIN. MAX OF 3 TABLETS IN 15 MINUTES. CALL 911 IF PAIN  PERSISTS. Strength: 0.3 mg, Disp: 200 tablet, Rfl: 2   OXYGEN, Inhale 2 L  into the lungs continuous., Disp: , Rfl:    pantoprazole (PROTONIX) 20 MG tablet, Take 1 tablet (20 mg total) by mouth daily., Disp: 30 tablet, Rfl: 6   Potassium Chloride ER 20 MEQ TBCR, Take 1 tablet (20 mEq total) by mouth daily., Disp: 90 tablet, Rfl: 1   Pseudoeph-Doxylamine-DM-APAP (NYQUIL PO), Take by mouth as needed., Disp: , Rfl:    tetrahydrozoline-zinc (VISINE-AC) 0.05-0.25 % ophthalmic solution, Place 1 drop into both eyes 3 (three) times daily as needed (dry / irritated eyes)., Disp: , Rfl:    tiZANidine (ZANAFLEX) 2 MG tablet, Take 1 tablet (2 mg total) by mouth 2 (two) times daily as needed for muscle spasms. Each prescription to last 1 mth., Disp: 40 tablet, Rfl: 1   topiramate (TOPAMAX) 25 MG tablet, Take 1 tablet (25 mg) with Topiramate 50 mg tablet by mouth twice daily for total dose of 75 mg twice daily., Disp: 180 tablet, Rfl: 1   topiramate (TOPAMAX) 50 MG tablet, Take 1 tablet (50 mg) with 25 mg by mouth twice daily for total dose of 75 mg twice daily., Disp: 180 tablet, Rfl: 1   Ubrogepant (UBRELVY) 100 MG TABS, Take 1 tablet onset migraine, may take another tablet if needed 2 hours later. (2 tab max/daily), Disp: 16 tablet, Rfl: 5  Cardiovascular and other pertinent studies:  EKG 11/27/2022: Sinus rhythm 72 bpm  Possible old anteroseptal infarct Low voltage   Mobile cardiac telemetry 10 days 03/16/2022 - 03/26/2022: Dominant rhythm: Sinus. HR 51-109 bpm. Avg HR 74 bpm, in sinus rhythm. 42% Afib burden, ventricular rate 65-152 bpm (avg of 99 bpm).  0 episodes of SVT. <1% isolated SVE, couplets. 0 episodes of VT. <1% isolated VE, no couplet/triplets. No SVT/VT/high grade AV block, sinus pause >3sec noted. 2 patient triggered events, correlated with Afib.  Coronary angiography 03/04/2022: LM: Normal LAD: Medial calcification, minimal luminal irregularities Lcx: Normal RCA: Normal   Mildly elevated LVEDP   Complex procedure due to severe tortuosity requiring the  use of long sheath and multiple catheters.   No obstructive CAD. Given nitrate responsive pain, consider using long acting nitrate, as it could help with coronary vasospasm or endothelial dysfunction.     Echocardiogram 03/03/2022:  1. Left ventricular ejection fraction, by estimation, is 60 to 65%. The  left ventricle has normal function. The left ventricle has no regional  wall motion abnormalities. There is moderate left ventricular hypertrophy.  Left ventricular diastolic  parameters were normal.   2. Right ventricular systolic function is normal. The right ventricular  size is normal.   3. The mitral valve is normal in structure. No evidence of mitral valve  regurgitation. No evidence of mitral stenosis.   4. The aortic valve is normal in structure. Aortic valve regurgitation is  not visualized. No aortic stenosis is present.   Comparison(s): Changes from  prior study are noted. LVH is increased from  mild to moderate. Tribial pericardial effusion is new.   CT Chest 11/24/2019: 1. No evidence for pulmonary embolus. 2. Cardiomegaly with hazy bilateral ground-glass airspace opacities primarily at the lung bases, suggestive of an atypical infectious process such as viral pneumonia. 3. Scattered bilateral breast nodules measuring up to approximately 8 mm on the left. Correlation with outpatient mammography is recommended. 4. Partially calcified 1.5 cm left-sided thyroid nodule. Follow-up with a nonemergent outpatient thyroid ultrasound is recommended for further evaluation if this has not already been performed.(Ref: J Am Coll Radiol. 2015 Feb;12(2): 143-50).   Recent labs: 09/21/2022: Glucose 128, BUN/Cr 13/1.15. EGFR 51. Na/K 146/4.1. Rest of the CMP normal Chol 125, TG 148, HDL 63, LDL 37  03/07/2022: Glucose 865, BUN/Cr 28/1.26. EGFR 46. Na/K 142/4.0. Rest of the CMP normal H/H 13/43. MCV 90. Platelets 229 Lipoprotein (a) 81  11/2021: HbA1C 9.9%    Review of Systems   Cardiovascular:  Positive for dyspnea on exertion (Chronic, stable). Negative for chest pain, leg swelling, palpitations and syncope.  Neurological:  Positive for light-headedness. Negative for dizziness.        Vitals:   11/27/22 1304  BP: 126/71  Pulse: 73  Resp: 14  SpO2: 95%    Body mass index is 39.07 kg/m. Filed Weights   11/27/22 1304  Weight: 227 lb 9.6 oz (103.2 kg)    Objective:   Physical Exam Vitals and nursing note reviewed.  Constitutional:      General: She is not in acute distress.    Comments: On supplemental oxygen  Neck:     Vascular: No JVD.  Cardiovascular:     Rate and Rhythm: Normal rate and regular rhythm.     Heart sounds: Normal heart sounds. No murmur heard. Pulmonary:     Effort: Pulmonary effort is normal.     Breath sounds: Normal breath sounds. No wheezing or rales.  Musculoskeletal:     Right lower leg: Edema (1+) present.     Left lower leg: Edema (1+) present.         Assessment & Recommendations:   72 y.o. African American female with hypertension,  hyperlipidemia, type 2 DM with retinopathy, PAF, moderate persistent asthma, chronic hypoxic respiratory failure, CKD stage III, OSA, obesity, breast nodule, chronic migraines  PAF:  Currently in sinus rhythm. No over symptoms of palpitations. Multiple risk factors. Rate control approach reasonable. Continue diltiazem, atenolol CHA2DS2VASc score 4, annual score risk 4.8%. Continue Eliquis 5 mg bid.   Hypertension:  Controlled    Elder Negus, MD Pager: [phone] Office: [phone]

## 2022-12-01 ENCOUNTER — Other Ambulatory Visit: Payer: Self-pay | Admitting: Internal Medicine

## 2022-12-04 ENCOUNTER — Telehealth: Payer: Self-pay | Admitting: Pharmacist

## 2022-12-04 NOTE — Progress Notes (Signed)
[facility] Customer service manager [facility])                                            [facility] Quality Pharmacy Team    12/04/2022  Joscelin Litke 1951/06/22 [id number]  Returned telephone outreach to Mrs. Gilda Crease regarding the status of the patient assistance case for Ubrelvy. Approval for the medication has been delayed due to [facility] requesting additional proof that patient is not a recipient of the Medicare Part D Extra Help Program (LIS). Informed patient that a request was made to the appeals team to review her case again on 12/01/2022. Informed patient that the prescription needed to be refaxed on 12/03/2022 and that a follow up call is scheduled for next week for further determination.  Patient informs me that she is completely out of her medication and unable to afford at this time. I have advised patient to check with her prescriber's office for samples or alternative treatments as the patient assistance process is experiencing significant and unexpected delays. Patient verbalized understanding.   Reynold Bowen, PharmD [facility]  [facility] Clinical Pharmacist Direct Dial: [phone]

## 2022-12-09 ENCOUNTER — Telehealth: Payer: Self-pay | Admitting: Pharmacy Technician

## 2022-12-09 ENCOUNTER — Telehealth: Payer: Self-pay | Admitting: Neurology

## 2022-12-09 DIAGNOSIS — G43009 Migraine without aura, not intractable, without status migrainosus: Secondary | ICD-10-CM

## 2022-12-09 DIAGNOSIS — Z5986 Financial insecurity: Secondary | ICD-10-CM

## 2022-12-09 DIAGNOSIS — G43709 Chronic migraine without aura, not intractable, without status migrainosus: Secondary | ICD-10-CM

## 2022-12-09 MED ORDER — UBRELVY 100 MG PO TABS: ORAL_TABLET | ORAL | 0 refills | Status: DC

## 2022-12-09 NOTE — Telephone Encounter (Signed)
I spoke with the patient. She has a current migraine. She is unable to get [name] refilled through patient assistance until 12/16/22, otherwise the out of pocket cost for the medication will be $300. I spoke with Toma Copier, RN and Dr. Lucia Gaskins about this patient. Dr. Lucia Gaskins will provide 7 samples for the patient in the meanwhile. They have been signed out.  I have informed the patient that samples will be available for her. She will come and pick the samples up from the office. She verbalized understanding/appreciation for the call.

## 2022-12-09 NOTE — Telephone Encounter (Signed)
Pt called wanting to speak to the RN or MD regarding her Migraine medication. Pt is out and pharmacy is out of stock and is needing advice on what she can do regarding her migraine and the pain that it is causing through her body.

## 2022-12-09 NOTE — Progress Notes (Signed)
[facility] Customer service manager [facility])                                            [facility] Quality Pharmacy Team    12/09/2022  Heidi [name] January 31, 1951 [id number]  Care coordination call placed to [employer] in regard to [facility] application.  Spoke to [facility] who informs patient is APPROVED 12/09/22-07/06/23. Initial medication shipment will be sent to patient's home automatically. Subsequent refills will need to be called into [employer] by the patient and the phone number to call is [phone].  Heidi [name] P. Heidi [name], CPhT [facility] [employer]  [phone]

## 2022-12-14 NOTE — Telephone Encounter (Signed)
We have received a fax from [facility] Assist stating patient has been approved for Ubrelvy 100 mg (16 count) assistance through 07/06/2023.  Patient will need to call [phone] for refills 2 weeks prior to needing refills. Approval letter sent to medical records for scanning.

## 2022-12-17 DIAGNOSIS — H04123 Dry eye syndrome of bilateral lacrimal glands: Secondary | ICD-10-CM | POA: Diagnosis not present

## 2022-12-17 DIAGNOSIS — E103511 Type 1 diabetes mellitus with proliferative diabetic retinopathy with macular edema, right eye: Secondary | ICD-10-CM | POA: Diagnosis not present

## 2022-12-17 DIAGNOSIS — E103492 Type 1 diabetes mellitus with severe nonproliferative diabetic retinopathy without macular edema, left eye: Secondary | ICD-10-CM | POA: Diagnosis not present

## 2022-12-17 DIAGNOSIS — H401112 Primary open-angle glaucoma, right eye, moderate stage: Secondary | ICD-10-CM | POA: Diagnosis not present

## 2022-12-21 ENCOUNTER — Ambulatory Visit: Payer: Self-pay | Admitting: Licensed Clinical Social Worker

## 2022-12-22 NOTE — Patient Outreach (Signed)
  Care Coordination   Follow Up Visit Note   12/21/2022 Name: Heidi [name] MRN: [id number] DOB: Jun 09, 1951  Heidi [name] is a 72 y.o. year old female who sees Heidi Matar, MD for primary care. I spoke with  Heidi [name] by phone today.  What matters to the patients health and wellness today?  Stress management    Goals Addressed             This Visit's Progress    Provide Supportive Resources-Safe Housing   On track    Activities and task to complete in order to accomplish goals.   Keep all upcoming appointments discussed today Continue with compliance of taking medication prescribed by Doctor Implement healthy coping skills discussed to assist with management of symptoms Continue working with [facility] care team to assist with goals identified          SDOH assessments and interventions completed:  No     Care Coordination Interventions:  Yes, provided  Interventions Today    Flowsheet Row Most Recent Value  Chronic Disease   Chronic disease during today's visit Hypertension (HTN), Congestive Heart Failure (CHF), Diabetes, Chronic Kidney Disease/End Stage Renal Disease (ESRD), Other  [Chronic pain, Depression, and Anxiety]  General Interventions   General Interventions Discussed/Reviewed General Interventions Reviewed  [Patient reports decrease in migraines. She and daughter has completed housing application and is awaiting to hear back regarding an available unit on the first floor.]  Mental Health Interventions   Mental Health Discussed/Reviewed Mental Health Reviewed, Coping Strategies, Anxiety, Depression  [Pt endorses stress due to daughter being hospitalized for approx three days due to chronic health conditions. Self-care strategies to assist with stress management discussed. Encouragement and validation provided]  Nutrition Interventions   Nutrition Discussed/Reviewed Nutrition Reviewed  Pharmacy Interventions   Pharmacy Dicussed/Reviewed  Pharmacy Topics Reviewed, Medication Adherence  [Patient has been approved for patient assistance for medication for the remainder of the year.]       Follow up plan: Follow up call scheduled for 4-6 weeks    Encounter Outcome:  Pt. Visit Completed   Jenel Lucks, MSW, LCSW [facility] Care Management [facility]  [facility] [email or web address] Phone [phone] 7:26 PM

## 2022-12-22 NOTE — Patient Instructions (Signed)
Visit Information  Thank you for taking time to visit with me today. Please don't hesitate to contact me if I can be of assistance to you.   Following are the goals we discussed today:   Goals Addressed             This Visit's Progress    Provide Supportive Resources-Safe Housing   On track    Activities and task to complete in order to accomplish goals.   Keep all upcoming appointments discussed today Continue with compliance of taking medication prescribed by Doctor Implement healthy coping skills discussed to assist with management of symptoms Continue working with [facility] care team to assist with goals identified          Our next appointment is by telephone on 08/01 at 1 PM  Please call the care guide team at [phone] if you need to cancel or reschedule your appointment.   If you are experiencing a Mental Health or Behavioral Health Crisis or need someone to talk to, please call the Suicide and Crisis Lifeline: 988 call 911   The patient verbalized understanding of instructions, educational materials, and care plan provided today and DECLINED offer to receive copy of patient instructions, educational materials, and care plan.   Jenel Lucks, MSW, LCSW [facility] Care Management   [facility] [email or web address] Phone [phone] 7:27 PM

## 2022-12-25 ENCOUNTER — Other Ambulatory Visit: Payer: Self-pay | Admitting: Internal Medicine

## 2022-12-25 NOTE — Telephone Encounter (Signed)
Requested Prescriptions  Pending Prescriptions Disp Refills   potassium chloride SA (KLOR-CON M) 20 MEQ tablet [Pharmacy Med Name: POTASSIUM CHLORIDE ER 20 MEQ Tablet Extended Release] 90 tablet 0    Sig: TAKE 1 TABLET EVERY DAY     Endocrinology:  Minerals - Potassium Supplementation Failed - 12/25/2022  4:39 AM      Failed - Cr in normal range and within 360 days    Creat  Date Value Ref Range Status  05/12/2016 1.14 (H) 0.50 - 0.99 mg/dL Final    Comment:      For patients > or = 71 years of age: The upper reference limit for Creatinine is approximately 13% higher for people identified as African-American.      Creatinine, Ser  Date Value Ref Range Status  09/21/2022 1.15 (H) 0.57 - 1.00 mg/dL Final   Creatinine, Urine  Date Value Ref Range Status  11/27/2019 146.00 mg/dL Final    Comment:    Performed at [facility], [address], [place], [place] [address]         Passed - K in normal range and within 360 days    Potassium  Date Value Ref Range Status  09/21/2022 4.1 3.5 - 5.2 mmol/L Final         Passed - Valid encounter within last 12 months    Recent Outpatient Visits           3 months ago Type 2 diabetes mellitus with morbid obesity [facility])   [facility] Marcine Matar, MD   8 months ago Hospital discharge follow-up   [facility] Jonah Blue B, MD   1 year ago Type 2 diabetes mellitus with morbid obesity [facility])   [facility] Marcine Matar, MD   1 year ago No-show for appointment   [facility] Jonah Blue B, MD   1 year ago Type 2 diabetes mellitus with morbid obesity [facility])   [place] [facility] [place] Marcine Matar, MD       Future Appointments             In 3 weeks Marcine Matar, MD [facility]   In 1 month Celine Mans, Olene Craven,  MD [facility] [place]   In 5 months Patwardhan, Anabel Bene, MD [facility]

## 2023-01-11 NOTE — Progress Notes (Deleted)
Subjective:   Heidi [name] is a 72 y.o. female who presents for Medicare Annual (Subsequent) preventive examination.  Visit Complete: {[phone]}  Patient Medicare AWV questionnaire was completed by the patient on ***; I have confirmed that all information answered by patient is correct and no changes since this date.  Review of Systems    ***       Objective:    There were no vitals filed for this visit. There is no height or weight on file to calculate BMI.     09/01/2022    1:14 PM 03/02/2022    3:56 PM 02/11/2022   12:19 AM 05/11/2021    6:04 PM 04/15/2021    7:55 AM 08/11/2020   11:47 PM 08/09/2020   10:55 PM  Advanced Directives  Does Patient Have a Medical Advance Directive? No No No No No  No  Would patient like information on creating a medical advance directive? No - Patient declined No - Patient declined No - Patient declined No - Patient declined No - Patient declined No - Patient declined     Current Medications (verified) Outpatient Encounter Medications as of 01/12/2023  Medication Sig   Accu-Chek Softclix Lancets lancets Use as directed.  ICD E11.69   acetaminophen (TYLENOL) 500 MG tablet Take 500-1,000 mg by mouth every 6 (six) hours as needed (pain).   albuterol (PROVENTIL) (2.5 MG/3ML) 0.083% nebulizer solution Take 3 mLs (2.5 mg total) by nebulization every 6 (six) hours as needed for wheezing or shortness of breath.   albuterol (VENTOLIN HFA) 108 (90 Base) MCG/ACT inhaler Inhale 1-2 puffs into the lungs every 4 (four) hours as needed for wheezing or shortness of breath.   Alcohol Swabs (DROPSAFE ALCOHOL PREP) 70 % PADS USE AS DIRECTED   apixaban (ELIQUIS) 5 MG TABS tablet Take 1 tablet (5 mg total) by mouth 2 (two) times daily.   atenolol (TENORMIN) 25 MG tablet Take 1 tablet (25 mg total) by mouth daily.   atorvastatin (LIPITOR) 40 MG tablet Take 1 tablet (40 mg total) by mouth every evening.   benzonatate (TESSALON) 100 MG capsule Take 1  capsule (100 mg total) by mouth every 8 (eight) hours.   Blood Glucose Calibration (ACCU-CHEK GUIDE CONTROL) LIQD L1-L2 Control Solution. UAD   Blood Glucose Monitoring Suppl (ACCU-CHEK GUIDE) w/Device KIT UAD   budesonide-formoterol (SYMBICORT) 80-4.5 MCG/ACT inhaler Inhale 2 puffs into the lungs in the morning and at bedtime.   diltiazem (CARDIZEM CD) 120 MG 24 hr capsule TAKE 1 CAPSULE(120 MG) BY MOUTH DAILY   empagliflozin (JARDIANCE) 10 MG TABS tablet Take 1 tablet (10 mg total) by mouth daily.   furosemide (LASIX) 40 MG tablet TAKE 1 TABLET BY MOUTH DAILY   glucose blood (ACCU-CHEK GUIDE) test strip Test blood sugars 3-4 times a day with meals and at bedtime. E11.69   insulin lispro protamine-lispro (HUMALOG MIX 75/25) (75-25) 100 UNIT/ML SUSP injection Inject 50 units in the morning and 40 units in the evening.   Insulin Syringe-Needle U-100 (INSULIN SYRINGE 1CC/31GX5/16") 31G X 5/16" 1 ML MISC Use to inject Humalog 75/25 mix twice daily. Dx: E11.69   loratadine (CLARITIN) 10 MG tablet Take 1 tablet (10 mg total) by mouth daily as needed for allergies.   losartan (COZAAR) 50 MG tablet TAKE 1 TABLET(50 MG) BY MOUTH DAILY   montelukast (SINGULAIR) 10 MG tablet Take 1 tablet (10 mg total) by mouth at bedtime.   Multiple Vitamin (MULTIVITAMIN WITH MINERALS) TABS tablet Take 1 tablet by  mouth in the morning. Centrum Silver   nitroGLYCERIN (NITROSTAT) 0.3 MG SL tablet DISSOLVE 1 TABLET UNDER THE  TONGUE EVERY 5 MINUTES AS NEEDED FOR CHEST PAIN. MAX OF 3 TABLETS IN 15 MINUTES. CALL 911 IF PAIN  PERSISTS. Strength: 0.3 mg   OXYGEN Inhale 3 L into the lungs continuous.   pantoprazole (PROTONIX) 20 MG tablet Take 1 tablet (20 mg total) by mouth daily.   Potassium Chloride ER 20 MEQ TBCR Take 1 tablet (20 mEq total) by mouth daily.   potassium chloride SA (KLOR-CON M) 20 MEQ tablet TAKE 1 TABLET EVERY DAY   Pseudoeph-Doxylamine-DM-APAP (NYQUIL PO) Take by mouth as needed.   tiZANidine (ZANAFLEX) 2 MG  tablet Take 1 tablet (2 mg total) by mouth 2 (two) times daily as needed for muscle spasms. Each prescription to last 1 mth.   topiramate (TOPAMAX) 25 MG tablet Take 1 tablet (25 mg) with Topiramate 50 mg tablet by mouth twice daily for total dose of 75 mg twice daily.   topiramate (TOPAMAX) 50 MG tablet Take 1 tablet (50 mg) with 25 mg by mouth twice daily for total dose of 75 mg twice daily.   Ubrogepant (UBRELVY) 100 MG TABS Take 1 tablet onset migraine, may take another tablet if needed 2 hours later. (2 tab max/daily)   No facility-administered encounter medications on file as of 01/12/2023.    Allergies (verified) Januvia [sitagliptin] and Tramadol   History: Past Medical History:  Diagnosis Date   Adenomatous colon polyp    Anemia    Anxiety    Asthma    CHF (congestive heart failure) (HCC)    COPD (chronic obstructive pulmonary disease) (HCC)    Diabetes mellitus without complication (HCC)    High cholesterol    Hypertension    Left knee injury    Ocular hypertension 08/12/2022   OD   Pneumonia    Sleep apnea    Supplemental oxygen dependent    2L [place]   Past Surgical History:  Procedure Laterality Date   ABDOMINAL HYSTERECTOMY     CATARACT EXTRACTION Left    CATARACT EXTRACTION     LT 01/2017, 02/2017 on RT   CESAREAN SECTION     COLONOSCOPY WITH PROPOFOL N/A 04/15/2021   Procedure: COLONOSCOPY WITH PROPOFOL;  Surgeon: Hilarie Fredrickson, MD;  Location: [facility];  Service: Endoscopy;  Laterality: N/A;  patient is on O2   JOINT REPLACEMENT Left    Plate in Ankle   LEFT HEART CATH AND CORONARY ANGIOGRAPHY N/A 03/04/2022   Procedure: LEFT HEART CATH AND CORONARY ANGIOGRAPHY;  Surgeon: Elder Negus, MD;  Location: [facility];  Service: Cardiovascular;  Laterality: N/A;   POLYPECTOMY  04/15/2021   Procedure: POLYPECTOMY;  Surgeon: Hilarie Fredrickson, MD;  Location: [facility];  Service: Endoscopy;;   Family History  Problem Relation Age of Onset   Colon  cancer Father    Diabetes type II Sister    Colon cancer Sister    Diabetes type II Brother    Migraines Neg Hx    Breast cancer Neg Hx    Social History   Socioeconomic History   Marital status: Widowed    Spouse name: Not on file   Number of children: 4   Years of education: Not on file   Highest education level: Not on file  Occupational History   Occupation: Disabled  Tobacco Use   Smoking status: Never    Passive exposure: Never   Smokeless tobacco:  Never  Vaping Use   Vaping Use: Never used  Substance and Sexual Activity   Alcohol use: No   Drug use: No   Sexual activity: Yes  Other Topics Concern   Not on file  Social History Narrative   Lives with daughter   Caffeine use: 1 cup coffee per day   Social Determinants of Health   Financial Resource Strain: Not on file  Food Insecurity: No Food Insecurity (09/02/2022)   Hunger Vital Sign    Worried About Running Out of Food in the Last Year: Never true    Ran Out of Food in the Last Year: Never true  Transportation Needs: No Transportation Needs (09/02/2022)   PRAPARE - Administrator, Civil Service (Medical): No    Lack of Transportation (Non-Medical): No  Physical Activity: Not on file  Stress: Not on file  Social Connections: Not on file    Tobacco Counseling Counseling given: Not Answered   Clinical Intake:                        Activities of Daily Living    03/04/2022    1:02 PM 03/03/2022   10:37 PM  In your present state of health, do you have any difficulty performing the following activities:  Hearing?  1  Vision?  1  Difficulty concentrating or making decisions?  1  Walking or climbing stairs?  1  Dressing or bathing?  1  Doing errands, shopping? 0 0    Patient Care Team: Marcine Matar, MD as PCP - General (Internal Medicine)  Indicate any recent Medical Services you may have received from other than [facility] providers in the past year (date may be  approximate).     Assessment:   This is a routine wellness examination for Heidi [name].  Hearing/Vision screen No results found.  Dietary issues and exercise activities discussed:     Goals Addressed   None    Depression Screen    09/21/2022    4:07 PM 11/07/2021    9:41 AM 03/07/2021   11:54 AM 01/30/2021   10:05 AM 08/30/2020    9:56 AM 05/14/2020    2:36 PM 11/13/2019    3:11 PM  PHQ 2/9 Scores  PHQ - 2 Score 1 1 2  0 0 1 0  PHQ- 9 Score 1 3  5        Fall Risk    09/21/2022    3:57 PM 04/10/2022    2:48 PM 11/07/2021    9:41 AM 03/07/2021   11:54 AM 01/30/2021   10:00 AM  Fall Risk   Falls in the past year? 0 1 0 1 1  Number falls in past yr: 0 1 0 1 0  Injury with Fall? 0 1 0 0 1  Comment     hallucinations from medications  Risk for fall due to : History of fall(s) Impaired balance/gait;History of fall(s)  Other (Comment)   Follow up  Falls evaluation completed       MEDICARE RISK AT HOME:   TIMED UP AND GO:  Was the test performed?  No    Cognitive Function:        Immunizations Immunization History  Administered Date(s) Administered   Fluad Quad(high Dose 65+) 04/10/2022   Influenza Whole 05/11/2014   Influenza, High Dose Seasonal PF 04/08/2018, 02/25/2019   Influenza,inj,Quad PF,6+ Mos 03/26/2015, 05/12/2016, 04/18/2017   Pneumococcal Conjugate-13 01/14/2015   Pneumococcal Polysaccharide-23 12/19/2013, 11/13/2019  Tdap 11/20/2016    TDAP status: Up to date  Pneumococcal vaccine status: Up to date  Covid-19 vaccine status: Information provided on how to obtain vaccines.   Qualifies for Shingles Vaccine? Yes   Zostavax completed No   Shingrix Completed?: No.    Education has been provided regarding the importance of this vaccine. Patient has been advised to call insurance company to determine out of pocket expense if they have not yet received this vaccine. Advised may also receive vaccine at local pharmacy or Health Dept. Verbalized acceptance and  understanding.  Screening Tests Health Maintenance  Topic Date Due   Medicare Annual Wellness (AWV)  Never done   COVID-19 Vaccine (1) Never done   Zoster Vaccines- Shingrix (1 of 2) Never done   DEXA SCAN  Never done   MAMMOGRAM  05/01/2022   FOOT EXAM  09/05/2022   INFLUENZA VACCINE  02/04/2023   HEMOGLOBIN A1C  03/24/2023   OPHTHALMOLOGY EXAM  08/19/2023   Diabetic kidney evaluation - eGFR measurement  09/21/2023   Diabetic kidney evaluation - Urine ACR  09/21/2023   Colonoscopy  04/15/2024   DTaP/Tdap/Td (2 - Td or Tdap) 11/21/2026   Pneumonia Vaccine 74+ Years old  Completed   Hepatitis C Screening  Completed   HPV VACCINES  Aged Out    Health Maintenance  Health Maintenance Due  Topic Date Due   Medicare Annual Wellness (AWV)  Never done   COVID-19 Vaccine (1) Never done   Zoster Vaccines- Shingrix (1 of 2) Never done   DEXA SCAN  Never done   MAMMOGRAM  05/01/2022   FOOT EXAM  09/05/2022    Colorectal cancer screening: Type of screening: Colonoscopy. Completed 04/15/21. Repeat every 3 years  {Mammogram status:[id number]}  {Bone Density status:21018021}  Lung Cancer Screening: (Low Dose CT Chest recommended if Age 48-80 years, 20 pack-year currently smoking OR have quit w/in 15years.) does not qualify.   Lung Cancer Screening Referral: n/a  Additional Screening:  Hepatitis C Screening: does qualify; Completed 11/14/15  Vision Screening: Recommended annual ophthalmology exams for early detection of glaucoma and other disorders of the eye. Is the patient up to date with their annual eye exam?  {YES/NO:21197} Who is the provider or what is the name of the office in which the patient attends annual eye exams? *** If pt is not established with a provider, would they like to be referred to a provider to establish care? {YES/NO:21197}.   Dental Screening: Recommended annual dental exams for proper oral hygiene  Diabetic Foot Exam: Diabetic Foot Exam: Overdue, Pt  has been advised about the importance in completing this exam. Pt is scheduled for diabetic foot exam on at next office visit.  Community Resource Referral / Chronic Care Management: CRR required this visit?  {YES/NO:21197}  CCM required this visit?  {CCM Required [phone]}     Plan:     I have personally reviewed and noted the following in the patient's chart:   Medical and social history Use of alcohol, tobacco or illicit drugs  Current medications and supplements including opioid prescriptions. {Opioid Prescriptions:[phone]} Functional ability and status Nutritional status Physical activity Advanced directives List of other physicians Hospitalizations, surgeries, and ER visits in previous 12 months Vitals Screenings to include cognitive, depression, and falls Referrals and appointments  In addition, I have reviewed and discussed with patient certain preventive protocols, quality metrics, and best practice recommendations. A written personalized care plan for preventive services as well as general preventive health recommendations were  provided to patient.     Heidi [name], [place]   4/0/9811   After Visit Summary: {CHL AMB AWV After Visit Summary:[phone]}  Nurse Notes: ***

## 2023-01-12 ENCOUNTER — Ambulatory Visit: Payer: Medicare HMO | Attending: Internal Medicine

## 2023-01-13 ENCOUNTER — Ambulatory Visit: Payer: Medicare HMO

## 2023-01-13 DIAGNOSIS — Z1231 Encounter for screening mammogram for malignant neoplasm of breast: Secondary | ICD-10-CM | POA: Diagnosis not present

## 2023-01-13 LAB — HM MAMMOGRAPHY: HM Mammogram: NORMAL (ref 0–4)

## 2023-01-20 ENCOUNTER — Encounter: Payer: Self-pay | Admitting: Internal Medicine

## 2023-01-20 NOTE — Progress Notes (Signed)
Received mammogram report from [facility].  Exam done 01/13/2023.  Exam was normal.  Need to repeat in 1 year.

## 2023-01-21 ENCOUNTER — Encounter: Payer: Self-pay | Admitting: Internal Medicine

## 2023-01-21 ENCOUNTER — Ambulatory Visit: Payer: Medicare HMO | Attending: Internal Medicine | Admitting: Internal Medicine

## 2023-01-21 VITALS — BP 124/71 | HR 76 | Temp 98.0°F | Ht 64.0 in | Wt 226.0 lb

## 2023-01-21 DIAGNOSIS — I5032 Chronic diastolic (congestive) heart failure: Secondary | ICD-10-CM

## 2023-01-21 DIAGNOSIS — Z7984 Long term (current) use of oral hypoglycemic drugs: Secondary | ICD-10-CM

## 2023-01-21 DIAGNOSIS — M545 Low back pain, unspecified: Secondary | ICD-10-CM

## 2023-01-21 DIAGNOSIS — Z794 Long term (current) use of insulin: Secondary | ICD-10-CM

## 2023-01-21 DIAGNOSIS — E1169 Type 2 diabetes mellitus with other specified complication: Secondary | ICD-10-CM | POA: Diagnosis not present

## 2023-01-21 DIAGNOSIS — N1831 Chronic kidney disease, stage 3a: Secondary | ICD-10-CM | POA: Diagnosis not present

## 2023-01-21 DIAGNOSIS — E119 Type 2 diabetes mellitus without complications: Secondary | ICD-10-CM

## 2023-01-21 DIAGNOSIS — E669 Obesity, unspecified: Secondary | ICD-10-CM | POA: Diagnosis not present

## 2023-01-21 DIAGNOSIS — J4540 Moderate persistent asthma, uncomplicated: Secondary | ICD-10-CM | POA: Diagnosis not present

## 2023-01-21 DIAGNOSIS — I480 Paroxysmal atrial fibrillation: Secondary | ICD-10-CM | POA: Diagnosis not present

## 2023-01-21 DIAGNOSIS — Z6838 Body mass index (BMI) 38.0-38.9, adult: Secondary | ICD-10-CM

## 2023-01-21 LAB — POCT GLYCOSYLATED HEMOGLOBIN (HGB A1C): HbA1c, POC (controlled diabetic range): 9.1 % — AB (ref 0.0–7.0)

## 2023-01-21 LAB — GLUCOSE, POCT (MANUAL RESULT ENTRY): POC Glucose: 139 mg/dl — AB (ref 70–99)

## 2023-01-21 MED ORDER — TIZANIDINE HCL 2 MG PO TABS: 2.0000 mg | ORAL_TABLET | Freq: Every day | ORAL | 1 refills | Status: DC | PRN

## 2023-01-21 NOTE — Progress Notes (Signed)
Patient ID: Heidi [name], female    DOB: 09-03-1950  MRN: [id number]  CC: Diabetes (DM f/u. Libby Maw, congestion X1 week)   Subjective: Heidi [name] is a 72 y.o. female who presents for chronic ds management Her concerns today include:  history of HTN, DM 2 with retinopathy, HL, CKD 3, diastolic CHF, a. Fib (02/2022) hypoxic respiratory failure/cough variant asthma vs moderate persistent asthma 2 L O2 continuously, migraines [facility] on Botox inj), OSA (declines CPAP or repeat sleep study), UACS, GERD, OA knees,  DDD/spondylosis of cervical spine,obesity, renal cyst, LT thyroid nodule.   HTN/diastolic CHF/A.fib:  compliant with meds and limits salt Medications are atenolol 25 mg daily, Eliquis 5 mg twice a day, Cardizem 120 mg daily, furosemide 40 mg daily, Cozaar 50 mg daily, potassium 20 mEq daily and Jardiance 10 mg daily -no bruising/bleeding -some swelling in ankles today.  Reports she has been on her feet a lot. Also suspect due to dietary indiscretion. Sleeps on 1 pillow.  No PND.  -wgh stable We have been keeping an eye on her kidney function.  Last GFR 4 months ago was 51.  Asthma/COPD: C/o a little cough/congestion for last few days.  Taking Mucinex Using Symbicort BID as prescribed Uses Albuterol inhaler PRN but not daily,  uses mneb when she has flare Using her O2 continuously 2L.  Pox at home is 96-97 on 2L at rest; around 97 with ambulation. Saw Dr. Celine Mans 11/2022.  Suspect underlaying OHS/OSA. Pt avoiding sleep study because she does not want to be on CPAP.Marland Kitchen  DM: Results for orders placed or performed in visit on 01/21/23  POCT glucose (manual entry)  Result Value Ref Range   POC Glucose 139 (A) 70 - 99 mg/dl  POCT glycosylated hemoglobin (Hb A1C)  Result Value Ref Range   Hemoglobin A1C     HbA1c POC (<> result, manual entry)     HbA1c, POC (prediabetic range)     HbA1c, POC (controlled diabetic range) 9.1 (A) 0.0 - 7.0 %   *Note: Due to a large  number of results and/or encounters for the requested time period, some results have not been displayed. A complete set of results can be found in Results Review.  A1C increased; 8.6 on last visit.  Attributes increase to dietary indiscretion.  On Humalog 75/25 50 units in the morning, 40 units in the evening and Jardiance 10 mg Checks BS 3-4x/day before meals Has log with her.  Fasting BS before all 3 meals 87-140 with highest of 177 in past 3 wks.  Declines CGM on past visit and still not interested  Requesting a refill on tizanidine which she states she found helpful for her chronic back pain.  We had issues with her earlier in the year calling for refills on this medication before she was due so I held off on further refills.    HM:  due for Medicare Wellness Visit.  Still has rxn for shingles given on last visit.  Thinks she had shingles in past.  Patient Active Problem List   Diagnosis Date Noted   Severe persistent chronic asthma without complication 09/21/2022   Stage 3b chronic kidney disease (HCC) 09/21/2022   Chronic diastolic CHF (congestive heart failure) (HCC) 03/18/2022   PAF (paroxysmal atrial fibrillation) (HCC)    Unstable angina (HCC)    Chronic respiratory failure with hypoxia (HCC) 03/03/2022   Moderate persistent asthma 03/03/2022   Migraines 03/03/2022   Moderate persistent asthma without complication 09/04/2021  History of colonic polyps    Benign neoplasm of ascending colon    Benign neoplasm of transverse colon    Benign neoplasm of descending colon    Postural dizziness with presyncope 02/10/2021   Acute encephalopathy 01/27/2021   Fall at home, initial encounter 01/27/2021   Pneumonia due to COVID-19 virus 08/10/2020   Encephalopathy due to COVID-19 virus 08/10/2020   Acute kidney injury superimposed on CKD (HCC) 08/10/2020   COVID 08/10/2020   Encounter for medication review and counseling 03/18/2020   Medication management 02/28/2020   Exertional  dyspnea 01/03/2020   Breast nodule 11/24/2019   Chronic respiratory failure with hypoxia, on home oxygen therapy (HCC) 11/13/2019   Chest pain 08/24/2019   Diabetic retinopathy of right eye associated with type 2 diabetes mellitus (HCC) 08/16/2018   Upper airway cough syndrome 05/03/2018   Morbid obesity due to excess calories (HCC) complicated by hbp/ dm / hyperlipidemia 09/10/2017   Cough variant asthma vs UACS/vcd on ACEi  09/09/2017   Anxiety and depression 09/06/2017   Mild persistent asthma without complication 09/06/2017   Paranoia (HCC) 09/06/2017   Torn left ear lobe 11/20/2016   Heart failure with preserved ejection fraction (HCC) 06/27/2016   Post-menopausal atrophic vaginitis 11/14/2015   Primary osteoarthritis of both knees 07/18/2015   GERD (gastroesophageal reflux disease) 05/08/2015   Renal cyst 05/08/2015   Adenomatous polyp of colon 03/26/2015   Essential hypertension 02/12/2015   Cognitive deficit due to old head trauma 02/12/2015   Chronic left shoulder pain 02/11/2015   Thyroid nodule 12/18/2014   Chronic neck pain    HLD (hyperlipidemia)    OSA treated with BiPAP 11/27/2014   Obesity hypoventilation syndrome (HCC) 11/27/2014   Demand ischemia    Uncontrolled type 2 diabetes mellitus with hyperglycemia (HCC)    Cardiomegaly 11/25/2014   DM type 2 causing CKD stage 2 (HCC) 11/25/2014     Current Outpatient Medications on File Prior to Visit  Medication Sig Dispense Refill   Accu-Chek Softclix Lancets lancets Use as directed.  ICD E11.69 100 each 12   acetaminophen (TYLENOL) 500 MG tablet Take 500-1,000 mg by mouth every 6 (six) hours as needed (pain).     albuterol (PROVENTIL) (2.5 MG/3ML) 0.083% nebulizer solution Take 3 mLs (2.5 mg total) by nebulization every 6 (six) hours as needed for wheezing or shortness of breath. 75 mL 5   albuterol (VENTOLIN HFA) 108 (90 Base) MCG/ACT inhaler Inhale 1-2 puffs into the lungs every 4 (four) hours as needed for wheezing  or shortness of breath. 8 g 5   Alcohol Swabs (DROPSAFE ALCOHOL PREP) 70 % PADS USE AS DIRECTED 300 each 3   apixaban (ELIQUIS) 5 MG TABS tablet Take 1 tablet (5 mg total) by mouth 2 (two) times daily. 180 tablet 3   atenolol (TENORMIN) 25 MG tablet Take 1 tablet (25 mg total) by mouth daily. 90 tablet 2   atorvastatin (LIPITOR) 40 MG tablet Take 1 tablet (40 mg total) by mouth every evening. 90 tablet 2   Blood Glucose Calibration (ACCU-CHEK GUIDE CONTROL) LIQD L1-L2 Control Solution. UAD 1 each 1   Blood Glucose Monitoring Suppl (ACCU-CHEK GUIDE) w/Device KIT UAD 1 kit 0   budesonide-formoterol (SYMBICORT) 80-4.5 MCG/ACT inhaler Inhale 2 puffs into the lungs in the morning and at bedtime. 1 each 5   diltiazem (CARDIZEM CD) 120 MG 24 hr capsule TAKE 1 CAPSULE(120 MG) BY MOUTH DAILY 90 capsule 3   empagliflozin (JARDIANCE) 10 MG TABS tablet Take 1  tablet (10 mg total) by mouth daily. 30 tablet 3   furosemide (LASIX) 40 MG tablet TAKE 1 TABLET BY MOUTH DAILY 90 tablet 3   glucose blood (ACCU-CHEK GUIDE) test strip Test blood sugars 3-4 times a day with meals and at bedtime. E11.69 100 each 12   insulin lispro protamine-lispro (HUMALOG MIX 75/25) (75-25) 100 UNIT/ML SUSP injection Inject 50 units in the morning and 40 units in the evening. 30 mL 0   Insulin Syringe-Needle U-100 (INSULIN SYRINGE 1CC/31GX5/16") 31G X 5/16" 1 ML MISC Use to inject Humalog 75/25 mix twice daily. Dx: E11.69 200 each 2   loratadine (CLARITIN) 10 MG tablet Take 1 tablet (10 mg total) by mouth daily as needed for allergies. 90 tablet 0   losartan (COZAAR) 50 MG tablet TAKE 1 TABLET(50 MG) BY MOUTH DAILY 90 tablet 1   Multiple Vitamin (MULTIVITAMIN WITH MINERALS) TABS tablet Take 1 tablet by mouth in the morning. Centrum Silver     nitroGLYCERIN (NITROSTAT) 0.3 MG SL tablet DISSOLVE 1 TABLET UNDER THE  TONGUE EVERY 5 MINUTES AS NEEDED FOR CHEST PAIN. MAX OF 3 TABLETS IN 15 MINUTES. CALL 911 IF PAIN  PERSISTS. Strength: 0.3 mg  200 tablet 2   OXYGEN Inhale 3 L into the lungs continuous.     pantoprazole (PROTONIX) 20 MG tablet Take 1 tablet (20 mg total) by mouth daily. 30 tablet 6   Potassium Chloride ER 20 MEQ TBCR Take 1 tablet (20 mEq total) by mouth daily. 90 tablet 1   potassium chloride SA (KLOR-CON M) 20 MEQ tablet TAKE 1 TABLET EVERY DAY 90 tablet 0   Pseudoeph-Doxylamine-DM-APAP (NYQUIL PO) Take by mouth as needed.     tiZANidine (ZANAFLEX) 2 MG tablet Take 1 tablet (2 mg total) by mouth 2 (two) times daily as needed for muscle spasms. Each prescription to last 1 mth. 40 tablet 1   topiramate (TOPAMAX) 25 MG tablet Take 1 tablet (25 mg) with Topiramate 50 mg tablet by mouth twice daily for total dose of 75 mg twice daily. 180 tablet 1   topiramate (TOPAMAX) 50 MG tablet Take 1 tablet (50 mg) with 25 mg by mouth twice daily for total dose of 75 mg twice daily. 180 tablet 1   Ubrogepant (UBRELVY) 100 MG TABS Take 1 tablet onset migraine, may take another tablet if needed 2 hours later. (2 tab max/daily) 7 tablet 0   montelukast (SINGULAIR) 10 MG tablet Take 1 tablet (10 mg total) by mouth at bedtime. (Patient not taking: Reported on 01/21/2023) 90 tablet 2   No current facility-administered medications on file prior to visit.    Allergies  Allergen Reactions   Januvia [Sitagliptin]     Chest pains   Tramadol Other (See Comments)    hallucinations    Social History   Socioeconomic History   Marital status: Widowed    Spouse name: Not on file   Number of children: 4   Years of education: Not on file   Highest education level: Not on file  Occupational History   Occupation: Disabled  Tobacco Use   Smoking status: Never    Passive exposure: Never   Smokeless tobacco: Never  Vaping Use   Vaping status: Never Used  Substance and Sexual Activity   Alcohol use: No   Drug use: No   Sexual activity: Yes  Other Topics Concern   Not on file  Social History Narrative   Lives with daughter   Caffeine  use: 1 cup  coffee per day   Social Determinants of Health   Financial Resource Strain: Not on file  Food Insecurity: No Food Insecurity (09/02/2022)   Hunger Vital Sign    Worried About Running Out of Food in the Last Year: Never true    Ran Out of Food in the Last Year: Never true  Transportation Needs: No Transportation Needs (09/02/2022)   PRAPARE - Administrator, Civil Service (Medical): No    Lack of Transportation (Non-Medical): No  Physical Activity: Not on file  Stress: Not on file  Social Connections: Not on file  Intimate Partner Violence: Not on file    Family History  Problem Relation Age of Onset   Colon cancer Father    Diabetes type II Sister    Colon cancer Sister    Diabetes type II Brother    Migraines Neg Hx    Breast cancer Neg Hx     Past Surgical History:  Procedure Laterality Date   ABDOMINAL HYSTERECTOMY     CATARACT EXTRACTION Left    CATARACT EXTRACTION     LT 01/2017, 02/2017 on RT   CESAREAN SECTION     COLONOSCOPY WITH PROPOFOL N/A 04/15/2021   Procedure: COLONOSCOPY WITH PROPOFOL;  Surgeon: Hilarie Fredrickson, MD;  Location: [facility];  Service: Endoscopy;  Laterality: N/A;  patient is on O2   JOINT REPLACEMENT Left    Plate in Ankle   LEFT HEART CATH AND CORONARY ANGIOGRAPHY N/A 03/04/2022   Procedure: LEFT HEART CATH AND CORONARY ANGIOGRAPHY;  Surgeon: Elder Negus, MD;  [facility];  Service: Cardiovascular;  Laterality: N/A;   POLYPECTOMY  04/15/2021   Procedure: POLYPECTOMY;  Surgeon: Hilarie Fredrickson, MD;  Location: [facility];  Service: Endoscopy;;    ROS: Review of Systems Negative except as stated above  PHYSICAL EXAM: BP 124/71 (BP Location: Left Arm, Patient Position: Sitting, Cuff Size: Large)   Pulse 76   Temp 98 F (36.7 C) (Oral)   Ht 5\' 4"  (1.626 m)   Wt 226 lb (102.5 kg)   SpO2 92%   BMI 38.79 kg/m   Wt Readings from Last 3 Encounters:  01/21/23 226 lb (102.5 kg)  11/27/22 227 lb  9.6 oz (103.2 kg)  11/16/22 223 lb 3.2 oz (101.2 kg)    Physical Exam  General appearance - alert, well appearing, and in no distress Mental status - alert, oriented to person, place, and time Neck - supple, no significant adenopathy Chest - clear to auscultation, no wheezes, rales or rhonchi, symmetric air entry Heart - normal rate, regular rhythm, normal S1, S2, no murmurs, rubs, clicks or gallops Extremities - peripheral pulses normal.  Mild pedal edema.      Latest Ref Rng & Units 09/21/2022    5:19 PM 03/07/2022    4:06 AM 03/06/2022    4:53 AM  CMP  Glucose 70 - 99 mg/dL 253  664  79   BUN 8 - 27 mg/dL 13  28  24    Creatinine 0.57 - 1.00 mg/dL 4.03  4.74  2.59   Sodium 134 - 144 mmol/L 146  142  143   Potassium 3.5 - 5.2 mmol/L 4.1  4.0  4.1   Chloride 96 - 106 mmol/L 107  100  105   CO2 20 - 29 mmol/L 24  31  29    Calcium 8.7 - 10.3 mg/dL 9.6  9.0  9.1   Total Protein 6.0 - 8.5 g/dL 7.0  Total Bilirubin 0.0 - 1.2 mg/dL <1.6     Alkaline Phos 44 - 121 IU/L 116     AST 0 - 40 IU/L 20     ALT 0 - 32 IU/L 17      Lipid Panel     Component Value Date/Time   CHOL 125 09/21/2022 1719   TRIG 148 09/21/2022 1719   HDL 63 09/21/2022 1719   CHOLHDL 2.0 09/21/2022 1719   CHOLHDL 2.6 01/28/2021 0523   VLDL 30 01/28/2021 0523   LDLCALC 37 09/21/2022 1719    CBC    Component Value Date/Time   WBC 8.9 03/07/2022 0239   RBC 4.76 03/07/2022 0239   HGB 13.4 03/07/2022 0239   HGB 13.3 08/30/2020 1141   HCT 43.0 03/07/2022 0239   HCT 41.9 08/30/2020 1141   PLT 229 03/07/2022 0239   PLT 128 (L) 08/30/2020 1141   MCV 90.3 03/07/2022 0239   MCV 88 08/30/2020 1141   MCH 28.2 03/07/2022 0239   MCHC 31.2 03/07/2022 0239   RDW 15.3 03/07/2022 0239   RDW 15.2 08/30/2020 1141   LYMPHSABS 1.7 03/03/2022 0551   LYMPHSABS 4.3 (H) 08/23/2017 1655   MONOABS 0.3 03/03/2022 0551   EOSABS 0.2 03/03/2022 0551   EOSABS 0.4 08/23/2017 1655   BASOSABS 0.0 03/03/2022 0551   BASOSABS  0.1 08/23/2017 1655    ASSESSMENT AND PLAN:  1. Type 2 diabetes mellitus with obesity (HCC) 2. Insulin long-term use (HCC) 3. Diabetes mellitus treated with oral medication (HCC) -Not at goal based on A1c.  However blood sugar readings that she had recorded in her logbook are at goal.  Because blood sugars that are logged and not corresponding with her current A1c level done by fingerstick, we will do an A1c with blood draw to confirm.  Discussed and encourage healthy eating habits.  Patient is still not interested in a CGM -If indeed her A1c is at 9, I recommend trying her with Trulicity or Ozempic.  However patient was not interested in this stating that she knows what she needs to do to get her A1c down.  Other option would be to increase the Jardiance from 10 mg daily to 25 mg daily.  She would be agreeable to this.  I will wait and see what her labs look like first. - POCT glucose (manual entry) - POCT glycosylated hemoglobin (Hb A1C) - Hemoglobin A1c - Basic Metabolic Panel   4. Chronic diastolic heart failure (HCC) Stable.  She has mild edema in the feet.  DASH diet encouraged.  Continue furosemide and Jardiance.   5. PAF (paroxysmal atrial fibrillation) (HCC) Continue Eliquis.  6. Moderate persistent asthma without complication She will continue Symbicort inhaler  7. CKD stage 3a, GFR 45-59 ml/min (HCC) Recheck chemistry today.  Avoid NSAIDs.  8. Bilateral low back pain, unspecified chronicity, unspecified whether sciatica present I have done a refill on tizanidine to take once daily as needed.  Advised patient that if we run into issues with her calling early for refills, I will stop prescribing it altogether.  Advised not to share medication with her daughter. - tiZANidine (ZANAFLEX) 2 MG tablet; Take 1 tablet (2 mg total) by mouth daily as needed for muscle spasms. Each prescription to last 1 mth.  Dispense: 30 tablet; Refill: 1    Patient was given the opportunity to ask  questions.  Patient verbalized understanding of the plan and was able to repeat key elements of the plan.   This documentation  was completed using Paediatric nurse.  Any transcriptional errors are unintentional.  Orders Placed This Encounter  Procedures   POCT glucose (manual entry)   POCT glycosylated hemoglobin (Hb A1C)     Requested Prescriptions    No prescriptions requested or ordered in this encounter    No follow-ups on file.  Jonah Blue, MD, FACP

## 2023-01-22 LAB — BASIC METABOLIC PANEL
BUN/Creatinine Ratio: 13 (ref 12–28)
BUN: 14 mg/dL (ref 8–27)
CO2: 27 mmol/L (ref 20–29)
Calcium: 9.4 mg/dL (ref 8.7–10.3)
Chloride: 103 mmol/L (ref 96–106)
Creatinine, Ser: 1.09 mg/dL — ABNORMAL HIGH (ref 0.57–1.00)
Glucose: 101 mg/dL — ABNORMAL HIGH (ref 70–99)
Potassium: 4 mmol/L (ref 3.5–5.2)
Sodium: 145 mmol/L — ABNORMAL HIGH (ref 134–144)
eGFR: 55 mL/min/{1.73_m2} — ABNORMAL LOW (ref >59)

## 2023-01-22 LAB — HEMOGLOBIN A1C
Est. average glucose Bld gHb Est-mCnc: 223 mg/dL
Hgb A1c MFr Bld: 9.4 % — ABNORMAL HIGH (ref 4.8–5.6)

## 2023-01-22 NOTE — Progress Notes (Signed)
Let patient know that A1c by blood drawl was 9.4 confirming the A1c done by fingerstick yesterday. This means her diabetes is not well-controlled.  We will increase the Jardiance to 25 mg daily as discussed.  Please have her confirm where the prescription for the Jardiance needs to be sent.  - Once she starts taking the increased dose of Jardiance, she should cut the furosemide dose in half.  This means she will start taking half of the 40 mg pill.   -Kidney function is not at 100% but stable.  Sodium level mildly increased.  Please make sure that she is drinking several glasses of water daily.

## 2023-01-25 ENCOUNTER — Encounter: Payer: Self-pay | Admitting: Internal Medicine

## 2023-01-25 ENCOUNTER — Ambulatory Visit: Payer: Medicare HMO | Admitting: Internal Medicine

## 2023-01-25 VITALS — BP 122/78 | HR 69 | Ht 64.0 in | Wt 226.0 lb

## 2023-01-25 DIAGNOSIS — I480 Paroxysmal atrial fibrillation: Secondary | ICD-10-CM | POA: Diagnosis not present

## 2023-01-25 DIAGNOSIS — G4733 Obstructive sleep apnea (adult) (pediatric): Secondary | ICD-10-CM | POA: Diagnosis not present

## 2023-01-25 DIAGNOSIS — J9611 Chronic respiratory failure with hypoxia: Secondary | ICD-10-CM | POA: Diagnosis not present

## 2023-01-25 NOTE — Patient Instructions (Addendum)
Please schedule follow up scheduled with myself in 6 months.  If my schedule is not open yet, we will contact you with a reminder closer to that time. Please call [phone] if you haven't heard from [place] a month before.   Before your next visit I would like you to have:  PFTS  For your nasal drainage and coughing- you can try nasal saline spray over the counter. You can also take claritin and singulair. This should help the cough related to drainage.   If you get short of breath take the albuterol breathing treatments.   continue symbicort 2 puffs twice a day continue albuterol as needed

## 2023-01-25 NOTE — Progress Notes (Signed)
Heidi [name]    [id number]    Jul 23, 1950  Primary Care Physician:Johnson, Binnie Rail, MD Date of Appointment: 01/25/2023 Established Patient Visit  Chief complaint:   Chief Complaint  Patient presents with   Follow-up    Breathing is ok  O2 2l      HPI: Heidi [name] is a 72 y.o. woman with chronic hypercapnia and hypoxemia on BIPAP, asthma on advair, and covid 19 infection Feb 2022 with severe parenchymal lung injury on [facility] continuously. Start on oxygen August 2021. Also has Chronic HFpEF and is managed with diureitics and is on eliquis for A. Fib.   Interval Updates: Here for follow up. Breathing is doing ok.  Has not had sleep test. No-showed for PFTS.   She is having symptoms of nausea, post nasal drainage, sinus congestion and coughing. Asking if she needs and antibiotic.   DME - [facility].  Current Regimen: symbicort 2 puffs twice a day, albuterol  Asthma Triggers: exercise, cold weather Exacerbations in the last year: 1 since October 2024.  History of hospitalization or intubation: yes, not intubation Allergy Testing: none GERD: denies Allergic Rhinitis: denies ACT:  Asthma Control Test ACT Total Score  01/25/2023  1:17 PM 20  11/16/2022  9:27 AM 18  10/09/2021 11:27 AM 15   FeNO: 8 ppb   I have reviewed the patient's family social and past medical history and updated as appropriate.   Past Medical History:  Diagnosis Date   Adenomatous colon polyp    Anemia    Anxiety    Asthma    CHF (congestive heart failure) (HCC)    COPD (chronic obstructive pulmonary disease) (HCC)    Diabetes mellitus without complication (HCC)    High cholesterol    Hypertension    Left knee injury    Ocular hypertension 08/12/2022   OD   Pneumonia    Sleep apnea    Supplemental oxygen dependent    2L [place]    Past Surgical History:  Procedure Laterality Date   ABDOMINAL HYSTERECTOMY     CATARACT EXTRACTION Left    CATARACT EXTRACTION     LT 01/2017,  02/2017 on RT   CESAREAN SECTION     COLONOSCOPY WITH PROPOFOL N/A 04/15/2021   Procedure: COLONOSCOPY WITH PROPOFOL;  Surgeon: Hilarie Fredrickson, MD;  Location: [facility];  Service: Endoscopy;  Laterality: N/A;  patient is on O2   JOINT REPLACEMENT Left    Plate in Ankle   LEFT HEART CATH AND CORONARY ANGIOGRAPHY N/A 03/04/2022   Procedure: LEFT HEART CATH AND CORONARY ANGIOGRAPHY;  Surgeon: Elder Negus, MD;  [facility];  Service: Cardiovascular;  Laterality: N/A;   POLYPECTOMY  04/15/2021   Procedure: POLYPECTOMY;  Surgeon: Hilarie Fredrickson, MD;  Location: [facility];  Service: Endoscopy;;    Family History  Problem Relation Age of Onset   Colon cancer Father    Diabetes type II Sister    Colon cancer Sister    Diabetes type II Brother    Migraines Neg Hx    Breast cancer Neg Hx     Social History   Occupational History   Occupation: Disabled  Tobacco Use   Smoking status: Never    Passive exposure: Never   Smokeless tobacco: Never  Vaping Use   Vaping status: Never Used  Substance and Sexual Activity   Alcohol use: No   Drug use: No   Sexual activity: Yes  Physical Exam: Blood pressure 122/78, pulse 69, height 5\' 4"  (1.626 m), weight 226 lb (102.5 kg), SpO2 95%.  Gen:      No distress Lungs:  no wheezes or crackles, no increased wob.  CV:      RRR no edema   Data Reviewed: Imaging: I have personally reviewed the CT Chest Feb 2022 and it shows patchy multifocal airspace opacities consistent with covid 19 infection.   PFTs:      Latest Ref Rng & Units 10/27/2017   11:49 AM  PFT Results  FVC-Pre L 1.24   FVC-Predicted Pre % 49   FVC-Post L 1.14   FVC-Predicted Post % 45   Pre FEV1/FVC % % 86   Post FEV1/FCV % % 87   FEV1-Pre L 1.07   FEV1-Predicted Pre % 54   FEV1-Post L 1.00   DLCO uncorrected ml/min/mmHg 9.77   DLCO UNC% % 40   DLVA Predicted % 116   TLC L 3.04   TLC % Predicted % 60   RV % Predicted % 62    I have  personally reviewed the patient's PFTs and they show mild restriction to ventilation with reduced diffusion capacity.   Labs: covid 19 positive jan 2022  Immunization status: Immunization History  Administered Date(s) Administered   Fluad Quad(high Dose 65+) 04/10/2022   Influenza Whole 05/11/2014   Influenza, High Dose Seasonal PF 04/08/2018, 02/25/2019   Influenza,inj,Quad PF,6+ Mos 03/26/2015, 05/12/2016, 04/18/2017   Pneumococcal Conjugate-13 01/14/2015   Pneumococcal Polysaccharide-23 12/19/2013, 11/13/2019   Tdap 11/20/2016    Assessment:  Moderate Persistent Asthma, non TH2 mediated, stable Chronic Hypoxemic Respiratory Failure Suspected OHS/OSA, untreated Chronic HFpEF, compensated Paroxysmal atrial fibrillation on eliquis  Plan/Recommendations: Continue symbicort 2 puffs twice a day Continue albuterol as needed Recommend claritin and singulair for coughing related to post nasal drainage.  Still no sleep study or PFTs.  Can try again for PFTS in the future.   Return to Care: Return in about 6 months (around 07/28/2023).   Durel Salts, MD Pulmonary and Critical Care Medicine [facility] [phone]

## 2023-01-26 ENCOUNTER — Telehealth: Payer: Self-pay | Admitting: Internal Medicine

## 2023-01-26 DIAGNOSIS — J4550 Severe persistent asthma, uncomplicated: Secondary | ICD-10-CM

## 2023-01-26 NOTE — Telephone Encounter (Signed)
When PT saw Dr. Celine Mans she was told we would call in montelukast (SINGULAIR) 10 MG tablet [[id number]]  to [facility] (not her usual on line Pharm.) [facility] does not have it. Pls call PT to advise @ [phone]  [facility] on [address].

## 2023-01-27 MED ORDER — MONTELUKAST SODIUM 10 MG PO TABS: 10.0000 mg | ORAL_TABLET | Freq: Every day | ORAL | 2 refills | Status: DC

## 2023-01-27 NOTE — Telephone Encounter (Signed)
I have sent in the prescription and notified the patient.  Nothing further needed. 

## 2023-02-02 ENCOUNTER — Other Ambulatory Visit: Payer: Self-pay

## 2023-02-04 ENCOUNTER — Telehealth: Payer: Self-pay | Admitting: Internal Medicine

## 2023-02-04 ENCOUNTER — Ambulatory Visit: Payer: Self-pay | Admitting: Licensed Clinical Social Worker

## 2023-02-05 NOTE — Patient Instructions (Signed)
Visit Information  Thank you for taking time to visit with me today. Please don't hesitate to contact me if I can be of assistance to you.   Following are the goals we discussed today:   Goals Addressed             This Visit's Progress    Provide Supportive Resources-Safe Housing   On track    Activities and task to complete in order to accomplish goals.   Keep all upcoming appointments discussed today Continue with compliance of taking medication prescribed by Doctor Implement healthy coping skills discussed to assist with management of symptoms Continue working with [facility] care team to assist with goals identified          Our next appointment is by telephone on 08/29 at 3  Please call the care guide team at [phone] if you need to cancel or reschedule your appointment.   If you are experiencing a Mental Health or Behavioral Health Crisis or need someone to talk to, please call the Suicide and Crisis Lifeline: 988 call 911   The patient verbalized understanding of instructions, educational materials, and care plan provided today and DECLINED offer to receive copy of patient instructions, educational materials, and care plan.   Jenel Lucks, MSW, LCSW [facility] Care Management [place]  [facility] [email or web address] Phone [phone] 6:46 AM

## 2023-02-05 NOTE — Patient Outreach (Signed)
  Care Coordination   Follow Up Visit Note   02/04/2023 Name: Heidi [name] MRN: [id number] DOB: 1951-05-07  Heidi [name] is a 72 y.o. year old female who sees Marcine Matar, MD for primary care. I spoke with  [name] by phone today.  What matters to the patients health and wellness today?  Medications, Stress management, Grief, Symptom Management    Goals Addressed             This Visit's Progress    Provide Supportive Resources-Safe Housing   On track    Activities and task to complete in order to accomplish goals.   Keep all upcoming appointments discussed today Continue with compliance of taking medication prescribed by Doctor Implement healthy coping skills discussed to assist with management of symptoms Continue working with [facility] care team to assist with goals identified          SDOH assessments and interventions completed:  No     Care Coordination Interventions:  Yes, provided  Interventions Today    Flowsheet Row Most Recent Value  Chronic Disease   Chronic disease during today's visit Diabetes, Atrial Fibrillation (AFib), Congestive Heart Failure (CHF), Chronic Kidney Disease/End Stage Renal Disease (ESRD)  General Interventions   General Interventions Discussed/Reviewed General Interventions Reviewed, Doctor Visits, Community Resources  Doctor Visits Discussed/Reviewed Doctor Visits Reviewed  Mental Health Interventions   Mental Health Discussed/Reviewed Mental Health Reviewed, Coping Strategies, Anxiety, Depression, Grief and Loss  Nutrition Interventions   Nutrition Discussed/Reviewed Nutrition Reviewed, Decreasing sugar intake, Portion sizes  Pharmacy Interventions   Pharmacy Dicussed/Reviewed Pharmacy Topics Reviewed, Medication Adherence  Safety Interventions   Safety Discussed/Reviewed Safety Reviewed       Follow up plan: Follow up call scheduled for 4 weeks    Encounter Outcome:  Pt. Visit Completed  Jenel Lucks,  MSW, LCSW [facility] Care Management [facility]  [facility] [email or web address] Phone [phone] 6:45 AM

## 2023-02-22 DIAGNOSIS — H04123 Dry eye syndrome of bilateral lacrimal glands: Secondary | ICD-10-CM | POA: Diagnosis not present

## 2023-02-22 DIAGNOSIS — E103492 Type 1 diabetes mellitus with severe nonproliferative diabetic retinopathy without macular edema, left eye: Secondary | ICD-10-CM | POA: Diagnosis not present

## 2023-02-22 DIAGNOSIS — E103511 Type 1 diabetes mellitus with proliferative diabetic retinopathy with macular edema, right eye: Secondary | ICD-10-CM | POA: Diagnosis not present

## 2023-02-22 DIAGNOSIS — H401112 Primary open-angle glaucoma, right eye, moderate stage: Secondary | ICD-10-CM | POA: Diagnosis not present

## 2023-02-22 LAB — HM DIABETES EYE EXAM

## 2023-03-03 ENCOUNTER — Other Ambulatory Visit: Payer: Self-pay | Admitting: Neurology

## 2023-03-04 ENCOUNTER — Encounter: Payer: Self-pay | Admitting: Licensed Clinical Social Worker

## 2023-03-05 ENCOUNTER — Other Ambulatory Visit: Payer: Self-pay

## 2023-03-07 ENCOUNTER — Other Ambulatory Visit: Payer: Self-pay | Admitting: Internal Medicine

## 2023-03-09 NOTE — Telephone Encounter (Signed)
Rx 12/25/22 #90-too soon Requested Prescriptions  Pending Prescriptions Disp Refills   potassium chloride SA (KLOR-CON M) 20 MEQ tablet [Pharmacy Med Name: POTASSIUM CHLORIDE ER 20 MEQ Tablet Extended Release] 90 tablet 3    Sig: TAKE 1 TABLET EVERY DAY     Endocrinology:  Minerals - Potassium Supplementation Failed - 03/07/2023  2:55 AM      Failed - Cr in normal range and within 360 days    Creat  Date Value Ref Range Status  05/12/2016 1.14 (H) 0.50 - 0.99 mg/dL Final    Comment:      For patients > or = 71 years of age: The upper reference limit for Creatinine is approximately 13% higher for people identified as African-American.      Creatinine, Ser  Date Value Ref Range Status  01/21/2023 1.09 (H) 0.57 - 1.00 mg/dL Final   Creatinine, Urine  Date Value Ref Range Status  11/27/2019 146.00 mg/dL Final    Comment:    Performed at [facility], [address], [place], [place] [address]         Passed - K in normal range and within 360 days    Potassium  Date Value Ref Range Status  01/21/2023 4.0 3.5 - 5.2 mmol/L Final         Passed - Valid encounter within last 12 months    Recent Outpatient Visits           1 month ago Type 2 diabetes mellitus with obesity [facility])   [place] [facility] Jonah Blue B, MD   5 months ago Type 2 diabetes mellitus with morbid obesity [facility])   [place] [facility] Marcine Matar, MD   11 months ago Hospital discharge follow-up   [facility] Jonah Blue B, MD   1 year ago Type 2 diabetes mellitus with morbid obesity [facility])   [place] [facility] Marcine Matar, MD   1 year ago No-show for appointment   [facility] Marcine Matar, MD       Future Appointments             In 2 months Patwardhan, Anabel Bene, MD [facility]   In 2 months Marcine Matar, MD [facility]

## 2023-03-16 ENCOUNTER — Other Ambulatory Visit: Payer: Self-pay

## 2023-03-16 MED ORDER — ACCU-CHEK GUIDE VI STRP: ORAL_STRIP | 12 refills | Status: DC

## 2023-03-19 ENCOUNTER — Telehealth: Payer: Self-pay | Admitting: *Deleted

## 2023-03-19 ENCOUNTER — Other Ambulatory Visit: Payer: Self-pay | Admitting: Internal Medicine

## 2023-03-19 NOTE — Progress Notes (Signed)
Care Coordination Note  03/19/2023 Name: Chauncy Dondlinger MRN: [id number] DOB: [birth year]  Mersadies Leak is a 72 y.o. year old female who is a primary care patient of Marcine Matar, MD and is actively engaged with the care management team. I reached out to [name] by phone today to assist with re-scheduling a follow up visit with the Licensed Clinical Social Worker  Follow up plan: Unsuccessful telephone outreach attempt made. A HIPAA compliant phone message was left for the patient providing contact information and requesting a return call.   [facility]  Care Coordination Care Guide  Direct Dial: [phone]

## 2023-03-19 NOTE — Telephone Encounter (Signed)
Medication Refill - Medication: Accu-Chek Softclix Lancets lancets [[id number]]   Has the patient contacted their pharmacy? Yes.    (Agent: If yes, when and what did the pharmacy advise?) Pharmacy is contacting for the refills   Preferred Pharmacy (with phone number or street name): [facility] - [place], [place] - [address]   Has the patient been seen for an appointment in the last year OR does the patient have an upcoming appointment? Yes.    Agent: Please be advised that RX refills may take up to 3 business days. We ask that you follow-up with your pharmacy.

## 2023-03-19 NOTE — Telephone Encounter (Signed)
Called [facility], spoke with pharmacy tech [place]. She states that patient is due for refill for the Softclix lancets.

## 2023-03-21 ENCOUNTER — Other Ambulatory Visit: Payer: Self-pay | Admitting: Internal Medicine

## 2023-03-21 DIAGNOSIS — M545 Low back pain, unspecified: Secondary | ICD-10-CM

## 2023-03-22 MED ORDER — ACCU-CHEK SOFTCLIX LANCETS MISC: 10 refills | Status: DC

## 2023-03-23 ENCOUNTER — Ambulatory Visit: Payer: Medicare HMO | Attending: Internal Medicine

## 2023-03-23 VITALS — Ht 64.0 in | Wt 226.0 lb

## 2023-03-23 DIAGNOSIS — Z0000 Encounter for general adult medical examination without abnormal findings: Secondary | ICD-10-CM

## 2023-03-23 NOTE — Patient Instructions (Signed)
Ms. Stith , Thank you for taking time to come for your Medicare Wellness Visit. I appreciate your ongoing commitment to your health goals. Please review the following plan we discussed and let me know if I can assist you in the future.   Referrals/Orders/Follow-Ups/Clinician Recommendations: Aim for 30 minutes of exercise or brisk walking, 6-8 glasses of water, and 5 servings of fruits and vegetables each day.  This is a list of the screening recommended for you and due dates:  Health Maintenance  Topic Date Due   COVID-19 Vaccine (1) Never done   Zoster (Shingles) Vaccine (1 of 2) Never done   Complete foot exam   09/05/2022   Flu Shot  02/04/2023   DEXA scan (bone density measurement)  03/22/2024*   Hemoglobin A1C  07/24/2023   Eye exam for diabetics  08/19/2023   Yearly kidney health urinalysis for diabetes  09/21/2023   Yearly kidney function blood test for diabetes  01/21/2024   Medicare Annual Wellness Visit  03/22/2024   Colon Cancer Screening  04/15/2024   Mammogram  01/12/2025   DTaP/Tdap/Td vaccine (2 - Td or Tdap) 11/21/2026   Pneumonia Vaccine  Completed   Hepatitis C Screening  Completed   HPV Vaccine  Aged Out  *Topic was postponed. The date shown is not the original due date.    Advanced directives: (ACP Link)Information on Advanced Care Planning can be found at [facility] [place] Advance Health Care Directives Advance Health Care Directives ([email or web address]   Next Medicare Annual Wellness Visit scheduled for next year: Yes

## 2023-03-23 NOTE — Progress Notes (Signed)
Subjective:   Heidi [name] is a 72 y.o. female who presents for an Initial Medicare Annual Wellness Visit.  Visit Complete: Virtual  I connected with  [name] on 03/23/23 by a audio enabled telemedicine application and verified that I am speaking with the correct person using two identifiers.  Patient Location: Home  Provider Location: Home Office  I discussed the limitations of evaluation and management by telemedicine. The patient expressed understanding and agreed to proceed.  Vital Signs: Because this visit was a virtual/telehealth visit, some criteria may be missing or patient reported. Any vitals not documented were not able to be obtained and vitals that have been documented are patient reported.   Cardiac Risk Factors include: advanced age (>28men, >57 women);diabetes mellitus;dyslipidemia;hypertension     Objective:    Today's Vitals   03/23/23 1248  Weight: 226 lb (102.5 kg)  Height: 5\' 4"  (1.626 m)   Body mass index is 38.79 kg/m.     03/23/2023    1:18 PM 09/01/2022    1:14 PM 03/02/2022    3:56 PM 02/11/2022   12:19 AM 05/11/2021    6:04 PM 04/15/2021    7:55 AM 08/11/2020   11:47 PM  Advanced Directives  Does Patient Have a Medical Advance Directive? No No No No No No   Would patient like information on creating a medical advance directive? Yes (MAU/Ambulatory/Procedural Areas - Information given) No - Patient declined No - Patient declined No - Patient declined No - Patient declined No - Patient declined No - Patient declined    Current Medications (verified) Outpatient Encounter Medications as of 03/23/2023  Medication Sig   Accu-Chek Softclix Lancets lancets Use as directed.  ICD E11.69   acetaminophen (TYLENOL) 500 MG tablet Take 500-1,000 mg by mouth every 6 (six) hours as needed (pain).   albuterol (PROVENTIL) (2.5 MG/3ML) 0.083% nebulizer solution Take 3 mLs (2.5 mg total) by nebulization every 6 (six) hours as needed for wheezing or  shortness of breath.   albuterol (VENTOLIN HFA) 108 (90 Base) MCG/ACT inhaler Inhale 1-2 puffs into the lungs every 4 (four) hours as needed for wheezing or shortness of breath.   Alcohol Swabs (DROPSAFE ALCOHOL PREP) 70 % PADS USE AS DIRECTED   apixaban (ELIQUIS) 5 MG TABS tablet Take 1 tablet (5 mg total) by mouth 2 (two) times daily.   atenolol (TENORMIN) 25 MG tablet Take 1 tablet (25 mg total) by mouth daily.   atorvastatin (LIPITOR) 40 MG tablet Take 1 tablet (40 mg total) by mouth every evening.   Blood Glucose Calibration (ACCU-CHEK GUIDE CONTROL) LIQD L1-L2 Control Solution. UAD   Blood Glucose Monitoring Suppl (ACCU-CHEK GUIDE) w/Device KIT UAD   budesonide-formoterol (SYMBICORT) 80-4.5 MCG/ACT inhaler Inhale 2 puffs into the lungs in the morning and at bedtime.   diltiazem (CARDIZEM CD) 120 MG 24 hr capsule TAKE 1 CAPSULE(120 MG) BY MOUTH DAILY   empagliflozin (JARDIANCE) 10 MG TABS tablet Take 1 tablet (10 mg total) by mouth daily.   furosemide (LASIX) 40 MG tablet TAKE 1 TABLET BY MOUTH DAILY   glucose blood (ACCU-CHEK GUIDE) test strip Test blood sugars 3-4 times a day with meals and at bedtime. E11.69   insulin lispro protamine-lispro (HUMALOG MIX 75/25) (75-25) 100 UNIT/ML SUSP injection Inject 50 units in the morning and 40 units in the evening.   Insulin Syringe-Needle U-100 (INSULIN SYRINGE 1CC/31GX5/16") 31G X 5/16" 1 ML MISC Use to inject Humalog 75/25 mix twice daily. Dx: E11.69  loratadine (CLARITIN) 10 MG tablet Take 1 tablet (10 mg total) by mouth daily as needed for allergies.   losartan (COZAAR) 50 MG tablet TAKE 1 TABLET(50 MG) BY MOUTH DAILY   montelukast (SINGULAIR) 10 MG tablet Take 1 tablet (10 mg total) by mouth at bedtime.   Multiple Vitamin (MULTIVITAMIN WITH MINERALS) TABS tablet Take 1 tablet by mouth in the morning. Centrum Silver   nitroGLYCERIN (NITROSTAT) 0.3 MG SL tablet DISSOLVE 1 TABLET UNDER THE  TONGUE EVERY 5 MINUTES AS NEEDED FOR CHEST PAIN. MAX OF  3 TABLETS IN 15 MINUTES. CALL 911 IF PAIN  PERSISTS. Strength: 0.3 mg   OXYGEN Inhale 3 L into the lungs continuous.   pantoprazole (PROTONIX) 20 MG tablet Take 1 tablet (20 mg total) by mouth daily.   Potassium Chloride ER 20 MEQ TBCR Take 1 tablet (20 mEq total) by mouth daily.   potassium chloride SA (KLOR-CON M) 20 MEQ tablet TAKE 1 TABLET EVERY DAY   Pseudoeph-Doxylamine-DM-APAP (NYQUIL PO) Take by mouth as needed.   tiZANidine (ZANAFLEX) 2 MG tablet Take 1 tablet (2 mg total) by mouth daily as needed for muscle spasms. Each prescription to last 1 mth.   topiramate (TOPAMAX) 25 MG tablet TAKE 1 TABLET (25 MG) WITH TOPIRAMATE 50 MG TABLET BY MOUTH TWICE DAILY FOR TOTAL DOSE OF 75 MG TWICE DAILY.   topiramate (TOPAMAX) 50 MG tablet TAKE 1 TABLET (50 MG) WITH 25 MG TABLET BY MOUTH TWICE DAILY FOR TOTAL DOSE OF 75 MG TWICE DAILY.   Ubrogepant (UBRELVY) 100 MG TABS Take 1 tablet onset migraine, may take another tablet if needed 2 hours later. (2 tab max/daily)   No facility-administered encounter medications on file as of 03/23/2023.    Allergies (verified) Januvia [sitagliptin] and Tramadol   History: Past Medical History:  Diagnosis Date   Adenomatous colon polyp    Anemia    Anxiety    Asthma    CHF (congestive heart failure) (HCC)    COPD (chronic obstructive pulmonary disease) (HCC)    Diabetes mellitus without complication (HCC)    High cholesterol    Hypertension    Left knee injury    Ocular hypertension 08/12/2022   OD   Pneumonia    Sleep apnea    Supplemental oxygen dependent    2L [place]   Past Surgical History:  Procedure Laterality Date   ABDOMINAL HYSTERECTOMY     CATARACT EXTRACTION Left    CATARACT EXTRACTION     LT 01/2017, 02/2017 on RT   CESAREAN SECTION     COLONOSCOPY WITH PROPOFOL N/A 04/15/2021   Procedure: COLONOSCOPY WITH PROPOFOL;  Surgeon: Hilarie Fredrickson, MD;  Location: [facility];  Service: Endoscopy;  Laterality: N/A;  patient is on O2   JOINT  REPLACEMENT Left    Plate in Ankle   LEFT HEART CATH AND CORONARY ANGIOGRAPHY N/A 03/04/2022   Procedure: LEFT HEART CATH AND CORONARY ANGIOGRAPHY;  Surgeon: Elder Negus, MD;  Location: [facility];  Service: Cardiovascular;  Laterality: N/A;   POLYPECTOMY  04/15/2021   Procedure: POLYPECTOMY;  Surgeon: Hilarie Fredrickson, MD;  Location: [facility];  Service: Endoscopy;;   Family History  Problem Relation Age of Onset   Colon cancer Father    Diabetes type II Sister    Colon cancer Sister    Diabetes type II Brother    Migraines Neg Hx    Breast cancer Neg Hx    Social History   Socioeconomic History  Marital status: Widowed    Spouse name: Not on file   Number of children: 4   Years of education: Not on file   Highest education level: Not on file  Occupational History   Occupation: Disabled  Tobacco Use   Smoking status: Never    Passive exposure: Never   Smokeless tobacco: Never  Vaping Use   Vaping status: Never Used  Substance and Sexual Activity   Alcohol use: No   Drug use: No   Sexual activity: Yes  Other Topics Concern   Not on file  Social History Narrative   Lives with daughter   Caffeine use: 1 cup coffee per day   Social Determinants of Health   Financial Resource Strain: Low Risk  (03/23/2023)   Overall Financial Resource Strain (CARDIA)    Difficulty of Paying Living Expenses: Not hard at all  Food Insecurity: No Food Insecurity (03/23/2023)   Hunger Vital Sign    Worried About Running Out of Food in the Last Year: Never true    Ran Out of Food in the Last Year: Never true  Transportation Needs: No Transportation Needs (03/23/2023)   PRAPARE - Administrator, Civil Service (Medical): No    Lack of Transportation (Non-Medical): No  Physical Activity: Insufficiently Active (03/23/2023)   Exercise Vital Sign    Days of Exercise per Week: 3 days    Minutes of Exercise per Session: 30 min  Stress: No Stress Concern Present  (03/23/2023)   [employer] of Occupational Health - Occupational Stress Questionnaire    Feeling of Stress : Not at all  Social Connections: Moderately Isolated (03/23/2023)   Social Connection and Isolation Panel [NHANES]    Frequency of Communication with Friends and Family: More than three times a week    Frequency of Social Gatherings with Friends and Family: Three times a week    Attends Religious Services: 1 to 4 times per year    Active Member of Clubs or Organizations: No    Attends Banker Meetings: Never    Marital Status: Widowed    Tobacco Counseling Counseling given: Not Answered   Clinical Intake:  Pre-visit preparation completed: Yes  Pain : No/denies pain     Diabetes: Yes CBG done?: No Did pt. bring in CBG monitor from home?: No  How often do you need to have someone help you when you read instructions, pamphlets, or other written materials from your doctor or pharmacy?: 1 - Never  Interpreter Needed?: No  Information entered by :: Kandis Fantasia LPN   Activities of Daily Living    03/23/2023    1:12 PM  In your present state of health, do you have any difficulty performing the following activities:  Hearing? 0  Vision? 0  Difficulty concentrating or making decisions? 0  Walking or climbing stairs? 0  Dressing or bathing? 0  Doing errands, shopping? 0  Preparing Food and eating ? N  Using the Toilet? N  In the past six months, have you accidently leaked urine? N  Do you have problems with loss of bowel control? N  Managing your Medications? N  Managing your Finances? N  Housekeeping or managing your Housekeeping? N    Patient Care Team: Marcine Matar, MD as PCP - General (Internal Medicine)  Indicate any recent Medical Services you may have received from other than [facility] providers in the past year (date may be approximate).     Assessment:   This is  a routine wellness examination for Ashaki.  Hearing/Vision  screen Hearing Screening - Comments:: Denies hearing difficulties   Vision Screening - Comments:: Wears rx glasses - up to date with routine eye exams with Dr. Luciana Axe     Goals Addressed             This Visit's Progress    COMPLETED: Provide Supportive Resources-Safe Housing       Activities and task to complete in order to accomplish goals.   Keep all upcoming appointments discussed today Continue with compliance of taking medication prescribed by Doctor Implement healthy coping skills discussed to assist with management of symptoms Continue working with [facility] care team to assist with goals identified       Remain active and independent        Depression Screen    03/23/2023    1:07 PM 01/21/2023    2:42 PM 09/21/2022    4:07 PM 11/07/2021    9:41 AM 03/07/2021   11:54 AM 01/30/2021   10:05 AM 08/30/2020    9:56 AM  PHQ 2/9 Scores  PHQ - 2 Score 0  1 1 2  0 0  PHQ- 9 Score   1 3  5    Exception Documentation  Patient refusal         Fall Risk    03/23/2023    1:12 PM 01/21/2023    2:34 PM 09/21/2022    3:57 PM 04/10/2022    2:48 PM 11/07/2021    9:41 AM  Fall Risk   Falls in the past year? 0 0 0 1 0  Number falls in past yr: 0 0 0 1 0  Injury with Fall? 0 0 0 1 0  Risk for fall due to : No Fall Risks No Fall Risks History of fall(s) Impaired balance/gait;History of fall(s)   Follow up Falls prevention discussed;Education provided;Falls evaluation completed   Falls evaluation completed     MEDICARE RISK AT HOME: Medicare Risk at Home Any stairs in or around the home?: No If so, are there any without handrails?: No Home free of loose throw rugs in walkways, pet beds, electrical cords, etc?: Yes Adequate lighting in your home to reduce risk of falls?: Yes Life alert?: No Use of a cane, walker or w/c?: No Grab bars in the bathroom?: Yes Shower chair or bench in shower?: No Elevated toilet seat or a handicapped toilet?: Yes  TIMED UP AND GO:  Was the test performed? No     Cognitive Function:        03/23/2023    1:18 PM  6CIT Screen  What Year? 0 points  What month? 0 points  What time? 0 points  Count back from 20 0 points  Months in reverse 2 points  Repeat phrase 0 points  Total Score 2 points    Immunizations Immunization History  Administered Date(s) Administered   Fluad Quad(high Dose 65+) 04/10/2022   Influenza Whole 05/11/2014   Influenza, High Dose Seasonal PF 04/08/2018, 02/25/2019   Influenza,inj,Quad PF,6+ Mos 03/26/2015, 05/12/2016, 04/18/2017   Pneumococcal Conjugate-13 01/14/2015   Pneumococcal Polysaccharide-23 12/19/2013, 11/13/2019   Tdap 11/20/2016    TDAP status: Up to date  Flu Vaccine status: Due, Education has been provided regarding the importance of this vaccine. Advised may receive this vaccine at local pharmacy or Health Dept. Aware to provide a copy of the vaccination record if obtained from local pharmacy or Health Dept. Verbalized acceptance and understanding.  Pneumococcal vaccine status: Up to  date  Covid-19 vaccine status: Information provided on how to obtain vaccines.   Qualifies for Shingles Vaccine? Yes   Zostavax completed No   Shingrix Completed?: No.    Education has been provided regarding the importance of this vaccine. Patient has been advised to call insurance company to determine out of pocket expense if they have not yet received this vaccine. Advised may also receive vaccine at local pharmacy or Health Dept. Verbalized acceptance and understanding.  Screening Tests Health Maintenance  Topic Date Due   COVID-19 Vaccine (1) Never done   Zoster Vaccines- Shingrix (1 of 2) Never done   FOOT EXAM  09/05/2022   INFLUENZA VACCINE  02/04/2023   DEXA SCAN  03/22/2024 (Originally 03/18/2017)   HEMOGLOBIN A1C  07/24/2023   OPHTHALMOLOGY EXAM  08/19/2023   Diabetic kidney evaluation - Urine ACR  09/21/2023   Diabetic kidney evaluation - eGFR measurement  01/21/2024   Medicare Annual Wellness  (AWV)  03/22/2024   Colonoscopy  04/15/2024   MAMMOGRAM  01/12/2025   DTaP/Tdap/Td (2 - Td or Tdap) 11/21/2026   Pneumonia Vaccine 19+ Years old  Completed   Hepatitis C Screening  Completed   HPV VACCINES  Aged Out    Health Maintenance  Health Maintenance Due  Topic Date Due   COVID-19 Vaccine (1) Never done   Zoster Vaccines- Shingrix (1 of 2) Never done   FOOT EXAM  09/05/2022   INFLUENZA VACCINE  02/04/2023    Colorectal cancer screening: Type of screening: Colonoscopy. Completed 04/15/21. Repeat every 5 years  Mammogram status: Completed 01/13/23. Repeat every year  Bone Density status:  Patient declines at this time   Lung Cancer Screening: (Low Dose CT Chest recommended if Age 40-80 years, 20 pack-year currently smoking OR have quit w/in 15years.) does not qualify.   Lung Cancer Screening Referral: n/a  Additional Screening:  Hepatitis C Screening: does qualify; Completed 11/14/15  Vision Screening: Recommended annual ophthalmology exams for early detection of glaucoma and other disorders of the eye. Is the patient up to date with their annual eye exam?  Yes  Who is the provider or what is the name of the office in which the patient attends annual eye exams? Dr. Luciana Axe If pt is not established with a provider, would they like to be referred to a provider to establish care? No .   Dental Screening: Recommended annual dental exams for proper oral hygiene  Diabetic Foot Exam: Diabetic Foot Exam: Overdue, Pt has been advised about the importance in completing this exam. Pt is scheduled for diabetic foot exam on at next office visit.  Community Resource Referral / Chronic Care Management: CRR required this visit?  No   CCM required this visit?  No     Plan:     I have personally reviewed and noted the following in the patient's chart:   Medical and social history Use of alcohol, tobacco or illicit drugs  Current medications and supplements including opioid  prescriptions. Patient is not currently taking opioid prescriptions. Functional ability and status Nutritional status Physical activity Advanced directives List of other physicians Hospitalizations, surgeries, and ER visits in previous 12 months Vitals Screenings to include cognitive, depression, and falls Referrals and appointments  In addition, I have reviewed and discussed with patient certain preventive protocols, quality metrics, and best practice recommendations. A written personalized care plan for preventive services as well as general preventive health recommendations were provided to patient.     Durwin Nora, LPN  03/23/2023   After Visit Summary: (Mail) Due to this being a telephonic visit, the after visit summary with patients personalized plan was offered to patient via mail   Nurse Notes: Patient is requesting a refill of Tizanidine.

## 2023-03-23 NOTE — Telephone Encounter (Signed)
Requested medication (s) are due for refill today: yes  Requested medication (s) are on the active medication list: yes  Last refill:  01/21/23  Future visit scheduled: yes  Notes to clinic:  Unable to refill per protocol, cannot delegate.      Requested Prescriptions  Pending Prescriptions Disp Refills   tiZANidine (ZANAFLEX) 2 MG tablet [Pharmacy Med Name: TIZANIDINE 2MG  TABLETS] 30 tablet 1    Sig: TAKE 1 TABLET BY MOUTH DAILY AS NEEDED FOR MUSCLE SPASM- MUST LAST ONE MONTH     Not Delegated - Cardiovascular:  Alpha-2 Agonists - tizanidine Failed - 03/21/2023 11:36 AM      Failed - This refill cannot be delegated      Passed - Valid encounter within last 6 months    Recent Outpatient Visits           2 months ago Type 2 diabetes mellitus with obesity (HCC)   [facility] Jonah Blue B, MD   6 months ago Type 2 diabetes mellitus with morbid obesity [facility])   [place] [facility] Marcine Matar, MD   11 months ago Hospital discharge follow-up   [facility] Jonah Blue B, MD   1 year ago Type 2 diabetes mellitus with morbid obesity [facility])    [facility] Marcine Matar, MD   1 year ago No-show for appointment   [facility] Marcine Matar, MD       Future Appointments             In 2 months Patwardhan, Anabel Bene, MD [facility]   In 2 months Marcine Matar, MD [facility]

## 2023-03-24 ENCOUNTER — Other Ambulatory Visit: Payer: Self-pay | Admitting: Internal Medicine

## 2023-03-24 MED ORDER — ACCU-CHEK SOFTCLIX LANCETS MISC: 10 refills | Status: DC

## 2023-03-29 NOTE — Progress Notes (Signed)
Care Coordination Note  03/29/2023 Name: Heidi [name] MRN: [id number] DOB: 09/26/1950  Heidi [name] is a 72 y.o. year old female who is a primary care patient of Marcine Matar, MD and is actively engaged with the care management team. I reached out to [name] by phone today to assist with scheduling an initial visit with the RN Case Manager and Licensed Clinical Social Worker  Follow up plan: Telephone appointment with care management team member scheduled for: 04/13/23 RNCM & 04/05/23 SW  [facility] Coordination Care Guide  Direct Dial: [phone]

## 2023-03-31 ENCOUNTER — Other Ambulatory Visit: Payer: Self-pay | Admitting: Internal Medicine

## 2023-03-31 DIAGNOSIS — I10 Essential (primary) hypertension: Secondary | ICD-10-CM

## 2023-04-05 ENCOUNTER — Ambulatory Visit: Payer: Self-pay | Admitting: Licensed Clinical Social Worker

## 2023-04-05 NOTE — Patient Outreach (Signed)
Care Coordination   Follow Up Visit Note   04/05/2023 Name: Heidi [name] MRN: [id number] DOB: Jan 22, 1951  Heidi [name] is a 72 y.o. year old female who sees Marcine Matar, MD for primary care. I spoke with  Heidi [name] by phone today.  What matters to the patients health and wellness today?  Symptom Management    Goals Addressed             This Visit's Progress    Obtain Supportive Resources   On track    Activities and task to complete in order to accomplish goals.   Keep all upcoming appointments discussed today Continue with compliance of taking medication prescribed by Doctor Implement healthy coping skills discussed to assist with management of symptoms Continue working with [facility] care team to assist with goals identified          SDOH assessments and interventions completed:  No     Care Coordination Interventions:  Yes, provided  Interventions Today    Flowsheet Row Most Recent Value  Chronic Disease   Chronic disease during today's visit Atrial Fibrillation (AFib), Congestive Heart Failure (CHF), Chronic Kidney Disease/End Stage Renal Disease (ESRD), Other  [Anxiety and Depression]  General Interventions   General Interventions Discussed/Reviewed General Interventions Reviewed, Doctor Visits  Doctor Visits Discussed/Reviewed Doctor Visits Reviewed  Mental Health Interventions   Mental Health Discussed/Reviewed Mental Health Reviewed, Coping Strategies  [Patient reports gratitude thinking after moving into a new residence with no stairs this month. Practicing healthy coping skills has promoted positive mood]  Nutrition Interventions   Nutrition Discussed/Reviewed Nutrition Reviewed  Pharmacy Interventions   Pharmacy Dicussed/Reviewed Pharmacy Topics Reviewed, Medication Adherence  Safety Interventions   Safety Discussed/Reviewed Safety Reviewed       Follow up plan: Follow up call scheduled for 4-6 weeks    Encounter Outcome:   Patient Visit Completed   Jenel Lucks, MSW, LCSW [facility] Care Management [facility]  [facility] [email or web address] Phone [phone] 4:02 PM

## 2023-04-05 NOTE — Patient Instructions (Signed)
Visit Information  Thank you for taking time to visit with me today. Please don't hesitate to contact me if I can be of assistance to you.   Following are the goals we discussed today:   Goals Addressed             This Visit's Progress    Obtain Supportive Resources   On track    Activities and task to complete in order to accomplish goals.   Keep all upcoming appointments discussed today Continue with compliance of taking medication prescribed by Doctor Implement healthy coping skills discussed to assist with management of symptoms Continue working with [facility] care team to assist with goals identified          Our next appointment is by telephone on 11/11 at 3 PM  Please call the care guide team at [phone] if you need to cancel or reschedule your appointment.   If you are experiencing a Mental Health or Behavioral Health Crisis or need someone to talk to, please call the Suicide and Crisis Lifeline: 988 call 911   The patient verbalized understanding of instructions, educational materials, and care plan provided today and DECLINED offer to receive copy of patient instructions, educational materials, and care plan.   Jenel Lucks, MSW, LCSW [facility] Care Management [place]  [facility] [email or web address] Phone [phone] 4:03 PM

## 2023-04-08 ENCOUNTER — Telehealth: Payer: Self-pay

## 2023-04-08 ENCOUNTER — Other Ambulatory Visit: Payer: Self-pay | Admitting: Internal Medicine

## 2023-04-08 DIAGNOSIS — E1169 Type 2 diabetes mellitus with other specified complication: Secondary | ICD-10-CM

## 2023-04-08 MED ORDER — "INSULIN SYRINGE 31G X 5/16"" 1 ML MISC"
6 refills | Status: DC
  Filled 2023-04-08: qty 100, 50d supply, fill #0
  Filled 2023-06-15: qty 100, 50d supply, fill #1

## 2023-04-08 NOTE — Telephone Encounter (Signed)
Copied from CRM [id number]. Topic: General - Inquiry >> Apr 08, 2023  1:27 PM Runell Gess P wrote: Reason for CRM: Pt called saying her insurance did not cover the last syringes.  She is asking if they can be sent to the pharmacy downstairs( [facility])  She said she does not have 20.00 to pay for them at [facility].  CB#  [phone]

## 2023-04-08 NOTE — Telephone Encounter (Signed)
Prescription sent to our pharmacy for diabetic syringes as patient requested.

## 2023-04-09 ENCOUNTER — Other Ambulatory Visit: Payer: Self-pay

## 2023-04-09 NOTE — Telephone Encounter (Signed)
Called but no answer. LVM to call back.  

## 2023-04-12 DIAGNOSIS — H401112 Primary open-angle glaucoma, right eye, moderate stage: Secondary | ICD-10-CM | POA: Diagnosis not present

## 2023-04-12 DIAGNOSIS — H04123 Dry eye syndrome of bilateral lacrimal glands: Secondary | ICD-10-CM | POA: Diagnosis not present

## 2023-04-12 DIAGNOSIS — E103511 Type 1 diabetes mellitus with proliferative diabetic retinopathy with macular edema, right eye: Secondary | ICD-10-CM | POA: Diagnosis not present

## 2023-04-12 DIAGNOSIS — E103492 Type 1 diabetes mellitus with severe nonproliferative diabetic retinopathy without macular edema, left eye: Secondary | ICD-10-CM | POA: Diagnosis not present

## 2023-04-12 NOTE — Telephone Encounter (Signed)
Called but no answer. LVM to call back.  

## 2023-04-13 ENCOUNTER — Ambulatory Visit: Payer: Self-pay

## 2023-04-13 NOTE — Patient Instructions (Addendum)
Visit Information  Thank you for taking time to visit with me today. Please don't hesitate to contact me if I can be of assistance to you.   Following are the goals we discussed today:   Goals Addressed             This Visit's Progress    To better manage diabetes and lower A1c       Care Coordination Interventions: Provided education to patient about basic DM disease process Review of patient status, including review of consultants reports, relevant laboratory and other test results, and medications completed Advised patient, providing education and rationale, to check cbg daily before meals and at bedtime  and record, calling PCP for findings outside established parameters Educated patient while providing rationale, benefits from using continuous glucose monitoring and patient may consider for the future Counseled on Diabetic diet, my plate method, 295 minutes of moderate intensity exercise/week Discussed plans with patient for ongoing care coordination follow up and provided patient with direct contact information for nurse care coordinator Reviewed and discussed with patient her upcoming scheduled appointments Mailed printed educational materials related to Diabetes management Assessed patient's understanding of A1c goal: <7% Lab Results  Component Value Date   HGBA1C 9.4 (H) 01/21/2023        To Schedule Colonoscopy Care Coordination Interventions: Patient interviewed about adult health maintenance status including  Colonoscopy    Reviewed and discussed with patient she is due for her 3 year Colonoscopy per recommendations of Dr. Marina Goodell with [facility] due to having hx of pre-cancerous polyps and family history of colon cancer Discussed with patient she will call to schedule this procedure and she confirms having the contact number      Our next appointment is by telephone on 06/07/23 at 2:30 PM  Please call the care guide team at [phone] if you need to  cancel or reschedule your appointment.   If you are experiencing a Mental Health or Behavioral Health Crisis or need someone to talk to, please call 1-800-273-TALK (toll free, 24 hour hotline)  The patient verbalized understanding of instructions, educational materials, and care plan provided today and DECLINED offer to receive copy of patient instructions, educational materials, and care plan.   Delsa Sale RN BSN CCM [place]  [facility] Health Nurse Care Coordinator  Direct Dial: [phone] Website: [email or web address]

## 2023-04-13 NOTE — Telephone Encounter (Signed)
Called & spoke to the patient. Verified name & DOB. Informed that prescription for diabetic syringes were sent to our pharmacy. Patient expressed verbal understanding.

## 2023-04-13 NOTE — Patient Outreach (Signed)
Care Coordination   Initial Visit Note   04/13/2023 Name: Heidi [name] MRN: [id number] DOB: 07-13-1950  Heidi [name] is a 72 y.o. year old female who sees Marcine Matar, MD for primary care. I spoke with  [name] by phone today.  What matters to the patients health and wellness today?  Patient would like to work on lowering her A1c.     Goals Addressed             This Visit's Progress    To better manage diabetes and lower A1c       Care Coordination Interventions: Provided education to patient about basic DM disease process Review of patient status, including review of consultants reports, relevant laboratory and other test results, and medications completed Advised patient, providing education and rationale, to check cbg daily before meals and at bedtime  and record, calling PCP for findings outside established parameters Educated patient while providing rationale, benefits from using continuous glucose monitoring and patient may consider for the future Counseled on Diabetic diet, my plate method, 562 minutes of moderate intensity exercise/week Discussed plans with patient for ongoing care coordination follow up and provided patient with direct contact information for nurse care coordinator Reviewed and discussed with patient her upcoming scheduled appointments Mailed printed educational materials related to Diabetes management Assessed patient's understanding of A1c goal: <7% Lab Results  Component Value Date   HGBA1C 9.4 (H) 01/21/2023              To schedule Colonoscopy Care Coordination Interventions: Patient interviewed about adult health maintenance status including  Colonoscopy    Reviewed and discussed with patient she is due for her 2 year Colonoscopy per recommendations of Dr. Marina Goodell with [facility] due to having hx of pre-cancerous polyps and family history of colon cancer Discussed with patient she will call to schedule this  procedure and she confirms having the contact number   Interventions Today    Flowsheet Row Most Recent Value  Chronic Disease   Chronic disease during today's visit Diabetes, Chronic Kidney Disease/End Stage Renal Disease (ESRD), Other  [hypernatremia]  General Interventions   General Interventions Discussed/Reviewed General Interventions Discussed, General Interventions Reviewed, Doctor Visits, Labs, Durable Medical Equipment (DME)  Doctor Visits Discussed/Reviewed Doctor Visits Discussed, Doctor Visits Reviewed, PCP  Durable Medical Equipment (DME) Glucomoter  Exercise Interventions   Exercise Discussed/Reviewed Physical Activity, Exercise Reviewed, Exercise Discussed  Physical Activity Discussed/Reviewed Physical Activity Discussed, Physical Activity Reviewed, Types of exercise  Education Interventions   Education Provided Provided Education, Provided Printed Education  Provided Verbal Education On Blood Sugar Monitoring, Labs, Medication, When to see the doctor, Nutrition, Eye Care, Foot Care, Exercise  Labs Reviewed Hgb A1c, Kidney Function  [Na level]  Nutrition Interventions   Nutrition Discussed/Reviewed Nutrition Discussed, Nutrition Reviewed, Carbohydrate meal planning, Portion sizes  Pharmacy Interventions   Pharmacy Dicussed/Reviewed Pharmacy Topics Discussed, Pharmacy Topics Reviewed, Medications and their functions          SDOH assessments and interventions completed:  No     Care Coordination Interventions:  Yes, provided   Follow up plan: Follow up call scheduled for 06/07/23 @2 :30 PM    Encounter Outcome:  Patient Visit Completed

## 2023-04-15 ENCOUNTER — Other Ambulatory Visit: Payer: Self-pay

## 2023-05-04 ENCOUNTER — Other Ambulatory Visit: Payer: Self-pay | Admitting: Internal Medicine

## 2023-05-04 DIAGNOSIS — E1169 Type 2 diabetes mellitus with other specified complication: Secondary | ICD-10-CM

## 2023-05-17 ENCOUNTER — Encounter: Payer: Self-pay | Admitting: Licensed Clinical Social Worker

## 2023-05-17 ENCOUNTER — Telehealth: Payer: Self-pay | Admitting: Licensed Clinical Social Worker

## 2023-05-17 ENCOUNTER — Other Ambulatory Visit: Payer: Self-pay

## 2023-05-17 NOTE — Patient Outreach (Signed)
  Care Coordination   05/17/2023 Name: Heidi [name] MRN: [id number] DOB: 10/29/1950   Care Coordination Outreach Attempts:  An unsuccessful telephone outreach was attempted for a scheduled appointment today.  Follow Up Plan:  Additional outreach attempts will be made to offer the patient care coordination information and services.   Encounter Outcome:  No Answer   Care Coordination Interventions:  No, not indicated    Jenel Lucks, MSW, LCSW [facility] Care Management [place]  [facility] [email or web address] Phone [phone] 4:16 PM

## 2023-05-18 DIAGNOSIS — H401112 Primary open-angle glaucoma, right eye, moderate stage: Secondary | ICD-10-CM | POA: Diagnosis not present

## 2023-05-18 DIAGNOSIS — E103492 Type 1 diabetes mellitus with severe nonproliferative diabetic retinopathy without macular edema, left eye: Secondary | ICD-10-CM | POA: Diagnosis not present

## 2023-05-18 DIAGNOSIS — H04123 Dry eye syndrome of bilateral lacrimal glands: Secondary | ICD-10-CM | POA: Diagnosis not present

## 2023-05-18 DIAGNOSIS — E103511 Type 1 diabetes mellitus with proliferative diabetic retinopathy with macular edema, right eye: Secondary | ICD-10-CM | POA: Diagnosis not present

## 2023-05-19 NOTE — Telephone Encounter (Signed)
Copied from CRM [id number]. Topic: General - Other >> May 19, 2023 11:10 AM [name] wrote: Reason for CRM: The patient has returned a missed phone call from Ann Held  Please contact again when possible

## 2023-05-21 ENCOUNTER — Telehealth: Payer: Self-pay | Admitting: Pharmacist

## 2023-05-21 ENCOUNTER — Other Ambulatory Visit: Payer: Self-pay

## 2023-05-21 NOTE — Progress Notes (Signed)
Placed telephonic outreach to [name] today. Relayed to patient that [facility] will no longer be assisting in patient assistance support. Informed patient that the patient advocate with [facility] will be taking over her re enrollment for her Jardiance and Humalog.  Patient verbalized understanding and was appreciative of my call.   Reynold Bowen, PharmD Clinical Pharmacist [place] Direct Dial: [phone]

## 2023-05-24 ENCOUNTER — Ambulatory Visit: Payer: Medicare HMO | Admitting: Cardiology

## 2023-05-24 ENCOUNTER — Telehealth: Payer: Self-pay | Admitting: Licensed Clinical Social Worker

## 2023-05-24 NOTE — Patient Outreach (Signed)
  Care Coordination   Follow Up Visit Note   05/21/2023 Name: Heidi [name] MRN: [id number] DOB: 1951/07/01  Heidi [name] is a 72 y.o. year old female who sees Heidi Matar, MD for primary care. I spoke with  Heidi [name] by phone today.  What matters to the patients health and wellness today?  Symptom Management    Goals Addressed             This Visit's Progress    Obtain Supportive Resources   On track    Activities and task to complete in order to accomplish goals.   Keep all upcoming appointments discussed today Continue with compliance of taking medication prescribed by Doctor Implement healthy coping skills discussed to assist with management of symptoms Continue working with [facility] care team to assist with goals identified          SDOH assessments and interventions completed:  No     Care Coordination Interventions:  Yes, provided  Interventions Today    Flowsheet Row Most Recent Value  Chronic Disease   Chronic disease during today's visit Atrial Fibrillation (AFib), Congestive Heart Failure (CHF), Chronic Kidney Disease/End Stage Renal Disease (ESRD), Diabetes  General Interventions   General Interventions Discussed/Reviewed General Interventions Reviewed, Doctor Visits, Community Resources, Level of Care  [Extra Help starts November 1, Discussed Medicaid family planning. Planning on r/s upcoming cardiology appt due to financial strain after recent move and tight budget.]  Doctor Visits Discussed/Reviewed Doctor Visits Reviewed  Level of Care Applications  Applications Other  [Medicaid/Extra Help]  Education Interventions   Applications Other  [Medicaid/Extra Help]  Mental Health Interventions   Mental Health Discussed/Reviewed Mental Health Reviewed, Coping Strategies, Anxiety  [Pt discussed various stressors, including, financial strain after recent move. LCSW discussed strategies to assist with prioritizing goals and self-care. Pt denied  need for supportive resources to assist with stress management/finances]  Nutrition Interventions   Nutrition Discussed/Reviewed Nutrition Reviewed  Pharmacy Interventions   Pharmacy Dicussed/Reviewed Pharmacy Topics Reviewed, Medication Adherence  [Discussed barriers with refilling meds thru [facility] Jardiance. Wanted her to go online but not experienced with computers. Heidi [name] agreed to look into options noting recent closure of case, per pt]  Safety Interventions   Safety Discussed/Reviewed Safety Reviewed       Follow up plan: Follow up call scheduled for 4-6 weeks    Encounter Outcome:  Patient Visit Completed   Heidi [name], MSW, LCSW [facility] Care Management [facility] Health  [facility] [name] Phone [phone] 9:39 AM

## 2023-05-24 NOTE — Patient Instructions (Signed)
Visit Information  Thank you for taking time to visit with me today. Please don't hesitate to contact me if I can be of assistance to you.   Following are the goals we discussed today:   Goals Addressed             This Visit's Progress    Obtain Supportive Resources   On track    Activities and task to complete in order to accomplish goals.   Keep all upcoming appointments discussed today Continue with compliance of taking medication prescribed by Doctor Implement healthy coping skills discussed to assist with management of symptoms Continue working with [facility] care team to assist with goals identified          Our next appointment is by telephone on 12/27 at 12:30 PM  Please call the care guide team at [phone] if you need to cancel or reschedule your appointment.   If you are experiencing a Mental Health or Behavioral Health Crisis or need someone to talk to, please call the Suicide and Crisis Lifeline: 988 call 911   The patient verbalized understanding of instructions, educational materials, and care plan provided today and DECLINED offer to receive copy of patient instructions, educational materials, and care plan.   Jenel Lucks, MSW, LCSW [name] Care Management [place]  [facility] [email or web address] Phone [phone] 9:40 AM

## 2023-05-27 ENCOUNTER — Other Ambulatory Visit: Payer: Self-pay | Admitting: Internal Medicine

## 2023-05-27 DIAGNOSIS — M545 Low back pain, unspecified: Secondary | ICD-10-CM

## 2023-06-01 ENCOUNTER — Ambulatory Visit: Payer: Self-pay | Admitting: Internal Medicine

## 2023-06-04 ENCOUNTER — Other Ambulatory Visit: Payer: Self-pay | Admitting: Neurology

## 2023-06-07 ENCOUNTER — Ambulatory Visit: Payer: Self-pay

## 2023-06-07 NOTE — Patient Outreach (Signed)
  Care Coordination   06/07/2023 Name: Heidi [name] MRN: [id number] DOB: 1951-01-16   Care Coordination Outreach Attempts:  An unsuccessful telephone outreach was attempted for a scheduled appointment today.  Follow Up Plan:  Additional outreach attempts will be made to offer the patient care coordination information and services.   Encounter Outcome:  No Answer   Care Coordination Interventions:  No, not indicated    Delsa Sale RN BSN CCM [place]  [facility] Health Nurse Care Coordinator  Direct Dial: [phone] Website: [email or web address]

## 2023-06-08 ENCOUNTER — Telehealth: Payer: Self-pay

## 2023-06-08 NOTE — Telephone Encounter (Unsigned)
Copied from CRM [id number]. Topic: General - Other >> Jun 08, 2023  2:26 PM Dondra Prader E wrote: Reason for CRM: Pt called requesting to have her prescription renewed for empagliflozin (JARDIANCE) 10 MG TABS tablet with [facility] where she receives a discount.  Says she was in contact with [name] concerning this, best contact for Elmarie Shiley is [phone] >> Jun 08, 2023  2:36 PM Franchot Heidelberg wrote: [phone] she also provided this number for [facility]

## 2023-06-09 ENCOUNTER — Other Ambulatory Visit: Payer: Self-pay

## 2023-06-09 ENCOUNTER — Telehealth: Payer: Self-pay

## 2023-06-09 DIAGNOSIS — E1165 Type 2 diabetes mellitus with hyperglycemia: Secondary | ICD-10-CM

## 2023-06-09 DIAGNOSIS — E1169 Type 2 diabetes mellitus with other specified complication: Secondary | ICD-10-CM

## 2023-06-09 MED ORDER — EMPAGLIFLOZIN 10 MG PO TABS: 10.0000 mg | ORAL_TABLET | Freq: Every day | ORAL | 0 refills | Status: DC

## 2023-06-09 NOTE — Telephone Encounter (Signed)
Patient called stating that [facility] needs a refill for Jardiance. I will request that a refill be sent-advised patient that since her enrollment expires 07/06/2023, if the refill is not processed and shipped before then she may have to pay her copay amount.   Patient states that her shipping address has changed and wanted me to update it with [facility]. Advised that, unfortunately, the program requires address changes to come directly from the patient. I explained that I would call her if a refill is able to be sent to [place] so that she can call to confirm receipt by the program and update her address.  Patient states she did get her re-enrollment applications for Jardiance, Eliquis and Humalog that were mailed to her. She understands that the applications will need to be completed in their entirety and returned to myself/office, including required proof of income/LIS denial letter, as soon as possible. Patient is aware that if her enrollments are not re-approved for year 2025 before 07/06/2023 that she will be responsible for paying her copay amounts after 07/07/2023.

## 2023-06-09 NOTE — Telephone Encounter (Signed)
Patient called back to check on the status of request below concerning her medication empagliflozin (JARDIANCE) 10 MG TABS tablet, stating they told her prescription has expired and needs a new prescription. Patient states she should of already had this medication way before the end of November and expressed urgency of this.   Pharmacy: [facility]   Patient's callback #[phone]   [facility] CARES FOUNDATION PATIENT ASSISTANCE PROGRAM Toll Free [phone] FAX [phone] (On Paperwork she has she states)

## 2023-06-10 ENCOUNTER — Other Ambulatory Visit: Payer: Self-pay

## 2023-06-11 ENCOUNTER — Other Ambulatory Visit: Payer: Self-pay

## 2023-06-11 ENCOUNTER — Telehealth: Payer: Self-pay

## 2023-06-11 NOTE — Telephone Encounter (Signed)
Spoke to [place] with [employer] and she was able to re-establish patient's enrollment (end date 07/06/2023), update shipping address to [address] [id number], and will work on expediting a refill for the Jardiance 10mg  to be shipped to patient at no charge. A follow-up call will be made to [facility] on 12/11 to verify shipment information.

## 2023-06-15 ENCOUNTER — Other Ambulatory Visit (HOSPITAL_COMMUNITY): Payer: Self-pay

## 2023-06-15 ENCOUNTER — Other Ambulatory Visit: Payer: Self-pay

## 2023-06-16 ENCOUNTER — Other Ambulatory Visit: Payer: Self-pay | Admitting: Internal Medicine

## 2023-06-16 ENCOUNTER — Other Ambulatory Visit: Payer: Self-pay

## 2023-06-16 DIAGNOSIS — I10 Essential (primary) hypertension: Secondary | ICD-10-CM

## 2023-06-17 ENCOUNTER — Telehealth: Payer: Self-pay

## 2023-06-17 ENCOUNTER — Other Ambulatory Visit: Payer: Self-pay

## 2023-06-17 NOTE — Telephone Encounter (Signed)
Called [facility] to check on shipment status of patient's Jardiance PAP and was told that it was delivered to [address] on 06/12/2023. Patient confirmed receipt.

## 2023-06-22 ENCOUNTER — Other Ambulatory Visit: Payer: Self-pay

## 2023-07-02 ENCOUNTER — Other Ambulatory Visit: Payer: Self-pay

## 2023-07-02 ENCOUNTER — Encounter: Payer: Self-pay | Admitting: Licensed Clinical Social Worker

## 2023-07-05 ENCOUNTER — Telehealth: Payer: Self-pay | Admitting: Licensed Clinical Social Worker

## 2023-07-05 NOTE — Patient Outreach (Signed)
  Care Coordination   07/02/2023 Name: Heidi [name] MRN: [id number] DOB: 1951/01/14   Care Coordination Outreach Attempts:  An unsuccessful outreach was attempted for an appointment today.  Follow Up Plan:  Additional outreach attempts will be made to offer the patient complex care management information and services.   Encounter Outcome:  No Answer   Care Coordination Interventions:  No, not indicated    Jenel Lucks, MSW, LCSW [facility] Care Management [place]  [facility] [email or web address] Phone [phone] 4:23 PM

## 2023-07-15 ENCOUNTER — Telehealth: Payer: Self-pay | Admitting: *Deleted

## 2023-07-15 NOTE — Progress Notes (Signed)
 Complex Care Management Care Guide Note  07/15/2023 Name: Heidi [name] MRN: [id number] DOB: 08/01/1950  Heidi [name] is a 73 y.o. year old female who is a primary care patient of Vicci Barnie NOVAK, MD and is actively engaged with the care management team. I reached out to [name] by phone today to assist with re-scheduling  with the RN Case Manager Licensed Clinical Social Worker.  Follow up plan: Unsuccessful telephone outreach attempt made. A HIPAA compliant phone message was left for the patient providing contact information and requesting a return call.  [facility]  Care Coordination Care Guide  Direct Dial: [phone]

## 2023-07-22 ENCOUNTER — Other Ambulatory Visit: Payer: Self-pay | Admitting: Internal Medicine

## 2023-07-22 DIAGNOSIS — M545 Low back pain, unspecified: Secondary | ICD-10-CM

## 2023-07-23 ENCOUNTER — Other Ambulatory Visit: Payer: Self-pay | Admitting: Internal Medicine

## 2023-07-23 ENCOUNTER — Telehealth: Payer: Self-pay | Admitting: Internal Medicine

## 2023-07-23 ENCOUNTER — Ambulatory Visit: Payer: Medicare HMO | Admitting: Internal Medicine

## 2023-07-23 DIAGNOSIS — M545 Low back pain, unspecified: Secondary | ICD-10-CM

## 2023-07-23 DIAGNOSIS — E1169 Type 2 diabetes mellitus with other specified complication: Secondary | ICD-10-CM

## 2023-07-23 DIAGNOSIS — J4540 Moderate persistent asthma, uncomplicated: Secondary | ICD-10-CM

## 2023-07-23 LAB — PULMONARY FUNCTION TEST
DL/VA % pred: 125 %
DL/VA: 5.18 ml/min/mmHg/L
DLCO cor % pred: 61 %
DLCO cor: 11.98 ml/min/mmHg
DLCO unc % pred: 61 %
DLCO unc: 11.98 ml/min/mmHg
FEF 25-75 Post: 0.6 L/s
FEF 25-75 Pre: 1.43 L/s
FEF2575-%Change-Post: -58 %
FEF2575-%Pred-Post: 32 %
FEF2575-%Pred-Pre: 77 %
FEV1-%Change-Post: -14 %
FEV1-%Pred-Post: 51 %
FEV1-%Pred-Pre: 59 %
FEV1-Post: 1.14 L
FEV1-Pre: 1.34 L
FEV1FVC-%Change-Post: 0 %
FEV1FVC-%Pred-Pre: 109 %
FEV6-%Change-Post: -14 %
FEV6-%Pred-Post: 48 %
FEV6-%Pred-Pre: 57 %
FEV6-Post: 1.38 L
FEV6-Pre: 1.62 L
FEV6FVC-%Pred-Post: 104 %
FEV6FVC-%Pred-Pre: 104 %
FVC-%Change-Post: -14 %
FVC-%Pred-Post: 46 %
FVC-%Pred-Pre: 54 %
FVC-Post: 1.38 L
FVC-Pre: 1.62 L
Post FEV1/FVC ratio: 83 %
Post FEV6/FVC ratio: 100 %
Pre FEV1/FVC ratio: 83 %
Pre FEV6/FVC Ratio: 100 %
RV % pred: 62 %
RV: 1.38 L
TLC % pred: 56 %
TLC: 2.87 L

## 2023-07-23 NOTE — Telephone Encounter (Signed)
Medication Refill -  Most Recent Primary Care Visit:  Provider: Anthoney Harada  Department: [facility]  Visit Type: MEDICARE AWV, INITIAL  Date: 03/23/2023  Medication: tiZANidine (ZANAFLEX) 2 MG tablet   Has the patient contacted their pharmacy? Yes   Is this the correct pharmacy for this prescription? Yes   This is the patient's preferred pharmacy:   [id number] - [place], [place] - [address] AT [facility] OF [address] [place] [address] Phone: [phone] Fax: [phone]   Has the prescription been filled recently? Yes  Is the patient out of the medication? Yes  Has the patient been seen for an appointment in the last year OR does the patient have an upcoming appointment? Yes  Can we respond through MyChart? Yes  Agent: Please be advised that Rx refills may take up to 3 business days. We ask that you follow-up with your pharmacy.

## 2023-07-23 NOTE — Telephone Encounter (Signed)
Requested medications are due for refill today.  yes  Requested medications are on the active medications list.  yes  Last refill. 05/27/2023 #30 1 rf  Future visit scheduled.   no  Notes to clinic.  Refill/refusal not delegated.    Requested Prescriptions  Pending Prescriptions Disp Refills   tiZANidine (ZANAFLEX) 2 MG tablet 30 tablet 1     Not Delegated - Cardiovascular:  Alpha-2 Agonists - tizanidine Failed - 07/23/2023  5:59 PM      Failed - This refill cannot be delegated      Failed - Valid encounter within last 6 months    Recent Outpatient Visits           6 months ago Type 2 diabetes mellitus with obesity (HCC)   [place] [facility] - A Dept Of [place]. [facility] Jonah Blue B, MD   10 months ago Type 2 diabetes mellitus with morbid obesity [facility])   [facility] - A Dept Of [place]. [facility] Marcine Matar, MD   1 year ago Hospital discharge follow-up   [place] [facility] - A Dept Of [place]. [facility] Jonah Blue B, MD   1 year ago Type 2 diabetes mellitus with morbid obesity [facility])   [place] [facility] - A Dept Of [place]. [facility] Marcine Matar, MD   1 year ago No-show for appointment   [facility] - A Dept Of [place]. [facility] Marcine Matar, MD       Future Appointments             In 3 weeks Celine Mans, Olene Craven, MD [facility] Pulmonary Care at [place]   In 1 month Patwardhan, Anabel Bene, MD [facility] HeartCare at [facility], [facility]

## 2023-07-23 NOTE — Telephone Encounter (Signed)
Unable to refill per protocol, request is too soon. Last refill 05/04/23 for 90 and 2 refills.  Requested Prescriptions  Pending Prescriptions Disp Refills   atorvastatin (LIPITOR) 40 MG tablet [Pharmacy Med Name: Atorvastatin Calcium Oral Tablet 40 MG] 90 tablet 3    Sig: TAKE 1 TABLET EVERY EVENING     Cardiovascular:  Antilipid - Statins Failed - 07/23/2023  8:36 AM      Failed - Lipid Panel in normal range within the last 12 months    Cholesterol, Total  Date Value Ref Range Status  09/21/2022 125 100 - 199 mg/dL Final   LDL Chol Calc (NIH)  Date Value Ref Range Status  09/21/2022 37 0 - 99 mg/dL Final   HDL  Date Value Ref Range Status  09/21/2022 63 >39 mg/dL Final   Triglycerides  Date Value Ref Range Status  09/21/2022 148 0 - 149 mg/dL Final         Passed - Patient is not pregnant      Passed - Valid encounter within last 12 months    Recent Outpatient Visits           6 months ago Type 2 diabetes mellitus with obesity (HCC)   [place] [facility] - A Dept Of [place]. [facility] Jonah Blue B, MD   10 months ago Type 2 diabetes mellitus with morbid obesity [facility])   [place] [facility] - A Dept Of [place]. [facility] Marcine Matar, MD   1 year ago Hospital discharge follow-up   [place] [facility] - A Dept Of [place]. [facility] Jonah Blue B, MD   1 year ago Type 2 diabetes mellitus with morbid obesity [facility])   [place] [facility] - A Dept Of [place]. [facility] Marcine Matar, MD   1 year ago No-show for appointment   [facility] - A Dept Of . [facility] Marcine Matar, MD       Future Appointments             In 1 month Patwardhan, Anabel Bene, MD [facility] HeartCare at [facility], [facility]

## 2023-07-23 NOTE — Telephone Encounter (Signed)
Copied from CRM [id number]. Topic: General - Other >> Jul 23, 2023  3:30 PM Teressa P wrote: Reason for CRM: pt called asking if Dr. Laural Benes could prescribe her Diclofenac Sodium 1%  for the pain in her arm.  [facility]

## 2023-07-23 NOTE — Telephone Encounter (Signed)
Medication Refill -  Most Recent Primary Care Visit:  Provider: Anthoney Harada  Department: [facility]  Visit Type: MEDICARE AWV, INITIAL  Date: 03/23/2023  Medication: Insulin Syringe-Needle U-100 (INSULIN SYRINGE 1CC/31GX5/16") 31G X 5/16" 1 ML MISC   Has the patient contacted their pharmacy? Yes  Is this the correct pharmacy for this prescription? Yes  [facility]  This is the patient's preferred pharmacy:   Has the prescription been filled recently? Yes  Is the patient out of the medication? No  Has the patient been seen for an appointment in the last year OR does the patient have an upcoming appointment? Yes  Can we respond through MyChart? Yes  Agent: Please be advised that Rx refills may take up to 3 business days. We ask that you follow-up with your pharmacy.

## 2023-07-23 NOTE — Telephone Encounter (Signed)
Medication Refill -  Most Recent Primary Care Visit:  Provider: Anthoney Harada  Department: [facility]  Visit Type: MEDICARE AWV, INITIAL  Date: 03/23/2023  Medication: insulin lispro protamine-lispro (HUMALOG MIX 75/25) (75-25) 100 UNIT/ML SUSP injection   Has the patient contacted their pharmacy? Yes   Is this the correct pharmacy for this prescription? Yes If no, delete pharmacy and type the correct one.  This is the patient's preferred pharmacy:  [facility]  [address] [place], [place]   Has the prescription been filled recently? Yes  Is the patient out of the medication? Yes  Has the patient been seen for an appointment in the last year OR does the patient have an upcoming appointment? Yes  Can we respond through MyChart? Yes  Agent: Please be advised that Rx refills may take up to 3 business days. We ask that you follow-up with your pharmacy.

## 2023-07-24 MED ORDER — DICLOFENAC SODIUM 1 % EX GEL: 2.0000 g | Freq: Three times a day (TID) | CUTANEOUS | 1 refills | Status: DC | PRN

## 2023-07-24 NOTE — Telephone Encounter (Signed)
Rxn sent [facility] as [facility].

## 2023-07-24 NOTE — Addendum Note (Signed)
Addended by: Jonah Blue B on: 07/24/2023 11:20 AM   Modules accepted: Orders

## 2023-07-26 ENCOUNTER — Other Ambulatory Visit: Payer: Self-pay | Admitting: Internal Medicine

## 2023-07-26 ENCOUNTER — Other Ambulatory Visit: Payer: Self-pay

## 2023-07-26 DIAGNOSIS — M545 Low back pain, unspecified: Secondary | ICD-10-CM

## 2023-07-26 MED ORDER — INSULIN SYRINGE 31G X 5/16" 1 ML MISC
6 refills | Status: AC
  Filled 2023-07-26: qty 200, 90d supply, fill #0
  Filled 2023-09-07: qty 100, 50d supply, fill #0
  Filled 2023-11-22: qty 100, 50d supply, fill #1
  Filled 2024-02-09 – 2024-02-10 (×2): qty 100, 50d supply, fill #2
  Filled 2024-04-10: qty 100, 50d supply, fill #3
  Filled 2024-05-25: qty 100, 50d supply, fill #4
  Filled 2024-07-14: qty 100, 50d supply, fill #5

## 2023-07-26 MED ORDER — HUMALOG MIX 75/25 (75-25) 100 UNIT/ML ~~LOC~~ SUSP
SUBCUTANEOUS | 0 refills | Status: DC
  Filled 2023-07-26: qty 30, 33d supply, fill #0

## 2023-07-26 NOTE — Telephone Encounter (Signed)
 Called but no answer. LVM informing that prescription was sent to the pharmacy.

## 2023-07-26 NOTE — Progress Notes (Signed)
Complex Care Management Care Guide Note  07/26/2023 Name: Heidi [name] MRN: [id number] DOB: 10-07-1950  Heidi [name] is a 73 y.o. year old female who is a primary care patient of Marcine Matar, MD and is actively engaged with the care management team. I reached out to [name] by phone today to assist with re-scheduling  with the RN Case Manager Licensed Clinical Social Worker.  Follow up plan: Unsuccessful telephone outreach attempt made. A HIPAA compliant phone message was left for the patient providing contact information and requesting a return call. No further outreach attempts will be made at this time. We have been unable to contact the patient for complex care management services.  Gwenevere Ghazi  [facility]  [facility], [place] Guide  Direct Dial: [phone]  Fax [phone]

## 2023-07-26 NOTE — Telephone Encounter (Signed)
Requested medication (s) are due for refill today: yes  Requested medication (s) are on the active medication list: yes  Last refill:  05/27/23  Future visit scheduled: no  Notes to clinic:  Unable to refill per protocol, cannot delegate.       Requested Prescriptions  Pending Prescriptions Disp Refills   tiZANidine (ZANAFLEX) 2 MG tablet 30 tablet 1     Not Delegated - Cardiovascular:  Alpha-2 Agonists - tizanidine Failed - 07/26/2023 11:06 AM      Failed - This refill cannot be delegated      Failed - Valid encounter within last 6 months    Recent Outpatient Visits           6 months ago Type 2 diabetes mellitus with obesity (HCC)   [place] [employer] [facility] - A Dept Of [place]. [facility] Jonah Blue B, MD   10 months ago Type 2 diabetes mellitus with morbid obesity [facility])   [place] [facility] - A Dept Of [place]. [facility] Marcine Matar, MD   1 year ago Hospital discharge follow-up   [place] [facility] - A Dept Of [place]. [facility] Jonah Blue B, MD   1 year ago Type 2 diabetes mellitus with morbid obesity [facility])   [place] [facility] - A Dept Of [place]. [facility] Marcine Matar, MD   1 year ago No-show for appointment   [facility] - A Dept Of [place]. [facility] Marcine Matar, MD       Future Appointments             In 3 weeks Celine Mans, Olene Craven, MD [facility] Pulmonary Care at [place]   In 1 month Patwardhan, Anabel Bene, MD [facility] HeartCare at [facility], [facility]

## 2023-07-26 NOTE — Telephone Encounter (Signed)
Requested Prescriptions  Pending Prescriptions Disp Refills   insulin lispro protamine-lispro (HUMALOG MIX 75/25) (75-25) 100 UNIT/ML SUSP injection 30 mL 0    Sig: Inject 50 units in the morning and 40 units in the evening.     Endocrinology:  Diabetes - Insulins Failed - 07/26/2023  7:42 AM      Failed - HBA1C is between 0 and 7.9 and within 180 days    HbA1c, POC (controlled diabetic range)  Date Value Ref Range Status  01/21/2023 9.1 (A) 0.0 - 7.0 % Final   Hgb A1c MFr Bld  Date Value Ref Range Status  01/21/2023 9.4 (H) 4.8 - 5.6 % Final    Comment:             Prediabetes: 5.7 - 6.4          Diabetes: >6.4          Glycemic control for adults with diabetes: <7.0          Failed - Valid encounter within last 6 months    Recent Outpatient Visits           6 months ago Type 2 diabetes mellitus with obesity (HCC)   [place] [facility] - A Dept Of [place]. [facility] Jonah Blue B, MD   10 months ago Type 2 diabetes mellitus with morbid obesity [facility])   [place] [facility] - A Dept Of [place]. [facility] Marcine Matar, MD   1 year ago Hospital discharge follow-up   [place] [facility] - A Dept Of [place]. [facility] Jonah Blue B, MD   1 year ago Type 2 diabetes mellitus with morbid obesity [facility])   [place] [facility] - A Dept Of [place]. [facility] Marcine Matar, MD   1 year ago No-show for appointment   [facility] - A Dept Of [place]. [facility] Marcine Matar, MD       Future Appointments             In 3 weeks Celine Mans, Olene Craven, MD [facility] Pulmonary Care at [place]   In 1 month Patwardhan, Anabel Bene, MD [facility] HeartCare at [facility], [facility]

## 2023-07-26 NOTE — Telephone Encounter (Signed)
Requested Prescriptions  Pending Prescriptions Disp Refills   Insulin Syringe-Needle U-100 (INSULIN SYRINGE 1CC/31GX5/16") 31G X 5/16" 1 ML MISC 200 each 6    Sig: Use to inject Humalog 75/25 mix twice daily     There is no refill protocol information for this order

## 2023-07-26 NOTE — Telephone Encounter (Signed)
Medication Refill -  Most Recent Primary Care Visit:  Provider: Anthoney Harada  Department: [facility]  Visit Type: MEDICARE AWV, INITIAL  Date: 03/23/2023  Medication: tiZANidine (ZANAFLEX) 2 MG tablet   Has the patient contacted their pharmacy? No  Is this the correct pharmacy for this prescription? Yes  This is the patient's preferred pharmacy:   [id number] - [place], [place] - [address] AT [facility] OF [address] [place] [address] Phone: [phone] Fax: [phone]  Has the prescription been filled recently? Yes  Is the patient out of the medication? Yes  Has the patient been seen for an appointment in the last year OR does the patient have an upcoming appointment? Yes  Can we respond through MyChart? No  Agent: Please be advised that Rx refills may take up to 3 business days. We ask that you follow-up with your pharmacy.

## 2023-07-26 NOTE — Telephone Encounter (Signed)
Medication Refill -  Most Recent Primary Care Visit:  Provider: Anthoney Harada  Department: [facility]  Visit Type: MEDICARE AWV, INITIAL  Date: 03/23/2023  Medication: diclofenac Sodium (VOLTAREN) 1 % GEL /tiZANidine (ZANAFLEX) 2 MG tablet  Pt now has an appt for Mar 27  Has the patient contacted their pharmacy? yes  (Agent: If yes, when and what did the pharmacy advise?)contct pcp  Is this the correct pharmacy for this prescription? yes   This is the patient's preferred pharmacy:   [facility][id number] - [place], [place] - [address] AT [facility] OF [address] [place] [address] Phone: [phone] Fax: [phone]     Has the prescription been filled recently? no  Is the patient out of the medication? yes  Has the patient been seen for an appointment in the last year OR does the patient have an upcoming appointment? yes  Can we respond through MyChart? yes  Agent: Please be advised that Rx refills may take up to 3 business days. We ask that you follow-up with your pharmacy.

## 2023-07-27 ENCOUNTER — Other Ambulatory Visit: Payer: Self-pay

## 2023-07-27 NOTE — Telephone Encounter (Signed)
 Called but no answer. LVM informing that prescription was sent to the pharmacy.

## 2023-07-27 NOTE — Telephone Encounter (Signed)
Requested medication (s) are due for refill today: yes  Requested medication (s) are on the active medication list: yes  Last refill:  05/27/23  Future visit scheduled: yes  Notes to clinic:  Unable to refill per protocol, cannot delegate.      Requested Prescriptions  Pending Prescriptions Disp Refills   tiZANidine (ZANAFLEX) 2 MG tablet 30 tablet 1     Not Delegated - Cardiovascular:  Alpha-2 Agonists - tizanidine Failed - 07/27/2023  8:15 AM      Failed - This refill cannot be delegated      Failed - Valid encounter within last 6 months    Recent Outpatient Visits           6 months ago Type 2 diabetes mellitus with obesity (HCC)   [place] [facility] - A Dept Of [place]. [facility] Jonah Blue B, MD   10 months ago Type 2 diabetes mellitus with morbid obesity [facility])   [place] [facility] - A Dept Of [place]. [facility] Marcine Matar, MD   1 year ago Hospital discharge follow-up   [place] [facility] - A Dept Of [place]. [facility] Jonah Blue B, MD   1 year ago Type 2 diabetes mellitus with morbid obesity [facility])   [place] [facility] Pr[facility] - A Dept Of [place]. [facility] Marcine Matar, MD   1 year ago No-show for appointment   [facility] - A Dept Of . [facility] Marcine Matar, MD       Future Appointments             In 3 weeks Charlott Holler, MD [facility] Pulmonary Care at [place]   In 1 month Patwardhan, Anabel Bene, MD [facility] HeartCare at [facility], [facility]   In 2 months Laural Benes, Binnie Rail, MD [facility] - A Dept Of Eligha Bridegroom. [facility]            Refused Prescriptions Disp Refills   diclofenac Sodium (VOLTAREN) 1 % GEL 100 g 1    Sig: Apply 2 g topically every 8 (eight) hours as needed.     Analgesics:  Topicals Failed - 07/27/2023  8:15 AM       Failed - Manual Review: Labs are only required if the patient has taken medication for more than 8 weeks.      Failed - PLT in normal range and within 360 days    Platelets  Date Value Ref Range Status  03/07/2022 229 150 - 400 K/uL Final  08/30/2020 128 (L) 150 - 450 x10E3/uL Final         Failed - HGB in normal range and within 360 days    Hemoglobin  Date Value Ref Range Status  03/07/2022 13.4 12.0 - 15.0 g/dL Final  91/47/8295 62.1 11.1 - 15.9 g/dL Final         Failed - HCT in normal range and within 360 days    HCT  Date Value Ref Range Status  03/07/2022 43.0 36.0 - 46.0 % Final   Hematocrit  Date Value Ref Range Status  08/30/2020 41.9 34.0 - 46.6 % Final         Failed - Cr in normal range and within 360 days    Creat  Date Value Ref Range Status  05/12/2016 1.14 (H) 0.50 -  0.99 mg/dL Final    Comment:      For patients > or = 72 years of age: The upper reference limit for Creatinine is approximately 13% higher for people identified as African-American.      Creatinine, Ser  Date Value Ref Range Status  01/21/2023 1.09 (H) 0.57 - 1.00 mg/dL Final   Creatinine, Urine  Date Value Ref Range Status  11/27/2019 146.00 mg/dL Final    Comment:    Performed at [facility], [address] [place], [place] [address]         Passed - eGFR is 30 or above and within 360 days    GFR, Est African American  Date Value Ref Range Status  05/12/2016 59 (L) >=60 mL/min Final   GFR calc Af Amer  Date Value Ref Range Status  08/30/2020 56 (L) >59 mL/min/1.73 Final    Comment:    **In accordance with recommendations from the NKF-ASN Task force,**   [facility] is in the process of updating its eGFR calculation to the   2021 CKD-EPI creatinine equation that estimates kidney function   without a race variable.    GFR, Est Non African American  Date Value Ref Range Status  05/12/2016 51 (L) >=60 mL/min Final   GFR, Estimated  Date Value Ref Range Status   03/07/2022 46 (L) >60 mL/min Final    Comment:    (NOTE) Calculated using the CKD-EPI Creatinine Equation (2021)    eGFR  Date Value Ref Range Status  01/21/2023 55 (L) >59 mL/min/1.73 Final         Passed - Patient is not pregnant      Passed - Valid encounter within last 12 months    Recent Outpatient Visits           6 months ago Type 2 diabetes mellitus with obesity (HCC)   [place] [facility] - A Dept Of [place]. [facility] Jonah Blue B, MD   10 months ago Type 2 diabetes mellitus with morbid obesity [facility])   [place] [facility] Proud - A Dept Of [place]. [facility] Marcine Matar, MD   1 year ago Hospital discharge follow-up   [place] [facility] - A Dept Of [place]. [facility] Jonah Blue B, MD   1 year ago Type 2 diabetes mellitus with morbid obesity [facility])    Comm Health Merry Proud - A Dept Of [place]. [facility] Marcine Matar, MD   1 year ago No-show for appointment   [facility] Merry Proud - A Dept Of [place]. [facility] Marcine Matar, MD       Future Appointments             In 3 weeks Charlott Holler, MD [facility] Pulmonary Care at [place]   In 1 month Patwardhan, Anabel Bene, MD [facility] HeartCare at [facility], [facility]   In 2 months Laural Benes, Binnie Rail, MD [facility] Comm Health [facility] Proud - A Dept Of [place]igha Bridegroom. [facility]

## 2023-07-27 NOTE — Telephone Encounter (Signed)
Duplicate request, last refilled 07/24/23.  Requested Prescriptions  Pending Prescriptions Disp Refills   diclofenac Sodium (VOLTAREN) 1 % GEL 100 g 1    Sig: Apply 2 g topically every 8 (eight) hours as needed.     Analgesics:  Topicals Failed - 07/27/2023  8:15 AM      Failed - Manual Review: Labs are only required if the patient has taken medication for more than 8 weeks.      Failed - PLT in normal range and within 360 days    Platelets  Date Value Ref Range Status  03/07/2022 229 150 - 400 K/uL Final  08/30/2020 128 (L) 150 - 450 x10E3/uL Final         Failed - HGB in normal range and within 360 days    Hemoglobin  Date Value Ref Range Status  03/07/2022 13.4 12.0 - 15.0 g/dL Final  04/54/0981 19.1 11.1 - 15.9 g/dL Final         Failed - HCT in normal range and within 360 days    HCT  Date Value Ref Range Status  03/07/2022 43.0 36.0 - 46.0 % Final   Hematocrit  Date Value Ref Range Status  08/30/2020 41.9 34.0 - 46.6 % Final         Failed - Cr in normal range and within 360 days    Creat  Date Value Ref Range Status  05/12/2016 1.14 (H) 0.50 - 0.99 mg/dL Final    Comment:      For patients > or = 72 years of age: The upper reference limit for Creatinine is approximately 13% higher for people identified as African-American.      Creatinine, Ser  Date Value Ref Range Status  01/21/2023 1.09 (H) 0.57 - 1.00 mg/dL Final   Creatinine, Urine  Date Value Ref Range Status  11/27/2019 146.00 mg/dL Final    Comment:    Performed at [name], [address], [place], [place] [address]         Passed - eGFR is 30 or above and within 360 days    GFR, Est African American  Date Value Ref Range Status  05/12/2016 59 (L) >=60 mL/min Final   GFR calc Af Amer  Date Value Ref Range Status  08/30/2020 56 (L) >59 mL/min/1.73 Final    Comment:    **In accordance with recommendations from the NKF-ASN Task force,**   [facility] is in the process of updating its  eGFR calculation to the   2021 CKD-EPI creatinine equation that estimates kidney function   without a race variable.    GFR, Est Non African American  Date Value Ref Range Status  05/12/2016 51 (L) >=60 mL/min Final   GFR, Estimated  Date Value Ref Range Status  03/07/2022 46 (L) >60 mL/min Final    Comment:    (NOTE) Calculated using the CKD-EPI Creatinine Equation (2021)    eGFR  Date Value Ref Range Status  01/21/2023 55 (L) >59 mL/min/1.73 Final         Passed - Patient is not pregnant      Passed - Valid encounter within last 12 months    Recent Outpatient Visits           6 months ago Type 2 diabetes mellitus with obesity (HCC)   [facility] Dept Of [place]. [facility] Jonah Blue B, MD   10 months ago Type 2 diabetes mellitus with morbid  obesity (HCC)   [facility] - A Dept Of [place]. [facility] Marcine Matar, MD   1 year ago Hospital discharge follow-up   [facility] - A Dept Of [place]. [facility] Jonah Blue B, MD   1 year ago Type 2 diabetes mellitus with morbid obesity [facility])   [facility] [facility] - A Dept Of [place]. [facility] Marcine Matar, MD   1 year ago No-show for appointment   [facility] - A Dept Of [place]. [facility] Marcine Matar, MD       Future Appointments             In 3 weeks Charlott Holler, MD [facility] at [place]   In 1 month Patwardhan, Anabel Bene, MD [facility] at [facility], [facility]   In 2 months Laural Benes, Binnie Rail, MD [facility] - A Dept Of [place]. [facility]             tiZANidine (ZANAFLEX) 2 MG tablet 30 tablet 1     Not Delegated - Cardiovascular:  Alpha-2 Agonists - tizanidine Failed - 07/27/2023  8:15 AM      Failed - This refill cannot be delegated      Failed  - Valid encounter within last 6 months    Recent Outpatient Visits           6 months ago Type 2 diabetes mellitus with obesity (HCC)   [facility] [facility] - A Dept Of [place]. [facility] Jonah Blue B, MD   10 months ago Type 2 diabetes mellitus with morbid obesity [facility])   [facility] - A Dept Of [place]. [facility] Marcine Matar, MD   1 year ago Hospital discharge follow-up   [facility] - A Dept Of [place]. [facility] Jonah Blue B, MD   1 year ago Type 2 diabetes mellitus with morbid obesity [facility])   [facility] [facility] - A Dept Of [place]. [facility] Marcine Matar, MD   1 year ago No-show for appointment   [facility] - A Dept Of [place]. [facility] Marcine Matar, MD       Future Appointments             In 3 weeks Charlott Holler, MD [facility] at [place]   In 1 month Patwardhan, Anabel Bene, MD [facility] at [facility], [facility]   In 2 months Laural Benes, Binnie Rail, MD [facility] - A Dept Of [place] [facility]. [facility]

## 2023-07-28 ENCOUNTER — Other Ambulatory Visit: Payer: Self-pay

## 2023-07-28 ENCOUNTER — Telehealth: Payer: Self-pay | Admitting: Internal Medicine

## 2023-07-28 NOTE — Telephone Encounter (Signed)
Pt called reporting that she needs the pill form called in because her insurance does not cover the gel, please advise. Also needs an appt with PCP to refill other meds. Nothing available please advise, CRM sent.

## 2023-07-28 NOTE — Telephone Encounter (Signed)
Let patient know that the pill form of diclofenac is not recommended because of her kidney function.  Diclofenac gel can be purchased over-the-counter if her insurance does not pay for it. Please find out which medicines she needs refill on from me.  Looking at her prescriptions, she should be good until she sees me in March.

## 2023-07-29 MED ORDER — TIZANIDINE HCL 2 MG PO TABS: ORAL_TABLET | ORAL | 1 refills | Status: DC

## 2023-07-29 NOTE — Addendum Note (Signed)
Addended by: Jonah Blue B on: 07/29/2023 06:24 PM   Modules accepted: Orders

## 2023-07-29 NOTE — Telephone Encounter (Signed)
Called & spoke to the patient. Verified name & DOB. Informed that the pill form of diclofenac is not recommended because of her kidney function. Informed that it can be purchased OTC. Patient expressed verbal understanding.   Heidi [name] is requesting a refill for  tiZANidine (ZANAFLEX) 2 MG tablet.

## 2023-07-29 NOTE — Telephone Encounter (Signed)
 Duplicate

## 2023-08-02 ENCOUNTER — Other Ambulatory Visit: Payer: Self-pay

## 2023-08-06 ENCOUNTER — Other Ambulatory Visit: Payer: Self-pay | Admitting: Internal Medicine

## 2023-08-06 NOTE — Telephone Encounter (Signed)
Copied from CRM [id number]. Topic: Clinical - Medication Refill >> Aug 06, 2023 10:51 AM Phill Myron wrote: Most Recent Primary Care Visit:  Provider: Anthoney Harada  Department[facility]  Visit Type: MEDICARE AWV, INITIAL  Date: 03/23/2023  Medication: apixaban (ELIQUIS) 5 MG TABS tablet  Has the patient contacted their pharmacy? No (Agent: If no, request that the patient contact the pharmacy for the refill. If patient does not wish to contact the pharmacy document the reason why and proceed with request.) (Agent: If yes, when and what did the pharmacy advise?)  Is this the correct pharmacy for this prescription? Yes If no, delete pharmacy and type the correct one.    This is the patient's preferred pharmacy:   [facility] [address], [address] [place] [address] Phone: [phone] Fax: [phone]: M-F 7:30a-6:00p     Is the patient out of the medication? No  patient has one pill left   Has the patient been seen for an appointment in the last year OR does the patient have an upcoming appointment? Yes  Can we respond through MyChart? No  Agent: Please be advised that Rx refills may take up to 3 business days. We ask that you follow-up with your pharmacy.

## 2023-08-06 NOTE — Telephone Encounter (Signed)
Last Fill: 11/27/22  Last OV: 01/21/23 Next OV: 08/19/23  Routing to provider for review/authorization.

## 2023-08-07 MED ORDER — APIXABAN 5 MG PO TABS: 5.0000 mg | ORAL_TABLET | Freq: Two times a day (BID) | ORAL | 0 refills | Status: DC

## 2023-08-08 ENCOUNTER — Other Ambulatory Visit: Payer: Self-pay | Admitting: Internal Medicine

## 2023-08-09 NOTE — Telephone Encounter (Signed)
Copied from CRM [id number]. Topic: Clinical - Medical Advice >> Aug 09, 2023  8:49 AM Monisha R wrote: Reason for CRM: Patient wants a call because she has CHF and the medication that she is waiting to be refilled she doesn't like to be without it make her feel uneasy without it.

## 2023-08-09 NOTE — Telephone Encounter (Signed)
Call to patient . Patient advise that Eliquis sent to [facility] on 08/06/2022

## 2023-08-11 ENCOUNTER — Other Ambulatory Visit: Payer: Self-pay

## 2023-08-12 ENCOUNTER — Other Ambulatory Visit: Payer: Self-pay

## 2023-08-13 ENCOUNTER — Telehealth: Payer: Self-pay | Admitting: Pharmacy Technician

## 2023-08-13 ENCOUNTER — Other Ambulatory Visit (HOSPITAL_COMMUNITY): Payer: Self-pay

## 2023-08-13 ENCOUNTER — Other Ambulatory Visit: Payer: Self-pay

## 2023-08-13 NOTE — Telephone Encounter (Signed)
 Pharmacy Patient Advocate Encounter   Received notification from CoverMyMeds that prior authorization for Ubrelvy  100MG  tablets is required/requested.   Insurance verification completed.   The patient is insured through [employer] .   Per test claim: The current 30 day co-pay is, $12.15.  No PA needed at this time. This test claim was processed through [facility]- copay amounts may vary at other pharmacies due to pharmacy/plan contracts, or as the patient moves through the different stages of their insurance plan.

## 2023-08-17 ENCOUNTER — Ambulatory Visit: Payer: Medicare HMO | Admitting: Internal Medicine

## 2023-08-17 ENCOUNTER — Telehealth: Payer: Self-pay | Admitting: Internal Medicine

## 2023-08-17 NOTE — Telephone Encounter (Signed)
PT called wanting to cancel the appt for the PFT results today. Wants [place] to just call and tell them results. I advise it's more than just an appt for the results. Dr. Eulah Citizen want to conduct a full fu and also may have a tx plan for the pt they need to review and which may include new medications. I offered MYCHART. They said they don't have it. I offered to send a link. They dcln and asked me to just call them. Very insistent. I told them I'd cancel appt and let the Dr. know. Their # is [phone]

## 2023-08-18 NOTE — Telephone Encounter (Signed)
PFTs show restriction to ventilation - difficulty getting in a deep breath. Stable lung function compared to 5 years ago.

## 2023-08-18 NOTE — Telephone Encounter (Signed)
Spoke to patient. She is requesting a call from Dr. Celine Mans to discuss PFT results. She canceled 2/11 visit and declined virtual visit.   Dr. Celine Mans, please advise. Thanks

## 2023-08-18 NOTE — Telephone Encounter (Signed)
I called and spoke with pt. Pt informed of Dr Celine Mans message. She verbalized understanding. NFN

## 2023-08-19 ENCOUNTER — Other Ambulatory Visit: Payer: Self-pay

## 2023-08-19 ENCOUNTER — Ambulatory Visit: Payer: Medicare HMO | Attending: Internal Medicine | Admitting: Internal Medicine

## 2023-08-19 DIAGNOSIS — Z7984 Long term (current) use of oral hypoglycemic drugs: Secondary | ICD-10-CM | POA: Diagnosis not present

## 2023-08-19 DIAGNOSIS — I130 Hypertensive heart and chronic kidney disease with heart failure and stage 1 through stage 4 chronic kidney disease, or unspecified chronic kidney disease: Secondary | ICD-10-CM | POA: Diagnosis not present

## 2023-08-19 DIAGNOSIS — E1129 Type 2 diabetes mellitus with other diabetic kidney complication: Secondary | ICD-10-CM

## 2023-08-19 DIAGNOSIS — N1831 Chronic kidney disease, stage 3a: Secondary | ICD-10-CM | POA: Diagnosis not present

## 2023-08-19 DIAGNOSIS — I480 Paroxysmal atrial fibrillation: Secondary | ICD-10-CM

## 2023-08-19 DIAGNOSIS — I5032 Chronic diastolic (congestive) heart failure: Secondary | ICD-10-CM

## 2023-08-19 DIAGNOSIS — I10 Essential (primary) hypertension: Secondary | ICD-10-CM

## 2023-08-19 DIAGNOSIS — E119 Type 2 diabetes mellitus without complications: Secondary | ICD-10-CM

## 2023-08-19 DIAGNOSIS — E1159 Type 2 diabetes mellitus with other circulatory complications: Secondary | ICD-10-CM

## 2023-08-19 DIAGNOSIS — Z6839 Body mass index (BMI) 39.0-39.9, adult: Secondary | ICD-10-CM

## 2023-08-19 DIAGNOSIS — J4540 Moderate persistent asthma, uncomplicated: Secondary | ICD-10-CM

## 2023-08-19 DIAGNOSIS — Z794 Long term (current) use of insulin: Secondary | ICD-10-CM | POA: Diagnosis not present

## 2023-08-19 DIAGNOSIS — Z23 Encounter for immunization: Secondary | ICD-10-CM | POA: Diagnosis not present

## 2023-08-19 DIAGNOSIS — J9611 Chronic respiratory failure with hypoxia: Secondary | ICD-10-CM

## 2023-08-19 DIAGNOSIS — S0591XA Unspecified injury of right eye and orbit, initial encounter: Secondary | ICD-10-CM

## 2023-08-19 DIAGNOSIS — E1169 Type 2 diabetes mellitus with other specified complication: Secondary | ICD-10-CM | POA: Diagnosis not present

## 2023-08-19 LAB — GLUCOSE, POCT (MANUAL RESULT ENTRY): POC Glucose: 206 mg/dL — AB (ref 70–99)

## 2023-08-19 LAB — POCT GLYCOSYLATED HEMOGLOBIN (HGB A1C): HbA1c, POC (controlled diabetic range): 9.7 % — AB (ref 0.0–7.0)

## 2023-08-19 MED ORDER — HUMALOG MIX 75/25 (75-25) 100 UNIT/ML ~~LOC~~ SUSP
SUBCUTANEOUS | 6 refills | Status: DC
  Filled 2023-08-19: qty 30, 30d supply, fill #0
  Filled 2023-09-22: qty 30, 30d supply, fill #1
  Filled 2023-10-18 (×2): qty 30, 30d supply, fill #2
  Filled 2023-11-15 – 2023-11-18 (×2): qty 30, 30d supply, fill #3
  Filled 2023-12-17: qty 30, 30d supply, fill #4
  Filled 2024-01-18 (×2): qty 30, 30d supply, fill #5
  Filled 2024-02-09 – 2024-02-10 (×2): qty 30, 30d supply, fill #6

## 2023-08-19 MED ORDER — ZOSTER VAC RECOMB ADJUVANTED 50 MCG/0.5ML IM SUSR
0.5000 mL | Freq: Once | INTRAMUSCULAR | 0 refills | Status: AC
Start: 2023-08-19 — End: 2023-08-19

## 2023-08-19 MED ORDER — EMPAGLIFLOZIN 10 MG PO TABS
10.0000 mg | ORAL_TABLET | Freq: Every day | ORAL | 1 refills | Status: DC
  Filled 2023-08-19 – 2024-01-18 (×2): qty 90, 90d supply, fill #0
  Filled 2024-04-11 (×2): qty 90, 90d supply, fill #1

## 2023-08-19 MED ORDER — LORATADINE 10 MG PO TABS
10.0000 mg | ORAL_TABLET | Freq: Every day | ORAL | 0 refills | Status: AC | PRN
  Filled 2023-08-19: qty 90, 90d supply, fill #0

## 2023-08-19 MED ORDER — FUROSEMIDE 40 MG PO TABS
40.0000 mg | ORAL_TABLET | Freq: Every day | ORAL | 3 refills | Status: DC
  Filled 2023-08-19: qty 90, 90d supply, fill #0

## 2023-08-19 MED ORDER — HUMALOG MIX 75/25 (75-25) 100 UNIT/ML ~~LOC~~ SUSP
SUBCUTANEOUS | 0 refills | Status: DC
  Filled 2023-08-19: qty 30, 33d supply, fill #0

## 2023-08-19 MED ORDER — UBRELVY 100 MG PO TABS: ORAL_TABLET | ORAL | Status: DC

## 2023-08-19 NOTE — Progress Notes (Signed)
Patient ID: Heidi [name], female    DOB: 05-09-1951  MRN: [id number]  CC: Diabetes (DM f/u. Med refills. /Requesting imaging for throat Valentino Hue to flu vax.)   Subjective: Heidi [name] is a 73 y.o. female who presents for chronic ds management. Her concerns today include:  history of HTN, DM 2 with retinopathy, HL, CKD 3, diastolic CHF, a. Fib (02/2022) hypoxic respiratory failure/cough variant asthma vs moderate persistent asthma 2 L O2 continuously, migraines [facility] on Botox inj), OSA (declines CPAP or repeat sleep study), UACS, GERD, OA knees,  DDD/spondylosis of cervical spine,obesity, renal cyst, LT thyroid nodule.   Discussed the use of AI scribe software for clinical note transcription with the patient, who gave verbal consent to proceed.  History of Present Illness   The patient, with a history of diabetes, congestive heart failure, atrial fibrillation, and asthma, presents for chronic disease management.  DM: Results for orders placed or performed in visit on 08/19/23  POCT glucose (manual entry)   Collection Time: 08/19/23 11:29 AM  Result Value Ref Range   POC Glucose 206 (A) 70 - 99 mg/dl  POCT glycosylated hemoglobin (Hb A1C)   Collection Time: 08/19/23 11:47 AM  Result Value Ref Range   Hemoglobin A1C     HbA1c POC (<> result, manual entry)     HbA1c, POC (prediabetic range)     HbA1c, POC (controlled diabetic range) 9.7 (A) 0.0 - 7.0 %   *Note: Due to a large number of results and/or encounters for the requested time period, some results have not been displayed. A complete set of results can be found in Results Review.   Her HbA1c has increased from 9.4 in July to 9.7. The patient admits to struggling with her eating habits due to the stress of caring for a sick daughter. Despite financial constraints, she does not eat out and cooks at home. She expresses a desire to improve her eating habits but declines the offer to see a nutritionist, believing that  the change must come from within. She is taking Humalog 75/25 insulin (50 units in the morning and 40 units in the evening) and Jardiance 10mg  daily. She checks her blood sugars three to four times a day, reporting readings in the 200s and 300s. Did not bring log. She expresses fear of hypoglycemia and admits to eating candy to prevent low blood sugar levels.  I have recommended continuous glucose monitor for her previously but patient declined.  The patient also mentions a recent eye irritation due to an incident with her dog. The eye is red but not painful or sensitive to light.   HTN/CHF/atrial fibrillation: reports no swelling in the legs, or chest pains.  She is taking furosemide, Jardiance, diltiazem 120 mg daily, atenolol 25 mg daily Cozaar 50 mg daily and potassium 20 mEq daily. She is also taking Eliquis for atrial fibrillation and reports no bleeding.   Her asthma is managed with daily Symbicort and she uses oxygen at home, especially at night. She has a portable oxygen unit for use when needed.  She does not have her portable oxygen with her today.  She checks her pulse ox intermittently at home.  CKD 3: Last GFR was 55.  Not on NSAIDs.   Patient Active Problem List   Diagnosis Date Noted   Severe persistent chronic asthma without complication 09/21/2022   Stage 3b chronic kidney disease (HCC) 09/21/2022   Chronic diastolic CHF (congestive heart failure) (HCC) 03/18/2022   PAF (  paroxysmal atrial fibrillation) (HCC)    Unstable angina (HCC)    Chronic respiratory failure with hypoxia (HCC) 03/03/2022   Moderate persistent asthma 03/03/2022   Migraines 03/03/2022   Moderate persistent asthma without complication 09/04/2021   History of colonic polyps    Benign neoplasm of ascending colon    Benign neoplasm of transverse colon    Benign neoplasm of descending colon    Postural dizziness with presyncope 02/10/2021   Acute encephalopathy 01/27/2021   Fall at home, initial encounter  01/27/2021   Pneumonia due to COVID-19 virus 08/10/2020   Encephalopathy due to COVID-19 virus 08/10/2020   Acute kidney injury superimposed on CKD (HCC) 08/10/2020   COVID 08/10/2020   Encounter for medication review and counseling 03/18/2020   Medication management 02/28/2020   Exertional dyspnea 01/03/2020   Breast nodule 11/24/2019   Chronic respiratory failure with hypoxia, on home oxygen therapy (HCC) 11/13/2019   Chest pain 08/24/2019   Diabetic retinopathy of right eye associated with type 2 diabetes mellitus (HCC) 08/16/2018   Upper airway cough syndrome 05/03/2018   Morbid obesity due to excess calories (HCC) complicated by hbp/ dm / hyperlipidemia 09/10/2017   Cough variant asthma vs UACS/vcd on ACEi  09/09/2017   Anxiety and depression 09/06/2017   Mild persistent asthma without complication 09/06/2017   Paranoia (HCC) 09/06/2017   Torn left ear lobe 11/20/2016   Heart failure with preserved ejection fraction (HCC) 06/27/2016   Post-menopausal atrophic vaginitis 11/14/2015   Primary osteoarthritis of both knees 07/18/2015   GERD (gastroesophageal reflux disease) 05/08/2015   Renal cyst 05/08/2015   Adenomatous polyp of colon 03/26/2015   Essential hypertension 02/12/2015   Cognitive deficit due to old head trauma 02/12/2015   Chronic left shoulder pain 02/11/2015   Thyroid nodule 12/18/2014   Chronic neck pain    HLD (hyperlipidemia)    OSA treated with BiPAP 11/27/2014   Obesity hypoventilation syndrome (HCC) 11/27/2014   Demand ischemia (HCC)    Uncontrolled type 2 diabetes mellitus with hyperglycemia (HCC)    Cardiomegaly 11/25/2014   DM type 2 causing CKD stage 2 (HCC) 11/25/2014     Current Outpatient Medications on File Prior to Visit  Medication Sig Dispense Refill   Accu-Chek Softclix Lancets lancets Use as directed. To check blood sugars 4x/day ICD E11.69 100 each 10   acetaminophen (TYLENOL) 500 MG tablet Take 500-1,000 mg by mouth every 6 (six) hours  as needed (pain).     albuterol (PROVENTIL) (2.5 MG/3ML) 0.083% nebulizer solution Take 3 mLs (2.5 mg total) by nebulization every 6 (six) hours as needed for wheezing or shortness of breath. 75 mL 5   albuterol (VENTOLIN HFA) 108 (90 Base) MCG/ACT inhaler Inhale 1-2 puffs into the lungs every 4 (four) hours as needed for wheezing or shortness of breath. 8 g 5   Alcohol Swabs (DROPSAFE ALCOHOL PREP) 70 % PADS USE AS DIRECTED 300 each 3   apixaban (ELIQUIS) 5 MG TABS tablet Take 1 tablet (5 mg total) by mouth 2 (two) times daily. 180 tablet 0   atenolol (TENORMIN) 25 MG tablet Take 1 tablet (25 mg total) by mouth daily. 90 tablet 2   atorvastatin (LIPITOR) 40 MG tablet TAKE 1 TABLET BY MOUTH EVERY DAY 90 tablet 2   Blood Glucose Calibration (ACCU-CHEK GUIDE CONTROL) LIQD L1-L2 Control Solution. UAD 1 each 1   Blood Glucose Monitoring Suppl (ACCU-CHEK GUIDE) w/Device KIT UAD 1 kit 0   budesonide-formoterol (SYMBICORT) 80-4.5 MCG/ACT inhaler Inhale 2 puffs  into the lungs in the morning and at bedtime. 1 each 5   diclofenac Sodium (VOLTAREN) 1 % GEL Apply 2 g topically every 8 (eight) hours as needed. 100 g 1   diltiazem (CARDIZEM CD) 120 MG 24 hr capsule TAKE 1 CAPSULE(120 MG) BY MOUTH DAILY 90 capsule 3   glucose blood (ACCU-CHEK GUIDE) test strip Test blood sugars 3-4 times a day with meals and at bedtime. E11.69 100 each 12   Insulin Syringe-Needle U-100 (INSULIN SYRINGE 1CC/31GX5/16") 31G X 5/16" 1 ML MISC Use to inject Humalog 75/25 mix twice daily 200 each 6   losartan (COZAAR) 50 MG tablet TAKE 1 TABLET EVERY DAY 90 tablet 0   montelukast (SINGULAIR) 10 MG tablet Take 1 tablet (10 mg total) by mouth at bedtime. 90 tablet 2   Multiple Vitamin (MULTIVITAMIN WITH MINERALS) TABS tablet Take 1 tablet by mouth in the morning. Centrum Silver     nitroGLYCERIN (NITROSTAT) 0.3 MG SL tablet DISSOLVE 1 TABLET UNDER THE  TONGUE EVERY 5 MINUTES AS NEEDED FOR CHEST PAIN. MAX OF 3 TABLETS IN 15 MINUTES. CALL  911 IF PAIN  PERSISTS. Strength: 0.3 mg 200 tablet 2   OXYGEN Inhale 3 L into the lungs continuous.     pantoprazole (PROTONIX) 20 MG tablet Take 1 tablet (20 mg total) by mouth daily. 30 tablet 6   potassium chloride SA (KLOR-CON M) 20 MEQ tablet TAKE 1 TABLET EVERY DAY 90 tablet 0   Pseudoeph-Doxylamine-DM-APAP (NYQUIL PO) Take by mouth as needed.     tiZANidine (ZANAFLEX) 2 MG tablet TAKE 1 TABLET BY MOUTH DAILY AS NEEDED FOR MUSCLE SPASM- MUST LAST ONE MONTH 30 tablet 1   topiramate (TOPAMAX) 25 MG tablet TAKE 1 TABLET (25 MG) WITH TOPIRAMATE 50 MG TABLET BY MOUTH TWICE DAILY FOR TOTAL DOSE OF 75 MG TWICE DAILY. 180 tablet 0   topiramate (TOPAMAX) 50 MG tablet TAKE 1 TABLET (50 MG) WITH 25 MG TABLET BY MOUTH TWICE DAILY FOR TOTAL DOSE OF 75 MG TWICE DAILY. 180 tablet 0   No current facility-administered medications on file prior to visit.    Allergies  Allergen Reactions   Januvia [Sitagliptin]     Chest pains   Tramadol Other (See Comments)    hallucinations    Social History   Socioeconomic History   Marital status: Widowed    Spouse name: Not on file   Number of children: 4   Years of education: Not on file   Highest education level: Not on file  Occupational History   Occupation: Disabled  Tobacco Use   Smoking status: Never    Passive exposure: Never   Smokeless tobacco: Never  Vaping Use   Vaping status: Never Used  Substance and Sexual Activity   Alcohol use: No   Drug use: No   Sexual activity: Yes  Other Topics Concern   Not on file  Social History Narrative   Lives with daughter   Caffeine use: 1 cup coffee per day   Social Drivers of Health   Financial Resource Strain: Medium Risk (08/19/2023)   Overall Financial Resource Strain (CARDIA)    Difficulty of Paying Living Expenses: Somewhat hard  Food Insecurity: Food Insecurity Present (08/19/2023)   Hunger Vital Sign    Worried About Running Out of Food in the Last Year: Sometimes true    Ran Out of  Food in the Last Year: Sometimes true  Transportation Needs: No Transportation Needs (08/19/2023)   PRAPARE - Transportation  Lack of Transportation (Medical): No    Lack of Transportation (Non-Medical): No  Physical Activity: Inactive (08/19/2023)   Exercise Vital Sign    Days of Exercise per Week: 0 days    Minutes of Exercise per Session: 0 min  Stress: No Stress Concern Present (08/19/2023)   [employer] of Occupational Health - Occupational Stress Questionnaire    Feeling of Stress : Only a little  Social Connections: Moderately Integrated (08/19/2023)   Social Connection and Isolation Panel [NHANES]    Frequency of Communication with Friends and Family: More than three times a week    Frequency of Social Gatherings with Friends and Family: Once a week    Attends Religious Services: More than 4 times per year    Active Member of [employer] or Organizations: Yes    Attends Banker Meetings: More than 4 times per year    Marital Status: Divorced  Intimate Partner Violence: Not At Risk (08/19/2023)   Humiliation, Afraid, Rape, and Kick questionnaire    Fear of Current or Ex-Partner: No    Emotionally Abused: No    Physically Abused: No    Sexually Abused: No    Family History  Problem Relation Age of Onset   Colon cancer Father    Diabetes type II Sister    Colon cancer Sister    Diabetes type II Brother    Migraines Neg Hx    Breast cancer Neg Hx     Past Surgical History:  Procedure Laterality Date   ABDOMINAL HYSTERECTOMY     CATARACT EXTRACTION Left    CATARACT EXTRACTION     LT 01/2017, 02/2017 on RT   CESAREAN SECTION     COLONOSCOPY WITH PROPOFOL N/A 04/15/2021   Procedure: COLONOSCOPY WITH PROPOFOL;  Surgeon: Hilarie Fredrickson, MD;  Location: [facility];  Service: Endoscopy;  Laterality: N/A;  patient is on O2   JOINT REPLACEMENT Left    Plate in Ankle   LEFT HEART CATH AND CORONARY ANGIOGRAPHY N/A 03/04/2022   Procedure: LEFT HEART CATH AND CORONARY  ANGIOGRAPHY;  Surgeon: Elder Negus, MD;  Location: [facility];  Service: Cardiovascular;  Laterality: N/A;   POLYPECTOMY  04/15/2021   Procedure: POLYPECTOMY;  Surgeon: Hilarie Fredrickson, MD;  Location: [facility];  Service: Endoscopy;;    ROS: Review of Systems Negative except as stated above  PHYSICAL EXAM: BP 107/67 (BP Location: Left Arm, Patient Position: Sitting, Cuff Size: Large)   Pulse 70   Temp 98.1 F (36.7 C) (Oral)   Ht 5\' 4"  (1.626 m)   Wt 228 lb (103.4 kg)   SpO2 93%   BMI 39.14 kg/m   Wt Readings from Last 3 Encounters:  08/19/23 228 lb (103.4 kg)  03/23/23 226 lb (102.5 kg)  01/25/23 226 lb (102.5 kg)    Physical Exam  General appearance - alert, well appearing, and in no distress Mental status - normal mood, behavior, speech, dress, motor activity, and thought processes Eyes -right eye: Mild to moderate erythema, no drainage noted.  Good extraocular movement. Neck - supple, no significant adenopathy Chest -breath sounds slightly decreased bilaterally.  No wheezes or crackles heard. Heart - normal rate, regular rhythm, normal S1, S2, no murmurs, rubs, clicks or gallops Extremities -no lower extremity edema.      Latest Ref Rng & Units 01/21/2023    3:31 PM 09/21/2022    5:19 PM 03/07/2022    4:06 AM  CMP  Glucose 70 - 99  mg/dL 474  259  563   BUN 8 - 27 mg/dL 14  13  28    Creatinine 0.57 - 1.00 mg/dL 8.75  6.43  3.29   Sodium 134 - 144 mmol/L 145  146  142   Potassium 3.5 - 5.2 mmol/L 4.0  4.1  4.0   Chloride 96 - 106 mmol/L 103  107  100   CO2 20 - 29 mmol/L 27  24  31    Calcium 8.7 - 10.3 mg/dL 9.4  9.6  9.0   Total Protein 6.0 - 8.5 g/dL  7.0    Total Bilirubin 0.0 - 1.2 mg/dL  <5.1    Alkaline Phos 44 - 121 IU/L  116    AST 0 - 40 IU/L  20    ALT 0 - 32 IU/L  17     Lipid Panel     Component Value Date/Time   CHOL 125 09/21/2022 1719   TRIG 148 09/21/2022 1719   HDL 63 09/21/2022 1719   CHOLHDL 2.0 09/21/2022 1719    CHOLHDL 2.6 01/28/2021 0523   VLDL 30 01/28/2021 0523   LDLCALC 37 09/21/2022 1719    CBC    Component Value Date/Time   WBC 8.9 03/07/2022 0239   RBC 4.76 03/07/2022 0239   HGB 13.4 03/07/2022 0239   HGB 13.3 08/30/2020 1141   HCT 43.0 03/07/2022 0239   HCT 41.9 08/30/2020 1141   PLT 229 03/07/2022 0239   PLT 128 (L) 08/30/2020 1141   MCV 90.3 03/07/2022 0239   MCV 88 08/30/2020 1141   MCH 28.2 03/07/2022 0239   MCHC 31.2 03/07/2022 0239   RDW 15.3 03/07/2022 0239   RDW 15.2 08/30/2020 1141   LYMPHSABS 1.7 03/03/2022 0551   LYMPHSABS 4.3 (H) 08/23/2017 1655   MONOABS 0.3 03/03/2022 0551   EOSABS 0.2 03/03/2022 0551   EOSABS 0.4 08/23/2017 1655   BASOSABS 0.0 03/03/2022 0551   BASOSABS 0.1 08/23/2017 1655    ASSESSMENT AND PLAN: 1. Type 2 diabetes mellitus with morbid obesity (HCC) (Primary) Not at goal.  Patient attributes this to dietary indiscretions.  She plans to work on her eating habits. Discussed increasing Jardiance to 25 mg daily versus increasing her insulin.  She declined GLP-1 agents.  She prefers to increase the insulin dose.  I recommend increasing the Humalog 75/25 to 55 units in the morning and 45 units in the evening. - POCT glycosylated hemoglobin (Hb A1C) - POCT glucose (manual entry) - CBC - Comprehensive metabolic panel - Lipid panel - Microalbumin / creatinine urine ratio - insulin lispro protamine-lispro (HUMALOG MIX 75/25) (75-25) 100 UNIT/ML SUSP injection; Inject 0.55 mLs (55 Units total) into the skin every morning AND 0.45 mLs (45 Units total) every evening.  Dispense: 30 mL; Refill: 6  2. Insulin long-term use (HCC) 3. Diabetes mellitus treated with oral medication (HCC) See #1 above  4. Chronic diastolic heart failure (HCC) Stable and compensated.  Continue furosemide and Jardiance  5. Essential hypertension At goal.  Continue atenolol 25 mg daily, Cardizem 120 mg daily, Cozaar 50 mg daily  6. Moderate persistent asthma without  complication Doing well on Symbicort.  7. Chronic respiratory failure with hypoxia (HCC) On home O2 which she uses as needed and at nights.  8. Paroxysmal atrial fibrillation (HCC) Continue Eliquis.  9. Stage 3a chronic kidney disease (HCC) Continue to avoid NSAIDs.  Check chemistry today.  10. Right eye injury, initial encounter I recommend warm compresses to the eye.  If  she develops any pain or drainage from the eye she should follow-up or call her eye doctor to try to get in as soon as possible  11. Need for shingles vaccine Printed prescription given to patient to take to her pharmacy - Zoster Vaccine Adjuvanted [facility]) injection; Inject 0.5 mLs into the muscle once for 1 dose.  Dispense: 0.5 mL; Refill: 0  12. Encounter for immunization - Flu Vaccine Trivalent High Dose (Fluad)   Patient was given the opportunity to ask questions.  Patient verbalized understanding of the plan and was able to repeat key elements of the plan.   This documentation was completed using Paediatric nurse.  Any transcriptional errors are unintentional.  Orders Placed This Encounter  Procedures   Flu Vaccine Trivalent High Dose (Fluad)   CBC   Comprehensive metabolic panel   Lipid panel   Microalbumin / creatinine urine ratio   POCT glycosylated hemoglobin (Hb A1C)   POCT glucose (manual entry)     Requested Prescriptions   Signed Prescriptions Disp Refills   loratadine (CLARITIN) 10 MG tablet 90 tablet 0    Sig: Take 1 tablet (10 mg total) by mouth daily as needed for allergies.   furosemide (LASIX) 40 MG tablet 90 tablet 3    Sig: Take 1 tablet (40 mg total) by mouth daily.   empagliflozin (JARDIANCE) 10 MG TABS tablet 90 tablet 1    Sig: Take 1 tablet (10 mg total) by mouth daily.   Ubrogepant (UBRELVY) 100 MG TABS      Sig: Take 1 tablet onset migraine, may take another tablet if needed 2 hours later. (2 tab max/daily)   Zoster Vaccine Adjuvanted (SHINGRIX)  injection 0.5 mL 0    Sig: Inject 0.5 mLs into the muscle once for 1 dose.   insulin lispro protamine-lispro (HUMALOG MIX 75/25) (75-25) 100 UNIT/ML SUSP injection 30 mL 6    Sig: Inject 0.55 mLs (55 Units total) into the skin every morning AND 0.45 mLs (45 Units total) every evening.    Return in about 4 months (around 12/17/2023).  Jonah Blue, MD, FACP

## 2023-08-20 ENCOUNTER — Other Ambulatory Visit: Payer: Self-pay

## 2023-08-20 ENCOUNTER — Telehealth: Payer: Self-pay

## 2023-08-20 NOTE — Telephone Encounter (Signed)
Copied from CRM [id number]. Topic: General - Other >> Aug 20, 2023  4:41 PM Eunice Blase wrote: Reason for CRM: Pt retuning call regarding Lab results, please call pt at [phone].

## 2023-08-21 LAB — COMPREHENSIVE METABOLIC PANEL
ALT: 21 [IU]/L (ref 0–32)
AST: 17 [IU]/L (ref 0–40)
Albumin: 4.3 g/dL (ref 3.8–4.8)
Alkaline Phosphatase: 154 [IU]/L — ABNORMAL HIGH (ref 44–121)
BUN/Creatinine Ratio: 10 — ABNORMAL LOW (ref 12–28)
BUN: 11 mg/dL (ref 8–27)
Bilirubin Total: 0.2 mg/dL (ref 0.0–1.2)
CO2: 24 mmol/L (ref 20–29)
Calcium: 9.6 mg/dL (ref 8.7–10.3)
Chloride: 105 mmol/L (ref 96–106)
Creatinine, Ser: 1.11 mg/dL — ABNORMAL HIGH (ref 0.57–1.00)
Globulin, Total: 2.1 g/dL (ref 1.5–4.5)
Glucose: 168 mg/dL — ABNORMAL HIGH (ref 70–99)
Potassium: 4.4 mmol/L (ref 3.5–5.2)
Sodium: 143 mmol/L (ref 134–144)
Total Protein: 6.4 g/dL (ref 6.0–8.5)
eGFR: 53 mL/min/{1.73_m2} — ABNORMAL LOW (ref >59)

## 2023-08-21 LAB — LIPID PANEL
Chol/HDL Ratio: 2.3 {ratio} (ref 0.0–4.4)
Cholesterol, Total: 117 mg/dL (ref 100–199)
HDL: 50 mg/dL (ref >39)
LDL Chol Calc (NIH): 40 mg/dL (ref 0–99)
Triglycerides: 165 mg/dL — ABNORMAL HIGH (ref 0–149)
VLDL Cholesterol Cal: 27 mg/dL (ref 5–40)

## 2023-08-21 LAB — CBC
Hematocrit: 42.5 % (ref 34.0–46.6)
Hemoglobin: 13.7 g/dL (ref 11.1–15.9)
MCH: 28.2 pg (ref 26.6–33.0)
MCHC: 32.2 g/dL (ref 31.5–35.7)
MCV: 87 fL (ref 79–97)
Platelets: 210 10*3/uL (ref 150–450)
RBC: 4.86 x10E6/uL (ref 3.77–5.28)
RDW: 14.1 % (ref 11.7–15.4)
WBC: 8.8 10*3/uL (ref 3.4–10.8)

## 2023-08-21 LAB — MICROALBUMIN / CREATININE URINE RATIO
Creatinine, Urine: 45.5 mg/dL
Microalb/Creat Ratio: 7 mg/g{creat} (ref 0–29)
Microalbumin, Urine: 3 ug/mL

## 2023-08-23 ENCOUNTER — Other Ambulatory Visit: Payer: Self-pay

## 2023-08-23 NOTE — Telephone Encounter (Signed)
Called patient, DOB was confined and results given

## 2023-08-24 ENCOUNTER — Other Ambulatory Visit: Payer: Self-pay | Admitting: Internal Medicine

## 2023-08-24 ENCOUNTER — Other Ambulatory Visit: Payer: Self-pay

## 2023-08-24 ENCOUNTER — Other Ambulatory Visit: Payer: Self-pay | Admitting: Cardiology

## 2023-08-24 ENCOUNTER — Telehealth: Payer: Self-pay

## 2023-08-24 NOTE — Telephone Encounter (Signed)
We did not talk about a medication for digestion on her last visit.  However I see pantoprazole on her medication list.  Did she need a refill on this?

## 2023-08-24 NOTE — Telephone Encounter (Signed)
 Please Advise

## 2023-08-24 NOTE — Telephone Encounter (Unsigned)
Copied from CRM [id number]. Topic: Clinical - Medication Refill >> Aug 24, 2023  3:29 PM Prudencio Pair wrote: Most Recent Primary Care Visit:  Provider: Jonah Blue B  Department: [facility]  Visit Type: OFFICE VISIT  Date: 08/19/2023  Medication: apixaban (ELIQUIS) 5 MG TABS tablet  Has the patient contacted their pharmacy?  (Agent: If no, request that the patient contact the pharmacy for the refill. If patient does not wish to contact the pharmacy document the reason why and proceed with request.) (Agent: If yes, when and what did the pharmacy advise?)  Is this the correct pharmacy for this prescription? Yes If no, delete pharmacy and type the correct one.  This is the patient's preferred pharmacy:   [facility] [address] [place] [address] Phone: [phone] Fax: [phone]   Has the prescription been filled recently? {yes/no:20286}  Is the patient out of the medication? {yes/no:20286}  Has the patient been seen for an appointment in the last year OR does the patient have an upcoming appointment? {yes/no:20286}  Can we respond through MyChart? {yes/no:20286}  Agent: Please be advised that Rx refills may take up to 3 business days. We ask that you follow-up with your pharmacy.

## 2023-08-24 NOTE — Telephone Encounter (Signed)
Patient stated that she talked with you about a medication for digestion in last visit and is wanting it sent to our pharmacy

## 2023-08-24 NOTE — Telephone Encounter (Signed)
 Called but no answer. LVM to call back.

## 2023-08-24 NOTE — Telephone Encounter (Signed)
Copied from CRM [id number]. Topic: Clinical - Medication Question >> Aug 24, 2023  1:35 PM Prudencio Pair wrote: Reason for CRM: Patient was returning call. Called CAL & was told that Carly was at lunch and to just send a CRM. Advised the pt that Carly was calling in regards to pt stating she spoke with Dr. Laural Benes about a medication for digestion. Patient states she never talked to Dr. Laural Benes about the medication. She did state that she was wanting to see if Dr. Laural Benes could prescribe her famotidine because the pantoprazole gripes her stomach and have her stomach upset. States she had some famotidine previously and it helped her. Please give patient a call to let her know if Dr. Laural Benes is able to prescribe the famotidine for her. CB #: [phone].

## 2023-08-25 ENCOUNTER — Other Ambulatory Visit: Payer: Self-pay

## 2023-08-25 MED ORDER — APIXABAN 5 MG PO TABS
5.0000 mg | ORAL_TABLET | Freq: Two times a day (BID) | ORAL | 1 refills | Status: DC
  Filled 2023-08-25 – 2023-11-12 (×3): qty 180, 90d supply, fill #0
  Filled 2024-01-31: qty 180, 90d supply, fill #1

## 2023-08-25 MED ORDER — FAMOTIDINE 20 MG PO TABS
20.0000 mg | ORAL_TABLET | Freq: Every day | ORAL | 1 refills | Status: AC
Start: 2023-08-25 — End: ?

## 2023-08-25 NOTE — Telephone Encounter (Signed)
Patient called back and stated that she does not want pantoprazole & is requesting famotidine. Called & spoke to the patient. Informed that a refill has been sent to the pharmacy. Patient expressed verbal understanding. Please see telephone encounter dates 08/24/2023 for further information.

## 2023-08-25 NOTE — Telephone Encounter (Signed)
Called & spoke to the patient. Informed that a refill has been sent to the pharmacy. Patient expressed verbal understanding.

## 2023-08-25 NOTE — Addendum Note (Signed)
Addended by: Jonah Blue B on: 08/25/2023 10:13 AM   Modules accepted: Orders

## 2023-08-25 NOTE — Telephone Encounter (Signed)
Prescription for famotidine sent to her pharmacy, [facility].

## 2023-08-26 ENCOUNTER — Other Ambulatory Visit: Payer: Self-pay

## 2023-08-30 DIAGNOSIS — E103511 Type 1 diabetes mellitus with proliferative diabetic retinopathy with macular edema, right eye: Secondary | ICD-10-CM | POA: Diagnosis not present

## 2023-08-30 DIAGNOSIS — H04123 Dry eye syndrome of bilateral lacrimal glands: Secondary | ICD-10-CM | POA: Diagnosis not present

## 2023-08-30 DIAGNOSIS — E103492 Type 1 diabetes mellitus with severe nonproliferative diabetic retinopathy without macular edema, left eye: Secondary | ICD-10-CM | POA: Diagnosis not present

## 2023-08-30 DIAGNOSIS — H401112 Primary open-angle glaucoma, right eye, moderate stage: Secondary | ICD-10-CM | POA: Diagnosis not present

## 2023-08-30 LAB — HM DIABETES EYE EXAM

## 2023-08-31 ENCOUNTER — Ambulatory Visit: Payer: Medicare HMO | Admitting: Cardiology

## 2023-08-31 NOTE — Progress Notes (Deleted)
 Cardiology Office Note:  .   Date:  08/31/2023  ID:  Heidi [name], DOB Aug 16, 1950, MRN [id number] PCP: Marcine Matar, MD  [facility] Providers Cardiologist:  Truett Mainland, MD PCP: Marcine Matar, MD  No chief complaint on file.     History of Present Illness: .    Heidi [name] is a 73 y.o. female with hypertension,  hyperlipidemia, type 2 DM with retinopathy, PAF, moderate persistent asthma, chronic hypoxic respiratory failure, CKD stage III, OSA, obesity, breast nodule, chronic migraines   ***  There were no vitals filed for this visit.   ROS: *** ROS   Studies Reviewed: .        *** Independently interpreted 08/2023: Chol ***, TG ***, HDL ***, LDL *** HbA1C ***% Hb *** Cr *** ***  Mobile cardiac telemetry 10 days 03/16/2022 - 03/26/2022: Dominant rhythm: Sinus. HR 51-109 bpm. Avg HR 74 bpm, in sinus rhythm. 42% Afib burden, ventricular rate 65-152 bpm (avg of 99 bpm).  0 episodes of SVT. <1% isolated SVE, couplets. 0 episodes of VT. <1% isolated VE, no couplet/triplets. No SVT/VT/high grade AV block, sinus pause >3sec noted. 2 patient triggered events, correlated with Afib.   Coronary angiography 03/04/2022: LM: Normal LAD: Medial calcification, minimal luminal irregularities Lcx: Normal RCA: Normal   Mildly elevated LVEDP   Complex procedure due to severe tortuosity requiring the use of long sheath and multiple catheters.   No obstructive CAD. Given nitrate responsive pain, consider using long acting nitrate, as it could help with coronary vasospasm or endothelial dysfunction.     Echocardiogram 03/03/2022:  1. Left ventricular ejection fraction, by estimation, is 60 to 65%. The  left ventricle has normal function. The left ventricle has no regional  wall motion abnormalities. There is moderate left ventricular hypertrophy.  Left ventricular diastolic  parameters were normal.   2. Right ventricular systolic function is  normal. The right ventricular  size is normal.   3. The mitral valve is normal in structure. No evidence of mitral valve  regurgitation. No evidence of mitral stenosis.   4. The aortic valve is normal in structure. Aortic valve regurgitation is  not visualized. No aortic stenosis is present.   Comparison(s): Changes from prior study are noted. LVH is increased from  mild to moderate. Tribial pericardial effusion is new.    CT Chest 11/24/2019: 1. No evidence for pulmonary embolus. 2. Cardiomegaly with hazy bilateral ground-glass airspace opacities primarily at the lung bases, suggestive of an atypical infectious process such as viral pneumonia. 3. Scattered bilateral breast nodules measuring up to approximately 8 mm on the left. Correlation with outpatient mammography is recommended. 4. Partially calcified 1.5 cm left-sided thyroid nodule. Follow-up with a nonemergent outpatient thyroid ultrasound is recommended for further evaluation if this has not already been performed.(Ref: J Am Coll Radiol. 2015 Feb;12(2): 143-50).     Recent labs: 09/21/2022: Glucose 128, BUN/Cr 13/1.15. EGFR 51. Na/K 146/4.1. Rest of the CMP normal Chol 125, TG 148, HDL 63, LDL 37   03/07/2022: Glucose 147, BUN/Cr 28/1.26. EGFR 46. Na/K 142/4.0. Rest of the CMP normal H/H 13/43. MCV 90. Platelets 229 Lipoprotein (a) 81  11/2021: HbA1C 9.9%  Risk Assessment/Calculations:   {Does this patient have ATRIAL FIBRILLATION?[phone]}     Physical Exam:   Physical Exam   VISIT DIAGNOSES: No diagnosis found.   ASSESSMENT AND PLAN: .    Heidi [name] is a 73 y.o. female with hypertension,  hyperlipidemia, type 2 DM  with retinopathy, PAF, moderate persistent asthma, chronic hypoxic respiratory failure, CKD stage III, OSA, obesity, breast nodule, chronic migraines   *** PAF:  Currently in sinus rhythm. No over symptoms of palpitations. Multiple risk factors. Rate control approach  reasonable. Continue diltiazem, atenolol CHA2DS2VASc score 4, annual score risk 4.8%. Continue Eliquis 5 mg bid.    Hypertension:  Controlled  {Are you ordering a CV Procedure (e.g. stress test, cath, DCCV, TEE, etc)?   Press F2        :[id number]}    No orders of the defined types were placed in this encounter.    F/u in ***  Signed, Elder Negus, MD

## 2023-09-01 ENCOUNTER — Ambulatory Visit: Payer: Medicare HMO | Admitting: Cardiology

## 2023-09-03 ENCOUNTER — Other Ambulatory Visit: Payer: Self-pay | Admitting: Internal Medicine

## 2023-09-03 DIAGNOSIS — I10 Essential (primary) hypertension: Secondary | ICD-10-CM

## 2023-09-06 ENCOUNTER — Other Ambulatory Visit: Payer: Self-pay

## 2023-09-07 ENCOUNTER — Other Ambulatory Visit: Payer: Self-pay

## 2023-09-13 ENCOUNTER — Other Ambulatory Visit: Payer: Self-pay

## 2023-09-14 DIAGNOSIS — H401111 Primary open-angle glaucoma, right eye, mild stage: Secondary | ICD-10-CM | POA: Diagnosis not present

## 2023-09-14 DIAGNOSIS — H04123 Dry eye syndrome of bilateral lacrimal glands: Secondary | ICD-10-CM | POA: Diagnosis not present

## 2023-09-14 DIAGNOSIS — Z961 Presence of intraocular lens: Secondary | ICD-10-CM | POA: Diagnosis not present

## 2023-09-14 DIAGNOSIS — E113211 Type 2 diabetes mellitus with mild nonproliferative diabetic retinopathy with macular edema, right eye: Secondary | ICD-10-CM | POA: Diagnosis not present

## 2023-09-16 ENCOUNTER — Encounter (HOSPITAL_COMMUNITY): Payer: Self-pay

## 2023-09-16 ENCOUNTER — Ambulatory Visit (HOSPITAL_COMMUNITY)
Admission: EM | Admit: 2023-09-16 | Discharge: 2023-09-16 | Disposition: A | Discharge status: Home / Self Care | Attending: Nurse Practitioner | Admitting: Nurse Practitioner

## 2023-09-16 DIAGNOSIS — B9689 Other specified bacterial agents as the cause of diseases classified elsewhere: Secondary | ICD-10-CM

## 2023-09-16 DIAGNOSIS — J069 Acute upper respiratory infection, unspecified: Secondary | ICD-10-CM

## 2023-09-16 MED ORDER — AMOXICILLIN-POT CLAVULANATE 875-125 MG PO TABS: 1.0000 | ORAL_TABLET | Freq: Two times a day (BID) | ORAL | 0 refills | Status: AC

## 2023-09-16 NOTE — ED Provider Notes (Signed)
 [facility]    CSN: [id number] Arrival date & time: 09/16/23  1135      History   Chief Complaint Chief Complaint  Patient presents with   Otalgia    HPI Heidi [name] is a 73 y.o. female.   Patient presents today with more than 1 week history of chills and sweats, tactile fever, congested cough, chest pain with coughing, runny and stuffy nose, headache, stuffy left ear and right ear ache.  Also endorses fatigue and generalized weakness.  No shortness of breath, abdominal pain, nausea/vomiting, or diarrhea.  Has been taking Mucinex and cough syrup which does help the congestion.  She is concerned she has an ear infection.    Past Medical History:  Diagnosis Date   Adenomatous colon polyp    Anemia    Anxiety    Asthma    CHF (congestive heart failure) (HCC)    COPD (chronic obstructive pulmonary disease) (HCC)    Diabetes mellitus without complication (HCC)    High cholesterol    Hypertension    Left knee injury    Ocular hypertension 08/12/2022   OD   Pneumonia    Sleep apnea    Supplemental oxygen dependent    2L [place]    Patient Active Problem List   Diagnosis Date Noted   Severe persistent chronic asthma without complication 09/21/2022   Stage 3b chronic kidney disease (HCC) 09/21/2022   Chronic diastolic CHF (congestive heart failure) (HCC) 03/18/2022   PAF (paroxysmal atrial fibrillation) (HCC)    Unstable angina (HCC)    Chronic respiratory failure with hypoxia (HCC) 03/03/2022   Moderate persistent asthma 03/03/2022   Migraines 03/03/2022   Moderate persistent asthma without complication 09/04/2021   History of colonic polyps    Benign neoplasm of ascending colon    Benign neoplasm of transverse colon    Benign neoplasm of descending colon    Postural dizziness with presyncope 02/10/2021   Acute encephalopathy 01/27/2021   Fall at home, initial encounter 01/27/2021   Pneumonia due to COVID-19 virus 08/10/2020   Encephalopathy due to  COVID-19 virus 08/10/2020   Acute kidney injury superimposed on CKD (HCC) 08/10/2020   COVID 08/10/2020   Encounter for medication review and counseling 03/18/2020   Medication management 02/28/2020   Exertional dyspnea 01/03/2020   Breast nodule 11/24/2019   Chronic respiratory failure with hypoxia, on home oxygen therapy (HCC) 11/13/2019   Chest pain 08/24/2019   Diabetic retinopathy of right eye associated with type 2 diabetes mellitus (HCC) 08/16/2018   Upper airway cough syndrome 05/03/2018   Morbid obesity due to excess calories (HCC) complicated by hbp/ dm / hyperlipidemia 09/10/2017   Cough variant asthma vs UACS/vcd on ACEi  09/09/2017   Anxiety and depression 09/06/2017   Mild persistent asthma without complication 09/06/2017   Paranoia (HCC) 09/06/2017   Torn left ear lobe 11/20/2016   Heart failure with preserved ejection fraction (HCC) 06/27/2016   Post-menopausal atrophic vaginitis 11/14/2015   Primary osteoarthritis of both knees 07/18/2015   GERD (gastroesophageal reflux disease) 05/08/2015   Renal cyst 05/08/2015   Adenomatous polyp of colon 03/26/2015   Essential hypertension 02/12/2015   Cognitive deficit due to old head trauma 02/12/2015   Chronic left shoulder pain 02/11/2015   Thyroid nodule 12/18/2014   Chronic neck pain    HLD (hyperlipidemia)    OSA treated with BiPAP 11/27/2014   Obesity hypoventilation syndrome (HCC) 11/27/2014   Demand ischemia (HCC)    Uncontrolled type 2  diabetes mellitus with hyperglycemia (HCC)    Cardiomegaly 11/25/2014   DM type 2 causing CKD stage 2 (HCC) 11/25/2014    Past Surgical History:  Procedure Laterality Date   ABDOMINAL HYSTERECTOMY     CATARACT EXTRACTION Left    CATARACT EXTRACTION     LT 01/2017, 02/2017 on RT   CESAREAN SECTION     COLONOSCOPY WITH PROPOFOL N/A 04/15/2021   Procedure: COLONOSCOPY WITH PROPOFOL;  Surgeon: Hilarie Fredrickson, MD;  Location: [facility];  Service: Endoscopy;  Laterality: N/A;   patient is on O2   JOINT REPLACEMENT Left    Plate in Ankle   LEFT HEART CATH AND CORONARY ANGIOGRAPHY N/A 03/04/2022   Procedure: LEFT HEART CATH AND CORONARY ANGIOGRAPHY;  Surgeon: Elder Negus, MD;  [facility];  Service: Cardiovascular;  Laterality: N/A;   POLYPECTOMY  04/15/2021   Procedure: POLYPECTOMY;  Surgeon: Hilarie Fredrickson, MD;  Location: [facility];  Service: Endoscopy;;    OB History   No obstetric history on file.      Home Medications    Prior to Admission medications   Medication Sig Start Date End Date Taking? Authorizing Provider  amoxicillin-clavulanate (AUGMENTIN) 875-125 MG tablet Take 1 tablet by mouth 2 (two) times daily for 7 days. 09/16/23 09/23/23 Yes Valentino Nose, NP  Accu-Chek Softclix Lancets lancets Use as directed. To check blood sugars 4x/day ICD E11.69 03/24/23   Marcine Matar, MD  acetaminophen (TYLENOL) 500 MG tablet Take 500-1,000 mg by mouth every 6 (six) hours as needed (pain).    [provider]  albuterol (PROVENTIL) (2.5 MG/3ML) 0.083% nebulizer solution Take 3 mLs (2.5 mg total) by nebulization every 6 (six) hours as needed for wheezing or shortness of breath. 03/18/22   Cobb, Ruby Cola, NP  albuterol (VENTOLIN HFA) 108 (90 Base) MCG/ACT inhaler Inhale 1-2 puffs into the lungs every 4 (four) hours as needed for wheezing or shortness of breath. 10/21/20   Charlott Holler, MD  Alcohol Swabs (DROPSAFE ALCOHOL PREP) 70 % PADS USE AS DIRECTED 08/09/23   Marcine Matar, MD  apixaban (ELIQUIS) 5 MG TABS tablet Take 1 tablet (5 mg total) by mouth 2 (two) times daily. 08/25/23   Marcine Matar, MD  atenolol (TENORMIN) 25 MG tablet Take 1 tablet (25 mg total) by mouth daily. 11/27/22   Patwardhan, Anabel Bene, MD  atorvastatin (LIPITOR) 40 MG tablet TAKE 1 TABLET BY MOUTH EVERY DAY 05/04/23   Marcine Matar, MD  Blood Glucose Calibration (ACCU-CHEK GUIDE CONTROL) LIQD L1-L2 Control Solution. UAD 08/04/22    Marcine Matar, MD  Blood Glucose Monitoring Suppl (ACCU-CHEK GUIDE) w/Device KIT UAD 08/04/22   Marcine Matar, MD  budesonide-formoterol [facility]) 80-4.5 MCG/ACT inhaler Inhale 2 puffs into the lungs in the morning and at bedtime. 03/18/22   Cobb, Ruby Cola, NP  diltiazem (CARDIZEM CD) 120 MG 24 hr capsule TAKE 1 CAPSULE EVERY DAY 08/24/23   Patwardhan, Manish J, MD  empagliflozin (JARDIANCE) 10 MG TABS tablet Take 1 tablet (10 mg total) by mouth daily. 08/19/23   Marcine Matar, MD  famotidine (PEPCID) 20 MG tablet Take 1 tablet (20 mg total) by mouth daily. 08/25/23   Marcine Matar, MD  furosemide (LASIX) 40 MG tablet Take 1 tablet (40 mg total) by mouth daily. 08/19/23   Marcine Matar, MD  glucose blood (ACCU-CHEK GUIDE) test strip Test blood sugars 3-4 times a day with meals and at bedtime.  E11.69 03/16/23   Marcine Matar, MD  insulin lispro protamine-lispro (HUMALOG MIX 75/25) (75-25) 100 UNIT/ML SUSP injection Inject 0.55 mLs (55 Units total) into the skin every morning AND 0.45 mLs (45 Units total) every evening. 08/19/23   Marcine Matar, MD  Insulin Syringe-Needle U-100 (INSULIN SYRINGE 1CC/31GX5/16") 31G X 5/16" 1 ML MISC Use to inject Humalog 75/25 mix twice daily 07/26/23   Marcine Matar, MD  loratadine (CLARITIN) 10 MG tablet Take 1 tablet (10 mg total) by mouth daily as needed for allergies. 08/19/23   Marcine Matar, MD  losartan (COZAAR) 50 MG tablet TAKE 1 TABLET EVERY DAY 09/03/23   Marcine Matar, MD  montelukast (SINGULAIR) 10 MG tablet Take 1 tablet (10 mg total) by mouth at bedtime. 01/27/23   Charlott Holler, MD  Multiple Vitamin (MULTIVITAMIN WITH MINERALS) TABS tablet Take 1 tablet by mouth in the morning. Centrum Silver    [provider]  nitroGLYCERIN (NITROSTAT) 0.3 MG SL tablet DISSOLVE 1 TABLET UNDER THE  TONGUE EVERY 5 MINUTES AS NEEDED FOR CHEST PAIN. MAX OF 3 TABLETS IN 15 MINUTES. CALL 911 IF PAIN  PERSISTS. Strength: 0.3  mg 04/30/22   Patwardhan, Anabel Bene, MD  OXYGEN Inhale 3 L into the lungs continuous.    [provider]  potassium chloride SA (KLOR-CON M) 20 MEQ tablet TAKE 1 TABLET EVERY DAY 12/25/22   Marcine Matar, MD  Pseudoeph-Doxylamine-DM-APAP (NYQUIL PO) Take by mouth as needed.    [provider]  tiZANidine (ZANAFLEX) 2 MG tablet TAKE 1 TABLET BY MOUTH DAILY AS NEEDED FOR MUSCLE SPASM- MUST LAST ONE MONTH 07/29/23   Marcine Matar, MD  topiramate (TOPAMAX) 25 MG tablet TAKE 1 TABLET (25 MG) WITH TOPIRAMATE 50 MG TABLET BY MOUTH TWICE DAILY FOR TOTAL DOSE OF 75 MG TWICE DAILY. 03/04/23   Anson Fret, MD  topiramate (TOPAMAX) 50 MG tablet TAKE 1 TABLET (50 MG) WITH 25 MG TABLET BY MOUTH TWICE DAILY FOR TOTAL DOSE OF 75 MG TWICE DAILY. 03/04/23   Anson Fret, MD  Ubrogepant (UBRELVY) 100 MG TABS Take 1 tablet onset migraine, may take another tablet if needed 2 hours later. (2 tab max/daily) 08/19/23   Marcine Matar, MD    Family History Family History  Problem Relation Age of Onset   Colon cancer Father    Diabetes type II Sister    Colon cancer Sister    Diabetes type II Brother    Migraines Neg Hx    Breast cancer Neg Hx     Social History Social History   Tobacco Use   Smoking status: Never    Passive exposure: Never   Smokeless tobacco: Never  Vaping Use   Vaping status: Never Used  Substance Use Topics   Alcohol use: No   Drug use: No     Allergies   Januvia [sitagliptin] and Tramadol   Review of Systems Review of Systems Per HPI  Physical Exam Triage Vital Signs ED Triage Vitals  Encounter Vitals Group     BP 09/16/23 1306 118/71     Systolic BP Percentile --      Diastolic BP Percentile --      Pulse Rate 09/16/23 1306 63     Resp 09/16/23 1306 16     Temp 09/16/23 1306 98.5 F (36.9 C)     Temp Source 09/16/23 1306 Oral     SpO2 09/16/23 1306 92 %  Weight 09/16/23 1306 229 lb (103.9 kg)     Height 09/16/23 1306 5\' 4"   (1.626 m)     Head Circumference --      Peak Flow --      Pain Score 09/16/23 1303 9     Pain Loc --      Pain Education --      Exclude from Growth Chart --    No data found.  Updated Vital Signs BP 118/71 (BP Location: Left Arm)   Pulse 63   Temp 98.5 F (36.9 C) (Oral)   Resp 16   Ht 5\' 4"  (1.626 m)   Wt 229 lb (103.9 kg)   SpO2 92%   BMI 39.31 kg/m   Visual Acuity Right Eye Distance:   Left Eye Distance:   Bilateral Distance:    Right Eye Near:   Left Eye Near:    Bilateral Near:     Physical Exam Vitals and nursing note reviewed.  Constitutional:      General: She is not in acute distress.    Appearance: Normal appearance. She is not ill-appearing or toxic-appearing.  HENT:     Head: Normocephalic and atraumatic.     Right Ear: Tympanic membrane, ear canal and external ear normal.     Left Ear: Tympanic membrane, ear canal and external ear normal.     Nose: Congestion present. No rhinorrhea.     Right Sinus: No maxillary sinus tenderness or frontal sinus tenderness.     Left Sinus: No maxillary sinus tenderness or frontal sinus tenderness.     Mouth/Throat:     Mouth: Mucous membranes are moist.     Pharynx: Oropharynx is clear. No oropharyngeal exudate or posterior oropharyngeal erythema.  Eyes:     General: No scleral icterus.    Extraocular Movements: Extraocular movements intact.  Cardiovascular:     Rate and Rhythm: Normal rate and regular rhythm.  Pulmonary:     Effort: Pulmonary effort is normal. No respiratory distress.     Breath sounds: Normal breath sounds. No wheezing, rhonchi or rales.  Musculoskeletal:     Cervical back: Normal range of motion and neck supple.  Lymphadenopathy:     Cervical: No cervical adenopathy.  Skin:    General: Skin is warm and dry.     Coloration: Skin is not jaundiced or pale.     Findings: No erythema or rash.  Neurological:     Mental Status: She is alert and oriented to person, place, and time.   Psychiatric:        Behavior: Behavior is cooperative.      UC Treatments / Results  Labs (all labs ordered are listed, but only abnormal results are displayed) Labs Reviewed - No data to display  EKG   Radiology No results found.  Procedures Procedures (including critical care time)  Medications Ordered in UC Medications - No data to display  Initial Impression / Assessment and Plan / UC Course  I have reviewed the triage vital signs and the nursing notes.  Pertinent labs & imaging results that were available during my care of the patient were reviewed by me and considered in my medical decision making (see chart for details).   Patient is well-appearing, normotensive, afebrile, not tachycardic, not tachypneic, oxygenating well on room air.    1. Bacterial upper respiratory infection Vitals and examination are reassuring Given length of symptoms, will treat for bacterial cause with Augmentin twice daily for 7 days Other supportive care  discussed with patient  Return and ER precautions discussed   The patient was given the opportunity to ask questions.  All questions answered to their satisfaction.  The patient is in agreement to this plan.   Final Clinical Impressions(s) / UC Diagnoses   Final diagnoses:  Bacterial upper respiratory infection     Discharge Instructions      Take the Augmentin twice daily for 7 days as prescribed to treat a bacterial upper respiratory infection.  Continue plenty of hydration with water and Mucinex as needed to help with the congestion.  Follow up with PCP if symptoms do not improve with treatment.    ED Prescriptions     Medication Sig Dispense Auth. Provider   amoxicillin-clavulanate (AUGMENTIN) 875-125 MG tablet Take 1 tablet by mouth 2 (two) times daily for 7 days. 14 tablet Valentino Nose, NP      PDMP not reviewed this encounter.   Valentino Nose, NP 09/16/23 1348

## 2023-09-16 NOTE — Discharge Instructions (Signed)
 Take the Augmentin twice daily for 7 days as prescribed to treat a bacterial upper respiratory infection.  Continue plenty of hydration with water and Mucinex as needed to help with the congestion.  Follow up with PCP if symptoms do not improve with treatment.

## 2023-09-16 NOTE — ED Triage Notes (Signed)
 Patient here today with c/o bilat ear pain, cough, sneeze, dizziness, and weakness X 1 week. She has been taking Mucinex, sinus med, and a cough syrup with no relief. No known sick contacts.

## 2023-09-18 ENCOUNTER — Other Ambulatory Visit: Payer: Self-pay | Admitting: Internal Medicine

## 2023-09-18 DIAGNOSIS — M545 Low back pain, unspecified: Secondary | ICD-10-CM

## 2023-09-20 ENCOUNTER — Other Ambulatory Visit: Payer: Self-pay | Admitting: Cardiology

## 2023-09-20 NOTE — Telephone Encounter (Signed)
 Requested medication (s) are due for refill today - yes  Requested medication (s) are on the active medication list -yes  Future visit scheduled -yes  Last refill: 07/29/23 #30 1RF  Notes to clinic: non delegated Rx   Requested Prescriptions  Pending Prescriptions Disp Refills   tiZANidine (ZANAFLEX) 2 MG tablet [Pharmacy Med Name: TIZANIDINE 2MG  TABLETS] 30 tablet 1    Sig: TAKE 1 TABLET BY MOUTH DAILY AS NEEDED FOR MUSCLE SPASM     Not Delegated - Cardiovascular:  Alpha-2 Agonists - tizanidine Failed - 09/20/2023 10:33 AM      Failed - This refill cannot be delegated      Passed - Valid encounter within last 6 months    Recent Outpatient Visits           1 month ago Type 2 diabetes mellitus with morbid obesity (HCC)   [place] [facility] [place]. [facility] Jonah Blue B, MD   8 months ago Type 2 diabetes mellitus with obesity [facility])   [place] [facility] [place]. [facility] Jonah Blue B, MD   12 months ago Type 2 diabetes mellitus with morbid obesity [facility])   [place] [facility] - [facility] Dept Of [place]. [facility] Marcine Matar, MD   1 year ago Hospital discharge follow-up   [place] [facility] Dept Of [place]. [facility] Jonah Blue B, MD   1 year ago Type 2 diabetes mellitus with morbid obesity [facility])   [place] [facility] - [facility] Dept Of [place]. [facility] Marcine Matar, MD       Future Appointments             In 2 months Laural Benes Binnie Rail, MD [facility] [place] - A Dept Of [place] [facility]. [facility]               Requested Prescriptions  Pending Prescriptions Disp Refills   tiZANidine (ZANAFLEX) 2 MG tablet [Pharmacy Med Name: TIZANIDINE 2MG  TABLETS] 30 tablet 1    Sig: TAKE 1 TABLET BY MOUTH DAILY AS NEEDED FOR MUSCLE SPASM     Not Delegated - Cardiovascular:  Alpha-2 Agonists  - tizanidine Failed - 09/20/2023 10:33 AM      Failed - This refill cannot be delegated      Passed - Valid encounter within last 6 months    Recent Outpatient Visits           1 month ago Type 2 diabetes mellitus with morbid obesity (HCC)   [place] [facility] [place]. [facility] Jonah Blue B, MD   8 months ago Type 2 diabetes mellitus with obesity [facility])   [place] [facility] - [facility] Dept Of [place]. [facility] Jonah Blue B, MD   12 months ago Type 2 diabetes mellitus with morbid obesity [facility])   [place] [facility] - A Dept Of [place]. [facility] Marcine Matar, MD   1 year ago Hospital discharge follow-up   [place] [facility] - [facility] Dept Of [place]. [facility] Jonah Blue B, MD   1 year ago Type 2 diabetes mellitus with morbid obesity [facility])   [place] [facility] - A Dept Of . [facility] Marcine Matar, MD       Future Appointments             In 2 months Laural Benes Binnie Rail, MD [facility] [place] - A Dept Of [place] Bridegroom. [facility]

## 2023-09-21 ENCOUNTER — Telehealth: Payer: Self-pay | Admitting: Neurology

## 2023-09-21 ENCOUNTER — Other Ambulatory Visit: Payer: Self-pay

## 2023-09-21 NOTE — Telephone Encounter (Signed)
 Patient request refill for Ubrogepant (UBRELVY) 100 MG TABS send to [facility]

## 2023-09-22 ENCOUNTER — Other Ambulatory Visit: Payer: Self-pay

## 2023-09-22 ENCOUNTER — Other Ambulatory Visit: Payer: Self-pay | Admitting: Neurology

## 2023-09-22 ENCOUNTER — Other Ambulatory Visit (HOSPITAL_BASED_OUTPATIENT_CLINIC_OR_DEPARTMENT_OTHER): Payer: Self-pay

## 2023-09-22 MED ORDER — UBRELVY 100 MG PO TABS
ORAL_TABLET | ORAL | 0 refills | Status: DC
  Filled 2023-09-22: qty 30, 30d supply, fill #0

## 2023-09-22 MED ORDER — UBRELVY 100 MG PO TABS
ORAL_TABLET | ORAL | 0 refills | Status: DC
Start: 2023-09-22 — End: 2023-10-28
  Filled 2023-09-22: qty 16, 16d supply, fill #0

## 2023-09-22 NOTE — Telephone Encounter (Signed)
 Pt said contacted insurance company, they said authorization is not required for Ubrelvy. Would like a call from the nurse

## 2023-09-22 NOTE — Telephone Encounter (Signed)
 I called pt and let her know that [facility] sent a Ubrelvy refill to her pharmacy but I also let her know that I spoke with the billing department manager and was told the patient still has a bill of 2031.55 with our office. She should be prepared to pay something when she comes for her next appointment and will need to make regular payments. The patient verbalized understanding and appreciation for the call.

## 2023-09-22 NOTE — Telephone Encounter (Signed)
 Spoke with patient. Last seen 04/2022, pt was given Heidi [name] Rx. Pt has an appt with Dr Lucia Gaskins next month on 4/24. We could give her a refill but cannot do PA if one is required as we would need more recent office notes.  Pt thanked me for the call.

## 2023-09-22 NOTE — Telephone Encounter (Signed)
 Pt called to check on status of refill request.

## 2023-09-22 NOTE — Telephone Encounter (Signed)
 I sent in refill for pt to her pharmacy [facility] [address] Ubrelvy 100mg  tabs #16

## 2023-09-23 ENCOUNTER — Other Ambulatory Visit: Payer: Self-pay

## 2023-09-24 ENCOUNTER — Other Ambulatory Visit (HOSPITAL_COMMUNITY): Payer: Self-pay

## 2023-09-24 ENCOUNTER — Telehealth: Payer: Self-pay

## 2023-09-24 NOTE — Telephone Encounter (Signed)
 Received a faxed request to renew Heidi [name] however PT has not been seen in over a year and would need updated office notes in order to process [name]. Will put [name] on Hold for now. [facility] is aware per recent telephone note.

## 2023-09-27 ENCOUNTER — Encounter: Payer: Self-pay | Admitting: Nurse Practitioner

## 2023-09-27 ENCOUNTER — Ambulatory Visit: Attending: Nurse Practitioner | Admitting: Nurse Practitioner

## 2023-09-27 ENCOUNTER — Other Ambulatory Visit: Payer: Self-pay

## 2023-09-27 VITALS — BP 121/79 | HR 71 | Resp 18 | Ht 64.0 in | Wt 230.0 lb

## 2023-09-27 DIAGNOSIS — J069 Acute upper respiratory infection, unspecified: Secondary | ICD-10-CM | POA: Diagnosis not present

## 2023-09-27 DIAGNOSIS — I2489 Other forms of acute ischemic heart disease: Secondary | ICD-10-CM | POA: Diagnosis not present

## 2023-09-27 DIAGNOSIS — J4540 Moderate persistent asthma, uncomplicated: Secondary | ICD-10-CM

## 2023-09-27 MED ORDER — NITROGLYCERIN 0.3 MG SL SUBL
SUBLINGUAL_TABLET | SUBLINGUAL | 1 refills | Status: DC
  Filled 2023-09-27: qty 100, 90d supply, fill #0

## 2023-09-27 MED ORDER — ALBUTEROL SULFATE HFA 108 (90 BASE) MCG/ACT IN AERS
1.0000 | INHALATION_SPRAY | RESPIRATORY_TRACT | 5 refills | Status: AC | PRN
  Filled 2023-09-27: qty 18, 17d supply, fill #0

## 2023-09-27 MED ORDER — ALBUTEROL SULFATE (2.5 MG/3ML) 0.083% IN NEBU
2.5000 mg | INHALATION_SOLUTION | Freq: Four times a day (QID) | RESPIRATORY_TRACT | 5 refills | Status: AC | PRN
Start: 2023-09-27 — End: ?
  Filled 2023-09-27: qty 75, 7d supply, fill #0

## 2023-09-27 MED ORDER — IPRATROPIUM BROMIDE 0.03 % NA SOLN
2.0000 | Freq: Two times a day (BID) | NASAL | 0 refills | Status: DC
  Filled 2023-09-27: qty 30, 43d supply, fill #0

## 2023-09-27 MED ORDER — PREDNISONE 10 MG PO TABS
10.0000 mg | ORAL_TABLET | Freq: Every day | ORAL | 0 refills | Status: AC
Start: 2023-09-27 — End: 2023-10-03
  Filled 2023-09-27: qty 5, 5d supply, fill #0

## 2023-09-27 MED ORDER — IPRATROPIUM BROMIDE 0.03 % NA SOLN
2.0000 | Freq: Two times a day (BID) | NASAL | 12 refills | Status: DC
  Filled 2023-09-27: qty 30, 75d supply, fill #0

## 2023-09-27 NOTE — Progress Notes (Signed)
 Sore throat, still feels ear pain, fatigue are symptoms that the patient has still been experiencing.

## 2023-09-27 NOTE — Progress Notes (Signed)
 Assessment & Plan:  Heidi [name] was seen today for uri.  Diagnoses and all orders for this visit:  URI with cough and congestion -     CBC with Differential -     albuterol (VENTOLIN HFA) 108 (90 Base) MCG/ACT inhaler; Inhale 1-2 puffs into the lungs every 4 (four) hours as needed for wheezing or shortness of breath. -     predniSONE (DELTASONE) 10 MG tablet; Take 1 tablet (10 mg total) by mouth daily with breakfast for 5 days. -     ipratropium (ATROVENT) 0.03 % nasal spray; Place 2 sprays into both nostrils every 12 (twelve) hours.  Moderate persistent asthma without complication -     CBC with Differential -     albuterol (PROVENTIL) (2.5 MG/3ML) 0.083% nebulizer solution; Take 3 mLs (2.5 mg total) by nebulization every 6 (six) hours as needed for wheezing or shortness of breath. -     albuterol (VENTOLIN HFA) 108 (90 Base) MCG/ACT inhaler; Inhale 1-2 puffs into the lungs every 4 (four) hours as needed for wheezing or shortness of breath. -     predniSONE (DELTASONE) 10 MG tablet; Take 1 tablet (10 mg total) by mouth daily with breakfast for 5 days.  Demand ischemia (HCC) -     nitroGLYCERIN (NITROSTAT) 0.3 MG SL tablet; DISSOLVE 1 TABLET UNDER THE  TONGUE EVERY 5 MINUTES AS NEEDED FOR CHEST PAIN. MAX OF 3 TABLETS IN 15 MINUTES. CALL 911 IF PAIN  PERSISTS. Strength: 0.3 mg    Patient has been counseled on age-appropriate routine health concerns for screening and prevention. These are reviewed and up-to-date. Referrals have been placed accordingly. Immunizations are up-to-date or declined.    Subjective:   Chief Complaint  Patient presents with   URI    Continued symptoms.    Tynetta Bachmann 73 y.o. female presents to office today with persistent URI symptoms She is a patient of Dr. Laural Benes   Ms. Benna Dunks presented to the urgent care on 09/16/2023.  At that time she endorsed more than 1 week history of chills, sweats, fever, fatigue and generalized weakness, congested cough, chest  pain, runny and stuffy nose, headache, stuffy left ear and right ear ache.  There was no viral respiratory panel performed at that time so it is unclear if she had COVID, RSV, or influenza A.  She was treated with Augmentin for presumed ear infection despite physical exam being unremarkable of both ears.   Today she is here with concerns of persistent bilateral ear fullness with left greater than right, mild sore throat and fatigue. She has been using mucinex at home and still notes chest tightness and congestion. She does not have her portable O2 with her today nor does she have a rescue inhaler in her handbag. Nebulizers are expired. States she has not officially been diagnosed with COPD or asthma and they are still trying to figure out what is wrong with her. Notes sore throat mostly occurring in the morning upon awakening.     Review of Systems  Constitutional:  Negative for fever and weight loss.  HENT:  Positive for congestion and ear pain. Negative for nosebleeds.   Eyes: Negative.  Negative for blurred vision, double vision and photophobia.  Respiratory:  Positive for cough and shortness of breath.   Cardiovascular: Negative.  Negative for chest pain, palpitations and leg swelling.  Gastrointestinal: Negative.  Negative for heartburn, nausea and vomiting.  Musculoskeletal: Negative.  Negative for myalgias.  Neurological: Negative.  Negative for  dizziness, focal weakness, seizures and headaches.  Psychiatric/Behavioral: Negative.  Negative for suicidal ideas.     Past Medical History:  Diagnosis Date   Adenomatous colon polyp    Anemia    Anxiety    Asthma    CHF (congestive heart failure) (HCC)    COPD (chronic obstructive pulmonary disease) (HCC)    Diabetes mellitus without complication (HCC)    High cholesterol    Hypertension    Left knee injury    Ocular hypertension 08/12/2022   OD   Pneumonia    Sleep apnea    Supplemental oxygen dependent    2L [place]    Past  Surgical History:  Procedure Laterality Date   ABDOMINAL HYSTERECTOMY     CATARACT EXTRACTION Left    CATARACT EXTRACTION     LT 01/2017, 02/2017 on RT   CESAREAN SECTION     COLONOSCOPY WITH PROPOFOL N/A 04/15/2021   Procedure: COLONOSCOPY WITH PROPOFOL;  Surgeon: Hilarie Fredrickson, MD;  Location: [facility];  Service: Endoscopy;  Laterality: N/A;  patient is on O2   JOINT REPLACEMENT Left    Plate in Ankle   LEFT HEART CATH AND CORONARY ANGIOGRAPHY N/A 03/04/2022   Procedure: LEFT HEART CATH AND CORONARY ANGIOGRAPHY;  Surgeon: Elder Negus, MD;  Location: [facility];  Service: Cardiovascular;  Laterality: N/A;   POLYPECTOMY  04/15/2021   Procedure: POLYPECTOMY;  Surgeon: Hilarie Fredrickson, MD;  Location: [facility];  Service: Endoscopy;;    Family History  Problem Relation Age of Onset   Colon cancer Father    Diabetes type II Sister    Colon cancer Sister    Diabetes type II Brother    Migraines Neg Hx    Breast cancer Neg Hx     Social History Reviewed with no changes to be made today.   Outpatient Medications Prior to Visit  Medication Sig Dispense Refill   Accu-Chek Softclix Lancets lancets Use as directed. To check blood sugars 4x/day ICD E11.69 100 each 10   acetaminophen (TYLENOL) 500 MG tablet Take 500-1,000 mg by mouth every 6 (six) hours as needed (pain).     Alcohol Swabs (DROPSAFE ALCOHOL PREP) 70 % PADS USE AS DIRECTED 300 each 3   apixaban (ELIQUIS) 5 MG TABS tablet Take 1 tablet (5 mg total) by mouth 2 (two) times daily. 180 tablet 1   atenolol (TENORMIN) 25 MG tablet Take 1 tablet (25 mg total) by mouth daily. 90 tablet 2   atorvastatin (LIPITOR) 40 MG tablet TAKE 1 TABLET BY MOUTH EVERY DAY 90 tablet 2   Blood Glucose Calibration (ACCU-CHEK GUIDE CONTROL) LIQD L1-L2 Control Solution. UAD 1 each 1   Blood Glucose Monitoring Suppl (ACCU-CHEK GUIDE) w/Device KIT UAD 1 kit 0   budesonide-formoterol (SYMBICORT) 80-4.5 MCG/ACT inhaler Inhale 2 puffs into  the lungs in the morning and at bedtime. 1 each 5   diltiazem (CARDIZEM CD) 120 MG 24 hr capsule Take 1 capsule (120 mg total) by mouth daily. Pt needs to schedule appt with provider for further refills - 2nd attempt 15 capsule 0   empagliflozin (JARDIANCE) 10 MG TABS tablet Take 1 tablet (10 mg total) by mouth daily. 90 tablet 1   famotidine (PEPCID) 20 MG tablet Take 1 tablet (20 mg total) by mouth daily. 90 tablet 1   furosemide (LASIX) 40 MG tablet Take 1 tablet (40 mg total) by mouth daily. 90 tablet 3   glucose blood (ACCU-CHEK GUIDE) test strip Test  blood sugars 3-4 times a day with meals and at bedtime. E11.69 100 each 12   insulin lispro protamine-lispro (HUMALOG MIX 75/25) (75-25) 100 UNIT/ML SUSP injection Inject 0.55 mLs (55 Units total) into the skin every morning AND 0.45 mLs (45 Units total) every evening. 30 mL 6   Insulin Syringe-Needle U-100 (INSULIN SYRINGE 1CC/31GX5/16") 31G X 5/16" 1 ML MISC Use to inject Humalog 75/25 mix twice daily 200 each 6   loratadine (CLARITIN) 10 MG tablet Take 1 tablet (10 mg total) by mouth daily as needed for allergies. 90 tablet 0   losartan (COZAAR) 50 MG tablet TAKE 1 TABLET EVERY DAY 90 tablet 3   montelukast (SINGULAIR) 10 MG tablet Take 1 tablet (10 mg total) by mouth at bedtime. 90 tablet 2   Multiple Vitamin (MULTIVITAMIN WITH MINERALS) TABS tablet Take 1 tablet by mouth in the morning. Centrum Silver     OXYGEN Inhale 3 L into the lungs continuous.     potassium chloride SA (KLOR-CON M) 20 MEQ tablet TAKE 1 TABLET EVERY DAY 90 tablet 0   Pseudoeph-Doxylamine-DM-APAP (NYQUIL PO) Take by mouth as needed.     tiZANidine (ZANAFLEX) 2 MG tablet TAKE 1 TABLET BY MOUTH DAILY AS NEEDED FOR MUSCLE SPASM 30 tablet 1   topiramate (TOPAMAX) 25 MG tablet TAKE 1 TABLET (25 MG) WITH TOPIRAMATE 50 MG TABLET BY MOUTH TWICE DAILY FOR TOTAL DOSE OF 75 MG TWICE DAILY. 180 tablet 0   topiramate (TOPAMAX) 50 MG tablet TAKE 1 TABLET (50 MG) WITH 25 MG TABLET BY  MOUTH TWICE DAILY FOR TOTAL DOSE OF 75 MG TWICE DAILY. 180 tablet 0   Ubrogepant (UBRELVY) 100 MG TABS Take 1 tablet onset migraine, may take another tablet if needed 2 hours later. (2 tab max/daily) 16 tablet 0   albuterol (PROVENTIL) (2.5 MG/3ML) 0.083% nebulizer solution Take 3 mLs (2.5 mg total) by nebulization every 6 (six) hours as needed for wheezing or shortness of breath. 75 mL 5   albuterol (VENTOLIN HFA) 108 (90 Base) MCG/ACT inhaler Inhale 1-2 puffs into the lungs every 4 (four) hours as needed for wheezing or shortness of breath. 8 g 5   nitroGLYCERIN (NITROSTAT) 0.3 MG SL tablet DISSOLVE 1 TABLET UNDER THE  TONGUE EVERY 5 MINUTES AS NEEDED FOR CHEST PAIN. MAX OF 3 TABLETS IN 15 MINUTES. CALL 911 IF PAIN  PERSISTS. Strength: 0.3 mg 200 tablet 2   No facility-administered medications prior to visit.    Allergies  Allergen Reactions   Januvia [Sitagliptin]     Chest pains   Tramadol Other (See Comments)    hallucinations       Objective:    BP 121/79 (BP Location: Left Arm, Patient Position: Sitting, Cuff Size: Normal)   Pulse 71   Resp 18   Ht 5\' 4"  (1.626 m)   Wt 230 lb (104.3 kg)   SpO2 95%   BMI 39.48 kg/m  Wt Readings from Last 3 Encounters:  09/27/23 230 lb (104.3 kg)  09/16/23 229 lb (103.9 kg)  08/19/23 228 lb (103.4 kg)    Physical Exam Constitutional:      Appearance: She is well-developed. She is ill-appearing.  HENT:     Head: Normocephalic.     Right Ear: Hearing, ear canal and external ear normal. A middle ear effusion is present.     Left Ear: Hearing, ear canal and external ear normal. A middle ear effusion is present.     Nose: Mucosal edema present. No congestion  or rhinorrhea.     Right Sinus: No maxillary sinus tenderness or frontal sinus tenderness.     Left Sinus: No maxillary sinus tenderness or frontal sinus tenderness.     Mouth/Throat:     Pharynx: No oropharyngeal exudate or posterior oropharyngeal erythema.     Tonsils: No tonsillar  abscesses.  Neck:     Thyroid: No thyromegaly.  Cardiovascular:     Rate and Rhythm: Normal rate and regular rhythm.     Heart sounds: No murmur heard.    No friction rub. No gallop.  Pulmonary:     Effort: No respiratory distress.     Breath sounds: Normal breath sounds. No wheezing or rales.     Comments: Winded when speaking in full sentences.  Chest:     Chest wall: No tenderness.  Abdominal:     General: Bowel sounds are normal.     Palpations: Abdomen is soft.  Musculoskeletal:        General: Normal range of motion.     Cervical back: Normal range of motion.  Lymphadenopathy:     Cervical: No cervical adenopathy.  Skin:    General: Skin is warm and dry.  Neurological:     Mental Status: She is alert and oriented to person, place, and time.  Psychiatric:        Behavior: Behavior normal.        Thought Content: Thought content normal.        Judgment: Judgment normal.          Patient has been counseled extensively about nutrition and exercise as well as the importance of adherence with medications and regular follow-up. The patient was given clear instructions to go to ER or return to medical center if symptoms don't improve, worsen or new problems develop. The patient verbalized understanding.   Follow-up: Return if symptoms worsen or fail to improve.   Claiborne Rigg, FNP-BC [facility] [place], [place] [phone]   09/27/2023, 4:33 PM

## 2023-09-28 ENCOUNTER — Emergency Department (HOSPITAL_COMMUNITY)
Admission: EM | Admit: 2023-09-28 | Discharge: 2023-09-29 | Disposition: A | Discharge status: Home / Self Care | Attending: Emergency Medicine | Admitting: Emergency Medicine

## 2023-09-28 ENCOUNTER — Other Ambulatory Visit: Payer: Self-pay

## 2023-09-28 ENCOUNTER — Encounter (HOSPITAL_COMMUNITY): Payer: Self-pay

## 2023-09-28 ENCOUNTER — Emergency Department (HOSPITAL_COMMUNITY)

## 2023-09-28 DIAGNOSIS — J4489 Other specified chronic obstructive pulmonary disease: Secondary | ICD-10-CM | POA: Insufficient documentation

## 2023-09-28 DIAGNOSIS — N289 Disorder of kidney and ureter, unspecified: Secondary | ICD-10-CM | POA: Insufficient documentation

## 2023-09-28 DIAGNOSIS — I10 Essential (primary) hypertension: Secondary | ICD-10-CM | POA: Insufficient documentation

## 2023-09-28 DIAGNOSIS — Z794 Long term (current) use of insulin: Secondary | ICD-10-CM | POA: Insufficient documentation

## 2023-09-28 DIAGNOSIS — E119 Type 2 diabetes mellitus without complications: Secondary | ICD-10-CM | POA: Insufficient documentation

## 2023-09-28 DIAGNOSIS — M50121 Cervical disc disorder at C4-C5 level with radiculopathy: Secondary | ICD-10-CM | POA: Diagnosis not present

## 2023-09-28 DIAGNOSIS — M436 Torticollis: Secondary | ICD-10-CM | POA: Insufficient documentation

## 2023-09-28 DIAGNOSIS — J45909 Unspecified asthma, uncomplicated: Secondary | ICD-10-CM | POA: Insufficient documentation

## 2023-09-28 DIAGNOSIS — Z7901 Long term (current) use of anticoagulants: Secondary | ICD-10-CM | POA: Insufficient documentation

## 2023-09-28 DIAGNOSIS — R29818 Other symptoms and signs involving the nervous system: Secondary | ICD-10-CM | POA: Diagnosis not present

## 2023-09-28 DIAGNOSIS — R9089 Other abnormal findings on diagnostic imaging of central nervous system: Secondary | ICD-10-CM | POA: Diagnosis not present

## 2023-09-28 DIAGNOSIS — Z79899 Other long term (current) drug therapy: Secondary | ICD-10-CM | POA: Insufficient documentation

## 2023-09-28 DIAGNOSIS — R0602 Shortness of breath: Secondary | ICD-10-CM | POA: Diagnosis not present

## 2023-09-28 DIAGNOSIS — I6782 Cerebral ischemia: Secondary | ICD-10-CM | POA: Diagnosis not present

## 2023-09-28 DIAGNOSIS — J449 Chronic obstructive pulmonary disease, unspecified: Secondary | ICD-10-CM | POA: Insufficient documentation

## 2023-09-28 LAB — BASIC METABOLIC PANEL
Anion gap: 12 (ref 5–15)
BUN: 19 mg/dL (ref 8–23)
CO2: 24 mmol/L (ref 22–32)
Calcium: 9.4 mg/dL (ref 8.9–10.3)
Chloride: 101 mmol/L (ref 98–111)
Creatinine, Ser: 1.18 mg/dL — ABNORMAL HIGH (ref 0.44–1.00)
GFR, Estimated: 49 mL/min — ABNORMAL LOW (ref >60)
Glucose, Bld: 182 mg/dL — ABNORMAL HIGH (ref 70–99)
Potassium: 3.8 mmol/L (ref 3.5–5.1)
Sodium: 137 mmol/L (ref 135–145)

## 2023-09-28 LAB — CBC WITH DIFFERENTIAL/PLATELET
Basophils Absolute: 0.1 10*3/uL (ref 0.0–0.2)
Basos: 1 %
EOS (ABSOLUTE): 0.3 10*3/uL (ref 0.0–0.4)
Eos: 4 %
Hematocrit: 40.2 % (ref 34.0–46.6)
Hemoglobin: 13.1 g/dL (ref 11.1–15.9)
Immature Grans (Abs): 0.1 10*3/uL (ref 0.0–0.1)
Immature Granulocytes: 1 %
Lymphocytes Absolute: 3.7 10*3/uL — ABNORMAL HIGH (ref 0.7–3.1)
Lymphs: 41 %
MCH: 28.2 pg (ref 26.6–33.0)
MCHC: 32.6 g/dL (ref 31.5–35.7)
MCV: 87 fL (ref 79–97)
Monocytes Absolute: 0.7 10*3/uL (ref 0.1–0.9)
Monocytes: 8 %
Neutrophils Absolute: 4.2 10*3/uL (ref 1.4–7.0)
Neutrophils: 45 %
Platelets: 230 10*3/uL (ref 150–450)
RBC: 4.64 x10E6/uL (ref 3.77–5.28)
RDW: 13.9 % (ref 11.7–15.4)
WBC: 9 10*3/uL (ref 3.4–10.8)

## 2023-09-28 LAB — CBC
HCT: 41.1 % (ref 36.0–46.0)
Hemoglobin: 12.9 g/dL (ref 12.0–15.0)
MCH: 28 pg (ref 26.0–34.0)
MCHC: 31.4 g/dL (ref 30.0–36.0)
MCV: 89.2 fL (ref 80.0–100.0)
Platelets: 215 10*3/uL (ref 150–400)
RBC: 4.61 MIL/uL (ref 3.87–5.11)
RDW: 14.3 % (ref 11.5–15.5)
WBC: 10.1 10*3/uL (ref 4.0–10.5)
nRBC: 0 % (ref 0.0–0.2)

## 2023-09-28 LAB — TROPONIN I (HIGH SENSITIVITY): Troponin I (High Sensitivity): <2 ng/L (ref <18)

## 2023-09-28 NOTE — ED Triage Notes (Signed)
 Pt states a few hours ago she was combing her hair and she felt like the left side of her neck could not turn and she had pain in that area of he neck she has been dealing with potentiall some ear infections in which she went to he PCP for them today. She also endorses that she has had some slight chest pain and well as shortness of breath. She has multiple other complaints that do not align with the current symptomology at this time.

## 2023-09-29 ENCOUNTER — Emergency Department (HOSPITAL_COMMUNITY)

## 2023-09-29 ENCOUNTER — Telehealth: Payer: Self-pay | Admitting: Internal Medicine

## 2023-09-29 DIAGNOSIS — R9089 Other abnormal findings on diagnostic imaging of central nervous system: Secondary | ICD-10-CM | POA: Diagnosis not present

## 2023-09-29 DIAGNOSIS — I6782 Cerebral ischemia: Secondary | ICD-10-CM | POA: Diagnosis not present

## 2023-09-29 DIAGNOSIS — M50121 Cervical disc disorder at C4-C5 level with radiculopathy: Secondary | ICD-10-CM | POA: Diagnosis not present

## 2023-09-29 DIAGNOSIS — R29818 Other symptoms and signs involving the nervous system: Secondary | ICD-10-CM | POA: Diagnosis not present

## 2023-09-29 LAB — TROPONIN I (HIGH SENSITIVITY): Troponin I (High Sensitivity): <2 ng/L (ref <18)

## 2023-09-29 MED ORDER — LORAZEPAM 1 MG PO TABS
1.0000 mg | ORAL_TABLET | Freq: Once | ORAL | Status: AC
  Administered 2023-09-29: 1 mg via ORAL
  Filled 2023-09-29: qty 1

## 2023-09-29 MED ORDER — METHOCARBAMOL 500 MG PO TABS: 500.0000 mg | ORAL_TABLET | Freq: Four times a day (QID) | ORAL | 0 refills | Status: DC | PRN

## 2023-09-29 MED ORDER — METHOCARBAMOL 1000 MG/10ML IJ SOLN
1000.0000 mg | Freq: Once | INTRAMUSCULAR | Status: AC
  Administered 2023-09-29: 1000 mg via INTRAVENOUS
  Filled 2023-09-29: qty 10

## 2023-09-29 NOTE — Discharge Instructions (Signed)
 Apply ice for 30 minutes at a time, 4 times a day as needed.  In addition to the muscle relaxer, you may take acetaminophen as needed for pain.  Please follow-up with your primary care provider.  If symptoms are not completely resolved, physical therapy can sometimes be very helpful.  At any point, please return if symptoms are getting worse.

## 2023-09-29 NOTE — Telephone Encounter (Signed)
 Pt given lab results per notes of Z. Meredeth Ide, NP from 09/28/23 on 09/29/23. Pt verbalized understanding. Patient also would like PCP to review lab results ,from labs collected at hospital encounter from yesterday .      Copied from CRM [id number]. Topic: Clinical - Lab/Test Results >> Sep 29, 2023 11:04 AM Priscille Loveless wrote: Reason for CRM: Pt is wanting lab results and I am having trouble understanding them.

## 2023-09-29 NOTE — ED Provider Notes (Signed)
 [place] EMERGENCY DEPARTMENT AT [facility] Provider Note   CSN: [id number] Arrival date & time: 09/28/23  2134     History  Chief Complaint  Patient presents with   Medical problem    Heidi [name] is a 73 y.o. female.  The history is provided by the patient.  She has history of hypertension, diabetes, hyperlipidemia, asthma, COPD with chronic oxygen, paroxysmal atrial fibrillation anticoagulated on apixaban dependence comes in because of episodes where she feels like something is getting blocked and hurt the left side of her neck and she has pain in the left side of her neck radiating to the head and also some numbness in her left arm.  Episodes have been coming for about the last 3 months, but have been getting more severe and more frequent.  Numbness persists after the pain in her neck has resolved.  It seems to be worse when she raises her arm to do things like combing her hair.  She denies any weakness.  There has been some pain which radiated to her chest where she describes a heavy feeling with some associated dyspnea and nausea and diaphoresis.  She was seen at urgent care last week where she was diagnosed with a respiratory tract infection and treated with amoxicillin-clavulanate which has not helped.  On 1 occasion, she took aspirin 324 mg which did seem to give some temporary relief.  She saw her primary care provider yesterday who ordered prednisone, but patient has not taken the prednisone.  She is very concerned that her symptoms are from a blockage in her neck.   Home Medications Prior to Admission medications   Medication Sig Start Date End Date Taking? Authorizing Provider  methocarbamol (ROBAXIN) 500 MG tablet Take 1 tablet (500 mg total) by mouth every 6 (six) hours as needed for muscle spasms. 09/29/23  Yes Dione Booze, MD  Accu-Chek Softclix Lancets lancets Use as directed. To check blood sugars 4x/day ICD E11.69 03/24/23   Marcine Matar, MD   acetaminophen (TYLENOL) 500 MG tablet Take 500-1,000 mg by mouth every 6 (six) hours as needed (pain).    [provider]  albuterol (PROVENTIL) (2.5 MG/3ML) 0.083% nebulizer solution Take 3 mLs (2.5 mg total) by nebulization every 6 (six) hours as needed for wheezing or shortness of breath. 09/27/23   Claiborne Rigg, NP  albuterol (VENTOLIN HFA) 108 (90 Base) MCG/ACT inhaler Inhale 1-2 puffs into the lungs every 4 (four) hours as needed for wheezing or shortness of breath. 09/27/23   Claiborne Rigg, NP  Alcohol Swabs (DROPSAFE ALCOHOL PREP) 70 % PADS USE AS DIRECTED 08/09/23   Marcine Matar, MD  apixaban (ELIQUIS) 5 MG TABS tablet Take 1 tablet (5 mg total) by mouth 2 (two) times daily. 08/25/23   Marcine Matar, MD  atenolol (TENORMIN) 25 MG tablet Take 1 tablet (25 mg total) by mouth daily. 11/27/22   Patwardhan, Anabel Bene, MD  atorvastatin (LIPITOR) 40 MG tablet TAKE 1 TABLET BY MOUTH EVERY DAY 05/04/23   Marcine Matar, MD  Blood Glucose Calibration (ACCU-CHEK GUIDE CONTROL) LIQD L1-L2 Control Solution. UAD 08/04/22   Marcine Matar, MD  Blood Glucose Monitoring Suppl (ACCU-CHEK GUIDE) w/Device KIT UAD 08/04/22   Marcine Matar, MD  budesonide-formoterol [facility]) 80-4.5 MCG/ACT inhaler Inhale 2 puffs into the lungs in the morning and at bedtime. 03/18/22   Cobb, Ruby Cola, NP  diltiazem (CARDIZEM CD) 120 MG 24 hr capsule Take 1 capsule (120  mg total) by mouth daily. Pt needs to schedule appt with provider for further refills - 2nd attempt 09/20/23   Patwardhan, Anabel Bene, MD  empagliflozin (JARDIANCE) 10 MG TABS tablet Take 1 tablet (10 mg total) by mouth daily. 08/19/23   Marcine Matar, MD  famotidine (PEPCID) 20 MG tablet Take 1 tablet (20 mg total) by mouth daily. 08/25/23   Marcine Matar, MD  furosemide (LASIX) 40 MG tablet Take 1 tablet (40 mg total) by mouth daily. 08/19/23   Marcine Matar, MD  glucose blood (ACCU-CHEK GUIDE) test strip Test blood  sugars 3-4 times a day with meals and at bedtime. E11.69 03/16/23   Marcine Matar, MD  insulin lispro protamine-lispro (HUMALOG MIX 75/25) (75-25) 100 UNIT/ML SUSP injection Inject 0.55 mLs (55 Units total) into the skin every morning AND 0.45 mLs (45 Units total) every evening. 08/19/23   Marcine Matar, MD  Insulin Syringe-Needle U-100 (INSULIN SYRINGE 1CC/31GX5/16") 31G X 5/16" 1 ML MISC Use to inject Humalog 75/25 mix twice daily 07/26/23   Marcine Matar, MD  ipratropium (ATROVENT) 0.03 % nasal spray Place 2 sprays into both nostrils every 12 (twelve) hours. 09/27/23   Claiborne Rigg, NP  loratadine (CLARITIN) 10 MG tablet Take 1 tablet (10 mg total) by mouth daily as needed for allergies. 08/19/23   Marcine Matar, MD  losartan (COZAAR) 50 MG tablet TAKE 1 TABLET EVERY DAY 09/03/23   Marcine Matar, MD  montelukast (SINGULAIR) 10 MG tablet Take 1 tablet (10 mg total) by mouth at bedtime. 01/27/23   Charlott Holler, MD  Multiple Vitamin (MULTIVITAMIN WITH MINERALS) TABS tablet Take 1 tablet by mouth in the morning. Centrum Silver    [provider]  nitroGLYCERIN (NITROSTAT) 0.3 MG SL tablet DISSOLVE 1 TABLET UNDER THE  TONGUE EVERY 5 MINUTES AS NEEDED FOR CHEST PAIN. MAX OF 3 TABLETS IN 15 MINUTES. CALL 911 IF PAIN  PERSISTS. Strength: 0.3 mg 09/27/23   Claiborne Rigg, NP  OXYGEN Inhale 3 L into the lungs continuous.    [provider]  potassium chloride SA (KLOR-CON M) 20 MEQ tablet TAKE 1 TABLET EVERY DAY 12/25/22   Marcine Matar, MD  predniSONE (DELTASONE) 10 MG tablet Take 1 tablet (10 mg total) by mouth daily with breakfast for 5 days. 09/27/23 10/03/23  Claiborne Rigg, NP  Pseudoeph-Doxylamine-DM-APAP (NYQUIL PO) Take by mouth as needed.    [provider]  topiramate (TOPAMAX) 25 MG tablet TAKE 1 TABLET (25 MG) WITH TOPIRAMATE 50 MG TABLET BY MOUTH TWICE DAILY FOR TOTAL DOSE OF 75 MG TWICE DAILY. 03/04/23   Anson Fret, MD  topiramate  (TOPAMAX) 50 MG tablet TAKE 1 TABLET (50 MG) WITH 25 MG TABLET BY MOUTH TWICE DAILY FOR TOTAL DOSE OF 75 MG TWICE DAILY. 03/04/23   Anson Fret, MD  Ubrogepant (UBRELVY) 100 MG TABS Take 1 tablet onset migraine, may take another tablet if needed 2 hours later. (2 tab max/daily) 09/22/23   Anson Fret, MD      Allergies    Januvia [sitagliptin] and Tramadol    Review of Systems   Review of Systems  All other systems reviewed and are negative.   Physical Exam Updated Vital Signs BP 120/67   Pulse 71   Temp 98.7 F (37.1 C)   Resp 18   SpO2 93%  Physical Exam Vitals and nursing note reviewed.   73 year old female, resting comfortably  and in no acute distress. Vital signs are significant for elevated blood pressure. Oxygen saturation is 98%, which is normal. Head is normocephalic and atraumatic. PERRLA, EOMI. Oropharynx is clear. Neck has no midline tenderness but there is tenderness of the left paracervical muscles.  Pain is elicited by rotating the head to the right and to the left, no pain elicited with flexion or extension or lateral bending to either side.  There are no carotid bruits. Back is nontender and there is no CVA tenderness. Lungs are clear without rales, wheezes, or rhonchi. Chest is nontender. Heart has regular rate and rhythm without murmur. Abdomen is soft, flat, nontender. Extremities have 1+ edema, full range of motion is present. Skin is warm and dry without rash. Neurologic: Mental status is normal, cranial nerves are intact except for questionable slight decrease sensation in the second division of the left 5th cranial nerve.  Strength is 5/5 in all 4 extremities, there is no pronator drift.  She endorses decrease sensation in the left arm but there is no extinction on double simultaneous stimulation.  No other sensory deficits identified.  ED Results / Procedures / Treatments   Labs (all labs ordered are listed, but only abnormal results are  displayed) Labs Reviewed  BASIC METABOLIC PANEL - Abnormal; Notable for the following components:      Result Value   Glucose, Bld 182 (*)    Creatinine, Ser 1.18 (*)    GFR, Estimated 49 (*)    All other components within normal limits  CBC  TROPONIN I (HIGH SENSITIVITY)  TROPONIN I (HIGH SENSITIVITY)    EKG EKG Interpretation Date/Time:  Tuesday September 28 2023 22:09:10 EDT Ventricular Rate:  67 PR Interval:  192 QRS Duration:  84 QT Interval:  398 QTC Calculation: 420 R Axis:   -32  Text Interpretation: Normal sinus rhythm Left axis deviation Low voltage QRS Cannot rule out Anterior infarct , age undetermined Abnormal ECG When compared with ECG of 06-Mar-2022 14:00, No significant change was found Confirmed by Dione Booze ([id number]) on 09/28/2023 11:09:23 PM  Radiology MR BRAIN WO CONTRAST Result Date: 09/29/2023 CLINICAL DATA:  Cervical radiculopathy.  Neuro deficit EXAM: MRI HEAD WITHOUT CONTRAST MRI CERVICAL SPINE WITHOUT CONTRAST TECHNIQUE: Multiplanar, multiecho pulse sequences of the brain and surrounding structures, and cervical spine, to include the craniocervical junction and cervicothoracic junction, were obtained without intravenous contrast. COMPARISON:  01/27/2021 head and cervical spine CT. Head CT from earlier today FINDINGS: MRI HEAD FINDINGS Brain: No acute infarction, hemorrhage, hydrocephalus, extra-axial collection or mass lesion. Fairly extensive FLAIR hyperintensity in the deep cerebral white matter and pons attributed to chronic small vessel ischemia. Mild for age cerebral volume loss. Major flow voids are preserved Vascular: Normal flow voids. Skull and upper cervical spine: Normal marrow signal. Sinuses/Orbits: Negative. MRI CERVICAL SPINE FINDINGS Alignment: Straightening of cervical lordosis. Vertebrae: No fracture, evidence of discitis, or bone lesion. Cord: Normal signal and morphology. Posterior Fossa, vertebral arteries, paraspinal tissues: Negative. Disc  levels: C2-3: Unremarkable. C3-4: Unremarkable. C4-5: Mild disc narrowing and circumferential bulging. Mild facet spurring on the right C5-6: Greatest level of degenerative disc narrowing with bulging and ridging. Uncovertebral spurs cause bilateral foraminal impingement more advanced on the right. Patent spinal canal C6-7: Disc narrowing and bulging. Negative facets. No neural compression. C7-T1:Unremarkable. IMPRESSION: Brain MRI: No emergent finding. Extensive chronic small vessel ischemia. Cervical spine: Ordinary degeneration most notable at C5-6 where there is right more than left foraminal impingement, advanced on the right. Diffusely patent spinal  canal. Electronically Signed   By: Tiburcio Pea M.D.   On: 09/29/2023 04:17   MR Cervical Spine Wo Contrast Result Date: 09/29/2023 CLINICAL DATA:  Cervical radiculopathy.  Neuro deficit EXAM: MRI HEAD WITHOUT CONTRAST MRI CERVICAL SPINE WITHOUT CONTRAST TECHNIQUE: Multiplanar, multiecho pulse sequences of the brain and surrounding structures, and cervical spine, to include the craniocervical junction and cervicothoracic junction, were obtained without intravenous contrast. COMPARISON:  01/27/2021 head and cervical spine CT. Head CT from earlier today FINDINGS: MRI HEAD FINDINGS Brain: No acute infarction, hemorrhage, hydrocephalus, extra-axial collection or mass lesion. Fairly extensive FLAIR hyperintensity in the deep cerebral white matter and pons attributed to chronic small vessel ischemia. Mild for age cerebral volume loss. Major flow voids are preserved Vascular: Normal flow voids. Skull and upper cervical spine: Normal marrow signal. Sinuses/Orbits: Negative. MRI CERVICAL SPINE FINDINGS Alignment: Straightening of cervical lordosis. Vertebrae: No fracture, evidence of discitis, or bone lesion. Cord: Normal signal and morphology. Posterior Fossa, vertebral arteries, paraspinal tissues: Negative. Disc levels: C2-3: Unremarkable. C3-4: Unremarkable. C4-5:  Mild disc narrowing and circumferential bulging. Mild facet spurring on the right C5-6: Greatest level of degenerative disc narrowing with bulging and ridging. Uncovertebral spurs cause bilateral foraminal impingement more advanced on the right. Patent spinal canal C6-7: Disc narrowing and bulging. Negative facets. No neural compression. C7-T1:Unremarkable. IMPRESSION: Brain MRI: No emergent finding. Extensive chronic small vessel ischemia. Cervical spine: Ordinary degeneration most notable at C5-6 where there is right more than left foraminal impingement, advanced on the right. Diffusely patent spinal canal. Electronically Signed   By: Tiburcio Pea M.D.   On: 09/29/2023 04:17   CT Head Wo Contrast Result Date: 09/29/2023 CLINICAL DATA:  Neuro deficit, acute, stroke suspected EXAM: CT HEAD WITHOUT CONTRAST TECHNIQUE: Contiguous axial images were obtained from the base of the skull through the vertex without intravenous contrast. RADIATION DOSE REDUCTION: This exam was performed according to the departmental dose-optimization program which includes automated exposure control, adjustment of the mA and/or kV according to patient size and/or use of iterative reconstruction technique. COMPARISON:  CT head 01/27/2021. FINDINGS: Brain: No evidence of acute infarction, hemorrhage, hydrocephalus, extra-axial collection or mass lesion/mass effect. Similar patchy white matter hypodensities, nonspecific but compatible with chronic microvascular ischemic disease. Vascular: No hyperdense vessel. Skull: No acute fracture. Sinuses/Orbits: Clear sinuses.  No acute orbital findings. Other: No mastoid effusions. IMPRESSION: No evidence of acute intracranial abnormality. Electronically Signed   By: Feliberto Harts M.D.   On: 09/29/2023 01:54   DG Chest 2 View Result Date: 09/28/2023 CLINICAL DATA:  Shortness of breath EXAM: CHEST - 2 VIEW COMPARISON:  09/01/2022 FINDINGS: Cardiac shadow is prominent but stable. The lungs are  well aerated bilaterally. No focal infiltrate or effusion is seen. No bony abnormality is noted. IMPRESSION: No acute abnormality noted. Electronically Signed   By: Alcide Clever M.D.   On: 09/28/2023 22:26    Procedures Procedures    Medications Ordered in ED Medications  methocarbamol (ROBAXIN) injection 1,000 mg (1,000 mg Intravenous Given 09/29/23 0152)  LORazepam (ATIVAN) tablet 1 mg (1 mg Oral Given 09/29/23 0236)    ED Course/ Medical Decision Making/ A&P                                 Medical Decision Making Amount and/or Complexity of Data Reviewed Labs: ordered. Radiology: ordered.  Risk Prescription drug management.   Symptom complex which really seems most consistent with muscle  spasms in the neck.  However, she does have decreased sensation in her arm which could represent a stroke or cervical radiculopathy.  Also with chest pain, need to consider ACS.  I do not see any evidence of any vascular insufficiency.  I have reviewed her electrocardiogram, and my interpretation is left axis deviation low voltage not significantly changed from prior.  Chest x-ray shows no acute cardiopulmonary process.  Have independently viewed the images, and agree with the radiologist's interpretation.  I have reviewed her laboratory tests, and my interpretation is mild renal insufficiency not changed from baseline, normal initial troponin, normal CBC.  I have ordered CT of head to look for evidence of stroke.  If negative, will proceed with MRI of head and cervical spine.  I am ordering an dose of methocarbamol to see if she get some symptomatic relief from that.  I have reviewed her past records, and note cardiac catheterization on 03/04/2022 showing no significant coronary atherosclerosis, CT angiogram of the head and neck on 01/27/2021 showed no significant atherosclerotic disease.  CT of head shows no acute findings.  Have independently viewed the images, and agree with radiologist's  interpretation.  I am ordering MRI scan to rule out stroke, also MRI of the cervical spine to look for evidence of cervical radiculopathy or myelopathy.  I am ordering a dose of lorazepam for sedation for her MRI scans.  MRI shows no evidence of stroke, no significant cervical spine abnormality.  She actually feels significantly better following above-noted medications.  I do believe her symptoms are from torticollis.  She had been given a prescription for low-dose of tizanidine which had not been helpful.  Since she got relief from methocarbamol, I am ordering a prescription for methocarbamol and have advised her to apply ice.  I have recommended follow-up with PCP to consider physical therapy.  Return if symptoms are not being adequately controlled at home.  Final Clinical Impression(s) / ED Diagnoses Final diagnoses:  Acquired torticollis  Chronic anticoagulation  Renal insufficiency    Rx / DC Orders ED Discharge Orders          Ordered    methocarbamol (ROBAXIN) 500 MG tablet  Every 6 hours PRN        09/29/23 0435              Dione Booze, MD 09/29/23 [phone]

## 2023-09-30 ENCOUNTER — Ambulatory Visit: Payer: Medicare HMO | Admitting: Internal Medicine

## 2023-10-01 ENCOUNTER — Ambulatory Visit: Payer: Self-pay

## 2023-10-01 NOTE — Telephone Encounter (Signed)
 Chief Complaint: neck and ear pain Symptoms: left sided head/neck/ear pain Frequency: worsening and becoming more frequent over the past 2 weeks Pertinent Negatives: Patient denies exercise or injury to affected area. Disposition: [] ED /[] Urgent Care (no appt availability in office) / [x] Appointment(In office/virtual)/ []  [facility]/ [] Home Care/ [] Refused Recommended Disposition /[]  Mobile Bus/ []  Follow-up with PCP Additional Notes: Patient states she had a recent upper respiratory infection on 09/16/23 and was placed on an antibiotic. She states she feels like it cleared up but came back. She went to ED on 09/28/23 for symptoms. She had EKG, CT and MRI that ruled out stroke or heart attack. Patient states she is concerned about her ear and neck pain. Advised patient hospital follow up is needed, she states she thought we would be able to get her in to her neurologist quicker. Scheduled patient for HFU Monday with Dr Alvis Lemmings as her PCP does not have any availability until middle of May. Patient became upset and states she thought nurse triage would be able to call her neurologist and move up her appointment with them sooner since she went to the ED, instead of coming in to see her PCP. She states she does not think the PCP can do much for her. She mentioned she used to receive Botox injections from the neurologist for these symptoms and it helped but became too expensive. Patient states she would like a call back from clinic.  Copied from CRM [id number]. Topic: Clinical - Red Word Triage >> Oct 01, 2023 11:16 AM Carlatta H wrote: [facility] that prompted transfer to Nurse Triage: Patient is having constant sever pain inside left ear that travels to neck//Hands also get numb// Reason for Disposition  Weakness of an arm or hand  Answer Assessment - Initial Assessment Questions 1. ONSET: "When did the pain begin?"      6 months or more, but becoming more frequent over the last two  weeks.  2. LOCATION: "Where does it hurt?"      Left ear, left neck, left side of head.  3. PATTERN "Does the pain come and go, or has it been constant since it started?"      Comes and goes, but becoming more frequent.   4. SEVERITY: "How bad is the pain?"  (Scale 1-10; or mild, moderate, severe)   - NO PAIN (0): no pain or only slight stiffness    - MILD (1-3): doesn't interfere with normal activities    - MODERATE (4-7): interferes with normal activities or awakens from sleep    - SEVERE (8-10):  excruciating pain, unable to do any normal activities      10/10, she states she took a tizanidine last night. Nothing today to treat pain she states "I don't wanna just treat the pain and be walking around drunk"  5. RADIATION: "Does the pain go anywhere else, shoot into your arms?"     Left arm.  6. CORD SYMPTOMS: "Any weakness or numbness of the arms or legs?"     Numbness in left hand.  7. CAUSE: "What do you think is causing the neck pain?"     Unsure.  8. NECK OVERUSE: "Any recent activities that involved turning or twisting the neck?"     Denies.  9. OTHER SYMPTOMS: "Do you have any other symptoms?" (e.g., headache, fever, chest pain, difficulty breathing, neck swelling)     Hot flashes.  10. PREGNANCY: "Is there any chance you are pregnant?" "When was your  last menstrual period?"       N/A.  Protocols used: Neck Pain or Stiffness-A-AH

## 2023-10-01 NOTE — Telephone Encounter (Signed)
 Called & spoke to the patient. Verified name & DOB. Informed patient that we are unable to move any appointment outside of our department. Advised to give neurology a call to see if there are any cancellations and if not to be put on a wait-list in case there is a last minute cancellation she can be contacted to be rescheduled for a sooner appointment. Confirmed upcoming hfu appointment with Dr.Newlin. Patient expressed verbal understanding.

## 2023-10-04 ENCOUNTER — Inpatient Hospital Stay: Admitting: Family Medicine

## 2023-10-04 ENCOUNTER — Telehealth: Payer: Self-pay | Admitting: *Deleted

## 2023-10-04 DIAGNOSIS — T169XXA Foreign body in ear, unspecified ear, initial encounter: Secondary | ICD-10-CM

## 2023-10-04 NOTE — Telephone Encounter (Signed)
 Copied from CRM [id number]. Topic: Referral - Request for Referral >> Oct 04, 2023 11:53 AM Marlow Baars wrote: Did the patient discuss referral with their provider in the last year? No (If No - schedule appointment) (If Yes - send message)  Appointment offered? Yes  Type of order/referral and detailed reason for visit: She states she feels like something has been in her ear and it has really bothered her  Preference of office, provider, location: A local ENT that accepts her insurance and that her provider recommends  If referral order, have you been seen by this specialty before? No (If Yes, this issue or another issue? When? Where?  Can we respond through MyChart? No  Please assist patient further as she feels like all the notes from the Urgent care, [facility] and office visit should be enough to send this referral. She did not want to come in again and waste the insurance money she says.

## 2023-10-04 NOTE — Telephone Encounter (Signed)
 Referral has been placed.

## 2023-10-06 NOTE — Telephone Encounter (Signed)
 Patient made aware, advised that she shuld get a call w/i 2 weeks for an appt.

## 2023-10-07 ENCOUNTER — Other Ambulatory Visit: Payer: Self-pay | Admitting: Cardiology

## 2023-10-07 DIAGNOSIS — I10 Essential (primary) hypertension: Secondary | ICD-10-CM

## 2023-10-18 ENCOUNTER — Other Ambulatory Visit: Payer: Self-pay

## 2023-10-18 ENCOUNTER — Other Ambulatory Visit (HOSPITAL_BASED_OUTPATIENT_CLINIC_OR_DEPARTMENT_OTHER): Payer: Self-pay

## 2023-10-18 ENCOUNTER — Other Ambulatory Visit: Payer: Self-pay | Admitting: Internal Medicine

## 2023-10-19 ENCOUNTER — Other Ambulatory Visit: Payer: Self-pay

## 2023-10-20 ENCOUNTER — Other Ambulatory Visit: Payer: Self-pay

## 2023-10-22 ENCOUNTER — Other Ambulatory Visit: Payer: Self-pay | Admitting: Cardiology

## 2023-10-28 ENCOUNTER — Other Ambulatory Visit: Payer: Self-pay

## 2023-10-28 ENCOUNTER — Ambulatory Visit: Admitting: Neurology

## 2023-10-28 ENCOUNTER — Encounter: Payer: Self-pay | Admitting: Neurology

## 2023-10-28 VITALS — BP 140/69 | HR 69 | Ht 64.0 in | Wt 227.0 lb

## 2023-10-28 DIAGNOSIS — G43009 Migraine without aura, not intractable, without status migrainosus: Secondary | ICD-10-CM

## 2023-10-28 MED ORDER — TOPIRAMATE 25 MG PO TABS
25.0000 mg | ORAL_TABLET | Freq: Two times a day (BID) | ORAL | 4 refills | Status: AC
  Filled 2023-10-28: qty 60, 30d supply, fill #0
  Filled 2024-04-10: qty 180, 90d supply, fill #0
  Filled 2024-07-04: qty 180, 90d supply, fill #1

## 2023-10-28 MED ORDER — TOPIRAMATE 50 MG PO TABS
50.0000 mg | ORAL_TABLET | Freq: Two times a day (BID) | ORAL | 4 refills | Status: AC
  Filled 2023-10-28: qty 60, 30d supply, fill #0
  Filled 2024-04-10: qty 180, 90d supply, fill #0
  Filled 2024-07-04: qty 180, 90d supply, fill #1

## 2023-10-28 MED ORDER — UBRELVY 100 MG PO TABS
ORAL_TABLET | ORAL | 11 refills | Status: AC
  Filled 2023-10-28: qty 16, 8d supply, fill #0
  Filled 2023-11-12: qty 16, 30d supply, fill #0
  Filled 2023-12-17: qty 16, 30d supply, fill #1
  Filled 2024-01-18 (×2): qty 16, 30d supply, fill #2
  Filled 2024-03-24: qty 16, 30d supply, fill #3
  Filled 2024-04-21: qty 16, 30d supply, fill #4
  Filled 2024-05-22: qty 16, 30d supply, fill #5
  Filled 2024-06-22: qty 16, 30d supply, fill #6
  Filled 2024-08-02: qty 16, 30d supply, fill #7

## 2023-10-28 NOTE — Progress Notes (Signed)
 [facility]    Provider:  Dr Tresia Fruit Referring Provider: Lawrance Presume, MD Primary Care Physician:  Lawrance Presume, MD  CC:  Chronic headaches  10/28/2023: She has been doing well on current management. Recently had MRI cervical spine due to neck pain and she felt like she was getting ready to have a stroke and her left ear was stuffed up she was worried she was having something burst in her head. Discussed white matter changes in the brain, needs to control vascular risk factors like diabetes, HTN, cholesterol. She is doing well, migraines/headaches are well controlled, she has migraines 4 times a month and < 8 total headache days a month but she take the ubrelvy  and it is gone so she is doing well. Overall she is feeling well.   No other focal neurologic deficits, associated symptoms, inciting events or modifiable factors.  Patient complains of symptoms per HPI as well as the following symptoms: none . Pertinent negatives and positives per HPI. All others negative  Reviewed images personally and with patient and agree with the following   09/29/2023: EXAM: MRI HEAD WITHOUT CONTRAST   MRI CERVICAL SPINE WITHOUT CONTRAST   TECHNIQUE: Multiplanar, multiecho pulse sequences of the brain and surrounding structures, and cervical spine, to include the craniocervical junction and cervicothoracic junction, were obtained without intravenous contrast.   COMPARISON:  01/27/2021 head and cervical spine CT. Head CT from earlier today   FINDINGS: MRI HEAD FINDINGS   Brain: No acute infarction, hemorrhage, hydrocephalus, extra-axial collection or mass lesion. Fairly extensive FLAIR hyperintensity in the deep cerebral white matter and pons attributed to chronic small vessel ischemia. Mild for age cerebral volume loss. Major flow voids are preserved   Vascular: Normal flow voids.   Skull and upper cervical spine: Normal marrow signal.   Sinuses/Orbits:  Negative.   MRI CERVICAL SPINE FINDINGS   Alignment: Straightening of cervical lordosis.   Vertebrae: No fracture, evidence of discitis, or bone lesion.   Cord: Normal signal and morphology.   Posterior Fossa, vertebral arteries, paraspinal tissues: Negative.   Disc levels:   C2-3: Unremarkable.   C3-4: Unremarkable.   C4-5: Mild disc narrowing and circumferential bulging. Mild facet spurring on the right   C5-6: Greatest level of degenerative disc narrowing with bulging and ridging. Uncovertebral spurs cause bilateral foraminal impingement more advanced on the right. Patent spinal canal   C6-7: Disc narrowing and bulging. Negative facets. No neural compression.   C7-T1:Unremarkable.   IMPRESSION: Brain MRI:   No emergent finding.   Extensive chronic small vessel ischemia.   Cervical spine:   Ordinary degeneration most notable at C5-6 where there is right more than left foraminal impingement, advanced on the right.   Diffusely patent spinal canal   HPI:  Heidi [name] is a 73 y.o. female here as a referral from Dr. Lincoln Renshaw for chronic headaches, PMHx chronic migraines,  she has been traveling to [facility] to get botox  last one in September. Botox  helps a lot. She takes Topamax  which helps as well. She was taking hydrocodone  for her headaches as well but she came off of that and not taking that anymore. No medication overuse. No aura. She is currently on atenolol , topiramate  and baclofen for migraines. Her headaches start on the left and spread to the whole scalp it is painful, pressure and pounding and hurts, with light and sound sensitivity. Started in 01/2014 after a fall. She hit her head. She has daily headaches still with >8  migrainous however the botox  significantly helped reduce her headache burden by 100 hours a month and she would like to continue here. Migraines used to be more frequent almost every day and last longer up to the whole day, now only 8 a  month. She still has daily headaches for a few hours but it used to be all day. Triggers are being active. She is having more headaches now since her botox  is wearing off. Before the headaches she was having daily severe headaches all day long. No aura. She used to have sleep apnea but lost a lot of weight and denies snoring or daily somnolence. No other focal neurologic deficits or concerns.  Reviewed notes, labs and imaging from outside physicians, which showed:  Personally reviewed images and agree with the following:   MRI cervical spine 04/2016:  Vertebrae: The vertebral body heights maintained. Abnormal heterogeneous appearance of the vertebral body bone marrow, similar to previous study. Again, finding is like related to red marrow conversion related to underlying chronic anemia or possibly hypoxemia. Lymphoproliferative disorder could also have this appearance.   Cord: Signal intensity within the cervical spinal cord is normal.   Posterior Fossa, vertebral arteries, paraspinal tissues: Probable chronic microvascular ischemic changes partially visualized within the pons. Partially visualized brain and posterior fossa are otherwise unremarkable. Craniocervical junction within normal limits. Paraspinous and prevertebral soft tissues within normal limits. Major intracranial vascular flow voids maintained within the vertebral arteries.   Disc levels:   C2-C3: Negative.   C3-C4: Mild bilateral uncovertebral hypertrophy with minimal disc bulge. No significant stenosis.   C4-C5: Degenerative intervertebral disc space narrowing with diffuse disc bulge. Mild bilateral uncovertebral hypertrophy. No significant stenosis.   C5-C6: Degenerative intervertebral disc space narrowing with diffuse disc bulge and bilateral uncovertebral spurring. Resultant moderate bilateral foraminal narrowing, right worse than left. Changes are similar to previous. Broad-based posterior disc osteophyte  flattens the ventral thecal sac without significant canal stenosis.   C6-C7: Degenerative intervertebral disc space narrowing with bilateral uncovertebral spurring. Mild bilateral foraminal narrowing. No significant canal stenosis.   C7-T1:  Negative.   The partially visualized upper thoracic spine demonstrates no significant degenerative changes are stenosis.   IMPRESSION: 1. Stable appearance of the cervical spine with multilevel degenerative spondylolysis extending from C4-5 through C6-7, greatest at C5-6 where there is moderate bilateral foraminal stenosis. 2. Abnormal marrow, likely related to red marrow reconversion due to chronic anemia or hypoxemia, similar to previous. Again, lymphoproliferative disorder could also have this appearance.  MRI brain 2015(report only)  IMPRESSION:  1. No discernible MRI evidence of an acute or subacute intracranial process.  2. Mild senescent changes, as above (2nd paragraph)  --  typical for age.  Result Narrative  MRI of the brain with and without gadolinium enhancement, 06/27/2014 11:30 AM.  History: Severe headache; new bilateral arm pain; hypoattenuating focus on CT of the brain obtained 10 May 2014, as yet incompletely characterized.  Technique: Multiplanar, multisequence images of the brain were obtained with and without gadolinium enhancement. 12 cc of Gadavist were administered.  Comparison: CT, 10 May 2014.   FINDINGS:  There is no MRI correlate to the finding on the CT of 10 May 2014  --  this instead pertain to artifact related to slice selection.  There are moderately extensive supratentorial and pontine white matter signal abnormalities consequent to chronic microvascular ischemic demyelination. There is slight bilateral frontal lobe predominant volume loss.  There is no compelling MRI evidence of masses, edema, infarction, hemorrhage, extra-axial  fluid collections, midline abnormality, hydrocephalus,  herniation, or unexpected enhancement. The major vascular structures are grossly unremarkable.  There is mild mucosal thickening throughout the ethmoid air cells. The left frontal sinus is hypoplastic, a normal variant. The paranasal sinuses are otherwise well-aerated as are the middle tympanic cavities and the mastoid air cells.    BMP 06/2016: BUN 21, creatinine 1.41, cbc nml  Review of Systems: Patient complains of symptoms per HPI as well as the following symptoms: no CP, no SOB. Pertinent negatives per HPI. All others negative.   Social History   Socioeconomic History   Marital status: Widowed    Spouse name: Not on file   Number of children: 4   Years of education: Not on file   Highest education level: Not on file  Occupational History   Occupation: Disabled  Tobacco Use   Smoking status: Never    Passive exposure: Never   Smokeless tobacco: Never  Vaping Use   Vaping status: Never Used  Substance and Sexual Activity   Alcohol  use: No   Drug use: No   Sexual activity: Yes  Other Topics Concern   Not on file  Social History Narrative   Lives with daughter   Caffeine use: 1 cup coffee per day   Social Drivers of Health   Financial Resource Strain: Medium Risk (08/19/2023)   Overall Financial Resource Strain (CARDIA)    Difficulty of Paying Living Expenses: Somewhat hard  Food Insecurity: Food Insecurity Present (08/19/2023)   Hunger Vital Sign    Worried About Running Out of Food in the Last Year: Sometimes true    Ran Out of Food in the Last Year: Sometimes true  Transportation Needs: No Transportation Needs (08/19/2023)   PRAPARE - Administrator, Civil Service (Medical): No    Lack of Transportation (Non-Medical): No  Physical Activity: Inactive (08/19/2023)   Exercise Vital Sign    Days of Exercise per Week: 0 days    Minutes of Exercise per Session: 0 min  Stress: No Stress Concern Present (08/19/2023)   [employer] of Occupational Health -  Occupational Stress Questionnaire    Feeling of Stress : Only a little  Social Connections: Moderately Integrated (08/19/2023)   Social Connection and Isolation Panel [NHANES]    Frequency of Communication with Friends and Family: More than three times a week    Frequency of Social Gatherings with Friends and Family: Once a week    Attends Religious Services: More than 4 times per year    Active Member of [employer] or Organizations: Yes    Attends Engineer, structural: More than 4 times per year    Marital Status: Divorced  Intimate Partner Violence: Not At Risk (08/19/2023)   Humiliation, Afraid, Rape, and Kick questionnaire    Fear of Current or Ex-Partner: No    Emotionally Abused: No    Physically Abused: No    Sexually Abused: No    Family History  Problem Relation Age of Onset   Colon cancer Father    Diabetes type II Sister    Colon cancer Sister    Diabetes type II Brother    Migraines Neg Hx    Breast cancer Neg Hx     Past Medical History:  Diagnosis Date   Adenomatous colon polyp    Anemia    Anxiety    Asthma    CHF (congestive heart failure) (HCC)    COPD (chronic obstructive pulmonary disease) (HCC)  Diabetes mellitus without complication (HCC)    High cholesterol    Hypertension    Left knee injury    Ocular hypertension 08/12/2022   OD   Pneumonia    Sleep apnea    Supplemental oxygen  dependent    2L [place]    Past Surgical History:  Procedure Laterality Date   ABDOMINAL HYSTERECTOMY     CATARACT EXTRACTION Left    CATARACT EXTRACTION     LT 01/2017, 02/2017 on RT   CESAREAN SECTION     COLONOSCOPY WITH PROPOFOL  N/A 04/15/2021   Procedure: COLONOSCOPY WITH PROPOFOL ;  Surgeon: Tobin Forts, MD;  Location: [facility];  Service: Endoscopy;  Laterality: N/A;  patient is on O2   JOINT REPLACEMENT Left    Plate in Ankle   LEFT HEART CATH AND CORONARY ANGIOGRAPHY N/A 03/04/2022   Procedure: LEFT HEART CATH AND CORONARY ANGIOGRAPHY;  Surgeon:  Cody Das, MD;  Location: [facility];  Service: Cardiovascular;  Laterality: N/A;   POLYPECTOMY  04/15/2021   Procedure: POLYPECTOMY;  Surgeon: Tobin Forts, MD;  Location: [facility];  Service: Endoscopy;;    Current Outpatient Medications  Medication Sig Dispense Refill   Accu-Chek Softclix Lancets lancets USE AS DIRECTED 400 each 3   acetaminophen  (TYLENOL ) 500 MG tablet Take 500-1,000 mg by mouth every 6 (six) hours as needed (pain).     albuterol  (PROVENTIL ) (2.5 MG/3ML) 0.083% nebulizer solution Take 3 mLs (2.5 mg total) by nebulization every 6 (six) hours as needed for wheezing or shortness of breath. 75 mL 5   albuterol  (VENTOLIN  HFA) 108 (90 Base) MCG/ACT inhaler Inhale 1-2 puffs into the lungs every 4 (four) hours as needed for wheezing or shortness of breath. 18 g 5   Alcohol  Swabs  (DROPSAFE ALCOHOL  PREP) 70 % PADS USE AS DIRECTED 300 each 3   apixaban  (ELIQUIS ) 5 MG TABS tablet Take 1 tablet (5 mg total) by mouth 2 (two) times daily. 180 tablet 1   atenolol  (TENORMIN ) 25 MG tablet TAKE 1 TABLET EVERY DAY 30 tablet 0   atorvastatin  (LIPITOR) 40 MG tablet TAKE 1 TABLET BY MOUTH EVERY DAY 90 tablet 2   Blood Glucose Calibration (ACCU-CHEK GUIDE CONTROL) LIQD L1-L2 Control Solution. UAD 1 each 1   Blood Glucose Monitoring Suppl (ACCU-CHEK GUIDE) w/Device KIT UAD 1 kit 0   budesonide -formoterol  (SYMBICORT ) 80-4.5 MCG/ACT inhaler Inhale 2 puffs into the lungs in the morning and at bedtime. 1 each 5   diltiazem  (CARDIZEM  CD) 120 MG 24 hr capsule Take 1 capsule (120 mg total) by mouth daily. 90 capsule 0   empagliflozin  (JARDIANCE ) 10 MG TABS tablet Take 1 tablet (10 mg total) by mouth daily. 90 tablet 1   famotidine  (PEPCID ) 20 MG tablet Take 1 tablet (20 mg total) by mouth daily. 90 tablet 1   furosemide  (LASIX ) 40 MG tablet Take 1 tablet (40 mg total) by mouth daily. 90 tablet 3   glucose blood (ACCU-CHEK GUIDE) test strip Test blood sugars 3-4 times a day with  meals and at bedtime. E11.69 100 each 12   insulin  lispro protamine-lispro (HUMALOG  MIX 75/25) (75-25) 100 UNIT/ML SUSP injection Inject 0.55 mLs (55 Units total) into the skin every morning AND 0.45 mLs (45 Units total) every evening. 30 mL 6   Insulin  Syringe-Needle U-100 (INSULIN  SYRINGE 1CC/31GX5/16") 31G X 5/16" 1 ML MISC Use to inject Humalog  75/25 mix twice daily 200 each 6   ipratropium (ATROVENT ) 0.03 % nasal spray Place 2 sprays  into both nostrils every 12 (twelve) hours. 30 mL 0   loratadine  (CLARITIN ) 10 MG tablet Take 1 tablet (10 mg total) by mouth daily as needed for allergies. 90 tablet 0   losartan  (COZAAR ) 50 MG tablet TAKE 1 TABLET EVERY DAY 90 tablet 3   methocarbamol  (ROBAXIN ) 500 MG tablet Take 1 tablet (500 mg total) by mouth every 6 (six) hours as needed for muscle spasms. 40 tablet 0   montelukast  (SINGULAIR ) 10 MG tablet Take 1 tablet (10 mg total) by mouth at bedtime. 90 tablet 2   Multiple Vitamin (MULTIVITAMIN WITH MINERALS) TABS tablet Take 1 tablet by mouth in the morning. Centrum Silver     nitroGLYCERIN  (NITROSTAT ) 0.3 MG SL tablet DISSOLVE 1 TABLET UNDER THE  TONGUE EVERY 5 MINUTES AS NEEDED FOR CHEST PAIN. MAX OF 3 TABLETS IN 15 MINUTES. CALL 911 IF PAIN  PERSISTS. Strength: 0.3 mg 100 tablet 1   OXYGEN  Inhale 3 L into the lungs continuous.     potassium chloride  SA (KLOR-CON  M) 20 MEQ tablet TAKE 1 TABLET EVERY DAY 90 tablet 0   Pseudoeph-Doxylamine-DM-APAP (NYQUIL PO) Take by mouth as needed.     topiramate  (TOPAMAX ) 25 MG tablet Take 1 tablet (25 mg total) by mouth 2 (two) times daily. Take with 50 mg tablets for total of 75 mg daily. 180 tablet 4   topiramate  (TOPAMAX ) 50 MG tablet Take 1 tablet (50 mg total) by mouth 2 (two) times daily. Take with 25mg  for total of 75mg  TWICE DAILY. 180 tablet 4   Ubrogepant  (UBRELVY ) 100 MG TABS Take 1 tablet onset migraine, may take another tablet if needed 2 hours later. (2 tab max/daily) 16 tablet 11   No current  facility-administered medications for this visit.    Allergies as of 10/28/2023 - Review Complete 10/28/2023  Allergen Reaction Noted   Januvia  [sitagliptin ]  06/09/2019   Tramadol  Other (See Comments) 01/30/2021    Vitals: BP (!) 140/69   Pulse 69   Ht 5\' 4"  (1.626 m)   Wt 227 lb (103 kg)   BMI 38.96 kg/m  Last Weight:  Wt Readings from Last 1 Encounters:  10/28/23 227 lb (103 kg)   Last Height:   Ht Readings from Last 1 Encounters:  10/28/23 5\' 4"  (1.626 m)   Exam: NAD, pleasant                  Speech:    Speech is normal; fluent and spontaneous with normal comprehension.  Cognition:    The patient is oriented to person, place, and time;     recent and remote memory intact;     language fluent;    Cranial Nerves:    The pupils are equal, round, and reactive to light.Trigeminal sensation is intact and the muscles of mastication are normal. The face is symmetric. The palate elevates in the midline. Hearing intact. Voice is normal. Shoulder shrug is normal. The tongue has normal motion without fasciculations.   Coordination:  No dysmetria  Motor Observation:    No asymmetry, no atrophy, and no involuntary movements noted. Tone:    Normal muscle tone.     Strength:    Strength is V/V in the upper and lower limbs.      Sensation: intact to LT      Assessment/Plan:  73 year old with chronic migraines doing well on Topiramate , atenolol  and ubrelvy   - continue medications  Meds ordered this encounter  Medications   topiramate  (TOPAMAX ) 25  MG tablet    Sig: Take 1 tablet (25 mg total) by mouth 2 (two) times daily. Take with 50 mg tablets for total of 75 mg daily.    Dispense:  180 tablet    Refill:  4   topiramate  (TOPAMAX ) 50 MG tablet    Sig: Take 1 tablet (50 mg total) by mouth 2 (two) times daily. Take with 25mg  for total of 75mg  TWICE DAILY.    Dispense:  180 tablet    Refill:  4   Ubrogepant  (UBRELVY ) 100 MG TABS    Sig: Take 1 tablet onset migraine,  may take another tablet if needed 2 hours later. (2 tab max/daily)    Dispense:  16 tablet    Refill:  11     - discussed: To prevent or relieve headaches, try the following: Cool Compress. Lie down and place a cool compress on your head.  Avoid headache triggers. If certain foods or odors seem to have triggered your migraines in the past, avoid them. A headache diary might help you identify triggers.  Include physical activity in your daily routine. Try a daily walk or other moderate aerobic exercise.  Manage stress. Find healthy ways to cope with the stressors, such as delegating tasks on your to-do list.  Practice relaxation techniques. Try deep breathing, yoga, massage and visualization.  Eat regularly. Eating regularly scheduled meals and maintaining a healthy diet might help prevent headaches. Also, drink plenty of fluids.  Follow a regular sleep schedule. Sleep deprivation might contribute to headaches Consider biofeedback. With this mind-body technique, you learn to control certain bodily functions -- such as muscle tension, heart rate and blood pressure -- to prevent headaches or reduce headache pain.    Proceed to emergency room if you experience new or worsening symptoms or symptoms do not resolve, if you have new neurologic symptoms or if headache is severe, or for any concerning symptom.   Cc: Funches, Josalyn, MD  Aldona Amel, MD  [facility] [address] [place], [place] [address]  Phone [phone] Fax [phone]  I spent 20 minutes of face-to-face and non-face-to-face time with patient on the  1. Migraine without aura and without status migrainosus, not intractable    diagnosis.  This included previsit chart review, lab review, study review, order entry, electronic health record documentation, patient education on the different diagnostic and therapeutic options, counseling and coordination of care, risks and benefits of  management, compliance, or risk factor reduction

## 2023-10-29 ENCOUNTER — Other Ambulatory Visit: Payer: Self-pay

## 2023-11-02 DIAGNOSIS — E103492 Type 1 diabetes mellitus with severe nonproliferative diabetic retinopathy without macular edema, left eye: Secondary | ICD-10-CM | POA: Diagnosis not present

## 2023-11-02 DIAGNOSIS — E103511 Type 1 diabetes mellitus with proliferative diabetic retinopathy with macular edema, right eye: Secondary | ICD-10-CM | POA: Diagnosis not present

## 2023-11-02 DIAGNOSIS — H401112 Primary open-angle glaucoma, right eye, moderate stage: Secondary | ICD-10-CM | POA: Diagnosis not present

## 2023-11-02 DIAGNOSIS — H04123 Dry eye syndrome of bilateral lacrimal glands: Secondary | ICD-10-CM | POA: Diagnosis not present

## 2023-11-02 LAB — HM DIABETES EYE EXAM

## 2023-11-09 ENCOUNTER — Other Ambulatory Visit: Payer: Self-pay

## 2023-11-12 ENCOUNTER — Other Ambulatory Visit: Payer: Self-pay

## 2023-11-14 ENCOUNTER — Other Ambulatory Visit: Payer: Self-pay | Admitting: Cardiology

## 2023-11-14 DIAGNOSIS — I10 Essential (primary) hypertension: Secondary | ICD-10-CM

## 2023-11-15 ENCOUNTER — Other Ambulatory Visit: Payer: Self-pay

## 2023-11-15 MED ORDER — ZOSTER VAC RECOMB ADJUVANTED 50 MCG/0.5ML IM SUSR
0.5000 mL | Freq: Once | INTRAMUSCULAR | 0 refills | Status: AC
  Filled 2023-11-15: qty 1, 1d supply, fill #0

## 2023-11-18 ENCOUNTER — Other Ambulatory Visit: Payer: Self-pay | Admitting: Internal Medicine

## 2023-11-18 ENCOUNTER — Other Ambulatory Visit: Payer: Self-pay

## 2023-11-18 DIAGNOSIS — M545 Low back pain, unspecified: Secondary | ICD-10-CM

## 2023-11-19 ENCOUNTER — Other Ambulatory Visit: Payer: Self-pay

## 2023-11-21 ENCOUNTER — Other Ambulatory Visit: Payer: Self-pay | Admitting: Internal Medicine

## 2023-11-22 ENCOUNTER — Other Ambulatory Visit: Payer: Self-pay

## 2023-11-23 ENCOUNTER — Other Ambulatory Visit: Payer: Self-pay

## 2023-11-23 NOTE — Progress Notes (Signed)
 Cardiology Clinic Note   Date: 11/25/2023 ID: Heidi [name] 05-09-1951, MRN [id number]  Primary Cardiologist:  Cody Das, MD  Chief Complaint   Heidi [name] is a 73 y.o. female who presents to the clinic today for routine follow up.   Patient Profile   Heidi [name] is followed by Dr. Filiberto Hug for the history outlined below.      Past medical history significant for: Nonobstructive CAD. LHC 03/04/2022: Calcification medial LAD, minimal luminal irregularities.  Recommend long-acting nitrate to help with coronary vasospasm or endothelial dysfunction. Chronic diastolic heart failure. Echo 03/03/2022: EF 60 to 65%.  No RWMA.  Moderate LVH.  Normal diastolic parameters.  Normal RV size/function.  No significant valvular abnormalities. PAF. Onset September 2023. 10-day ZIO 04/03/2022: HR 51 to 109 bpm, average 74 bpm.  A-fib burden 42% with ventricular rate 65 to 152 bpm, average 79 bpm.  Rare ectopy.  2 patient triggered events correlated with A-fib. Hypertension. Hyperlipidemia. Lipid panel 08/19/2023: LDL 40, HDL 50, TG 165, total 117. OSA. On BiPAP. Asthma. GERD. T2DM. Dizziness/presyncope.  In summary, patient establish care with Dr. Filiberto Hug on 11/22/2019 for chest pain and shortness of breath.  She had a hospital admission in February 2021 for chest pain and shortness of breath with an unremarkable workup.  She is on supplemental home O2 for history of hypoxia.  Echo February 2021 demonstrated EF 65 to 70%, mild concentric LVH, normal RV function, mild RVH, no significant valvular abnormalities.  Lexiscan  June 2021 was a normal, low risk study.  Upon follow-up in August 2022 patient complained of episodic presyncope.  She wore a 6-day ZIO that demonstrated 2 episodes of SVT and no other sustained arrhythmias.  In August 2023 patient underwent LHC during hospital admission for chest pain which demonstrated minimal nonobstructive CAD.  Echo at that time  demonstrated normal LV/RV function as detailed above.  During hospital admission patient developed A-fib with RVR.  She had no cardiac awareness of arrhythmia.  She spontaneously converted to sinus rhythm.  She was discharged on diltiazem  and Eliquis .  Outpatient monitoring demonstrated 42% A-fib burden as detailed above.  Patient was last seen in the office by Dr. Filiberto Hug on 11/27/2022 for routine follow-up.  She was maintaining sinus rhythm at that time and rate control discontinued.     History of Present Illness    Today, patient is accompanied by her daughter. Patient denies shortness of breath, dyspnea on exertion, orthopnea or PND. No chest pain, pressure, or tightness. No palpitations.  Patient reports about 1.5 weeks ago she had increased lower extremity edema. She took her Lasix  but feels it was not working because she did not have increased urination. She then took a dose of her daughter's Lasix  (the same dose) and had brisk urination with resolution of edema. She has been working on improving her diet. She has been performing seated exercises daily and has noticed improved walking tolerance. She is pending 3 dental extractions.       ROS: All other systems reviewed and are otherwise negative except as noted in History of Present Illness.  EKGs/Labs Reviewed    EKG Interpretation Date/Time:  Thursday Nov 25 2023 11:10:22 EDT Ventricular Rate:  69 PR Interval:  180 QRS Duration:  80 QT Interval:  384 QTC Calculation: 411 R Axis:   -17  Text Interpretation: Normal sinus rhythm Low voltage QRS Cannot rule out Anterior infarct (cited on or before 28-Sep-2023) When compared with ECG of 28-Sep-2023 22:09,  No significant change was found Confirmed by Morey Ar [phone]) on 11/25/2023 11:16:10 AM   08/19/2023: ALT 21; AST 17 09/28/2023: BUN 19; Creatinine, Ser 1.18; Potassium 3.8; Sodium 137   09/28/2023: Hemoglobin 12.9; WBC 10.1    Risk Assessment/Calculations      CHA2DS2-VASc Score = 5   This indicates a 7.2% annual risk of stroke. The patient's score is based upon: CHF History: 1 HTN History: 1 Diabetes History: 1 Stroke History: 0 Vascular Disease History: 0 Age Score: 1 Gender Score: 1             Physical Exam    VS:  BP 120/60 (BP Location: Left Arm, Patient Position: Sitting, Cuff Size: Large)   Pulse 69   Ht 5\' 4"  (1.626 m)   Wt 226 lb (102.5 kg)   SpO2 94%   BMI 38.79 kg/m  , BMI Body mass index is 38.79 kg/m.  GEN: Well nourished, well developed, in no acute distress. Neck: No JVD or carotid bruits. Cardiac:  RRR. No murmurs. No rubs or gallops.   Respiratory:  Respirations regular and unlabored. Clear to auscultation without rales, wheezing or rhonchi. GI: Soft, nontender, nondistended. Extremities: Radials/DP/PT 2+ and equal bilaterally. No clubbing or cyanosis. No edema.  Skin: Warm and dry, no rash. Neuro: Strength intact.  Assessment & Plan   Nonobstructive CAD LHC August 2023 showed calcification medial LAD and minimal luminal irregularities.  Patient denies chest pain, pressure or tightness. She has been performing daily seated exercise and has noticed her walking tolerance has improved. She is also working on improving her diet.  - Advance physical activity as tolerated.  - Continue atenolol , atorvastatin .  Not on aspirin  secondary to Eliquis .  Chronic diastolic heart failure Echo August 2023 demonstrated normal LV/RV function, no RWMA, moderate LVH, no significant valvular abnormalities.  Patient reports about 1.5 weeks ago she had increased lower extremity edema. She took her prescribed Lasix  but felt it was not working secondary to not having increased urination. She then took a dose of her daughter's Lasix  (same dose) and had brisk diuresis and resolution of edema. Will renew patient's Lasix  today. No lower extremity edema today.  Euvolemic and well compensated on exam. - Continue losartan , Lasix ,  Jardiance , atenolol .  PAF Onset September 2023.  10-day ZIO September 2023 demonstrated 42% A-fib burden.  Denies spontaneous bleeding concerns.  Patient denies palpitations. EKG today shows NSR.  - Continue atenolol , diltiazem , Eliquis .  Hypertension BP today 120/60. No report of headaches or dizziness.  - Continue losartan , atenolol , diltiazem .  Hyperlipidemia LDL 40 February 2025, at goal. - Continue atorvastatin .  Preoperative cardiovascular risk assessment  Patient is pending 3 dental extractions with Dr. Deborra Falter on 5/29. According to the RCRI, patient has a 6.6% risk of MACE. Patient reports activity equivalent to 4.0 METS (performing seated exercises daily).  -Based on ACC/AHA guidelines, Heidi [name] would be an acceptable risk for the planned procedure without further cardiovascular testing.  - No cardiac contraindications for local with epi but will defer to Dr. Deborra Falter.  - Per Pharm D, patient may hold Eliquis  for 1 day prior to procedure. Patient will need to be informed of this hold by dental office as she had already completed her visit when recommendations were provided.     Disposition: Return in 1 year or sooner as needed.          Signed, Lonell Rives. Sherwin Hollingshed, DNP, NP-C

## 2023-11-25 ENCOUNTER — Ambulatory Visit: Attending: Student | Admitting: Student

## 2023-11-25 ENCOUNTER — Encounter: Payer: Self-pay | Admitting: Student

## 2023-11-25 ENCOUNTER — Telehealth: Payer: Self-pay | Admitting: *Deleted

## 2023-11-25 VITALS — BP 120/60 | HR 69 | Ht 64.0 in | Wt 226.0 lb

## 2023-11-25 DIAGNOSIS — I5032 Chronic diastolic (congestive) heart failure: Secondary | ICD-10-CM

## 2023-11-25 DIAGNOSIS — E785 Hyperlipidemia, unspecified: Secondary | ICD-10-CM | POA: Diagnosis not present

## 2023-11-25 DIAGNOSIS — I10 Essential (primary) hypertension: Secondary | ICD-10-CM

## 2023-11-25 DIAGNOSIS — I2510 Atherosclerotic heart disease of native coronary artery without angina pectoris: Secondary | ICD-10-CM

## 2023-11-25 DIAGNOSIS — Z01810 Encounter for preprocedural cardiovascular examination: Secondary | ICD-10-CM | POA: Diagnosis not present

## 2023-11-25 DIAGNOSIS — I480 Paroxysmal atrial fibrillation: Secondary | ICD-10-CM

## 2023-11-25 MED ORDER — ATENOLOL 25 MG PO TABS: 25.0000 mg | ORAL_TABLET | Freq: Every day | ORAL | 3 refills | Status: AC

## 2023-11-25 MED ORDER — NITROGLYCERIN 0.3 MG SL SUBL: SUBLINGUAL_TABLET | SUBLINGUAL | 3 refills | Status: AC

## 2023-11-25 MED ORDER — FUROSEMIDE 40 MG PO TABS: 40.0000 mg | ORAL_TABLET | Freq: Every day | ORAL | 3 refills | Status: AC

## 2023-11-25 NOTE — Patient Instructions (Signed)
 Medication Instructions:  No changes at this time.   *If you need a refill on your cardiac medications before your next appointment, please call your pharmacy*  Lab Work: None  If you have labs (blood work) drawn today and your tests are completely normal, you will receive your results only by: MyChart Message (if you have MyChart) OR A paper copy in the mail If you have any lab test that is abnormal or we need to change your treatment, we will call you to review the results.  Testing/Procedures: None  Follow-Up: At [facility], you and your health needs are our priority.  As part of our continuing mission to provide you with exceptional heart care, our providers are all part of one team.  This team includes your primary Cardiologist (physician) and Advanced Practice Providers or APPs (Physician Assistants and Nurse Practitioners) who all work together to provide you with the care you need, when you need it.  Your next appointment:   1 year(s)  Provider:   Morey Ar, NP

## 2023-11-25 NOTE — Telephone Encounter (Signed)
 Patient seen in clinic today and provided cardiac clearance. Patient is having multiple extractions (x3 teeth). Sending to pharm D for instructions on Eliquis .

## 2023-12-01 ENCOUNTER — Other Ambulatory Visit: Payer: Self-pay

## 2023-12-09 DIAGNOSIS — H04123 Dry eye syndrome of bilateral lacrimal glands: Secondary | ICD-10-CM | POA: Diagnosis not present

## 2023-12-09 DIAGNOSIS — E103511 Type 1 diabetes mellitus with proliferative diabetic retinopathy with macular edema, right eye: Secondary | ICD-10-CM | POA: Diagnosis not present

## 2023-12-09 DIAGNOSIS — E103492 Type 1 diabetes mellitus with severe nonproliferative diabetic retinopathy without macular edema, left eye: Secondary | ICD-10-CM | POA: Diagnosis not present

## 2023-12-09 DIAGNOSIS — H401112 Primary open-angle glaucoma, right eye, moderate stage: Secondary | ICD-10-CM | POA: Diagnosis not present

## 2023-12-17 ENCOUNTER — Encounter: Payer: Self-pay | Admitting: Internal Medicine

## 2023-12-17 ENCOUNTER — Other Ambulatory Visit: Payer: Self-pay | Admitting: Internal Medicine

## 2023-12-17 ENCOUNTER — Ambulatory Visit: Payer: Medicare HMO | Attending: Internal Medicine | Admitting: Internal Medicine

## 2023-12-17 ENCOUNTER — Other Ambulatory Visit: Payer: Self-pay

## 2023-12-17 DIAGNOSIS — Z794 Long term (current) use of insulin: Secondary | ICD-10-CM | POA: Diagnosis not present

## 2023-12-17 DIAGNOSIS — E1169 Type 2 diabetes mellitus with other specified complication: Secondary | ICD-10-CM

## 2023-12-17 DIAGNOSIS — N1832 Chronic kidney disease, stage 3b: Secondary | ICD-10-CM

## 2023-12-17 DIAGNOSIS — Z6838 Body mass index (BMI) 38.0-38.9, adult: Secondary | ICD-10-CM

## 2023-12-17 DIAGNOSIS — I120 Hypertensive chronic kidney disease with stage 5 chronic kidney disease or end stage renal disease: Secondary | ICD-10-CM

## 2023-12-17 DIAGNOSIS — J4550 Severe persistent asthma, uncomplicated: Secondary | ICD-10-CM

## 2023-12-17 DIAGNOSIS — I480 Paroxysmal atrial fibrillation: Secondary | ICD-10-CM

## 2023-12-17 DIAGNOSIS — Z7984 Long term (current) use of oral hypoglycemic drugs: Secondary | ICD-10-CM

## 2023-12-17 DIAGNOSIS — E1159 Type 2 diabetes mellitus with other circulatory complications: Secondary | ICD-10-CM

## 2023-12-17 DIAGNOSIS — E119 Type 2 diabetes mellitus without complications: Secondary | ICD-10-CM | POA: Diagnosis not present

## 2023-12-17 DIAGNOSIS — I5032 Chronic diastolic (congestive) heart failure: Secondary | ICD-10-CM | POA: Diagnosis not present

## 2023-12-17 LAB — POCT GLYCOSYLATED HEMOGLOBIN (HGB A1C): HbA1c, POC (controlled diabetic range): 9.1 % — AB (ref 0.0–7.0)

## 2023-12-17 LAB — GLUCOSE, POCT (MANUAL RESULT ENTRY): POC Glucose: 143 mg/dL — AB (ref 70–99)

## 2023-12-17 MED ORDER — FAMOTIDINE 20 MG PO TABS
20.0000 mg | ORAL_TABLET | Freq: Every day | ORAL | 1 refills | Status: DC
  Filled 2023-12-17 – 2023-12-22 (×2): qty 90, 90d supply, fill #0
  Filled 2023-12-22: qty 30, 30d supply, fill #0
  Filled 2024-01-18: qty 30, 30d supply, fill #1

## 2023-12-17 NOTE — Progress Notes (Signed)
 Patient ID: Heidi [name], female    DOB: [birth year]  MRN: [id number]  CC: Diabetes (DM f/u.)   Subjective: Heidi [name] is a 73 y.o. female who presents for chronic ds management. Her concerns today include:  history of HTN, DM 2 with retinopathy, HL, CKD 3, diastolic CHF, a. Fib (02/2022) hypoxic respiratory failure/cough variant asthma vs moderate persistent asthma 2 L O2 continuously, migraines [facility] on Botox  inj), OSA (declines CPAP or repeat sleep study), UACS, GERD, OA knees,  DDD/spondylosis of cervical spine,obesity, renal cyst, LT thyroid  nodule.   Discussed the use of AI scribe software for clinical note transcription with the patient, who gave verbal consent to proceed.  History of Present Illness   Heidi [name] is a 73 year old female with diabetes, hypertension, and congestive heart failure who presents for a follow-up visit.  DM:  Results for orders placed or performed in visit on 12/17/23  POCT glucose (manual entry)   Collection Time: 12/17/23  2:32 PM  Result Value Ref Range   POC Glucose 143 (A) 70 - 99 mg/dl  POCT glycosylated hemoglobin (Hb A1C)   Collection Time: 12/17/23  2:36 PM  Result Value Ref Range   Hemoglobin A1C     HbA1c POC (<> result, manual entry)     HbA1c, POC (prediabetic range)     HbA1c, POC (controlled diabetic range) 9.1 (A) 0.0 - 7.0 %   *Note: Due to a large number of results and/or encounters for the requested time period, some results have not been displayed. A complete set of results can be found in Results Review.  Her A1c has improved from 9.7% in February to 9.1% currently. She inconsistently takes Jardiance  10 mg daily due to concerns about hypoglycemia. She follows a new diet plan since March, which she believes is beneficial.  This includes staying away from sweets and sodas, eating more fruits and vegetables and sometimes she can go a week without eating meat.  She checks her blood sugars 3 times a day  before meals and have log book with her with readings for the past 13 days.  Blood sugar readings are 90-118 in the morning, 88-100 before lunch with a one-time low of 77, and 123-165 before dinner. She consistently takes Humalog  75/25 insulin , 55 units in the morning and 45 units in the evening.  HTN/CHF/CKD: she takes furosemide , diltiazem  120 mg daily, atenolol  25 mg daily, Cozaar  50 mg daily, and a potassium supplement. She experiences some swelling in her feet, mainly when she is up on her feet a lot which she has been lately since her daughter has been in the hospital.  Weight has stayed stable.  There is no increased shortness of breath, orthopnea, or paroxysmal nocturnal dyspnea. Last 2 GFR's were 55 and 49.   She limits salt intake in her diet, although she used a small amount of salt recently while at the hospital with her daughter.   PAF: She continues Eliquis  for atrial fibrillation without any bruising or bleeding.   Asthma: Her asthma remains stable with no recent flares. She uses her Symbicort  inhaler and oxygen  at night, with portable oxygen  available if needed. She practices deep breathing exercises to maintain oxygen  levels.      Patient Active Problem List   Diagnosis Date Noted   Severe persistent chronic asthma without complication 09/21/2022   Stage 3b chronic kidney disease (HCC) 09/21/2022   Chronic diastolic CHF (congestive heart failure) (HCC) 03/18/2022   PAF (  paroxysmal atrial fibrillation) (HCC)    Unstable angina (HCC)    Chronic respiratory failure with hypoxia (HCC) 03/03/2022   Moderate persistent asthma 03/03/2022   Migraines 03/03/2022   Moderate persistent asthma without complication 09/04/2021   History of colonic polyps    Benign neoplasm of ascending colon    Benign neoplasm of transverse colon    Benign neoplasm of descending colon    Postural dizziness with presyncope 02/10/2021   Acute encephalopathy 01/27/2021   Fall at home, initial encounter  01/27/2021   Pneumonia due to COVID-19 virus 08/10/2020   Encephalopathy due to COVID-19 virus 08/10/2020   Acute kidney injury superimposed on CKD (HCC) 08/10/2020   COVID 08/10/2020   Encounter for medication review and counseling 03/18/2020   Medication management 02/28/2020   Exertional dyspnea 01/03/2020   Breast nodule 11/24/2019   Chronic respiratory failure with hypoxia, on home oxygen  therapy (HCC) 11/13/2019   Chest pain 08/24/2019   Diabetic retinopathy of right eye associated with type 2 diabetes mellitus (HCC) 08/16/2018   Upper airway cough syndrome 05/03/2018   Morbid obesity due to excess calories (HCC) complicated by hbp/ dm / hyperlipidemia 09/10/2017   Cough variant asthma vs UACS/vcd on ACEi  09/09/2017   Anxiety and depression 09/06/2017   Mild persistent asthma without complication 09/06/2017   Torn left ear lobe 11/20/2016   Heart failure with preserved ejection fraction (HCC) 06/27/2016   Post-menopausal atrophic vaginitis 11/14/2015   Primary osteoarthritis of both knees 07/18/2015   GERD (gastroesophageal reflux disease) 05/08/2015   Renal cyst 05/08/2015   Adenomatous polyp of colon 03/26/2015   Essential hypertension 02/12/2015   Cognitive deficit due to old head trauma 02/12/2015   Chronic left shoulder pain 02/11/2015   Thyroid  nodule 12/18/2014   Chronic neck pain    HLD (hyperlipidemia)    OSA treated with BiPAP 11/27/2014   Obesity hypoventilation syndrome (HCC) 11/27/2014   Demand ischemia (HCC)    Uncontrolled type 2 diabetes mellitus with hyperglycemia (HCC)    Cardiomegaly 11/25/2014   DM type 2 causing CKD stage 2 (HCC) 11/25/2014     Current Outpatient Medications on File Prior to Visit  Medication Sig Dispense Refill   Accu-Chek Softclix Lancets lancets USE AS DIRECTED 400 each 3   acetaminophen  (TYLENOL ) 500 MG tablet Take 500-1,000 mg by mouth every 6 (six) hours as needed (pain).     albuterol  (PROVENTIL ) (2.5 MG/3ML) 0.083%  nebulizer solution Take 3 mLs (2.5 mg total) by nebulization every 6 (six) hours as needed for wheezing or shortness of breath. 75 mL 5   albuterol  (VENTOLIN  HFA) 108 (90 Base) MCG/ACT inhaler Inhale 1-2 puffs into the lungs every 4 (four) hours as needed for wheezing or shortness of breath. 18 g 5   Alcohol  Swabs  (DROPSAFE ALCOHOL  PREP) 70 % PADS USE AS DIRECTED 300 each 3   apixaban  (ELIQUIS ) 5 MG TABS tablet Take 1 tablet (5 mg total) by mouth 2 (two) times daily. 180 tablet 1   atenolol  (TENORMIN ) 25 MG tablet Take 1 tablet (25 mg total) by mouth daily. 90 tablet 3   atorvastatin  (LIPITOR) 40 MG tablet TAKE 1 TABLET BY MOUTH EVERY DAY 90 tablet 2   Blood Glucose Calibration (ACCU-CHEK GUIDE CONTROL) LIQD L1-L2 Control Solution. UAD 1 each 1   Blood Glucose Monitoring Suppl (ACCU-CHEK GUIDE) w/Device KIT UAD 1 kit 0   budesonide -formoterol  (SYMBICORT ) 80-4.5 MCG/ACT inhaler Inhale 2 puffs into the lungs in the morning and at bedtime. 1 each 5  diltiazem  (CARDIZEM  CD) 120 MG 24 hr capsule Take 1 capsule (120 mg total) by mouth daily. 90 capsule 0   empagliflozin  (JARDIANCE ) 10 MG TABS tablet Take 1 tablet (10 mg total) by mouth daily. 90 tablet 1   furosemide  (LASIX ) 40 MG tablet Take 1 tablet (40 mg total) by mouth daily. 90 tablet 3   glucose blood (ACCU-CHEK GUIDE) test strip Test blood sugars 3-4 times a day with meals and at bedtime. E11.69 100 each 12   insulin  lispro protamine-lispro (HUMALOG  MIX 75/25) (75-25) 100 UNIT/ML SUSP injection Inject 0.55 mLs (55 Units total) into the skin every morning AND 0.45 mLs (45 Units total) every evening. 30 mL 6   Insulin  Syringe-Needle U-100 (INSULIN  SYRINGE 1CC/31GX5/16) 31G X 5/16 1 ML MISC Use to inject Humalog  75/25 mix twice daily 200 each 6   ipratropium (ATROVENT ) 0.03 % nasal spray Place 2 sprays into both nostrils every 12 (twelve) hours. 30 mL 0   loratadine  (CLARITIN ) 10 MG tablet Take 1 tablet (10 mg total) by mouth daily as needed for  allergies. 90 tablet 0   losartan  (COZAAR ) 50 MG tablet TAKE 1 TABLET EVERY DAY 90 tablet 3   montelukast  (SINGULAIR ) 10 MG tablet Take 1 tablet (10 mg total) by mouth at bedtime. 90 tablet 2   Multiple Vitamin (MULTIVITAMIN WITH MINERALS) TABS tablet Take 1 tablet by mouth in the morning. Centrum Silver     nitroGLYCERIN  (NITROSTAT ) 0.3 MG SL tablet DISSOLVE 1 TABLET UNDER THE  TONGUE EVERY 5 MINUTES AS NEEDED FOR CHEST PAIN. MAX OF 3 TABLETS IN 15 MINUTES. CALL 911 IF PAIN  PERSISTS. Strength: 0.3 mg 25 tablet 3   OXYGEN  Inhale 3 L into the lungs continuous.     potassium chloride  SA (KLOR-CON  M) 20 MEQ tablet TAKE 1 TABLET EVERY DAY 90 tablet 0   Pseudoeph-Doxylamine-DM-APAP (NYQUIL PO) Take by mouth as needed.     tiZANidine  (ZANAFLEX ) 2 MG tablet TAKE 1 TABLET BY MOUTH DAILY AS NEEDED FOR MUSCLE SPASM 30 tablet 1   topiramate  (TOPAMAX ) 25 MG tablet Take 1 tablet (25 mg total) by mouth 2 (two) times daily. Take with 50 mg tablets for total of 75 mg daily. 180 tablet 4   topiramate  (TOPAMAX ) 50 MG tablet Take 1 tablet (50 mg total) by mouth 2 (two) times daily. Take with 25mg  for total of 75mg  TWICE DAILY. 180 tablet 4   Ubrogepant  (UBRELVY ) 100 MG TABS Take 1 tablet onset migraine, may take another tablet if needed 2 hours later. (2 tab max/daily) 16 tablet 11   No current facility-administered medications on file prior to visit.    Allergies  Allergen Reactions   Januvia  [Sitagliptin ]     Chest pains   Tramadol  Other (See Comments)    hallucinations    Social History   Socioeconomic History   Marital status: Widowed    Spouse name: Not on file   Number of children: 4   Years of education: Not on file   Highest education level: Not on file  Occupational History   Occupation: Disabled  Tobacco Use   Smoking status: Never    Passive exposure: Never   Smokeless tobacco: Never  Vaping Use   Vaping status: Never Used  Substance and Sexual Activity   Alcohol  use: No   Drug  use: No   Sexual activity: Yes  Other Topics Concern   Not on file  Social History Narrative   Lives with daughter   Caffeine use:  1 cup coffee per day   Social Drivers of Health   Financial Resource Strain: Medium Risk (08/19/2023)   Overall Financial Resource Strain (CARDIA)    Difficulty of Paying Living Expenses: Somewhat hard  Food Insecurity: Food Insecurity Present (08/19/2023)   Hunger Vital Sign    Worried About Running Out of Food in the Last Year: Sometimes true    Ran Out of Food in the Last Year: Sometimes true  Transportation Needs: No Transportation Needs (08/19/2023)   PRAPARE - Administrator, Civil Service (Medical): No    Lack of Transportation (Non-Medical): No  Physical Activity: Inactive (08/19/2023)   Exercise Vital Sign    Days of Exercise per Week: 0 days    Minutes of Exercise per Session: 0 min  Stress: No Stress Concern Present (08/19/2023)   [employer] of Occupational Health - Occupational Stress Questionnaire    Feeling of Stress : Only a little  Social Connections: Moderately Integrated (08/19/2023)   Social Connection and Isolation Panel    Frequency of Communication with Friends and Family: More than three times a week    Frequency of Social Gatherings with Friends and Family: Once a week    Attends Religious Services: More than 4 times per year    Active Member of [employer] or Organizations: Yes    Attends Banker Meetings: More than 4 times per year    Marital Status: Divorced  Intimate Partner Violence: Not At Risk (08/19/2023)   Humiliation, Afraid, Rape, and Kick questionnaire    Fear of Current or Ex-Partner: No    Emotionally Abused: No    Physically Abused: No    Sexually Abused: No    Family History  Problem Relation Age of Onset   Colon cancer Father    Diabetes type II Sister    Colon cancer Sister    Diabetes type II Brother    Migraines Neg Hx    Breast cancer Neg Hx     Past Surgical History:   Procedure Laterality Date   ABDOMINAL HYSTERECTOMY     CATARACT EXTRACTION Left    CATARACT EXTRACTION     LT 01/2017, 02/2017 on RT   CESAREAN SECTION     COLONOSCOPY WITH PROPOFOL  N/A 04/15/2021   Procedure: COLONOSCOPY WITH PROPOFOL ;  Surgeon: Tobin Forts, MD;  Location: [facility];  Service: Endoscopy;  Laterality: N/A;  patient is on O2   JOINT REPLACEMENT Left    Plate in Ankle   LEFT HEART CATH AND CORONARY ANGIOGRAPHY N/A 03/04/2022   Procedure: LEFT HEART CATH AND CORONARY ANGIOGRAPHY;  Surgeon: Cody Das, MD;  Location: [facility];  Service: Cardiovascular;  Laterality: N/A;   POLYPECTOMY  04/15/2021   Procedure: POLYPECTOMY;  Surgeon: Tobin Forts, MD;  Location: [facility] ENDOS[facility];  Service: Endoscopy;;    ROS: Review of Systems Negative except as stated above  PHYSICAL EXAM: BP 121/76   Pulse 70   Ht 5' 4 (1.626 m)   Wt 226 lb 9.6 oz (102.8 kg)   SpO2 90%   BMI 38.90 kg/m   Wt Readings from Last 3 Encounters:  12/17/23 226 lb 9.6 oz (102.8 kg)  11/25/23 226 lb (102.5 kg)  10/28/23 227 lb (103 kg)    Physical Exam  General appearance - alert, well appearing, and in no distress Mental status -patient answers questions appropriately. Eyes - pupils equal and reactive, extraocular eye movements intact Chest - clear to auscultation, no wheezes, rales  or rhonchi, symmetric air entry.  Does not have O2 with her. Pox from today is on RA Heart - normal rate, regular rhythm, normal S1, S2, no murmurs, rubs, clicks or gallops Extremities -positive pedal edema Diabetic Foot Exam - Simple   Simple Foot Form Diabetic Foot exam was performed with the following findings: Yes 12/17/2023  9:34 PM  Visual Inspection No deformities, no ulcerations, no other skin breakdown bilaterally: Yes Sensation Testing Intact to touch and monofilament testing bilaterally: Yes Pulse Check Posterior Tibialis and Dorsalis pulse intact bilaterally: Yes Comments          Latest Ref Rng & Units 09/28/2023   10:02 PM 08/19/2023   12:30 PM 01/21/2023    3:31 PM  CMP  Glucose 70 - 99 mg/dL 829  562  130   BUN 8 - 23 mg/dL 19  11  14    Creatinine 0.44 - 1.00 mg/dL 8.65  7.84  6.96   Sodium 135 - 145 mmol/L 137  143  145   Potassium 3.5 - 5.1 mmol/L 3.8  4.4  4.0   Chloride 98 - 111 mmol/L 101  105  103   CO2 22 - 32 mmol/L 24  24  27    Calcium  8.9 - 10.3 mg/dL 9.4  9.6  9.4   Total Protein 6.0 - 8.5 g/dL  6.4    Total Bilirubin 0.0 - 1.2 mg/dL  0.2    Alkaline Phos 44 - 121 IU/L  154    AST 0 - 40 IU/L  17    ALT 0 - 32 IU/L  21     Lipid Panel     Component Value Date/Time   CHOL 117 08/19/2023 1230   TRIG 165 (H) 08/19/2023 1230   HDL 50 08/19/2023 1230   CHOLHDL 2.3 08/19/2023 1230   CHOLHDL 2.6 01/28/2021 0523   VLDL 30 01/28/2021 0523   LDLCALC 40 08/19/2023 1230    CBC    Component Value Date/Time   WBC 10.1 09/28/2023 2202   RBC 4.61 09/28/2023 2202   HGB 12.9 09/28/2023 2202   HGB 13.1 09/27/2023 1635   HCT 41.1 09/28/2023 2202   HCT 40.2 09/27/2023 1635   PLT 215 09/28/2023 2202   PLT 230 09/27/2023 1635   MCV 89.2 09/28/2023 2202   MCV 87 09/27/2023 1635   MCH 28.0 09/28/2023 2202   MCHC 31.4 09/28/2023 2202   RDW 14.3 09/28/2023 2202   RDW 13.9 09/27/2023 1635   LYMPHSABS 3.7 (H) 09/27/2023 1635   MONOABS 0.3 03/03/2022 0551   EOSABS 0.3 09/27/2023 1635   BASOSABS 0.1 09/27/2023 1635    ASSESSMENT AND PLAN: 1. Type 2 diabetes mellitus with morbid obesity (HCC) (Primary) Not at goal slightly improved over the past 4 months despite reported change in eating habits.  She has not been consistent with taking Jardiance  for fear that it may cause blood sugars to drop below.  Encouraged her to take the Jardiance  daily as prescribed.  Continue Lantus  30 units daily. - POCT glycosylated hemoglobin (Hb A1C) - POCT glucose (manual entry)  2. Insulin  long-term use (HCC) 3. Diabetes mellitus treated with oral medication (HCC)  4.  Stage 3b chronic kidney disease (HCC) Not on NSAID GFR can fluctuated depending on CHF status/fluid status  5. Chronic diastolic heart failure (HCC) Continue furosemide  and Jardiance   Low-salt diet encouraged  6. Paroxysmal atrial fibrillation (HCC) Stable on Eliquis .  Hemoglobin stable around 13.  Continue Cardizem  and atenolol   7. Hypertension associated  with type 2 diabetes mellitus (HCC) At goal.  Diltiazem  120 mg daily, atenolol  25 mg daily, Cozaar  50 mg daily potassium supplement  8. Severe persistent chronic asthma without complication Doing okay with no recent flares.  Continue Symbicort    Patient was given the opportunity to ask questions.  Patient verbalized understanding of the plan and was able to repeat key elements of the plan.   This documentation was completed using Paediatric nurse.  Any transcriptional errors are unintentional.  Orders Placed This Encounter  Procedures   POCT glycosylated hemoglobin (Hb A1C)   POCT glucose (manual entry)     Requested Prescriptions   Signed Prescriptions Disp Refills   famotidine  (PEPCID ) 20 MG tablet 90 tablet 1    Sig: Take 1 tablet (20 mg total) by mouth daily.    Return in about 4 months (around 04/17/2024) for 4 weeks with clinical pharmacist for DM.  Concetta Dee, MD, FACP

## 2023-12-17 NOTE — Patient Instructions (Signed)
 VISIT SUMMARY:  During your follow-up visit, we reviewed your diabetes, hypertension, congestive heart failure, atrial fibrillation, chronic kidney disease, and asthma. We discussed your current medications, recent improvements, and areas needing attention. We also addressed your general health maintenance and provided necessary prescriptions.  YOUR PLAN:  -TYPE 2 DIABETES MELLITUS: Your A1c has improved to 9.1% but is still above the target. This may be due to inconsistent use of Jardiance . Please take Jardiance  10 mg daily as prescribed. Continue with your current insulin  regimen and diet plan. We discussed a referral to an endocrinologist, but you declined due to financial concerns.  -CONGESTIVE HEART FAILURE: Your condition is stable with no increased shortness of breath. The swelling in your feet is likely due to increased activity. Continue your current medications and consider wearing compression socks if you are on your feet frequently. Please limit your sodium intake.  -ATRIAL FIBRILLATION: You are managing well with Eliquis  and have no signs of bruising or bleeding. Continue taking Eliquis  as prescribed and monitor for any signs of bleeding or bruising.  -CHRONIC KIDNEY DISEASE: Your kidney function is being monitored regularly. No changes to your current management plan are needed at this time.  -ASTHMA: Your asthma is stable with no recent flare-ups. Continue using your Symbicort  inhaler and nighttime oxygen . Keep practicing deep breathing exercises to maintain your oxygen  levels.  -GENERAL HEALTH MAINTENANCE: You received your first shingles vaccine and are due for the second shot next month. Please get the second shingles vaccine after July 7th.  INSTRUCTIONS:  Please take Jardiance  10 mg daily as prescribed and continue with your current insulin  regimen and diet plan. Consider wearing compression socks if you are on your feet frequently and limit your sodium intake. Continue taking  Eliquis  as prescribed and monitor for any signs of bleeding or bruising. Keep using your Symbicort  inhaler and nighttime oxygen , and practice deep breathing exercises. Get your second shingles vaccine after July 7th. A refill for famotidine  has been sent to your pharmacy.

## 2023-12-19 ENCOUNTER — Other Ambulatory Visit: Payer: Self-pay | Admitting: Internal Medicine

## 2023-12-20 ENCOUNTER — Other Ambulatory Visit: Payer: Self-pay

## 2023-12-21 ENCOUNTER — Other Ambulatory Visit: Payer: Self-pay

## 2023-12-22 ENCOUNTER — Other Ambulatory Visit: Payer: Self-pay

## 2023-12-30 ENCOUNTER — Telehealth (INDEPENDENT_AMBULATORY_CARE_PROVIDER_SITE_OTHER): Payer: Self-pay | Admitting: Otolaryngology

## 2023-12-30 ENCOUNTER — Institutional Professional Consult (permissible substitution) (INDEPENDENT_AMBULATORY_CARE_PROVIDER_SITE_OTHER): Admitting: Otolaryngology

## 2023-12-30 NOTE — Telephone Encounter (Signed)
 12/30/23 Patient called to reschedule appt, left message. Returned call, no answer, left message to return call to reschedule consult.

## 2024-01-05 ENCOUNTER — Ambulatory Visit: Admitting: Internal Medicine

## 2024-01-18 ENCOUNTER — Other Ambulatory Visit: Payer: Self-pay

## 2024-01-18 ENCOUNTER — Other Ambulatory Visit (HOSPITAL_COMMUNITY): Payer: Self-pay

## 2024-01-18 ENCOUNTER — Ambulatory Visit: Admitting: Pharmacist

## 2024-01-18 ENCOUNTER — Telehealth: Payer: Self-pay | Admitting: Internal Medicine

## 2024-01-18 MED ORDER — ZOSTER VAC RECOMB ADJUVANTED 50 MCG/0.5ML IM SUSR
0.5000 mL | Freq: Once | INTRAMUSCULAR | 0 refills | Status: AC
  Filled 2024-01-18: qty 1, 1d supply, fill #0

## 2024-01-18 NOTE — Telephone Encounter (Signed)
 The patient has been scheduled for the next available appointment and has confirmed and acknowledged of the appointment.

## 2024-01-18 NOTE — Telephone Encounter (Signed)
 Copied from CRM [id number]. Topic: Appointments - Scheduling Inquiry for Clinic >> Jan 18, 2024  1:29 PM Tobias L wrote:  Reason for CRM: Patient states she is unable to make today's appointment with [facility] and needing to reschedule appointment.   Please reach out to patient to reschedule, [phone]

## 2024-01-19 ENCOUNTER — Other Ambulatory Visit: Payer: Self-pay

## 2024-01-24 ENCOUNTER — Other Ambulatory Visit (HOSPITAL_COMMUNITY): Payer: Self-pay

## 2024-01-25 ENCOUNTER — Encounter: Payer: Self-pay | Admitting: Internal Medicine

## 2024-01-25 DIAGNOSIS — E103511 Type 1 diabetes mellitus with proliferative diabetic retinopathy with macular edema, right eye: Secondary | ICD-10-CM | POA: Diagnosis not present

## 2024-01-25 DIAGNOSIS — E103492 Type 1 diabetes mellitus with severe nonproliferative diabetic retinopathy without macular edema, left eye: Secondary | ICD-10-CM | POA: Diagnosis not present

## 2024-01-25 DIAGNOSIS — H04123 Dry eye syndrome of bilateral lacrimal glands: Secondary | ICD-10-CM | POA: Diagnosis not present

## 2024-01-25 DIAGNOSIS — H401112 Primary open-angle glaucoma, right eye, moderate stage: Secondary | ICD-10-CM | POA: Diagnosis not present

## 2024-01-25 DIAGNOSIS — H35371 Puckering of macula, right eye: Secondary | ICD-10-CM | POA: Diagnosis not present

## 2024-01-26 ENCOUNTER — Other Ambulatory Visit: Payer: Self-pay | Admitting: Cardiology

## 2024-01-26 ENCOUNTER — Other Ambulatory Visit: Payer: Self-pay | Admitting: Internal Medicine

## 2024-01-26 DIAGNOSIS — M545 Low back pain, unspecified: Secondary | ICD-10-CM

## 2024-01-27 DIAGNOSIS — E103511 Type 1 diabetes mellitus with proliferative diabetic retinopathy with macular edema, right eye: Secondary | ICD-10-CM | POA: Diagnosis not present

## 2024-01-27 DIAGNOSIS — H35371 Puckering of macula, right eye: Secondary | ICD-10-CM | POA: Diagnosis not present

## 2024-01-27 DIAGNOSIS — H1131 Conjunctival hemorrhage, right eye: Secondary | ICD-10-CM | POA: Diagnosis not present

## 2024-01-27 DIAGNOSIS — H401112 Primary open-angle glaucoma, right eye, moderate stage: Secondary | ICD-10-CM | POA: Diagnosis not present

## 2024-01-27 DIAGNOSIS — E103492 Type 1 diabetes mellitus with severe nonproliferative diabetic retinopathy without macular edema, left eye: Secondary | ICD-10-CM | POA: Diagnosis not present

## 2024-01-27 DIAGNOSIS — H04123 Dry eye syndrome of bilateral lacrimal glands: Secondary | ICD-10-CM | POA: Diagnosis not present

## 2024-01-31 ENCOUNTER — Other Ambulatory Visit: Payer: Self-pay

## 2024-02-02 ENCOUNTER — Other Ambulatory Visit: Payer: Self-pay

## 2024-02-03 ENCOUNTER — Telehealth: Payer: Self-pay | Admitting: Internal Medicine

## 2024-02-03 NOTE — Telephone Encounter (Signed)
 Called to confirm appt unable to LVM

## 2024-02-04 ENCOUNTER — Ambulatory Visit: Attending: Pharmacist | Admitting: Pharmacist

## 2024-02-09 ENCOUNTER — Other Ambulatory Visit: Payer: Self-pay

## 2024-02-10 ENCOUNTER — Other Ambulatory Visit: Payer: Self-pay

## 2024-02-11 ENCOUNTER — Other Ambulatory Visit: Payer: Self-pay

## 2024-02-14 ENCOUNTER — Other Ambulatory Visit (HOSPITAL_COMMUNITY): Payer: Self-pay

## 2024-02-14 ENCOUNTER — Other Ambulatory Visit: Payer: Self-pay | Admitting: Internal Medicine

## 2024-02-17 DIAGNOSIS — E103492 Type 1 diabetes mellitus with severe nonproliferative diabetic retinopathy without macular edema, left eye: Secondary | ICD-10-CM | POA: Diagnosis not present

## 2024-02-17 DIAGNOSIS — E103511 Type 1 diabetes mellitus with proliferative diabetic retinopathy with macular edema, right eye: Secondary | ICD-10-CM | POA: Diagnosis not present

## 2024-02-29 ENCOUNTER — Other Ambulatory Visit: Payer: Self-pay | Admitting: Internal Medicine

## 2024-02-29 ENCOUNTER — Other Ambulatory Visit: Payer: Self-pay

## 2024-03-02 ENCOUNTER — Other Ambulatory Visit: Payer: Self-pay

## 2024-03-02 MED ORDER — HUMALOG MIX 75/25 (75-25) 100 UNIT/ML ~~LOC~~ SUSP
SUBCUTANEOUS | 6 refills | Status: DC
  Filled 2024-03-02 – 2024-03-07 (×2): qty 30, 30d supply, fill #0
  Filled 2024-04-06: qty 10, 10d supply, fill #1
  Filled 2024-04-07: qty 20, 20d supply, fill #1
  Filled 2024-05-01: qty 30, 30d supply, fill #2
  Filled 2024-05-29: qty 30, 30d supply, fill #3
  Filled 2024-06-20 – 2024-06-21 (×2): qty 30, 30d supply, fill #4

## 2024-03-02 NOTE — Telephone Encounter (Signed)
 Requested medication (s) are due for refill today: yes  Requested medication (s) are on the active medication list: yes  Last refill:  08/19/23 30 ml 6 RF  Future visit scheduled: yes  Notes to clinic:  abnormal lab results   Requested Prescriptions  Pending Prescriptions Disp Refills   insulin  lispro protamine-lispro (HUMALOG  MIX 75/25) (75-25) 100 UNIT/ML SUSP injection 30 mL 6    Sig: Inject 0.55 mLs (55 Units total) into the skin every morning AND 0.45 mLs (45 Units total) every evening.     Endocrinology:  Diabetes - Insulins Failed - 03/02/2024 11:24 AM      Failed - HBA1C is between 0 and 7.9 and within 180 days    HbA1c, POC (controlled diabetic range)  Date Value Ref Range Status  12/17/2023 9.1 (A) 0.0 - 7.0 % Final         Passed - Valid encounter within last 6 months    Recent Outpatient Visits           2 months ago Type 2 diabetes mellitus with morbid obesity (HCC)   [facility] Of [place]. [facility] Vicci Barnie NOVAK, MD   5 months ago URI with cough and congestion   [facility] Dept Of [place]. [facility] [place], [place] W, NP   6 months ago Type 2 diabetes mellitus with morbid obesity [facility])   [facility] - [facility] Dept Of [place]. [facility] Vicci Barnie NOVAK, MD   1 year ago Type 2 diabetes mellitus with obesity [facility])   [facility] - A Dept Of [place]. [facility] Vicci Barnie B, MD   1 year ago Type 2 diabetes mellitus with morbid obesity [facility])   [facility] - A Dept Of . [facility] Vicci Barnie NOVAK, MD       Future Appointments             In 1 month Vicci Barnie NOVAK, MD [facility] - A Dept Of [place]. [facility] [place]

## 2024-03-07 ENCOUNTER — Other Ambulatory Visit: Payer: Self-pay

## 2024-03-08 ENCOUNTER — Other Ambulatory Visit: Payer: Self-pay

## 2024-03-13 ENCOUNTER — Telehealth: Payer: Self-pay

## 2024-03-13 NOTE — Telephone Encounter (Signed)
 Patient was identified as falling into the [place] Measure - Diabetes.   Patient was: Appointment scheduled with primary care provider in the next 30 days.

## 2024-03-14 DIAGNOSIS — H35371 Puckering of macula, right eye: Secondary | ICD-10-CM | POA: Diagnosis not present

## 2024-03-14 DIAGNOSIS — H04123 Dry eye syndrome of bilateral lacrimal glands: Secondary | ICD-10-CM | POA: Diagnosis not present

## 2024-03-14 DIAGNOSIS — H1131 Conjunctival hemorrhage, right eye: Secondary | ICD-10-CM | POA: Diagnosis not present

## 2024-03-14 DIAGNOSIS — E103411 Type 1 diabetes mellitus with severe nonproliferative diabetic retinopathy with macular edema, right eye: Secondary | ICD-10-CM | POA: Diagnosis not present

## 2024-03-14 DIAGNOSIS — E103511 Type 1 diabetes mellitus with proliferative diabetic retinopathy with macular edema, right eye: Secondary | ICD-10-CM | POA: Diagnosis not present

## 2024-03-14 DIAGNOSIS — E103492 Type 1 diabetes mellitus with severe nonproliferative diabetic retinopathy without macular edema, left eye: Secondary | ICD-10-CM | POA: Diagnosis not present

## 2024-03-14 DIAGNOSIS — H401112 Primary open-angle glaucoma, right eye, moderate stage: Secondary | ICD-10-CM | POA: Diagnosis not present

## 2024-03-14 LAB — HM DIABETES EYE EXAM

## 2024-03-15 ENCOUNTER — Other Ambulatory Visit: Payer: Self-pay

## 2024-03-15 ENCOUNTER — Emergency Department (HOSPITAL_COMMUNITY)

## 2024-03-15 ENCOUNTER — Encounter (HOSPITAL_COMMUNITY): Payer: Self-pay

## 2024-03-15 ENCOUNTER — Emergency Department (HOSPITAL_COMMUNITY)
Admission: EM | Admit: 2024-03-15 | Discharge: 2024-03-15 | Disposition: A | Discharge status: Home / Self Care | Source: Ambulatory Visit

## 2024-03-15 DIAGNOSIS — I509 Heart failure, unspecified: Secondary | ICD-10-CM | POA: Insufficient documentation

## 2024-03-15 DIAGNOSIS — R059 Cough, unspecified: Secondary | ICD-10-CM | POA: Diagnosis present

## 2024-03-15 DIAGNOSIS — R051 Acute cough: Secondary | ICD-10-CM | POA: Insufficient documentation

## 2024-03-15 DIAGNOSIS — E119 Type 2 diabetes mellitus without complications: Secondary | ICD-10-CM | POA: Diagnosis not present

## 2024-03-15 DIAGNOSIS — Z7901 Long term (current) use of anticoagulants: Secondary | ICD-10-CM | POA: Insufficient documentation

## 2024-03-15 DIAGNOSIS — J45909 Unspecified asthma, uncomplicated: Secondary | ICD-10-CM | POA: Diagnosis not present

## 2024-03-15 DIAGNOSIS — R0789 Other chest pain: Secondary | ICD-10-CM | POA: Diagnosis not present

## 2024-03-15 DIAGNOSIS — Z794 Long term (current) use of insulin: Secondary | ICD-10-CM | POA: Insufficient documentation

## 2024-03-15 DIAGNOSIS — Z7951 Long term (current) use of inhaled steroids: Secondary | ICD-10-CM | POA: Insufficient documentation

## 2024-03-15 DIAGNOSIS — R079 Chest pain, unspecified: Secondary | ICD-10-CM | POA: Diagnosis not present

## 2024-03-15 DIAGNOSIS — R519 Headache, unspecified: Secondary | ICD-10-CM | POA: Insufficient documentation

## 2024-03-15 DIAGNOSIS — Z79899 Other long term (current) drug therapy: Secondary | ICD-10-CM | POA: Insufficient documentation

## 2024-03-15 DIAGNOSIS — I110 Hypertensive heart disease with heart failure: Secondary | ICD-10-CM | POA: Diagnosis not present

## 2024-03-15 LAB — BASIC METABOLIC PANEL WITH GFR
Anion gap: 14 (ref 5–15)
BUN: 12 mg/dL (ref 8–23)
CO2: 25 mmol/L (ref 22–32)
Calcium: 9.7 mg/dL (ref 8.9–10.3)
Chloride: 99 mmol/L (ref 98–111)
Creatinine, Ser: 1.04 mg/dL — ABNORMAL HIGH (ref 0.44–1.00)
GFR, Estimated: 57 mL/min — ABNORMAL LOW (ref >60)
Glucose, Bld: 103 mg/dL — ABNORMAL HIGH (ref 70–99)
Potassium: 3.6 mmol/L (ref 3.5–5.1)
Sodium: 138 mmol/L (ref 135–145)

## 2024-03-15 LAB — CBG MONITORING, ED: Glucose-Capillary: 91 mg/dL (ref 70–99)

## 2024-03-15 LAB — TROPONIN I (HIGH SENSITIVITY)
Troponin I (High Sensitivity): 4 ng/L (ref <18)
Troponin I (High Sensitivity): 5 ng/L (ref <18)

## 2024-03-15 LAB — CBC
HCT: 43.5 % (ref 36.0–46.0)
Hemoglobin: 13.8 g/dL (ref 12.0–15.0)
MCH: 27.3 pg (ref 26.0–34.0)
MCHC: 31.7 g/dL (ref 30.0–36.0)
MCV: 86.1 fL (ref 80.0–100.0)
Platelets: 232 K/uL (ref 150–400)
RBC: 5.05 MIL/uL (ref 3.87–5.11)
RDW: 14.8 % (ref 11.5–15.5)
WBC: 7.2 K/uL (ref 4.0–10.5)
nRBC: 0 % (ref 0.0–0.2)

## 2024-03-15 LAB — RESP PANEL BY RT-PCR (RSV, FLU A&B, COVID)  RVPGX2
Influenza A by PCR: NEGATIVE
Influenza B by PCR: NEGATIVE
Resp Syncytial Virus by PCR: NEGATIVE
SARS Coronavirus 2 by RT PCR: NEGATIVE

## 2024-03-15 MED ORDER — ACETAMINOPHEN 325 MG PO TABS
650.0000 mg | ORAL_TABLET | Freq: Once | ORAL | Status: AC
  Administered 2024-03-15: 650 mg via ORAL
  Filled 2024-03-15: qty 2

## 2024-03-15 NOTE — ED Notes (Signed)
 Pt's vitals rechecked, Pt's o2 85 on RA. RN notified. Pt placed on 2L of 02. Pt did not report SOB

## 2024-03-15 NOTE — ED Notes (Signed)
 Rechecked pt's O2 sats and they are currently 95% on 2LNC. Pt states she uses O2 at night

## 2024-03-15 NOTE — Discharge Instructions (Signed)
 Labs, cardiac testing, covid/flu screen, and chest x-ray today was all normal. Can continue all your usual medications. Follow-up with your primary care doctor. Return to the ED for new or worsening symptoms.

## 2024-03-15 NOTE — ED Triage Notes (Signed)
 C/O [facility] since Sunday

## 2024-03-15 NOTE — ED Provider Notes (Signed)
  EMERGENCY DEPARTMENT AT [facility] Provider Note   CSN: [id number] Arrival date & time: 03/15/24  1247     Patient presents with: Chest Pain and Headache   Heidi [name] is a 73 y.o. female.   The history is provided by the patient and medical records.  Chest Pain Associated symptoms: headache   Headache  73 year old female with history of cardiomegaly, diabetes, GERD, obesity with hypoventilation syndrome on nightly O2, history of CHF, hypertension, asthma, presenting to the ED with chest pain.  States she noticed this on Monday when she was sitting in the car waiting for her daughter at her doctor's appointment.  States she dozed off in the car and woke up and felt like something had grabbed her heart or there was like some kind of demon pulling at her.  States she went got something to eat and it seemed to get better.  Does report she has had some URI symptoms since this time, notably cough and congestion.  She has been taking Mucinex  for this which seems to help a bit.  States she did have a COVID exposure recently so was concern for that.  Also reports some leg swelling.  This has been mild but she reports this is improved by time of my evaluation.  Prior to Admission medications   Medication Sig Start Date End Date Taking? Authorizing Provider  ACCU-CHEK GUIDE TEST test strip TEST BLOOD SUGAR THREE TO FOUR TIMES DAILY WITH MEALS AND AT BEDTIME 12/20/23   Vicci Barnie NOVAK, MD  Accu-Chek Softclix Lancets lancets TEST BLOOD SUGAR FOUR TIMES DAILY AS DIRECTED 12/20/23   Vicci Barnie NOVAK, MD  acetaminophen  (TYLENOL ) 500 MG tablet Take 500-1,000 mg by mouth every 6 (six) hours as needed (pain).    [provider]  albuterol  (PROVENTIL ) (2.5 MG/3ML) 0.083% nebulizer solution Take 3 mLs (2.5 mg total) by nebulization every 6 (six) hours as needed for wheezing or shortness of breath. 09/27/23   Theotis Haze ORN, NP  albuterol  (VENTOLIN  HFA) 108 (90 Base)  MCG/ACT inhaler Inhale 1-2 puffs into the lungs every 4 (four) hours as needed for wheezing or shortness of breath. 09/27/23   Theotis Haze ORN, NP  Alcohol  Swabs  (DROPSAFE ALCOHOL  PREP) 70 % PADS USE AS DIRECTED 08/09/23   Vicci Barnie NOVAK, MD  apixaban  (ELIQUIS ) 5 MG TABS tablet Take 1 tablet (5 mg total) by mouth 2 (two) times daily. 08/25/23   Vicci Barnie NOVAK, MD  atenolol  (TENORMIN ) 25 MG tablet Take 1 tablet (25 mg total) by mouth daily. 11/25/23   Loistine Barnie, NP  atorvastatin  (LIPITOR) 40 MG tablet TAKE 1 TABLET BY MOUTH EVERY DAY 05/04/23   Vicci Barnie NOVAK, MD  Blood Glucose Calibration (ACCU-CHEK GUIDE CONTROL) LIQD L1-L2 Control Solution. UAD 08/04/22   Vicci Barnie NOVAK, MD  Blood Glucose Monitoring Suppl (ACCU-CHEK GUIDE) w/Device KIT UAD 08/04/22   Vicci Barnie NOVAK, MD  budesonide -formoterol  (SYMBICORT ) 80-4.5 MCG/ACT inhaler Inhale 2 puffs into the lungs in the morning and at bedtime. 03/18/22   Cobb, Comer GAILS, NP  diltiazem  (CARDIZEM  CD) 120 MG 24 hr capsule TAKE 1 CAPSULE EVERY DAY 01/26/24   Patwardhan, Manish J, MD  empagliflozin  (JARDIANCE ) 10 MG TABS tablet Take 1 tablet (10 mg total) by mouth daily. 08/19/23   Vicci Barnie NOVAK, MD  famotidine  (PEPCID ) 20 MG tablet TAKE 1 TABLET (20 MG TOTAL) BY MOUTH DAILY. 02/15/24   Vicci Barnie NOVAK, MD  furosemide  (LASIX ) 40 MG tablet Take 1  tablet (40 mg total) by mouth daily. 11/25/23   Loistine Sober, NP  insulin  lispro protamine-lispro (HUMALOG  MIX 75/25) (75-25) 100 UNIT/ML SUSP injection Inject 0.55 mLs (55 Units total) into the skin every morning AND 0.45 mLs (45 Units total) every evening. 03/02/24   Vicci Sober NOVAK, MD  Insulin  Syringe-Needle U-100 (INSULIN  SYRINGE 1CC/31GX5/16) 31G X 5/16 1 ML MISC Use to inject Humalog  75/25 mix twice daily 07/26/23   Vicci Sober NOVAK, MD  ipratropium (ATROVENT ) 0.03 % nasal spray Place 2 sprays into both nostrils every 12 (twelve) hours. 09/27/23   Fleming, Zelda W, NP   loratadine  (CLARITIN ) 10 MG tablet Take 1 tablet (10 mg total) by mouth daily as needed for allergies. 08/19/23   Vicci Sober NOVAK, MD  losartan  (COZAAR ) 50 MG tablet TAKE 1 TABLET EVERY DAY 09/03/23   Vicci Sober NOVAK, MD  montelukast  (SINGULAIR ) 10 MG tablet Take 1 tablet (10 mg total) by mouth at bedtime. 01/27/23   Desai, Nikita S, MD  Multiple Vitamin (MULTIVITAMIN WITH MINERALS) TABS tablet Take 1 tablet by mouth in the morning. Centrum Silver    [provider]  nitroGLYCERIN  (NITROSTAT ) 0.3 MG SL tablet DISSOLVE 1 TABLET UNDER THE  TONGUE EVERY 5 MINUTES AS NEEDED FOR CHEST PAIN. MAX OF 3 TABLETS IN 15 MINUTES. CALL 911 IF PAIN  PERSISTS. Strength: 0.3 mg 11/25/23   Loistine Sober, NP  OXYGEN  Inhale 3 L into the lungs continuous.    [provider]  potassium chloride  SA (KLOR-CON  M) 20 MEQ tablet TAKE 1 TABLET EVERY DAY 12/25/22   Vicci Sober NOVAK, MD  Pseudoeph-Doxylamine-DM-APAP (NYQUIL PO) Take by mouth as needed.    [provider]  tiZANidine  (ZANAFLEX ) 2 MG tablet TAKE 1 TABLET BY MOUTH DAILY AS NEEDED FOR MUSCLE SPASM 01/27/24   Vicci Sober NOVAK, MD  topiramate  (TOPAMAX ) 25 MG tablet Take 1 tablet (25 mg total) by mouth 2 (two) times daily. Take with 50 mg tablets for total of 75 mg daily. 10/28/23   Ines Onetha NOVAK, MD  topiramate  (TOPAMAX ) 50 MG tablet Take 1 tablet (50 mg total) by mouth 2 (two) times daily. Take with 25mg  for total of 75mg  TWICE DAILY. 10/28/23   Ines Onetha NOVAK, MD  Ubrogepant  (UBRELVY ) 100 MG TABS Take 1 tablet onset migraine, may take another tablet if needed 2 hours later. (2 tab max/daily) 10/28/23   Ines Onetha NOVAK, MD    Allergies: Januvia  [sitagliptin ] and Tramadol     Review of Systems  Cardiovascular:  Positive for chest pain.  Neurological:  Positive for headaches.  All other systems reviewed and are negative.   Updated Vital Signs BP (!) 138/51 (BP Location: Left Arm)   Pulse 79   Temp 98.7 F (37.1 C)    Resp 18   Ht 5' 4 (1.626 m)   Wt 104.3 kg   SpO2 90%   BMI 39.48 kg/m   Physical Exam Vitals and nursing note reviewed.  Constitutional:      Appearance: She is well-developed.     Comments: Sleeping, awoken for exam  HENT:     Head: Normocephalic and atraumatic.  Eyes:     Conjunctiva/sclera: Conjunctivae normal.     Pupils: Pupils are equal, round, and reactive to light.  Cardiovascular:     Rate and Rhythm: Normal rate and regular rhythm.     Heart sounds: Normal heart sounds.  Pulmonary:     Effort: Pulmonary effort is normal.     Breath sounds: Normal  breath sounds.     Comments: 2L O2 in use (baseline when sleeping/night time), lungs CTAB, no distress Abdominal:     General: Bowel sounds are normal.     Palpations: Abdomen is soft.  Musculoskeletal:        General: Normal range of motion.     Cervical back: Normal range of motion.     Comments: No appreciable pitting edema of the lower extremities, grossly symmetric  Skin:    General: Skin is warm and dry.  Neurological:     Mental Status: She is alert and oriented to person, place, and time.     (all labs ordered are listed, but only abnormal results are displayed) Labs Reviewed  BASIC METABOLIC PANEL WITH GFR - Abnormal; Notable for the following components:      Result Value   Glucose, Bld 103 (*)    Creatinine, Ser 1.04 (*)    GFR, Estimated 57 (*)    All other components within normal limits  RESP PANEL BY RT-PCR (RSV, FLU A&B, COVID)  RVPGX2  CBC  CBG MONITORING, ED  TROPONIN I (HIGH SENSITIVITY)  TROPONIN I (HIGH SENSITIVITY)    EKG: EKG Interpretation Date/Time:  Wednesday March 15 2024 13:10:06 EDT Ventricular Rate:  85 PR Interval:  172 QRS Duration:  74 QT Interval:  352 QTC Calculation: 418 R Axis:   -25  Text Interpretation: Normal sinus rhythm Low voltage QRS Cannot rule out Anterior infarct , age undetermined Unchanged when compared to prior EKG from 5/22/205 Confirmed by  Gennaro Bouchard ([id number]) on 03/15/2024 10:18:29 PM  Radiology: DG Chest 2 View Result Date: 03/15/2024 CLINICAL DATA:  Chest pain. EXAM: CHEST - 2 VIEW COMPARISON:  Chest radiograph dated 09/27/2008. FINDINGS: Mild diffuse interstitial linear densities, likely chronic. Atypical pneumonia is less likely but not excluded. No focal consolidation, pleural effusion or pneumothorax. Stable cardiac silhouette. No acute osseous pathology. IMPRESSION: No focal consolidation. Electronically Signed   By: Vanetta Chou M.D.   On: 03/15/2024 14:24     Procedures   Medications Ordered in the ED - No data to display                                  Medical Decision Making Amount and/or Complexity of Data Reviewed Radiology: ordered and independent interpretation performed. ECG/medicine tests: ordered and independent interpretation performed.  Risk OTC drugs.   73 year old female presenting to the ED with chest pain.  Began on day, 2 days ago while falling asleep in the car.  Has also had some cough and chest congestion.  Also reports some lower extremity swelling but admittedly is improved by time of my evaluation.  Patient is sleeping comfortably on stretcher, awoken for exam.  Currently on 2 L which is baseline for her while sleeping or at nighttime.  Denies any current shortness of breath.  Lungs are clear without any wheezes or rhonchi.  She does not have any significant pitting edema on exam.  EKG is unchanged from prior.  Labs are reassuring without leukocytosis or electrolyte derangement.  Troponin negative x 2.  Chest x-ray is clear without any focal consolidation or vascular congestion.  COVID screening is also negative.    Symptoms are quite atypical.  I have low suspicion for ACS, PE, dissection, other acute cardiac event at this time.  She has been managing her URI symptoms with over-the-counter medications, think this is appropriate.  She was  encouraged to follow-up closely with her  primary care doctor.  Can return here for new concerns.  Final diagnoses:  Atypical chest pain  Acute cough    ED Discharge Orders     None          Jarold Olam HERO, PA-C 03/15/24 2327    Kammerer, Megan L, DO 03/16/24 1535

## 2024-03-15 NOTE — ED Triage Notes (Signed)
 PT arrives via POV. Pt reports she has been experiencing chest pain, headache, and swelling in her legs since yesterday. Pt arrives AxOx4.

## 2024-03-15 NOTE — ED Provider Triage Note (Signed)
 Emergency Medicine Provider Triage Evaluation Note  Heidi [name] , a 73 y.o. female  was evaluated in triage.  Pt complains of multiple complaints.  Patient reports that on Monday she was sitting in her car when she began to experience chest pain, like someone was grabbing her heart.  This happened once since but is not currently present.  She denies shortness of breath but does endorse worsening lower extremity edema.  She also endorses some congestion/cough.  Her right eye is noticeably red, she tells me that she receives injections for macular degeneration and was seen by her ophthalmologist yesterday for this.  Review of Systems  Positive: As above Negative: As above  Physical Exam  BP 137/81   Pulse 78   Temp (!) 97.4 F (36.3 C)   Resp 17   Ht 5' 4 (1.626 m)   Wt 104.3 kg   SpO2 93%   BMI 39.48 kg/m  Gen:   Awake, no distress   Resp:  Normal effort  MSK:   Moves extremities without difficulty  Other:  Conjunctival injection OD.  Medical Decision Making  Medically screening exam initiated at 1:44 PM.  Appropriate orders placed.  Heidi [name] was informed that the remainder of the evaluation will be completed by another provider, this initial triage assessment does not replace that evaluation, and the importance of remaining in the ED until their evaluation is complete.     Heidi [name] SAILOR, [place] 03/15/24 1346

## 2024-03-16 ENCOUNTER — Other Ambulatory Visit: Payer: Self-pay | Admitting: Internal Medicine

## 2024-03-17 NOTE — Telephone Encounter (Signed)
 Labs in date  Requested Prescriptions  Pending Prescriptions Disp Refills   potassium chloride  SA (KLOR-CON  M) 20 MEQ tablet [Pharmacy Med Name: POTASSIUM CHLORIDE  ER 20 MEQ Oral Tablet Extended Release] 90 tablet 3    Sig: TAKE 1 TABLET EVERY DAY     Endocrinology:  Minerals - Potassium Supplementation Failed - 03/17/2024  1:25 PM      Failed - Cr in normal range and within 360 days    Creat  Date Value Ref Range Status  05/12/2016 1.14 (H) 0.50 - 0.99 mg/dL Final    Comment:      For patients > or = 73 years of age: The upper reference limit for Creatinine is approximately 13% higher for people identified as African-American.      Creatinine, Ser  Date Value Ref Range Status  03/15/2024 1.04 (H) 0.44 - 1.00 mg/dL Final   Creatinine, Urine  Date Value Ref Range Status  11/27/2019 146.00 mg/dL Final    Comment:    Performed at [facility], [address] [place], [place] [address]         Passed - K in normal range and within 360 days    Potassium  Date Value Ref Range Status  03/15/2024 3.6 3.5 - 5.1 mmol/L Final         Passed - Valid encounter within last 12 months    Recent Outpatient Visits           3 months ago Type 2 diabetes mellitus with morbid obesity (HCC)   [facility] [place] Vicci Barnie NOVAK, MD   5 months ago URI with cough and congestion   [facility] [facility] [place]. [facility] [place], [place] W, NP   7 months ago Type 2 diabetes mellitus with morbid obesity [facility])   [place] [facility] [facility] Of [place]. [facility] Vicci Barnie NOVAK, MD   1 year ago Type 2 diabetes mellitus with obesity [facility])   [place] [facility] Of [place]. [facility] Vicci Barnie B, MD   1 year ago Type 2 diabetes mellitus with morbid obesity [facility])    [facility] [place]. [facility] Vicci Barnie NOVAK, MD       Future Appointments             In 1 month Vicci Barnie NOVAK, MD [facility] - [facility] Dept Of [place]. [facility] [place]

## 2024-03-20 DIAGNOSIS — H04123 Dry eye syndrome of bilateral lacrimal glands: Secondary | ICD-10-CM | POA: Diagnosis not present

## 2024-03-20 DIAGNOSIS — E113511 Type 2 diabetes mellitus with proliferative diabetic retinopathy with macular edema, right eye: Secondary | ICD-10-CM | POA: Diagnosis not present

## 2024-03-20 DIAGNOSIS — H401111 Primary open-angle glaucoma, right eye, mild stage: Secondary | ICD-10-CM | POA: Diagnosis not present

## 2024-03-20 DIAGNOSIS — Z961 Presence of intraocular lens: Secondary | ICD-10-CM | POA: Diagnosis not present

## 2024-03-20 DIAGNOSIS — E113592 Type 2 diabetes mellitus with proliferative diabetic retinopathy without macular edema, left eye: Secondary | ICD-10-CM | POA: Diagnosis not present

## 2024-03-24 ENCOUNTER — Other Ambulatory Visit: Payer: Self-pay

## 2024-03-27 DIAGNOSIS — E119 Type 2 diabetes mellitus without complications: Secondary | ICD-10-CM | POA: Diagnosis not present

## 2024-03-28 ENCOUNTER — Ambulatory Visit: Payer: Medicare HMO | Attending: Internal Medicine

## 2024-03-28 ENCOUNTER — Telehealth: Payer: Self-pay

## 2024-03-28 VITALS — Ht 64.0 in | Wt 226.0 lb

## 2024-03-28 DIAGNOSIS — Z0000 Encounter for general adult medical examination without abnormal findings: Secondary | ICD-10-CM

## 2024-03-28 NOTE — Patient Instructions (Signed)
 [name],  Thank you for taking the time for your Medicare Wellness Visit. I appreciate your continued commitment to your health goals. Please review the care plan we discussed, and feel free to reach out if I can assist you further.  Medicare recommends these wellness visits once per year to help you and your care team stay ahead of potential health issues. These visits are designed to focus on prevention, allowing your provider to concentrate on managing your acute and chronic conditions during your regular appointments.  Please note that Annual Wellness Visits do not include a physical exam. Some assessments may be limited, especially if the visit was conducted virtually. If needed, we may recommend a separate in-person follow-up with your provider.  Ongoing Care Seeing your primary care provider every 3 to 6 months helps us  monitor your health and provide consistent, personalized care.   Referrals If a referral was made during today's visit and you haven't received any updates within two weeks, please contact the referred provider directly to check on the status.  Recommended Screenings:  Health Maintenance  Topic Date Due   COVID-19 Vaccine (1) Never done   DEXA scan (bone density measurement)  Never done   Flu Shot  02/04/2024   Colon Cancer Screening  04/15/2024   Hemoglobin A1C  06/17/2024   Yearly kidney health urinalysis for diabetes  08/18/2024   Complete foot exam   12/16/2024   Breast Cancer Screening  01/12/2025   Eye exam for diabetics  03/14/2025   Yearly kidney function blood test for diabetes  03/15/2025   Medicare Annual Wellness Visit  03/28/2025   DTaP/Tdap/Td vaccine (2 - Td or Tdap) 11/21/2026   Pneumococcal Vaccine for age over 42  Completed   Hepatitis C Screening  Completed   Zoster (Shingles) Vaccine  Completed   HPV Vaccine  Aged Out   Meningitis B Vaccine  Aged Out       03/28/2024    1:56 PM  Advanced Directives  Does Patient Have a Medical  Advance Directive? No  Would patient like information on creating a medical advance directive? No - Patient declined   Advance Care Planning is important because it: Ensures you receive medical care that aligns with your values, goals, and preferences. Provides guidance to your family and loved ones, reducing the emotional burden of decision-making during critical moments.  Vision: Annual vision screenings are recommended for early detection of glaucoma, cataracts, and diabetic retinopathy. These exams can also reveal signs of chronic conditions such as diabetes and high blood pressure.  Dental: Annual dental screenings help detect early signs of oral cancer, gum disease, and other conditions linked to overall health, including heart disease and diabetes.  Please see the attached documents for additional preventive care recommendations.

## 2024-03-28 NOTE — Telephone Encounter (Signed)
 Copied from CRM #[id number]. Topic: Appointments - Scheduling Inquiry for Clinic >> Mar 28, 2024  1:43 PM Donna BRAVO wrote: Reason for CRM: patient is returning missed call, patient stated someone came into her home on 03/28/24 to do Annual wellness appt, drew blood, and then talked to someone on the phone,

## 2024-03-28 NOTE — Progress Notes (Signed)
 Because this visit was a virtual/telehealth visit,  certain criteria was not obtained, such a blood pressure, CBG if applicable, and timed get up and go. Any medications not marked as taking were not mentioned during the medication reconciliation part of the visit. Any vitals not documented were not able to be obtained due to this being a telehealth visit or patient was unable to self-report a recent blood pressure reading due to a lack of equipment at home via telehealth. Vitals that have been documented are verbally provided by the patient.   Subjective:   Heidi [name] is a 73 y.o. who presents for a Medicare Wellness preventive visit.  As a reminder, Annual Wellness Visits don't include a physical exam, and some assessments may be limited, especially if this visit is performed virtually. We may recommend an in-person follow-up visit with your provider if needed.  Visit Complete: Virtual I connected with  Heidi [name] on 03/28/24 by a audio enabled telemedicine application and verified that I am speaking with the correct person using two identifiers.  Patient Location: Home  Provider Location: Office/Clinic  I discussed the limitations of evaluation and management by telemedicine. The patient expressed understanding and agreed to proceed.  Vital Signs: Because this visit was a virtual/telehealth visit, some criteria may be missing or patient reported. Any vitals not documented were not able to be obtained and vitals that have been documented are patient reported.  VideoDeclined- This patient declined Librarian, academic. Therefore the visit was completed with audio only.  Persons Participating in Visit: Patient.  AWV Questionnaire: No: Patient Medicare AWV questionnaire was not completed prior to this visit.  Cardiac Risk Factors include: advanced age (>79men, >42 women);sedentary lifestyle;diabetes mellitus;dyslipidemia;hypertension;obesity (BMI  >30kg/m2)     Objective:    Today's Vitals   03/28/24 1403  Weight: 226 lb (102.5 kg)  Height: 5' 4 (1.626 m)  PainSc: 0-No pain   Body mass index is 38.79 kg/m.     03/28/2024    1:56 PM 09/28/2023   10:02 PM 03/23/2023    1:18 PM 09/01/2022    1:14 PM 03/02/2022    3:56 PM 02/11/2022   12:19 AM 05/11/2021    6:04 PM  Advanced Directives  Does Patient Have a Medical Advance Directive? No No No No No No No  Would patient like information on creating a medical advance directive? No - Patient declined No - Patient declined Yes (MAU/Ambulatory/Procedural Areas - Information given) No - Patient declined No - Patient declined No - Patient declined No - Patient declined    Current Medications (verified) Outpatient Encounter Medications as of 03/28/2024  Medication Sig   ACCU-CHEK GUIDE TEST test strip TEST BLOOD SUGAR THREE TO FOUR TIMES DAILY WITH MEALS AND AT BEDTIME   Accu-Chek Softclix Lancets lancets TEST BLOOD SUGAR FOUR TIMES DAILY AS DIRECTED   acetaminophen  (TYLENOL ) 500 MG tablet Take 500-1,000 mg by mouth every 6 (six) hours as needed (pain).   albuterol  (PROVENTIL ) (2.5 MG/3ML) 0.083% nebulizer solution Take 3 mLs (2.5 mg total) by nebulization every 6 (six) hours as needed for wheezing or shortness of breath.   albuterol  (VENTOLIN  HFA) 108 (90 Base) MCG/ACT inhaler Inhale 1-2 puffs into the lungs every 4 (four) hours as needed for wheezing or shortness of breath.   Alcohol  Swabs  (DROPSAFE ALCOHOL  PREP) 70 % PADS USE AS DIRECTED   apixaban  (ELIQUIS ) 5 MG TABS tablet Take 1 tablet (5 mg total) by mouth 2 (two) times daily.  atenolol  (TENORMIN ) 25 MG tablet Take 1 tablet (25 mg total) by mouth daily.   atorvastatin  (LIPITOR) 40 MG tablet TAKE 1 TABLET BY MOUTH EVERY DAY   Blood Glucose Calibration (ACCU-CHEK GUIDE CONTROL) LIQD L1-L2 Control Solution. UAD   Blood Glucose Monitoring Suppl (ACCU-CHEK GUIDE) w/Device KIT UAD   budesonide -formoterol  (SYMBICORT ) 80-4.5 MCG/ACT  inhaler Inhale 2 puffs into the lungs in the morning and at bedtime.   diltiazem  (CARDIZEM  CD) 120 MG 24 hr capsule TAKE 1 CAPSULE EVERY DAY   empagliflozin  (JARDIANCE ) 10 MG TABS tablet Take 1 tablet (10 mg total) by mouth daily.   famotidine  (PEPCID ) 20 MG tablet TAKE 1 TABLET (20 MG TOTAL) BY MOUTH DAILY.   furosemide  (LASIX ) 40 MG tablet Take 1 tablet (40 mg total) by mouth daily.   insulin  lispro protamine-lispro (HUMALOG  MIX 75/25) (75-25) 100 UNIT/ML SUSP injection Inject 0.55 mLs (55 Units total) into the skin every morning AND 0.45 mLs (45 Units total) every evening.   Insulin  Syringe-Needle U-100 (INSULIN  SYRINGE 1CC/31GX5/16) 31G X 5/16 1 ML MISC Use to inject Humalog  75/25 mix twice daily   ipratropium (ATROVENT ) 0.03 % nasal spray Place 2 sprays into both nostrils every 12 (twelve) hours.   loratadine  (CLARITIN ) 10 MG tablet Take 1 tablet (10 mg total) by mouth daily as needed for allergies.   losartan  (COZAAR ) 50 MG tablet TAKE 1 TABLET EVERY DAY   montelukast  (SINGULAIR ) 10 MG tablet Take 1 tablet (10 mg total) by mouth at bedtime.   Multiple Vitamin (MULTIVITAMIN WITH MINERALS) TABS tablet Take 1 tablet by mouth in the morning. Centrum Silver   nitroGLYCERIN  (NITROSTAT ) 0.3 MG SL tablet DISSOLVE 1 TABLET UNDER THE  TONGUE EVERY 5 MINUTES AS NEEDED FOR CHEST PAIN. MAX OF 3 TABLETS IN 15 MINUTES. CALL 911 IF PAIN  PERSISTS. Strength: 0.3 mg   OXYGEN  Inhale 3 L into the lungs continuous.   potassium chloride  SA (KLOR-CON  M) 20 MEQ tablet TAKE 1 TABLET EVERY DAY   Pseudoeph-Doxylamine-DM-APAP (NYQUIL PO) Take by mouth as needed.   tiZANidine  (ZANAFLEX ) 2 MG tablet TAKE 1 TABLET BY MOUTH DAILY AS NEEDED FOR MUSCLE SPASM   topiramate  (TOPAMAX ) 25 MG tablet Take 1 tablet (25 mg total) by mouth 2 (two) times daily. Take with 50 mg tablets for total of 75 mg daily.   topiramate  (TOPAMAX ) 50 MG tablet Take 1 tablet (50 mg total) by mouth 2 (two) times daily. Take with 25mg  for total of 75mg   TWICE DAILY.   Ubrogepant  (UBRELVY ) 100 MG TABS Take 1 tablet onset migraine, may take another tablet if needed 2 hours later. (2 tab max/daily)   No facility-administered encounter medications on file as of 03/28/2024.    Allergies (verified) Januvia  [sitagliptin ] and Tramadol    History: Past Medical History:  Diagnosis Date   Adenomatous colon polyp    Anemia    Anxiety    Asthma    CHF (congestive heart failure) (HCC)    COPD (chronic obstructive pulmonary disease) (HCC)    Diabetes mellitus without complication (HCC)    High cholesterol    Hypertension    Left knee injury    Ocular hypertension 08/12/2022   OD   Pneumonia    Sleep apnea    Supplemental oxygen  dependent    2L [place]   Past Surgical History:  Procedure Laterality Date   ABDOMINAL HYSTERECTOMY     CATARACT EXTRACTION Left    CATARACT EXTRACTION     LT 01/2017, 02/2017 on RT  CESAREAN SECTION     COLONOSCOPY WITH PROPOFOL  N/A 04/15/2021   Procedure: COLONOSCOPY WITH PROPOFOL ;  Surgeon: Abran Norleen SAILOR, MD;  Location: [facility];  Service: Endoscopy;  Laterality: N/A;  patient is on O2   JOINT REPLACEMENT Left    Plate in Ankle   LEFT HEART CATH AND CORONARY ANGIOGRAPHY N/A 03/04/2022   Procedure: LEFT HEART CATH AND CORONARY ANGIOGRAPHY;  Surgeon: Elmira Newman PARAS, MD;  Location: [facility];  Service: Cardiovascular;  Laterality: N/A;   POLYPECTOMY  04/15/2021   Procedure: POLYPECTOMY;  Surgeon: Abran Norleen SAILOR, MD;  Location: [facility];  Service: Endoscopy;;   Family History  Problem Relation Age of Onset   Colon cancer Father    Diabetes type II Sister    Colon cancer Sister    Diabetes type II Brother    Migraines Neg Hx    Breast cancer Neg Hx    Social History   Socioeconomic History   Marital status: Widowed    Spouse name: Not on file   Number of children: 4   Years of education: Not on file   Highest education level: Not on file  Occupational History   Occupation: Disabled   Tobacco Use   Smoking status: Never    Passive exposure: Never   Smokeless tobacco: Never  Vaping Use   Vaping status: Never Used  Substance and Sexual Activity   Alcohol  use: No   Drug use: No   Sexual activity: Yes  Other Topics Concern   Not on file  Social History Narrative   Lives with daughter   Caffeine use: 1 cup coffee per day   Social Drivers of Health   Financial Resource Strain: Low Risk  (03/28/2024)   Overall Financial Resource Strain (CARDIA)    Difficulty of Paying Living Expenses: Not hard at all  Food Insecurity: No Food Insecurity (03/28/2024)   Hunger Vital Sign    Worried About Running Out of Food in the Last Year: Never true    Ran Out of Food in the Last Year: Never true  Transportation Needs: No Transportation Needs (03/28/2024)   PRAPARE - Administrator, Civil Service (Medical): No    Lack of Transportation (Non-Medical): No  Physical Activity: Insufficiently Active (03/28/2024)   Exercise Vital Sign    Days of Exercise per Week: 6 days    Minutes of Exercise per Session: 10 min  Stress: No Stress Concern Present (03/28/2024)   [employer] of Occupational Health - Occupational Stress Questionnaire    Feeling of Stress: Not at all  Social Connections: Moderately Integrated (03/28/2024)   Social Connection and Isolation Panel    Frequency of Communication with Friends and Family: More than three times a week    Frequency of Social Gatherings with Friends and Family: Once a week    Attends Religious Services: More than 4 times per year    Active Member of [employer] or Organizations: Yes    Attends Engineer, structural: More than 4 times per year    Marital Status: Divorced    Tobacco Counseling Counseling given: Not Answered    Clinical Intake:  Pre-visit preparation completed: Yes  Pain : No/denies pain Pain Score: 0-No pain     BMI - recorded: 38.79 Nutritional Status: BMI > 30  Obese Nutritional Risks:  None Diabetes: Yes CBG done?: No Did pt. bring in CBG monitor from home?: No  Lab Results  Component Value Date   HGBA1C 9.1 (  A) 12/17/2023   HGBA1C 9.7 (A) 08/19/2023   HGBA1C 9.4 (H) 01/21/2023     How often do you need to have someone help you when you read instructions, pamphlets, or other written materials from your doctor or pharmacy?: 1 - Never  Interpreter Needed?: No  Information entered by :: Kieffer Blatz N. Paidyn Mcferran, LPN.   Activities of Daily Living     03/28/2024    2:05 PM  In your present state of health, do you have any difficulty performing the following activities:  Hearing? 0  Vision? 0  Difficulty concentrating or making decisions? 0  Walking or climbing stairs? 0  Dressing or bathing? 0  Doing errands, shopping? 0  Preparing Food and eating ? N  Using the Toilet? N  In the past six months, have you accidently leaked urine? N  Do you have problems with loss of bowel control? N  Managing your Medications? N  Managing your Finances? N  Housekeeping or managing your Housekeeping? N    Patient Care Team: Vicci Barnie NOVAK, MD as PCP - General (Internal Medicine) Elmira Newman PARAS, MD as PCP - Cardiology (Cardiology) Elner Arley LABOR, MD as Consulting Physician (Ophthalmology) Morgan Clayborne CROME, RN as [facility] Care Management  I have updated your Care Teams any recent Medical Services you may have received from other providers in the past year.     Assessment:   This is a routine wellness examination for Matricia.  Hearing/Vision screen Hearing Screening - Comments:: Denies hearing difficulties. No hearing aids.   Vision Screening - Comments:: Wears rx glasses - up to date with routine eye exams with Arley Elner, MD.    Goals Addressed               This Visit's Progress     03/28/2024 (pt-stated)        My goal for 2025-2026 is to get more healthier.  Keep going forward and not backwards.       Depression Screen      03/28/2024    2:06 PM 08/19/2023   11:31 AM 03/23/2023    1:07 PM 01/21/2023    2:42 PM 09/21/2022    4:07 PM 11/07/2021    9:41 AM 03/07/2021   11:54 AM  PHQ 2/9 Scores  PHQ - 2 Score 0 0 0  1 1 2   PHQ- 9 Score 0 0   1 3   Exception Documentation    Patient refusal       Fall Risk     03/28/2024    2:05 PM 09/27/2023    3:43 PM 03/23/2023    1:12 PM 01/21/2023    2:34 PM 09/21/2022    3:57 PM  Fall Risk   Falls in the past year? 1 0 0 0 0  Number falls in past yr: 0 0 0 0 0  Injury with Fall? 0 0 0 0 0  Risk for fall due to :  No Fall Risks No Fall Risks No Fall Risks History of fall(s)  Follow up Falls evaluation completed;Education provided Falls evaluation completed Falls prevention discussed;Education provided;Falls evaluation completed      MEDICARE RISK AT HOME:  Medicare Risk at Home Any stairs in or around the home?: No If so, are there any without handrails?: No Home free of loose throw rugs in walkways, pet beds, electrical cords, etc?: Yes Adequate lighting in your home to reduce risk of falls?: Yes Life alert?: No Use of a cane, walker  or w/c?: No Grab bars in the bathroom?: No Shower chair or bench in shower?: No Elevated toilet seat or a handicapped toilet?: No  TIMED UP AND GO:  Was the test performed?  No.  Cognitive Function: Declined/Normal: No cognitive concerns noted by patient or family. Patient alert, oriented, able to answer questions appropriately and recall recent events. No signs of memory loss or confusion.    03/28/2024    2:08 PM  MMSE - Mini Mental State Exam  Not completed: Unable to complete        03/28/2024    2:05 PM 03/23/2023    1:18 PM  6CIT Screen  What Year? 0 points 0 points  What month? 0 points 0 points  What time? 0 points 0 points  Count back from 20 0 points 0 points  Months in reverse 0 points 2 points  Repeat phrase 0 points 0 points  Total Score 0 points 2 points    Immunizations Immunization History  Administered  Date(s) Administered   Fluad Quad(high Dose 65+) 04/10/2022   Fluad Trivalent(High Dose 65+) 08/19/2023   INFLUENZA, HIGH DOSE SEASONAL PF 04/08/2018, 02/25/2019   Influenza Whole 05/11/2014   Influenza,inj,Quad PF,6+ Mos 03/26/2015, 05/12/2016, 04/18/2017   Pneumococcal Conjugate-13 01/14/2015   Pneumococcal Polysaccharide-23 12/19/2013, 11/13/2019   Tdap 11/20/2016   Zoster Recombinant(Shingrix ) 11/15/2023, 01/18/2024    Screening Tests Health Maintenance  Topic Date Due   COVID-19 Vaccine (1) Never done   DEXA SCAN  Never done   Influenza Vaccine  02/04/2024   Colonoscopy  04/15/2024   HEMOGLOBIN A1C  06/17/2024   Diabetic kidney evaluation - Urine ACR  08/18/2024   FOOT EXAM  12/16/2024   Mammogram  01/12/2025   OPHTHALMOLOGY EXAM  03/14/2025   Diabetic kidney evaluation - eGFR measurement  03/15/2025   Medicare Annual Wellness (AWV)  03/28/2025   DTaP/Tdap/Td (2 - Td or Tdap) 11/21/2026   Pneumococcal Vaccine: 50+ Years  Completed   Hepatitis C Screening  Completed   Zoster Vaccines- Shingrix   Completed   HPV VACCINES  Aged Out   Meningococcal B Vaccine  Aged Out    Health Maintenance Items Addressed: Patient is due for Colonoscopy and Bone Density Scan.  Additional Screening:  Vision Screening: Recommended annual ophthalmology exams for early detection of glaucoma and other disorders of the eye. Is the patient up to date with their annual eye exam?  Yes  Who is the provider or what is the name of the office in which the patient attends annual eye exams? Arley Ruder, MD.  Dental Screening: Recommended annual dental exams for proper oral hygiene  Community Resource Referral / Chronic Care Management: CRR required this visit?  No   CCM required this visit?  No   Plan:    I have personally reviewed and noted the following in the patient's chart:   Medical and social history Use of alcohol , tobacco or illicit drugs  Current medications and supplements  including opioid prescriptions. Patient is not currently taking opioid prescriptions. Functional ability and status Nutritional status Physical activity Advanced directives List of other physicians Hospitalizations, surgeries, and ER visits in previous 12 months Vitals Screenings to include cognitive, depression, and falls Referrals and appointments  In addition, I have reviewed and discussed with patient certain preventive protocols, quality metrics, and best practice recommendations. A written personalized care plan for preventive services as well as general preventive health recommendations were provided to patient.   Roz LOISE Fuller, [place]   [id number]  After Visit Summary: (Declined) Due to this being a telephonic visit, with patients personalized plan was offered to patient but patient Declined AVS at this time   Notes: Please refer to Routing Comments.

## 2024-03-29 ENCOUNTER — Other Ambulatory Visit: Payer: Self-pay

## 2024-04-06 ENCOUNTER — Other Ambulatory Visit: Payer: Self-pay

## 2024-04-06 ENCOUNTER — Other Ambulatory Visit (HOSPITAL_COMMUNITY): Payer: Self-pay

## 2024-04-06 ENCOUNTER — Other Ambulatory Visit: Payer: Self-pay | Admitting: Internal Medicine

## 2024-04-06 DIAGNOSIS — M545 Low back pain, unspecified: Secondary | ICD-10-CM

## 2024-04-07 ENCOUNTER — Other Ambulatory Visit (HOSPITAL_COMMUNITY): Payer: Self-pay

## 2024-04-07 ENCOUNTER — Other Ambulatory Visit: Payer: Self-pay

## 2024-04-10 ENCOUNTER — Other Ambulatory Visit: Payer: Self-pay

## 2024-04-10 ENCOUNTER — Other Ambulatory Visit: Payer: Self-pay | Admitting: Nurse Practitioner

## 2024-04-10 DIAGNOSIS — J069 Acute upper respiratory infection, unspecified: Secondary | ICD-10-CM

## 2024-04-10 MED ORDER — IPRATROPIUM BROMIDE 0.03 % NA SOLN: 2.0000 | Freq: Two times a day (BID) | NASAL | 0 refills | Status: DC

## 2024-04-11 ENCOUNTER — Other Ambulatory Visit: Payer: Self-pay

## 2024-04-12 ENCOUNTER — Other Ambulatory Visit: Payer: Self-pay

## 2024-04-13 ENCOUNTER — Other Ambulatory Visit: Payer: Self-pay | Admitting: Cardiology

## 2024-04-17 ENCOUNTER — Ambulatory Visit: Admitting: Internal Medicine

## 2024-04-19 ENCOUNTER — Other Ambulatory Visit: Payer: Self-pay

## 2024-04-26 ENCOUNTER — Other Ambulatory Visit: Payer: Self-pay | Admitting: Cardiology

## 2024-04-26 ENCOUNTER — Other Ambulatory Visit: Payer: Self-pay

## 2024-04-26 MED ORDER — APIXABAN 5 MG PO TABS
5.0000 mg | ORAL_TABLET | Freq: Two times a day (BID) | ORAL | 5 refills | Status: DC
  Filled 2024-04-26: qty 60, 30d supply, fill #0
  Filled 2024-05-22: qty 60, 30d supply, fill #1

## 2024-04-26 NOTE — Telephone Encounter (Signed)
 Prescription refill request for Eliquis  received. Indication: PAF Last office visit: 11/25/23  D Wittenborn NP Scr: 1.04 on 03/15/24  Epic Age: 73 Weight: 102.5kg  Based on above findings Eliquis  5mg  twice daily is the appropriate dose.  Refill approved.

## 2024-05-01 ENCOUNTER — Other Ambulatory Visit: Payer: Self-pay

## 2024-05-05 ENCOUNTER — Other Ambulatory Visit: Payer: Self-pay

## 2024-05-08 DIAGNOSIS — H35371 Puckering of macula, right eye: Secondary | ICD-10-CM | POA: Diagnosis not present

## 2024-05-08 DIAGNOSIS — E103492 Type 1 diabetes mellitus with severe nonproliferative diabetic retinopathy without macular edema, left eye: Secondary | ICD-10-CM | POA: Diagnosis not present

## 2024-05-08 DIAGNOSIS — E103411 Type 1 diabetes mellitus with severe nonproliferative diabetic retinopathy with macular edema, right eye: Secondary | ICD-10-CM | POA: Diagnosis not present

## 2024-05-08 DIAGNOSIS — H401112 Primary open-angle glaucoma, right eye, moderate stage: Secondary | ICD-10-CM | POA: Diagnosis not present

## 2024-05-08 DIAGNOSIS — E103511 Type 1 diabetes mellitus with proliferative diabetic retinopathy with macular edema, right eye: Secondary | ICD-10-CM | POA: Diagnosis not present

## 2024-05-08 DIAGNOSIS — H04123 Dry eye syndrome of bilateral lacrimal glands: Secondary | ICD-10-CM | POA: Diagnosis not present

## 2024-05-08 DIAGNOSIS — H1131 Conjunctival hemorrhage, right eye: Secondary | ICD-10-CM | POA: Diagnosis not present

## 2024-05-10 ENCOUNTER — Other Ambulatory Visit: Payer: Self-pay

## 2024-05-10 ENCOUNTER — Telehealth: Payer: Self-pay | Admitting: Internal Medicine

## 2024-05-10 NOTE — Telephone Encounter (Signed)
 Spoke with patient. Patient wanting to see about having colonoscopy sooner than recall. Experiencing rectal pain with some bleeding. Appointment scheduled with Dr Abran 11/18 @ 10:40.  Patient voiced understanding.

## 2024-05-10 NOTE — Telephone Encounter (Signed)
 Inbound call from patient requesting to schedule a colonoscopy. I advised her that she is not due until 2027. She states that she is having  issues  and that's why she wanted to schedule. She requested to speak with the nurse to discuss further. Please advise.

## 2024-05-12 ENCOUNTER — Emergency Department (HOSPITAL_BASED_OUTPATIENT_CLINIC_OR_DEPARTMENT_OTHER)
Admission: EM | Admit: 2024-05-12 | Discharge: 2024-05-13 | Disposition: A | Discharge status: Home / Self Care | Attending: Emergency Medicine | Admitting: Emergency Medicine

## 2024-05-12 ENCOUNTER — Other Ambulatory Visit: Payer: Self-pay

## 2024-05-12 ENCOUNTER — Emergency Department (HOSPITAL_BASED_OUTPATIENT_CLINIC_OR_DEPARTMENT_OTHER): Admitting: Radiology

## 2024-05-12 ENCOUNTER — Encounter (HOSPITAL_BASED_OUTPATIENT_CLINIC_OR_DEPARTMENT_OTHER): Payer: Self-pay

## 2024-05-12 DIAGNOSIS — I509 Heart failure, unspecified: Secondary | ICD-10-CM | POA: Insufficient documentation

## 2024-05-12 DIAGNOSIS — I4891 Unspecified atrial fibrillation: Secondary | ICD-10-CM | POA: Insufficient documentation

## 2024-05-12 DIAGNOSIS — R531 Weakness: Secondary | ICD-10-CM

## 2024-05-12 DIAGNOSIS — R0789 Other chest pain: Secondary | ICD-10-CM | POA: Diagnosis not present

## 2024-05-12 DIAGNOSIS — I110 Hypertensive heart disease with heart failure: Secondary | ICD-10-CM | POA: Insufficient documentation

## 2024-05-12 DIAGNOSIS — R600 Localized edema: Secondary | ICD-10-CM | POA: Insufficient documentation

## 2024-05-12 DIAGNOSIS — Z794 Long term (current) use of insulin: Secondary | ICD-10-CM | POA: Insufficient documentation

## 2024-05-12 DIAGNOSIS — Z79899 Other long term (current) drug therapy: Secondary | ICD-10-CM | POA: Insufficient documentation

## 2024-05-12 DIAGNOSIS — R059 Cough, unspecified: Secondary | ICD-10-CM | POA: Insufficient documentation

## 2024-05-12 DIAGNOSIS — Z7901 Long term (current) use of anticoagulants: Secondary | ICD-10-CM | POA: Insufficient documentation

## 2024-05-12 DIAGNOSIS — I2510 Atherosclerotic heart disease of native coronary artery without angina pectoris: Secondary | ICD-10-CM | POA: Insufficient documentation

## 2024-05-12 LAB — BASIC METABOLIC PANEL WITH GFR
Anion gap: 10 (ref 5–15)
BUN: 17 mg/dL (ref 8–23)
CO2: 29 mmol/L (ref 22–32)
Calcium: 9.9 mg/dL (ref 8.9–10.3)
Chloride: 104 mmol/L (ref 98–111)
Creatinine, Ser: 1.11 mg/dL — ABNORMAL HIGH (ref 0.44–1.00)
GFR, Estimated: 53 mL/min — ABNORMAL LOW (ref >60)
Glucose, Bld: 149 mg/dL — ABNORMAL HIGH (ref 70–99)
Potassium: 3.7 mmol/L (ref 3.5–5.1)
Sodium: 143 mmol/L (ref 135–145)

## 2024-05-12 LAB — URINALYSIS, ROUTINE W REFLEX MICROSCOPIC
Bacteria, UA: NONE SEEN
Bilirubin Urine: NEGATIVE
Glucose, UA: >1000 mg/dL — AB
Hgb urine dipstick: NEGATIVE
Ketones, ur: NEGATIVE mg/dL
Leukocytes,Ua: NEGATIVE
Nitrite: NEGATIVE
Protein, ur: NEGATIVE mg/dL
Specific Gravity, Urine: 1.039 — ABNORMAL HIGH (ref 1.005–1.030)
pH: 5.5 (ref 5.0–8.0)

## 2024-05-12 LAB — CBC
HCT: 40.8 % (ref 36.0–46.0)
Hemoglobin: 12.9 g/dL (ref 12.0–15.0)
MCH: 27.7 pg (ref 26.0–34.0)
MCHC: 31.6 g/dL (ref 30.0–36.0)
MCV: 87.7 fL (ref 80.0–100.0)
Platelets: 212 K/uL (ref 150–400)
RBC: 4.65 MIL/uL (ref 3.87–5.11)
RDW: 15.3 % (ref 11.5–15.5)
WBC: 8.1 K/uL (ref 4.0–10.5)
nRBC: 0 % (ref 0.0–0.2)

## 2024-05-12 LAB — HEPATIC FUNCTION PANEL
ALT: 40 U/L (ref 0–44)
AST: 27 U/L (ref 15–41)
Albumin: 4.1 g/dL (ref 3.5–5.0)
Alkaline Phosphatase: 111 U/L (ref 38–126)
Bilirubin, Direct: 0.1 mg/dL (ref 0.0–0.2)
Indirect Bilirubin: 0.2 mg/dL — ABNORMAL LOW (ref 0.3–0.9)
Total Bilirubin: 0.3 mg/dL (ref 0.0–1.2)
Total Protein: 6.5 g/dL (ref 6.5–8.1)

## 2024-05-12 LAB — TROPONIN T, HIGH SENSITIVITY: Troponin T High Sensitivity: <15 ng/L (ref 0–19)

## 2024-05-12 LAB — PRO BRAIN NATRIURETIC PEPTIDE: Pro Brain Natriuretic Peptide: <50 pg/mL (ref <300.0)

## 2024-05-12 NOTE — ED Provider Notes (Signed)
  EMERGENCY DEPARTMENT AT [facility] Provider Note   CSN: [id number] Arrival date & time: 05/12/24  2129     Patient presents with: Weakness   Heidi [name] is a 73 y.o. female.   73 year old female with history of CHF, nonobstructive CAD, atrial fibrillation on Eliquis , and hypertension who presents emergency department generalized weakness.  For the past 2 days has had generalized weakness.  No fevers but has had some chills.  Has had a nonproductive cough.  Says that she has 6 contacts at church with COVID.  Today also experienced substernal chest discomfort that she has difficulty characterizing.  Says that she took some nitroglycerin  and it resolved.  No diaphoresis or vomiting.  Has had decreased urine output for several days and increased bilateral leg swelling as well.  Has been compliant with her Eliquis        Prior to Admission medications   Medication Sig Start Date End Date Taking? Authorizing Provider  ACCU-CHEK GUIDE TEST test strip TEST BLOOD SUGAR THREE TO FOUR TIMES DAILY WITH MEALS AND AT BEDTIME 12/20/23   Vicci Barnie NOVAK, MD  Accu-Chek Softclix Lancets lancets TEST BLOOD SUGAR FOUR TIMES DAILY AS DIRECTED 12/20/23   Vicci Barnie NOVAK, MD  acetaminophen  (TYLENOL ) 500 MG tablet Take 500-1,000 mg by mouth every 6 (six) hours as needed (pain).    [provider]  albuterol  (PROVENTIL ) (2.5 MG/3ML) 0.083% nebulizer solution Take 3 mLs (2.5 mg total) by nebulization every 6 (six) hours as needed for wheezing or shortness of breath. 09/27/23   Theotis Haze ORN, NP  albuterol  (VENTOLIN  HFA) 108 (90 Base) MCG/ACT inhaler Inhale 1-2 puffs into the lungs every 4 (four) hours as needed for wheezing or shortness of breath. 09/27/23   Theotis Haze ORN, NP  Alcohol  Swabs  (DROPSAFE ALCOHOL  PREP) 70 % PADS USE AS DIRECTED 08/09/23   Vicci Barnie NOVAK, MD  apixaban  (ELIQUIS ) 5 MG TABS tablet Take 1 tablet (5 mg total) by mouth 2 (two) times daily. 08/25/23    Vicci Barnie NOVAK, MD  apixaban  (ELIQUIS ) 5 MG TABS tablet Take 1 tablet (5 mg total) by mouth 2 (two) times daily. 04/26/24   Patwardhan, Newman PARAS, MD  atenolol  (TENORMIN ) 25 MG tablet Take 1 tablet (25 mg total) by mouth daily. 11/25/23   Loistine Barnie, NP  atorvastatin  (LIPITOR) 40 MG tablet TAKE 1 TABLET BY MOUTH EVERY DAY 05/04/23   Vicci Barnie NOVAK, MD  Blood Glucose Calibration (ACCU-CHEK GUIDE CONTROL) LIQD L1-L2 Control Solution. UAD 08/04/22   Vicci Barnie NOVAK, MD  Blood Glucose Monitoring Suppl (ACCU-CHEK GUIDE) w/Device KIT UAD 08/04/22   Vicci Barnie NOVAK, MD  budesonide -formoterol  (SYMBICORT ) 80-4.5 MCG/ACT inhaler Inhale 2 puffs into the lungs in the morning and at bedtime. 03/18/22   Cobb, Comer GAILS, NP  diltiazem  (CARDIZEM  CD) 120 MG 24 hr capsule TAKE 1 CAPSULE EVERY DAY 04/17/24   Patwardhan, Manish J, MD  empagliflozin  (JARDIANCE ) 10 MG TABS tablet Take 1 tablet (10 mg total) by mouth daily. 08/19/23   Vicci Barnie NOVAK, MD  famotidine  (PEPCID ) 20 MG tablet TAKE 1 TABLET (20 MG TOTAL) BY MOUTH DAILY. 02/15/24   Vicci Barnie NOVAK, MD  furosemide  (LASIX ) 40 MG tablet Take 1 tablet (40 mg total) by mouth daily. 11/25/23   Loistine Barnie, NP  insulin  lispro protamine-lispro (HUMALOG  MIX 75/25) (75-25) 100 UNIT/ML SUSP injection Inject 0.55 mLs (55 Units total) into the skin every morning AND 0.45 mLs (45 Units total) every evening. 03/02/24  Vicci Barnie NOVAK, MD  Insulin  Syringe-Needle U-100 (INSULIN  SYRINGE 1CC/31GX5/16) 31G X 5/16 1 ML MISC Use to inject Humalog  75/25 mix twice daily 07/26/23   Vicci Barnie NOVAK, MD  ipratropium (ATROVENT ) 0.03 % nasal spray Place 2 sprays into both nostrils every 12 (twelve) hours. 04/10/24   Fleming, Zelda W, NP  loratadine  (CLARITIN ) 10 MG tablet Take 1 tablet (10 mg total) by mouth daily as needed for allergies. 08/19/23   Vicci Barnie NOVAK, MD  losartan  (COZAAR ) 50 MG tablet TAKE 1 TABLET EVERY DAY 09/03/23   Vicci Barnie NOVAK,  MD  montelukast  (SINGULAIR ) 10 MG tablet Take 1 tablet (10 mg total) by mouth at bedtime. 01/27/23   Desai, Nikita S, MD  Multiple Vitamin (MULTIVITAMIN WITH MINERALS) TABS tablet Take 1 tablet by mouth in the morning. Centrum Silver    [provider]  nitroGLYCERIN  (NITROSTAT ) 0.3 MG SL tablet DISSOLVE 1 TABLET UNDER THE  TONGUE EVERY 5 MINUTES AS NEEDED FOR CHEST PAIN. MAX OF 3 TABLETS IN 15 MINUTES. CALL 911 IF PAIN  PERSISTS. Strength: 0.3 mg 11/25/23   Loistine Barnie, NP  OXYGEN  Inhale 3 L into the lungs continuous.    [provider]  potassium chloride  SA (KLOR-CON  M) 20 MEQ tablet TAKE 1 TABLET EVERY DAY 03/17/24   Vicci Barnie NOVAK, MD  Pseudoeph-Doxylamine-DM-APAP (NYQUIL PO) Take by mouth as needed.    [provider]  tiZANidine  (ZANAFLEX ) 2 MG tablet TAKE 1 TABLET BY MOUTH DAILY AS NEEDED FOR MUSCLE SPASM 04/07/24   Vicci Barnie NOVAK, MD  topiramate  (TOPAMAX ) 25 MG tablet Take 1 tablet (25 mg total) by mouth 2 (two) times daily. Take with 50 mg tablets for total of 75 mg daily. 10/28/23   Ines Onetha NOVAK, MD  topiramate  (TOPAMAX ) 50 MG tablet Take 1 tablet (50 mg total) by mouth 2 (two) times daily. Take with 25mg  for total of 75mg  TWICE DAILY. 10/28/23   Ines Onetha NOVAK, MD  Ubrogepant  (UBRELVY ) 100 MG TABS Take 1 tablet onset migraine, may take another tablet if needed 2 hours later. (2 tab max/daily) 10/28/23   Ines Onetha NOVAK, MD    Allergies: Januvia  [sitagliptin ] and Tramadol     Review of Systems  Updated Vital Signs BP (!) 130/55 (BP Location: Right Arm)   Pulse 61   Temp (!) 97.5 F (36.4 C)   Resp 17   Ht 5' 4 (1.626 m)   Wt 102.5 kg   SpO2 95%   BMI 38.79 kg/m   Physical Exam Vitals and nursing note reviewed.  Constitutional:      General: She is not in acute distress.    Appearance: She is well-developed.  HENT:     Head: Normocephalic and atraumatic.     Right Ear: External ear normal.     Left Ear: External ear normal.      Nose: Nose normal.  Eyes:     Extraocular Movements: Extraocular movements intact.     Conjunctiva/sclera: Conjunctivae normal.     Pupils: Pupils are equal, round, and reactive to light.  Cardiovascular:     Rate and Rhythm: Normal rate and regular rhythm.     Heart sounds: No murmur heard. Pulmonary:     Effort: Pulmonary effort is normal. No respiratory distress.     Breath sounds: Normal breath sounds.  Musculoskeletal:     Cervical back: Normal range of motion and neck supple.     Right lower leg: Edema present.     Left  lower leg: Edema present.  Skin:    General: Skin is warm and dry.  Neurological:     Mental Status: She is alert and oriented to person, place, and time. Mental status is at baseline.  Psychiatric:        Mood and Affect: Mood normal.     (all labs ordered are listed, but only abnormal results are displayed) Labs Reviewed  URINALYSIS, ROUTINE W REFLEX MICROSCOPIC - Abnormal; Notable for the following components:      Result Value   Specific Gravity, Urine 1.039 (*)    Glucose, UA >1,000 (*)    All other components within normal limits  BASIC METABOLIC PANEL WITH GFR - Abnormal; Notable for the following components:   Glucose, Bld 149 (*)    Creatinine, Ser 1.11 (*)    GFR, Estimated 53 (*)    All other components within normal limits  HEPATIC FUNCTION PANEL - Abnormal; Notable for the following components:   Indirect Bilirubin 0.2 (*)    All other components within normal limits  RESP PANEL BY RT-PCR (RSV, FLU A&B, COVID)  RVPGX2  CBC  PRO BRAIN NATRIURETIC PEPTIDE  TROPONIN T, HIGH SENSITIVITY  TROPONIN T, HIGH SENSITIVITY    EKG: EKG Interpretation Date/Time:  Friday May 12 2024 21:41:14 EST Ventricular Rate:  63 PR Interval:  192 QRS Duration:  76 QT Interval:  376 QTC Calculation: 384 R Axis:   130  Text Interpretation: Normal sinus rhythm Right axis deviation Low voltage QRS Cannot rule out Anterior infarct (cited on or before  28-Sep-2023) Abnormal ECG When compared with ECG of 15-Mar-2024 13:10, QRS axis Shifted right Confirmed by Yolande Charleston [phone]) on 05/12/2024 11:31:12 PM  Radiology: DG Chest 2 View Result Date: 05/12/2024 EXAM: 2 VIEW(S) XRAY OF THE CHEST 05/12/2024 10:57:00 PM COMPARISON: 03/15/2024 CLINICAL HISTORY: cough, weakness FINDINGS: LUNGS AND PLEURA: Lungs are well aerated bilaterally. No focal infiltrate or effusion is seen. No pneumothorax. HEART AND MEDIASTINUM: Cardiac shadow is at the upper limits of normal in size. BONES AND SOFT TISSUES: No bony abnormality is noted. IMPRESSION: 1. No acute cardiopulmonary process. Electronically signed by: Oneil Devonshire MD 05/12/2024 11:03 PM EST RP Workstation: [id number]     Procedures   Medications Ordered in the ED  lactated ringers  bolus 1,000 mL (1,000 mLs Intravenous New Bag/Given 05/13/24 0036)    Clinical Course as of 05/13/24 0037  Fri May 12, 2024  2331 Signed out to Dr Jerral.  [RP]  2331 Stable HO RP Weakness and fatigue Early in workup COVID+ contact. [CC]    Clinical Course User Index [CC] Jerral Meth, MD [RP] Yolande Charleston BROCKS, MD                                 Medical Decision Making Amount and/or Complexity of Data Reviewed Labs: ordered. Radiology: ordered.   72 year old female with history of CHF, nonobstructive CAD, atrial fibrillation on Eliquis , and hypertension who presents emergency department generalized weakness.   Initial Ddx:  MI, URI, pneumonia, AKI, UTI, CHF  MDM/Course:  Patient presents emergency department generalized weakness.  Also has been having a cough and several other symptoms including lower extremity swelling bilaterally and urinary retention.  Does have sick contacts with COVID.  On exam she is not in acute distress.  Heart and lung sound clear.  Does have lower extremity edema.  Had an EKG that shows low voltage for R wave progression  but no other ischemic changes.  For her chest pain  did send troponins as well that are pending at this point in time.  Did consider pulmonary embolism given the fact that she is completely reliant with her Eliquis  feels slightly unlikely.  Her lower extremity swelling is bilateral as well making DVT less likely.  Send a COVID and flu to chest for URI.  Chest x-ray is negative for acute process.  Urinalysis does not appear to be consistent with UTI.  Nursing is going to perform a bladder scan to ensure that she is not retaining urine.  Upon re-evaluation patient remained stable.  Signed out to the oncoming physician awaiting the remainder of her workup  This patient presents to the ED for concern of complaints listed in HPI, this involves an extensive number of treatment options, and is a complaint that carries with it a high risk of complications and morbidity. Disposition including potential need for admission considered.   Dispo: Pending remainder of workup  Additional history obtained from daughter Records reviewed Outpatient Clinic Notes The following labs were independently interpreted: Chemistry and show CKD I independently reviewed the following imaging with scope of interpretation limited to determining acute life threatening conditions related to emergency care: Chest x-ray and agree with the radiologist interpretation with the following exceptions: none I personally reviewed and interpreted cardiac monitoring: normal sinus rhythm  I personally reviewed and interpreted the pt's EKG: see above for interpretation  I have reviewed the patients home medications and made adjustments as needed Social Determinants of health:  Geriatric  Portions of this note were generated with Scientist, clinical (histocompatibility and immunogenetics). Dictation errors may occur despite best attempts at proofreading.     Final diagnoses:  Generalized weakness  Cough, unspecified type    ED Discharge Orders     None          Yolande Lamar BROCKS, MD 05/13/24 [phone]

## 2024-05-12 NOTE — ED Notes (Signed)
 Bladder scanner 

## 2024-05-12 NOTE — ED Triage Notes (Signed)
 Pt reports increased weakness and decreased urine output for several days. Pt also reports mild chest pain and headache.

## 2024-05-13 LAB — TROPONIN T, HIGH SENSITIVITY: Troponin T High Sensitivity: <15 ng/L (ref 0–19)

## 2024-05-13 LAB — RESP PANEL BY RT-PCR (RSV, FLU A&B, COVID)  RVPGX2
Influenza A by PCR: NEGATIVE
Influenza B by PCR: NEGATIVE
Resp Syncytial Virus by PCR: NEGATIVE
SARS Coronavirus 2 by RT PCR: NEGATIVE

## 2024-05-13 MED ORDER — LACTATED RINGERS IV BOLUS
1000.0000 mL | Freq: Once | INTRAVENOUS | Status: AC
  Administered 2024-05-13: 1000 mL via INTRAVENOUS

## 2024-05-13 NOTE — ED Notes (Signed)
 RN reviewed discharge instructions with pt. Pt verbalized understanding and had no further questions. VSS upon discharge.

## 2024-05-13 NOTE — ED Provider Notes (Signed)
 Care of patient received from prior provider at 1:49 AM, please see their note for complete H/P and care plan.  Received handoff per ED course.  Clinical Course as of 05/13/24 0149  [place] May 12, 2024  2331 Signed out to Dr Jerral.  [RP]  2331 Stable HO RP Weakness and fatigue Early in workup COVID+ contact. [CC]    Clinical Course User Index [CC] Jerral Meth, MD [RP] Yolande Lamar BROCKS, MD    Reassessment: All symptoms resolved on serial reassessment.  Patient resting comfortably.  She was comfortable with outpatient care and management.  Family ember at bedside will help arrange follow-up with PCP.     Jerral Meth, MD 05/13/24 [phone]

## 2024-05-17 DIAGNOSIS — Z1231 Encounter for screening mammogram for malignant neoplasm of breast: Secondary | ICD-10-CM | POA: Diagnosis not present

## 2024-05-18 ENCOUNTER — Telehealth: Payer: Self-pay | Admitting: Internal Medicine

## 2024-05-18 NOTE — Telephone Encounter (Signed)
Confirmed appt for 11/14

## 2024-05-19 ENCOUNTER — Telehealth: Payer: Self-pay

## 2024-05-19 ENCOUNTER — Other Ambulatory Visit: Payer: Self-pay

## 2024-05-19 ENCOUNTER — Ambulatory Visit: Attending: Internal Medicine | Admitting: Internal Medicine

## 2024-05-19 DIAGNOSIS — I480 Paroxysmal atrial fibrillation: Secondary | ICD-10-CM

## 2024-05-19 DIAGNOSIS — I130 Hypertensive heart and chronic kidney disease with heart failure and stage 1 through stage 4 chronic kidney disease, or unspecified chronic kidney disease: Secondary | ICD-10-CM | POA: Diagnosis not present

## 2024-05-19 DIAGNOSIS — J4550 Severe persistent asthma, uncomplicated: Secondary | ICD-10-CM

## 2024-05-19 DIAGNOSIS — I5032 Chronic diastolic (congestive) heart failure: Secondary | ICD-10-CM | POA: Diagnosis not present

## 2024-05-19 DIAGNOSIS — E041 Nontoxic single thyroid nodule: Secondary | ICD-10-CM

## 2024-05-19 DIAGNOSIS — E1159 Type 2 diabetes mellitus with other circulatory complications: Secondary | ICD-10-CM

## 2024-05-19 DIAGNOSIS — N1831 Chronic kidney disease, stage 3a: Secondary | ICD-10-CM | POA: Diagnosis not present

## 2024-05-19 DIAGNOSIS — Z23 Encounter for immunization: Secondary | ICD-10-CM | POA: Diagnosis not present

## 2024-05-19 DIAGNOSIS — Z6838 Body mass index (BMI) 38.0-38.9, adult: Secondary | ICD-10-CM | POA: Diagnosis not present

## 2024-05-19 DIAGNOSIS — Z794 Long term (current) use of insulin: Secondary | ICD-10-CM

## 2024-05-19 DIAGNOSIS — E119 Type 2 diabetes mellitus without complications: Secondary | ICD-10-CM

## 2024-05-19 DIAGNOSIS — E1169 Type 2 diabetes mellitus with other specified complication: Secondary | ICD-10-CM

## 2024-05-19 DIAGNOSIS — Z111 Encounter for screening for respiratory tuberculosis: Secondary | ICD-10-CM

## 2024-05-19 DIAGNOSIS — Z780 Asymptomatic menopausal state: Secondary | ICD-10-CM

## 2024-05-19 DIAGNOSIS — Z789 Other specified health status: Secondary | ICD-10-CM

## 2024-05-19 LAB — POCT GLYCOSYLATED HEMOGLOBIN (HGB A1C): HbA1c, POC (controlled diabetic range): 9.4 % — AB (ref 0.0–7.0)

## 2024-05-19 MED ORDER — FAMOTIDINE 20 MG PO TABS
20.0000 mg | ORAL_TABLET | Freq: Every day | ORAL | 0 refills | Status: AC
  Filled 2024-05-19: qty 90, 90d supply, fill #0

## 2024-05-19 MED ORDER — SEMAGLUTIDE(0.25 OR 0.5MG/DOS) 2 MG/3ML ~~LOC~~ SOPN
0.2500 mg | PEN_INJECTOR | SUBCUTANEOUS | 0 refills | Status: AC
  Filled 2024-05-19: qty 3, 56d supply, fill #0

## 2024-05-19 MED ORDER — APIXABAN 5 MG PO TABS
5.0000 mg | ORAL_TABLET | Freq: Two times a day (BID) | ORAL | 1 refills | Status: AC
  Filled 2024-05-19 – 2024-06-05 (×2): qty 180, 90d supply, fill #0

## 2024-05-19 MED ORDER — EMPAGLIFLOZIN 10 MG PO TABS
10.0000 mg | ORAL_TABLET | Freq: Every day | ORAL | 1 refills | Status: AC
  Filled 2024-05-19 – 2024-07-19 (×4): qty 90, 90d supply, fill #0

## 2024-05-19 MED ORDER — MONTELUKAST SODIUM 10 MG PO TABS
10.0000 mg | ORAL_TABLET | Freq: Every day | ORAL | 2 refills | Status: AC
  Filled 2024-05-19: qty 90, 90d supply, fill #0

## 2024-05-19 NOTE — Progress Notes (Signed)
 Patient ID: Heidi [name], female    DOB: Jul 22, 1950  MRN: [id number]  CC: Follow-up (ER f/u. [facility] visit/Requesting TB test for work/Flu vax administered on 05/19/24 - C.A.)   Subjective: Heidi [name] is a 73 y.o. female who presents for chronic ds management. Daughter, Heidi [name],  is with her Her concerns today include:  history of HTN, DM 2 with retinopathy, HL, CKD 3, diastolic CHF, a. Fib (02/2022) hypoxic respiratory failure/cough variant asthma vs moderate persistent asthma 2 L O2 continuously, migraines [facility] on Botox  inj), OSA (declines CPAP or repeat sleep study), UACS, GERD, OA knees,  DDD/spondylosis of cervical spine,obesity, renal cyst, LT thyroid  nodule.   Discussed the use of AI scribe software for clinical note transcription with the patient, who gave verbal consent to proceed.  History of Present Illness Heidi [name] is a 73 year old female with diabetes, hypertension, asthma, and congestive heart failure who presents for follow-up of her chronic medical conditions.  She recently experienced dehydration and weakness, prompting a visit to the emergency room. During this visit, she was tested for COVID-19 and flu, both of which were negative. A chest x-ray showed no pneumonia, and a pro BNP level was normal.  DM:  Results for orders placed or performed in visit on 05/19/24  POCT glycosylated hemoglobin (Hb A1C)   Collection Time: 05/19/24  2:17 PM  Result Value Ref Range   Hemoglobin A1C     HbA1c POC (<> result, manual entry)     HbA1c, POC (prediabetic range)     HbA1c, POC (controlled diabetic range) 9.4 (A) 0.0 - 7.0 %   *Note: Due to a large number of results and/or encounters for the requested time period, some results have not been displayed. A complete set of results can be found in Results Review.  Her diabetes is managed with Humalog  75/25, taking 55 units in the morning and 45 units in the evening, along with Jardiance  10 mg daily.  She forgot to bring BS log again today. Blood sugar levels range from 105 to 200 mg/dL, with occasional hypoglycemic episodes. She has difficulty adhering to dietary recommendations and is working on improving her diet.  HTN/CHFpEF/PAF: managed with diltiazem  120 mg daily, atenolol  25 mg daily, Cozaar  50 mg daily, jardiance , Furosemide  20 mg daily and Eliquis  5 mg twice a day. No bruising or bleeding on Eliquis . No CP or LE edema. GFR has remained stable in the 50s.  Asthma is managed with regular use of Symbicort  twice a day and albuterol  as needed, approximately once a day, not every day. She uses oxygen  at night.  Her thyroid  nodule, previously biopsied in 2016 and found benign, and stable on last US  done 2022 , and she is due for a follow-up ultrasound.  She is about to start a new job with [employer], [place] and needs to have TB testing done.  HM: She recently had a mammogram on May 16, 2024, which returned normal results. She is due for a colon cancer screening and has an upcoming appointment with GI to discuss a colonoscopy. She also plans to have a bone density study scheduled for the new year.      Patient Active Problem List   Diagnosis Date Noted   Severe persistent chronic asthma without complication (HCC) 09/21/2022   Stage 3b chronic kidney disease (HCC) 09/21/2022   Chronic diastolic CHF (congestive heart failure) (HCC) 03/18/2022   PAF (paroxysmal atrial fibrillation) (HCC)    Unstable  angina (HCC)    Chronic respiratory failure with hypoxia (HCC) 03/03/2022   Moderate persistent asthma 03/03/2022   Migraines 03/03/2022   Moderate persistent asthma without complication 09/04/2021   History of colonic polyps    Benign neoplasm of ascending colon    Benign neoplasm of transverse colon    Benign neoplasm of descending colon    Postural dizziness with presyncope 02/10/2021   Acute encephalopathy 01/27/2021   Fall at home, initial encounter 01/27/2021    Pneumonia due to COVID-19 virus 08/10/2020   Encephalopathy due to COVID-19 virus 08/10/2020   Acute kidney injury superimposed on CKD 08/10/2020   COVID 08/10/2020   Encounter for medication review and counseling 03/18/2020   Medication management 02/28/2020   Exertional dyspnea 01/03/2020   Breast nodule 11/24/2019   Chronic respiratory failure with hypoxia, on home oxygen  therapy (HCC) 11/13/2019   Chest pain 08/24/2019   Diabetic retinopathy of right eye associated with type 2 diabetes mellitus (HCC) 08/16/2018   Upper airway cough syndrome 05/03/2018   Morbid obesity due to excess calories (HCC) complicated by hbp/ dm / hyperlipidemia 09/10/2017   Cough variant asthma vs UACS/vcd on ACEi  09/09/2017   Anxiety and depression 09/06/2017   Mild persistent asthma without complication 09/06/2017   Torn left ear lobe 11/20/2016   Heart failure with preserved ejection fraction (HCC) 06/27/2016   Post-menopausal atrophic vaginitis 11/14/2015   Primary osteoarthritis of both knees 07/18/2015   GERD (gastroesophageal reflux disease) 05/08/2015   Renal cyst 05/08/2015   Adenomatous polyp of colon 03/26/2015   Essential hypertension 02/12/2015   Cognitive deficit due to old head trauma 02/12/2015   Chronic left shoulder pain 02/11/2015   Thyroid  nodule 12/18/2014   Chronic neck pain    HLD (hyperlipidemia)    OSA treated with BiPAP 11/27/2014   Obesity hypoventilation syndrome (HCC) 11/27/2014   Demand ischemia (HCC)    Uncontrolled type 2 diabetes mellitus with hyperglycemia (HCC)    Cardiomegaly 11/25/2014   DM type 2 causing CKD stage 2 (HCC) 11/25/2014     Current Outpatient Medications on File Prior to Visit  Medication Sig Dispense Refill   ACCU-CHEK GUIDE TEST test strip TEST BLOOD SUGAR THREE TO FOUR TIMES DAILY WITH MEALS AND AT BEDTIME 350 strip 3   Accu-Chek Softclix Lancets lancets TEST BLOOD SUGAR FOUR TIMES DAILY AS DIRECTED 400 each 3   acetaminophen  (TYLENOL ) 500  MG tablet Take 500-1,000 mg by mouth every 6 (six) hours as needed (pain).     albuterol  (PROVENTIL ) (2.5 MG/3ML) 0.083% nebulizer solution Take 3 mLs (2.5 mg total) by nebulization every 6 (six) hours as needed for wheezing or shortness of breath. 75 mL 5   albuterol  (VENTOLIN  HFA) 108 (90 Base) MCG/ACT inhaler Inhale 1-2 puffs into the lungs every 4 (four) hours as needed for wheezing or shortness of breath. 18 g 5   Alcohol  Swabs  (DROPSAFE ALCOHOL  PREP) 70 % PADS USE AS DIRECTED 300 each 3   apixaban  (ELIQUIS ) 5 MG TABS tablet Take 1 tablet (5 mg total) by mouth 2 (two) times daily. 60 tablet 5   atenolol  (TENORMIN ) 25 MG tablet Take 1 tablet (25 mg total) by mouth daily. 90 tablet 3   atorvastatin  (LIPITOR) 40 MG tablet TAKE 1 TABLET BY MOUTH EVERY DAY 90 tablet 2   Blood Glucose Calibration (ACCU-CHEK GUIDE CONTROL) LIQD L1-L2 Control Solution. UAD 1 each 1   Blood Glucose Monitoring Suppl (ACCU-CHEK GUIDE) w/Device KIT UAD 1 kit 0   budesonide -formoterol  (  SYMBICORT ) 80-4.5 MCG/ACT inhaler Inhale 2 puffs into the lungs in the morning and at bedtime. 1 each 5   diltiazem  (CARDIZEM  CD) 120 MG 24 hr capsule TAKE 1 CAPSULE EVERY DAY 90 capsule 2   furosemide  (LASIX ) 40 MG tablet Take 1 tablet (40 mg total) by mouth daily. 90 tablet 3   insulin  lispro protamine-lispro (HUMALOG  MIX 75/25) (75-25) 100 UNIT/ML SUSP injection Inject 0.55 mLs (55 Units total) into the skin every morning AND 0.45 mLs (45 Units total) every evening. 30 mL 6   Insulin  Syringe-Needle U-100 (INSULIN  SYRINGE 1CC/31GX5/16) 31G X 5/16 1 ML MISC Use to inject Humalog  75/25 mix twice daily 200 each 6   ipratropium (ATROVENT ) 0.03 % nasal spray Place 2 sprays into both nostrils every 12 (twelve) hours. 30 mL 0   loratadine  (CLARITIN ) 10 MG tablet Take 1 tablet (10 mg total) by mouth daily as needed for allergies. 90 tablet 0   losartan  (COZAAR ) 50 MG tablet TAKE 1 TABLET EVERY DAY 90 tablet 3   Multiple Vitamin (MULTIVITAMIN WITH  MINERALS) TABS tablet Take 1 tablet by mouth in the morning. Centrum Silver     nitroGLYCERIN  (NITROSTAT ) 0.3 MG SL tablet DISSOLVE 1 TABLET UNDER THE  TONGUE EVERY 5 MINUTES AS NEEDED FOR CHEST PAIN. MAX OF 3 TABLETS IN 15 MINUTES. CALL 911 IF PAIN  PERSISTS. Strength: 0.3 mg 25 tablet 3   OXYGEN  Inhale 3 L into the lungs continuous.     potassium chloride  SA (KLOR-CON  M) 20 MEQ tablet TAKE 1 TABLET EVERY DAY 90 tablet 0   tiZANidine  (ZANAFLEX ) 2 MG tablet TAKE 1 TABLET BY MOUTH DAILY AS NEEDED FOR MUSCLE SPASM 30 tablet 1   topiramate  (TOPAMAX ) 25 MG tablet Take 1 tablet (25 mg total) by mouth 2 (two) times daily. Take with 50 mg tablets for total of 75 mg daily. 180 tablet 4   topiramate  (TOPAMAX ) 50 MG tablet Take 1 tablet (50 mg total) by mouth 2 (two) times daily. Take with 25mg  for total of 75mg  TWICE DAILY. 180 tablet 4   Ubrogepant  (UBRELVY ) 100 MG TABS Take 1 tablet onset migraine, may take another tablet if needed 2 hours later. (2 tab max/daily) 16 tablet 11   No current facility-administered medications on file prior to visit.    Allergies  Allergen Reactions   Januvia  [Sitagliptin ]     Chest pains   Tramadol  Other (See Comments)    hallucinations    Social History   Socioeconomic History   Marital status: Widowed    Spouse name: Not on file   Number of children: 4   Years of education: Not on file   Highest education level: Not on file  Occupational History   Occupation: Disabled  Tobacco Use   Smoking status: Never    Passive exposure: Never   Smokeless tobacco: Never  Vaping Use   Vaping status: Never Used  Substance and Sexual Activity   Alcohol  use: No   Drug use: No   Sexual activity: Yes  Other Topics Concern   Not on file  Social History Narrative   Lives with daughter   Caffeine use: 1 cup coffee per day   Social Drivers of Health   Financial Resource Strain: Low Risk  (03/28/2024)   Overall Financial Resource Strain (CARDIA)    Difficulty of  Paying Living Expenses: Not hard at all  Food Insecurity: No Food Insecurity (03/28/2024)   Hunger Vital Sign    Worried About Running Out of  Food in the Last Year: Never true    Ran Out of Food in the Last Year: Never true  Transportation Needs: No Transportation Needs (03/28/2024)   PRAPARE - Administrator, Civil Service (Medical): No    Lack of Transportation (Non-Medical): No  Physical Activity: Insufficiently Active (03/28/2024)   Exercise Vital Sign    Days of Exercise per Week: 6 days    Minutes of Exercise per Session: 10 min  Stress: No Stress Concern Present (03/28/2024)   [employer] of Occupational Health - Occupational Stress Questionnaire    Feeling of Stress: Not at all  Social Connections: Moderately Integrated (03/28/2024)   Social Connection and Isolation Panel    Frequency of Communication with Friends and Family: More than three times a week    Frequency of Social Gatherings with Friends and Family: Once a week    Attends Religious Services: More than 4 times per year    Active Member of [employer] or Organizations: Yes    Attends Banker Meetings: More than 4 times per year    Marital Status: Divorced  Intimate Partner Violence: Not At Risk (03/28/2024)   Humiliation, Afraid, Rape, and Kick questionnaire    Fear of Current or Ex-Partner: No    Emotionally Abused: No    Physically Abused: No    Sexually Abused: No    Family History  Problem Relation Age of Onset   Colon cancer Father    Diabetes type II Sister    Colon cancer Sister    Diabetes type II Brother    Migraines Neg Hx    Breast cancer Neg Hx     Past Surgical History:  Procedure Laterality Date   ABDOMINAL HYSTERECTOMY     CATARACT EXTRACTION Left    CATARACT EXTRACTION     LT 01/2017, 02/2017 on RT   CESAREAN SECTION     COLONOSCOPY WITH PROPOFOL  N/A 04/15/2021   Procedure: COLONOSCOPY WITH PROPOFOL ;  Surgeon: Abran Norleen SAILOR, MD;  Location: [facility];  Service:  Endoscopy;  Laterality: N/A;  patient is on O2   JOINT REPLACEMENT Left    Plate in Ankle   LEFT HEART CATH AND CORONARY ANGIOGRAPHY N/A 03/04/2022   Procedure: LEFT HEART CATH AND CORONARY ANGIOGRAPHY;  Surgeon: Elmira Newman PARAS, MD;  Location: [facility];  Service: Cardiovascular;  Laterality: N/A;   POLYPECTOMY  04/15/2021   Procedure: POLYPECTOMY;  Surgeon: Abran Norleen SAILOR, MD;  Location: [facility];  Service: Endoscopy;;    ROS: Review of Systems Negative except as stated above  PHYSICAL EXAM: BP 113/66 (BP Location: Left Arm, Patient Position: Sitting, Cuff Size: Normal)   Pulse 74   Temp 98.1 F (36.7 C) (Oral)   Ht 5' 4 (1.626 m)   Wt 226 lb (102.5 kg)   SpO2 95%   BMI 38.79 kg/m   Physical Exam  General appearance - alert, well appearing, obese older AAF and in no distress Mental status - Struggles sometimes with expressing ideas/thoughts Neck - supple, no significant adenopathy Chest - clear to auscultation, no wheezes, rales or rhonchi, symmetric air entry Heart - normal rate, regular rhythm, normal S1, S2, no murmurs, rubs, clicks or gallops Extremities - No LE edema Diabetic Foot Exam - Simple   Simple Foot Form  05/19/2024  2:30 PM  Visual Inspection See comments: Yes Sensation Testing Intact to touch and monofilament testing bilaterally: Yes Pulse Check Posterior Tibialis and Dorsalis pulse intact bilaterally: Yes Comments Flat  footed. Toenails overgrown.          Latest Ref Rng & Units 05/12/2024   11:23 PM 03/15/2024    1:45 PM 09/28/2023   10:02 PM  CMP  Glucose 70 - 99 mg/dL 850  896  817   BUN 8 - 23 mg/dL 17  12  19    Creatinine 0.44 - 1.00 mg/dL 8.88  8.95  8.81   Sodium 135 - 145 mmol/L 143  138  137   Potassium 3.5 - 5.1 mmol/L 3.7  3.6  3.8   Chloride 98 - 111 mmol/L 104  99  101   CO2 22 - 32 mmol/L 29  25  24    Calcium  8.9 - 10.3 mg/dL 9.9  9.7  9.4   Total Protein 6.5 - 8.1 g/dL 6.5     Total Bilirubin 0.0 - 1.2 mg/dL  0.3     Alkaline Phos 38 - 126 U/L 111     AST 15 - 41 U/L 27     ALT 0 - 44 U/L 40      Lipid Panel     Component Value Date/Time   CHOL 117 08/19/2023 1230   TRIG 165 (H) 08/19/2023 1230   HDL 50 08/19/2023 1230   CHOLHDL 2.3 08/19/2023 1230   CHOLHDL 2.6 01/28/2021 0523   VLDL 30 01/28/2021 0523   LDLCALC 40 08/19/2023 1230    CBC    Component Value Date/Time   WBC 8.1 05/12/2024 2315   RBC 4.65 05/12/2024 2315   HGB 12.9 05/12/2024 2315   HGB 13.1 09/27/2023 1635   HCT 40.8 05/12/2024 2315   HCT 40.2 09/27/2023 1635   PLT 212 05/12/2024 2315   PLT 230 09/27/2023 1635   MCV 87.7 05/12/2024 2315   MCV 87 09/27/2023 1635   MCH 27.7 05/12/2024 2315   MCHC 31.6 05/12/2024 2315   RDW 15.3 05/12/2024 2315   RDW 13.9 09/27/2023 1635   LYMPHSABS 3.7 (H) 09/27/2023 1635   MONOABS 0.3 03/03/2022 0551   EOSABS 0.3 09/27/2023 1635   BASOSABS 0.1 09/27/2023 1635    ASSESSMENT AND PLAN: 1. Type 2 diabetes mellitus with morbid obesity (HCC) (Primary) Obesity diagnosis associated with HTN and hyperlipidemia. DM not at goal due to dietary indiscretions. Dietary counseling given and encouraged. -Discussed trying harder with a GLP-1 agonist like Ozempic.  She does have a history of a thyroid  nodule that has been biopsied in the past and is benign.  -I went over with pt how the medication works and potential side effects including nausea, vomiting, diarrhea/constipation, bowel blockage, palpitations and pancreatitis.  -Advised to stop the medicine and be seen if pt develops any abdominal pain, vomiting, severe diarrhea/constipation or palpitations.  Patient expressed understanding and is agreeable to starting Ozempic 0.25 mg once a week.  After being on this for 1 month, if she tolerates the medication, advised that we will titrate to the next dose. -Continue Jardiance  10 mg daily. -Continue Humalog  mix 75/25 insulin , 55 units in a.m. and 45 units in p.m. -f/u with clinical pharmacist  in 1 mth - POCT glycosylated hemoglobin (Hb A1C) - empagliflozin  (JARDIANCE ) 10 MG TABS tablet; Take 1 tablet (10 mg total) by mouth daily.  Dispense: 90 tablet; Refill: 1 - Semaglutide,0.25 or 0.5MG /DOS, 2 MG/3ML SOPN; Inject 0.25 mg into the skin once a week.  Dispense: 3 mL; Refill: 0  2. Diabetes mellitus treated with oral medication (HCC) 3. Insulin  long-term use (HCC) See #1 above  4. Chronic  diastolic heart failure (HCC) Patient to continue furosemide  20 mg daily and Jardiance  - empagliflozin  (JARDIANCE ) 10 MG TABS tablet; Take 1 tablet (10 mg total) by mouth daily.  Dispense: 90 tablet; Refill: 1  5. Paroxysmal atrial fibrillation (HCC) Sounds to be in sinus rhythm on auscultation today.  Continue Eliquis , Cardizem  and atenolol  - apixaban  (ELIQUIS ) 5 MG TABS tablet; Take 1 tablet (5 mg total) by mouth 2 (two) times daily.  Dispense: 180 tablet; Refill: 1  6. Severe persistent chronic asthma without complication (HCC) Continue Symbicort  twice a day and albuterol  as needed - montelukast  (SINGULAIR ) 10 MG tablet; Take 1 tablet (10 mg total) by mouth at bedtime.  Dispense: 90 tablet; Refill: 2  7. Hypertension associated with type 2 diabetes mellitus (HCC) At goal.  Continue Cozaar , atenolol  and Cardizem   8. CKD stage 3a, GFR 45-59 ml/min (HCC) GFR has been stable in the 50s.  Advised patient that kidney function is not at 100% but stable.  We will continue to monitor  9. Thyroid  nodule - US  THYROID ; Future  10. Postmenopausal estrogen deficiency - DG Bone Density; Future  11. Screening-pulmonary TB - QuantiFERON-TB Gold Plus  12. Need for immunization against influenza - Flu vaccine trivalent PF, 6mos and older(Flulaval,Afluria,Fluarix,Fluzone)  Addendum: looks like pt may need document that she is vaccinated for hep B for her job. She had already left the office. My CMA called and LVMM informing her of this. I will place order to have immunity checked. Patient was given  the opportunity to ask questions.  Patient verbalized understanding of the plan and was able to repeat key elements of the plan.   This documentation was completed using Paediatric nurse.  Any transcriptional errors are unintentional.  Orders Placed This Encounter  Procedures   US  THYROID    DG Bone Density   Flu vaccine trivalent PF, 6mos and older(Flulaval,Afluria,Fluarix,Fluzone)   QuantiFERON-TB Gold Plus   POCT glycosylated hemoglobin (Hb A1C)     Requested Prescriptions   Signed Prescriptions Disp Refills   montelukast  (SINGULAIR ) 10 MG tablet 90 tablet 2    Sig: Take 1 tablet (10 mg total) by mouth at bedtime.   famotidine  (PEPCID ) 20 MG tablet 90 tablet 0    Sig: Take 1 tablet (20 mg total) by mouth daily.   apixaban  (ELIQUIS ) 5 MG TABS tablet 180 tablet 1    Sig: Take 1 tablet (5 mg total) by mouth 2 (two) times daily.   empagliflozin  (JARDIANCE ) 10 MG TABS tablet 90 tablet 1    Sig: Take 1 tablet (10 mg total) by mouth daily.   Semaglutide,0.25 or 0.5MG /DOS, 2 MG/3ML SOPN 3 mL 0    Sig: Inject 0.25 mg into the skin once a week.    Return in about 4 months (around 09/16/2024) for 4 weeks with clinical pharmacist for DM.  Barnie Louder, MD, FACP

## 2024-05-19 NOTE — Telephone Encounter (Signed)
 Called patient and LVM informing to call back and schedule a lab appointment for Hep B titer. Patient has no past history of Hep B vaccines that are required by her job.

## 2024-05-19 NOTE — Patient Instructions (Signed)
  VISIT SUMMARY: Today, we reviewed your chronic medical conditions, including diabetes, hypertension, asthma, and congestive heart failure. We also discussed your recent emergency room visit for dehydration and weakness, and reviewed your recent tests and imaging results.  YOUR PLAN: -TYPE 2 DIABETES MELLITUS WITH POOR GLYCEMIC CONTROL: Your blood sugar levels have been higher than desired, with an A1c of 9.4%. We discussed starting Ozempic to help with weight loss and better blood sugar control. You will start with 0.25 mg weekly and increase to 0.5 mg after one month if tolerated. Continue taking Humalog  75/25 and Jardiance  10 mg daily. Please work on reducing sweets and sugar in your diet. Follow up with the clinical pharmacist in one month to review your blood sugar readings and adjust the Ozempic dose if necessary.  -HYPERTENSION: Your blood pressure is well-controlled with your current medications. Continue taking your current antihypertensive medications as prescribed.  -CONGESTIVE HEART FAILURE: Your heart condition is stable with no signs of acute heart failure. Continue taking your current medications, including diltiazem , atenolol , and Cozaar .  -ATRIAL FIBRILLATION: Your irregular heartbeat is being managed with Eliquis , and there are no signs of bruising or bleeding. Continue taking Eliquis  5 mg twice daily.  -SEVERE PERSISTENT ASTHMA: Your asthma is well-managed with Symbicort , and you use albuterol  as needed. Continue using Symbicort  and albuterol  as needed, and continue using nocturnal oxygen  therapy.  -NONTOXIC SINGLE THYROID  NODULE, LEFT THYROID  GLAND: Your thyroid  nodule is stable in size and was previously found to be benign. We have ordered a thyroid  ultrasound to monitor the nodule size, especially since you are starting Ozempic.  -GENERAL HEALTH MAINTENANCE: Your recent mammogram was normal. You are due for a colon cancer screening and a bone density study. We have scheduled  your colonoscopy with Dr. Abran on November 18th and ordered a bone density study. Please obtain your COVID booster at the pharmacy.  INSTRUCTIONS: Follow up with the clinical pharmacist in one month to review your blood sugar readings and adjust the Ozempic dose if necessary. Your colonoscopy is scheduled with Dr. Abran on November 18th. Please obtain your COVID booster at the pharmacy.                      Contains text generated by [employer].                                 Contains text generated by [employer].

## 2024-05-20 ENCOUNTER — Encounter: Payer: Self-pay | Admitting: Internal Medicine

## 2024-05-22 NOTE — Telephone Encounter (Signed)
 Noted! Thank you

## 2024-05-22 NOTE — Telephone Encounter (Signed)
 Contacted patient. Patient scheduled for lab appointment for Hep B blood work this Friday.

## 2024-05-23 ENCOUNTER — Other Ambulatory Visit: Payer: Self-pay

## 2024-05-23 ENCOUNTER — Encounter: Payer: Self-pay | Admitting: Internal Medicine

## 2024-05-23 ENCOUNTER — Ambulatory Visit: Admitting: Internal Medicine

## 2024-05-23 VITALS — BP 130/70 | HR 69 | Ht 64.0 in | Wt 229.8 lb

## 2024-05-23 DIAGNOSIS — R109 Unspecified abdominal pain: Secondary | ICD-10-CM | POA: Diagnosis not present

## 2024-05-23 DIAGNOSIS — Z860100 Personal history of colon polyps, unspecified: Secondary | ICD-10-CM

## 2024-05-23 DIAGNOSIS — Z800 Family history of malignant neoplasm of digestive organs: Secondary | ICD-10-CM

## 2024-05-23 LAB — QUANTIFERON-TB GOLD PLUS
QuantiFERON Mitogen Value: >10 [IU]/mL
QuantiFERON Nil Value: 0.07 [IU]/mL
QuantiFERON TB1 Ag Value: 0.07 [IU]/mL
QuantiFERON TB2 Ag Value: 0.06 [IU]/mL
QuantiFERON-TB Gold Plus: NEGATIVE

## 2024-05-23 NOTE — Progress Notes (Signed)
 HISTORY OF PRESENT ILLNESS:  Heidi [name] is a 73 y.o. female with multiple significant medical problems including morbid obesity, diabetes mellitus, congestive heart failure, COPD on chronic oxygen  therapy, prior bilateral pneumonia, hypertension, hyperlipidemia, anxiety, asthma, and sleep apnea.  The patient presents today with concerns over colon cancer.  She reports some minor rectal bleeding and vague abdominal discomfort.  She is accompanied by her daughter.  Patient was last seen in this office March 05, 2021.  At that time she reported a change in stool color, possible blood in her stool, and requested colonoscopy.  Patient has a history of colon cancer in 2 first-degree relatives.  She has undergone multiple colonoscopies including 2015, 2018, and April 15, 2021.  Last examination revealed adenomatous colon polyps and internal hemorrhoids.  Otherwise normal.  Patient tells me that she may have seen some light-colored blood on the tissue paper several months ago.  She is experiencing some intermittent right sided abdominal discomfort.  Poor historian.  Wondering about colonoscopy.  She is high risk.  Laboratories from May 12, 2024 show normal hemoglobin 12.9.  Normal MCV 87.7.  REVIEW OF SYSTEMS:  All non-GI ROS negative except for anxiety, shortness of breath  Past Medical History:  Diagnosis Date   Adenomatous colon polyp    Anemia    Anxiety    Asthma    CHF (congestive heart failure) (HCC)    COPD (chronic obstructive pulmonary disease) (HCC)    Diabetes mellitus without complication (HCC)    High cholesterol    Hypertension    Left knee injury    Ocular hypertension 08/12/2022   OD   Pneumonia    Sleep apnea    Supplemental oxygen  dependent    2L [place]    Past Surgical History:  Procedure Laterality Date   ABDOMINAL HYSTERECTOMY     CATARACT EXTRACTION Left    CATARACT EXTRACTION     LT 01/2017, 02/2017 on RT   CESAREAN SECTION     COLONOSCOPY WITH  PROPOFOL  N/A 04/15/2021   Procedure: COLONOSCOPY WITH PROPOFOL ;  Surgeon: Abran Norleen SAILOR, MD;  Location: [facility];  Service: Endoscopy;  Laterality: N/A;  patient is on O2   JOINT REPLACEMENT Left    Plate in Ankle   LEFT HEART CATH AND CORONARY ANGIOGRAPHY N/A 03/04/2022   Procedure: LEFT HEART CATH AND CORONARY ANGIOGRAPHY;  Surgeon: Elmira Newman PARAS, MD;  Location: [facility];  Service: Cardiovascular;  Laterality: N/A;   POLYPECTOMY  04/15/2021   Procedure: POLYPECTOMY;  Surgeon: Abran Norleen SAILOR, MD;  Location: [facility];  Service: Endoscopy;;    Social History [name]  reports that she has never smoked. She has never been exposed to tobacco smoke. She has never used smokeless tobacco. She reports that she does not drink alcohol  and does not use drugs.  family history includes Colon cancer in her father and sister; Diabetes type II in her brother and sister.  Allergies  Allergen Reactions   Januvia  [Sitagliptin ]     Chest pains   Tramadol  Other (See Comments)    hallucinations       PHYSICAL EXAMINATION: Vital signs: BP 130/70   Pulse 69   Ht 5' 4 (1.626 m)   Wt 229 lb 12.8 oz (104.2 kg)   BMI 39.45 kg/m   Constitutional: Pleasant, unhealthy appearing, no acute distress Psychiatric: alert and oriented x3, cooperative Eyes: extraocular movements intact, anicteric, conjunctiva pink Mouth: oral pharynx moist, no lesions Neck: supple no lymphadenopathy  Cardiovascular: heart regular rate and rhythm, no murmur Lungs: clear to auscultation bilaterally Abdomen: soft, obese, mild right sided tenderness, nondistended, no obvious ascites, no peritoneal signs, normal bowel sounds, no organomegaly Rectal: External hemorrhoid tag.  Internal hemorrhoids.  No pain.  No mass.  Hemoccult negative brown stool Extremities: no clubbing or cyanosis.  1+ lower extremity edema bilaterally Skin: no lesions on visible extremities Neuro: No focal deficits.  Cranial  nerves intact  ASSESSMENT:  1.  Possible minor rectal bleeding.  Hemoccult negative stool.  Known hemorrhoids. 2.  Vague recurrent right sided abdominal pain.  Etiology unclear.  Investigate further. 3.  History of multiple adenomatous colon polyps.  Surveillance up-to-date 4.  Family history of colon cancer. 5.  Multiple significant comorbidities.  High risk for invasive procedures or surgery.   PLAN:  1.  Fiber supplementation 2.  Schedule CT scan of the abdomen pelvis.  This to evaluate recurrent pain 3.  Surveillance colonoscopy around October 2027.  This depending upon her overall health status 4.  Ongoing general medical care with Dr. Vicci A total time of 60 minutes was spent preparing to see the patient, obtaining comprehensive history, performing comprehensive physical exam, ordering advanced radiology study, counseling and educating the patient and her daughter regarding the above listed issues, and documenting clinical information in the health record

## 2024-05-23 NOTE — Patient Instructions (Signed)
 You have been scheduled for a CT scan of the abdomen and pelvis at [facility], 1st floor Radiology. You are scheduled on 06/02/2024 at 11:30am. You should arrive 15 minutes prior to your appointment time for registration.    You may take any medications as prescribed with a small amount of water, if necessary. If you take any of the following medications: METFORMIN , GLUCOPHAGE , GLUCOVANCE, AVANDAMET, RIOMET , FORTAMET , ACTOPLUS MET, JANUMET, GLUMETZA  or METAGLIP, you MAY be asked to HOLD this medication 48 hours AFTER the exam.    If you have any questions regarding your exam or if you need to reschedule, you may call [facility] at [phone] between the hours of 8:00 am and 5:00 pm, Monday-Friday.   _______________________________________________________  If your blood pressure at your visit was 140/90 or greater, please contact your primary care physician to follow up on this.  _______________________________________________________  If you are age 72 or older, your body mass index should be between 23-30. Your Body mass index is 39.45 kg/m. If this is out of the aforementioned range listed, please consider follow up with your Primary Care Provider.  If you are age 72 or younger, your body mass index should be between 19-25. Your Body mass index is 39.45 kg/m. If this is out of the aformentioned range listed, please consider follow up with your Primary Care Provider.   ________________________________________________________  The [facility] providers would like to encourage you to use MYCHART to communicate with providers for non-urgent requests or questions.  Due to long hold times on the telephone, sending your provider a message by [facility] may be a faster and more efficient way to get a response.  Please allow 48 business hours for a response.  Please remember that this is for non-urgent requests.  _______________________________________________________  [facility] is using a team-based approach to care.  Your team is made up of your doctor and two to three APPS. Our APPS (Nurse Practitioners and Physician Assistants) work with your physician to ensure care continuity for you. They are fully qualified to address your health concerns and develop a treatment plan. They communicate directly with your gastroenterologist to care for you. Seeing the Advanced Practice Practitioners on your physician's team can help you by facilitating care more promptly, often allowing for earlier appointments, access to diagnostic testing, procedures, and other specialty referrals.

## 2024-05-24 ENCOUNTER — Ambulatory Visit: Payer: Self-pay | Admitting: Internal Medicine

## 2024-05-25 ENCOUNTER — Other Ambulatory Visit: Payer: Self-pay | Admitting: Internal Medicine

## 2024-05-25 ENCOUNTER — Other Ambulatory Visit: Payer: Self-pay

## 2024-05-26 ENCOUNTER — Other Ambulatory Visit: Payer: Self-pay

## 2024-05-26 ENCOUNTER — Other Ambulatory Visit

## 2024-05-26 ENCOUNTER — Ambulatory Visit

## 2024-05-29 ENCOUNTER — Other Ambulatory Visit: Payer: Self-pay

## 2024-05-30 ENCOUNTER — Other Ambulatory Visit: Payer: Self-pay

## 2024-06-02 ENCOUNTER — Ambulatory Visit (HOSPITAL_COMMUNITY)

## 2024-06-05 ENCOUNTER — Other Ambulatory Visit: Payer: Self-pay

## 2024-06-06 ENCOUNTER — Other Ambulatory Visit: Payer: Self-pay

## 2024-06-07 ENCOUNTER — Other Ambulatory Visit: Payer: Self-pay

## 2024-06-11 ENCOUNTER — Other Ambulatory Visit: Payer: Self-pay | Admitting: Internal Medicine

## 2024-06-11 DIAGNOSIS — M545 Low back pain, unspecified: Secondary | ICD-10-CM

## 2024-06-16 ENCOUNTER — Other Ambulatory Visit: Payer: Self-pay | Admitting: Internal Medicine

## 2024-06-16 ENCOUNTER — Telehealth: Payer: Self-pay | Admitting: Internal Medicine

## 2024-06-16 ENCOUNTER — Ambulatory Visit: Admitting: Pharmacist

## 2024-06-16 DIAGNOSIS — E1169 Type 2 diabetes mellitus with other specified complication: Secondary | ICD-10-CM

## 2024-06-16 NOTE — Progress Notes (Signed)
 Ordered to test for respiratory infection

## 2024-06-16 NOTE — Telephone Encounter (Signed)
 Copied from CRM #[id number]. Topic: Clinical - Medication Question >> Jun 16, 2024 11:07 AM Delon DASEN wrote:  Reason for CRM: Patient received a letter that insulin  lispro protamine-lispro (HUMALOG  MIX 75/25) (75-25) 100 UNIT/ML SUSP injection will no longer be covered after the new year

## 2024-06-16 NOTE — Telephone Encounter (Signed)
 Please ask pt if when insulin  is changed, she would be okay with using the insulin  pen rather than the insulin  vials/bottles.  Looks like her insurance will cover for the pen.

## 2024-06-16 NOTE — Telephone Encounter (Signed)
 Called but no answer. LVM to call back.

## 2024-06-16 NOTE — Telephone Encounter (Addendum)
Sending as a Financial planner

## 2024-06-16 NOTE — Telephone Encounter (Signed)
 Will change to Novolog  or Humalog  70/30 insulin .

## 2024-06-19 ENCOUNTER — Telehealth: Payer: Self-pay

## 2024-06-19 ENCOUNTER — Ambulatory Visit (HOSPITAL_COMMUNITY): Attending: Internal Medicine

## 2024-06-19 NOTE — Telephone Encounter (Signed)
 Called but no answer. LVM to call back.

## 2024-06-19 NOTE — Telephone Encounter (Unsigned)
 Copied from CRM #[id number]. Topic: Clinical - Prescription Issue >> Jun 19, 2024  1:04 PM Nessti S wrote: Reason for CRM: pt cb to speak with nurse clarisa concerning insulin . She would like a cb soon as possible  [phone]

## 2024-06-20 ENCOUNTER — Other Ambulatory Visit: Payer: Self-pay

## 2024-06-20 NOTE — Telephone Encounter (Signed)
 Noted. Please see telephone encounter dated 06/16/24 for more information.

## 2024-06-20 NOTE — Telephone Encounter (Signed)
 Noted. Please see other telephone encounter 06/16/24 for more information.

## 2024-06-20 NOTE — Telephone Encounter (Signed)
 Called & spoke to the patient. Verified name & DOB. Informed that insulin  can be changed to Novolog  or Humalog  70/30 insulin . Patient would like most cost effective option. Additionally, she would like to keep using the vials and not the insulin  pen out of fear that the vost may be more. Explained to patient that her insurance covers but patient declined.   Patient would like to report that she has stopped taking the Semaglutide  due to some side effects such as weakness, increasing sugar cravings, reports that BS readings were not dropping. Herlene it looks like this patient has an upcoming appointment with you so sending this as an FYI.

## 2024-06-21 ENCOUNTER — Other Ambulatory Visit: Payer: Self-pay | Admitting: Internal Medicine

## 2024-06-21 ENCOUNTER — Other Ambulatory Visit: Payer: Self-pay

## 2024-06-21 NOTE — Telephone Encounter (Signed)
 Luke/Kelly: Can you please assist in figuring out what would be most cost effective for the patient.  She is currently on Humalog  75/25 insulin  taking 55 units in the mornings and 45 units in the evenings.  She states that come January 1 her insurance will no longer cover.  I seen NovoLog  70/30 as preferred but it is showing as preferred level 5 which to me suggest a high co-pay. Pt prefers to use vials.

## 2024-06-21 NOTE — Telephone Encounter (Signed)
 So we would have to wait until Jan 1st to run test claim to see what her insurance would cover?

## 2024-06-23 ENCOUNTER — Telehealth: Payer: Self-pay

## 2024-06-23 NOTE — Telephone Encounter (Signed)
 Called but no answer. LVM to call back. TB test completed on 05/2024. Results stapled to the back of her form. Please see below for lab notes dated  05/25/2024 where patient was informed of next steps. Patient was originally scheduled for lab work on 05/26/2024 for Hep B titer that is required by her job. Patient called to cancel and never rescheduled. Patient need to schedule lab work for Hep B titer. Otherwise, form can be left at the front desk but it will be incomplete.      Brent LOISE Crocker, CMA 05/25/2024  5:48 PM EST Back to Top    Late entry: Called & spoke to the patient on 05/24/24. Verified name & DOB. Informed of results & recommendations. Results stapled to the back of the form. Lab appointment scheduled for 05/26/24. Patient expressed verbal understanding of all discussed.    Calton JULIANNA Daring, RN 05/25/2024  4:50 PM EST     Call to patient to Advise per pcpcreening blood test for TB was negative. Please put results on pt's form then she can pick up. Remind her that we do not have documentation that she has had hepatitis B vaccine. Can stop at lab to have blood test to see if she has been vaccinated for hep B.  Unable to reach VM left

## 2024-06-23 NOTE — Telephone Encounter (Signed)
 Copied from CRM #[id number]. Topic: General - Other >> Jun 23, 2024 11:12 AM Berneda FALCON wrote: Reason for CRM: Daughter Mliss calling to confirm that patient had TB test done at last appt and if we can fax this information over for her. Called CAL because I was unable to confirm this on my end. Was informed last TB was in 2018. Patient and daughter are both insistent that she did get the TB test done on this day and want confirmation, please. She also needs the TB Test sent to the below fax number, please.  [phone] Please call back for confirmation [phone]

## 2024-06-26 ENCOUNTER — Telehealth: Payer: Self-pay | Admitting: Internal Medicine

## 2024-06-26 NOTE — Telephone Encounter (Signed)
 Called & spoke to Mliss (authorized to receive information per DPR on file). Informed that form is ready for pick-up and has been successfully faxed to [phone] per request. Upon Julie's request the form has been mailed to patient's home. No further assistance needed at this time.

## 2024-06-26 NOTE — Telephone Encounter (Signed)
 Called but no answer. LVM to call back. See message below.

## 2024-06-26 NOTE — Telephone Encounter (Signed)
 Copied from CRM #[id number]. Topic: Referral - Question >> Jun 26, 2024  1:21 PM Heidi [name] wrote:  Reason for CRM: patient called in wants referral for her thyroid  to be sent to [facility] imaging instead of where previus referral was sent

## 2024-06-26 NOTE — Telephone Encounter (Signed)
 Thank you,   I have contacted the patient and informed her about the referral.

## 2024-06-26 NOTE — Telephone Encounter (Signed)
 Hi Heidi [name],  Can you please update the referral and send it according to the patients request?

## 2024-06-26 NOTE — Telephone Encounter (Unsigned)
 Copied from CRM #[id number]. Topic: General - Other >> Jun 26, 2024 10:48 AM Tinnie BROCKS wrote: Daughter Mliss calling back regarding this request. I let her know that we do have the TB test results but not Hep B. She says the hep b test was cancelled because they found out an old test she took would work, so we are okay to only put TB results on form and leave the hep b space blank. She is now requesting this form be faxed to Fax-[phone] Please let daughter know once complete at [phone] or mom

## 2024-07-08 ENCOUNTER — Other Ambulatory Visit: Payer: Self-pay | Admitting: Internal Medicine

## 2024-07-08 MED ORDER — INSULIN ASPART PROT & ASPART (70-30 MIX) 100 UNIT/ML ~~LOC~~ SUSP
SUBCUTANEOUS | 11 refills | Status: AC
  Filled 2024-07-08: qty 30, 31d supply, fill #0
  Filled 2024-08-02: qty 30, 31d supply, fill #1

## 2024-07-08 NOTE — Telephone Encounter (Signed)
 Burnard, now that it is 2026, can you please run the Novolog  70/30 to see if it is covere by this pt's insurance? Thanks.

## 2024-07-10 ENCOUNTER — Other Ambulatory Visit: Payer: Self-pay

## 2024-07-10 ENCOUNTER — Ambulatory Visit: Admitting: Pharmacist

## 2024-07-11 ENCOUNTER — Other Ambulatory Visit: Payer: Self-pay

## 2024-07-12 NOTE — Telephone Encounter (Signed)
 Called but no answer. LVM to call back.

## 2024-07-13 NOTE — Telephone Encounter (Signed)
 Called but no answer. LVM to call back.

## 2024-07-14 ENCOUNTER — Other Ambulatory Visit: Payer: Self-pay

## 2024-07-14 NOTE — Telephone Encounter (Signed)
 Called & spoke to the patient. Verified name & DOB. Informed that Novolog  is covered by the patient's insurance and will cost $12.65. Patient stated that she has already picked up the medication. Patient expressed verbal understanding of all discussed.

## 2024-07-17 ENCOUNTER — Other Ambulatory Visit: Payer: Self-pay

## 2024-07-18 ENCOUNTER — Other Ambulatory Visit: Payer: Self-pay

## 2024-07-18 ENCOUNTER — Other Ambulatory Visit: Payer: Self-pay | Admitting: Internal Medicine

## 2024-07-18 DIAGNOSIS — E1169 Type 2 diabetes mellitus with other specified complication: Secondary | ICD-10-CM

## 2024-07-19 ENCOUNTER — Other Ambulatory Visit: Payer: Self-pay

## 2024-07-21 ENCOUNTER — Other Ambulatory Visit: Payer: Self-pay

## 2024-07-27 ENCOUNTER — Other Ambulatory Visit: Payer: Self-pay

## 2024-07-28 ENCOUNTER — Other Ambulatory Visit: Payer: Self-pay

## 2024-08-02 ENCOUNTER — Other Ambulatory Visit: Payer: Self-pay

## 2024-08-04 ENCOUNTER — Telehealth: Payer: Self-pay | Admitting: Internal Medicine

## 2024-08-04 NOTE — Telephone Encounter (Signed)
 Left message to call back

## 2024-08-04 NOTE — Telephone Encounter (Signed)
 Good afternoon Dr. Abran  The following patient called in about her colonoscopy. She has a recall for 04/2026 but was told years ago it needs to be done every two years due to family history and she feels uncomfortable waiting five years. Please review and advise of scheduling.   Thank you.

## 2024-08-04 NOTE — Telephone Encounter (Signed)
 Patient is advised of Dr Nancyann recommendation to keep 5 year recall as previously recommended (04/2026) in keeping with AGA guidelines and previous history.  Patient states that she was told by her doctor at [facility] in [place] back in 2015 that she was to have a colonoscopy every 2 years because of her history of polyps and family history of colon cancer. States he told her that it was up to her to make sure this happened. I reviewed the 2015 notes (see under media tab in EPIC) from [facility] that indicate she had 1 adenomatous polyp, 4 hyperplastic polyps and recommendation for next colonoscopy in 3 years. Explained that this does not mean she was to have repeat colonoscopy every 3 years from that point forward, just that she needed one 3 years from that date. Additional follow up recommendations would be based of the 2018 colonoscopy findings. Patient is adamant that she needs procedures every 2 years. I advised that Dr Abran is comfortable following guidelines as per AGA and after extensive review of her records. She verbalizes understanding.

## 2024-08-04 NOTE — Telephone Encounter (Signed)
 Chart reviewed. Keep 5 year follow up recommended and in keeping with the guidelines. Repeat due 04-2026. Please let patient know.

## 2024-08-09 ENCOUNTER — Other Ambulatory Visit: Payer: Self-pay | Admitting: Internal Medicine

## 2024-08-09 DIAGNOSIS — M545 Low back pain, unspecified: Secondary | ICD-10-CM

## 2024-09-18 ENCOUNTER — Ambulatory Visit: Admitting: Internal Medicine

## 2024-10-23 ENCOUNTER — Ambulatory Visit: Admitting: Neurology

## 2024-10-24 ENCOUNTER — Ambulatory Visit: Admitting: Neurology

## 2025-04-03 ENCOUNTER — Ambulatory Visit
